# Patient Record
Sex: Male | Born: 1945 | Race: White | Hispanic: No | Marital: Married | Smoking: Former smoker
Health system: Southern US, Community
[De-identification: ages and names within clinical notes are randomized; demographics above are authoritative.]

## PROBLEM LIST (undated history)

## (undated) DIAGNOSIS — F319 Bipolar disorder, unspecified: Secondary | ICD-10-CM

## (undated) DIAGNOSIS — G4700 Insomnia, unspecified: Secondary | ICD-10-CM

## (undated) DIAGNOSIS — Z860100 Personal history of colon polyps, unspecified: Secondary | ICD-10-CM

## (undated) DIAGNOSIS — D62 Acute posthemorrhagic anemia: Secondary | ICD-10-CM

## (undated) DIAGNOSIS — R519 Headache, unspecified: Secondary | ICD-10-CM

## (undated) DIAGNOSIS — J45909 Unspecified asthma, uncomplicated: Secondary | ICD-10-CM

## (undated) DIAGNOSIS — R4586 Emotional lability: Secondary | ICD-10-CM

## (undated) DIAGNOSIS — I639 Cerebral infarction, unspecified: Secondary | ICD-10-CM

## (undated) DIAGNOSIS — I10 Essential (primary) hypertension: Secondary | ICD-10-CM

## (undated) DIAGNOSIS — Z8709 Personal history of other diseases of the respiratory system: Secondary | ICD-10-CM

## (undated) DIAGNOSIS — I6789 Other cerebrovascular disease: Secondary | ICD-10-CM

## (undated) DIAGNOSIS — R0602 Shortness of breath: Secondary | ICD-10-CM

## (undated) DIAGNOSIS — R251 Tremor, unspecified: Secondary | ICD-10-CM

## (undated) DIAGNOSIS — F419 Anxiety disorder, unspecified: Secondary | ICD-10-CM

## (undated) DIAGNOSIS — T783XXA Angioneurotic edema, initial encounter: Secondary | ICD-10-CM

## (undated) DIAGNOSIS — Z9981 Dependence on supplemental oxygen: Secondary | ICD-10-CM

## (undated) DIAGNOSIS — G8929 Other chronic pain: Secondary | ICD-10-CM

## (undated) DIAGNOSIS — C3490 Malignant neoplasm of unspecified part of unspecified bronchus or lung: Secondary | ICD-10-CM

## (undated) DIAGNOSIS — F32A Depression, unspecified: Secondary | ICD-10-CM

## (undated) DIAGNOSIS — G709 Myoneural disorder, unspecified: Secondary | ICD-10-CM

## (undated) DIAGNOSIS — I219 Acute myocardial infarction, unspecified: Secondary | ICD-10-CM

## (undated) DIAGNOSIS — K219 Gastro-esophageal reflux disease without esophagitis: Secondary | ICD-10-CM

## (undated) DIAGNOSIS — Z8546 Personal history of malignant neoplasm of prostate: Secondary | ICD-10-CM

## (undated) DIAGNOSIS — K5900 Constipation, unspecified: Secondary | ICD-10-CM

## (undated) DIAGNOSIS — Z8669 Personal history of other diseases of the nervous system and sense organs: Secondary | ICD-10-CM

## (undated) DIAGNOSIS — E785 Hyperlipidemia, unspecified: Secondary | ICD-10-CM

## (undated) DIAGNOSIS — J690 Pneumonitis due to inhalation of food and vomit: Secondary | ICD-10-CM

## (undated) DIAGNOSIS — J439 Emphysema, unspecified: Secondary | ICD-10-CM

## (undated) DIAGNOSIS — R51 Headache: Secondary | ICD-10-CM

## (undated) DIAGNOSIS — I2510 Atherosclerotic heart disease of native coronary artery without angina pectoris: Secondary | ICD-10-CM

## (undated) DIAGNOSIS — M549 Dorsalgia, unspecified: Secondary | ICD-10-CM

## (undated) DIAGNOSIS — F329 Major depressive disorder, single episode, unspecified: Secondary | ICD-10-CM

## (undated) DIAGNOSIS — R7881 Bacteremia: Secondary | ICD-10-CM

## (undated) HISTORY — DX: Essential (primary) hypertension: I10

## (undated) HISTORY — DX: Hyperlipidemia, unspecified: E78.5

## (undated) HISTORY — DX: Headache, unspecified: R51.9

## (undated) HISTORY — DX: Acute myocardial infarction, unspecified: I21.9

## (undated) HISTORY — DX: Malignant neoplasm of unspecified part of unspecified bronchus or lung: C34.90

## (undated) HISTORY — PX: DG BIOPSY LUNG: HXRAD146

## (undated) HISTORY — DX: Depression, unspecified: F32.A

## (undated) HISTORY — DX: Major depressive disorder, single episode, unspecified: F32.9

## (undated) HISTORY — DX: Tremor, unspecified: R25.1

## (undated) HISTORY — DX: Personal history of malignant neoplasm of prostate: Z85.46

## (undated) HISTORY — PX: COLONOSCOPY: SHX174

## (undated) HISTORY — DX: Cerebral infarction, unspecified: I63.9

## (undated) HISTORY — DX: Emphysema, unspecified: J43.9

## (undated) HISTORY — DX: Headache: R51

## (undated) HISTORY — PX: SPINE SURGERY: SHX786

## (undated) HISTORY — PX: BACK SURGERY: SHX140

---

## 1987-02-11 HISTORY — PX: HAND SURGERY: SHX662

## 1994-02-10 HISTORY — PX: CHOLECYSTECTOMY: SHX55

## 1996-02-11 DIAGNOSIS — G709 Myoneural disorder, unspecified: Secondary | ICD-10-CM

## 1996-02-11 DIAGNOSIS — I639 Cerebral infarction, unspecified: Secondary | ICD-10-CM

## 1996-02-11 HISTORY — PX: ULNAR NERVE REPAIR: SHX2594

## 1996-02-11 HISTORY — PX: CARPAL TUNNEL RELEASE: SHX101

## 1996-02-11 HISTORY — DX: Cerebral infarction, unspecified: I63.9

## 1996-02-11 HISTORY — DX: Myoneural disorder, unspecified: G70.9

## 1997-02-10 HISTORY — PX: BRAIN SURGERY: SHX531

## 1997-02-10 HISTORY — PX: CRANIOTOMY: SHX93

## 1997-07-18 ENCOUNTER — Ambulatory Visit (HOSPITAL_COMMUNITY): Admission: RE | Admit: 1997-07-18 | Discharge: 1997-07-18 | Payer: Self-pay

## 1997-07-25 ENCOUNTER — Ambulatory Visit (HOSPITAL_COMMUNITY): Admission: RE | Admit: 1997-07-25 | Discharge: 1997-07-25 | Payer: Self-pay | Admitting: Neurosurgery

## 1997-08-30 ENCOUNTER — Inpatient Hospital Stay (HOSPITAL_COMMUNITY): Admission: RE | Admit: 1997-08-30 | Discharge: 1997-09-01 | Payer: Self-pay | Admitting: Neurosurgery

## 1998-02-10 HISTORY — PX: CORONARY ANGIOPLASTY WITH STENT PLACEMENT: SHX49

## 1998-04-11 DIAGNOSIS — I219 Acute myocardial infarction, unspecified: Secondary | ICD-10-CM

## 1998-04-11 HISTORY — DX: Acute myocardial infarction, unspecified: I21.9

## 1998-04-11 HISTORY — PX: CORONARY ANGIOPLASTY WITH STENT PLACEMENT: SHX49

## 1998-04-15 ENCOUNTER — Inpatient Hospital Stay (HOSPITAL_COMMUNITY): Admission: EM | Admit: 1998-04-15 | Discharge: 1998-04-17 | Payer: Self-pay | Admitting: Emergency Medicine

## 1998-04-15 ENCOUNTER — Encounter: Payer: Self-pay | Admitting: Cardiovascular Disease

## 1998-05-01 ENCOUNTER — Encounter (HOSPITAL_COMMUNITY): Admission: RE | Admit: 1998-05-01 | Discharge: 1998-07-30 | Payer: Self-pay | Admitting: Cardiovascular Disease

## 2001-04-10 HISTORY — PX: PROSTATECTOMY: SHX69

## 2001-04-26 ENCOUNTER — Encounter: Payer: Self-pay | Admitting: Urology

## 2001-05-03 ENCOUNTER — Encounter (INDEPENDENT_AMBULATORY_CARE_PROVIDER_SITE_OTHER): Payer: Self-pay

## 2001-05-03 ENCOUNTER — Inpatient Hospital Stay (HOSPITAL_COMMUNITY): Admission: RE | Admit: 2001-05-03 | Discharge: 2001-05-06 | Payer: Self-pay | Admitting: Urology

## 2001-07-25 ENCOUNTER — Encounter: Payer: Self-pay | Admitting: Internal Medicine

## 2001-07-25 ENCOUNTER — Emergency Department (HOSPITAL_COMMUNITY): Admission: EM | Admit: 2001-07-25 | Discharge: 2001-07-26 | Payer: Self-pay

## 2001-07-26 ENCOUNTER — Ambulatory Visit (HOSPITAL_COMMUNITY): Admission: RE | Admit: 2001-07-26 | Discharge: 2001-07-26 | Payer: Self-pay | Admitting: Gastroenterology

## 2002-02-10 DIAGNOSIS — Z8546 Personal history of malignant neoplasm of prostate: Secondary | ICD-10-CM

## 2002-02-10 HISTORY — DX: Personal history of malignant neoplasm of prostate: Z85.46

## 2002-05-02 ENCOUNTER — Encounter: Admission: RE | Admit: 2002-05-02 | Discharge: 2002-05-02 | Payer: Self-pay | Admitting: Emergency Medicine

## 2002-05-02 ENCOUNTER — Encounter: Payer: Self-pay | Admitting: Emergency Medicine

## 2002-11-02 ENCOUNTER — Inpatient Hospital Stay (HOSPITAL_COMMUNITY): Admission: EM | Admit: 2002-11-02 | Discharge: 2002-11-06 | Payer: Self-pay | Admitting: Psychiatry

## 2002-11-22 ENCOUNTER — Encounter: Admission: RE | Admit: 2002-11-22 | Discharge: 2002-11-22 | Payer: Self-pay | Admitting: Emergency Medicine

## 2002-11-22 ENCOUNTER — Encounter: Payer: Self-pay | Admitting: Emergency Medicine

## 2003-02-28 ENCOUNTER — Encounter: Admission: RE | Admit: 2003-02-28 | Discharge: 2003-02-28 | Payer: Self-pay | Admitting: Emergency Medicine

## 2003-08-30 ENCOUNTER — Encounter: Admission: RE | Admit: 2003-08-30 | Discharge: 2003-08-30 | Payer: Self-pay | Admitting: Emergency Medicine

## 2003-09-04 ENCOUNTER — Encounter: Admission: RE | Admit: 2003-09-04 | Discharge: 2003-09-04 | Payer: Self-pay | Admitting: Emergency Medicine

## 2003-09-19 ENCOUNTER — Encounter: Admission: RE | Admit: 2003-09-19 | Discharge: 2003-09-19 | Payer: Self-pay | Admitting: Emergency Medicine

## 2003-09-27 ENCOUNTER — Encounter: Admission: RE | Admit: 2003-09-27 | Discharge: 2003-09-27 | Payer: Self-pay | Admitting: Emergency Medicine

## 2004-06-27 ENCOUNTER — Encounter: Admission: RE | Admit: 2004-06-27 | Discharge: 2004-06-27 | Payer: Self-pay | Admitting: Emergency Medicine

## 2004-07-28 ENCOUNTER — Encounter: Admission: RE | Admit: 2004-07-28 | Discharge: 2004-07-28 | Payer: Self-pay | Admitting: Neurosurgery

## 2004-08-14 ENCOUNTER — Inpatient Hospital Stay (HOSPITAL_COMMUNITY): Admission: RE | Admit: 2004-08-14 | Discharge: 2004-08-15 | Payer: Self-pay | Admitting: Neurosurgery

## 2004-12-20 ENCOUNTER — Encounter: Admission: RE | Admit: 2004-12-20 | Discharge: 2004-12-20 | Payer: Self-pay | Admitting: Emergency Medicine

## 2005-01-12 ENCOUNTER — Encounter: Admission: RE | Admit: 2005-01-12 | Discharge: 2005-01-12 | Payer: Self-pay | Admitting: Neurosurgery

## 2005-02-25 ENCOUNTER — Ambulatory Visit (HOSPITAL_COMMUNITY): Admission: RE | Admit: 2005-02-25 | Discharge: 2005-02-26 | Payer: Self-pay | Admitting: Neurosurgery

## 2005-09-17 ENCOUNTER — Encounter: Admission: RE | Admit: 2005-09-17 | Discharge: 2005-09-17 | Payer: Self-pay | Admitting: Emergency Medicine

## 2005-09-22 ENCOUNTER — Ambulatory Visit (HOSPITAL_COMMUNITY): Admission: RE | Admit: 2005-09-22 | Discharge: 2005-09-22 | Payer: Self-pay | Admitting: Neurology

## 2005-10-09 ENCOUNTER — Encounter: Admission: RE | Admit: 2005-10-09 | Discharge: 2005-10-09 | Payer: Self-pay | Admitting: Neurology

## 2006-02-07 ENCOUNTER — Encounter: Admission: RE | Admit: 2006-02-07 | Discharge: 2006-02-07 | Payer: Self-pay | Admitting: Emergency Medicine

## 2006-02-10 HISTORY — PX: GASTROSTOMY W/ FEEDING TUBE: SUR642

## 2006-04-24 ENCOUNTER — Inpatient Hospital Stay (HOSPITAL_COMMUNITY): Admission: RE | Admit: 2006-04-24 | Discharge: 2006-04-27 | Payer: Self-pay | Admitting: Neurosurgery

## 2006-04-27 ENCOUNTER — Ambulatory Visit: Payer: Self-pay | Admitting: Physical Medicine & Rehabilitation

## 2006-07-02 ENCOUNTER — Encounter: Admission: RE | Admit: 2006-07-02 | Discharge: 2006-07-02 | Payer: Self-pay | Admitting: Neurosurgery

## 2006-08-20 ENCOUNTER — Encounter: Admission: RE | Admit: 2006-08-20 | Discharge: 2006-08-20 | Payer: Self-pay | Admitting: Gastroenterology

## 2006-08-24 ENCOUNTER — Ambulatory Visit (HOSPITAL_COMMUNITY): Admission: RE | Admit: 2006-08-24 | Discharge: 2006-08-24 | Payer: Self-pay | Admitting: Gastroenterology

## 2006-09-30 ENCOUNTER — Inpatient Hospital Stay (HOSPITAL_COMMUNITY): Admission: RE | Admit: 2006-09-30 | Discharge: 2006-10-03 | Payer: Self-pay | Admitting: Psychiatry

## 2006-09-30 ENCOUNTER — Ambulatory Visit: Payer: Self-pay | Admitting: Psychiatry

## 2006-11-03 ENCOUNTER — Ambulatory Visit (HOSPITAL_COMMUNITY): Admission: RE | Admit: 2006-11-03 | Discharge: 2006-11-05 | Payer: Self-pay | Admitting: Gastroenterology

## 2007-02-23 ENCOUNTER — Inpatient Hospital Stay (HOSPITAL_COMMUNITY): Admission: EM | Admit: 2007-02-23 | Discharge: 2007-02-25 | Payer: Self-pay | Admitting: Emergency Medicine

## 2007-04-13 ENCOUNTER — Inpatient Hospital Stay (HOSPITAL_COMMUNITY): Admission: EM | Admit: 2007-04-13 | Discharge: 2007-04-15 | Payer: Self-pay | Admitting: Emergency Medicine

## 2007-06-02 ENCOUNTER — Encounter: Admission: RE | Admit: 2007-06-02 | Discharge: 2007-06-02 | Payer: Self-pay | Admitting: Neurosurgery

## 2007-06-25 ENCOUNTER — Inpatient Hospital Stay (HOSPITAL_COMMUNITY): Admission: RE | Admit: 2007-06-25 | Discharge: 2007-06-30 | Payer: Self-pay | Admitting: Neurosurgery

## 2007-06-28 ENCOUNTER — Ambulatory Visit: Payer: Self-pay | Admitting: Physical Medicine & Rehabilitation

## 2007-11-03 ENCOUNTER — Encounter: Admission: RE | Admit: 2007-11-03 | Discharge: 2007-12-20 | Payer: Self-pay | Admitting: Neurosurgery

## 2008-02-11 DIAGNOSIS — J690 Pneumonitis due to inhalation of food and vomit: Secondary | ICD-10-CM

## 2008-02-11 HISTORY — DX: Pneumonitis due to inhalation of food and vomit: J69.0

## 2008-04-11 ENCOUNTER — Observation Stay (HOSPITAL_COMMUNITY): Admission: AD | Admit: 2008-04-11 | Discharge: 2008-04-12 | Payer: Self-pay | Admitting: Gastroenterology

## 2008-04-12 ENCOUNTER — Encounter (INDEPENDENT_AMBULATORY_CARE_PROVIDER_SITE_OTHER): Payer: Self-pay | Admitting: Gastroenterology

## 2008-08-16 ENCOUNTER — Encounter: Admission: RE | Admit: 2008-08-16 | Discharge: 2008-08-16 | Payer: Self-pay | Admitting: Emergency Medicine

## 2009-04-04 ENCOUNTER — Encounter: Admission: RE | Admit: 2009-04-04 | Discharge: 2009-04-04 | Payer: Self-pay | Admitting: Neurosurgery

## 2009-05-08 ENCOUNTER — Encounter: Payer: Self-pay | Admitting: Pulmonary Disease

## 2009-05-15 ENCOUNTER — Encounter: Payer: Self-pay | Admitting: Pulmonary Disease

## 2009-07-16 ENCOUNTER — Inpatient Hospital Stay (HOSPITAL_COMMUNITY): Admission: RE | Admit: 2009-07-16 | Discharge: 2009-07-18 | Payer: Self-pay | Admitting: Neurosurgery

## 2009-08-22 ENCOUNTER — Encounter: Admission: RE | Admit: 2009-08-22 | Discharge: 2009-08-22 | Payer: Self-pay | Admitting: Neurosurgery

## 2009-10-18 ENCOUNTER — Encounter (INDEPENDENT_AMBULATORY_CARE_PROVIDER_SITE_OTHER): Payer: Self-pay | Admitting: *Deleted

## 2009-10-18 LAB — CONVERTED CEMR LAB
Albumin: 4.6 g/dL
Alkaline Phosphatase: 61 units/L
Calcium: 9.6 mg/dL
Creatinine, Ser: 0.87 mg/dL
HDL: 65 mg/dL
LDL Cholesterol: 106 mg/dL
PSA: 0 ng/mL
Potassium, serum: 4.6 mmol/L
Sodium, serum: 135 mmol/L
Total Protein: 7.1 g/dL

## 2009-12-10 ENCOUNTER — Encounter: Payer: Self-pay | Admitting: Pulmonary Disease

## 2009-12-10 ENCOUNTER — Encounter (INDEPENDENT_AMBULATORY_CARE_PROVIDER_SITE_OTHER): Payer: Self-pay | Admitting: *Deleted

## 2009-12-10 ENCOUNTER — Encounter: Payer: Self-pay | Admitting: Family Medicine

## 2009-12-10 ENCOUNTER — Encounter: Admission: RE | Admit: 2009-12-10 | Discharge: 2009-12-10 | Payer: Self-pay | Admitting: Emergency Medicine

## 2009-12-10 LAB — CONVERTED CEMR LAB
ALT: 18 units/L
Albumin: 4.3 g/dL
Alkaline Phosphatase: 52 units/L
BUN: 0.91 mg/dL
CO2, serum: 29 mmol/L
Chloride, Serum: 98 mmol/L
Creatinine, Ser: 11 mg/dL
MCV: 88.4 fL
Potassium, serum: 5.4 mmol/L
RBC count: 4.84 10*6/uL
Sodium, serum: 135 mmol/L
Total Bilirubin: 0.3 mg/dL
WBC, blood: 11.5 10*3/uL

## 2010-01-02 ENCOUNTER — Encounter: Payer: Self-pay | Admitting: Pulmonary Disease

## 2010-01-09 ENCOUNTER — Encounter: Payer: Self-pay | Admitting: Pulmonary Disease

## 2010-02-13 ENCOUNTER — Ambulatory Visit
Admission: RE | Admit: 2010-02-13 | Discharge: 2010-02-13 | Payer: Self-pay | Source: Home / Self Care | Attending: Family Medicine | Admitting: Family Medicine

## 2010-02-13 DIAGNOSIS — R51 Headache: Secondary | ICD-10-CM

## 2010-02-13 DIAGNOSIS — F329 Major depressive disorder, single episode, unspecified: Secondary | ICD-10-CM | POA: Insufficient documentation

## 2010-02-13 DIAGNOSIS — J45909 Unspecified asthma, uncomplicated: Secondary | ICD-10-CM | POA: Insufficient documentation

## 2010-02-13 DIAGNOSIS — Z8546 Personal history of malignant neoplasm of prostate: Secondary | ICD-10-CM | POA: Insufficient documentation

## 2010-02-13 DIAGNOSIS — B379 Candidiasis, unspecified: Secondary | ICD-10-CM | POA: Insufficient documentation

## 2010-02-13 DIAGNOSIS — Z8673 Personal history of transient ischemic attack (TIA), and cerebral infarction without residual deficits: Secondary | ICD-10-CM | POA: Insufficient documentation

## 2010-02-13 DIAGNOSIS — E785 Hyperlipidemia, unspecified: Secondary | ICD-10-CM | POA: Insufficient documentation

## 2010-02-13 DIAGNOSIS — I10 Essential (primary) hypertension: Secondary | ICD-10-CM | POA: Insufficient documentation

## 2010-02-13 DIAGNOSIS — F319 Bipolar disorder, unspecified: Secondary | ICD-10-CM | POA: Insufficient documentation

## 2010-02-13 DIAGNOSIS — R259 Unspecified abnormal involuntary movements: Secondary | ICD-10-CM | POA: Insufficient documentation

## 2010-02-20 ENCOUNTER — Ambulatory Visit
Admission: RE | Admit: 2010-02-20 | Discharge: 2010-02-20 | Payer: Self-pay | Source: Home / Self Care | Attending: Pulmonary Disease | Admitting: Pulmonary Disease

## 2010-02-20 DIAGNOSIS — I6789 Other cerebrovascular disease: Secondary | ICD-10-CM | POA: Insufficient documentation

## 2010-02-20 DIAGNOSIS — J438 Other emphysema: Secondary | ICD-10-CM | POA: Insufficient documentation

## 2010-02-20 DIAGNOSIS — I219 Acute myocardial infarction, unspecified: Secondary | ICD-10-CM | POA: Insufficient documentation

## 2010-02-20 HISTORY — DX: Other cerebrovascular disease: I67.89

## 2010-02-25 ENCOUNTER — Telehealth: Payer: Self-pay | Admitting: Pulmonary Disease

## 2010-02-26 ENCOUNTER — Encounter: Payer: Self-pay | Admitting: Pulmonary Disease

## 2010-03-03 ENCOUNTER — Encounter: Payer: Self-pay | Admitting: Emergency Medicine

## 2010-03-05 ENCOUNTER — Telehealth (INDEPENDENT_AMBULATORY_CARE_PROVIDER_SITE_OTHER): Payer: Self-pay | Admitting: *Deleted

## 2010-03-08 ENCOUNTER — Encounter: Payer: Self-pay | Admitting: Pulmonary Disease

## 2010-03-11 ENCOUNTER — Telehealth: Payer: Self-pay | Admitting: Pulmonary Disease

## 2010-03-14 NOTE — Letter (Signed)
Summary: Leslee Home MD  Leslee Home MD   Imported By: Sherian Rein 02/26/2010 12:37:18  _____________________________________________________________________  External Attachment:    Type:   Image     Comment:   External Document

## 2010-03-14 NOTE — Assessment & Plan Note (Signed)
Summary: consult for management of emphysema   Visit Type:  Initial Consult Copy to:  Leslee Home MD/ Garnette Scheuermann MD Primary Provider/Referring Provider:  Neena Rhymes MD  CC:  Pulmonary Consult. pt c/o cough w/ occas yellow to brown phlem, congestion, and sob w/ activity x 3 months.  History of Present Illness: The pt is a 64y/o male who I have been asked to see for management of copd.   He has known severe obstructive disease from spirometry on 04/2009, and unfortunately continues to smoke.  He is being treated with spiriva, symbicort, and as needed nebulized bronchodilators.  He has chronic cough with mucus that varies from clear to discolored, along with chronic chest congestion.  The pt gets sob with any exertion, and his wife states that he "lives in a recliner".  The pt has severe chronic back pain along with sigificant LE weakness, and this interferes with his ambulatory tolerance.  He has rare sob at rest.  He has had a recent stress test with Dr. Katrinka Blazing, and tells me it was ok.  He has had a recent cxr in Oct which showed hyperinflation but no acute process.  Preventive Screening-Counseling & Management  Alcohol-Tobacco     Smoking Status: current     Smoking Cessation Counseling: yes     Packs/Day: 1.0     Tobacco Counseling: to quit use of tobacco products  Current Medications (verified): 1)  Ipratropium-Albuterol 0.5-2.5 (3) Mg/5ml Soln (Ipratropium-Albuterol) .... Use Once Via Nebullizer 4 Times A Day 2)  Ventolin Hfa 108 (90 Base) Mcg/act Aers (Albuterol Sulfate) .... Inhale 2 Puffs Qid As Needed 3)  Vesicare 5 Mg Tabs (Solifenacin Succinate) .... Take One Tablet Daily 4)  Hydrocodone-Acetaminophen 5-500 Mg Tabs (Hydrocodone-Acetaminophen) .... Take One Tablet 4 Times A Daily As Needed 5)  Symbicort 160-4.5 Mcg/act Aero (Budesonide-Formoterol Fumarate) .... Use Daily 6)  Spiriva Handihaler 18 Mcg Caps (Tiotropium Bromide Monohydrate) .... Inhale The Contents of 1 Capsule  Daily 7)  Fluticasone Propionate 50 Mcg/act Susp (Fluticasone Propionate) .... Use 2 Sprays Into Nostril Daily 8)  Lactulose 10 Gm/42ml Soln (Lactulose) .... Take One Teaspoonful Three Times A Day 9)  Vitamin D3 1000 Unit Caps (Cholecalciferol) .... Take One Tablet Daily 10)  Omeprazole 40 Mg Cpdr (Omeprazole) .... Take One Tablet Two Times A Day 11)  Hydrocodone-Acetaminophen 10-325 Mg/52ml Soln (Hydrocodone-Acetaminophen) .... Use 1 Teaspoonful Every 12 Hours As Needed 12)  Metoprolol Succinate 100 Mg Xr24h-Tab (Metoprolol Succinate) .... Take One Tablet Daily 13)  Diazepam 5 Mg Tabs (Diazepam) .... Take 1-2 Tablets By Mouth Every 6 Hours As Needed 14)  Prednisone 10 Mg Tabs (Prednisone) .... Take One Tablet Daily 15)  Divalproex Sodium 500 Mg Xr24h-Tab (Divalproex Sodium) .... Take 2 Tablets At Bedtime 16)  Vitamin C 500 Mg Tabs (Ascorbic Acid) .... Take One Tablet Daily 17)  Primidone 250 Mg Tabs (Primidone) .... Take One Tablet Two Times A Day 18)  Fish Oil 1000 Mg Caps (Omega-3 Fatty Acids) .... Take One Tablet Daily 19)  Zinc 50 Mg Tabs (Zinc) .... Take One Tablet Daily 20)  Abilify 10 Mg Tabs (Aripiprazole) .... Take 1/2-1 Tablet Daily 21)  Simvastatin 20 Mg Tabs (Simvastatin) .... Take One Tablet At Bedtime 22)  Trazodone Hcl 100 Mg Tabs (Trazodone Hcl) .... Take One Tablet At Bedtime 23)  Oxycodone-Acetaminophen 5-325 Mg Tabs (Oxycodone-Acetaminophen) .... Take One Tablet Every 6 Hours As Needed 24)  Px Enteric Aspirin 81 Mg Tbec (Aspirin) .... Tablet Daily 25)  Paroxetine Hcl  20 Mg Tabs (Paroxetine Hcl) .... Take 3 Tablets Daily 26)  Miconazole Nitrate 2 % Crea (Miconazole Nitrate) .... Apply To Affected Area Two Times A Day As Needed For Rash 27)  Nystatin 100000 Unit/gm Powd (Nystatin) .... Apply To Affected Area Two Times A Day.  Disp 1 Container 28)  Prednisone 10 Mg Tabs (Prednisone) .... Once Daily 29)  Doxycycline Hyclate 100 Mg Tabs (Doxycycline Hyclate) .Marland Kitchen.. 1 Two Times A  Day  Allergies (verified): No Known Drug Allergies  Past History:  Past Medical History:   Hx of MYOCARDIAL INFARCTION (ICD-410.90) C V A / STROKE (ICD-436) HEADACHE, CHRONIC, HX OF (ICD-V15.9) TREMOR (ICD-781.0) CEREBROVASCULAR ACCIDENT, HX OF (ICD-V12.50) HYPERTENSION (ICD-401.9) HYPERLIPIDEMIA (ICD-272.4) DEPRESSION (ICD-311) Severe emphysema---FEV1 47% pred, FEV1% 49 (04/2009)  hx of prostate cancer  Past Surgical History: back surgery x 5 Cholecystectomy  moved left arm ulna nerve over craniotomy to clip aneurism vascular stent prostate cancer removed  Family History: Reviewed history and no changes required. emphysema: mother heart disease: mother brain cancer: father prostate cancer: father  Social History: Reviewed history and no changes required. married Patient is a current smoker. 1 ppd. 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  The patient denies shortness of breath at rest, coughing up blood, irregular heartbeats, loss of appetite, abdominal pain, sore throat, tooth/dental problems, headaches, sneezing, itching, hand/feet swelling, and fever.    Vital Signs:  Patient profile:   65 year old male Height:      69 inches Weight:      192 pounds BMI:     28.46 O2 Sat:      94 % on Room air Temp:     98 degrees F oral Pulse rate:   89 / minute BP sitting:   134 / 80  (left arm) Cuff size:   large  Vitals Entered By: Carver Fila (February 20, 2010 3:22 PM)  O2 Flow:  Room air CC: Pulmonary Consult. pt c/o cough w/ occas yellow to brown phlem,  congestion, sob w/ activity x 3 months Comments meds and allergies updated Phone number updated Carver Fila  February 20, 2010 3:21 PM    Physical Exam  General:  wd male in nad Eyes:  PERRLA and EOMI.   Nose:  patent without discharge, no purulence seen Mouth:  no exudates or lesions seen. Neck:  no jvd, tmg, LN Lungs:  scatttered rhonchi, no wheezing, a few crackles in bases. Heart:  rrr, 2/6 sem Abdomen:  soft and nontender, bs+ Extremities:  mild ankle edema, no cyanosis  pulses decreased distally, but present Neurologic:  alert and oriented,moves all 4.   Impression & Recommendations:  Problem # 1:  EMPHYSEMA (ICD-492.8)  the pt has severe emphysema by his spirometry in March of last year, and unfortunately continues to smoke.  He is continuing to have cough with mucus production, and I have told him this will continue to be an issue as long as he smokes.  He describes severe sob with trying to walk thru his home, but he is severely debilitated with chronic back problems and leg weakness to the point of barely being able  to push up his own weight.  He is on a great bronchodilator regimen, and I would make no changes currently.  He is not able to benefit from pulmonary rehab due to his chronic disabilities.  My only question is whether he would benefit from oxygen.  His sats today are adequate at rest, and it is unclear whether he is even ambulatory enough to benefit from ambulatory oxygen???.  Will at least check his sats at [employer], since prolonged desats at night may adversely affect his cardiopulmonary hemodynamics.  There is really not a lot more to do for the patient, except to continue to encourage him to stop smoking.  He has nicotine patches, but is scared to take wellbutrin or chantix due to his underlying psychiatric history.    Medications Added to Medication List This Visit: 1)  Prednisone 10 Mg Tabs (Prednisone) .... Once daily 2)  Doxycycline Hyclate 100 Mg Tabs (Doxycycline  hyclate) .Marland Kitchen.. 1 two times a day  Other Orders: Consultation Level IV (04540) DME Referral (DME) Tobacco use cessation intermediate 3-10 minutes (98119)  Patient Instructions: 1)  continue with spiriva and symbicort daily, and use nebulizer machine only for "bad days" and for rescue 2)  stop smoking 100%! 3)  will check oxygen level overnight, and call you with results.   Immunization History:  Influenza Immunization History:    Influenza:  historical (11/10/2009)

## 2010-03-14 NOTE — Progress Notes (Signed)
Summary: ONO results  Phone Note Call from Patient   Caller: Patient Call For: DR So [facility] Summary of Call: Patient phoned he would like the results from last Monday. Patient can be reached at [phone] Initial call taken by: Vedia Coffer,  March 05, 2010 2:50 PM  Follow-up for Phone Call        Pt requesting results of ONO done last week.  Pt did not know what company done this but I think it may have been [facility].  Aundra Millet, have you seen these results? **Pt aware KC out of office this evening. Gweneth Dimitri RN  March 05, 2010 3:00 PM   Additional Follow-up for Phone Call Additional follow up Details #1::        pt's Lindell Spar is in kc's very important look at folder.  Aundra Millet Reynolds LPN  March 06, 2010 9:21 AM     Additional Follow-up for Phone Call Additional follow up Details #2::    let him know he spent about less than 88% during the night...this is enough to qualify for oxygen at night.  will send an order to [facility] to get for him. Follow-up by: Barbaraann Share MD,  March 06, 2010 4:53 PM  Additional Follow-up for Phone Call Additional follow up Details #3:: Details for Additional Follow-up Action Taken: called and spoke with pt and he verbalized understanding of the ono results. pt also aware someone will be contatcing him about getting his o2 set up for at night. Carver Fila  March 06, 2010 5:07 PM

## 2010-03-14 NOTE — Letter (Signed)
Summary: [name] MD/[facility]  [name] MD/[facility]   Imported By: Lester [place] 02/28/2010 09:14:18  _____________________________________________________________________  External Attachment:    Type:   Image     Comment:   External Document

## 2010-03-14 NOTE — Progress Notes (Signed)
Summary: waiting on ONO  Phone Note Call from Patient   Caller: Patient Call For: clance Summary of Call: pt hasn't heard from DME re: ONO. pt # [id number] Initial call taken by: Tivis Ringer, CNA,  February 25, 2010 11:46 AM  Follow-up for Phone Call        Explained to pt that any dme company will have to get an outside source to arrange the ono. I have refaxed the order to [facility] to give pt a call and at least let him know that they have recvd. order and to arrange ono. Pt voiced understanding and will let us know if he doesn't hear from them. Rhonda Cobb  February 25, 2010 12:07 PM

## 2010-03-14 NOTE — Assessment & Plan Note (Signed)
Summary: new pt to establish///sph   Vital Signs:  Patient profile:   65 year old male Weight:      196 pounds O2 Sat:      90 % on Room air Pulse rate:   100 / minute BP sitting:   140 / 90  (left arm)  Vitals Entered By: Doristine Devoid CMA (February 13, 2010 10:27 AM)  O2 Flow:  Room air CC: NEW EST- rash on chest and groin   History of Present Illness: 66 yo man here today to establish care.  previous MD- Malik Zuniga.  COPD- has appt on 1/11 w/ pulmonary.  currently on Spiriva, Ventolin, Symbicort.  not O2 requiring.  feels sxs are pretty well controlled on current meds.  no SOB at rest.  + SOB w/ exertion.  CAD- s/p stenting.  recent stress test was 'good'.  follows w/ Dr Malik Zuniga.  has frequent CP.  considering cath after seeing pulmonary.  HTN- controlled w/ meds.  + SOB, CP.  no edema.  Hyperlipidemia- had labs recently.  no myalgias or abd pain  Prostate Cancer- s/p prostatectomy w/ Dr Malik Zuniga.  having yearly PSA tests.  TIA- has seen Dr Malik Zuniga in the past, goal is to control risk factors.  Bipolar- seeing Dr Malik Zuniga.  sxs well controlled  rash- located in groin and inframammary crease.  has recently been on a number of abx.  still tapering prednisone.  Preventive Screening-Counseling & Management  Alcohol-Tobacco     Alcohol drinks/day: 0     Smoking Status: current     Smoking Cessation Counseling: yes     Smoke Cessation Stage: contemplative     Packs/Day: 1.0  Caffeine-Diet-Exercise     Does Patient Exercise: no      Sexual History:  currently monogamous.        Drug Use:  never.    Current Medications (verified): 1)  Ipratropium-Albuterol 0.5-2.5 (3) Mg/43ml Soln (Ipratropium-Albuterol) .... Use Once Via Nebullizer 4 Times A Day 2)  Ventolin Hfa 108 (90 Base) Mcg/act Aers (Albuterol Sulfate) .... Inhale 2 Puffs Qid As Needed 3)  Vesicare 5 Mg Tabs (Solifenacin Succinate) .... Take One Tablet Daily 4)  Hydrocodone-Acetaminophen 5-500 Mg Tabs  (Hydrocodone-Acetaminophen) .... Take One Tablet 4 Times A Daily As Needed 5)  Symbicort 160-4.5 Mcg/act Aero (Budesonide-Formoterol Fumarate) .... Use Daily 6)  Spiriva Handihaler 18 Mcg Caps (Tiotropium Bromide Monohydrate) .... Inhale The Contents of 1 Capsule Daily 7)  Fluticasone Propionate 50 Mcg/act Susp (Fluticasone Propionate) .... Use 2 Sprays Into Nostril Daily 8)  Lactulose 10 Gm/17ml Soln (Lactulose) .... Take One Teaspoonful Three Times A Day 9)  Vitamin D3 1000 Unit Caps (Cholecalciferol) .... Take One Tablet Daily 10)  Omeprazole 40 Mg Cpdr (Omeprazole) .... Take One Tablet Two Times A Day 11)  Hydrocodone-Acetaminophen 10-325 Mg/6ml Soln (Hydrocodone-Acetaminophen) .... Use 1 Teaspoonful Every 12 Hours As Needed 12)  Metoprolol Succinate 100 Mg Xr24h-Tab (Metoprolol Succinate) .... Take One Tablet Daily 13)  Diazepam 5 Mg Tabs (Diazepam) .... Take 1-2 Tablets By Mouth Every 6 Hours As Needed 14)  Prednisone 10 Mg Tabs (Prednisone) .... Take One Tablet Daily 15)  Divalproex Sodium 500 Mg Xr24h-Tab (Divalproex Sodium) .... Take 2 Tablets At Bedtime 16)  Vitamin C 500 Mg Tabs (Ascorbic Acid) .... Take One Tablet Daily 17)  Primidone 250 Mg Tabs (Primidone) .... Take One Tablet Two Times A Day 18)  Fish Oil 1000 Mg Caps (Omega-3 Fatty Acids) .... Take One Tablet Daily 19)  Zinc  50 Mg Tabs (Zinc) .... Take One Tablet Daily 20)  Abilify 10 Mg Tabs (Aripiprazole) .... Take 1/2-1 Tablet Daily 21)  Simvastatin 20 Mg Tabs (Simvastatin) .... Take One Tablet At Bedtime 22)  Trazodone Hcl 100 Mg Tabs (Trazodone Hcl) .... Take One Tablet At Bedtime 23)  Oxycodone-Acetaminophen 5-325 Mg Tabs (Oxycodone-Acetaminophen) .... Take One Tablet Every 6 Hours As Needed 24)  Px Enteric Aspirin 81 Mg Tbec (Aspirin) .... Tablet Daily 25)  Paroxetine Hcl 20 Mg Tabs (Paroxetine Hcl) .... Take 3 Tablets Daily 26)  Miconazole Nitrate 2 % Crea (Miconazole Nitrate) .... Apply To Affected Area Two Times A Day  As Needed For Rash 27)  Nystatin 100000 Unit/gm Powd (Nystatin) .... Apply To Affected Area Two Times A Day.  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 Vacular Stent Prostatectomy-2' cancer-2004 Pneumonia Lumbar fusion x3 Lumbar disc ruptured   Family History: CAD-mother HTN-mother,father DM-no COLON CA-no STROKE-no PROSTATE CA-no  Social History: married son lives in [place], daughter lives in [place] former tool and Scientist, forensic- now disabledDoes Patient Exercise:  no Smoking Status:  current Packs/Day:  1.0 Sexual History:  currently monogamous Drug Use:  never  Review of Systems      See HPI  Physical Exam  General:  Well-developed,well-nourished,in no acute distress; alert,appropriate and cooperative throughout examination Head:  NCAT Neck:  No deformities, masses, or tenderness noted. Lungs:  CTAB, decreased BS Heart:  reg S1/S2 Abdomen:  soft, NT/ND, +BS Pulses:  +2 carotid, radial, DP Extremities:  no C/C/E Neurologic:  alert & oriented X3, cranial nerves II-XII intact, gait normal, and DTRs symmetrical and normal.   Skin:  erythematous rash in inframammary and groin creases w/ sattelite lesions Cervical Nodes:  No lymphadenopathy noted Psych:  Oriented X3, memory intact for recent and remote, normally interactive, good eye contact, not anxious appearing, and not depressed appearing.     Impression & Recommendations:  Problem # 1:  EMPHYSEMA (ICD-492.8) Assessment New has appt w/ pulm upcoming.  has SOB w/ exertion.  already on multiple meds so will defer any changes in treatment to pulm.  Problem # 2:  Hx of MYOCARDIAL INFARCTION  (ICD-410.90) Assessment: New pt following regularly w/ cards.  goal is risk reduction. His updated medication list for this problem includes:    Metoprolol Succinate 100 Mg Xr24h-tab (Metoprolol succinate) .Marland Kitchen... Take one tablet daily    Px Enteric Aspirin 81 Mg Tbec (Aspirin) .Marland Kitchen... Tablet daily  Problem # 3:  HYPERTENSION (ICD-401.9) Assessment: New BP adequate but not ideal today.  pt admits to being nervous for 1st appt.  will follow at subsequent visits and adjust as needed. His updated medication list for this problem includes:    Metoprolol Succinate 100 Mg Xr24h-tab (Metoprolol succinate) .Marland Kitchen... Take one tablet daily  Problem # 4:  HYPERLIPIDEMIA (ICD-272.4) Assessment: New due for labs in march.  tolerating meds w/out difficulty. His updated medication list for this problem includes:    Simvastatin 20 Mg Tabs (Simvastatin) .Marland Kitchen... Take one tablet at bedtime  Problem # 5:  PROSTATE CANCER, HX OF (ICD-V10.46) Assessment: New follows regularly w/ Dr Malik Zuniga.  Problem # 6:  CEREBROVASCULAR ACCIDENT, HX OF (ICD-V12.50) Assessment: New has seen neuro.  goal is risk reduction.  Problem # 7:  BIPOLAR DISORDER UNSPECIFIED (ICD-296.80) Assessment: New follows w/ psych.  Problem # 8:  CANDIDIASIS (ICD-112.9) Assessment: New  likely due to recent abx and steroid use.  start antifungal creams to heal area and then use antifungal powder to prevent reoccurence.  Orders: Prescription Created Electronically [phone])  Complete Medication List: 1)  Ipratropium-albuterol 0.5-2.5 (3) Mg/67ml Soln (Ipratropium-albuterol) .... Use once via nebullizer 4 times a day 2)  Ventolin Hfa 108 (90 Base) Mcg/act Aers (Albuterol sulfate) .... Inhale 2 puffs qid as needed 3)  Vesicare 5 Mg Tabs (Solifenacin succinate) .... Take one tablet daily 4)  Hydrocodone-acetaminophen 5-500 Mg Tabs (Hydrocodone-acetaminophen) .... Take one tablet 4 times a daily as needed 5)  Symbicort 160-4.5 Mcg/act Aero  (Budesonide-formoterol fumarate) .... Use daily 6)  Spiriva Handihaler 18 Mcg Caps (Tiotropium bromide monohydrate) .... Inhale the contents of 1 capsule daily 7)  Fluticasone Propionate 50 Mcg/act Susp (Fluticasone propionate) .... Use 2 sprays into nostril daily 8)  Lactulose 10 Gm/10ml Soln (Lactulose) .... Take one teaspoonful three times a day 9)  Vitamin D3 1000 Unit Caps (Cholecalciferol) .... Take one tablet daily 10)  Omeprazole 40 Mg Cpdr (Omeprazole) .... Take one tablet two times a day 11)  Hydrocodone-acetaminophen 10-325 Mg/63ml Soln (Hydrocodone-acetaminophen) .... Use 1 teaspoonful every 12 hours as needed 12)  Metoprolol Succinate 100 Mg Xr24h-tab (Metoprolol succinate) .... Take one tablet daily 13)  Diazepam 5 Mg Tabs (Diazepam) .... Take 1-2 tablets by mouth every 6 hours as needed 14)  Prednisone 10 Mg Tabs (Prednisone) .... Take one tablet daily 15)  Divalproex Sodium 500 Mg Xr24h-tab (Divalproex sodium) .... Take 2 tablets at bedtime 16)  Vitamin C 500 Mg Tabs (Ascorbic acid) .... Take one tablet daily 17)  Primidone 250 Mg Tabs (Primidone) .... Take one tablet two times a day 18)  Fish Oil 1000 Mg Caps (Omega-3 fatty acids) .... Take one tablet daily 19)  Zinc 50 Mg Tabs (Zinc) .... Take one tablet daily 20)  Abilify 10 Mg Tabs (Aripiprazole) .... Take 1/2-1 tablet daily 21)  Simvastatin 20 Mg Tabs (Simvastatin) .... Take one tablet at bedtime 22)  Trazodone Hcl 100 Mg Tabs (Trazodone hcl) .... Take one tablet at bedtime 23)  Oxycodone-acetaminophen 5-325 Mg Tabs (Oxycodone-acetaminophen) .... Take one tablet every 6 hours as needed 24)  Px Enteric Aspirin 81 Mg Tbec (Aspirin) .... Tablet daily 25)  Paroxetine Hcl 20 Mg Tabs (Paroxetine hcl) .... Take 3 tablets daily 26)  Miconazole Nitrate 2 % Crea (Miconazole nitrate) .... Apply to affected area two times a day as needed for rash 27)  Nystatin 100000 Unit/gm Powd (Nystatin) .... Apply to affected area two times a day.   disp 1 container 28)  Prednisone 10 Mg Tabs (Prednisone) .... Once daily 29)  Doxycycline Hyclate 100 Mg Tabs (Doxycycline hyclate) .Marland Kitchen.. 1 two times a day  Patient Instructions: 1)  Please schedule your complete physical for March- do not eat before this appt 2)  Use the Miconazole cream on the rash until symptoms improve (check with the pharmacy which is cheaper- prescription vs over the counter) 3)  In between applications of the Miconazole cream use a barrier cream (diaper cream) to protect the area 4)  Once the area is almost healed you can use the Nystatin Powder to keep the area dry and rash free 5)  Call with any questions or concerns 6)  Welcome!  We're glad to have you! Prescriptions: NYSTATIN 100000 UNIT/GM POWD (NYSTATIN) apply to affected area two times a  day.  disp 1 container  #1 x 3   Entered and Authorized by:   Neena Rhymes MD   Signed by:   Neena Rhymes MD on 02/13/2010   Method used:   Electronically to        [facility]* (retail)       [address] [place], [place]  [address]       Ph: [phone] or [phone]       Fax: [phone]   RxID:   [id number] MICONAZOLE NITRATE 2 % CREA (MICONAZOLE NITRATE) apply to affected area two times a day as needed for rash  #1 tube x 3   Entered and Authorized by:   Neena Rhymes MD   Signed by:   Neena Rhymes MD on 02/13/2010   Method used:   Electronically to        [facility]* (retail)       [address] [place], [place]  [address]       Ph: [phone] or [phone]       Fax: [phone]   RxID:   [id number]    Orders Added: 1)  New Patient Level IV [85462] 2)  Prescription Created Electronically [phone]

## 2010-03-15 ENCOUNTER — Encounter (INDEPENDENT_AMBULATORY_CARE_PROVIDER_SITE_OTHER): Payer: Self-pay | Admitting: *Deleted

## 2010-03-20 ENCOUNTER — Encounter: Payer: Self-pay | Admitting: Pulmonary Disease

## 2010-03-20 NOTE — Progress Notes (Signed)
Summary: wants to know when expect his O2 and returning a call  Phone Note Call from Patient   Caller: spouse/cathleen Call For: dr Shyloh Derosa Summary of Call: patients wife phoned for [facility] regarding Mr. Welliver wanted to know when he can expect to recieve his O2. He cant remember the company that was to supply the O2. Also stated that they were returning a call to our office. They can be reached at [phone] Initial call taken by: Vedia Coffer,  March 11, 2010 11:36 AM  Follow-up for Phone Call        Called and spoke with pt.  He states that he has still not heard anything about getting set up on o2.  I advised that we sent order to [facility] on 03/06/10 for his o2.  He states that he got a call from "some o2 company" stating that he needs to have another test done, but does not recall the name of the test needed or the place that called him.  He states that he is positive that it was not [facility].  I will forward to [facility] for help. Thanks Follow-up by: Vernie Murders,  March 11, 2010 2:44 PM  Additional Follow-up for Phone Call Additional follow up Details #1::        Called [facility] and they did the ono on pt to see if he would be eligible for o2. Pt did quailify, but according to Medicare guidelines pt must be tested by an independent lab. This independent study has been scheduled for 03/12/10. [facility] stated that they could take the concentrator out there for pt to use until they get him quailified and would call and explain this to the patient. I asked that [facility] do this. I explained to [facility] that when we send an order over for an ono it is to quailify the patient for o2. Therefore, this test should not be arranged by them only an independent lab to avoid confusion to the patient as well as the providers. Rhonda Cobb  March 11, 2010 3:24 PM Called and spoke with pt and pt is aware that [facility] will deliver the concentrator today and will do the other test tomorrow night and  will not use the o2 for this study.  Additional Follow-up by: Alfonso Ramus,  March 11, 2010 3:28 PM

## 2010-03-20 NOTE — Miscellaneous (Signed)
  Clinical Lists Changes  Observations: Added new observation of BILI TOTAL: 0.3 mg/dL (16/11/9602 54:09) Added new observation of ALK PHOS: 52 units/L (12/10/2009 11:20) Added new observation of SGPT (ALT): 18 units/L (12/10/2009 11:20) Added new observation of SGOT (AST): 14 units/L (12/10/2009 11:20) Added new observation of PROTEIN, TOT: 6.8 g/dL (81/19/1478 29:56) Added new observation of ALBUMIN: 4.3 g/dL (21/30/8657 84:69) Added new observation of CALCIUM: 9.8 mg/dL (62/95/2841 32:44) Added new observation of GLUCOSE SER: 88 mg/dL (02/12/7251 66:44) Added new observation of CREATININE: 11 mg/dL (03/47/4259 56:38) Added new observation of BUN: 0.91 mg/dL (75/64/3329 51:88) Added new observation of CO2 TOTAL: 29 mmol/L (12/10/2009 11:20) Added new observation of CHLORIDE: 98 mmol/L (12/10/2009 11:20) Added new observation of POTASSIUM: 5.4 mmol/L (12/10/2009 11:20) Added new observation of SODIUM: 135 mmol/L (12/10/2009 11:20) Added new observation of PLATELETS: 329 10*3/mm3 (12/10/2009 11:20) Added new observation of MCH: 30.0 pg (12/10/2009 11:20) Added new observation of MCV: 88.4 fL (12/10/2009 11:20) Added new observation of HCT: 42.8 % (12/10/2009 11:20) Added new observation of HGB: 14.5 g/dL (41/66/0630 16:01) Added new observation of RBC: 4.84 10*6/mm3 (12/10/2009 11:20) Added new observation of WBC: 11.5 10*3/mm3 (12/10/2009 11:20) Added new observation of PSA: 0.00 ng/mL (10/18/2009 11:20) Added new observation of TRIGLYC TOT: 110 mg/dL (09/32/3557 32:20) Added new observation of LDL: 106 mg/dL (25/42/7062 37:62) Added new observation of HDL: 65 mg/dL (83/15/1761 60:73) Added new observation of CHOLESTEROL: 193 mg/dL (71/07/2692 85:46) Added new observation of BILI TOTAL: 0.3 mg/dL (27/04/5007 38:18) Added new observation of ALK PHOS: 61 units/L (10/18/2009 11:20) Added new observation of SGPT (ALT): 8 units/L (10/18/2009 11:20) Added new observation of SGOT (AST): 13  units/L (10/18/2009 11:20) Added new observation of PROTEIN, TOT: 7.1 g/dL (29/93/7169 67:89) Added new observation of ALBUMIN: 4.6 g/dL (38/11/1749 02:58) Added new observation of CALCIUM: 9.6 mg/dL (52/77/8242 35:36) Added new observation of GLUCOSE SER: 93 mg/dL (14/43/1540 08:67) Added new observation of CREATININE: 0.87 mg/dL (61/95/0932 67:12) Added new observation of BUN: 10 mg/dL (45/80/9983 38:25) Added new observation of CO2 TOTAL: 26 mmol/L (10/18/2009 11:20) Added new observation of CHLORIDE: 100 mmol/L (10/18/2009 11:20) Added new observation of POTASSIUM: 4.6 mmol/L (10/18/2009 11:20) Added new observation of SODIUM: 135 mmol/L (10/18/2009 11:20)

## 2010-03-20 NOTE — Miscellaneous (Signed)
  Clinical Lists Changes  Observations: Added new observation of ZOSTAVAX: historical (07/11/2008 10:39) Added new observation of TD BOOSTER: Historical (05/19/2007 10:36) Added new observation of PNEUMOVAX: Historical (12/19/2004 10:36)      Immunization History:  Tetanus/Td Immunization History:    Tetanus/Td:  historical (05/19/2007)  Pneumovax Immunization History:    Pneumovax:  historical (12/19/2004)  Zostavax History:    Zostavax # 1:  historical (07/11/2008)

## 2010-03-20 NOTE — Procedures (Signed)
Summary: Oximetry/[facility]   Oximetry/[facility]   Imported By: Lester [place] 03/14/2010 10:37:16  _____________________________________________________________________  External Attachment:    Type:   Image     Comment:   External Document

## 2010-03-21 ENCOUNTER — Telehealth (INDEPENDENT_AMBULATORY_CARE_PROVIDER_SITE_OTHER): Payer: Self-pay | Admitting: *Deleted

## 2010-03-28 NOTE — Progress Notes (Signed)
Summary: smoking cessation definitive answer to emphysema per [facility]  Phone Note Call from Patient   Caller: Patient Call For: clance Summary of Call: pt reqd "online" that there is an abx that a pt. could take "for about a yr" that helps w/ COPD. pt # [id number] (i believe he said he uses [facility]- pls. verify- thanks. Initial call taken by: Tivis Ringer, CNA,  March 21, 2010 11:45 AM  Follow-up for Phone Call        Spoke with pt.  He states that he read online article that stated that there was a study done that showed that pt's with COPD that took azithromycin x 1 yr showed a great deal of improvement in COPD.  Wants to know what [facility] thinks about this.  He also states that he has stopped smoking and doing well currently.  Pls advise thanks! Follow-up by: Vernie Murders,  March 21, 2010 11:58 AM  Additional Follow-up for Phone Call Additional follow up Details #1::        that is not true.  there are some studies with questionable benefit from using macrolide abx (zithro), but it is not accepted currently as standard of care.  let him know there are many studies that show beyond a shadow of a doubt that smoking cessation is the ONLY definitive treatment for emphysema that affects outcome. Additional Follow-up by: Barbaraann Share MD,  March 21, 2010 1:11 PM    Additional Follow-up for Phone Call Additional follow up Details #2::    Pt is aware of advice from Dr. Shelle Iron and will continue to stay away from the cigarettes.Malik Zuniga [facility]  March 21, 2010 2:21 PM Follow-up by: Malik Zuniga CMA,  March 21, 2010 2:22 PM

## 2010-03-28 NOTE — Miscellaneous (Signed)
Summary: Orders Update  Clinical Lists Changes  Orders: Added new Referral order of DME Referral (DME) - Signed 

## 2010-04-02 ENCOUNTER — Encounter: Payer: Self-pay | Admitting: Family Medicine

## 2010-04-04 ENCOUNTER — Encounter: Payer: Self-pay | Admitting: Family Medicine

## 2010-04-05 ENCOUNTER — Encounter: Payer: Self-pay | Admitting: Family Medicine

## 2010-04-09 NOTE — Procedures (Signed)
Summary: Oximetry/[facility]  Oximetry/[facility]   Imported By: Lester [place] 04/01/2010 09:40:11  _____________________________________________________________________  External Attachment:    Type:   Image     Comment:   External Document

## 2010-04-11 ENCOUNTER — Telehealth (INDEPENDENT_AMBULATORY_CARE_PROVIDER_SITE_OTHER): Payer: Self-pay | Admitting: *Deleted

## 2010-04-15 ENCOUNTER — Encounter: Payer: Self-pay | Admitting: Family Medicine

## 2010-04-15 ENCOUNTER — Encounter (INDEPENDENT_AMBULATORY_CARE_PROVIDER_SITE_OTHER): Payer: Medicare Other | Admitting: Family Medicine

## 2010-04-15 ENCOUNTER — Other Ambulatory Visit: Payer: Self-pay | Admitting: Family Medicine

## 2010-04-15 DIAGNOSIS — I10 Essential (primary) hypertension: Secondary | ICD-10-CM

## 2010-04-15 DIAGNOSIS — E785 Hyperlipidemia, unspecified: Secondary | ICD-10-CM

## 2010-04-15 DIAGNOSIS — Z0000 Encounter for general adult medical examination without abnormal findings: Secondary | ICD-10-CM

## 2010-04-15 DIAGNOSIS — Z8546 Personal history of malignant neoplasm of prostate: Secondary | ICD-10-CM

## 2010-04-15 DIAGNOSIS — Z136 Encounter for screening for cardiovascular disorders: Secondary | ICD-10-CM

## 2010-04-15 LAB — LDL CHOLESTEROL, DIRECT: Direct LDL: 144.9 mg/dL

## 2010-04-15 LAB — HEPATIC FUNCTION PANEL
ALT: 13 U/L (ref 0–53)
AST: 20 U/L (ref 0–37)
Albumin: 4.4 g/dL (ref 3.5–5.2)
Alkaline Phosphatase: 42 U/L (ref 39–117)
Total Protein: 7.5 g/dL (ref 6.0–8.3)

## 2010-04-15 LAB — BASIC METABOLIC PANEL
CO2: 25 mEq/L (ref 19–32)
Calcium: 9.7 mg/dL (ref 8.4–10.5)
Creatinine, Ser: 1 mg/dL (ref 0.4–1.5)
GFR: 83.6 mL/min (ref >60.00)
Sodium: 135 mEq/L (ref 135–145)

## 2010-04-15 LAB — PSA: PSA: 0.01 ng/mL — ABNORMAL LOW (ref 0.10–4.00)

## 2010-04-15 LAB — CBC WITH DIFFERENTIAL/PLATELET
Basophils Absolute: 0 10*3/uL (ref 0.0–0.1)
Basophils Relative: 0.3 % (ref 0.0–3.0)
Eosinophils Absolute: 0.1 10*3/uL (ref 0.0–0.7)
Lymphocytes Relative: 34 % (ref 12.0–46.0)
MCHC: 34.1 g/dL (ref 30.0–36.0)
Monocytes Relative: 4.5 % (ref 3.0–12.0)
Neutrophils Relative %: 60.7 % (ref 43.0–77.0)
RBC: 4.75 Mil/uL (ref 4.22–5.81)
RDW: 15.5 % — ABNORMAL HIGH (ref 11.5–14.6)

## 2010-04-15 LAB — LIPID PANEL
Cholesterol: 244 mg/dL — ABNORMAL HIGH (ref 0–200)
Total CHOL/HDL Ratio: 3
Triglycerides: 123 mg/dL (ref 0.0–149.0)

## 2010-04-17 ENCOUNTER — Telehealth: Payer: Self-pay | Admitting: Family Medicine

## 2010-04-18 NOTE — Progress Notes (Signed)
Summary: wants samples of sprivia  Phone Note Call from Patient Call back at Home Phone [phone]   Caller: SPOUSE/KATHLEEN Call For: [facility] Summary of Call: Patients wife Malik Zuniga phoned and stated that theu wanted to know if we had any samples of Spiriva. She can be reached at [phone] Initial call taken by: Vedia Coffer,  April 11, 2010 4:08 PM  Follow-up for Phone Call        Spoke with spouse and advised that we do not have samples at this time and can check next time he is here for appt.   Follow-up by: Vernie Murders,  April 11, 2010 4:26 PM

## 2010-04-23 NOTE — Assessment & Plan Note (Signed)
Summary: CPX/PT WILL BE FASTING/[facility]   Vital Signs:  Patient profile:   65 year old male Height:      69 inches (175.26 cm) Weight:      189 pounds (85.91 kg) BMI:     28.01 Temp:     97.9 degrees F (36.61 degrees C) oral BP sitting:   120 / 80  (left arm) Cuff size:   regular  Vitals Entered By: Lucious Groves CMA (April 15, 2010 8:09 AM) CC: Fasting CPX./kb Is Patient Diabetic? No Pain Assessment Patient in pain? no        History of Present Illness: 65 yo man here today for CPE.  UTD on colonoscopy, Uro- sees [name] yearly  Here for Medicare AWV:  1.   Risk factors based on Past M, S, F history: HTN- BP well controlled.  no CP.  + SOB.  no edema, HAs, visual changes. Hyperlipidemia- on Simvastatin.  no abd pain, N/V. COPD- still smoking 1/2 ppd.  'having a lot of trouble w/ this bronchitis'.  follows w/ Dr Shelle Iron.  often gets 'hot'- 'to the point i need to turn a fan on me'.  hasn't been using Spiriva b/c he ran out of meds.  'i do pretty good when i take my inhalers'  2.   Physical Activities: minimal activity due to chronic back pain 3.   Depression/mood: doing fairly well, sees psych regularly for med management 4.   Hearing: wears hearind aids 5.   ADL's: independent 6.   Fall Risk: somewhat increased risk due to use of single point cane, hunched posture 7.   Home Safety: feels safe 8.   Height, weight, &visual acuity: see vitals, vision corrected to 20/20 w/ glasses 9.   Counseling: provided on smoking cessation,  physical activity 10.   Labs ordered based on risk factors: see A&P 11.           Referral Coordination: see A&P 12.           Care Plan: see A&P 13.           Cognitive Assessment: normal linear thought process, memory intact for recent and remote  Preventive Screening-Counseling & Management  Alcohol-Tobacco     Smoking Status: current     Smoking Cessation Counseling: yes     Smoke Cessation Stage: contemplative     Packs/Day:  0.5  Caffeine-Diet-Exercise     Does Patient Exercise: no      Sexual History:  currently monogamous.    Current Medications (verified): 1)  Ipratropium-Albuterol 0.5-2.5 (3) Mg/74ml Soln (Ipratropium-Albuterol) .... Use Once Via Nebullizer 4 Times A Day 2)  Ventolin Hfa 108 (90 Base) Mcg/act Aers (Albuterol Sulfate) .... Inhale 2 Puffs Qid As Needed 3)  Vesicare 5 Mg Tabs (Solifenacin Succinate) .... Take One Tablet Daily 4)  Hydrocodone-Acetaminophen 5-500 Mg Tabs (Hydrocodone-Acetaminophen) .... Take One Tablet 4 Times A Daily As Needed 5)  Symbicort 160-4.5 Mcg/act Aero (Budesonide-Formoterol Fumarate) .... Use Daily 6)  Spiriva Handihaler 18 Mcg Caps (Tiotropium Bromide Monohydrate) .... Inhale The Contents of 1 Capsule Daily 7)  Fluticasone Propionate 50 Mcg/act Susp (Fluticasone Propionate) .... Use 2 Sprays Into Nostril Daily 8)  Lactulose 10 Gm/85ml Soln (Lactulose) .... Take One Teaspoonful Three Times A Day 9)  Vitamin D3 1000 Unit Caps (Cholecalciferol) .... Take One Tablet Daily 10)  Omeprazole 40 Mg Cpdr (Omeprazole) .... Take One Tablet Two Times A Day 11)  Hydrocodone-Acetaminophen 10-325 Mg/85ml Soln (Hydrocodone-Acetaminophen) .... Use 1 Teaspoonful  Every 12 Hours As Needed 12)  Metoprolol Succinate 100 Mg Xr24h-Tab (Metoprolol Succinate) .... Take One Tablet Daily 13)  Diazepam 5 Mg Tabs (Diazepam) .... Take 1-2 Tablets By Mouth Every 6 Hours As Needed 14)  Divalproex Sodium 500 Mg Xr24h-Tab (Divalproex Sodium) .... Take 2 Tablets At Bedtime 15)  Vitamin C 500 Mg Tabs (Ascorbic Acid) .... Take One Tablet Daily 16)  Primidone 250 Mg Tabs (Primidone) .... Take One Tablet Two Times A Day 17)  Fish Oil 1000 Mg Caps (Omega-3 Fatty Acids) .... Take One Tablet Daily 18)  Zinc 50 Mg Tabs (Zinc) .... Take One Tablet Daily 19)  Abilify 10 Mg Tabs (Aripiprazole) .... Take 1/2-1 Tablet Daily 20)  Simvastatin 20 Mg Tabs (Simvastatin) .... Take One Tablet At Bedtime 21)  Trazodone Hcl  100 Mg Tabs (Trazodone Hcl) .... Take One Tablet At Bedtime 22)  Oxycodone-Acetaminophen 5-325 Mg Tabs (Oxycodone-Acetaminophen) .... Take One Tablet Every 6 Hours As Needed 23)  Px Enteric Aspirin 81 Mg Tbec (Aspirin) .... Tablet Daily 24)  Paroxetine Hcl 20 Mg Tabs (Paroxetine Hcl) .... Take 3 Tablets Daily 25)  Miconazole Nitrate 2 % Crea (Miconazole Nitrate) .... Apply To Affected Area Two Times A Day As Needed For Rash 26)  Nystatin 100000 Unit/gm Powd (Nystatin) .... Apply To Affected Area Two Times A Day.  Disp 1 Container 27)  Prednisone 10 Mg Tabs (Prednisone) .... Once Daily 28)  Doxycycline Hyclate 100 Mg Tabs (Doxycycline Hyclate) .Marland Kitchen.. 1 Two Times A Day 29)  Ondansetron Hcl 8 Mg Tabs (Ondansetron Hcl) .... As Needed Only 30)  Nitrostat 0.4 Mg Subl (Nitroglycerin) .... As Needed 31)  Echinacea 400 Mg Caps (Echinacea) .... Three Times A Day 32)  X-Lax .... Two Times A Day 33)  Nyquil .... Once Daily 34)  Alka Seltzer Plus .... Two Times A Day  Allergies (verified): No Known Drug Allergies  Past History:  Past medical, surgical, family and social histories (including risk factors) reviewed, and no changes noted (except as noted below).  Past Medical History: Reviewed history from 02/20/2010 and no changes required.   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 back surgery x 5 Cholecystectomy  moved left arm ulna nerve over craniotomy to clip aneurism vascular stent prostate cancer removed  Family History: Reviewed history from 02/20/2010 and no changes required. emphysema: mother heart disease: mother brain  cancer: father prostate cancer: father  Social History: Reviewed history from 02/20/2010 and no changes required. married Patient is a current smoker. 1 ppd. 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  The patient denies anorexia, fever, weight loss, weight gain, vision loss, decreased hearing, hoarseness, chest pain, syncope, peripheral edema, prolonged cough, headaches, abdominal pain, melena, hematochezia, severe indigestion/heartburn, hematuria, suspicious skin lesions, depression, abnormal bleeding, enlarged lymph nodes, and testicular masses.    Physical Exam  General:  Well-developed,well-nourished,in no acute distress; alert,appropriate and cooperative throughout examination Head:  NCAT Eyes:  No corneal or conjunctival inflammation noted. EOMI. Perrla. Funduscopic exam benign, without hemorrhages, exudates or papilledema. Vision grossly normal. Ears:  External ear exam shows no significant lesions  or deformities.  Otoscopic examination reveals clear canals, tympanic membranes are intact bilaterally without bulging, retraction, inflammation or discharge. Hearing is grossly normal bilaterally. Nose:  External nasal examination shows no deformity or inflammation. Nasal mucosa are pink and moist without lesions or exudates. Mouth:  Oral mucosa and oropharynx without lesions or exudates.  Teeth in good repair. Neck:  No deformities, masses, or tenderness noted. Chest Wall:  pectus excavatum.   Lungs:  coarse BS diffusely, decreased BS at the bases Heart:  reg S1/S2 Abdomen:  soft, NT/ND, +BS Rectal:  No external abnormalities noted. Normal sphincter tone. No rectal masses or tenderness. Genitalia:  Testes bilaterally descended without nodularity, tenderness or masses. No scrotal masses or lesions. No penis lesions or urethral discharge. Prostate:  s/p TURP Msk:  stooped posture,  kyphosis s/p multiple spinal fusions Pulses:  +2 carotid, radial, DP Extremities:  no C/C/E Neurologic:  alert & oriented X3, cranial nerves II-XII intact, and DTRs symmetrical and normal.   Skin:  multiple keratosis on back Cervical Nodes:  No lymphadenopathy noted Inguinal Nodes:  No significant adenopathy Psych:  Oriented X3, memory intact for recent and remote, normally interactive, good eye contact, not anxious appearing, and not depressed appearing.     Impression & Recommendations:  Problem # 1:  PHYSICAL EXAMINATION (ICD-V70.0) Assessment New  pt's PE w/ multiple abnormalities- but none new for pt.  check labs.  UTD on health maintainence.  Orders: Medicare -1st Annual Wellness Visit [phone]) DRE (G9562) Prostate / PSA (Medicare) (G0103)  Problem # 2:  HYPERTENSION (ICD-401.9) Assessment: Unchanged well controlled.  asymptomatic.  continue current meds. His updated medication list for this problem includes:    Metoprolol Succinate 100 Mg Xr24h-tab (Metoprolol succinate) .Marland Kitchen... Take one tablet daily  Orders: TLB-BMP (Basic Metabolic Panel-BMET) (80048-METABOL) TLB-CBC Platelet - w/Differential (85025-CBCD) TLB-TSH (Thyroid Stimulating Hormone) (84443-TSH) Specimen Handling (13086)  Problem # 3:  HYPERLIPIDEMIA (ICD-272.4) Assessment: Unchanged  His updated medication list for this problem includes:    Simvastatin 20 Mg Tabs (Simvastatin) .Marland Kitchen... Take one tablet at bedtime  Orders: Venipuncture (57846) TLB-Lipid Panel (80061-LIPID) TLB-Hepatic/Liver Function Pnl (80076-HEPATIC) Specimen Handling (96295) Prescription Created Electronically [id number])  Problem # 4:  EMPHYSEMA (ICD-492.8) Assessment: Unchanged fairly well controlled but pt has not been taking spiriva regularly due to lack of meds and inability to afford.  sample given.  strongly encouraged him to use meds regularly.  will follow.  Complete Medication List: 1)  Ipratropium-albuterol 0.5-2.5 (3) Mg/47ml  Soln (Ipratropium-albuterol) .... Use once via nebullizer 4 times a day 2)  Ventolin Hfa 108 (90 Base) Mcg/act Aers (Albuterol sulfate) .... Inhale 2 puffs qid as needed 3)  Vesicare 5 Mg Tabs (Solifenacin succinate) .... Take one tablet daily 4)  Hydrocodone-acetaminophen 5-500 Mg Tabs (Hydrocodone-acetaminophen) .... Take one tablet 4 times a daily as needed 5)  Symbicort 160-4.5 Mcg/act Aero (Budesonide-formoterol fumarate) .... Use daily 6)  Spiriva Handihaler 18 Mcg Caps (Tiotropium bromide monohydrate) .... Inhale the contents of 1 capsule daily 7)  Fluticasone Propionate 50 Mcg/act Susp (Fluticasone propionate) .... Use 2 sprays into nostril daily 8)  Lactulose 10 Gm/62ml Soln (Lactulose) .... Take one teaspoonful three times a day 9)  Vitamin D3 1000 Unit Caps (Cholecalciferol) .... Take one tablet daily 10)  Omeprazole 40 Mg Cpdr (Omeprazole) .... Take one tablet two times a day 11)  Hydrocodone-acetaminophen 10-325 Mg/32ml Soln (Hydrocodone-acetaminophen) .... Use 1 teaspoonful every 12 hours as needed 12)  Metoprolol Succinate 100 Mg Xr24h-tab (Metoprolol succinate) .... Take one tablet  daily 13)  Diazepam 5 Mg Tabs (Diazepam) .... Take 1-2 tablets by mouth every 6 hours as needed 14)  Divalproex Sodium 500 Mg Xr24h-tab (Divalproex sodium) .... Take 2 tablets at bedtime 15)  Vitamin C 500 Mg Tabs (Ascorbic acid) .... Take one tablet daily 16)  Primidone 250 Mg Tabs (Primidone) .... Take one tablet two times a day 17)  Fish Oil 1000 Mg Caps (Omega-3 fatty acids) .... Take one tablet daily 18)  Zinc 50 Mg Tabs (Zinc) .... Take one tablet daily 19)  Abilify 10 Mg Tabs (Aripiprazole) .... Take 1/2-1 tablet daily 20)  Simvastatin 20 Mg Tabs (Simvastatin) .... Take one tablet at bedtime 21)  Trazodone Hcl 100 Mg Tabs (Trazodone hcl) .... Take one tablet at bedtime 22)  Oxycodone-acetaminophen 5-325 Mg Tabs (Oxycodone-acetaminophen) .... Take one tablet every 6 hours as needed 23)  Px  Enteric Aspirin 81 Mg Tbec (Aspirin) .... Tablet daily 24)  Paroxetine Hcl 20 Mg Tabs (Paroxetine hcl) .... Take 3 tablets daily 25)  Miconazole Nitrate 2 % Crea (Miconazole nitrate) .... Apply to affected area two times a day as needed for rash 26)  Nystatin 100000 Unit/gm Powd (Nystatin) .... Apply to affected area two times a day.  disp 1 container 27)  Prednisone 10 Mg Tabs (Prednisone) .... Once daily 28)  Doxycycline Hyclate 100 Mg Tabs (Doxycycline hyclate) .Marland Kitchen.. 1 two times a day 29)  Ondansetron Hcl 8 Mg Tabs (Ondansetron hcl) .... As needed only 30)  Nitrostat 0.4 Mg Subl (Nitroglycerin) .... As needed 31)  Echinacea 400 Mg Caps (Echinacea) .... Three times a day 32)  X-lax  .... Two times a day 33)  Nyquil  .... Once daily 34)  Alka Seltzer Plus  .... Two times a day  Other Orders: TLB-PSA (Prostate Specific Antigen) (84153-PSA)  Patient Instructions: 1)  Please schedule a follow-up appointment in 6 months to recheck cholesterol and BP. 2)  We'll notify you of your lab results 3)  STOP SMOKING!  You can do it! 4)  Use your inhalers regularly 5)  Call with any questions or concerns 6)  Hang in there!  Prescriptions: SIMVASTATIN 20 MG TABS (SIMVASTATIN) take one tablet at bedtime  #90 x 3   Entered and Authorized by:   Neena Rhymes MD   Signed by:   Neena Rhymes MD on 04/15/2010   Method used:   Electronically to        [facility]* (retail)       [address] [place], [place]  [address]       Ph: [phone] or [phone]       Fax: [phone]   RxID:   [id number] METOPROLOL SUCCINATE 100 MG XR24H-TAB (METOPROLOL SUCCINATE) take one tablet daily  #90 x 3   Entered and Authorized by:   Neena Rhymes MD   Signed by:   Neena Rhymes MD on 04/15/2010   Method used:   Electronically to        [facility]* (retail)       [address] [place], [place]  [address]       Ph: [phone] or [phone]       Fax: [phone]   RxID:   [id number] LACTULOSE 10 GM/15ML SOLN (LACTULOSE) take one  teaspoonful three times a day  #3 months x 3   Entered and Authorized by:   Neena Rhymes MD   Signed by:   Neena Rhymes MD on 04/15/2010   Method used:   Electronically to        [facility]* (retail)       [address] [place], [place]  [address]       Ph: [phone] or [phone]       Fax: [phone]   RxID:   [id number] SPIRIVA HANDIHALER 18 MCG CAPS (TIOTROPIUM BROMIDE MONOHYDRATE) inhale the contents of 1 capsule daily  #90 x 3   Entered and Authorized by:   Neena Rhymes MD   Signed by:   Neena Rhymes MD on 04/15/2010   Method used:   Electronically to        [facility]* (retail)       [address] [place], [place]  [address]       Ph: [phone] or [phone]       Fax: [phone]   RxID:   [id number]    Orders Added: 1)  Venipuncture [36415] 2)  TLB-Lipid Panel [80061-LIPID] 3)  TLB-Hepatic/Liver Function Pnl [80076-HEPATIC] 4)  TLB-BMP (Basic Metabolic Panel-BMET) [80048-METABOL] 5)  TLB-CBC Platelet - w/Differential [85025-CBCD] 6)  TLB-TSH (Thyroid Stimulating Hormone) [84443-TSH] 7)  TLB-PSA (Prostate Specific Antigen) [84153-PSA] 8)  Specimen Handling [99000] 9)  Medicare -1st Annual Wellness Visit [G0438] 10)  Est. Patient Level IV [57322] 11)  DRE [G0102] 12)  Prostate / PSA (Medicare) [G0103] 13)  Prescription Created Electronically [phone]  Appended Document: CPX/PT WILL BE FASTING/[facility]    Clinical Lists Changes  Orders: Added new Service order of EKG w/ Interpretation (93000) - Signed

## 2010-04-23 NOTE — Progress Notes (Signed)
Summary: has a cold and constant hot flashes  Phone Note Call from Patient Call back at Home Phone [phone]   Caller: Patient Summary of Call: patient called to report that he is having constant hot flashes,  has a cold--wanted to speak to Dr Beverely Low, I asked if a triage nurse could call him and he said OK---please call him at [phone] Initial call taken by: Jerolyn Shin,  April 17, 2010 1:02 PM  Follow-up for Phone Call        Pt c/o hot flashes and excessive sweating throughtout the day. Pt states that he discuss this with you at OV. Pt would like for you to review med list to see if any med could be causing this and what can be done to stop symptoms or next step. Pls advise ...Marland KitchenMarland KitchenFelecia Deloach CMA  April 17, 2010 2:27 PM   Additional Follow-up for Phone Call Additional follow up Details #1::        i don't see any meds that would obviously cause him to be sweating- i'm not as familiar with the side effects of his psych meds (he should call his psychiatrist).  but people w/ COPD often feel hot and have trouble breathing when they feel warm and feel better w/ fans on them.  if he is having trouble w/ his COPD he should call pulmonary and let them know Additional Follow-up by: Neena Rhymes MD,  April 17, 2010 3:34 PM    Additional Follow-up for Phone Call Additional follow up Details #2::    Discuss with patient ............Marland KitchenFelecia Deloach CMA  April 17, 2010 3:59 PM

## 2010-04-29 LAB — CBC
HCT: 45.3 % (ref 39.0–52.0)
Platelets: 285 10*3/uL (ref 150–400)
RDW: 13.5 % (ref 11.5–15.5)
WBC: 7.9 10*3/uL (ref 4.0–10.5)

## 2010-04-29 LAB — BASIC METABOLIC PANEL
BUN: 11 mg/dL (ref 6–23)
BUN: 8 mg/dL (ref 6–23)
BUN: 9 mg/dL (ref 6–23)
CO2: 27 mEq/L (ref 19–32)
Calcium: 7.8 mg/dL — ABNORMAL LOW (ref 8.4–10.5)
Calcium: 9.4 mg/dL (ref 8.4–10.5)
Chloride: 99 mEq/L (ref 96–112)
Creatinine, Ser: 0.73 mg/dL (ref 0.4–1.5)
Creatinine, Ser: 0.77 mg/dL (ref 0.4–1.5)
Creatinine, Ser: 0.87 mg/dL (ref 0.4–1.5)
GFR calc Af Amer: >60 mL/min (ref >60)
GFR calc non Af Amer: >60 mL/min (ref >60)
GFR calc non Af Amer: >60 mL/min (ref >60)
Glucose, Bld: 82 mg/dL (ref 70–99)
Glucose, Bld: 98 mg/dL (ref 70–99)
Potassium: 4.9 mEq/L (ref 3.5–5.1)
Potassium: 5.2 mEq/L — ABNORMAL HIGH (ref 3.5–5.1)

## 2010-04-29 LAB — SURGICAL PCR SCREEN
MRSA, PCR: NEGATIVE
Staphylococcus aureus: NEGATIVE

## 2010-04-29 LAB — TYPE AND SCREEN
ABO/RH(D): A POS
Antibody Screen: NEGATIVE

## 2010-04-29 LAB — DIFFERENTIAL
Basophils Absolute: 0 10*3/uL (ref 0.0–0.1)
Lymphocytes Relative: 41 % (ref 12–46)
Lymphs Abs: 3.2 10*3/uL (ref 0.7–4.0)
Neutro Abs: 4 10*3/uL (ref 1.7–7.7)
Neutrophils Relative %: 51 % (ref 43–77)

## 2010-05-03 ENCOUNTER — Ambulatory Visit (INDEPENDENT_AMBULATORY_CARE_PROVIDER_SITE_OTHER): Payer: Medicare Other | Admitting: Family Medicine

## 2010-05-03 ENCOUNTER — Encounter: Payer: Self-pay | Admitting: Family Medicine

## 2010-05-03 DIAGNOSIS — D485 Neoplasm of uncertain behavior of skin: Secondary | ICD-10-CM

## 2010-05-03 DIAGNOSIS — R259 Unspecified abnormal involuntary movements: Secondary | ICD-10-CM

## 2010-05-03 NOTE — Patient Instructions (Signed)
We will work on your DMV form and fill it out the best we can! Someone will call you with your Derm appt Your tremor is likely a result of the head injury but may be worsened by some of your meds- we'll wait until the meds have all been adjusted and then consider following up w/ neuro to re-assess Call with any questions or concerns Hang in there!

## 2010-05-03 NOTE — Progress Notes (Signed)
  Subjective:    Patient ID: Malik Zuniga, male    DOB: 1945/05/24, 65 y.o.   MRN: [id number]  HPI Area on arm- first noticed ~1 month ago.  Not painful but not healing.  Not enlarging or changing color.  Some uneven texture.  Friend told pt to be concerned for skin cancer.  Tremor- pt reports he has had chronic tremor since closed head injury in 1998.  Pt and wife not the tremor seems to be worsening.  Primidone w/out relief.  Has not seen neurology 'in a long time'.  Has been 'cleared' of Parkinson's.  Tremor is most noticeable toward the end of an intended action.  Less prominent at rest.  Wants to know what meds could be causing/worsening his sxs.   Review of Systems For ROS see HPI      Objective:   Physical Exam  Constitutional: He is oriented to person, place, and time. He appears well-developed and well-nourished.  Neurological: He is alert and oriented to person, place, and time.       Pt w/ low amplitude resting tremor that becomes high amplitude w/ intenetional movement.  Tremor only present in upper extremities, not lower extremities or head/neck/face  Skin:       ~1 cm diameter ecchymotic appearing area on L forearm w/ irregular texture.  Not tender.  Well circumscribed.  Non blanching.          Assessment & Plan:

## 2010-05-07 ENCOUNTER — Encounter: Payer: Self-pay | Admitting: Family Medicine

## 2010-05-07 DIAGNOSIS — D485 Neoplasm of uncertain behavior of skin: Secondary | ICD-10-CM | POA: Insufficient documentation

## 2010-05-07 NOTE — Assessment & Plan Note (Signed)
Reviewed pt's med list extensively w/ pt and wife- discussed that SSRI, depakote can both worsen intention tremors.  Causes of tremors can include head trauma and stroke- pt has had both.  Pt reports that psych is adjusting meds, recommended that pt f/u w/ neuro to re-evaluate tremor once meds are stable.  Pt and wife in agreement.

## 2010-05-07 NOTE — Assessment & Plan Note (Signed)
Given pt's concern and fact that he has never seen derm will refer for complete skin evaluation.

## 2010-05-23 LAB — CBC
MCHC: 34.7 g/dL (ref 30.0–36.0)
RDW: 14.4 % (ref 11.5–15.5)

## 2010-05-23 LAB — BASIC METABOLIC PANEL
CO2: 25 mEq/L (ref 19–32)
Calcium: 8.7 mg/dL (ref 8.4–10.5)
Creatinine, Ser: 0.6 mg/dL (ref 0.4–1.5)
GFR calc Af Amer: >60 mL/min (ref >60)
GFR calc non Af Amer: >60 mL/min (ref >60)
Glucose, Bld: 105 mg/dL — ABNORMAL HIGH (ref 70–99)
Sodium: 133 mEq/L — ABNORMAL LOW (ref 135–145)

## 2010-06-18 ENCOUNTER — Telehealth: Payer: Self-pay | Admitting: Family Medicine

## 2010-06-18 DIAGNOSIS — M549 Dorsalgia, unspecified: Secondary | ICD-10-CM

## 2010-06-18 MED ORDER — HYDROCODONE-ACETAMINOPHEN 5-500 MG PO TABS: 1.0000 | ORAL_TABLET | Freq: Four times a day (QID) | ORAL | Status: DC | PRN

## 2010-06-18 NOTE — Telephone Encounter (Signed)
Ok to give 1 month supply.  Given that pt will be on chronic pain meds this doesn't seem like the best regimen (4x/day dosing).  Should see pain management to see if meds can be changed for easier long term use.  Please enter referral.

## 2010-06-18 NOTE — Telephone Encounter (Signed)
Please ask who was prescribing.  i cannot remember if he sees pain management or not.

## 2010-06-18 NOTE — Telephone Encounter (Signed)
Pt had cpx on 04/15/10 and this appears to have not been given by our office. Please advise.

## 2010-06-18 NOTE — Telephone Encounter (Signed)
Patient needs new rx for hydrocodone apap 5/500mg  - 1 tablet 4 times a day -  patient changed mail order pharmacy - patient wife will pick rx up Friday [phone]

## 2010-06-18 NOTE — Telephone Encounter (Signed)
Per spouse it was previously given by old PCP Dr. [name]. She notes that he gets Percocet from Dr. Dutch Quint and is almost off of that med now. She notes he was previously seen at pain management (Dr. Murray Hodgkins). She denies patient having signed contract with pain management because he only saw the doctor twice. She states that he was given injections that did not help him at all. She is aware that was over a year ago. I made spouse aware that Dr. Beverely Low may recommend referral to pain management. Please advise.

## 2010-06-19 NOTE — Telephone Encounter (Signed)
Pt spouse notified. 

## 2010-06-25 NOTE — Discharge Summary (Signed)
NAME:  KITT, LEDET NO.:  [id number]   MEDICAL RECORD NO.:  [id number]          PATIENT TYPE:  INP   LOCATION:  4704                         FACILITY:  [facility]   PHYSICIAN:  Altha Harm, MDDATE OF BIRTH:  Jun 05, 1945   DATE OF ADMISSION:  04/13/2007  DATE OF DISCHARGE:  04/15/2007                               DISCHARGE SUMMARY   DISCHARGE DISPOSITION:  Home.   FINAL DISCHARGE DIAGNOSES:  1. Febrile illness.  2. Atelectasis.  3. Hyperlipidemia.  4. History of prostate cancer.  5. Depression.  6. History of hyperlipidemia.  7. History of hypertension.  8. History of bronchial asthma.  9. Tobacco use disorder.  10.Chronic headaches.  11.History of coronary artery disease.  12.Spinal stenosis.  13.History of frequent falls.  14.Gait disorder.  15.History of depression.   DISCHARGE MEDICATIONS:  1. Simvastatin 10 mg p.o. daily.  2. Reglan 10 mg p.o. daily.  3. Hyoscyamine 0.125 mg p.o. b.i.d.  4. Primidone 250 mg p.o. daily.  5. Toprol XL 25 mg p.o. daily.  6. Darvocet-N 100 1 tab p.o. t.i.d. p.r.n.  7. Vicodin 5/500 2 tabs p.o. q.6 h. p.r.n.  8. Paxil 40 mg p.o. daily.  9. Lamictal 100 mg p.o. daily.  10.Klonopin 2 mg p.o. b.i.d.  11.Trazodone 100 mg p.o. daily.  12.Depakote ER 1,000 mg p.o. daily.  13.Abilify 5 mg p.o. daily.  14.Omeprazole 40 mg p.o. daily.  15.Aspirin 81 mg p.o. daily.  16.Vitamin C 500 mg p.o. daily.  17.Zinc 50 mg p.o. daily.  18.Elcarnitine 500 mg p.o. b.i.d.  19.Vitamin C 2,000 units p.o. daily.   CONSULTATIONS:  None.   PROCEDURE:  None.   DIAGNOSTICS:  1. Portable chest x-ray done on admission which shows bibasilar      atelectasis.  2. Follow up chest x-ray done on April 15, 2007 two-view which shows      plate-like atelectasis with no acute disease process.   CODE STATUS:  Full code.   ALLERGIES:  AMBIEN.   CHIEF COMPLAINT:  Shortness of breath, cough, fever for 2 days and  lethargy for 1.   HISTORY OF PRESENT ILLNESS:  Please see the H&P dictated by Dr. Christella Noa  for details of the HPI.   HOSPITAL COURSE:  Febrile illness:  The patient was admitted with a  clinical diagnosis of pneumonia and started on IV antibiotics.  The  patient's initial presentation was clinically typical of a pneumonia.  However, the chest x-ray did not bare out a diagnosis of pneumonia in  this patient.  The patient's febrile illness resolved.  After hydration  and antibiotics, the patient actually had marked improvement in his  clinical status back to his baseline.  The patient was placed on the  oral antibiotics and his IV fluid discontinued.  A chest x-ray repeated  and this was also found to be without any evidence of a pneumonia.  The  patient is being discharged on the above stated medications.  In light  of the fact that the patient has no fever and has no evidence of a  pneumonia, the patient will be  sent home without any antibiotics.  He is  to follow up with his primary care physician, Dr. [name], within 3-5  days.  The patient has been offered a swallow study to evaluate for  aspiration as the family states that he has been aspirating, however,  the family and the patient have both declined a swallow study.  The  patient wishes to continue with his food textures of choice regardless  of risk of aspiration.  All other medical problems remained stable.  The  patient is to continue on his usual medications.   CONDITION ON DISCHARGE:  Stable.  He is afebrile.  His vital signs are  as follows:  Temperature 97.3, heart rate 78, blood pressure 104/66,  respiratory rate 18.  The patient is being discharged in stable  condition.   LABORATORY DATA:  Please note that a CBC was ordered at the day of  discharge which the patient refused.  The patient's lab studies on the  day prior to discharge were as follows:  White blood cell count 7.0,  hemoglobin 12.9, hematocrit 37.8, platelet 189, sodium 137,  potassium  3.8, chloride 101, bicarb 28, BUN 9, creatinine 0.75.   DISCHARGE INSTRUCTIONS:  1. He has been advised to be on a heart-healthy diet.  However, we      cannot recommend texture on this patient as      he has refused a swallow study.  2. There are no physical restrictions.  3. The patient to follow up with Dr. [name], his primary care      physician within 3-5 days or as needed.      Altha Harm, MD  Electronically Signed     MAM/MEDQ  D:  04/15/2007  T:  04/15/2007  Job:  [id number]   cc:   Reuben Likes, M.D.

## 2010-06-25 NOTE — Op Note (Signed)
NAME:  Malik Zuniga, COLAO NO.:  [id number]   MEDICAL RECORD NO.:  [id number]          PATIENT TYPE:  INP   LOCATION:  3001                         FACILITY:  [facility]   PHYSICIAN:  Kathaleen Maser. Pool, M.D.    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   PROCEDURE NAME:  Re-exploration of L4-S1, posterior lumbar fusion.  Removal of L4-S1 hardware.  L3-L4 decompressive laminectomy with  foraminotomies, more than would be required for simple interbody fusion  alone.  L3-L4 posterior lumbar fusion utilizing tangent interbody  allograft wedge, Telamon interbody PEEK cage and local autografting.  L3-  L4 posterolateral arthrodesis utilizing segmental pedicle screw fixation  from L3-S1.   SURGEON:  Kathaleen Maser. Pool, MD   ASSISTANTYetta Barre.   ANESTHESIA:  General endotracheal.   INDICATIONS:  Mr. Mathey is a 65 year old male with a history of  previous L4-S1 fusion.  The patient presents now with worsening back  pain and lower extremity symptoms.  Workup demonstrates evidence of  segmental instability at the L3-L4 level with marked stenosis.  The  patient presents now for re-exploration of his fusion and fusion of the  L3-L4 segment.   OPERATIVE NOTE:  The patient was placed on the operating table in supine  position.  After adequate anesthesia achieved, the patient was placed in  prone onto Wilson frame.  Appropriately padded the patient's lumbar  region, prepped and draped sterilely.  A 10 blade was used to make a  curvilinear skin incision, overlying the L2-S1 levels.  This was carried  down sharply in the midline.  Subperiosteal dissection was then  performed exposing the lamina and facet joints of the L3 and  L4 as well  as the previously placed pedicle screw fixation from L4 down to S1.  The  pedicle screw fixation was then disassembled.  The pedicle screws were  probed and there was no evidence of instability from L4 to S1.  Because  of the previous fusion with somewhat immature, it was decided to leave  this instrumentation intact.  A complete decompressive laminectomy was  then performed at L3, removing the entire lamina of l3 and inferior  facet of L3 bilaterally.  Superior facetectomies of L4 was performed  bilaterally.  All bone was cleaned and used in later autograft.  Ligament flavum and epidural scar were then resected in piecemeal  fashion using Kerrison rongeurs.  Underlying thecal sac and exiting L3-  L4 nerve roots were identified.  Wide decompressive foraminotomies were  then performed along the course exiting nerve roots.  Epidural venous  plexus was coagulated and cut.  Starting first the patient's right side  at L3-L4, the disk space was then incised with #15 blade in a  rectangular fracture.  Wide disk space clean-out was achieved using  pituitary rongeurs, upbiting pituitary rongeurs, and Epstein curettes.  Procedure  was repeated on the contralateral side.  Disk space was then  sequentially dilated up to 10 mm with a 10-mm distractor left on the  patient's right side.  Thecal sac nerve was checked on the left side.  Disk space was then reamed and cut with 10 mm tangent instruments.  Soft  tissues were then removed via interspace.  Disk space was then further  curettaged.  A 10-mm x 26-mm Telamon cage packed with morselized  autograft, then packed into place, recessed approximately 2 mm from the  posterior cortical margin of L3.  Distractor was removed from the  patient's right side.  Thecal sac and nerve roots were protected on the  right side.  Disk space was once again reamed and then cut with a 10-mm  tangent instrument.  Soft tissue was then removed from the  interspace.  Morselized autograft and Progenix putty was then packed in the  interspace.  A 10-mm x 26-mm tangent wedge was then packed into place  and recessed approximately 2 mm from the posterior cortical margin.  Pedicles of L3 were then identified using surface landmarks with  intraoperative fluoroscopy.  Superficial bone overlying the pedicle was  then removed using high-speed drill to each pedicle and then probed  using pedicle awl.   Pedicle awl track was then probed and found be solid within bone.  Each  pedicle awl track was then tapped with a 5.2-mm screw tapper.  Each  screw tap hole was then probed and found be solid bone.  A 6.75-mm x 40-  mm radius screw was placed bilaterally at L3.  Transverse processes of  L3 and the previous posterolateral fusion mass at L4 were then  decorticated using high-speed drill.  Morselized autograft was packed  posterolaterally for later fusion.  Segment titanium rod was then  contoured and placed over screw heads from L3 down to S1.  Locking caps  were then engaged.  Locking caps were then tightened with construct  under compression.  Transverse connector was placed.  Final images  revealed good position of the bone grafts and hardware with normal  alignment of the spine.  Would was irrigated with antibiotic solution.  Gelfoam was placed topically for hemostasis and found be good.  A medium  Hemovac drains left at upper spacer.  The would was then closed in  layers with Vicryl suture.  Steri-Strips and sterile dressing were  applied.  There were no complications.  The patient tolerated the  procedure well and he returned to the recovery room for postoperative  care.           ______________________________  Kathaleen Maser. Pool, M.D.     HAP/MEDQ  D:  06/25/2007  T:  06/26/2007  Job:  [id number]

## 2010-06-25 NOTE — H&P (Signed)
NAME:  JANSON, LAMAR NO.:  [id number]   MEDICAL RECORD NO.:  [id number]          PATIENT TYPE:  INP   LOCATION:  1421                         FACILITY:  [facility]   PHYSICIAN:  Della Goo, M.D. DATE OF BIRTH:  1945/11/20   DATE OF ADMISSION:  02/23/2007  DATE OF DISCHARGE:                              HISTORY & PHYSICAL   PRIMARY CARE PHYSICIAN:  Reuben Likes, M.D.   CHIEF COMPLAINT:  Weakness, lethargy.   HISTORY OF PRESENT ILLNESS:  This is a 65 year old male who was found on  the floor by his wife when she came home.  She reports that the patient  does have episodes of his legs giving out secondary to his history of  spinal stenosis and has frequent falls; however, at this time the  patient could not get up and usually he is able to.  She reports that he  has had increasing weakness over the past few days and had a fever to  101.5 last evening.  The patient's wife contacted the primary care  physician and they were advised to report to the emergency department.   PAST MEDICAL HISTORY:  1. Spinal stenosis.  2. Recurrent aspiration, status post PEG tube placement.  3. Coronary artery disease.  4. Chronic headaches.  5. Asthma.  6. Gastroesophageal reflux disease.  7. Hypertension.  8. Increasing dysphasia,.  9. Hyperlipidemia.  10.The patient has a history of prostate cancer, status post TURP and      radiation therapy.   PAST SURGICAL HISTORY:  1. History of multiple lumbar surgeries including a lumbar fusion and      lumbar surgery secondary to ruptured disk.  2. TURP secondary to prostate cancer.  3. PTCA with stent placement.  4. Craniotomy with clipping of cerebral aneurysm.  5. Ulnar nerve release.  6. Cholecystectomy.   MEDICATIONS:  1. Zocor 10 mg one p.o. daily.  2. Metoclopramide 10 mg one p.o. q.a.m.  3. Hyomax SR 0.125 mg one p.o. b.i.d.  4. Mysoline 250 mg one p.o. q.a.m.  5. Toprol XL 50 mg one p.o. q.p.m.  6. Darvocet-N 100  1 tablet p.o. t.i.d. p.r.n.  7. Hydrocodone 5/500 2 tablets p.o. q.6h. p.r.n. severe pain.  8. Paxil 40 mg one p.o. q.h.s.  9. Lamictal 25 mg 2 tablets p.o. q.p.m.  10.Clonazepam 2 mg one p.o. b.i.d.  11.Trazodone 100 mg one p.o. q.h.s.  12.Depakote ER 500 mg 2 p.o. q.h.s.  13.Omeprazole 20 mg 2 tablets p.o. q.a.m.  14.Aspirin 81 mg one p.o. q.a.m.  15.Vitamin C 500 mg one p.o. q.a.m.  16.Zinc 50 mg one p.o. q.a.m.  17.L-carnitine 500 mg one p.o. b.i.d.  18.Vitamin D 1000 international units 2 tablets p.o. q.a.m.   ALLERGIES:  No known drug allergies.   SOCIAL HISTORY:  The patient is a smoker, smokes a half-pack per day,  and has a remote history of alcohol usage.   FAMILY HISTORY:  Noncontributory.   REVIEW OF SYSTEMS:  Pertinents are mentioned above.   PHYSICAL EXAMINATION FINDINGS:  This is a 65 year old lethargic, thin  male in acute distress.  VITAL SIGNS:  Temperature 97.5, blood pressure 96/70, heart rate 69,  respirations 16, O2 saturations 95% on room air.  HEENT:  Normocephalic, atraumatic.  Pupils equally round, reactive to  light.  Extraocular muscles are intact.  Funduscopic benign.  Oropharynx  is clear.  NECK:  Supple with full range of motion.  No thyromegaly, adenopathy or  jugular venous distention.  CARDIOVASCULAR:  Regular rate and rhythm.  No murmurs, gallops or rubs.  LUNGS:  Decreased breath sounds but no rales, rhonchi or wheezes.  ABDOMEN:  Positive bowel sounds, soft, nontender, positive PEG tube  present.  EXTREMITIES:  Without cyanosis, clubbing or edema.  NEUROLOGIC:  Generalized weakness.  The patient is arousable but  lethargic.  Speech:  His words are clear.  There are no focal deficits  on examination.   LABORATORY STUDIES:  White blood cell count 6.0, hemoglobin 13.1,  hematocrit 38.3, platelets 265, neutrophils 44% lymphocytes 43%.  Sodium  140, potassium 4.6, chloride 103, carbon dioxide 31, BUN 7, creatinine  1.0, and glucose 94.   Depakote level 47.2.  Arterial blood gas reveals a  pH of 7.395, a pCO2 of 45.1, pO2 of 55, a bicarb of 27 and O2  saturations of 88.2%.  Chest x-ray reveals a possible right middle lobe  atelectasis.   ASSESSMENT:  A 65 year old male being admitted with:   1. Febrile illness, probable aspiration pneumonia and early sepsis.  2. Weakness.  3. Hypotension.  4. Hypoxemia.   PLAN:  The patient will be admitted to a step-down unit area.  Has been  placed on IV antibiotic therapy of Zosyn.  A repeat chest x-ray will be  performed in the morning.  Nebulizer treatments and oxygen have been  ordered along with IV fluids for fluid resuscitation.  DVT and GI  prophylaxis will be ordered.  A repeat swallowing evaluation will be  requested.      Della Goo, M.D.  Electronically Signed     HJ/MEDQ  D:  02/24/2007  T:  02/25/2007  Job:  [id number]   cc:   Reuben Likes, M.D.  Fax: [phone]

## 2010-06-25 NOTE — H&P (Signed)
NAME:  Malik Zuniga, Malik Zuniga NO.:  [id number]   MEDICAL RECORD NO.:  [id number]          PATIENT TYPE:  INP   LOCATION:  1845                         FACILITY:  [facility]   PHYSICIAN:  Herbie Saxon, MDDATE OF BIRTH:  1945/09/29   DATE OF ADMISSION:  04/13/2007  DATE OF DISCHARGE:                              HISTORY & PHYSICAL   PRIMARY CARE PHYSICIAN:  Dr. Reuben Likes.   NEUROSURGEON:  Dr. Kathaleen Maser. Pool.   UROLOGIST:  Dr. Lindaann Slough.   GASTROENTEROLOGIST:  Dr. Petra Kuba.   PRESENTING COMPLAINT:  Shortness of breath, cough, fever for two days,  lethargy for one day.   HISTORY OF PRESENT ILLNESS:  This is a 65 year old Caucasian male who  was recently discharged on February 24, 2007, after being admitted for  aspiration pneumonia.  He has a history of recurrent aspiration and he  had a PEG tube removed in September 2008, after six weeks.  He has been  coughing.  Minimally productive in the last two days, associated with  increase in wheezing and shortness of breath.  He started having a high-  grade fever and chills last night, and he became more lethargic this  morning.  As stated, he was brought to the emergency room at [facility].   The patient initially ambulates with a walker at home, but presently is  unable to ambulate because of extreme weakness and comes in slumped in a  wheelchair.  His wife has noticed a reddish rash on his anterior  abdominal wall.  No new joint swelling.  No chest pain or hemoptysis.  He has constipation, for which he uses laxatives, but there has not been  any diarrhea.  The patient continues to smoke a half to one pack a day  of cigarettes.  He has been smoking for more than 45 years.  There are  no palpitations or paroxysmal nocturnal dyspnea.  The patient has  difficulty with urination.  He is status post prostate surgery.   PAST MEDICAL HISTORY:  1. Depression.  2. Prostate cancer.  3.  Frequent falls.  4. Hyperlipidemia.  5. Hypertension.  6. Spinal stenosis.  7. Gastroesophageal reflux disease.  8. Bronchial asthma.  9. Chronic headaches.  10.Coronary artery disease.   PAST SURGICAL HISTORY:  1. Craniotomy for a cerebral aneurysm which was clipped in July 1991.  2. Percutaneous transluminal coronary angiography with stent in March      2000, by Dr. Nanetta Batty.  3. A transurethral resection of the prostate with radiotherapy in July      1999, coordinated by Dr. Brunilda Payor.  4. Lumbar fusion in July 2006, coordinated by Dr. Jordan Likes.  5. A PEG tube inserted and removed between September 20, 2006, and      October 19, 2006, by Dr. Ewing Schlein.   SOCIAL HISTORY:  He has been disabled since 51.  He used to be a Audiological scientist.  He is married and has one child.  Continues to smoke  one pack a day.  A remote history of alcohol use.  No history of illicit  drug use.   FAMILY HISTORY:  Father died of prostate cancer.  Mother had disease.  Father had disease.   REVIEW OF SYSTEMS:  A 14-point review of systems pertinent for a history  as noted above.   ALLERGIES:  No known drug allergies.   CURRENT MEDICATIONS:  1. Aspirin 81 mg daily.  2. Clonazepam 2 mg twice daily.  3. Darvocet-N-100 one q.8h. p.r.n.  4. Depakote ER 1000 mg q.h.s.  5. Vicodin one q.6h. p.r.n.  6. HyoMax DT 0.125 mg q.h.s.  7. Lamictal 50 mg q.h.s.  8. Paxil 40 mg daily.  9. Prevacid 30 mg daily.  10.Primidone 250 mg daily.  11.Reglan 10 mg daily.  12.Cymbalta 20 mg daily.  13.Toprol 50 mg daily.  14.Trazodone 100 mg daily.  15.Vitamin C 500 mg daily.  16.Zinc sulfate 50 mg daily.   PHYSICAL EXAMINATION:  GENERAL:  He is an elderly man, acutely ill-  looking.  Clinically lethargic and somnolent.  VITAL SIGNS:  Temperature 98 degrees, pulse 83, respirations 22, blood  pressure 112/72.  HEENT:  Pupils equal and reactive to light and accommodation.  Oropharynx and nasopharynx clear.   Extraocular muscles intact.  NECK:  Supple, no elevated jugular venous distention.  LUNGS:  Bilateral transmitted coarse breath sounds.  ABDOMEN:  Soft, nontender.  No organomegaly.  Bowel sounds normoactive.  Inguinal orifices are patent.  NEUROLOGIC:  He is arousable.  Power is 4 in all limbs.  EXTREMITIES:  Peripheral pulses are present.  No pedal edema.   LABORATORY DATA:  Pending.   Chest x-ray and electrocardiogram are pending.   ASSESSMENT:  1. Clearly aspiration pneumonia, clinically.  2. Coronary artery disease.  3. Carcinoma of the prostate.  4. Ongoing tobacco abuse.  5. Hyperlipidemia.  6. Dysphagia, status post percutaneous esophageal gastrostomy tube.   PLAN:  1. The patient is being admitted to a telemetry bed.  2. Will keep him n.p.o. until gastroenterology re-evaluates for a      modified barium swallow.  3. Will start him on IV Rocephin and Zithromax.  Continue his home      medications after his ability to be able to take pills by mouth.      Will be n.p.o. except for medications.  4. Keep him on IV fluid D-5 half normal saline at 80 mL an hour.  5. Check his Hemoccult.  Cardiac enzymes, electrocardiogram serially      q.8h. x3.  thyroid function tests, repeat complete blood count and      coagulation panel and an electrocardiogram and chest x-ray, blood      culture, urinalysis and urine culture.  6. Lovenox 40 mg subcu daily for deep venous thrombosis prophylaxis      and Protonix 40 mg IV daily for stress prophylaxis.  Continue      Reglan and Phenergan for p.r.n. nausea.  DuoNebs q.6h. via      nebulizer.  Oxycodone 5 mg q.4-6h. p.r.n. pain.  Tylenol 650 mg      q.4h. p.r.n. fever.  7. Monitor his labs in the a.m.  8. Will also obtain his ABG on room air at baseline.  9. PT, OT and speech therapy input will be sought.      Herbie Saxon, MD  Electronically Signed     MIO/MEDQ  D:  04/13/2007  T:  04/13/2007  Job:  [id number]   cc:   Reuben Likes, M.D.

## 2010-06-25 NOTE — H&P (Signed)
NAME:  Malik Zuniga, SANTELLAN NO.:  [id number]   MEDICAL RECORD NO.:  [id number]          PATIENT TYPE:  INP   LOCATION:  5150                         FACILITY:  [facility]   PHYSICIAN:  Petra Kuba, M.D.    DATE OF BIRTH:  January 28, 1946   DATE OF ADMISSION:  04/11/2008  DATE OF DISCHARGE:                              HISTORY & PHYSICAL   Malik Zuniga is a very pleasant 65 year old male with a history of  dysphagia.  He experiences aspiration when he drinks liquids and is  being admitted to the hospital for observation during colonoscopy  preparation.  He will have an NG tube placed tonight with a colonoscopy  prep been poured down the NG tube in order to avoid aspiration.  Colonoscopy is for screening purposes and he has no acute illnesses at  this time.   PAST MEDICAL HISTORY:  Significant for:  1. Asthma.  2. Depression.  3. Hypertension.  4. History of MI with stent placement.  5. History of prostate cancer.  6. History of chronic back pain.  7. He has chronic neurological issues.  8. Has difficulty swallowing.  He had a PEG tube in place for a period      of time, but decided that he wanted it removed despite the fact      that he aspirates.   PAST SURGICAL HISTORY:  Significant for cholecystectomy in 1997, right  carpal tunnel syndrome in 1998, heart catheterization and stent  placement in March 2000, craniotomy for aneurysm in 1999, prostate  surgery, and back surgery x3.   FAMILY HISTORY:  Significant for his father with prostate cancer.  There  is no history of gastrointestinal cancers in the family.   SOCIAL HISTORY:  Positive for tobacco.  He smokes half pack a day.  No  alcohol.  No recreational drug use.   REVIEW OF SYSTEMS:  Negative.   CURRENT MEDICATIONS:  1. Divalproex 500 mg 2 per day.  2. Clonazepam 2 mg 1 tablet b.i.d.  3. Abilify 5 mg 1 tablet daily.  4. Paroxetine hydrochloride 40 mg 1 tablet p.o. daily.  5. Trazodone hydrochloride 100 mg 1  to 1-1/2 tablets p.o. daily.  6. Toprol-XL 50 mg 1 tablet per day.  7. Simvastatin 10 mg 1 tablet p.o. per day.  8. Primidone 250 mg 1 tablet p.o. per day.  9. Diazepam 5 mg p.r.n. anxiety.  10.Oxycodone 5/325 mg p.r.n. pain.  11.Hydrocodone 5/500 mg p.r.n. pain.  12.Omeprazole 40 mg 1 tablet daily.  13.He also takes an 81 mg aspirin.  14.Stool softener.  15.Vitamin D.  16.Zinc.  17.Vitamin C.  18.Fish oil.   ALLERGIES:  He has no known drug allergies.   PHYSICAL EXAMINATION:  GENERAL:  He is alert and oriented in no apparent  distress.  HEART:  Regular rate and rhythm.  LUNGS:  Crackles bilaterally.  ABDOMEN:  Soft, nontender, and nondistended with good bowel sounds.   LABORATORIES:  Pending.  A CBC and BMET has been ordered for in the  morning.  There are no radiological exams.   ASSESSMENT:  A 65 year old  male in for observation during colonoscopy  prep secondary to his history of dysphagia and aspiration.  We will plan  for colonoscopy tomorrow at approximately 8:30 a.m. and hopefully  discharge immediately after.      Stephani Police, PA    ______________________________  Petra Kuba, M.D.    MLY/MEDQ  D:  04/11/2008  T:  04/12/2008  Job:  [id number]
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
## 2010-06-25 NOTE — Op Note (Signed)
NAME:  Malik Zuniga, Malik Zuniga NO.:  [id number]   MEDICAL RECORD NO.:  [id number]          PATIENT TYPE:  OIB   LOCATION:  6741                         FACILITY:  [facility]   PHYSICIAN:  Petra Kuba, M.D.    DATE OF BIRTH:  11-Jul-1945   DATE OF PROCEDURE:  DATE OF DISCHARGE:                               OPERATIVE REPORT   PROCEDURE:  Peripherally inserted central catheter placement.   INDICATIONS:  Significant swallowing problems, at risk for aspiration.   Consent was signed after risks, benefits, methods, options thoroughly  discussed multiple times in the past with both the patient and his wife.   MEDICINES USED:  Fentanyl 100 mcg, Versed 10 mg.   PROCEDURE:  The video endoscope was inserted by direct vision.  The  esophagus was normal.  He did have a tiny hiatal hernia.  The scope  passed into the stomach, advanced through a normal antrum, normal  pylorus, into a normal duodenal bulb and around the celiac to a normal  second portion of the duodenum.  The scope was slowly withdrawn back to  the bulb.  A good look there ruled out abnormalities in that location.  The scope was withdrawn back to the stomach and retroflexed.  Angularis,  cardia, fundus, lesser and greater curve were normal on retroflexed  visualization except for the tiny hiatal hernia being confirmed in the  cardia.  Straight visualization of the stomach did not reveal any  additional finding.  The scope was slowly withdrawn back to the first  esophageal sphincter, again, confirming the normal esophagus.  The scope  was then advanced in the customary position on the distal greater curve,  a light reflex seen in the left upper quadrant, one to one finger  palpation confirmed the position while the abdomen was sterilely  dressed, draped and shaved.  The snare was advanced through the scope.  Using 1% lidocaine as anesthesia, a small wheal was made.  We then made  a small incision with the blade.  The  introducer needle advanced into  the stomach on the first attempt.  The blue Ponsky pull cord was  advanced through the introducer, grabbed with the snare and the cord  snare and scope were withdrawn through the patient's mouth.  The 24-  [place] Ponsky style pull PEG was attached to the cord, withdrawn through  the abdominal incision in the customary fashion.  The scope was  reinserted.  There was minimal trauma to the esophagus.  The bumper of  the PEG of the confirmed in the stomach.  The proper tension of the  bumper was confirmed with placing the external bumper and turning.  Air  was suctioned, scope was slowly withdrawn.  Patient tolerated the  procedure well.  There was no obvious immediate complication.   ENDOSCOPIC DIAGNOSES:  1. Tiny hiatal hernia.  2. Otherwise normal esophagogastroduodenoscopy therapy 24 [place]      Ponsky style pull percutaneous endoscopic gastrostomy placed in the      customary fashion without obvious complication or problem.   PLAN:  Customary post-PEG orders.  We will  ask nutrition and swallowing  team to see him as well as home health care.  Resume home medicines.  Hopefully he will be able to go home soon.           ______________________________  Petra Kuba, M.D.     MEM/MEDQ  D:  11/03/2006  T:  11/03/2006  Job:  [id number]   cc:   Lyn Records, M.D.  Wenda Low. Love, M.D.
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
## 2010-06-25 NOTE — Op Note (Signed)
NAME:  Malik Zuniga, Malik Zuniga NO.:  [id number]   MEDICAL RECORD NO.:  [id number]          PATIENT TYPE:  INP   LOCATION:  5150                         FACILITY:  [facility]   PHYSICIAN:  Petra Kuba, M.D.    DATE OF BIRTH:  Dec 10, 1945   DATE OF PROCEDURE:  04/12/2008  DATE OF DISCHARGE:  04/12/2008                               OPERATIVE REPORT   PROCEDURE:  Colonoscopy with polypectomy.   INDICATION:  History of colon polyps, due for repeat screening.  Consent  was signed after risks, benefits, methods, and options are already  discussed multiple times in the past.   MEDICATIONS:  1. Fentanyl 75 mcg.  2. Versed 6 mg.   PROCEDURES:  Rectal inspection was pertinent for external hemorrhoid,  small.  Digital exam was negative.  The pediatric video colonoscope was  inserted, and with abdominal pressure easily bends around the colon to  the cecum.  Other than some sigmoid diverticula, no abnormalities were  seen on insertion.  The cecum was then identified by the appendiceal  orifice and the ileocecal valve.  The scope was slowly withdrawn.  Prep  was adequate.  There was some liquid stool that required washing and  suctioning, and on slow withdrawal through the colon, the cecum and the  ascending were normal.   In the more proximal transverse colon, a small semi-sessile polyp was  seen, snare electrocautery applied, suctioned through the scope, and  collected in the trap.  The scope was further withdrawn.  In the distal  transverse, another small semi-sessile polyp was seen, snare  electrocautery applied, suctioned through the scope, and collected it in  the trap.  Scope was further withdrawn.  The scattered few sigmoid  diverticula were confirmed.  His sigmoid was tortuous, but no other  abnormalities were seen as we slowly withdrew back to the rectum.  Anorectal pull-through and retroflexion confirmed some small  hemorrhoids.  The scope was straightened and re-advanced  to the left  side of the colon.  Air was suction, scope removed.  The patient  tolerated the procedure well.  There was no obvious immediate  complication.   ENDOSCOPIC DIAGNOSES:  1. Internal and external small hemorrhoids.  2. Sigmoid, few diverticula and tortuosity.  3. Two small transverse polyps snared.  4. Otherwise, within normal limits to the cecum.   PLAN:  Await pathology.  Probably if doing well medically, repeat colon  screening in 5 years.  We will see back p.r.n. or in 6 months just to  check on him.  We will need to send the copy of this report to his  primary care physician.  When we get the report, we will send that,  since unfortunately I do not have that at this time.           ______________________________  Petra Kuba, M.D.     MEM/MEDQ  D:  04/12/2008  T:  04/12/2008  Job:  [id number]   cc:   Evie Lacks, MD

## 2010-06-25 NOTE — Discharge Summary (Signed)
NAME:  Malik Zuniga, Malik Zuniga NO.:  [id number]   MEDICAL RECORD NO.:  [id number]          PATIENT TYPE:  INP   LOCATION:  3014                         FACILITY:  [facility]   PHYSICIAN:  Kathaleen Maser. Pool, M.D.    DATE OF BIRTH:  February 23, 1945   DATE OF ADMISSION:  04/24/2006  DATE OF DISCHARGE:  04/27/2006                               DISCHARGE SUMMARY   SERVICE:  Neurosurgery.   FINAL DIAGNOSES:  L4-5 and L5-S1 degenerative disease with stenosis.   OPERATION:  Treatment with L4-5 and L5-S1 decompression and fusion with  instrumentation.   HISTORY OF PRESENT ILLNESS:  [name] is a 65 year old male with  history of back and right lower extremity pain, failing all efforts at  conservative management.  The patient has evidence of marked disk  degeneration and stenosis with evidence of previous surgery on the right  side at L4-5 and L5-S1.  The patient presents now for two-level  decompression and fusion with instrumentation.   HOSPITAL COURSE:  The patient was taken to the operating room, and  uncomplicated L4-5 and L5-S1 decompression and fusion with  instrumentation were performed.  Postoperatively, the patient awakened  with resolution of his right lower extremity pain.  Back pain was  initially considerable but gradually eased.  He was mobilized with the  assistance of physical and occupational therapy.  At the time of  discharge, the patient was ambulating well with no assistance.  His  wound is healing well.  He is tolerating a regular diet.  He feels much  improved from his preoperative state.   CONDITION ON DISCHARGE:  Improved.  The patient will be discharged home  with home physical therapy and occupational therapy.  I will plan on  seeing him back in 1 week.           ______________________________  Kathaleen Maser. Pool, M.D.     HAP/MEDQ  D:  06/30/2006  T:  06/30/2006  Job:  [id number]

## 2010-06-28 NOTE — Op Note (Signed)
NAME:  Malik Zuniga, Malik Zuniga NO.:  [id number]   MEDICAL RECORD NO.:  [id number]          PATIENT TYPE:  INP   LOCATION:  2899                         FACILITY:  [facility]   PHYSICIAN:  Kathaleen Maser. Pool, M.D.    DATE OF BIRTH:  June 08, 1945   DATE OF PROCEDURE:  08/14/2004  DATE OF DISCHARGE:                                 OPERATIVE REPORT   SERVICE:  Neurosurgery.   PREOPERATIVE DIAGNOSES:  Right L4-L5 stenosis with radiculopathy.   POSTOPERATIVE DIAGNOSES:  Right L4-L5 stenosis with radiculopathy.   PROCEDURE:  Right L4-L5 decompressive laminotomy with foraminotomy.   SURGEON:  Kathaleen Maser. Pool, M.D.   ASSISTANT:  Donalee Citrin, M.D.   ANESTHESIA:  General endotracheal.   INDICATIONS FOR PROCEDURE:  [name] is a 65 year old male with a history  of severe back and right lower extremity pain consistent with a right sided  L5 radiculopathy.  The patient has failed conservative management.  Workup  demonstrates evidence of significant spondylosis, worse on  the right side  at L4-L5 with compression of the right sided L5 nerve root.  The patient  presents now for a right sided L4-L5 decompressive laminotomy in hopes of  improving his symptoms.   DESCRIPTION OF PROCEDURE:  The patient was brought to the operating room and  placed on the table in a supine position.  After an adequate level of  anesthesia was achieved, the patient was positioned prone on the Wilson  frame and properly padded for operation in the lumbar region.  He was  prepped and draped sterilely.  A 10 blade was used to make a linear skin  incision overlying the L4-L5 interspace.  This was carried down sharply in  the midline.  A subperiosteal dissection was then performed exposing the  lamina and facet joints of L4 and L5.  Deep self-retaining retractor was  placed.  Interoperative x-ray was taken and, in fact, the L5-S1 level had  been exposed.  Dissection proceeded one level cephalad.  Retractor was  replaced.  Laminotomy was then performed using the high speed drill and  Kerrison rongeurs to remove the inferior aspect of the lamina of L4, medial  aspect of the L4-L5 facet joint, and the superior rim of the L5 lamina.  The  ligamentum flavum was then elevated and resected in a piecemeal fashion  using Kerrison rongeurs.  The thecal sac and exiting L5 nerve root were  visualized.  The microscope was brought onto the field and used for  microdissection of the right sided L5 nerve root.  A wide decompressive  foraminotomy was then performed along the course of the exiting L5 nerve  root.  The disc space was inspected and found to be flat.  There is no  evidence of any residual stenosis.  The wound was then irrigated with  antibiotic solution.  Gelfoam was placed topically and hemostasis found to  be good.  The microscope and retractor system  were removed.  Hemostasis in the muscle was achieved with electrocautery.  The wound was closed in layers with Vicryl sutures.  Steri-Strips and  sterile  dressing were applied.  There were no apparent complications.  The  patient tolerated the procedure well and he returns to the recovery room  postoperatively.       HAP/MEDQ  D:  08/14/2004  T:  08/14/2004  Job:  [id number]
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
## 2010-06-28 NOTE — Op Note (Signed)
[facility]  Patient:    Malik Zuniga, Malik Zuniga. Visit Number: [id number] MRN: [id number]          Service Type: Attending:  Lindaann Slough, M.D. Dictated by:   Lindaann Slough, M.D. Proc. Date: 05/03/01   CC:         Reuben Likes, M.D.  Sigmund I. Patsi Sears, M.D.   Operative Report  PREOPERATIVE DIAGNOSIS:  Adenocarcinoma of prostate.  POSTOPERATIVE DIAGNOSIS:  Adenocarcinoma of prostate.  PROCEDURE:  Bilateral pelvic lymphadenectomy and radical retropubic prostatectomy.  SURGEON:  Lindaann Slough, M.D.  ASSISTANT:  Sigmund I. Patsi Sears, M.D.  ANESTHESIA:  General.  INDICATION:  The patient is a 65 year old male, who had a PSA of 5.6 in January 2003.  He was treated with antibiotics, and a repeat PSA was 4.6 with free PSA of 8.4%.  Prostate biopsy was positive for adenocarcinoma, Gleason score of 6.  Treatment options were discussed with the patient and his wife, and we opted for radical prostatectomy.  The risks and benefits were discussed with them.  We understand and are agreeable.  DESCRIPTION OF PROCEDURE:  Under general anesthesia, the patient was prepped and draped and placed in the supine position.  A #24 Foley catheter was inserted in the bladder.  A longitudinal incision was made in the suprapubic area.  The incision was carried down to the rectus fascia which was then incised.  The recti muscles were split in the midline.  The iliac fossae were then entered.  Bilateral pelvic lymphadenectomy was done using the bladder, the pelvic sidewall, and obturator fossa, and the iliac vessels as landmarks. Then the endopelvic fascia was incised from the apex to the base of the prostate.  Then the puboprostatic ligaments were incised.  A Hohenfellner clamp was passed behind the dorsal vein complex, and the dorsal vein complex was doubly ligated with #0 Vicryl.  Then the dorsal vein complex was cut in between ligatures.  The anterior urethra  was then dissected, and a Foley catheter was pulled through the urethra.  The posterior urethra was then bluntly dissected from the rectum and incised.  The rectourethralis was incised, and the apex of the prostate was bluntly and sharply dissected from the anterior wall of the rectum.  Then the lateral pedicles were ligated with #0 silk and cut in between ligatures.  The Denonvilliers fascia was incised, and the vas deferens was then dissected from the surrounding tissues and ligated with #0 chromic and cut in between ligatures on both sides.  The seminal vesicles were then dissected from the surrounding tissues.  Then the anterior bladder neck was incised, and the Foley catheter was pulled through the incision.  Then the posterior bladder neck was incised, and the bladder was dissected from the seminal vesicles, and the specimen was removed in toto. The bladder neck was then closed down through a 24 French size Foley catheter. Then the mucosa was everted with 4-0 Vicryl.  Posterior bladder neck was then approximated to the posterior urethra with 2-0 Monocryl.  Then a #20 Foley catheter was inserted through the urethra into the bladder, and the anterior bladder neck was approximated to the anterior urethra with 2-0 Monocryl using running sutures.  The wound was then irrigated with normal saline.  A Blake drain was placed in the wound and brought out through a separate stab wound. The fascia was then closed with 0 PDS, and the skin was closed with 4-0 Monocryl using subcuticular sutures.  Needle, sponge, and instrument counts were  correct on two occasions.  Estimated blood loss was 350 cc.  The patient tolerated the procedure well and left the OR in satisfactory condition to postanesthesia care unit. Dictated by:   Lindaann Slough, M.D. Attending:  Lindaann Slough, M.D. DD:  05/03/01 TD:  05/03/01 Job: [id number]

## 2010-06-28 NOTE — H&P (Signed)
[facility]  Patient:    Malik Zuniga, Malik Zuniga Visit Number: [id number] MRN: [id number]          Service Type: SUR Location: 3W 0373 01 Attending Physician:  Lindaann Slough Dictated by:   Lindaann Slough, M.D. Admit Date:  05/03/2001                           History and Physical  CHIEF COMPLAINT:  Adenocarcinoma of the prostate.  HISTORY OF PRESENT ILLNESS:  The patient is a 65 year old male who was found to have a PSA of 5.6 in January 2003.  Repeat PSA was 4.6 with free PSA of 8.4%.  Prostate biopsy was positive for adenocarcinoma, Gleason score 6. Treatment options were discussed with the patient and his wife and they opted for a radical prostatectomy.  He is admitted today for the procedure.  PAST MEDICAL HISTORY:  1. He has a history of hypertension  2. Angina.  3. He is status post cholecystectomy in 1997.  4. He had a cardiac catheterization with stent placement in 2000.  5. He had a craniotomy for cerebral aneurysm in 1999.  6. He had carpal tunnel surgery in 1998.  FAMILY HISTORY:  Positive for hypertension.  His mother died of a heart attack at the age of 35.  His father died of a brain tumor at age 45.  His father also had prostate cancer and his maternal grandmother had diabetes.  SOCIAL HISTORY:  He is married, has one child.  He has smoked half a pack a day for 38 years.  He is a social drinker.  ALLERGIES:  No known drug allergies.  MEDICATIONS:  1. He is on Adderall XR 10 mg p.r.n.  2. Klonopin 1 mg p.o. daily.  3. Depakote ER 500 mg two tablets p.o. h.s.  4. Diovan 80 mg p.o. daily.  5. Lexapro 5 mg p.o. daily.  6. Lipitor 10 mg daily.  7. Paxil 40 mg p.o. daily.  8. Toprol XL 25 mg p.o. daily.  9. Allegra 180 mg p.r.n. 10. Darvocet-N 100 one p.r.n. 11. NitroQuick SL 0.4 mg p.r.n. 12. Aspirin 81 mg p.o. daily.  REVIEW OF SYSTEMS:  He is hard of hearing and wears hearing aids.  There is no cough, no shortness of breath,  no hemoptysis.  CARDIOVASCULAR:  No palpitations, no chest pain.  GASTROINTESTINAL:  No nausea, no vomiting, no diarrhea, or constipation.  GENITOURINARY:  As per history.  PHYSICAL EXAMINATION:  GENERAL:  This is a well-built 65 year old male in no acute distress.  VITAL SIGNS:  Blood pressure is 108/66, pulse 90, respirations 16, temperature 96.9.  HEENT:  His head is normal.  He wears hearing aids.  Pupils are equal and reactive to light and accommodation.  NECK:  Supple.  No cervical lymph nodes, no thyromegaly.  CHEST:  Symmetrical.  Lungs fully expanded and clear to percussion and auscultation.  HEART:  Regular rate and rhythm, no murmur, no gallops.  ABDOMEN:  Soft, nondistended, nontender.  Liver, spleen, and kidneys not palpable.  No organomegaly.  Bowel sounds normal.  GENITALIA:  Penis is circumcised.  Meatus is normal.  Scrotum is unremarkable. Testicles, cords and epididymis are within normal limits.  EXTREMITIES:  Within normal limits.  No pedal edema.  No deformities.  Good peripheral pulses.  RECTAL:  On rectal examination, sphincter tone is normal.  Prostate is enlarged and 40 g, no nodules.  ADMITTING DIAGNOSES: 1. Adenocarcinoma of the  prostate. 2. Hypertension. 3. Angina. Dictated by:   Lindaann Slough, M.D. Attending Physician:  Lindaann Slough DD:  05/03/01 TD:  05/03/01 Job: [id number]

## 2010-06-28 NOTE — Discharge Summary (Signed)
NAME:  KELCEY, WICKSTROM NO.:  [id number]   MEDICAL RECORD NO.:  [id number]          PATIENT TYPE:  INP   LOCATION:  3001                         FACILITY:  [facility]   PHYSICIAN:  Kathaleen Maser. Pool, M.D.    DATE OF BIRTH:  Mar 13, 1945   DATE OF ADMISSION:  06/25/2007  DATE OF DISCHARGE:  06/30/2007                               DISCHARGE SUMMARY   FINAL DIAGNOSIS:  L3-L4 stenosis status post L4-S1 fusion.   HISTORY OF PRESENT ILLNESS:  Mr. Hellenbrand is a 65 year old male with  intractable back pain.  The patient's workup demonstrates evidence of  stenosis at the L3-L4 level above the level of his previous effusions.  The patient presents now for L3-L4 decompression and fusion.   HOSPITAL COURSE:  The patient taken to operating room where  uncomplicated revision and extension of his fusion was performed.  Postoperatively, the patient's back and lower extremity pain were  improved.  Sensation were stable.  Wound was healing well.  The patient  was gradually mobilized.  He was ready for discharge home.   CONDITION ON DISCHARGE:  Improved.   DISCHARGE DISPOSITION:  The patient to be discharged home.  Follow up in  my office in 1 week.           ______________________________  Kathaleen Maser. Pool, M.D.     HAP/MEDQ  D:  08/03/2007  T:  08/04/2007  Job:  [id number]

## 2010-06-28 NOTE — Op Note (Signed)
NAME:  Malik Zuniga, BELT NO.:  [id number]   MEDICAL RECORD NO.:  [id number]          PATIENT TYPE:  INP   LOCATION:  3172                         FACILITY:  [facility]   PHYSICIAN:  Kathaleen Maser. Pool, M.D.    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   PROCEDURE NOTE:  Bilateral L4-L5 and L5-S1 redo laminectomies and  foraminotomies, more than would be required for simple interbody fusion  alone.  L4-L5 and L5-S1 posterior lumbar interbody fusion utilizing  Tangent interbody allograft wedge, PEEK cage, and local autografting.  L4 to S1 posterolateral arthrodesis utilizing segmental pedicle  fixation.   SURGEON:  Kathaleen Maser. Pool, M.D.   ASSISTANT:  Reinaldo Meeker, M.D.   ANESTHESIA:  General endotracheal anesthesia.   INDICATIONS FOR PROCEDURE:  Malik Zuniga is a 65 year old male with a  history of severe back and right lower extremity pain failing  conservative management.  The patient is status post previous right  sided L4-L5 and right side L5-S1 laminotomies and discectomies.  The  patient presents now with evidence of disc space collapse and lateral  translation of L4 on L5 and to a lesser degree at L5 on S1.  The patient  has been counseled as to his options.  He has decided to proceed with L4-  L5 and L5-S1 decompression and fusion with instrumentation in hopes of  improving his symptoms.   OPERATIVE NOTE:  The patient was taken to the operating table in a  supine position. After adequate anesthesia was achieved, the patient was  positioned prone onto the Wilson frame and appropriately padded.  The  patient was prepped and draped prepped and sterilely.  A 10 blade was  used to make a linear incision overlying the L3, L4, L5, S1 levels.  This was carried down sharply in the midline.  Subperiosteal dissection  was performed to the lamina and facet joints of L3, L4, L5, and S1, as  well as the transverse processes of L4 and L5 and the sacral ala  bilaterally.  A deep self-retaining retractor was placed.  Intraoperative fluoroscopy was used and the levels were confirmed.  The  previous laminotomies on the right at L4-L5 and L5-S1 were dissected  free.  Complete laminectomies of L4 and L5 were performed bilaterally.  The superior laminectomy at S1 was performed bilaterally.  Inferior  facetectomies of L4 and L5 were performed bilaterally.  Superior  facetectomies at L5 and S1 was performed bilaterally.  All bone is  cleaned and used in later autografting.  The underlying thecal sac and  exiting L4, L5 and S1 nerve roots were identified.  Wide foraminotomies  were performed along the long course of the exiting nerve roots  bilaterally.  Epidural venous plexus was coagulated and cut.  Starting  first at L4-L5 on the left side, the thecal sac and nerve roots were  mobilized and retracted towards the midline.  The disc space was then  isolated and incised with a 15 blade in a rectangular fashion.  A wide  disc space clean out was achieved using pituitary rongeurs, upward  angled pituitary rongeurs, and Epstein curets.  The procedure was then  repeated on the contralateral side.  The disc space was then  sequentially dilated up to 10 mm with the 10 mm distractor left in the  patient's left side.  The thecal sac and nerve roots were protected on  the right side.  The disc space was then reamed and then cut with 10 mm  Tangent instruments.  Soft tissue was removed from the interspace.  A 10  x 26 mm Tangent wedge was then impacted into place and recessed  approximately 3 mm in the posterior  cortical margin of L4.  The  distractor was moved to the patient's left side.  The thecal sac and  nerve roots were protected on the left side.  The disc space was then  reamed and cut with 10 mm Tangent instruments.  The soft tissues were  removed from the interspace.  The disc space was further curettaged.  Morselized autograft mixed with DBX putty was then packed in the  interspace.  A 10 by 26 mm Telamon cage packed with autograft was then  impacted into place and recessed approximately 2 mm in the posterior  cortical margin.  The procedure was repeated at L5-S1 without  complication.  The pedicles at L4, L5, and S1 were then identified using  superficial landmarks and intraoperative fluoroscopy.  Superficial bone  was then removed using the high speed drill.  Each pedicle was then  probed using the pedicle awl.  Each pedicle awl track was then tapped  with 5.25 mm screw tap, each screw hole was probed and found to be solid  in bone.  6.75 x 45 mm radius screws from [employer] were used bilaterally  at L4, 6.75 x 40 mm screws were used bilaterally at L5 and S1.  The  transverse processes of L4, L5 and the sacral ala, were then  decorticated using a high speed drill bilaterally.  Morselized autograft  was packed posterolaterally.  A titanium rod was then placed through the  screw heads bilaterally.  Locking caps were then placed through the  screw heads and tightened with the construct under compression.  A  transverse connector was placed.  Final images revealed good position of  bone grafts and hardware.  The wound was then irrigated one final time.  Gelfoam was placed topically for hemostasis.  A medium Hemovac drain was  left in the epidural space.  The wound was then closed in layers with  Vicryl sutures.  Steri-Strips and sterile dressing were applied.  There  was no apparent complication.  The patient tolerated the procedure well and he returns to the recovery room for postoperative  care.           ______________________________  Kathaleen Maser. Pool, M.D.     HAP/MEDQ  D:  04/24/2006  T:  04/25/2006  Job:  [id number]

## 2010-06-28 NOTE — Op Note (Signed)
NAME:  Malik Zuniga, Malik Zuniga               ACCOUNT NO.:  [id number]   MEDICAL RECORD NO.:  [id number]          PATIENT TYPE:  AMB   LOCATION:  DFTL                         FACILITY:  [facility]   PHYSICIAN:  Henry A. Pool, M.D.    DATE OF BIRTH:  January 05, 1946   DATE OF PROCEDURE:  02/25/2005  DATE OF DISCHARGE:                                 OPERATIVE REPORT   SERVICE:  Neurosurgery.   PREOPERATIVE DIAGNOSES:  Right L5-S1 herniated nucleus pulposus with  radiculopathy.   POSTOPERATIVE DIAGNOSES:  Right L5-S1 herniated nucleus pulposus with  radiculopathy.   PROCEDURE:  Right L5-S1 laminotomy and microdiscectomy.   SURGEON:  Kathaleen Maser. Pool, M.D.   ASSISTANT:  Reinaldo Meeker, M.D.   ANESTHESIA:  General endotracheal.   INDICATIONS FOR PROCEDURE:  Malik Zuniga is a 65 year old male status post  previous right sided L4-L5 decompressive laminotomy who presents now with  recurrent right lower extremity radicular pain.  Workup demonstrates  evidence of a right sided L5-S1 disc herniation with a superior free  fragment causing compression of the right sided L5 nerve root.  The patient  has failed conservative management.  He presents now for microdiscectomy in  hopes of improving his symptoms.   DESCRIPTION OF PROCEDURE:  The patient was brought to the operating room and  placed on the table in a supine position.  After an adequate level of  anesthesia was achieved, the patient was positioned prone on the Wilson  frame and properly padded for operation in the lumbar region.  He was  prepped and draped sterilely.  A 10 blade was used to make a linear skin  incision overlying the L5-S1 interspace.  This was carried down sharply in  the midline.  Subperiosteal dissection was performed exposing the lamina and  facet joints at L5 and S1.  Deep self-retaining retractors were placed.  Interoperative x-ray was taken and the level was confirmed.   The laminotomy was then performed using the high speed  drill and Kerrison  rongeurs to remove the inferior aspect of the lamina of L5, medial aspect of  the L5-S1 facet joint, and the superior rim of the S1 lamina.  The  ligamentum flavum was then elevated and resected in a piecemeal fashion  using Kerrison rongeurs.  The underlying thecal sac and exiting S1 nerve  root were identified.  The laminotomy was widened and extended superiorly  until the right sided L5 nerve root was also identified.  The microscope was  brought on the field and used throughout the remainder of the discectomy.  The epidural venous plexus was coagulated and cut.  A moderately sized free  fragment of disc herniation just beneath the L5 nerve root on the right side  was encountered.  This was resected using pituitary rongeurs.  After this  was performed, the nerve root, itself, was completely decompressed.  The  disc space was examined and found to be quite flat.  It was not felt any  kind of annular incision would be beneficial.  At this point, the canal was  inspected.  There was no evidence  of any injury to the thecal sac or nerve  roots.  There was no evidence of any compression of the thecal sac and nerve  roots.  The wound was then irrigated with antibiotic solution, Gelfoam was  placed topically for hemostasis which was found to be good.  The microscope  and retractor  system were removed.  Hemostasis in the muscle was achieved with  electrocautery.  The wounds were closed in layers with Vicryl sutures.  Steri-Strips and sterile dressing were applied.  There were no  complications.  The patient tolerated the procedure well and he was returned  to the recovery room in stable condition.           ______________________________  Kathaleen Maser Pool, M.D.     HAP/MEDQ  D:  02/25/2005  T:  02/25/2005  Job:  [id number]

## 2010-06-28 NOTE — Procedures (Signed)
[place]  Patient:    Malik Zuniga, Malik Zuniga Visit Number: [id number] MRN: [id number]          Service Type: END Location: ENDO Attending Physician:  Nelda Marseille Dictated by:   Petra Kuba, M.D. Proc. Date: 07/26/01 Admit Date:  07/26/2001   CC:         Reuben Likes, M.D.  Darci Needle, M.D.   Procedure Report  PROCEDURE PERFORMED:  Colonoscopy.  ENDOSCOPIST:  Petra Kuba, M.D.  INDICATIONS FOR PROCEDURE:  The patient with history of colon polyps, due for repeat screening, some change in bowel habits.  Consent was signed after risks, benefits, methods, and options were thoroughly discussed with multiple times in the past.  MEDICATIONS USED:  Demerol 100 mg, Versed 10 mg.  DESCRIPTION OF PROCEDURE:  Rectal inspection was pertinent for external hemorrhoids.  Digital exam was negative.  Video colonoscope was inserted and fairly easily advanced around the colon to the cecum.  This did require rolling him on his back and some abdominal pressure.  No obvious abnormality was seen on insertion.  The cecum was identified by the appendiceal orifice and the ileocecal valve.  In fact the scope was inserted a short ways into th terminal ileum which was normal.  Photodocumentation was obtained.  Prep was fairly adequate.  It did require lots of washing and suctioning for adequate visualization but on slow withdrawal through the colon, no polyps, masses or diverticula were seen as we slowly withdrew back to the rectum.  Once back in the rectum, the scope was retroflexed, pertinent for some internal hemorrhoids.  The scope was straightened and readvanced a short ways up the left side of the colon.  Air was suctioned, scope removed.  The patient tolerated the procedure well.  There was no obvious immediate complication.  ENDOSCOPIC DIAGNOSIS: 1. Internal and external small hemorrhoids. 2. Otherwise within normal limits to the terminal  ileum.  PLAN:  GI follow-up p.r.n.   Return care to Dr. [name] for the customary health care maintenance to include yearly rectals and guaiacs and consider repeat screening in five years unless needed sooner p.r.n. Dictated by:   Petra Kuba, M.D. Attending Physician:  Nelda Marseille DD:  07/26/01 TD:  07/27/01 Job: [id number]

## 2010-06-28 NOTE — H&P (Signed)
[place]  Patient:    Malik Zuniga, Malik Zuniga Visit Number: [id number] MRN: [id number]          Service Type: END Location: ENDO Attending Physician:  Nelda Marseille Dictated by:   Darci Needle, M.D. Admit Date:  07/26/2001 Discharge Date: 07/26/2001   CC:         Melvyn Neth D. Clovis Riley, M.D.   History and Physical  INCOMPLETE  REFERRING PHYSICIAN:  Dr. Lupe Carney.  REASON FOR ADMISSION:  Syncope.  HISTORY OF PRESENT ILLNESS:  The patient is 77 and has a history of coronary artery disease.  He is status post right coronary stent implantation March 2000.  He has been stable from a cardiac standpoint since that time.  He was in the process of starting his bowel prep for a colonoscopy on July 25, 2001, when he had some mild chest discomfort and took nitroglycerin around noon time.  He was okay with this.  Later that evening, he had another episode of mild chest discomfort as he has over the past three years.  Since then, he has had nitroglycerin that will relieve the discomfort. Dictated by:   Darci Needle, M.D. Attending Physician:  Nelda Marseille DD:  07/26/01 TD:  07/26/01 Job: [id number]

## 2010-06-28 NOTE — Discharge Summary (Signed)
NAME:  Malik Zuniga, Malik Zuniga               ACCOUNT NO.:  [id number]   MEDICAL RECORD NO.:  [id number]          PATIENT TYPE:  INP   LOCATION:  1421                         FACILITY:  [facility]   PHYSICIAN:  Malik I Elsaid, MD      DATE OF BIRTH:  21-May-1945   DATE OF ADMISSION:  02/23/2007  DATE OF DISCHARGE:  02/25/2007                               DISCHARGE SUMMARY   DISCHARGE DIAGNOSES:  1. Right lower lobe pneumonia, suspect aspiration.  2. Severe dysphagia.  The patient and family reluctant for any      artifical feeding.  3. History of tremor.  4. Hypotension, resolved.  5. Chronic back pain.  6. Status post fall secondary to spinal stenosis and chronic gait      problems.  7. Coronary artery disease, status post stent.  8. Depression.  9. Tobacco abuse.   DISCHARGE MEDICATIONS:  1. Avelox 400 mg daily for 2 weeks.  2. Zocor 10 mg p.o. daily.  3. Reglan 10 mg daily.  4. Toprol XL 25 mg daily.  5. Darvocet 1-2 tabs p.o. q.4-6 h. p.r.n.  6. Paxil 40 mg p.o. at bedtime.  7. Hyomax 0.125 mg p.o. b.i.d.  8. Lamictal 25 mg two tabs p.o. q.p.m.  9. Clonazepam 2 mg p.o. b.i.d.  10.Trazodone 100 mg p.o. nightly  11.Depakote 500 mg two tabs p.o. nightly  12.Omeprazole 20 mg p.o. every morning  13.Aspirin 81 mg p.o. daily.  14.Vitamin C 500 mg p.o. daily.  15.Zinc 500 mg p.o. every morning.   PROCEDURES:  Chest x-ray:  Subsegmental atelectasis in the right middle  lobe, no evidence of a pneumonia.  Chest x-ray with right lower lobe  pneumonia.   CONSULTATIONS:  None.   Discharged with home health PT and OT.   HISTORY OF PRESENT ILLNESS:  Please review the history done by Dr.  Della Goo.  A [age]-year-old male found on the floor by his wife.  The patient does have an episode of his leg giving out secondary to his  history of a spinal stenosis and frequent falls.  She reports that he  had increased weakness over the past few days and had fever or 101.5.  The patient was  admitted for further evaluation.  In the ER the patient  was found to have low blood pressure.  The patient admitted to stepdown  for evaluation.  He was placed on IV antibiotics.  A repeat chest x-ray  showed evidence of right lower lobe pneumonia suspicious for aspiration  pneumonia.  For that a swallow speech evaluation was done.  the patient  was found to have severe dysphagia and despite that the patient  understands his risk of aspiration and the wife also fully understands.  They would like the patient to eat whatever he likes despite the risk of  aspiration.  Accordingly during the hospitalization, fever subsided and  no evidence of leukocytosis.  The patient will continue with Avelox for  2 weeks.  Hypotension improved.  The patient will be discharged with Toprol XL 25  mg p.o. daily, to resume his Toprol XL  50 mg within 1-2 weeks after  review by his primary care physician.  We thought the patient was  medically stable to be discharged home.   The patient needs to follow with his primary care physician and Dr. Sandria Manly  who is the patient's neurologist secondary to the tremor and frequent  falls.      Malik Bosie Helper, MD  Electronically Signed     HIE/MEDQ  D:  04/18/2007  T:  04/18/2007  Job:  [id number]

## 2010-06-28 NOTE — Discharge Summary (Signed)
[facility]  Patient:    Zuniga, Malik Visit Number: [id number] MRN: [id number]          Service Type: SUR Location: 3W 0373 01 Attending Physician:  Lindaann Slough Dictated by:   Lindaann Slough, M.D. Admit Date:  05/03/2001 Discharge Date: 05/06/2001   CC:         Reuben Likes, M.D.   Discharge Summary  DISCHARGE DIAGNOSES: 1. Adenocarcinoma of prostate. 2. Hypertension. 3. History of angina.  PROCEDURES:  Bilateral pelvic lymphadenectomy and radical retropubic prostatectomy on May 03, 2001.  HISTORY:  This is a 65 year old male who had an elevated PSA.  Prostate biopsy for adenocarcinoma Gleason 06.  Treatment options were discussed with the patient who chose a radical prostatectomy, and he was admitted on March 24 for the procedure.  PHYSICAL EXAMINATION:  VITAL SIGNS:  Blood pressure 108/66, pulse 90, respirations 16, temperature 96.9.  HEENT:  Head is normal.  Pupils equal and reactive to light and accommodation. He wears hearing aids.  NECK:  Supple.  No cervical lymph nodes and no thyromegaly.  CHEST: Symmetrical.  Lungs fully extended.  Clear to percussion and auscultation.  HEART:  Regular rhythm.  No murmur, no gallops.  ABDOMEN:  Soft, nontender, nondistended.  Liver, spleen, and kidneys not palpable.  No organomegaly.  Bowel sounds normal.  GENITALIA:  Penis is circumcised.  Meatus is normal.  Scrotum is unremarkable. Testicle and epididymes within normal limits.  EXTREMITIES:  Within normal limits.  RECTAL:  Sphincter tone is normal.  Prostate is enlarged 40 g, no nodules.  LABORATORY DATA:  Hemoglobin on admission was 14.8, hematocrit 42.9, WBC 7.4. Sodium 136, potassium 3.8, chloride 103, BUN 10, creatinine 1.0.  Urinalysis showed trace ketones and some hyaline casts.  Chest x-ray showed no evidence of active disease.  EKG showed normal sinus rhythm.  HOSPITAL COURSE:  The patient had bilateral  pelvic lymphadenectomy and radical retropubic prostatectomy on May 03, 2001.  Postop was uneventful.  He tolerated his diet well.  Foley catheter drained clear urine.  The Blake drainage gradually decreased.  He had a bowel movement on May 05, 2001.  The [place] drain was removed on May 06, 2001.  He was then discharged home.  DISCHARGE MEDICATIONS:   1. Levaquin 250 mg p.o. daily for 7 days to start on May 12, 2001.  2. Percocet 1 or 2 tablets p.o. q.4h. p.r.n. pain.  3. Klonopin 1 mg p.o. daily.  4. Diovan 80 mg p.o. daily.  5. Depakote ER 500 mg 2 tablets p.o. h.s.  6. Lipitor 10 mg p.o. daily.  7. Toprol XL 25 mg p.o. daily.  8. Allegra 180 mg p.r.n.  9. Paxil 40 mg p.o. daily h.s. 10. Lexapro 5 mg p.o. daily. 11. NitroQuick SL 0.4 mg p.r.n.  DIET:  Regular.  CONDITION UPON DISCHARGE:  Improved.  SPECIAL INSTRUCTIONS:  No lifting, no straining, no driving, no sexual activity.  FOLLOWUP:  In two weeks for removal of Foley catheter. Dictated by:   Lindaann Slough, M.D. Attending Physician:  Lindaann Slough DD:  05/06/01 TD:  05/07/01 Job: [id number]
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
## 2010-07-11 ENCOUNTER — Ambulatory Visit
Admission: RE | Admit: 2010-07-11 | Discharge: 2010-07-11 | Disposition: A | Discharge status: Home / Self Care | Payer: Medicare Other | Source: Ambulatory Visit | Attending: Neurosurgery | Admitting: Neurosurgery

## 2010-07-11 ENCOUNTER — Other Ambulatory Visit: Payer: Self-pay | Admitting: Neurosurgery

## 2010-07-11 DIAGNOSIS — M545 Low back pain, unspecified: Secondary | ICD-10-CM

## 2010-08-23 ENCOUNTER — Telehealth: Payer: Self-pay | Admitting: Family Medicine

## 2010-08-23 NOTE — Telephone Encounter (Signed)
Pt spouse notified that I will put three up front for pick up.

## 2010-08-26 ENCOUNTER — Other Ambulatory Visit: Payer: Self-pay | Admitting: Family Medicine

## 2010-08-26 MED ORDER — HYDROCOD POLST-CHLORPHEN POLST 10-8 MG/5ML PO LQCR: 5.0000 mL | Freq: Two times a day (BID) | ORAL | Status: DC | PRN

## 2010-08-26 NOTE — Telephone Encounter (Signed)
Please advise in MD absence.

## 2010-08-26 NOTE — Telephone Encounter (Signed)
Per Dr Laury Axon

## 2010-08-26 NOTE — Telephone Encounter (Signed)
Will fax hard copy

## 2010-08-26 NOTE — Telephone Encounter (Signed)
Refill x1 

## 2010-09-09 ENCOUNTER — Other Ambulatory Visit: Payer: Self-pay | Admitting: Family Medicine

## 2010-09-09 MED ORDER — ALBUTEROL SULFATE HFA 108 (90 BASE) MCG/ACT IN AERS: 2.0000 | INHALATION_SPRAY | Freq: Four times a day (QID) | RESPIRATORY_TRACT | Status: DC

## 2010-09-09 NOTE — Telephone Encounter (Signed)
Ok to refill x 6  

## 2010-09-11 ENCOUNTER — Telehealth: Payer: Self-pay | Admitting: Family Medicine

## 2010-09-11 NOTE — Telephone Encounter (Signed)
noted 

## 2010-09-11 NOTE — Telephone Encounter (Signed)
PER [facility], PATIENT WAS SCHEDULED WITH [facility] FOR 09-09-10, HE CALLED & CANCELLED & TOLD THEM HE DOES NOT WANT TO RSC'D HIS APPT.

## 2010-09-16 ENCOUNTER — Telehealth: Payer: Self-pay | Admitting: Pulmonary Disease

## 2010-09-16 ENCOUNTER — Telehealth: Payer: Self-pay | Admitting: Family Medicine

## 2010-09-16 NOTE — Telephone Encounter (Signed)
Let pt know that we do not get enough samples of these meds to keep him in supply.  I would probably stop spiriva and stay on symbicort for now to see if it will be adequate by itself.  If he is still smoking, remind him that he needs to quit, and the money can be used to buy his symbicort.

## 2010-09-16 NOTE — Telephone Encounter (Signed)
Spoke with patient and his Rx is $200 dollars and they are donut hole and wanted to get a sample until they can get the RX--Rx is on Med list 1 sample left at check in.,  KP

## 2010-09-16 NOTE — Telephone Encounter (Signed)
Pt would like to see if we can issue some Cymbalta samples.

## 2010-09-16 NOTE — Telephone Encounter (Signed)
Spoke with pt's spouse and notified of recs per [facility] and she verbalized understanding.

## 2010-09-16 NOTE — Telephone Encounter (Signed)
Spoke with pt's spouse. She states that pt is the donut hole and she is wanting samples of spiriva and symbicort. She states has tried to get pt assistance for both, but was denied. KC, pls advise, thanks!

## 2010-09-16 NOTE — Telephone Encounter (Signed)
Patient wife called requesting samples of symbicort inhaler

## 2010-09-27 ENCOUNTER — Other Ambulatory Visit: Payer: Self-pay | Admitting: Family Medicine

## 2010-09-27 MED ORDER — ONDANSETRON 8 MG PO TBDP: 8.0000 mg | ORAL_TABLET | Freq: Three times a day (TID) | ORAL | Status: AC | PRN

## 2010-09-27 NOTE — Telephone Encounter (Signed)
rx faxed in to pharmacy.   

## 2010-10-16 ENCOUNTER — Ambulatory Visit (INDEPENDENT_AMBULATORY_CARE_PROVIDER_SITE_OTHER): Payer: Medicare Other | Admitting: Family Medicine

## 2010-10-16 ENCOUNTER — Encounter: Payer: Self-pay | Admitting: Family Medicine

## 2010-10-16 DIAGNOSIS — J438 Other emphysema: Secondary | ICD-10-CM

## 2010-10-16 DIAGNOSIS — I10 Essential (primary) hypertension: Secondary | ICD-10-CM

## 2010-10-16 DIAGNOSIS — E785 Hyperlipidemia, unspecified: Secondary | ICD-10-CM

## 2010-10-16 DIAGNOSIS — R259 Unspecified abnormal involuntary movements: Secondary | ICD-10-CM

## 2010-10-16 LAB — LIPID PANEL
LDL Cholesterol: 109 mg/dL — ABNORMAL HIGH (ref 0–99)
Total CHOL/HDL Ratio: 3
VLDL: 21.2 mg/dL (ref 0.0–40.0)

## 2010-10-16 LAB — HEPATIC FUNCTION PANEL
ALT: 18 U/L (ref 0–53)
AST: 22 U/L (ref 0–37)
Alkaline Phosphatase: 50 U/L (ref 39–117)
Bilirubin, Direct: 0.1 mg/dL (ref 0.0–0.3)
Total Bilirubin: 0.4 mg/dL (ref 0.3–1.2)

## 2010-10-16 LAB — BASIC METABOLIC PANEL
Chloride: 100 mEq/L (ref 96–112)
GFR: 104.52 mL/min (ref >60.00)
Potassium: 4.6 mEq/L (ref 3.5–5.1)
Sodium: 138 mEq/L (ref 135–145)

## 2010-10-16 MED ORDER — LISINOPRIL 10 MG PO TABS: 10.0000 mg | ORAL_TABLET | Freq: Every day | ORAL | Status: DC

## 2010-10-16 NOTE — Progress Notes (Signed)
  Subjective:    Patient ID: Malik Zuniga, male    DOB: 10-08-1945, 65 y.o.   MRN: [id number]  HPI HTN- chronic problem, on Toprol.  BP slightly elevated today.  Wife reports 'rough 3 weeks'.  Has had 4 teeth pulled.  Denies CP, SOB above baseline, visual changes, edema.  Increased HAs due to tooth problems.  Hyperlipidemia- chronic problem.  LDL goal is < 70 due to CAD.  On Zocor.  Denies abdominal pain, N/V, myalgias.  Chronic prednisone- was started on this by Dr [name].  Sees Dr Shelle Iron (Pulmonary).  Was taking 10mg  BID.  Has been out of meds for 'a couple weeks'.  Has been on this for ~1 yr.  No side effects from stopping meds.  Was also on Doxy regularly.     Review of Systems For ROS see HPI Constitutional: He is oriented to person, place, and time.    Objective:   Physical Exam  Vitals reviewed. Constitutional: He appears well-developed and well-nourished.  HENT:  Head: Normocephalic and atraumatic.  Eyes: Conjunctivae and EOM are normal. Pupils are equal, round, and reactive to light.  Neck: Normal range of motion. Neck supple.  Cardiovascular: Normal rate, regular rhythm, normal heart sounds and intact distal pulses.   Pulmonary/Chest: Effort normal and breath sounds normal. No respiratory distress. He has no wheezes. He has no rales.  Abdominal: Soft. Bowel sounds are normal. He exhibits no distension. There is no tenderness. There is no rebound.  Musculoskeletal: He exhibits no edema.  Lymphadenopathy:    He has no cervical adenopathy.  Skin: Skin is warm and dry.  Psychiatric: He has a normal mood and affect. His behavior is normal.  Neurological: He is alert and oriented to person, place, and time.       Pt w/ low amplitude resting tremor that becomes high amplitude w/ intenetional movement.  Tremor only present in upper extremities, not lower extremities or head/neck/face         Assessment & Plan:

## 2010-10-16 NOTE — Patient Instructions (Signed)
Follow up in 1 month to recheck blood pressure Start the Lisinopril daily for blood pressure We'll notify you of your lab results and make any changes if needed STOP the Prednisone and Doxycycline- if you have lung or breathing issues, please call me or Dr Shelle Iron We will check into the Driving Eval and let you know Call with any questions or concerns Hang in there!

## 2010-10-17 ENCOUNTER — Telehealth: Payer: Self-pay

## 2010-10-17 NOTE — Telephone Encounter (Signed)
Message copied by Beverely Low on Thu Oct 17, 2010  3:19 PM ------      Message from: Sheliah Hatch      Created: Wed Oct 16, 2010  1:43 PM       Pt's labs look good but LDL needs to be lower since he has already had a heart attack.  Will need to increase Zocor to 40mg  nightly.

## 2010-10-17 NOTE — Telephone Encounter (Signed)
Pt's wife notified.

## 2010-10-24 ENCOUNTER — Encounter: Payer: Self-pay | Admitting: Family Medicine

## 2010-10-24 NOTE — Assessment & Plan Note (Signed)
Pt has been on prednisone 10mg  bid per previous PCP.  This was not recommended by pulm.  Pt has been off meds for 'weeks' w/out difficulty.  Considered restarting meds and then tapering down but pt does not appear to be having difficulty w/ HPA axis.  Reviewed red flags to watch for- pt and wife aware.  Based on this will stop both prednisone and doxy.  Pt and wife are in agreement.

## 2010-10-24 NOTE — Assessment & Plan Note (Signed)
Stable, tolerating statin w/out difficulty.  Check labs.  Adjust meds prn.

## 2010-10-24 NOTE — Assessment & Plan Note (Signed)
BP slightly elevated today.  Pt sees cards, has had MI but is not on ACE.  Will start today to control both BP and lower risk of repeat MI.  Reviewed possible side effects w/ pt and wife- they expressed understanding.  Will f/u in 1 month.

## 2010-10-24 NOTE — Assessment & Plan Note (Signed)
Pt is to have formal driving eval due to this- he and wife are upset about it.  Will follow.

## 2010-10-30 LAB — BASIC METABOLIC PANEL
BUN: 7
Calcium: 9
Chloride: 103
Creatinine, Ser: 1

## 2010-10-30 LAB — CARDIAC PANEL(CRET KIN+CKTOT+MB+TROPI)
CK, MB: 3.5
CK, MB: 5.4 — ABNORMAL HIGH
Relative Index: 2.3
Total CK: 188
Troponin I: 0.04

## 2010-10-30 LAB — DIFFERENTIAL
Basophils Relative: 1
Eosinophils Absolute: 0.1
Lymphs Abs: 2.5
Neutro Abs: 2.6
Neutrophils Relative %: 44

## 2010-10-30 LAB — BLOOD GAS, ARTERIAL
Drawn by: 283391
FIO2: 0.21
Patient temperature: 98.6
TCO2: 24
pCO2 arterial: 45.1 — ABNORMAL HIGH
pH, Arterial: 7.395

## 2010-10-30 LAB — URINALYSIS, ROUTINE W REFLEX MICROSCOPIC
Bilirubin Urine: NEGATIVE
Glucose, UA: NEGATIVE
Hgb urine dipstick: NEGATIVE
pH: 6.5

## 2010-10-30 LAB — CBC
MCV: 92.5
Platelets: 265
WBC: 6

## 2010-10-30 LAB — CK TOTAL AND CKMB (NOT AT ARMC): Total CK: 258 — ABNORMAL HIGH

## 2010-10-30 LAB — VALPROIC ACID LEVEL: Valproic Acid Lvl: 47.2 — ABNORMAL LOW

## 2010-10-31 ENCOUNTER — Encounter: Payer: Self-pay | Admitting: Family Medicine

## 2010-10-31 ENCOUNTER — Ambulatory Visit: Payer: Medicare Other | Admitting: Family Medicine

## 2010-10-31 ENCOUNTER — Ambulatory Visit (INDEPENDENT_AMBULATORY_CARE_PROVIDER_SITE_OTHER): Payer: Medicare Other | Admitting: Family Medicine

## 2010-10-31 DIAGNOSIS — T783XXA Angioneurotic edema, initial encounter: Secondary | ICD-10-CM

## 2010-10-31 DIAGNOSIS — J40 Bronchitis, not specified as acute or chronic: Secondary | ICD-10-CM

## 2010-10-31 HISTORY — DX: Angioneurotic edema, initial encounter: T78.3XXA

## 2010-10-31 LAB — COMPREHENSIVE METABOLIC PANEL
ALT: 15
CO2: 26
Calcium: 8.7
GFR calc non Af Amer: >60
Glucose, Bld: 103 — ABNORMAL HIGH
Sodium: 135

## 2010-10-31 LAB — CARDIAC PANEL(CRET KIN+CKTOT+MB+TROPI): CK, MB: 1.8

## 2010-10-31 LAB — CBC
HCT: 33.3 — ABNORMAL LOW
Hemoglobin: 11.6 — ABNORMAL LOW
MCHC: 34.9
MCV: 91.4
RBC: 3.64 — ABNORMAL LOW

## 2010-10-31 MED ORDER — AZITHROMYCIN 250 MG PO TABS: 250.0000 mg | ORAL_TABLET | Freq: Every day | ORAL | Status: DC

## 2010-10-31 MED ORDER — PREDNISONE 20 MG PO TABS: ORAL_TABLET | ORAL | Status: DC

## 2010-10-31 MED ORDER — HYDROCOD POLST-CHLORPHEN POLST 10-8 MG/5ML PO LQCR: 5.0000 mL | Freq: Two times a day (BID) | ORAL | Status: DC | PRN

## 2010-10-31 NOTE — Assessment & Plan Note (Signed)
Stop ACE.  Added to allergy list.  Start Prednisone for swelling.  Pt and wife aware.

## 2010-10-31 NOTE — Assessment & Plan Note (Signed)
Due to pt's hx of chronic bronchitis will start abx even though lungs are clear today.  Cough meds prn.  Reviewed supportive care and red flags that should prompt return.  Pt expressed understanding and is in agreement w/ plan.

## 2010-10-31 NOTE — Progress Notes (Signed)
  Subjective:    Patient ID: Malik Zuniga, male    DOB: 01/12/1946, 65 y.o.   MRN: [id number]  HPI ? Sinus infxn- has hx of COPD and PNA.  Pt reports 'really bad cold'.  + nasal congestion, chest congestion, subjective fevers, having facial swelling and lip numbness as of last night (recently started Lisinopril).  Denies tongue swelling or difficulty swallowing.  Went to dentist yesterday to get dental molds done.  + facial pressure.  Cough is productive.   Review of Systems For ROS see HPI     Objective:   Physical Exam  Vitals reviewed. Constitutional: He appears well-developed and well-nourished. No distress.  HENT:  Nose: Nose normal.  Mouth/Throat: Oropharynx is clear and moist. No oropharyngeal exudate.       TMs normal bilaterally L facial swelling + lip swelling No swelling of tongue or posterior pharynx No TTP over sinuses  Neck: Neck supple.  Cardiovascular: Normal rate and regular rhythm.   Pulmonary/Chest: Effort normal and breath sounds normal. No respiratory distress. He has no wheezes. He has no rales.       No cough heard  Lymphadenopathy:    He has no cervical adenopathy.          Assessment & Plan:

## 2010-10-31 NOTE — Patient Instructions (Signed)
STOP the Lisinopril Start the Prednisone- take w/ food to avoid upset stomach Take the Azithromycin for bronchitis Use Mucinex to thin your congestion Call with any questions or concerns If your symptoms worsen, please go to the ER Hang in there!

## 2010-11-04 LAB — BLOOD GAS, ARTERIAL
Bicarbonate: 28.6 — ABNORMAL HIGH
pCO2 arterial: 43.3
pH, Arterial: 7.436
pO2, Arterial: 81.9

## 2010-11-04 LAB — CULTURE, BLOOD (ROUTINE X 2): Culture: NO GROWTH

## 2010-11-04 LAB — URINALYSIS, ROUTINE W REFLEX MICROSCOPIC
Leukocytes, UA: NEGATIVE
Protein, ur: 30 — AB
Urobilinogen, UA: 1

## 2010-11-04 LAB — DIFFERENTIAL
Basophils Absolute: 0
Basophils Absolute: 0
Basophils Relative: 1
Eosinophils Absolute: 0
Lymphocytes Relative: 22
Monocytes Absolute: 0.5
Monocytes Absolute: 0.6
Monocytes Relative: 11
Neutro Abs: 3.6
Neutro Abs: 5.3
Neutrophils Relative %: 76

## 2010-11-04 LAB — TSH: TSH: 0.459

## 2010-11-04 LAB — COMPREHENSIVE METABOLIC PANEL
AST: 44 — ABNORMAL HIGH
Albumin: 3.3 — ABNORMAL LOW
Calcium: 8.9
Chloride: 101
Creatinine, Ser: 0.75
GFR calc Af Amer: >60
Total Bilirubin: 0.4

## 2010-11-04 LAB — POCT CARDIAC MARKERS
CKMB, poc: 18.7
Myoglobin, poc: >500
Troponin i, poc: <0.05

## 2010-11-04 LAB — CBC
Hemoglobin: 11.8 — ABNORMAL LOW
MCHC: 34
Platelets: 189
RBC: 3.7 — ABNORMAL LOW
RBC: 4.11 — ABNORMAL LOW
RDW: 14
WBC: 7

## 2010-11-04 LAB — PROTIME-INR: Prothrombin Time: 13.7

## 2010-11-04 LAB — CK TOTAL AND CKMB (NOT AT ARMC)
CK, MB: 42.1 — ABNORMAL HIGH
Total CK: 1026 — ABNORMAL HIGH

## 2010-11-04 LAB — URINE CULTURE: Culture: NO GROWTH

## 2010-11-04 LAB — URINE MICROSCOPIC-ADD ON

## 2010-11-06 LAB — DIFFERENTIAL
Eosinophils Absolute: 0.1
Lymphocytes Relative: 36
Lymphs Abs: 2.7
Monocytes Relative: 5
Neutro Abs: 4.4
Neutrophils Relative %: 58

## 2010-11-06 LAB — BASIC METABOLIC PANEL
Chloride: 99
Creatinine, Ser: 0.87
GFR calc Af Amer: >60
GFR calc non Af Amer: >60

## 2010-11-06 LAB — TYPE AND SCREEN: Antibody Screen: NEGATIVE

## 2010-11-06 LAB — CBC
HCT: 25.8 — ABNORMAL LOW
Hemoglobin: 8.8 — ABNORMAL LOW
MCHC: 34.1
MCV: 93.2
MCV: 93.5
RBC: 4.72
RBC: 2.76 — ABNORMAL LOW
WBC: 7.6
WBC: 8.5

## 2010-11-08 ENCOUNTER — Telehealth: Payer: Self-pay | Admitting: Family Medicine

## 2010-11-08 MED ORDER — AZITHROMYCIN 250 MG PO TABS: ORAL_TABLET | ORAL | Status: DC

## 2010-11-08 NOTE — Telephone Encounter (Signed)
Can have 2nd round of Zpack.  If no improvement after this will need appt

## 2010-11-08 NOTE — Telephone Encounter (Signed)
Discuss with patient  

## 2010-11-08 NOTE — Telephone Encounter (Signed)
Patient was seen last week  Was given zpac - he still has cough & chills - [facility]

## 2010-11-14 ENCOUNTER — Ambulatory Visit: Payer: Medicare Other | Admitting: Family Medicine

## 2010-11-18 ENCOUNTER — Ambulatory Visit (INDEPENDENT_AMBULATORY_CARE_PROVIDER_SITE_OTHER): Payer: Medicare Other | Admitting: Family Medicine

## 2010-11-18 ENCOUNTER — Encounter: Payer: Self-pay | Admitting: Family Medicine

## 2010-11-18 DIAGNOSIS — J329 Chronic sinusitis, unspecified: Secondary | ICD-10-CM

## 2010-11-18 MED ORDER — AMOXICILLIN-POT CLAVULANATE 875-125 MG PO TABS: 1.0000 | ORAL_TABLET | Freq: Two times a day (BID) | ORAL | Status: AC

## 2010-11-18 NOTE — Progress Notes (Signed)
  Subjective:    Patient ID: Malik Zuniga, male    DOB: [birth year], 65 y.o.   MRN: [id number]  HPI URI- pt's sxs started mid-September.  Took 2 rounds of Zpack.  Pt would feel good on meds but 'then it would come on strong' once meds were finished.  + HA, clammy chills, nasal congestion, chest congestion, ear pain, facial pain.  No fevers.  No sick contacts.  Cough is productive of clear sputum.   Review of Systems For ROS see HPI     Objective:   Physical Exam  Vitals reviewed. Constitutional: He appears well-developed and well-nourished. No distress.  HENT:  Head: Normocephalic and atraumatic.  Right Ear: Tympanic membrane normal.  Left Ear: Tympanic membrane normal.  Nose: Mucosal edema and rhinorrhea present. Right sinus exhibits maxillary sinus tenderness and frontal sinus tenderness. Left sinus exhibits maxillary sinus tenderness and frontal sinus tenderness.  Mouth/Throat: Mucous membranes are normal. Oropharyngeal exudate and posterior oropharyngeal erythema present. No posterior oropharyngeal edema.       + PND  Eyes: Conjunctivae and EOM are normal. Pupils are equal, round, and reactive to light.  Neck: Normal range of motion. Neck supple.  Cardiovascular: Normal rate, regular rhythm and normal heart sounds.   Pulmonary/Chest: Effort normal. No respiratory distress. He has wheezes. He has no rales.       + hacking cough  Lymphadenopathy:    He has no cervical adenopathy.  Skin: Skin is warm and dry.          Assessment & Plan:

## 2010-11-18 NOTE — Patient Instructions (Signed)
This is a sinus infection- take the Augmentin as directed, take w/ food to avoid upset stomach Make sure you use your inhalers Drink plenty of fluids REST! Hang in there!

## 2010-11-20 ENCOUNTER — Other Ambulatory Visit: Payer: Self-pay | Admitting: Family Medicine

## 2010-11-20 MED ORDER — BUDESONIDE-FORMOTEROL FUMARATE 160-4.5 MCG/ACT IN AERO: 2.0000 | INHALATION_SPRAY | Freq: Two times a day (BID) | RESPIRATORY_TRACT | Status: DC

## 2010-11-20 NOTE — Telephone Encounter (Signed)
Done

## 2010-11-21 LAB — BASIC METABOLIC PANEL
CO2: 30
Calcium: 8.7
Creatinine, Ser: 0.82
GFR calc Af Amer: >60
GFR calc non Af Amer: >60
Glucose, Bld: 102 — ABNORMAL HIGH
Sodium: 135

## 2010-11-21 LAB — CBC
HCT: 40.5
Hemoglobin: 13.6
Hemoglobin: 13.8
MCHC: 34.1
MCHC: 34.2
MCV: 93.5
RBC: 4.33
RDW: 13.4
RDW: 13.8

## 2010-11-21 LAB — COMPREHENSIVE METABOLIC PANEL
ALT: 18
Alkaline Phosphatase: 51
BUN: 12
CO2: 29
Calcium: 9.3
GFR calc non Af Amer: >60
Glucose, Bld: 81
Total Protein: 6.3

## 2010-11-21 LAB — TYPE AND SCREEN
ABO/RH(D): A POS
Antibody Screen: NEGATIVE

## 2010-11-22 LAB — CBC
Hemoglobin: 12.1 — ABNORMAL LOW
RBC: 3.7 — ABNORMAL LOW
WBC: 6.2

## 2010-11-22 LAB — COMPREHENSIVE METABOLIC PANEL
ALT: 20
AST: 20
Alkaline Phosphatase: 44
CO2: 31
Chloride: 101
GFR calc Af Amer: >60
GFR calc non Af Amer: >60
Glucose, Bld: 107 — ABNORMAL HIGH
Potassium: 4.1
Sodium: 135
Total Bilirubin: 0.7

## 2010-11-22 LAB — TSH: TSH: 2.226

## 2010-11-25 ENCOUNTER — Ambulatory Visit (INDEPENDENT_AMBULATORY_CARE_PROVIDER_SITE_OTHER)
Admission: RE | Admit: 2010-11-25 | Discharge: 2010-11-25 | Disposition: A | Discharge status: Home / Self Care | Payer: Medicare Other | Source: Ambulatory Visit | Attending: Family Medicine | Admitting: Family Medicine

## 2010-11-25 ENCOUNTER — Encounter: Payer: Self-pay | Admitting: Family Medicine

## 2010-11-25 ENCOUNTER — Telehealth: Payer: Self-pay

## 2010-11-25 ENCOUNTER — Telehealth: Payer: Self-pay | Admitting: *Deleted

## 2010-11-25 ENCOUNTER — Ambulatory Visit (INDEPENDENT_AMBULATORY_CARE_PROVIDER_SITE_OTHER): Payer: Medicare Other | Admitting: Family Medicine

## 2010-11-25 VITALS — BP 135/70 | HR 107 | Temp 97.9°F | Wt 175.8 lb

## 2010-11-25 DIAGNOSIS — J40 Bronchitis, not specified as acute or chronic: Secondary | ICD-10-CM

## 2010-11-25 DIAGNOSIS — R9389 Abnormal findings on diagnostic imaging of other specified body structures: Secondary | ICD-10-CM

## 2010-11-25 DIAGNOSIS — R05 Cough: Secondary | ICD-10-CM

## 2010-11-25 MED ORDER — PREDNISONE 20 MG PO TABS: ORAL_TABLET | ORAL | Status: DC

## 2010-11-25 MED ORDER — GUAIFENESIN-CODEINE 100-10 MG/5ML PO SYRP: 10.0000 mL | ORAL_SOLUTION | Freq: Four times a day (QID) | ORAL | Status: DC | PRN

## 2010-11-25 NOTE — Progress Notes (Signed)
  Subjective:    Patient ID: Malik Zuniga, male    DOB: 08-10-1945, 65 y.o.   MRN: [id number]  HPI Cough- was switched to Augmentin last week and he feels this helped the sinus infxn but the chest congestion is no better.  Still alternating sweats and chills.  Still having HAs.  Increased SOB.  Is using albuterol once daily rather than 3x as directed.  Using the Symbicort and spiriva   Review of Systems For ROS see HPI     Objective:   Physical Exam  Vitals reviewed. Constitutional: He appears well-developed.       Appears better than previous visit  HENT:  Head: Normocephalic and atraumatic.       No TTP over sinuses TMs normal bilaterally  Pulmonary/Chest: Effort normal. No respiratory distress. He has no wheezes.       Coarse BS throughout w/ rattling cough          Assessment & Plan:

## 2010-11-25 NOTE — Patient Instructions (Signed)
We will call you with your xray results- go to [phone] and have this done Use your albuterol inhaler at least 3 times daily Continue the symbicort and spiriva Use the cough syrup as needed Add Mucinex to break up your chest congestion We'll decide on the next step after the chest xray Hang in there!!!

## 2010-11-25 NOTE — Telephone Encounter (Signed)
Pt's wife aware of results and CT appointment that is scheduled for 11/26/10 at 3:15. Aware that pt is NPO for 2 hrs prior to visit

## 2010-11-25 NOTE — Telephone Encounter (Signed)
Pt came in for OV today.

## 2010-11-25 NOTE — Telephone Encounter (Signed)
Message copied by Beverely Low on Mon Nov 25, 2010  4:57 PM ------      Message from: Sheliah Hatch      Created: Mon Nov 25, 2010  4:30 PM       CXR doesn't show pneumonia- no need for additional antibiotics.  Will start prednisone 20mg - 2 tabs daily x10 days, take w/ food.  Will also need to set up CT scan to assess possible pulmonary nodule vs xray shadow given his hx of smoking.

## 2010-11-25 NOTE — Telephone Encounter (Signed)
Will need to return for OV to assess- pt has been on 2 different abx.

## 2010-11-25 NOTE — Telephone Encounter (Signed)
Pt wife left VM that Pt is due to finished Augmentin on wed but Pt still c/o cough, chills and headache. Pt wife notes that med did help to loosing up some of the nasal congestion but other symptoms still remain.Pt seen for second time on 11-18-10 .Please advise

## 2010-11-26 ENCOUNTER — Ambulatory Visit (INDEPENDENT_AMBULATORY_CARE_PROVIDER_SITE_OTHER)
Admission: RE | Admit: 2010-11-26 | Discharge: 2010-11-26 | Disposition: A | Discharge status: Home / Self Care | Payer: Medicare Other | Source: Ambulatory Visit | Attending: Family Medicine | Admitting: Family Medicine

## 2010-11-26 DIAGNOSIS — R9389 Abnormal findings on diagnostic imaging of other specified body structures: Secondary | ICD-10-CM

## 2010-11-26 DIAGNOSIS — R918 Other nonspecific abnormal finding of lung field: Secondary | ICD-10-CM

## 2010-11-26 DIAGNOSIS — J329 Chronic sinusitis, unspecified: Secondary | ICD-10-CM | POA: Insufficient documentation

## 2010-11-26 MED ORDER — IOHEXOL 300 MG/ML  SOLN
80.0000 mL | Freq: Once | INTRAMUSCULAR | Status: AC | PRN
  Administered 2010-11-26: 80 mL via INTRAVENOUS

## 2010-11-26 NOTE — Assessment & Plan Note (Signed)
Pt's sxs and PE consistent w/ sinus infxn.  Failed 2 rounds of Azithromycin.  Start Augmentin.  Encouraged regular use of inhalers given pt's wheezing.  Reviewed supportive care and red flags that should prompt return.  Pt expressed understanding and is in agreement w/ plan.

## 2010-11-26 NOTE — Assessment & Plan Note (Signed)
Pt's cough persists despite lack of bacterial infxn on exam.  Will get CXR to assess.  May be strictly due to his COPD and in that case abx would be ineffective and steroids would be more beneficial.  Stressed importance of inhalers.  Reviewed supportive care and red flags that should prompt return.  Pt expressed understanding and is in agreement w/ plan.

## 2010-11-27 ENCOUNTER — Telehealth: Payer: Self-pay

## 2010-11-27 NOTE — Telephone Encounter (Signed)
Message copied by Beverely Low on Wed Nov 27, 2010 11:06 AM ------      Message from: Sheliah Hatch      Created: Tue Nov 26, 2010  7:39 PM       There is a small abnormality in the RLL, they recommend f/u in 6 months to reassess for growth or change.

## 2010-11-27 NOTE — Telephone Encounter (Signed)
Pt aware and verbalized understanding.  

## 2010-11-29 ENCOUNTER — Telehealth: Payer: Self-pay

## 2010-11-29 NOTE — Telephone Encounter (Signed)
Wife walked in and stated that she is concerned because the patient is still sick, reviewed the CT results and sh voiced understanding. She was not sure what to do.Discussed with Dr.Tabori and since the patient is still having issues with cough and respiratory issues he should his Pulmonary doctor and see if there is anything they could do differently to help him get better, and wife voiced understanding and agreed to give Pulmonary a call      KP

## 2010-12-05 ENCOUNTER — Other Ambulatory Visit: Payer: Self-pay | Admitting: Family Medicine

## 2010-12-06 MED ORDER — GUAIFENESIN-CODEINE 100-10 MG/5ML PO SYRP: 10.0000 mL | ORAL_SOLUTION | Freq: Four times a day (QID) | ORAL | Status: DC | PRN

## 2010-12-06 NOTE — Telephone Encounter (Signed)
Divalproex Sodium 500 Mg Please advise never filled by you.   Last OV 11-25-10, last filled cough med. Spoke with Pt who is still c/o dry cough and nasal congestion. Per Dr Laury Axon ok to filled x1. Pt aware Rx sent.

## 2010-12-07 NOTE — Telephone Encounter (Signed)
Pt needs to get Depakote from psych

## 2010-12-11 ENCOUNTER — Ambulatory Visit: Payer: Medicare Other | Admitting: Family Medicine

## 2010-12-11 NOTE — Telephone Encounter (Signed)
Wanted to clarify if you wanted to fill the tussin? I will call about the depakote

## 2010-12-11 NOTE — Telephone Encounter (Signed)
Noted the cough medication had been sent to pharmacy. Called pt to notify the cough medication rx had been sent to pharmacy and advised the depakote medication needed to be filled by pys. Pt understood

## 2010-12-11 NOTE — Telephone Encounter (Signed)
Looks like Dr Laury Axon approved his cough med refill x1- notes say this was sent.  If it was, no additional refills.  If it wasn't, ok to send.

## 2010-12-12 ENCOUNTER — Other Ambulatory Visit: Payer: Self-pay | Admitting: Family Medicine

## 2010-12-13 MED ORDER — ONDANSETRON HCL 8 MG PO TABS: 8.0000 mg | ORAL_TABLET | Freq: Three times a day (TID) | ORAL | Status: DC | PRN

## 2010-12-13 NOTE — Telephone Encounter (Signed)
rx sent to pharmacy by e-script  

## 2010-12-18 ENCOUNTER — Encounter: Payer: Self-pay | Admitting: Family Medicine

## 2010-12-18 ENCOUNTER — Ambulatory Visit (INDEPENDENT_AMBULATORY_CARE_PROVIDER_SITE_OTHER): Payer: Medicare Other | Admitting: Family Medicine

## 2010-12-18 DIAGNOSIS — J302 Other seasonal allergic rhinitis: Secondary | ICD-10-CM | POA: Insufficient documentation

## 2010-12-18 DIAGNOSIS — J309 Allergic rhinitis, unspecified: Secondary | ICD-10-CM

## 2010-12-18 NOTE — Progress Notes (Signed)
  Subjective:    Patient ID: Malik Zuniga, male    DOB: [birth year], 65 y.o.   MRN: [id number]  HPI URI- sxs x2 months now.  Has completed multiple rounds of abx.  Still having 'slight cough', 'can't break this congestion in my chest', still have alternating sweats and chills, nasal congestion, HAs (frontal).  'i'm just really miserable'.  Has not seen pulmonary as recommended.  Using Flonase daily, not on OTC antihistamine.  Quit smoking yesterday.   Review of Systems For ROS see HPI     Objective:   Physical Exam  Vitals reviewed. Constitutional: He appears well-developed and well-nourished. No distress.  HENT:  Head: Normocephalic and atraumatic.       No TTP over sinuses + turbinate edema + PND TMs normal bilaterally  Eyes: Conjunctivae and EOM are normal. Pupils are equal, round, and reactive to light.  Neck: Normal range of motion. Neck supple.  Cardiovascular: Normal rate, regular rhythm and normal heart sounds.   Pulmonary/Chest: Effort normal. No respiratory distress. He has no wheezes.  Lymphadenopathy:    He has no cervical adenopathy.  Skin: Skin is warm and dry.          Assessment & Plan:

## 2010-12-18 NOTE — Patient Instructions (Signed)
Your cough and nasal congestion may all be due to seasonal allergies Continue the Flonase daily Start Zyrtec daily to decrease your nasal congestion and post nasal drip Continue the Mucinex Call with any questions or concerns HANG IN THERE!!!

## 2010-12-22 NOTE — Assessment & Plan Note (Addendum)
No current evidence of infxn.  Pt's sxs may be due to untreated allergies.  Continue Flonase daily, add OTC antihistamine.  Reviewed supportive care and red flags that should prompt return.  Pt expressed understanding and is in agreement w/ plan.

## 2010-12-27 ENCOUNTER — Telehealth: Payer: Self-pay | Admitting: Family Medicine

## 2010-12-27 NOTE — Telephone Encounter (Signed)
Patient wants refill cheratussin - [facility]

## 2010-12-27 NOTE — Telephone Encounter (Signed)
Dr. Rennis Golden patient. Please advise    KP

## 2010-12-30 MED ORDER — GUAIFENESIN-CODEINE 100-10 MG/5ML PO SYRP: 10.0000 mL | ORAL_SOLUTION | Freq: Four times a day (QID) | ORAL | Status: DC | PRN

## 2010-12-30 NOTE — Telephone Encounter (Signed)
Last OV 12-18-10 last refill 12-06-10

## 2010-12-30 NOTE — Telephone Encounter (Signed)
Manually faxed to pharmacy for 

## 2010-12-30 NOTE — Telephone Encounter (Signed)
Ok for refill of #282ml

## 2011-01-13 ENCOUNTER — Telehealth: Payer: Self-pay | Admitting: Family Medicine

## 2011-01-13 NOTE — Telephone Encounter (Signed)
Seeing the NP would be appropriate- they discuss all pt's w/ a pulmonologist.

## 2011-01-13 NOTE — Telephone Encounter (Signed)
Discuss with patient wife 

## 2011-01-14 ENCOUNTER — Ambulatory Visit (INDEPENDENT_AMBULATORY_CARE_PROVIDER_SITE_OTHER): Payer: Medicare Other | Admitting: Adult Health

## 2011-01-14 ENCOUNTER — Ambulatory Visit (INDEPENDENT_AMBULATORY_CARE_PROVIDER_SITE_OTHER)
Admission: RE | Admit: 2011-01-14 | Discharge: 2011-01-14 | Disposition: A | Discharge status: Home / Self Care | Payer: Medicare Other | Source: Ambulatory Visit | Attending: Adult Health | Admitting: Adult Health

## 2011-01-14 ENCOUNTER — Encounter: Payer: Self-pay | Admitting: Adult Health

## 2011-01-14 VITALS — BP 138/76 | HR 90 | Temp 96.7°F | Ht 70.0 in | Wt 176.8 lb

## 2011-01-14 DIAGNOSIS — J449 Chronic obstructive pulmonary disease, unspecified: Secondary | ICD-10-CM

## 2011-01-14 MED ORDER — PREDNISONE 10 MG PO TABS: ORAL_TABLET | ORAL | Status: AC

## 2011-01-14 MED ORDER — AMOXICILLIN-POT CLAVULANATE 875-125 MG PO TABS: 1.0000 | ORAL_TABLET | Freq: Two times a day (BID) | ORAL | Status: AC

## 2011-01-14 NOTE — Patient Instructions (Signed)
Augmentin 875mg  Twice daily  For 10 days  Mucinex DM Twice daily  As needed  Cough/congestion  Wear Oxygen 2 l/m at night and As needed  With activity .  Prednisone taper over next week.  Hold fish oil for now.  follow up Dr. Shelle Iron in 2-3 weeks and As needed   I will call with xray results.  Please contact office for sooner follow up if symptoms do not improve or worsen or seek emergency care

## 2011-01-14 NOTE — Progress Notes (Signed)
  Subjective:    Patient ID: Malik Zuniga, male    DOB: Dec 15, 1945, 65 y.o.   MRN: [id number]  HPI 65 yo male with known hx of COPD   02/20/10  >The pt is a 64y/o male who I have been asked to see for management of copd. He has known severe obstructive disease from spirometry on 04/2009, and unfortunately continues to smoke. He is being treated with spiriva, symbicort, and as needed nebulized bronchodilators. He has chronic cough with mucus that varies from clear to discolored, along with chronic chest congestion. The pt gets sob with any exertion, and his wife states that he "lives in a recliner". The pt has severe chronic back pain along with sigificant LE weakness, and this interferes with his ambulatory tolerance. He has rare sob at rest. He has had a recent stress test with Dr. Katrinka Blazing, and tells me it was ok. He has had a recent cxr in Oct which showed hyperinflation but no acute process. >rx O2 at night , cont on spiriva and symbicort   01/14/2011 Acute OV  Complains of cough, congestion , wheezing and shortness of breath for 4 days.  Quit smoking 1 month ago. OTC not helping.  Taking Symbicort and Spiriva daily .  Use O2 at night     Review of Systems Constitutional:   No  weight loss, night sweats,  Fevers, chills,  +fatigue, or  lassitude.  HEENT:   No headaches,  Difficulty swallowing,  Tooth/dental problems, or  Sore throat,                No sneezing, itching, ear ache,  +nasal congestion, post nasal drip,   CV:  No chest pain,  Orthopnea, PND, swelling in lower extremities, anasarca, dizziness, palpitations, syncope.   GI  No heartburn, indigestion, abdominal pain, nausea, vomiting, diarrhea, change in bowel habits, loss of appetite, bloody stools.   Resp:    No coughing up of blood.    No chest wall deformity  Skin: no rash or lesions.  GU: no dysuria, change in color of urine, no urgency or frequency.  No flank pain, no hematuria   MS:  No joint pain or swelling.  No  decreased range of motion.  No back pain.  Psych:  No change in mood or affect. No depression or anxiety.  No memory loss.         Objective:   Physical Exam GEN: A/Ox3; pleasant , NAD, dishelved in wheelchair   HEENT:  [place]/AT,  EACs-clear, TMs-wnl, NOSE-clear drainage , THROAT-clear, no lesions, no postnasal drip or exudate noted.   NECK:  Supple w/ fair ROM; no JVD; normal carotid impulses w/o bruits; no thyromegaly or nodules palpated; no lymphadenopathy.  RESP  Coarse BS with few exp wheezes no accessory muscle use, no dullness to percussion  CARD:  RRR, no m/r/g  , no peripheral edema, pulses intact, no cyanosis or clubbing.  GI:   Soft & nt; nml bowel sounds; no organomegaly or masses detected.  Musco: Warm bil, no deformities or joint swelling noted.   Neuro: alert, no focal deficits noted.    Skin: Warm, no lesions or rashes         Assessment & Plan:

## 2011-01-14 NOTE — Progress Notes (Signed)
Ov reviewed, and agree with plan as outlined.  

## 2011-01-14 NOTE — Assessment & Plan Note (Signed)
Flare with associated sinusitis   Plan:  Augmentin 875mg  Twice daily  For 10 days  Mucinex DM Twice daily  As needed  Cough/congestion  Wear Oxygen 2 l/m at night and As needed  With activity .  Prednisone taper over next week.  Hold fish oil for now.  follow up Dr. Shelle Iron in 2-3 weeks and As needed   I will call with xray results.  Please contact office for sooner follow up if symptoms do not improve or worsen or seek emergency care

## 2011-01-31 ENCOUNTER — Ambulatory Visit (INDEPENDENT_AMBULATORY_CARE_PROVIDER_SITE_OTHER): Payer: Medicare Other | Admitting: Pulmonary Disease

## 2011-01-31 ENCOUNTER — Encounter: Payer: Self-pay | Admitting: Pulmonary Disease

## 2011-01-31 VITALS — BP 118/72 | HR 84 | Temp 97.7°F | Ht 70.0 in | Wt 177.6 lb

## 2011-01-31 DIAGNOSIS — J4489 Other specified chronic obstructive pulmonary disease: Secondary | ICD-10-CM

## 2011-01-31 DIAGNOSIS — J449 Chronic obstructive pulmonary disease, unspecified: Secondary | ICD-10-CM

## 2011-01-31 DIAGNOSIS — Z23 Encounter for immunization: Secondary | ICD-10-CM

## 2011-01-31 NOTE — Progress Notes (Signed)
Addended by: Tommie Sams on: 01/31/2011 11:41 AM   Modules accepted: Orders

## 2011-01-31 NOTE — Assessment & Plan Note (Signed)
The patient has a history of severe airflow obstruction, most likely on the basis of emphysema.  He has now quit smoking for the last 6 weeks, and is staying on his bronchodilator regimen.  He feels that he is doing much better.  I also think that his severe debility and weakness contribute quite a bit to his dyspnea on exertion.  I have asked him to try and stay as active as possible and to work on strengthening.  He is on a very good medical regimen for COPD, and I have asked him to continue this.

## 2011-01-31 NOTE — Patient Instructions (Signed)
Stay off cigarettes. No change in your current breathing medications Will give you the flu shot today Try sinus rinses to help with congestion followup with me in 6mos.

## 2011-01-31 NOTE — Progress Notes (Signed)
  Subjective:    Patient ID: Malik Zuniga, male    DOB: Sep 12, 1945, 65 y.o.   MRN: [id number]  HPI The patient comes in today for followup of his known severe COPD.  He is maintaining on his bronchodilator regimen, and feels that he is improving after a recent acute exacerbation associated with asthmatic bronchitis.  He has been away from cigarettes for the last 6 weeks, and I suspect he will see a decline in his frequency of respiratory infections if he can continue.  Currently he denies a significant cough or chest congestion.  He is having some sinus congestion for which I think he would benefit from rinses.   Review of Systems  Constitutional: Negative for fever and unexpected weight change.  HENT: Positive for congestion, rhinorrhea, trouble swallowing and sinus pressure. Negative for ear pain, nosebleeds, sore throat, sneezing, dental problem and postnasal drip.   Eyes: Negative for redness and itching.  Respiratory: Positive for shortness of breath. Negative for cough, chest tightness and wheezing.   Cardiovascular: Negative for palpitations and leg swelling.  Gastrointestinal: Positive for nausea. Negative for vomiting.  Genitourinary: Negative for dysuria.  Musculoskeletal: Negative for joint swelling.  Skin: Positive for rash.  Neurological: Positive for headaches.  Hematological: Does not bruise/bleed easily.  Psychiatric/Behavioral: Negative for dysphoric mood. The patient is not nervous/anxious.        Objective:   Physical Exam Frail appearing male in no acute distress Nose without purulence or discharge noted Chest with decreased breath sounds throughout, but no wheezes or rhonchi Heart with distant heart sounds, but regular Extremities without edema, no cyanosis noted Alert and oriented, moves all 4 extremities.        Assessment & Plan:

## 2011-02-07 ENCOUNTER — Encounter (HOSPITAL_COMMUNITY): Payer: Self-pay | Admitting: Neurology

## 2011-02-07 ENCOUNTER — Ambulatory Visit (INDEPENDENT_AMBULATORY_CARE_PROVIDER_SITE_OTHER): Payer: Medicare Other | Admitting: Family Medicine

## 2011-02-07 ENCOUNTER — Emergency Department (HOSPITAL_COMMUNITY): Payer: Medicare Other

## 2011-02-07 ENCOUNTER — Encounter: Payer: Self-pay | Admitting: Family Medicine

## 2011-02-07 ENCOUNTER — Inpatient Hospital Stay (HOSPITAL_COMMUNITY)
Admission: EM | Admit: 2011-02-07 | Discharge: 2011-02-10 | DRG: 190 | Disposition: A | Discharge status: Home Health | Payer: Medicare Other | Attending: Internal Medicine | Admitting: Internal Medicine

## 2011-02-07 ENCOUNTER — Other Ambulatory Visit: Payer: Self-pay

## 2011-02-07 VITALS — BP 130/82 | HR 113 | Temp 99.1°F | Ht 70.0 in | Wt 150.4 lb

## 2011-02-07 DIAGNOSIS — Z888 Allergy status to other drugs, medicaments and biological substances status: Secondary | ICD-10-CM

## 2011-02-07 DIAGNOSIS — J441 Chronic obstructive pulmonary disease with (acute) exacerbation: Principal | ICD-10-CM | POA: Diagnosis present

## 2011-02-07 DIAGNOSIS — Z8546 Personal history of malignant neoplasm of prostate: Secondary | ICD-10-CM

## 2011-02-07 DIAGNOSIS — I10 Essential (primary) hypertension: Secondary | ICD-10-CM | POA: Diagnosis present

## 2011-02-07 DIAGNOSIS — J22 Unspecified acute lower respiratory infection: Secondary | ICD-10-CM | POA: Insufficient documentation

## 2011-02-07 DIAGNOSIS — Z9981 Dependence on supplemental oxygen: Secondary | ICD-10-CM

## 2011-02-07 DIAGNOSIS — I252 Old myocardial infarction: Secondary | ICD-10-CM

## 2011-02-07 DIAGNOSIS — J449 Chronic obstructive pulmonary disease, unspecified: Secondary | ICD-10-CM

## 2011-02-07 DIAGNOSIS — Z79899 Other long term (current) drug therapy: Secondary | ICD-10-CM

## 2011-02-07 DIAGNOSIS — Z8673 Personal history of transient ischemic attack (TIA), and cerebral infarction without residual deficits: Secondary | ICD-10-CM

## 2011-02-07 DIAGNOSIS — F17200 Nicotine dependence, unspecified, uncomplicated: Secondary | ICD-10-CM | POA: Diagnosis present

## 2011-02-07 DIAGNOSIS — E785 Hyperlipidemia, unspecified: Secondary | ICD-10-CM | POA: Diagnosis present

## 2011-02-07 DIAGNOSIS — R000 Tachycardia, unspecified: Secondary | ICD-10-CM | POA: Diagnosis present

## 2011-02-07 DIAGNOSIS — J988 Other specified respiratory disorders: Secondary | ICD-10-CM

## 2011-02-07 DIAGNOSIS — R5381 Other malaise: Secondary | ICD-10-CM | POA: Diagnosis present

## 2011-02-07 DIAGNOSIS — F319 Bipolar disorder, unspecified: Secondary | ICD-10-CM | POA: Diagnosis present

## 2011-02-07 DIAGNOSIS — J111 Influenza due to unidentified influenza virus with other respiratory manifestations: Secondary | ICD-10-CM | POA: Diagnosis present

## 2011-02-07 DIAGNOSIS — Z9861 Coronary angioplasty status: Secondary | ICD-10-CM

## 2011-02-07 DIAGNOSIS — J9620 Acute and chronic respiratory failure, unspecified whether with hypoxia or hypercapnia: Secondary | ICD-10-CM | POA: Diagnosis present

## 2011-02-07 DIAGNOSIS — Z7982 Long term (current) use of aspirin: Secondary | ICD-10-CM

## 2011-02-07 HISTORY — DX: Pneumonitis due to inhalation of food and vomit: J69.0

## 2011-02-07 HISTORY — DX: Dependence on supplemental oxygen: Z99.81

## 2011-02-07 HISTORY — DX: Myoneural disorder, unspecified: G70.9

## 2011-02-07 LAB — CBC
HCT: 31.8 % — ABNORMAL LOW (ref 39.0–52.0)
Hemoglobin: 12.4 g/dL — ABNORMAL LOW (ref 13.0–17.0)
MCH: 32.1 pg (ref 26.0–34.0)
MCH: 31.1 pg (ref 26.0–34.0)
MCHC: 34.4 g/dL (ref 30.0–36.0)
MCV: 92.4 fL (ref 78.0–100.0)
Platelets: 246 10*3/uL (ref 150–400)
RBC: 3.44 MIL/uL — ABNORMAL LOW (ref 4.22–5.81)

## 2011-02-07 LAB — POCT I-STAT, CHEM 8
BUN: 8 mg/dL (ref 6–23)
Calcium, Ion: 1.18 mmol/L (ref 1.12–1.32)
Chloride: 102 mEq/L (ref 96–112)
Creatinine, Ser: 0.8 mg/dL (ref 0.50–1.35)
Glucose, Bld: 101 mg/dL — ABNORMAL HIGH (ref 70–99)
HCT: 40 % (ref 39.0–52.0)
Hemoglobin: 13.6 g/dL (ref 13.0–17.0)
Potassium: 4 mEq/L (ref 3.5–5.1)
Sodium: 137 mEq/L (ref 135–145)
TCO2: 29 mmol/L (ref 0–100)

## 2011-02-07 LAB — BLOOD GAS, ARTERIAL
Drawn by: 23588
O2 Content: 4 L/min
Patient temperature: 98.6
TCO2: 25.8 mmol/L (ref 0–100)
pH, Arterial: 7.445 (ref 7.350–7.450)

## 2011-02-07 LAB — CREATININE, SERUM: GFR calc Af Amer: >90 mL/min (ref >90)

## 2011-02-07 LAB — DIFFERENTIAL
Basophils Relative: 0 % (ref 0–1)
Eosinophils Absolute: 0.1 10*3/uL (ref 0.0–0.7)
Eosinophils Relative: 1 % (ref 0–5)
Monocytes Relative: 23 % — ABNORMAL HIGH (ref 3–12)
Neutrophils Relative %: 54 % (ref 43–77)

## 2011-02-07 LAB — CARDIAC PANEL(CRET KIN+CKTOT+MB+TROPI): Relative Index: INVALID (ref 0.0–2.5)

## 2011-02-07 MED ORDER — ASPIRIN 81 MG PO TBEC: 81.0000 mg | DELAYED_RELEASE_TABLET | Freq: Every day | ORAL | Status: DC

## 2011-02-07 MED ORDER — LEVALBUTEROL HCL 0.63 MG/3ML IN NEBU
0.6300 mg | INHALATION_SOLUTION | Freq: Four times a day (QID) | RESPIRATORY_TRACT | Status: DC | PRN
  Filled 2011-02-07: qty 3

## 2011-02-07 MED ORDER — ACETAMINOPHEN 325 MG PO TABS: 650.0000 mg | ORAL_TABLET | Freq: Four times a day (QID) | ORAL | Status: DC | PRN

## 2011-02-07 MED ORDER — PRIMIDONE 250 MG PO TABS
250.0000 mg | ORAL_TABLET | Freq: Two times a day (BID) | ORAL | Status: DC
  Administered 2011-02-07 – 2011-02-10 (×6): 250 mg via ORAL
  Filled 2011-02-07 (×7): qty 1

## 2011-02-07 MED ORDER — METHYLPREDNISOLONE SODIUM SUCC 125 MG IJ SOLR
60.0000 mg | Freq: Four times a day (QID) | INTRAMUSCULAR | Status: DC
  Administered 2011-02-07: 22:00:00 via INTRAVENOUS
  Administered 2011-02-08 (×2): 60 mg via INTRAVENOUS
  Administered 2011-02-08: 23:00:00 via INTRAVENOUS
  Administered 2011-02-08 – 2011-02-09 (×3): 60 mg via INTRAVENOUS
  Administered 2011-02-09 – 2011-02-10 (×2): via INTRAVENOUS
  Administered 2011-02-10: 60 mg via INTRAVENOUS
  Administered 2011-02-10: via INTRAVENOUS
  Filled 2011-02-07 (×3): qty 0.96
  Filled 2011-02-07: qty 2
  Filled 2011-02-07 (×10): qty 0.96

## 2011-02-07 MED ORDER — IPRATROPIUM BROMIDE 0.02 % IN SOLN: 0.5000 mg | RESPIRATORY_TRACT | Status: DC | PRN

## 2011-02-07 MED ORDER — ASPIRIN EC 81 MG PO TBEC
81.0000 mg | DELAYED_RELEASE_TABLET | ORAL | Status: AC
  Administered 2011-02-07: 81 mg via ORAL
  Filled 2011-02-07: qty 1

## 2011-02-07 MED ORDER — DIVALPROEX SODIUM ER 500 MG PO TB24
1000.0000 mg | ORAL_TABLET | Freq: Every day | ORAL | Status: DC
  Administered 2011-02-07 – 2011-02-09 (×3): 1000 mg via ORAL
  Filled 2011-02-07 (×4): qty 2

## 2011-02-07 MED ORDER — TRAZODONE HCL 150 MG PO TABS
150.0000 mg | ORAL_TABLET | Freq: Every day | ORAL | Status: DC
  Administered 2011-02-07 – 2011-02-09 (×3): 150 mg via ORAL
  Filled 2011-02-07 (×4): qty 1

## 2011-02-07 MED ORDER — SODIUM CHLORIDE 0.9 % IV SOLN
Freq: Once | INTRAVENOUS | Status: AC
  Administered 2011-02-07: 16:00:00 via INTRAVENOUS

## 2011-02-07 MED ORDER — PAROXETINE HCL 30 MG PO TABS
60.0000 mg | ORAL_TABLET | Freq: Every day | ORAL | Status: DC
  Administered 2011-02-08 – 2011-02-10 (×3): 60 mg via ORAL
  Filled 2011-02-07 (×3): qty 2

## 2011-02-07 MED ORDER — CEFTRIAXONE SODIUM 1 G IJ SOLR
1.0000 g | INTRAMUSCULAR | Status: DC
  Administered 2011-02-07 – 2011-02-09 (×3): 1 g via INTRAVENOUS
  Filled 2011-02-07 (×4): qty 10

## 2011-02-07 MED ORDER — BUDESONIDE-FORMOTEROL FUMARATE 160-4.5 MCG/ACT IN AERO
2.0000 | INHALATION_SPRAY | Freq: Two times a day (BID) | RESPIRATORY_TRACT | Status: DC
  Administered 2011-02-07 – 2011-02-10 (×5): 2 via RESPIRATORY_TRACT
  Filled 2011-02-07: qty 6

## 2011-02-07 MED ORDER — ALBUTEROL SULFATE (5 MG/ML) 0.5% IN NEBU
5.0000 mg | INHALATION_SOLUTION | Freq: Once | RESPIRATORY_TRACT | Status: AC
  Administered 2011-02-07: 5 mg via RESPIRATORY_TRACT
  Filled 2011-02-07 (×2): qty 0.5

## 2011-02-07 MED ORDER — HEPARIN SODIUM (PORCINE) 5000 UNIT/ML IJ SOLN
5000.0000 [IU] | Freq: Three times a day (TID) | INTRAMUSCULAR | Status: DC
  Administered 2011-02-07 – 2011-02-10 (×8): 5000 [IU] via SUBCUTANEOUS
  Filled 2011-02-07 (×11): qty 1

## 2011-02-07 MED ORDER — SODIUM CHLORIDE 0.9 % IV BOLUS (SEPSIS)
250.0000 mL | Freq: Once | INTRAVENOUS | Status: AC
  Administered 2011-02-07: 250 mL via INTRAVENOUS

## 2011-02-07 MED ORDER — LEVALBUTEROL HCL 0.63 MG/3ML IN NEBU
0.6300 mg | INHALATION_SOLUTION | Freq: Every day | RESPIRATORY_TRACT | Status: DC
  Administered 2011-02-07: 0.63 mg via RESPIRATORY_TRACT
  Filled 2011-02-07 (×3): qty 3

## 2011-02-07 MED ORDER — METOPROLOL SUCCINATE ER 100 MG PO TB24
100.0000 mg | ORAL_TABLET | Freq: Every day | ORAL | Status: DC
  Administered 2011-02-08 – 2011-02-10 (×3): 100 mg via ORAL
  Filled 2011-02-07 (×3): qty 1

## 2011-02-07 MED ORDER — DEXTROSE 5 % IV SOLN
500.0000 mg | INTRAVENOUS | Status: DC
  Administered 2011-02-07 – 2011-02-09 (×3): 500 mg via INTRAVENOUS
  Filled 2011-02-07 (×4): qty 500

## 2011-02-07 NOTE — ED Notes (Signed)
4501-01 READY 

## 2011-02-07 NOTE — Progress Notes (Signed)
  Subjective:    Patient ID: Malik Zuniga, male    DOB: Oct 19, 1945, 65 y.o.   MRN: [id number]  HPI URI- sxs started last week.  Much worse this week.  Cough is productive.  + fever, chills, fatigue.  + nasal congestion.  + HA.  Wife feels he may have been taking too much pain meds since he's been sick.  Pressure sore- located on buttock.   Review of Systems For ROS see HPI     Objective:   Physical Exam  Vitals reviewed. Constitutional: He appears well-developed and well-nourished. He appears distressed (obviously ill).  HENT:  Head: Normocephalic and atraumatic.  Nose: Nose normal.  Mouth/Throat: Oropharynx is clear and moist. No oropharyngeal exudate.       TMs normal  Eyes: Conjunctivae and EOM are normal. Pupils are equal, round, and reactive to light.  Neck: Neck supple.  Cardiovascular:       Tachy but regular  Pulmonary/Chest: He is in respiratory distress (mild distress). He has no wheezes. He has rales (coarse BS diffusely).       Increased rate  Neurological:       Much more lethargic than usual  Skin: Skin is warm and dry.          Assessment & Plan:

## 2011-02-07 NOTE — ED Notes (Signed)
Pt on monitor. EDP notified. 93% 3L

## 2011-02-07 NOTE — Assessment & Plan Note (Signed)
Pt in respiratory distress.  Only 92% on 4 L, does not usually require O2 during the day.  Wife concerned that he has also overdosed on narcotics while not feeling well.  Based on respiratory distress will send pt to hospital via EMS.  Pt and wife are in agreement.

## 2011-02-07 NOTE — ED Notes (Signed)
Called floor to give report and spoke with Dorina Hoyer, [employer] fund[employer]. Pt care endorsed to floor.

## 2011-02-07 NOTE — ED Provider Notes (Addendum)
4:00 PM  Date: 02/07/2011  Rate: 108  Rhythm: sinus tachycardia  QRS Axis: normal  Intervals: normal PQRS"  Poor R wave progression in precordial leads suggests possible old anterior myocardial infarction.    ST/T Wave abnormalities: normal  Conduction Disutrbances:none  Narrative Interpretation: Abnormal EKG.  Old EKG Reviewed: changes noted--today has QS in lead V2 that is new since 06/22/2007.        Carleene Cooper III, MD 02/07/11 1604  Carleene Cooper III, MD 02/07/11 [phone]

## 2011-02-07 NOTE — ED Notes (Signed)
Pt report received and care assumed from [place], [place].

## 2011-02-07 NOTE — ED Notes (Signed)
Pt not transferred to yellow due to receipt of floor bed.

## 2011-02-07 NOTE — ED Provider Notes (Signed)
History     CSN: [id number]  Arrival date & time 02/07/11  1411   First MD Initiated Contact with Patient 02/07/11 1418      Chief Complaint  Patient presents with  . Influenza    (Consider location/radiation/quality/duration/timing/severity/associated sxs/prior treatment) Patient is a 65 y.o. male presenting with flu symptoms. The history is provided by the patient.  Influenza This is a recurrent (Per his wife, he has had recurrent upper respiratory infections over the last 2 months that get better with treatment but recur shortly after.) problem. The current episode started in the past 7 days (He had an appointment with Dr. Shelle Iron last week and had no symptoms. New cough, fever and, now, confusion started over last 2-3 days.). The problem occurs constantly. The problem has been gradually worsening. Associated symptoms include congestion, coughing, fatigue and a fever. Pertinent negatives include no chest pain, chills, nausea, sore throat or vomiting.    Past Medical History  Diagnosis Date  . Myocardial infarction   . Stroke   . Hyperlipidemia   . Hypertension   . Depression   . Emphysema of lung   . Tremor   . Headache, chronic daily   . History of prostate cancer     Past Surgical History  Procedure Date  . Back surgery     x5, 2006-2011 all by Dr. Jordan Likes  . Cholecystectomy 1996  . 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 Jordan Likes  . Vascular stent 2000    Dr. Alanda Amass  . Prostate surgery 2004    removal of prostate cancer, Dr. Brunilda Payor  . Hand surgery 1989    crushed thumb, Dr. Merlyn Lot  . Ulnar nerve repair 1998    left arm, Dr. Merlyn Lot  . Gastrostomy w/ feeding tube 2008    Dr. Ewing Schlein    Family History  Problem Relation Age of Onset  . Heart disease Mother   . Cancer Father     brain cancer and prostate cancer     History  Substance Use Topics  . Smoking status: Former Smoker -- 1.0 packs/day for 50 years    Types: Cigarettes    Quit date: 12/15/2010  .  Smokeless tobacco: Never Used   Comment: less than 1/2 ppd  . Alcohol Use: No      Review of Systems  Constitutional: Positive for fever and fatigue. Negative for chills.  HENT: Positive for congestion and rhinorrhea. Negative for sore throat.   Respiratory: Positive for cough. Negative for shortness of breath.   Cardiovascular: Negative for chest pain.       Chest pain with cough.  Gastrointestinal: Negative.  Negative for nausea and vomiting.  Musculoskeletal: Negative.   Skin: Negative.   Neurological:       Confusion.    Allergies  Lisinopril  Home Medications   Current Outpatient Rx  Name Route Sig Dispense Refill  . ALBUTEROL SULFATE HFA 108 (90 BASE) MCG/ACT IN AERS Inhalation Inhale 2 puffs into the lungs 4 (four) times daily. Prn 18 g 6  . ARIPIPRAZOLE 10 MG PO TABS Oral Take 10 mg by mouth. Take 1/2-1 tablet daily     . VITAMIN C 500 MG PO TABS Oral Take 500 mg by mouth daily.      . ASPIRIN 81 MG PO TBEC Oral Take 81 mg by mouth daily.      . AZITHROMYCIN 250 MG PO TABS  As directed 6 each 0  . BUDESONIDE-FORMOTEROL FUMARATE 160-4.5 MCG/ACT IN AERO Inhalation  Inhale 2 puffs into the lungs 2 (two) times daily. 1 Inhaler 3  . CHOLECALCIFEROL 1000 UNITS PO CAPS Oral Take 1,000 Units by mouth daily. ON HOLD    . DM-GUAIFENESIN ER 30-600 MG PO TB12 Oral Take 1 tablet by mouth every 12 (twelve) hours.      Marland Kitchen DIAZEPAM 5 MG PO TABS Oral Take 5 mg by mouth every 6 (six) hours as needed. Take 1-2 tablets by mouth     . DIVALPROEX SODIUM ER 500 MG PO TB24 Oral Take 500 mg by mouth. Take 2 tablets at bedtime     . DULOXETINE HCL 30 MG PO CPEP Oral Take 60 mg by mouth daily.     Marland Kitchen FLUTICASONE PROPIONATE 50 MCG/ACT NA SUSP Nasal 2 sprays by Nasal route daily.      Marland Kitchen HYDROCODONE-ACETAMINOPHEN 5-500 MG PO TABS Oral Take 1 tablet by mouth 4 (four) times daily as needed for pain. Take one tablet 4 times daily as needed 120 tablet 0  . IPRATROPIUM-ALBUTEROL 0.5-2.5 (3) MG/3ML IN SOLN  Nebulization Take 3 mLs by nebulization. Use once via nebulizer 4 times a day     . LACTULOSE 10 GM/15ML PO SOLN Oral Take by mouth 3 (three) times daily.      Marland Kitchen METOPROLOL SUCCINATE ER 100 MG PO TB24 Oral Take 100 mg by mouth daily.      Marland Kitchen MICONAZOLE NITRATE 2 % EX CREA Topical Apply topically 2 (two) times daily. Apply to affected area two times a day as needed for rash     . FISH OIL 1000 MG PO CAPS Oral Take by mouth. Take one tablet daily. ON HOLD    . OMEPRAZOLE 40 MG PO CPDR Oral Take 40 mg by mouth daily. Take two times a day     . ONDANSETRON HCL 8 MG PO TABS Oral Take 1 tablet (8 mg total) by mouth every 8 (eight) hours as needed. 20 tablet 0  . OXYCODONE-ACETAMINOPHEN 5-325 MG PO TABS Oral Take 1 tablet by mouth every 6 (six) hours as needed.      Marland Kitchen PAROXETINE HCL 20 MG PO TABS Oral Take 20 mg by mouth. Take 3 tablets daily    . PHENYLEPH-CPM-DM-APAP 06-11-08-325 MG PO CAPS Oral Take by mouth as needed.      Marland Kitchen PRIMIDONE 250 MG PO TABS Oral Take 250 mg by mouth. Take one tablet two times a day     . SIMVASTATIN 20 MG PO TABS Oral Take 20 mg by mouth at bedtime.      . SOLIFENACIN SUCCINATE 5 MG PO TABS Oral Take 10 mg by mouth daily.      Marland Kitchen TIOTROPIUM BROMIDE MONOHYDRATE 18 MCG IN CAPS Inhalation Place 18 mcg into inhaler and inhale daily.      . TRAZODONE HCL 100 MG PO TABS Oral Take 150 mg by mouth at bedtime.     Marland Kitchen ZINC 50 MG PO TABS Oral Take by mouth 1 dose over 46 hours.        BP 147/76  Pulse 111  Temp(Src) 97.8 F (36.6 C) (Oral)  Resp 28  SpO2 95%  Physical Exam  Constitutional: He is oriented to person, place, and time. He appears well-developed and well-nourished.       Cachectic appearing male.  HENT:  Head: Normocephalic and atraumatic.  Mouth/Throat: Mucous membranes are dry.  Neck: Normal range of motion. Neck supple.  Cardiovascular: Regular rhythm.  Tachycardia present.   No murmur heard. Pulmonary/Chest: Accessory  muscle usage present. He has no rales.        Prolonged expirations with diffuse rales.  Abdominal: Soft. Bowel sounds are normal. There is no tenderness. There is no rebound and no guarding.  Musculoskeletal: Normal range of motion. He exhibits no edema.  Neurological: He is alert and oriented to person, place, and time. No cranial nerve deficit.  Skin: Skin is warm and dry. No rash noted.  Psychiatric: He has a normal mood and affect.    ED Course  Procedures (including critical care time)   Labs Reviewed  CBC  DIFFERENTIAL  I-STAT, CHEM 8   No results found.   No diagnosis found.    MDM  Patient has been hypoxic here, confused per family assessment and weaker, unable to hold his own weight as per his usual. Admit.        Rodena Medin, PA 02/07/11 [phone]

## 2011-02-07 NOTE — ED Notes (Signed)
Pt prepared for transport to [facility] holding.

## 2011-02-07 NOTE — Patient Instructions (Signed)
We are sending you directly to the ER Hang in there!!

## 2011-02-07 NOTE — H&P (Signed)
PCP:   Neena Rhymes, MD, MD   Chief Complaint:  Shortness of breath and coughing for 3 weeks  HPI: This is a 65 year old male with history of sever COPD, with ongoing tobacco abuse although patient claims last the smoking 4 weeks ago, he also has a history of aspiration, the patient presented with worsening shortness of breath and chest congestion, coughing with weak cough reflex, bedridden wife he had recurrent of her respiratory tract infection over the last 2 months DrClance. get better with treatment but to recuer shortly after, for the last 3 weeks he continued to complain of cough associated with mild grade fever no chills and then the condition of worsening by confusion, patient admitted also he have chest pain, when he coughs but not currently he denies any nausea vomiting or sore throat, he denies any change in bowel habit  Review of Systems: Has progressively declined, with limited home activity, he is on chronic home oxygen, he denies any hemoptysis or hematemesis and denies any focal weakness or sheet seizure, he denies any burning micturition, denies lower extremity swelling denies any paroxysmal nocturnal dyspnea, he complained of  shortness of breath TPast Medical History: Past Medical History  Diagnosis Date  . Myocardial infarction   . Stroke   . Hyperlipidemia   . Hypertension   . Depression   . Emphysema of lung   . Tremor   . Headache, chronic daily   . History of prostate cancer    COPD on home oxygen Spinal stenosis facets are for surgery Past Surgical History  Procedure Date  . Back surgery     x5, 2006-2011 all by Dr. Jordan Likes  . Cholecystectomy 1996  . 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 Jordan Likes  . Vascular stent 2000    Dr. Alanda Amass  . Prostate surgery 2004    removal of prostate cancer, Dr. Brunilda Payor  . Hand surgery 1989    crushed thumb, Dr. Merlyn Lot  . Ulnar nerve repair 1998    left arm, Dr. Merlyn Lot  . Gastrostomy w/ feeding tube 2008    Dr. Ewing Schlein     Medications: Prior to Admission medications   Medication Sig Start Date End Date Taking? Authorizing Provider  albuterol (VENTOLIN HFA) 108 (90 BASE) MCG/ACT inhaler Inhale 2 puffs into the lungs 4 (four) times daily. Prn 09/09/10  Yes Neena Rhymes, MD  Ascorbic Acid (VITAMIN C) 500 MG tablet Take 500 mg by mouth daily.     Yes Historical Provider, MD  aspirin 81 MG EC tablet Take 81 mg by mouth daily.     Yes Historical Provider, MD  budesonide-formoterol (SYMBICORT) 160-4.5 MCG/ACT inhaler Inhale 2 puffs into the lungs 2 (two) times daily. 11/20/10  Yes Neena Rhymes, MD  Cholecalciferol (VITAMIN D3) 1000 UNITS capsule Take 1,000 Units by mouth daily. ON HOLD   Yes Historical Provider, MD  dextromethorphan-guaiFENesin (MUCINEX DM) 30-600 MG per 12 hr tablet Take 1 tablet by mouth every 12 (twelve) hours.     Yes Historical Provider, MD  diazepam (VALIUM) 5 MG tablet Take 5 mg by mouth every 6 (six) hours as needed. For anxiety    Yes Historical Provider, MD  divalproex (DEPAKOTE) 500 MG 24 hr tablet Take 1,000 mg by mouth at bedtime.    Yes Historical Provider, MD  DULoxetine (CYMBALTA) 30 MG capsule Take 60 mg by mouth daily.    Yes Historical Provider, MD  fluticasone (FLONASE) 50 MCG/ACT nasal spray 2 sprays by Nasal route daily.  Yes Historical Provider, MD  HYDROcodone-acetaminophen (VICODIN) 5-500 MG per tablet Take 1 tablet by mouth 4 (four) times daily as needed for pain. Take one tablet 4 times daily as needed 06/18/10  Yes Neena Rhymes, MD  ipratropium-albuterol (DUONEB) 0.5-2.5 (3) MG/3ML SOLN Take 3 mLs by nebulization. Use once via nebulizer 4 times a day    Yes Historical Provider, MD  metoprolol (TOPROL-XL) 100 MG 24 hr tablet Take 100 mg by mouth daily.     Yes Historical Provider, MD  Omega-3 Fatty Acids (FISH OIL) 1000 MG CAPS Take by mouth. Take one tablet daily. ON HOLD   Yes Historical Provider, MD  omeprazole (PRILOSEC) 40 MG capsule Take 40 mg by mouth  daily. Take two times a day    Yes Historical Provider, MD  ondansetron (ZOFRAN) 8 MG tablet Take 8 mg by mouth every 8 (eight) hours as needed. For nausea  12/13/10  Yes Neena Rhymes, MD  oxyCODONE-acetaminophen (PERCOCET) 5-325 MG per tablet Take 1 tablet by mouth every 6 (six) hours as needed.     Yes Historical Provider, MD  PARoxetine (PAXIL) 20 MG tablet Take 60 mg by mouth daily.    Yes Historical Provider, MD  Phenyleph-CPM-DM-APAP (ALKA-SELTZER PLUS COLD & COUGH) 06-11-08-325 MG CAPS Take 2 tablets by mouth daily.    Yes Historical Provider, MD  primidone (MYSOLINE) 250 MG tablet Take 250 mg by mouth 2 (two) times daily.    Yes Historical Provider, MD  simvastatin (ZOCOR) 20 MG tablet Take 30 mg by mouth at bedtime.    Yes Historical Provider, MD  tiotropium (SPIRIVA HANDIHALER) 18 MCG inhalation capsule Place 18 mcg into inhaler and inhale daily.     Yes Historical Provider, MD  traZODone (DESYREL) 100 MG tablet Take 150 mg by mouth at bedtime.    Yes Historical Provider, MD  Zinc 50 MG TABS Take by mouth 1 dose over 46 hours.     Yes Historical Provider, MD    Allergies:   Allergies  Allergen Reactions  . Lisinopril Swelling    ANGIOEDEMA    Social History:  reports that he quit smoking about 7 weeks ago. His smoking use included Cigarettes. He has a 50 pack-year smoking history. He has never used smokeless tobacco. He reports that he does not drink alcohol or use illicit drugs. The patient has limited daily activity, he lives with his wife has only one grown son  Family History: Family History  Problem Relation Age of Onset  . Heart disease Mother   . Cancer Father     brain cancer and prostate cancer     Physical Exam: Filed Vitals:   02/07/11 1530 02/07/11 1630 02/07/11 1655 02/07/11 1745  BP: 157/92 152/120 145/83 147/87  Pulse: 111 117 116 120  Temp:    100.7 F (38.2 C)  TempSrc:    Oral  Resp: 28 24 18 22   SpO2: 95% 97% 94% 96%   He looks on moderate  respiratory distress, with chest congestion mainly at the upper airway, he have conjunctivitis of his left eye, his pupil equally reactive to light and accommodation, neck supple with no lymph node Lung diminished air entry bilateral, currently no active wheezing but he has chest congestion apparently from  dysphagia my presumptive diagnosis Abdomen soft nontender bowel sounds present extremities without edema and peripheral pulses intact, Sanders he is awake but sleepy, he was able to tell few history, he moves all his extremities  Labs on Admission:   [facility]  02/07/11 1513  NA 137  K 4.0  CL 102  CO2 --  GLUCOSE 101*  BUN 8  CREATININE 0.80  CALCIUM --  MG --  PHOS --   No results found for this basename: AST:2,ALT:2,ALKPHOS:2,BILITOT:2,PROT:2,ALBUMIN:2 in the last 72 hours No results found for this basename: LIPASE:2,AMYLASE:2 in the last 72 hours  Basename 02/07/11 1513 02/07/11 1500  WBC -- 5.5  NEUTROABS -- 2.9  HGB 13.6 12.4*  HCT 40.0 36.0*  MCV -- 93.3  PLT -- 253   No results found for this basename: CKTOTAL:3,CKMB:3,CKMBINDEX:3,TROPONINI:3 in the last 72 hours No results found for this basename: TSH,T4TOTAL,FREET3,T3FREE,THYROIDAB in the last 72 hours No results found for this basename: VITAMINB12:2,FOLATE:2,FERRITIN:2,TIBC:2,IRON:2,RETICCTPCT:2 in the last 72 hours  Radiological Exams on Admission: Dg Chest 2 View  01/14/2011  *RADIOLOGY REPORT*  Clinical Data: Persistent cough.  COPD exacerbation.  CHEST - 2 VIEW  Comparison: 11/26/2010  Findings: The heart size appears normal.  No pleural effusion or pulmonary edema.  Plate-like atelectasis versus scarring is noted within the left midlung.  No airspace consolidation noted.  Review of the visualized osseous structures is unremarkable.  IMPRESSION:  1.  Left midlung scar versus plate-like atelectasis. 2.  No evidence for pneumonia.  Original Report Authenticated By: Rosealee Albee, M.D.   Dg Chest Portable 1  View  02/07/2011  *RADIOLOGY REPORT*  Clinical Data: Cough, fever  PORTABLE CHEST - 1 VIEW  Comparison: 01/14/2011  Findings: Cardiomediastinal silhouette is stable.  No acute infiltrate or pulmonary edema.  Stable linear scarring or atelectasis in the lingula and left base.  IMPRESSION:  No acute infiltrate or pulmonary edema.  Stable linear scarring or atelectasis in the lingula and left base.  Original Report Authenticated By: Natasha Mead, M.D.    Assessment/Plan 1-acute on chronic respiratory distress likely secondary to sever COPD, and possible aspiration or pneumonia This patient may require to be transferred to step down as he has significant shortness of breath, I would proceed with ABG, keep the patient n.p.o., nebs treatment every 4 hours and every 6 hours mainly Xopenex secondary to tachycardia, Atrovent will be added, Solu-Medrol IV, would continue with Zithromax and Rocephin  injection, not as there is no CO2 ON blood drawn at this time, would provide flutter VALVE, flu panel. 2- febrile illness: Blood culture and continue current antibiotic 3-history of coronary artery disease chest pain no pleuritic I would proceed with cycle patient enzyme 4-hypertension we'll continue with home medication mainly metoprolol which could help with the patient's current tachycardia 5-tachycardia likely secondary to respiratory distress 7 deconditioning PT and OT  Diahann Guajardo I. [phone] 02/07/2011, 7:30 PM

## 2011-02-07 NOTE — ED Notes (Signed)
Pt report called and care endorsed to Tiajuana Amass., RN

## 2011-02-07 NOTE — ED Notes (Signed)
PER EMS- Pt comes [place] at [facility] where he was being seen for cough, SOB. Pt reportedly dx with URI within last few weeks. Pt reporting worsening symptoms. At office pt low oxygen saturation, placed on 4  L and 92%. Pt also requesting to have sacral pressure ulcer evaluated.

## 2011-02-08 LAB — CARDIAC PANEL(CRET KIN+CKTOT+MB+TROPI)
CK, MB: 1.4 ng/mL (ref 0.3–4.0)
Relative Index: INVALID (ref 0.0–2.5)
Relative Index: INVALID (ref 0.0–2.5)
Total CK: 43 U/L (ref 7–232)
Total CK: 42 U/L (ref 7–232)
Total CK: 47 U/L (ref 7–232)
Total CK: 64 U/L (ref 7–232)
Troponin I: <0.3 ng/mL (ref <0.30)
Troponin I: <0.3 ng/mL (ref <0.30)

## 2011-02-08 LAB — COMPREHENSIVE METABOLIC PANEL
ALT: 45 U/L (ref 0–53)
AST: 17 U/L (ref 0–37)
Alkaline Phosphatase: 73 U/L (ref 39–117)
CO2: 24 mEq/L (ref 19–32)
Calcium: 9.4 mg/dL (ref 8.4–10.5)
Chloride: 98 mEq/L (ref 96–112)
GFR calc Af Amer: >90 mL/min (ref >90)
GFR calc non Af Amer: >90 mL/min (ref >90)
Glucose, Bld: 164 mg/dL — ABNORMAL HIGH (ref 70–99)
Sodium: 134 mEq/L — ABNORMAL LOW (ref 135–145)
Total Bilirubin: 0.2 mg/dL — ABNORMAL LOW (ref 0.3–1.2)

## 2011-02-08 LAB — CBC
Hemoglobin: 11 g/dL — ABNORMAL LOW (ref 13.0–17.0)
MCH: 31.7 pg (ref 26.0–34.0)
MCHC: 34.8 g/dL (ref 30.0–36.0)
Platelets: 244 10*3/uL (ref 150–400)
RDW: 13.8 % (ref 11.5–15.5)

## 2011-02-08 MED ORDER — LEVALBUTEROL HCL 0.63 MG/3ML IN NEBU
0.6300 mg | INHALATION_SOLUTION | Freq: Four times a day (QID) | RESPIRATORY_TRACT | Status: DC
  Administered 2011-02-08 (×3): 0.63 mg via RESPIRATORY_TRACT
  Filled 2011-02-08 (×4): qty 3

## 2011-02-08 MED ORDER — LEVALBUTEROL HCL 0.63 MG/3ML IN NEBU
0.6300 mg | INHALATION_SOLUTION | Freq: Three times a day (TID) | RESPIRATORY_TRACT | Status: DC
  Administered 2011-02-09 – 2011-02-10 (×3): 0.63 mg via RESPIRATORY_TRACT
  Filled 2011-02-08 (×7): qty 3

## 2011-02-08 NOTE — Progress Notes (Signed)
Speech Language/Pathology  Reviewed chart and spoke with RN. Patient has just come off venti-mask, now on nasal cannula but remains lethargic per RN. Patient with h/o dysphagia/aspiration documented in 2008 and 2009. Given hx and question of aspiration PNA dx on admission, recommend holding off on evaluation of swallow until respiratory status stabilizes as poor respiratory status will further increase aspiration risk. SLP will f/u 12/30 am.   Ferdinand Lango MA, CCC-SLP [phone]

## 2011-02-08 NOTE — Progress Notes (Signed)
PT Cancellation Note  PT received and appreciate Vestibular Evaluation order. Per chart review pt still in acute respiratory failure and required being placed on venturi mask today, MD questioning step down. Spoke with nursing who feel that physical therapy evaluation/treatment this morning may exacerbate symptoms. PT will check back later today and evaluate if appropriate at that time. Thank you,  Dahlia Client (Beverely Pace) Carleene Mains PT, DPT Acute Rehabilitation [phone]

## 2011-02-08 NOTE — Progress Notes (Signed)
Patient ID: Malik Zuniga, male   DOB: [birth year], 65 y.o.   MRN: [id number] Subjective: Patient see.Denies any chest pain  Objective: Weight change:   Intake/Output Summary (Last 24 hours) at 02/08/11 0837 Last data filed at 02/07/11 1900  Gross per 24 hour  Intake      0 ml  Output    300 ml  Net   -300 ml   BP 135/81  Pulse 104  Temp(Src) 98 F (36.7 C) (Axillary)  Resp 26  SpO2 92% Physical Exam: General appearance: alert, cooperative on 02 via non re-breather mask Head: Normocephalic, without obvious abnormality, atraumatic Neck: no adenopathy, no carotid bruit, no JVD, supple, symmetrical, trachea midline and thyroid not enlarged, symmetric, no tenderness/mass/nodules Lungs: minimally scattered rhonchi with rales Heart: regular rate and rhythm, S1, S2 normal, no murmur, click, rub or gallop Abdomen: soft, non-tender; bowel sounds normal; no masses,  no organomegaly Extremities: extremities normal, atraumatic, no cyanosis or edema Skin: normal turgor  Lab Results: Results for orders placed during the hospital encounter of 02/07/11 (from the past 48 hour(s))  CBC     Status: Abnormal   Collection Time   02/07/11  3:00 PM      Component Value Range Comment   WBC 5.5  4.0 - 10.5 (K/uL)    RBC 3.86 (*) 4.22 - 5.81 (MIL/uL)    Hemoglobin 12.4 (*) 13.0 - 17.0 (g/dL)    HCT 41.3 (*) 24.4 - 52.0 (%)    MCV 93.3  78.0 - 100.0 (fL)    MCH 32.1  26.0 - 34.0 (pg)    MCHC 34.4  30.0 - 36.0 (g/dL)    RDW 01.0  27.2 - 53.6 (%)    Platelets 253  150 - 400 (K/uL)   DIFFERENTIAL     Status: Abnormal   Collection Time   02/07/11  3:00 PM      Component Value Range Comment   Neutrophils Relative 54  43 - 77 (%)    Neutro Abs 2.9  1.7 - 7.7 (K/uL)    Lymphocytes Relative 22  12 - 46 (%)    Lymphs Abs 1.2  0.7 - 4.0 (K/uL)    Monocytes Relative 23 (*) 3 - 12 (%)    Monocytes Absolute 1.2 (*) 0.1 - 1.0 (K/uL)    Eosinophils Relative 1  0 - 5 (%)    Eosinophils Absolute 0.1  0.0 -  0.7 (K/uL)    Basophils Relative 0  0 - 1 (%)    Basophils Absolute 0.0  0.0 - 0.1 (K/uL)   POCT I-STAT, CHEM 8     Status: Abnormal   Collection Time   02/07/11  3:13 PM      Component Value Range Comment   Sodium 137  135 - 145 (mEq/L)    Potassium 4.0  3.5 - 5.1 (mEq/L)    Chloride 102  96 - 112 (mEq/L)    BUN 8  6 - 23 (mg/dL)    Creatinine, Ser 6.44  0.50 - 1.35 (mg/dL)    Glucose, Bld 034 (*) 70 - 99 (mg/dL)    Calcium, Ion 7.42  1.12 - 1.32 (mmol/L)    TCO2 29  0 - 100 (mmol/L)    Hemoglobin 13.6  13.0 - 17.0 (g/dL)    HCT 59.5  63.8 - 75.6 (%)   BLOOD GAS, ARTERIAL     Status: Abnormal   Collection Time   02/07/11  7:41 PM      Component  Value Range Comment   O2 Content 4.0      Delivery systems NASAL CANNULA      pH, Arterial 7.445  7.350 - 7.450     pCO2 arterial 36.5  35.0 - 45.0 (mmHg)    pO2, Arterial 61.9 (*) 80.0 - 100.0 (mmHg)    Bicarbonate 24.7 (*) 20.0 - 24.0 (mEq/L)    TCO2 25.8  0 - 100 (mmol/L)    Acid-Base Excess 1.1  0.0 - 2.0 (mmol/L)    O2 Saturation 93.2      Patient temperature 98.6      Collection site RIGHT RADIAL      Drawn by [phone]      Sample type ARTERIAL DRAW      Allens test (pass/fail) PASS  PASS    CBC     Status: Abnormal   Collection Time   02/07/11  8:21 PM      Component Value Range Comment   WBC 4.4  4.0 - 10.5 (K/uL)    RBC 3.44 (*) 4.22 - 5.81 (MIL/uL)    Hemoglobin 10.7 (*) 13.0 - 17.0 (g/dL) DELTA CHECK NOTED   HCT 31.8 (*) 39.0 - 52.0 (%)    MCV 92.4  78.0 - 100.0 (fL)    MCH 31.1  26.0 - 34.0 (pg)    MCHC 33.6  30.0 - 36.0 (g/dL)    RDW 60.4  54.0 - 98.1 (%)    Platelets 246  150 - 400 (K/uL)   CREATININE, SERUM     Status: Normal   Collection Time   02/07/11  8:21 PM      Component Value Range Comment   Creatinine, Ser 0.59  0.50 - 1.35 (mg/dL)    GFR calc non Af Amer >90  >90 (mL/min)    GFR calc Af Amer >90  >90 (mL/min)   CARDIAC PANEL(CRET KIN+CKTOT+MB+TROPI)     Status: Normal   Collection Time   02/07/11   8:22 PM      Component Value Range Comment   Total CK 44  7 - 232 (U/L)    CK, MB 1.3  0.3 - 4.0 (ng/mL)    Troponin I <0.30  <0.30 (ng/mL)    Relative Index RELATIVE INDEX IS INVALID  0.0 - 2.5    CARDIAC PANEL(CRET KIN+CKTOT+MB+TROPI)     Status: Normal   Collection Time   02/08/11 12:49 AM      Component Value Range Comment   Total CK 43  7 - 232 (U/L)    CK, MB 1.2  0.3 - 4.0 (ng/mL)    Troponin I <0.30  <0.30 (ng/mL)    Relative Index RELATIVE INDEX IS INVALID  0.0 - 2.5      Micro Results: No results found for this or any previous visit (from the past 240 hour(s)).  Studies/Results: Dg Chest 2 View  01/14/2011  *RADIOLOGY REPORT*  Clinical Data: Persistent cough.  COPD exacerbation.  CHEST - 2 VIEW  Comparison: 11/26/2010  Findings: The heart size appears normal.  No pleural effusion or pulmonary edema.  Plate-like atelectasis versus scarring is noted within the left midlung.  No airspace consolidation noted.  Review of the visualized osseous structures is unremarkable.  IMPRESSION:  1.  Left midlung scar versus plate-like atelectasis. 2.  No evidence for pneumonia.  Original Report Authenticated By: Rosealee Albee, M.D.   Dg Chest Portable 1 View  02/07/2011  *RADIOLOGY REPORT*  Clinical Data: Cough, fever  PORTABLE CHEST - 1 VIEW  Comparison: 01/14/2011  Findings: Cardiomediastinal silhouette is stable.  No acute infiltrate or pulmonary edema.  Stable linear scarring or atelectasis in the lingula and left base.  IMPRESSION:  No acute infiltrate or pulmonary edema.  Stable linear scarring or atelectasis in the lingula and left base.  Original Report Authenticated By: Natasha Mead, M.D.   Medications: Scheduled Meds:   . sodium chloride   Intravenous Once  . albuterol  5 mg Nebulization Once  . aspirin EC  81 mg Oral NOW  . azithromycin  500 mg Intravenous Q24H  . budesonide-formoterol  2 puff Inhalation BID  . cefTRIAXone (ROCEPHIN)  IV  1 g Intravenous Q24H  . divalproex   1,000 mg Oral QHS  . heparin  5,000 Units Subcutaneous Q8H  . levalbuterol  0.63 mg Nebulization Q6H  . methylPREDNISolone (SOLU-MEDROL) injection  60 mg Intravenous Q6H  . metoprolol  100 mg Oral Daily  . PARoxetine  60 mg Oral Daily  . primidone  250 mg Oral BID  . sodium chloride  250 mL Intravenous Once  . traZODone  150 mg Oral QHS  . DISCONTD: aspirin  81 mg Oral Daily  . DISCONTD: aspirin  81 mg Oral Daily  . DISCONTD: levalbuterol  0.63 mg Nebulization 6 X Daily   Continuous Infusions:  PRN Meds:.acetaminophen, ipratropium, levalbuterol  Assessment/Plan: #1 Copd Exacerbation-will continue breathing treatment and iv rocephin and zithromax. #2 Acute Respiratory failure-secondary to copd.Plan is to stat oxygen via nasal canulla and d/c non re-breather mask. #3 Hx of Bipolar Affective Disorder-will continue trazodone #4 HTN-Bp fairly stable #6 Prior Hx of CVA Will continue to follow patient.    LOS: 1 day   Calder Oblinger 02/08/2011, 8:37 AM

## 2011-02-08 NOTE — Progress Notes (Signed)
INITIAL ADULT NUTRITION ASSESSMENT Date: 02/08/2011   Time: 9:35 AM Reason for Assessment: Consult for dysphagia and recent weight loss >10 lbs.  ASSESSMENT: Male 65 y.o.  Dx: SOB and coughing for 3 weeks; Influenza  Hx:  Past Medical History  Diagnosis Date  . Myocardial infarction   . Hyperlipidemia   . Hypertension   . Depression   . Emphysema of lung   . Tremor   . Headache, chronic daily   . History of prostate cancer   . Cancer 2004    prostate  . On home O2   . Aspiration pneumonia   . Stroke     "mini strokes"; denies residual  . Emphysema   . Neuromuscular disorder 1998    right carpal tunnel release    Related Meds:  Scheduled Meds:   . sodium chloride   Intravenous Once  . albuterol  5 mg Nebulization Once  . aspirin EC  81 mg Oral NOW  . azithromycin  500 mg Intravenous Q24H  . budesonide-formoterol  2 puff Inhalation BID  . cefTRIAXone (ROCEPHIN)  IV  1 g Intravenous Q24H  . divalproex  1,000 mg Oral QHS  . heparin  5,000 Units Subcutaneous Q8H  . levalbuterol  0.63 mg Nebulization Q6H  . methylPREDNISolone (SOLU-MEDROL) injection  60 mg Intravenous Q6H  . metoprolol  100 mg Oral Daily  . PARoxetine  60 mg Oral Daily  . primidone  250 mg Oral BID  . sodium chloride  250 mL Intravenous Once  . traZODone  150 mg Oral QHS  . DISCONTD: aspirin  81 mg Oral Daily  . DISCONTD: aspirin  81 mg Oral Daily  . DISCONTD: levalbuterol  0.63 mg Nebulization 6 X Daily   Continuous Infusions:  PRN Meds:.acetaminophen, ipratropium, levalbuterol   Ht:  5\' 10"  (177.8 cm)  Wt:  150 lb (68.2 kg)  Ideal Wt: 75.5 kg % Ideal Wt: 90%  Usual Wt: 175# % Usual Wt: 86%  BMI=21.6  Food/Nutrition Related Hx: G-tube with TF several years ago per wife; Hx of dysphagia; was eating poorly PTA with 25# weight loss in the past 3 weeks; poor appetite due to respiratory/breathing issues PTA.  Labs:  CMP     Component Value Date/Time   NA 134* 02/08/2011 0915   K 3.7  02/08/2011 0915   CL 98 02/08/2011 0915   CO2 24 02/08/2011 0915   GLUCOSE 164* 02/08/2011 0915   GLUCOSE 88 12/10/2009   BUN 9 02/08/2011 0915   CREATININE 0.54 02/08/2011 0915   CALCIUM 9.4 02/08/2011 0915   PROT 7.2 02/08/2011 0915   ALBUMIN 2.6* 02/08/2011 0915   AST 17 02/08/2011 0915   ALT 45 02/08/2011 0915   ALKPHOS 73 02/08/2011 0915   BILITOT 0.2* 02/08/2011 0915   GFRNONAA >90 02/08/2011 0915   GFRAA >90 02/08/2011 0915     Intake/Output Summary (Last 24 hours) at 02/08/11 1023 Last data filed at 02/07/11 1900  Gross per 24 hour  Intake      0 ml  Output    300 ml  Net   -300 ml    Diet Order: NPO  Supplements/Tube Feeding:  None  IVF:  None  Estimated Nutritional Needs:   Kcal: 1850-2050 Protein: 95-115 grams Fluid: 1.9-2.1 liters  SLP has been consulted for swallow evaluation--has been postponed until tomorrow due to current respiratory issues.  Patient was just transitioned from venti-mask to nasal cannula and is lethargic at this time.    NUTRITION DIAGNOSIS: -  Inadequate oral intake (NI-2.1).  Status: Ongoing  RELATED TO: poor appetite and difficulty breathing  AS EVIDENCED BY: 25 lb. weight loss PTA and current NPO diet.  MONITORING/EVALUATION(Goals): Diet advancement as tolerated with good intake to meet nutrition needs versus TF to meet nutrition needs.  Monitor for ability to begin PO diet pending results of swallow evaluation, weight trend, labs.  EDUCATION NEEDS: -Education not appropriate at this time  INTERVENTION: If patient is unable to take PO's safely, recommend place enteral feeding tube and begin TF with Jevity 1.2 at 15 ml/h, increase by 10 ml every 4 hours to goal of 65 ml/h with Prostat 30 ml once daily to provide 1944 kcals, 102 grams protein, 1264 ml free water daily.  Dietitian #:  [phone]  DOCUMENTATION CODES Per approved criteria  -Severe malnutrition in the context of acute illness or injury    Malik Zuniga 02/08/2011, 9:35 AM

## 2011-02-09 LAB — COMPREHENSIVE METABOLIC PANEL
AST: 21 U/L (ref 0–37)
Albumin: 2.7 g/dL — ABNORMAL LOW (ref 3.5–5.2)
Alkaline Phosphatase: 75 U/L (ref 39–117)
BUN: 15 mg/dL (ref 6–23)
Chloride: 98 mEq/L (ref 96–112)
Creatinine, Ser: 0.5 mg/dL (ref 0.50–1.35)
Potassium: 4.1 mEq/L (ref 3.5–5.1)
Total Bilirubin: 0.2 mg/dL — ABNORMAL LOW (ref 0.3–1.2)
Total Protein: 7.4 g/dL (ref 6.0–8.3)

## 2011-02-09 LAB — CBC
HCT: 33.5 % — ABNORMAL LOW (ref 39.0–52.0)
MCHC: 34 g/dL (ref 30.0–36.0)
Platelets: 344 10*3/uL (ref 150–400)
RDW: 13.7 % (ref 11.5–15.5)
WBC: 9.4 10*3/uL (ref 4.0–10.5)

## 2011-02-09 LAB — CARDIAC PANEL(CRET KIN+CKTOT+MB+TROPI)
CK, MB: 2.6 ng/mL (ref 0.3–4.0)
CK, MB: 2.9 ng/mL (ref 0.3–4.0)
CK, MB: 3.4 ng/mL (ref 0.3–4.0)
Relative Index: INVALID (ref 0.0–2.5)
Relative Index: INVALID (ref 0.0–2.5)
Relative Index: INVALID (ref 0.0–2.5)
Total CK: 66 U/L (ref 7–232)
Total CK: 86 U/L (ref 7–232)
Total CK: 85 U/L (ref 7–232)
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
## 2011-02-09 NOTE — Progress Notes (Signed)
Patient ID: Malik Zuniga, male   DOB: 06/21/1945, 65 y.o.   MRN: [id number] Patient ID: Malik Zuniga, male   DOB: [birth year], 65 y.o.   MRN: [id number] Subjective: Patient seen.Feels better.Denies any specific complaints  Objective: Weight change:   Intake/Output Summary (Last 24 hours) at 02/09/11 1458 Last data filed at 02/09/11 0600  Gross per 24 hour  Intake    980 ml  Output      3 ml  Net    977 ml   BP 137/82  Pulse 82  Temp(Src) 97.2 F (36.2 C) (Oral)  Resp 20  SpO2 95% Physical Exam: General appearance: alert, cooperative on 02 via non re-breather mask Head: Normocephalic, without obvious abnormality, atraumatic Neck: no adenopathy, no carotid bruit, no JVD, supple, symmetrical, trachea midline and thyroid not enlarged, symmetric, no tenderness/mass/nodules Lungs: good air entry with minimal rhonchi at the lung bases Heart: regular rate and rhythm, S1, S2 normal, no murmur, click, rub or gallop Abdomen: soft, non-tender; bowel sounds normal; no masses,  no organomegaly Extremities: extremities normal, atraumatic, no cyanosis or edema Skin: normal turgor  Lab Results: Results for orders placed during the hospital encounter of 02/07/11 (from the past 48 hour(s))  CBC     Status: Abnormal   Collection Time   02/07/11  3:00 PM      Component Value Range Comment   WBC 5.5  4.0 - 10.5 (K/uL)    RBC 3.86 (*) 4.22 - 5.81 (MIL/uL)    Hemoglobin 12.4 (*) 13.0 - 17.0 (g/dL)    HCT 78.2 (*) 95.6 - 52.0 (%)    MCV 93.3  78.0 - 100.0 (fL)    MCH 32.1  26.0 - 34.0 (pg)    MCHC 34.4  30.0 - 36.0 (g/dL)    RDW 21.3  08.6 - 57.8 (%)    Platelets 253  150 - 400 (K/uL)   DIFFERENTIAL     Status: Abnormal   Collection Time   02/07/11  3:00 PM      Component Value Range Comment   Neutrophils Relative 54  43 - 77 (%)    Neutro Abs 2.9  1.7 - 7.7 (K/uL)    Lymphocytes Relative 22  12 - 46 (%)    Lymphs Abs 1.2  0.7 - 4.0 (K/uL)    Monocytes Relative 23 (*) 3 - 12 (%)    Monocytes Absolute 1.2 (*) 0.1 - 1.0 (K/uL)    Eosinophils Relative 1  0 - 5 (%)    Eosinophils Absolute 0.1  0.0 - 0.7 (K/uL)    Basophils Relative 0  0 - 1 (%)    Basophils Absolute 0.0  0.0 - 0.1 (K/uL)   POCT I-STAT, CHEM 8     Status: Abnormal   Collection Time   02/07/11  3:13 PM      Component Value Range Comment   Sodium 137  135 - 145 (mEq/L)    Potassium 4.0  3.5 - 5.1 (mEq/L)    Chloride 102  96 - 112 (mEq/L)    BUN 8  6 - 23 (mg/dL)    Creatinine, Ser 4.69  0.50 - 1.35 (mg/dL)    Glucose, Bld 629 (*) 70 - 99 (mg/dL)    Calcium, Ion 5.28  1.12 - 1.32 (mmol/L)    TCO2 29  0 - 100 (mmol/L)    Hemoglobin 13.6  13.0 - 17.0 (g/dL)    HCT 41.3  24.4 - 01.0 (%)   CULTURE, BLOOD (ROUTINE  X 2)     Status: Normal (Preliminary result)   Collection Time   02/07/11  7:15 PM      Component Value Range Comment   Specimen Description BLOOD RIGHT HAND      Special Requests BOTTLES DRAWN AEROBIC AND ANAEROBIC 10CC EACH      Setup Time [id number]      Culture        Value:        BLOOD CULTURE RECEIVED NO GROWTH TO DATE CULTURE WILL BE HELD FOR 5 DAYS BEFORE ISSUING A FINAL NEGATIVE REPORT   Report Status PENDING     CULTURE, BLOOD (ROUTINE X 2)     Status: Normal (Preliminary result)   Collection Time   02/07/11  7:30 PM      Component Value Range Comment   Specimen Description BLOOD LEFT ARM      Special Requests        Value: BOTTLES DRAWN AEROBIC AND ANAEROBIC 10CC BLUE,6CC RED   Setup Time [id number]      Culture        Value:        BLOOD CULTURE RECEIVED NO GROWTH TO DATE CULTURE WILL BE HELD FOR 5 DAYS BEFORE ISSUING A FINAL NEGATIVE REPORT   Report Status PENDING     BLOOD GAS, ARTERIAL     Status: Abnormal   Collection Time   02/07/11  7:41 PM      Component Value Range Comment   O2 Content 4.0      Delivery systems NASAL CANNULA      pH, Arterial 7.445  7.350 - 7.450     pCO2 arterial 36.5  35.0 - 45.0 (mmHg)    pO2, Arterial 61.9 (*) 80.0 - 100.0 (mmHg)     Bicarbonate 24.7 (*) 20.0 - 24.0 (mEq/L)    TCO2 25.8  0 - 100 (mmol/L)    Acid-Base Excess 1.1  0.0 - 2.0 (mmol/L)    O2 Saturation 93.2      Patient temperature 98.6      Collection site RIGHT RADIAL      Drawn by [phone]      Sample type ARTERIAL DRAW      Allens test (pass/fail) PASS  PASS    CBC     Status: Abnormal   Collection Time   02/07/11  8:21 PM      Component Value Range Comment   WBC 4.4  4.0 - 10.5 (K/uL)    RBC 3.44 (*) 4.22 - 5.81 (MIL/uL)    Hemoglobin 10.7 (*) 13.0 - 17.0 (g/dL) DELTA CHECK NOTED   HCT 31.8 (*) 39.0 - 52.0 (%)    MCV 92.4  78.0 - 100.0 (fL)    MCH 31.1  26.0 - 34.0 (pg)    MCHC 33.6  30.0 - 36.0 (g/dL)    RDW 65.7  84.6 - 96.2 (%)    Platelets 246  150 - 400 (K/uL)   CREATININE, SERUM     Status: Normal   Collection Time   02/07/11  8:21 PM      Component Value Range Comment   Creatinine, Ser 0.59  0.50 - 1.35 (mg/dL)    GFR calc non Af Amer >90  >90 (mL/min)    GFR calc Af Amer >90  >90 (mL/min)   CARDIAC PANEL(CRET KIN+CKTOT+MB+TROPI)     Status: Normal   Collection Time   02/07/11  8:22 PM      Component Value Range Comment   Total  CK 44  7 - 232 (U/L)    CK, MB 1.3  0.3 - 4.0 (ng/mL)    Troponin I <0.30  <0.30 (ng/mL)    Relative Index RELATIVE INDEX IS INVALID  0.0 - 2.5    CARDIAC PANEL(CRET KIN+CKTOT+MB+TROPI)     Status: Normal   Collection Time   02/08/11 12:49 AM      Component Value Range Comment   Total CK 43  7 - 232 (U/L)    CK, MB 1.2  0.3 - 4.0 (ng/mL)    Troponin I <0.30  <0.30 (ng/mL)    Relative Index RELATIVE INDEX IS INVALID  0.0 - 2.5    CARDIAC PANEL(CRET KIN+CKTOT+MB+TROPI)     Status: Normal   Collection Time   02/08/11  9:15 AM      Component Value Range Comment   Total CK 42  7 - 232 (U/L)    CK, MB 1.4  0.3 - 4.0 (ng/mL)    Troponin I <0.30  <0.30 (ng/mL)    Relative Index RELATIVE INDEX IS INVALID  0.0 - 2.5    COMPREHENSIVE METABOLIC PANEL     Status: Abnormal   Collection Time   02/08/11  9:15 AM        Component Value Range Comment   Sodium 134 (*) 135 - 145 (mEq/L)    Potassium 3.7  3.5 - 5.1 (mEq/L)    Chloride 98  96 - 112 (mEq/L)    CO2 24  19 - 32 (mEq/L)    Glucose, Bld 164 (*) 70 - 99 (mg/dL)    BUN 9  6 - 23 (mg/dL)    Creatinine, Ser 9.56  0.50 - 1.35 (mg/dL)    Calcium 9.4  8.4 - 10.5 (mg/dL)    Total Protein 7.2  6.0 - 8.3 (g/dL)    Albumin 2.6 (*) 3.5 - 5.2 (g/dL)    AST 17  0 - 37 (U/L)    ALT 45  0 - 53 (U/L)    Alkaline Phosphatase 73  39 - 117 (U/L)    Total Bilirubin 0.2 (*) 0.3 - 1.2 (mg/dL)    GFR calc non Af Amer >90  >90 (mL/min)    GFR calc Af Amer >90  >90 (mL/min)   CBC     Status: Abnormal   Collection Time   02/08/11  9:15 AM      Component Value Range Comment   WBC 4.2  4.0 - 10.5 (K/uL)    RBC 3.47 (*) 4.22 - 5.81 (MIL/uL)    Hemoglobin 11.0 (*) 13.0 - 17.0 (g/dL)    HCT 21.3 (*) 08.6 - 52.0 (%)    MCV 91.1  78.0 - 100.0 (fL)    MCH 31.7  26.0 - 34.0 (pg)    MCHC 34.8  30.0 - 36.0 (g/dL)    RDW 57.8  46.9 - 62.9 (%)    Platelets 244  150 - 400 (K/uL)   CARDIAC PANEL(CRET KIN+CKTOT+MB+TROPI)     Status: Normal   Collection Time   02/08/11 12:40 PM      Component Value Range Comment   Total CK 47  7 - 232 (U/L)    CK, MB 1.4  0.3 - 4.0 (ng/mL)    Troponin I <0.30  <0.30 (ng/mL)    Relative Index RELATIVE INDEX IS INVALID  0.0 - 2.5    CARDIAC PANEL(CRET KIN+CKTOT+MB+TROPI)     Status: Normal   Collection Time   02/08/11  8:01 PM  Component Value Range Comment   Total CK 64  7 - 232 (U/L)    CK, MB 2.1  0.3 - 4.0 (ng/mL)    Troponin I <0.30  <0.30 (ng/mL)    Relative Index RELATIVE INDEX IS INVALID  0.0 - 2.5    CARDIAC PANEL(CRET KIN+CKTOT+MB+TROPI)     Status: Normal   Collection Time   02/09/11  1:18 AM      Component Value Range Comment   Total CK 66  7 - 232 (U/L)    CK, MB 2.6  0.3 - 4.0 (ng/mL)    Troponin I <0.30  <0.30 (ng/mL)    Relative Index RELATIVE INDEX IS INVALID  0.0 - 2.5    CBC     Status: Abnormal    Collection Time   02/09/11  9:01 AM      Component Value Range Comment   WBC 9.4  4.0 - 10.5 (K/uL)    RBC 3.67 (*) 4.22 - 5.81 (MIL/uL)    Hemoglobin 11.4 (*) 13.0 - 17.0 (g/dL)    HCT 78.2 (*) 95.6 - 52.0 (%)    MCV 91.3  78.0 - 100.0 (fL)    MCH 31.1  26.0 - 34.0 (pg)    MCHC 34.0  30.0 - 36.0 (g/dL)    RDW 21.3  08.6 - 57.8 (%)    Platelets 344  150 - 400 (K/uL) DELTA CHECK NOTED  COMPREHENSIVE METABOLIC PANEL     Status: Abnormal   Collection Time   02/09/11  9:01 AM      Component Value Range Comment   Sodium 136  135 - 145 (mEq/L)    Potassium 4.1  3.5 - 5.1 (mEq/L)    Chloride 98  96 - 112 (mEq/L)    CO2 23  19 - 32 (mEq/L)    Glucose, Bld 142 (*) 70 - 99 (mg/dL)    BUN 15  6 - 23 (mg/dL)    Creatinine, Ser 4.69  0.50 - 1.35 (mg/dL)    Calcium 9.7  8.4 - 10.5 (mg/dL)    Total Protein 7.4  6.0 - 8.3 (g/dL)    Albumin 2.7 (*) 3.5 - 5.2 (g/dL)    AST 21  0 - 37 (U/L) HEMOLYSIS AT THIS LEVEL MAY AFFECT RESULT   ALT 36  0 - 53 (U/L)    Alkaline Phosphatase 75  39 - 117 (U/L)    Total Bilirubin 0.2 (*) 0.3 - 1.2 (mg/dL)    GFR calc non Af Amer >90  >90 (mL/min)    GFR calc Af Amer >90  >90 (mL/min)   CARDIAC PANEL(CRET KIN+CKTOT+MB+TROPI)     Status: Normal   Collection Time   02/09/11  9:04 AM      Component Value Range Comment   Total CK 86  7 - 232 (U/L)    CK, MB 2.9  0.3 - 4.0 (ng/mL)    Troponin I <0.30  <0.30 (ng/mL)    Relative Index RELATIVE INDEX IS INVALID  0.0 - 2.5    CARDIAC PANEL(CRET KIN+CKTOT+MB+TROPI)     Status: Normal   Collection Time   02/09/11 12:42 PM      Component Value Range Comment   Total CK 85  7 - 232 (U/L)    CK, MB 3.4  0.3 - 4.0 (ng/mL)    Troponin I <0.30  <0.30 (ng/mL)    Relative Index RELATIVE INDEX IS INVALID  0.0 - 2.5      Micro Results: Recent Results (  from the past 240 hour(s))  CULTURE, BLOOD (ROUTINE X 2)     Status: Normal (Preliminary result)   Collection Time   02/07/11  7:15 PM      Component Value Range Status  Comment   Specimen Description BLOOD RIGHT HAND   Final    Special Requests BOTTLES DRAWN AEROBIC AND ANAEROBIC 10CC EACH   Final    Setup Time [id number]   Final    Culture     Final    Value:        BLOOD CULTURE RECEIVED NO GROWTH TO DATE CULTURE WILL BE HELD FOR 5 DAYS BEFORE ISSUING A FINAL NEGATIVE REPORT   Report Status PENDING   Incomplete   CULTURE, BLOOD (ROUTINE X 2)     Status: Normal (Preliminary result)   Collection Time   02/07/11  7:30 PM      Component Value Range Status Comment   Specimen Description BLOOD LEFT ARM   Final    Special Requests     Final    Value: BOTTLES DRAWN AEROBIC AND ANAEROBIC 10CC BLUE,6CC RED   Setup Time [id number]   Final    Culture     Final    Value:        BLOOD CULTURE RECEIVED NO GROWTH TO DATE CULTURE WILL BE HELD FOR 5 DAYS BEFORE ISSUING A FINAL NEGATIVE REPORT   Report Status PENDING   Incomplete     Studies/Results: Dg Chest 2 View  01/14/2011  *RADIOLOGY REPORT*  Clinical Data: Persistent cough.  COPD exacerbation.  CHEST - 2 VIEW  Comparison: 11/26/2010  Findings: The heart size appears normal.  No pleural effusion or pulmonary edema.  Plate-like atelectasis versus scarring is noted within the left midlung.  No airspace consolidation noted.  Review of the visualized osseous structures is unremarkable.  IMPRESSION:  1.  Left midlung scar versus plate-like atelectasis. 2.  No evidence for pneumonia.  Original Report Authenticated By: Rosealee Albee, M.D.   Dg Chest Portable 1 View  02/07/2011  *RADIOLOGY REPORT*  Clinical Data: Cough, fever  PORTABLE CHEST - 1 VIEW  Comparison: 01/14/2011  Findings: Cardiomediastinal silhouette is stable.  No acute infiltrate or pulmonary edema.  Stable linear scarring or atelectasis in the lingula and left base.  IMPRESSION:  No acute infiltrate or pulmonary edema.  Stable linear scarring or atelectasis in the lingula and left base.  Original Report Authenticated By: Natasha Mead, M.D.    Medications: Scheduled Meds:    . azithromycin  500 mg Intravenous Q24H  . budesonide-formoterol  2 puff Inhalation BID  . cefTRIAXone (ROCEPHIN)  IV  1 g Intravenous Q24H  . divalproex  1,000 mg Oral QHS  . heparin  5,000 Units Subcutaneous Q8H  . levalbuterol  0.63 mg Nebulization TID  . methylPREDNISolone (SOLU-MEDROL) injection  60 mg Intravenous Q6H  . metoprolol  100 mg Oral Daily  . PARoxetine  60 mg Oral Daily  . primidone  250 mg Oral BID  . traZODone  150 mg Oral QHS  . DISCONTD: levalbuterol  0.63 mg Nebulization Q6H   Continuous Infusions:  PRN Meds:.acetaminophen, ipratropium, levalbuterol  Assessment/Plan: #1 Copd Exacerbation-will continue breathing treatment and iv rocephin and zithromax. #2 Acute Respiratory failure-secondary to copd.Remarkably improved.Tolerating atmospheric oxygen #3 Hx of Bipolar Affective Disorder-will continue trazodone #4 HTN-Bp fairly stable #6 Prior Hx of CVA For d/c on 02/10/2011   LOS: 2 days   Liberti Appleton 02/09/2011, 2:58 PM

## 2011-02-09 NOTE — Assessment & Plan Note (Signed)
Deteriorated.  Pt in respiratory distress.  Advised EMS that this is an acute exacerbation of chronic problem so as not to get O2 so high that it takes away his drive to breathe.

## 2011-02-09 NOTE — Progress Notes (Signed)
Physical Therapy Evaluation Patient Details Name: Malik Zuniga MRN: [id number] DOB: 09-30-1945 Today's Date: 02/09/2011  Problem List:  Patient Active Problem List  Diagnoses  . CANDIDIASIS  . HYPERLIPIDEMIA  . BIPOLAR DISORDER UNSPECIFIED  . DEPRESSION  . HYPERTENSION  . MYOCARDIAL INFARCTION  . C V A / STROKE  . TREMOR  . PROSTATE CANCER, HX OF  . CEREBROVASCULAR ACCIDENT, HX OF  . HEADACHE, CHRONIC, HX OF  . Neoplasm of uncertain behavior of skin  . Angioedema  . Allergic rhinitis, seasonal  . COPD (chronic obstructive pulmonary disease)  . Acute respiratory infection    Past Medical History:  Past Medical History  Diagnosis Date  . Myocardial infarction   . Hyperlipidemia   . Hypertension   . Depression   . Emphysema of lung   . Tremor   . Headache, chronic daily   . History of prostate cancer   . Cancer 2004    prostate  . On home O2   . Aspiration pneumonia   . Stroke     "mini strokes"; denies residual  . Emphysema   . Neuromuscular disorder 1998    right carpal tunnel release   Past Surgical History:  Past Surgical History  Procedure Date  . Cholecystectomy 1996  . 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 Jordan Likes  . Vascular stent 2000    Dr. Alanda Amass  . Hand surgery 1989    crushed thumb, Dr. Merlyn Lot  . Ulnar nerve repair 1998    left arm, Dr. Merlyn Lot  . Gastrostomy w/ feeding tube 2008    Dr. Ewing Schlein  . 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 Prostatectomy 04/2001    removal of prostate cancer, Dr. Brunilda Payor  . Back surgery 08/2004; 02/2005; 04/2006; 06/2007; 7/20101    all by Dr. Jordan Likes  . Coronary angioplasty with stent placement 04/1998  . Carpal tunnel release 1998    right    PT Assessment/Plan/Recommendation PT Assessment Clinical Impression Statement: Pt presents with COPD exacerbation and deconditioning. Pt required up to moderate assistance to ambulated with RW. When PT entered room pt had removed [place] O2 (accidently?) and was at 92% SpO2. Pt  desaturated to 85% on room air when sitting EOB. Pt placed on 2L [place] O2 and returned to 93% SpO2. Pt ambulated  ~15' and while sitting on commmode desaturated to 85% on 2L [place] O2, with cues for deep breathing pt increased to 94%. After ambulation to chair (15') pt desaturated to 90% on 2L O2, and increased to 93% with seated rest and cues for breating. Discussed need for further rehab prior to D/C as pt currently requires too much assistance for wife, will update this if pt able to reach supervision with all mobility. Pt will have to be able to ascend 8-9 stairs to be able to get into house as well.  PT Recommendation/Assessment: Patient will need skilled PT in the acute care venue PT Problem List: Decreased strength;Decreased activity tolerance;Decreased balance;Decreased mobility;Decreased knowledge of use of DME;Decreased safety awareness;Cardiopulmonary status limiting activity Barriers to Discharge: Inaccessible home environment;Decreased caregiver support Barriers to Discharge Comments: Will need SNF unless can reach supervisional level with all mobility  PT Therapy Diagnosis : Difficulty walking;Abnormality of gait;Generalized weakness;Altered mental status PT Plan PT Frequency: Min 3X/week PT Treatment/Interventions: DME instruction;Gait training;Stair training;Functional mobility training;Therapeutic activities;Therapeutic exercise;Balance training;Patient/family education;Cognitive remediation PT Recommendation Recommendations for Other Services: OT consult (if not already ordered) Follow Up Recommendations: Skilled nursing facility (potentially HHPT  if reaches supervisional level ) PT Goals  Acute Rehab PT Goals PT Goal Formulation: With patient Time For Goal Achievement: 2 weeks Pt will go Sit to Supine/Side: with modified independence PT Goal: Sit to Supine/Side - Progress: Not met Pt will go Sit to Stand: with supervision PT Goal: Sit to Stand - Progress: Not met Pt will go Stand to  Sit: with modified independence PT Goal: Stand to Sit - Progress: Not met Pt will Ambulate: 51 - 150 feet;with rolling walker;with supervision PT Goal: Ambulate - Progress: Not met Pt will Go Up / Down Stairs: 6-9 stairs;with supervision;with least restrictive assistive device PT Goal: Up/Down Stairs - Progress: Not met  PT Evaluation Precautions/Restrictions  Precautions Precautions: Fall Precaution Comments: Secondary to weakness. Prior Functioning  Home Living Lives With: Spouse Receives Help From: Family Type of Home: House Home Layout: One level Home Access: Stairs to enter Entrance Stairs-Rails: Right Entrance Stairs-Number of Steps: 8-9 Bathroom Shower/Tub: Engineer, manufacturing systems: Standard Home Adaptive Equipment: Environmental consultant - rolling;Straight cane Additional Comments: First pt said he did not  have a shower chair, then he reported he had a bench to sit on in the shower.  Prior Function Level of Independence: Needs assistance with ADLs Bath: Moderate Dressing: Moderate Driving: No Vocation: Unemployed Cognition Cognition Arousal/Alertness: Awake/alert Overall Cognitive Status: Impaired Memory: Appears impaired Memory Deficits: Pt with some difficulty remembering home environment.  Orientation Level: Oriented to person;Oriented to place Safety/Judgement: Decreased awareness of safety precautions Decreased Safety/Judgement: Decreased awareness of need for assistance Safety/Judgement - Other Comments: PT instructing pt to not stand up in bathroom without PT there, pt did anyway.  Cognition - Other Comments: Slow to process, attempting to stand 3x with instruction to remain seated while PT set up lines, etc.  Sensation/Coordination Sensation Light Touch: Appears Intact Coordination Gross Motor Movements are Fluid and Coordinated: No Fine Motor Movements are Fluid and Coordinated: No Coordination and Movement Description: Pt with some ataxic like  movements Extremity Assessment RLE Assessment RLE Assessment: Exceptions to [employer] RLE AROM (degrees) Overall AROM Right Lower Extremity: Within functional limits for tasks assessed RLE Strength RLE Overall Strength: Deficits RLE Overall Strength Comments: Generalized deconditioning, grossly >/= 3/5 LLE Assessment LLE Assessment: Exceptions to WFL LLE AROM (degrees) Overall AROM Left Lower Extremity: Within functional limits for tasks assessed LLE Strength LLE Overall Strength: Deficits LLE Overall Strength Comments: Generalized deconditioning, grossly >/= 3/5 Mobility (including Balance) Bed Mobility Bed Mobility: Yes Supine to Sit: 5: Supervision;With rails Supine to Sit Details (indicate cue type and reason): Verbal cues for efficiency, pt using rails.  Transfers Transfers: Yes Sit to Stand: 4: Min assist Sit to Stand Details (indicate cue type and reason): Verbal cues for safe placement of UEs, pt did not follow with first attempt. Pt with initial posterior lean requiring assistance.  Stand to Sit: 5: Supervision;4: Min assist;To chair/3-in-1;To toilet Stand to Sit Details: Supervision to chair, min assist to commode. Verbal cues for sequencing and safe UE placement.  Ambulation/Gait Ambulation/Gait: Yes Ambulation/Gait Assistance: 3: Mod assist Ambulation/Gait Assistance Details (indicate cue type and reason): Up to moderate assistance secondary to posterior lean, excessive hip flexion. Verbal cues required for negotiation of RW particularly in bathroom as pt attempting to pick up RW at times.  Ambulation Distance (Feet): 30 Feet (feet total, one seated rest break. ) Assistive device: Rolling walker Gait Pattern: Ataxic;Step-to pattern;Trunk flexed  Posture/Postural Control Posture/Postural Control: Postural limitations Postural Limitations: significant kyphosis.  Balance Balance Assessed: Yes Static Sitting Balance  Static Sitting - Balance Support: No upper extremity  supported;Feet supported Static Sitting - Level of Assistance: 5: Stand by assistance Static Standing Balance Static Standing - Balance Support: Bilateral upper extremity supported (with RW. ) Static Standing - Level of Assistance: 4: Min assist Static Standing - Comment/# of Minutes: Pt with posterior lean initially.  Exercise  General Exercises - Lower Extremity Ankle Circles/Pumps: AROM;Both;15 reps;Seated (pt encouraged to perform throughout day. ) End of Session PT - End of Session Equipment Utilized During Treatment: Gait belt Activity Tolerance: Patient limited by fatigue;Treatment limited secondary to medical complications (Comment) (desaturation with mobility ) Patient left: in chair;with call bell in reach;with family/visitor present Nurse Communication: Mobility status for transfers;Mobility status for ambulation (desaturation) General Behavior During Session: [facility] for tasks performed Cognition: Impaired Cognitive Impairment: Slow to process at times, see cognition.   Sherie Don 02/09/2011, 12:04 PM  Sherie Don) Carleene Mains PT, DPT Acute Rehabilitation [phone]

## 2011-02-10 ENCOUNTER — Inpatient Hospital Stay (HOSPITAL_COMMUNITY): Payer: Medicare Other

## 2011-02-10 LAB — CARDIAC PANEL(CRET KIN+CKTOT+MB+TROPI)
Relative Index: INVALID (ref 0.0–2.5)
Total CK: 72 U/L (ref 7–232)
Total CK: 62 U/L (ref 7–232)
Troponin I: <0.3 ng/mL (ref <0.30)

## 2011-02-10 MED ORDER — OSELTAMIVIR PHOSPHATE 75 MG PO CAPS: 75.0000 mg | ORAL_CAPSULE | Freq: Two times a day (BID) | ORAL | Status: AC

## 2011-02-10 MED ORDER — ACETAMINOPHEN 325 MG PO TABS: 650.0000 mg | ORAL_TABLET | Freq: Four times a day (QID) | ORAL | Status: AC | PRN

## 2011-02-10 NOTE — Progress Notes (Signed)
   CARE MANAGEMENT NOTE 02/10/2011  Patient:  PLATON, AROCHO   Account Number:  [id number]  Date Initiated:  02/10/2011  Documentation initiated by:  [facility]  Subjective/Objective Assessment:   Admitted with COPD, aspiration pneumonia. Lives with wife. Has home O2 for use at night only thru [facility].     Action/Plan:   PT eval-recommending SNF, patient refusing SNF-agreeable to [facility], HHPT and HHOT   Anticipated DC Date:  02/10/2011   Anticipated DC Plan:  HOME W HOME HEALTH SERVICES      DC Planning Services  CM consult      Choice offered to / List presented to:  C-3 Spouse        HH arranged  HH-1 RN  HH-2 PT  HH-3 OT      [facility] agency  [facility]   Status of service:  Completed, signed off Medicare Important Message given?   (If response is "NO", the following Medicare IM given date fields will be blank) Date Medicare IM given:   Date Additional Medicare IM given:    Discharge Disposition:  HOME W HOME HEALTH SERVICES  Per UR Regulation:  Reviewed for med. necessity/level of care/duration of stay  Comments:  PCP DR. Neena Rhymes  02/10/11 Spoke with patient and his wife about HHC. They have used  [facility] in the past. Explained that [facility] was unable to take new cases presently.They chose [facility] from the [facility] agencies list. Ginnie Smart at [place] and set up [facility], PT and OT. Gave her the H and P, face sheet, order,face to face, and discharge summary. Jacquelynn Cree RN, BSN, CCM

## 2011-02-10 NOTE — Progress Notes (Signed)
02/10/2011 Malik Zuniga Case Management Note [phone]   Utilization review completed.  

## 2011-02-10 NOTE — Discharge Summary (Signed)
DISCHARGE SUMMARY  Malik Zuniga  MR#: [id number]  DOB:22-May-1945  Date of Admission: 02/07/2011 Date of Discharge: 02/10/2011  Attending Physician:Jamai Dolce  Patient's OZH:[id number] Beverely Low, MD, MD  Consults:Treatment Team:  Hollice Espy, MD  Discharge Diagnoses: #1 acute on chronic respiratory failure. Patient is on home O2, 2 L per minute. #2 COPD exacerbation. #3 history of bipolar affective disorder  #4 hypertension #5 prior MI #6 history of CVA #7 chronic tobacco use- still actively smokes. #8 depression #9 hyperlipidemia #10 history of prostate CA     Current Discharge Medication List    START taking these medications   Details  acetaminophen (TYLENOL) 325 MG tablet Take 2 tablets (650 mg total) by mouth every 6 (six) hours as needed. Qty: 50 tablet, Refills: 1    oseltamivir (TAMIFLU) 75 MG capsule Take 1 capsule (75 mg total) by mouth 2 (two) times daily. Qty: 10 capsule, Refills: 0      CONTINUE these medications which have NOT CHANGED   Details  albuterol (VENTOLIN HFA) 108 (90 BASE) MCG/ACT inhaler Inhale 2 puffs into the lungs 4 (four) times daily. Prn Qty: 18 g, Refills: 6    Ascorbic Acid (VITAMIN C) 500 MG tablet Take 500 mg by mouth daily.      aspirin 81 MG EC tablet Take 81 mg by mouth daily.      budesonide-formoterol (SYMBICORT) 160-4.5 MCG/ACT inhaler Inhale 2 puffs into the lungs 2 (two) times daily. Qty: 1 Inhaler, Refills: 3    Cholecalciferol (VITAMIN D3) 1000 UNITS capsule Take 1,000 Units by mouth daily. ON HOLD    dextromethorphan-guaiFENesin (MUCINEX DM) 30-600 MG per 12 hr tablet Take 1 tablet by mouth every 12 (twelve) hours.      diazepam (VALIUM) 5 MG tablet Take 5 mg by mouth every 6 (six) hours as needed. For anxiety     divalproex (DEPAKOTE) 500 MG 24 hr tablet Take 1,000 mg by mouth at bedtime.     DULoxetine (CYMBALTA) 30 MG capsule Take 60 mg by mouth daily.     fluticasone (FLONASE) 50 MCG/ACT  nasal spray 2 sprays by Nasal route daily.      HYDROcodone-acetaminophen (VICODIN) 5-500 MG per tablet Take 1 tablet by mouth 4 (four) times daily as needed for pain. Take one tablet 4 times daily as needed Qty: 120 tablet, Refills: 0    ipratropium-albuterol (DUONEB) 0.5-2.5 (3) MG/3ML SOLN Take 3 mLs by nebulization. Use once via nebulizer 4 times a day     metoprolol (TOPROL-XL) 100 MG 24 hr tablet Take 100 mg by mouth daily.      Omega-3 Fatty Acids (FISH OIL) 1000 MG CAPS Take by mouth. Take one tablet daily. ON HOLD    omeprazole (PRILOSEC) 40 MG capsule Take 40 mg by mouth daily. Take two times a day     ondansetron (ZOFRAN) 8 MG tablet Take 8 mg by mouth every 8 (eight) hours as needed. For nausea     oxyCODONE-acetaminophen (PERCOCET) 5-325 MG per tablet Take 1 tablet by mouth every 6 (six) hours as needed.      PARoxetine (PAXIL) 20 MG tablet Take 60 mg by mouth daily.     Phenyleph-CPM-DM-APAP (ALKA-SELTZER PLUS COLD & COUGH) 06-11-08-325 MG CAPS Take 2 tablets by mouth daily.     primidone (MYSOLINE) 250 MG tablet Take 250 mg by mouth 2 (two) times daily.     simvastatin (ZOCOR) 20 MG tablet Take 30 mg by mouth at bedtime.     tiotropium (  SPIRIVA HANDIHALER) 18 MCG inhalation capsule Place 18 mcg into inhaler and inhale daily.      traZODone (DESYREL) 100 MG tablet Take 150 mg by mouth at bedtime.     Zinc 50 MG TABS Take by mouth 1 dose over 46 hours.            Hospital Course: Patient is a 65 year old Caucasian male with history of COPD, chronic respiratory failure on home O2, still actively smokes, was admitted to the hospital on 112/28/2012 with 3 week history of progressive shortness of breath with cough. Patient was said to have been treated various times with antibiotics for upper respiratory tract infection and symptoms was said to have persisted. He was also said to have mild fever no chills no Rigors. He denied any history of chest pain. Patient was said to  be getting confused and subsequently brought to the hospital by his spouse to be evaluated. In the ED, patient was said to have had decreased breath sounds bibasally I was subsequently admitted for COPD exacerbation and acute on chronic respiratory failure. Patient was admitted to telemetry, he was placed on O2 via nonrebreather mask and this was subsequently titrated to O2 via nasal, and thereafter discontinued. He was also given breathing treatment. And empirically started on IV Zithromax as well as Rocephin. Also added to patient's regimen was IV Solu-Medrol. Patient's was restarted on his antipsychotic medication as well as antidepressant. He was evaluated on daily basis. Patient made remarkable progress. O2 via nasal, was discontinued. She was started on by mouth feeds. Patient was evaluated by me on daily basis. Lung exam showed very minimal rhonchi. I discussed patient's case in details with patient and spouse, the patient demanded to be discharge home, agreeable to be treated for flu with Tamiflu and to have home health. Vital signs are stable, plan is to be discharge home today.   Day of Discharge BP 137/84  Pulse 89  Temp(Src) 98 F (36.7 C) (Oral)  Resp 16  SpO2 93%  Physical Exam: Vitals as above HEENT-pallor Neck-no jugular venous distention Chest-clear to auscultation CVS-S1 and S2 Abdomen-soft, nontender, organs not palpable, bowel sounds are present. Extremity-no pedal edema Skin-no ecchymoses Neuro-nonfocal   Results for orders placed during the hospital encounter of 02/07/11 (from the past 24 hour(s))  CARDIAC PANEL(CRET KIN+CKTOT+MB+TROPI)     Status: Normal   Collection Time   02/09/11  6:37 PM      Component Value Range   Total CK 93  7 - 232 (U/L)   CK, MB 3.6  0.3 - 4.0 (ng/mL)   Troponin I <0.30  <0.30 (ng/mL)   Relative Index RELATIVE INDEX IS INVALID  0.0 - 2.5   CARDIAC PANEL(CRET KIN+CKTOT+MB+TROPI)     Status: Normal   Collection Time   02/10/11  1:35  AM      Component Value Range   Total CK 72  7 - 232 (U/L)   CK, MB 3.1  0.3 - 4.0 (ng/mL)   Troponin I <0.30  <0.30 (ng/mL)   Relative Index RELATIVE INDEX IS INVALID  0.0 - 2.5   CARDIAC PANEL(CRET KIN+CKTOT+MB+TROPI)     Status: Normal   Collection Time   02/10/11 10:10 AM      Component Value Range   Total CK 62  7 - 232 (U/L)   CK, MB 2.8  0.3 - 4.0 (ng/mL)   Troponin I <0.30  <0.30 (ng/mL)   Relative Index RELATIVE INDEX IS INVALID  0.0 - 2.5  Disposition: stable   Follow-up Appts: Discharge Orders    Future Appointments: Provider: Department: Dept Phone: Center:   07/25/2011 10:30 AM Barbaraann Share, MD [facility] [phone] None      Signed: Talmage Nap 02/10/2011, 1:32 PM
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
## 2011-02-13 ENCOUNTER — Other Ambulatory Visit: Payer: Self-pay | Admitting: Family Medicine

## 2011-02-13 DIAGNOSIS — F319 Bipolar disorder, unspecified: Secondary | ICD-10-CM | POA: Diagnosis not present

## 2011-02-13 DIAGNOSIS — M4800 Spinal stenosis, site unspecified: Secondary | ICD-10-CM | POA: Diagnosis not present

## 2011-02-13 DIAGNOSIS — G252 Other specified forms of tremor: Secondary | ICD-10-CM | POA: Diagnosis not present

## 2011-02-13 DIAGNOSIS — I2510 Atherosclerotic heart disease of native coronary artery without angina pectoris: Secondary | ICD-10-CM | POA: Diagnosis not present

## 2011-02-13 DIAGNOSIS — G250 Essential tremor: Secondary | ICD-10-CM | POA: Diagnosis not present

## 2011-02-13 DIAGNOSIS — J441 Chronic obstructive pulmonary disease with (acute) exacerbation: Secondary | ICD-10-CM | POA: Diagnosis not present

## 2011-02-13 MED ORDER — FLUTICASONE PROPIONATE 50 MCG/ACT NA SUSP: 2.0000 | Freq: Every day | NASAL | Status: DC

## 2011-02-13 MED ORDER — SIMVASTATIN 20 MG PO TABS: 30.0000 mg | ORAL_TABLET | Freq: Every day | ORAL | Status: DC

## 2011-02-13 NOTE — Telephone Encounter (Signed)
rx sent to pharmacy by e-script  

## 2011-02-14 LAB — CULTURE, BLOOD (ROUTINE X 2)
Culture  Setup Time: 201212291044
Culture: NO GROWTH

## 2011-02-17 DIAGNOSIS — F319 Bipolar disorder, unspecified: Secondary | ICD-10-CM | POA: Diagnosis not present

## 2011-02-17 DIAGNOSIS — M4800 Spinal stenosis, site unspecified: Secondary | ICD-10-CM | POA: Diagnosis not present

## 2011-02-17 DIAGNOSIS — J441 Chronic obstructive pulmonary disease with (acute) exacerbation: Secondary | ICD-10-CM | POA: Diagnosis not present

## 2011-02-17 DIAGNOSIS — G250 Essential tremor: Secondary | ICD-10-CM | POA: Diagnosis not present

## 2011-02-17 DIAGNOSIS — I2510 Atherosclerotic heart disease of native coronary artery without angina pectoris: Secondary | ICD-10-CM | POA: Diagnosis not present

## 2011-02-18 ENCOUNTER — Telehealth: Payer: Self-pay | Admitting: *Deleted

## 2011-02-18 DIAGNOSIS — F331 Major depressive disorder, recurrent, moderate: Secondary | ICD-10-CM | POA: Diagnosis not present

## 2011-02-18 NOTE — Telephone Encounter (Signed)
Agree w/ order for home health

## 2011-02-18 NOTE — Telephone Encounter (Signed)
Lorenda Hatchet PT from [facility] called in to advise that pt had been set up for evaluation via the hosptialist however normally the hospitalist will not verify any orders they only send out the evaluation for care, delegated verbal orders to Lorenda Hatchet from MD Beverely Low stating that pt can receive 8 weeks of PT two times weekly for building core strength and endurance for his breathing so pt can be able to go out. Verified the PT is okay for pt to receive

## 2011-02-20 DIAGNOSIS — F319 Bipolar disorder, unspecified: Secondary | ICD-10-CM | POA: Diagnosis not present

## 2011-02-20 DIAGNOSIS — G250 Essential tremor: Secondary | ICD-10-CM | POA: Diagnosis not present

## 2011-02-20 DIAGNOSIS — J441 Chronic obstructive pulmonary disease with (acute) exacerbation: Secondary | ICD-10-CM | POA: Diagnosis not present

## 2011-02-20 DIAGNOSIS — G252 Other specified forms of tremor: Secondary | ICD-10-CM | POA: Diagnosis not present

## 2011-02-20 DIAGNOSIS — I2510 Atherosclerotic heart disease of native coronary artery without angina pectoris: Secondary | ICD-10-CM | POA: Diagnosis not present

## 2011-02-20 DIAGNOSIS — M4800 Spinal stenosis, site unspecified: Secondary | ICD-10-CM | POA: Diagnosis not present

## 2011-02-25 DIAGNOSIS — J441 Chronic obstructive pulmonary disease with (acute) exacerbation: Secondary | ICD-10-CM | POA: Diagnosis not present

## 2011-02-25 DIAGNOSIS — M4800 Spinal stenosis, site unspecified: Secondary | ICD-10-CM | POA: Diagnosis not present

## 2011-02-25 DIAGNOSIS — F319 Bipolar disorder, unspecified: Secondary | ICD-10-CM | POA: Diagnosis not present

## 2011-02-25 DIAGNOSIS — I2510 Atherosclerotic heart disease of native coronary artery without angina pectoris: Secondary | ICD-10-CM | POA: Diagnosis not present

## 2011-02-25 DIAGNOSIS — G250 Essential tremor: Secondary | ICD-10-CM | POA: Diagnosis not present

## 2011-02-25 DIAGNOSIS — G252 Other specified forms of tremor: Secondary | ICD-10-CM | POA: Diagnosis not present

## 2011-02-26 ENCOUNTER — Other Ambulatory Visit: Payer: Self-pay | Admitting: Family Medicine

## 2011-02-26 NOTE — Telephone Encounter (Signed)
The pt's wife called and stated the insurance company is no longer covering Simvastatin 62m.  She called and they told her the dr could send over a request for an exception if the drug is necessary.  She asked that this be sent to [facility] - [phone]  Phone - [phone] Thanks !!

## 2011-02-26 NOTE — Telephone Encounter (Signed)
Last OV 02-07-11 for URI, was not sure if this was okay with you to send?

## 2011-02-26 NOTE — Telephone Encounter (Signed)
Advised pt to contact ins company for formulary to medications, pt wife will call back tomorrow

## 2011-02-26 NOTE — Telephone Encounter (Signed)
Pt (or wife) needs to check w/ insurance company to determine what med is now preferred on their plan.

## 2011-02-27 DIAGNOSIS — G252 Other specified forms of tremor: Secondary | ICD-10-CM | POA: Diagnosis not present

## 2011-02-27 DIAGNOSIS — M4800 Spinal stenosis, site unspecified: Secondary | ICD-10-CM | POA: Diagnosis not present

## 2011-02-27 DIAGNOSIS — J441 Chronic obstructive pulmonary disease with (acute) exacerbation: Secondary | ICD-10-CM | POA: Diagnosis not present

## 2011-02-27 DIAGNOSIS — F319 Bipolar disorder, unspecified: Secondary | ICD-10-CM | POA: Diagnosis not present

## 2011-02-27 DIAGNOSIS — I2510 Atherosclerotic heart disease of native coronary artery without angina pectoris: Secondary | ICD-10-CM | POA: Diagnosis not present

## 2011-03-04 DIAGNOSIS — J441 Chronic obstructive pulmonary disease with (acute) exacerbation: Secondary | ICD-10-CM | POA: Diagnosis not present

## 2011-03-04 DIAGNOSIS — G250 Essential tremor: Secondary | ICD-10-CM | POA: Diagnosis not present

## 2011-03-04 DIAGNOSIS — I2510 Atherosclerotic heart disease of native coronary artery without angina pectoris: Secondary | ICD-10-CM | POA: Diagnosis not present

## 2011-03-04 DIAGNOSIS — M4800 Spinal stenosis, site unspecified: Secondary | ICD-10-CM | POA: Diagnosis not present

## 2011-03-04 DIAGNOSIS — F319 Bipolar disorder, unspecified: Secondary | ICD-10-CM | POA: Diagnosis not present

## 2011-03-05 ENCOUNTER — Telehealth: Payer: Self-pay | Admitting: Family Medicine

## 2011-03-05 MED ORDER — SIMVASTATIN 20 MG PO TABS: 30.0000 mg | ORAL_TABLET | Freq: Every day | ORAL | Status: DC

## 2011-03-05 NOTE — Telephone Encounter (Signed)
rx sent to pharmacy by e-script  

## 2011-03-05 NOTE — Telephone Encounter (Signed)
Noted pt called and spoke with staff to request the simivastatin sent to [facility], sent medication per pt request.

## 2011-03-05 NOTE — Telephone Encounter (Signed)
The pt called and is requesting a refill of Simvastatin 20 mg sent to [facility].  Thanks!

## 2011-03-06 DIAGNOSIS — G250 Essential tremor: Secondary | ICD-10-CM | POA: Diagnosis not present

## 2011-03-06 DIAGNOSIS — J441 Chronic obstructive pulmonary disease with (acute) exacerbation: Secondary | ICD-10-CM | POA: Diagnosis not present

## 2011-03-06 DIAGNOSIS — M4800 Spinal stenosis, site unspecified: Secondary | ICD-10-CM | POA: Diagnosis not present

## 2011-03-06 DIAGNOSIS — I2510 Atherosclerotic heart disease of native coronary artery without angina pectoris: Secondary | ICD-10-CM | POA: Diagnosis not present

## 2011-03-06 DIAGNOSIS — F319 Bipolar disorder, unspecified: Secondary | ICD-10-CM | POA: Diagnosis not present

## 2011-03-07 ENCOUNTER — Other Ambulatory Visit: Payer: Self-pay | Admitting: *Deleted

## 2011-03-07 MED ORDER — SIMVASTATIN 40 MG PO TABS: 40.0000 mg | ORAL_TABLET | Freq: Every day | ORAL | Status: DC

## 2011-03-07 NOTE — Telephone Encounter (Signed)
Pharmacy called stated pt now wants 40mg  of Simivastatin instead of the 20mg  per phone call noted on 03-05-11 re-sent rx to pharmacy via [facility] for simivastatin 40mg  per pt request

## 2011-03-11 DIAGNOSIS — I2510 Atherosclerotic heart disease of native coronary artery without angina pectoris: Secondary | ICD-10-CM | POA: Diagnosis not present

## 2011-03-11 DIAGNOSIS — F319 Bipolar disorder, unspecified: Secondary | ICD-10-CM | POA: Diagnosis not present

## 2011-03-11 DIAGNOSIS — J441 Chronic obstructive pulmonary disease with (acute) exacerbation: Secondary | ICD-10-CM | POA: Diagnosis not present

## 2011-03-11 DIAGNOSIS — G250 Essential tremor: Secondary | ICD-10-CM | POA: Diagnosis not present

## 2011-03-11 DIAGNOSIS — M4800 Spinal stenosis, site unspecified: Secondary | ICD-10-CM | POA: Diagnosis not present

## 2011-03-13 DIAGNOSIS — J441 Chronic obstructive pulmonary disease with (acute) exacerbation: Secondary | ICD-10-CM | POA: Diagnosis not present

## 2011-03-13 DIAGNOSIS — M4800 Spinal stenosis, site unspecified: Secondary | ICD-10-CM | POA: Diagnosis not present

## 2011-03-13 DIAGNOSIS — F319 Bipolar disorder, unspecified: Secondary | ICD-10-CM | POA: Diagnosis not present

## 2011-03-13 DIAGNOSIS — I2510 Atherosclerotic heart disease of native coronary artery without angina pectoris: Secondary | ICD-10-CM | POA: Diagnosis not present

## 2011-03-13 DIAGNOSIS — G250 Essential tremor: Secondary | ICD-10-CM | POA: Diagnosis not present

## 2011-03-14 NOTE — ED Provider Notes (Signed)
Medical screening examination/treatment/procedure(s) were performed by non-physician practitioner and as supervising physician I was immediately available for consultation/collaboration.  Raeford Razor, MD 03/14/11 [phone]

## 2011-03-18 DIAGNOSIS — F319 Bipolar disorder, unspecified: Secondary | ICD-10-CM | POA: Diagnosis not present

## 2011-03-18 DIAGNOSIS — M4800 Spinal stenosis, site unspecified: Secondary | ICD-10-CM | POA: Diagnosis not present

## 2011-03-18 DIAGNOSIS — J441 Chronic obstructive pulmonary disease with (acute) exacerbation: Secondary | ICD-10-CM | POA: Diagnosis not present

## 2011-03-18 DIAGNOSIS — I2510 Atherosclerotic heart disease of native coronary artery without angina pectoris: Secondary | ICD-10-CM | POA: Diagnosis not present

## 2011-03-18 DIAGNOSIS — G252 Other specified forms of tremor: Secondary | ICD-10-CM | POA: Diagnosis not present

## 2011-03-20 DIAGNOSIS — F319 Bipolar disorder, unspecified: Secondary | ICD-10-CM | POA: Diagnosis not present

## 2011-03-20 DIAGNOSIS — J441 Chronic obstructive pulmonary disease with (acute) exacerbation: Secondary | ICD-10-CM | POA: Diagnosis not present

## 2011-03-20 DIAGNOSIS — G250 Essential tremor: Secondary | ICD-10-CM | POA: Diagnosis not present

## 2011-03-20 DIAGNOSIS — M4800 Spinal stenosis, site unspecified: Secondary | ICD-10-CM | POA: Diagnosis not present

## 2011-03-20 DIAGNOSIS — I2510 Atherosclerotic heart disease of native coronary artery without angina pectoris: Secondary | ICD-10-CM | POA: Diagnosis not present

## 2011-03-25 DIAGNOSIS — I2510 Atherosclerotic heart disease of native coronary artery without angina pectoris: Secondary | ICD-10-CM | POA: Diagnosis not present

## 2011-03-25 DIAGNOSIS — M4800 Spinal stenosis, site unspecified: Secondary | ICD-10-CM | POA: Diagnosis not present

## 2011-03-25 DIAGNOSIS — J441 Chronic obstructive pulmonary disease with (acute) exacerbation: Secondary | ICD-10-CM | POA: Diagnosis not present

## 2011-03-25 DIAGNOSIS — F319 Bipolar disorder, unspecified: Secondary | ICD-10-CM | POA: Diagnosis not present

## 2011-03-25 DIAGNOSIS — G252 Other specified forms of tremor: Secondary | ICD-10-CM | POA: Diagnosis not present

## 2011-03-27 DIAGNOSIS — J441 Chronic obstructive pulmonary disease with (acute) exacerbation: Secondary | ICD-10-CM | POA: Diagnosis not present

## 2011-03-27 DIAGNOSIS — G250 Essential tremor: Secondary | ICD-10-CM | POA: Diagnosis not present

## 2011-03-27 DIAGNOSIS — F319 Bipolar disorder, unspecified: Secondary | ICD-10-CM | POA: Diagnosis not present

## 2011-03-27 DIAGNOSIS — M4800 Spinal stenosis, site unspecified: Secondary | ICD-10-CM | POA: Diagnosis not present

## 2011-03-27 DIAGNOSIS — I2510 Atherosclerotic heart disease of native coronary artery without angina pectoris: Secondary | ICD-10-CM | POA: Diagnosis not present

## 2011-04-01 ENCOUNTER — Ambulatory Visit: Payer: Medicare Other | Admitting: Family Medicine

## 2011-04-01 DIAGNOSIS — F319 Bipolar disorder, unspecified: Secondary | ICD-10-CM | POA: Diagnosis not present

## 2011-04-01 DIAGNOSIS — G250 Essential tremor: Secondary | ICD-10-CM | POA: Diagnosis not present

## 2011-04-01 DIAGNOSIS — I2510 Atherosclerotic heart disease of native coronary artery without angina pectoris: Secondary | ICD-10-CM | POA: Diagnosis not present

## 2011-04-01 DIAGNOSIS — J441 Chronic obstructive pulmonary disease with (acute) exacerbation: Secondary | ICD-10-CM | POA: Diagnosis not present

## 2011-04-01 DIAGNOSIS — M4800 Spinal stenosis, site unspecified: Secondary | ICD-10-CM | POA: Diagnosis not present

## 2011-04-02 DIAGNOSIS — M4800 Spinal stenosis, site unspecified: Secondary | ICD-10-CM | POA: Diagnosis not present

## 2011-04-02 DIAGNOSIS — G250 Essential tremor: Secondary | ICD-10-CM | POA: Diagnosis not present

## 2011-04-02 DIAGNOSIS — F319 Bipolar disorder, unspecified: Secondary | ICD-10-CM | POA: Diagnosis not present

## 2011-04-02 DIAGNOSIS — J441 Chronic obstructive pulmonary disease with (acute) exacerbation: Secondary | ICD-10-CM | POA: Diagnosis not present

## 2011-04-02 DIAGNOSIS — I2510 Atherosclerotic heart disease of native coronary artery without angina pectoris: Secondary | ICD-10-CM | POA: Diagnosis not present

## 2011-04-03 DIAGNOSIS — M4800 Spinal stenosis, site unspecified: Secondary | ICD-10-CM | POA: Diagnosis not present

## 2011-04-03 DIAGNOSIS — F319 Bipolar disorder, unspecified: Secondary | ICD-10-CM | POA: Diagnosis not present

## 2011-04-03 DIAGNOSIS — I2510 Atherosclerotic heart disease of native coronary artery without angina pectoris: Secondary | ICD-10-CM | POA: Diagnosis not present

## 2011-04-03 DIAGNOSIS — J441 Chronic obstructive pulmonary disease with (acute) exacerbation: Secondary | ICD-10-CM | POA: Diagnosis not present

## 2011-04-03 DIAGNOSIS — G250 Essential tremor: Secondary | ICD-10-CM | POA: Diagnosis not present

## 2011-04-04 DIAGNOSIS — J441 Chronic obstructive pulmonary disease with (acute) exacerbation: Secondary | ICD-10-CM | POA: Diagnosis not present

## 2011-04-04 DIAGNOSIS — I2510 Atherosclerotic heart disease of native coronary artery without angina pectoris: Secondary | ICD-10-CM | POA: Diagnosis not present

## 2011-04-04 DIAGNOSIS — M4800 Spinal stenosis, site unspecified: Secondary | ICD-10-CM | POA: Diagnosis not present

## 2011-04-04 DIAGNOSIS — G252 Other specified forms of tremor: Secondary | ICD-10-CM | POA: Diagnosis not present

## 2011-04-04 DIAGNOSIS — F319 Bipolar disorder, unspecified: Secondary | ICD-10-CM | POA: Diagnosis not present

## 2011-04-08 DIAGNOSIS — I2510 Atherosclerotic heart disease of native coronary artery without angina pectoris: Secondary | ICD-10-CM | POA: Diagnosis not present

## 2011-04-08 DIAGNOSIS — M4800 Spinal stenosis, site unspecified: Secondary | ICD-10-CM | POA: Diagnosis not present

## 2011-04-08 DIAGNOSIS — G252 Other specified forms of tremor: Secondary | ICD-10-CM | POA: Diagnosis not present

## 2011-04-08 DIAGNOSIS — J441 Chronic obstructive pulmonary disease with (acute) exacerbation: Secondary | ICD-10-CM | POA: Diagnosis not present

## 2011-04-08 DIAGNOSIS — F319 Bipolar disorder, unspecified: Secondary | ICD-10-CM | POA: Diagnosis not present

## 2011-04-10 DIAGNOSIS — G252 Other specified forms of tremor: Secondary | ICD-10-CM | POA: Diagnosis not present

## 2011-04-10 DIAGNOSIS — F319 Bipolar disorder, unspecified: Secondary | ICD-10-CM | POA: Diagnosis not present

## 2011-04-10 DIAGNOSIS — I2510 Atherosclerotic heart disease of native coronary artery without angina pectoris: Secondary | ICD-10-CM | POA: Diagnosis not present

## 2011-04-10 DIAGNOSIS — M4800 Spinal stenosis, site unspecified: Secondary | ICD-10-CM | POA: Diagnosis not present

## 2011-04-10 DIAGNOSIS — J441 Chronic obstructive pulmonary disease with (acute) exacerbation: Secondary | ICD-10-CM | POA: Diagnosis not present

## 2011-04-15 ENCOUNTER — Telehealth: Payer: Self-pay | Admitting: Family Medicine

## 2011-04-15 NOTE — Telephone Encounter (Signed)
rx refill for   metoprolol (TOPROL-XL) 100 MG TER # 90  Take 1-tablet by mouth once daily

## 2011-04-17 MED ORDER — METOPROLOL SUCCINATE ER 100 MG PO TB24: 100.0000 mg | ORAL_TABLET | Freq: Every day | ORAL | Status: DC

## 2011-04-17 NOTE — Telephone Encounter (Signed)
rx sent to pharmacy by e-script  

## 2011-04-22 ENCOUNTER — Telehealth: Payer: Self-pay | Admitting: Family Medicine

## 2011-04-22 NOTE — Telephone Encounter (Signed)
Refill: Primidone 250mg  tab #180. Take 1 tablet by mouth twice daily. Last fill 10-25-10

## 2011-04-23 ENCOUNTER — Telehealth: Payer: Self-pay | Admitting: Family Medicine

## 2011-04-23 DIAGNOSIS — I639 Cerebral infarction, unspecified: Secondary | ICD-10-CM

## 2011-04-23 DIAGNOSIS — R251 Tremor, unspecified: Secondary | ICD-10-CM

## 2011-04-23 MED ORDER — PRIMIDONE 250 MG PO TABS: 250.0000 mg | ORAL_TABLET | Freq: Two times a day (BID) | ORAL | Status: DC

## 2011-04-23 NOTE — Telephone Encounter (Signed)
Patient would like a referral to a neurologist.  Malik Zuniga, states that during the 10/16/10 visit you had mentioned referring them to a neurologist & they are now ready to start that. Can you do a referral or do they need to be seen again? Please review & Advise  I will gladly call them back

## 2011-04-23 NOTE — Telephone Encounter (Signed)
Sent referral for neurology, pt will await a call about upcoming apt

## 2011-04-23 NOTE — Telephone Encounter (Signed)
rx sent to pharmacy by e-script  

## 2011-04-23 NOTE — Telephone Encounter (Signed)
Please advise 

## 2011-04-23 NOTE — Telephone Encounter (Signed)
Ok for neuro- he needs this!

## 2011-04-29 ENCOUNTER — Telehealth: Payer: Self-pay | Admitting: Family Medicine

## 2011-04-29 MED ORDER — ONDANSETRON HCL 8 MG PO TABS: 8.0000 mg | ORAL_TABLET | Freq: Three times a day (TID) | ORAL | Status: DC | PRN

## 2011-04-29 NOTE — Telephone Encounter (Signed)
Refill for   Ondansetron ODT (Tab) 8 MG  Qty not listed   Take 8 mg by mouth every 8 (eight) hours as needed

## 2011-04-29 NOTE — Telephone Encounter (Deleted)
Refill for  Ondansetron ODT(Tab) 8 MG #20 No qty or last fill date listed Take 8 mg by mouth every 8 (eight) hours as needed. For nausea

## 2011-04-29 NOTE — Telephone Encounter (Signed)
I spoke with patient's wife, Nicholos Johns, patient is scheduled to see Dr. Beverely Low on Monday, 05-05-11, so that we have recent office notes regarding the patients stroke and tremor, being required by Neurology.

## 2011-04-29 NOTE — Telephone Encounter (Signed)
Patient's wife called stating the patient is supposed to be referred to Dr. Sandria Manly and would like to know the status of the referral.

## 2011-04-29 NOTE — Telephone Encounter (Signed)
I left message on machine for spouse, Nicholos Johns, to return my call.  Per call from Diane at [facility] Neurologic/Dr. Love's office, recent office notes referencing the current diagnosis, are REQUIRED prior to patient being offered an appointment, regardless if they are already an established patient of Dr. Sandria Manly.

## 2011-05-01 ENCOUNTER — Telehealth: Payer: Self-pay | Admitting: Family Medicine

## 2011-05-01 NOTE — Telephone Encounter (Signed)
Refill: Nitrostat 0.4mg  sl tab #100. Dissolve 1 tablet under the tongue. May repeat every 5 minutes, max of 3 doses in 15 minutes.

## 2011-05-01 NOTE — Telephone Encounter (Signed)
Called pt to verify where he usually gets his nitrostat per not listed on his medications for this office/MD Tabori, pt advised that the pharmacy must have sent the medication refill to the wrong MD, pt verified that he is not out of the medication currently he has several left, advised that I will call pharmacy to advise the request needs to be sent to [name] his cardiologist, pt understood, called [facility] to advise [facility] (pharmacy tech) that the medication for Nitrostat should have been sent to cardiologist, pharmacy tech advised that he was unable to locate the fax that was sent at this time but that he will located and re-send to correct PCP [name] cardiologist

## 2011-05-05 ENCOUNTER — Encounter: Payer: Self-pay | Admitting: Family Medicine

## 2011-05-05 ENCOUNTER — Ambulatory Visit (INDEPENDENT_AMBULATORY_CARE_PROVIDER_SITE_OTHER): Payer: Medicare Other | Admitting: Family Medicine

## 2011-05-05 VITALS — BP 138/85 | HR 91 | Temp 97.5°F | Ht 70.0 in | Wt 181.8 lb

## 2011-05-05 DIAGNOSIS — R259 Unspecified abnormal involuntary movements: Secondary | ICD-10-CM | POA: Diagnosis not present

## 2011-05-05 DIAGNOSIS — I6789 Other cerebrovascular disease: Secondary | ICD-10-CM | POA: Diagnosis not present

## 2011-05-05 DIAGNOSIS — I10 Essential (primary) hypertension: Secondary | ICD-10-CM

## 2011-05-05 MED ORDER — PRIMIDONE 250 MG PO TABS: 250.0000 mg | ORAL_TABLET | Freq: Three times a day (TID) | ORAL | Status: DC

## 2011-05-05 MED ORDER — TIOTROPIUM BROMIDE MONOHYDRATE 18 MCG IN CAPS: 18.0000 ug | ORAL_CAPSULE | Freq: Every day | RESPIRATORY_TRACT | Status: DC

## 2011-05-05 MED ORDER — NITROGLYCERIN 0.4 MG SL SUBL: 0.4000 mg | SUBLINGUAL_TABLET | SUBLINGUAL | Status: DC | PRN

## 2011-05-05 MED ORDER — METOPROLOL SUCCINATE ER 100 MG PO TB24: 100.0000 mg | ORAL_TABLET | Freq: Every day | ORAL | Status: DC

## 2011-05-05 MED ORDER — AMLODIPINE BESYLATE 5 MG PO TABS: 5.0000 mg | ORAL_TABLET | Freq: Every day | ORAL | Status: DC

## 2011-05-05 NOTE — Patient Instructions (Signed)
Follow up in 1 month to recheck BP Start the Norvasc in addition to the Metoprolol Increase the Mysoline to 3x/day Someone will call you with your neuro appt Call with any questions or concerns Hang in there!

## 2011-05-05 NOTE — Progress Notes (Signed)
  Subjective:    Patient ID: Malik Zuniga, male    DOB: 1945-12-27, 66 y.o.   MRN: [id number]  HPI CVA- was following w/ [name], has not been seen in >3 yrs.  Needs new referral.  Also has ongoing tremor.  Reports this is worsening.  Is having days that 'are just gone'- doesn't remember what happened on those days.  For example- doesn't remember coming for appt and being admitted to hospital.  HTN- pt and wife report that he has had elevated BPs at home, 175/85.  Denies CP, edema.  Lisinopril was stopped due to angioedema.  S/p MI- was taking Nitrostat PRN.  Needs new script, apparently cards ([name]) won't fill script.   Review of Systems For ROS see HPI     Objective:   Physical Exam  Vitals reviewed. Constitutional: He is oriented to person, place, and time. He appears well-developed and well-nourished. No distress.       Best pt has appeared in quite some time  HENT:  Head: Normocephalic and atraumatic.  Neck: Neck supple.  Cardiovascular: Normal rate, regular rhythm and normal heart sounds.   Pulmonary/Chest: Effort normal and breath sounds normal. No respiratory distress. He has no wheezes. He has no rales.  Neurological: He is alert and oriented to person, place, and time.       Obvious tremor Ambulates w/ cane  Skin: Skin is warm and dry.          Assessment & Plan:

## 2011-05-06 NOTE — Assessment & Plan Note (Signed)
Pt has not seen neuro for this in many years.  Requires new referral.  Referral sent.

## 2011-05-06 NOTE — Assessment & Plan Note (Signed)
Deteriorated.  Pt would like to increase dose of mysoline.  Increased frequency to TID while awaiting neuro appt.

## 2011-05-06 NOTE — Assessment & Plan Note (Signed)
Deteriorated.  Pt had angioedema w/ ACE.  Add Norvasc to metoprolol.  Follow closely.

## 2011-05-22 ENCOUNTER — Emergency Department (HOSPITAL_COMMUNITY): Payer: Medicare Other

## 2011-05-22 ENCOUNTER — Telehealth: Payer: Self-pay | Admitting: Family Medicine

## 2011-05-22 ENCOUNTER — Encounter (HOSPITAL_COMMUNITY): Payer: Self-pay

## 2011-05-22 ENCOUNTER — Emergency Department (HOSPITAL_COMMUNITY)
Admission: EM | Admit: 2011-05-22 | Discharge: 2011-05-22 | Disposition: A | Discharge status: Home / Self Care | Payer: Medicare Other | Attending: Emergency Medicine | Admitting: Emergency Medicine

## 2011-05-22 DIAGNOSIS — I252 Old myocardial infarction: Secondary | ICD-10-CM | POA: Diagnosis not present

## 2011-05-22 DIAGNOSIS — R5383 Other fatigue: Secondary | ICD-10-CM | POA: Diagnosis not present

## 2011-05-22 DIAGNOSIS — S0990XA Unspecified injury of head, initial encounter: Secondary | ICD-10-CM | POA: Diagnosis not present

## 2011-05-22 DIAGNOSIS — Z8673 Personal history of transient ischemic attack (TIA), and cerebral infarction without residual deficits: Secondary | ICD-10-CM | POA: Insufficient documentation

## 2011-05-22 DIAGNOSIS — R29898 Other symptoms and signs involving the musculoskeletal system: Secondary | ICD-10-CM | POA: Diagnosis not present

## 2011-05-22 DIAGNOSIS — I10 Essential (primary) hypertension: Secondary | ICD-10-CM | POA: Insufficient documentation

## 2011-05-22 DIAGNOSIS — R5381 Other malaise: Secondary | ICD-10-CM | POA: Diagnosis not present

## 2011-05-22 DIAGNOSIS — R059 Cough, unspecified: Secondary | ICD-10-CM | POA: Insufficient documentation

## 2011-05-22 DIAGNOSIS — R4182 Altered mental status, unspecified: Secondary | ICD-10-CM | POA: Insufficient documentation

## 2011-05-22 DIAGNOSIS — J3489 Other specified disorders of nose and nasal sinuses: Secondary | ICD-10-CM | POA: Diagnosis not present

## 2011-05-22 DIAGNOSIS — R05 Cough: Secondary | ICD-10-CM | POA: Diagnosis not present

## 2011-05-22 DIAGNOSIS — R269 Unspecified abnormalities of gait and mobility: Secondary | ICD-10-CM | POA: Insufficient documentation

## 2011-05-22 DIAGNOSIS — G319 Degenerative disease of nervous system, unspecified: Secondary | ICD-10-CM | POA: Diagnosis not present

## 2011-05-22 DIAGNOSIS — R531 Weakness: Secondary | ICD-10-CM

## 2011-05-22 DIAGNOSIS — R0989 Other specified symptoms and signs involving the circulatory and respiratory systems: Secondary | ICD-10-CM | POA: Diagnosis not present

## 2011-05-22 DIAGNOSIS — J9819 Other pulmonary collapse: Secondary | ICD-10-CM | POA: Diagnosis not present

## 2011-05-22 LAB — DIFFERENTIAL
Eosinophils Relative: 1 % (ref 0–5)
Lymphocytes Relative: 42 % (ref 12–46)
Lymphs Abs: 2.7 10*3/uL (ref 0.7–4.0)
Monocytes Absolute: 0.5 10*3/uL (ref 0.1–1.0)
Neutro Abs: 3.2 10*3/uL (ref 1.7–7.7)

## 2011-05-22 LAB — BASIC METABOLIC PANEL
CO2: 28 mEq/L (ref 19–32)
Calcium: 9.4 mg/dL (ref 8.4–10.5)
Chloride: 101 mEq/L (ref 96–112)
Creatinine, Ser: 0.68 mg/dL (ref 0.50–1.35)
Glucose, Bld: 96 mg/dL (ref 70–99)

## 2011-05-22 LAB — PHENOBARBITAL LEVEL: Phenobarbital: 24.1 ug/mL (ref 15.0–40.0)

## 2011-05-22 LAB — URINALYSIS, ROUTINE W REFLEX MICROSCOPIC
Glucose, UA: NEGATIVE mg/dL
Hgb urine dipstick: NEGATIVE
Leukocytes, UA: NEGATIVE
Protein, ur: NEGATIVE mg/dL
pH: 6 (ref 5.0–8.0)

## 2011-05-22 LAB — CBC
HCT: 39.7 % (ref 39.0–52.0)
Hemoglobin: 13.5 g/dL (ref 13.0–17.0)
MCV: 91.1 fL (ref 78.0–100.0)
RBC: 4.36 MIL/uL (ref 4.22–5.81)
WBC: 6.4 10*3/uL (ref 4.0–10.5)

## 2011-05-22 MED ORDER — ALBUTEROL SULFATE (5 MG/ML) 0.5% IN NEBU
5.0000 mg | INHALATION_SOLUTION | Freq: Once | RESPIRATORY_TRACT | Status: AC
  Administered 2011-05-22: 5 mg via RESPIRATORY_TRACT
  Filled 2011-05-22: qty 1

## 2011-05-22 MED ORDER — OXYCODONE-ACETAMINOPHEN 5-325 MG PO TABS
2.0000 | ORAL_TABLET | Freq: Once | ORAL | Status: AC
  Administered 2011-05-22: 2 via ORAL
  Filled 2011-05-22: qty 2

## 2011-05-22 MED ORDER — SODIUM CHLORIDE 0.9 % IV BOLUS (SEPSIS): 500.0000 mL | Freq: Once | INTRAVENOUS | Status: DC

## 2011-05-22 NOTE — ED Provider Notes (Signed)
History     CSN: [id number]  Arrival date & time 05/22/11  1249   First MD Initiated Contact with Patient 05/22/11 1307      Chief Complaint  Patient presents with  . bilateral leg weakness     (Consider location/radiation/quality/duration/timing/severity/associated sxs/prior treatment) Patient is a 66 y.o. male presenting with extremity weakness. The history is provided by the patient and the spouse. No language interpreter was used.  Extremity Weakness This is a new problem. The current episode started yesterday. The problem occurs constantly. The problem has been gradually worsening. Associated symptoms include congestion, coughing and weakness. Pertinent negatives include no abdominal pain, chest pain, diaphoresis, fatigue, fever, headaches, nausea, neck pain, numbness, vertigo or vomiting. The symptoms are aggravated by walking. He has tried nothing for the symptoms.   Wife reports she has had increased weakness  in the last 2 days with weakness in his bilateral lower extremities with falls.  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 He also has a pmh of cerebral aneurysm clipping with craniotomy.  Denies headache. he is  Not on coumadin.  Wife also states that he started back smoking recently.  Patient is on multiple meds and recently started amodipine and was wondering if that is what is causing the weakness. Chronic productive  cough.  Admitted in January 2013 for pneumonia.  Denies fever, nausea, vomiting, hemiparesis or stroke like symptoms.  PCP is Dr.Tobori with [facility]. Past Medical History  Diagnosis Date  . Myocardial infarction   . Hyperlipidemia   . Hypertension   . Depression   . Emphysema of lung   . Tremor   . Headache, chronic daily   . History of prostate cancer   . Cancer 2004    prostate  . On home O2   . Aspiration pneumonia   . Stroke     "mini strokes"; denies residual  . Emphysema   . Neuromuscular disorder 1998   right carpal tunnel release    Past Surgical History  Procedure Date  . Cholecystectomy 1996  . 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 Jordan Likes  . Vascular stent 2000    Dr. Alanda Amass  . Hand surgery 1989    crushed thumb, Dr. Merlyn Lot  . Ulnar nerve repair 1998    left arm, Dr. Merlyn Lot  . Gastrostomy w/ feeding tube 2008    Dr. Ewing Schlein  . 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 Prostatectomy 04/2001    removal of prostate cancer, Dr. Brunilda Payor  . Back surgery 08/2004; 02/2005; 04/2006; 06/2007; 7/20101    all by Dr. Jordan Likes  . Coronary angioplasty with stent placement 04/1998  . Carpal tunnel release 1998    right    Family History  Problem Relation Age of Onset  . Heart disease Mother   . Cancer Father     brain cancer and prostate cancer     History  Substance Use Topics  . Smoking status: Former Smoker -- 1.0 packs/day for 50 years    Types: Cigarettes    Quit date: 12/15/2010  . Smokeless tobacco: Never Used  . Alcohol Use: No      Review of Systems  Constitutional: Negative for fever, diaphoresis and fatigue.  HENT: Positive for congestion. Negative for trouble swallowing, neck pain and neck stiffness.        Swallow study in [place] ok  Respiratory: Positive for cough.   Cardiovascular: Negative for chest pain.  Gastrointestinal: Negative for nausea, vomiting, abdominal pain and diarrhea.  Genitourinary: Negative for dysuria, urgency, hematuria and testicular pain.  Musculoskeletal: Positive for gait problem and extremity weakness. Negative for back pain.  Neurological: Positive for weakness. Negative for dizziness, vertigo, facial asymmetry, speech difficulty, numbness and headaches.  Psychiatric/Behavioral: Negative.  Negative for confusion.    Allergies  Lisinopril and Lamictal  Home Medications   Current Outpatient Rx  Name Route Sig Dispense Refill  . AMLODIPINE BESYLATE 5 MG PO TABS Oral Take 1 tablet (5 mg total) by mouth daily. 90 tablet 3  . VITAMIN C 500 MG  PO TABS Oral Take 500 mg by mouth daily.      . ASPIRIN 81 MG PO TBEC Oral Take 81 mg by mouth daily.      . BUDESONIDE-FORMOTEROL FUMARATE 160-4.5 MCG/ACT IN AERO Inhalation Inhale 2 puffs into the lungs 2 (two) times daily. 1 Inhaler 3  . DIAZEPAM 5 MG PO TABS Oral Take 5 mg by mouth every 6 (six) hours as needed. For anxiety     . DIVALPROEX SODIUM ER 500 MG PO TB24 Oral Take 1,000 mg by mouth at bedtime.     . DULOXETINE HCL 30 MG PO CPEP Oral Take 60 mg by mouth daily.     Marland Kitchen FLUTICASONE PROPIONATE 50 MCG/ACT NA SUSP Nasal Place 2 sprays into the nose daily. 16 g 3  . OMEPRAZOLE 40 MG PO CPDR Oral Take 40 mg by mouth daily. Take two times a day     . ONDANSETRON HCL 8 MG PO TABS Oral Take 1 tablet (8 mg total) by mouth every 8 (eight) hours as needed. For nausea 20 tablet 0  . OXYCODONE-ACETAMINOPHEN 5-325 MG PO TABS Oral Take 1 tablet by mouth every 6 (six) hours as needed.      Marland Kitchen PAROXETINE HCL 20 MG PO TABS Oral Take 60 mg by mouth daily.     Marland Kitchen PHENYLEPH-CPM-DM-APAP 06-11-08-325 MG PO CAPS Oral Take 2 tablets by mouth daily.     Marland Kitchen PRIMIDONE 250 MG PO TABS Oral Take 1 tablet (250 mg total) by mouth 3 (three) times daily. 90 tablet 3  . SIMVASTATIN 20 MG PO TABS Oral Take 40 mg by mouth at bedtime.    Marland Kitchen SIMVASTATIN 40 MG PO TABS Oral Take 1 tablet (40 mg total) by mouth at bedtime. 90 tablet 3  . TIOTROPIUM BROMIDE MONOHYDRATE 18 MCG IN CAPS Inhalation Place 1 capsule (18 mcg total) into inhaler and inhale daily. 30 capsule 6  . TRAZODONE HCL 100 MG PO TABS Oral Take 150 mg by mouth at bedtime.     Marland Kitchen ZINC 50 MG PO TABS Oral Take by mouth 1 dose over 46 hours.        BP 119/84  Pulse 96  Temp(Src) 98.4 F (36.9 C) (Oral)  Resp 16  SpO2 97%  Physical Exam  Nursing note and vitals reviewed. Constitutional: He is oriented to person, place, and time. He appears well-developed and well-nourished.  HENT:  Head: Normocephalic.  Eyes: Conjunctivae and EOM are normal. Pupils are equal,  round, and reactive to light.  Neck: Normal range of motion. Neck supple.  Cardiovascular: Normal rate.   Pulmonary/Chest: Effort normal.  Abdominal: Soft.  Musculoskeletal: He exhibits no edema and no tenderness.       Sitting hunched over in the bed which is baseline.  Neurological: He is alert and oriented to person, place, and time. He displays atrophy. No cranial nerve deficit or  sensory deficit. Gait abnormal. GCS eye subscore is 4. GCS verbal subscore is 5. GCS motor subscore is 6.       Normal speech, poor coordination (old per wife)  Skin: Skin is warm and dry.  Psychiatric: He has a normal mood and affect.    ED Course  Procedures (including critical care time)   Labs Reviewed  CBC  DIFFERENTIAL  BASIC METABOLIC PANEL  URINALYSIS, ROUTINE W REFLEX MICROSCOPIC   No results found.   No diagnosis found.    MDM  1615 Report given ot Bridgett PA .  Will dispo after chest x-ry results.  Patient states he would like to go home.  Labs and CT head unremarkable.    Labs Reviewed  URINALYSIS, ROUTINE W REFLEX MICROSCOPIC - Abnormal; Notable for the following:    Color, Urine AMBER (*) BIOCHEMICALS MAY BE AFFECTED BY COLOR   Specific Gravity, Urine 1.039 (*)    Ketones, ur 15 (*)    All other components within normal limits  CBC  DIFFERENTIAL  BASIC METABOLIC PANEL  POCT I-STAT TROPONIN I        Date: 05/22/2011  Rate:93   Rhythm: normal sinus rhythm  QRS Axis: normal  Intervals: normal  ST/T Wave abnormalities: normal  Conduction Disutrbances:none  Narrative Interpretation:   Old EKG Reviewed: unchanged    Remi Haggard, NP 05/22/11 1742  Remi Haggard, NP 05/23/11 [phone]

## 2011-05-22 NOTE — Telephone Encounter (Signed)
Spoke to pt wife Olegario Messier and advised instructions per MD Beverely Low, pt wife stated that she does not think she will be able to get him out to the car, she stated pt is asleep and that she will pass on the instructions to the pt, I advised for her to contact EMS again and let them know you spoke with his MD and per her current sxs and Neuro History that he needs to be evaluated in the ER, pt wife understood and will call EMS today as strongly advised. MD Beverely Low made aware verbally

## 2011-05-22 NOTE — ED Provider Notes (Signed)
6:18 PM Patient signed out to me by Remi Haggard NP and Dr. [name] Filbert. Chest x-ray was normal and the spouse is concerned medications the because of his weakness. States he is unable to keep his eyes open for more than 36 point. Reports his normal as walking around the house. Spouse has concern that primidone is the cause since this was recently increased. Will Order Phenobarbital level.   7:37 PM Phenobarbital level is normal. Advised patient to spouse that potentially his symptoms may be due to increase in Primidone mediction. Spouse states she will followup with his primary care provider tomorrow and discussed decreasing dose back down to 250mg . Patient agrees and is ready for discharge   Results for orders placed during the hospital encounter of 05/22/11  CBC      Component Value Range   WBC 6.4  4.0 - 10.5 (K/uL)   RBC 4.36  4.22 - 5.81 (MIL/uL)   Hemoglobin 13.5  13.0 - 17.0 (g/dL)   HCT 40.9  81.1 - 91.4 (%)   MCV 91.1  78.0 - 100.0 (fL)   MCH 31.0  26.0 - 34.0 (pg)   MCHC 34.0  30.0 - 36.0 (g/dL)   RDW 78.2  95.6 - 21.3 (%)   Platelets 270  150 - 400 (K/uL)  DIFFERENTIAL      Component Value Range   Neutrophils Relative 49  43 - 77 (%)   Neutro Abs 3.2  1.7 - 7.7 (K/uL)   Lymphocytes Relative 42  12 - 46 (%)   Lymphs Abs 2.7  0.7 - 4.0 (K/uL)   Monocytes Relative 7  3 - 12 (%)   Monocytes Absolute 0.5  0.1 - 1.0 (K/uL)   Eosinophils Relative 1  0 - 5 (%)   Eosinophils Absolute 0.1  0.0 - 0.7 (K/uL)   Basophils Relative 1  0 - 1 (%)   Basophils Absolute 0.0  0.0 - 0.1 (K/uL)  BASIC METABOLIC PANEL      Component Value Range   Sodium 139  135 - 145 (mEq/L)   Potassium 4.5  3.5 - 5.1 (mEq/L)   Chloride 101  96 - 112 (mEq/L)   CO2 28  19 - 32 (mEq/L)   Glucose, Bld 96  70 - 99 (mg/dL)   BUN 14  6 - 23 (mg/dL)   Creatinine, Ser 0.86  0.50 - 1.35 (mg/dL)   Calcium 9.4  8.4 - 57.8 (mg/dL)   GFR calc non Af Amer >90  >90 (mL/min)   GFR calc Af Amer >90  >90 (mL/min)    URINALYSIS, ROUTINE W REFLEX MICROSCOPIC      Component Value Range   Color, Urine AMBER (*) YELLOW    APPearance CLEAR  CLEAR    Specific Gravity, Urine 1.039 (*) 1.005 - 1.030    pH 6.0  5.0 - 8.0    Glucose, UA NEGATIVE  NEGATIVE (mg/dL)   Hgb urine dipstick NEGATIVE  NEGATIVE    Bilirubin Urine NEGATIVE  NEGATIVE    Ketones, ur 15 (*) NEGATIVE (mg/dL)   Protein, ur NEGATIVE  NEGATIVE (mg/dL)   Urobilinogen, UA 1.0  0.0 - 1.0 (mg/dL)   Nitrite NEGATIVE  NEGATIVE    Leukocytes, UA NEGATIVE  NEGATIVE   POCT I-STAT TROPONIN I      Component Value Range   Troponin i, poc 0.00  0.00 - 0.08 (ng/mL)   Comment 3           PHENOBARBITAL LEVEL  Component Value Range   Phenobarbital 24.1  15.0 - 40.0 (ug/mL)   Ct Head Wo Contrast  05/22/2011  *RADIOLOGY REPORT*  Clinical Data: Bilateral leg weakness.  History of fall with head trauma.  CT HEAD WITHOUT CONTRAST  Technique:  Contiguous axial images were obtained from the base of the skull through the vertex without contrast.  Comparison: MRI of the brain 09/17/2005.  Findings: Study is slightly limited by atypical positioning of the patient.  Allowing for these limitations, there does not appear to be an acute displaced skull fracture.  Postoperative changes of the left pterional craniotomy are noted, and an aneurysm clip is in place in the left MCA distribution.  No definite acute intracranial abnormalities.  Specifically, no signs of acute post-traumatic intracranial hemorrhage, no focal mass, mass effect, hydrocephalus or abnormal intra or extra-axial fluid collections.  Mild cerebral and cerebellar atrophy.  Visualized paranasal sinuses and mastoids are generally well pneumatized, with exception of a small amount mucosal thickening and a small polypoid density in the left maxillary sinus.  IMPRESSION: 1.  No acute displaced skull fracture or evidence of acute intracranial abnormality. 2.  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 3.  Mild cerebral and cerebellar atrophy.  Original Report Authenticated By: Florencia Reasons, M.D.   Dg Chest Portable 1 View  05/22/2011  *RADIOLOGY REPORT*  Clinical Data: Cough.  Altered mental status.  PORTABLE CHEST - 1 VIEW  Comparison: 02/07/2011.  Findings: Low lung volumes.  Basilar atelectasis. Cardiopericardial silhouette appears within normal limits.  No airspace disease.  No effusion. Monitoring leads are projected over the chest.  IMPRESSION: The chest with bilateral basilar atelectasis.  No gross consolidation.  Original Report Authenticated By: Andreas Newport, M.D.      Thomasene Lot, PA-C 05/22/11 1938

## 2011-05-22 NOTE — ED Provider Notes (Signed)
Medical screening examination/treatment/procedure(s) were performed by non-physician practitioner and as supervising physician I was immediately available for consultation/collaboration.   Zhanna Melin, MD 05/22/11 2353 

## 2011-05-22 NOTE — ED Notes (Signed)
Patient is AOx4 and comfortable with his discharge instructions.  Family member to drive him home.

## 2011-05-22 NOTE — Telephone Encounter (Signed)
Caller: Melford Aase; PCP: Sheliah Hatch.; CB#: [phone]; ; ; Call regarding Patient Was Just Started On Amlodipine 5 mg  and Increased Primidone From 500 to 750mg .  States that he fell x 2 on 05/21/11.  Larey Seat today when going to the bathroom.  C/o leg weakness since 05/21/11.  Wife states that he has a history of developing leg weakness and falling with medication changes.  Speech is slurred and is more disoriented.  Slight swelling of left side of face.  Utilized Weakness Guideline.  Advised 911.

## 2011-05-22 NOTE — ED Notes (Signed)
Pt family states that pt legs have been weak since yesterday. Pt has had multiple strokes with residual on right side. Pt fell 3 times since yesterday and has been increasingly lethargic since. Pt has been unable to get around.

## 2011-05-22 NOTE — Discharge Instructions (Signed)
Mr Malik Zuniga your chest -ray and labs today were normal.  Follow up with Dr. Beverely Low tomorrow.  You may be experiencing an adverse reaction to phenobarbital. Please discussed decreasing the dose with Dr. Beverely Low.

## 2011-05-22 NOTE — ED Notes (Signed)
Per EMS, pt here for bilateral leg weakness since this morning. Unable to stand very long on feet. Denies any pain or other complaints. VSS 134/78, 86 HR,  16 RR, 98 RA

## 2011-05-22 NOTE — Telephone Encounter (Signed)
Pt wife states that EMS checked him out and his vital were ok but advise that they would take Pt if he would like. Pt states that he did not want to go so they suggested if it is a med reaction that he should be seen by his PCP. Pt wife is requesting OV for tomorrow. .Please advise

## 2011-05-22 NOTE — Telephone Encounter (Signed)
This does not sound like a medication reaction.  If he is slurring his words and falling, I'm concerned for a stroke.  Would recommend ER visit (even w/ normal vitals) due to his neuro history.

## 2011-05-23 ENCOUNTER — Ambulatory Visit: Payer: Medicare Other | Admitting: Family Medicine

## 2011-05-24 NOTE — ED Provider Notes (Signed)
Medical screening examination/treatment/procedure(s) were performed by non-physician practitioner and as supervising physician I was immediately available for consultation/collaboration.  Geoffery Lyons, MD 05/24/11 [id number]

## 2011-06-02 ENCOUNTER — Telehealth: Payer: Self-pay | Admitting: Family Medicine

## 2011-06-02 DIAGNOSIS — G44309 Post-traumatic headache, unspecified, not intractable: Secondary | ICD-10-CM

## 2011-06-02 NOTE — Telephone Encounter (Signed)
Nicholos Johns states her husband is having some severe headaches & since he has had brain syrgery they are not sure if what to do as far as making appointments with providers.  Please call her back [phone]

## 2011-06-02 NOTE — Telephone Encounter (Signed)
Contacted pt wife and she advised that the pt has been experiencing severe headaches starting in his eyes and the back of his head for about 2 weeks, he also notes the headache leaves the eyes and then focuses on the back of his head, advised that pt had a neuro apt with MD Love last week however pt was noted in hospital at the time, however also noted a normal CAT Scan while there, delegated instructions per MD Beverely Low, pt needs to contact neurologic MD Love to set up and apt asap, pt wife advised that MD Love is retiring soon and she wants to have a referral placed for our group, I advised that we can send the referral and that someone will call her about the upcoming apt, however pt needs to be seen by his current/established neurologic MD Love, pt wife understood, placed referral in the chart, pt aware someone will call her about an apt.

## 2011-06-03 ENCOUNTER — Other Ambulatory Visit: Payer: Self-pay | Admitting: Family Medicine

## 2011-06-03 DIAGNOSIS — F332 Major depressive disorder, recurrent severe without psychotic features: Secondary | ICD-10-CM | POA: Diagnosis not present

## 2011-06-03 MED ORDER — ONDANSETRON HCL 8 MG PO TABS: 8.0000 mg | ORAL_TABLET | Freq: Three times a day (TID) | ORAL | Status: DC | PRN

## 2011-06-03 NOTE — Telephone Encounter (Signed)
Refill for Ondansetron ODT 8MG  ODT #20 Qty 20 Dissolve 1-tablet by mouth every 6-hours as needed for nausea Last filled 3.19.13  Last OV 3.25.13

## 2011-06-03 NOTE — Telephone Encounter (Signed)
rx sent to pharmacy by e-script  

## 2011-06-10 DIAGNOSIS — K3189 Other diseases of stomach and duodenum: Secondary | ICD-10-CM | POA: Diagnosis not present

## 2011-06-10 DIAGNOSIS — R112 Nausea with vomiting, unspecified: Secondary | ICD-10-CM | POA: Diagnosis not present

## 2011-06-10 DIAGNOSIS — J449 Chronic obstructive pulmonary disease, unspecified: Secondary | ICD-10-CM | POA: Diagnosis not present

## 2011-06-10 DIAGNOSIS — R5381 Other malaise: Secondary | ICD-10-CM | POA: Diagnosis not present

## 2011-06-10 DIAGNOSIS — R0609 Other forms of dyspnea: Secondary | ICD-10-CM | POA: Diagnosis not present

## 2011-06-10 DIAGNOSIS — R1013 Epigastric pain: Secondary | ICD-10-CM | POA: Diagnosis not present

## 2011-06-10 DIAGNOSIS — K219 Gastro-esophageal reflux disease without esophagitis: Secondary | ICD-10-CM | POA: Diagnosis not present

## 2011-06-10 DIAGNOSIS — N400 Enlarged prostate without lower urinary tract symptoms: Secondary | ICD-10-CM | POA: Diagnosis not present

## 2011-06-10 DIAGNOSIS — I10 Essential (primary) hypertension: Secondary | ICD-10-CM | POA: Diagnosis not present

## 2011-06-10 DIAGNOSIS — F17200 Nicotine dependence, unspecified, uncomplicated: Secondary | ICD-10-CM | POA: Diagnosis not present

## 2011-06-11 DIAGNOSIS — R413 Other amnesia: Secondary | ICD-10-CM | POA: Diagnosis not present

## 2011-06-16 ENCOUNTER — Other Ambulatory Visit: Payer: Self-pay | Admitting: Family Medicine

## 2011-06-16 ENCOUNTER — Telehealth: Payer: Self-pay | Admitting: Family Medicine

## 2011-06-16 DIAGNOSIS — R9389 Abnormal findings on diagnostic imaging of other specified body structures: Secondary | ICD-10-CM

## 2011-06-16 NOTE — Telephone Encounter (Signed)
I scheduled patient for CT Chest to f/u last abnormal CT, and the appointment was declined.  Patient stated they are just unable to do right now, asked that I please cancel the appointment, and stated they will call and let Dr. Beverely Low know when they want, or are ready to schedule it.

## 2011-06-16 NOTE — Telephone Encounter (Signed)
Noted! Thank you

## 2011-06-17 ENCOUNTER — Ambulatory Visit: Payer: Medicare Other | Admitting: Neurology

## 2011-06-18 ENCOUNTER — Other Ambulatory Visit: Payer: Medicare Other

## 2011-06-24 ENCOUNTER — Other Ambulatory Visit: Payer: Self-pay | Admitting: Family Medicine

## 2011-06-24 NOTE — Telephone Encounter (Signed)
Refill for Ondansetron ODT 8MG  ODT #20  Qty 20  Dissolve 1-tablet by mouth every 6-hours as needed for nausea  Last filled 4.23.13  Last OV 3.25.13

## 2011-06-25 DIAGNOSIS — F331 Major depressive disorder, recurrent, moderate: Secondary | ICD-10-CM | POA: Diagnosis not present

## 2011-06-25 MED ORDER — ONDANSETRON HCL 8 MG PO TABS: 8.0000 mg | ORAL_TABLET | Freq: Three times a day (TID) | ORAL | Status: DC | PRN

## 2011-06-25 NOTE — Telephone Encounter (Signed)
rx sent to pharmacy by e-script  

## 2011-07-01 DIAGNOSIS — Z8546 Personal history of malignant neoplasm of prostate: Secondary | ICD-10-CM | POA: Diagnosis not present

## 2011-07-01 DIAGNOSIS — R6882 Decreased libido: Secondary | ICD-10-CM | POA: Diagnosis not present

## 2011-07-01 DIAGNOSIS — R39198 Other difficulties with micturition: Secondary | ICD-10-CM | POA: Diagnosis not present

## 2011-07-01 DIAGNOSIS — C61 Malignant neoplasm of prostate: Secondary | ICD-10-CM | POA: Diagnosis not present

## 2011-07-09 ENCOUNTER — Other Ambulatory Visit: Payer: Self-pay | Admitting: Family Medicine

## 2011-07-09 MED ORDER — FLUTICASONE PROPIONATE 50 MCG/ACT NA SUSP: 2.0000 | Freq: Every day | NASAL | Status: DC

## 2011-07-09 NOTE — Telephone Encounter (Signed)
rx sent to pharmacy by e-script  

## 2011-07-09 NOTE — Telephone Encounter (Signed)
Refill Fluticasone Propiona 50MCG/INH Nose Aer #16 Last fill 5.10.13 Use 2-sprays in each nostril daily Last ov 3.25.13

## 2011-07-15 DIAGNOSIS — R0602 Shortness of breath: Secondary | ICD-10-CM | POA: Diagnosis not present

## 2011-07-15 DIAGNOSIS — R079 Chest pain, unspecified: Secondary | ICD-10-CM | POA: Diagnosis not present

## 2011-07-17 ENCOUNTER — Other Ambulatory Visit: Payer: Self-pay | Admitting: Family Medicine

## 2011-07-17 MED ORDER — METOPROLOL SUCCINATE ER 100 MG PO TB24: 100.0000 mg | ORAL_TABLET | Freq: Every evening | ORAL | Status: DC

## 2011-07-17 NOTE — Telephone Encounter (Signed)
rx sent to pharmacy by e-script  

## 2011-07-17 NOTE — Telephone Encounter (Signed)
refill metoprolol succinate 25mg  ter #90 Take one tablet by mouth daily  Last fill 05.07.13 Last ov 3.25.13  On snapshot, NOT on current meds listing

## 2011-07-21 DIAGNOSIS — R079 Chest pain, unspecified: Secondary | ICD-10-CM | POA: Diagnosis not present

## 2011-07-21 DIAGNOSIS — R0602 Shortness of breath: Secondary | ICD-10-CM | POA: Diagnosis not present

## 2011-07-25 ENCOUNTER — Ambulatory Visit: Payer: Medicare Other | Admitting: Pulmonary Disease

## 2011-07-31 ENCOUNTER — Encounter: Payer: Self-pay | Admitting: Family Medicine

## 2011-07-31 ENCOUNTER — Ambulatory Visit (INDEPENDENT_AMBULATORY_CARE_PROVIDER_SITE_OTHER): Payer: Medicare Other | Admitting: Family Medicine

## 2011-07-31 VITALS — BP 135/80 | HR 94 | Temp 98.8°F | Ht 70.0 in | Wt 173.6 lb

## 2011-07-31 DIAGNOSIS — R296 Repeated falls: Secondary | ICD-10-CM | POA: Insufficient documentation

## 2011-07-31 DIAGNOSIS — Z9181 History of falling: Secondary | ICD-10-CM | POA: Diagnosis not present

## 2011-07-31 NOTE — Progress Notes (Signed)
  Subjective:    Patient ID: Malik Zuniga, male    DOB: 1945/05/01, 66 y.o.   MRN: [id number]  HPI 'i'm having trouble walking'- 'it's getting worse and worse'.  Was initially able to ambulate w/ cane, now using walker or wheelchair.  'i hate that'.  'my back is killing me'.  Wife feels gait instability is due to leg weakness.  Reports weakness in arms and legs.  'i can't do anything'.  + fatigue.  Wife reports hx of similar when he was titrated up on Lamictal.  Lamictal was stopped and pt stopped falling.  Pt went to ER  April 11 for this and had 'every test known to man and didn't find a thing'.  Psych recently titrated dose of Abilify to 7.5mg  and started falling more frequently.  Wife decreased Abilify to 5 mg daily and over last 2 days falls have decreased.  Off Norvasc.  Primidone is 1 tab BID.  Had improved shaking w/ 1 tab TID but falls seemed to increase.  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   Review of Systems For ROS see HPI     Objective:   Physical Exam  Vitals reviewed. Constitutional: He is oriented to person, place, and time. He appears well-developed and well-nourished.       Sitting hunched in wheel chair, but alert  Cardiovascular: Normal rate, regular rhythm and normal heart sounds.   Pulmonary/Chest: Effort normal and breath sounds normal. No respiratory distress. He has no wheezes. He has no rales.  Neurological: He is alert and oriented to person, place, and time.  Psychiatric: He has a normal mood and affect. His behavior is normal. Thought content normal.          Assessment & Plan:

## 2011-07-31 NOTE — Assessment & Plan Note (Signed)
Pt experiencing frequent falls- likely multifactorial: tremor, deconditioning/weakness, CVA residual, med interactions.  Reviewed ER imaging and labs- all normal.  Pt on multiple psych meds which have fatigue as side effect and could be having difficulty w/ new regimen.  Hesitant to adjust meds as pt has severe bipolar disorder (wife very leery of any changes).  Pt feels neuro was dismissive of his condition when he saw them and I agree he needs a 2nd opinion.  Encouraged pt to contact psych for discussion of med regimen and current sxs.  Wife in agreement.  Total time spent w/ pt and wife, >30 minutes w/ >50% spent counseling.

## 2011-07-31 NOTE — Patient Instructions (Addendum)
Please call [name] and get an appt about possible medication interactions We'll call you with your Neuro appt Please be careful! Call with any questions or concerns Hang in there!

## 2011-08-12 ENCOUNTER — Telehealth: Payer: Self-pay | Admitting: Family Medicine

## 2011-08-12 MED ORDER — ONDANSETRON HCL 8 MG PO TABS: 8.0000 mg | ORAL_TABLET | Freq: Three times a day (TID) | ORAL | Status: DC | PRN

## 2011-08-12 NOTE — Telephone Encounter (Signed)
Refill: Ondansetron odt 8mg  #20. Dissolve 1 tablet by mouth 4 times daily as needed for nausea. Last fill 06-25-11

## 2011-08-12 NOTE — Telephone Encounter (Signed)
rx sent to pharmacy by e-script  

## 2011-08-18 DIAGNOSIS — F331 Major depressive disorder, recurrent, moderate: Secondary | ICD-10-CM | POA: Diagnosis not present

## 2011-08-19 DIAGNOSIS — I5020 Unspecified systolic (congestive) heart failure: Secondary | ICD-10-CM | POA: Diagnosis not present

## 2011-08-19 DIAGNOSIS — R0609 Other forms of dyspnea: Secondary | ICD-10-CM | POA: Diagnosis not present

## 2011-08-19 DIAGNOSIS — I2510 Atherosclerotic heart disease of native coronary artery without angina pectoris: Secondary | ICD-10-CM | POA: Diagnosis not present

## 2011-08-19 DIAGNOSIS — R0989 Other specified symptoms and signs involving the circulatory and respiratory systems: Secondary | ICD-10-CM | POA: Diagnosis not present

## 2011-08-19 DIAGNOSIS — E785 Hyperlipidemia, unspecified: Secondary | ICD-10-CM | POA: Diagnosis not present

## 2011-08-19 DIAGNOSIS — J449 Chronic obstructive pulmonary disease, unspecified: Secondary | ICD-10-CM | POA: Diagnosis not present

## 2011-08-27 DIAGNOSIS — M48061 Spinal stenosis, lumbar region without neurogenic claudication: Secondary | ICD-10-CM | POA: Diagnosis not present

## 2011-09-01 ENCOUNTER — Other Ambulatory Visit: Payer: Self-pay | Admitting: Neurosurgery

## 2011-09-01 DIAGNOSIS — M546 Pain in thoracic spine: Secondary | ICD-10-CM

## 2011-09-01 DIAGNOSIS — M48061 Spinal stenosis, lumbar region without neurogenic claudication: Secondary | ICD-10-CM

## 2011-09-04 ENCOUNTER — Ambulatory Visit
Admission: RE | Admit: 2011-09-04 | Discharge: 2011-09-04 | Disposition: A | Discharge status: Home / Self Care | Payer: Medicare Other | Source: Ambulatory Visit | Attending: Neurosurgery | Admitting: Neurosurgery

## 2011-09-04 VITALS — BP 105/67 | HR 97

## 2011-09-04 DIAGNOSIS — M48061 Spinal stenosis, lumbar region without neurogenic claudication: Secondary | ICD-10-CM

## 2011-09-04 DIAGNOSIS — M549 Dorsalgia, unspecified: Secondary | ICD-10-CM | POA: Diagnosis not present

## 2011-09-04 DIAGNOSIS — M4000 Postural kyphosis, site unspecified: Secondary | ICD-10-CM | POA: Diagnosis not present

## 2011-09-04 DIAGNOSIS — M546 Pain in thoracic spine: Secondary | ICD-10-CM

## 2011-09-04 DIAGNOSIS — M5137 Other intervertebral disc degeneration, lumbosacral region: Secondary | ICD-10-CM | POA: Diagnosis not present

## 2011-09-04 DIAGNOSIS — M47814 Spondylosis without myelopathy or radiculopathy, thoracic region: Secondary | ICD-10-CM | POA: Diagnosis not present

## 2011-09-04 DIAGNOSIS — Z981 Arthrodesis status: Secondary | ICD-10-CM | POA: Diagnosis not present

## 2011-09-04 MED ORDER — ONDANSETRON HCL 4 MG/2ML IJ SOLN: 4.0000 mg | Freq: Once | INTRAMUSCULAR | Status: DC

## 2011-09-04 MED ORDER — IOHEXOL 300 MG/ML  SOLN
10.0000 mL | Freq: Once | INTRAMUSCULAR | Status: AC | PRN
  Administered 2011-09-04: 10 mL via INTRATHECAL

## 2011-09-04 MED ORDER — MEPERIDINE HCL 100 MG/ML IJ SOLN: 75.0000 mg | Freq: Once | INTRAMUSCULAR | Status: DC

## 2011-09-04 MED ORDER — ONDANSETRON HCL 4 MG/2ML IJ SOLN: 4.0000 mg | Freq: Four times a day (QID) | INTRAMUSCULAR | Status: DC | PRN

## 2011-09-04 MED ORDER — DIAZEPAM 5 MG PO TABS
5.0000 mg | ORAL_TABLET | Freq: Once | ORAL | Status: AC
  Administered 2011-09-04: 5 mg via ORAL

## 2011-09-04 NOTE — Progress Notes (Signed)
Pt states he has been off, abilify, cymbalta, trazadone and paroxetine for the past 2 days.

## 2011-09-16 ENCOUNTER — Telehealth: Payer: Self-pay | Admitting: Family Medicine

## 2011-09-16 MED ORDER — ONDANSETRON HCL 8 MG PO TABS: 8.0000 mg | ORAL_TABLET | Freq: Three times a day (TID) | ORAL | Status: DC | PRN

## 2011-09-16 NOTE — Telephone Encounter (Signed)
Refill: Ondansetron odt 8mg  odt #20. Dissolve one tablet every eight hours as needed for nausea. Last fill 08-15-11

## 2011-09-16 NOTE — Telephone Encounter (Signed)
rx sent to pharmacy by e-script  

## 2011-09-17 ENCOUNTER — Telehealth: Payer: Self-pay | Admitting: *Deleted

## 2011-09-17 DIAGNOSIS — Z79899 Other long term (current) drug therapy: Secondary | ICD-10-CM | POA: Diagnosis not present

## 2011-09-17 DIAGNOSIS — I5020 Unspecified systolic (congestive) heart failure: Secondary | ICD-10-CM | POA: Diagnosis not present

## 2011-09-17 DIAGNOSIS — I2510 Atherosclerotic heart disease of native coronary artery without angina pectoris: Secondary | ICD-10-CM | POA: Diagnosis not present

## 2011-09-17 DIAGNOSIS — R0609 Other forms of dyspnea: Secondary | ICD-10-CM | POA: Diagnosis not present

## 2011-09-17 MED ORDER — ONDANSETRON 8 MG PO TBDP: 8.0000 mg | ORAL_TABLET | Freq: Three times a day (TID) | ORAL | Status: AC | PRN

## 2011-09-17 NOTE — Telephone Encounter (Signed)
Pt wife states that Pt prefers to have the dissolvable tabs. Advise Pt wife that med may not be covered by insurance. Per Pt wife it does not matter the cost of med. Verbally advise Dr Beverely Low who states it ok to change to dissolve tab, Rx sent.

## 2011-09-18 ENCOUNTER — Encounter (HOSPITAL_COMMUNITY): Payer: Self-pay | Admitting: Pharmacy Technician

## 2011-09-18 DIAGNOSIS — M48061 Spinal stenosis, lumbar region without neurogenic claudication: Secondary | ICD-10-CM | POA: Diagnosis not present

## 2011-09-22 ENCOUNTER — Other Ambulatory Visit: Payer: Self-pay | Admitting: Neurosurgery

## 2011-09-22 NOTE — Pre-Procedure Instructions (Signed)
20 ELMUS MATHES  09/22/2011   Your procedure is scheduled on:  August 19th  Report to [facility] at 0740 AM.  Call this number if you have problems the morning of surgery: [phone]   Remember:   Do not eat food or drink:After Midnight.  Take these medicines the morning of surgery with A SIP OF WATER: toprol, cymbalta, flonase, vicodin if needed, prilosec,    Do not wear jewelry, make-up or nail polish.  Do not wear lotions, powders, or perfumes. You may wear deodorant.  Do not shave 48 hours prior to surgery. Men may shave face and neck.  Do not bring valuables to the hospital.  Contacts, dentures or bridgework may not be worn into surgery.  Leave suitcase in the car. After surgery it may be brought to your room.  For patients admitted to the hospital, checkout time is 11:00 AM the day of discharge.   Patients discharged the day of surgery will not be allowed to drive home.  Special Instructions: CHG Shower Use Special Wash: 1/2 bottle night before surgery and 1/2 bottle morning of surgery.   Please read over the following fact sheets that you were given: Pain Booklet, Coughing and Deep Breathing, Blood Transfusion Information, MRSA Information and Surgical Site Infection Prevention

## 2011-09-23 ENCOUNTER — Encounter (HOSPITAL_COMMUNITY): Payer: Self-pay

## 2011-09-23 ENCOUNTER — Encounter (HOSPITAL_COMMUNITY)
Admission: RE | Admit: 2011-09-23 | Discharge: 2011-09-23 | Disposition: A | Discharge status: Home / Self Care | Payer: Medicare Other | Source: Ambulatory Visit | Attending: Neurosurgery | Admitting: Neurosurgery

## 2011-09-23 LAB — BASIC METABOLIC PANEL
BUN: 12 mg/dL (ref 6–23)
CO2: 28 mEq/L (ref 19–32)
Chloride: 92 mEq/L — ABNORMAL LOW (ref 96–112)
Creatinine, Ser: 0.72 mg/dL (ref 0.50–1.35)
GFR calc Af Amer: >90 mL/min (ref >90)
Glucose, Bld: 79 mg/dL (ref 70–99)
Potassium: 4.7 mEq/L (ref 3.5–5.1)

## 2011-09-23 LAB — TYPE AND SCREEN: Antibody Screen: NEGATIVE

## 2011-09-23 LAB — CBC
HCT: 40.6 % (ref 39.0–52.0)
Hemoglobin: 14.5 g/dL (ref 13.0–17.0)
MCHC: 35.7 g/dL (ref 30.0–36.0)
MCV: 89.2 fL (ref 78.0–100.0)
RDW: 13.3 % (ref 11.5–15.5)

## 2011-09-23 NOTE — Progress Notes (Signed)
Dr h. Katrinka Blazing called for echo,and ekg

## 2011-09-24 NOTE — Progress Notes (Signed)
Ov stress echo  From [name] is on the chart.

## 2011-09-29 ENCOUNTER — Encounter (HOSPITAL_COMMUNITY): Payer: Self-pay | Admitting: Anesthesiology

## 2011-09-29 ENCOUNTER — Inpatient Hospital Stay (HOSPITAL_COMMUNITY): Payer: Medicare Other | Admitting: Anesthesiology

## 2011-09-29 ENCOUNTER — Encounter (HOSPITAL_COMMUNITY): Payer: Self-pay | Admitting: *Deleted

## 2011-09-29 ENCOUNTER — Encounter (HOSPITAL_COMMUNITY)
Admission: RE | Disposition: A | Discharge status: Home / Self Care | Payer: Self-pay | Source: Ambulatory Visit | Attending: Neurosurgery

## 2011-09-29 ENCOUNTER — Inpatient Hospital Stay (HOSPITAL_COMMUNITY): Payer: Medicare Other

## 2011-09-29 ENCOUNTER — Inpatient Hospital Stay (HOSPITAL_COMMUNITY)
Admission: RE | Admit: 2011-09-29 | Discharge: 2011-10-06 | DRG: 456 | Disposition: A | Discharge status: Home / Self Care | Payer: Medicare Other | Source: Ambulatory Visit | Attending: Neurosurgery | Admitting: Neurosurgery

## 2011-09-29 DIAGNOSIS — E785 Hyperlipidemia, unspecified: Secondary | ICD-10-CM | POA: Diagnosis present

## 2011-09-29 DIAGNOSIS — F3289 Other specified depressive episodes: Secondary | ICD-10-CM

## 2011-09-29 DIAGNOSIS — B965 Pseudomonas (aeruginosa) (mallei) (pseudomallei) as the cause of diseases classified elsewhere: Secondary | ICD-10-CM | POA: Diagnosis not present

## 2011-09-29 DIAGNOSIS — A419 Sepsis, unspecified organism: Secondary | ICD-10-CM

## 2011-09-29 DIAGNOSIS — Z8042 Family history of malignant neoplasm of prostate: Secondary | ICD-10-CM | POA: Diagnosis not present

## 2011-09-29 DIAGNOSIS — I252 Old myocardial infarction: Secondary | ICD-10-CM | POA: Diagnosis not present

## 2011-09-29 DIAGNOSIS — Z8249 Family history of ischemic heart disease and other diseases of the circulatory system: Secondary | ICD-10-CM | POA: Diagnosis not present

## 2011-09-29 DIAGNOSIS — Z9861 Coronary angioplasty status: Secondary | ICD-10-CM

## 2011-09-29 DIAGNOSIS — M961 Postlaminectomy syndrome, not elsewhere classified: Secondary | ICD-10-CM | POA: Diagnosis not present

## 2011-09-29 DIAGNOSIS — M40299 Other kyphosis, site unspecified: Secondary | ICD-10-CM | POA: Diagnosis not present

## 2011-09-29 DIAGNOSIS — J9600 Acute respiratory failure, unspecified whether with hypoxia or hypercapnia: Secondary | ICD-10-CM | POA: Diagnosis not present

## 2011-09-29 DIAGNOSIS — M419 Scoliosis, unspecified: Secondary | ICD-10-CM

## 2011-09-29 DIAGNOSIS — Z8673 Personal history of transient ischemic attack (TIA), and cerebral infarction without residual deficits: Secondary | ICD-10-CM

## 2011-09-29 DIAGNOSIS — M546 Pain in thoracic spine: Secondary | ICD-10-CM | POA: Diagnosis not present

## 2011-09-29 DIAGNOSIS — I10 Essential (primary) hypertension: Secondary | ICD-10-CM | POA: Diagnosis not present

## 2011-09-29 DIAGNOSIS — R7881 Bacteremia: Secondary | ICD-10-CM

## 2011-09-29 DIAGNOSIS — M4180 Other forms of scoliosis, site unspecified: Secondary | ICD-10-CM | POA: Diagnosis not present

## 2011-09-29 DIAGNOSIS — R0902 Hypoxemia: Secondary | ICD-10-CM | POA: Diagnosis not present

## 2011-09-29 DIAGNOSIS — J9601 Acute respiratory failure with hypoxia: Secondary | ICD-10-CM

## 2011-09-29 DIAGNOSIS — M545 Low back pain: Secondary | ICD-10-CM | POA: Diagnosis not present

## 2011-09-29 DIAGNOSIS — R000 Tachycardia, unspecified: Secondary | ICD-10-CM | POA: Diagnosis not present

## 2011-09-29 DIAGNOSIS — Z8546 Personal history of malignant neoplasm of prostate: Secondary | ICD-10-CM | POA: Diagnosis not present

## 2011-09-29 DIAGNOSIS — R5082 Postprocedural fever: Secondary | ICD-10-CM | POA: Diagnosis not present

## 2011-09-29 DIAGNOSIS — A4152 Sepsis due to Pseudomonas: Secondary | ICD-10-CM | POA: Diagnosis not present

## 2011-09-29 DIAGNOSIS — Z808 Family history of malignant neoplasm of other organs or systems: Secondary | ICD-10-CM | POA: Diagnosis not present

## 2011-09-29 DIAGNOSIS — J449 Chronic obstructive pulmonary disease, unspecified: Secondary | ICD-10-CM | POA: Diagnosis not present

## 2011-09-29 DIAGNOSIS — I2510 Atherosclerotic heart disease of native coronary artery without angina pectoris: Secondary | ICD-10-CM | POA: Diagnosis present

## 2011-09-29 DIAGNOSIS — J988 Other specified respiratory disorders: Secondary | ICD-10-CM | POA: Diagnosis not present

## 2011-09-29 DIAGNOSIS — J438 Other emphysema: Secondary | ICD-10-CM | POA: Diagnosis present

## 2011-09-29 DIAGNOSIS — J984 Other disorders of lung: Secondary | ICD-10-CM | POA: Diagnosis not present

## 2011-09-29 DIAGNOSIS — D649 Anemia, unspecified: Secondary | ICD-10-CM | POA: Diagnosis present

## 2011-09-29 DIAGNOSIS — J9819 Other pulmonary collapse: Secondary | ICD-10-CM | POA: Diagnosis not present

## 2011-09-29 DIAGNOSIS — D62 Acute posthemorrhagic anemia: Secondary | ICD-10-CM | POA: Diagnosis not present

## 2011-09-29 DIAGNOSIS — J22 Unspecified acute lower respiratory infection: Secondary | ICD-10-CM

## 2011-09-29 DIAGNOSIS — F329 Major depressive disorder, single episode, unspecified: Secondary | ICD-10-CM | POA: Diagnosis present

## 2011-09-29 DIAGNOSIS — Z09 Encounter for follow-up examination after completed treatment for conditions other than malignant neoplasm: Secondary | ICD-10-CM | POA: Diagnosis not present

## 2011-09-29 DIAGNOSIS — M4120 Other idiopathic scoliosis, site unspecified: Secondary | ICD-10-CM | POA: Diagnosis not present

## 2011-09-29 DIAGNOSIS — M549 Dorsalgia, unspecified: Secondary | ICD-10-CM | POA: Diagnosis not present

## 2011-09-29 DIAGNOSIS — R0989 Other specified symptoms and signs involving the circulatory and respiratory systems: Secondary | ICD-10-CM | POA: Diagnosis not present

## 2011-09-29 HISTORY — DX: Acute posthemorrhagic anemia: D62

## 2011-09-29 SURGERY — POSTERIOR LUMBAR FUSION 4 LEVEL
Anesthesia: General | Site: Back | Wound class: Clean

## 2011-09-29 MED ORDER — LACTATED RINGERS IV SOLN
INTRAVENOUS | Status: DC | PRN
  Administered 2011-09-29 (×4): via INTRAVENOUS

## 2011-09-29 MED ORDER — HYDROCODONE-ACETAMINOPHEN 5-325 MG PO TABS
1.0000 | ORAL_TABLET | ORAL | Status: DC | PRN
  Administered 2011-09-29 – 2011-10-03 (×9): 2 via ORAL
  Administered 2011-10-03: 1 via ORAL
  Administered 2011-10-03: 2 via ORAL
  Administered 2011-10-03 (×2): 1 via ORAL
  Administered 2011-10-04 – 2011-10-06 (×12): 2 via ORAL
  Filled 2011-09-29 (×3): qty 2
  Filled 2011-09-29: qty 1
  Filled 2011-09-29 (×8): qty 2
  Filled 2011-09-29 (×2): qty 1
  Filled 2011-09-29 (×3): qty 2
  Filled 2011-09-29: qty 1
  Filled 2011-09-29 (×5): qty 2
  Filled 2011-09-29: qty 1

## 2011-09-29 MED ORDER — FENTANYL CITRATE 0.05 MG/ML IJ SOLN
INTRAMUSCULAR | Status: DC | PRN
  Administered 2011-09-29 (×2): 50 ug via INTRAVENOUS
  Administered 2011-09-29 (×2): 100 ug via INTRAVENOUS
  Administered 2011-09-29 (×3): 50 ug via INTRAVENOUS

## 2011-09-29 MED ORDER — HYDROCODONE-ACETAMINOPHEN 5-325 MG PO TABS
1.0000 | ORAL_TABLET | ORAL | Status: DC | PRN
  Administered 2011-09-29: 2 via ORAL
  Filled 2011-09-29 (×3): qty 2

## 2011-09-29 MED ORDER — OMEGA-3-ACID ETHYL ESTERS 1 G PO CAPS
1.0000 g | ORAL_CAPSULE | Freq: Every day | ORAL | Status: DC
  Administered 2011-09-29 – 2011-10-06 (×8): 1 g via ORAL
  Filled 2011-09-29 (×9): qty 1

## 2011-09-29 MED ORDER — ONDANSETRON HCL 4 MG/2ML IJ SOLN
INTRAMUSCULAR | Status: DC | PRN
  Administered 2011-09-29: 4 mg via INTRAVENOUS

## 2011-09-29 MED ORDER — MIDAZOLAM HCL 5 MG/5ML IJ SOLN
INTRAMUSCULAR | Status: DC | PRN
  Administered 2011-09-29: 2 mg via INTRAVENOUS

## 2011-09-29 MED ORDER — SODIUM CHLORIDE 0.9 % IV SOLN: 250.0000 mL | INTRAVENOUS | Status: DC

## 2011-09-29 MED ORDER — PANTOPRAZOLE SODIUM 40 MG PO TBEC
40.0000 mg | DELAYED_RELEASE_TABLET | Freq: Every day | ORAL | Status: DC
  Administered 2011-09-30 – 2011-10-06 (×7): 40 mg via ORAL
  Filled 2011-09-29 (×5): qty 1

## 2011-09-29 MED ORDER — LIDOCAINE HCL (CARDIAC) 20 MG/ML IV SOLN
INTRAVENOUS | Status: DC | PRN
  Administered 2011-09-29: 40 mg via INTRAVENOUS

## 2011-09-29 MED ORDER — DIAZEPAM 5 MG PO TABS
5.0000 mg | ORAL_TABLET | Freq: Four times a day (QID) | ORAL | Status: DC | PRN
  Administered 2011-10-01 – 2011-10-06 (×7): 5 mg via ORAL
  Filled 2011-09-29 (×7): qty 1

## 2011-09-29 MED ORDER — NEOSTIGMINE METHYLSULFATE 1 MG/ML IJ SOLN
INTRAMUSCULAR | Status: DC | PRN
  Administered 2011-09-29: 5 mg via INTRAVENOUS

## 2011-09-29 MED ORDER — PHENYLEPHRINE HCL 10 MG/ML IJ SOLN
INTRAMUSCULAR | Status: DC | PRN
  Administered 2011-09-29 (×8): 80 ug via INTRAVENOUS

## 2011-09-29 MED ORDER — CEFAZOLIN SODIUM 1-5 GM-% IV SOLN: 1.0000 g | INTRAVENOUS | Status: DC

## 2011-09-29 MED ORDER — LACTATED RINGERS IV SOLN
INTRAVENOUS | Status: DC | PRN
  Administered 2011-09-29: 12:00:00 via INTRAVENOUS

## 2011-09-29 MED ORDER — ALBUTEROL SULFATE HFA 108 (90 BASE) MCG/ACT IN AERS
INHALATION_SPRAY | RESPIRATORY_TRACT | Status: DC | PRN
  Administered 2011-09-29: 2 via RESPIRATORY_TRACT
  Administered 2011-09-29: 4 via RESPIRATORY_TRACT

## 2011-09-29 MED ORDER — DEXAMETHASONE SODIUM PHOSPHATE 10 MG/ML IJ SOLN
10.0000 mg | INTRAMUSCULAR | Status: AC
  Administered 2011-09-29: 10 mg via INTRAVENOUS
  Filled 2011-09-29: qty 1

## 2011-09-29 MED ORDER — VITAMIN D3 25 MCG (1000 UNIT) PO TABS
1000.0000 [IU] | ORAL_TABLET | Freq: Every day | ORAL | Status: DC
  Administered 2011-09-29 – 2011-10-06 (×8): 1000 [IU] via ORAL
  Filled 2011-09-29 (×9): qty 1

## 2011-09-29 MED ORDER — VECURONIUM BROMIDE 10 MG IV SOLR
INTRAVENOUS | Status: DC | PRN
  Administered 2011-09-29: 2 mg via INTRAVENOUS
  Administered 2011-09-29: 1 mg via INTRAVENOUS
  Administered 2011-09-29: 3 mg via INTRAVENOUS
  Administered 2011-09-29 (×3): 1 mg via INTRAVENOUS
  Administered 2011-09-29: 2 mg via INTRAVENOUS

## 2011-09-29 MED ORDER — LIDOCAINE HCL 4 % MT SOLN
OROMUCOSAL | Status: DC | PRN
  Administered 2011-09-29: 4 mL via TOPICAL

## 2011-09-29 MED ORDER — BUPIVACAINE HCL (PF) 0.25 % IJ SOLN
INTRAMUSCULAR | Status: DC | PRN
  Administered 2011-09-29: 30 mL

## 2011-09-29 MED ORDER — POLYETHYLENE GLYCOL 3350 17 G PO PACK
17.0000 g | PACK | Freq: Every day | ORAL | Status: DC | PRN
  Administered 2011-10-01: 17 g via ORAL
  Filled 2011-09-29 (×2): qty 1

## 2011-09-29 MED ORDER — ASPIRIN 81 MG PO TBEC: 81.0000 mg | DELAYED_RELEASE_TABLET | Freq: Every day | ORAL | Status: DC

## 2011-09-29 MED ORDER — POLYETHYLENE GLYCOL 3350 17 G PO PACK
17.0000 g | PACK | Freq: Every day | ORAL | Status: DC
  Administered 2011-09-29 – 2011-10-06 (×8): 17 g via ORAL
  Filled 2011-09-29 (×9): qty 1

## 2011-09-29 MED ORDER — MORPHINE SULFATE 10 MG/ML IJ SOLN
INTRAMUSCULAR | Status: DC | PRN
  Administered 2011-09-29 (×2): 2.5 mg via INTRAVENOUS
  Administered 2011-09-29: 5 mg via INTRAVENOUS

## 2011-09-29 MED ORDER — HYDROMORPHONE HCL PF 1 MG/ML IJ SOLN
0.2500 mg | INTRAMUSCULAR | Status: DC | PRN
  Administered 2011-09-29 (×2): 0.5 mg via INTRAVENOUS

## 2011-09-29 MED ORDER — SIMVASTATIN 40 MG PO TABS
40.0000 mg | ORAL_TABLET | Freq: Every evening | ORAL | Status: DC
  Administered 2011-09-29 – 2011-10-05 (×6): 40 mg via ORAL
  Filled 2011-09-29 (×8): qty 1

## 2011-09-29 MED ORDER — THROMBIN 20000 UNITS EX SOLR
CUTANEOUS | Status: DC | PRN
  Administered 2011-09-29: 11:00:00 via TOPICAL

## 2011-09-29 MED ORDER — ROCURONIUM BROMIDE 100 MG/10ML IV SOLN
INTRAVENOUS | Status: DC | PRN
  Administered 2011-09-29: 50 mg via INTRAVENOUS

## 2011-09-29 MED ORDER — TIOTROPIUM BROMIDE MONOHYDRATE 18 MCG IN CAPS
18.0000 ug | ORAL_CAPSULE | Freq: Every day | RESPIRATORY_TRACT | Status: DC
  Administered 2011-09-30 – 2011-10-03 (×4): 18 ug via RESPIRATORY_TRACT
  Filled 2011-09-29: qty 5

## 2011-09-29 MED ORDER — ZINC SULFATE 220 (50 ZN) MG PO CAPS
220.0000 mg | ORAL_CAPSULE | Freq: Every day | ORAL | Status: DC
  Administered 2011-09-29 – 2011-10-05 (×7): 220 mg via ORAL
  Filled 2011-09-29 (×9): qty 1

## 2011-09-29 MED ORDER — BACITRACIN 50000 UNITS IM SOLR
INTRAMUSCULAR | Status: AC
  Filled 2011-09-29: qty 1

## 2011-09-29 MED ORDER — GLYCOPYRROLATE 0.2 MG/ML IJ SOLN
INTRAMUSCULAR | Status: DC | PRN
  Administered 2011-09-29: .6 mg via INTRAVENOUS

## 2011-09-29 MED ORDER — ARIPIPRAZOLE 5 MG PO TABS
5.0000 mg | ORAL_TABLET | Freq: Every day | ORAL | Status: DC
  Administered 2011-09-29 – 2011-10-06 (×8): 5 mg via ORAL
  Filled 2011-09-29 (×9): qty 1

## 2011-09-29 MED ORDER — ONDANSETRON HCL 4 MG/2ML IJ SOLN
4.0000 mg | INTRAMUSCULAR | Status: DC | PRN
  Administered 2011-09-30 – 2011-10-06 (×5): 4 mg via INTRAVENOUS
  Filled 2011-09-29 (×5): qty 2

## 2011-09-29 MED ORDER — OLMESARTAN MEDOXOMIL-HCTZ 20-12.5 MG PO TABS: 1.0000 | ORAL_TABLET | Freq: Every day | ORAL | Status: DC

## 2011-09-29 MED ORDER — IRBESARTAN 150 MG PO TABS
150.0000 mg | ORAL_TABLET | Freq: Every day | ORAL | Status: DC
  Administered 2011-09-29 – 2011-10-02 (×4): 150 mg via ORAL
  Filled 2011-09-29 (×4): qty 1

## 2011-09-29 MED ORDER — CEFAZOLIN SODIUM 1-5 GM-% IV SOLN
1.0000 g | Freq: Three times a day (TID) | INTRAVENOUS | Status: AC
  Administered 2011-09-29 – 2011-09-30 (×2): 1 g via INTRAVENOUS
  Filled 2011-09-29 (×2): qty 50

## 2011-09-29 MED ORDER — HYDROMORPHONE HCL PF 1 MG/ML IJ SOLN
INTRAMUSCULAR | Status: AC
  Filled 2011-09-29: qty 1

## 2011-09-29 MED ORDER — CEFAZOLIN SODIUM-DEXTROSE 2-3 GM-% IV SOLR
INTRAVENOUS | Status: AC
  Filled 2011-09-29: qty 50

## 2011-09-29 MED ORDER — PAROXETINE HCL 30 MG PO TABS
15.0000 mg | ORAL_TABLET | Freq: Every day | ORAL | Status: DC
  Administered 2011-09-30 – 2011-10-06 (×7): 15 mg via ORAL
  Filled 2011-09-29 (×8): qty 0.5

## 2011-09-29 MED ORDER — 0.9 % SODIUM CHLORIDE (POUR BTL) OPTIME
TOPICAL | Status: DC | PRN
  Administered 2011-09-29: 1000 mL

## 2011-09-29 MED ORDER — DIAZEPAM 5 MG PO TABS
5.0000 mg | ORAL_TABLET | Freq: Every day | ORAL | Status: DC
  Administered 2011-09-30 – 2011-10-06 (×7): 5 mg via ORAL
  Filled 2011-09-29 (×7): qty 1

## 2011-09-29 MED ORDER — OXYCODONE-ACETAMINOPHEN 5-325 MG PO TABS
1.0000 | ORAL_TABLET | ORAL | Status: DC | PRN
  Administered 2011-09-30: 1 via ORAL
  Administered 2011-09-30 – 2011-10-02 (×4): 2 via ORAL
  Filled 2011-09-29: qty 1
  Filled 2011-09-29 (×4): qty 2

## 2011-09-29 MED ORDER — PHENYLEPH-CPM-DM-APAP 5-2-10-325 MG PO CAPS
2.0000 | ORAL_CAPSULE | Freq: Every day | ORAL | Status: DC
  Administered 2011-10-01: 2 via ORAL
  Filled 2011-09-29 (×3): qty 1

## 2011-09-29 MED ORDER — SODIUM CHLORIDE 0.9 % IR SOLN
Status: DC | PRN
  Administered 2011-09-29: 11:00:00

## 2011-09-29 MED ORDER — ALBUMIN HUMAN 5 % IV SOLN
INTRAVENOUS | Status: DC | PRN
  Administered 2011-09-29 (×3): via INTRAVENOUS

## 2011-09-29 MED ORDER — MIDAZOLAM HCL 2 MG/2ML IJ SOLN: 0.5000 mg | Freq: Once | INTRAMUSCULAR | Status: DC | PRN

## 2011-09-29 MED ORDER — ACETAMINOPHEN 650 MG RE SUPP: 650.0000 mg | RECTAL | Status: DC | PRN

## 2011-09-29 MED ORDER — PRIMIDONE 250 MG PO TABS
250.0000 mg | ORAL_TABLET | Freq: Two times a day (BID) | ORAL | Status: DC
  Administered 2011-09-29 – 2011-10-06 (×14): 250 mg via ORAL
  Filled 2011-09-29 (×17): qty 1

## 2011-09-29 MED ORDER — METOPROLOL SUCCINATE ER 50 MG PO TB24
50.0000 mg | ORAL_TABLET | Freq: Every day | ORAL | Status: DC
  Administered 2011-10-01 – 2011-10-02 (×2): 50 mg via ORAL
  Filled 2011-09-29 (×4): qty 1

## 2011-09-29 MED ORDER — DOCUSATE SODIUM 100 MG PO CAPS
200.0000 mg | ORAL_CAPSULE | Freq: Every day | ORAL | Status: DC
  Administered 2011-09-29 – 2011-10-06 (×8): 200 mg via ORAL
  Filled 2011-09-29 (×3): qty 2
  Filled 2011-09-29 (×3): qty 1
  Filled 2011-09-29: qty 2

## 2011-09-29 MED ORDER — SENNA 8.6 MG PO TABS
1.0000 | ORAL_TABLET | Freq: Two times a day (BID) | ORAL | Status: DC
  Administered 2011-09-29 – 2011-10-06 (×14): 8.6 mg via ORAL
  Filled 2011-09-29 (×18): qty 1

## 2011-09-29 MED ORDER — ASPIRIN EC 81 MG PO TBEC
81.0000 mg | DELAYED_RELEASE_TABLET | Freq: Every day | ORAL | Status: DC
  Administered 2011-09-29 – 2011-10-06 (×8): 81 mg via ORAL
  Filled 2011-09-29 (×9): qty 1

## 2011-09-29 MED ORDER — ACETAMINOPHEN 10 MG/ML IV SOLN
INTRAVENOUS | Status: AC
  Administered 2011-09-29: 1000 mg via INTRAVENOUS
  Filled 2011-09-29: qty 100

## 2011-09-29 MED ORDER — PHENOL 1.4 % MT LIQD: 1.0000 | OROMUCOSAL | Status: DC | PRN

## 2011-09-29 MED ORDER — ACETAMINOPHEN 325 MG PO TABS: 650.0000 mg | ORAL_TABLET | ORAL | Status: DC | PRN

## 2011-09-29 MED ORDER — PHENYLEPH-CPM-DM-APAP 5-2-10-325 MG PO CAPS: 2.0000 | ORAL_CAPSULE | Freq: Every day | ORAL | Status: DC

## 2011-09-29 MED ORDER — DIAZEPAM 5 MG PO TABS
5.0000 mg | ORAL_TABLET | Freq: Every day | ORAL | Status: DC
  Administered 2011-09-29 – 2011-10-05 (×7): 5 mg via ORAL
  Filled 2011-09-29 (×8): qty 1

## 2011-09-29 MED ORDER — SODIUM CHLORIDE 0.9 % IV SOLN
INTRAVENOUS | Status: DC | PRN
  Administered 2011-09-29: 13:00:00 via INTRAVENOUS

## 2011-09-29 MED ORDER — ALUM & MAG HYDROXIDE-SIMETH 200-200-20 MG/5ML PO SUSP
30.0000 mL | Freq: Four times a day (QID) | ORAL | Status: DC | PRN
  Administered 2011-10-06 (×2): 30 mL via ORAL
  Filled 2011-09-29 (×2): qty 30

## 2011-09-29 MED ORDER — HEPARIN SODIUM (PORCINE) 1000 UNIT/ML IJ SOLN
INTRAMUSCULAR | Status: AC
  Filled 2011-09-29: qty 1

## 2011-09-29 MED ORDER — PROPOFOL 10 MG/ML IV EMUL
INTRAVENOUS | Status: DC | PRN
  Administered 2011-09-29: 140 mg via INTRAVENOUS

## 2011-09-29 MED ORDER — ZINC 50 MG PO TABS: 50.0000 mg | ORAL_TABLET | Freq: Every day | ORAL | Status: DC

## 2011-09-29 MED ORDER — ZOLPIDEM TARTRATE 5 MG PO TABS
5.0000 mg | ORAL_TABLET | Freq: Every evening | ORAL | Status: DC | PRN
  Administered 2011-10-01: 5 mg via ORAL
  Filled 2011-09-29: qty 1

## 2011-09-29 MED ORDER — SODIUM CHLORIDE 0.9 % IV SOLN
INTRAVENOUS | Status: AC
  Filled 2011-09-29: qty 500

## 2011-09-29 MED ORDER — ALBUTEROL SULFATE HFA 108 (90 BASE) MCG/ACT IN AERS
2.0000 | INHALATION_SPRAY | Freq: Four times a day (QID) | RESPIRATORY_TRACT | Status: DC | PRN
Start: 2011-09-29 — End: 2011-10-06
  Filled 2011-09-29: qty 6.7

## 2011-09-29 MED ORDER — SODIUM CHLORIDE 0.9 % IJ SOLN: 3.0000 mL | INTRAMUSCULAR | Status: DC | PRN

## 2011-09-29 MED ORDER — MEPERIDINE HCL 25 MG/ML IJ SOLN: 6.2500 mg | INTRAMUSCULAR | Status: DC | PRN

## 2011-09-29 MED ORDER — DIVALPROEX SODIUM ER 500 MG PO TB24
1000.0000 mg | ORAL_TABLET | Freq: Every day | ORAL | Status: DC
  Administered 2011-09-29 – 2011-10-05 (×7): 1000 mg via ORAL
  Filled 2011-09-29 (×9): qty 2

## 2011-09-29 MED ORDER — SODIUM CHLORIDE 0.9 % IJ SOLN
3.0000 mL | Freq: Two times a day (BID) | INTRAMUSCULAR | Status: DC
  Administered 2011-09-29 – 2011-10-06 (×11): 3 mL via INTRAVENOUS

## 2011-09-29 MED ORDER — CEFAZOLIN SODIUM-DEXTROSE 2-3 GM-% IV SOLR
2.0000 g | INTRAVENOUS | Status: AC
  Administered 2011-09-29 (×2): 2 g via INTRAVENOUS
  Filled 2011-09-29 (×2): qty 50

## 2011-09-29 MED ORDER — MENTHOL 3 MG MT LOZG: 1.0000 | LOZENGE | OROMUCOSAL | Status: DC | PRN

## 2011-09-29 MED ORDER — PHENYLEPHRINE HCL 10 MG/ML IJ SOLN
10.0000 mg | INTRAVENOUS | Status: DC | PRN
  Administered 2011-09-29: 10 ug/min via INTRAVENOUS

## 2011-09-29 MED ORDER — DIAZEPAM 5 MG PO TABS: 2.5000 mg | ORAL_TABLET | Freq: Three times a day (TID) | ORAL | Status: DC

## 2011-09-29 MED ORDER — BUDESONIDE-FORMOTEROL FUMARATE 160-4.5 MCG/ACT IN AERO
2.0000 | INHALATION_SPRAY | Freq: Two times a day (BID) | RESPIRATORY_TRACT | Status: DC
  Administered 2011-09-29 – 2011-10-05 (×10): 2 via RESPIRATORY_TRACT
  Filled 2011-09-29 (×2): qty 6

## 2011-09-29 MED ORDER — VITAMIN C 500 MG PO TABS
500.0000 mg | ORAL_TABLET | Freq: Every day | ORAL | Status: DC
  Administered 2011-09-29 – 2011-10-06 (×8): 500 mg via ORAL
  Filled 2011-09-29 (×9): qty 1

## 2011-09-29 MED ORDER — HYDROMORPHONE HCL PF 1 MG/ML IJ SOLN
0.5000 mg | INTRAMUSCULAR | Status: DC | PRN
  Administered 2011-09-30 – 2011-10-02 (×6): 1 mg via INTRAVENOUS
  Filled 2011-09-29 (×6): qty 1

## 2011-09-29 MED ORDER — FLEET ENEMA 7-19 GM/118ML RE ENEM: 1.0000 | ENEMA | Freq: Once | RECTAL | Status: AC | PRN

## 2011-09-29 MED ORDER — ACETAMINOPHEN 325 MG PO TABS: 650.0000 mg | ORAL_TABLET | Freq: Four times a day (QID) | ORAL | Status: DC | PRN

## 2011-09-29 MED ORDER — DIAZEPAM 5 MG PO TABS
2.5000 mg | ORAL_TABLET | Freq: Every day | ORAL | Status: DC
  Administered 2011-09-30 – 2011-10-06 (×7): 2.5 mg via ORAL
  Filled 2011-09-29 (×7): qty 1

## 2011-09-29 MED ORDER — HYDROCHLOROTHIAZIDE 12.5 MG PO CAPS
12.5000 mg | ORAL_CAPSULE | Freq: Every day | ORAL | Status: DC
  Administered 2011-09-30 – 2011-10-02 (×2): 12.5 mg via ORAL
  Filled 2011-09-29 (×4): qty 1

## 2011-09-29 MED ORDER — BISACODYL 10 MG RE SUPP
10.0000 mg | Freq: Every day | RECTAL | Status: DC | PRN
  Administered 2011-10-02: 10 mg via RECTAL
  Filled 2011-09-29: qty 1

## 2011-09-29 MED ORDER — PROMETHAZINE HCL 25 MG/ML IJ SOLN: 6.2500 mg | INTRAMUSCULAR | Status: DC | PRN

## 2011-09-29 MED ORDER — NITROGLYCERIN 0.4 MG SL SUBL: 0.4000 mg | SUBLINGUAL_TABLET | SUBLINGUAL | Status: DC | PRN

## 2011-09-29 MED ORDER — FLUTICASONE PROPIONATE 50 MCG/ACT NA SUSP
2.0000 | Freq: Every day | NASAL | Status: DC
  Administered 2011-09-29 – 2011-10-06 (×8): 2 via NASAL
  Filled 2011-09-29 (×2): qty 16

## 2011-09-29 MED ORDER — DULOXETINE HCL 60 MG PO CPEP
60.0000 mg | ORAL_CAPSULE | Freq: Every day | ORAL | Status: DC
  Administered 2011-09-29 – 2011-10-06 (×8): 60 mg via ORAL
  Filled 2011-09-29 (×9): qty 1

## 2011-09-29 SURGICAL SUPPLY — 77 items
ADH SKN CLS APL DERMABOND .7 (GAUZE/BANDAGES/DRESSINGS)
ADH SKN CLS LQ APL DERMABOND (GAUZE/BANDAGES/DRESSINGS) ×3
APL SKNCLS STERI-STRIP NONHPOA (GAUZE/BANDAGES/DRESSINGS) ×2
BAG DECANTER FOR FLEXI CONT (MISCELLANEOUS) ×2 IMPLANT
BENZOIN TINCTURE PRP APPL 2/3 (GAUZE/BANDAGES/DRESSINGS) ×3 IMPLANT
BLADE SURG ROTATE 9660 (MISCELLANEOUS) IMPLANT
BRUSH SCRUB EZ PLAIN DRY (MISCELLANEOUS) ×2 IMPLANT
BUR MATCHSTICK NEURO 3.0 LAGG (BURR) ×2 IMPLANT
CANISTER SUCTION 2500CC (MISCELLANEOUS) ×2 IMPLANT
CAP LCK SPNE (Orthopedic Implant) ×21 IMPLANT
CAP LOCK SPINE RADIUS (Orthopedic Implant) IMPLANT
CAP LOCKING (Orthopedic Implant) ×42 IMPLANT
CLOTH BEACON ORANGE TIMEOUT ST (SAFETY) ×2 IMPLANT
CONT SPEC 4OZ CLIKSEAL STRL BL (MISCELLANEOUS) ×5 IMPLANT
COVER BACK TABLE 24X17X13 BIG (DRAPES) IMPLANT
COVER TABLE BACK 60X90 (DRAPES) ×2 IMPLANT
CROSSLINK VARIABLE M-A (Orthopedic Implant) ×1 IMPLANT
DECANTER SPIKE VIAL GLASS SM (MISCELLANEOUS) ×1 IMPLANT
DERMABOND ADHESIVE PROPEN (GAUZE/BANDAGES/DRESSINGS) ×3
DERMABOND ADVANCED (GAUZE/BANDAGES/DRESSINGS)
DERMABOND ADVANCED .7 DNX12 (GAUZE/BANDAGES/DRESSINGS) ×1 IMPLANT
DERMABOND ADVANCED .7 DNX6 (GAUZE/BANDAGES/DRESSINGS) IMPLANT
DRAPE C-ARM 42X72 X-RAY (DRAPES) ×5 IMPLANT
DRAPE LAPAROTOMY 100X72X124 (DRAPES) ×2 IMPLANT
DRAPE POUCH INSTRU U-SHP 10X18 (DRAPES) ×2 IMPLANT
DRAPE PROXIMA HALF (DRAPES) IMPLANT
DRAPE SURG 17X23 STRL (DRAPES) ×8 IMPLANT
ELECT CAUTERY BLADE 6.4 (BLADE) ×1 IMPLANT
ELECT REM PT RETURN 9FT ADLT (ELECTROSURGICAL) ×2
ELECTRODE REM PT RTRN 9FT ADLT (ELECTROSURGICAL) ×1 IMPLANT
EVACUATOR 1/8 PVC DRAIN (DRAIN) ×1 IMPLANT
EVACUATOR 3/16  PVC DRAIN (DRAIN) ×1
EVACUATOR 3/16 PVC DRAIN (DRAIN) IMPLANT
GAUZE SPONGE 4X4 16PLY XRAY LF (GAUZE/BANDAGES/DRESSINGS) ×1 IMPLANT
GLOVE BIO SURGEON STRL SZ8.5 (GLOVE) ×2 IMPLANT
GLOVE BIOGEL PI IND STRL 7.0 (GLOVE) IMPLANT
GLOVE BIOGEL PI INDICATOR 7.0 (GLOVE) ×4
GLOVE ECLIPSE 8.5 STRL (GLOVE) ×4 IMPLANT
GLOVE EXAM NITRILE LRG STRL (GLOVE) IMPLANT
GLOVE EXAM NITRILE MD LF STRL (GLOVE) ×1 IMPLANT
GLOVE EXAM NITRILE XL STR (GLOVE) IMPLANT
GLOVE EXAM NITRILE XS STR PU (GLOVE) IMPLANT
GLOVE SS BIOGEL STRL SZ 6.5 (GLOVE) IMPLANT
GLOVE SS BIOGEL STRL SZ 8 (GLOVE) IMPLANT
GLOVE SUPERSENSE BIOGEL SZ 6.5 (GLOVE) ×2
GLOVE SUPERSENSE BIOGEL SZ 8 (GLOVE) ×2
GLOVE SURG SS PI 6.5 STRL IVOR (GLOVE) ×2 IMPLANT
GOWN BRE IMP SLV AUR LG STRL (GOWN DISPOSABLE) ×2 IMPLANT
GOWN BRE IMP SLV AUR XL STRL (GOWN DISPOSABLE) ×6 IMPLANT
GOWN STRL REIN 2XL LVL4 (GOWN DISPOSABLE) IMPLANT
KIT BASIN OR (CUSTOM PROCEDURE TRAY) ×2 IMPLANT
KIT ROOM TURNOVER OR (KITS) ×2 IMPLANT
MASTERGRAFT MATX STRIP 24CC (Orthopedic Implant) ×4 IMPLANT
MASTERGRAFT MATX STRIP 36X2X.6 ×2 IMPLANT
MATRIX MASTERGRAFT STR 36X2X.6 IMPLANT
MATRIX MASTERGRAFT STRIP 24CC (Orthopedic Implant) IMPLANT
NEEDLE HYPO 22GX1.5 SAFETY (NEEDLE) ×2 IMPLANT
NS IRRIG 1000ML POUR BTL (IV SOLUTION) ×2 IMPLANT
PACK LAMINECTOMY NEURO (CUSTOM PROCEDURE TRAY) ×2 IMPLANT
PAD SHARPS MAGNETIC DISPOSAL (MISCELLANEOUS) ×1 IMPLANT
SCREW 5.75X40M (Screw) ×6 IMPLANT
SCREW 5.75X45MM (Screw) ×5 IMPLANT
SPONGE GAUZE 4X4 12PLY (GAUZE/BANDAGES/DRESSINGS) ×2 IMPLANT
SPONGE SURGIFOAM ABS GEL 100 (HEMOSTASIS) ×2 IMPLANT
STRIP CLOSURE SKIN 1/2X4 (GAUZE/BANDAGES/DRESSINGS) ×6 IMPLANT
SUT VIC AB 0 CT1 18XCR BRD8 (SUTURE) ×2 IMPLANT
SUT VIC AB 0 CT1 8-18 (SUTURE) ×8
SUT VIC AB 2-0 CT1 18 (SUTURE) ×5 IMPLANT
SUT VIC AB 3-0 CP2 18 (SUTURE) IMPLANT
SUT VIC AB 3-0 SH 8-18 (SUTURE) ×6 IMPLANT
SYR 20ML ECCENTRIC (SYRINGE) ×2 IMPLANT
TAPE CLOTH SURG 4X10 WHT LF (GAUZE/BANDAGES/DRESSINGS) ×1 IMPLANT
TOWEL OR 17X24 6PK STRL BLUE (TOWEL DISPOSABLE) ×2 IMPLANT
TOWEL OR 17X26 10 PK STRL BLUE (TOWEL DISPOSABLE) ×2 IMPLANT
TRAY FOLEY CATH 14FRSI W/METER (CATHETERS) ×2 IMPLANT
WATER STERILE IRR 1000ML POUR (IV SOLUTION) ×2 IMPLANT
straight hex rod ×2 IMPLANT

## 2011-09-29 NOTE — H&P (Signed)
Malik Zuniga is an 66 y.o. male.   Chief Complaint: Back pain HPI: A 66 year old male status post L2-S1 decompression and fusion with a severe painful junctional kyphoscoliosis failing conservative management the patient presents now for extension of his fusion to his mid thoracic spine in hopes of improving his symptoms. Patient has no new symptoms weakness and sensory loss or radicular pain.  Past Medical History  Diagnosis Date  . Myocardial infarction   . Hyperlipidemia   . Depression   . Emphysema of lung   . Tremor   . Headache, chronic daily   . History of prostate cancer   . Cancer 2004    prostate  . On home O2   . Aspiration pneumonia   . Stroke     "mini strokes"; denies residual  . Emphysema   . Neuromuscular disorder 1998    right carpal tunnel release  . Hypertension     Malik Zuniga    Past Surgical History  Procedure Date  . Cholecystectomy 1996  . 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 Jordan Zuniga  . Vascular stent 2000    Dr. Alanda Zuniga  . Hand surgery 1989    crushed thumb, Dr. Merlyn Zuniga  . Ulnar nerve repair 1998    left arm, Dr. Merlyn Zuniga  . Gastrostomy w/ feeding tube 2008    Dr. Ewing Zuniga  . 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 Prostatectomy 04/2001    removal of prostate cancer, Dr. Brunilda Zuniga  . Coronary angioplasty with stent placement 04/1998  . Carpal tunnel release 1998    right  . Back surgery 08/2004; 02/2005; 04/2006; 06/2007; 7/20101    all by Dr. Jordan Zuniga    Family History  Problem Relation Age of Onset  . Heart disease Mother   . Cancer Father     brain cancer and prostate cancer    Social History:  reports that he has been smoking Cigarettes.  He has a 25 pack-year smoking history. He has never used smokeless tobacco. He reports that he does not drink alcohol or use illicit drugs.  Allergies:  Allergies  Allergen Reactions  . Lisinopril Swelling    ANGIOEDEMA  . Lamictal (Lamotrigine) Other (See Comments)    Weakness/difficulty swallowing     Medications Prior to Admission  Medication Sig Dispense Refill  . acetaminophen (TYLENOL) 325 MG tablet Take 650 mg by mouth every 6 (six) hours as needed. For pain      . albuterol (PROVENTIL HFA;VENTOLIN HFA) 108 (90 BASE) MCG/ACT inhaler Inhale 2 puffs into the lungs every 6 (six) hours as needed. For shortness of breath      . ARIPiprazole (ABILIFY) 5 MG tablet Take 5 mg by mouth daily.      . Ascorbic Acid (VITAMIN C) 500 MG tablet Take 500 mg by mouth daily.       Marland Kitchen aspirin 81 MG EC tablet Take 81 mg by mouth daily.       Marland Kitchen aspirin-acetaminophen-caffeine (EXCEDRIN MIGRAINE) 250-250-65 MG per tablet Take 2 tablets by mouth every 6 (six) hours as needed. For headaches      . budesonide-formoterol (SYMBICORT) 160-4.5 MCG/ACT inhaler Inhale 2 puffs into the lungs 2 (two) times daily.  1 Inhaler  3  . cholecalciferol (VITAMIN D) 1000 UNITS tablet Take 1,000 Units by mouth daily.      . diazepam (VALIUM) 5 MG tablet Take 2.5-5 mg by mouth 3 (three) times daily. For anxiety (1 in the am, 1/2  around noon, 1 at night)      . divalproex (DEPAKOTE) 500 MG 24 hr tablet Take 1,000 mg by mouth at bedtime.       . docusate sodium (COLACE) 100 MG capsule Take 200 mg by mouth daily.      . DULoxetine (CYMBALTA) 60 MG capsule Take 60 mg by mouth daily.      . fluticasone (FLONASE) 50 MCG/ACT nasal spray Place 2 sprays into the nose daily.  16 g  3  . HYDROcodone-acetaminophen (VICODIN) 5-500 MG per tablet Take 2 tablets by mouth every 6 (six) hours as needed. For pain      . metoprolol succinate (TOPROL-XL) 50 MG 24 hr tablet Take 50 mg by mouth daily. Take with or immediately following a meal.      . nitroGLYCERIN (NITROSTAT) 0.4 MG SL tablet Place 0.4 mg under the tongue every 5 (five) minutes as needed. For chest pain      . olmesartan-hydrochlorothiazide (BENICAR HCT) 20-12.5 MG per tablet Take 1 tablet by mouth daily.      . Omega-3 Fatty Acids (FISH OIL TRIPLE STRENGTH) 1400 MG CAPS Take 1 capsule  by mouth daily.      Marland Kitchen omeprazole (PRILOSEC) 40 MG capsule Take 40 mg by mouth 2 (two) times daily.       Marland Kitchen PARoxetine (PAXIL) 30 MG tablet Take 15 mg by mouth every morning.      Marland Kitchen Phenyleph-CPM-DM-APAP (ALKA-SELTZER PLUS COLD & COUGH) 06-11-08-325 MG CAPS Take 2 tablets by mouth daily.       . polyethylene glycol (MIRALAX / GLYCOLAX) packet Take 17 g by mouth daily.      . primidone (MYSOLINE) 250 MG tablet Take 250 mg by mouth 2 (two) times daily.      . simvastatin (ZOCOR) 40 MG tablet Take 40 mg by mouth every evening.      . tiotropium (SPIRIVA HANDIHALER) 18 MCG inhalation capsule Place 1 capsule (18 mcg total) into inhaler and inhale daily.  30 capsule  6  . Zinc 50 MG TABS Take 50 mg by mouth daily.         No results found for this or any previous visit (from the past 48 hour(s)). No results found.  Review of Systems  Constitutional: Negative.   HENT: Negative.   Eyes: Negative.   Respiratory: Negative.   Cardiovascular: Negative.   Gastrointestinal: Negative.   Musculoskeletal: Negative.   Skin: Negative.   Neurological: Negative.   Endo/Heme/Allergies: Negative.   Psychiatric/Behavioral: Negative.     Blood pressure 113/79, pulse 93, temperature 97.6 F (36.4 C), temperature source Oral, resp. rate 20, SpO2 94.00%. Physical Exam  Constitutional: He is oriented to person, place, and time. He appears well-developed and well-nourished.  HENT:  Head: Normocephalic and atraumatic.  Right Ear: External ear normal.  Left Ear: External ear normal.  Nose: Nose normal.  Mouth/Throat: Oropharynx is clear and moist. No oropharyngeal exudate.  Eyes: Conjunctivae and EOM are normal. Pupils are equal, round, and reactive to light. Right eye exhibits no discharge. Left eye exhibits no discharge. No scleral icterus.  Neck: Normal range of motion. Neck supple. No JVD present. No tracheal deviation present. No thyromegaly present.  Cardiovascular: Normal rate, regular rhythm, normal  heart sounds and intact distal pulses.   No murmur heard. Respiratory: Effort normal and breath sounds normal. No stridor. No respiratory distress. He has no wheezes.  GI: Soft. Bowel sounds are normal. He exhibits no distension. There is no tenderness.  Musculoskeletal: Normal range of motion. He exhibits no edema and no tenderness.  Neurological: He is alert and oriented to person, place, and time. He displays normal reflexes. No cranial nerve deficit. He exhibits normal muscle tone. Coordination normal.  Skin: Skin is warm and dry. No rash noted. He is not diaphoretic. No erythema. No pallor.  Psychiatric: He has a normal mood and affect. His behavior is normal. Judgment and thought content normal.     Assessment/Plan Kyphoscoliosis junction only above the level of the previous L2-S1 fusion. Plan reexploration of his fusion with removal of hardware and extension of fusion to T7 utilizing segmental pedicle screws fixation. Risks and benefits of explained. Patient wishes to proceed.  Glenyce Randle A 09/29/2011, 9:38 AM

## 2011-09-29 NOTE — Anesthesia Preprocedure Evaluation (Addendum)
Anesthesia Evaluation  Patient identified by MRN, date of birth, ID band Patient awake    Reviewed: Allergy & Precautions, H&P , NPO status , Patient's Chart, lab work & pertinent test results, reviewed documented beta blocker date and time   History of Anesthesia Complications Negative for: history of anesthetic complications  Airway Mallampati: II TM Distance: >3 FB Neck ROM: Full    Dental  (+) Edentulous Upper, Edentulous Lower, Dental Advisory Given and Poor Dentition   Pulmonary pneumonia - (admit 02/07/11 with resp failure/COPD/PNA), resolved, COPD (nocturnal oxygen) COPD inhaler and oxygen dependent, Current Smoker,  breath sounds clear to auscultation  Pulmonary exam normal       Cardiovascular Exercise Tolerance: Poor hypertension, Pt. on medications and Pt. on home beta blockers + Past MI and + Cardiac Stents (RCA stent 2000) Rhythm:Regular Rate:Normal  '11 stress test: no ischemia, EF 61% 6/13 ECHO: EF 40-45%   Neuro/Psych  Headaches, PSYCHIATRIC DISORDERS Depression Bipolar Disorder Tremor Issues with memory--CVA vs meds? craniotomy of aneurysm with clipping in 1991? (one report of 1999) Chronic back pain: narcotics daily TIACVA (generalized weakness), No Residual Symptoms    GI/Hepatic Neg liver ROS, hiatal hernia, GERD-  Medicated and Controlled,Dysphagia--has a 4 year known history of aspiration with thin liquids, received a PEG tube in 09/2006, however had it removed in 10/2006 due to choosing PO with known aspiration instead    Endo/Other  negative endocrine ROS  Renal/GU negative Renal ROS  negative genitourinary   Musculoskeletal   Abdominal   Peds  Hematology negative hematology ROS (+)   Anesthesia Other Findings   Reproductive/Obstetrics                      Anesthesia Physical Anesthesia Plan  ASA: III  Anesthesia Plan: General   Post-op Pain Management:    Induction:  Intravenous  Airway Management Planned: Oral ETT  Additional Equipment:   Intra-op Plan:   Post-operative Plan: Extubation in OR  Informed Consent:   Dental advisory given  Plan Discussed with: CRNA, Anesthesiologist and Surgeon  Anesthesia Plan Comments: (Plan routine monitors, GETA)       Anesthesia Quick Evaluation

## 2011-09-29 NOTE — Brief Op Note (Signed)
09/29/2011  3:05 PM  PATIENT:  Malik Zuniga  66 y.o. male  PRE-OPERATIVE DIAGNOSIS:  KYPHOSIS  POST-OPERATIVE DIAGNOSIS:  Kyphosis  PROCEDURE:  Procedure(s) (LRB): POSTERIOR LUMBAR FUSION 4 LEVEL (N/A)  SURGEON:  Surgeon(s) and Role:    * Temple Pacini, MD - Primary    * Cristi Loron, MD - Assisting  PHYSICIAN ASSISTANT:   ASSISTANTS:    ANESTHESIA:   general  EBL:  Total I/O In: 4700 [I.V.:3750; Blood:200; IV Piggyback:750] Out: 1365 [Urine:715; Blood:650]  BLOOD ADMINISTERED:none  DRAINS: (Medium large) Hemovact drain(s) in the Epidural space with  Suction Open   LOCAL MEDICATIONS USED:  MARCAINE     SPECIMEN:  No Specimen  DISPOSITION OF SPECIMEN:  N/A  COUNTS:  YES  TOURNIQUET:  * No tourniquets in log *  DICTATION: .Dragon Dictation  PLAN OF CARE: Admit to inpatient   PATIENT DISPOSITION:  PACU - hemodynamically stable.   Delay start of Pharmacological VTE agent (>24hrs) due to surgical blood loss or risk of bleeding: yes

## 2011-09-29 NOTE — Anesthesia Postprocedure Evaluation (Signed)
  Anesthesia Post-op Note  Patient: Malik Zuniga  Procedure(s) Performed: Procedure(s) (LRB): POSTERIOR LUMBAR FUSION 4 LEVEL (N/A)  Patient Location: PACU  Anesthesia Type: General  Level of Consciousness: awake, alert  and oriented  Airway and Oxygen Therapy: Patient Spontanous Breathing and Patient connected to nasal cannula oxygen  Post-op Pain: mild  Post-op Assessment: Post-op Vital signs reviewed, Patient's Cardiovascular Status Stable, Respiratory Function Stable, Patent Airway, No signs of Nausea or vomiting and Pain level controlled  Post-op Vital Signs: Reviewed and stable  Complications: No apparent anesthesia complications

## 2011-09-29 NOTE — Progress Notes (Signed)
Called Dr. Jordan Likes to inform him he needs to sign his orders.

## 2011-09-29 NOTE — Transfer of Care (Signed)
Immediate Anesthesia Transfer of Care Note  Patient: Malik Zuniga  Procedure(s) Performed: Procedure(s) (LRB): POSTERIOR LUMBAR FUSION 4 LEVEL (N/A)  Patient Location: PACU  Anesthesia Type: General  Level of Consciousness: awake, alert , oriented and patient cooperative  Airway & Oxygen Therapy: Patient Spontanous Breathing and Patient connected to face mask oxygen  Post-op Assessment: Report given to PACU RN, Post -op Vital signs reviewed and stable and Patient moving all extremities X 4  Post vital signs: Reviewed and stable  Complications: No apparent anesthesia complications

## 2011-09-29 NOTE — Preoperative (Signed)
Beta Blockers   Reason not to administer Beta Blockers:Metoprolol 09/29/11 AM

## 2011-09-29 NOTE — Progress Notes (Signed)
Orthopedic Tech Progress Note Patient Details:  Malik Zuniga [birth year] [id number] Brace order fitted by [employer] vendor at 4:26 pm. Patient ID: Malik Zuniga, male   DOB: [birth year], 66 y.o.   MRN: [id number]   Malik Zuniga 09/29/2011, 7:34 PM

## 2011-09-29 NOTE — Op Note (Signed)
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  Postoperative diagnosis: Same  Procedure Name: Reexploration of lumbar fusion with removal of hardware from L2-S1.  Open reduction of deformity  T7-S1 posterior lateral segmental arthrodesis utilizing segmental pedicle screw instrumentation local autograft and bone graft extender  Surgeon:Garrie Elenes A.Jull Harral, M.D.  Asst. Surgeon: Tressie Stalker  Anesthesia: General  Indication: A 66 year old male with intractable back pain and severe kyphosis or scoliosis of his thoracic lumbar spine. Patient status post previous L2-S1 decompression and fusion. Patient with marked junctional kyphosis and severe pain leaving him essentially wheelchair-bound. Patient presents now for thoracic lumbar fusion in hopes of achieving better sagittal balance for pain relief and functional improvement.  Operative note: After induction of anesthesia, patient positioned prone onto Wilson frame and appropriately padded. Patient's thoracic and lumbar spine were prepped and draped sterilely. Incision was made extending from approximately T7 down to S1. Subperiosteal dissection was performed exposing the lamina facet joints and transverse processes from T7 down to S1. Previously placed pedicle screw fixation from L2-S1 was disassembled and removed. Screw heads were very proud on the right side at L2 and L3 and these screws were completely removed. All other screws were left in place except for the pedicle screw on the left at L2 which was laterally placed and was removed and not replaced. Entry points for pedicle screw fixation from T7-T12 was ascertained by AP and lateral fluoroscopy. Pilot holes were made. Tylenol is within probed using a pedicle awl in the pedicle of all tracts were probed and found to be solidly within  bone. 4.7 0.25 mm screw caps were used. Screw tunnels were probed and found to be solidly within bone. A 5.75 x 40 mm radius screws are placed bilaterally at T7 T8-T9 T10-T11. A 5.5 mm screw was placed on the right at T12. The pedicle on the left at T12 would not accept screw. She noted the patient has only 4 lumbar vertebra. With this in mind nomenclature is been difficult. He is uppermost lumbar vertebra is been referred to his L2 throughout. He will continue be referred to as this for now. It should be noted that no sedation was placed in L1 therefore. Fluoroscopy confirmed good position the screws in both the AP and lateral planes. Wounds and copious irrigated. Titanium rods were cut and contoured and placed in the screw heads from T7-S1. Locking caps is in place to the screw for locking catching engaged it with counter slight compression. Transverse connector was placed. Transverse processes and lamina of the thoracic spine were then decorticated. Morcellized autograft mixed with master graft bone graft extender was packed posteriorly for later fusion. The fusion mass from L2 down to S1 was very solid. This was decorticated somewhat and bridging bone was placed between the thoracic and lumbar spine. A medium large Hemovac drain was left in the upper space. Wounds and closed in layers of Vicryl stitch. Steri-Strips triggers were applied. There were no apparent outpatient rotation as well and he'll return sooner for a postop.

## 2011-09-30 LAB — BASIC METABOLIC PANEL
BUN: 9 mg/dL (ref 6–23)
CO2: 27 mEq/L (ref 19–32)
Chloride: 92 mEq/L — ABNORMAL LOW (ref 96–112)
GFR calc non Af Amer: >90 mL/min (ref >90)
Glucose, Bld: 120 mg/dL — ABNORMAL HIGH (ref 70–99)
Potassium: 4.1 mEq/L (ref 3.5–5.1)

## 2011-09-30 LAB — CBC
HCT: 28.7 % — ABNORMAL LOW (ref 39.0–52.0)
Hemoglobin: 9.9 g/dL — ABNORMAL LOW (ref 13.0–17.0)
MCH: 31.1 pg (ref 26.0–34.0)
MCHC: 34.5 g/dL (ref 30.0–36.0)
MCV: 90.3 fL (ref 78.0–100.0)

## 2011-09-30 NOTE — Progress Notes (Signed)
UR COMPLETED  

## 2011-09-30 NOTE — Evaluation (Signed)
Physical Therapy Evaluation Patient Details Name: Malik Zuniga MRN: [id number] DOB: Jul 07, 1945 Today's Date: 09/30/2011 Time: 6295-2841 PT Time Calculation (min): 40 min  PT Assessment / Plan / Recommendation Clinical Impression  Malik Zuniga is 66 y/o male  L2-S1 decompression and fusion. Presents to PT today with limited mobilty secondary to pain and weakness following surgery. Will benefit physical therapy in the acute setting to address these and the below deficits so as to maximize functional independence and safety for d/c home with assist from spouse. Rec HHPT for f/u. No DME needs identified.     PT Assessment  Patient needs continued PT services    Follow Up Recommendations  Home health PT    Barriers to Discharge        Equipment Recommendations  None recommended by PT    Recommendations for Other Services OT consult   Frequency Min 5X/week    Precautions / Restrictions Precautions Precautions: Back Required Braces or Orthoses: Spinal Brace Spinal Brace: Thoracolumbosacral orthotic;Applied in sitting position   Pertinent Vitals/Pain Pt easily nauseated today      Mobility  Bed Mobility Bed Mobility: Rolling Left;Left Sidelying to Sit Rolling Left: 4: Min assist;With rail Right Sidelying to Sit: 4: Min assist;HOB elevated;With rails (30 degrees) Details for Bed Mobility Assistance: min tactile and verbal cues to sequence and follow through Transfers Transfers: Sit to Stand;Stand to Sit Sit to Stand: 4: Min assist;From elevated surface;From bed Stand to Sit: 4: Min assist;To chair/3-in-1;With upper extremity assist;With armrests Details for Transfer Assistance: cues for safe hand placement and facilitation for follow through to stand and stabilize Ambulation/Gait Ambulation/Gait Assistance: 4: Min assist Ambulation Distance (Feet): 6 Feet Assistive device: Rolling walker Ambulation/Gait Assistance Details: cues for upright standing and negotiation of RW Gait  Pattern: Trunk flexed General Gait Details: slight shift of his hips to the left with a lateral lean right    Exercises     PT Diagnosis: Difficulty walking;Abnormality of gait;Generalized weakness;Acute pain  PT Problem List: Decreased strength;Decreased activity tolerance;Decreased mobility;Pain PT Treatment Interventions: DME instruction;Gait training;Stair training;Functional mobility training;Therapeutic activities;Therapeutic exercise;Neuromuscular re-education;Patient/family education   PT Goals Acute Rehab PT Goals PT Goal Formulation: With patient Time For Goal Achievement: 10/07/11 Potential to Achieve Goals: Good Pt will Roll Supine to Right Side: with modified independence PT Goal: Rolling Supine to Right Side - Progress: Goal set today Pt will Roll Supine to Left Side: with modified independence PT Goal: Rolling Supine to Left Side - Progress: Goal set today Pt will go Supine/Side to Sit: with modified independence PT Goal: Supine/Side to Sit - Progress: Goal set today Pt will go Sit to Supine/Side: with modified independence PT Goal: Sit to Supine/Side - Progress: Goal set today Pt will go Sit to Stand: with modified independence PT Goal: Sit to Stand - Progress: Goal set today Pt will go Stand to Sit: with modified independence PT Goal: Stand to Sit - Progress: Goal set today Pt will Transfer Bed to Chair/Chair to Bed: with modified independence PT Transfer Goal: Bed to Chair/Chair to Bed - Progress: Goal set today Pt will Ambulate: >150 feet;with modified independence;with least restrictive assistive device PT Goal: Ambulate - Progress: Goal set today Pt will Go Up / Down Stairs: 6-9 stairs;with min assist;with rail(s) PT Goal: Up/Down Stairs - Progress: Goal set today Additional Goals Additional Goal #1: Pt will verbalize and demonstrate knowledge of 3/3 back precautions.  PT Goal: Additional Goal #1 - Progress: Goal set today  Visit Information  Last PT  Received  On: 09/30/11 Assistance Needed: +1    Subjective Data  Subjective: This is a bit early to be doing this but OK.    Prior Functioning  Home Living Lives With: Spouse Available Help at Discharge: Family;Available 24 hours/day (supervision/minA only) Type of Home: House Home Access: Stairs to enter [employer] of Steps: 8 Entrance Stairs-Rails: Left Home Layout: One level Bathroom Shower/Tub: Tub/shower unit Home Adaptive Equipment: Bedside commode/3-in-1;Walker - rolling;Tub transfer bench Prior Function Level of Independence: Independent with assistive device(s) Able to Take Stairs?: Yes Vocation: Retired Musician: No difficulties    Cognition  Overall Cognitive Status: Appears within functional limits for tasks assessed/performed Arousal/Alertness: Awake/alert Orientation Level: Appears intact for tasks assessed Behavior During Session: [facility] for tasks performed    Extremity/Trunk Assessment Right Upper Extremity Assessment RUE ROM/Strength/Tone: [facility] for tasks assessed Left Upper Extremity Assessment LUE ROM/Strength/Tone: WFL for tasks assessed Right Lower Extremity Assessment RLE ROM/Strength/Tone: Deficits RLE ROM/Strength/Tone Deficits: generally weak, grossly 4/5 RLE Sensation: WFL - Light Touch Left Lower Extremity Assessment LLE ROM/Strength/Tone: Deficits LLE ROM/Strength/Tone Deficits: generally weak, grossly 4/5 LLE Sensation: WFL - Light Touch   Balance    End of Session PT - End of Session Equipment Utilized During Treatment: Gait belt Activity Tolerance: Patient tolerated treatment well;Patient limited by fatigue;Other (comment) (nausea) Patient left: in chair;with call bell/phone within reach;with family/visitor present Nurse Communication: Mobility status;Other (comment);Precautions (request for nausea med)  GP     [facility] Malik Zuniga 09/30/2011, 9:07 AM

## 2011-09-30 NOTE — Progress Notes (Signed)
Postop day 1. Pain well controlled. Overall he says he feels better than preop. Denies any radiating numbness paresthesias or weakness. No shortness of breath.  Awake and alert. Oriented and appropriate. Afebrile. Mildly tachycardic. Blood pressure normal. Drain output high. Hematocrit 28.7. Watch the sodium at 126. Motor and sensory function of the extremities normal. Dressing dry. Participate in therapy and was able to stand and walk at the bedside with minimal assistance.  Doing well status post thoracic/lumbar fusion. Continue efforts at mobilization.

## 2011-10-01 LAB — CBC
HCT: 26.8 % — ABNORMAL LOW (ref 39.0–52.0)
MCH: 31.8 pg (ref 26.0–34.0)
MCHC: 35.4 g/dL (ref 30.0–36.0)
MCV: 89.6 fL (ref 78.0–100.0)
Platelets: 200 10*3/uL (ref 150–400)
RDW: 13.4 % (ref 11.5–15.5)

## 2011-10-01 LAB — BASIC METABOLIC PANEL
BUN: 9 mg/dL (ref 6–23)
Calcium: 8.9 mg/dL (ref 8.4–10.5)
Creatinine, Ser: 0.71 mg/dL (ref 0.50–1.35)
GFR calc Af Amer: >90 mL/min (ref >90)

## 2011-10-01 MED ORDER — SODIUM CHLORIDE 0.9 % IV BOLUS (SEPSIS)
500.0000 mL | Freq: Once | INTRAVENOUS | Status: AC
  Administered 2011-10-01: 500 mL via INTRAVENOUS

## 2011-10-01 MED FILL — Sodium Chloride IV Soln 0.9%: INTRAVENOUS | Qty: 1000 | Status: AC

## 2011-10-01 MED FILL — Heparin Sodium (Porcine) Inj 1000 Unit/ML: INTRAMUSCULAR | Qty: 30 | Status: AC

## 2011-10-01 MED FILL — Sodium Chloride Irrigation Soln 0.9%: Qty: 3000 | Status: AC

## 2011-10-01 NOTE — Progress Notes (Signed)
I have read and agree with the below treatment note and plan. Caraline Deutschman Helen Whitlow PT, DPT Pager: [phone] 

## 2011-10-01 NOTE — Progress Notes (Signed)
Postop day 2. Patient notes he is a little bit more sore today. Still free from any radicular pain weakness or numbness.  He is afebrile. Mildly tachycardic. Blood pressure acceptable. Urine output is good. Drain output still moderately high. Neurologically he is awake and alert oriented and appropriate. Motor and sensory function extremities is normal. Wound clean dry and intact. Chest and abdomen benign.  Progressing well. Continue efforts at mobilization. Continue drain for now. We'll check followup labs.

## 2011-10-01 NOTE — Evaluation (Signed)
Occupational Therapy Evaluation Patient Details Name: Malik Zuniga MRN: [id number] DOB: 1945-02-12 Today's Date: 10/01/2011 Time: 4782-9562 OT Time Calculation (min): 35 min  OT Assessment / Plan / Recommendation Clinical Impression  This 66 y.o. male admitted for L2-S1 decompression and fusion.  Eval limited by decreased BP - see vitals for details.  Pt. denies dizziness.  Pt. will benefit from OT to maximize safety and independence with BADLs to allow pt. to return to home with supervision to min A from wife    OT Assessment  Patient needs continued OT Services    Follow Up Recommendations  Supervision/Assistance - 24 hour;No OT follow up    Barriers to Discharge None    Equipment Recommendations  None recommended by OT    Recommendations for Other Services    Frequency  Min 2X/week    Precautions / Restrictions Precautions Precautions: Back Precaution Comments: Pt. requires min verbal cues to adhere to back precautions Required Braces or Orthoses: Spinal Brace Spinal Brace: Thoracolumbosacral orthotic;Applied in sitting position Restrictions Weight Bearing Restrictions: No       ADL  Eating/Feeding: Simulated;Independent Where Assessed - Eating/Feeding: Chair Grooming: Simulated;Wash/dry hands;Wash/dry face;Teeth care;Set up Where Assessed - Grooming: Supported sitting Upper Body Bathing: Simulated;Moderate assistance Where Assessed - Upper Body Bathing: Supported sitting Lower Body Bathing: Simulated;Moderate assistance Where Assessed - Lower Body Bathing: Supported sit to stand Upper Body Dressing: Simulated;Minimal assistance Where Assessed - Upper Body Dressing: Supported sitting Lower Body Dressing: Performed;Moderate assistance Where Assessed - Lower Body Dressing: Supported sit to Pharmacist, hospital: Mining engineer Method: Sit to Barista: Comfort height toilet Toileting - Clothing Manipulation and  Hygiene: Simulated;Moderate assistance Where Assessed - Toileting Clothing Manipulation and Hygiene: Standing Transfers/Ambulation Related to ADLs: Pt. ambulates with Min A and verbal cues for back precautions and walker placement ADL Comments: Pt. able to don/doff socks by crossing ankles over knees with mod A.  Pt. leans heavily to Rt. and requires min-mod verbal cues to correct    OT Diagnosis: Generalized weakness;Acute pain  OT Problem List: Decreased strength;Decreased activity tolerance;Impaired balance (sitting and/or standing);Decreased knowledge of use of DME or AE;Decreased knowledge of precautions;Pain OT Treatment Interventions: Self-care/ADL training;DME and/or AE instruction;Therapeutic activities;Patient/family education;Balance training   OT Goals Acute Rehab OT Goals OT Goal Formulation: With patient Time For Goal Achievement: 10/08/11 Potential to Achieve Goals: Good ADL Goals Pt Will Perform Grooming: with supervision;Standing at sink ADL Goal: Grooming - Progress: Goal set today Pt Will Perform Upper Body Bathing: with set-up;Sitting, chair ADL Goal: Upper Body Bathing - Progress: Goal set today Pt Will Perform Lower Body Bathing: with supervision;Sit to stand from chair;Sit to stand from bed ADL Goal: Lower Body Bathing - Progress: Goal set today Pt Will Perform Lower Body Dressing: with supervision;Sit to stand from chair;Sit to stand from bed;with adaptive equipment ADL Goal: Lower Body Dressing - Progress: Goal set today Pt Will Perform Toileting - Clothing Manipulation: with supervision;Standing ADL Goal: Toileting - Clothing Manipulation - Progress: Goal set today Pt Will Perform Tub/Shower Transfer: with supervision;Transfer tub bench;Ambulation ADL Goal: Tub/Shower Transfer - Progress: Goal set today  Visit Information  Last OT Received On: 10/01/11 Assistance Needed: +1    Subjective Data  Subjective: "They keep telling me I'm leaning to the Rt.  I guess  I am" Patient Stated Goal: To get stronger   Prior Functioning  Vision/Perception  Home Living Lives With: Spouse Available Help at Discharge: Family;Available 24 hours/day Type of Home: House  Home Access: Stairs to enter Entrance Stairs-Number of Steps: 8 Entrance Stairs-Rails: Left Home Layout: One level Bathroom Shower/Tub: Forensic scientist: Standard Bathroom Accessibility: Yes How Accessible: Accessible via walker Home Adaptive Equipment: Bedside commode/3-in-1;Walker - rolling;Tub transfer bench;Grab bars in shower;Grab bars around toilet Prior Function Level of Independence: Independent with assistive device(s) Able to Take Stairs?: Yes Vocation: Retired Musician: No difficulties (Pt. with low volume) Dominant Hand: Right      Cognition  Overall Cognitive Status: Appears within functional limits for tasks assessed/performed Arousal/Alertness: Awake/alert Orientation Level: Appears intact for tasks assessed Behavior During Session: [facility] for tasks performed    Extremity/Trunk Assessment Right Upper Extremity Assessment RUE ROM/Strength/Tone: WFL for tasks assessed RUE Coordination: WFL - gross/fine motor Left Upper Extremity Assessment LUE ROM/Strength/Tone: WFL for tasks assessed LUE Coordination: WFL - gross/fine motor   Mobility Bed Mobility Bed Mobility: Sit to Sidelying Left;Scooting to HOB Rolling Left: 4: Min assist Left Sidelying to Sit: 4: Min assist;HOB elevated Sit to Sidelying Left: 3: Mod assist;With rail Scooting to HOB: 1: +2 Total assist Scooting to HOB: Patient Percentage: 20% Details for Bed Mobility Assistance: Pt. requires assist for LEs and mod verbal cues to hand placement Transfers Transfers: Sit to Stand;Stand to Sit Sit to Stand: 4: Min assist;With upper extremity assist;From chair/3-in-1 Stand to Sit: 4: Min assist;With upper extremity assist;To bed Details for Transfer Assistance: Pt requires  verbal cueing for safe hand placement and min assist for safety.   Exercise    Balance    End of Session OT - End of Session Equipment Utilized During Treatment: Back brace Activity Tolerance: Other (comment) (Low BP) Patient left: in bed;with nursing in room;with call bell/phone within reach Nurse Communication: Other (comment) (low BP)  GO     Darvis Croft M 10/01/2011, 4:32 PM

## 2011-10-01 NOTE — Progress Notes (Signed)
PT Cancellation Note  Treatment cancelled today due to medical issues with patient which prohibited therapy. Pt with low BP. Will check back this pm as time allows.   [facility] HELEN 10/01/2011, 11:11 AM Pager: [phone]

## 2011-10-01 NOTE — Progress Notes (Signed)
Physical Therapy Treatment Patient Details Name: TRENTAN TRIPPE MRN: [id number] DOB: 1945-02-14 Today's Date: 10/01/2011 Time: 4098-1191 PT Time Calculation (min): 22 min  PT Assessment / Plan / Recommendation Comments on Treatment Session  Pt continues to have low BP but was cleared to be mobilized by MD per nursing. Pt's BP monitored throughout session with 95/66 when transitioning from supine to sit, sitting EOB: 121/68, and seated in chair after ambulating approx 6 feet: 114/64. Pt complained of nausea, but reports it "is not as bad as yesterday." Pt encouraged to perfrom ankle pumps while sitting in chair to encourage good blood flow and pt left with SCDs in place.    Follow Up Recommendations  Home health PT; supervision for mobility/OOB    Barriers to Discharge        Equipment Recommendations  None recommended by PT    Recommendations for Other Services    Frequency Min 5X/week   Plan      Precautions / Restrictions Precautions Precautions: Back Required Braces or Orthoses: Spinal Brace Spinal Brace: Thoracolumbosacral orthotic;Applied in sitting position Restrictions Weight Bearing Restrictions: No   Pertinent Vitals/Pain Pt's BP monitored throughout session with 95/66 when transitioning from supine to sit, sitting EOB: 121/68, and seated in chair after ambulating approx 6 feet: 114/64.     Mobility  Bed Mobility Bed Mobility: Rolling Left;Left Sidelying to Sit;Sitting - Scoot to [place] of Bed Rolling Left: 4: Min assist Left Sidelying to Sit: 4: Min assist;HOB elevated Details for Bed Mobility Assistance: Pt required min assist for bed mobility and sequencing for maintaining back precautions for log rolling. Transfers Transfers: Sit to Stand;Stand to Sit Sit to Stand: 4: Min assist;From elevated surface;From bed;With upper extremity assist Stand to Sit: 4: Min assist;To chair/3-in-1;With upper extremity assist Details for Transfer Assistance: Pt requires verbal cueing  for safe hand placement and min assist for safety. Pt required facilitation at R upper trunk to bring pt more to midline and correct R lateral lean, pt fatigues quickly losing this posture. Ambulation/Gait Ambulation/Gait Assistance: 4: Min assist Ambulation Distance (Feet): 5 Feet Assistive device: Rolling walker Ambulation/Gait Assistance Details: Pt requires verbal cues for upright stance and min assist for safety due to decreased mobility and balance. Gait Pattern: Trunk flexed;Lateral trunk lean to right Stairs: No     PT Goals Acute Rehab PT Goals PT Goal: Rolling Supine to Left Side - Progress: Progressing toward goal PT Goal: Supine/Side to Sit - Progress: Progressing toward goal PT Goal: Sit to Stand - Progress: Progressing toward goal PT Goal: Stand to Sit - Progress: Progressing toward goal PT Transfer Goal: Bed to Chair/Chair to Bed - Progress: Progressing toward goal Additional Goals PT Goal: Additional Goal #1 - Progress: Progressing toward goal (able to verbalize 2/3)  Visit Information  Last PT Received On: 10/01/11 Assistance Needed: +1    Subjective Data  Subjective: "I am doing better now."   Cognition  Overall Cognitive Status: Appears within functional limits for tasks assessed/performed Arousal/Alertness: Awake/alert Orientation Level: Appears intact for tasks assessed Behavior During Session: [facility] for tasks performed    Balance     End of Session PT - End of Session Equipment Utilized During Treatment: Gait belt;Back brace Activity Tolerance: Patient tolerated treatment well;Patient limited by fatigue Patient left: in chair;with call bell/phone within reach Nurse Communication: Mobility status   GP     Roslynn Holte 10/01/2011, 3:34 PM

## 2011-10-01 NOTE — Progress Notes (Signed)
PHARMACIST - PHYSICIAN ORDER COMMUNICATION  CONCERNING: P&T Medication Policy on Herbal Medications  DESCRIPTION:  This patient's order for:  alkaseltzer cold (phenyleph CPM DM APAP 10-325 mg)  has been noted.  This product(s) is classified as an "herbal" or natural product. Due to a lack of definitive safety studies or FDA approval, nonstandard manufacturing practices, plus the potential risk of unknown drug-drug interactions while on inpatient medications, the Pharmacy and Therapeutics Committee does not permit the use of "herbal" or natural products of this type within [facility].   ACTION TAKEN: The pharmacy department is unable to verify this order at this time and your patient has been informed of this safety policy. Please reevaluate patient's clinical condition at discharge and address if the herbal or natural product(s) should be resumed at that time.  Herby Abraham, Pharm.D. [phone] 10/01/2011 11:28 AM

## 2011-10-01 NOTE — Progress Notes (Signed)
1100 Pt blood pressure 76/64 pulse 1`24bpm. Dr pool called. Per Dr. Jordan Likes hold HCTZ and give 500cc bolus of NS.

## 2011-10-02 ENCOUNTER — Encounter (HOSPITAL_COMMUNITY): Payer: Self-pay | Admitting: Radiology

## 2011-10-02 ENCOUNTER — Encounter (HOSPITAL_COMMUNITY): Payer: Self-pay

## 2011-10-02 ENCOUNTER — Inpatient Hospital Stay (HOSPITAL_COMMUNITY): Payer: Medicare Other

## 2011-10-02 DIAGNOSIS — J9601 Acute respiratory failure with hypoxia: Secondary | ICD-10-CM | POA: Diagnosis not present

## 2011-10-02 DIAGNOSIS — J449 Chronic obstructive pulmonary disease, unspecified: Secondary | ICD-10-CM

## 2011-10-02 DIAGNOSIS — I10 Essential (primary) hypertension: Secondary | ICD-10-CM

## 2011-10-02 DIAGNOSIS — J9600 Acute respiratory failure, unspecified whether with hypoxia or hypercapnia: Secondary | ICD-10-CM

## 2011-10-02 DIAGNOSIS — A419 Sepsis, unspecified organism: Secondary | ICD-10-CM | POA: Diagnosis not present

## 2011-10-02 DIAGNOSIS — J4489 Other specified chronic obstructive pulmonary disease: Secondary | ICD-10-CM

## 2011-10-02 LAB — CBC
HCT: 24.6 % — ABNORMAL LOW (ref 39.0–52.0)
MCH: 31.5 pg (ref 26.0–34.0)
MCV: 88.2 fL (ref 78.0–100.0)
Platelets: 177 10*3/uL (ref 150–400)
RBC: 2.79 MIL/uL — ABNORMAL LOW (ref 4.22–5.81)
WBC: 3.6 10*3/uL — ABNORMAL LOW (ref 4.0–10.5)

## 2011-10-02 LAB — PROCALCITONIN: Procalcitonin: 3.13 ng/mL

## 2011-10-02 LAB — BLOOD GAS, ARTERIAL
Acid-Base Excess: 2 mmol/L (ref 0.0–2.0)
Bicarbonate: 24.6 mEq/L — ABNORMAL HIGH (ref 20.0–24.0)
Drawn by: 28120
FIO2: 0.44 %
O2 Saturation: 99.9 %
Patient temperature: 98.6
TCO2: 25.5 mmol/L (ref 0–100)
pCO2 arterial: 29.3 mmHg — ABNORMAL LOW (ref 35.0–45.0)
pH, Arterial: 7.533 — ABNORMAL HIGH (ref 7.350–7.450)
pO2, Arterial: 105 mmHg — ABNORMAL HIGH (ref 80.0–100.0)

## 2011-10-02 LAB — COMPREHENSIVE METABOLIC PANEL
AST: 56 U/L — ABNORMAL HIGH (ref 0–37)
BUN: 8 mg/dL (ref 6–23)
CO2: 26 mEq/L (ref 19–32)
Calcium: 9 mg/dL (ref 8.4–10.5)
Chloride: 93 mEq/L — ABNORMAL LOW (ref 96–112)
Creatinine, Ser: 0.53 mg/dL (ref 0.50–1.35)
GFR calc Af Amer: >90 mL/min (ref >90)
GFR calc non Af Amer: >90 mL/min (ref >90)
Total Bilirubin: 0.2 mg/dL — ABNORMAL LOW (ref 0.3–1.2)

## 2011-10-02 LAB — URINALYSIS, ROUTINE W REFLEX MICROSCOPIC
Glucose, UA: NEGATIVE mg/dL
Leukocytes, UA: NEGATIVE
Nitrite: NEGATIVE
Specific Gravity, Urine: >1.046 — ABNORMAL HIGH (ref 1.005–1.030)
pH: 7.5 (ref 5.0–8.0)

## 2011-10-02 LAB — LACTIC ACID, PLASMA: Lactic Acid, Venous: 1.9 mmol/L (ref 0.5–2.2)

## 2011-10-02 LAB — CARDIAC PANEL(CRET KIN+CKTOT+MB+TROPI)
CK, MB: 2.4 ng/mL (ref 0.3–4.0)
Total CK: 377 U/L — ABNORMAL HIGH (ref 7–232)
Troponin I: <0.3 ng/mL (ref <0.30)

## 2011-10-02 LAB — D-DIMER, QUANTITATIVE: D-Dimer, Quant: 6.96 ug/mL-FEU — ABNORMAL HIGH (ref 0.00–0.48)

## 2011-10-02 MED ORDER — SODIUM CHLORIDE 0.9 % IV BOLUS (SEPSIS)
1000.0000 mL | Freq: Once | INTRAVENOUS | Status: AC
  Administered 2011-10-02: 1000 mL via INTRAVENOUS

## 2011-10-02 MED ORDER — VANCOMYCIN HCL IN DEXTROSE 1-5 GM/200ML-% IV SOLN
1000.0000 mg | Freq: Once | INTRAVENOUS | Status: AC
  Administered 2011-10-02: 1000 mg via INTRAVENOUS
  Filled 2011-10-02: qty 200

## 2011-10-02 MED ORDER — OXYCODONE-ACETAMINOPHEN 5-325 MG PO TABS: 1.0000 | ORAL_TABLET | ORAL | Status: AC | PRN

## 2011-10-02 MED ORDER — DIAZEPAM 5 MG PO TABS: 5.0000 mg | ORAL_TABLET | Freq: Four times a day (QID) | ORAL | Status: AC | PRN

## 2011-10-02 MED ORDER — ALBUTEROL SULFATE (5 MG/ML) 0.5% IN NEBU
INHALATION_SOLUTION | RESPIRATORY_TRACT | Status: AC
  Administered 2011-10-02: 19:00:00
  Filled 2011-10-02: qty 0.5

## 2011-10-02 MED ORDER — IOHEXOL 350 MG/ML SOLN
100.0000 mL | Freq: Once | INTRAVENOUS | Status: AC | PRN
  Administered 2011-10-02: 100 mL via INTRAVENOUS

## 2011-10-02 MED ORDER — DIAZEPAM 5 MG PO TABS: 5.0000 mg | ORAL_TABLET | Freq: Every day | ORAL | Status: AC

## 2011-10-02 MED ORDER — PIPERACILLIN-TAZOBACTAM 3.375 G IVPB
3.3750 g | Freq: Three times a day (TID) | INTRAVENOUS | Status: DC
  Administered 2011-10-02 – 2011-10-06 (×11): 3.375 g via INTRAVENOUS
  Filled 2011-10-02 (×15): qty 50

## 2011-10-02 MED ORDER — NALOXONE HCL 0.4 MG/ML IJ SOLN
INTRAMUSCULAR | Status: AC
  Filled 2011-10-02: qty 1

## 2011-10-02 MED ORDER — SODIUM CHLORIDE 0.9 % IV SOLN
INTRAVENOUS | Status: DC
  Administered 2011-10-03: 05:00:00 via INTRAVENOUS

## 2011-10-02 MED ORDER — SODIUM CHLORIDE 0.9 % IV BOLUS (SEPSIS)
500.0000 mL | Freq: Once | INTRAVENOUS | Status: AC
  Administered 2011-10-02: 500 mL via INTRAVENOUS

## 2011-10-02 MED ORDER — SODIUM CHLORIDE 0.9 % IV SOLN
750.0000 mg | Freq: Three times a day (TID) | INTRAVENOUS | Status: DC
  Administered 2011-10-03 – 2011-10-04 (×5): 750 mg via INTRAVENOUS
  Filled 2011-10-02 (×8): qty 750

## 2011-10-02 MED ORDER — LORATADINE 10 MG PO TABS
10.0000 mg | ORAL_TABLET | Freq: Every day | ORAL | Status: DC
  Administered 2011-10-03 – 2011-10-06 (×4): 10 mg via ORAL
  Filled 2011-10-02 (×6): qty 1

## 2011-10-02 MED ORDER — PIPERACILLIN-TAZOBACTAM 3.375 G IVPB: 3.3750 g | Freq: Four times a day (QID) | INTRAVENOUS | Status: DC

## 2011-10-02 NOTE — Progress Notes (Signed)
Patient was seen around 5pm on normal rounding to be weak and having abdominal breathing. Vitals checked were  T.98.2, p143, SPO2 89% RA, bp134/64. Rapid response was called to see the patient since there was a change from his baseline, MD on call was also notified and orders given were carried out. Significant other has been notified and patient transfer to the ICU for observation.

## 2011-10-02 NOTE — Progress Notes (Signed)
Subjective: Called by 4 N. nursing staff late this afternoon while in the operating room because patient developed respiratory difficulty, with shallow breathing. Rapid response nursing team was called by 4 N. nursing staff and assessed patient. They felt the patient's breathing was insufficient. They felt the patient was going to require at minimum BiPAp. I asked the rapid response staff to transfer the patient to the neurosurgical intensive care unit and consult critical care medicine for assistance in this patient's management.  Patient was seen by Dr. Cyril Mourning from CCM, and the patient was transferred to the NSICU.  Objective: Vital signs in last 24 hours: Filed Vitals:   10/02/11 1429 10/02/11 1549 10/02/11 1647 10/02/11 1800  BP: 93/59  151/77 121/65  Pulse: 96  125 141  Temp: 98.4 F (36.9 C) 99.5 F (37.5 C) 97.7 F (36.5 C) 101 F (38.3 C)  TempSrc: Oral  Oral Axillary  Resp: 20     Height:      Weight:      SpO2: 93%  88% 100%    Intake/Output from previous day: 08/21 0701 - 08/22 0700 In: 1183 [P.O.:680; I.V.:3; IV Piggyback:500] Out: 580 [Urine:350; Drains:230] Intake/Output this shift:    Physical Exam:  Patient with BiPAP in place. Opening eyes to voice. Following commands with all 4 extremities.  CBC  Basename 10/02/11 1830 10/01/11 1030  WBC 3.6* 14.4*  HGB 8.8* 9.5*  HCT 24.6* 26.8*  PLT 177 200   BMET  Basename 10/01/11 1030 09/30/11 0630  NA 128* 126*  K 3.8 4.1  CL 94* 92*  CO2 24 27  GLUCOSE 122* 120*  BUN 9 9  CREATININE 0.71 0.60  CALCIUM 8.9 8.3*   ABG    Component Value Date/Time   PHART 7.533* 10/02/2011 1758   PCO2ART 29.3* 10/02/2011 1758   PO2ART 105.0* 10/02/2011 1758   HCO3 24.6* 10/02/2011 1758   TCO2 25.5 10/02/2011 1758   O2SAT 99.9 10/02/2011 1758    Studies/Results: Dg Chest Port 1 View  10/02/2011  *RADIOLOGY REPORT*  Clinical Data: Severe dyspnea.  PORTABLE CHEST - 1 VIEW  Comparison: Previous examinations.  Findings:  Normal sized heart.  Minimal linear density at the left lung base.  Otherwise, clear lungs.  Thoracolumbar pedicle screws and rods.  Mild scoliosis. Tubing overlying the left chest.  IMPRESSION: Minimal linear atelectasis or scarring at the left lung base.   Original Report Authenticated By: Darrol Angel, M.D.     Assessment/Plan: Currently undergoing workup for the direction of CCM. Appreciate the assistance of rapid response and CCM. Spoke with Dr. Jordan Likes by phone, and updated on the patient's condition. He is out of town until August 26, and I updated Dr. Donalee Citrin, who is covering for Dr. Jordan Likes, until he returns. Spoke with patient, his wife, and his son at the bedside. Discussed the events of the past few hours, and her plans for evaluation, treatment, and care. Their questions were answered.  Hewitt Shorts, MD 10/02/2011, 7:18 PM

## 2011-10-02 NOTE — Progress Notes (Signed)
Physical Therapy Treatment Patient Details Name: Malik Zuniga MRN: [id number] DOB: [birth year] Today's Date: 10/02/2011 Time: 1010-1035 PT Time Calculation (min): 25 min  PT Assessment / Plan / Recommendation Comments on Treatment Session  Patient progressing well with ambulation. Noted possible DC tomorrow. Patient has several steps to enter house. Will need to ensure patient can complete safely prior to DC home    Follow Up Recommendations  Home health PT    Barriers to Discharge        Equipment Recommendations  None recommended by PT    Recommendations for Other Services    Frequency Min 5X/week   Plan Discharge plan remains appropriate;Frequency remains appropriate    Precautions / Restrictions Precautions Precautions: Back Precaution Comments: Patient able to recall all precautions. Requiring cues throughout for no bending with upright posture Required Braces or Orthoses: Spinal Brace Spinal Brace: Applied in sitting position;Thoracolumbosacral orthotic   Pertinent Vitals/Pain     Mobility  Bed Mobility Left Sidelying to Sit: 4: Min assist;With rails Sitting - Scoot to Edge of Bed: 5: Supervision Details for Bed Mobility Assistance: Cues for log roll technique. Patient with heavy reliance on rails. Patient stated that MD was going to order hospital bed for home use Transfers Sit to Stand: 4: Min assist;With upper extremity assist;From bed Stand to Sit: 4: Min assist;With upper extremity assist;To chair/3-in-1 Details for Transfer Assistance: A to initiate stand and to ensure balance. Cues for safe hand placement and positioning prior to sitting Ambulation/Gait Ambulation/Gait Assistance: 4: Min guard Ambulation Distance (Feet): 225 Feet Assistive device: Rolling walker Ambulation/Gait Assistance Details: Patient requiring max cues throughout for upright posture Gait Pattern: Step-through pattern;Decreased stride length;Trunk flexed    Exercises     PT Diagnosis:     PT Problem List:   PT Treatment Interventions:     PT Goals Acute Rehab PT Goals PT Goal: Supine/Side to Sit - Progress: Progressing toward goal PT Goal: Sit to Stand - Progress: Progressing toward goal PT Goal: Stand to Sit - Progress: Progressing toward goal PT Transfer Goal: Bed to Chair/Chair to Bed - Progress: Progressing toward goal PT Goal: Ambulate - Progress: Progressing toward goal Additional Goals PT Goal: Additional Goal #1 - Progress: Progressing toward goal  Visit Information  Last PT Received On: 10/02/11 Assistance Needed: +1    Subjective Data      Cognition  Overall Cognitive Status: Appears within functional limits for tasks assessed/performed Arousal/Alertness: Awake/alert Orientation Level: Appears intact for tasks assessed Behavior During Session: [facility] for tasks performed    Balance     End of Session PT - End of Session Equipment Utilized During Treatment: Gait belt;Back brace Activity Tolerance: Patient tolerated treatment well Patient left: in chair;with call bell/phone within reach Nurse Communication: Mobility status   GP     Fredrich Birks 10/02/2011, 2:50 PM 10/02/2011 Fredrich Birks PTA [phone] pager [phone] office

## 2011-10-02 NOTE — Progress Notes (Signed)
eLink Physician-Brief Progress Note Patient Name: Malik Zuniga DOB: 18-Jun-1945 MRN: [id number]  Date of Service  10/02/2011   HPI/Events of Note   CT chest reviewed, neg PE, neg real concerning source fevers Fever improved Lactic acid less 2 resp status improved Sepsis like presentation, SIRS  eICU Interventions  UA Add zosyn to vanc Gas on CT likely post op related will notify NS Interrupt NIMV and observe      Nelda Bucks. 10/02/2011, 9:02 PM

## 2011-10-02 NOTE — Progress Notes (Addendum)
ANTIBIOTIC CONSULT NOTE - INITIAL  Pharmacy Consult for vancomycin Indication: rule out sepsis/wound infection  Allergies  Allergen Reactions  . Lisinopril Swelling    ANGIOEDEMA  . Lamictal (Lamotrigine) Other (See Comments)    Weakness/difficulty swallowing    Patient Measurements: Height: 5\' 10"  (177.8 cm) Weight: 170 lb (77.111 kg) IBW/kg (Calculated) : 73    Vital Signs: Temp: 101 F (38.3 C) (08/22 1800) Temp src: Axillary (08/22 1800) BP: 81/52 mmHg (08/22 1850) Pulse Rate: 130  (08/22 1900) Intake/Output from previous day: 08/21 0701 - 08/22 0700 In: 1183 [P.O.:680; I.V.:3; IV Piggyback:500] Out: 580 [Urine:350; Drains:230] Intake/Output from this shift:    Labs:  Basename 10/02/11 1830 10/01/11 1030 09/30/11 0630  WBC 3.6* 14.4* 8.2  HGB 8.8* 9.5* 9.9*  PLT 177 200 234  LABCREA -- -- --  CREATININE 0.53 0.71 0.60   Estimated Creatinine Clearance: 93.8 ml/min (by C-G formula based on Cr of 0.53). No results found for this basename: VANCOTROUGH:2,VANCOPEAK:2,VANCORANDOM:2,GENTTROUGH:2,GENTPEAK:2,GENTRANDOM:2,TOBRATROUGH:2,TOBRAPEAK:2,TOBRARND:2,AMIKACINPEAK:2,AMIKACINTROU:2,AMIKACIN:2, in the last 72 hours   Microbiology: Recent Results (from the past 720 hour(s))  SURGICAL PCR SCREEN     Status: Abnormal   Collection Time   09/23/11  9:15 AM      Component Value Range Status Comment   MRSA, PCR NEGATIVE  NEGATIVE Final    Staphylococcus aureus POSITIVE (*) NEGATIVE Final     Medical History: Past Medical History  Diagnosis Date  . Myocardial infarction   . Hyperlipidemia   . Depression   . Emphysema of lung   . Tremor   . Headache, chronic daily   . History of prostate cancer   . Cancer 2004    prostate  . On home O2   . Aspiration pneumonia   . Stroke     "mini strokes"; denies residual  . Emphysema   . Neuromuscular disorder 1998    right carpal tunnel release  . Hypertension     Malik Zuniga    Medications:  Prescriptions prior to  admission  Medication Sig Dispense Refill  . acetaminophen (TYLENOL) 325 MG tablet Take 650 mg by mouth every 6 (six) hours as needed. For pain      . albuterol (PROVENTIL HFA;VENTOLIN HFA) 108 (90 BASE) MCG/ACT inhaler Inhale 2 puffs into the lungs every 6 (six) hours as needed. For shortness of breath      . ARIPiprazole (ABILIFY) 5 MG tablet Take 5 mg by mouth daily.      . Ascorbic Acid (VITAMIN C) 500 MG tablet Take 500 mg by mouth daily.       Marland Kitchen aspirin 81 MG EC tablet Take 81 mg by mouth daily.       Marland Kitchen aspirin-acetaminophen-caffeine (EXCEDRIN MIGRAINE) 250-250-65 MG per tablet Take 2 tablets by mouth every 6 (six) hours as needed. For headaches      . budesonide-formoterol (SYMBICORT) 160-4.5 MCG/ACT inhaler Inhale 2 puffs into the lungs 2 (two) times daily.  1 Inhaler  3  . cholecalciferol (VITAMIN D) 1000 UNITS tablet Take 1,000 Units by mouth daily.      . diazepam (VALIUM) 5 MG tablet Take 2.5-5 mg by mouth 3 (three) times daily. For anxiety (1 in the am, 1/2 around noon, 1 at night)      . divalproex (DEPAKOTE) 500 MG 24 hr tablet Take 1,000 mg by mouth at bedtime.       . docusate sodium (COLACE) 100 MG capsule Take 200 mg by mouth daily.      . DULoxetine (CYMBALTA)  60 MG capsule Take 60 mg by mouth daily.      . fluticasone (FLONASE) 50 MCG/ACT nasal spray Place 2 sprays into the nose daily.  16 g  3  . HYDROcodone-acetaminophen (VICODIN) 5-500 MG per tablet Take 2 tablets by mouth every 6 (six) hours as needed. For pain      . metoprolol succinate (TOPROL-XL) 50 MG 24 hr tablet Take 50 mg by mouth daily. Take with or immediately following a meal.      . nitroGLYCERIN (NITROSTAT) 0.4 MG SL tablet Place 0.4 mg under the tongue every 5 (five) minutes as needed. For chest pain      . olmesartan-hydrochlorothiazide (BENICAR HCT) 20-12.5 MG per tablet Take 1 tablet by mouth daily.      . Omega-3 Fatty Acids (FISH OIL TRIPLE STRENGTH) 1400 MG CAPS Take 1 capsule by mouth daily.      Marland Kitchen  omeprazole (PRILOSEC) 40 MG capsule Take 40 mg by mouth 2 (two) times daily.       Marland Kitchen PARoxetine (PAXIL) 30 MG tablet Take 15 mg by mouth every morning.      Marland Kitchen Phenyleph-CPM-DM-APAP (ALKA-SELTZER PLUS COLD & COUGH) 06-11-08-325 MG CAPS Take 2 tablets by mouth daily.       . polyethylene glycol (MIRALAX / GLYCOLAX) packet Take 17 g by mouth daily.      . primidone (MYSOLINE) 250 MG tablet Take 250 mg by mouth 2 (two) times daily.      . simvastatin (ZOCOR) 40 MG tablet Take 40 mg by mouth every evening.      . tiotropium (SPIRIVA HANDIHALER) 18 MCG inhalation capsule Place 1 capsule (18 mcg total) into inhaler and inhale daily.  30 capsule  6  . Zinc 50 MG TABS Take 50 mg by mouth daily.        Assessment: Malik Zuniga is a 66 yo man s/p reexploration of  lumbar fusion with removal of hardware.  He now has fever of 101 and breathing difficulty. His WBC was up to 14.4 yesterday and now low at 3.6    Goal of Therapy:  Vancomycin trough level 15-20 mcg/ml  Plan:  1. Vancomycin 1000 mg IV load, then vancomycin 750 mg IV q8h 2. F/u renal function and culture data    Len Childs T 10/02/2011,8:01 PM  To add zosyn to vancomycin per CCM request Plan:  Zosyn 3.375 gm IV q8h, infuse each dose over 4 hours Herby Abraham, Pharm.D. [phone] 10/02/2011 9:06 PM

## 2011-10-02 NOTE — Significant Event (Signed)
Rapid Response Event Note  Overview: Time Called: 1735 Arrival Time: 1740 Event Type: Respiratory  Initial Focused Assessment: Called to patient's bedside because irregular respiratory pattern.  Pt using accessory muscles, but with minimal air movement in lung sounds. Patient pale and minimally responsive. HR 140s, ST  BP 121/65  RR 36  O2 sat 95 on O2  Interventions: Albuterol given Initiated Bipap 10/5  50%   PCXR done 2 new PIVs started, R & L FA Patient looked much better with BiPap Labs drawn and sent. Tx to 3109 via bed with Bipap and Zoll monitor. RN at bedside updated on patient status.  Event Summary: Name of Physician Notified: Dr Newell Coral at 1740  Name of Consulting Physician Notified: Dr Vassie Loll at 1800  Outcome: Transferred (Comment) [phone])  Event End Time: 1845  Marcellina Millin

## 2011-10-02 NOTE — Consult Note (Signed)
Name: Malik Zuniga MRN: [id number] DOB: Jun 07, 1945    LOS: 3 PCP- Beverely Low Neurosx : Pool  PCCM CONSULT NOTE  History of Present Illness: [name] with severe painful junctional kyphoscoliosis failing conservative management underwent Reexploration of lumbar fusion with removal of hardware from L2-S1. Open reduction of deformity & T7-S1 posterior lateral segmental arthrodesis on 8/19. Was doing well until 8/22 5pm when he developed fever, leucocytosis & abdominal breathing requiring rapid response call, placed on biPAP, PCCM consulted PMH - severe COPD on home O2 ([place]), quit 11/12, ambulates with cane, ? Psych history -onabilify & antidepressants    Lines / Drains: 8/19 wound drain >>  Cultures: Blood 8/22 >>  Antibiotics: 6/22 vanc >>  Tests / Events:   The patient is on bipap and unable to provide detailed history, which was obtained for available medical records.    Past Medical History  Diagnosis Date  . Myocardial infarction   . Hyperlipidemia   . Depression   . Emphysema of lung   . Tremor   . Headache, chronic daily   . History of prostate cancer   . Cancer 2004    prostate  . On home O2   . Aspiration pneumonia   . Stroke     "mini strokes"; denies residual  . Emphysema   . Neuromuscular disorder 1998    right carpal tunnel release  . Hypertension     [name]   Past Surgical History  Procedure Date  . Cholecystectomy 1996  . 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 Jordan Likes  . Vascular stent 2000    Dr. Alanda Amass  . Hand surgery 1989    crushed thumb, Dr. Merlyn Lot  . Ulnar nerve repair 1998    left arm, Dr. Merlyn Lot  . Gastrostomy w/ feeding tube 2008    Dr. Ewing Schlein  . 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 Prostatectomy 04/2001    removal of prostate cancer, Dr. Brunilda Payor  . Coronary angioplasty with stent placement 04/1998  . Carpal tunnel release 1998    right  . Back surgery 08/2004; 02/2005; 04/2006; 06/2007; 7/20101    all by Dr. Jordan Likes   Prior to  Admission medications   Medication Sig Start Date End Date Taking? Authorizing Provider  acetaminophen (TYLENOL) 325 MG tablet Take 650 mg by mouth every 6 (six) hours as needed. For pain   Yes Historical Provider, MD  albuterol (PROVENTIL HFA;VENTOLIN HFA) 108 (90 BASE) MCG/ACT inhaler Inhale 2 puffs into the lungs every 6 (six) hours as needed. For shortness of breath   Yes Historical Provider, MD  ARIPiprazole (ABILIFY) 5 MG tablet Take 5 mg by mouth daily.   Yes Historical Provider, MD  Ascorbic Acid (VITAMIN C) 500 MG tablet Take 500 mg by mouth daily.    Yes Historical Provider, MD  aspirin 81 MG EC tablet Take 81 mg by mouth daily.    Yes Historical Provider, MD  aspirin-acetaminophen-caffeine (EXCEDRIN MIGRAINE) [phone] MG per tablet Take 2 tablets by mouth every 6 (six) hours as needed. For headaches   Yes Historical Provider, MD  budesonide-formoterol (SYMBICORT) 160-4.5 MCG/ACT inhaler Inhale 2 puffs into the lungs 2 (two) times daily. 11/20/10  Yes Sheliah Hatch, MD  cholecalciferol (VITAMIN D) 1000 UNITS tablet Take 1,000 Units by mouth daily.   Yes Historical Provider, MD  diazepam (VALIUM) 5 MG tablet Take 2.5-5 mg by mouth 3 (three) times daily. For anxiety (1 in the am, 1/2 around noon, 1  at night)   Yes Historical Provider, MD  divalproex (DEPAKOTE) 500 MG 24 hr tablet Take 1,000 mg by mouth at bedtime.    Yes Historical Provider, MD  docusate sodium (COLACE) 100 MG capsule Take 200 mg by mouth daily.   Yes Historical Provider, MD  DULoxetine (CYMBALTA) 60 MG capsule Take 60 mg by mouth daily.   Yes Historical Provider, MD  fluticasone (FLONASE) 50 MCG/ACT nasal spray Place 2 sprays into the nose daily. 07/09/11  Yes Sheliah Hatch, MD  HYDROcodone-acetaminophen (VICODIN) 5-500 MG per tablet Take 2 tablets by mouth every 6 (six) hours as needed. For pain   Yes Historical Provider, MD  metoprolol succinate (TOPROL-XL) 50 MG 24 hr tablet Take 50 mg by mouth daily. Take with  or immediately following a meal.   Yes Historical Provider, MD  nitroGLYCERIN (NITROSTAT) 0.4 MG SL tablet Place 0.4 mg under the tongue every 5 (five) minutes as needed. For chest pain   Yes Historical Provider, MD  olmesartan-hydrochlorothiazide (BENICAR HCT) 20-12.5 MG per tablet Take 1 tablet by mouth daily.   Yes Historical Provider, MD  Omega-3 Fatty Acids (FISH OIL TRIPLE STRENGTH) 1400 MG CAPS Take 1 capsule by mouth daily.   Yes Historical Provider, MD  omeprazole (PRILOSEC) 40 MG capsule Take 40 mg by mouth 2 (two) times daily.    Yes Historical Provider, MD  PARoxetine (PAXIL) 30 MG tablet Take 15 mg by mouth every morning.   Yes Historical Provider, MD  Phenyleph-CPM-DM-APAP (ALKA-SELTZER PLUS COLD & COUGH) 06-11-08-325 MG CAPS Take 2 tablets by mouth daily.    Yes Historical Provider, MD  polyethylene glycol (MIRALAX / GLYCOLAX) packet Take 17 g by mouth daily.   Yes Historical Provider, MD  primidone (MYSOLINE) 250 MG tablet Take 250 mg by mouth 2 (two) times daily.   Yes Historical Provider, MD  simvastatin (ZOCOR) 40 MG tablet Take 40 mg by mouth every evening.   Yes Historical Provider, MD  tiotropium (SPIRIVA HANDIHALER) 18 MCG inhalation capsule Place 1 capsule (18 mcg total) into inhaler and inhale daily. 05/05/11  Yes Sheliah Hatch, MD  Zinc 50 MG TABS Take 50 mg by mouth daily.    Yes Historical Provider, MD  diazepam (VALIUM) 5 MG tablet Take 1 tablet (5 mg total) by mouth daily before breakfast. 10/02/11 10/12/11  Temple Pacini, MD  diazepam (VALIUM) 5 MG tablet Take 1-2 tablets (5-10 mg total) by mouth every 6 (six) hours as needed. 10/02/11 10/12/11  Temple Pacini, MD  oxyCODONE-acetaminophen (PERCOCET/ROXICET) 5-325 MG per tablet Take 1-2 tablets by mouth every 4 (four) hours as needed. 10/02/11 10/12/11  Temple Pacini, MD   Allergies Allergies  Allergen Reactions  . Lisinopril Swelling    ANGIOEDEMA  . Lamictal (Lamotrigine) Other (See Comments)    Weakness/difficulty  swallowing    Family History Family History  Problem Relation Age of Onset  . Heart disease Mother   . Cancer Father     brain cancer and prostate cancer     Social History  reports that he has been smoking Cigarettes.  He has a 25 pack-year smoking history. He has never used smokeless tobacco. He reports that he does not drink alcohol or use illicit drugs.  Review Of Systems Constitutional: negative for anorexia, fever  + Eyes: negative for irritation, redness and visual disturbance  Ears, nose, mouth, throat, and face: negative for earaches, epistaxis, nasal congestion and sore throat  Respiratory: negative for cough, dyspnea  +,  no sputum and wheezing  Cardiovascular: negative for chest pain, dyspnea, lower extremity edema, orthopnea, palpitations and syncope  Gastrointestinal: negative for abdominal pain, constipation, diarrhea, melena, nausea and vomiting  Genitourinary:negative for dysuria, frequency and hematuria  Hematologic/lymphatic: negative for bleeding, easy bruising and lymphadenopathy  Musculoskeletal:negative for arthralgias, muscle weakness and stiff joints  Neurological: negative for coordination problems, gait problems, headaches and weakness  Endocrine: negative for diabetic symptoms including polydipsia, polyuria and weight loss   Vital Signs: Temp:  [97.7 F (36.5 C)-101 F (38.3 C)] 101 F (38.3 C) (08/22 1800) Pulse Rate:  [94-141] 141  (08/22 1800) Resp:  [18-20] 20  (08/22 1429) BP: (93-151)/(58-77) 121/65 mmHg (08/22 1800) SpO2:  [86 %-100 %] 100 % (08/22 1800) FiO2 (%):  [50 %] 50 % (08/22 1800) I/O last 3 completed shifts: In: 1303 [P.O.:800; I.V.:3; IV Piggyback:500] Out: 730 [Urine:350; Drains:380]  Physical Examination: Gen. lethargic, well-nourished, in no distress, anxious affect ENT - no lesions, no post nasal drip Neck: No JVD, no thyromegaly, no carotid bruits Lungs: no use of accessory muscles, no dullness to percussion, clear  without rales or rhonchi  Cardiovascular: Rhythm regular, heart sounds  normal, no murmurs, no peripheral edema Abdomen: soft and non-tender, no hepatosplenomegaly, BS normal. Musculoskeletal: No deformities, no cyanosis or clubbing, hemovac with sanguinous drainage, no tenderness over wound Neuro:  alert, non focal Skin:  Warm, no lesions/ rash  Ventilator settings: Vent Mode:  [-]  FiO2 (%):  [50 %] 50 %  Labs and Imaging:  Reviewed.  Please refer to the Assessment and Plan section for relevant results.  CBC    Component Value Date/Time   WBC 14.4* 10/01/2011 1030   RBC 2.99* 10/01/2011 1030   HGB 9.5* 10/01/2011 1030   HCT 26.8* 10/01/2011 1030   PLT 200 10/01/2011 1030   MCV 89.6 10/01/2011 1030   MCH 31.8 10/01/2011 1030   MCHC 35.4 10/01/2011 1030   RDW 13.4 10/01/2011 1030   LYMPHSABS 2.7 05/22/2011 1421   MONOABS 0.5 05/22/2011 1421   EOSABS 0.1 05/22/2011 1421   BASOSABS 0.0 05/22/2011 1421    BMET    Component Value Date/Time   NA 128* 10/01/2011 1030   K 3.8 10/01/2011 1030   CL 94* 10/01/2011 1030   CO2 24 10/01/2011 1030   GLUCOSE 122* 10/01/2011 1030   GLUCOSE 88 12/10/2009   BUN 9 10/01/2011 1030   CREATININE 0.71 10/01/2011 1030   CALCIUM 8.9 10/01/2011 1030   GFRNONAA >90 10/01/2011 1030   GFRAA >90 10/01/2011 1030    pCXR - no infx or effusions Assessment and Plan  Unclear cause of deterioration - sinus tach with preserved BP, WOB acceptable on bipap, no bronchospasm  Favor sepsis vs PE on POD #3, less likely cardiac ischemia Await labs   Acute respiratory failure with hypoxia (10/02/2011)   Assessment: resp alkalosis with hypoxia on ABg   Plan: Obtain CT angio when able Duplex BLEs to r/o DVT Bipap for now EMpiric heparin if no other cause found on labs, chk d-dimer  Sepsis (10/02/2011)   Assessment: ?source - no obvious wound infection, but remains possible   Plan: vanc IV now chk procalcitonin, cx  COPD - on home O2   Kyphoscoliosis (09/29/2011)    Assessment: post op    Plan: per neurosx  GLOBAL - HOld all po meds for now incl antihypertensives & watch course over next few hrs  Best practices / Disposition: -->ICU status -->full code -->Heparin for DVT Px -->Protonix  for GI Px -->ventilator bundle -->NPO  -->family updated at bedside  The patient is critically ill with multiple organ systems failure and requires high complexity decision making for assessment and support, frequent evaluation and titration of therapies, application of advanced monitoring technologies and extensive interpretation of multiple databases. Critical Care Time devoted to patient care services described in this note is 60 minutes.  Cyril Mourning MD. Tonny Bollman. [facility] Pager [phone] If no response call [phone]

## 2011-10-02 NOTE — Progress Notes (Signed)
RT removed bipap per Dr. Chestine Spore request. RT placed patient on 4lpm [place]. Patient is doing well sats are 97%, HR 112, BP 92/53. Rt will continue to monitor.

## 2011-10-02 NOTE — Progress Notes (Signed)
No new problems. Pain well controlled. Still no radicular symptoms.  Afebrile. Blood pressure and pulse have normalized. Wound clean dry and intact. Motor and sensory function stable. Drain output lessening.  Tentatively plan for discharge tomorrow. Overall doing well.

## 2011-10-02 NOTE — Progress Notes (Signed)
Physical Therapy Treatment Patient Details Name: Malik Zuniga MRN: [id number] DOB: 12/19/1945 Today's Date: 10/02/2011 Time: 1010-1035 PT Time Calculation (min): 25 min  PT Assessment / Plan / Recommendation Comments on Treatment Session       Follow Up Recommendations       Barriers to Discharge        Equipment Recommendations       Recommendations for Other Services    Frequency     Plan      Precautions / Restrictions Precautions Precautions: Back Precaution Comments: Patient able to recall all precautions. Requiring cues throughout for no bending with upright posture Required Braces or Orthoses: Spinal Brace Spinal Brace: Applied in sitting position;Thoracolumbosacral orthotic   Pertinent Vitals/Pain     Mobility  Bed Mobility Left Sidelying to Sit: 4: Min assist;With rails Sitting - Scoot to Edge of Bed: 5: Supervision Details for Bed Mobility Assistance: Cues for log roll technique. Patient with heavy reliance on rails. Patient stated that MD was going to order hospital bed for home use Transfers Sit to Stand: 4: Min assist;With upper extremity assist;From bed Stand to Sit: 4: Min assist;With upper extremity assist;To chair/3-in-1 Details for Transfer Assistance: A to initiate stand and to ensure balance. Cues for safe hand placement and positioning prior to sitting Ambulation/Gait Ambulation/Gait Assistance: 4: Min guard Ambulation Distance (Feet): 225 Feet Assistive device: Rolling walker Ambulation/Gait Assistance Details: Patient requiring max cues throughout for upright posture Gait Pattern: Step-through pattern;Decreased stride length;Trunk flexed    Exercises     PT Diagnosis:    PT Problem List:   PT Treatment Interventions:     PT Goals    Visit Information  Last PT Received On: 10/02/11 Assistance Needed: +1    Subjective Data      Cognition  Overall Cognitive Status: Appears within functional limits for tasks  assessed/performed Arousal/Alertness: Awake/alert Orientation Level: Appears intact for tasks assessed Behavior During Session: [facility] for tasks performed    Balance     End of Session     GP     Fredrich Birks 10/02/2011, 2:46 PM 10/02/2011 Fredrich Birks PTA [phone] pager [phone] office

## 2011-10-03 DIAGNOSIS — R7881 Bacteremia: Secondary | ICD-10-CM

## 2011-10-03 DIAGNOSIS — A419 Sepsis, unspecified organism: Secondary | ICD-10-CM

## 2011-10-03 DIAGNOSIS — Z09 Encounter for follow-up examination after completed treatment for conditions other than malignant neoplasm: Secondary | ICD-10-CM

## 2011-10-03 DIAGNOSIS — J988 Other specified respiratory disorders: Secondary | ICD-10-CM

## 2011-10-03 LAB — CARDIAC PANEL(CRET KIN+CKTOT+MB+TROPI): Relative Index: 0.4 (ref 0.0–2.5)

## 2011-10-03 MED ORDER — IPRATROPIUM BROMIDE 0.02 % IN SOLN
0.5000 mg | Freq: Four times a day (QID) | RESPIRATORY_TRACT | Status: DC
  Administered 2011-10-03 – 2011-10-06 (×9): 0.5 mg via RESPIRATORY_TRACT
  Filled 2011-10-03 (×11): qty 2.5

## 2011-10-03 MED ORDER — ALBUTEROL SULFATE (5 MG/ML) 0.5% IN NEBU
2.5000 mg | INHALATION_SOLUTION | Freq: Four times a day (QID) | RESPIRATORY_TRACT | Status: DC
  Administered 2011-10-03 – 2011-10-06 (×9): 2.5 mg via RESPIRATORY_TRACT
  Filled 2011-10-03 (×10): qty 0.5

## 2011-10-03 MED ORDER — FENTANYL CITRATE 0.05 MG/ML IJ SOLN
50.0000 ug | INTRAMUSCULAR | Status: DC | PRN
  Administered 2011-10-03 – 2011-10-04 (×5): 50 ug via INTRAVENOUS
  Administered 2011-10-04 (×2): 100 ug via INTRAVENOUS
  Filled 2011-10-03 (×7): qty 2

## 2011-10-03 MED ORDER — SODIUM CHLORIDE 0.9 % IV BOLUS (SEPSIS)
1000.0000 mL | Freq: Once | INTRAVENOUS | Status: AC
  Administered 2011-10-03: 1000 mL via INTRAVENOUS

## 2011-10-03 NOTE — Progress Notes (Signed)
Occupational Therapy Note for 10/02/11 - note did not populate.   10/02/11 1700  OT Visit Information  Last OT Received On 10/02/11  Assistance Needed +1  OT Time Calculation  OT Start Time 1616  OT Stop Time 1647  OT Time Calculation (min) 31 min  Precautions  Precautions Back  Precaution Comments Patient able to recall all precautions. Requiring cues throughout for no bending with upright posture  Required Braces or Orthoses Spinal Brace  Spinal Brace Applied in sitting position;TLSO  ADL  ADL Comments Pt. moved to EOB sitting with min A.  Pt. with complaint not feeling well, but unable to clarify with pt.  Dyspnea 3/4.  Attempted to assist pt. with donning brace, then pt. requesting to lie down due to not feeling well.    Cognition  Overall Cognitive Status Appears within functional limits for tasks assessed/performed  Arousal/Alertness Awake/alert  Orientation Level Appears intact for tasks assessed  Behavior During Session [facility] for tasks performed  Bed Mobility  Bed Mobility Rolling Left;Left Sidelying to Sit;Sitting - Scoot to Edge of Bed;Sit to Sidelying Left  Rolling Left 4: Min guard;With rail  Left Sidelying to Sit 4: Min assist;With rails  Sitting - Scoot to Edge of Bed 4: Min assist;With rail  Sit to Sidelying Left 3: Mod assist;With rail  Scooting to [facility] 2: Max assist  Details for Bed Mobility Assistance Min verbal cues for back precautions.  Needs assistance with sidelying to sit  Balance  Balance Assessed Yes  Static Sitting Balance  Static Sitting - Level of Assistance 4: Min assist (Pt. falling to Lt. and posteriorly)  OT - End of Session  Equipment Utilized During Treatment Back brace  Activity Tolerance Other (comment);Patient limited by fatigue (and not feeling well)  Patient left in bed;with call bell/phone within reach  Nurse Communication Other (comment) (Vitals)  OT Assessment/Plan  Comments on Treatment Session Pt. with very limited participation this  pm.  Pt. with complaint not feeling well.  He sat EOB ~2 mins, then requested to lie down due to feeling badly.  Dyspnea 3/4 while EOB.  Once supine, vitals taken.  BP 151/77; O2 88% on RA; HR 125.   RN notified.  2L O2 applied via [place]. Pt. was provided with and instructed in use of incentive spirometer.  Pt. with difficulty using it due to bil. UE tremors.    OT Plan Discharge plan remains appropriate  OT Frequency Min 2X/week  Follow Up Recommendations Supervision/Assistance - 24 hour;No OT follow up  Equipment Recommended None recommended by OT  ADL Goals  ADL Goal: Grooming - Progress Not progressing  ADL Goal: Upper Body Bathing - Progress Not progressing  ADL Goal: Lower Body Bathing - Progress Not progressing  ADL Goal: Lower Body Dressing - Progress Not progressing  ADL Goal: Toileting - Clothing Manipulation - Progress Not progressing  ADL Goal: Tub/Shower Transfer - Progress Not progressing     [employer], OTR/L [phone]

## 2011-10-03 NOTE — Progress Notes (Addendum)
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 Open reduction of deformity & T7-S1 posterior lateral segmental arthrodesis on 8/19.  Pt seen by [facility] 8/22 for SIRS, resp distress.     Lines / Drains: 8/19 wound drain >>  Cultures: Blood 8/22 >>  Antibiotics: 8/22 vanc  (sepsis)>> 8/22 zosyn (sepsis, ?source)>>  Tests / Events: CTA chest >>neg for PE   Vital Signs: Temp:  [97.7 F (36.5 C)-101 F (38.3 C)] 98.9 F (37.2 C) (08/23 0737) Pulse Rate:  [96-141] 104  (08/23 0700) Resp:  [17-32] 25  (08/23 0700) BP: (81-151)/(47-77) 98/49 mmHg (08/23 0700) SpO2:  [88 %-100 %] 98 % (08/23 0737) FiO2 (%):  [50 %] 50 % (08/22 1800) I/O last 3 completed shifts: In: 3645.5 [P.O.:200; I.V.:533; IV Piggyback:2912.5] Out: 2140 [Urine:1970; Drains:170]  Physical Examination: Gen. More alert. ENT - no lesions, no post nasal drip Neck: No JVD, no thyromegaly, no carotid bruits Lungs clearer  Cardiovascular: Rhythm regular, heart sounds  normal, no murmurs, no peripheral edema Abdomen: soft and non-tender, no hepatosplenomegaly, BS normal. Musculoskeletal: No deformities, no cyanosis or clubbing, hemovac with sanguinous drainage, no tenderness over wound Neuro:  alert, non focal Skin:  Warm, no lesions/ rash  On nasal cannulae   Labs and Imaging:  Reviewed.  Please refer to the Assessment and Plan section for relevant results.  CBC    Component Value Date/Time   WBC 3.6* 10/02/2011 1830   RBC 2.79* 10/02/2011 1830   HGB 8.8* 10/02/2011 1830   HCT 24.6* 10/02/2011 1830   PLT 177 10/02/2011 1830   MCV 88.2 10/02/2011 1830   MCH 31.5 10/02/2011 1830   MCHC 35.8 10/02/2011 1830   RDW 13.2 10/02/2011 1830   LYMPHSABS 2.7 05/22/2011 1421   MONOABS 0.5 05/22/2011 1421   EOSABS 0.1 05/22/2011 1421   BASOSABS 0.0 05/22/2011 1421    BMET    Component Value Date/Time   NA 130* 10/02/2011 1830   K 4.2 10/02/2011 1830   CL 93* 10/02/2011 1830   CO2 26 10/02/2011 1830   GLUCOSE 111* 10/02/2011 1830   GLUCOSE 88 12/10/2009   BUN 8 10/02/2011 1830   CREATININE 0.53 10/02/2011 1830   CALCIUM 9.0 10/02/2011 1830   GFRNONAA >90 10/02/2011 1830   GFRAA >90 10/02/2011 1830    pCXR - atx at bases,  infilt RUL, CTA chest neg for PE  Assessment and Plan    Acute respiratory failure with hypoxia (10/02/2011)   Assessment: suspect d/t atelectasis and bronchitis, doubt other systemic infection.  No PE Plan Check venous doppler Titrate oxygen BD therapy    Sepsis (10/02/2011)   Assessment: ?source - no obvious wound infection, but remains possible   Plan: vanc IV now plus zosyn.    COPD - on home O2  Cont oxygen and BDs  Kyphoscoliosis (09/29/2011)   Assessment: post op    Plan: per neurosx  OK to resume all PO meds.  Best practices / Disposition: -->ICU status -->full code -->Heparin for DVT Px -->Protonix for GI Px -->PO -->family updated at bedside  The patient is critically ill with multiple organ systems failure and requires high complexity decision making for assessment and support, frequent evaluation and titration of therapies, application of advanced monitoring technologies and extensive interpretation  of multiple databases. Critical Care Time devoted to patient care services described in this note is 35  minutes.  Shan Levans Beeper  [phone]  Cell  [phone]  If no response or cell goes to voicemail, call beeper [phone]

## 2011-10-03 NOTE — Progress Notes (Signed)
*  Preliminary Results* Bilateral lower extremity venous duplex completed. Bilateral lower extremities are negative for deep vein thrombosis. Preliminary results discussed with RN Greig Castilla.  10/03/2011 2:42 PM Gertie Fey, RDMS, RDCS

## 2011-10-03 NOTE — Care Management Note (Signed)
    Page 1 of 2   10/07/2011     9:21:28 AM   CARE MANAGEMENT NOTE 10/07/2011  Patient:  Malik Zuniga, Malik Zuniga   Account Number:  [id number]  Date Initiated:  10/03/2011  Documentation initiated by:  [facility]  Subjective/Objective Assessment:   Admitted post op open reduction T7-S1 posterior lateral segmental arthrodesis.     Action/Plan:   PT/OT evals- recommending HHPTand HHOT   Anticipated DC Date:  10/06/2011   Anticipated DC Plan:  HOME W HOME HEALTH SERVICES      DC Planning Services  CM consult      Choice offered to / List presented to:  C-3 Spouse   DME arranged  HOSPITAL BED      DME agency  [facility]     HH arranged  HH-2 PT  HH-3 OT      [facility] agency  [facility]   Status of service:  Completed, signed off Medicare Important Message given?   (If response is "NO", the following Medicare IM given date fields will be blank) Date Medicare IM given:   Date Additional Medicare IM given:    Discharge Disposition:  HOME W HOME HEALTH SERVICES  Per UR Regulation:  Reviewed for med. necessity/level of care/duration of stay  If discussed at Long Length of Stay Meetings, dates discussed:    Comments:  10/07/11 Contacted [facility] for hospital bed on 10/06/11. Notified Debbie at [place] of patient discharge on 10/06/11. Jacquelynn Cree RN, BSN, CCM  10/03/11 Patient was transferred to ICU on 10/02/11 due to respiratory distress. HHPT/OT have been ordered , spoke with patient, he requested that I speak with his wife regarding HHC. Spoke with Ms Yordy about [facility], she chose [facility] for HHPT/OT. They have have worked with Genevieve Norlander in the past. Patient currently has home oxygen with [facility]. They are agreeable to a hospital bed and would like to use [facility] for the bed. Contacted Debbie at [facility] and requested HHPT/OT. Will need to contact [facility] once d/c date determined. CM will continue to follow for d/c needs. Jacquelynn Cree  RN, BSN, CM

## 2011-10-03 NOTE — Progress Notes (Signed)
Occupational Therapy Treatment Patient Details Name: Malik Zuniga MRN: [id number] DOB: 1945-09-22 Today's Date: 10/03/2011 Time: 1110-1141 OT Time Calculation (min): 31 min  OT Assessment / Plan / Recommendation Comments on Treatment Session Pt progressing with bed mobility and brace education. However pt with decreased activity tolerance.    Follow Up Recommendations  Home health OT    Barriers to Discharge       Equipment Recommendations  None recommended by PT;None recommended by OT    Recommendations for Other Services    Frequency Min 2X/week   Plan Discharge plan remains appropriate    Precautions / Restrictions Precautions Precautions: Back Precaution Comments: Patient able to recall all precautions. Requiring cues throughout for no bending with upright posture Required Braces or Orthoses: Spinal Brace Spinal Brace: Applied in sitting position;Thoracolumbosacral orthotic   Pertinent Vitals/Pain BP 124/65 96% on 4 L with ambulation    ADL  Grooming: Performed;Wash/dry face;Moderate assistance (cool face cloth to face) Where Assessed - Grooming: Supported sitting Upper Body Dressing: Performed;+2 Total assistance Upper Body Dressing: Patient Percentage: 30% Where Assessed - Upper Body Dressing: Supported sitting (don TLSO) Equipment Used: Rolling walker;Back brace;Gait belt Transfers/Ambulation Related to ADLs: see PT note> Pt walks with kyphotic posture and pushing RW too far ahead ADL Comments: Pt progressing well with bed mobility and with perfect form. pt (A) with don TLSO brace and pt /wife both verbalized understanding of brace positioning. Pt fatigued quickly and with rattle sound to breathing    OT Diagnosis:    OT Problem List:   OT Treatment Interventions:     OT Goals Acute Rehab OT Goals OT Goal Formulation: With patient Time For Goal Achievement: 10/08/11 Potential to Achieve Goals: Good ADL Goals Pt Will Perform Grooming: with  supervision;Standing at sink ADL Goal: Grooming - Progress: Progressing toward goals Pt Will Perform Upper Body Bathing: with set-up;Sitting, chair Pt Will Perform Lower Body Bathing: with supervision;Sit to stand from chair;Sit to stand from bed Pt Will Perform Lower Body Dressing: with supervision;Sit to stand from chair;Sit to stand from bed;with adaptive equipment Pt Will Perform Toileting - Clothing Manipulation: with supervision;Standing ADL Goal: Toileting - Clothing Manipulation - Progress: Progressing toward goals Pt Will Perform Tub/Shower Transfer: with supervision;Transfer tub bench;Ambulation  Visit Information  Last OT Received On: 10/03/11 Assistance Needed: +1 PT/OT Co-Evaluation/Treatment: Yes    Subjective Data      Prior Functioning       Cognition  Overall Cognitive Status: Appears within functional limits for tasks assessed/performed Arousal/Alertness: Awake/alert Orientation Level: Appears intact for tasks assessed Behavior During Session: [facility] for tasks performed    Mobility Bed Mobility Bed Mobility: Rolling Left;Left Sidelying to Sit;Sit to Sidelying Right Rolling Left: 4: Min assist;With rail Left Sidelying to Sit: 4: Min assist;With rails Sitting - Scoot to [place] of Bed: 4: Min assist Sit to Sidelying Right: 3: Mod assist;With rail Details for Bed Mobility Assistance: vc's for technique, safety issues; truncal assist Transfers Sit to Stand: 4: Min assist;With upper extremity assist;From bed Stand to Sit: 4: Min assist;With upper extremity assist;To chair/3-in-1 Details for Transfer Assistance: vc's for safe technique and hand placement; min stability assist/truncal support   Exercises    Balance Balance Balance Assessed: Yes Static Sitting Balance Static Sitting - Level of Assistance: 5: Stand by assistance  End of Session OT - End of Session Equipment Utilized During Treatment: Back brace Activity Tolerance: Patient limited by fatigue Patient  left: in bed;with call bell/phone within reach;with family/visitor present (unable  to tolerate chair after 5 minutes) Nurse Communication: Mobility status  GO     Lucile Shutters 10/03/2011, 2:42 PM Pager: [phone]

## 2011-10-03 NOTE — Progress Notes (Signed)
Patient ID: Malik Zuniga, male   DOB: 1946-01-19, 66 y.o.   MRN: [id number] Also addendum to the noted earlier, it looks like Malik Zuniga white count dropped from 14 4 to 3.9 we'll discuss his critical-care seems like an abrupt fall for 24 hours.

## 2011-10-03 NOTE — Progress Notes (Signed)
Physical Therapy Treatment Patient Details Name: Malik Zuniga MRN: [id number] DOB: August 13, 1945 Today's Date: 10/03/2011 Time: 0865-7846 PT Time Calculation (min): 33 min  PT Assessment / Plan / Recommendation Comments on Treatment Session  Pt doing generally well, but has had a minor set back since yesterday with respiratory issues.  Expect him to continue his steady improvement    Follow Up Recommendations  Home health PT    Barriers to Discharge        Equipment Recommendations  None recommended by PT    Recommendations for Other Services    Frequency Min 5X/week   Plan Discharge plan remains appropriate;Frequency remains appropriate    Precautions / Restrictions Precautions Precautions: Back Precaution Comments: Patient able to recall all precautions. Requiring cues throughout for no bending with upright posture Required Braces or Orthoses: Spinal Brace Spinal Brace: Applied in sitting position;Thoracolumbosacral orthotic   Pertinent Vitals/Pain     Mobility  Bed Mobility Bed Mobility: Rolling Left;Left Sidelying to Sit;Sit to Sidelying Right Rolling Left: 4: Min assist;With rail Left Sidelying to Sit: 4: Min assist;With rails Sitting - Scoot to [place] of Bed: 4: Min assist Sit to Sidelying Right: 3: Mod assist;With rail Details for Bed Mobility Assistance: vc's for technique, safety issues; truncal assist Transfers Transfers: Sit to Stand;Stand to Sit Sit to Stand: 4: Min assist;With upper extremity assist;From bed Stand to Sit: 4: Min assist;With upper extremity assist;To chair/3-in-1 Details for Transfer Assistance: vc's for safe technique and hand placement; min stability assist/truncal support Ambulation/Gait Ambulation/Gait Assistance: 4: Min assist Ambulation Distance (Feet): 100 Feet Assistive device: Rolling walker Ambulation/Gait Assistance Details: vc's for postural checks consistently; help maneuvering RW Gait Pattern: Step-through pattern;Decreased step  length - right;Decreased step length - left;Decreased stride length;Trunk flexed;Narrow base of support Stairs: No    Exercises     PT Diagnosis:    PT Problem List:   PT Treatment Interventions:     PT Goals Acute Rehab PT Goals Potential to Achieve Goals: Good PT Goal: Rolling Supine to Right Side - Progress: Progressing toward goal PT Goal: Supine/Side to Sit - Progress: Progressing toward goal PT Goal: Sit to Supine/Side - Progress: Progressing toward goal PT Goal: Sit to Stand - Progress: Progressing toward goal PT Goal: Stand to Sit - Progress: Progressing toward goal PT Goal: Ambulate - Progress: Progressing toward goal  Visit Information  Last PT Received On: 10/03/11 Assistance Needed: +1    Subjective Data  Subjective: I don't feel well and I just can't stay up   Cognition  Overall Cognitive Status: Appears within functional limits for tasks assessed/performed Arousal/Alertness: Awake/alert Orientation Level: Appears intact for tasks assessed Behavior During Session: [facility] for tasks performed    Balance  Balance Balance Assessed: Yes Static Sitting Balance Static Sitting - Level of Assistance: 5: Stand by assistance  End of Session PT - End of Session Equipment Utilized During Treatment: Gait belt;Back brace Activity Tolerance: Patient tolerated treatment well;Other (comment) (felt nauseous after treatment) Patient left: in bed;with call bell/phone within reach;with family/visitor present Nurse Communication: Mobility status   GP     Malik Zuniga, Malik Zuniga 10/03/2011, 1:18 PM  10/03/2011  Malik Zuniga, PT [phone] (pager)

## 2011-10-03 NOTE — Progress Notes (Signed)
Subjective: Patient reports Is feeling much better this morning his breathing is much less labored he is not having any significant pain in his back or his legs  Objective: Vital signs in last 24 hours: Temp:  [97.7 F (36.5 C)-101 F (38.3 C)] 99.8 F (37.7 C) (08/23 0500) Pulse Rate:  [96-141] 104  (08/23 0700) Resp:  [17-32] 25  (08/23 0700) BP: (81-151)/(47-77) 98/49 mmHg (08/23 0700) SpO2:  [88 %-100 %] 100 % (08/23 0700) FiO2 (%):  [50 %] 50 % (08/22 1800)  Intake/Output from previous day: 08/22 0701 - 08/23 0700 In: 3442.5 [I.V.:530; IV Piggyback:2912.5] Out: 1710 [Urine:1620; Drains:90] Intake/Output this shift:    He is awake alert she's oriented her strength is 5 out of 5 in his iliopsoas, quads, hip she's, gastrocs, anterior tibialis, and EHL.  Lab Results:  Basename 10/02/11 1830 10/01/11 1030  WBC 3.6* 14.4*  HGB 8.8* 9.5*  HCT 24.6* 26.8*  PLT 177 200   BMET  Basename 10/02/11 1830 10/01/11 1030  NA 130* 128*  K 4.2 3.8  CL 93* 94*  CO2 26 24  GLUCOSE 111* 122*  BUN 8 9  CREATININE 0.53 0.71  CALCIUM 9.0 8.9    Studies/Results: Ct Angio Chest Pe W/cm &/or Wo Cm  10/02/2011  *RADIOLOGY REPORT*  Clinical Data: Unexplained tachycardia  CT ANGIOGRAPHY CHEST  Technique:  Multidetector CT imaging of the chest using the standard protocol during bolus administration of intravenous contrast. Multiplanar reconstructed images including MIPs were obtained and reviewed to evaluate the vascular anatomy.  Contrast: OMNIPAQUE IOHEXOL 350 MG/ML SOLN  Comparison: 11/26/2010  Findings: There are no filling defects in the pulmonary arterial tree to suggest acute pulmonary thromboembolism.  Negative abnormal mediastinal adenopathy.  Negative pericardial effusion.  Coronary artery calcifications are noted.  Dependent atelectasis at both lung bases right greater than left. Nonspecific patchy densities are seen in the anterior right upper lobe on image 72 and in the medial  right middle lobe on image 75.  Thoracolumbar fusion is in place with hardware beginning at T8 and extending beyond the lower limit of the study.  Gas is present in the musculature and soft tissues compatible with recent postoperative change.  Surgical drain is in place.  The right pedicle screw and T12 breaches the medial wall of the right T12 pedicle and may be intradural.  IMPRESSION: No evidence of acute pulmonary thromboembolism.  Bibasilar atelectasis.  Thoracolumbar fusion has been performed.  Right T12 pedicle screw breaches the medial wall of the right pedicle as described.  See above comments.   Original Report Authenticated By: Donavan Burnet, M.D.    Dg Chest Port 1 View  10/02/2011  *RADIOLOGY REPORT*  Clinical Data: Severe dyspnea.  PORTABLE CHEST - 1 VIEW  Comparison: Previous examinations.  Findings: Normal sized heart.  Minimal linear density at the left lung base.  Otherwise, clear lungs.  Thoracolumbar pedicle screws and rods.  Mild scoliosis. Tubing overlying the left chest.  IMPRESSION: Minimal linear atelectasis or scarring at the left lung base.   Original Report Authenticated By: Darrol Angel, M.D.     Assessment/Plan: Aggressive mobilization today I observed in the ICU for another 24-48 hours to confirm stabilization of his lungs CT scan ruled out PE there was a be right T12 pedicle screw does appear to course along the medial border of the pedicle I do not think that is intradural it probably will be needed need to be repositioned however currently the patient is  asymptomatic at this point like to get his lungs a chance to recover from the event of yesterday discussed this with Dr. Theresa Duty potentially patient had to return to the OR in the next few days for repositioning of the T12 pedicle screw.  LOS: 4 days     Kano Heckmann P 10/03/2011, 7:28 AM

## 2011-10-04 ENCOUNTER — Inpatient Hospital Stay (HOSPITAL_COMMUNITY): Payer: Medicare Other

## 2011-10-04 DIAGNOSIS — R7881 Bacteremia: Secondary | ICD-10-CM | POA: Diagnosis not present

## 2011-10-04 DIAGNOSIS — B965 Pseudomonas (aeruginosa) (mallei) (pseudomallei) as the cause of diseases classified elsewhere: Secondary | ICD-10-CM

## 2011-10-04 HISTORY — DX: Bacteremia: R78.81

## 2011-10-04 HISTORY — DX: Pseudomonas (aeruginosa) (mallei) (pseudomallei) as the cause of diseases classified elsewhere: B96.5

## 2011-10-04 LAB — GLUCOSE, CAPILLARY

## 2011-10-04 LAB — BASIC METABOLIC PANEL
CO2: 26 mEq/L (ref 19–32)
Chloride: 98 mEq/L (ref 96–112)
Creatinine, Ser: 0.59 mg/dL (ref 0.50–1.35)
GFR calc Af Amer: >90 mL/min (ref >90)
Potassium: 3.3 mEq/L — ABNORMAL LOW (ref 3.5–5.1)
Sodium: 134 mEq/L — ABNORMAL LOW (ref 135–145)

## 2011-10-04 LAB — CBC
HCT: 21.8 % — ABNORMAL LOW (ref 39.0–52.0)
Hemoglobin: 7.8 g/dL — ABNORMAL LOW (ref 13.0–17.0)
RBC: 2.46 MIL/uL — ABNORMAL LOW (ref 4.22–5.81)
RDW: 13.5 % (ref 11.5–15.5)
WBC: 6.2 10*3/uL (ref 4.0–10.5)

## 2011-10-04 MED ORDER — ZOLPIDEM TARTRATE 10 MG PO TABS: 10.0000 mg | ORAL_TABLET | Freq: Every evening | ORAL | Status: DC | PRN

## 2011-10-04 MED ORDER — POTASSIUM CHLORIDE CRYS ER 20 MEQ PO TBCR
40.0000 meq | EXTENDED_RELEASE_TABLET | Freq: Once | ORAL | Status: AC
  Administered 2011-10-04: 40 meq via ORAL
  Filled 2011-10-04: qty 2

## 2011-10-04 MED ORDER — ZOLPIDEM TARTRATE 5 MG PO TABS
5.0000 mg | ORAL_TABLET | Freq: Every evening | ORAL | Status: DC | PRN
  Administered 2011-10-04 – 2011-10-05 (×2): 5 mg via ORAL
  Filled 2011-10-04 (×2): qty 1

## 2011-10-04 NOTE — Progress Notes (Signed)
Name: Malik Zuniga MRN: [id number] DOB: Oct 15, 1945    LOS: 5 PCP- Beverely Low Neurosx : Pool  PCCM  NOTE  History of Present Illness: [name] with severe painful junctional kyphoscoliosis failing conservative management underwent Reexploration of lumbar fusion with removal of hardware from L2-S1. Open reduction of deformity & T7-S1 posterior lateral segmental arthrodesis on 8/19.  Pt seen by [facility] 8/22 for SIRS, resp distress.     Lines / Drains: 8/19 wound drain >>  Cultures: Blood 8/22 >>gram neg rods 2/2>>>  Antibiotics: 8/22 vanc  (sepsis)>>8/24 8/22 zosyn (sepsis, ?source gram neg rods in blood)>>  Tests / Events: CTA chest >>neg for PE   Vital Signs: Temp:  [97.7 F (36.5 C)-98.8 F (37.1 C)] 98 F (36.7 C) (08/24 0400) Pulse Rate:  [76-119] 96  (08/24 0700) Resp:  [14-26] 16  (08/24 0700) BP: (93-121)/(45-72) 113/60 mmHg (08/24 0700) SpO2:  [94 %-100 %] 97 % (08/24 0726) Weight:  [82.2 kg (181 lb 3.5 oz)] 82.2 kg (181 lb 3.5 oz) (08/24 0400) I/O last 3 completed shifts: In: 4393.8 [P.O.:800; I.V.:1118.8; IV Piggyback:2475] Out: 3325 [Urine:3165; Drains:160]  Physical Examination: Gen. More alert. ENT - no lesions, no post nasal drip Neck: No JVD, no thyromegaly, no carotid bruits Lungs clearer  Cardiovascular: Rhythm regular, heart sounds  normal, no murmurs, no peripheral edema Abdomen: soft and non-tender, no hepatosplenomegaly, BS normal. Musculoskeletal: No deformities, no cyanosis or clubbing, hemovac with sanguinous drainage, no tenderness over wound Neuro:  alert, non focal Skin:  Warm, no lesions/ rash  On nasal cannulae   Labs and Imaging:  Reviewed.  Please refer to the Assessment and Plan section for relevant results.  CBC    Component Value Date/Time   WBC 6.2 10/04/2011 0420   RBC 2.46* 10/04/2011 0420   HGB 7.8* 10/04/2011 0420   HCT 21.8* 10/04/2011 0420   PLT 190 10/04/2011 0420   MCV 88.6 10/04/2011 0420   MCH 31.7 10/04/2011 0420   MCHC  35.8 10/04/2011 0420   RDW 13.5 10/04/2011 0420   LYMPHSABS 2.7 05/22/2011 1421   MONOABS 0.5 05/22/2011 1421   EOSABS 0.1 05/22/2011 1421   BASOSABS 0.0 05/22/2011 1421    BMET    Component Value Date/Time   NA 134* 10/04/2011 0420   K 3.3* 10/04/2011 0420   CL 98 10/04/2011 0420   CO2 26 10/04/2011 0420   GLUCOSE 103* 10/04/2011 0420   GLUCOSE 88 12/10/2009   BUN 5* 10/04/2011 0420   CREATININE 0.59 10/04/2011 0420   CALCIUM 8.3* 10/04/2011 0420   GFRNONAA >90 10/04/2011 0420   GFRAA >90 10/04/2011 0420    pCXR - atx at bases improved ,  infilt RUL, CTA chest neg for PE  Assessment and Plan    Acute respiratory failure with hypoxia (10/02/2011)   Assessment: suspect d/t atelectasis and bronchitis, with bacteremia d/t gram neg rods ?source No PE Plan Titrate oxygen BD therapy ID consultation Ok to tfr to 4N  Sepsis (10/02/2011)   Assessment: gram negative rod bacteremia  ?source - no obvious wound infection, but remains possible   Plan:narrow to zosyn Obtain ID consult    COPD - on home O2  Cont oxygen and BDs  Kyphoscoliosis (09/29/2011)   Assessment: post op    Plan: per neurosx  OK to resume all PO meds.  Best practices / Disposition: -->ICU status>>tr to 4N 8/24 -->full code -->Heparin for DVT Px -->Protonix for GI Px -->PO      Shan Levans Beeper  [phone]  Cell  [phone]  If no response or cell goes to voicemail, call beeper [phone]

## 2011-10-04 NOTE — Progress Notes (Signed)
Subjective: Patient reports wants to go home  Objective: Vital signs in last 24 hours: Temp:  [97.7 F (36.5 C)-98.8 F (37.1 C)] 97.7 F (36.5 C) (08/24 0930) Pulse Rate:  [76-119] 117  (08/24 0900) Resp:  [14-26] 19  (08/24 0900) BP: (93-129)/(45-72) 119/61 mmHg (08/24 0900) SpO2:  [94 %-100 %] 99 % (08/24 0900) Weight:  [82.2 kg (181 lb 3.5 oz)] 82.2 kg (181 lb 3.5 oz) (08/24 0400)  Intake/Output from previous day: 08/23 0701 - 08/24 0700 In: 1971.3 [P.O.:800; I.V.:608.8; IV Piggyback:562.5] Out: 1615 [Urine:1545; Drains:70] Intake/Output this shift: Total I/O In: 40 [I.V.:40] Out: 200 [Urine:200]  alert and oriented x 4 Moving lower extremities well Wound is clean and dry and without signs of infection Drain removed today Transfer to floor today  Lab Results:  Basename 10/04/11 0420 10/02/11 1830  WBC 6.2 3.6*  HGB 7.8* 8.8*  HCT 21.8* 24.6*  PLT 190 177   BMET  Basename 10/04/11 0420 10/02/11 1830  NA 134* 130*  K 3.3* 4.2  CL 98 93*  CO2 26 26  GLUCOSE 103* 111*  BUN 5* 8  CREATININE 0.59 0.53  CALCIUM 8.3* 9.0    Studies/Results: Ct Angio Chest Pe W/cm &/or Wo Cm  10/02/2011  *RADIOLOGY REPORT*  Clinical Data: Unexplained tachycardia  CT ANGIOGRAPHY CHEST  Technique:  Multidetector CT imaging of the chest using the standard protocol during bolus administration of intravenous contrast. Multiplanar reconstructed images including MIPs were obtained and reviewed to evaluate the vascular anatomy.  Contrast: OMNIPAQUE IOHEXOL 350 MG/ML SOLN  Comparison: 11/26/2010  Findings: There are no filling defects in the pulmonary arterial tree to suggest acute pulmonary thromboembolism.  Negative abnormal mediastinal adenopathy.  Negative pericardial effusion.  Coronary artery calcifications are noted.  Dependent atelectasis at both lung bases right greater than left. Nonspecific patchy densities are seen in the anterior right upper lobe on image 72 and in the medial  right middle lobe on image 75.  Thoracolumbar fusion is in place with hardware beginning at T8 and extending beyond the lower limit of the study.  Gas is present in the musculature and soft tissues compatible with recent postoperative change.  Surgical drain is in place.  The right pedicle screw and T12 breaches the medial wall of the right T12 pedicle and may be intradural.  IMPRESSION: No evidence of acute pulmonary thromboembolism.  Bibasilar atelectasis.  Thoracolumbar fusion has been performed.  Right T12 pedicle screw breaches the medial wall of the right pedicle as described.  See above comments.   Original Report Authenticated By: Donavan Burnet, M.D.    Dg Chest Port 1 View  10/04/2011  *RADIOLOGY REPORT*  Clinical Data: Atelectasis and postop fever.  PORTABLE CHEST - 1 VIEW  Comparison: 10/02/2011  Findings: Single view chest demonstrates pedicle screw and rod fixation of thoracolumbar spine. Increased densities at the left lung base may represent atelectasis.  Otherwise, the lungs are clear.  Heart and mediastinum are stable.  Again noted is a spinal drain.  Trachea is midline.  IMPRESSION:  Slightly increased left basilar densities are suggestive for atelectasis.   Original Report Authenticated By: Richarda Overlie, M.D.    Dg Chest Port 1 View  10/02/2011  *RADIOLOGY REPORT*  Clinical Data: Severe dyspnea.  PORTABLE CHEST - 1 VIEW  Comparison: Previous examinations.  Findings: Normal sized heart.  Minimal linear density at the left lung base.  Otherwise, clear lungs.  Thoracolumbar pedicle screws and rods.  Mild scoliosis. Tubing overlying the  left chest.  IMPRESSION: Minimal linear atelectasis or scarring at the left lung base.   Original Report Authenticated By: Darrol Angel, M.D.     Assessment/Plan: Continue abx Obviously much better overall  LOS: 5 days     Darchelle Nunes L 10/04/2011, 9:39 AM

## 2011-10-04 NOTE — Progress Notes (Signed)
Physical Therapy Treatment Patient Details Name: Malik Zuniga MRN: [id number] DOB: January 15, 1946 Today's Date: 10/04/2011 Time: 7829-5621 PT Time Calculation (min): 23 min  PT Assessment / Plan / Recommendation Comments on Treatment Session  Pt ambulating well, although limited with mobility secondary to increased HR. Multiple rest breaks throughout ambulation to help decrease HR, although pt still tachy. RN present and aware, pt is asymptomatic.    Follow Up Recommendations  Home health PT    Barriers to Discharge        Equipment Recommendations  None recommended by PT;None recommended by OT    Recommendations for Other Services    Frequency Min 5X/week   Plan Discharge plan remains appropriate;Frequency remains appropriate    Precautions / Restrictions Precautions Precautions: Back Precaution Comments: patient able to verbalize 2/3 back precautions. Reducatd Required Braces or Orthoses: Spinal Brace Spinal Brace: Applied in sitting position;Thoracolumbosacral orthotic Restrictions Weight Bearing Restrictions: No   Pertinent Vitals/Pain HR's up into the high 150's throughout treatment. RN aware and encouraged ambulation with rest breaks, although limited distance. Upon sitting, pt with HRs ~140.     Mobility  Bed Mobility Bed Mobility: Rolling Left;Left Sidelying to Sit;Sitting - Scoot to Edge of Bed Rolling Left: 4: Min assist;With rail Left Sidelying to Sit: 4: Min assist;With rails Sitting - Scoot to Edge of Bed: 5: Supervision Details for Bed Mobility Assistance: VC for proper sequencing to maintain back precautions. Min assist through trunk for support during bed mobility Transfers Transfers: Sit to Stand;Stand to Sit Sit to Stand: 4: Min guard;With upper extremity assist;From bed Stand to Sit: 4: Min guard;With upper extremity assist;To chair/3-in-1 Details for Transfer Assistance: VC for safe hand placement to/from RW.  Ambulation/Gait Ambulation/Gait Assistance:  4: Min guard Ambulation Distance (Feet): 100 Feet Assistive device: Rolling walker Ambulation/Gait Assistance Details: VC for safe distance to RW as well as for postural cues throughout. Pts ambulation distance limited secondary to increased HR with activity. Gait Pattern: Step-through pattern;Decreased stride length;Trunk flexed;Narrow base of support Gait velocity: decreased gait speed Stairs: No    Exercises     PT Diagnosis:    PT Problem List:   PT Treatment Interventions:     PT Goals Acute Rehab PT Goals PT Goal: Rolling Supine to Right Side - Progress: Progressing toward goal PT Goal: Rolling Supine to Left Side - Progress: Progressing toward goal PT Goal: Supine/Side to Sit - Progress: Progressing toward goal PT Goal: Sit to Supine/Side - Progress: Progressing toward goal PT Goal: Sit to Stand - Progress: Progressing toward goal PT Goal: Stand to Sit - Progress: Progressing toward goal PT Transfer Goal: Bed to Chair/Chair to Bed - Progress: Progressing toward goal PT Goal: Ambulate - Progress: Progressing toward goal  Visit Information  Last PT Received On: 10/04/11 Assistance Needed: +1    Subjective Data      Cognition  Overall Cognitive Status: Appears within functional limits for tasks assessed/performed Arousal/Alertness: Awake/alert Orientation Level: Appears intact for tasks assessed Behavior During Session: [facility] for tasks performed    Balance     End of Session PT - End of Session Equipment Utilized During Treatment: Gait belt;Back brace Activity Tolerance: Patient tolerated treatment well;Treatment limited secondary to medical complications (Comment) Patient left: in chair;with call bell/phone within reach;with nursing in room Nurse Communication: Mobility status   GP     Milana Kidney 10/04/2011, 5:16 PM

## 2011-10-04 NOTE — Progress Notes (Signed)
CCM called about pt's aerobic blood culture being positive for gram negative rods and got confirmation that pt is covered on current antibiotics - zosyn and vancomycin.

## 2011-10-04 NOTE — Progress Notes (Signed)
Pt transferred to 4N room 6 from 3100 via bed. Pt A&O. Steri strips to back intact with scant old drainage. VS WNL. Assessment completed. Repositioned pt.  Oriented pt to room and placed call bell in reach. Will continue to monitor pt closely. Report received from 4N charge nurse.  Juliane Lack, RN

## 2011-10-05 ENCOUNTER — Encounter (HOSPITAL_COMMUNITY): Payer: Self-pay | Admitting: Pulmonary Disease

## 2011-10-05 DIAGNOSIS — D62 Acute posthemorrhagic anemia: Secondary | ICD-10-CM | POA: Insufficient documentation

## 2011-10-05 DIAGNOSIS — F329 Major depressive disorder, single episode, unspecified: Secondary | ICD-10-CM

## 2011-10-05 DIAGNOSIS — A419 Sepsis, unspecified organism: Secondary | ICD-10-CM

## 2011-10-05 DIAGNOSIS — F3289 Other specified depressive episodes: Secondary | ICD-10-CM

## 2011-10-05 DIAGNOSIS — M4120 Other idiopathic scoliosis, site unspecified: Secondary | ICD-10-CM

## 2011-10-05 LAB — BASIC METABOLIC PANEL
CO2: 30 mEq/L (ref 19–32)
GFR calc non Af Amer: >90 mL/min (ref >90)
Glucose, Bld: 90 mg/dL (ref 70–99)
Potassium: 3.7 mEq/L (ref 3.5–5.1)
Sodium: 135 mEq/L (ref 135–145)

## 2011-10-05 LAB — CBC
Hemoglobin: 8.3 g/dL — ABNORMAL LOW (ref 13.0–17.0)
Platelets: 243 10*3/uL (ref 150–400)
RBC: 2.65 MIL/uL — ABNORMAL LOW (ref 4.22–5.81)

## 2011-10-05 MED ORDER — FERROUS SULFATE 325 (65 FE) MG PO TABS
325.0000 mg | ORAL_TABLET | Freq: Two times a day (BID) | ORAL | Status: DC
  Administered 2011-10-05 – 2011-10-06 (×2): 325 mg via ORAL
  Filled 2011-10-05 (×4): qty 1

## 2011-10-05 NOTE — Progress Notes (Signed)
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 Open reduction of deformity & T7-S1 posterior lateral segmental arthrodesis on 8/19.  Pt seen by [facility] 8/22 for SIRS, resp distress.     Lines / Drains: 8/19 wound drain >>  Cultures: Blood 8/22 >>gram neg rods 2/2>>>  Antibiotics: 8/22 vanc  (sepsis)>>8/24 8/22 zosyn (sepsis, ?source gram neg rods in blood)>>  Tests / Events: CTA chest >>neg for PE   Vital Signs: Temp:  [97.5 F (36.4 C)-98.4 F (36.9 C)] 97.9 F (36.6 C) (08/25 0444) Pulse Rate:  [100-149] 113  (08/25 0444) Resp:  [15-26] 18  (08/25 0444) BP: (118-155)/(56-89) 149/89 mmHg (08/25 0444) SpO2:  [92 %-99 %] 98 % (08/25 0829) I/O last 3 completed shifts: In: 1085.5 [P.O.:120; I.V.:378; IV Piggyback:587.5] Out: 1725 [Urine:1725]  Physical Examination: Gen. More alert. ENT - no lesions, no post nasal drip Neck: No JVD, no thyromegaly, no carotid bruits Lungs clearer  Cardiovascular: Rhythm regular, heart sounds  normal, no murmurs, no peripheral edema Abdomen: soft and non-tender, no hepatosplenomegaly, BS normal. Musculoskeletal: No deformities, no cyanosis or clubbing, hemovac with sanguinous drainage, no tenderness over wound Neuro:  alert, non focal Skin:  Warm, no lesions/ rash  On nasal cannulae   Labs and Imaging:  Reviewed.  Please refer to the Assessment and Plan section for relevant results.  CBC    Component Value Date/Time   WBC 5.9 10/05/2011 0620   RBC 2.65* 10/05/2011 0620   HGB 8.3* 10/05/2011 0620   HCT 23.3* 10/05/2011 0620   PLT 243 10/05/2011 0620   MCV 87.9 10/05/2011 0620   MCH 31.3 10/05/2011 0620   MCHC 35.6 10/05/2011 0620   RDW 13.6 10/05/2011 0620   LYMPHSABS 2.7 05/22/2011 1421   MONOABS 0.5 05/22/2011 1421   EOSABS 0.1 05/22/2011 1421   BASOSABS 0.0 05/22/2011 1421    BMET    Component Value Date/Time   NA 135 10/05/2011 0620   K 3.7 10/05/2011 0620   CL 97 10/05/2011 0620   CO2 30 10/05/2011 0620   GLUCOSE 90 10/05/2011 0620   GLUCOSE 88 12/10/2009   BUN 5* 10/05/2011 0620   CREATININE 0.58 10/05/2011 0620   CALCIUM 9.1 10/05/2011 0620   GFRNONAA >90 10/05/2011 0620   GFRAA >90 10/05/2011 5621    pCXR - atx at bases improved ,  infilt RUL, CTA chest neg for PE  Assessment and Plan   Acute respiratory failure with hypoxia (10/02/2011) Underlying COPD on home O2 (DrClance- last 12/12)   Assessment: suspect d/t atelectasis and bronchitis, with bacteremia d/t gram neg rods ?source No PE   Plan Titrate oxygen BD therapy - Albut+Ipratrop NEBS Q6h, Symbicort160- 2Bid, & AlbutHFA prn Antibiotic coverage w/ Zosyn IV Ok to tfr to 4N   Sepsis (10/02/2011) - GNR in 2/2 BC, await ident & sens results   Assessment: gram negative rod bacteremia  ?source - no obvious wound infection, but remains possible   Plan:narrow to zosyn Obtain ID consult    Kyphoscoliosis (09/29/2011) - s/p surg   Assessment: post op    Plan: per neurosx, careful w/ pain meds   ANEMIA - Hg=7.8 post op nadir, was 14.5 pre-op...    Plan: start oral Fe, watch CBC   Hx Severe Depression - on  numerous meds per DrPittman, [facility]    Plan:  Continue meds   OK to resume all PO meds.  Best practices / Disposition: -->ICU status>>tr to 4N 8/24 -->full code -->Heparin for DVT Px -->Protonix for GI Px -->PO    Jamaar Howes M 10/05/11 @ 8:32AM

## 2011-10-05 NOTE — Consult Note (Signed)
[facility]          Day 4 vancomycin        Day 4 piperacillin tazobactam       Reason for Consult: Postoperative Pseudomonas bacteremia    Referring Physician: Dr. Barbaraann Barthel  Principal Problem:  *Bacteremia due to Pseudomonas Active Problems:  Kyphoscoliosis  Acute respiratory failure with hypoxia  Sepsis      . albuterol  2.5 mg Nebulization Q6H  . ARIPiprazole  5 mg Oral Daily  . aspirin EC  81 mg Oral Daily  . budesonide-formoterol  2 puff Inhalation BID  . cholecalciferol  1,000 Units Oral Daily  . diazepam  5 mg Oral QAC breakfast   And  . diazepam  2.5 mg Oral Q1200   And  . diazepam  5 mg Oral QHS  . divalproex  1,000 mg Oral QHS  . docusate sodium  200 mg Oral Daily  . DULoxetine  60 mg Oral Daily  . ferrous sulfate  325 mg Oral BID WC  . fluticasone  2 spray Each Nare Daily  . ipratropium  0.5 mg Nebulization Q6H  . loratadine  10 mg Oral Daily  . omega-3 acid ethyl esters  1 g Oral Daily  . pantoprazole  40 mg Oral Q1200  . PARoxetine  15 mg Oral Daily  . piperacillin-tazobactam (ZOSYN)  IV  3.375 g Intravenous Q8H  . polyethylene glycol  17 g Oral Daily  . primidone  250 mg Oral BID  . senna  1 tablet Oral BID  . simvastatin  40 mg Oral QPM  . sodium chloride  3 mL Intravenous Q12H  . vitamin C  500 mg Oral Daily  . zinc sulfate  220 mg Oral Daily  . DISCONTD: vancomycin  750 mg Intravenous Q8H    Recommendations: 1. Continue piperacillin tazobactam pending antibiotic susceptibility results 2. Discontinue vancomycin   Assessment: I suspect that his Pseudomonas bacteremia was related to IV associated thrombophlebitis. I do not see any other obvious source of infection and I would expect that thrombophlebitis would respond promptly to removal of the IV and appropriate antibiotic therapy. I would plan on about 14 days total therapy. He may be a candidate for conversion to oral ciprofloxacin if his isolate is  susceptible. If not he'll need to complete therapy with IV antibiotics. I doubt that he has wound or vertebral infection.   HPI: Malik Zuniga is a 66 y.o. male with kyphoscoliosis and chronic back pain who was admitted to the hospital on August 19 for repeat vertebral surgery. He had previously undergone L2-S1 decompression and fusion. He continued to have pain so after admission all the old hardware was removed and he underwent fusion from T7-S1. On August 22 he developed sudden fever to 101 associated with mild leukocytosis and respiratory distress. He was transferred to the intensive care unit and started on broad empiric therapy with vancomycin and piperacillin tazobactam after blood cultures were obtained. Chest x-ray and chest CT scan did not show any evidence of pulmonary embolus or clear-cut pneumonia. He has not had any unusual changes in his wound to suggest wound infection. His urinalyses was unremarkable and he has not had any urinary symptoms.  He improved promptly with supplemental oxygen and empiric antibiotics. He has had no further fevers and has been transferred out of the intensive care unit and is feeling much better. He denies having any significant back pain. 2 of 2 blood cultures  are growing Pseudomonas. His wife recalls that he had an IV in his right hand are to becoming ill. Shortly after transfer to the intensive care unit he developed significant swelling of the hand and he now has a slightly tender or red cord where the IV had been.   Review of Systems: Pertinent items are noted in HPI.  Past Medical History  Diagnosis Date  . Myocardial infarction   . Hyperlipidemia   . Depression   . Emphysema of lung   . Tremor   . Headache, chronic daily   . History of prostate cancer   . Cancer 2004    prostate  . On home O2   . Aspiration pneumonia   . Stroke     "mini strokes"; denies residual  . Emphysema   . Neuromuscular disorder 1998    right carpal tunnel release   . Hypertension     [employer]  . Anemia associated with acute blood loss     History  Substance Use Topics  . Smoking status: Current Everyday Smoker -- 0.5 packs/day for 50 years    Types: Cigarettes    Last Attempt to Quit: 12/15/2010  . Smokeless tobacco: Never Used  . Alcohol Use: No    Family History  Problem Relation Age of Onset  . Heart disease Mother   . Cancer Father     brain cancer and prostate cancer    Allergies  Allergen Reactions  . Lisinopril Swelling    ANGIOEDEMA  . Lamictal (Lamotrigine) Other (See Comments)    Weakness/difficulty swallowing    OBJECTIVE: Blood pressure 149/89, pulse 113, temperature 97.9 F (36.6 C), temperature source Oral, resp. rate 18, height 5\' 10"  (1.778 m), weight 82.2 kg (181 lb 3.5 oz), SpO2 97.00%. General: He is alert and conversant and in good spirits. He has just completed a breathing treatment. He is sitting up in a chair with a clamshell brace on. Skin: He has some superficial thrombophlebitis on the dorsum of his right hand at the site of his previous IV. He has no rash. Lungs: Clear Cor: Distant heart sounds with a regular S1 and S2 with no murmurs Back: His extensive incision looks good. Steri-Strips are in place and he has no unusual inflammation or drainage.  Microbiology: Recent Results (from the past 240 hour(s))  CULTURE, BLOOD (ROUTINE X 2)     Status: Normal (Preliminary result)   Collection Time   10/02/11  7:42 PM      Component Value Range Status Comment   Specimen Description BLOOD RIGHT HAND   Final    Special Requests BOTTLES DRAWN AEROBIC ONLY 5CC   Final    Culture  Setup Time 10/03/2011 03:03   Final    Culture     Final    Value: GRAM NEGATIVE RODS     Note: Gram Stain Report Called to,Read Back By and Verified With: VIET VO 10/04/2011 2:46AM YIMSU   Report Status PENDING   Incomplete   CULTURE, BLOOD (ROUTINE X 2)     Status: Normal (Preliminary result)   Collection Time   10/02/11  7:43 PM        Component Value Range Status Comment   Specimen Description BLOOD LEFT HAND   Final    Special Requests BOTTLES DRAWN AEROBIC ONLY [facility]   Final    Culture  Setup Time 10/03/2011 03:03   Final    Culture     Final    Value: PSEUDOMONAS AERUGINOSA  Note: Gram Stain Report Called to,Read Back By and Verified With: VIET VO 10/04/2011 2:46AM YIMSU   Report Status PENDING   Incomplete     Cliffton Asters, MD [facility] [phone] pager   [phone] cell 10/05/2011, 2:13 PM

## 2011-10-05 NOTE — Progress Notes (Signed)
Patient ID: Malik Zuniga, male   DOB: January 26, 1946, 66 y.o.   MRN: [id number] BP 149/89  Pulse 113  Temp 97.9 F (36.6 C) (Oral)  Resp 18  Ht 5\' 10"  (1.778 m)  Wt 82.2 kg (181 lb 3.5 oz)  BMI 26.00 kg/m2  SpO2 98% Alert and oriented x 4 Excellent strength in lower extremities Wound is clean and dry. No signs of infection Will consult ID today, continues on zosyn for gnr in blood culture.

## 2011-10-05 NOTE — Progress Notes (Deleted)
ANTIBIOTIC CONSULT NOTE - FOLLOW UP  Pharmacy Consult:  Zosyn Indication:  Wound infection  Allergies  Allergen Reactions  . Lisinopril Swelling    ANGIOEDEMA  . Lamictal (Lamotrigine) Other (See Comments)    Weakness/difficulty swallowing    Patient Measurements: Height: 5\' 10"  (177.8 cm) Weight: 181 lb 3.5 oz (82.2 kg) IBW/kg (Calculated) : 73   Vital Signs: Temp: 97.9 F (36.6 C) (08/25 0444) Temp src: Oral (08/25 0444) BP: 149/89 mmHg (08/25 0444) Pulse Rate: 113  (08/25 0444) Intake/Output from previous day: 08/24 0701 - 08/25 0700 In: 392.5 [I.V.:180; IV Piggyback:212.5] Out: 750 [Urine:750]  Labs:  [facility] 10/04/11 0420 10/02/11 1830  WBC 6.2 3.6*  HGB 7.8* 8.8*  PLT 190 177  LABCREA -- --  CREATININE 0.59 0.53   Estimated Creatinine Clearance: 93.8 ml/min (by C-G formula based on Cr of 0.59). No results found for this basename: VANCOTROUGH:2,VANCOPEAK:2,VANCORANDOM:2,GENTTROUGH:2,GENTPEAK:2,GENTRANDOM:2,TOBRATROUGH:2,TOBRAPEAK:2,TOBRARND:2,AMIKACINPEAK:2,AMIKACINTROU:2,AMIKACIN:2, in the last 72 hours   Microbiology: Recent Results (from the past 720 hour(s))  SURGICAL PCR SCREEN     Status: Abnormal   Collection Time   09/23/11  9:15 AM      Component Value Range Status Comment   MRSA, PCR NEGATIVE  NEGATIVE Final    Staphylococcus aureus POSITIVE (*) NEGATIVE Final   CULTURE, BLOOD (ROUTINE X 2)     Status: Normal (Preliminary result)   Collection Time   10/02/11  7:42 PM      Component Value Range Status Comment   Specimen Description BLOOD RIGHT HAND   Final    Special Requests BOTTLES DRAWN AEROBIC ONLY 5CC   Final    Culture  Setup Time 10/03/2011 03:03   Final    Culture     Final    Value: GRAM NEGATIVE RODS     Note: Gram Stain Report Called to,Read Back By and Verified With: VIET VO 10/04/2011 2:46AM YIMSU   Report Status PENDING   Incomplete   CULTURE, BLOOD (ROUTINE X 2)     Status: Normal (Preliminary result)   Collection Time   10/02/11  7:43 PM      Component Value Range Status Comment   Specimen Description BLOOD LEFT HAND   Final    Special Requests BOTTLES DRAWN AEROBIC ONLY 2CC   Final    Culture  Setup Time 10/03/2011 03:03   Final    Culture     Final    Value: GRAM NEGATIVE RODS     Note: Gram Stain Report Called to,Read Back By and Verified With: VIET VO 10/04/2011 2:46AM YIMSU   Report Status PENDING   Incomplete       Assessment: 66 YOM started on empiric Zosyn for SIRS s/p lumbar fusion with removal of hardware on 09/29/11.  Patient now with GNR growing in blood culture.  His renal function remains appropriate for Zosyn dosing.  8/22 Vanc >>8/24 8/22 Zosyn >>  8/22: Blood: GNR    Plan: - Continue Zosyn 3.375gm IV Q8h, each dose infused over 4 hours. - Will monitor C/S, renal fxn and clinical status daily - Monitor vitals and the need to resume home anti-hypertensives       Mariaguadalupe Fialkowski D. Laney Potash, PharmD, BCPS Pager:  [phone] 10/05/2011, 7:47 AM

## 2011-10-05 NOTE — Progress Notes (Signed)
ANTIBIOTIC CONSULT NOTE - FOLLOW UP  Pharmacy Consult:  Zosyn Indication:  GNR bacteremia  Allergies  Allergen Reactions  . Lisinopril Swelling    ANGIOEDEMA  . Lamictal (Lamotrigine) Other (See Comments)    Weakness/difficulty swallowing    Patient Measurements: Height: 5\' 10"  (177.8 cm) Weight: 181 lb 3.5 oz (82.2 kg) IBW/kg (Calculated) : 73   Vital Signs: Temp: 97.9 F (36.6 C) (08/25 0444) Temp src: Oral (08/25 0444) BP: 149/89 mmHg (08/25 0444) Pulse Rate: 113  (08/25 0444) Intake/Output from previous day: 08/24 0701 - 08/25 0700 In: 392.5 [I.V.:180; IV Piggyback:212.5] Out: 750 [Urine:750]  Labs:  [facility] 10/04/11 0420 10/02/11 1830  WBC 6.2 3.6*  HGB 7.8* 8.8*  PLT 190 177  LABCREA -- --  CREATININE 0.59 0.53   Estimated Creatinine Clearance: 93.8 ml/min (by C-G formula based on Cr of 0.59). No results found for this basename: VANCOTROUGH:2,VANCOPEAK:2,VANCORANDOM:2,GENTTROUGH:2,GENTPEAK:2,GENTRANDOM:2,TOBRATROUGH:2,TOBRAPEAK:2,TOBRARND:2,AMIKACINPEAK:2,AMIKACINTROU:2,AMIKACIN:2, in the last 72 hours   Microbiology: Recent Results (from the past 720 hour(s))  SURGICAL PCR SCREEN     Status: Abnormal   Collection Time   09/23/11  9:15 AM      Component Value Range Status Comment   MRSA, PCR NEGATIVE  NEGATIVE Final    Staphylococcus aureus POSITIVE (*) NEGATIVE Final   CULTURE, BLOOD (ROUTINE X 2)     Status: Normal (Preliminary result)   Collection Time   10/02/11  7:42 PM      Component Value Range Status Comment   Specimen Description BLOOD RIGHT HAND   Final    Special Requests BOTTLES DRAWN AEROBIC ONLY 5CC   Final    Culture  Setup Time 10/03/2011 03:03   Final    Culture     Final    Value: GRAM NEGATIVE RODS     Note: Gram Stain Report Called to,Read Back By and Verified With: VIET VO 10/04/2011 2:46AM YIMSU   Report Status PENDING   Incomplete   CULTURE, BLOOD (ROUTINE X 2)     Status: Normal (Preliminary result)   Collection Time   10/02/11   7:43 PM      Component Value Range Status Comment   Specimen Description BLOOD LEFT HAND   Final    Special Requests BOTTLES DRAWN AEROBIC ONLY 2CC   Final    Culture  Setup Time 10/03/2011 03:03   Final    Culture     Final    Value: GRAM NEGATIVE RODS     Note: Gram Stain Report Called to,Read Back By and Verified With: VIET VO 10/04/2011 2:46AM YIMSU   Report Status PENDING   Incomplete       Assessment: 66 YOM started on empiric Zosyn for SIRS s/p lumbar fusion with removal of hardware on 09/29/11.  Patient now with GNR growing in blood culture.  His renal function remains appropriate for Zosyn dosing.  8/22 Vanc >>8/24 8/22 Zosyn >>  8/22: Blood: GNR    Plan: - Continue Zosyn 3.375gm IV Q8h, each dose infused over 4 hours. - Will monitor C/S, renal fxn and clinical status daily - Monitor vitals and the need to resume home anti-hypertensives       Malik Zuniga, PharmD, BCPS Pager:  [phone] 10/05/2011, 7:49 AM

## 2011-10-06 DIAGNOSIS — B965 Pseudomonas (aeruginosa) (mallei) (pseudomallei) as the cause of diseases classified elsewhere: Secondary | ICD-10-CM

## 2011-10-06 LAB — CULTURE, BLOOD (ROUTINE X 2)

## 2011-10-06 MED ORDER — CIPROFLOXACIN HCL 500 MG PO TABS
500.0000 mg | ORAL_TABLET | Freq: Two times a day (BID) | ORAL | Status: DC
  Administered 2011-10-06: 500 mg via ORAL
  Filled 2011-10-06 (×3): qty 1

## 2011-10-06 MED ORDER — METOPROLOL SUCCINATE ER 50 MG PO TB24
50.0000 mg | ORAL_TABLET | Freq: Every day | ORAL | Status: DC
  Administered 2011-10-06: 50 mg via ORAL
  Filled 2011-10-06 (×3): qty 1

## 2011-10-06 MED ORDER — IPRATROPIUM BROMIDE 0.02 % IN SOLN: 0.5000 mg | Freq: Three times a day (TID) | RESPIRATORY_TRACT | Status: DC

## 2011-10-06 MED ORDER — ALBUTEROL SULFATE (5 MG/ML) 0.5% IN NEBU: 2.5000 mg | INHALATION_SOLUTION | Freq: Three times a day (TID) | RESPIRATORY_TRACT | Status: DC

## 2011-10-06 MED ORDER — ALBUTEROL SULFATE (5 MG/ML) 0.5% IN NEBU: 2.5000 mg | INHALATION_SOLUTION | RESPIRATORY_TRACT | Status: DC | PRN

## 2011-10-06 MED ORDER — ALBUTEROL SULFATE HFA 108 (90 BASE) MCG/ACT IN AERS: 2.0000 | INHALATION_SPRAY | RESPIRATORY_TRACT | Status: DC | PRN

## 2011-10-06 MED ORDER — CIPROFLOXACIN HCL 500 MG PO TABS: 500.0000 mg | ORAL_TABLET | Freq: Two times a day (BID) | ORAL | Status: AC

## 2011-10-06 NOTE — Progress Notes (Addendum)
Pt's heart rate increased up into 180's when working with therapy, after resting slowed into 150's. Stating now feels warm not quit normal.

## 2011-10-06 NOTE — Progress Notes (Addendum)
[facility]    Subjective: No new complaints   Antibiotics:  Anti-infectives     Start     Dose/Rate Route Frequency Ordered Stop   10/06/11 0900   ciprofloxacin (CIPRO) tablet 500 mg        500 mg Oral 2 times daily 10/06/11 0846     10/03/11 0600   vancomycin (VANCOCIN) 750 mg in sodium chloride 0.9 % 150 mL IVPB  Status:  Discontinued        750 mg 150 mL/hr over 60 Minutes Intravenous Every 8 hours 10/02/11 2001 10/04/11 1653   10/03/11 0000   piperacillin-tazobactam (ZOSYN) IVPB 3.375 g  Status:  Discontinued        3.375 g 12.5 mL/hr over 240 Minutes Intravenous 4 times per day 10/02/11 2101 10/02/11 2104   10/02/11 2200   vancomycin (VANCOCIN) IVPB 1000 mg/200 mL premix        1,000 mg 200 mL/hr over 60 Minutes Intravenous  Once 10/02/11 2001 10/02/11 2300   10/02/11 2200   piperacillin-tazobactam (ZOSYN) IVPB 3.375 g  Status:  Discontinued        3.375 g 12.5 mL/hr over 240 Minutes Intravenous 3 times per day 10/02/11 2105 10/06/11 0846   09/29/11 2100   ceFAZolin (ANCEF) IVPB 1 g/50 mL premix        1 g 100 mL/hr over 30 Minutes Intravenous Every 8 hours 09/29/11 1648 09/30/11 0550   09/29/11 1300   ceFAZolin (ANCEF) 2-3 GM-% IVPB SOLR     Comments: DAY, DORY: cabinet override         09/29/11 1300 09/30/11 0059   09/29/11 1051   bacitracin 50,000 Units in sodium chloride irrigation 0.9 % 500 mL irrigation  Status:  Discontinued          As needed 09/29/11 1051 09/29/11 1627   09/29/11 0930   bacitracin 16109 UNITS injection     Comments: DAY, DORY: cabinet override         09/29/11 0930 09/29/11 2129   09/29/11 0808   ceFAZolin (ANCEF) IVPB 2 g/50 mL premix        2 g 100 mL/hr over 30 Minutes Intravenous 60 min pre-op 09/29/11 0808 09/29/11 1308   09/29/11 0807   ceFAZolin (ANCEF) IVPB 1 g/50 mL premix  Status:  Discontinued        1 g 100 mL/hr over 30 Minutes Intravenous 60 min pre-op 09/29/11 0807 09/29/11 0807          Medications: Scheduled Meds:   . albuterol  2.5 mg Nebulization Q6H  . ARIPiprazole  5 mg Oral Daily  . aspirin EC  81 mg Oral Daily  . budesonide-formoterol  2 puff Inhalation BID  . cholecalciferol  1,000 Units Oral Daily  . ciprofloxacin  500 mg Oral BID  . diazepam  5 mg Oral QAC breakfast   And  . diazepam  2.5 mg Oral Q1200   And  . diazepam  5 mg Oral QHS  . divalproex  1,000 mg Oral QHS  . docusate sodium  200 mg Oral Daily  . DULoxetine  60 mg Oral Daily  . ferrous sulfate  325 mg Oral BID WC  . fluticasone  2 spray Each Nare Daily  . ipratropium  0.5 mg Nebulization Q6H  . loratadine  10 mg Oral Daily  . omega-3 acid ethyl esters  1 g Oral Daily  . pantoprazole  40 mg Oral Q1200  . PARoxetine  15 mg Oral Daily  . polyethylene glycol  17 g Oral Daily  . primidone  250 mg Oral BID  . senna  1 tablet Oral BID  . simvastatin  40 mg Oral QPM  . sodium chloride  3 mL Intravenous Q12H  . vitamin C  500 mg Oral Daily  . zinc sulfate  220 mg Oral Daily  . DISCONTD: piperacillin-tazobactam (ZOSYN)  IV  3.375 g Intravenous Q8H   Continuous Infusions:   . sodium chloride 250 mL (09/28/11 1500)  . sodium chloride 20 mL/hr at 10/04/11 0600   PRN Meds:.acetaminophen, acetaminophen, albuterol, alum & mag hydroxide-simeth, bisacodyl, diazepam, fentaNYL, HYDROcodone-acetaminophen, menthol-cetylpyridinium, nitroGLYCERIN, ondansetron (ZOFRAN) IV, phenol, polyethylene glycol, sodium chloride, zolpidem   Objective: Weight change:   Intake/Output Summary (Last 24 hours) at 10/06/11 0847 Last data filed at 10/06/11 0622  Gross per 24 hour  Intake      0 ml  Output   1300 ml  Net  -1300 ml   Blood pressure 143/71, pulse 103, temperature 97.8 F (36.6 C), temperature source Oral, resp. rate 18, height 5\' 10"  (1.778 m), weight 181 lb 3.5 oz (82.2 kg), SpO2 96.00%. Temp:  [97.8 F (36.6 C)-98.2 F (36.8 C)] 97.8 F (36.6 C) (08/26 0619) Pulse Rate:  [93-115] 103  (08/26  0619) Resp:  [16-19] 18  (08/26 0619) BP: (126-143)/(65-78) 143/71 mmHg (08/26 0619) SpO2:  [92 %-97 %] 96 % (08/26 1610)  Physical Exam: General: Alert and awake, oriented x3, not in any acute distress. HEENT: anicteric sclera, pupils reactive to light and accommodation, EOMI CVS regular rate, normal r,  no murmur rubs or gallops Chest: clear to auscultation bilaterally, no wheezing, rales or rhonchi Abdomen: soft nontender, nondistended, normal bowel sounds, Extremities: no  clubbing or edema noted bilaterally Skin: surgical site is clean. Right hand with minimal erythema and tenderness at iv site Neuro: nonfocal  Lab Results:  [facility] 10/05/11 0620 10/04/11 0420  WBC 5.9 6.2  HGB 8.3* 7.8*  HCT 23.3* 21.8*  PLT 243 190    BMET  Basename 10/05/11 0620 10/04/11 0420  NA 135 134*  K 3.7 3.3*  CL 97 98  CO2 30 26  GLUCOSE 90 103*  BUN 5* 5*  CREATININE 0.58 0.59  CALCIUM 9.1 8.3*    Micro Results: Recent Results (from the past 240 hour(s))  CULTURE, BLOOD (ROUTINE X 2)     Status: Normal   Collection Time   10/02/11  7:42 PM      Component Value Range Status Comment   Specimen Description BLOOD RIGHT HAND   Final    Special Requests BOTTLES DRAWN AEROBIC ONLY 5CC   Final    Culture  Setup Time 10/03/2011 03:03   Final    Culture     Final    Value: KLEBSIELLA PNEUMONIAE     Note: Gram Stain Report Called to,Read Back By and Verified With: VIET VO 10/04/2011 2:46AM YIMSU   Report Status 10/06/2011 FINAL   Final    Organism ID, Bacteria KLEBSIELLA PNEUMONIAE   Final   CULTURE, BLOOD (ROUTINE X 2)     Status: Normal   Collection Time   10/02/11  7:43 PM      Component Value Range Status Comment   Specimen Description BLOOD LEFT HAND   Final    Special Requests BOTTLES DRAWN AEROBIC ONLY [facility]   Final    Culture  Setup Time 10/03/2011 03:03   Final    Culture  Final    Value: PSEUDOMONAS AERUGINOSA     Note: Gram Stain Report Called to,Read Back By and Verified  With: VIET VO 10/04/2011 2:46AM YIMSU   Report Status 10/06/2011 FINAL   Final    Organism ID, Bacteria PSEUDOMONAS AERUGINOSA   Final     Studies/Results: No results found.    Assessment/Plan: Malik Zuniga is a 66 y.o. male with  Postoperative fevers and gram negative bacteremia. I checked epic and called [facility] and Lab confirms he appears to have TWO DIFFERENT organisms growing from blood on the 22nd (pseudmonas and E coli).  Asked them to double check this. Doubt he has true polymicrobial blood stream infection. ? Which one is the true pathogen.  1) GNR bacteremia: since both organisms are sensitive to cipro will change to cipro oral 500 bid and rx for 10 more days  2) postop fevers: assumption is due to #1 but given odd data of different organisms worry a bit about this.   I will plan on seeing him in next  in next 3 to 4 weeks in my clinic to see how he is doing off antibiotics.  I will sign off for now please call with further questions.     LOS: 7 days   Acey Lav 10/06/2011, 8:47 AM

## 2011-10-06 NOTE — Progress Notes (Signed)
Physical Therapy Treatment Patient Details Name: FRANCIS DOENGES MRN: [id number] DOB: 28-Jan-1946 Today's Date: 10/06/2011 Time: 0902-0932 PT Time Calculation (min): 30 min  PT Assessment / Plan / Recommendation Comments on Treatment Session  Pt's wife present for session and educated about proper brace donning/doffing. Pt's ambulation continues to be limited by increased HR. Pt's HR was 114 lying in bed prior to any activity and increaed to 150's during ambulation, going down to 124 during rest  break.  After second ambulation attempt, pt's BP was 149/101. Session ended. RN notified, pt asymptomatic. Pt reports possible discharge this afternoon, question pt's safety due to increased HR botha t rest and with activity.    Follow Up Recommendations  Home health PT    Barriers to Discharge        Equipment Recommendations  None recommended by PT    Recommendations for Other Services    Frequency Min 5X/week   Plan Discharge plan remains appropriate;Frequency remains appropriate    Precautions / Restrictions Precautions Precautions: Back Spinal Brace: Thoracolumbosacral orthotic;Applied in sitting position Restrictions Weight Bearing Restrictions: No   Pertinent Vitals/Pain Pt's HR elevated at rest 114 prior to any mobility. HR elevated to 150's during ambulation. BP 141/101. MAP 114.    Mobility  Bed Mobility Bed Mobility: Rolling Right;Right Sidelying to Sit;Sitting - Scoot to Edge of Bed Rolling Right: 4: Min assist;With rail Right Sidelying to Sit: 4: Min assist;HOB elevated;With rails Sitting - Scoot to Edge of Bed: 5: Supervision Details for Bed Mobility Assistance: Pt required verbal cueing for maintaining back precautions and sequencing of log rolling, and min assist for facilitation at pelvis and upper trunk during rolling and at upper trunk to come to full upright sit. Transfers Transfers: Sit to Stand;Stand to Sit Sit to Stand: With upper extremity assist;From bed;4: Min  guard Stand to Sit: 4: Min guard;With upper extremity assist;To chair/3-in-1 Details for Transfer Assistance: Pt required verbal cueing for safe hand palcement during transfers. Upon standing, pt's HR was in 140's and pt was instructed in relaxation techniques (deep breathing, visualizing camling place) which reduced HR to 130 prior to ambulation. Ambulation/Gait Ambulation/Gait Assistance: 4: Min guard Ambulation Distance (Feet): 10 Feet Assistive device: Rolling walker Ambulation/Gait Assistance Details: Pt requried verbal cueing for upright posture. Pt was able to ambulate approx 36ft before HR increased to 156 and pt took seated rest break for approx 5 minutes and was encouraged to use relaxation techniques. Pt's HR came down to 120's and ambulated approx 5 ft  when HR climbed back into 150's, reaching 158 just prior to sitting. BP was 149/101. Nursing was notified. Gait Pattern: Step-through pattern;Decreased stride length;Trunk flexed;Narrow base of support;Lateral trunk lean to right General Gait Details: slight shift of hips to the left with right lateral lean Stairs: No      PT Goals Acute Rehab PT Goals PT Goal: Rolling Supine to Right Side - Progress: Progressing toward goal PT Goal: Supine/Side to Sit - Progress: Progressing toward goal PT Goal: Sit to Stand - Progress: Progressing toward goal PT Goal: Stand to Sit - Progress: Progressing toward goal PT Goal: Ambulate - Progress: Progressing toward goal Additional Goals PT Goal: Additional Goal #1 - Progress: Progressing toward goal  Visit Information  Last PT Received On: 10/06/11 Assistance Needed: +2 (for IV pole/chair follow)    Subjective Data  Subjective: "I am feeling much better."   Cognition  Overall Cognitive Status: Appears within functional limits for tasks assessed/performed Arousal/Alertness: Awake/alert Orientation Level: Appears intact  for tasks assessed Behavior During Session: [facility] for tasks performed     Balance     End of Session PT - End of Session Equipment Utilized During Treatment: Gait belt;Back brace Activity Tolerance: Patient limited by fatigue;Treatment limited secondary to medical complications (Comment) (elevated HR and BP) Patient left: in chair;with call bell/phone within reach;with family/visitor present Nurse Communication: Mobility status   GP     Ted Leonhart 10/06/2011, 9:55 AM

## 2011-10-06 NOTE — Discharge Summary (Signed)
Physician Discharge Summary  Patient ID: Malik Zuniga MRN: [id number] DOB/AGE: 10/10/1945 66 y.o.  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 Postoperatively he has done recently well with regard to his surgery. His pain has been improved. He's able to stand more upright and is able to ambulate better than preop. His pain level is been under control. Postoperatively the patient was nearing discharge when he suffered a episode of hypotension and decreased level consciousness. Workup demonstrates evidence of gram-negative rods bacteremia from an unknown source. Patient is no evidence of pulmonary embolus active infection by multiple workup but he did improve with IV antibiotics and currently has been sit switched to by mouth Cipro as recommended by the infectious disease service without difficulty. Currently he is tolerating regular diet. He is pistoning and therapies.. He still somewhat orthostatic but very strongly wants to go home and does not wish to stay in the hospital and longer. I think that with continued home therapy and close observation he should be okay at home. I limited go ahead and discharge him home.  During the course of his hypotensive episode patient underwent a CT scan of his chest for evaluation pulmonary embolus. That time patient was discovered to have a medial directed screw at the T12 level with some breach into the spinal canal. The spinal canal at this level was quite capacious. The spinal cord I does not appear to be underneath compression. And the patient has absolutely positively no symptoms of radiculopathy or  myelopathy. Given the options at this time I think were best off just observing this. I discussed situation with the patient. Informed him that the screw at T12 is medially directed but I do not think it symptomatic and are only option for reposition would be to reexplore the entire fusion and neither he nor I think that is viable given his current status.  Consults: pulmonary/intensive care  Significant Diagnostic Studies:   Treatments:   Discharge Exam: Blood pressure 131/75, pulse 104, temperature 97.7 F (36.5 C), temperature source Oral, resp. rate 18, height 5\' 10"  (1.778 m), weight 82.2 kg (181 lb 3.5 oz), SpO2 97.00%. Awake and alert oriented and appropriate. Cranial nerve function is intact. Motor and sensory function in the extremities is normal. Wound clean dry and intact. Chest and abdomen are reasonably benign.  Disposition: 01-Home or Self Care  Discharge Orders    Future Appointments: Provider: Department: Dept Phone: Center:   10/27/2011 3:15 PM Barbaraann Share, MD [facility] [phone] None   11/04/2011 2:00 PM Randall Hiss, MD [facility] [phone] [facility]     Future Orders Please Complete By Expires   Home Health      Questions: Responses:   To provide the following care/treatments PT    OT   Face-to-face encounter      Comments:   I Haelyn Forgey A certify that this patient is under my care and that I, or a nurse practitioner or physician's assistant working with me, had a face-to-face encounter that meets the physician face-to-face encounter requirements with this patient on 10/02/2011.   Questions: Responses:   The encounter with the patient was in whole, or in part, for  the following medical condition, which is the primary reason for home health care Kyphoscoliosis status post fusion surgery   I certify that, based on my findings, the following services are medically necessary home health services Physical therapy   My clinical findings  support the need for the above services High Risk for rehospitalization   Further, I certify that my clinical findings support that this patient is homebound due to: Ambulates short distances less than 300 feet    Pain interferes with ambulation/mobility   To provide the following care/treatments PT    OT     Medication List  As of 10/06/2011  1:28 PM   TAKE these medications         acetaminophen 325 MG tablet   Commonly known as: TYLENOL   Take 650 mg by mouth every 6 (six) hours as needed. For pain      albuterol 108 (90 BASE) MCG/ACT inhaler   Commonly known as: PROVENTIL HFA;VENTOLIN HFA   Inhale 2 puffs into the lungs every 6 (six) hours as needed. For shortness of breath      ALKA-SELTZER PLUS COLD & COUGH 06-11-08-325 MG Caps   Generic drug: Phenyleph-CPM-DM-APAP   Take 2 tablets by mouth daily.      ARIPiprazole 5 MG tablet   Commonly known as: ABILIFY   Take 5 mg by mouth daily.      aspirin 81 MG EC tablet   Take 81 mg by mouth daily.      aspirin-acetaminophen-caffeine 250-250-65 MG per tablet   Commonly known as: EXCEDRIN MIGRAINE   Take 2 tablets by mouth every 6 (six) hours as needed. For headaches      budesonide-formoterol 160-4.5 MCG/ACT inhaler   Commonly known as: SYMBICORT   Inhale 2 puffs into the lungs 2 (two) times daily.      cholecalciferol 1000 UNITS tablet   Commonly known as: VITAMIN D   Take 1,000 Units by mouth daily.      ciprofloxacin 500 MG tablet   Commonly known as: CIPRO   Take 1 tablet (500 mg total) by mouth 2 (two) times daily.      diazepam 5 MG tablet   Commonly known as: VALIUM   Take 2.5-5 mg by mouth 3 (three) times daily. For anxiety (1 in the am, 1/2 around noon, 1 at night)      diazepam 5 MG tablet   Commonly known as: VALIUM   Take 1 tablet (5 mg total) by mouth daily before breakfast.      diazepam 5 MG tablet   Commonly known as: VALIUM   Take 1-2 tablets (5-10 mg total) by mouth every 6 (six) hours as needed.        divalproex 500 MG 24 hr tablet   Commonly known as: DEPAKOTE ER   Take 1,000 mg by mouth at bedtime.      docusate sodium 100 MG capsule   Commonly known as: COLACE   Take 200 mg by mouth daily.      DULoxetine 60 MG capsule   Commonly known as: CYMBALTA   Take 60 mg by mouth daily.      FISH OIL TRIPLE STRENGTH 1400 MG Caps   Take 1 capsule by mouth daily.      fluticasone 50 MCG/ACT nasal spray   Commonly known as: FLONASE   Place 2 sprays into the nose daily.      HYDROcodone-acetaminophen 5-500 MG per tablet   Commonly known as: VICODIN  Take 2 tablets by mouth every 6 (six) hours as needed. For pain      metoprolol succinate 50 MG 24 hr tablet   Commonly known as: TOPROL-XL   Take 50 mg by mouth daily. Take with or immediately following a meal.      nitroGLYCERIN 0.4 MG SL tablet   Commonly known as: NITROSTAT   Place 0.4 mg under the tongue every 5 (five) minutes as needed. For chest pain      olmesartan-hydrochlorothiazide 20-12.5 MG per tablet   Commonly known as: BENICAR HCT   Take 1 tablet by mouth daily.      omeprazole 40 MG capsule   Commonly known as: PRILOSEC   Take 40 mg by mouth 2 (two) times daily.      oxyCODONE-acetaminophen 5-325 MG per tablet   Commonly known as: PERCOCET/ROXICET   Take 1-2 tablets by mouth every 4 (four) hours as needed.      PARoxetine 30 MG tablet   Commonly known as: PAXIL   Take 15 mg by mouth every morning.      polyethylene glycol packet   Commonly known as: MIRALAX / GLYCOLAX   Take 17 g by mouth daily.      primidone 250 MG tablet   Commonly known as: MYSOLINE   Take 250 mg by mouth 2 (two) times daily.      simvastatin 40 MG tablet   Commonly known as: ZOCOR   Take 40 mg by mouth every evening.      tiotropium 18 MCG inhalation capsule   Commonly known as: SPIRIVA   Place 1 capsule (18 mcg total) into inhaler and inhale daily.      vitamin C 500 MG tablet   Commonly known as: ASCORBIC ACID   Take  500 mg by mouth daily.      Zinc 50 MG Tabs   Take 50 mg by mouth daily.           Follow-up Information    Follow up with Alissah Redmon A, MD. Call in 1 week.   Contact information:   [address], [address] [place] [address] [phone]       Follow up with Barbaraann Share, MD on 10/27/2011. (3:15PM )    Contact information:   [address] [employer] [place] [address] [phone]          Signed: Temple Pacini 10/06/2011, 1:28 PM

## 2011-10-06 NOTE — Progress Notes (Signed)
Occupational Therapy Treatment Patient Details Name: Malik Zuniga MRN: [id number] DOB: 1945-09-07 Today's Date: 10/06/2011 Time: 7829-5621 OT Time Calculation (min): 38 min  OT Assessment / Plan / Recommendation Comments on Treatment Session Pt. progressing well with BADLs.  HR increased to 178 when ambulating to room from bathroom.  and remained in the 160s for at least 20 mins.  Recommend HHOT    Follow Up Recommendations  Home health OT    Barriers to Discharge       Equipment Recommendations  None recommended by OT    Recommendations for Other Services    Frequency Min 2X/week   Plan Discharge plan remains appropriate    Precautions / Restrictions Precautions Precautions: Back Precaution Comments: pt. able to verbalize 3/3 precautions.  requires min cues to adhere to them Required Braces or Orthoses: Spinal Brace Spinal Brace: Thoracolumbosacral orthotic;Applied in sitting position Restrictions Weight Bearing Restrictions: No   Pertinent Vitals/Pain     ADL  Lower Body Bathing: Simulated;Min guard Where Assessed - Lower Body Bathing: Supported sit to stand Lower Body Dressing: Performed;Min guard Where Assessed - Lower Body Dressing: Supported sit to Pharmacist, hospital: Performed;Min Pension scheme manager Method: Sit to Barista: Comfort height toilet Toileting - Clothing Manipulation and Hygiene: Performed;Min guard Where Assessed - Engineer, mining and Hygiene: Sit to stand from 3-in-1 or toilet Tub/Shower Transfer: Simulated;Min guard Tub/Shower Transfer Method: Science writer: Counsellor Used: Rolling walker;Gait belt Transfers/Ambulation Related to ADLs: Pt. ambulates with min guard assist.  Cues to stay inside walker and for upright posture ADL Comments: Pt able to don/doff socks EOB by crossing ankles over knees.  Pt. requires max A to don brace, but is able to instruct pt.  how to don it.  Pt. has a reacher at home, and is able to simulate toilet hygiene without beding or twisting.  HR 178 after ambulating toilet to room.  Pt. moved to seated position, and HR sustained at 160 or greater for at least 20 mins.  RN notified who reports MD aware.  Pt. instructed to check HR regularly and to call MD if it stays elevated.  He verbalized understanding    OT Diagnosis:    OT Problem List:   OT Treatment Interventions:     OT Goals ADL Goals ADL Goal: Lower Body Bathing - Progress: Progressing toward goals ADL Goal: Lower Body Dressing - Progress: Progressing toward goals ADL Goal: Toileting - Clothing Manipulation - Progress: Progressing toward goals ADL Goal: Tub/Shower Transfer - Progress: Progressing toward goals  Visit Information  Last OT Received On: 10/06/11 Assistance Needed: +2 (chair follow)    Subjective Data      Prior Functioning       Cognition  Overall Cognitive Status: Appears within functional limits for tasks assessed/performed Arousal/Alertness: Awake/alert Orientation Level: Appears intact for tasks assessed Behavior During Session: [facility] for tasks performed    Mobility Bed Mobility Bed Mobility: Rolling Left;Left Sidelying to Sit;Sitting - Scoot to Edge of Bed Rolling Left: 4: Min guard;With rail Right Sidelying to Sit: 4: Min guard;HOB elevated;With rails Sitting - Scoot to Edge of Bed: 5: Supervision Details for Bed Mobility Assistance: Pt standing with OT at presentation. Transfers Transfers: Sit to Stand;Stand to Sit Sit to Stand: 4: Min guard;With upper extremity assist;From bed Stand to Sit: 4: Min guard Details for Transfer Assistance: Pt's HR was checked as emerging from bathroom after toileting with OT and HR in 170's. Rolling chair  brought to pt and pt sat with min guard and verbal cueing for hand placement.   Exercises    Balance    End of Session OT - End of Session Equipment Utilized During Treatment: Back  brace Activity Tolerance: Other (comment) (elevated HR) Patient left: in chair;with call bell/phone within reach (with PT) Nurse Communication: Other (comment) (elevated HR)  GO     Gordan Grell M 10/06/2011, 3:22 PM

## 2011-10-06 NOTE — Progress Notes (Signed)
I have read and agree with the below treatment note and plan. Oney Tatlock Helen Whitlow PT, DPT Pager: [phone] 

## 2011-10-06 NOTE — Progress Notes (Signed)
Physical Therapy Treatment Patient Details Name: Malik Zuniga MRN: [id number] DOB: 16-May-1945 Today's Date: 10/06/2011 Time: 4098-1191 PT Time Calculation (min): 35 min  PT Assessment / Plan / Recommendation Comments on Treatment Session  Pt seen again today for stair training since pt is planning d/c this afternoon. When arrived for session, pt was working on toileting with OT. When pt emerged ambulating from bathroom, HR was near 180. Pt sat down and even during rest HR remained above 145. Approx 5 minutes after sitting, pt began to complain of "feeling warm" and HR 160. Nursing notified. Pt denies any other symptoms other than feeling warm. Pt educated about the importance of monitoring vitals (HR and BP) after discharge and pt reports having "BP machine" at home. Pt instructed to notify MD if HR remains elevated. Stairs not attempted due to elevated HR. Pt educated about using wheelchair to get up stairs into house and given handout on proper technique. PT spoke to pt's wife via phone and wife reports having used WC method previously with stairs and wife was told a handout had been left in pt's room.    Follow Up Recommendations  Home health PT; 24 hour supervision   Barriers to Discharge        Equipment Recommendations  None recommended by PT    Recommendations for Other Services    Frequency Min 5X/week   Plan Discharge plan remains appropriate;Frequency remains appropriate    Precautions / Restrictions Precautions Precautions: Back Required Braces or Orthoses: Spinal Brace Spinal Brace: Thoracolumbosacral orthotic;Applied in sitting position   Pertinent Vitals/Pain Pt's HR 180 after toileting with OT. HR remained above 145 even after seated rest and after approximately 5 minutes pt c/o feeling "warm" and HR was 160. Nursing notified.    Mobility  Bed Mobility Details for Bed Mobility Assistance: Pt standing with OT at presentation. Transfers Transfers: Stand to Sit Stand  to Sit: 4: Min guard Details for Transfer Assistance: Pt's HR was checked as emerging from bathroom after toileting with OT and HR in 170's. Rolling chair brought to pt and pt sat with min guard and verbal cueing for hand placement. Ambulation/Gait Ambulation/Gait Assistance Details: Ambulation not attempted due to HR near 180 post toileting with OT. Stairs: No      PT Goals Acute Rehab PT Goals PT Goal: Stand to Sit - Progress: Progressing toward goal PT Goal: Ambulate - Progress: Progressing toward goal  Visit Information  Last PT Received On: 10/06/11 Assistance Needed: +2 (chair follow)    Subjective Data  Subjective: "I am ready to go home."   Cognition  Overall Cognitive Status: Appears within functional limits for tasks assessed/performed Arousal/Alertness: Awake/alert Orientation Level: Appears intact for tasks assessed Behavior During Session: [facility] for tasks performed    Balance     End of Session PT - End of Session Equipment Utilized During Treatment: Gait belt;Back brace Activity Tolerance: Patient limited by fatigue;Treatment limited secondary to medical complications (Comment) (Elevated HR) Patient left: in chair;with call bell/phone within reach Nurse Communication: Other (comment) (elevated HR (160) and "feeling warm" after minutes of rest)   GP     Malik Zuniga 10/06/2011, 2:57 PM

## 2011-10-06 NOTE — Progress Notes (Signed)
I have read and agree with the below treatment note and plan. Betzalel Umbarger Helen Whitlow PT, DPT Pager: [phone] 

## 2011-10-06 NOTE — Progress Notes (Signed)
Name: Malik Zuniga MRN: [id number] DOB: Nov 30, 1945    LOS: 7 PCP- Beverely Low Neurosx : Pool  PCCM  NOTE  History of Present Illness: [name] with severe painful junctional kyphoscoliosis failing conservative management underwent Reexploration of lumbar fusion with removal of hardware from L2-S1. Open reduction of deformity & T7-S1 posterior lateral segmental arthrodesis on 8/19.  Pt seen by [facility] 8/22 for SIRS, resp distress.   Cultures: Blood 8/22 >>>>1/2 klebsiella (pan sens)  1/2 pseudomonas (pan sens)   Antibiotics: 8/22 vanc  (sepsis)>>8/24 8/22 zosyn (sepsis, ?source gram neg rods in blood)>>8/26 8/26 Cipro (per ID)>>>  Subjective/ Overnight:  Feeling much stronger.  OOB to chair.  Denies SOB.  Tachy with activity per PT.  Somewhat htn   Vital Signs: Temp:  [97.8 F (36.6 C)-98.2 F (36.8 C)] 97.8 F (36.6 C) (08/26 0619) Pulse Rate:  [93-130] 130  (08/26 0928) Resp:  [16-19] 18  (08/26 0619) BP: (126-149)/(65-101) 149/101 mmHg (08/26 0928) SpO2:  [92 %-98 %] 98 % (08/26 0928) I/O last 3 completed shifts: In: -  Out: 1850 [Urine:1850]  Physical Examination: Gen. Pleasant male, NAD OOB in chair  ENT - no lesions, no post nasal drip Neck: No JVD, no thyromegaly, no carotid bruits Lungs clearer  Cardiovascular: Rhythm regular, heart sounds  normal, no murmurs, no peripheral edema Abdomen: soft and non-tender, no hepatosplenomegaly, BS normal. Musculoskeletal: No deformities, no cyanosis or clubbing, hemovac with sanguinous drainage, no tenderness over wound, back brace  Neuro:  alert, non focal Skin:  Warm, no lesions/ rash   Labs and Imaging:  CBC    Component Value Date/Time   WBC 5.9 10/05/2011 0620   RBC 2.65* 10/05/2011 0620   HGB 8.3* 10/05/2011 0620   HCT 23.3* 10/05/2011 0620   PLT 243 10/05/2011 0620   MCV 87.9 10/05/2011 0620   MCH 31.3 10/05/2011 0620   MCHC 35.6 10/05/2011 0620   RDW 13.6 10/05/2011 0620   LYMPHSABS 2.7 05/22/2011 1421   MONOABS 0.5  05/22/2011 1421   EOSABS 0.1 05/22/2011 1421   BASOSABS 0.0 05/22/2011 1421    BMET    Component Value Date/Time   NA 135 10/05/2011 0620   K 3.7 10/05/2011 0620   CL 97 10/05/2011 0620   CO2 30 10/05/2011 0620   GLUCOSE 90 10/05/2011 0620   GLUCOSE 88 12/10/2009   BUN 5* 10/05/2011 0620   CREATININE 0.58 10/05/2011 0620   CALCIUM 9.1 10/05/2011 0620   GFRNONAA >90 10/05/2011 0620   GFRAA >90 10/05/2011 0620    pCXR - no new cxr 8/26   Assessment and Plan  Acute respiratory failure with hypoxia (10/02/2011) - resolved. Now on RA.  Underlying COPD on home O2 (DrClance- last 12/12)   Assessment: suspect d/t atelectasis and bronchitis, with bacteremia d/t gram neg rods ?source No PE   Plan Titrate oxygen BD therapy - Albut+Ipratrop NEBS Q6h, Symbicort160- 2Bid, & AlbutHFA prn Antibiotic coverage per ID - currently cipro outpt pulm f/u with dr clance  Sepsis (10/02/2011) -  Pseudomonas AND klebsiella in blood cultures.  ??Ecoli as well.  ?true polymicrobial bacteremia - source bowel vs phlebitis   PLAN -  ID following  Now on Cipro  Will f/u with ID as outpt Rechecking blood cultures per ID    Kyphoscoliosis (09/29/2011) - s/p surg   Assessment: post op    Plan: per neurosx, careful w/ pain meds  HTN/mild tachy --  Plan -  Will add back home toprol 8/26  ANEMIA -  Hg=7.8 post op nadir, was 14.5 pre-op... Lab 10/05/11 0620 10/04/11 0420 10/02/11 1830 10/01/11 1030 09/30/11 0630  HGB 8.3* 7.8* 8.8* 9.5* 9.9*     Plan: cont oral Fe, watch CBC   Hx Severe Depression - on numerous meds per DrPittman, [facility]    Plan:  Continue meds   PCCM signing off, please call back if needed. Will f/u as outpt.    Danford Bad, NP 10/06/2011  10:00 AM Pager: [phone] or [phone]  Independently examined pt, evaluated data & formulated above care plan with NP, edited note  Also d/w ID consultant  [facility].

## 2011-10-07 LAB — CULTURE, BLOOD (ROUTINE X 2)

## 2011-10-09 DIAGNOSIS — I69998 Other sequelae following unspecified cerebrovascular disease: Secondary | ICD-10-CM | POA: Diagnosis not present

## 2011-10-09 DIAGNOSIS — Z5189 Encounter for other specified aftercare: Secondary | ICD-10-CM | POA: Diagnosis not present

## 2011-10-09 DIAGNOSIS — M6281 Muscle weakness (generalized): Secondary | ICD-10-CM | POA: Diagnosis not present

## 2011-10-09 DIAGNOSIS — Z4789 Encounter for other orthopedic aftercare: Secondary | ICD-10-CM | POA: Diagnosis not present

## 2011-10-09 DIAGNOSIS — R269 Unspecified abnormalities of gait and mobility: Secondary | ICD-10-CM | POA: Diagnosis not present

## 2011-10-10 DIAGNOSIS — M6281 Muscle weakness (generalized): Secondary | ICD-10-CM | POA: Diagnosis not present

## 2011-10-10 DIAGNOSIS — Z4789 Encounter for other orthopedic aftercare: Secondary | ICD-10-CM | POA: Diagnosis not present

## 2011-10-10 DIAGNOSIS — Z5189 Encounter for other specified aftercare: Secondary | ICD-10-CM | POA: Diagnosis not present

## 2011-10-10 DIAGNOSIS — I69998 Other sequelae following unspecified cerebrovascular disease: Secondary | ICD-10-CM | POA: Diagnosis not present

## 2011-10-10 DIAGNOSIS — R269 Unspecified abnormalities of gait and mobility: Secondary | ICD-10-CM | POA: Diagnosis not present

## 2011-10-12 LAB — CULTURE, BLOOD (ROUTINE X 2): Culture: NO GROWTH

## 2011-10-14 DIAGNOSIS — R269 Unspecified abnormalities of gait and mobility: Secondary | ICD-10-CM | POA: Diagnosis not present

## 2011-10-14 DIAGNOSIS — M6281 Muscle weakness (generalized): Secondary | ICD-10-CM | POA: Diagnosis not present

## 2011-10-14 DIAGNOSIS — Z4789 Encounter for other orthopedic aftercare: Secondary | ICD-10-CM | POA: Diagnosis not present

## 2011-10-14 DIAGNOSIS — I69998 Other sequelae following unspecified cerebrovascular disease: Secondary | ICD-10-CM | POA: Diagnosis not present

## 2011-10-14 DIAGNOSIS — Z5189 Encounter for other specified aftercare: Secondary | ICD-10-CM | POA: Diagnosis not present

## 2011-10-17 DIAGNOSIS — I69998 Other sequelae following unspecified cerebrovascular disease: Secondary | ICD-10-CM | POA: Diagnosis not present

## 2011-10-17 DIAGNOSIS — M6281 Muscle weakness (generalized): Secondary | ICD-10-CM | POA: Diagnosis not present

## 2011-10-17 DIAGNOSIS — R269 Unspecified abnormalities of gait and mobility: Secondary | ICD-10-CM | POA: Diagnosis not present

## 2011-10-17 DIAGNOSIS — Z4789 Encounter for other orthopedic aftercare: Secondary | ICD-10-CM | POA: Diagnosis not present

## 2011-10-17 DIAGNOSIS — Z5189 Encounter for other specified aftercare: Secondary | ICD-10-CM | POA: Diagnosis not present

## 2011-10-21 DIAGNOSIS — Z4789 Encounter for other orthopedic aftercare: Secondary | ICD-10-CM | POA: Diagnosis not present

## 2011-10-21 DIAGNOSIS — Z5189 Encounter for other specified aftercare: Secondary | ICD-10-CM | POA: Diagnosis not present

## 2011-10-21 DIAGNOSIS — I69998 Other sequelae following unspecified cerebrovascular disease: Secondary | ICD-10-CM | POA: Diagnosis not present

## 2011-10-21 DIAGNOSIS — M6281 Muscle weakness (generalized): Secondary | ICD-10-CM | POA: Diagnosis not present

## 2011-10-21 DIAGNOSIS — R269 Unspecified abnormalities of gait and mobility: Secondary | ICD-10-CM | POA: Diagnosis not present

## 2011-10-27 ENCOUNTER — Inpatient Hospital Stay: Payer: Medicare Other | Admitting: Pulmonary Disease

## 2011-10-28 DIAGNOSIS — I69998 Other sequelae following unspecified cerebrovascular disease: Secondary | ICD-10-CM | POA: Diagnosis not present

## 2011-10-28 DIAGNOSIS — M6281 Muscle weakness (generalized): Secondary | ICD-10-CM | POA: Diagnosis not present

## 2011-10-28 DIAGNOSIS — R269 Unspecified abnormalities of gait and mobility: Secondary | ICD-10-CM | POA: Diagnosis not present

## 2011-10-28 DIAGNOSIS — Z5189 Encounter for other specified aftercare: Secondary | ICD-10-CM | POA: Diagnosis not present

## 2011-10-28 DIAGNOSIS — Z4789 Encounter for other orthopedic aftercare: Secondary | ICD-10-CM | POA: Diagnosis not present

## 2011-10-30 ENCOUNTER — Ambulatory Visit (INDEPENDENT_AMBULATORY_CARE_PROVIDER_SITE_OTHER): Payer: Medicare Other | Admitting: Pulmonary Disease

## 2011-10-30 ENCOUNTER — Encounter: Payer: Self-pay | Admitting: Pulmonary Disease

## 2011-10-30 VITALS — BP 100/62 | HR 95 | Temp 97.0°F | Ht 70.0 in | Wt 170.0 lb

## 2011-10-30 DIAGNOSIS — Z23 Encounter for immunization: Secondary | ICD-10-CM | POA: Diagnosis not present

## 2011-10-30 DIAGNOSIS — J449 Chronic obstructive pulmonary disease, unspecified: Secondary | ICD-10-CM

## 2011-10-30 NOTE — Patient Instructions (Addendum)
Stop smoking No change in your breathing medications.  Will give you a flu shot today. followup with me in 6mos.

## 2011-10-30 NOTE — Assessment & Plan Note (Signed)
The patient appears to be stable from a pulmonary standpoint.  He has not had a recent pulmonary infection or acute exacerbation, but unfortunately he continues to smoke.  I have stressed to him again the importance of total smoking cessation.  He is on a good bronchodilator regimen, and I have asked him to continue this.  Will also give him a flu shot today.

## 2011-10-30 NOTE — Progress Notes (Signed)
  Subjective:    Patient ID: Malik Zuniga, male    DOB: [birth year], 66 y.o.   MRN: [id number]  HPI The patient comes in today for followup of his known COPD.  He was recently in the hospital for back surgery, and this was complicated by Pseudomonas bacteremia of unknown origin.  He is being followed by infectious disease for this.  His chest x-ray really was not suggestive of pneumonia.  The patient has been staying on his usual inhaler regimen, but unfortunately continues to smoke.  He denies any significant cough or purulent mucus at this time, nor does he have chest congestion.  He feels that his breathing is near his usual baseline.   Review of Systems  Constitutional: Negative for fever and unexpected weight change.  HENT: Positive for trouble swallowing. Negative for ear pain, nosebleeds, congestion, sore throat, rhinorrhea, sneezing, dental problem, postnasal drip and sinus pressure.   Eyes: Negative for redness and itching.  Respiratory: Positive for shortness of breath and wheezing. Negative for cough and chest tightness.   Cardiovascular: Negative for palpitations and leg swelling.  Gastrointestinal: Negative for nausea and vomiting.  Genitourinary: Negative for dysuria.  Musculoskeletal: Positive for joint swelling.  Skin: Negative for rash.  Neurological: Positive for headaches.  Hematological: Bruises/bleeds easily.  Psychiatric/Behavioral: Positive for dysphoric mood. The patient is nervous/anxious.        Objective:   Physical Exam Well-developed male in no acute distress Nose without purulence or discharge noted Chest with mild decrease in breath sounds, no wheezing Cardiac exam with regular rate and rhythm Lower extremities with no significant edema, no cyanosis Alert and oriented, moves all 4 extremities.       Assessment & Plan:

## 2011-10-31 DIAGNOSIS — R269 Unspecified abnormalities of gait and mobility: Secondary | ICD-10-CM | POA: Diagnosis not present

## 2011-10-31 DIAGNOSIS — M6281 Muscle weakness (generalized): Secondary | ICD-10-CM | POA: Diagnosis not present

## 2011-10-31 DIAGNOSIS — Z5189 Encounter for other specified aftercare: Secondary | ICD-10-CM | POA: Diagnosis not present

## 2011-10-31 DIAGNOSIS — Z4789 Encounter for other orthopedic aftercare: Secondary | ICD-10-CM | POA: Diagnosis not present

## 2011-10-31 DIAGNOSIS — I69998 Other sequelae following unspecified cerebrovascular disease: Secondary | ICD-10-CM | POA: Diagnosis not present

## 2011-11-04 ENCOUNTER — Inpatient Hospital Stay: Payer: Medicare Other | Admitting: Infectious Disease

## 2011-11-04 DIAGNOSIS — I69998 Other sequelae following unspecified cerebrovascular disease: Secondary | ICD-10-CM | POA: Diagnosis not present

## 2011-11-04 DIAGNOSIS — Z5189 Encounter for other specified aftercare: Secondary | ICD-10-CM | POA: Diagnosis not present

## 2011-11-04 DIAGNOSIS — M6281 Muscle weakness (generalized): Secondary | ICD-10-CM | POA: Diagnosis not present

## 2011-11-04 DIAGNOSIS — R269 Unspecified abnormalities of gait and mobility: Secondary | ICD-10-CM | POA: Diagnosis not present

## 2011-11-04 DIAGNOSIS — Z4789 Encounter for other orthopedic aftercare: Secondary | ICD-10-CM | POA: Diagnosis not present

## 2011-11-05 ENCOUNTER — Inpatient Hospital Stay: Payer: Medicare Other | Admitting: Infectious Disease

## 2011-11-05 DIAGNOSIS — Z4789 Encounter for other orthopedic aftercare: Secondary | ICD-10-CM | POA: Diagnosis not present

## 2011-11-05 DIAGNOSIS — R269 Unspecified abnormalities of gait and mobility: Secondary | ICD-10-CM | POA: Diagnosis not present

## 2011-11-05 DIAGNOSIS — Z5189 Encounter for other specified aftercare: Secondary | ICD-10-CM | POA: Diagnosis not present

## 2011-11-05 DIAGNOSIS — I69998 Other sequelae following unspecified cerebrovascular disease: Secondary | ICD-10-CM | POA: Diagnosis not present

## 2011-11-05 DIAGNOSIS — M6281 Muscle weakness (generalized): Secondary | ICD-10-CM | POA: Diagnosis not present

## 2011-11-06 DIAGNOSIS — M4120 Other idiopathic scoliosis, site unspecified: Secondary | ICD-10-CM | POA: Diagnosis not present

## 2011-11-10 ENCOUNTER — Telehealth: Payer: Self-pay | Admitting: Family Medicine

## 2011-11-10 MED ORDER — FLUTICASONE PROPIONATE 50 MCG/ACT NA SUSP: 2.0000 | Freq: Every day | NASAL | Status: DC

## 2011-11-10 NOTE — Telephone Encounter (Signed)
Refill: Fluticasone propiona 50 mcg/inh nose aer #16. Use 2 sprays in each nostril daily. Last fill 9.9.13

## 2011-11-10 NOTE — Telephone Encounter (Signed)
rx sent to pharmacy by e-script  

## 2011-11-24 ENCOUNTER — Ambulatory Visit (INDEPENDENT_AMBULATORY_CARE_PROVIDER_SITE_OTHER): Payer: Medicare Other | Admitting: Infectious Disease

## 2011-11-24 ENCOUNTER — Encounter: Payer: Self-pay | Admitting: Infectious Disease

## 2011-11-24 ENCOUNTER — Telehealth: Payer: Self-pay | Admitting: Infectious Disease

## 2011-11-24 VITALS — BP 110/72 | HR 96 | Temp 97.3°F | Wt 170.0 lb

## 2011-11-24 DIAGNOSIS — A419 Sepsis, unspecified organism: Secondary | ICD-10-CM

## 2011-11-24 DIAGNOSIS — J9600 Acute respiratory failure, unspecified whether with hypoxia or hypercapnia: Secondary | ICD-10-CM | POA: Diagnosis not present

## 2011-11-24 DIAGNOSIS — A498 Other bacterial infections of unspecified site: Secondary | ICD-10-CM

## 2011-11-24 DIAGNOSIS — T8140XA Infection following a procedure, unspecified, initial encounter: Secondary | ICD-10-CM | POA: Diagnosis not present

## 2011-11-24 DIAGNOSIS — B9620 Unspecified Escherichia coli [E. coli] as the cause of diseases classified elsewhere: Secondary | ICD-10-CM | POA: Insufficient documentation

## 2011-11-24 DIAGNOSIS — R7989 Other specified abnormal findings of blood chemistry: Secondary | ICD-10-CM

## 2011-11-24 DIAGNOSIS — B965 Pseudomonas (aeruginosa) (mallei) (pseudomallei) as the cause of diseases classified elsewhere: Secondary | ICD-10-CM | POA: Diagnosis not present

## 2011-11-24 DIAGNOSIS — R7881 Bacteremia: Secondary | ICD-10-CM | POA: Diagnosis not present

## 2011-11-24 LAB — C-REACTIVE PROTEIN: CRP: 4.2 mg/dL — ABNORMAL HIGH (ref <0.60)

## 2011-11-24 LAB — CBC WITH DIFFERENTIAL/PLATELET
Basophils Absolute: 0 10*3/uL (ref 0.0–0.1)
Eosinophils Relative: 1 % (ref 0–5)
Lymphocytes Relative: 21 % (ref 12–46)
Lymphs Abs: 1.9 10*3/uL (ref 0.7–4.0)
MCV: 83.2 fL (ref 78.0–100.0)
Neutro Abs: 6.8 10*3/uL (ref 1.7–7.7)
Neutrophils Relative %: 73 % (ref 43–77)
Platelets: 769 10*3/uL — ABNORMAL HIGH (ref 150–400)
RBC: 3.98 MIL/uL — ABNORMAL LOW (ref 4.22–5.81)
RDW: 15.1 % (ref 11.5–15.5)
WBC: 9.3 10*3/uL (ref 4.0–10.5)

## 2011-11-24 LAB — BASIC METABOLIC PANEL WITH GFR
CO2: 25 mEq/L (ref 19–32)
Calcium: 9.7 mg/dL (ref 8.4–10.5)
Creat: 0.72 mg/dL (ref 0.50–1.35)
GFR, Est African American: >89 mL/min
GFR, Est Non African American: >89 mL/min
Sodium: 130 mEq/L — ABNORMAL LOW (ref 135–145)

## 2011-11-24 NOTE — Telephone Encounter (Signed)
His Platelet count is PROFOUNDLY elevated. I would like him to come back for repeat cbc in next 3 days

## 2011-11-24 NOTE — Assessment & Plan Note (Signed)
See above discussion he completed 2 weeks of antibiotic therapy we'll check surveillance cultures

## 2011-11-24 NOTE — Assessment & Plan Note (Signed)
Again see above discussion Will check surveillance cultures.

## 2011-11-24 NOTE — Assessment & Plan Note (Signed)
Working diagnoses had been a gram-negative bacteremia associated with a peripheral line. The fact that he grew separate organisms from each bottle Pseudomonas and Escherichia coli does however bring some discordance to this diagnosis. Hopefully does not have an actual hardware infection. Will check a sedimentation rate and C-reactive protein today. We'll also check repeat blood cultures for surveillance purposes

## 2011-11-24 NOTE — Progress Notes (Signed)
  Subjective:    Patient ID: Malik Zuniga, male    DOB: 11-26-1945, 66 y.o.   MRN: [id number]  HPI  66 y.o. male with Post lumbar surgery (Reexploration of lumbar fusion with removal of hardware from L2-S1) fevers and gram negative bacteremia. He actually had  TWO DIFFERENT organisms growing from blood on the 22nd (pseudmonas and E coli). He has now completed two weeks post-clearance of his bacteremia. He is without fevers, nausea or malaise. His back pain is dramatically improved since surgery.   Review of Systems  Constitutional: Negative for fever, chills, diaphoresis, activity change, appetite change, fatigue and unexpected weight change.  HENT: Negative for congestion, sore throat, rhinorrhea, sneezing, trouble swallowing and sinus pressure.   Eyes: Negative for photophobia and visual disturbance.  Respiratory: Negative for cough, chest tightness, shortness of breath, wheezing and stridor.   Cardiovascular: Negative for chest pain, palpitations and leg swelling.  Gastrointestinal: Negative for nausea, vomiting, abdominal pain, diarrhea, constipation, blood in stool, abdominal distention and anal bleeding.  Genitourinary: Negative for dysuria, hematuria, flank pain and difficulty urinating.  Musculoskeletal: Negative for myalgias, back pain, joint swelling, arthralgias and gait problem.  Skin: Negative for color change, pallor, rash and wound.  Neurological: Negative for dizziness, tremors, weakness and light-headedness.  Hematological: Negative for adenopathy. Does not bruise/bleed easily.  Psychiatric/Behavioral: Negative for behavioral problems, confusion, disturbed wake/sleep cycle, dysphoric mood, decreased concentration and agitation.       Objective:   Physical Exam  Constitutional: He is oriented to person, place, and time. He appears well-developed and well-nourished. No distress.  HENT:  Head: Normocephalic and atraumatic.  Mouth/Throat: Oropharynx is clear and moist. No  oropharyngeal exudate.  Eyes: Conjunctivae normal and EOM are normal. Pupils are equal, round, and reactive to light. No scleral icterus.  Neck: Normal range of motion. Neck supple. No JVD present.  Cardiovascular: Normal rate, regular rhythm and normal heart sounds.  Exam reveals no gallop and no friction rub.   No murmur heard. Pulmonary/Chest: Effort normal and breath sounds normal. No respiratory distress. He has no wheezes. He has no rales. He exhibits no tenderness.  Abdominal: He exhibits no distension and no mass. There is no tenderness. There is no rebound and no guarding.  Musculoskeletal: He exhibits no edema and no tenderness.       Arms: Lymphadenopathy:    He has no cervical adenopathy.  Neurological: He is alert and oriented to person, place, and time. He has normal reflexes. He exhibits normal muscle tone. Coordination normal.  Skin: Skin is warm and dry. He is not diaphoretic. No erythema. No pallor.  Psychiatric: He has a normal mood and affect. His behavior is normal. Judgment and thought content normal.          Assessment & Plan:  Postoperative infection  Working diagnoses had been a gram-negative bacteremia associated with a peripheral line. The fact that he grew separate organisms from each bottle Pseudomonas and Escherichia coli does however bring some discordance to this diagnosis. Hopefully does not have an actual hardware infection. Will check a sedimentation rate and C-reactive protein today. We'll also check repeat blood cultures for surveillance purposes  Pseudomonas infection See above discussion he completed 2 weeks of antibiotic therapy we'll check surveillance cultures  E coli bacteremia Again see above discussion Will check surveillance cultures.

## 2011-11-25 LAB — SEDIMENTATION RATE: Sed Rate: 90 mm/hr — ABNORMAL HIGH (ref 0–16)

## 2011-11-25 NOTE — Telephone Encounter (Signed)
Patient is coming in on Friday morning

## 2011-11-25 NOTE — Telephone Encounter (Signed)
Excellent

## 2011-11-28 ENCOUNTER — Other Ambulatory Visit (INDEPENDENT_AMBULATORY_CARE_PROVIDER_SITE_OTHER): Payer: Medicare Other

## 2011-11-28 DIAGNOSIS — D473 Essential (hemorrhagic) thrombocythemia: Secondary | ICD-10-CM

## 2011-11-28 DIAGNOSIS — R7989 Other specified abnormal findings of blood chemistry: Secondary | ICD-10-CM

## 2011-11-28 LAB — CBC WITH DIFFERENTIAL/PLATELET
Eosinophils Absolute: 0.1 10*3/uL (ref 0.0–0.7)
Hemoglobin: 11 g/dL — ABNORMAL LOW (ref 13.0–17.0)
Lymphs Abs: 2.2 10*3/uL (ref 0.7–4.0)
MCH: 27.6 pg (ref 26.0–34.0)
Monocytes Relative: 6 % (ref 3–12)
Neutro Abs: 7.3 10*3/uL (ref 1.7–7.7)
Neutrophils Relative %: 71 % (ref 43–77)
Platelets: 716 10*3/uL — ABNORMAL HIGH (ref 150–400)
RBC: 3.98 MIL/uL — ABNORMAL LOW (ref 4.22–5.81)
WBC: 10.2 10*3/uL (ref 4.0–10.5)

## 2011-11-30 ENCOUNTER — Telehealth: Payer: Self-pay | Admitting: Infectious Disease

## 2011-11-30 DIAGNOSIS — M4640 Discitis, unspecified, site unspecified: Secondary | ICD-10-CM

## 2011-11-30 LAB — CULTURE, BLOOD (SINGLE): Organism ID, Bacteria: NO GROWTH

## 2011-11-30 NOTE — Telephone Encounter (Signed)
Given patients VERY high ESR and high platelets I am concerned for infection in his lumbar spine as cause of his recent problems rather than the bacteremia that did not quite make sense. He had different GNR in each of his 2 blood cultures.  He NEEDS AN MRI OF HIS L SPINE.  TAMIKA CAN WE SCHEDULE MRI L SPINE FOR MR [place]?

## 2011-12-02 ENCOUNTER — Telehealth: Payer: Self-pay | Admitting: *Deleted

## 2011-12-02 NOTE — Telephone Encounter (Signed)
Thanks Denise! 

## 2011-12-02 NOTE — Telephone Encounter (Signed)
Wife wanting to know most recent lab results, platelets.  Rn reviewed lab results and Dr Zenaida Niece Dam's last telephone note.  Shared that platelets were still high and that Dr Daiva Eves wanted the pt to have a MRI w/ and w/o contrast.  MRI scheduled for Thurs, Oct 24 @ 1300 at [facility].  Notified wife of appt and wife verbalized understanding.

## 2011-12-03 DIAGNOSIS — F331 Major depressive disorder, recurrent, moderate: Secondary | ICD-10-CM | POA: Diagnosis not present

## 2011-12-04 ENCOUNTER — Ambulatory Visit (HOSPITAL_COMMUNITY)
Admission: RE | Admit: 2011-12-04 | Discharge: 2011-12-04 | Disposition: A | Discharge status: Home / Self Care | Payer: Medicare Other | Source: Ambulatory Visit | Attending: Infectious Disease | Admitting: Infectious Disease

## 2011-12-04 DIAGNOSIS — Z981 Arthrodesis status: Secondary | ICD-10-CM | POA: Insufficient documentation

## 2011-12-04 DIAGNOSIS — M4640 Discitis, unspecified, site unspecified: Secondary | ICD-10-CM

## 2011-12-04 DIAGNOSIS — M545 Low back pain, unspecified: Secondary | ICD-10-CM | POA: Diagnosis not present

## 2011-12-04 MED ORDER — GADOBENATE DIMEGLUMINE 529 MG/ML IV SOLN
17.0000 mL | Freq: Once | INTRAVENOUS | Status: AC
  Administered 2011-12-04: 17 mL via INTRAVENOUS

## 2011-12-08 ENCOUNTER — Telehealth: Payer: Self-pay | Admitting: *Deleted

## 2011-12-08 ENCOUNTER — Telehealth: Payer: Self-pay | Admitting: Infectious Disease

## 2011-12-08 DIAGNOSIS — M4640 Discitis, unspecified, site unspecified: Secondary | ICD-10-CM

## 2011-12-08 NOTE — Telephone Encounter (Signed)
Spoke with Pt via telphone tonight. He looks to have infection at operative site. Will need to get iR gudied aspiration and need to have his surgeon notified

## 2011-12-08 NOTE — Telephone Encounter (Signed)
Spoke with Malik Zuniga via telephone Monday night.  HIS MRI IS VERY CONCERNING FOR INFECTION OF HIS OPERATIVE SITE.  I WOULD LIKE TO SET UP IR GUIDED ASPIRATION OF FHIS AREA FOR CULTURE.  TAMIKA CAN WE WORK ON THIS TOMORROW AM?  I WILL ALSO CALL HIS NEURO SURGEON DR. Dutch Quint

## 2011-12-08 NOTE — Telephone Encounter (Signed)
Patient's wife calling for MRI results, done on 12/04/11, please advise Wendall Mola CMA

## 2011-12-09 ENCOUNTER — Telehealth: Payer: Self-pay | Admitting: Infectious Disease

## 2011-12-09 NOTE — Telephone Encounter (Signed)
Ok I will work on it today

## 2011-12-09 NOTE — Telephone Encounter (Signed)
Dr Dutch Quint is FINE with aspiration from Radiology

## 2011-12-09 NOTE — Telephone Encounter (Signed)
THaanks

## 2011-12-09 NOTE — Telephone Encounter (Signed)
Trying to talk to Dr Dutch Quint re pts MRI findings

## 2011-12-10 ENCOUNTER — Other Ambulatory Visit: Payer: Self-pay | Admitting: Infectious Disease

## 2011-12-10 ENCOUNTER — Encounter (HOSPITAL_COMMUNITY): Payer: Self-pay | Admitting: Pharmacy Technician

## 2011-12-10 ENCOUNTER — Other Ambulatory Visit: Payer: Self-pay | Admitting: *Deleted

## 2011-12-10 ENCOUNTER — Other Ambulatory Visit: Payer: Self-pay | Admitting: Radiology

## 2011-12-10 DIAGNOSIS — L02212 Cutaneous abscess of back [any part, except buttock]: Secondary | ICD-10-CM

## 2011-12-10 DIAGNOSIS — M4640 Discitis, unspecified, site unspecified: Secondary | ICD-10-CM

## 2011-12-11 ENCOUNTER — Encounter (HOSPITAL_COMMUNITY): Payer: Self-pay

## 2011-12-11 ENCOUNTER — Ambulatory Visit (HOSPITAL_COMMUNITY)
Admission: RE | Admit: 2011-12-11 | Discharge: 2011-12-11 | Disposition: A | Discharge status: Home / Self Care | Payer: Medicare Other | Source: Ambulatory Visit | Attending: Infectious Disease | Admitting: Infectious Disease

## 2011-12-11 DIAGNOSIS — IMO0002 Reserved for concepts with insufficient information to code with codable children: Secondary | ICD-10-CM | POA: Diagnosis not present

## 2011-12-11 DIAGNOSIS — M4120 Other idiopathic scoliosis, site unspecified: Secondary | ICD-10-CM | POA: Diagnosis not present

## 2011-12-11 DIAGNOSIS — M4640 Discitis, unspecified, site unspecified: Secondary | ICD-10-CM

## 2011-12-11 DIAGNOSIS — L02219 Cutaneous abscess of trunk, unspecified: Secondary | ICD-10-CM | POA: Insufficient documentation

## 2011-12-11 DIAGNOSIS — M519 Unspecified thoracic, thoracolumbar and lumbosacral intervertebral disc disorder: Secondary | ICD-10-CM | POA: Diagnosis not present

## 2011-12-11 DIAGNOSIS — L02212 Cutaneous abscess of back [any part, except buttock]: Secondary | ICD-10-CM

## 2011-12-11 LAB — CBC
MCH: 27.3 pg (ref 26.0–34.0)
MCV: 80.4 fL (ref 78.0–100.0)
Platelets: 411 10*3/uL — ABNORMAL HIGH (ref 150–400)
RDW: 14.7 % (ref 11.5–15.5)

## 2011-12-11 MED ORDER — MIDAZOLAM HCL 2 MG/2ML IJ SOLN
INTRAMUSCULAR | Status: AC | PRN
  Administered 2011-12-11: 1 mg via INTRAVENOUS

## 2011-12-11 MED ORDER — FENTANYL CITRATE 0.05 MG/ML IJ SOLN
INTRAMUSCULAR | Status: AC
  Filled 2011-12-11: qty 4

## 2011-12-11 MED ORDER — SODIUM CHLORIDE 0.9 % IV SOLN: Freq: Once | INTRAVENOUS | Status: DC

## 2011-12-11 MED ORDER — FENTANYL CITRATE 0.05 MG/ML IJ SOLN
INTRAMUSCULAR | Status: AC | PRN
  Administered 2011-12-11: 25 ug via INTRAVENOUS

## 2011-12-11 MED ORDER — MIDAZOLAM HCL 2 MG/2ML IJ SOLN
INTRAMUSCULAR | Status: AC
  Filled 2011-12-11: qty 4

## 2011-12-11 NOTE — H&P (Signed)
agree

## 2011-12-11 NOTE — H&P (Signed)
Malik Zuniga is an 66 y.o. male.   Chief Complaint: Thoraco lumbar fusion 8/13 Back pain x 1 month MRI shows subcutaneous fluid collection at surgery site Scheduled for aspiration of same HPI: CVA; CAD/MI; HLD; COPD; Prostate ca; HTN; neuromuscular disorder - tremor  Past Medical History  Diagnosis Date  . Myocardial infarction   . Hyperlipidemia   . Depression   . Emphysema of lung   . Tremor   . Headache, chronic daily   . History of prostate cancer   . Cancer 2004    prostate  . On home O2   . Aspiration pneumonia   . Stroke     "mini strokes"; denies residual  . Emphysema   . Neuromuscular disorder 1998    right carpal tunnel release  . Hypertension     [employer]  . Anemia associated with acute blood loss     Past Surgical History  Procedure Date  . Cholecystectomy 1996  . 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 Jordan Likes  . Vascular stent 2000    Dr. Alanda Amass  . Hand surgery 1989    crushed thumb, Dr. Merlyn Lot  . Ulnar nerve repair 1998    left arm, Dr. Merlyn Lot  . Gastrostomy w/ feeding tube 2008    Dr. Ewing Schlein  . 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 Prostatectomy 04/2001    removal of prostate cancer, Dr. Brunilda Payor  . Coronary angioplasty with stent placement 04/1998  . Carpal tunnel release 1998    right  . Back surgery 08/2004; 02/2005; 04/2006; 06/2007; 7/20101    all by Dr. Jordan Likes    Family History  Problem Relation Age of Onset  . Heart disease Mother   . Cancer Father     brain cancer and prostate cancer    Social History:  reports that he has been smoking Cigarettes.  He has a 25 pack-year smoking history. He has never used smokeless tobacco. He reports that he does not drink alcohol or use illicit drugs.  Allergies:  Allergies  Allergen Reactions  . Lisinopril Swelling    ANGIOEDEMA  . Lamictal (Lamotrigine) Other (See Comments)    Weakness/difficulty swallowing     (Not in a hospital admission)  No results found for this or any previous visit  (from the past 48 hour(s)). No results found.  Review of Systems  Constitutional: Negative for fever.  Respiratory: Negative for wheezing.   Musculoskeletal: Positive for back pain.  Neurological: Positive for weakness.    Blood pressure 111/75, pulse 98, temperature 97.1 F (36.2 C), temperature source Oral, resp. rate 18, SpO2 95.00%. Physical Exam  Constitutional: He is oriented to person, place, and time.  Cardiovascular: Normal rate and regular rhythm.   No murmur heard. Respiratory: Effort normal and breath sounds normal. He has no wheezes.  GI: Soft. Bowel sounds are normal. There is no tenderness.  Musculoskeletal: Normal range of motion.  Neurological: He is alert and oriented to person, place, and time.       tremor  Psychiatric: He has a normal mood and affect. His behavior is normal. Judgment and thought content normal.     Assessment/Plan T-L fusion 8/13 New back pain x 1 month MRI reveals prob abscess Scheduled for aspiration now Pt aware of procedure benefits and risks and agreeable to proceed. Consent signed and in chart  Sedalia Greeson A 12/11/2011, 1:20 PM

## 2011-12-11 NOTE — Procedures (Signed)
Findings:  Ultrasound guided aspiration of paraspinal fluid Under US guidance, 18 G spinal needle used to aspirate 10 mL of blood-tinged, clear fluid from left paraspinal region in thoracolumbar region.  Fluid sent for culture.

## 2011-12-15 ENCOUNTER — Other Ambulatory Visit: Payer: Self-pay

## 2011-12-15 LAB — BODY FLUID CULTURE: Gram Stain: NONE SEEN

## 2011-12-15 MED ORDER — BUDESONIDE-FORMOTEROL FUMARATE 160-4.5 MCG/ACT IN AERO: 2.0000 | INHALATION_SPRAY | Freq: Two times a day (BID) | RESPIRATORY_TRACT | Status: DC

## 2011-12-15 MED ORDER — TIOTROPIUM BROMIDE MONOHYDRATE 18 MCG IN CAPS: 18.0000 ug | ORAL_CAPSULE | Freq: Every day | RESPIRATORY_TRACT | Status: DC

## 2011-12-15 NOTE — Telephone Encounter (Signed)
Pt wife requesting Spiriva Handihaler Last filled 05/05/11 #30 x 6 and Symbicort last filled 11/20/10 1 x 3 samples for pt. OV 11/24/11 Both on pt med list. PLz advise    MW

## 2011-12-15 NOTE — Telephone Encounter (Signed)
Called and spoke to pt wife to advise samples upfront ready for pick up.     MW

## 2011-12-16 ENCOUNTER — Telehealth: Payer: Self-pay | Admitting: *Deleted

## 2011-12-16 LAB — ANAEROBIC CULTURE

## 2011-12-16 NOTE — Telephone Encounter (Signed)
Wife/husband wanting to know lab results of last week's seroma aspiration.  2/4 results complete and negative.  Other 2 pending.  Pt had follow up appt w/ Dr. Dutch Quint on 03/13/11 and another scheduled for 01/15/12.  Pt/wife requesting a phone call from Dr. Daiva Eves as soon as possible.  [phone].  Pt is not currently scheduled for a f/u OV w/ Dr. Daiva Eves.

## 2011-12-17 DIAGNOSIS — R1310 Dysphagia, unspecified: Secondary | ICD-10-CM | POA: Diagnosis not present

## 2011-12-17 DIAGNOSIS — K219 Gastro-esophageal reflux disease without esophagitis: Secondary | ICD-10-CM | POA: Diagnosis not present

## 2011-12-17 DIAGNOSIS — K5901 Slow transit constipation: Secondary | ICD-10-CM | POA: Diagnosis not present

## 2011-12-17 NOTE — Telephone Encounter (Signed)
I reviewed his labs. He still has a fair amount of back pain. I would like to make sure he has FU appt with me in January at the latest

## 2011-12-22 NOTE — Telephone Encounter (Signed)
Scheduled w/ you for Wed., Jan. 8 @ 8:45AM.    Angelique Blonder, RN

## 2011-12-23 DIAGNOSIS — E785 Hyperlipidemia, unspecified: Secondary | ICD-10-CM | POA: Diagnosis not present

## 2011-12-23 DIAGNOSIS — I2510 Atherosclerotic heart disease of native coronary artery without angina pectoris: Secondary | ICD-10-CM | POA: Diagnosis not present

## 2011-12-23 DIAGNOSIS — I5042 Chronic combined systolic (congestive) and diastolic (congestive) heart failure: Secondary | ICD-10-CM | POA: Diagnosis not present

## 2012-01-09 LAB — FUNGUS CULTURE W SMEAR: Fungal Smear: NONE SEEN

## 2012-01-15 DIAGNOSIS — M4120 Other idiopathic scoliosis, site unspecified: Secondary | ICD-10-CM | POA: Diagnosis not present

## 2012-01-22 ENCOUNTER — Telehealth: Payer: Self-pay | Admitting: *Deleted

## 2012-01-22 NOTE — Telephone Encounter (Signed)
Samples for Symbacort and Spiriva given to pt. SGJ, RN

## 2012-01-23 ENCOUNTER — Telehealth: Payer: Self-pay | Admitting: Family Medicine

## 2012-01-23 LAB — AFB CULTURE WITH SMEAR (NOT AT ARMC): Acid Fast Smear: NONE SEEN

## 2012-01-23 MED ORDER — ALBUTEROL SULFATE HFA 108 (90 BASE) MCG/ACT IN AERS: 2.0000 | INHALATION_SPRAY | Freq: Four times a day (QID) | RESPIRATORY_TRACT | Status: DC | PRN

## 2012-01-23 NOTE — Telephone Encounter (Signed)
Request for rx refill sent.//AB/CMA

## 2012-01-23 NOTE — Telephone Encounter (Signed)
Refill: Ventolin hfa 90 mcg/inh aer gm #18. Inhale 2 puffs in th lungs four times daily. Last fill 03-17-11

## 2012-01-26 ENCOUNTER — Telehealth: Payer: Self-pay | Admitting: Family Medicine

## 2012-01-26 ENCOUNTER — Telehealth: Payer: Self-pay | Admitting: Pulmonary Disease

## 2012-01-26 DIAGNOSIS — J449 Chronic obstructive pulmonary disease, unspecified: Secondary | ICD-10-CM

## 2012-01-26 NOTE — Telephone Encounter (Signed)
Refill: Ondansetron odt 8 mg. Dissolve 1 tablet by mouth every 8 hours as needed for nausea. Qty 20. Last fill 10-24-11

## 2012-01-26 NOTE — Telephone Encounter (Signed)
Refill: Primidone 250 mg tab. Take 1 tablet by mouth three times daily. Qty 90. Last fill 12-08-11

## 2012-01-26 NOTE — Telephone Encounter (Signed)
Spoke with pt's spouse She states that the pt is no longer using o2 at hs He feels that he dose not need it anymore Wants to have the concentrator picked up Please advise if okay to send order to d/c thanks

## 2012-01-26 NOTE — Telephone Encounter (Signed)
Last OV 07-31-11

## 2012-01-26 NOTE — Telephone Encounter (Signed)
Pt's spouse aware KC will order an ONO to check O2 level while sleeping first before he will D/C the O2. Spouse verbalized understanding.

## 2012-01-26 NOTE — Telephone Encounter (Signed)
Rather than just stopping it, why don't we see if his oxygen level at night while sleeping is adequate first. Order ONO on room air for one night with download to me.

## 2012-01-26 NOTE — Telephone Encounter (Signed)
i am not filling the Primadone- no refills.  Needs to contact neuro. Ok for #30 zofran, no refills

## 2012-01-27 ENCOUNTER — Other Ambulatory Visit: Payer: Self-pay | Admitting: *Deleted

## 2012-01-27 MED ORDER — PRIMIDONE 250 MG PO TABS: 250.0000 mg | ORAL_TABLET | Freq: Two times a day (BID) | ORAL | Status: DC

## 2012-01-27 MED ORDER — ONDANSETRON HCL 8 MG PO TABS: 8.0000 mg | ORAL_TABLET | Freq: Three times a day (TID) | ORAL | Status: DC | PRN

## 2012-01-27 NOTE — Telephone Encounter (Signed)
Rx sent, Discuss with patient wife

## 2012-01-27 NOTE — Telephone Encounter (Signed)
Pt wife called back stating that Pt did see the neuro that one time we referred him. Pt is not currently seeing neuro because he was advise that he did not need to continue to see them due  To them being unable to help him with his condition. Per Pt wife he was advise that he would just have to live with condition. Pt wife would like for you to reconsider refill med for Pt. .Please advise

## 2012-01-27 NOTE — Telephone Encounter (Signed)
Ok for #60, 6 refills on primadone

## 2012-01-27 NOTE — Telephone Encounter (Signed)
Rx sent, Discuss with patient wife 

## 2012-01-30 ENCOUNTER — Telehealth: Payer: Self-pay | Admitting: Pulmonary Disease

## 2012-01-30 NOTE — Telephone Encounter (Signed)
I spoke with Sharone. She states pt is leaving for beach today. He will be gone until first of the year. They will call pt once he returns to schedule ono. Will forward to [facility] as an [place]

## 2012-01-30 NOTE — Telephone Encounter (Signed)
Noted  

## 2012-02-10 DIAGNOSIS — F331 Major depressive disorder, recurrent, moderate: Secondary | ICD-10-CM | POA: Diagnosis not present

## 2012-02-18 ENCOUNTER — Ambulatory Visit: Payer: Medicare Other | Admitting: Infectious Disease

## 2012-03-17 DIAGNOSIS — M4120 Other idiopathic scoliosis, site unspecified: Secondary | ICD-10-CM | POA: Diagnosis not present

## 2012-03-24 ENCOUNTER — Other Ambulatory Visit: Payer: Self-pay | Admitting: *Deleted

## 2012-03-24 MED ORDER — FLUTICASONE PROPIONATE 50 MCG/ACT NA SUSP: 2.0000 | Freq: Every day | NASAL | Status: DC

## 2012-03-24 NOTE — Telephone Encounter (Signed)
Discuss with patient, Rx sent. 

## 2012-04-22 ENCOUNTER — Encounter: Payer: Self-pay | Admitting: Family Medicine

## 2012-04-22 ENCOUNTER — Ambulatory Visit (INDEPENDENT_AMBULATORY_CARE_PROVIDER_SITE_OTHER): Payer: Medicare Other | Admitting: Family Medicine

## 2012-04-22 VITALS — BP 120/80 | HR 102 | Temp 97.6°F | Wt 160.4 lb

## 2012-04-22 DIAGNOSIS — C61 Malignant neoplasm of prostate: Secondary | ICD-10-CM | POA: Diagnosis not present

## 2012-04-22 DIAGNOSIS — F319 Bipolar disorder, unspecified: Secondary | ICD-10-CM | POA: Diagnosis not present

## 2012-04-22 DIAGNOSIS — I10 Essential (primary) hypertension: Secondary | ICD-10-CM

## 2012-04-22 DIAGNOSIS — Z0000 Encounter for general adult medical examination without abnormal findings: Secondary | ICD-10-CM | POA: Diagnosis not present

## 2012-04-22 DIAGNOSIS — E785 Hyperlipidemia, unspecified: Secondary | ICD-10-CM

## 2012-04-22 LAB — CBC WITH DIFFERENTIAL/PLATELET
Hemoglobin: 12.5 g/dL — ABNORMAL LOW (ref 13.0–17.0)
MCHC: 33.2 g/dL (ref 30.0–36.0)
Platelets: 415 10*3/uL — ABNORMAL HIGH (ref 150.0–400.0)
RDW: 18.1 % — ABNORMAL HIGH (ref 11.5–14.6)

## 2012-04-22 LAB — LIPID PANEL
Cholesterol: 234 mg/dL — ABNORMAL HIGH (ref 0–200)
Total CHOL/HDL Ratio: 3
Triglycerides: 93 mg/dL (ref 0.0–149.0)
VLDL: 18.6 mg/dL (ref 0.0–40.0)

## 2012-04-22 LAB — BASIC METABOLIC PANEL
Calcium: 9.8 mg/dL (ref 8.4–10.5)
Chloride: 95 mEq/L — ABNORMAL LOW (ref 96–112)
Creatinine, Ser: 0.8 mg/dL (ref 0.4–1.5)
Sodium: 131 mEq/L — ABNORMAL LOW (ref 135–145)

## 2012-04-22 LAB — HEPATIC FUNCTION PANEL
Bilirubin, Direct: 0.1 mg/dL (ref 0.0–0.3)
Total Bilirubin: 0.4 mg/dL (ref 0.3–1.2)

## 2012-04-22 MED ORDER — METOPROLOL SUCCINATE ER 100 MG PO TB24: 100.0000 mg | ORAL_TABLET | Freq: Every day | ORAL | Status: DC

## 2012-04-22 MED ORDER — SIMVASTATIN 40 MG PO TABS: 40.0000 mg | ORAL_TABLET | Freq: Every evening | ORAL | Status: DC

## 2012-04-22 NOTE — Assessment & Plan Note (Signed)
Chronic problem.  Tolerating statin w/out difficulty.  Check labs.  Adjust meds prn  

## 2012-04-22 NOTE — Progress Notes (Signed)
  Subjective:    Patient ID: Malik Zuniga, male    DOB: [birth year], 67 y.o.   MRN: [id number]  HPI Here today for CPE.  Risk Factors: HTN- chronic problem, on Hyzaar and Metoprolol.  No CP, SOB above baseline.  + HAs- improves w/ excedrin migraine.  No visual changes, edema. Hyperlipidemia- chronic problem, on Simvastatin.  No abd pain, N/V, myalgias. Bipolar- currently seeing Dr Donell Beers.  On Abilify, Depakote, Cymbalta.  Stopped Paxil.  Plan is to start Restoril and Nuvigil.  Wants EKG copy and Depakote level Physical Activity: limited due to recent back surgery Fall Risk: increased risk due to cane use Depression: ongoing problem, seeing psych Hearing: normal to conversational tones and whispered voice at 6 ft ADL's: independent Cognitive: linear thought process, memory and attention intact Home Safety: safe at home, lives w/ wife Height, Weight, BMI, Visual Acuity: see vitals, vision corrected to 20/20 w/ glasses Counseling:  Colonoscopy 2012 w/ Dr Ewing Schlein, seeing urology regularly Labs Ordered: See A&P Care Plan: See A&P    Review of Systems Patient reports no vision/hearing changes, anorexia, fever ,adenopathy, persistant/recurrent hoarseness, chest pain, palpitations, edema, persistant/recurrent cough, hemoptysis, gastrointestinal  bleeding (melena, rectal bleeding), abdominal pain, excessive heart burn, GU symptoms (dysuria, hematuria, voiding/incontinence issues) syncope, memory loss, numbness & tingling, skin/hair/nail changes, abnormal bruising/bleeding, musculoskeletal symptoms/signs.   + dysphagia- GI aware    Objective:   Physical Exam BP 120/80  Pulse 102  Temp(Src) 97.6 F (36.4 C) (Oral)  Wt 160 lb 6.4 oz (72.757 kg)  BMI 22.38 kg/m2  SpO2 97%  General Appearance:    Alert, cooperative, no distress, appears stated age  Head:    Normocephalic, without obvious abnormality, atraumatic  Eyes:    PERRL, conjunctiva/corneas clear, EOM's intact, fundi    benign, both  eyes       Ears:    Normal TM's and external ear canals, both ears  Nose:   Nares normal, septum midline, mucosa normal, no drainage   or sinus tenderness  Throat:   Lips, mucosa, and tongue normal; teeth and gums normal  Neck:   Supple, symmetrical, trachea midline, no adenopathy;       thyroid:  No enlargement/tenderness/nodules  Back:     + kyphosis, no CVA tenderness  Lungs:     Clear to auscultation bilaterally, respirations unlabored  Chest wall:    No tenderness or deformity  Heart:    Regular rate and rhythm, S1 and S2 normal, no murmur, rub   or gallop  Abdomen:     Soft, non-tender, bowel sounds active all four quadrants,    no masses, no organomegaly  Genitalia:    Deferred to urology  Rectal:    Extremities:   Extremities normal, atraumatic, no cyanosis or edema  Pulses:   2+ and symmetric all extremities  Skin:   Skin color, texture, turgor normal, no rashes or lesions  Lymph nodes:   Cervical, supraclavicular, and axillary nodes normal  Neurologic:   CNII-XII intact. + tremor.          Assessment & Plan:

## 2012-04-22 NOTE — Patient Instructions (Addendum)
Follow up in 6 months to recheck BP and cholesterol We'll notify you of your lab results and make any changes if needed Call with any questions or concerns Hang in there!

## 2012-04-22 NOTE — Assessment & Plan Note (Signed)
Chronic problem.  Well controlled.  Asymptomatic.  No anticipated changes pending lab results

## 2012-04-22 NOTE — Assessment & Plan Note (Signed)
Check PSA and fax to uro at pt's request

## 2012-04-22 NOTE — Assessment & Plan Note (Signed)
Pt's PE WNL w/ exception of known abnormalities- tremor, kyphosis.  UTD on colonoscopy, urology.  Check labs.  Anticipatory guidance provided.

## 2012-04-22 NOTE — Assessment & Plan Note (Signed)
Chronic problem.  Pt now following w/ Dr Donell Beers (previous psych retired).  Plan is to start a med for sleep and one to keep him awake during the day.  Will fax EKG to Dr Donell Beers prior to starting Nuvigil.  Depakote level ordered.  Will follow along and assist as able.

## 2012-04-28 ENCOUNTER — Ambulatory Visit: Payer: Medicare Other | Admitting: Pulmonary Disease

## 2012-05-04 ENCOUNTER — Other Ambulatory Visit: Payer: Self-pay | Admitting: *Deleted

## 2012-05-05 ENCOUNTER — Telehealth: Payer: Self-pay

## 2012-05-05 NOTE — Telephone Encounter (Signed)
No refills at this time- this is a controlled substance.  Needs OV prior to refill to know what we're treating.

## 2012-05-05 NOTE — Telephone Encounter (Signed)
Discussed with patient and he voiced understanding, he said he just need one to get to an apt, I made him aware we could not fill at this time, he can try delsym or robitussin and call when he is ready for an apt.      KP

## 2012-05-05 NOTE — Telephone Encounter (Signed)
Msg from patient requesting a refill on Tussionex.  Last seen 04/22/12 and filled 12/30/10. Please advise     KP

## 2012-05-06 ENCOUNTER — Encounter: Payer: Self-pay | Admitting: Family Medicine

## 2012-05-06 ENCOUNTER — Ambulatory Visit (INDEPENDENT_AMBULATORY_CARE_PROVIDER_SITE_OTHER): Payer: Medicare Other | Admitting: Family Medicine

## 2012-05-06 VITALS — BP 128/90 | HR 108 | Temp 97.4°F | Ht 68.25 in | Wt 162.0 lb

## 2012-05-06 DIAGNOSIS — J302 Other seasonal allergic rhinitis: Secondary | ICD-10-CM

## 2012-05-06 DIAGNOSIS — J309 Allergic rhinitis, unspecified: Secondary | ICD-10-CM | POA: Diagnosis not present

## 2012-05-06 DIAGNOSIS — J45909 Unspecified asthma, uncomplicated: Secondary | ICD-10-CM | POA: Diagnosis not present

## 2012-05-06 MED ORDER — GUAIFENESIN-CODEINE 100-10 MG/5ML PO SYRP: 10.0000 mL | ORAL_SOLUTION | Freq: Four times a day (QID) | ORAL | Status: DC | PRN

## 2012-05-06 MED ORDER — MONTELUKAST SODIUM 10 MG PO TABS: 10.0000 mg | ORAL_TABLET | Freq: Every day | ORAL | Status: DC

## 2012-05-06 NOTE — Assessment & Plan Note (Signed)
Deteriorated.  Pt declined allergy testing to see if he was allergic to pets.  On nasal steroid and antihistamine.  Start singulair daily.  Cough syrup prn.  Reviewed supportive care and red flags that should prompt return.  Pt expressed understanding and is in agreement w/ plan.

## 2012-05-06 NOTE — Progress Notes (Signed)
  Subjective:    Patient ID: Malik Zuniga, male    DOB: Dec 23, 1945, 67 y.o.   MRN: [id number]  HPI 'i think i'm allergic to my dog'- no breathing problems while in the hospital or here in office.  Will have nasal congestion and cough when around his animals (2 dogs and 1 cat).  Using OTC Zyrtec and Flonase.  Not currently on Singulair.  No current fevers, reports otherwise feeling well.  No increased WOB or SOB.   Review of Systems For ROS see HPI     Objective:   Physical Exam  Vitals reviewed. Constitutional: He appears well-developed and well-nourished. No distress.  HENT:  Head: Normocephalic and atraumatic.  No TTP over sinuses + turbinate edema + PND TMs normal bilaterally  Eyes: Conjunctivae and EOM are normal. Pupils are equal, round, and reactive to light.  Neck: Normal range of motion. Neck supple.  Cardiovascular: Normal rate, regular rhythm and normal heart sounds.   Pulmonary/Chest: Effort normal. No respiratory distress. He has no wheezes.  Coarse BS diffusely  Lymphadenopathy:    He has no cervical adenopathy.  Skin: Skin is warm and dry.          Assessment & Plan:

## 2012-05-06 NOTE — Patient Instructions (Signed)
Start the Singulair daily to better control allergies and asthma Continue the Flonase and Zyrtec daily Use the cough syrup only as needed Call with any questions or concerns Hang in there!

## 2012-05-06 NOTE — Assessment & Plan Note (Signed)
Chronic problem.  No current wheezing.  Add Singulair.

## 2012-05-27 ENCOUNTER — Other Ambulatory Visit: Payer: Self-pay | Admitting: *Deleted

## 2012-05-27 MED ORDER — NITROGLYCERIN 0.4 MG SL SUBL: 0.4000 mg | SUBLINGUAL_TABLET | SUBLINGUAL | Status: DC | PRN

## 2012-05-27 NOTE — Telephone Encounter (Signed)
Rx sent 

## 2012-06-10 DIAGNOSIS — F331 Major depressive disorder, recurrent, moderate: Secondary | ICD-10-CM | POA: Diagnosis not present

## 2012-06-16 DIAGNOSIS — M4120 Other idiopathic scoliosis, site unspecified: Secondary | ICD-10-CM | POA: Diagnosis not present

## 2012-06-23 DIAGNOSIS — I2510 Atherosclerotic heart disease of native coronary artery without angina pectoris: Secondary | ICD-10-CM | POA: Diagnosis not present

## 2012-06-23 DIAGNOSIS — E785 Hyperlipidemia, unspecified: Secondary | ICD-10-CM | POA: Diagnosis not present

## 2012-06-23 DIAGNOSIS — F17200 Nicotine dependence, unspecified, uncomplicated: Secondary | ICD-10-CM | POA: Diagnosis not present

## 2012-06-23 DIAGNOSIS — J449 Chronic obstructive pulmonary disease, unspecified: Secondary | ICD-10-CM | POA: Diagnosis not present

## 2012-06-29 DIAGNOSIS — M545 Low back pain: Secondary | ICD-10-CM | POA: Diagnosis not present

## 2012-06-29 DIAGNOSIS — M546 Pain in thoracic spine: Secondary | ICD-10-CM | POA: Diagnosis not present

## 2012-07-01 ENCOUNTER — Encounter (HOSPITAL_COMMUNITY): Payer: Self-pay | Admitting: *Deleted

## 2012-07-01 DIAGNOSIS — F17200 Nicotine dependence, unspecified, uncomplicated: Secondary | ICD-10-CM | POA: Insufficient documentation

## 2012-07-01 DIAGNOSIS — K5900 Constipation, unspecified: Secondary | ICD-10-CM | POA: Diagnosis not present

## 2012-07-01 DIAGNOSIS — R109 Unspecified abdominal pain: Secondary | ICD-10-CM | POA: Insufficient documentation

## 2012-07-01 NOTE — ED Notes (Addendum)
Rt. Flank pain. No urinary problems. Constipation x 1 week ago. Taking much pain medications for chronic pain.

## 2012-07-02 ENCOUNTER — Emergency Department (HOSPITAL_COMMUNITY): Payer: Medicare Other

## 2012-07-02 ENCOUNTER — Encounter (HOSPITAL_COMMUNITY): Payer: Self-pay | Admitting: Emergency Medicine

## 2012-07-02 ENCOUNTER — Emergency Department (HOSPITAL_COMMUNITY)
Admission: EM | Admit: 2012-07-02 | Discharge: 2012-07-03 | Disposition: A | Discharge status: Home / Self Care | Payer: Medicare Other | Attending: Emergency Medicine | Admitting: Emergency Medicine

## 2012-07-02 ENCOUNTER — Emergency Department (HOSPITAL_COMMUNITY)
Admission: EM | Admit: 2012-07-02 | Discharge: 2012-07-02 | Discharge status: Against Medical Advice | Payer: Medicare Other | Attending: Emergency Medicine | Admitting: Emergency Medicine

## 2012-07-02 DIAGNOSIS — F17200 Nicotine dependence, unspecified, uncomplicated: Secondary | ICD-10-CM | POA: Diagnosis not present

## 2012-07-02 DIAGNOSIS — F329 Major depressive disorder, single episode, unspecified: Secondary | ICD-10-CM | POA: Diagnosis not present

## 2012-07-02 DIAGNOSIS — E871 Hypo-osmolality and hyponatremia: Secondary | ICD-10-CM | POA: Insufficient documentation

## 2012-07-02 DIAGNOSIS — Z8701 Personal history of pneumonia (recurrent): Secondary | ICD-10-CM | POA: Diagnosis not present

## 2012-07-02 DIAGNOSIS — Z9981 Dependence on supplemental oxygen: Secondary | ICD-10-CM | POA: Diagnosis not present

## 2012-07-02 DIAGNOSIS — Z8546 Personal history of malignant neoplasm of prostate: Secondary | ICD-10-CM | POA: Insufficient documentation

## 2012-07-02 DIAGNOSIS — E785 Hyperlipidemia, unspecified: Secondary | ICD-10-CM | POA: Insufficient documentation

## 2012-07-02 DIAGNOSIS — D62 Acute posthemorrhagic anemia: Secondary | ICD-10-CM | POA: Diagnosis not present

## 2012-07-02 DIAGNOSIS — R110 Nausea: Secondary | ICD-10-CM | POA: Insufficient documentation

## 2012-07-02 DIAGNOSIS — Z7982 Long term (current) use of aspirin: Secondary | ICD-10-CM | POA: Insufficient documentation

## 2012-07-02 DIAGNOSIS — M549 Dorsalgia, unspecified: Secondary | ICD-10-CM

## 2012-07-02 DIAGNOSIS — G8929 Other chronic pain: Secondary | ICD-10-CM | POA: Diagnosis not present

## 2012-07-02 DIAGNOSIS — Z8679 Personal history of other diseases of the circulatory system: Secondary | ICD-10-CM | POA: Diagnosis not present

## 2012-07-02 DIAGNOSIS — Z9861 Coronary angioplasty status: Secondary | ICD-10-CM | POA: Diagnosis not present

## 2012-07-02 DIAGNOSIS — M546 Pain in thoracic spine: Secondary | ICD-10-CM | POA: Insufficient documentation

## 2012-07-02 DIAGNOSIS — R10819 Abdominal tenderness, unspecified site: Secondary | ICD-10-CM | POA: Diagnosis not present

## 2012-07-02 DIAGNOSIS — I252 Old myocardial infarction: Secondary | ICD-10-CM | POA: Diagnosis not present

## 2012-07-02 DIAGNOSIS — I10 Essential (primary) hypertension: Secondary | ICD-10-CM | POA: Insufficient documentation

## 2012-07-02 DIAGNOSIS — J4489 Other specified chronic obstructive pulmonary disease: Secondary | ICD-10-CM | POA: Insufficient documentation

## 2012-07-02 DIAGNOSIS — Z9089 Acquired absence of other organs: Secondary | ICD-10-CM | POA: Insufficient documentation

## 2012-07-02 DIAGNOSIS — Z79899 Other long term (current) drug therapy: Secondary | ICD-10-CM | POA: Insufficient documentation

## 2012-07-02 DIAGNOSIS — Z8669 Personal history of other diseases of the nervous system and sense organs: Secondary | ICD-10-CM | POA: Insufficient documentation

## 2012-07-02 DIAGNOSIS — F3289 Other specified depressive episodes: Secondary | ICD-10-CM | POA: Insufficient documentation

## 2012-07-02 DIAGNOSIS — Z8673 Personal history of transient ischemic attack (TIA), and cerebral infarction without residual deficits: Secondary | ICD-10-CM | POA: Diagnosis not present

## 2012-07-02 DIAGNOSIS — R079 Chest pain, unspecified: Secondary | ICD-10-CM | POA: Diagnosis not present

## 2012-07-02 DIAGNOSIS — J449 Chronic obstructive pulmonary disease, unspecified: Secondary | ICD-10-CM | POA: Insufficient documentation

## 2012-07-02 DIAGNOSIS — K2970 Gastritis, unspecified, without bleeding: Secondary | ICD-10-CM | POA: Diagnosis not present

## 2012-07-02 DIAGNOSIS — Z9889 Other specified postprocedural states: Secondary | ICD-10-CM | POA: Diagnosis not present

## 2012-07-02 DIAGNOSIS — I2510 Atherosclerotic heart disease of native coronary artery without angina pectoris: Secondary | ICD-10-CM | POA: Diagnosis not present

## 2012-07-02 LAB — CBC WITH DIFFERENTIAL/PLATELET
Basophils Absolute: 0 10*3/uL (ref 0.0–0.1)
Eosinophils Absolute: 0 10*3/uL (ref 0.0–0.7)
Eosinophils Relative: 0 % (ref 0–5)
MCH: 28.5 pg (ref 26.0–34.0)
MCV: 82.4 fL (ref 78.0–100.0)
Platelets: 433 10*3/uL — ABNORMAL HIGH (ref 150–400)
RDW: 15.7 % — ABNORMAL HIGH (ref 11.5–15.5)

## 2012-07-02 MED ORDER — ONDANSETRON HCL 4 MG/2ML IJ SOLN
4.0000 mg | Freq: Once | INTRAMUSCULAR | Status: AC
  Administered 2012-07-02: 4 mg via INTRAVENOUS
  Filled 2012-07-02: qty 2

## 2012-07-02 MED ORDER — KETOROLAC TROMETHAMINE 30 MG/ML IJ SOLN
30.0000 mg | Freq: Once | INTRAMUSCULAR | Status: AC
  Administered 2012-07-02: 30 mg via INTRAVENOUS
  Filled 2012-07-02: qty 1

## 2012-07-02 MED ORDER — ALBUTEROL SULFATE (5 MG/ML) 0.5% IN NEBU
5.0000 mg | INHALATION_SOLUTION | Freq: Once | RESPIRATORY_TRACT | Status: AC
  Administered 2012-07-02: 5 mg via RESPIRATORY_TRACT
  Filled 2012-07-02: qty 1

## 2012-07-02 MED ORDER — HYDROMORPHONE HCL PF 1 MG/ML IJ SOLN
1.0000 mg | Freq: Once | INTRAMUSCULAR | Status: AC
  Administered 2012-07-02: 1 mg via INTRAVENOUS
  Filled 2012-07-02: qty 1

## 2012-07-02 MED ORDER — IPRATROPIUM BROMIDE 0.02 % IN SOLN
0.5000 mg | Freq: Once | RESPIRATORY_TRACT | Status: AC
  Administered 2012-07-02: 0.5 mg via RESPIRATORY_TRACT
  Filled 2012-07-02: qty 2.5

## 2012-07-02 NOTE — ED Notes (Signed)
[id number]<AV> Expected date:<BR> Expected time:<BR> Means of arrival:<BR> Comments:<BR> EMS, 67 M, Back Pain

## 2012-07-02 NOTE — ED Notes (Signed)
Patient no longer in waiting room.

## 2012-07-02 NOTE — ED Notes (Signed)
Patient describing more of a left sided rib pain.  Denies SOB.  Pt reports history of smoking for 40 years.  Nausea but no vomiting.  Patient has chronic cough and it hurts more when he coughs.  Patient reports he has had this pain for several months, but it has increased in severity in the past week.  Patient denies any injury to the area.

## 2012-07-02 NOTE — ED Notes (Signed)
Patient with history of back pain, had surgery six months ago started with right flank pain about a week ago.  Rates pain an 8/10.  Patient reports his doctor changed his pain medication about a week ago and he has not had a bowel movement since.  Patient also reports nausea and vomiting over the past week.  EMS gave him 4mg  Zofran IV.

## 2012-07-02 NOTE — ED Notes (Signed)
Pt notified that urine is needed.  Urinal at bedside. 

## 2012-07-03 LAB — COMPREHENSIVE METABOLIC PANEL
ALT: 33 U/L (ref 0–53)
AST: 27 U/L (ref 0–37)
Calcium: 9.3 mg/dL (ref 8.4–10.5)
GFR calc Af Amer: >90 mL/min (ref >90)
Glucose, Bld: 109 mg/dL — ABNORMAL HIGH (ref 70–99)
Sodium: 126 mEq/L — ABNORMAL LOW (ref 135–145)
Total Protein: 7.5 g/dL (ref 6.0–8.3)

## 2012-07-03 LAB — URINALYSIS, ROUTINE W REFLEX MICROSCOPIC
Hgb urine dipstick: NEGATIVE
Specific Gravity, Urine: 1.022 (ref 1.005–1.030)
Urobilinogen, UA: 0.2 mg/dL (ref 0.0–1.0)

## 2012-07-03 MED ORDER — SODIUM CHLORIDE 0.9 % IV BOLUS (SEPSIS)
1000.0000 mL | Freq: Once | INTRAVENOUS | Status: AC
  Administered 2012-07-03: 1000 mL via INTRAVENOUS

## 2012-07-03 NOTE — ED Provider Notes (Signed)
History     CSN: [id number]  Arrival date & time 07/02/12  2223   First MD Initiated Contact with Patient 07/02/12 2233      Chief Complaint  Patient presents with  . Flank Pain   HPI  History provided by the patient and significant other. Patient is a 67 year old male with history of hypertension, hyperlipidemia, CAD, COPD, chronic back pain with previous back surgeries, cholecystectomy who presents with complaints of right back and side pain. Patient last had back surgery 6 months ago and has had some chronic pains to his similar area of the right back and side. History of is chronic pains with hydrocodone which she has been taking. Over the past week or so pain has increased significantly. Patient states he was evaluated by his doctors including several x-rays to look over his previous surgeries without any concerning findings. He states his doctor changed his pain medication to oxycodone which did help some but he continues to have pain. Patient also reports having a few episodes of nausea and vomiting during the week. No vomiting today. Denies any recent fever, chills or sweats. Denies any shortness of breath or difficulty breathing. No cough or hemoptysis. Denies any urinary complaints. No dysuria, hematuria urinary frequency.   Past Medical History  Diagnosis Date  . Myocardial infarction   . Hyperlipidemia   . Depression   . Emphysema of lung   . Tremor   . Headache, chronic daily   . History of prostate cancer   . Cancer 2004    prostate  . On home O2   . Aspiration pneumonia   . Stroke     "mini strokes"; denies residual  . Emphysema   . Neuromuscular disorder 1998    right carpal tunnel release  . Hypertension     [employer]  . Anemia associated with acute blood loss     Past Surgical History  Procedure Laterality Date  . Cholecystectomy  1996  . 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 Jordan Likes  . Vascular stent  2000    Dr. Alanda Amass  . Hand surgery  1989   crushed thumb, Dr. Merlyn Lot  . Ulnar nerve repair  1998    left arm, Dr. Merlyn Lot  . Gastrostomy w/ feeding tube  2008    Dr. Ewing Schlein  . 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 Prostatectomy  04/2001    removal of prostate cancer, Dr. Brunilda Payor  . Coronary angioplasty with stent placement  04/1998  . Carpal tunnel release  1998    right  . Back surgery  08/2004; 02/2005; 04/2006; 06/2007; 7/20101    all by Dr. Jordan Likes  . Spine surgery      Family History  Problem Relation Age of Onset  . Heart disease Mother   . Cancer Father     brain cancer and prostate cancer     History  Substance Use Topics  . Smoking status: Current Every Day Smoker -- 0.50 packs/day for 50 years    Types: Cigarettes    Last Attempt to Quit: 12/15/2010  . Smokeless tobacco: Never Used     Comment: pt started back may 2013.pt  . Alcohol Use: No      Review of Systems  Constitutional: Negative for fever, chills and diaphoresis.  Respiratory: Negative for shortness of breath.   Cardiovascular: Negative for chest pain.  Gastrointestinal: Positive for nausea. Negative for abdominal pain, diarrhea and constipation.  Musculoskeletal: Positive  for back pain.  All other systems reviewed and are negative.    Allergies  Lisinopril and Lamictal  Home Medications   Current Outpatient Rx  Name  Route  Sig  Dispense  Refill  . acetaminophen (TYLENOL) 325 MG tablet   Oral   Take 650 mg by mouth every 6 (six) hours as needed. For pain         . albuterol (PROVENTIL HFA;VENTOLIN HFA) 108 (90 BASE) MCG/ACT inhaler   Inhalation   Inhale 2 puffs into the lungs every 6 (six) hours as needed. For shortness of breath   1 Inhaler   1   . ARIPiprazole (ABILIFY) 5 MG tablet   Oral   Take 5 mg by mouth daily. Taking half tablet daily.         . Ascorbic Acid (VITAMIN C) 500 MG tablet   Oral   Take 1,000 mg by mouth daily.          Marland Kitchen aspirin 81 MG EC tablet   Oral   Take 81 mg by mouth daily.          Marland Kitchen  aspirin-acetaminophen-caffeine (EXCEDRIN MIGRAINE) 250-250-65 MG per tablet   Oral   Take 2 tablets by mouth every 6 (six) hours as needed. For headaches         . budesonide-formoterol (SYMBICORT) 160-4.5 MCG/ACT inhaler   Inhalation   Inhale 2 puffs into the lungs 2 (two) times daily.   1 Inhaler   3   . cholecalciferol (VITAMIN D) 1000 UNITS tablet   Oral   Take 1,000 Units by mouth daily.         . diazepam (VALIUM) 5 MG tablet   Oral   Take 5 mg by mouth 2 (two) times daily.          . divalproex (DEPAKOTE) 500 MG 24 hr tablet   Oral   Take 1,000 mg by mouth at bedtime.          . docusate sodium (COLACE) 100 MG capsule   Oral   Take 200 mg by mouth daily.         . DULoxetine (CYMBALTA) 30 MG capsule   Oral   Take 30 mg by mouth daily. Taking 4 tablets daily.         . fluticasone (FLONASE) 50 MCG/ACT nasal spray   Nasal   Place 2 sprays into the nose daily.   16 g   3   . guaiFENesin-codeine (ROBITUSSIN AC) 100-10 MG/5ML syrup   Oral   Take 10 mLs by mouth 4 (four) times daily as needed for cough.   240 mL   0   . HYDROcodone-acetaminophen (NORCO/VICODIN) 5-325 MG per tablet   Oral   Take 1 tablet by mouth every 6 (six) hours as needed for pain.         Marland Kitchen losartan-hydrochlorothiazide (HYZAAR) 50-12.5 MG per tablet   Oral   Take 1 tablet by mouth daily.         . metoprolol succinate (TOPROL-XL) 100 MG 24 hr tablet   Oral   Take 1 tablet (100 mg total) by mouth daily. Take with or immediately following a meal.   90 tablet   3   . montelukast (SINGULAIR) 10 MG tablet   Oral   Take 1 tablet (10 mg total) by mouth at bedtime.   30 tablet   3   . nitroGLYCERIN (NITROSTAT) 0.4 MG SL tablet   Sublingual   Place  1 tablet (0.4 mg total) under the tongue every 5 (five) minutes as needed. For chest pain   10 tablet   0   . Omega-3 Fatty Acids (FISH OIL TRIPLE STRENGTH) 1400 MG CAPS   Oral   Take 1 capsule by mouth daily.         Marland Kitchen  omeprazole (PRILOSEC) 40 MG capsule   Oral   Take 40 mg by mouth 2 (two) times daily.          . ondansetron (ZOFRAN) 8 MG tablet   Oral   Take 1 tablet (8 mg total) by mouth every 8 (eight) hours as needed. For nausea   30 tablet   0   . PARoxetine (PAXIL) 30 MG tablet   Oral   Take 15 mg by mouth every morning.         Marland Kitchen Phenyleph-CPM-DM-APAP (ALKA-SELTZER PLUS COLD & COUGH) 06-11-08-325 MG CAPS   Oral   Take 2 tablets by mouth daily.          . polyethylene glycol (MIRALAX / GLYCOLAX) packet   Oral   Take 17 g by mouth daily as needed. For constipation         . primidone (MYSOLINE) 250 MG tablet   Oral   Take 1 tablet (250 mg total) by mouth 2 (two) times daily.   60 tablet   6   . simvastatin (ZOCOR) 40 MG tablet   Oral   Take 1 tablet (40 mg total) by mouth every evening.   90 tablet   3   . tiotropium (SPIRIVA HANDIHALER) 18 MCG inhalation capsule   Inhalation   Place 1 capsule (18 mcg total) into inhaler and inhale daily.   30 capsule   6   . Zinc 50 MG TABS   Oral   Take 50 mg by mouth daily.            BP 118/83  Pulse 107  Temp(Src) 98.2 F (36.8 C) (Oral)  Resp 16  SpO2 96%  Physical Exam  Nursing note and vitals reviewed. Constitutional: He is oriented to person, place, and time. He appears well-developed and well-nourished. No distress.  HENT:  Head: Normocephalic.  Cardiovascular: Normal rate and regular rhythm.   Pulmonary/Chest: Effort normal and breath sounds normal. No respiratory distress. He has no wheezes. He has no rales.  Abdominal: Soft. There is tenderness in the right upper quadrant. There is no rigidity, no rebound, no guarding, no tenderness at McBurney's point and negative Murphy's sign.    Musculoskeletal: Normal range of motion.       Thoracic back: He exhibits tenderness.       Back:  Large old surgical scar down the midline of the back consistent with history of previous surgeries. There is a deformity  consistent with surgical medical rod placement along the right lateral aspect of the spine. Skin normal in appearance. No erythema or edema.  Neurological: He is alert and oriented to person, place, and time. He has normal strength. No sensory deficit.  Skin: Skin is warm.  Psychiatric: He has a normal mood and affect. His behavior is normal.    ED Course  Procedures   Results for orders placed during the hospital encounter of 07/02/12  URINALYSIS, ROUTINE W REFLEX MICROSCOPIC      Result Value Range   Color, Urine YELLOW  YELLOW   APPearance CLEAR  CLEAR   Specific Gravity, Urine 1.022  1.005 - 1.030   pH 7.5  5.0 - 8.0   Glucose, UA NEGATIVE  NEGATIVE mg/dL   Hgb urine dipstick NEGATIVE  NEGATIVE   Bilirubin Urine NEGATIVE  NEGATIVE   Ketones, ur NEGATIVE  NEGATIVE mg/dL   Protein, ur NEGATIVE  NEGATIVE mg/dL   Urobilinogen, UA 0.2  0.0 - 1.0 mg/dL   Nitrite NEGATIVE  NEGATIVE   Leukocytes, UA NEGATIVE  NEGATIVE  CBC WITH DIFFERENTIAL      Result Value Range   WBC 8.7  4.0 - 10.5 K/uL   RBC 3.97 (*) 4.22 - 5.81 MIL/uL   Hemoglobin 11.3 (*) 13.0 - 17.0 g/dL   HCT 16.1 (*) 09.6 - 04.5 %   MCV 82.4  78.0 - 100.0 fL   MCH 28.5  26.0 - 34.0 pg   MCHC 34.6  30.0 - 36.0 g/dL   RDW 40.9 (*) 81.1 - 91.4 %   Platelets 433 (*) 150 - 400 K/uL   Neutrophils Relative % 76  43 - 77 %   Neutro Abs 6.6  1.7 - 7.7 K/uL   Lymphocytes Relative 15  12 - 46 %   Lymphs Abs 1.3  0.7 - 4.0 K/uL   Monocytes Relative 9  3 - 12 %   Monocytes Absolute 0.8  0.1 - 1.0 K/uL   Eosinophils Relative 0  0 - 5 %   Eosinophils Absolute 0.0  0.0 - 0.7 K/uL   Basophils Relative 0  0 - 1 %   Basophils Absolute 0.0  0.0 - 0.1 K/uL  COMPREHENSIVE METABOLIC PANEL      Result Value Range   Sodium 126 (*) 135 - 145 mEq/L   Potassium 4.0  3.5 - 5.1 mEq/L   Chloride 89 (*) 96 - 112 mEq/L   CO2 26  19 - 32 mEq/L   Glucose, Bld 109 (*) 70 - 99 mg/dL   BUN 11  6 - 23 mg/dL   Creatinine, Ser 7.82  0.50 - 1.35  mg/dL   Calcium 9.3  8.4 - 95.6 mg/dL   Total Protein 7.5  6.0 - 8.3 g/dL   Albumin 3.0 (*) 3.5 - 5.2 g/dL   AST 27  0 - 37 U/L   ALT 33  0 - 53 U/L   Alkaline Phosphatase 140 (*) 39 - 117 U/L   Total Bilirubin 0.1 (*) 0.3 - 1.2 mg/dL   GFR calc non Af Amer >90  >90 mL/min   GFR calc Af Amer >90  >90 mL/min       Dg Ribs Unilateral W/chest Right  07/03/2012   *RADIOLOGY REPORT*  Clinical Data: Anterior lower rib pain, radiating posteriorly.  RIGHT RIBS AND CHEST - 3+ VIEW  Comparison: None.  Findings: A vague density at the left lung base may reflect an overlying nipple shadow.  Would suggest repeat chest radiograph with nipple markers.  The lungs are otherwise grossly clear.  No pleural effusion or pneumothorax is seen.  The cardiomediastinal silhouette is normal in size.  Thoracolumbar spinal fusion hardware is partially imaged.  No displaced rib fractures are seen.  Clips are noted within the right upper quadrant, reflecting prior cholecystectomy.  Postoperative change is noted at the right distal humerus.  IMPRESSION:  1.  Vague density at the left lung base may reflect an overlying nipple shadow.  Recommend repeat chest radiograph with nipple markers. 2.  Lungs otherwise clear.  No displaced rib fractures seen.   Original Report Authenticated By: Tonia Ghent, M.D.     1. Back pain  2. Hyponatremia       MDM  Patient seen and evaluated. Patient appears in some discomfort.  Patient feeling much better after medications and sleeping now. Labs unremarkable. X-ray also unremarkable. This time suspect pain is from exacerbation of chronic pain. Patient feels ready to be discharged home.     Angus Seller, PA-C 07/03/12 [phone]

## 2012-07-06 NOTE — ED Provider Notes (Signed)
Medical screening examination/treatment/procedure(s) were performed by non-physician practitioner and as supervising physician I was immediately available for consultation/collaboration.  Kyndra Condron J. Ebubechukwu Jedlicka, MD 07/06/12 0732 

## 2012-07-07 ENCOUNTER — Encounter: Payer: Self-pay | Admitting: Pulmonary Disease

## 2012-07-07 ENCOUNTER — Ambulatory Visit (INDEPENDENT_AMBULATORY_CARE_PROVIDER_SITE_OTHER): Payer: Medicare Other | Admitting: Pulmonary Disease

## 2012-07-07 VITALS — BP 106/68 | HR 92 | Temp 97.2°F | Ht 71.0 in | Wt 155.0 lb

## 2012-07-07 DIAGNOSIS — J439 Emphysema, unspecified: Secondary | ICD-10-CM

## 2012-07-07 DIAGNOSIS — M545 Low back pain: Secondary | ICD-10-CM | POA: Diagnosis not present

## 2012-07-07 DIAGNOSIS — J438 Other emphysema: Secondary | ICD-10-CM

## 2012-07-07 DIAGNOSIS — E871 Hypo-osmolality and hyponatremia: Secondary | ICD-10-CM | POA: Diagnosis not present

## 2012-07-07 NOTE — Progress Notes (Signed)
  Subjective:    Patient ID: Malik Zuniga, male    DOB: 1945/04/15, 67 y.o.   MRN: [id number]  HPI Patient comes in today for followup of his known COPD.  He is wearing oxygen at night while sleeping, and is staying on his bronchodilator regimen.  He denies having an acute exacerbation or pulmonary infection since last visit.  He denies any chest congestion or purulent mucus, but unfortunately continues to smoke.   Review of Systems  Constitutional: Negative for fever and unexpected weight change.  HENT: Negative for ear pain, nosebleeds, congestion, sore throat, rhinorrhea, sneezing, trouble swallowing, dental problem, postnasal drip and sinus pressure.   Eyes: Negative for redness and itching.  Respiratory: Positive for wheezing. Negative for cough, chest tightness and shortness of breath.   Cardiovascular: Negative for palpitations and leg swelling.  Gastrointestinal: Negative for nausea and vomiting.  Genitourinary: Negative for dysuria.  Musculoskeletal: Negative for joint swelling.  Skin: Negative for rash.  Neurological: Negative for headaches.  Hematological: Does not bruise/bleed easily.  Psychiatric/Behavioral: Negative for dysphoric mood. The patient is not nervous/anxious.        Objective:   Physical Exam Thin male in no acute distress Nose without purulence or discharge noted Neck without lymphadenopathy or thyromegaly Chest with decreased breath sounds and a few rhonchi, no wheezes or crackles Cardiac exam with regular rate and rhythm Lower extremities without edema, no cyanosis Alert and oriented, moves all 4 extremities.       Assessment & Plan:

## 2012-07-07 NOTE — Patient Instructions (Addendum)
Continue oxygen while sleeping only Stay on current pulmonary meds. Work on stopping smoking as we discussed. followup with me in 6mos if doing well.

## 2012-07-07 NOTE — Assessment & Plan Note (Addendum)
The patient is stable from a pulmonary standpoint, with no recent exacerbation.  He is on a good bronchodilator regimen, and I have asked him to continue this.  I stressed to him the importance of total smoking cessation, and have reviewed various ways to accomplish this.  I have also asked him to stay as active as possible and work on conditioning.  Unfortunately, he has significant musculoskeletal issues that interfere with this.

## 2012-07-09 ENCOUNTER — Other Ambulatory Visit: Payer: Self-pay | Admitting: Family Medicine

## 2012-07-09 NOTE — Telephone Encounter (Signed)
Denied refill request.  Pt will need an appt per Dr. [name]

## 2012-07-13 ENCOUNTER — Telehealth: Payer: Self-pay | Admitting: Family Medicine

## 2012-07-13 NOTE — Telephone Encounter (Signed)
Pt when to the ED on 07-02-12.//AB/CMA

## 2012-07-13 NOTE — Telephone Encounter (Signed)
[facility] Triage Call Report Triage Record Num: [id number] Operator: Andreas Ohm Patient Name: Malik Zuniga Call Date & Time: 07/01/2012 4:39:50PM Patient Phone: [phone] PCP: Lezlie Octave Patient Gender: Male PCP Fax : [phone] Patient DOB: 09/08/1945 Practice Name: [facility] Reason for Call: Caller: Ventura Sellers; PCP: Sheliah Hatch.; CB#: [phone]; Call regarding Right side of abd/rib cage that radiates to his back.; No know injury. Pain has intensified within the past 5 days. Onset of pain5/16/14.Pt has been to surgeon and Xray confirm hardware placement is good. Pain intensifies with breathing, sneezing, and moving. Pt is usring ace wraps and heat and getting no relief. This pain is new in regard to the intensity. Afebrile. See ED d/t 'New Onset of severe disabling back pain ' per Back Symptoms. Caller voiced they will go to [facility]. Protocol(s) Used: Back Symptoms Recommended Outcome per Protocol: See ED Immediately Reason for Outcome: New onset of severe disabling back pain (unable to stand upright) Care Advice: ~ 05/

## 2012-07-15 ENCOUNTER — Other Ambulatory Visit: Payer: Self-pay | Admitting: General Practice

## 2012-07-15 ENCOUNTER — Telehealth: Payer: Self-pay | Admitting: General Practice

## 2012-07-15 MED ORDER — ONDANSETRON 4 MG PO TBDP: 4.0000 mg | ORAL_TABLET | Freq: Three times a day (TID) | ORAL | Status: DC | PRN

## 2012-07-15 NOTE — Telephone Encounter (Signed)
Med filled.  

## 2012-07-15 NOTE — Telephone Encounter (Signed)
Pt wife called wanting a refill of pt Zofran sent to [facility] on [address]. Need to make sure it is sublingual that disintegrates not a tablet.   Last OV 05-06-12 Med last filled 01-27-12

## 2012-07-15 NOTE — Telephone Encounter (Signed)
Ok for #20 Zofran 4mg  ODT

## 2012-07-20 ENCOUNTER — Other Ambulatory Visit: Payer: Self-pay | Admitting: Family Medicine

## 2012-07-21 NOTE — Telephone Encounter (Signed)
Rx sent to the pharmacy e-script.//AB/CMA 

## 2012-07-22 ENCOUNTER — Emergency Department (HOSPITAL_COMMUNITY): Payer: Medicare Other

## 2012-07-22 ENCOUNTER — Other Ambulatory Visit: Payer: Self-pay

## 2012-07-22 ENCOUNTER — Encounter (HOSPITAL_COMMUNITY): Payer: Self-pay | Admitting: Emergency Medicine

## 2012-07-22 ENCOUNTER — Inpatient Hospital Stay (HOSPITAL_COMMUNITY)
Admission: EM | Admit: 2012-07-22 | Discharge: 2012-07-24 | DRG: 392 | Disposition: A | Discharge status: Home / Self Care | Payer: Medicare Other | Attending: Internal Medicine | Admitting: Internal Medicine

## 2012-07-22 DIAGNOSIS — B379 Candidiasis, unspecified: Secondary | ICD-10-CM

## 2012-07-22 DIAGNOSIS — R7401 Elevation of levels of liver transaminase levels: Secondary | ICD-10-CM | POA: Diagnosis present

## 2012-07-22 DIAGNOSIS — E878 Other disorders of electrolyte and fluid balance, not elsewhere classified: Secondary | ICD-10-CM | POA: Diagnosis present

## 2012-07-22 DIAGNOSIS — M545 Low back pain, unspecified: Secondary | ICD-10-CM | POA: Diagnosis not present

## 2012-07-22 DIAGNOSIS — R296 Repeated falls: Secondary | ICD-10-CM

## 2012-07-22 DIAGNOSIS — Z9861 Coronary angioplasty status: Secondary | ICD-10-CM | POA: Diagnosis not present

## 2012-07-22 DIAGNOSIS — G8929 Other chronic pain: Secondary | ICD-10-CM | POA: Diagnosis not present

## 2012-07-22 DIAGNOSIS — D62 Acute posthemorrhagic anemia: Secondary | ICD-10-CM

## 2012-07-22 DIAGNOSIS — R1013 Epigastric pain: Secondary | ICD-10-CM | POA: Diagnosis present

## 2012-07-22 DIAGNOSIS — I10 Essential (primary) hypertension: Secondary | ICD-10-CM

## 2012-07-22 DIAGNOSIS — C61 Malignant neoplasm of prostate: Secondary | ICD-10-CM

## 2012-07-22 DIAGNOSIS — K5902 Outlet dysfunction constipation: Secondary | ICD-10-CM | POA: Diagnosis not present

## 2012-07-22 DIAGNOSIS — F319 Bipolar disorder, unspecified: Secondary | ICD-10-CM | POA: Diagnosis present

## 2012-07-22 DIAGNOSIS — A498 Other bacterial infections of unspecified site: Secondary | ICD-10-CM

## 2012-07-22 DIAGNOSIS — J438 Other emphysema: Secondary | ICD-10-CM | POA: Diagnosis present

## 2012-07-22 DIAGNOSIS — R112 Nausea with vomiting, unspecified: Secondary | ICD-10-CM | POA: Diagnosis not present

## 2012-07-22 DIAGNOSIS — R633 Feeding difficulties, unspecified: Secondary | ICD-10-CM | POA: Diagnosis not present

## 2012-07-22 DIAGNOSIS — Z7982 Long term (current) use of aspirin: Secondary | ICD-10-CM

## 2012-07-22 DIAGNOSIS — R259 Unspecified abnormal involuntary movements: Secondary | ICD-10-CM

## 2012-07-22 DIAGNOSIS — K3184 Gastroparesis: Principal | ICD-10-CM | POA: Diagnosis present

## 2012-07-22 DIAGNOSIS — F17200 Nicotine dependence, unspecified, uncomplicated: Secondary | ICD-10-CM | POA: Diagnosis present

## 2012-07-22 DIAGNOSIS — R9389 Abnormal findings on diagnostic imaging of other specified body structures: Secondary | ICD-10-CM | POA: Diagnosis not present

## 2012-07-22 DIAGNOSIS — R141 Gas pain: Secondary | ICD-10-CM | POA: Diagnosis not present

## 2012-07-22 DIAGNOSIS — Z8673 Personal history of transient ischemic attack (TIA), and cerebral infarction without residual deficits: Secondary | ICD-10-CM | POA: Diagnosis not present

## 2012-07-22 DIAGNOSIS — E871 Hypo-osmolality and hyponatremia: Secondary | ICD-10-CM | POA: Diagnosis not present

## 2012-07-22 DIAGNOSIS — M419 Scoliosis, unspecified: Secondary | ICD-10-CM

## 2012-07-22 DIAGNOSIS — F329 Major depressive disorder, single episode, unspecified: Secondary | ICD-10-CM

## 2012-07-22 DIAGNOSIS — D485 Neoplasm of uncertain behavior of skin: Secondary | ICD-10-CM

## 2012-07-22 DIAGNOSIS — N281 Cyst of kidney, acquired: Secondary | ICD-10-CM | POA: Diagnosis not present

## 2012-07-22 DIAGNOSIS — Z8679 Personal history of other diseases of the circulatory system: Secondary | ICD-10-CM

## 2012-07-22 DIAGNOSIS — K5900 Constipation, unspecified: Secondary | ICD-10-CM | POA: Diagnosis present

## 2012-07-22 DIAGNOSIS — K311 Adult hypertrophic pyloric stenosis: Secondary | ICD-10-CM | POA: Diagnosis not present

## 2012-07-22 DIAGNOSIS — J302 Other seasonal allergic rhinitis: Secondary | ICD-10-CM

## 2012-07-22 DIAGNOSIS — K310 Acute dilatation of stomach: Secondary | ICD-10-CM | POA: Diagnosis not present

## 2012-07-22 DIAGNOSIS — Z8546 Personal history of malignant neoplasm of prostate: Secondary | ICD-10-CM

## 2012-07-22 DIAGNOSIS — R109 Unspecified abdominal pain: Secondary | ICD-10-CM | POA: Diagnosis not present

## 2012-07-22 DIAGNOSIS — Z79899 Other long term (current) drug therapy: Secondary | ICD-10-CM

## 2012-07-22 DIAGNOSIS — R059 Cough, unspecified: Secondary | ICD-10-CM | POA: Diagnosis not present

## 2012-07-22 DIAGNOSIS — E785 Hyperlipidemia, unspecified: Secondary | ICD-10-CM | POA: Diagnosis not present

## 2012-07-22 DIAGNOSIS — I6789 Other cerebrovascular disease: Secondary | ICD-10-CM

## 2012-07-22 DIAGNOSIS — R7402 Elevation of levels of lactic acid dehydrogenase (LDH): Secondary | ICD-10-CM | POA: Diagnosis present

## 2012-07-22 DIAGNOSIS — R933 Abnormal findings on diagnostic imaging of other parts of digestive tract: Secondary | ICD-10-CM | POA: Diagnosis not present

## 2012-07-22 DIAGNOSIS — R110 Nausea: Secondary | ICD-10-CM | POA: Diagnosis not present

## 2012-07-22 DIAGNOSIS — A419 Sepsis, unspecified organism: Secondary | ICD-10-CM

## 2012-07-22 DIAGNOSIS — I252 Old myocardial infarction: Secondary | ICD-10-CM | POA: Diagnosis not present

## 2012-07-22 DIAGNOSIS — R062 Wheezing: Secondary | ICD-10-CM | POA: Diagnosis not present

## 2012-07-22 DIAGNOSIS — I219 Acute myocardial infarction, unspecified: Secondary | ICD-10-CM

## 2012-07-22 DIAGNOSIS — K5909 Other constipation: Secondary | ICD-10-CM | POA: Diagnosis present

## 2012-07-22 DIAGNOSIS — Z0000 Encounter for general adult medical examination without abnormal findings: Secondary | ICD-10-CM

## 2012-07-22 DIAGNOSIS — Z4682 Encounter for fitting and adjustment of non-vascular catheter: Secondary | ICD-10-CM | POA: Diagnosis not present

## 2012-07-22 DIAGNOSIS — J439 Emphysema, unspecified: Secondary | ICD-10-CM

## 2012-07-22 DIAGNOSIS — T40605A Adverse effect of unspecified narcotics, initial encounter: Secondary | ICD-10-CM | POA: Diagnosis present

## 2012-07-22 DIAGNOSIS — Z9189 Other specified personal risk factors, not elsewhere classified: Secondary | ICD-10-CM

## 2012-07-22 DIAGNOSIS — R7881 Bacteremia: Secondary | ICD-10-CM

## 2012-07-22 DIAGNOSIS — R05 Cough: Secondary | ICD-10-CM | POA: Diagnosis not present

## 2012-07-22 LAB — URINALYSIS, ROUTINE W REFLEX MICROSCOPIC
Bilirubin Urine: NEGATIVE
Glucose, UA: NEGATIVE mg/dL
Hgb urine dipstick: NEGATIVE
Specific Gravity, Urine: 1.029 (ref 1.005–1.030)
pH: 8.5 — ABNORMAL HIGH (ref 5.0–8.0)

## 2012-07-22 LAB — COMPREHENSIVE METABOLIC PANEL
Albumin: 3.2 g/dL — ABNORMAL LOW (ref 3.5–5.2)
BUN: 9 mg/dL (ref 6–23)
CO2: 30 mEq/L (ref 19–32)
Chloride: 90 mEq/L — ABNORMAL LOW (ref 96–112)
Creatinine, Ser: 0.64 mg/dL (ref 0.50–1.35)
GFR calc Af Amer: >90 mL/min (ref >90)
GFR calc non Af Amer: >90 mL/min (ref >90)
Glucose, Bld: 101 mg/dL — ABNORMAL HIGH (ref 70–99)
Total Bilirubin: 0.2 mg/dL — ABNORMAL LOW (ref 0.3–1.2)

## 2012-07-22 LAB — CBC WITH DIFFERENTIAL/PLATELET
Basophils Relative: 0 % (ref 0–1)
HCT: 33 % — ABNORMAL LOW (ref 39.0–52.0)
Hemoglobin: 11.6 g/dL — ABNORMAL LOW (ref 13.0–17.0)
Lymphs Abs: 1.4 10*3/uL (ref 0.7–4.0)
MCHC: 35.2 g/dL (ref 30.0–36.0)
Monocytes Absolute: 0.7 10*3/uL (ref 0.1–1.0)
Monocytes Relative: 7 % (ref 3–12)
Neutro Abs: 8.1 10*3/uL — ABNORMAL HIGH (ref 1.7–7.7)

## 2012-07-22 LAB — OCCULT BLOOD, POC DEVICE: Fecal Occult Bld: NEGATIVE

## 2012-07-22 LAB — LIPASE, BLOOD: Lipase: 20 U/L (ref 11–59)

## 2012-07-22 LAB — SAMPLE TO BLOOD BANK

## 2012-07-22 LAB — TYPE AND SCREEN: Antibody Screen: NEGATIVE

## 2012-07-22 MED ORDER — DIAZEPAM 5 MG PO TABS
5.0000 mg | ORAL_TABLET | Freq: Two times a day (BID) | ORAL | Status: DC
  Administered 2012-07-22 – 2012-07-24 (×4): 5 mg via ORAL
  Filled 2012-07-22 (×4): qty 1

## 2012-07-22 MED ORDER — MORPHINE SULFATE 4 MG/ML IJ SOLN
4.0000 mg | Freq: Once | INTRAMUSCULAR | Status: AC
  Administered 2012-07-22: 4 mg via INTRAVENOUS
  Filled 2012-07-22: qty 1

## 2012-07-22 MED ORDER — DULOXETINE HCL 60 MG PO CPEP
60.0000 mg | ORAL_CAPSULE | Freq: Every day | ORAL | Status: DC
  Administered 2012-07-23 – 2012-07-24 (×2): 60 mg via ORAL
  Filled 2012-07-22 (×2): qty 1

## 2012-07-22 MED ORDER — SODIUM CHLORIDE 0.9 % IV SOLN
INTRAVENOUS | Status: DC
  Administered 2012-07-22 – 2012-07-24 (×4): via INTRAVENOUS

## 2012-07-22 MED ORDER — LOSARTAN POTASSIUM 50 MG PO TABS: 50.0000 mg | ORAL_TABLET | Freq: Every day | ORAL | Status: DC

## 2012-07-22 MED ORDER — SODIUM CHLORIDE 0.9 % IV BOLUS (SEPSIS)
500.0000 mL | Freq: Once | INTRAVENOUS | Status: AC
  Administered 2012-07-22: 500 mL via INTRAVENOUS

## 2012-07-22 MED ORDER — METOPROLOL SUCCINATE ER 50 MG PO TB24
50.0000 mg | ORAL_TABLET | Freq: Every day | ORAL | Status: DC
  Administered 2012-07-23 – 2012-07-24 (×2): 50 mg via ORAL
  Filled 2012-07-22 (×2): qty 1

## 2012-07-22 MED ORDER — IOHEXOL 300 MG/ML  SOLN
100.0000 mL | Freq: Once | INTRAMUSCULAR | Status: AC | PRN
  Administered 2012-07-22: 100 mL via INTRAVENOUS

## 2012-07-22 MED ORDER — IOHEXOL 300 MG/ML  SOLN
25.0000 mL | INTRAMUSCULAR | Status: DC
  Administered 2012-07-22: 25 mL via ORAL

## 2012-07-22 MED ORDER — TIOTROPIUM BROMIDE MONOHYDRATE 18 MCG IN CAPS
18.0000 ug | ORAL_CAPSULE | Freq: Every day | RESPIRATORY_TRACT | Status: DC
  Administered 2012-07-23 – 2012-07-24 (×2): 18 ug via RESPIRATORY_TRACT
  Filled 2012-07-22: qty 5

## 2012-07-22 MED ORDER — HYDROCHLOROTHIAZIDE 12.5 MG PO CAPS: 12.5000 mg | ORAL_CAPSULE | Freq: Every day | ORAL | Status: DC

## 2012-07-22 MED ORDER — MORPHINE SULFATE 4 MG/ML IJ SOLN: 4.0000 mg | INTRAMUSCULAR | Status: DC | PRN

## 2012-07-22 MED ORDER — ONDANSETRON HCL 4 MG/2ML IJ SOLN
4.0000 mg | Freq: Once | INTRAMUSCULAR | Status: AC
  Administered 2012-07-22: 4 mg via INTRAVENOUS
  Filled 2012-07-22: qty 2

## 2012-07-22 MED ORDER — LIDOCAINE VISCOUS 2 % MT SOLN
15.0000 mL | Freq: Once | OROMUCOSAL | Status: AC
  Administered 2012-07-22: 15 mL via OROMUCOSAL
  Filled 2012-07-22: qty 15

## 2012-07-22 MED ORDER — ONDANSETRON HCL 4 MG/2ML IJ SOLN
4.0000 mg | Freq: Once | INTRAMUSCULAR | Status: DC
  Filled 2012-07-22 (×2): qty 2

## 2012-07-22 MED ORDER — SIMVASTATIN 40 MG PO TABS
40.0000 mg | ORAL_TABLET | Freq: Every day | ORAL | Status: DC
  Administered 2012-07-22: 40 mg via ORAL
  Filled 2012-07-22 (×2): qty 1

## 2012-07-22 MED ORDER — PANTOPRAZOLE SODIUM 40 MG IV SOLR
40.0000 mg | Freq: Once | INTRAVENOUS | Status: AC
  Administered 2012-07-22: 40 mg via INTRAVENOUS
  Filled 2012-07-22: qty 40

## 2012-07-22 MED ORDER — ONDANSETRON HCL 4 MG/2ML IJ SOLN
4.0000 mg | Freq: Four times a day (QID) | INTRAMUSCULAR | Status: DC | PRN
  Administered 2012-07-23 – 2012-07-24 (×2): 4 mg via INTRAVENOUS

## 2012-07-22 MED ORDER — MORPHINE SULFATE 2 MG/ML IJ SOLN
2.0000 mg | INTRAMUSCULAR | Status: DC | PRN
  Administered 2012-07-22 – 2012-07-24 (×9): 2 mg via INTRAVENOUS
  Filled 2012-07-22 (×9): qty 1

## 2012-07-22 MED ORDER — ASPIRIN 81 MG PO TBEC: 81.0000 mg | DELAYED_RELEASE_TABLET | Freq: Every day | ORAL | Status: DC

## 2012-07-22 MED ORDER — ONDANSETRON HCL 4 MG PO TABS: 4.0000 mg | ORAL_TABLET | Freq: Four times a day (QID) | ORAL | Status: DC | PRN

## 2012-07-22 MED ORDER — ARIPIPRAZOLE 5 MG PO TABS
5.0000 mg | ORAL_TABLET | Freq: Every day | ORAL | Status: DC
  Administered 2012-07-23 – 2012-07-24 (×2): 5 mg via ORAL
  Filled 2012-07-22 (×2): qty 1

## 2012-07-22 MED ORDER — ASPIRIN EC 81 MG PO TBEC
81.0000 mg | DELAYED_RELEASE_TABLET | Freq: Every day | ORAL | Status: DC
  Administered 2012-07-23 – 2012-07-24 (×2): 81 mg via ORAL
  Filled 2012-07-22 (×2): qty 1

## 2012-07-22 MED ORDER — DIVALPROEX SODIUM ER 500 MG PO TB24
1000.0000 mg | ORAL_TABLET | Freq: Every day | ORAL | Status: DC
  Administered 2012-07-22 – 2012-07-23 (×2): 1000 mg via ORAL
  Filled 2012-07-22 (×3): qty 2

## 2012-07-22 MED ORDER — BUDESONIDE-FORMOTEROL FUMARATE 160-4.5 MCG/ACT IN AERO
2.0000 | INHALATION_SPRAY | Freq: Two times a day (BID) | RESPIRATORY_TRACT | Status: DC
  Administered 2012-07-22 – 2012-07-24 (×4): 2 via RESPIRATORY_TRACT
  Filled 2012-07-22: qty 6

## 2012-07-22 MED ORDER — DIPHENHYDRAMINE HCL 25 MG PO CAPS
25.0000 mg | ORAL_CAPSULE | Freq: Once | ORAL | Status: AC
  Administered 2012-07-22: 25 mg via ORAL
  Filled 2012-07-22: qty 1

## 2012-07-22 MED ORDER — PANTOPRAZOLE SODIUM 40 MG IV SOLR
40.0000 mg | Freq: Two times a day (BID) | INTRAVENOUS | Status: DC
  Administered 2012-07-22 – 2012-07-23 (×2): 40 mg via INTRAVENOUS
  Filled 2012-07-22 (×3): qty 40

## 2012-07-22 MED ORDER — SODIUM CHLORIDE 0.9 % IV BOLUS (SEPSIS): 500.0000 mL | Freq: Once | INTRAVENOUS | Status: DC

## 2012-07-22 NOTE — ED Provider Notes (Signed)
History     CSN: [id number]  Arrival date & time 07/22/12  1118   First MD Initiated Contact with Patient 07/22/12 1420      Chief Complaint  Patient presents with  . Abdominal Pain   (Consider location/radiation/quality/duration/timing/severity/associated sxs/prior treatment) HPI Comments: Mr. Zeitler is a 67 year old white male with extensive PMH including MI s/p stent placement, CVA, HTN, HL, PNA, prostate cancer, COPD, and s/p multiple back surgeries, craniotomy and cholecystectomy presenting to the ED today with complaints of severe epigastric abdominal pain since Sunday.  He explains that since his last back surgery of August 2013, he has been dealing with worsening chronic back pain that he has been taking percocet for.  However, due to the percocet he has been very constipated.  As a result, on Sunday, he decided to take 4 laxative tablets and 5 glasses of miralax that eventually resulted in one large BM.  But since then, he has been having increasing abdominal pain to the point that he could not take it anymore today.  In the meantime, he has continued to take his percocet for back pain, including 4 on Tuesday and then 3 tablets today.  His last BM yes on Tuesday 07/20/12.  The pain in localized to epigastric region, radiating to his back, worse with any movement, and associated with nausea.  He denies any vomiting and is passing gas.  He does not have much of an appetite today but did eat dinner last night.  Mr. Mccorkel continues to smoke cigarettes and reports chills and feeling feverish and clammy today, especially while in the waiting room but did not check his temperature.  He also reports occasional headaches and baseline shortness of breath and urinary hesitance.  He denies any chest pain, vision disturbances, congestion, vomiting, diarrhea, or dysuria at this time.  He denies any alcohol or illicit drug use and denies any similar episode of abdominal pain like this before.  He was  recently seen in the ED on May 23rd for flank pain.  This morning, his wife reports they spoke to his gastroenterologist, Dr. Ewing Schlein and was told to come to the emergency room.  His PCP is Dr. Beverely Low and his cardiologist is Dr. Katrinka Blazing from [place].  Has apparently had colonoscopies in the past with Dr. Ewing Schlein but reports not in epic.  Surgical pathology from 2010 of transverse colon polyp revealed tubular adenoma and no high grade dysplasia or malignancy.    Patient is a 67 y.o. male presenting with abdominal pain. The history is provided by the patient and the spouse. No language interpreter was used.  Abdominal Pain This is a new problem. The current episode started in the past 7 days. The problem occurs constantly. The problem has been gradually worsening. Associated symptoms include abdominal pain, anorexia, arthralgias, chills, coughing, a fever, headaches, nausea and numbness. Pertinent negatives include no chest pain, congestion, diaphoresis, joint swelling, visual change or vomiting. The symptoms are aggravated by twisting, bending and exertion. He has tried oral narcotics and rest for the symptoms. The treatment provided no relief.   Past Medical History  Diagnosis Date  . Myocardial infarction   . Hyperlipidemia   . Depression   . Emphysema of lung   . Tremor   . Headache, chronic daily   . History of prostate cancer   . Cancer 2004    prostate  . On home O2   . Aspiration pneumonia   . Stroke     "mini strokes";  denies residual  . Emphysema   . Neuromuscular disorder 1998    right carpal tunnel release  . Hypertension     [employer]  . Anemia associated with acute blood loss     Past Surgical History  Procedure Laterality Date  . Cholecystectomy  1996  . 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 Jordan Likes  . Vascular stent  2000    Dr. Alanda Amass  . Hand surgery  1989    crushed thumb, Dr. Merlyn Lot  . Ulnar nerve repair  1998    left arm, Dr. Merlyn Lot  . Gastrostomy w/ feeding tube   2008    Dr. Ewing Schlein  . 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 Prostatectomy  04/2001    removal of prostate cancer, Dr. Brunilda Payor  . Coronary angioplasty with stent placement  04/1998  . Carpal tunnel release  1998    right  . Back surgery  08/2004; 02/2005; 04/2006; 06/2007; 7/20101    all by Dr. Jordan Likes  . Spine surgery      Family History  Problem Relation Age of Onset  . Heart disease Mother   . Cancer Father     brain cancer and prostate cancer     History  Substance Use Topics  . Smoking status: Current Every Day Smoker -- 0.50 packs/day for 50 years    Types: Cigarettes    Last Attempt to Quit: 12/15/2010  . Smokeless tobacco: Never Used     Comment: pt started back may 2013.pt  . Alcohol Use: No      Review of Systems  Constitutional: Positive for fever and chills. Negative for diaphoresis.  HENT: Negative for congestion.   Eyes: Negative.  Negative for visual disturbance.  Respiratory: Positive for cough and wheezing. Negative for chest tightness.   Cardiovascular: Negative for chest pain, palpitations and leg swelling.  Gastrointestinal: Positive for nausea, abdominal pain, constipation and anorexia. Negative for vomiting, diarrhea, blood in stool and abdominal distention.  Endocrine: Negative.   Genitourinary: Positive for flank pain and difficulty urinating. Negative for dysuria.  Musculoskeletal: Positive for back pain, arthralgias and gait problem. Negative for joint swelling.       Walks with cane  Skin: Positive for wound.       Sacral irritation  Allergic/Immunologic: Negative.   Neurological: Positive for numbness and headaches. Negative for dizziness and syncope.       Numbness in back, occasional headaches  Hematological: Negative.   Psychiatric/Behavioral: Negative for hallucinations, confusion and agitation. The patient is not nervous/anxious.        Tearful    Allergies  Lisinopril; Ambien; and Lamictal  Home Medications   Current Outpatient Rx   Name  Route  Sig  Dispense  Refill  . acetaminophen (TYLENOL) 325 MG tablet   Oral   Take 650 mg by mouth every 6 (six) hours as needed. For pain         . albuterol (PROVENTIL HFA;VENTOLIN HFA) 108 (90 BASE) MCG/ACT inhaler   Inhalation   Inhale 2 puffs into the lungs every 6 (six) hours as needed. For shortness of breath   1 Inhaler   1   . ARIPiprazole (ABILIFY) 5 MG tablet   Oral   Take 5 mg by mouth daily. Taking half tablet daily.         . Ascorbic Acid (VITAMIN C) 500 MG tablet   Oral   Take 1,000 mg by mouth daily.          Marland Kitchen  aspirin 81 MG EC tablet   Oral   Take 81 mg by mouth daily.          Marland Kitchen aspirin-acetaminophen-caffeine (EXCEDRIN MIGRAINE) 250-250-65 MG per tablet   Oral   Take 2 tablets by mouth every 6 (six) hours as needed. For headaches         . budesonide-formoterol (SYMBICORT) 160-4.5 MCG/ACT inhaler   Inhalation   Inhale 2 puffs into the lungs 2 (two) times daily.   1 Inhaler   3   . cholecalciferol (VITAMIN D) 1000 UNITS tablet   Oral   Take 1,000 Units by mouth daily.         . diazepam (VALIUM) 5 MG tablet   Oral   Take 5 mg by mouth 2 (two) times daily. scheduled         . divalproex (DEPAKOTE) 500 MG 24 hr tablet   Oral   Take 1,000 mg by mouth at bedtime.          . docusate sodium (COLACE) 100 MG capsule   Oral   Take 100-200 mg by mouth daily. Take 1 tablet in the morning and 2 tablets at  bedtime         . DULoxetine (CYMBALTA) 60 MG capsule   Oral   Take 60 mg by mouth daily.         . fluticasone (FLONASE) 50 MCG/ACT nasal spray      PLACE 2 SPRAYS INTO THE NOSE DAILY.   16 g   6   . losartan-hydrochlorothiazide (HYZAAR) 50-12.5 MG per tablet   Oral   Take 1 tablet by mouth every evening.         . metoprolol succinate (TOPROL-XL) 50 MG 24 hr tablet   Oral   Take 50 mg by mouth daily. Take with or immediately following a meal.         . montelukast (SINGULAIR) 10 MG tablet   Oral   Take 1  tablet (10 mg total) by mouth at bedtime.   30 tablet   3   . nitroGLYCERIN (NITROSTAT) 0.4 MG SL tablet   Sublingual   Place 1 tablet (0.4 mg total) under the tongue every 5 (five) minutes as needed. For chest pain   10 tablet   0   . Omega-3 Fatty Acids (FISH OIL TRIPLE STRENGTH) 1400 MG CAPS   Oral   Take 1 capsule by mouth daily.         Marland Kitchen omeprazole (PRILOSEC) 40 MG capsule   Oral   Take 40 mg by mouth 2 (two) times daily.          . ondansetron (ZOFRAN-ODT) 4 MG disintegrating tablet   Oral   Take 1 tablet (4 mg total) by mouth every 8 (eight) hours as needed for nausea.   20 tablet   0   . oxyCODONE-acetaminophen (PERCOCET) 10-325 MG per tablet   Oral   Take 1-2 tablets by mouth every 6 (six) hours as needed for pain.         Marland Kitchen Phenyleph-CPM-DM-APAP (ALKA-SELTZER PLUS COLD & COUGH) 06-11-08-325 MG CAPS   Oral   Take 2 tablets by mouth daily.          . polyethylene glycol (MIRALAX / GLYCOLAX) packet   Oral   Take 17 g by mouth daily as needed. For constipation         . primidone (MYSOLINE) 250 MG tablet   Oral   Take 1 tablet (250  mg total) by mouth 2 (two) times daily.   60 tablet   6   . simvastatin (ZOCOR) 40 MG tablet   Oral   Take 1 tablet (40 mg total) by mouth every evening.   90 tablet   3   . tiotropium (SPIRIVA HANDIHALER) 18 MCG inhalation capsule   Inhalation   Place 1 capsule (18 mcg total) into inhaler and inhale daily.   30 capsule   6   . Zinc 50 MG TABS   Oral   Take 50 mg by mouth daily.            BP 145/80  Pulse 100  Temp(Src) 97.7 F (36.5 C) (Oral)  Resp 18  SpO2 94%  Physical Exam  Constitutional: He is oriented to person, place, and time. He appears well-developed. He appears distressed.  Painful distress  HENT:  Head: Normocephalic and atraumatic.  Eyes: EOM are normal. Pupils are equal, round, and reactive to light.  Neck: Normal range of motion. Neck supple.  Cardiovascular: Regular rhythm, normal  heart sounds and intact distal pulses.   tachycardia  Pulmonary/Chest: Effort normal. He has wheezes.  B/l rhonchi  Abdominal: Soft. He exhibits no distension. There is tenderness. There is guarding.  Voluntary guarding, tenderness to deep palpation in epigastric region, +bs  Genitourinary: Guaiac positive stool.  Rectal exam; FOBT negative  Musculoskeletal: Normal range of motion. He exhibits tenderness. He exhibits no edema.  Diffuse tenderness to palpation of back  Neurological: He is alert and oriented to person, place, and time.  Skin: Skin is warm and dry. He is not diaphoretic.  + erythema in sacral region  Psychiatric: His behavior is normal. Thought content normal.  tearful    ED Course  Procedures (including critical care time)  Labs Reviewed  CBC WITH DIFFERENTIAL - Abnormal; Notable for the following:    RBC 4.04 (*)    Hemoglobin 11.6 (*)    HCT 33.0 (*)    Platelets 535 (*)    Neutrophils Relative % 79 (*)    Neutro Abs 8.1 (*)    All other components within normal limits  COMPREHENSIVE METABOLIC PANEL - Abnormal; Notable for the following:    Sodium 131 (*)    Chloride 90 (*)    Glucose, Bld 101 (*)    Albumin 3.2 (*)    AST 58 (*)    Alkaline Phosphatase 243 (*)    Total Bilirubin 0.2 (*)    All other components within normal limits  APTT - Abnormal; Notable for the following:    aPTT 49 (*)    All other components within normal limits  LIPASE, BLOOD  PROTIME-INR  URINALYSIS, ROUTINE W REFLEX MICROSCOPIC  OCCULT BLOOD, POC DEVICE  SAMPLE TO BLOOD BANK   No results found.   No diagnosis found.  Date: 07/22/2012  Rate: 94bpm  Rhythm: normal sinus rhythm  QRS Axis: normal  Intervals: normal  ST/T Wave abnormalities: nonspecific T wave changes t wave inversion in aVL and V2  Conduction Disutrbances:none  Narrative Interpretation: 94bpm, NSR, nonspecific t wave changes  Old EKG Reviewed: changes noted 10/02/11: hr 118bpm, sinus tachycardia, t wave  flattening in aVL.  MDM  Mr. Utsey is a 67 year old male with extensive PMH significant for HTN, chronic back pain s/p multiple surgeries on percocet, MI s/p stent, CVA, COPD, and s/p cholecystectomy presenting to the ED with worsening epigastric abdominal pain x 5 days.  FOBT negative.  Abdominal pain differentials are broad  including PUD, gastritis, pancreatitis, constipation, ?malignancy, and bowel obstruction, among others.    -CMET: Na 131, Cl 90, ALP 243, AST 58, with TB 0.2 -CBC; Hb 11.6, Platelets 535 -Lipase 20 -PT 14 INR 1.09 -PTT 49 -CXR -CT abdomen and pelvis with contrast -NS bolus 500cc x1 -morphine 4mg  iv x1 -u/a -IV protonix 40mg  x1  Case discussed with Dr. Ignacia Palma who evaluated the patient with me and will complete the ED patient evaluation and follow up of ordered labs.     Darden Palmer, MD 07/22/12 1630

## 2012-07-22 NOTE — Progress Notes (Signed)
67 yo man has had epigastric pain for 4 days.  Has chronic back pain, for which he takes percocet, which has not helped the abdominal pain.  He has not taken any antacid or other GI medications.  He had called his gastroenterologist, Dr. Leary Roca, who advised him to come to the ED.  He has had prior cholecystectomy.  Exam shows him to be a pleasant elderly man in mild to moderate distress with epigastric pain.  Abdomen shows mild epigastric tenderness.  Lab workup ordered, and IV pain medication and IV Protonix ordered. 6:07 PM CT of abdomen and pelvis shows enlarged stomach filled with contrast material, suggesting gastric outlet obstruction.  Call to Dr. Dulce Sellar, covering for Dr. Ewing Schlein, advised NG tube and admission.  Will contact [facility] to admit pt.

## 2012-07-22 NOTE — ED Notes (Signed)
Phlebotomy called for Blood Bank sample to be drawn, patient placed back in waiting room until lab is ready

## 2012-07-22 NOTE — H&P (Addendum)
[facility] History and Physical  MATTHIEU LOFTUS ZOX:[id number] DOB: [birth year] DOA: 07/22/2012  Referring physician: Dr. Ignacia Palma PCP: Neena Rhymes, MD  Specialists: Gastroenterology  Chief Complaint: abdominal pain  HPI: Malik Zuniga is a 67 y.o. male  Wit extensive PMH including MI s/p stent placement, CVA, HTN, HL, PNA, prostate cancer, COPD, and s/p multiple back surgeries, craniotomy and cholecystectomy presenting to the ED today with complaints of severe epigastric abdominal pain since Sunday. He has had Worsening chronic back pain that he has been taking percocet and has gotten constipated. As a result, on Sunday, he decided to take 4 laxative tablets and 5 glasses of miralax that eventually resulted in one large BM. But since then, he has been having increasing abdominal pain to the point that he could not take it anymore today. In the meantime, he has continued to take his percocet for back pain, including 4 on Tuesday and then 3 tablets today. His last BM yes on Tuesday 07/20/12. He continues to take his Percocet as well as aspirin 81 mg and a combination pill of aspirin acetaminophen and caffeine (Excedrin) He denies any vomiting some nausea he continues to pass gas. He relates and his abdominal pain is not made worse or better by anything, has progressively gotten worse to the point where he has anorexia. He denies any chest pain, cough, fever, diaphoresis, joint swelling, diarrhea vomiting, dysuria or alcohol consumption.   In the ED: CBC was done that showed a white count of 10.3 with a left shift of 8.1, basic metabolic panel shows hyponatremia and hypochloremia compatible with decreased intravascular volume. His hemoglobin has remained stable at 11.6 (compare from her previous hemoglobin on 07/02/2012 of 11.3), his lipase is within normal limits his, his guaiac is negative, his UA does not show any signs of infection, his chest x-ray does not show any infiltrate. CT scan of  the abdomen and pelvis showed Patulous stomach containing a large volume of oral contrast material.   Review of Systems: The patient denies  fever, weight loss,, vision loss, decreased hearing, hoarseness, chest pain, syncope, dyspnea on exertion, peripheral edema, balance deficits, hemoptysis,  melena, hematochezia, severe indigestion/heartburn, hematuria, incontinence, genital sores, muscle weakness, suspicious skin lesions, transient blindness, difficulty walking, depression, unusual weight change, abnormal bleeding, enlarged lymph nodes, angioedema, and breast masses.    Past Medical History  Diagnosis Date  . Myocardial infarction   . Hyperlipidemia   . Depression   . Emphysema of lung   . Tremor   . Headache, chronic daily   . History of prostate cancer   . Cancer 2004    prostate  . On home O2   . Aspiration pneumonia   . Stroke     "mini strokes"; denies residual  . Emphysema   . Neuromuscular disorder 1998    right carpal tunnel release  . Hypertension     [employer]  . Anemia associated with acute blood loss    Past Surgical History  Procedure Laterality Date  . Cholecystectomy  1996  . 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 Jordan Likes  . Vascular stent  2000    Dr. Alanda Amass  . Hand surgery  1989    crushed thumb, Dr. Merlyn Lot  . Ulnar nerve repair  1998    left arm, Dr. Merlyn Lot  . Gastrostomy w/ feeding tube  2008    Dr. Ewing Schlein  . 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 Prostatectomy  04/2001    removal of prostate cancer, Dr. Brunilda Payor  . Coronary angioplasty with stent placement  04/1998  . Carpal tunnel release  1998    right  . Back surgery  08/2004; 02/2005; 04/2006; 06/2007; 7/20101    all by Dr. Jordan Likes  . Spine surgery     Social History:  reports that he has been smoking Cigarettes.  He has a 25 pack-year smoking history. He has never used smokeless tobacco. He reports that he does not drink alcohol or use illicit drugs.   Allergies  Allergen Reactions  .  Lisinopril Swelling    ANGIOEDEMA  . Ambien (Zolpidem) Other (See Comments)    Unknown reaction  . Lamictal (Lamotrigine) Other (See Comments)    Weakness/difficulty swallowing    Family History  Problem Relation Age of Onset  . Heart disease Mother   . Cancer Father     brain cancer and prostate cancer     Prior to Admission medications   Medication Sig Start Date End Date Taking? Authorizing Provider  acetaminophen (TYLENOL) 325 MG tablet Take 650 mg by mouth every 6 (six) hours as needed. For pain   Yes Historical Provider, MD  albuterol (PROVENTIL HFA;VENTOLIN HFA) 108 (90 BASE) MCG/ACT inhaler Inhale 2 puffs into the lungs every 6 (six) hours as needed. For shortness of breath 01/23/12  Yes Sheliah Hatch, MD  ARIPiprazole (ABILIFY) 5 MG tablet Take 5 mg by mouth daily. Taking half tablet daily.   Yes Historical Provider, MD  Ascorbic Acid (VITAMIN C) 500 MG tablet Take 1,000 mg by mouth daily.    Yes Historical Provider, MD  aspirin 81 MG EC tablet Take 81 mg by mouth daily.    Yes Historical Provider, MD  aspirin-acetaminophen-caffeine (EXCEDRIN MIGRAINE) [phone] MG per tablet Take 2 tablets by mouth every 6 (six) hours as needed. For headaches   Yes Historical Provider, MD  budesonide-formoterol (SYMBICORT) 160-4.5 MCG/ACT inhaler Inhale 2 puffs into the lungs 2 (two) times daily. 12/15/11  Yes Sheliah Hatch, MD  cholecalciferol (VITAMIN D) 1000 UNITS tablet Take 1,000 Units by mouth daily.   Yes Historical Provider, MD  diazepam (VALIUM) 5 MG tablet Take 5 mg by mouth 2 (two) times daily. scheduled   Yes Historical Provider, MD  divalproex (DEPAKOTE) 500 MG 24 hr tablet Take 1,000 mg by mouth at bedtime.    Yes Historical Provider, MD  docusate sodium (COLACE) 100 MG capsule Take 100-200 mg by mouth daily. Take 1 tablet in the morning and 2 tablets at  bedtime   Yes Historical Provider, MD  DULoxetine (CYMBALTA) 60 MG capsule Take 60 mg by mouth daily.   Yes Historical  Provider, MD  fluticasone (FLONASE) 50 MCG/ACT nasal spray PLACE 2 SPRAYS INTO THE NOSE DAILY. 07/20/12  Yes Sheliah Hatch, MD  losartan-hydrochlorothiazide (HYZAAR) 50-12.5 MG per tablet Take 1 tablet by mouth every evening.   Yes Historical Provider, MD  metoprolol succinate (TOPROL-XL) 50 MG 24 hr tablet Take 50 mg by mouth daily. Take with or immediately following a meal.   Yes Historical Provider, MD  montelukast (SINGULAIR) 10 MG tablet Take 1 tablet (10 mg total) by mouth at bedtime. 05/06/12  Yes Sheliah Hatch, MD  nitroGLYCERIN (NITROSTAT) 0.4 MG SL tablet Place 1 tablet (0.4 mg total) under the tongue every 5 (five) minutes as needed. For chest pain 05/27/12  Yes Sheliah Hatch, MD  Omega-3 Fatty Acids (FISH OIL TRIPLE STRENGTH) 1400 MG CAPS Take 1 capsule  by mouth daily.   Yes Historical Provider, MD  omeprazole (PRILOSEC) 40 MG capsule Take 40 mg by mouth 2 (two) times daily.    Yes Historical Provider, MD  ondansetron (ZOFRAN-ODT) 4 MG disintegrating tablet Take 1 tablet (4 mg total) by mouth every 8 (eight) hours as needed for nausea. 07/15/12  Yes Sheliah Hatch, MD  oxyCODONE-acetaminophen (PERCOCET) 10-325 MG per tablet Take 1-2 tablets by mouth every 6 (six) hours as needed for pain.   Yes Historical Provider, MD  Phenyleph-CPM-DM-APAP (ALKA-SELTZER PLUS COLD & COUGH) 06-11-08-325 MG CAPS Take 2 tablets by mouth daily.    Yes Historical Provider, MD  polyethylene glycol (MIRALAX / GLYCOLAX) packet Take 17 g by mouth daily as needed. For constipation   Yes Historical Provider, MD  primidone (MYSOLINE) 250 MG tablet Take 1 tablet (250 mg total) by mouth 2 (two) times daily. 01/27/12  Yes Sheliah Hatch, MD  simvastatin (ZOCOR) 40 MG tablet Take 1 tablet (40 mg total) by mouth every evening. 04/22/12  Yes Sheliah Hatch, MD  tiotropium (SPIRIVA HANDIHALER) 18 MCG inhalation capsule Place 1 capsule (18 mcg total) into inhaler and inhale daily. 12/15/11  Yes Sheliah Hatch, MD  Zinc 50 MG TABS Take 50 mg by mouth daily.    Yes Historical Provider, MD   Physical Exam: Filed Vitals:   07/22/12 1129  BP: 145/80  Pulse: 100  Temp: 97.7 F (36.5 C)  TempSrc: Oral  Resp: 18  SpO2: 94%    BP 145/80  Pulse 100  Temp(Src) 97.7 F (36.5 C) (Oral)  Resp 18  SpO2 94%  General Appearance:    Alert, cooperative, no distress, appears stated age  Head:    Normocephalic, without obvious abnormality, atraumatic           Throat:   Lips, mucosa, and tongue are dry   Neck:   Supple, symmetrical, trachea midline, no adenopathy;       thyroid:  No enlargement/tenderness/nodules; no carotid   bruit or JVD  Back:     Symmetric, no curvature, ROM normal, no CVA tenderness  Lungs:     Clear to auscultation bilaterally, respirations unlabored  Chest wall:    No tenderness or deformity  Heart:    Regular rate and rhythm, S1 and S2 normal, no murmur, rub   or gallop  Abdomen:     Soft, epigastric tenderness to light palpation, bowel sounds active all four quadrants,   no masses, no organomegaly        Extremities:   Extremities normal, atraumatic, no cyanosis or edema  Pulses:   2+ and symmetric all extremities  Skin:   Skin color, texture, turgor normal, no rashes or lesions  Lymph nodes:   Cervical, supraclavicular, and axillary nodes normal  Neurologic:   CNII-XII intact. Normal strength, sensation and reflexes      throughout    Labs on Admission:  Basic Metabolic Panel:  Recent Labs Lab 07/22/12 1205  NA 131*  K 4.9  CL 90*  CO2 30  GLUCOSE 101*  BUN 9  CREATININE 0.64  CALCIUM 10.0   Liver Function Tests:  Recent Labs Lab 07/22/12 1205  AST 58*  ALT 45  ALKPHOS 243*  BILITOT 0.2*  PROT 7.7  ALBUMIN 3.2*    Recent Labs Lab 07/22/12 1205  LIPASE 20   No results found for this basename: AMMONIA,  in the last 168 hours CBC:  Recent Labs Lab 07/22/12 1205  WBC 10.3  NEUTROABS 8.1*  HGB 11.6*  HCT 33.0*  MCV 81.7  PLT  535*   Cardiac Enzymes: No results found for this basename: CKTOTAL, CKMB, CKMBINDEX, TROPONINI,  in the last 168 hours  BNP (last 3 results) No results found for this basename: PROBNP,  in the last 8760 hours CBG: No results found for this basename: GLUCAP,  in the last 168 hours  Radiological Exams on Admission: Dg Chest 1 View  07/22/2012   *RADIOLOGY REPORT*  Clinical Data: Abdominal pain.  CHEST - 1 VIEW  Comparison: 07/02/2012.  Findings: Normal sized heart.  Clear lungs.  Stable scoliosis and Harrington rods.  IMPRESSION: No acute abnormality.   Original Report Authenticated By: Beckie Salts, M.D.   Ct Abdomen Pelvis W Contrast  07/22/2012   *RADIOLOGY REPORT*  Clinical Data: Several-day history of abdominal pain  CT ABDOMEN AND PELVIS WITH CONTRAST  Technique:  Multidetector CT imaging of the abdomen and pelvis was performed following the standard protocol during bolus administration of intravenous contrast.  Contrast: OMNIPAQUE IOHEXOL 300 MG/ML SOLN  Comparison: Prior lumbar spine MRI 12/04/2011; remote CT scan of the abdomen 08/30/2003  Findings:  Lower Chest:  Trace right pleural effusion.  Bibasilar subsegmental atelectasis versus chronic scarring.  The visualized heart is within normal limits for size.  No pericardial effusion. Atherosclerotic calcifications noted along the course of the left anterior descending coronary artery.  Unremarkable distal thoracic esophagus.  Abdomen: Patulous stomach containing a large volume of oral contrast material.  Unremarkable appearance of the duodenum, spleen and adrenal glands.  Diffuse fatty atrophy of the pancreas.  Stable hepatic cysts in the dome and adjacent to the fissure for the falciform ligament.  Minimal interval progression of intrahepatic and extrahepatic biliary ductal dilatation.  The common bile duct measures 1 cm in caliber compared to 8 mm in at 2005.  Surgical changes of prior cholecystectomy.  Slow interval enlargement of the  exophytic simple cyst from the lower pole of the right kidney which currently measures 7.4 cm compared to 5.4 cm previously. Additional sub centimeter hypoattenuating lesions in both kidneys are too small for accurate characterization but likely simple cysts.  Symmetric perfusion of the renal parenchyma bilaterally.  Normal-caliber large small bowel throughout the abdomen.  No evidence of obstruction.  Scattered colonic diverticula without active inflammation.  No free fluid or suspicious adenopathy.  Pelvis: The bladder is distended with urine.  Surgical changes of prostatectomy and bilateral pelvic lymph node dissection.  No free fluid or adenopathy. Bilateral fat containing inguinal hernias.  Bones: Extensive postsurgical changes of prior posterior thoracolumbar fusion with bilateral pedicle screw and rod counts construct spanning at least T11-S1.  Interbody grafts are noted at L2-L3, L3-L4, L4-L5 and L5-S1. Kyphotic dextroconvex scoliosis centered at L1-L3.  No definite soft tissue abscess.  Vascular: Diffuse atherosclerotic vascular calcification without aneurysmal dilatation.  IMPRESSION:  1.  No definite acute intra-abdominal or pelvic finding to explain the patient's clinical symptoms.  2.  Trace right pleural effusion with associated subsegmental atelectasis  3.  Atherosclerosis including coronary artery disease  4.  Minimal interval progression of biliary ductal dilatation compared to 2005 which may be related to the prior cholecystectomy. Recommend clinical correlation with serum bilirubin and LFTs.  5.  Enlarging right simple renal cyst now measures up to 7.4 cm in diameter.  6.  Severe kyphotic dextro convex scoliosis status post extensive posterior lumbar interbody fusion.   Original Report Authenticated By: Malachy Moan, M.D.    EKG: Independently  reviewed. Sinus rhythm normal axis, nonspecific T-wave abnormalities.  Assessment/Plan  Abdominal pain, acute, epigastric: - Interestingly enough  did not have any melanotic stools,guaiac was negative. He has been taking aspirin 81 mg plus Excedrin which has a large amount of aspirin in it for his abdominal pain. At this time place an NG tube in place at intermittent suction. As I'm concerned of gastric outlet obstruction most likely due to peptic ulcer disease. We'll place n.p.o. will place 2 large bores IV start him on Protonix IV twice a day. We'll type and screen 2 units of packed red blood cells. He does not show any signs of hemodynamic instability and his hemoglobin is stable compared to previous hemoglobin. This could be falsely elevated at  he seems to be intravascularly depleted. And I'm sure that after hydration his hemoglobin will drop.  - Have already consulted GI for possible endoscopy tomorrow morning. Dr. Dulce Sellar. Check abdominal x-ray tomorrow morning.  - Also contributing to this could be the overuse of narcotics. I have explained to him that we need to reduce the use of narcotics during this hospital stay to see there is some kind of improvement.  Hyponatremia: - This probably secondary to decreased intravascular volume as he has had decreased oral intake even with water. I will go ahead and start him on aggressive IV fluid hydration follow strict I.'s and nose. Check a basic metabolic panel in the morning. We'll check urinary sodium and urinary creatinine to confirm this.  Transaminitis: -  Check a PT and INR and has been taking Percocet Tylenol and Excedrin, all of them which have Tylenol in them, so was the most likely cause of his elevated ALT. - Recheck in the morning.   HYPERLIPIDEMIA: - Hold on statins.  HYPERTENSION: - Blood pressure medication except the Toprol.   Chronic lower back pain: - Stop Percocet, morphine IV every 4 hours. Zofran for nausea.   Code Status: full Family Communication: wife Disposition Plan: inpatient  Time spent: 70 minutes  Marinda Elk [facility] Pager  [phone]  If 7PM-7AM, please contact night-coverage [email or web address] Password [facility] 07/22/2012, 7:17 PM

## 2012-07-22 NOTE — ED Notes (Signed)
Pt c/o abd pain x several days and pain for 3 weeks on sides; pt sts no BM x 5 days and then 8 days before that; pt appears pale

## 2012-07-22 NOTE — ED Provider Notes (Signed)
3:37 PM  Date: 07/22/2012  Rate: 94  Rhythm: normal sinus rhythm  QRS Axis: normal  Intervals: normal  ST/T Wave abnormalities: nonspecific T wave changes  Conduction Disutrbances:none  Narrative Interpretation: Borderline EKG  Old EKG Reviewed: changes noted--rate slower than on tracing done on 10/02/2011.    Carleene Cooper III, MD 07/22/12 1539

## 2012-07-23 ENCOUNTER — Encounter (HOSPITAL_COMMUNITY): Payer: Self-pay | Admitting: *Deleted

## 2012-07-23 ENCOUNTER — Encounter (HOSPITAL_COMMUNITY)
Admission: EM | Disposition: A | Discharge status: Home / Self Care | Payer: Self-pay | Source: Home / Self Care | Attending: Internal Medicine

## 2012-07-23 ENCOUNTER — Inpatient Hospital Stay (HOSPITAL_COMMUNITY): Payer: Medicare Other

## 2012-07-23 DIAGNOSIS — R633 Feeding difficulties: Secondary | ICD-10-CM | POA: Diagnosis not present

## 2012-07-23 DIAGNOSIS — R7402 Elevation of levels of lactic acid dehydrogenase (LDH): Secondary | ICD-10-CM

## 2012-07-23 DIAGNOSIS — R1013 Epigastric pain: Secondary | ICD-10-CM | POA: Diagnosis not present

## 2012-07-23 DIAGNOSIS — M545 Low back pain, unspecified: Secondary | ICD-10-CM

## 2012-07-23 DIAGNOSIS — R110 Nausea: Secondary | ICD-10-CM | POA: Diagnosis not present

## 2012-07-23 DIAGNOSIS — E785 Hyperlipidemia, unspecified: Secondary | ICD-10-CM

## 2012-07-23 DIAGNOSIS — R933 Abnormal findings on diagnostic imaging of other parts of digestive tract: Secondary | ICD-10-CM | POA: Diagnosis not present

## 2012-07-23 DIAGNOSIS — Z4682 Encounter for fitting and adjustment of non-vascular catheter: Secondary | ICD-10-CM | POA: Diagnosis not present

## 2012-07-23 DIAGNOSIS — R112 Nausea with vomiting, unspecified: Secondary | ICD-10-CM | POA: Diagnosis not present

## 2012-07-23 DIAGNOSIS — R109 Unspecified abdominal pain: Secondary | ICD-10-CM | POA: Diagnosis not present

## 2012-07-23 DIAGNOSIS — G8929 Other chronic pain: Secondary | ICD-10-CM

## 2012-07-23 DIAGNOSIS — E871 Hypo-osmolality and hyponatremia: Secondary | ICD-10-CM | POA: Diagnosis not present

## 2012-07-23 DIAGNOSIS — K310 Acute dilatation of stomach: Secondary | ICD-10-CM | POA: Diagnosis not present

## 2012-07-23 DIAGNOSIS — R141 Gas pain: Secondary | ICD-10-CM | POA: Diagnosis not present

## 2012-07-23 DIAGNOSIS — K5902 Outlet dysfunction constipation: Secondary | ICD-10-CM | POA: Diagnosis not present

## 2012-07-23 HISTORY — PX: ESOPHAGOGASTRODUODENOSCOPY: SHX5428

## 2012-07-23 LAB — COMPREHENSIVE METABOLIC PANEL
ALT: 28 U/L (ref 0–53)
AST: 26 U/L (ref 0–37)
CO2: 23 mEq/L (ref 19–32)
Calcium: 9.2 mg/dL (ref 8.4–10.5)
Creatinine, Ser: 0.5 mg/dL (ref 0.50–1.35)
GFR calc Af Amer: >90 mL/min (ref >90)
GFR calc non Af Amer: >90 mL/min (ref >90)
Sodium: 130 mEq/L — ABNORMAL LOW (ref 135–145)
Total Protein: 7 g/dL (ref 6.0–8.3)

## 2012-07-23 LAB — CBC
HCT: 31.9 % — ABNORMAL LOW (ref 39.0–52.0)
Hemoglobin: 11 g/dL — ABNORMAL LOW (ref 13.0–17.0)
RBC: 3.93 MIL/uL — ABNORMAL LOW (ref 4.22–5.81)

## 2012-07-23 LAB — PROTIME-INR
INR: 1.09 (ref 0.00–1.49)
Prothrombin Time: 14 seconds (ref 11.6–15.2)

## 2012-07-23 LAB — TSH: TSH: 0.639 u[IU]/mL (ref 0.350–4.500)

## 2012-07-23 SURGERY — EGD (ESOPHAGOGASTRODUODENOSCOPY)
Anesthesia: Moderate Sedation

## 2012-07-23 MED ORDER — MIDAZOLAM HCL 10 MG/2ML IJ SOLN
INTRAMUSCULAR | Status: DC | PRN
  Administered 2012-07-23: 1 mg via INTRAVENOUS
  Administered 2012-07-23: 2 mg via INTRAVENOUS
  Administered 2012-07-23: 1 mg via INTRAVENOUS
  Administered 2012-07-23: 2 mg via INTRAVENOUS

## 2012-07-23 MED ORDER — FENTANYL CITRATE 0.05 MG/ML IJ SOLN
INTRAMUSCULAR | Status: DC | PRN
  Administered 2012-07-23 (×2): 25 ug via INTRAVENOUS

## 2012-07-23 MED ORDER — LOSARTAN POTASSIUM-HCTZ 50-12.5 MG PO TABS: 1.0000 | ORAL_TABLET | Freq: Every evening | ORAL | Status: DC

## 2012-07-23 MED ORDER — SODIUM CHLORIDE 0.9 % IV SOLN
INTRAVENOUS | Status: DC
  Administered 2012-07-23: 15:00:00 via INTRAVENOUS

## 2012-07-23 MED ORDER — LOSARTAN POTASSIUM 50 MG PO TABS
50.0000 mg | ORAL_TABLET | Freq: Every day | ORAL | Status: DC
  Administered 2012-07-23 – 2012-07-24 (×2): 50 mg via ORAL
  Filled 2012-07-23 (×2): qty 1

## 2012-07-23 MED ORDER — MIDAZOLAM HCL 5 MG/ML IJ SOLN
INTRAMUSCULAR | Status: AC
  Filled 2012-07-23: qty 2

## 2012-07-23 MED ORDER — PRIMIDONE 250 MG PO TABS
250.0000 mg | ORAL_TABLET | Freq: Two times a day (BID) | ORAL | Status: DC
  Administered 2012-07-23 – 2012-07-24 (×2): 250 mg via ORAL
  Filled 2012-07-23 (×3): qty 1

## 2012-07-23 MED ORDER — DIPHENHYDRAMINE HCL 50 MG/ML IJ SOLN
INTRAMUSCULAR | Status: AC
  Filled 2012-07-23: qty 1

## 2012-07-23 MED ORDER — DIPHENHYDRAMINE HCL 25 MG PO CAPS
25.0000 mg | ORAL_CAPSULE | Freq: Every evening | ORAL | Status: DC | PRN
  Administered 2012-07-23: 25 mg via ORAL
  Filled 2012-07-23: qty 1

## 2012-07-23 MED ORDER — BUTAMBEN-TETRACAINE-BENZOCAINE 2-2-14 % EX AERO
INHALATION_SPRAY | CUTANEOUS | Status: DC | PRN
  Administered 2012-07-23: 1 via TOPICAL

## 2012-07-23 MED ORDER — FENTANYL CITRATE 0.05 MG/ML IJ SOLN
INTRAMUSCULAR | Status: AC
  Filled 2012-07-23: qty 2

## 2012-07-23 MED ORDER — PANTOPRAZOLE SODIUM 40 MG PO TBEC
40.0000 mg | DELAYED_RELEASE_TABLET | Freq: Every day | ORAL | Status: DC
  Administered 2012-07-23 – 2012-07-24 (×2): 40 mg via ORAL
  Filled 2012-07-23 (×2): qty 1

## 2012-07-23 MED ORDER — HYDROCHLOROTHIAZIDE 12.5 MG PO CAPS
12.5000 mg | ORAL_CAPSULE | Freq: Every day | ORAL | Status: DC
  Administered 2012-07-23: 12.5 mg via ORAL
  Filled 2012-07-23 (×2): qty 1

## 2012-07-23 MED ORDER — MONTELUKAST SODIUM 10 MG PO TABS
10.0000 mg | ORAL_TABLET | Freq: Every day | ORAL | Status: DC
  Administered 2012-07-23: 10 mg via ORAL
  Filled 2012-07-23 (×2): qty 1

## 2012-07-23 NOTE — Consult Note (Signed)
Referring Provider: Dr. Joana Reamer Primary Care Physician:  Neena Rhymes, MD Primary Gastroenterologist:  Dr. Ewing Schlein  Reason for Consultation:  Epigastric pain, ?GOO  HPI: Malik Zuniga is a 67 y.o. male being seen at the request of the hospitalist service because of severe upper abdominal pain and nausea.   The patient has been followed for years by my partner, Dr. Vida Rigger, because of general failure to thrive. He has an ill-defined neurologic syndrome which has left him with chronic pain and overall in mobility. In fact, due to his failure to thrive, the patient actually underwent placement of a PEG tube for nutrition support about 6 years ago, but it was subsequently removed.   His general nutritional status and food tolerance had been stable until several weeks ago, when he developed significantly intensified back pain, resulting in several trips to the emergency room, and this was associated with an increase in his pain medication from Vicodin to Percocet. After that, the patient had diminished stool output (normally he has a bowel movement every other day with the help of stool softeners and MiraLAX) and ultimately developed nausea and severe upper, pain and tenderness. His last significant food intake was about 4 days prior to admission.   He presented to the emergency room yesterday where an abdominal CT showed a dilated stomach with a large amount of contrast material. The patient is on aspirin products as an outpatient. In the hospital, an NG tube was placed which put out about 1400 mL of dark, thick, almost feculent appearing material.  He now feels "about 70% better."  Past Medical History  Diagnosis Date  . Myocardial infarction   . Hyperlipidemia   . Depression   . Emphysema of lung   . Tremor   . Headache, chronic daily   . History of prostate cancer   . Cancer 2004    prostate  . On home O2   . Aspiration pneumonia   . Stroke     "mini strokes"; denies residual  .  Emphysema   . Neuromuscular disorder 1998    right carpal tunnel release  . Hypertension     [employer]  . Anemia associated with acute blood loss     Past Surgical History  Procedure Laterality Date  . Cholecystectomy  1996  . 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 Jordan Likes  . Vascular stent  2000    Dr. Alanda Amass  . Hand surgery  1989    crushed thumb, Dr. Merlyn Lot  . Ulnar nerve repair  1998    left arm, Dr. Merlyn Lot  . Gastrostomy w/ feeding tube  2008    Dr. Ewing Schlein  . 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 Prostatectomy  04/2001    removal of prostate cancer, Dr. Brunilda Payor  . Coronary angioplasty with stent placement  04/1998  . Carpal tunnel release  1998    right  . Back surgery  08/2004; 02/2005; 04/2006; 06/2007; 7/20101    all by Dr. Jordan Likes  . Spine surgery      Prior to Admission medications   Medication Sig Start Date End Date Taking? Authorizing Provider  acetaminophen (TYLENOL) 325 MG tablet Take 650 mg by mouth every 6 (six) hours as needed. For pain   Yes Historical Provider, MD  albuterol (PROVENTIL HFA;VENTOLIN HFA) 108 (90 BASE) MCG/ACT inhaler Inhale 2 puffs into the lungs every 6 (six) hours as needed. For shortness of breath 01/23/12  Yes  Sheliah Hatch, MD  ARIPiprazole (ABILIFY) 5 MG tablet Take 5 mg by mouth daily. Taking half tablet daily.   Yes Historical Provider, MD  Ascorbic Acid (VITAMIN C) 500 MG tablet Take 1,000 mg by mouth daily.    Yes Historical Provider, MD  aspirin 81 MG EC tablet Take 81 mg by mouth daily.    Yes Historical Provider, MD  aspirin-acetaminophen-caffeine (EXCEDRIN MIGRAINE) [phone] MG per tablet Take 2 tablets by mouth every 6 (six) hours as needed. For headaches   Yes Historical Provider, MD  budesonide-formoterol (SYMBICORT) 160-4.5 MCG/ACT inhaler Inhale 2 puffs into the lungs 2 (two) times daily. 12/15/11  Yes Sheliah Hatch, MD  cholecalciferol (VITAMIN D) 1000 UNITS tablet Take 1,000 Units by mouth daily.   Yes Historical  Provider, MD  diazepam (VALIUM) 5 MG tablet Take 5 mg by mouth 2 (two) times daily. scheduled   Yes Historical Provider, MD  divalproex (DEPAKOTE) 500 MG 24 hr tablet Take 1,000 mg by mouth at bedtime.    Yes Historical Provider, MD  docusate sodium (COLACE) 100 MG capsule Take 100-200 mg by mouth daily. Take 1 tablet in the morning and 2 tablets at  bedtime   Yes Historical Provider, MD  DULoxetine (CYMBALTA) 60 MG capsule Take 60 mg by mouth daily.   Yes Historical Provider, MD  fluticasone (FLONASE) 50 MCG/ACT nasal spray PLACE 2 SPRAYS INTO THE NOSE DAILY. 07/20/12  Yes Sheliah Hatch, MD  losartan-hydrochlorothiazide (HYZAAR) 50-12.5 MG per tablet Take 1 tablet by mouth every evening.   Yes Historical Provider, MD  metoprolol succinate (TOPROL-XL) 50 MG 24 hr tablet Take 50 mg by mouth daily. Take with or immediately following a meal.   Yes Historical Provider, MD  montelukast (SINGULAIR) 10 MG tablet Take 1 tablet (10 mg total) by mouth at bedtime. 05/06/12  Yes Sheliah Hatch, MD  nitroGLYCERIN (NITROSTAT) 0.4 MG SL tablet Place 1 tablet (0.4 mg total) under the tongue every 5 (five) minutes as needed. For chest pain 05/27/12  Yes Sheliah Hatch, MD  Omega-3 Fatty Acids (FISH OIL TRIPLE STRENGTH) 1400 MG CAPS Take 1 capsule by mouth daily.   Yes Historical Provider, MD  omeprazole (PRILOSEC) 40 MG capsule Take 40 mg by mouth 2 (two) times daily.    Yes Historical Provider, MD  ondansetron (ZOFRAN-ODT) 4 MG disintegrating tablet Take 1 tablet (4 mg total) by mouth every 8 (eight) hours as needed for nausea. 07/15/12  Yes Sheliah Hatch, MD  oxyCODONE-acetaminophen (PERCOCET) 10-325 MG per tablet Take 1-2 tablets by mouth every 6 (six) hours as needed for pain.   Yes Historical Provider, MD  Phenyleph-CPM-DM-APAP (ALKA-SELTZER PLUS COLD & COUGH) 06-11-08-325 MG CAPS Take 2 tablets by mouth daily.    Yes Historical Provider, MD  polyethylene glycol (MIRALAX / GLYCOLAX) packet Take 17 g by  mouth daily as needed. For constipation   Yes Historical Provider, MD  primidone (MYSOLINE) 250 MG tablet Take 1 tablet (250 mg total) by mouth 2 (two) times daily. 01/27/12  Yes Sheliah Hatch, MD  simvastatin (ZOCOR) 40 MG tablet Take 1 tablet (40 mg total) by mouth every evening. 04/22/12  Yes Sheliah Hatch, MD  tiotropium (SPIRIVA HANDIHALER) 18 MCG inhalation capsule Place 1 capsule (18 mcg total) into inhaler and inhale daily. 12/15/11  Yes Sheliah Hatch, MD  Zinc 50 MG TABS Take 50 mg by mouth daily.    Yes Historical Provider, MD    Current Facility-Administered Medications  Medication Dose Route Frequency Provider Last Rate Last Dose  . 0.9 %  sodium chloride infusion   Intravenous Continuous Marinda Elk, MD 125 mL/hr at 07/23/12 0539    . 0.9 %  sodium chloride infusion   Intravenous Continuous Petra Kuba, MD      . [facility] HOLD] ARIPiprazole (ABILIFY) tablet 5 mg  5 mg Oral Daily Marinda Elk, MD   5 mg at 07/23/12 1024  . [facility] HOLD] aspirin EC tablet 81 mg  81 mg Oral Daily Marlane Mingle Hiatt, RPH   81 mg at 07/23/12 1024  . [MAR HOLD] budesonide-formoterol (SYMBICORT) 160-4.5 MCG/ACT inhaler 2 puff  2 puff Inhalation BID Marinda Elk, MD   2 puff at 07/23/12 0748  . [MAR HOLD] diazepam (VALIUM) tablet 5 mg  5 mg Oral BID Marinda Elk, MD   5 mg at 07/23/12 1024  . [facility] HOLD] divalproex (DEPAKOTE ER) 24 hr tablet 1,000 mg  1,000 mg Oral QHS Marinda Elk, MD   1,000 mg at 07/22/12 2241  . [MAR HOLD] DULoxetine (CYMBALTA) DR capsule 60 mg  60 mg Oral Daily Marinda Elk, MD   60 mg at 07/23/12 1024  . [facility] HOLD] metoprolol succinate (TOPROL-XL) 24 hr tablet 50 mg  50 mg Oral Daily Marinda Elk, MD   50 mg at 07/23/12 1024  . [facility] HOLD] morphine 2 MG/ML injection 2 mg  2 mg Intravenous Q4H PRN Marinda Elk, MD   2 mg at 07/23/12 0754  . [MAR HOLD] ondansetron (ZOFRAN) injection 4 mg  4 mg Intravenous Once Carleene Cooper III, MD       . [facility] HOLD] ondansetron [facility]) tablet 4 mg  4 mg Oral Q6H PRN Marinda Elk, MD       Or  . [facility] HOLD] ondansetron [facility]) injection 4 mg  4 mg Intravenous Q6H PRN Marinda Elk, MD   4 mg at 07/23/12 0328  . [MAR HOLD] pantoprazole (PROTONIX) injection 40 mg  40 mg Intravenous Q12H Marinda Elk, MD   40 mg at 07/23/12 1024  . [MAR HOLD] simvastatin (ZOCOR) tablet 40 mg  40 mg Oral q1800 Rolan Lipa, NP   40 mg at 07/22/12 2241  . [facility] HOLD] sodium chloride 0.9 % bolus 500 mL  500 mL Intravenous Once Marinda Elk, MD      . [facility] HOLD] tiotropium [facility]) inhalation capsule 18 mcg  18 mcg Inhalation Daily Marinda Elk, MD   18 mcg at 07/23/12 0749    Allergies as of 07/22/2012 - Review Complete 07/22/2012  Allergen Reaction Noted  . Lisinopril Swelling 10/31/2010  . Ambien (zolpidem) Other (See Comments) 07/03/2012  . Lamictal (lamotrigine) Other (See Comments) 05/22/2011    Family History  Problem Relation Age of Onset  . Heart disease Mother   . Cancer Father     brain cancer and prostate cancer     History   Social History  . Marital Status: Married    Spouse Name: N/A    Number of Children: N/A  . Years of Education: N/A   Occupational History  . Not on file.   Social History Main Topics  . Smoking status: Current Every Day Smoker -- 0.50 packs/day for 50 years    Types: Cigarettes    Last Attempt to Quit: 12/15/2010  . Smokeless tobacco: Never Used     Comment: pt started back may 2013.pt  . Alcohol Use: No  .  Drug Use: No  . Sexually Active: Yes   Other Topics Concern  . Not on file   Social History Narrative  . No narrative on file    Review of Systems: Tendency for chronic constipation as per history of present illness. No significant recent weight loss. It sounds as though the current symptoms had their onset several weeks ago, rather than being part of a chronic or progressive decline  Physical  Exam: Vital signs in last 24 hours: Temp:  [97.3 F (36.3 C)-97.9 F (36.6 C)] 97.9 F (36.6 C) (06/13 0521) Pulse Rate:  [111-118] 118 (06/13 0521) Resp:  [18-20] 18 (06/13 0521) BP: (131-180)/(62-90) 131/62 mmHg (06/13 0521) SpO2:  [93 %-95 %] 94 % (06/13 0750) Weight:  [70.9 kg (156 lb 4.9 oz)] 70.9 kg (156 lb 4.9 oz) (06/13 0521) Last BM Date: 07/22/12 General:   Alert,  Well-developed, well-nourished, pleasant and cooperative in NAD Head:  Normocephalic and atraumatic. Eyes:  Sclera clear, no icterus.   Lungs:  Clear throughout to auscultation.   No wheezes, crackles, or rhonchi. No evident respiratory distress. Heart:   Regular rate and rhythm; no murmurs, clicks, rubs,  or gallops. Abdomen:  Soft, nontender, nontympanitic, and nondistended. No masses, hepatosplenomegaly or ventral hernias noted. Normal bowel sounds, without bruits, guarding, or rebound.  No succussion splash present. NG tube is still in place, but does not appear to be actively draining any fluid at this time. Rectal:  Empty rectal ampulla, no masses. Specifically, no fecal impaction or   Neurologic:  Alert and coherent;  extremity movements are somewhat spastic Psych:   Alert and cooperative. Normal mood and affect.  Intake/Output from previous day: 06/12 0701 - 06/13 0700 In: 1014.6 [I.V.:1014.6] Out: 1400 [Emesis/NG output:1400] Intake/Output this shift:    Lab Results:  Recent Labs  07/22/12 1205 07/23/12 0510  WBC 10.3 9.7  HGB 11.6* 11.0*  HCT 33.0* 31.9*  PLT 535* 546*   BMET  Recent Labs  07/22/12 1205 07/23/12 0510  NA 131* 130*  K 4.9 4.0  CL 90* 92*  CO2 30 23  GLUCOSE 101* 85  BUN 9 6  CREATININE 0.64 0.50  CALCIUM 10.0 9.2   LFT  Recent Labs  07/23/12 0510  PROT 7.0  ALBUMIN 2.8*  AST 26  ALT 28  ALKPHOS 202*  BILITOT 0.2*   PT/INR  Recent Labs  07/22/12 1205 07/23/12 0510  LABPROT 14.0 14.0  INR 1.09 1.09    C-Diff  Studies/Results: Dg Chest 1  View  07/22/2012   *RADIOLOGY REPORT*  Clinical Data: Abdominal pain.  CHEST - 1 VIEW  Comparison: 07/02/2012.  Findings: Normal sized heart.  Clear lungs.  Stable scoliosis and Harrington rods.  IMPRESSION: No acute abnormality.   Original Report Authenticated By: Beckie Salts, M.D.   Abd 1 View (kub)  07/23/2012   *RADIOLOGY REPORT*  Clinical Data: Gastric and abdominal distension; no bowel activity, dark bloody material from NG tube  ABDOMEN - 1 VIEW  Comparison: CT abdomen pelvis - 07/30/2012  Findings:  An enteric tube tip and side port overlie the expected location of the gastric fundus.  There is a paucity of bowel gas without definite evidence of obstruction.  No pneumoperitoneum, pneumatosis or portal venous gas.  Post cholecystectomy.  Extensive vascular calcifications.  Multiple surgical clips overlie the lower pelvis. Post long segment thoracolumbar spine paraspinal fusion.  Linear support apparatus overlies the midline of the left mid hemiabdomen and is favored to be external to the  patient.  IMPRESSION: 1.  Paucity of bowel gas without evidence of obstruction. 2.  Enteric tube tip and side port project over the gastric fundus.   Original Report Authenticated By: Tacey Ruiz, MD   Ct Abdomen Pelvis W Contrast  07/22/2012   *RADIOLOGY REPORT*  Clinical Data: Several-day history of abdominal pain  CT ABDOMEN AND PELVIS WITH CONTRAST  Technique:  Multidetector CT imaging of the abdomen and pelvis was performed following the standard protocol during bolus administration of intravenous contrast.  Contrast: OMNIPAQUE IOHEXOL 300 MG/ML SOLN  Comparison: Prior lumbar spine MRI 12/04/2011; remote CT scan of the abdomen 08/30/2003  Findings:  Lower Chest:  Trace right pleural effusion.  Bibasilar subsegmental atelectasis versus chronic scarring.  The visualized heart is within normal limits for size.  No pericardial effusion. Atherosclerotic calcifications noted along the course of the left anterior  descending coronary artery.  Unremarkable distal thoracic esophagus.  Abdomen: Patulous stomach containing a large volume of oral contrast material.  Unremarkable appearance of the duodenum, spleen and adrenal glands.  Diffuse fatty atrophy of the pancreas.  Stable hepatic cysts in the dome and adjacent to the fissure for the falciform ligament.  Minimal interval progression of intrahepatic and extrahepatic biliary ductal dilatation.  The common bile duct measures 1 cm in caliber compared to 8 mm in at 2005.  Surgical changes of prior cholecystectomy.  Slow interval enlargement of the exophytic simple cyst from the lower pole of the right kidney which currently measures 7.4 cm compared to 5.4 cm previously. Additional sub centimeter hypoattenuating lesions in both kidneys are too small for accurate characterization but likely simple cysts.  Symmetric perfusion of the renal parenchyma bilaterally.  Normal-caliber large small bowel throughout the abdomen.  No evidence of obstruction.  Scattered colonic diverticula without active inflammation.  No free fluid or suspicious adenopathy.  Pelvis: The bladder is distended with urine.  Surgical changes of prostatectomy and bilateral pelvic lymph node dissection.  No free fluid or adenopathy. Bilateral fat containing inguinal hernias.  Bones: Extensive postsurgical changes of prior posterior thoracolumbar fusion with bilateral pedicle screw and rod counts construct spanning at least T11-S1.  Interbody grafts are noted at L2-L3, L3-L4, L4-L5 and L5-S1. Kyphotic dextroconvex scoliosis centered at L1-L3.  No definite soft tissue abscess.  Vascular: Diffuse atherosclerotic vascular calcification without aneurysmal dilatation.  IMPRESSION:  1.  No definite acute intra-abdominal or pelvic finding to explain the patient's clinical symptoms.  2.  Trace right pleural effusion with associated subsegmental atelectasis  3.  Atherosclerosis including coronary artery disease  4.  Minimal  interval progression of biliary ductal dilatation compared to 2005 which may be related to the prior cholecystectomy. Recommend clinical correlation with serum bilirubin and LFTs.  5.  Enlarging right simple renal cyst now measures up to 7.4 cm in diameter.  6.  Severe kyphotic dextro convex scoliosis status post extensive posterior lumbar interbody fusion.   Original Report Authenticated By: Malachy Moan, M.D.    Impression: It is unclear whether this patient has an anatomic or functional impairment of gastric emptying, or for that matter, possibly a more distal small bowel obstruction, the latter of which is not supported by the CT scan. 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 However, since he is on aspirin, it is certainly conceivable that he has pyloric channel ulceration, edema, and stenosis.  Plan: Proceed to endoscopic evaluation this afternoon. I've discuss this  idea with patient and his wife who are agreeable to proceed. They are familiar with the procedure from when he had his PEG tube placed 6 years ago.  Assuming that no anatomic abnormalities are found on his endoscopy, I feel it would probably be suitable for the patient's NG tube, out, and for him to be started on frequent small aliquots of clear liquids.  We appreciate the opportunity to see this patient in consultation with you.   LOS: 1 day   Anterrio Mccleery V  07/23/2012, 12:41 PM

## 2012-07-23 NOTE — Evaluation (Signed)
Physical Therapy Evaluation Patient Details Name: Malik Zuniga MRN: [id number] DOB: [birth year] Today's Date: 07/23/2012 Time: 8469-6295 PT Time Calculation (min): 20 min  PT Assessment / Plan / Recommendation Clinical Impression  Pt is a 67 y.o. adm to [facility] due to abdominal pain. Pt presents with complex medical history including but not limited to multiple back surgeries, COPD, TBI. Pt presents with mild balance deficits and generalized deconditioning. Pt to benefit from skilled PT to maximize functional mobility and ensure safe transition home. Encouraged pt to amb around unit with nursing. Discussed OPPT upon acute D/C for more extensive balance activiites.     PT Assessment  Patient needs continued PT services    Follow Up Recommendations  Supervision - Intermittent;Supervision for mobility/OOB    Does the patient have the potential to tolerate intense rehabilitation      Barriers to Discharge None      Equipment Recommendations  Hospital bed;Other (comment) (pt has had one before )    Recommendations for Other Services     Frequency Min 3X/week    Precautions / Restrictions Precautions Precautions: None Restrictions Weight Bearing Restrictions: No   Pertinent Vitals/Pain 5/10 in abdomen       Mobility  Bed Mobility Bed Mobility: Supine to Sit;Sitting - Scoot to Edge of Bed;Sit to Supine Supine to Sit: 5: Supervision;HOB elevated Details for Bed Mobility Assistance: requires HOB elevated and increased time due to pain in back Transfers Transfers: Sit to Stand;Stand to Sit Sit to Stand: 5: Supervision;From bed Stand to Sit: 5: Supervision;To chair/3-in-1;With armrests Details for Transfer Assistance: verbal cues for hand placement and safety; pt tends to pull up on walker for sit to stand and does not reach back prior to sitting; decreased safety awareness Ambulation/Gait Ambulation/Gait Assistance: 5: Supervision Ambulation Distance (Feet): 60 Feet Assistive  device: Rolling walker Ambulation/Gait Assistance Details: pt c/o fatigue with amb; requires 1 standing rest break; verbal cues for safety with RW, tends to push RW anteriorly and keep trunk flexed; encouraged upright posture and to increase gaze ahead vs at ground Gait Pattern: Step-through pattern;Trunk flexed;Wide base of support Gait velocity: decreased Stairs: No Wheelchair Mobility Wheelchair Mobility: No    Exercises     PT Diagnosis: Difficulty walking;Generalized weakness  PT Problem List: Decreased activity tolerance;Decreased balance;Decreased safety awareness;Decreased mobility;Pain PT Treatment Interventions: DME instruction;Gait training;Stair training;Functional mobility training;Therapeutic activities;Therapeutic exercise;Balance training;Neuromuscular re-education;Patient/family education   PT Goals Acute Rehab PT Goals PT Goal Formulation: With patient Time For Goal Achievement: 07/30/12 Potential to Achieve Goals: Good  Visit Information  Last PT Received On: 07/23/12 Assistance Needed: +1    Subjective Data  Subjective: pt lying supine with wife present; agreeable to therapy; pt has been recently on caseload for previous back surgery  Patient Stated Goal: home with wife and no pain in stomach   Prior Functioning  Home Living Lives With: Spouse Available Help at Discharge: Available PRN/intermittently Type of Home: House Home Access: Stairs to enter [employer] of Steps: 6 Entrance Stairs-Rails: Right Home Layout: One level Bathroom Shower/Tub: Forensic scientist: Standard Bathroom Accessibility: Yes How Accessible: Accessible via walker Home Adaptive Equipment: Straight cane;Tub transfer bench;Walker - rolling;Walker - standard;Walker - four wheeled Prior Function Level of Independence: Independent with assistive device(s) (Walks with SPC) Able to Take Stairs?: Yes Driving: Yes Vocation:  Retired Musician: Expressive difficulties (Pt speaks low volume with mumbled speech - pt baseline)    Cognition  Cognition Arousal/Alertness: Awake/alert Behavior During Therapy: WFL for tasks assessed/performed  Overall Cognitive Status: Within Functional Limits for tasks assessed    Extremity/Trunk Assessment Right Lower Extremity Assessment RLE ROM/Strength/Tone: WFL for tasks assessed RLE Sensation: WFL - Light Touch Left Lower Extremity Assessment LLE ROM/Strength/Tone: WFL for tasks assessed LLE Sensation: WFL - Light Touch Trunk Assessment Trunk Assessment: Kyphotic   Balance Balance Balance Assessed: Yes Static Standing Balance Static Standing - Balance Support: No upper extremity supported;During functional activity Static Standing - Level of Assistance: 4: Min assist [place]omberg - Eyes Opened:  (30 sec; at supervision) Rhomberg - Eyes Closed:  (5 sec; min guard required to steady )  End of Session PT - End of Session Equipment Utilized During Treatment: Gait belt Activity Tolerance: Patient limited by fatigue Patient left: in chair;with call bell/phone within reach;with family/visitor present Nurse Communication: Mobility status  GP     Donell Sievert, [place] [phone] 07/23/2012, 3:20 PM

## 2012-07-23 NOTE — Progress Notes (Signed)
[facility] PROGRESS NOTE  Malik Zuniga FAO:[id number] DOB: 02-May-1945 DOA: 07/22/2012 PCP: Neena Rhymes, MD  Assessment/Plan: Principal Problem:   Nausea with vomiting Active Problems:   Abdominal pain, acute, epigastric   HYPERTENSION   Hyponatremia   HYPERLIPIDEMIA   BIPOLAR DISORDER UNSPECIFIED   Chronic lower back pain   Transaminitis   Unspecified constipation  Improving. Advance diet as tolerated than home. We'll resume some of his outpatient medications at this time. Code Status: full Family Communication: none today Disposition Plan: home   Consultants:  [facility]  Procedures:  EGD  Antibiotics:    HPI/Subjective: Feels a little better. Tolerating sips of clears. Abdominal pain improved. Patient had been on Vicodin and was switched to Percocet which when the GI symptoms started  Objective: Filed Vitals:   07/23/12 1350 07/23/12 1400 07/23/12 1410 07/23/12 1437  BP: 135/83 110/77 133/84 135/72  Pulse:    102  Temp:    98.4 F (36.9 C)  TempSrc:    Axillary  Resp: 20 21 20 18   Height:      Weight:      SpO2: 92% 93% 95% 96%    Intake/Output Summary (Last 24 hours) at 07/23/12 1635 Last data filed at 07/23/12 0541  Gross per 24 hour  Intake 1014.58 ml  Output   1400 ml  Net -385.42 ml   Filed Weights   07/22/12 2100 07/23/12 0521  Weight: 70.9 kg (156 lb 4.9 oz) 70.9 kg (156 lb 4.9 oz)    Exam:   General:  Comfortable. Alert and oriented.  HEENT: Moist mucous membranes  Cardiovascular: Regular rate rhythm without murmurs gallops rubs  Respiratory: Clear to auscultation bilaterally without wheeze rhonchi or rales  Abdomen: Soft nontender nondistended  Extremities: No clubbing cyanosis or edema  Data Reviewed: Basic Metabolic Panel:  Recent Labs Lab 07/22/12 1205 07/23/12 0510  NA 131* 130*  K 4.9 4.0  CL 90* 92*  CO2 30 23  GLUCOSE 101* 85  BUN 9 6  CREATININE 0.64 0.50  CALCIUM 10.0 9.2   Liver Function  Tests:  Recent Labs Lab 07/22/12 1205 07/23/12 0510  AST 58* 26  ALT 45 28  ALKPHOS 243* 202*  BILITOT 0.2* 0.2*  PROT 7.7 7.0  ALBUMIN 3.2* 2.8*    Recent Labs Lab 07/22/12 1205  LIPASE 20   No results found for this basename: AMMONIA,  in the last 168 hours CBC:  Recent Labs Lab 07/22/12 1205 07/23/12 0510  WBC 10.3 9.7  NEUTROABS 8.1*  --   HGB 11.6* 11.0*  HCT 33.0* 31.9*  MCV 81.7 81.2  PLT 535* 546*   Cardiac Enzymes: No results found for this basename: CKTOTAL, CKMB, CKMBINDEX, TROPONINI,  in the last 168 hours BNP (last 3 results) No results found for this basename: PROBNP,  in the last 8760 hours CBG: No results found for this basename: GLUCAP,  in the last 168 hours  No results found for this or any previous visit (from the past 240 hour(s)).   Studies: Dg Chest 1 View  07/22/2012   *RADIOLOGY REPORT*  Clinical Data: Abdominal pain.  CHEST - 1 VIEW  Comparison: 07/02/2012.  Findings: Normal sized heart.  Clear lungs.  Stable scoliosis and Harrington rods.  IMPRESSION: No acute abnormality.   Original Report Authenticated By: Beckie Salts, M.D.   Abd 1 View (kub)  07/23/2012   *RADIOLOGY REPORT*  Clinical Data: Gastric and abdominal distension; no bowel activity, dark bloody material from NG tube  ABDOMEN - 1 VIEW  Comparison: CT abdomen pelvis - 07/30/2012  Findings:  An enteric tube tip and side port overlie the expected location of the gastric fundus.  There is a paucity of bowel gas without definite evidence of obstruction.  No pneumoperitoneum, pneumatosis or portal venous gas.  Post cholecystectomy.  Extensive vascular calcifications.  Multiple surgical clips overlie the lower pelvis. Post long segment thoracolumbar spine paraspinal fusion.  Linear support apparatus overlies the midline of the left mid hemiabdomen and is favored to be external to the patient.  IMPRESSION: 1.  Paucity of bowel gas without evidence of obstruction. 2.  Enteric tube tip and  side port project over the gastric fundus.   Original Report Authenticated By: Tacey Ruiz, MD   Ct Abdomen Pelvis W Contrast  07/22/2012   *RADIOLOGY REPORT*  Clinical Data: Several-day history of abdominal pain  CT ABDOMEN AND PELVIS WITH CONTRAST  Technique:  Multidetector CT imaging of the abdomen and pelvis was performed following the standard protocol during bolus administration of intravenous contrast.  Contrast: OMNIPAQUE IOHEXOL 300 MG/ML SOLN  Comparison: Prior lumbar spine MRI 12/04/2011; remote CT scan of the abdomen 08/30/2003  Findings:  Lower Chest:  Trace right pleural effusion.  Bibasilar subsegmental atelectasis versus chronic scarring.  The visualized heart is within normal limits for size.  No pericardial effusion. Atherosclerotic calcifications noted along the course of the left anterior descending coronary artery.  Unremarkable distal thoracic esophagus.  Abdomen: Patulous stomach containing a large volume of oral contrast material.  Unremarkable appearance of the duodenum, spleen and adrenal glands.  Diffuse fatty atrophy of the pancreas.  Stable hepatic cysts in the dome and adjacent to the fissure for the falciform ligament.  Minimal interval progression of intrahepatic and extrahepatic biliary ductal dilatation.  The common bile duct measures 1 cm in caliber compared to 8 mm in at 2005.  Surgical changes of prior cholecystectomy.  Slow interval enlargement of the exophytic simple cyst from the lower pole of the right kidney which currently measures 7.4 cm compared to 5.4 cm previously. Additional sub centimeter hypoattenuating lesions in both kidneys are too small for accurate characterization but likely simple cysts.  Symmetric perfusion of the renal parenchyma bilaterally.  Normal-caliber large small bowel throughout the abdomen.  No evidence of obstruction.  Scattered colonic diverticula without active inflammation.  No free fluid or suspicious adenopathy.  Pelvis: The bladder  is distended with urine.  Surgical changes of prostatectomy and bilateral pelvic lymph node dissection.  No free fluid or adenopathy. Bilateral fat containing inguinal hernias.  Bones: Extensive postsurgical changes of prior posterior thoracolumbar fusion with bilateral pedicle screw and rod counts construct spanning at least T11-S1.  Interbody grafts are noted at L2-L3, L3-L4, L4-L5 and L5-S1. Kyphotic dextroconvex scoliosis centered at L1-L3.  No definite soft tissue abscess.  Vascular: Diffuse atherosclerotic vascular calcification without aneurysmal dilatation.  IMPRESSION:  1.  No definite acute intra-abdominal or pelvic finding to explain the patient's clinical symptoms.  2.  Trace right pleural effusion with associated subsegmental atelectasis  3.  Atherosclerosis including coronary artery disease  4.  Minimal interval progression of biliary ductal dilatation compared to 2005 which may be related to the prior cholecystectomy. Recommend clinical correlation with serum bilirubin and LFTs.  5.  Enlarging right simple renal cyst now measures up to 7.4 cm in diameter.  6.  Severe kyphotic dextro convex scoliosis status post extensive posterior lumbar interbody fusion.   Original Report Authenticated By: Malachy Moan,  M.D.    Scheduled Meds: . ARIPiprazole  5 mg Oral Daily  . aspirin EC  81 mg Oral Daily  . budesonide-formoterol  2 puff Inhalation BID  . diazepam  5 mg Oral BID  . divalproex  1,000 mg Oral QHS  . DULoxetine  60 mg Oral Daily  . metoprolol succinate  50 mg Oral Daily  . ondansetron (ZOFRAN) IV  4 mg Intravenous Once  . pantoprazole (PROTONIX) IV  40 mg Intravenous Q12H  . simvastatin  40 mg Oral q1800  . sodium chloride  500 mL Intravenous Once  . tiotropium  18 mcg Inhalation Daily   Continuous Infusions: . sodium chloride 125 mL/hr at 07/23/12 0539   Time spent: 35 min  Britani Beattie L  [facility] Pager [phone]. If 7PM-7AM, please contact night-coverage at  [email or web address], password [facility] 07/23/2012, 4:35 PM  LOS: 1 day

## 2012-07-23 NOTE — ED Provider Notes (Signed)
I saw and evaluated the patient, reviewed the resident's note and I agree with the findings and plan. 67 yo man has had epigastric pain for 4 days. Has chronic back pain, for which he takes percocet, which has not helped the abdominal pain. He has not taken any antacid or other GI medications. He had called his gastroenterologist, Dr. Leary Roca, who advised him to come to the ED. He has had prior cholecystectomy. Exam shows him to be a pleasant elderly man in mild to moderate distress with epigastric pain. Abdomen shows mild epigastric tenderness. Lab workup ordered, and IV pain medication and IV Protonix ordered.  6:07 PM  CT of abdomen and pelvis shows enlarged stomach filled with contrast material, suggesting gastric outlet obstruction. Call to Dr. Dulce Sellar, covering for Dr. Ewing Schlein, advised NG tube and admission. Will contact [facility] to admit pt.        Carleene Cooper III, MD 07/23/12 [phone]

## 2012-07-23 NOTE — Op Note (Signed)
[facility] [address] [place], [address]   ENDOSCOPY PROCEDURE REPORT  PATIENT: Malik Zuniga, Malik Zuniga  MR#: [id number] BIRTHDATE: 02/16/1945 , 67  yrs. old GENDER: Male  ENDOSCOPIST: Vida Rigger, MD REFERRED BY:  PROCEDURE DATE:  07/23/2012 PROCEDURE:   EGD, diagnostic ASA CLASS:   Class III INDICATIONS:abnormal CT of the GI tract.  MEDICATIONS: Fentanyl 50 mcg IV and Versed 6 mg IV  TOPICAL ANESTHETIC:used  DESCRIPTION OF PROCEDURE:   After the risks benefits and alternatives of the procedure were thoroughly explained, informed consent was obtained.  The Pentax Gastroscope [id number]  endoscope was introduced through the mouth and advanced to the second portion of the duodenum , limited by Without limitations. after the NG was slowly removed and additional fluid was suctionedThe instrument was slowly withdrawn as the mucosa was fully examined.the findings are recorded below the patient tolerated the procedure well there was no obvious immediate complication         FINDINGS:1. NG tube trauma in the esophagus and stomach 2. Mild antritis doubt significant 3. Otherwise within normal limits to the second [place]on of the duodenum  COMPLICATIONS:none  ENDOSCOPIC IMPRESSION:above   RECOMMENDATIONS:clear liquid diet hold NG tube for now decreased dose of pain medicines since his problem was probably gastroparesis and gastric ileus secondary to too much narcotics and will follow with you   REPEAT EXAM: as needed   _______________________________ Vida Rigger, MD eSigned:  Vida Rigger, MD 07/23/2012 1:49 PM    CC:  PATIENT NAME:  Malik Zuniga, Malik Zuniga MR#: [id number]

## 2012-07-23 NOTE — Evaluation (Signed)
Occupational Therapy Evaluation Patient Details Name: Malik Zuniga MRN: [id number] DOB: 1945/05/30 Today's Date: 07/23/2012 Time: 4098-1191 OT Time Calculation (min): 28 min  OT Assessment / Plan / Recommendation Clinical Impression   This 67 y.o. Male admitted with abdominal pain - ? Gastric outlet obstruction.  Pt with complex medical history including TBI, multiple back surgeries, COPD, and MI.  Pt currently, is requiring supervision with BADLs and functional mobility due to fatigue, and pain meds, but is very close to his baseline level of functioning.  Wife present to confirm.  Anticipate he will progress to modified independent quickly, and wife is able to safely assist him.  Have instructed them to ambulate in hallway later this pm.  No formal OT recommended, will sign off.     OT Assessment  Patient does not need any further OT services    Follow Up Recommendations  No OT follow up    Barriers to Discharge      Equipment Recommendations  None recommended by OT    Recommendations for Other Services    Frequency       Precautions / Restrictions Restrictions Weight Bearing Restrictions: No       ADL  Eating/Feeding: NPO Grooming: Wash/dry face;Wash/dry hands;Teeth care;Supervision/safety Where Assessed - Grooming: Supported standing;Unsupported standing Upper Body Bathing: Set up Where Assessed - Upper Body Bathing: Unsupported sitting Lower Body Bathing: Supervision/safety Where Assessed - Lower Body Bathing: Unsupported standing;Supported sit to stand Upper Body Dressing: Set up Where Assessed - Upper Body Dressing: Unsupported sitting Lower Body Dressing: Supervision/safety Where Assessed - Lower Body Dressing: Supported sit to stand;Unsupported standing Toilet Transfer: Radiographer, therapeutic Method: Sit to stand;Stand pivot Acupuncturist: Comfort height toilet Toileting - Clothing Manipulation and Hygiene: Supervision/safety Where  Assessed - Engineer, mining and Hygiene: Standing Equipment Used: Rolling walker Transfers/Ambulation Related to ADLs: Pt ambulates with supervision with RW due to pt fatigue, pain meds.  Should progress to modified independent rapidly  ADL Comments: Pt is close to baseline.  Recommend superivsion when up due to pain meds and pt lethargy.  Has a h/o balance deficits, but pt at baseline per his and wife's report.  He and wife were instructed to ambulate on unit later in pm with wife's assist    OT Diagnosis:    OT Problem List:   OT Treatment Interventions:     OT Goals    Visit Information  Last OT Received On: 07/23/12 Assistance Needed: +1    Subjective Data  Subjective: "I really want to get back in bed Patient Stated Goal: To get better   Prior Functioning     Home Living Lives With: Spouse Available Help at Discharge: Available PRN/intermittently Type of Home: House Home Access: Stairs to enter [employer] of Steps: 6 Entrance Stairs-Rails: Right Home Layout: One level Bathroom Shower/Tub: Forensic scientist: Standard Bathroom Accessibility: Yes How Accessible: Accessible via walker Home Adaptive Equipment: Straight cane;Tub transfer bench;Walker - rolling;Walker - standard;Walker - four wheeled Prior Function Level of Independence: Independent with assistive device(s) (Walks with SPC) Able to Take Stairs?: Yes Driving: Yes Vocation: Retired Comments: Pt sleeps in Medical illustrator.  Wife only occasionally assists with LB ADLs when pain level is high Communication Communication: Expressive difficulties (Pt speaks low volume with mumbled speech - pt baseline)         Vision/Perception Perception Perception: Within Functional Limits Praxis Praxis: Intact   Cognition  Cognition Arousal/Alertness: Awake/alert Behavior During Therapy: WFL for tasks assessed/performed Overall Cognitive  Status: Within Functional Limits  for tasks assessed    Extremity/Trunk Assessment Right Upper Extremity Assessment RUE ROM/Strength/Tone: Within functional levels RUE Coordination: WFL - gross/fine motor Left Upper Extremity Assessment LUE ROM/Strength/Tone: Within functional levels LUE Coordination: WFL - gross/fine motor Trunk Assessment Trunk Assessment: Kyphotic     Mobility Bed Mobility Bed Mobility: Supine to Sit;Sitting - Scoot to Edge of Bed;Sit to Supine Supine to Sit: 5: Supervision;HOB elevated Sitting - Scoot to Edge of Bed: 5: Supervision Sit to Supine: 5: Supervision;HOB elevated Details for Bed Mobility Assistance: Pt sleep in recliner at home.  Requires HOB to be elevated to get in and out of bed Transfers Transfers: Sit to Stand;Stand to Sit Sit to Stand: 6: Modified independent (Device/Increase time);From bed Stand to Sit: 6: Modified independent (Device/Increase time);To bed;With upper extremity assist     Exercise     Balance     End of Session OT - End of Session Activity Tolerance: Patient tolerated treatment well Patient left: in bed;with call bell/phone within reach;with family/visitor present Nurse Communication: Mobility status  GO     Adam Sanjuan, Ursula Alert M 07/23/2012, 11:55 AM

## 2012-07-24 DIAGNOSIS — K5902 Outlet dysfunction constipation: Secondary | ICD-10-CM | POA: Diagnosis not present

## 2012-07-24 DIAGNOSIS — K5900 Constipation, unspecified: Secondary | ICD-10-CM | POA: Diagnosis present

## 2012-07-24 DIAGNOSIS — R112 Nausea with vomiting, unspecified: Secondary | ICD-10-CM | POA: Diagnosis not present

## 2012-07-24 DIAGNOSIS — M545 Low back pain: Secondary | ICD-10-CM | POA: Diagnosis not present

## 2012-07-24 DIAGNOSIS — R1013 Epigastric pain: Secondary | ICD-10-CM | POA: Diagnosis not present

## 2012-07-24 DIAGNOSIS — K310 Acute dilatation of stomach: Secondary | ICD-10-CM | POA: Diagnosis not present

## 2012-07-24 DIAGNOSIS — R109 Unspecified abdominal pain: Secondary | ICD-10-CM | POA: Diagnosis not present

## 2012-07-24 DIAGNOSIS — G8929 Other chronic pain: Secondary | ICD-10-CM | POA: Diagnosis not present

## 2012-07-24 DIAGNOSIS — R933 Abnormal findings on diagnostic imaging of other parts of digestive tract: Secondary | ICD-10-CM | POA: Diagnosis not present

## 2012-07-24 MED ORDER — ACETAMINOPHEN 325 MG PO TABS
650.0000 mg | ORAL_TABLET | Freq: Four times a day (QID) | ORAL | Status: DC | PRN
  Administered 2012-07-24: 650 mg via ORAL
  Filled 2012-07-24: qty 2

## 2012-07-24 MED ORDER — MORPHINE SULFATE 2 MG/ML IJ SOLN
1.0000 mg | Freq: Once | INTRAMUSCULAR | Status: AC
  Administered 2012-07-24: 1 mg via INTRAVENOUS
  Filled 2012-07-24: qty 1

## 2012-07-24 MED ORDER — POLYETHYLENE GLYCOL 3350 17 G PO PACK
17.0000 g | PACK | Freq: Every day | ORAL | Status: DC
  Administered 2012-07-24: 17 g via ORAL
  Filled 2012-07-24: qty 1

## 2012-07-24 MED ORDER — ENOXAPARIN SODIUM 30 MG/0.3ML ~~LOC~~ SOLN
40.0000 mg | SUBCUTANEOUS | Status: DC
  Administered 2012-07-24: 40 mg via SUBCUTANEOUS

## 2012-07-24 MED ORDER — POLYETHYLENE GLYCOL 3350 17 G PO PACK: 17.0000 g | PACK | Freq: Every day | ORAL | Status: DC

## 2012-07-24 NOTE — Progress Notes (Signed)
Malik Zuniga 10:52 AM  Subjective: The patient is doing much better and is tolerating clear liquids but has not had a bowel movement but wants to go home and his case was discussed with the hospital team as well  Objective: Vital signs stable afebrile no acute distress abdomen is soft nontender labs stable  Assessment: Improved  Plan: We discussed how probably his Percocet was causing gastroparesis and he should not take that and he'll need to continue his MiraLax and agree with advancing diet and hopefully home soon and he knows to call me when necessary  [facility] E

## 2012-07-24 NOTE — Discharge Summary (Signed)
Physician Discharge Summary  Malik Zuniga JXB:[id number] DOB: 09/02/1945 DOA: 07/22/2012  PCP: Neena Rhymes, MD  Admit date: 07/22/2012 Discharge date: 07/24/2012  Time spent: greater than 30 minutes  Discharge Diagnoses:  Principal Problem:   Nausea with vomiting Active Problems:   Abdominal pain, acute, epigastric   HYPERTENSION   Hyponatremia   HYPERLIPIDEMIA   BIPOLAR DISORDER UNSPECIFIED   Chronic lower back pain   Transaminitis   Unspecified constipation  Discharge Condition: stable  Filed Weights   07/22/12 2100 07/23/12 0521 07/24/12 0453  Weight: 70.9 kg (156 lb 4.9 oz) 70.9 kg (156 lb 4.9 oz) 70.9 kg (156 lb 4.9 oz)   History of present illness:  67 y.o. male  Wit extensive PMH including MI s/p stent placement, CVA, HTN, HL, PNA, prostate cancer, COPD, and s/p multiple back surgeries, craniotomy and cholecystectomy presenting to the ED today with complaints of severe epigastric abdominal pain since Sunday. He has had Worsening chronic back pain that he has been taking percocet and has gotten constipated. As a result, on Sunday, he decided to take 4 laxative tablets and 5 glasses of miralax that eventually resulted in one large BM. But since then, he has been having increasing abdominal pain to the point that he could not take it anymore today. In the meantime, he has continued to take his percocet for back pain, including 4 on Tuesday and then 3 tablets today. His last BM yes on Tuesday 07/20/12. He continues to take his Percocet as well as aspirin 81 mg and a combination pill of aspirin acetaminophen and caffeine (Excedrin) He denies any vomiting some nausea he continues to pass gas. He relates and his abdominal pain is not made worse or better by anything, has progressively gotten worse to the point where he has anorexia. He denies any chest pain, cough, fever, diaphoresis, joint swelling, diarrhea vomiting, dysuria or alcohol consumption.  In the ED:  CBC was done that  showed a white count of 10.3 with a left shift of 8.1, basic metabolic panel shows hyponatremia and hypochloremia compatible with decreased intravascular volume. His hemoglobin has remained stable at 11.6 (compare from her previous hemoglobin on 07/02/2012 of 11.3), his lipase is within normal limits his, his guaiac is negative, his UA does not show any signs of infection, his chest x-ray does not show any infiltrate. 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 NGT placed, Bowel rest, laxatives, PPI, supportive care, minimized opiates.  GI consulted and EGD showed NG tube trauma in the esophagus and stomach 2. Mild antritis doubt significant 3. Otherwise within normal limits. Pain and vomiting resolved. Had several bowel movements. Stop percocet at home.   Procedures:  EGD  Consultations:  Magod  Discharge Exam: Filed Vitals:   07/23/12 2040 07/24/12 0453 07/24/12 1030 07/24/12 1405  BP: 141/64 165/92 141/80 125/69  Pulse: 103 105 116 102  Temp: 98.3 F (36.8 C) 98.6 F (37 C) 86.6 F (30.3 C) 98.4 F (36.9 C)  TempSrc: Oral Oral Oral Oral  Resp: 16 15 18 18   Height:      Weight:  70.9 kg (156 lb 4.9 oz)    SpO2: 97% 96% 93% 93%    General: comfortable Abd:  BS present, S,nt,nd  Discharge Instructions  Discharge Orders   Future Appointments Provider Department Dept Phone  11/01/2012 9:15 AM Sheliah Hatch, MD [facility]  [place] [phone]   01/10/2013 10:30 AM Barbaraann Share, MD  [facility] [phone]   Future Orders Complete By Expires     Activity as tolerated - No restrictions  As directed     Diet - low sodium heart healthy  As directed         Medication List    STOP taking these medications       ALKA-SELTZER PLUS COLD & COUGH  06-11-08-325 MG Caps  Generic drug:  [id number]-APAP     oxyCODONE-acetaminophen 10-325 MG per tablet  Commonly known as:  PERCOCET      TAKE these medications       acetaminophen 325 MG tablet  Commonly known as:  TYLENOL  Take 650 mg by mouth every 6 (six) hours as needed. For pain     albuterol 108 (90 BASE) MCG/ACT inhaler  Commonly known as:  PROVENTIL HFA;VENTOLIN HFA  Inhale 2 puffs into the lungs every 6 (six) hours as needed. For shortness of breath     ARIPiprazole 5 MG tablet  Commonly known as:  ABILIFY  Take 5 mg by mouth daily. Taking half tablet daily.     aspirin 81 MG EC tablet  Take 81 mg by mouth daily.     aspirin-acetaminophen-caffeine 250-250-65 MG per tablet  Commonly known as:  EXCEDRIN MIGRAINE  Take 2 tablets by mouth every 6 (six) hours as needed. For headaches     budesonide-formoterol 160-4.5 MCG/ACT inhaler  Commonly known as:  SYMBICORT  Inhale 2 puffs into the lungs 2 (two) times daily.     cholecalciferol 1000 UNITS tablet  Commonly known as:  VITAMIN D  Take 1,000 Units by mouth daily.     diazepam 5 MG tablet  Commonly known as:  VALIUM  Take 5 mg by mouth 2 (two) times daily. scheduled     divalproex 500 MG 24 hr tablet  Commonly known as:  DEPAKOTE ER  Take 1,000 mg by mouth at bedtime.     docusate sodium 100 MG capsule  Commonly known as:  COLACE  Take 100-200 mg by mouth daily. Take 1 tablet in the morning and 2 tablets at  bedtime     DULoxetine 60 MG capsule  Commonly known as:  CYMBALTA  Take 60 mg by mouth daily.     FISH OIL TRIPLE STRENGTH 1400 MG Caps  Take 1 capsule by mouth daily.     fluticasone 50 MCG/ACT nasal spray  Commonly known as:  FLONASE  PLACE 2 SPRAYS INTO THE NOSE DAILY.     losartan-hydrochlorothiazide 50-12.5 MG per tablet  Commonly known as:  HYZAAR  Take 1 tablet by mouth every evening.     metoprolol succinate 50 MG 24 hr tablet  Commonly known as:  TOPROL-XL  Take 50 mg by mouth  daily. Take with or immediately following a meal.     montelukast 10 MG tablet  Commonly known as:  SINGULAIR  Take 1 tablet (10 mg total) by mouth at bedtime.     nitroGLYCERIN 0.4 MG SL tablet  Commonly known as:  NITROSTAT  Place 1 tablet (0.4 mg total) under the tongue every 5 (five) minutes as needed. For chest pain     omeprazole 40 MG capsule  Commonly known as:  PRILOSEC  Take 40 mg by mouth 2 (two) times daily.     ondansetron 4 MG disintegrating tablet  Commonly known as:  ZOFRAN-ODT  Take 1 tablet (4 mg total) by mouth every 8 (eight) hours as needed for nausea.     polyethylene glycol packet  Commonly known as:  MIRALAX / GLYCOLAX  Take 17 g by mouth daily. For constipation     primidone 250 MG tablet  Commonly known as:  MYSOLINE  Take 1 tablet (250 mg total) by mouth 2 (two) times daily.     simvastatin 40 MG tablet  Commonly known as:  ZOCOR  Take 1 tablet (40 mg total) by mouth every evening.     tiotropium 18 MCG inhalation capsule  Commonly known as:  SPIRIVA HANDIHALER  Place 1 capsule (18 mcg total) into inhaler and inhale daily.     vitamin C 500 MG tablet  Commonly known as:  ASCORBIC ACID  Take 1,000 mg by mouth daily.     Zinc 50 MG Tabs  Take 50 mg by mouth daily.       Allergies  Allergen Reactions  . Lisinopril Swelling    ANGIOEDEMA  . Ambien (Zolpidem) Other (See Comments)    Unknown reaction  . Lamictal (Lamotrigine) Other (See Comments)    Weakness/difficulty swallowing       Follow-up Information   Follow up with [facility], MD. (As needed)    Contact information:   [address]                         [place] [address] [phone]        The results of significant diagnostics from this hospitalization (including imaging, microbiology, ancillary and laboratory) are listed below for reference.    Significant Diagnostic Studies: Dg Chest 1 View  07/22/2012   *RADIOLOGY REPORT*   Clinical Data: Abdominal pain.  CHEST - 1 VIEW  Comparison: 07/02/2012.  Findings: Normal sized heart.  Clear lungs.  Stable scoliosis and Harrington rods.  IMPRESSION: No acute abnormality.   Original Report Authenticated By: Beckie Salts, M.D.   Dg Ribs Unilateral W/chest Right  07/03/2012   *RADIOLOGY REPORT*  Clinical Data: Anterior lower rib pain, radiating posteriorly.  RIGHT RIBS AND CHEST - 3+ VIEW  Comparison: None.  Findings: A vague density at the left lung base may reflect an overlying nipple shadow.  Would suggest repeat chest radiograph with nipple markers.  The lungs are otherwise grossly clear.  No pleural effusion or pneumothorax is seen.  The cardiomediastinal silhouette is normal in size.  Thoracolumbar spinal fusion hardware is partially imaged.  No displaced rib fractures are seen.  Clips are noted within the right upper quadrant, reflecting prior cholecystectomy.  Postoperative change is noted at the right distal humerus.  IMPRESSION:  1.  Vague density at the left lung base may reflect an overlying nipple shadow.  Recommend repeat chest radiograph with nipple markers. 2.  Lungs otherwise clear.  No displaced rib fractures seen.   Original Report Authenticated By: Tonia Ghent, M.D.   Abd 1 View (kub)  07/23/2012   *RADIOLOGY REPORT*  Clinical Data: Gastric and abdominal distension; no bowel activity, dark bloody material from NG tube  ABDOMEN - 1 VIEW  Comparison: CT abdomen pelvis - 07/30/2012  Findings:  An enteric tube tip and side port overlie the expected location of the gastric fundus.  There is a paucity of bowel gas without definite evidence of obstruction.  No pneumoperitoneum, pneumatosis or portal venous gas.  Post cholecystectomy.  Extensive vascular calcifications.  Multiple surgical  clips overlie the lower pelvis. Post long segment thoracolumbar spine paraspinal fusion.  Linear support apparatus overlies the midline of the left mid hemiabdomen and is favored to be external  to the patient.  IMPRESSION: 1.  Paucity of bowel gas without evidence of obstruction. 2.  Enteric tube tip and side port project over the gastric fundus.   Original Report Authenticated By: Tacey Ruiz, MD   Ct Abdomen Pelvis W Contrast  07/22/2012   *RADIOLOGY REPORT*  Clinical Data: Several-day history of abdominal pain  CT ABDOMEN AND PELVIS WITH CONTRAST  Technique:  Multidetector CT imaging of the abdomen and pelvis was performed following the standard protocol during bolus administration of intravenous contrast.  Contrast: OMNIPAQUE IOHEXOL 300 MG/ML SOLN  Comparison: Prior lumbar spine MRI 12/04/2011; remote CT scan of the abdomen 08/30/2003  Findings:  Lower Chest:  Trace right pleural effusion.  Bibasilar subsegmental atelectasis versus chronic scarring.  The visualized heart is within normal limits for size.  No pericardial effusion. Atherosclerotic calcifications noted along the course of the left anterior descending coronary artery.  Unremarkable distal thoracic esophagus.  Abdomen: Patulous stomach containing a large volume of oral contrast material.  Unremarkable appearance of the duodenum, spleen and adrenal glands.  Diffuse fatty atrophy of the pancreas.  Stable hepatic cysts in the dome and adjacent to the fissure for the falciform ligament.  Minimal interval progression of intrahepatic and extrahepatic biliary ductal dilatation.  The common bile duct measures 1 cm in caliber compared to 8 mm in at 2005.  Surgical changes of prior cholecystectomy.  Slow interval enlargement of the exophytic simple cyst from the lower pole of the right kidney which currently measures 7.4 cm compared to 5.4 cm previously. Additional sub centimeter hypoattenuating lesions in both kidneys are too small for accurate characterization but likely simple cysts.  Symmetric perfusion of the renal parenchyma bilaterally.  Normal-caliber large small bowel throughout the abdomen.  No evidence of obstruction.  Scattered  colonic diverticula without active inflammation.  No free fluid or suspicious adenopathy.  Pelvis: The bladder is distended with urine.  Surgical changes of prostatectomy and bilateral pelvic lymph node dissection.  No free fluid or adenopathy. Bilateral fat containing inguinal hernias.  Bones: Extensive postsurgical changes of prior posterior thoracolumbar fusion with bilateral pedicle screw and rod counts construct spanning at least T11-S1.  Interbody grafts are noted at L2-L3, L3-L4, L4-L5 and L5-S1. Kyphotic dextroconvex scoliosis centered at L1-L3.  No definite soft tissue abscess.  Vascular: Diffuse atherosclerotic vascular calcification without aneurysmal dilatation.  IMPRESSION:  1.  No definite acute intra-abdominal or pelvic finding to explain the patient's clinical symptoms.  2.  Trace right pleural effusion with associated subsegmental atelectasis  3.  Atherosclerosis including coronary artery disease  4.  Minimal interval progression of biliary ductal dilatation compared to 2005 which may be related to the prior cholecystectomy. Recommend clinical correlation with serum bilirubin and LFTs.  5.  Enlarging right simple renal cyst now measures up to 7.4 cm in diameter.  6.  Severe kyphotic dextro convex scoliosis status post extensive posterior lumbar interbody fusion.   Original Report Authenticated By: Malachy Moan, M.D.    Microbiology: No results found for this or any previous visit (from the past 240 hour(s)).   Labs: Basic Metabolic Panel:  Recent Labs Lab 07/22/12 1205 07/23/12 0510  NA 131* 130*  K 4.9 4.0  CL 90* 92*  CO2 30 23  GLUCOSE 101* 85  BUN 9 6  CREATININE 0.64  0.50  CALCIUM 10.0 9.2   Liver Function Tests:  Recent Labs Lab 07/22/12 1205 07/23/12 0510  AST 58* 26  ALT 45 28  ALKPHOS 243* 202*  BILITOT 0.2* 0.2*  PROT 7.7 7.0  ALBUMIN 3.2* 2.8*    Recent Labs Lab 07/22/12 1205  LIPASE 20   No results found for this basename: AMMONIA,  in the  last 168 hours CBC:  Recent Labs Lab 07/22/12 1205 07/23/12 0510  WBC 10.3 9.7  NEUTROABS 8.1*  --   HGB 11.6* 11.0*  HCT 33.0* 31.9*  MCV 81.7 81.2  PLT 535* 546*   Signed:  Keiaira Donlan L  [facility] 07/24/2012, 2:28 PM

## 2012-07-27 DIAGNOSIS — R109 Unspecified abdominal pain: Secondary | ICD-10-CM | POA: Diagnosis not present

## 2012-07-27 DIAGNOSIS — K5901 Slow transit constipation: Secondary | ICD-10-CM | POA: Diagnosis not present

## 2012-08-24 ENCOUNTER — Ambulatory Visit (INDEPENDENT_AMBULATORY_CARE_PROVIDER_SITE_OTHER)
Admission: RE | Admit: 2012-08-24 | Discharge: 2012-08-24 | Disposition: A | Discharge status: Home / Self Care | Payer: Medicare Other | Source: Ambulatory Visit | Attending: Family Medicine | Admitting: Family Medicine

## 2012-08-24 ENCOUNTER — Telehealth: Payer: Self-pay | Admitting: *Deleted

## 2012-08-24 ENCOUNTER — Encounter: Payer: Self-pay | Admitting: Family Medicine

## 2012-08-24 ENCOUNTER — Ambulatory Visit (INDEPENDENT_AMBULATORY_CARE_PROVIDER_SITE_OTHER): Payer: Medicare Other | Admitting: Family Medicine

## 2012-08-24 VITALS — BP 118/68 | HR 103 | Temp 97.4°F | Wt 148.4 lb

## 2012-08-24 DIAGNOSIS — R1013 Epigastric pain: Secondary | ICD-10-CM

## 2012-08-24 DIAGNOSIS — R109 Unspecified abdominal pain: Secondary | ICD-10-CM | POA: Diagnosis not present

## 2012-08-24 DIAGNOSIS — R52 Pain, unspecified: Secondary | ICD-10-CM

## 2012-08-24 LAB — CBC WITH DIFFERENTIAL/PLATELET
Basophils Absolute: 0 10*3/uL (ref 0.0–0.1)
Basophils Relative: 0.1 % (ref 0.0–3.0)
HCT: 35 % — ABNORMAL LOW (ref 39.0–52.0)
Hemoglobin: 11.5 g/dL — ABNORMAL LOW (ref 13.0–17.0)
Lymphocytes Relative: 22.6 % (ref 12.0–46.0)
Lymphs Abs: 1.7 10*3/uL (ref 0.7–4.0)
MCHC: 32.8 g/dL (ref 30.0–36.0)
Monocytes Relative: 6.2 % (ref 3.0–12.0)
Neutro Abs: 5.2 10*3/uL (ref 1.4–7.7)
RBC: 4.03 Mil/uL — ABNORMAL LOW (ref 4.22–5.81)
RDW: 16.5 % — ABNORMAL HIGH (ref 11.5–14.6)

## 2012-08-24 LAB — BASIC METABOLIC PANEL
BUN: 10 mg/dL (ref 6–23)
CO2: 26 mEq/L (ref 19–32)
Calcium: 9.5 mg/dL (ref 8.4–10.5)
Chloride: 92 mEq/L — ABNORMAL LOW (ref 96–112)
Creatinine, Ser: 0.6 mg/dL (ref 0.4–1.5)
Glucose, Bld: 84 mg/dL (ref 70–99)

## 2012-08-24 LAB — H. PYLORI ANTIBODY, IGG: H Pylori IgG: NEGATIVE

## 2012-08-24 LAB — HEPATIC FUNCTION PANEL
ALT: 15 U/L (ref 0–53)
Albumin: 3.4 g/dL — ABNORMAL LOW (ref 3.5–5.2)
Bilirubin, Direct: 0 mg/dL (ref 0.0–0.3)
Total Protein: 7.9 g/dL (ref 6.0–8.3)

## 2012-08-24 NOTE — Progress Notes (Signed)
  Subjective:    Patient ID: Malik Zuniga, male    DOB: September 19, 1945, 67 y.o.   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  Had NG decompression and endoscopy hospitalized.  Pt reports flare of abd pain starting on Sat/Sun.  Called GI and was told provider was out of office x1 week and was told to call PCP (when we called GI they indicated he could see another provider).  Pt reports he's now having 'severe pain' across upper abd and in LLQ.  Having nausea associated w/ pain.  Last BM yesterday- reports daily BMs since taking Miralax but feels that these are not complete evacuations.  'i feel awful'.  Pt reports he has not taken Percocet since hospital d/c but is taking Vicodin 5-325mg  regularly (1 tab every 4 hrs)  Denies decreased appetite.  Pain will slightly improve w/ BM.   Review of Systems For ROS see HPI     Objective:   Physical Exam  Vitals reviewed. Constitutional: No distress.  Thin, frail but appears at baseline  Abdominal: Soft. He exhibits distension (mild distention). He exhibits no mass. There is no tenderness (no TTP anywhere on abdomen). There is no rebound and no guarding.  Diffuse hyperactive BS  Skin: Skin is warm and dry.  Psychiatric:  Calm, well spoken despite claims of terrible pain          Assessment & Plan:

## 2012-08-24 NOTE — Telephone Encounter (Addendum)
Spoke with patient. States a severe upper abdominal pain, rating pain a 7. States that GI would not see him because his MD was not there. Recommended several times to go to ED if he feels that he is in that critical of a condition. Patient states that he can't sit in ED and wait that he is in too much pain. Advised to let the ED staff know that when he checks in. Patient states that unless he is admitted that they will charge him a large bill and he doesn't have the money to pay it.  Spoke with Britta Mccreedy at Dr. Marlane Hatcher office. She has spoken with the patient several times and her reason for sending him to PCP was due to his additional complaints of dizziness and falling. Patient has not notified this office of these symptoms. Dr Marlane Hatcher office stated that another provider would see him if he would like to come in but that they would start with the last note to see if he was complying with the recommendations. Patient has been advised several times to go to ED but refuses. Patient stated that he was not dizzy but had fallen twice yesterday. Dr. Beverely Low will see him today. GF/RN

## 2012-08-24 NOTE — Telephone Encounter (Signed)
Spoke with pt concerning upcoming appt today to attempt to triage sx. Patient stated "I really think I need to go to the hospital but medicare won't pay for it if I go to the ER without a Dr sending me." Patient stated he has been having intermittent abdominal pain since his recent discharge, no constipation but does have c/o nausea w/o vomiting. I advised the pt that we could see him but that we may also send to back to the hospital for further evaluation if Dr. Beverely Low feels it was appropriate.

## 2012-08-24 NOTE — Telephone Encounter (Signed)
If pt feels he needs to go to the ER, he definitely should contact his GI office and discuss this w/ them- OR go to the ER as he feels he should

## 2012-08-24 NOTE — Assessment & Plan Note (Signed)
Pt continues to have severe abdominal pain (by report but not apparent on PE).  Required NG decompression of bowels due to narcotic induced gastroparesis 1 month ago.  Pt continues to take vicodin q4 hrs despite warnings to limit narcotic use.  Pt w/out TTP on exam making diverticulitis very unlikely.  Hyperactive BS- will get abd films to r/o bowel obstruction.  Check labs to r/o infxn or metabolic abnormality.  Again encouraged pt to limit narcotics.  Pt to f/u w/ GI as this is clearly an ongoing problem.  Pt expressed understanding and is in agreement w/ plan.

## 2012-08-24 NOTE — Patient Instructions (Addendum)
Go to [address] and get your xrays done- we'll call you with your results We'll notify you of your lab results Try and limit your vicodin intake- this is causing bowel delays PLEASE CALL GI!! Call with any questions or concerns Hang in there!

## 2012-08-28 ENCOUNTER — Other Ambulatory Visit: Payer: Self-pay | Admitting: Family Medicine

## 2012-08-30 ENCOUNTER — Other Ambulatory Visit: Payer: Self-pay | Admitting: Family Medicine

## 2012-08-30 DIAGNOSIS — K5901 Slow transit constipation: Secondary | ICD-10-CM | POA: Diagnosis not present

## 2012-08-30 DIAGNOSIS — R109 Unspecified abdominal pain: Secondary | ICD-10-CM | POA: Diagnosis not present

## 2012-08-31 NOTE — Telephone Encounter (Signed)
Rx sent to the pharmacy by e-script.//AB/CMA 

## 2012-09-02 ENCOUNTER — Other Ambulatory Visit: Payer: Self-pay | Admitting: Family Medicine

## 2012-09-06 DIAGNOSIS — F331 Major depressive disorder, recurrent, moderate: Secondary | ICD-10-CM | POA: Diagnosis not present

## 2012-09-30 DIAGNOSIS — M4120 Other idiopathic scoliosis, site unspecified: Secondary | ICD-10-CM | POA: Diagnosis not present

## 2012-10-18 ENCOUNTER — Ambulatory Visit
Admission: RE | Admit: 2012-10-18 | Discharge: 2012-10-18 | Disposition: A | Discharge status: Home / Self Care | Payer: Medicare Other | Source: Ambulatory Visit | Attending: Gastroenterology | Admitting: Gastroenterology

## 2012-10-18 ENCOUNTER — Other Ambulatory Visit: Payer: Self-pay | Admitting: Gastroenterology

## 2012-10-18 DIAGNOSIS — R109 Unspecified abdominal pain: Secondary | ICD-10-CM

## 2012-10-18 DIAGNOSIS — R141 Gas pain: Secondary | ICD-10-CM | POA: Diagnosis not present

## 2012-10-19 ENCOUNTER — Telehealth: Payer: Self-pay | Admitting: General Practice

## 2012-10-19 NOTE — Telephone Encounter (Signed)
Pt wife called stating that they needed a refill of the pt's zofran due to nausea. Per last OV note it states pt needs to follow up with GI. I do not see where pt has an upcoming (unless GI is not through [facility]) Wife states if refill is ok they would prefer the non-disintegrating tablets and use the [facility] on [address].

## 2012-10-19 NOTE — Telephone Encounter (Signed)
Pt is seeing [facility] and should get refill from them if he continues to have nausea

## 2012-10-19 NOTE — Telephone Encounter (Signed)
Noted will contact pt and notify.

## 2012-11-01 ENCOUNTER — Ambulatory Visit: Payer: Medicare Other | Admitting: Family Medicine

## 2012-11-02 ENCOUNTER — Ambulatory Visit: Payer: Medicare Other | Admitting: Family Medicine

## 2012-11-04 ENCOUNTER — Ambulatory Visit (INDEPENDENT_AMBULATORY_CARE_PROVIDER_SITE_OTHER): Payer: Medicare Other | Admitting: Family Medicine

## 2012-11-04 ENCOUNTER — Encounter: Payer: Self-pay | Admitting: Family Medicine

## 2012-11-04 VITALS — BP 120/82 | HR 65 | Temp 98.5°F | Wt 148.6 lb

## 2012-11-04 DIAGNOSIS — G8929 Other chronic pain: Secondary | ICD-10-CM

## 2012-11-04 DIAGNOSIS — L708 Other acne: Secondary | ICD-10-CM

## 2012-11-04 DIAGNOSIS — M545 Low back pain: Secondary | ICD-10-CM | POA: Diagnosis not present

## 2012-11-04 DIAGNOSIS — I10 Essential (primary) hypertension: Secondary | ICD-10-CM | POA: Diagnosis not present

## 2012-11-04 DIAGNOSIS — E785 Hyperlipidemia, unspecified: Secondary | ICD-10-CM

## 2012-11-04 DIAGNOSIS — L700 Acne vulgaris: Secondary | ICD-10-CM

## 2012-11-04 MED ORDER — ALBUTEROL SULFATE HFA 108 (90 BASE) MCG/ACT IN AERS: 2.0000 | INHALATION_SPRAY | Freq: Four times a day (QID) | RESPIRATORY_TRACT | Status: DC | PRN

## 2012-11-04 MED ORDER — BUDESONIDE-FORMOTEROL FUMARATE 160-4.5 MCG/ACT IN AERO: 2.0000 | INHALATION_SPRAY | Freq: Two times a day (BID) | RESPIRATORY_TRACT | Status: DC

## 2012-11-04 MED ORDER — FLUTICASONE PROPIONATE 50 MCG/ACT NA SUSP: NASAL | Status: DC

## 2012-11-04 MED ORDER — ALBUTEROL SULFATE HFA 108 (90 BASE) MCG/ACT IN AERS: 2.0000 | INHALATION_SPRAY | RESPIRATORY_TRACT | Status: DC | PRN

## 2012-11-04 MED ORDER — TIOTROPIUM BROMIDE MONOHYDRATE 18 MCG IN CAPS: 18.0000 ug | ORAL_CAPSULE | Freq: Every day | RESPIRATORY_TRACT | Status: DC

## 2012-11-04 NOTE — Assessment & Plan Note (Signed)
New.  Removed large amount of hard, black debris.  Pt's pain improved instantly.  No evidence of infxn.  Reassurance provided.

## 2012-11-04 NOTE — Assessment & Plan Note (Signed)
Chronic problem.  Tolerating statin w/out difficulty.  Check labs.  Adjust meds prn  

## 2012-11-04 NOTE — Progress Notes (Signed)
  Subjective:    Patient ID: Malik Zuniga, male    DOB: 01-29-1946, 67 y.o.   MRN: [id number]  HPI HTN- chronic problem, well controlled today on Hyzaar, Metoprolol.  Denies CP, SOB, HAs, visual changes, edema.  Hyperlipidemia- chronic problem, on Simvastatin.  Denies abd pain, N/V, myalgias.  Back pain- chronic problem, seeing Dr Jordan Likes.  Had '2 parallel rods' implanted.  'experiencing awful pain'.  Received 200 hydrocodone on 9/17.  Skin lesion- mid back, painful, dark in color.  No drainage.  Uncertain how long it has been there.   Review of Systems For ROS see HPI     Objective:   Physical Exam  Vitals reviewed. Constitutional: He is oriented to person, place, and time. No distress.  Thin, disheveled  HENT:  Head: Normocephalic and atraumatic.  Eyes: Conjunctivae and EOM are normal. Pupils are equal, round, and reactive to light.  Neck: Normal range of motion. Neck supple. No thyromegaly present.  Cardiovascular: Normal rate, regular rhythm, normal heart sounds and intact distal pulses.   No murmur heard. Pulmonary/Chest: Effort normal and breath sounds normal. No respiratory distress.  Abdominal: Soft. Bowel sounds are normal. He exhibits no distension.  Musculoskeletal: He exhibits no edema.  Lymphadenopathy:    He has no cervical adenopathy.  Neurological: He is alert and oriented to person, place, and time. No cranial nerve deficit.  Skin: Skin is warm and dry.  2.5 cm open comedone w/ hard blackhead present- blackhead expressed and pt's pain improved  Psychiatric: He has a normal mood and affect. His behavior is normal.          Assessment & Plan:

## 2012-11-04 NOTE — Patient Instructions (Addendum)
Schedule your complete physical in 6 months We'll notify you of your lab results and make any changes if needed Call with any questions or concerns Hang in there!!!

## 2012-11-04 NOTE — Assessment & Plan Note (Signed)
Chronic problem.  Well controlled.  Asymptomatic.  Check labs.  No anticipated med changes. 

## 2012-11-04 NOTE — Assessment & Plan Note (Signed)
Unchanged.  Pt following w/ Dr Jordan Likes.  Refusing additional surgery at this time.  Controlled on Vicodin given by Neurosurg office.

## 2012-11-08 ENCOUNTER — Other Ambulatory Visit: Payer: Self-pay | Admitting: Family Medicine

## 2012-11-08 NOTE — Telephone Encounter (Signed)
Rx filled

## 2012-11-09 ENCOUNTER — Other Ambulatory Visit (HOSPITAL_COMMUNITY): Payer: Self-pay | Admitting: Psychiatry

## 2012-11-25 ENCOUNTER — Other Ambulatory Visit: Payer: Self-pay | Admitting: Interventional Cardiology

## 2012-12-02 ENCOUNTER — Telehealth: Payer: Self-pay | Admitting: Family Medicine

## 2012-12-02 NOTE — Telephone Encounter (Signed)
FYI

## 2012-12-02 NOTE — Telephone Encounter (Addendum)
Malik Zuniga from [facility] called to let dr Malik Zuniga cma know that she is sending over a letter for Malik Zuniga regarding his care.

## 2012-12-16 ENCOUNTER — Ambulatory Visit (INDEPENDENT_AMBULATORY_CARE_PROVIDER_SITE_OTHER): Payer: Medicare Other | Admitting: *Deleted

## 2012-12-16 DIAGNOSIS — Z23 Encounter for immunization: Secondary | ICD-10-CM

## 2012-12-21 ENCOUNTER — Other Ambulatory Visit: Payer: Self-pay | Admitting: General Practice

## 2012-12-21 ENCOUNTER — Other Ambulatory Visit: Payer: Self-pay

## 2012-12-21 MED ORDER — PRIMIDONE 250 MG PO TABS: ORAL_TABLET | ORAL | Status: DC

## 2012-12-21 MED ORDER — FLUTICASONE PROPIONATE 50 MCG/ACT NA SUSP: NASAL | Status: DC

## 2012-12-21 MED ORDER — MONTELUKAST SODIUM 10 MG PO TABS: ORAL_TABLET | ORAL | Status: DC

## 2012-12-21 MED ORDER — TIOTROPIUM BROMIDE MONOHYDRATE 18 MCG IN CAPS: 18.0000 ug | ORAL_CAPSULE | Freq: Every day | RESPIRATORY_TRACT | Status: DC

## 2012-12-21 MED ORDER — METOPROLOL SUCCINATE ER 50 MG PO TB24: 50.0000 mg | ORAL_TABLET | Freq: Every day | ORAL | Status: DC

## 2012-12-21 MED ORDER — BUDESONIDE-FORMOTEROL FUMARATE 160-4.5 MCG/ACT IN AERO: 2.0000 | INHALATION_SPRAY | Freq: Two times a day (BID) | RESPIRATORY_TRACT | Status: DC

## 2012-12-21 MED ORDER — LOSARTAN POTASSIUM-HCTZ 50-12.5 MG PO TABS: ORAL_TABLET | ORAL | Status: DC

## 2012-12-21 NOTE — Telephone Encounter (Signed)
Med filled.  

## 2012-12-29 DIAGNOSIS — M4120 Other idiopathic scoliosis, site unspecified: Secondary | ICD-10-CM | POA: Diagnosis not present

## 2012-12-29 DIAGNOSIS — M546 Pain in thoracic spine: Secondary | ICD-10-CM | POA: Diagnosis not present

## 2013-01-03 ENCOUNTER — Other Ambulatory Visit (HOSPITAL_COMMUNITY): Payer: Self-pay | Admitting: Psychiatry

## 2013-01-03 DIAGNOSIS — F331 Major depressive disorder, recurrent, moderate: Secondary | ICD-10-CM | POA: Diagnosis not present

## 2013-01-10 ENCOUNTER — Ambulatory Visit: Payer: Medicare Other | Admitting: Pulmonary Disease

## 2013-03-03 ENCOUNTER — Other Ambulatory Visit: Payer: Self-pay | Admitting: Family Medicine

## 2013-03-03 NOTE — Telephone Encounter (Signed)
Med filled.  

## 2013-03-16 DIAGNOSIS — M546 Pain in thoracic spine: Secondary | ICD-10-CM | POA: Diagnosis not present

## 2013-03-16 DIAGNOSIS — Z6829 Body mass index (BMI) 29.0-29.9, adult: Secondary | ICD-10-CM | POA: Diagnosis not present

## 2013-03-16 DIAGNOSIS — M4120 Other idiopathic scoliosis, site unspecified: Secondary | ICD-10-CM | POA: Diagnosis not present

## 2013-04-11 ENCOUNTER — Encounter: Payer: Self-pay | Admitting: Pulmonary Disease

## 2013-04-11 ENCOUNTER — Ambulatory Visit (INDEPENDENT_AMBULATORY_CARE_PROVIDER_SITE_OTHER): Payer: Medicare Other | Admitting: Pulmonary Disease

## 2013-04-11 VITALS — BP 104/62 | HR 85 | Temp 97.3°F | Ht 70.0 in | Wt 159.2 lb

## 2013-04-11 DIAGNOSIS — J438 Other emphysema: Secondary | ICD-10-CM | POA: Diagnosis not present

## 2013-04-11 DIAGNOSIS — J439 Emphysema, unspecified: Secondary | ICD-10-CM

## 2013-04-11 NOTE — Assessment & Plan Note (Signed)
The patient feels that he is clearly better since stopping smoking the end of last year. His cough and mucus have resolved, and he feels that his dyspnea on exertion has stabilized at a better level. I have asked him to continue on his current bronchodilator regimen, and to try and stay as active as possible.

## 2013-04-11 NOTE — Progress Notes (Signed)
   Subjective:    Patient ID: Malik Zuniga, male    DOB: 07-17-1945, 68 y.o.   MRN: [id number]  HPI The patient comes in today for followup of his known COPD. He has not had a pulmonary infection nor an acute exacerbation since last visit by his history, and has actually improved since being off cigarettes the end of last year. He has had resolution of his cough and mucus, and feels that his dyspnea on exertion is at a better baseline. He is having no issues with his maintenance bronchodilators.   Review of Systems  Constitutional: Negative for fever and unexpected weight change.  HENT: Positive for trouble swallowing. Negative for congestion, dental problem, ear pain, nosebleeds, postnasal drip, rhinorrhea, sinus pressure, sneezing and sore throat.   Eyes: Negative for redness and itching.  Respiratory: Positive for choking and shortness of breath. Negative for cough, chest tightness and wheezing.   Cardiovascular: Negative for palpitations and leg swelling.  Gastrointestinal: Negative for nausea and vomiting.  Genitourinary: Negative for dysuria.  Musculoskeletal: Negative for joint swelling.  Skin: Negative for rash.  Neurological: Negative for headaches.  Hematological: Does not bruise/bleed easily.  Psychiatric/Behavioral: Negative for dysphoric mood. The patient is not nervous/anxious.        Objective:   Physical Exam Frail-appearing male in no acute distress Nose without purulence or discharge noted Neck without lymphadenopathy or thyromegaly Chest with adequate airflow, a few scattered rhonchi, no wheezing Cardiac exam with regular rate and rhythm Lower extremities without edema, no cyanosis Alert and oriented, moves all 4 extremities.       Assessment & Plan:

## 2013-04-11 NOTE — Patient Instructions (Signed)
No change in your breathing medications. Congratulations on quitting smoking.  This is a very important part of your treatment. Stay as active as you can. followup with me again in 24mos.

## 2013-04-13 DIAGNOSIS — M4120 Other idiopathic scoliosis, site unspecified: Secondary | ICD-10-CM | POA: Diagnosis not present

## 2013-04-14 DIAGNOSIS — M4120 Other idiopathic scoliosis, site unspecified: Secondary | ICD-10-CM | POA: Diagnosis not present

## 2013-04-14 DIAGNOSIS — M8440XA Pathological fracture, unspecified site, initial encounter for fracture: Secondary | ICD-10-CM | POA: Diagnosis not present

## 2013-04-27 ENCOUNTER — Encounter: Payer: Medicare Other | Admitting: Family Medicine

## 2013-05-02 DIAGNOSIS — F331 Major depressive disorder, recurrent, moderate: Secondary | ICD-10-CM | POA: Diagnosis not present

## 2013-05-04 DIAGNOSIS — Z6829 Body mass index (BMI) 29.0-29.9, adult: Secondary | ICD-10-CM | POA: Diagnosis not present

## 2013-05-04 DIAGNOSIS — S22009A Unspecified fracture of unspecified thoracic vertebra, initial encounter for closed fracture: Secondary | ICD-10-CM | POA: Diagnosis not present

## 2013-05-11 HISTORY — PX: BACK SURGERY: SHX140

## 2013-05-12 ENCOUNTER — Other Ambulatory Visit: Payer: Self-pay | Admitting: Family Medicine

## 2013-05-12 NOTE — Telephone Encounter (Signed)
Med filled.  

## 2013-05-18 ENCOUNTER — Other Ambulatory Visit: Payer: Self-pay | Admitting: Neurosurgery

## 2013-05-18 DIAGNOSIS — S22009A Unspecified fracture of unspecified thoracic vertebra, initial encounter for closed fracture: Secondary | ICD-10-CM

## 2013-05-20 ENCOUNTER — Ambulatory Visit
Admission: RE | Admit: 2013-05-20 | Discharge: 2013-05-20 | Disposition: A | Discharge status: Home / Self Care | Payer: Medicare Other | Source: Ambulatory Visit | Attending: Neurosurgery | Admitting: Neurosurgery

## 2013-05-20 DIAGNOSIS — M519 Unspecified thoracic, thoracolumbar and lumbosacral intervertebral disc disorder: Secondary | ICD-10-CM | POA: Diagnosis not present

## 2013-05-20 DIAGNOSIS — S22009A Unspecified fracture of unspecified thoracic vertebra, initial encounter for closed fracture: Secondary | ICD-10-CM

## 2013-05-30 DIAGNOSIS — S22009A Unspecified fracture of unspecified thoracic vertebra, initial encounter for closed fracture: Secondary | ICD-10-CM | POA: Diagnosis not present

## 2013-06-06 ENCOUNTER — Telehealth: Payer: Self-pay | Admitting: Interventional Cardiology

## 2013-06-06 NOTE — Telephone Encounter (Signed)
New problem   Pt need clearance for sx and stated form was already faxed over from Dr Trenton Gammon. Please call pt.

## 2013-06-08 ENCOUNTER — Other Ambulatory Visit: Payer: Self-pay | Admitting: Neurosurgery

## 2013-06-08 NOTE — Telephone Encounter (Signed)
returned pt call.pt sts that he is doing ewel from a cardiac standpopint.no chest pain or increased sob.pt adv that he is cleared form a cardiac standpoint for surgery. and cardiac clearnce will be faxed to [facility] [place] office.pt agreeable with plan and verbalized understanding

## 2013-06-08 NOTE — Telephone Encounter (Signed)
Follow Up:  Pt states he is in a lot of pain and needs surgical clearance from the doctor. Pt is wanting to have back surgery --- Dr. Trenton Gammon - surgeon.

## 2013-06-14 ENCOUNTER — Encounter (HOSPITAL_COMMUNITY): Payer: Self-pay | Admitting: Pharmacy Technician

## 2013-06-14 ENCOUNTER — Other Ambulatory Visit: Payer: Self-pay | Admitting: Family Medicine

## 2013-06-14 NOTE — Telephone Encounter (Signed)
Med filled.  

## 2013-06-15 NOTE — Progress Notes (Signed)
Anesthesia PAT Evaluation:  Patient is a 68 year old male scheduled for T8-9 fusion augmentation and removal of hardware on 06/21/13 by Dr. Annette Stable.  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 PCP is Dr. [name].  Pulmonologist is Dr. Danton Sewer. Cardiologist is Dr. Daneen Schick.  It has been right at one year (last seen 06/23/12) since he was see, but Dr. Tamala Julian felt that if no chest pain or change in chronic dyspnea then he could be cleared for surgery.  Exam of patient showed a pleasant, Caucasian male in NAD.  He is in a wheelchair.  He reports intermittent use of Nitro for years (since before 2013), possibly twice a year.  Last time was ~ 6 months ago.  He has had no progressive use.  He feels at his baseline from a cardiopulmonary standpoint. When he gets symptoms of left chest pain they are sharp and only last for a few seconds.  There is no associated diaphoresis, nausea, increased SOB, jaw/arm pain. On exam, heart RRR. Lungs are relatively clear. His back appears both kyphotic and scoliotic.  No significant LE edema.  Echo on 07/21/11 [facility]) showed: Suboptimal image quality due to lung disease and poor sound wave transmission, LVEF estimated at 40-45%, normal LA size, Doppler findings suggestive of grade 1a diastolic dysfunction, trivial TR, AV sclerosis but opens well.  Nuclear stress test on 01/09/10 [facility]) showed: Normal myocardial perfusion scan demonstrating an attenuation artifact in the inferior region of the myocardium.  No ischemia or infarct/scar seen in the remaining myocardium.  EKG on 07/22/12 showed SR, non-specific anterolateral T wave  abnormality.  Baseline wanderer primarily in II, III, V4. Although his EKG was within the past year, I ordered a repeat since it was nearly 30 months old, and he had used Nitro at least once since then.  I wanted to ensure no new ST/T wave changes.  EKG today showed NSR, cannot rule out anterior infarct (age undetermined).  Low r wave in V3 is new since 07/22/12.  Could consider lead placement as I believe his EKG was done while he was in his wheelchair due to his back pain.  There is also some baseline artifact/movement.  CXR on 06/16/13 showed: No active cardiopulmonary disease.  Preoperative labs noted.    Reviewed above with anesthesiologist Dr. Orene Desanctis.  Patient without any new or progressive cardiopulmonary symptoms.  If no acute changes then it is anticipated that he can proceed as planned.  [facility]/Anesthesiology Phone [phone] 06/16/2013 4:24 PM

## 2013-06-16 ENCOUNTER — Encounter (HOSPITAL_COMMUNITY): Payer: Self-pay

## 2013-06-16 ENCOUNTER — Telehealth: Payer: Self-pay | Admitting: Pulmonary Disease

## 2013-06-16 ENCOUNTER — Encounter (HOSPITAL_COMMUNITY)
Admission: RE | Admit: 2013-06-16 | Discharge: 2013-06-16 | Disposition: A | Discharge status: Home / Self Care | Payer: Medicare Other | Source: Ambulatory Visit | Attending: Neurosurgery | Admitting: Neurosurgery

## 2013-06-16 ENCOUNTER — Telehealth: Payer: Self-pay | Admitting: Interventional Cardiology

## 2013-06-16 ENCOUNTER — Ambulatory Visit (HOSPITAL_COMMUNITY)
Admission: RE | Admit: 2013-06-16 | Discharge: 2013-06-16 | Disposition: A | Discharge status: Home / Self Care | Payer: Medicare Other | Source: Ambulatory Visit | Attending: Anesthesiology | Admitting: Anesthesiology

## 2013-06-16 DIAGNOSIS — F3289 Other specified depressive episodes: Secondary | ICD-10-CM | POA: Insufficient documentation

## 2013-06-16 DIAGNOSIS — F329 Major depressive disorder, single episode, unspecified: Secondary | ICD-10-CM | POA: Insufficient documentation

## 2013-06-16 DIAGNOSIS — Z01812 Encounter for preprocedural laboratory examination: Secondary | ICD-10-CM | POA: Diagnosis not present

## 2013-06-16 DIAGNOSIS — Z87891 Personal history of nicotine dependence: Secondary | ICD-10-CM | POA: Insufficient documentation

## 2013-06-16 DIAGNOSIS — Z9861 Coronary angioplasty status: Secondary | ICD-10-CM | POA: Diagnosis not present

## 2013-06-16 DIAGNOSIS — I10 Essential (primary) hypertension: Secondary | ICD-10-CM | POA: Diagnosis not present

## 2013-06-16 DIAGNOSIS — Z01818 Encounter for other preprocedural examination: Secondary | ICD-10-CM | POA: Diagnosis not present

## 2013-06-16 DIAGNOSIS — Z8546 Personal history of malignant neoplasm of prostate: Secondary | ICD-10-CM | POA: Diagnosis not present

## 2013-06-16 DIAGNOSIS — J438 Other emphysema: Secondary | ICD-10-CM | POA: Diagnosis not present

## 2013-06-16 DIAGNOSIS — I2510 Atherosclerotic heart disease of native coronary artery without angina pectoris: Secondary | ICD-10-CM | POA: Diagnosis not present

## 2013-06-16 DIAGNOSIS — Z8673 Personal history of transient ischemic attack (TIA), and cerebral infarction without residual deficits: Secondary | ICD-10-CM | POA: Insufficient documentation

## 2013-06-16 HISTORY — DX: Gastro-esophageal reflux disease without esophagitis: K21.9

## 2013-06-16 HISTORY — DX: Anxiety disorder, unspecified: F41.9

## 2013-06-16 HISTORY — DX: Shortness of breath: R06.02

## 2013-06-16 HISTORY — DX: Personal history of colon polyps, unspecified: Z86.0100

## 2013-06-16 HISTORY — DX: Personal history of other diseases of the nervous system and sense organs: Z86.69

## 2013-06-16 HISTORY — DX: Unspecified asthma, uncomplicated: J45.909

## 2013-06-16 HISTORY — DX: Dorsalgia, unspecified: M54.9

## 2013-06-16 HISTORY — DX: Personal history of colonic polyps: Z86.010

## 2013-06-16 HISTORY — DX: Emotional lability: R45.86

## 2013-06-16 HISTORY — DX: Other chronic pain: G89.29

## 2013-06-16 HISTORY — DX: Insomnia, unspecified: G47.00

## 2013-06-16 HISTORY — DX: Atherosclerotic heart disease of native coronary artery without angina pectoris: I25.10

## 2013-06-16 HISTORY — DX: Constipation, unspecified: K59.00

## 2013-06-16 HISTORY — DX: Personal history of other diseases of the respiratory system: Z87.09

## 2013-06-16 LAB — CBC
HEMATOCRIT: 34.6 % — AB (ref 39.0–52.0)
Hemoglobin: 11.7 g/dL — ABNORMAL LOW (ref 13.0–17.0)
MCH: 28.6 pg (ref 26.0–34.0)
MCHC: 33.8 g/dL (ref 30.0–36.0)
MCV: 84.6 fL (ref 78.0–100.0)
Platelets: 312 10*3/uL (ref 150–400)
RBC: 4.09 MIL/uL — ABNORMAL LOW (ref 4.22–5.81)
RDW: 15.2 % (ref 11.5–15.5)
WBC: 7 10*3/uL (ref 4.0–10.5)

## 2013-06-16 LAB — BASIC METABOLIC PANEL
BUN: 17 mg/dL (ref 6–23)
CHLORIDE: 96 meq/L (ref 96–112)
CO2: 24 meq/L (ref 19–32)
Calcium: 9.3 mg/dL (ref 8.4–10.5)
Creatinine, Ser: 0.68 mg/dL (ref 0.50–1.35)
GFR calc Af Amer: >90 mL/min (ref >90)
GFR calc non Af Amer: >90 mL/min (ref >90)
Glucose, Bld: 88 mg/dL (ref 70–99)
Potassium: 4.5 mEq/L (ref 3.7–5.3)
Sodium: 135 mEq/L — ABNORMAL LOW (ref 137–147)

## 2013-06-16 LAB — SURGICAL PCR SCREEN
MRSA, PCR: NEGATIVE
STAPHYLOCOCCUS AUREUS: NEGATIVE

## 2013-06-16 NOTE — Telephone Encounter (Signed)
Tried calling mobile # listed x3 and received message no longer in service Called home # and spoke with spouse. Made aware we had no samples.  Nothing further needed

## 2013-06-16 NOTE — Pre-Procedure Instructions (Signed)
Malik Zuniga  06/16/2013   Your procedure is scheduled on:  Tues, May 12 @ 10:45 AM  Report to [facility]  at 8:45 AM.  Call this number if you have problems the morning of surgery: [phone]   Remember:   Do not eat food or drink liquids after midnight.   Take these medicines the morning of surgery with A SIP OF WATER: Albuterol<Bring Your Inhaler With You>,Abilify(Aripiprazole),Symbicort(Budesonide),Valium(Diazepam),Cymbalta(Duloxetine),Flonase(Fluticasone),Metoprolol(Toprol),Omeprazole(Prilosec),Pain Pill(if needed),Primidone(Mysoline),and Spiriva(Tiotropium)              No Goody's,BC's,Aleve,Ibuprofen,Aspirin,Fish Oil,or any Herbal Medications   Do not wear jewelry  Do not wear lotions, powders, or colognes. You may wear deodorant.  Men may shave face and neck.  Do not bring valuables to the hospital.  [facility] is not responsible                  for any belongings or valuables.               Contacts, dentures or bridgework may not be worn into surgery.  Leave suitcase in the car. After surgery it may be brought to your room.  For patients admitted to the hospital, discharge time is determined by your                treatment team.                 Special Instructions:  [place] - Preparing for Surgery  Before surgery, you can play an important role.  Because skin is not sterile, your skin needs to be as free of germs as possible.  You can reduce the number of germs on you skin by washing with CHG (chlorahexidine gluconate) soap before surgery.  CHG is an antiseptic cleaner which kills germs and bonds with the skin to continue killing germs even after washing.  Please DO NOT use if you have an allergy to CHG or antibacterial soaps.  If your skin becomes reddened/irritated stop using the CHG and inform your nurse when you arrive at Short Stay.  Do not shave (including legs and underarms) for at least 48 hours prior to the first CHG shower.  You may shave your  face.  Please follow these instructions carefully:   1.  Shower with CHG Soap the night before surgery and the                                morning of Surgery.  2.  If you choose to wash your hair, wash your hair first as usual with your       normal shampoo.  3.  After you shampoo, rinse your hair and body thoroughly to remove the                      Shampoo.  4.  Use CHG as you would any other liquid soap.  You can apply chg directly       to the skin and wash gently with scrungie or a clean washcloth.  5.  Apply the CHG Soap to your body ONLY FROM THE NECK DOWN.        Do not use on open wounds or open sores.  Avoid contact with your eyes,       ears, mouth and genitals (private parts).  Wash genitals (private parts)       with your normal soap.  6.  Wash thoroughly, paying special attention to the area where your surgery        will be performed.  7.  Thoroughly rinse your body with warm water from the neck down.  8.  DO NOT shower/wash with your normal soap after using and rinsing off       the CHG Soap.  9.  Pat yourself dry with a clean towel.            10.  Wear clean pajamas.            11.  Place clean sheets on your bed the night of your first shower and do not        sleep with pets.  Day of Surgery  Do not apply any lotions/deoderants the morning of surgery.  Please wear clean clothes to the hospital/surgery center.     Please read over the following fact sheets that you were given: Pain Booklet, Coughing and Deep Breathing, Blood Transfusion Information, MRSA Information and Surgical Site Infection Prevention

## 2013-06-16 NOTE — Progress Notes (Addendum)
Dr.Henry Tamala Julian is cardiologist with last visit over a yr ago  EKG in chart from 06-23-12 and 07-22-12  Echo in chart from 07-2011  Stress test in chart from 12/2009  Medical Md is Dr.Katherine tabori  Heart cath done in 2000  Denies CXR in past yr3   Pulmonologist is Dr.Clance with last visit in epic from 04/2013

## 2013-06-16 NOTE — Telephone Encounter (Signed)
New message     Need clearance for back surgery---clearance was faxed 06-01-13 and again this am.  Please fax to [phone].

## 2013-06-16 NOTE — Progress Notes (Signed)
Hasn't taken nitroglycerin in months and says maybe longer

## 2013-06-20 MED ORDER — CEFAZOLIN SODIUM-DEXTROSE 2-3 GM-% IV SOLR
2.0000 g | INTRAVENOUS | Status: AC
  Administered 2013-06-21: 2 g via INTRAVENOUS
  Filled 2013-06-20: qty 50

## 2013-06-21 ENCOUNTER — Encounter (HOSPITAL_COMMUNITY): Payer: Medicare Other | Admitting: Vascular Surgery

## 2013-06-21 ENCOUNTER — Encounter (HOSPITAL_COMMUNITY): Payer: Self-pay | Admitting: Anesthesiology

## 2013-06-21 ENCOUNTER — Encounter (HOSPITAL_COMMUNITY)
Admission: RE | Disposition: A | Discharge status: Home / Self Care | Payer: Self-pay | Source: Ambulatory Visit | Attending: Neurosurgery

## 2013-06-21 ENCOUNTER — Inpatient Hospital Stay (HOSPITAL_COMMUNITY)
Admission: RE | Admit: 2013-06-21 | Discharge: 2013-06-22 | DRG: 460 | Disposition: A | Discharge status: Home / Self Care | Payer: Medicare Other | Source: Ambulatory Visit | Attending: Neurosurgery | Admitting: Neurosurgery

## 2013-06-21 ENCOUNTER — Inpatient Hospital Stay (HOSPITAL_COMMUNITY): Payer: Medicare Other | Admitting: Anesthesiology

## 2013-06-21 DIAGNOSIS — Z7982 Long term (current) use of aspirin: Secondary | ICD-10-CM | POA: Diagnosis not present

## 2013-06-21 DIAGNOSIS — Z9981 Dependence on supplemental oxygen: Secondary | ICD-10-CM | POA: Diagnosis not present

## 2013-06-21 DIAGNOSIS — G8929 Other chronic pain: Secondary | ICD-10-CM | POA: Diagnosis present

## 2013-06-21 DIAGNOSIS — Y831 Surgical operation with implant of artificial internal device as the cause of abnormal reaction of the patient, or of later complication, without mention of misadventure at the time of the procedure: Secondary | ICD-10-CM | POA: Diagnosis present

## 2013-06-21 DIAGNOSIS — Z981 Arthrodesis status: Secondary | ICD-10-CM | POA: Diagnosis not present

## 2013-06-21 DIAGNOSIS — I10 Essential (primary) hypertension: Secondary | ICD-10-CM | POA: Diagnosis present

## 2013-06-21 DIAGNOSIS — I252 Old myocardial infarction: Secondary | ICD-10-CM | POA: Diagnosis not present

## 2013-06-21 DIAGNOSIS — Z8546 Personal history of malignant neoplasm of prostate: Secondary | ICD-10-CM

## 2013-06-21 DIAGNOSIS — J45909 Unspecified asthma, uncomplicated: Secondary | ICD-10-CM | POA: Diagnosis present

## 2013-06-21 DIAGNOSIS — G4700 Insomnia, unspecified: Secondary | ICD-10-CM | POA: Diagnosis present

## 2013-06-21 DIAGNOSIS — F411 Generalized anxiety disorder: Secondary | ICD-10-CM | POA: Diagnosis present

## 2013-06-21 DIAGNOSIS — I2510 Atherosclerotic heart disease of native coronary artery without angina pectoris: Secondary | ICD-10-CM | POA: Diagnosis present

## 2013-06-21 DIAGNOSIS — K219 Gastro-esophageal reflux disease without esophagitis: Secondary | ICD-10-CM | POA: Diagnosis present

## 2013-06-21 DIAGNOSIS — Z9861 Coronary angioplasty status: Secondary | ICD-10-CM | POA: Diagnosis not present

## 2013-06-21 DIAGNOSIS — K5900 Constipation, unspecified: Secondary | ICD-10-CM | POA: Diagnosis present

## 2013-06-21 DIAGNOSIS — S22009A Unspecified fracture of unspecified thoracic vertebra, initial encounter for closed fracture: Secondary | ICD-10-CM | POA: Diagnosis not present

## 2013-06-21 DIAGNOSIS — IMO0002 Reserved for concepts with insufficient information to code with codable children: Secondary | ICD-10-CM | POA: Diagnosis not present

## 2013-06-21 DIAGNOSIS — S22000A Wedge compression fracture of unspecified thoracic vertebra, initial encounter for closed fracture: Secondary | ICD-10-CM | POA: Diagnosis present

## 2013-06-21 DIAGNOSIS — T84498A Other mechanical complication of other internal orthopedic devices, implants and grafts, initial encounter: Secondary | ICD-10-CM | POA: Diagnosis not present

## 2013-06-21 DIAGNOSIS — M8448XA Pathological fracture, other site, initial encounter for fracture: Secondary | ICD-10-CM | POA: Diagnosis not present

## 2013-06-21 DIAGNOSIS — E785 Hyperlipidemia, unspecified: Secondary | ICD-10-CM | POA: Diagnosis present

## 2013-06-21 DIAGNOSIS — Z87891 Personal history of nicotine dependence: Secondary | ICD-10-CM | POA: Diagnosis not present

## 2013-06-21 DIAGNOSIS — M549 Dorsalgia, unspecified: Secondary | ICD-10-CM | POA: Diagnosis not present

## 2013-06-21 SURGERY — POSTERIOR LUMBAR FUSION 1 WITH HARDWARE REMOVAL
Anesthesia: General | Site: Back

## 2013-06-21 MED ORDER — BISACODYL 10 MG RE SUPP: 10.0000 mg | Freq: Every day | RECTAL | Status: DC | PRN

## 2013-06-21 MED ORDER — BUPIVACAINE HCL (PF) 0.25 % IJ SOLN
INTRAMUSCULAR | Status: DC | PRN
  Administered 2013-06-21: 20 mL

## 2013-06-21 MED ORDER — CYCLOBENZAPRINE HCL 10 MG PO TABS
10.0000 mg | ORAL_TABLET | Freq: Three times a day (TID) | ORAL | Status: DC | PRN
  Administered 2013-06-21: 10 mg via ORAL
  Filled 2013-06-21: qty 1

## 2013-06-21 MED ORDER — GLYCOPYRROLATE 0.2 MG/ML IJ SOLN
INTRAMUSCULAR | Status: AC
  Filled 2013-06-21: qty 3

## 2013-06-21 MED ORDER — OXYCODONE-ACETAMINOPHEN 5-325 MG PO TABS
2.0000 | ORAL_TABLET | ORAL | Status: DC | PRN
  Administered 2013-06-21 – 2013-06-22 (×3): 2 via ORAL
  Filled 2013-06-21 (×2): qty 2

## 2013-06-21 MED ORDER — METOPROLOL SUCCINATE ER 50 MG PO TB24
50.0000 mg | ORAL_TABLET | Freq: Every day | ORAL | Status: DC
  Administered 2013-06-22: 50 mg via ORAL
  Filled 2013-06-21: qty 1

## 2013-06-21 MED ORDER — THROMBIN 20000 UNITS EX SOLR
CUTANEOUS | Status: DC | PRN
  Administered 2013-06-21: 12:00:00 via TOPICAL

## 2013-06-21 MED ORDER — DULOXETINE HCL 60 MG PO CPEP
60.0000 mg | ORAL_CAPSULE | Freq: Every day | ORAL | Status: DC
  Administered 2013-06-22: 60 mg via ORAL
  Filled 2013-06-21: qty 1

## 2013-06-21 MED ORDER — FENTANYL CITRATE 0.05 MG/ML IJ SOLN
INTRAMUSCULAR | Status: AC
  Filled 2013-06-21: qty 5

## 2013-06-21 MED ORDER — PRIMIDONE 250 MG PO TABS
250.0000 mg | ORAL_TABLET | Freq: Two times a day (BID) | ORAL | Status: DC
  Administered 2013-06-21 – 2013-06-22 (×2): 250 mg via ORAL
  Filled 2013-06-21 (×3): qty 1

## 2013-06-21 MED ORDER — POLYETHYLENE GLYCOL 3350 17 G PO PACK
17.0000 g | PACK | Freq: Two times a day (BID) | ORAL | Status: DC
  Administered 2013-06-22: 17 g via ORAL
  Filled 2013-06-21 (×3): qty 1

## 2013-06-21 MED ORDER — ONDANSETRON HCL 4 MG/2ML IJ SOLN
INTRAMUSCULAR | Status: AC
  Filled 2013-06-21: qty 2

## 2013-06-21 MED ORDER — FENTANYL CITRATE 0.05 MG/ML IJ SOLN
INTRAMUSCULAR | Status: DC | PRN
  Administered 2013-06-21: 12.5 ug via INTRAVENOUS
  Administered 2013-06-21: 50 ug via INTRAVENOUS
  Administered 2013-06-21: 12.5 ug via INTRAVENOUS
  Administered 2013-06-21: 25 ug via INTRAVENOUS

## 2013-06-21 MED ORDER — PHENYLEPHRINE HCL 10 MG/ML IJ SOLN
10.0000 mg | INTRAMUSCULAR | Status: DC | PRN
  Administered 2013-06-21: 50 ug/min via INTRAVENOUS

## 2013-06-21 MED ORDER — OXYCODONE-ACETAMINOPHEN 5-325 MG PO TABS
1.0000 | ORAL_TABLET | ORAL | Status: DC | PRN
  Administered 2013-06-21: 1 via ORAL
  Filled 2013-06-21: qty 1
  Filled 2013-06-21: qty 2

## 2013-06-21 MED ORDER — 0.9 % SODIUM CHLORIDE (POUR BTL) OPTIME
TOPICAL | Status: DC | PRN
  Administered 2013-06-21: 1000 mL

## 2013-06-21 MED ORDER — ACETAMINOPHEN 325 MG PO TABS: 650.0000 mg | ORAL_TABLET | ORAL | Status: DC | PRN

## 2013-06-21 MED ORDER — ALUM & MAG HYDROXIDE-SIMETH 200-200-20 MG/5ML PO SUSP: 30.0000 mL | Freq: Four times a day (QID) | ORAL | Status: DC | PRN

## 2013-06-21 MED ORDER — PROPOFOL 10 MG/ML IV BOLUS
INTRAVENOUS | Status: AC
  Filled 2013-06-21: qty 20

## 2013-06-21 MED ORDER — ROCURONIUM BROMIDE 100 MG/10ML IV SOLN
INTRAVENOUS | Status: DC | PRN
  Administered 2013-06-21: 40 mg via INTRAVENOUS

## 2013-06-21 MED ORDER — FLUTICASONE PROPIONATE 50 MCG/ACT NA SUSP
2.0000 | Freq: Every day | NASAL | Status: DC
  Administered 2013-06-21 – 2013-06-22 (×2): 2 via NASAL
  Filled 2013-06-21: qty 16

## 2013-06-21 MED ORDER — OXYCODONE HCL 5 MG PO TABS
10.0000 mg | ORAL_TABLET | ORAL | Status: DC | PRN
  Administered 2013-06-21 – 2013-06-22 (×2): 10 mg via ORAL
  Filled 2013-06-21 (×2): qty 2

## 2013-06-21 MED ORDER — PROPOFOL 10 MG/ML IV BOLUS
INTRAVENOUS | Status: DC | PRN
Start: 2013-06-21 — End: 2013-06-21
  Administered 2013-06-21: 150 mg via INTRAVENOUS

## 2013-06-21 MED ORDER — SODIUM CHLORIDE 0.9 % IV SOLN: 250.0000 mL | INTRAVENOUS | Status: DC

## 2013-06-21 MED ORDER — METOCLOPRAMIDE HCL 5 MG/ML IJ SOLN: 10.0000 mg | Freq: Once | INTRAMUSCULAR | Status: DC | PRN

## 2013-06-21 MED ORDER — LACTATED RINGERS IV SOLN
INTRAVENOUS | Status: DC | PRN
  Administered 2013-06-21: 11:00:00 via INTRAVENOUS

## 2013-06-21 MED ORDER — DIAZEPAM 5 MG PO TABS
5.0000 mg | ORAL_TABLET | Freq: Three times a day (TID) | ORAL | Status: DC | PRN
  Administered 2013-06-21 – 2013-06-22 (×3): 5 mg via ORAL
  Filled 2013-06-21 (×3): qty 1

## 2013-06-21 MED ORDER — OXYCODONE-ACETAMINOPHEN 10-325 MG PO TABS: 2.0000 | ORAL_TABLET | ORAL | Status: DC | PRN

## 2013-06-21 MED ORDER — HYDROMORPHONE HCL PF 1 MG/ML IJ SOLN
INTRAMUSCULAR | Status: AC
  Administered 2013-06-21: 0.5 mg via INTRAVENOUS
  Filled 2013-06-21: qty 1

## 2013-06-21 MED ORDER — LOSARTAN POTASSIUM 50 MG PO TABS
50.0000 mg | ORAL_TABLET | Freq: Every day | ORAL | Status: DC
  Administered 2013-06-22: 50 mg via ORAL
  Filled 2013-06-21 (×2): qty 1

## 2013-06-21 MED ORDER — SODIUM CHLORIDE 0.9 % IJ SOLN
3.0000 mL | INTRAMUSCULAR | Status: DC | PRN
Start: 2013-06-21 — End: 2013-06-22

## 2013-06-21 MED ORDER — SENNA 8.6 MG PO TABS
1.0000 | ORAL_TABLET | Freq: Two times a day (BID) | ORAL | Status: DC
  Administered 2013-06-21 – 2013-06-22 (×2): 8.6 mg via ORAL
  Filled 2013-06-21 (×2): qty 1

## 2013-06-21 MED ORDER — PHENOL 1.4 % MT LIQD: 1.0000 | OROMUCOSAL | Status: DC | PRN

## 2013-06-21 MED ORDER — NEOSTIGMINE METHYLSULFATE 10 MG/10ML IV SOLN
INTRAVENOUS | Status: AC
  Filled 2013-06-21: qty 1

## 2013-06-21 MED ORDER — PANTOPRAZOLE SODIUM 40 MG PO TBEC
80.0000 mg | DELAYED_RELEASE_TABLET | Freq: Every day | ORAL | Status: DC
  Administered 2013-06-22: 80 mg via ORAL
  Filled 2013-06-21: qty 2

## 2013-06-21 MED ORDER — MONTELUKAST SODIUM 10 MG PO TABS
10.0000 mg | ORAL_TABLET | Freq: Every day | ORAL | Status: DC
  Administered 2013-06-21: 10 mg via ORAL
  Filled 2013-06-21 (×2): qty 1

## 2013-06-21 MED ORDER — LIDOCAINE HCL (CARDIAC) 20 MG/ML IV SOLN
INTRAVENOUS | Status: AC
  Filled 2013-06-21: qty 5

## 2013-06-21 MED ORDER — FLEET ENEMA 7-19 GM/118ML RE ENEM: 1.0000 | ENEMA | Freq: Once | RECTAL | Status: AC | PRN

## 2013-06-21 MED ORDER — CEFAZOLIN SODIUM 1-5 GM-% IV SOLN
1.0000 g | Freq: Three times a day (TID) | INTRAVENOUS | Status: AC
  Administered 2013-06-21 – 2013-06-22 (×2): 1 g via INTRAVENOUS
  Filled 2013-06-21 (×2): qty 50

## 2013-06-21 MED ORDER — MIDAZOLAM HCL 2 MG/2ML IJ SOLN
INTRAMUSCULAR | Status: AC
  Filled 2013-06-21: qty 2

## 2013-06-21 MED ORDER — BUDESONIDE-FORMOTEROL FUMARATE 160-4.5 MCG/ACT IN AERO
2.0000 | INHALATION_SPRAY | Freq: Two times a day (BID) | RESPIRATORY_TRACT | Status: DC
  Administered 2013-06-21 – 2013-06-22 (×2): 2 via RESPIRATORY_TRACT
  Filled 2013-06-21 (×2): qty 6

## 2013-06-21 MED ORDER — TIOTROPIUM BROMIDE MONOHYDRATE 18 MCG IN CAPS
18.0000 ug | ORAL_CAPSULE | Freq: Every day | RESPIRATORY_TRACT | Status: DC
  Administered 2013-06-22: 18 ug via RESPIRATORY_TRACT
  Filled 2013-06-21: qty 5

## 2013-06-21 MED ORDER — ALBUTEROL SULFATE HFA 108 (90 BASE) MCG/ACT IN AERS: 2.0000 | INHALATION_SPRAY | RESPIRATORY_TRACT | Status: DC | PRN

## 2013-06-21 MED ORDER — ALBUTEROL SULFATE (2.5 MG/3ML) 0.083% IN NEBU: 2.5000 mg | INHALATION_SOLUTION | RESPIRATORY_TRACT | Status: DC | PRN

## 2013-06-21 MED ORDER — DIVALPROEX SODIUM ER 500 MG PO TB24
1000.0000 mg | ORAL_TABLET | Freq: Every day | ORAL | Status: DC
  Administered 2013-06-21: 1000 mg via ORAL
  Filled 2013-06-21 (×2): qty 2

## 2013-06-21 MED ORDER — SIMVASTATIN 40 MG PO TABS
40.0000 mg | ORAL_TABLET | Freq: Every day | ORAL | Status: DC
  Administered 2013-06-21: 40 mg via ORAL
  Filled 2013-06-21 (×2): qty 1

## 2013-06-21 MED ORDER — HYDROCODONE-ACETAMINOPHEN 5-325 MG PO TABS: 1.0000 | ORAL_TABLET | ORAL | Status: DC | PRN

## 2013-06-21 MED ORDER — MENTHOL 3 MG MT LOZG: 1.0000 | LOZENGE | OROMUCOSAL | Status: DC | PRN

## 2013-06-21 MED ORDER — PHENYLEPHRINE HCL 10 MG/ML IJ SOLN
INTRAMUSCULAR | Status: DC | PRN
  Administered 2013-06-21: 80 ug via INTRAVENOUS
  Administered 2013-06-21: 40 ug via INTRAVENOUS

## 2013-06-21 MED ORDER — HYDROMORPHONE HCL PF 1 MG/ML IJ SOLN
0.2500 mg | INTRAMUSCULAR | Status: DC | PRN
  Administered 2013-06-21: 0.5 mg via INTRAVENOUS

## 2013-06-21 MED ORDER — ONDANSETRON HCL 4 MG/2ML IJ SOLN
INTRAMUSCULAR | Status: DC | PRN
  Administered 2013-06-21: 4 mg via INTRAVENOUS

## 2013-06-21 MED ORDER — SODIUM CHLORIDE 0.9 % IJ SOLN
3.0000 mL | Freq: Two times a day (BID) | INTRAMUSCULAR | Status: DC
  Administered 2013-06-21 – 2013-06-22 (×2): 3 mL via INTRAVENOUS

## 2013-06-21 MED ORDER — LACTATED RINGERS IV SOLN
INTRAVENOUS | Status: DC
  Administered 2013-06-21: 09:00:00 via INTRAVENOUS

## 2013-06-21 MED ORDER — NEOSTIGMINE METHYLSULFATE 10 MG/10ML IV SOLN
INTRAVENOUS | Status: DC | PRN
  Administered 2013-06-21: 4 mg via INTRAVENOUS

## 2013-06-21 MED ORDER — ARIPIPRAZOLE 5 MG PO TABS
5.0000 mg | ORAL_TABLET | Freq: Every day | ORAL | Status: DC
  Administered 2013-06-22: 5 mg via ORAL
  Filled 2013-06-21: qty 1

## 2013-06-21 MED ORDER — OXYCODONE HCL 5 MG PO TABS: 5.0000 mg | ORAL_TABLET | Freq: Once | ORAL | Status: DC | PRN

## 2013-06-21 MED ORDER — ACETAMINOPHEN 650 MG RE SUPP: 650.0000 mg | RECTAL | Status: DC | PRN

## 2013-06-21 MED ORDER — ONDANSETRON HCL 4 MG/2ML IJ SOLN: 4.0000 mg | INTRAMUSCULAR | Status: DC | PRN

## 2013-06-21 MED ORDER — BACITRACIN 50000 UNITS IM SOLR
INTRAMUSCULAR | Status: DC | PRN
  Administered 2013-06-21: 12:00:00

## 2013-06-21 MED ORDER — POLYETHYLENE GLYCOL 3350 17 G PO PACK
17.0000 g | PACK | Freq: Every day | ORAL | Status: DC | PRN
  Filled 2013-06-21: qty 1

## 2013-06-21 MED ORDER — ROCURONIUM BROMIDE 50 MG/5ML IV SOLN
INTRAVENOUS | Status: AC
  Filled 2013-06-21: qty 1

## 2013-06-21 MED ORDER — NITROGLYCERIN 0.4 MG SL SUBL: 0.4000 mg | SUBLINGUAL_TABLET | SUBLINGUAL | Status: DC | PRN

## 2013-06-21 MED ORDER — GLYCOPYRROLATE 0.2 MG/ML IJ SOLN
INTRAMUSCULAR | Status: DC | PRN
  Administered 2013-06-21: 0.6 mg via INTRAVENOUS

## 2013-06-21 MED ORDER — LIDOCAINE HCL (CARDIAC) 20 MG/ML IV SOLN
INTRAVENOUS | Status: DC | PRN
  Administered 2013-06-21: 60 mg via INTRAVENOUS
  Administered 2013-06-21: 10 mg via INTRAVENOUS

## 2013-06-21 MED ORDER — OXYCODONE HCL 5 MG PO TABS
5.0000 mg | ORAL_TABLET | Freq: Once | ORAL | Status: AC
  Administered 2013-06-21: 5 mg via ORAL
  Filled 2013-06-21: qty 1

## 2013-06-21 MED ORDER — HYDROMORPHONE HCL PF 1 MG/ML IJ SOLN
0.5000 mg | INTRAMUSCULAR | Status: DC | PRN
  Administered 2013-06-21 – 2013-06-22 (×2): 1 mg via INTRAVENOUS
  Filled 2013-06-21 (×2): qty 1

## 2013-06-21 MED ORDER — LOSARTAN POTASSIUM-HCTZ 50-12.5 MG PO TABS
1.0000 | ORAL_TABLET | Freq: Every day | ORAL | Status: DC
Start: 2013-06-21 — End: 2013-06-21

## 2013-06-21 MED ORDER — OXYCODONE-ACETAMINOPHEN 5-325 MG PO TABS
1.0000 | ORAL_TABLET | Freq: Once | ORAL | Status: AC
  Administered 2013-06-21: 1 via ORAL
  Filled 2013-06-21 (×2): qty 1

## 2013-06-21 MED ORDER — HYDROCHLOROTHIAZIDE 12.5 MG PO CAPS
12.5000 mg | ORAL_CAPSULE | Freq: Every day | ORAL | Status: DC
  Administered 2013-06-22: 12.5 mg via ORAL
  Filled 2013-06-21 (×2): qty 1

## 2013-06-21 MED ORDER — OXYCODONE HCL 5 MG/5ML PO SOLN: 5.0000 mg | Freq: Once | ORAL | Status: DC | PRN

## 2013-06-21 SURGICAL SUPPLY — 64 items
ADH SKN CLS APL DERMABOND .7 (GAUZE/BANDAGES/DRESSINGS) ×1
ADH SKN CLS LQ APL DERMABOND (GAUZE/BANDAGES/DRESSINGS) ×1
APL SKNCLS STERI-STRIP NONHPOA (GAUZE/BANDAGES/DRESSINGS) ×1
BAG DECANTER FOR FLEXI CONT (MISCELLANEOUS) ×3 IMPLANT
BENZOIN TINCTURE PRP APPL 2/3 (GAUZE/BANDAGES/DRESSINGS) ×3 IMPLANT
BLADE 10 SAFETY STRL DISP (BLADE) ×1 IMPLANT
BLADE SURG ROTATE 9660 (MISCELLANEOUS) IMPLANT
BRUSH SCRUB EZ PLAIN DRY (MISCELLANEOUS) ×3 IMPLANT
BUR MATCHSTICK NEURO 3.0 LAGG (BURR) ×3 IMPLANT
CANISTER SUCT 3000ML (MISCELLANEOUS) ×3 IMPLANT
CLOSURE WOUND 1/2 X4 (GAUZE/BANDAGES/DRESSINGS) ×2
CONT SPEC 4OZ CLIKSEAL STRL BL (MISCELLANEOUS) ×6 IMPLANT
COVER BACK TABLE 24X17X13 BIG (DRAPES) IMPLANT
COVER TABLE BACK 60X90 (DRAPES) ×3 IMPLANT
DECANTER SPIKE VIAL GLASS SM (MISCELLANEOUS) ×3 IMPLANT
DERMABOND ADHESIVE PROPEN (GAUZE/BANDAGES/DRESSINGS) ×2
DERMABOND ADVANCED (GAUZE/BANDAGES/DRESSINGS) ×2
DERMABOND ADVANCED .7 DNX12 (GAUZE/BANDAGES/DRESSINGS) ×1 IMPLANT
DERMABOND ADVANCED .7 DNX6 (GAUZE/BANDAGES/DRESSINGS) IMPLANT
DRAPE C-ARM 42X72 X-RAY (DRAPES) ×6 IMPLANT
DRAPE LAPAROTOMY 100X72X124 (DRAPES) ×3 IMPLANT
DRAPE POUCH INSTRU U-SHP 10X18 (DRAPES) ×3 IMPLANT
DRAPE PROXIMA HALF (DRAPES) IMPLANT
DRAPE SURG 17X23 STRL (DRAPES) ×12 IMPLANT
DURAPREP 26ML APPLICATOR (WOUND CARE) ×3 IMPLANT
ELECT REM PT RETURN 9FT ADLT (ELECTROSURGICAL) ×3
ELECTRODE REM PT RTRN 9FT ADLT (ELECTROSURGICAL) ×1 IMPLANT
EVACUATOR 1/8 PVC DRAIN (DRAIN) ×3 IMPLANT
GAUZE SPONGE 4X4 16PLY XRAY LF (GAUZE/BANDAGES/DRESSINGS) IMPLANT
GLOVE BIOGEL PI IND STRL 7.0 (GLOVE) IMPLANT
GLOVE BIOGEL PI INDICATOR 7.0 (GLOVE) ×2
GLOVE ECLIPSE 8.5 STRL (GLOVE) ×2 IMPLANT
GLOVE ECLIPSE 9.0 STRL (GLOVE) ×2 IMPLANT
GLOVE EXAM NITRILE LRG STRL (GLOVE) IMPLANT
GLOVE EXAM NITRILE MD LF STRL (GLOVE) IMPLANT
GLOVE EXAM NITRILE XL STR (GLOVE) IMPLANT
GLOVE EXAM NITRILE XS STR PU (GLOVE) IMPLANT
GLOVE OPTIFIT SS 6.5 STRL BRWN (GLOVE) ×6 IMPLANT
GOWN BRE IMP SLV AUR LG STRL (GOWN DISPOSABLE) IMPLANT
GOWN BRE IMP SLV AUR XL STRL (GOWN DISPOSABLE) ×2 IMPLANT
GOWN STRL REIN 2XL LVL4 (GOWN DISPOSABLE) IMPLANT
GOWN STRL REUS W/ TWL LRG LVL3 (GOWN DISPOSABLE) IMPLANT
GOWN STRL REUS W/ TWL XL LVL3 (GOWN DISPOSABLE) IMPLANT
GOWN STRL REUS W/TWL LRG LVL3 (GOWN DISPOSABLE) ×6
GOWN STRL REUS W/TWL XL LVL3 (GOWN DISPOSABLE) ×3
KIT BASIN OR (CUSTOM PROCEDURE TRAY) ×3 IMPLANT
KIT ROOM TURNOVER OR (KITS) ×3 IMPLANT
NEEDLE HYPO 22GX1.5 SAFETY (NEEDLE) ×3 IMPLANT
NS IRRIG 1000ML POUR BTL (IV SOLUTION) ×3 IMPLANT
PACK LAMINECTOMY NEURO (CUSTOM PROCEDURE TRAY) ×3 IMPLANT
SPONGE GAUZE 4X4 12PLY (GAUZE/BANDAGES/DRESSINGS) ×3 IMPLANT
SPONGE SURGIFOAM ABS GEL 100 (HEMOSTASIS) ×3 IMPLANT
STRIP BIOACTIVE VITOSS 25X100X (Neuro Prosthesis/Implant) ×2 IMPLANT
STRIP CLOSURE SKIN 1/2X4 (GAUZE/BANDAGES/DRESSINGS) ×4 IMPLANT
SUT VIC AB 0 CT1 18XCR BRD8 (SUTURE) ×2 IMPLANT
SUT VIC AB 0 CT1 8-18 (SUTURE) ×6
SUT VIC AB 2-0 CT1 18 (SUTURE) ×3 IMPLANT
SUT VIC AB 3-0 SH 8-18 (SUTURE) ×6 IMPLANT
SYR 20ML ECCENTRIC (SYRINGE) ×3 IMPLANT
TAPE STRIPS DRAPE STRL (GAUZE/BANDAGES/DRESSINGS) ×2 IMPLANT
TOWEL OR 17X24 6PK STRL BLUE (TOWEL DISPOSABLE) ×3 IMPLANT
TOWEL OR 17X26 10 PK STRL BLUE (TOWEL DISPOSABLE) ×3 IMPLANT
TRAY FOLEY CATH 14FRSI W/METER (CATHETERS) ×3 IMPLANT
WATER STERILE IRR 1000ML POUR (IV SOLUTION) ×3 IMPLANT

## 2013-06-21 NOTE — Progress Notes (Signed)
Dr. Girard Cooter aware of pts BP down to 44-36 systolic, will continue to monitor

## 2013-06-21 NOTE — H&P (Signed)
Malik Zuniga is an 68 y.o. male.   Chief Complaint: Thoracic pain HPI: Patient is 68 year old male status post previous thoracic lumbar fusion who presents with worsening neck thoracic pain. Workup demonstrates evidence of fracture of the T8 and T9 vertebral bodies with some loosening of his posterior hardware. Overall alignment is good. There is no threatening of the spinal canal. The bones appear to be healing well however the patient has pain related to his loosened instrumentation. Plan for a removal of his loosened instrumentation today with augmentation of his fusion.  Past Medical History  Diagnosis Date  . Hyperlipidemia     takes Simvastatin daily  . Tremor   . History of prostate cancer   . On home O2   . Neuromuscular disorder 1998    right carpal tunnel release  . Anemia associated with acute blood loss   . Cancer 2004    prostate  . GERD (gastroesophageal reflux disease)     takes Omeprazole daily  . Hypertension     takes Metoprolol daily as well as Hyzaar  . Constipation     takes Colace daily as well as Miralax  . Anxiety     takes Valium daily  . Depression     takes Cymbalta daily  . Emphysema of lung     Albuterol as needed;Symbicort daily and Singulair at bedtime  . Emphysema   . Myocardial infarction 04/1998  . Coronary artery disease   . Asthma   . Shortness of breath     with exertion  . History of bronchitis   . Aspiration pneumonia 2010  . Headache, chronic daily   . History of migraine     last migraine a couple of days ago;takes Excedrin Migraine  . Chronic back pain     compression fracture  . History of colon polyps   . Mood change     after Brain surgery mood changed and was placed on Depakote  . Insomnia     takes Benadryl nightly    Past Surgical History  Procedure Laterality Date  . Cholecystectomy  1996  . 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 Annette Stable  . Vascular stent  2000    Dr. Rollene Fare; he reports cardiac stent, but  denies stent for PAD 06/15/13  . Hand surgery  1989    crushed thumb, Dr. Fredna Dow  . Ulnar nerve repair  1998    left arm, Dr. Fredna Dow  . Gastrostomy w/ feeding tube  2008    Dr. Watt Climes; only had for a few months  . Prostatectomy  04/2001    removal of prostate cancer, Dr. Janice Norrie  . Coronary angioplasty with stent placement  04/1998  . Carpal tunnel release  1998    right  . Spine surgery    . 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 Back surgery  08/2004; 02/2005; 04/2006; 06/2007; 7/20101    all by Dr. Annette Stable  . Esophagogastroduodenoscopy N/A 07/23/2012    Procedure: ESOPHAGOGASTRODUODENOSCOPY (EGD);  Surgeon: Jeryl Columbia, MD;  Location: [facility] ENDOSCOPY;  Service: Endoscopy;  Laterality: N/A;  buccini /ja  . Colonoscopy      Family History  Problem Relation Age of Onset  . Heart disease Mother   . Cancer Father     brain cancer and prostate cancer    Social History:  reports that he has quit smoking. His smoking use included Cigarettes. He has a 25 pack-year smoking history. He  has never used smokeless tobacco. He reports that he does not drink alcohol or use illicit drugs.  Allergies:  Allergies  Allergen Reactions  . Lisinopril Swelling    ANGIOEDEMA  . Ambien [Zolpidem] Other (See Comments)    Unknown reaction  . Lamictal [Lamotrigine] Other (See Comments)    Weakness/difficulty swallowing    Medications Prior to Admission  Medication Sig Dispense Refill  . acetaminophen (TYLENOL) 325 MG tablet Take 650 mg by mouth every 6 (six) hours as needed. For pain      . albuterol (PROAIR HFA) 108 (90 BASE) MCG/ACT inhaler Inhale 2 puffs into the lungs every 4 (four) hours as needed for wheezing.  1 Inhaler  6  . ARIPiprazole (ABILIFY) 10 MG tablet Take 5 mg by mouth daily.      . Ascorbic Acid (VITAMIN C) 500 MG tablet Take 1,000 mg by mouth daily.       Marland Kitchen aspirin-acetaminophen-caffeine (EXCEDRIN MIGRAINE) 250-250-65 MG per tablet Take 2 tablets by mouth every 6 (six) hours as needed. For  headaches      . budesonide-formoterol (SYMBICORT) 160-4.5 MCG/ACT inhaler Inhale 2 puffs into the lungs 2 (two) times daily.  1 Inhaler  1  . cholecalciferol (VITAMIN D) 1000 UNITS tablet Take 1,000 Units by mouth daily.      . diazepam (VALIUM) 5 MG tablet Take 5 mg by mouth every 8 (eight) hours as needed for anxiety. scheduled      . divalproex (DEPAKOTE) 500 MG 24 hr tablet Take 1,000 mg by mouth at bedtime.       . docusate sodium (COLACE) 100 MG capsule Take 200 mg by mouth at bedtime. Take 1 tablet in the morning and 2 tablets at  bedtime      . DULoxetine (CYMBALTA) 60 MG capsule Take 60 mg by mouth daily.      . fluticasone (FLONASE) 50 MCG/ACT nasal spray Place 2 sprays into the nose daily.      Marland Kitchen losartan-hydrochlorothiazide (HYZAAR) 50-12.5 MG per tablet Take 1 tablet by mouth daily.      . metoprolol succinate (TOPROL-XL) 50 MG 24 hr tablet Take 1 tablet (50 mg total) by mouth daily. Take with or immediately following a meal.  30 tablet  5  . montelukast (SINGULAIR) 10 MG tablet TAKE 1 TABLET (10 MG TOTAL) BY MOUTH AT BEDTIME.  30 tablet  5  . Omega-3 Fatty Acids (FISH OIL) 1000 MG CAPS Take 1 capsule by mouth daily.      Marland Kitchen omeprazole (PRILOSEC) 40 MG capsule Take 40 mg by mouth 2 (two) times daily.       Marland Kitchen OVER THE COUNTER MEDICATION Take 1 tablet by mouth every 4 (four) hours as needed (for nausea. [employer] brand nausea relief).      Marland Kitchen OVER THE COUNTER MEDICATION Take 1 tablet by mouth at bedtime as needed (for sleep. [employer] brand of sleep aid).      Marland Kitchen oxyCODONE-acetaminophen (PERCOCET) 10-325 MG per tablet Take 2 tablets by mouth every 6 (six) hours as needed for pain.       . polyethylene glycol (MIRALAX / GLYCOLAX) packet Take 17 g by mouth 2 (two) times daily. For constipation      . primidone (MYSOLINE) 250 MG tablet Take 250 mg by mouth 2 (two) times daily.      . Simethicone (MYLANTA GAS PO) Take 30 mLs by mouth daily as needed (for indigestion).       . simvastatin (ZOCOR) 40  MG  tablet TAKE 1 TABLET (40 MG TOTAL) BY MOUTH EVERY EVENING.  90 tablet  1  . tiotropium (SPIRIVA HANDIHALER) 18 MCG inhalation capsule Place 1 capsule (18 mcg total) into inhaler and inhale daily.  1 capsule  1  . Zinc 50 MG TABS Take 50 mg by mouth daily.       . nitroGLYCERIN (NITROSTAT) 0.4 MG SL tablet Place 0.4 mg under the tongue every 5 (five) minutes as needed for chest pain.        No results found for this or any previous visit (from the past 48 hour(s)). No results found.  Review of Systems  Constitutional: Negative.   HENT: Negative.   Eyes: Negative.   Respiratory: Negative.   Cardiovascular: Negative.   Gastrointestinal: Negative.   Genitourinary: Negative.   Musculoskeletal: Negative.   Skin: Negative.   Neurological: Negative.   Endo/Heme/Allergies: Negative.   Psychiatric/Behavioral: Negative.     Blood pressure 126/78, pulse 87, temperature 97.7 F (36.5 C), temperature source Oral, height 5\' 10"  (1.778 m), weight 72.122 kg (159 lb), SpO2 96.00%. Physical Exam  Constitutional: He is oriented to person, place, and time. He appears well-developed. No distress.  HENT:  Head: Normocephalic and atraumatic.  Right Ear: External ear normal.  Left Ear: External ear normal.  Nose: Nose normal.  Mouth/Throat: Oropharynx is clear and moist.  Eyes: Conjunctivae and EOM are normal. Pupils are equal, round, and reactive to light.  Neck: Normal range of motion. Neck supple. No tracheal deviation present. No thyromegaly present.  Cardiovascular: Normal rate, regular rhythm, normal heart sounds and intact distal pulses.   Respiratory: Effort normal and breath sounds normal. No respiratory distress. He has no wheezes.  GI: Soft. Bowel sounds are normal. He exhibits no distension. There is no tenderness.  Neurological: He is alert and oriented to person, place, and time. He has normal reflexes. No cranial nerve deficit. Coordination normal.  Thoracic kyphosis with tenderness.  Patient with chronic spasticity and some dystonia.  Skin: Skin is warm and dry. No rash noted. He is not diaphoretic. No erythema.  Psychiatric: He has a normal mood and affect. His behavior is normal. Judgment and thought content normal.     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 Plan reexploration of thoracic lumbar fusion with removal of upper hardware and augmentation of fusion. Risks and benefits been explained. Patient wishes to proceed.  Mallie Mussel A Nida Manfredi 06/21/2013, 10:47 AM

## 2013-06-21 NOTE — Anesthesia Preprocedure Evaluation (Addendum)
Anesthesia Evaluation  Patient identified by MRN, date of birth, ID band Patient awake    Reviewed: Allergy & Precautions, H&P , NPO status , Patient's Chart, lab work & pertinent test results, reviewed documented beta blocker date and time   Airway Mallampati: II TM Distance: >3 FB Neck ROM: full    Dental  (+) Dental Advisory Given, Missing   Pulmonary shortness of breath, asthma , pneumonia -, resolved, COPD COPD inhaler and oxygen dependent, former smoker,  breath sounds clear to auscultation        Cardiovascular hypertension, Pt. on medications and Pt. on home beta blockers + CAD, + Past MI and + Cardiac Stents Rhythm:regular     Neuro/Psych  Headaches, PSYCHIATRIC DISORDERS Anxiety Depression Bipolar Disorder TIA Neuromuscular disease    GI/Hepatic Neg liver ROS, GERD-  Medicated and Controlled,  Endo/Other  negative endocrine ROS  Renal/GU negative Renal ROS  negative genitourinary   Musculoskeletal   Abdominal   Peds  Hematology negative hematology ROS (+) anemia ,   Anesthesia Other Findings See surgeon's H&P   Reproductive/Obstetrics negative OB ROS                          Anesthesia Physical Anesthesia Plan  ASA: III  Anesthesia Plan: General   Post-op Pain Management:    Induction: Intravenous  Airway Management Planned: Oral ETT  Additional Equipment: Arterial line  Intra-op Plan:   Post-operative Plan: Extubation in OR  Informed Consent: I have reviewed the patients History and Physical, chart, labs and discussed the procedure including the risks, benefits and alternatives for the proposed anesthesia with the patient or authorized representative who has indicated his/her understanding and acceptance.   Dental Advisory Given  Plan Discussed with: CRNA and Surgeon  Anesthesia Plan Comments:         Anesthesia Quick Evaluation

## 2013-06-21 NOTE — Op Note (Signed)
Date of procedure: 06/21/2013  Date of dictation: Same  Service: Neurosurgery  Preoperative diagnosis: T8/T9 compression fracture/pseudoarthrosis status post T8-L5 fusion with instrumentation  Postoperative diagnosis: Same  Procedure Name: Reexploration of her thoracic fusion with removal of painful instrumentation T8-T9. Augmentation/revision posterior lateral arthrodesis T8 T9-T10 with [place] bone graft extender and local autograft.  Surgeon:Amelda Hapke A.Dakari Cregger, M.D.  Asst. Surgeon: Joya Salm  Anesthesia: General  Indication: 68 year old male status post T8-L5 fusion for thoracic lumbar kyphoscoliosis. Patient had been doing well to approximately 3 months ago when he developed midthoracic pain with some radiation into his right chest wall. Workup demonstrates evidence of hardware loosening at T8-T9 with some degree of compression fracture of both T8 and T9. Perfusion distally this appears solid. Bones at T8 and T9 appear to be healing solidly but the loosened instrumentation continue to cause discomfort. Plan reexploration of fusion with removal of painful instrumentation and augmentation of posterior lateral arthrodesis.  Operative note: After induction anesthesia, patient positioned prone onto Wilson frame and appropriately padded. Thoracic region prepped and draped. Incision made overlying T8 and T9. Dissection performed bilaterally. Pedicle screws at T8 and T9 were exposed. Rods were cut proximal to the T10 screws. Screws were disassembled and all instrumentation was removed. There is no evidence of gross instability. Lamina and facet joints of T8-T9 and superior aspect of T10 were decorticated using high-speed drill. Morselized autograft was harvested and re\re use and later fusion. The cost bone graft substitute was also placed posterolaterally for augmentation of the fusion. Wound is irrigated one final time. His closed in a typical fashion. Steri-Strips and sterile dressing were applied. No  apparent complications. Patient returns to the recovery room postop.

## 2013-06-21 NOTE — Anesthesia Procedure Notes (Signed)
Procedure Name: Intubation Date/Time: 06/21/2013 11:18 AM Performed by: Octavio Graves Pre-anesthesia Checklist: Patient identified, Timeout performed, Suction available, Emergency Drugs available and Patient being monitored Patient Re-evaluated:Patient Re-evaluated prior to inductionOxygen Delivery Method: Circle system utilized Preoxygenation: Pre-oxygenation with 100% oxygen Intubation Type: IV induction Ventilation: Mask ventilation without difficulty Laryngoscope Size: Miller and 2 Grade View: Grade I Tube type: Oral Number of attempts: 1 Airway Equipment and Method: Stylet Placement Confirmation: ETT inserted through vocal cords under direct vision,  breath sounds checked- equal and bilateral and positive ETCO2 Secured at: 24 cm Tube secured with: Tape Dental Injury: Teeth and Oropharynx as per pre-operative assessment  Comments: IV induction Fredrick- intubation AM CRNA- atraumatic - teeth on bottom as preop- + BS by [employer]

## 2013-06-21 NOTE — Anesthesia Postprocedure Evaluation (Signed)
Anesthesia Post Note  Patient: Malik Zuniga  Procedure(s) Performed: Procedure(s) (LRB): T8-T9 Removal of hardware with Augmentation of fusion (N/A)  Anesthesia type: General  Patient location: PACU  Post pain: Pain level controlled  Post assessment: Patient's Cardiovascular Status Stable  Last Vitals:  Filed Vitals:   06/21/13 1245  BP:   Pulse: 82  Temp:   Resp: 15    Post vital signs: Reviewed and stable  Level of consciousness: alert  Complications: No apparent anesthesia complications

## 2013-06-21 NOTE — Progress Notes (Signed)
1445 pt arrives to the unit via stretcher from PACU; pt A&O x4; able to move himself from the stretcher to bed by scooting on the bed; pt oriented to the unit; IV remains intact and infusing; pt HC dsg dry and intact; pt on 2L oxygen; family and belongings at bedside. Pt pain assessed and will continue to monitor pt quietly. Francis Gaines Abbott Jasinski RN.

## 2013-06-21 NOTE — Telephone Encounter (Signed)
Did not receive cardiac clearance fax rqst.will route to Dr.Smith for approval

## 2013-06-21 NOTE — Brief Op Note (Signed)
06/21/2013  12:11 PM  PATIENT:  Malik Zuniga  68 y.o. male  PRE-OPERATIVE DIAGNOSIS:  Thoracic compression fracture  POST-OPERATIVE DIAGNOSIS:  Thoracic compression fracture  PROCEDURE:  Procedure(s) with comments: T8-T9 Removal of hardware with Augmentation of fusion (N/A) - T8-T9 Removal of hardware with Augmentation of fusion  SURGEON:  Surgeon(s) and Role:    * Charlie Pitter, MD - Primary  PHYSICIAN ASSISTANT:   ASSISTANTS: [place]   ANESTHESIA:   general  EBL:  Total I/O In: 800 [I.V.:800] Out: -   BLOOD ADMINISTERED:none  DRAINS: none   LOCAL MEDICATIONS USED:  MARCAINE     SPECIMEN:  No Specimen  DISPOSITION OF SPECIMEN:  N/A  COUNTS:  YES  TOURNIQUET:  * No tourniquets in log *  DICTATION: .Dragon Dictation  PLAN OF CARE: Admit to inpatient   PATIENT DISPOSITION:  PACU - hemodynamically stable.   Delay start of Pharmacological VTE agent (>24hrs) due to surgical blood loss or risk of bleeding: yes

## 2013-06-21 NOTE — Transfer of Care (Signed)
Immediate Anesthesia Transfer of Care Note  Patient: Malik Zuniga  Procedure(s) Performed: Procedure(s) with comments: T8-T9 Removal of hardware with Augmentation of fusion (N/A) - T8-T9 Removal of hardware with Augmentation of fusion  Patient Location: PACU  Anesthesia Type:General  Level of Consciousness: awake and alert   Airway & Oxygen Therapy: Patient Spontanous Breathing and Patient connected to nasal cannula oxygen  Post-op Assessment: Report given to PACU RN and Post -op Vital signs reviewed and stable  Post vital signs: Reviewed and stable  Complications: No apparent anesthesia complications

## 2013-06-22 MED ORDER — DIAZEPAM 5 MG PO TABS: 5.0000 mg | ORAL_TABLET | Freq: Four times a day (QID) | ORAL | Status: DC | PRN

## 2013-06-22 MED ORDER — OXYCODONE-ACETAMINOPHEN 10-325 MG PO TABS: 1.0000 | ORAL_TABLET | Freq: Four times a day (QID) | ORAL | Status: DC | PRN

## 2013-06-22 NOTE — Progress Notes (Signed)
Pt A&O x4; pt discharge education and instructions completed with pt and spouse at bedside. Both voices understanding and denies any questions. All lines including pt IV removed. Pt HC dsg remains clean, dry and intact. Pt spouse handed the prescription order for valium and percocet. Pt transported off unit via pt's personal wheelchair with spouse and belongings at side. Francis Gaines Destenee Guerry RN.

## 2013-06-22 NOTE — Progress Notes (Signed)
OT Cancellation Note  Patient Details Name: Malik Zuniga MRN: [id number] DOB: 11/03/1945   Cancelled Treatment:    Reason Eval/Treat Not Completed: OT screened, no needs identified, will sign off.  Pt/spouse state no OT needs. Pt has had multiple back surgeries. Will sign off.  Benito Mccreedy OTR/L [phone] 06/22/2013, 10:25 AM

## 2013-06-22 NOTE — Progress Notes (Signed)
Utilization review completed.  

## 2013-06-24 NOTE — Discharge Summary (Signed)
Physician Discharge Summary  Patient ID: Malik Zuniga MRN: [id number] DOB/AGE: 1945/10/13 68 y.o.  Admit date: 06/21/2013 Discharge date: 06/24/2013  Admission Diagnoses:  Discharge Diagnoses:  Principal Problem:   Thoracic compression fracture Active Problems:   Thoracic spine fracture   Discharged Condition: good  Hospital Course: Patient admitted to the hospital where he underwent uncomplicated removal of thoracic instrumentation and augmentation of his thoracic fusion. Postoperatively his pain is improved. His neurological exam is at baseline. He is very for discharge home.  Consults:   Significant Diagnostic Studies:   Treatments:   Discharge Exam: Blood pressure 120/73, pulse 95, temperature 98 F (36.7 C), temperature source Oral, resp. rate 18, height 5\' 10"  (1.778 m), weight 72.122 kg (159 lb), SpO2 93.00%. Awake and alert. Oriented and appropriate. Motor and sensory function intact. Wound clean and dry. Chest abdomen benign.  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 For pain     albuterol 108 (90 BASE) MCG/ACT inhaler  Commonly known as:  PROAIR HFA  Inhale 2 puffs into the lungs every 4 (four) hours as needed for wheezing.     ARIPiprazole 10 MG tablet  Commonly known as:  ABILIFY  Take 5 mg by mouth daily.     aspirin-acetaminophen-caffeine 409-811-91 MG per tablet  Commonly known as:  EXCEDRIN MIGRAINE  Take 2 tablets by mouth  every 6 (six) hours as needed. For headaches     budesonide-formoterol 160-4.5 MCG/ACT inhaler  Commonly known as:  SYMBICORT  Inhale 2 puffs into the lungs 2 (two) times daily.     cholecalciferol 1000 UNITS tablet  Commonly known as:  VITAMIN D  Take 1,000 Units by mouth daily.     diazepam 5 MG tablet  Commonly known as:  VALIUM  Take 1 tablet (5 mg total) by mouth every 6 (six) hours as needed for muscle spasms. scheduled     divalproex 500 MG 24 hr tablet  Commonly known as:  DEPAKOTE ER  Take 1,000 mg by mouth at bedtime.     docusate sodium 100 MG capsule  Commonly known as:  COLACE  Take 200 mg by mouth at bedtime. Take 1 tablet in the morning and 2 tablets at  bedtime     DULoxetine 60 MG capsule  Commonly known as:  CYMBALTA  Take 60 mg by mouth daily.     Fish Oil 1000 MG Caps  Take 1 capsule by mouth daily.     fluticasone 50 MCG/ACT nasal spray  Commonly known as:  FLONASE  Place 2 sprays into the nose daily.     losartan-hydrochlorothiazide 50-12.5 MG per tablet  Commonly known as:  HYZAAR  Take 1 tablet by mouth daily.     metoprolol succinate 50 MG 24 hr tablet  Commonly known as:  TOPROL-XL  Take 1 tablet (50 mg total) by  mouth daily. Take with or immediately following a meal.     montelukast 10 MG tablet  Commonly known as:  SINGULAIR  TAKE 1 TABLET (10 MG TOTAL) BY MOUTH AT BEDTIME.     MYLANTA GAS PO  Take 30 mLs by mouth daily as needed (for indigestion).     nitroGLYCERIN 0.4 MG SL tablet  Commonly known as:  NITROSTAT  Place 0.4 mg under the tongue every 5 (five) minutes as needed for chest pain.     omeprazole 40 MG capsule  Commonly known as:  PRILOSEC  Take 40 mg by mouth 2 (two) times daily.     OVER THE COUNTER MEDICATION  Take 1 tablet by mouth every 4 (four) hours as needed (for nausea. [employer] brand nausea relief).     OVER THE COUNTER MEDICATION  Take 1 tablet by mouth at bedtime as needed (for sleep. [employer] brand of sleep  aid).     oxyCODONE-acetaminophen 10-325 MG per tablet  Commonly known as:  PERCOCET  Take 1-2 tablets by mouth every 6 (six) hours as needed for pain.     polyethylene glycol packet  Commonly known as:  MIRALAX / GLYCOLAX  Take 17 g by mouth 2 (two) times daily. For constipation     primidone 250 MG tablet  Commonly known as:  MYSOLINE  Take 250 mg by mouth 2 (two) times daily.     simvastatin 40 MG tablet  Commonly known as:  ZOCOR  TAKE 1 TABLET (40 MG TOTAL) BY MOUTH EVERY EVENING.     tiotropium 18 MCG inhalation capsule  Commonly known as:  SPIRIVA HANDIHALER  Place 1 capsule (18 mcg total) into inhaler and inhale daily.     vitamin C 500 MG tablet  Commonly known as:  ASCORBIC ACID  Take 1,000 mg by mouth daily.     Zinc 50 MG Tabs  Take 50 mg by mouth daily.         Signed: Cooper Render Taye Cato 06/24/2013, 3:39 PM

## 2013-06-28 NOTE — Telephone Encounter (Signed)
Patient will be at moderate risk for CV complications due to medical co morbid conditions.

## 2013-07-01 ENCOUNTER — Other Ambulatory Visit: Payer: Self-pay | Admitting: Family Medicine

## 2013-07-01 NOTE — Telephone Encounter (Signed)
cardiac clearance  faxed to [phone] Dr.Pool attn:Vanessa

## 2013-07-06 ENCOUNTER — Encounter: Payer: Medicare Other | Admitting: Family Medicine

## 2013-07-27 ENCOUNTER — Ambulatory Visit (INDEPENDENT_AMBULATORY_CARE_PROVIDER_SITE_OTHER): Payer: Medicare Other | Admitting: Interventional Cardiology

## 2013-07-27 ENCOUNTER — Encounter: Payer: Self-pay | Admitting: Interventional Cardiology

## 2013-07-27 VITALS — BP 113/80 | HR 100 | Ht 70.0 in | Wt 159.0 lb

## 2013-07-27 DIAGNOSIS — J438 Other emphysema: Secondary | ICD-10-CM | POA: Diagnosis not present

## 2013-07-27 DIAGNOSIS — J439 Emphysema, unspecified: Secondary | ICD-10-CM

## 2013-07-27 DIAGNOSIS — I10 Essential (primary) hypertension: Secondary | ICD-10-CM | POA: Diagnosis not present

## 2013-07-27 DIAGNOSIS — E785 Hyperlipidemia, unspecified: Secondary | ICD-10-CM | POA: Diagnosis not present

## 2013-07-27 DIAGNOSIS — I2510 Atherosclerotic heart disease of native coronary artery without angina pectoris: Secondary | ICD-10-CM | POA: Diagnosis not present

## 2013-07-27 DIAGNOSIS — I5042 Chronic combined systolic (congestive) and diastolic (congestive) heart failure: Secondary | ICD-10-CM

## 2013-07-27 MED ORDER — NITROGLYCERIN 0.4 MG SL SUBL: 0.4000 mg | SUBLINGUAL_TABLET | SUBLINGUAL | Status: DC | PRN

## 2013-07-27 NOTE — Patient Instructions (Signed)
Your physician recommends that you continue on your current medications as directed. Please refer to the Current Medication list given to you today.  An Rx for Nitroglycerin has been sent to your pharmacy  Your physician wants you to follow-up in: 1 year You will receive a reminder letter in the mail two months in advance. If you don't receive a letter, please call our office to schedule the follow-up appointment.

## 2013-07-27 NOTE — Progress Notes (Signed)
Patient ID: Malik Zuniga, male   DOB: Aug 22, 1945, 68 y.o.   MRN: [id number]    [address], [place],   [address] Phone: [phone] Fax:  [phone]  Date:  07/27/2013   ID:  Malik Zuniga, DOB [birth year], MRN [id number]  PCP:  [name], MD   ASSESSMENT:  1. Coronary atherosclerosis, stable without angina 2. COPD, stable and finally the patient is discontinue cigarette smoking now for approximately 9 months 3. Hyperlipidemia 4. Hypertension  PLAN:  1. update prescription for nitroglycerin 2. Clinical followup in one year 3. Call if angina or other CV complaints   SUBJECTIVE: Malik Zuniga is a 68 y.o. male who is doing well. He has had rare episodes of angina. He underwent complicated thoracic and lumbar spine surgery by Dr. Mallie Mussel poor. He is doing better. The pain has significantly decreased although there was a local incisional infection that is healing. He denies orthopnea PND. He does smell of cigarette smoke.   Wt Readings from Last 3 Encounters:  07/27/13 159 lb (72.122 kg)  06/21/13 159 lb (72.122 kg)  06/21/13 159 lb (72.122 kg)     Past Medical History  Diagnosis Date  . Hyperlipidemia     takes Simvastatin daily  . Tremor   . History of prostate cancer   . On home O2   . Neuromuscular disorder 1998    right carpal tunnel release  . Anemia associated with acute blood loss   . Cancer 2004    prostate  . GERD (gastroesophageal reflux disease)     takes Omeprazole daily  . Hypertension     takes Metoprolol daily as well as Hyzaar  . Constipation     takes Colace daily as well as Miralax  . Anxiety     takes Valium daily  . Depression     takes Cymbalta daily  . Emphysema of lung     Albuterol as needed;Symbicort daily and Singulair at bedtime  . Emphysema   . Myocardial infarction 04/1998  . Coronary artery disease   . Asthma   . Shortness of breath     with exertion  . History of bronchitis   . Aspiration  pneumonia 2010  . Headache, chronic daily   . History of migraine     last migraine a couple of days ago;takes Excedrin Migraine  . Chronic back pain     compression fracture  . History of colon polyps   . Mood change     after Brain surgery mood changed and was placed on Depakote  . Insomnia     takes Benadryl nightly    Current Outpatient Prescriptions  Medication Sig Dispense Refill  . acetaminophen (TYLENOL) 325 MG tablet Take 650 mg by mouth every 6 (six) hours as needed. For pain      . albuterol (PROAIR HFA) 108 (90 BASE) MCG/ACT inhaler Inhale 2 puffs into the lungs every 4 (four) hours as needed for wheezing.  1 Inhaler  6  . ARIPiprazole (ABILIFY) 10 MG tablet Take 5 mg by mouth daily.      . Ascorbic Acid (VITAMIN C) 500 MG tablet Take 1,000 mg by mouth daily.       Marland Kitchen aspirin-acetaminophen-caffeine (EXCEDRIN MIGRAINE) 250-250-65 MG per tablet Take 2 tablets by mouth every 6 (six) hours as needed. For headaches      . budesonide-formoterol (SYMBICORT) 160-4.5 MCG/ACT inhaler Inhale 2 puffs into the lungs 2 (two) times daily.  1  Inhaler  1  . cholecalciferol (VITAMIN D) 1000 UNITS tablet Take 1,000 Units by mouth daily.      . diazepam (VALIUM) 5 MG tablet Take 1 tablet (5 mg total) by mouth every 6 (six) hours as needed for muscle spasms. scheduled  50 tablet  0  . divalproex (DEPAKOTE) 500 MG 24 hr tablet Take 1,000 mg by mouth at bedtime.       . docusate sodium (COLACE) 100 MG capsule Take 200 mg by mouth at bedtime. Take 1 tablet in the morning and 2 tablets at  bedtime      . doxycycline (VIBRA-TABS) 100 MG tablet 1 tab twice a day      . DULoxetine (CYMBALTA) 60 MG capsule Take 60 mg by mouth daily.      . fluticasone (FLONASE) 50 MCG/ACT nasal spray Place 2 sprays into the nose daily.      Marland Kitchen losartan-hydrochlorothiazide (HYZAAR) 50-12.5 MG per tablet Take 1 tablet by mouth daily.      . metoprolol succinate (TOPROL-XL) 50 MG 24 hr tablet Take 1 tablet (50 mg total) by  mouth daily. Take with or immediately following a meal.  30 tablet  5  . montelukast (SINGULAIR) 10 MG tablet TAKE 1 TABLET (10 MG TOTAL) BY MOUTH AT BEDTIME.  30 tablet  5  . nitroGLYCERIN (NITROSTAT) 0.4 MG SL tablet Place 0.4 mg under the tongue every 5 (five) minutes as needed for chest pain.      . Omega-3 Fatty Acids (FISH OIL) 1000 MG CAPS Take 1 capsule by mouth daily.      Marland Kitchen omeprazole (PRILOSEC) 40 MG capsule Take 40 mg by mouth 2 (two) times daily.       Marland Kitchen OVER THE COUNTER MEDICATION Take 1 tablet by mouth every 4 (four) hours as needed (for nausea. [employer] brand nausea relief).      Marland Kitchen OVER THE COUNTER MEDICATION Take 1 tablet by mouth at bedtime as needed (for sleep. [employer] brand of sleep aid).      Marland Kitchen oxyCODONE-acetaminophen (PERCOCET) 10-325 MG per tablet Take 1-2 tablets by mouth every 6 (six) hours as needed for pain.  80 tablet  0  . polyethylene glycol (MIRALAX / GLYCOLAX) packet Take 17 g by mouth 2 (two) times daily. For constipation      . primidone (MYSOLINE) 250 MG tablet TAKE 1 TABLET BY MOUTH TWICE A DAY  60 tablet  2  . Simethicone (MYLANTA GAS PO) Take 30 mLs by mouth daily as needed (for indigestion).       . simvastatin (ZOCOR) 40 MG tablet TAKE 1 TABLET (40 MG TOTAL) BY MOUTH EVERY EVENING.  90 tablet  1  . tiotropium (SPIRIVA HANDIHALER) 18 MCG inhalation capsule Place 1 capsule (18 mcg total) into inhaler and inhale daily.  1 capsule  1  . Zinc 50 MG TABS Take 50 mg by mouth daily.        No current facility-administered medications for this visit.    Allergies:    Allergies  Allergen Reactions  . Lisinopril Swelling    ANGIOEDEMA  . Ambien [Zolpidem] Other (See Comments)    Unknown reaction  . Lamictal [Lamotrigine] Other (See Comments)    Weakness/difficulty swallowing    Social History:  The patient  reports that he has quit smoking. His smoking use included Cigarettes. He has a 25 pack-year smoking history. He has never used smokeless tobacco. He reports  that he does not drink alcohol or use illicit drugs.  ROS:  Please see the history of present illness.   No edema or palpitations.   All other systems reviewed and negative.   OBJECTIVE: VS:  BP 113/80  Pulse 100  Ht 5\' 10"  (1.778 m)  Wt 159 lb (72.122 kg)  BMI 22.81 kg/m2 Well nourished, well developed, in no acute distress, older than stated age 69: normal Neck: JVD flat. Carotid bruit absent  Cardiac:  normal S1, S2; RRR; no murmur Lungs:  clear to auscultation bilaterally, no wheezing, rhonchi or rales Abd: soft, nontender, no hepatomegaly Ext: Edema absent. Pulses 2+ Skin: warm and dry Neuro:  CNs 2-12 intact, no focal abnormalities noted  EKG:  Not performed       Signed, Illene Labrador III, MD 07/27/2013 11:32 AM

## 2013-08-05 ENCOUNTER — Ambulatory Visit (INDEPENDENT_AMBULATORY_CARE_PROVIDER_SITE_OTHER): Payer: Medicare Other | Admitting: Family Medicine

## 2013-08-05 ENCOUNTER — Telehealth: Payer: Self-pay | Admitting: *Deleted

## 2013-08-05 ENCOUNTER — Encounter: Payer: Self-pay | Admitting: Family Medicine

## 2013-08-05 VITALS — BP 132/72 | HR 82 | Temp 98.0°F | Resp 17

## 2013-08-05 DIAGNOSIS — M419 Scoliosis, unspecified: Secondary | ICD-10-CM

## 2013-08-05 DIAGNOSIS — R259 Unspecified abnormal involuntary movements: Secondary | ICD-10-CM

## 2013-08-05 DIAGNOSIS — F1910 Other psychoactive substance abuse, uncomplicated: Secondary | ICD-10-CM

## 2013-08-05 DIAGNOSIS — I2510 Atherosclerotic heart disease of native coronary artery without angina pectoris: Secondary | ICD-10-CM

## 2013-08-05 DIAGNOSIS — J439 Emphysema, unspecified: Secondary | ICD-10-CM

## 2013-08-05 DIAGNOSIS — F558 Abuse of other non-psychoactive substances: Secondary | ICD-10-CM | POA: Insufficient documentation

## 2013-08-05 DIAGNOSIS — M4120 Other idiopathic scoliosis, site unspecified: Secondary | ICD-10-CM

## 2013-08-05 DIAGNOSIS — J438 Other emphysema: Secondary | ICD-10-CM

## 2013-08-05 NOTE — Patient Instructions (Signed)
Follow up as scheduled DO NOT TAKE MORE THAN 2,000mg  of ACETAMINOPHEN DAILY Heat your back Try and sit more upright- this will improve the muscle spasm You can take the Valium 3x/day but I would not increase the dose due to possible side effects I'll send the letter to your insurance company for the Symbicort Look into patient assistance programs for medication When you decide you want to see DR TAT, let me know Call with any questions or concerns Hang in there!!!

## 2013-08-05 NOTE — Telephone Encounter (Signed)
Spoke with patient who stated he is having side and back pain that started yesterday. No difficulty or pain with urination. No nausea or vomiting.Patient had back surgery about a month ago but states this pain is much different. He states that he feels like he is healing well form the surgery but wants to see a provider to be on the safe side.

## 2013-08-05 NOTE — Progress Notes (Signed)
   Subjective:    Patient ID: Malik Zuniga, male    DOB: 1945-11-08, 68 y.o.   MRN: [id number]  HPI Back pain- less than 2 months ago had 7th back surgery where they shortened the rods in his back and replaced the screws.  Current pain is unrelated to surgery.  Current pain is R lateral/flank pain.  'i'm a little bit worried about my liver and a little bit worried about my pancreas'.  When asked why, he reports he's been taking 'way, way too much acetaminophen- over 3,000mg  a day'.  Pt reports he has 'cut back'.  Now taking ~1500mg  daily.  Taking Valium twice daily- would like to increase to 3x/day and 'a higher dose'  No dysuria, + hesitancy- currently on 2nd round of doxycycline for wound infection s/p surgery.  COPD- pt needs letter to [facility] stating he requires Symbicort.  Quit smoking 7 months ago.  Tremor- pt reports this is worse than previous.  Was 'embarrassed' last night while out to eat.  Asking about increasing Primidone.  Pt reports he is not currently following w/ Neuro- states [facility] had 'nothing more to offer'.   Review of Systems For ROS see HPI     Objective:   Physical Exam  Vitals reviewed. Constitutional: He is oriented to person, place, and time. He appears well-developed and well-nourished. No distress.  Pt sitting in odd, contorted positions in wheelchair  Cardiovascular: Normal rate and regular rhythm.   Pulmonary/Chest: Effort normal and breath sounds normal. No respiratory distress. He has no wheezes. He has no rales.  Abdominal:  No CVA tenderness  Musculoskeletal:  + TTP over R lumbar paraspinal muscles  Neurological: He is alert and oriented to person, place, and time.  + tremors of hands bilaterally          Assessment & Plan:

## 2013-08-05 NOTE — Progress Notes (Signed)
Pre visit review using our clinic review tool, if applicable. No additional management support is needed unless otherwise documented below in the visit note. 

## 2013-08-07 NOTE — Assessment & Plan Note (Signed)
Chronic problem for pt.  Will not increase Primidone due to risk at higher dose.  Offered referral to Dr Tat- pt declined at this time and prefers to hold until a later date.  Will follow.

## 2013-08-07 NOTE — Assessment & Plan Note (Signed)
Pt has quit smoking- applauded this effort.  Pt needs letter in order to continue his Symbicort which has made a huge difference in relieving his COPD symptoms

## 2013-08-07 NOTE — Assessment & Plan Note (Signed)
Chronic problem.  Pt's contorted position is likely responsible for his muscular pain.  Due to his urinary hesitancy on pain meds and Valium, will not increase dosage of Valium but can increase to TID.  Pt to use heating pad for pain relief.  Pt to f/u w/ surgeon if pain continues

## 2013-08-07 NOTE — Assessment & Plan Note (Signed)
New.  Discussed w/ pt the severity of taking too much tylenol and the risk of liver failure.  Check labs.  Pt to cut back his usage.

## 2013-08-09 ENCOUNTER — Telehealth: Payer: Self-pay | Admitting: General Practice

## 2013-08-09 ENCOUNTER — Encounter: Payer: Self-pay | Admitting: Family Medicine

## 2013-08-09 MED ORDER — BUDESONIDE-FORMOTEROL FUMARATE 160-4.5 MCG/ACT IN AERO: 2.0000 | INHALATION_SPRAY | Freq: Two times a day (BID) | RESPIRATORY_TRACT | Status: DC

## 2013-08-09 NOTE — Telephone Encounter (Signed)
FYI. Received a fax from [employer] advising that Pt's symbicort has been approved from 02-10-13 until 02-09-14. Pt notified and med reordered to local pharmacy as requested. Approval sent to scanning. Letter from [name] is on hand for future PA's.

## 2013-08-11 ENCOUNTER — Telehealth: Payer: Self-pay | Admitting: *Deleted

## 2013-08-11 NOTE — Telephone Encounter (Signed)
Message copied by Chilton Greathouse on Thu Aug 11, 2013  9:59 AM ------      Message from: [name]      Created: Wed Aug 10, 2013  8:05 AM       Please contact pt to return for labs- he left w/o having these done            ----- Message -----         From: SYSTEM         Sent: 08/10/2013  12:01 AM           To: [name], MD                   ------

## 2013-08-11 NOTE — Telephone Encounter (Signed)
Called patient and left a message for him to call the office to schedule labs. Patient left the office after his recent appt without having labwork done. If patient calls back please schedule a lab appt, orders are in.

## 2013-08-13 ENCOUNTER — Other Ambulatory Visit: Payer: Self-pay | Admitting: Family Medicine

## 2013-08-15 ENCOUNTER — Telehealth: Payer: Self-pay | Admitting: Family Medicine

## 2013-08-15 DIAGNOSIS — R259 Unspecified abnormal involuntary movements: Secondary | ICD-10-CM

## 2013-08-15 NOTE — Telephone Encounter (Signed)
Caller name: [name] Relation to pt: wife Call back [phone]   Reason for call:  Pt would like to go ahead and get a referral to a neurologist.  Dr. Carles Collet pleae.

## 2013-08-15 NOTE — Telephone Encounter (Signed)
Referral ordered.  Pt is aware. No further questions or concerns at this time.

## 2013-08-15 NOTE — Telephone Encounter (Signed)
Rx sent to the pharmacy by e-script.//AB/CMA 

## 2013-08-17 ENCOUNTER — Telehealth: Payer: Self-pay

## 2013-08-17 NOTE — Telephone Encounter (Addendum)
Received paperwork for prescription savings program from patient's wife, Aziel Morgan on 08/15/13.  Forms completed on 08/17/13.  Pt made aware that paperwork is complete.  Stated that he would come by and pick up forms tomorrow.  Forms placed up front. Copy of forms placed in batch basket for scanning.

## 2013-08-22 ENCOUNTER — Telehealth: Payer: Self-pay

## 2013-08-22 NOTE — Telephone Encounter (Signed)
Medication List and allergies:  Updated and Reviewed  90 day supply/mail order: n/a Local prescriptions:  [id number] - [place], [place] - [address]  Immunization due:  PNA  A/P: Personal, family and PSH: Reviewed and updated Flu- 12/16/12 Tdap- 05/19/07 PNA- DUE Shingles- 07/11/08 CCS- DUE  PSA- 04/22/12- 0.00   To discuss with provider: Nothing at this time.

## 2013-08-23 ENCOUNTER — Ambulatory Visit (INDEPENDENT_AMBULATORY_CARE_PROVIDER_SITE_OTHER): Payer: Medicare Other | Admitting: Family Medicine

## 2013-08-23 ENCOUNTER — Other Ambulatory Visit: Payer: Self-pay | Admitting: General Practice

## 2013-08-23 ENCOUNTER — Encounter: Payer: Self-pay | Admitting: Family Medicine

## 2013-08-23 VITALS — BP 120/80 | HR 70 | Temp 97.9°F | Resp 17

## 2013-08-23 DIAGNOSIS — J438 Other emphysema: Secondary | ICD-10-CM | POA: Diagnosis not present

## 2013-08-23 DIAGNOSIS — I2510 Atherosclerotic heart disease of native coronary artery without angina pectoris: Secondary | ICD-10-CM

## 2013-08-23 DIAGNOSIS — I10 Essential (primary) hypertension: Secondary | ICD-10-CM | POA: Diagnosis not present

## 2013-08-23 DIAGNOSIS — F319 Bipolar disorder, unspecified: Secondary | ICD-10-CM

## 2013-08-23 DIAGNOSIS — E785 Hyperlipidemia, unspecified: Secondary | ICD-10-CM | POA: Diagnosis not present

## 2013-08-23 DIAGNOSIS — Z8546 Personal history of malignant neoplasm of prostate: Secondary | ICD-10-CM

## 2013-08-23 DIAGNOSIS — Z23 Encounter for immunization: Secondary | ICD-10-CM | POA: Diagnosis not present

## 2013-08-23 DIAGNOSIS — Z0000 Encounter for general adult medical examination without abnormal findings: Secondary | ICD-10-CM

## 2013-08-23 DIAGNOSIS — J431 Panlobular emphysema: Secondary | ICD-10-CM

## 2013-08-23 LAB — CBC WITH DIFFERENTIAL/PLATELET
BASOS PCT: 0.4 % (ref 0.0–3.0)
Basophils Absolute: 0 10*3/uL (ref 0.0–0.1)
EOS PCT: 1.6 % (ref 0.0–5.0)
Eosinophils Absolute: 0.1 10*3/uL (ref 0.0–0.7)
HCT: 34 % — ABNORMAL LOW (ref 39.0–52.0)
HEMOGLOBIN: 11 g/dL — AB (ref 13.0–17.0)
Lymphocytes Relative: 30.6 % (ref 12.0–46.0)
Lymphs Abs: 1.7 10*3/uL (ref 0.7–4.0)
MCHC: 32.3 g/dL (ref 30.0–36.0)
MCV: 85.8 fl (ref 78.0–100.0)
MONO ABS: 0.4 10*3/uL (ref 0.1–1.0)
Monocytes Relative: 7.5 % (ref 3.0–12.0)
Neutro Abs: 3.4 10*3/uL (ref 1.4–7.7)
Neutrophils Relative %: 59.9 % (ref 43.0–77.0)
Platelets: 331 10*3/uL (ref 150.0–400.0)
RBC: 3.97 Mil/uL — AB (ref 4.22–5.81)
RDW: 16.1 % — ABNORMAL HIGH (ref 11.5–15.5)
WBC: 5.7 10*3/uL (ref 4.0–10.5)

## 2013-08-23 LAB — BASIC METABOLIC PANEL
BUN: 15 mg/dL (ref 6–23)
CALCIUM: 9.1 mg/dL (ref 8.4–10.5)
CHLORIDE: 97 meq/L (ref 96–112)
CO2: 20 mEq/L (ref 19–32)
CREATININE: 0.7 mg/dL (ref 0.4–1.5)
GFR: 113.5 mL/min (ref >60.00)
Glucose, Bld: 100 mg/dL — ABNORMAL HIGH (ref 70–99)
Potassium: 4.2 mEq/L (ref 3.5–5.1)
Sodium: 132 mEq/L — ABNORMAL LOW (ref 135–145)

## 2013-08-23 LAB — HEPATIC FUNCTION PANEL
ALT: 30 U/L (ref 0–53)
AST: 27 U/L (ref 0–37)
Albumin: 3.5 g/dL (ref 3.5–5.2)
Alkaline Phosphatase: 117 U/L (ref 39–117)
BILIRUBIN DIRECT: 0.1 mg/dL (ref 0.0–0.3)
BILIRUBIN TOTAL: 0.4 mg/dL (ref 0.2–1.2)
Total Protein: 6.6 g/dL (ref 6.0–8.3)

## 2013-08-23 LAB — LIPID PANEL
CHOL/HDL RATIO: 2
Cholesterol: 220 mg/dL — ABNORMAL HIGH (ref 0–200)
HDL: 103.5 mg/dL (ref >39.00)
LDL Cholesterol: 100 mg/dL — ABNORMAL HIGH (ref 0–99)
NONHDL: 116.5
Triglycerides: 81 mg/dL (ref 0.0–149.0)
VLDL: 16.2 mg/dL (ref 0.0–40.0)

## 2013-08-23 LAB — TSH: TSH: 1.34 u[IU]/mL (ref 0.35–4.50)

## 2013-08-23 LAB — PSA: PSA: 0.01 ng/mL — ABNORMAL LOW (ref 0.10–4.00)

## 2013-08-23 MED ORDER — TIOTROPIUM BROMIDE MONOHYDRATE 18 MCG IN CAPS: 18.0000 ug | ORAL_CAPSULE | Freq: Every day | RESPIRATORY_TRACT | Status: DC

## 2013-08-23 NOTE — Patient Instructions (Signed)
Follow up in 6 months to recheck BP, cholesterol- sooner if needed We'll notify you of your lab results and make any changes if needed Call with any questions or concerns You look good!  Keep it up!!

## 2013-08-23 NOTE — Progress Notes (Signed)
   Subjective:    Patient ID: Malik Zuniga, male    DOB: 1945/10/25, 68 y.o.   MRN: [id number]  HPI Here today for CPE.  Risk Factors: HTN- chronic problem, on Losartan-HCTZ, Metoprolol XL.  Well controlled.  no CP, SOB above baseline, HAs, visual changes, edema.  Hyperlipidemia- chronic problem, on Simvastatin CAD- chronic problem, following w/ Dr Tamala Julian.  Goal is risk reduction w/ BP and lipid control COPD- chronic problem, following w/ Dr [name].  On Spiriva, singulair, Symbicort, Albuterol prn.  Continues to have some intermittent SOB but overall, well controlled.  Has stopped smoking!!  Physical Activity: very limited activity due to multiple back surgeries Fall Risk: elevated due to weakness and instability Depression: chronic problem, carries dx of Bipolar.  Seeing [name]. Hearing: normal to conversational tones, decreased to whispered voice ADL's: independent w/ some equipment modifications- shower bench.  Needs intermittent assistance w/ dressing. Cognitive: normal linear thought process, memory and attention intact Home Safety: safe at home, lives w/ wife Height, Weight, BMI, Visual Acuity: see vitals, vision corrected to 20/20 w/ glasses Counseling: due for colonoscopy- seeing Dr Watt Climes next week.  Seeing Dr Eulogio Ditch Labs Ordered: See A&P Care Plan: See A&P    Review of Systems Patient reports no vision/hearing changes, anorexia, fever ,adenopathy, persistant/recurrent hoarseness, chest pain, palpitations, edema, persistant/recurrent cough, hemoptysis, dyspnea (rest,exertional, paroxysmal nocturnal), gastrointestinal  bleeding (melena, rectal bleeding), abdominal pain, excessive heart burn, GU symptoms (dysuria, hematuria, voiding/incontinence issues) syncope, focal weakness, memory loss, numbness & tingling, skin/hair/nail changes, abnormal bruising/bleeding, musculoskeletal symptoms/signs.  Known dysphagia- Dr Watt Climes aware, thought to be medication related    Objective:    Physical Exam General Appearance:    Alert, cooperative, no distress, appears older than stated age  Head:    Normocephalic, without obvious abnormality, atraumatic  Eyes:    PERRL, conjunctiva/corneas clear, EOM's intact, fundi    benign, both eyes       Ears:    Normal TM's and external ear canals, both ears  Nose:   Nares normal, septum midline, mucosa normal, no drainage   or sinus tenderness  Throat:   Lips, mucosa, and tongue normal; teeth and gums normal  Neck:   Supple, symmetrical, trachea midline, no adenopathy;       thyroid:  No enlargement/tenderness/nodules  Back:     Pt w/ obvious curvature of spine w/ hardware present  Lungs:     Clear to auscultation bilaterally, respirations unlabored  Chest wall:    No tenderness or deformity  Heart:    Regular rate and rhythm, S1 and S2 normal, no murmur, rub   or gallop  Abdomen:     Soft, non-tender, bowel sounds active all four quadrants,    no masses, no organomegaly  Genitalia:    Deferred to urology  Rectal:    Extremities:   Extremities normal, atraumatic, no cyanosis or edema  Pulses:   2+ and symmetric all extremities  Skin:   Skin color, texture, turgor normal, no rashes or lesions  Lymph nodes:   Cervical, supraclavicular, and axillary nodes normal  Neurologic:   CNII-XII intact.           Assessment & Plan:

## 2013-08-23 NOTE — Progress Notes (Signed)
Pre visit review using our clinic review tool, if applicable. No additional management support is needed unless otherwise documented below in the visit note. 

## 2013-08-24 ENCOUNTER — Encounter: Payer: Self-pay | Admitting: General Practice

## 2013-08-24 ENCOUNTER — Telehealth: Payer: Self-pay | Admitting: Family Medicine

## 2013-08-24 NOTE — Telephone Encounter (Signed)
Relevant patient education assigned to patient using Emmi. ° °

## 2013-08-25 ENCOUNTER — Other Ambulatory Visit: Payer: Self-pay

## 2013-08-25 MED ORDER — LOSARTAN POTASSIUM-HCTZ 50-12.5 MG PO TABS: 1.0000 | ORAL_TABLET | Freq: Every day | ORAL | Status: DC

## 2013-08-26 IMAGING — RF DG C-ARM 61-120 MIN
1 series · 7 of 7 positions shown · non-contrast
Comparison: None.

CLINICAL DATA: Back pain.  Thoracolumbar stenosis

DG C-ARM 61-120 MIN,THORACOLUMBAR SPINE - 2 VIEW

[Series 1: run · 7 of 7 slices shown]
[im 1/7]
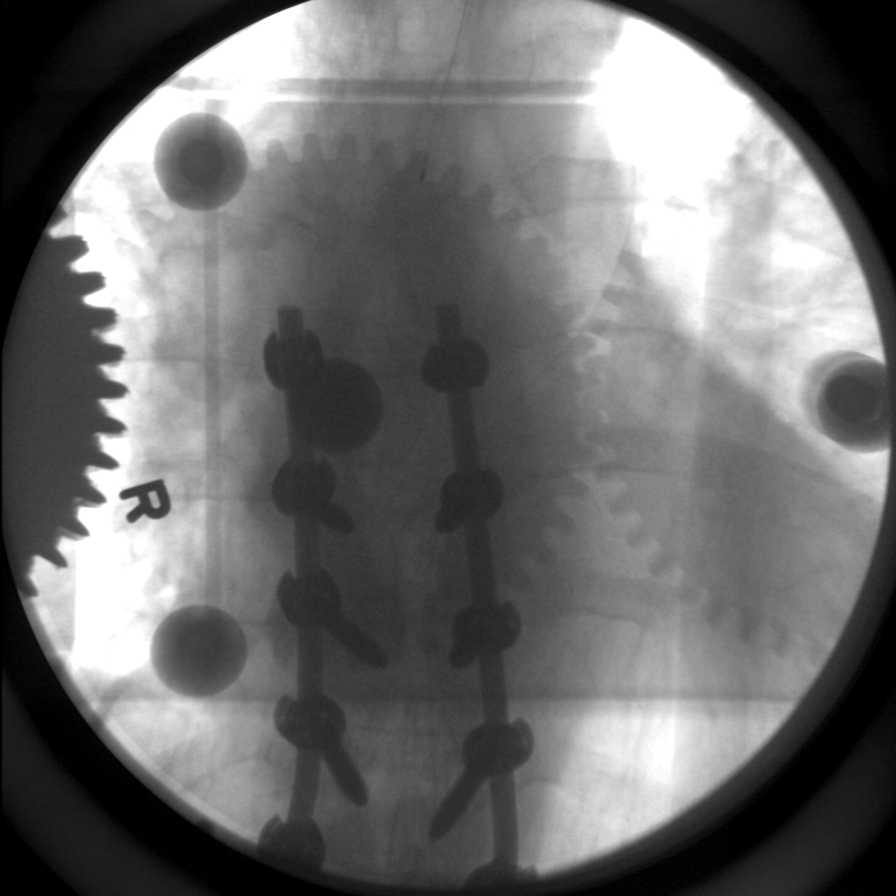
[im 2/7]
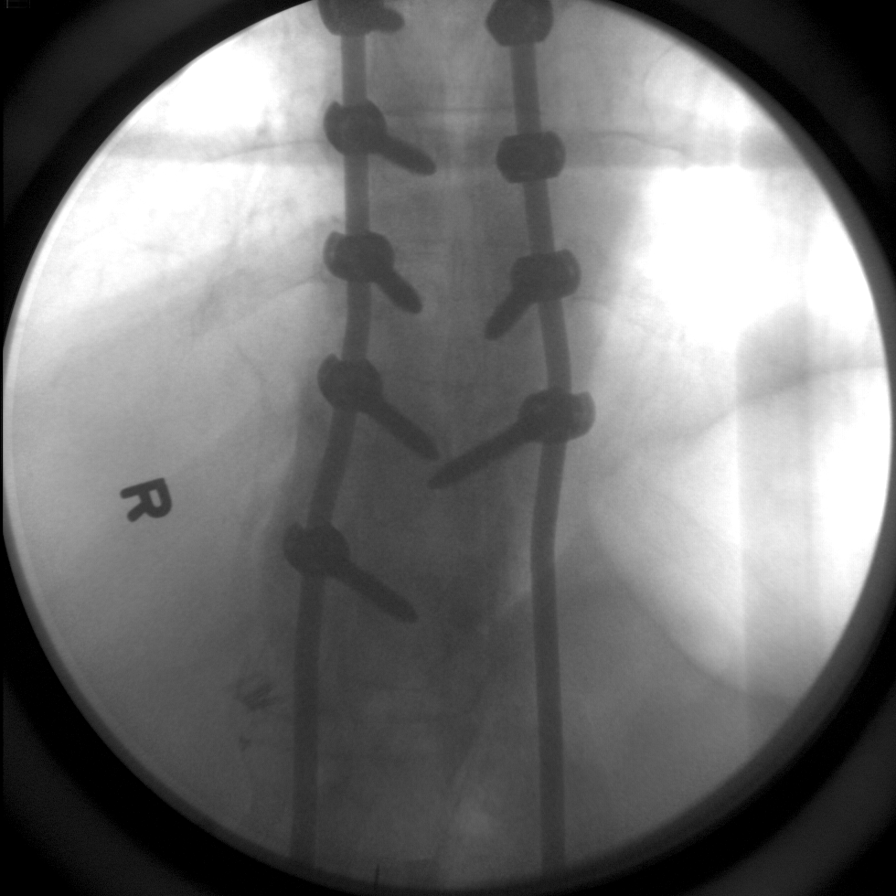
[im 3/7]
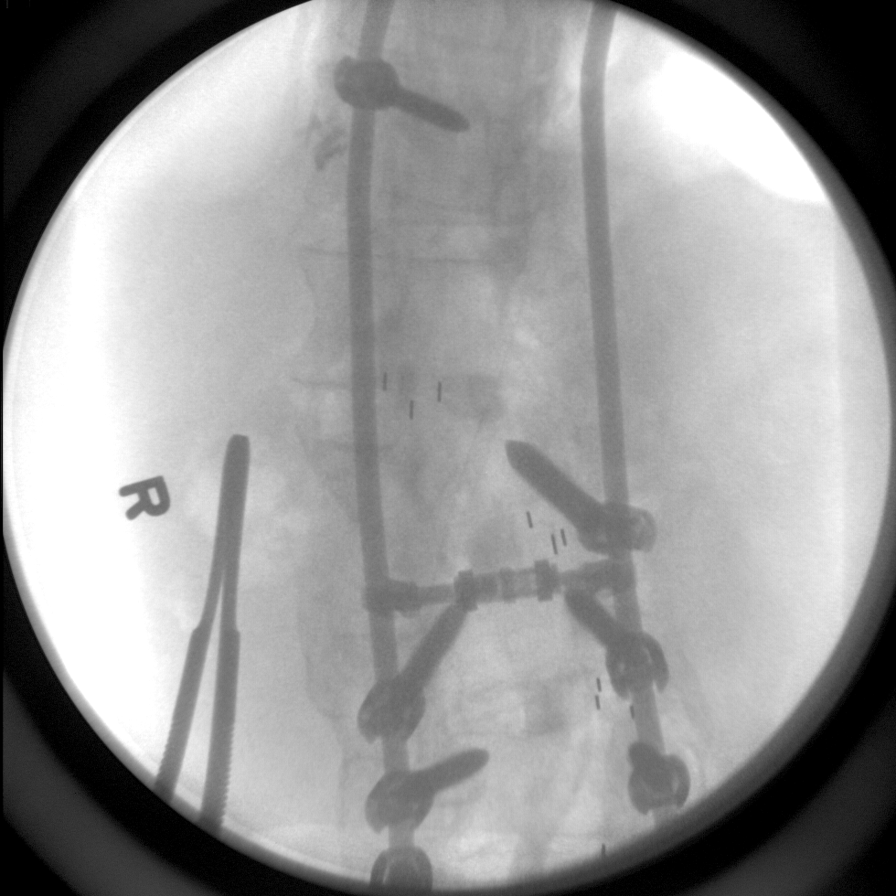
[im 4/7]
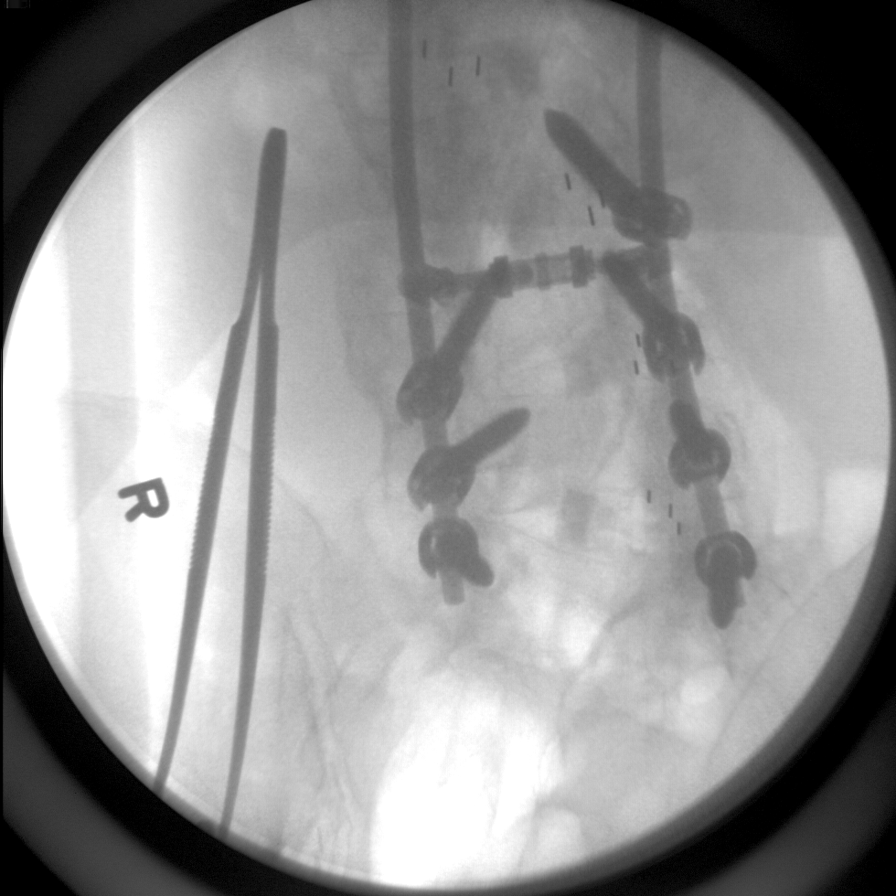
[im 5/7]
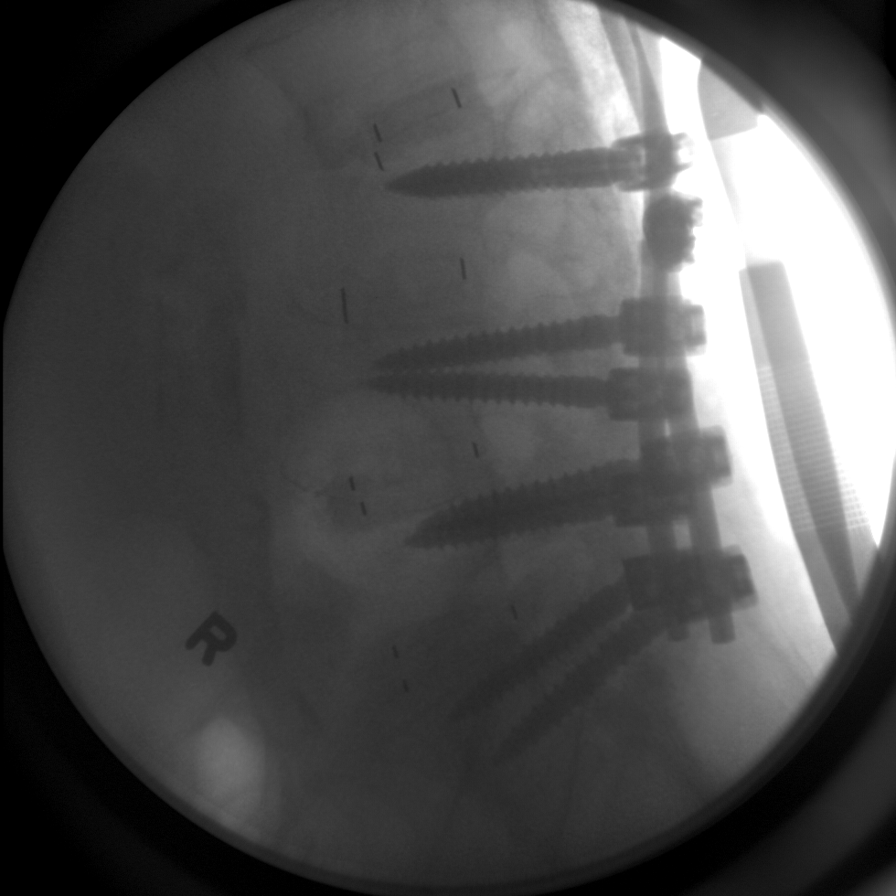
[im 6/7]
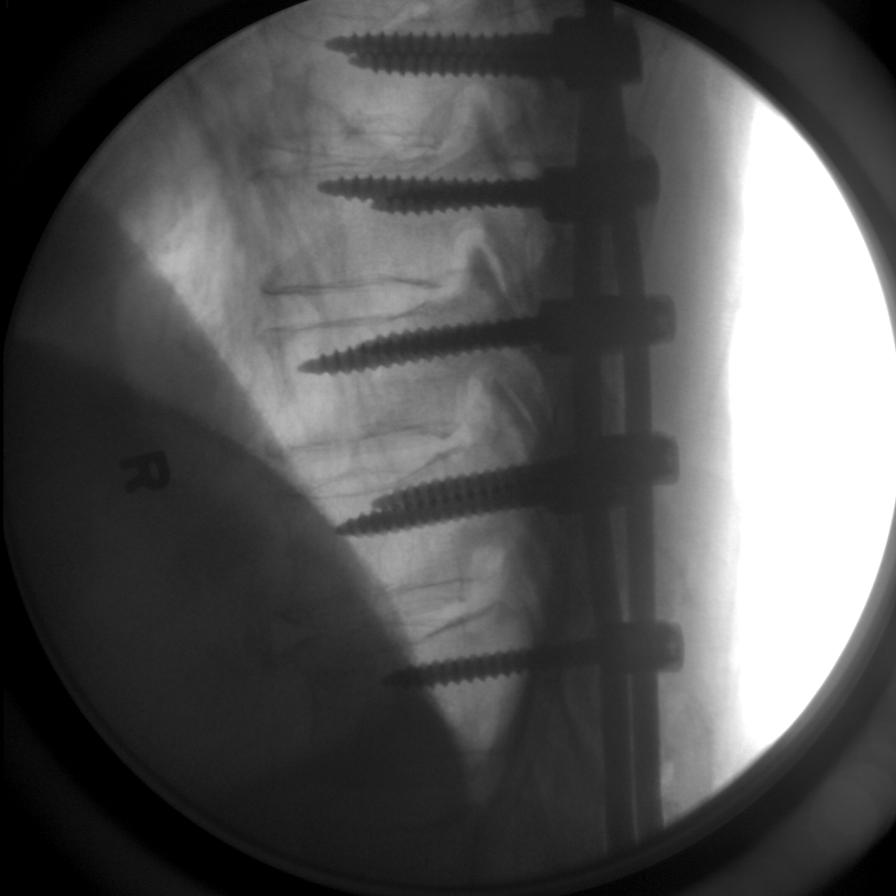
[im 7/7]
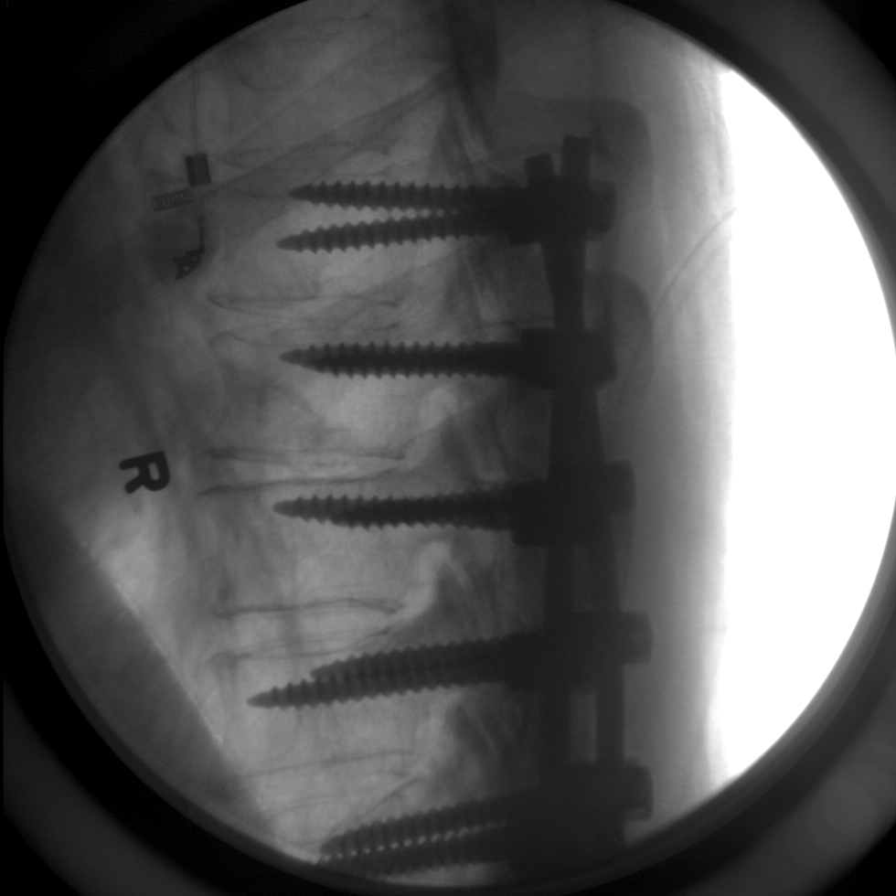

[7 of 7 positions shown; findings below may reference images not displayed]

FINDINGS: C-arm films of the thoracic and lumbar spine document
placement of pedicle screws from T7-S1.  Pedicle screw and rod
construct appears grossly intact.  Plain films postoperatively
recommended for further evaluation as the entire construct is not
visible on the limited field of view C-arm images.
IMPRESSION: As above.

## 2013-08-30 DIAGNOSIS — F331 Major depressive disorder, recurrent, moderate: Secondary | ICD-10-CM | POA: Diagnosis not present

## 2013-08-31 ENCOUNTER — Other Ambulatory Visit: Payer: Self-pay | Admitting: Family Medicine

## 2013-08-31 NOTE — Telephone Encounter (Signed)
Med filled.  

## 2013-09-01 DIAGNOSIS — R1310 Dysphagia, unspecified: Secondary | ICD-10-CM | POA: Diagnosis not present

## 2013-09-01 DIAGNOSIS — K5901 Slow transit constipation: Secondary | ICD-10-CM | POA: Diagnosis not present

## 2013-09-01 DIAGNOSIS — R109 Unspecified abdominal pain: Secondary | ICD-10-CM | POA: Diagnosis not present

## 2013-09-05 ENCOUNTER — Ambulatory Visit (INDEPENDENT_AMBULATORY_CARE_PROVIDER_SITE_OTHER): Payer: Medicare Other | Admitting: Neurology

## 2013-09-05 ENCOUNTER — Encounter: Payer: Self-pay | Admitting: Neurology

## 2013-09-05 VITALS — BP 138/86 | HR 104 | Resp 18 | Ht 70.0 in | Wt 156.0 lb

## 2013-09-05 DIAGNOSIS — G250 Essential tremor: Secondary | ICD-10-CM

## 2013-09-05 DIAGNOSIS — F558 Abuse of other non-psychoactive substances: Secondary | ICD-10-CM

## 2013-09-05 DIAGNOSIS — G252 Other specified forms of tremor: Secondary | ICD-10-CM

## 2013-09-05 DIAGNOSIS — I2510 Atherosclerotic heart disease of native coronary artery without angina pectoris: Secondary | ICD-10-CM

## 2013-09-05 DIAGNOSIS — F1910 Other psychoactive substance abuse, uncomplicated: Secondary | ICD-10-CM

## 2013-09-05 DIAGNOSIS — G251 Drug-induced tremor: Secondary | ICD-10-CM

## 2013-09-05 DIAGNOSIS — F319 Bipolar disorder, unspecified: Secondary | ICD-10-CM

## 2013-09-05 MED ORDER — AMBULATORY NON FORMULARY MEDICATION: 1.0000 | Freq: Every day | Status: DC
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
## 2013-09-05 NOTE — Progress Notes (Signed)
Note faxed to [name] at [phone].

## 2013-09-18 NOTE — Assessment & Plan Note (Signed)
Chronic problem, following w/ urology- Dr Cipriano Bunker

## 2013-09-18 NOTE — Assessment & Plan Note (Signed)
Chronic problem, adequate control.  Asymptomatic.  Check labs.  No anticipated med changes.

## 2013-09-18 NOTE — Assessment & Plan Note (Signed)
Chronic problem, tolerating statin w/o difficulty.  Check labs.  Adjust meds prn  

## 2013-09-18 NOTE — Assessment & Plan Note (Signed)
Pt's PE is unchanged for pt- remains frail looking, sitting in wheelchair w/ obvious back deformities.  Due for colonoscopy- has appt w/ Dr Watt Climes next week.  Written screening schedule updated and given to pt.  Anticipatory guidance provided.

## 2013-09-18 NOTE — Assessment & Plan Note (Signed)
Following w/ [name].  Pt reports sxs are currently well controlled.

## 2013-09-18 NOTE — Assessment & Plan Note (Signed)
Chronic problem.  Following w/ Pulm.  Pt has had trouble getting meds due to cost, pt now enrolled in pt assistance.  Pt has quit smoking- applauded his efforts.  Will follow.

## 2013-09-18 NOTE — Assessment & Plan Note (Signed)
Chronic problem, following w/ Dr Tamala Julian (Cards).  Goal is risk reduction w/ BP and lipid control.  Will follow.

## 2013-09-27 ENCOUNTER — Other Ambulatory Visit: Payer: Self-pay | Admitting: Family Medicine

## 2013-09-27 NOTE — Telephone Encounter (Signed)
Med filled.  

## 2013-10-10 ENCOUNTER — Telehealth: Payer: Self-pay | Admitting: Neurology

## 2013-10-10 DIAGNOSIS — G253 Myoclonus: Secondary | ICD-10-CM

## 2013-10-10 DIAGNOSIS — G9340 Encephalopathy, unspecified: Secondary | ICD-10-CM

## 2013-10-10 NOTE — Telephone Encounter (Signed)
Pt wife needs to talk to someone please call [phone]

## 2013-10-10 NOTE — Telephone Encounter (Signed)
Left message on machine for patient to call back.

## 2013-10-11 DIAGNOSIS — G9340 Encephalopathy, unspecified: Secondary | ICD-10-CM | POA: Diagnosis not present

## 2013-10-11 DIAGNOSIS — G253 Myoclonus: Secondary | ICD-10-CM | POA: Diagnosis not present

## 2013-10-11 NOTE — Telephone Encounter (Signed)
These movements were different from tremor, jerking was 3-4 inches. He is taking his meds properly. The only medication he took differently was Nyquil the night prior to this occurrence.

## 2013-10-11 NOTE — Telephone Encounter (Signed)
Spoke with patient's wife and she states they called 911 as recommended by the after hours RN. They came out and said his vitals looked okay and it was up to them whether they wanted to be evaluated in the ER. They opted to stay home. At that time patient was having jerking movements in arms and legs with confusion and loss of time. He was not able to walk at that time. Yesterday he was much better. He is still having jerking movements but they are not as pronounced. He is now up walking around. Please advise if patient needs appt for evaluation.

## 2013-10-11 NOTE — Telephone Encounter (Signed)
Patient made aware we need some labs drawn. Order placed and faxed to [facility] lab. He was made aware to have drawn at [facility] this week.

## 2013-10-11 NOTE — Telephone Encounter (Signed)
Any new meds?  Had he taken other meds properly?  Is this different from normal tremor?

## 2013-10-11 NOTE — Telephone Encounter (Signed)
Could have been myoclonus.  Check BMP, LFT, ammonia, cbc with dx of myoclonus, encephalopathy

## 2013-10-11 NOTE — Telephone Encounter (Signed)
Malik Zuniga, will you try them again today as looks like they called answering service on 10/10/13 at 12:24 am and were advised to go to ED but I don't see they did in our system.  Just check up on them.

## 2013-10-12 ENCOUNTER — Encounter: Payer: Self-pay | Admitting: Pulmonary Disease

## 2013-10-12 ENCOUNTER — Ambulatory Visit (INDEPENDENT_AMBULATORY_CARE_PROVIDER_SITE_OTHER): Payer: Medicare Other | Admitting: Pulmonary Disease

## 2013-10-12 VITALS — BP 108/78 | HR 112 | Temp 98.2°F | Ht 70.0 in | Wt 153.8 lb

## 2013-10-12 DIAGNOSIS — S22009A Unspecified fracture of unspecified thoracic vertebra, initial encounter for closed fracture: Secondary | ICD-10-CM | POA: Diagnosis not present

## 2013-10-12 DIAGNOSIS — J439 Emphysema, unspecified: Secondary | ICD-10-CM

## 2013-10-12 DIAGNOSIS — J438 Other emphysema: Secondary | ICD-10-CM

## 2013-10-12 DIAGNOSIS — I2510 Atherosclerotic heart disease of native coronary artery without angina pectoris: Secondary | ICD-10-CM | POA: Diagnosis not present

## 2013-10-12 DIAGNOSIS — Z23 Encounter for immunization: Secondary | ICD-10-CM | POA: Diagnosis not present

## 2013-10-12 NOTE — Progress Notes (Signed)
   Subjective:    Patient ID: ROTH RESS, male    DOB: [birth year], 68 y.o.   MRN: [id number]  HPI Patient comes in today for followup of his known COPD. He is staying on a good bronchodilator regimen, and has not had any recent pulmonary infection or acute exacerbation. He feels that his exertional tolerance is at baseline, and has had no significant cough or mucus production.   Review of Systems  Constitutional: Negative for fever and unexpected weight change.  HENT: Negative for congestion, dental problem, ear pain, nosebleeds, postnasal drip, rhinorrhea, sinus pressure, sneezing, sore throat and trouble swallowing.   Eyes: Negative for redness and itching.  Respiratory: Positive for shortness of breath. Negative for cough, chest tightness and wheezing.   Cardiovascular: Negative for palpitations and leg swelling.  Gastrointestinal: Negative for nausea and vomiting.  Genitourinary: Negative for dysuria.  Musculoskeletal: Negative for joint swelling.  Skin: Negative for rash.  Neurological: Negative for headaches.  Hematological: Does not bruise/bleed easily.  Psychiatric/Behavioral: Negative for dysphoric mood. The patient is not nervous/anxious.        Objective:   Physical Exam Frail-appearing male in no acute distress Nose without purulence or discharge noted Neck without lymphadenopathy or thyromegaly Chest with mildly decreased breath sounds, no wheezes or crackles noted Cardiac exam with regular rate and rhythm Lower extremities without edema, no cyanosis Alert and oriented, moves all 4 extremities       Assessment & Plan:

## 2013-10-12 NOTE — Assessment & Plan Note (Signed)
The patient is stable from a pulmonary standpoint, and I've asked him to continue on his current bronchodilator regimen. I've also stressed to him the importance of trying to stay as active as he can to maintain conditioning. I will go ahead and give him a flu shot today as well.

## 2013-10-12 NOTE — Patient Instructions (Signed)
No change in your current medications Will give you the flu shot today followup with me again in 65mos.

## 2013-10-13 ENCOUNTER — Other Ambulatory Visit: Payer: Medicare Other

## 2013-10-13 LAB — HEPATIC FUNCTION PANEL
ALT: 10 U/L (ref 0–53)
AST: 15 U/L (ref 0–37)
Albumin: 3.9 g/dL (ref 3.5–5.2)
Alkaline Phosphatase: 77 U/L (ref 39–117)
BILIRUBIN INDIRECT: 0.2 mg/dL (ref 0.2–1.2)
Bilirubin, Direct: 0.1 mg/dL (ref 0.0–0.3)
Total Bilirubin: 0.3 mg/dL (ref 0.2–1.2)
Total Protein: 6.9 g/dL (ref 6.0–8.3)

## 2013-10-13 LAB — BASIC METABOLIC PANEL
BUN: 16 mg/dL (ref 6–23)
CALCIUM: 9.4 mg/dL (ref 8.4–10.5)
CO2: 24 mEq/L (ref 19–32)
Chloride: 90 mEq/L — ABNORMAL LOW (ref 96–112)
Creat: 0.65 mg/dL (ref 0.50–1.35)
Glucose, Bld: 89 mg/dL (ref 70–99)
Potassium: 5.3 mEq/L (ref 3.5–5.3)
Sodium: 128 mEq/L — ABNORMAL LOW (ref 135–145)

## 2013-10-13 LAB — CBC WITH DIFFERENTIAL/PLATELET
Basophils Absolute: 0.1 10*3/uL (ref 0.0–0.1)
Basophils Relative: 1 % (ref 0–1)
EOS PCT: 1 % (ref 0–5)
Eosinophils Absolute: 0.1 10*3/uL (ref 0.0–0.7)
HEMATOCRIT: 33.3 % — AB (ref 39.0–52.0)
Hemoglobin: 11.1 g/dL — ABNORMAL LOW (ref 13.0–17.0)
LYMPHS ABS: 1.8 10*3/uL (ref 0.7–4.0)
LYMPHS PCT: 26 % (ref 12–46)
MCH: 26.2 pg (ref 26.0–34.0)
MCHC: 33.3 g/dL (ref 30.0–36.0)
MCV: 78.5 fL (ref 78.0–100.0)
MONOS PCT: 9 % (ref 3–12)
Monocytes Absolute: 0.6 10*3/uL (ref 0.1–1.0)
Neutro Abs: 4.4 10*3/uL (ref 1.7–7.7)
Neutrophils Relative %: 63 % (ref 43–77)
Platelets: 462 10*3/uL — ABNORMAL HIGH (ref 150–400)
RBC: 4.24 MIL/uL (ref 4.22–5.81)
RDW: 16.5 % — ABNORMAL HIGH (ref 11.5–15.5)
WBC: 7 10*3/uL (ref 4.0–10.5)

## 2013-10-14 ENCOUNTER — Telehealth: Payer: Self-pay | Admitting: Neurology

## 2013-10-14 DIAGNOSIS — E7220 Disorder of urea cycle metabolism, unspecified: Secondary | ICD-10-CM

## 2013-10-14 DIAGNOSIS — E871 Hypo-osmolality and hyponatremia: Secondary | ICD-10-CM

## 2013-10-14 LAB — AMMONIA: Ammonia: 74 umol/L — ABNORMAL HIGH (ref 16–53)

## 2013-10-14 NOTE — Telephone Encounter (Signed)
Per Dr Tat Call the patient and make sure no longer having sx's. If not, then repeat ammonia, bmp, LFT next week with dx of hyperammonemia and hyponatremia but if he is still having the jerking then he needs to go to the ER and have evaluated. Discussed with Dr. [name].    Spoke with patient and he states he had one more episode after Sunday of abnormal movements on Wednesday evening, but none since. He was advised to have blood work repeated next week and if these symptoms restart he is to go to the ER. He agrees with this plan.

## 2013-10-16 ENCOUNTER — Encounter (HOSPITAL_COMMUNITY): Payer: Self-pay | Admitting: Emergency Medicine

## 2013-10-16 ENCOUNTER — Observation Stay (HOSPITAL_COMMUNITY)
Admission: EM | Admit: 2013-10-16 | Discharge: 2013-10-17 | Disposition: A | Discharge status: Home / Self Care | Payer: Medicare Other | Attending: Internal Medicine | Admitting: Internal Medicine

## 2013-10-16 ENCOUNTER — Observation Stay (HOSPITAL_COMMUNITY): Payer: Medicare Other

## 2013-10-16 ENCOUNTER — Emergency Department (HOSPITAL_COMMUNITY): Payer: Medicare Other

## 2013-10-16 DIAGNOSIS — Z808 Family history of malignant neoplasm of other organs or systems: Secondary | ICD-10-CM | POA: Diagnosis not present

## 2013-10-16 DIAGNOSIS — R5381 Other malaise: Secondary | ICD-10-CM | POA: Diagnosis present

## 2013-10-16 DIAGNOSIS — Z9981 Dependence on supplemental oxygen: Secondary | ICD-10-CM | POA: Diagnosis not present

## 2013-10-16 DIAGNOSIS — C61 Malignant neoplasm of prostate: Secondary | ICD-10-CM

## 2013-10-16 DIAGNOSIS — D485 Neoplasm of uncertain behavior of skin: Secondary | ICD-10-CM

## 2013-10-16 DIAGNOSIS — IMO0002 Reserved for concepts with insufficient information to code with codable children: Secondary | ICD-10-CM | POA: Insufficient documentation

## 2013-10-16 DIAGNOSIS — I6789 Other cerebrovascular disease: Secondary | ICD-10-CM

## 2013-10-16 DIAGNOSIS — J4489 Other specified chronic obstructive pulmonary disease: Secondary | ICD-10-CM | POA: Insufficient documentation

## 2013-10-16 DIAGNOSIS — J9610 Chronic respiratory failure, unspecified whether with hypoxia or hypercapnia: Secondary | ICD-10-CM | POA: Diagnosis not present

## 2013-10-16 DIAGNOSIS — I10 Essential (primary) hypertension: Secondary | ICD-10-CM

## 2013-10-16 DIAGNOSIS — G4700 Insomnia, unspecified: Secondary | ICD-10-CM | POA: Diagnosis not present

## 2013-10-16 DIAGNOSIS — F411 Generalized anxiety disorder: Secondary | ICD-10-CM | POA: Insufficient documentation

## 2013-10-16 DIAGNOSIS — Z8679 Personal history of other diseases of the circulatory system: Secondary | ICD-10-CM

## 2013-10-16 DIAGNOSIS — M545 Low back pain, unspecified: Secondary | ICD-10-CM

## 2013-10-16 DIAGNOSIS — J449 Chronic obstructive pulmonary disease, unspecified: Secondary | ICD-10-CM | POA: Diagnosis not present

## 2013-10-16 DIAGNOSIS — Z9089 Acquired absence of other organs: Secondary | ICD-10-CM | POA: Insufficient documentation

## 2013-10-16 DIAGNOSIS — Z7982 Long term (current) use of aspirin: Secondary | ICD-10-CM | POA: Insufficient documentation

## 2013-10-16 DIAGNOSIS — L700 Acne vulgaris: Secondary | ICD-10-CM

## 2013-10-16 DIAGNOSIS — Z9861 Coronary angioplasty status: Secondary | ICD-10-CM | POA: Diagnosis not present

## 2013-10-16 DIAGNOSIS — Z860100 Personal history of colon polyps, unspecified: Secondary | ICD-10-CM | POA: Diagnosis not present

## 2013-10-16 DIAGNOSIS — J438 Other emphysema: Secondary | ICD-10-CM

## 2013-10-16 DIAGNOSIS — Z87820 Personal history of traumatic brain injury: Secondary | ICD-10-CM | POA: Diagnosis not present

## 2013-10-16 DIAGNOSIS — E785 Hyperlipidemia, unspecified: Secondary | ICD-10-CM

## 2013-10-16 DIAGNOSIS — Z79899 Other long term (current) drug therapy: Secondary | ICD-10-CM | POA: Insufficient documentation

## 2013-10-16 DIAGNOSIS — G8929 Other chronic pain: Secondary | ICD-10-CM

## 2013-10-16 DIAGNOSIS — A498 Other bacterial infections of unspecified site: Secondary | ICD-10-CM

## 2013-10-16 DIAGNOSIS — Z0000 Encounter for general adult medical examination without abnormal findings: Secondary | ICD-10-CM

## 2013-10-16 DIAGNOSIS — Z8042 Family history of malignant neoplasm of prostate: Secondary | ICD-10-CM | POA: Insufficient documentation

## 2013-10-16 DIAGNOSIS — J189 Pneumonia, unspecified organism: Secondary | ICD-10-CM | POA: Diagnosis not present

## 2013-10-16 DIAGNOSIS — K5900 Constipation, unspecified: Secondary | ICD-10-CM

## 2013-10-16 DIAGNOSIS — A419 Sepsis, unspecified organism: Secondary | ICD-10-CM

## 2013-10-16 DIAGNOSIS — Z87891 Personal history of nicotine dependence: Secondary | ICD-10-CM | POA: Diagnosis not present

## 2013-10-16 DIAGNOSIS — T502X5A Adverse effect of carbonic-anhydrase inhibitors, benzothiadiazides and other diuretics, initial encounter: Secondary | ICD-10-CM | POA: Diagnosis not present

## 2013-10-16 DIAGNOSIS — E871 Hypo-osmolality and hyponatremia: Secondary | ICD-10-CM | POA: Diagnosis not present

## 2013-10-16 DIAGNOSIS — F3289 Other specified depressive episodes: Secondary | ICD-10-CM

## 2013-10-16 DIAGNOSIS — I950 Idiopathic hypotension: Secondary | ICD-10-CM

## 2013-10-16 DIAGNOSIS — I2510 Atherosclerotic heart disease of native coronary artery without angina pectoris: Secondary | ICD-10-CM

## 2013-10-16 DIAGNOSIS — I252 Old myocardial infarction: Secondary | ICD-10-CM | POA: Diagnosis not present

## 2013-10-16 DIAGNOSIS — R740 Nonspecific elevation of levels of transaminase and lactic acid dehydrogenase [LDH]: Secondary | ICD-10-CM

## 2013-10-16 DIAGNOSIS — Z8546 Personal history of malignant neoplasm of prostate: Secondary | ICD-10-CM | POA: Diagnosis not present

## 2013-10-16 DIAGNOSIS — F329 Major depressive disorder, single episode, unspecified: Secondary | ICD-10-CM | POA: Insufficient documentation

## 2013-10-16 DIAGNOSIS — I959 Hypotension, unspecified: Principal | ICD-10-CM

## 2013-10-16 DIAGNOSIS — F558 Abuse of other non-psychoactive substances: Secondary | ICD-10-CM

## 2013-10-16 DIAGNOSIS — F319 Bipolar disorder, unspecified: Secondary | ICD-10-CM

## 2013-10-16 DIAGNOSIS — I219 Acute myocardial infarction, unspecified: Secondary | ICD-10-CM

## 2013-10-16 DIAGNOSIS — J302 Other seasonal allergic rhinitis: Secondary | ICD-10-CM

## 2013-10-16 DIAGNOSIS — J439 Emphysema, unspecified: Secondary | ICD-10-CM

## 2013-10-16 DIAGNOSIS — M419 Scoliosis, unspecified: Secondary | ICD-10-CM

## 2013-10-16 DIAGNOSIS — G250 Essential tremor: Secondary | ICD-10-CM | POA: Insufficient documentation

## 2013-10-16 DIAGNOSIS — R259 Unspecified abnormal involuntary movements: Secondary | ICD-10-CM

## 2013-10-16 DIAGNOSIS — Z9189 Other specified personal risk factors, not elsewhere classified: Secondary | ICD-10-CM

## 2013-10-16 DIAGNOSIS — G8921 Chronic pain due to trauma: Secondary | ICD-10-CM | POA: Diagnosis not present

## 2013-10-16 DIAGNOSIS — B9620 Unspecified Escherichia coli [E. coli] as the cause of diseases classified elsewhere: Secondary | ICD-10-CM

## 2013-10-16 DIAGNOSIS — Z9079 Acquired absence of other genital organ(s): Secondary | ICD-10-CM | POA: Diagnosis not present

## 2013-10-16 DIAGNOSIS — R7401 Elevation of levels of liver transaminase levels: Secondary | ICD-10-CM

## 2013-10-16 DIAGNOSIS — D62 Acute posthemorrhagic anemia: Secondary | ICD-10-CM

## 2013-10-16 DIAGNOSIS — G252 Other specified forms of tremor: Secondary | ICD-10-CM

## 2013-10-16 DIAGNOSIS — R7881 Bacteremia: Secondary | ICD-10-CM

## 2013-10-16 DIAGNOSIS — K219 Gastro-esophageal reflux disease without esophagitis: Secondary | ICD-10-CM | POA: Diagnosis not present

## 2013-10-16 DIAGNOSIS — R1013 Epigastric pain: Secondary | ICD-10-CM

## 2013-10-16 DIAGNOSIS — B965 Pseudomonas (aeruginosa) (mallei) (pseudomallei) as the cause of diseases classified elsewhere: Secondary | ICD-10-CM

## 2013-10-16 DIAGNOSIS — R296 Repeated falls: Secondary | ICD-10-CM

## 2013-10-16 DIAGNOSIS — B379 Candidiasis, unspecified: Secondary | ICD-10-CM

## 2013-10-16 LAB — POC OCCULT BLOOD, ED: Fecal Occult Bld: NEGATIVE

## 2013-10-16 LAB — COMPREHENSIVE METABOLIC PANEL
ALT: 10 U/L (ref 0–53)
AST: 15 U/L (ref 0–37)
Albumin: 3.2 g/dL — ABNORMAL LOW (ref 3.5–5.2)
Alkaline Phosphatase: 71 U/L (ref 39–117)
Anion gap: 15 (ref 5–15)
BUN: 16 mg/dL (ref 6–23)
CALCIUM: 9.1 mg/dL (ref 8.4–10.5)
CO2: 23 meq/L (ref 19–32)
Chloride: 92 mEq/L — ABNORMAL LOW (ref 96–112)
Creatinine, Ser: 0.68 mg/dL (ref 0.50–1.35)
GLUCOSE: 118 mg/dL — AB (ref 70–99)
POTASSIUM: 4.4 meq/L (ref 3.7–5.3)
SODIUM: 130 meq/L — AB (ref 137–147)
TOTAL PROTEIN: 6.8 g/dL (ref 6.0–8.3)
Total Bilirubin: <0.2 mg/dL — ABNORMAL LOW (ref 0.3–1.2)

## 2013-10-16 LAB — URINALYSIS, ROUTINE W REFLEX MICROSCOPIC
BILIRUBIN URINE: NEGATIVE
GLUCOSE, UA: NEGATIVE mg/dL
Hgb urine dipstick: NEGATIVE
Ketones, ur: NEGATIVE mg/dL
Leukocytes, UA: NEGATIVE
Nitrite: NEGATIVE
Protein, ur: NEGATIVE mg/dL
Specific Gravity, Urine: 1.019 (ref 1.005–1.030)
UROBILINOGEN UA: 1 mg/dL (ref 0.0–1.0)
pH: 7 (ref 5.0–8.0)

## 2013-10-16 LAB — CBC WITH DIFFERENTIAL/PLATELET
BASOS PCT: 0 % (ref 0–1)
Basophils Absolute: 0 10*3/uL (ref 0.0–0.1)
EOS ABS: 0 10*3/uL (ref 0.0–0.7)
EOS PCT: 0 % (ref 0–5)
HCT: 31.8 % — ABNORMAL LOW (ref 39.0–52.0)
HEMOGLOBIN: 10.7 g/dL — AB (ref 13.0–17.0)
LYMPHS ABS: 2.4 10*3/uL (ref 0.7–4.0)
Lymphocytes Relative: 33 % (ref 12–46)
MCH: 26.9 pg (ref 26.0–34.0)
MCHC: 33.6 g/dL (ref 30.0–36.0)
MCV: 79.9 fL (ref 78.0–100.0)
MONOS PCT: 8 % (ref 3–12)
Monocytes Absolute: 0.6 10*3/uL (ref 0.1–1.0)
Neutro Abs: 4.3 10*3/uL (ref 1.7–7.7)
Neutrophils Relative %: 59 % (ref 43–77)
Platelets: 357 10*3/uL (ref 150–400)
RBC: 3.98 MIL/uL — AB (ref 4.22–5.81)
RDW: 15.4 % (ref 11.5–15.5)
WBC: 7.4 10*3/uL (ref 4.0–10.5)

## 2013-10-16 LAB — I-STAT TROPONIN, ED: Troponin i, poc: 0 ng/mL (ref 0.00–0.08)

## 2013-10-16 LAB — VALPROIC ACID LEVEL: Valproic Acid Lvl: 35.9 ug/mL — ABNORMAL LOW (ref 50.0–100.0)

## 2013-10-16 LAB — TSH: TSH: 1.43 u[IU]/mL (ref 0.350–4.500)

## 2013-10-16 LAB — I-STAT CG4 LACTIC ACID, ED: Lactic Acid, Venous: 1.49 mmol/L (ref 0.5–2.2)

## 2013-10-16 MED ORDER — PANTOPRAZOLE SODIUM 40 MG PO TBEC
40.0000 mg | DELAYED_RELEASE_TABLET | Freq: Every day | ORAL | Status: DC
  Administered 2013-10-16 – 2013-10-17 (×2): 40 mg via ORAL
  Filled 2013-10-16 (×2): qty 1

## 2013-10-16 MED ORDER — DIAZEPAM 5 MG PO TABS
5.0000 mg | ORAL_TABLET | Freq: Three times a day (TID) | ORAL | Status: DC
  Administered 2013-10-16 – 2013-10-17 (×3): 5 mg via ORAL
  Filled 2013-10-16 (×3): qty 1

## 2013-10-16 MED ORDER — DIPHENHYDRAMINE HCL 25 MG PO CAPS
25.0000 mg | ORAL_CAPSULE | Freq: Once | ORAL | Status: AC
  Administered 2013-10-16: 25 mg via ORAL
  Filled 2013-10-16: qty 1

## 2013-10-16 MED ORDER — ALBUTEROL SULFATE HFA 108 (90 BASE) MCG/ACT IN AERS: 2.0000 | INHALATION_SPRAY | RESPIRATORY_TRACT | Status: DC | PRN

## 2013-10-16 MED ORDER — ENOXAPARIN SODIUM 40 MG/0.4ML ~~LOC~~ SOLN
40.0000 mg | SUBCUTANEOUS | Status: DC
  Administered 2013-10-16: 40 mg via SUBCUTANEOUS
  Filled 2013-10-16 (×2): qty 0.4

## 2013-10-16 MED ORDER — CYCLOBENZAPRINE HCL 10 MG PO TABS: 10.0000 mg | ORAL_TABLET | Freq: Every day | ORAL | Status: DC | PRN

## 2013-10-16 MED ORDER — SODIUM CHLORIDE 0.9 % IV SOLN
INTRAVENOUS | Status: AC
  Administered 2013-10-16: 16:00:00 via INTRAVENOUS

## 2013-10-16 MED ORDER — HYDROCODONE-ACETAMINOPHEN 5-325 MG PO TABS
1.0000 | ORAL_TABLET | ORAL | Status: DC | PRN
  Administered 2013-10-16: 1 via ORAL
  Filled 2013-10-16: qty 1

## 2013-10-16 MED ORDER — ARIPIPRAZOLE 5 MG PO TABS
5.0000 mg | ORAL_TABLET | Freq: Every morning | ORAL | Status: DC
Start: 2013-10-17 — End: 2013-10-17
  Administered 2013-10-17: 5 mg via ORAL
  Filled 2013-10-16: qty 1

## 2013-10-16 MED ORDER — ONDANSETRON HCL 4 MG PO TABS: 4.0000 mg | ORAL_TABLET | Freq: Four times a day (QID) | ORAL | Status: DC | PRN

## 2013-10-16 MED ORDER — ASPIRIN-ACETAMINOPHEN-CAFFEINE 250-250-65 MG PO TABS
2.0000 | ORAL_TABLET | Freq: Four times a day (QID) | ORAL | Status: DC | PRN
  Administered 2013-10-17: 2 via ORAL
  Filled 2013-10-16: qty 2

## 2013-10-16 MED ORDER — DOCUSATE SODIUM 100 MG PO CAPS
200.0000 mg | ORAL_CAPSULE | Freq: Every day | ORAL | Status: DC
  Administered 2013-10-16: 200 mg via ORAL
  Filled 2013-10-16: qty 2

## 2013-10-16 MED ORDER — IOHEXOL 350 MG/ML SOLN
80.0000 mL | Freq: Once | INTRAVENOUS | Status: AC | PRN
  Administered 2013-10-16: 80 mL via INTRAVENOUS

## 2013-10-16 MED ORDER — SIMVASTATIN 40 MG PO TABS
40.0000 mg | ORAL_TABLET | Freq: Every evening | ORAL | Status: DC
Start: 2013-10-16 — End: 2013-10-17
  Administered 2013-10-16: 40 mg via ORAL
  Filled 2013-10-16 (×2): qty 1

## 2013-10-16 MED ORDER — BUDESONIDE-FORMOTEROL FUMARATE 160-4.5 MCG/ACT IN AERO
2.0000 | INHALATION_SPRAY | Freq: Two times a day (BID) | RESPIRATORY_TRACT | Status: DC
  Administered 2013-10-17: 2 via RESPIRATORY_TRACT
  Filled 2013-10-16: qty 6

## 2013-10-16 MED ORDER — VITAMIN D3 25 MCG (1000 UNIT) PO TABS
1000.0000 [IU] | ORAL_TABLET | Freq: Every morning | ORAL | Status: DC
  Administered 2013-10-17: 1000 [IU] via ORAL
  Filled 2013-10-16: qty 1

## 2013-10-16 MED ORDER — DIVALPROEX SODIUM ER 500 MG PO TB24
1000.0000 mg | ORAL_TABLET | Freq: Every day | ORAL | Status: DC
  Administered 2013-10-16: 1000 mg via ORAL
  Filled 2013-10-16 (×2): qty 2

## 2013-10-16 MED ORDER — ACETAMINOPHEN 325 MG PO TABS
650.0000 mg | ORAL_TABLET | Freq: Four times a day (QID) | ORAL | Status: DC | PRN
  Administered 2013-10-16: 650 mg via ORAL
  Filled 2013-10-16: qty 2

## 2013-10-16 MED ORDER — POLYETHYLENE GLYCOL 3350 17 G PO PACK
17.0000 g | PACK | Freq: Two times a day (BID) | ORAL | Status: DC
  Administered 2013-10-16 – 2013-10-17 (×2): 17 g via ORAL
  Filled 2013-10-16 (×3): qty 1

## 2013-10-16 MED ORDER — ONDANSETRON HCL 4 MG/2ML IJ SOLN: 4.0000 mg | Freq: Four times a day (QID) | INTRAMUSCULAR | Status: DC | PRN

## 2013-10-16 MED ORDER — SODIUM CHLORIDE 0.9 % IV BOLUS (SEPSIS)
2000.0000 mL | Freq: Once | INTRAVENOUS | Status: AC
  Administered 2013-10-16: 2000 mL via INTRAVENOUS

## 2013-10-16 MED ORDER — ACETAMINOPHEN 650 MG RE SUPP: 650.0000 mg | Freq: Four times a day (QID) | RECTAL | Status: DC | PRN

## 2013-10-16 MED ORDER — VITAMIN C 500 MG PO TABS
1000.0000 mg | ORAL_TABLET | Freq: Every day | ORAL | Status: DC
  Administered 2013-10-17: 1000 mg via ORAL
  Filled 2013-10-16: qty 2

## 2013-10-16 MED ORDER — MONTELUKAST SODIUM 10 MG PO TABS
10.0000 mg | ORAL_TABLET | Freq: Every day | ORAL | Status: DC
  Administered 2013-10-16: 10 mg via ORAL
  Filled 2013-10-16 (×2): qty 1

## 2013-10-16 MED ORDER — ALBUTEROL SULFATE (2.5 MG/3ML) 0.083% IN NEBU: 2.5000 mg | INHALATION_SOLUTION | RESPIRATORY_TRACT | Status: DC | PRN

## 2013-10-16 MED ORDER — PRIMIDONE 250 MG PO TABS
250.0000 mg | ORAL_TABLET | Freq: Two times a day (BID) | ORAL | Status: DC
  Administered 2013-10-16 – 2013-10-17 (×2): 250 mg via ORAL
  Filled 2013-10-16 (×3): qty 1

## 2013-10-16 MED ORDER — TIOTROPIUM BROMIDE MONOHYDRATE 18 MCG IN CAPS
18.0000 ug | ORAL_CAPSULE | Freq: Every day | RESPIRATORY_TRACT | Status: DC
  Administered 2013-10-17: 18 ug via RESPIRATORY_TRACT
  Filled 2013-10-16: qty 5

## 2013-10-16 MED ORDER — FLUTICASONE PROPIONATE 50 MCG/ACT NA SUSP
2.0000 | Freq: Every day | NASAL | Status: DC
  Filled 2013-10-16: qty 16

## 2013-10-16 MED ORDER — DULOXETINE HCL 60 MG PO CPEP
60.0000 mg | ORAL_CAPSULE | Freq: Every day | ORAL | Status: DC
  Administered 2013-10-17: 60 mg via ORAL
  Filled 2013-10-16: qty 1

## 2013-10-16 NOTE — ED Provider Notes (Signed)
CSN: [id number]     Arrival date & time 10/16/13  1017 History   First MD Initiated Contact with Patient 10/16/13 1100     Chief Complaint  Patient presents with  . Weakness     (Consider location/radiation/quality/duration/timing/severity/associated sxs/prior Treatment) HPI comPlains of generalized weakness and malaise since last night, becoming worse today at church. Up and feeling of generalized malaise. Denies diminished appetite. Denies blood per rectum. Denies fever, cough or shortness of breath. No other associated symptoms. Past Medical History  Diagnosis Date  . Hyperlipidemia     takes Simvastatin daily  . Tremor   . History of prostate cancer   . On home O2   . Neuromuscular disorder 1998    right carpal tunnel release  . Anemia associated with acute blood loss   . Cancer 2004    prostate  . GERD (gastroesophageal reflux disease)     takes Omeprazole daily  . Hypertension     takes Metoprolol daily as well as Hyzaar  . Constipation     takes Colace daily as well as Miralax  . Anxiety     takes Valium daily  . Depression     takes Cymbalta daily  . Emphysema of lung     Albuterol as needed;Symbicort daily and Singulair at bedtime  . Emphysema   . Myocardial infarction 04/1998  . Coronary artery disease   . Asthma   . Shortness of breath     with exertion  . History of bronchitis   . Aspiration pneumonia 2010  . Headache, chronic daily   . History of migraine     last migraine a couple of days ago;takes Excedrin Migraine  . Chronic back pain     compression fracture  . History of colon polyps   . Mood change     after Brain surgery mood changed and was placed on Depakote  . Insomnia     takes Benadryl nightly   Past Surgical History  Procedure Laterality Date  . Cholecystectomy  1996  . 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 Annette Stable  . Vascular stent  2000    Dr. Rollene Fare; he reports cardiac stent, but denies stent for PAD 06/15/13  . Hand surgery   1989    crushed thumb, Dr. Fredna Dow  . Ulnar nerve repair  1998    left arm, Dr. Fredna Dow  . Gastrostomy w/ feeding tube  2008    Dr. Watt Climes; only had for a few months  . Prostatectomy  04/2001    removal of prostate cancer, Dr. Janice Norrie  . Coronary angioplasty with stent placement  04/1998  . Carpal tunnel release  1998    right  . Spine surgery    . 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 Back surgery  08/2004; 02/2005; 04/2006; 06/2007; 7/20101    all by Dr. Annette Stable  . Esophagogastroduodenoscopy N/A 07/23/2012    Procedure: ESOPHAGOGASTRODUODENOSCOPY (EGD);  Surgeon: Jeryl Columbia, MD;  Location: [facility] ENDOSCOPY;  Service: Endoscopy;  Laterality: N/A;  buccini /ja  . Colonoscopy    . Back surgery  05/2013   Family History  Problem Relation Age of Onset  . Heart disease Mother   . Cancer Father     brain cancer and prostate cancer    History  Substance Use Topics  . Smoking status: Former Smoker -- 0.50 packs/day for 50 years    Types: Cigarettes    Quit date: 02/10/2013  .  Smokeless tobacco: Never Used     Comment: quit smoking 4-30months ago  . Alcohol Use: No    Review of Systems  Musculoskeletal: Positive for back pain, gait problem and myalgias.       Mostly wheelchair-bound able to walk a few steps  All other systems reviewed and are negative.     Allergies  Lisinopril; Ambien; and Lamictal  Home Medications   Prior to Admission medications   Medication Sig Start Date End Date Taking? Authorizing Provider  acetaminophen (TYLENOL) 325 MG tablet Take 3,000 mg by mouth daily. For pain    Historical Provider, MD  albuterol (PROAIR HFA) 108 (90 BASE) MCG/ACT inhaler Inhale 2 puffs into the lungs every 4 (four) hours as needed for wheezing. 11/04/12   [name], MD  ARIPiprazole (ABILIFY) 10 MG tablet Take 5 mg by mouth daily.    Historical Provider, MD  Ascorbic Acid (VITAMIN C) 500 MG tablet Take 1,000 mg by mouth daily.     Historical Provider, MD   aspirin-acetaminophen-caffeine (EXCEDRIN MIGRAINE) [phone] MG per tablet Take 2 tablets by mouth every 6 (six) hours as needed. For headaches    Historical Provider, MD  budesonide-formoterol (SYMBICORT) 160-4.5 MCG/ACT inhaler Inhale 2 puffs into the lungs 2 (two) times daily. 08/09/13   [name], MD  cholecalciferol (VITAMIN D) 1000 UNITS tablet Take 1,000 Units by mouth daily.    Historical Provider, MD  cyclobenzaprine (FLEXERIL) 10 MG tablet Take 10 mg by mouth 2 (two) times daily.    Historical Provider, MD  diazepam (VALIUM) 5 MG tablet Take 5 mg by mouth 3 (three) times daily. scheduled 06/22/13   Charlie Pitter, MD  divalproex (DEPAKOTE) 500 MG 24 hr tablet Take 1,000 mg by mouth at bedtime.     Historical Provider, MD  docusate sodium (COLACE) 100 MG capsule Take 200 mg by mouth at bedtime. Take 1 tablet in the morning and 2 tablets at  bedtime    Historical Provider, MD  DULoxetine (CYMBALTA) 60 MG capsule Take 60 mg by mouth daily.    Historical Provider, MD  fluticasone (FLONASE) 50 MCG/ACT nasal spray PLACE 2 SPRAYS INTO THE NOSE DAILY. 08/31/13   [name], MD  HYDROcodone-acetaminophen (NORCO/VICODIN) 5-325 MG per tablet Take 1-2 tablets by mouth 4 (four) times daily.     Historical Provider, MD  losartan-hydrochlorothiazide (HYZAAR) 50-12.5 MG per tablet Take 1 tablet by mouth daily. 08/25/13   Belva Crome III, MD  metoprolol succinate (TOPROL-XL) 50 MG 24 hr tablet TAKE 1 TABLET (50 MG TOTAL) BY MOUTH DAILY. TAKE WITH OR IMMEDIATELY FOLLOWING A MEAL.    [name], MD  montelukast (SINGULAIR) 10 MG tablet TAKE 1 TABLET (10 MG TOTAL) BY MOUTH AT BEDTIME. 06/14/13   [name], MD  nitroGLYCERIN (NITROSTAT) 0.4 MG SL tablet Place 1 tablet (0.4 mg total) under the tongue every 5 (five) minutes as needed for chest pain. 07/27/13   Belva Crome III, MD  Omega-3 Fatty Acids (FISH OIL) 1000 MG CAPS Take 1 capsule by mouth daily.    Historical Provider, MD   omeprazole (PRILOSEC) 40 MG capsule Take 40 mg by mouth 2 (two) times daily.     Historical Provider, MD  OVER THE COUNTER MEDICATION Take 1 tablet by mouth every 4 (four) hours as needed (for nausea. [employer] brand nausea relief).    Historical Provider, MD  OVER THE COUNTER MEDICATION Take 1 tablet by mouth at bedtime as needed (  for sleep. [employer] brand of sleep aid).    Historical Provider, MD  polyethylene glycol (MIRALAX / GLYCOLAX) packet Take 17 g by mouth 2 (two) times daily. For constipation 07/24/12   Delfina Redwood, MD  primidone (MYSOLINE) 250 MG tablet TAKE 1 TABLET BY MOUTH TWICE A DAY 09/27/13   [name], MD  Simethicone (MYLANTA GAS PO) Take 30 mLs by mouth daily as needed (for indigestion).     Historical Provider, MD  simvastatin (ZOCOR) 40 MG tablet TAKE 1 TABLET (40 MG TOTAL) BY MOUTH EVERY EVENING. 05/12/13   [name], MD  tiotropium (SPIRIVA HANDIHALER) 18 MCG inhalation capsule Place 1 capsule (18 mcg total) into inhaler and inhale daily. 08/23/13   [name], MD  Zinc 50 MG TABS Take 50 mg by mouth daily.     Historical Provider, MD   BP 88/53  Pulse 71  Temp(Src) 97.9 F (36.6 C) (Oral)  Resp 12  SpO2 96% Physical Exam  Nursing note and vitals reviewed. Constitutional:  Chronically ill appearing  HENT:  Head: Normocephalic and atraumatic.  Eyes: Conjunctivae are normal. Pupils are equal, round, and reactive to light.  Neck: Neck supple. No tracheal deviation present. No thyromegaly present.  Cardiovascular: Normal rate and regular rhythm.   No murmur heard. Pulmonary/Chest: Effort normal and breath sounds normal.  Abdominal: Soft. Bowel sounds are normal. He exhibits no distension. There is no tenderness.  Genitourinary: Rectum normal. Guaiac negative stool.  Normal tone brown stool Hemoccult negative  Musculoskeletal: Normal range of motion. He exhibits no edema and no tenderness.  Neurological: He is alert. Coordination normal.   Skin: Skin is warm and dry. No rash noted.  Psychiatric: He has a normal mood and affect.    ED Course  Procedures (including critical care time) Labs Review Labs Reviewed - No data to display  Imaging Review No results found.   EKG Interpretation   Date/Time:  Sunday October 16 2013 10:22:23 EDT Ventricular Rate:  76 PR Interval:  160 QRS Duration: 82 QT Interval:  412 QTC Calculation: 463 R Axis:   72 Text Interpretation:  Normal sinus rhythm Normal ECG No significant change  since last tracing Confirmed by Winfred Leeds  MD, [place] Scheiderer [phone]) on 10/16/2013  11:02:54 AM     Results for orders placed during the hospital encounter of 10/16/13  COMPREHENSIVE METABOLIC PANEL      Result Value Ref Range   Sodium 130 (*) 137 - 147 mEq/L   Potassium 4.4  3.7 - 5.3 mEq/L   Chloride 92 (*) 96 - 112 mEq/L   CO2 23  19 - 32 mEq/L   Glucose, Bld 118 (*) 70 - 99 mg/dL   BUN 16  6 - 23 mg/dL   Creatinine, Ser 0.68  0.50 - 1.35 mg/dL   Calcium 9.1  8.4 - 10.5 mg/dL   Total Protein 6.8  6.0 - 8.3 g/dL   Albumin 3.2 (*) 3.5 - 5.2 g/dL   AST 15  0 - 37 U/L   ALT 10  0 - 53 U/L   Alkaline Phosphatase 71  39 - 117 U/L   Total Bilirubin <0.2 (*) 0.3 - 1.2 mg/dL   GFR calc non Af Amer >90  >90 mL/min   GFR calc Af Amer >90  >90 mL/min   Anion gap 15  5 - 15  CBC WITH DIFFERENTIAL      Result Value Ref Range   WBC 7.4  4.0 - 10.5 K/uL  RBC 3.98 (*) 4.22 - 5.81 MIL/uL   Hemoglobin 10.7 (*) 13.0 - 17.0 g/dL   HCT 31.8 (*) 39.0 - 52.0 %   MCV 79.9  78.0 - 100.0 fL   MCH 26.9  26.0 - 34.0 pg   MCHC 33.6  30.0 - 36.0 g/dL   RDW 15.4  11.5 - 15.5 %   Platelets 357  150 - 400 K/uL   Neutrophils Relative % 59  43 - 77 %   Neutro Abs 4.3  1.7 - 7.7 K/uL   Lymphocytes Relative 33  12 - 46 %   Lymphs Abs 2.4  0.7 - 4.0 K/uL   Monocytes Relative 8  3 - 12 %   Monocytes Absolute 0.6  0.1 - 1.0 K/uL   Eosinophils Relative 0  0 - 5 %   Eosinophils Absolute 0.0  0.0 - 0.7 K/uL   Basophils  Relative 0  0 - 1 %   Basophils Absolute 0.0  0.0 - 0.1 K/uL  URINALYSIS, ROUTINE W REFLEX MICROSCOPIC      Result Value Ref Range   Color, Urine YELLOW  YELLOW   APPearance CLEAR  CLEAR   Specific Gravity, Urine 1.019  1.005 - 1.030   pH 7.0  5.0 - 8.0   Glucose, UA NEGATIVE  NEGATIVE mg/dL   Hgb urine dipstick NEGATIVE  NEGATIVE   Bilirubin Urine NEGATIVE  NEGATIVE   Ketones, ur NEGATIVE  NEGATIVE mg/dL   Protein, ur NEGATIVE  NEGATIVE mg/dL   Urobilinogen, UA 1.0  0.0 - 1.0 mg/dL   Nitrite NEGATIVE  NEGATIVE   Leukocytes, UA NEGATIVE  NEGATIVE  VALPROIC ACID LEVEL      Result Value Ref Range   Valproic Acid Lvl 35.9 (*) 50.0 - 100.0 ug/mL  POC OCCULT BLOOD, ED      Result Value Ref Range   Fecal Occult Bld NEGATIVE  NEGATIVE  I-STAT CG4 LACTIC ACID, ED      Result Value Ref Range   Lactic Acid, Venous 1.49  0.5 - 2.2 mmol/L  I-STAT TROPOININ, ED      Result Value Ref Range   Troponin i, poc 0.00  0.00 - 0.08 ng/mL   Comment 3            No results found.  MDM  Concern for hypotension. Possibilities included occult infection, cardiac dz, or poor regulation of medications. pts bp improved with iv fluids and at 115 pm feels improved with incresed bp.  Spoke with Dr. Daleen Bo, plan 23 hour observation med surg floor , Dr Daleen Bo requests cxr Final diagnoses:  None   Dx #1hypotension #2 hyponatremia     Orlie Dakin, MD 10/16/13 1322
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
## 2013-10-16 NOTE — ED Notes (Signed)
Pt appears very weak and pale at triage. Family reports pt having episodes of weakness and fatigue, last occurred this am when at [employer]. bp is 70/46 at triage.spo2 85%.

## 2013-10-17 DIAGNOSIS — I10 Essential (primary) hypertension: Secondary | ICD-10-CM | POA: Diagnosis not present

## 2013-10-17 DIAGNOSIS — J189 Pneumonia, unspecified organism: Secondary | ICD-10-CM

## 2013-10-17 DIAGNOSIS — I959 Hypotension, unspecified: Secondary | ICD-10-CM | POA: Diagnosis not present

## 2013-10-17 LAB — BASIC METABOLIC PANEL
Anion gap: 12 (ref 5–15)
BUN: 10 mg/dL (ref 6–23)
CO2: 23 mEq/L (ref 19–32)
Calcium: 9 mg/dL (ref 8.4–10.5)
Chloride: 97 mEq/L (ref 96–112)
Creatinine, Ser: 0.54 mg/dL (ref 0.50–1.35)
Glucose, Bld: 91 mg/dL (ref 70–99)
Potassium: 4.4 mEq/L (ref 3.7–5.3)
SODIUM: 132 meq/L — AB (ref 137–147)

## 2013-10-17 MED ORDER — LEVOFLOXACIN 750 MG PO TABS: 750.0000 mg | ORAL_TABLET | Freq: Every day | ORAL | Status: DC

## 2013-10-17 MED ORDER — ACETAMINOPHEN 325 MG PO TABS: 650.0000 mg | ORAL_TABLET | Freq: Four times a day (QID) | ORAL | Status: DC | PRN

## 2013-10-17 MED ORDER — LEVOFLOXACIN IN D5W 750 MG/150ML IV SOLN
750.0000 mg | Freq: Every day | INTRAVENOUS | Status: DC
  Administered 2013-10-17: 750 mg via INTRAVENOUS
  Filled 2013-10-17: qty 150

## 2013-10-17 NOTE — Progress Notes (Signed)
Patient given discharge paperwork. No questions verbalized. Patient ready for discharge.

## 2013-10-17 NOTE — Discharge Instructions (Signed)
Please follow up with primary care doctor in 1 week
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
## 2013-10-18 ENCOUNTER — Telehealth: Payer: Self-pay | Admitting: Neurology

## 2013-10-18 NOTE — Telephone Encounter (Signed)
They didn't repeat the ammonia so that will still need done.  The sodium looked good.

## 2013-10-18 NOTE — Telephone Encounter (Signed)
Pt's spouse called requesting to speak to Malik Zuniga [facility] regarding her spouse being admitted to [facility] on Sunday 10/16/13 in the morning. Pt was dismissed yesterday 10/17/13. Pt's spouse wants to confirm if he still needs to have blood work since he has pneumonia and is on antibiotics. C/B  [phone]

## 2013-10-18 NOTE — Telephone Encounter (Signed)
Please advise 

## 2013-10-18 NOTE — Addendum Note (Signed)
Addended byAnnamaria Helling on: 10/18/2013 12:59 PM   Modules accepted: Orders

## 2013-10-18 NOTE — Telephone Encounter (Signed)
Patient's wife made aware to go ahead and get labs drawn. Cancelled other labs other than Ammonia.

## 2013-10-19 ENCOUNTER — Telehealth: Payer: Self-pay

## 2013-10-19 NOTE — Telephone Encounter (Signed)
Admit date: 10/16/2013  Discharge date: 10/17/2013  Reason for admission: Pneumonia and hypotension  Recommendation: cont Outpatient follow, CXR in 4-6 weeks to document resolution  Information provided mostly by wife.  Transition Care Management Follow-up Telephone Call  How have you been since you were released from the hospital? Pt states he feels better.  Afebrile. No increased shortness of breath (hx. COPD).  No cough per his wife.  Wife states his tremors has increased since his d/c.  Reason unknown.      Do you understand why you were in the hospital? yes   Do you understand the discharge instrcutions? yes  Items Reviewed:  Medications reviewed: yes; wife states she had stopped the metoprolol and not the losartan-hydrochlorothiazide.  Wife was instructed to stop taking losartan-hydrochlorothiazide and restart metoprolol (per discharge summary).  She was also encouraged to monitor pt's blood pressure at least daily to keep a check on his blood pressure.  Wife states that pt has started levofloxacin 750 mg daily.  Allergies reviewed: yes  Dietary changes reviewed: yes, low sodium heart healthy diet  Referrals reviewed: n/a    Functional Questionnaire:   Activities of Daily Living (ADLs):   He states they are independent in the following: bathing and hygiene, feeding, continence, grooming, toileting and dressing; wife states that he has had some difficulty with feeding self today due to tremors.   States they require assistance with the following: ambulation; able to walk short distances otherwise uses wheelchair when out in public and cane in the house.  Has walker, but does not use.     Any transportation issues/concerns?: yes   Any patient concerns? yes   Confirmed importance and date/time of follow-up visits scheduled: yes   Confirmed with patient if condition begins to worsen call PCP or go to the ER.  Patient was given the Call-a-Nurse line [phone]:  yes  Appointment scheduled on 10/28/13 @ 1000 am with Dr. [name].

## 2013-10-20 ENCOUNTER — Other Ambulatory Visit: Payer: Self-pay | Admitting: Neurology

## 2013-10-20 DIAGNOSIS — E7220 Disorder of urea cycle metabolism, unspecified: Secondary | ICD-10-CM | POA: Diagnosis not present

## 2013-10-20 DIAGNOSIS — E871 Hypo-osmolality and hyponatremia: Secondary | ICD-10-CM | POA: Diagnosis not present

## 2013-10-20 LAB — AMMONIA: AMMONIA: 55 umol/L — AB (ref 16–53)

## 2013-10-21 NOTE — Telephone Encounter (Signed)
Addition of Mucinex will help thin congestion.  Should also contact his pulmonologist and notify him of recent admission for Pneumonia

## 2013-10-21 NOTE — Telephone Encounter (Signed)
Caller name: Ilya, [place] Relation to pt: self  Call back number: [phone]   Reason for call:   pt is extremely congested and would like you to prescribe/suggest an OTC medication. Pt does have a hospital f/u 10/28/13.

## 2013-10-21 NOTE — Telephone Encounter (Signed)
C/o: chest congestion, non-productive cough, shortness of breath (but able to speak without difficulty), feels "warm, and chills.  Symptoms started this morning. Still taking Levaquin.  Has been taking Symbicort, Spiriva, Singulair, and albuterol.  He's calling for over-the-counter suggestions for congestion.    Advice:  Pt was advised to try Delsym or Robitussin and to increase fluids to thin liquids.   Would you suggest anything else Dr. [name]?

## 2013-10-21 NOTE — Telephone Encounter (Signed)
Dr. Virgil Benedict recommendations were discussed with patient's wife.  She stated understanding and agreed with plan.

## 2013-10-23 ENCOUNTER — Inpatient Hospital Stay (HOSPITAL_COMMUNITY)
Admission: EM | Admit: 2013-10-23 | Discharge: 2013-10-27 | DRG: 194 | Disposition: A | Discharge status: Home / Self Care | Payer: Medicare Other | Attending: Internal Medicine | Admitting: Internal Medicine

## 2013-10-23 ENCOUNTER — Emergency Department (HOSPITAL_COMMUNITY): Payer: Medicare Other

## 2013-10-23 ENCOUNTER — Encounter (HOSPITAL_COMMUNITY): Payer: Self-pay | Admitting: Emergency Medicine

## 2013-10-23 DIAGNOSIS — D72829 Elevated white blood cell count, unspecified: Secondary | ICD-10-CM | POA: Diagnosis present

## 2013-10-23 DIAGNOSIS — K5900 Constipation, unspecified: Secondary | ICD-10-CM | POA: Diagnosis present

## 2013-10-23 DIAGNOSIS — M4120 Other idiopathic scoliosis, site unspecified: Secondary | ICD-10-CM | POA: Diagnosis not present

## 2013-10-23 DIAGNOSIS — Z8546 Personal history of malignant neoplasm of prostate: Secondary | ICD-10-CM | POA: Diagnosis not present

## 2013-10-23 DIAGNOSIS — IMO0002 Reserved for concepts with insufficient information to code with codable children: Secondary | ICD-10-CM

## 2013-10-23 DIAGNOSIS — I498 Other specified cardiac arrhythmias: Secondary | ICD-10-CM | POA: Diagnosis present

## 2013-10-23 DIAGNOSIS — E785 Hyperlipidemia, unspecified: Secondary | ICD-10-CM | POA: Diagnosis present

## 2013-10-23 DIAGNOSIS — E441 Mild protein-calorie malnutrition: Secondary | ICD-10-CM | POA: Diagnosis present

## 2013-10-23 DIAGNOSIS — Z9981 Dependence on supplemental oxygen: Secondary | ICD-10-CM | POA: Diagnosis not present

## 2013-10-23 DIAGNOSIS — E871 Hypo-osmolality and hyponatremia: Secondary | ICD-10-CM | POA: Diagnosis present

## 2013-10-23 DIAGNOSIS — G8929 Other chronic pain: Secondary | ICD-10-CM | POA: Diagnosis present

## 2013-10-23 DIAGNOSIS — F411 Generalized anxiety disorder: Secondary | ICD-10-CM | POA: Diagnosis present

## 2013-10-23 DIAGNOSIS — R259 Unspecified abnormal involuntary movements: Secondary | ICD-10-CM | POA: Diagnosis not present

## 2013-10-23 DIAGNOSIS — I10 Essential (primary) hypertension: Secondary | ICD-10-CM | POA: Diagnosis not present

## 2013-10-23 DIAGNOSIS — G252 Other specified forms of tremor: Secondary | ICD-10-CM

## 2013-10-23 DIAGNOSIS — R000 Tachycardia, unspecified: Secondary | ICD-10-CM | POA: Diagnosis not present

## 2013-10-23 DIAGNOSIS — M545 Low back pain, unspecified: Secondary | ICD-10-CM | POA: Diagnosis present

## 2013-10-23 DIAGNOSIS — M419 Scoliosis, unspecified: Secondary | ICD-10-CM

## 2013-10-23 DIAGNOSIS — Z9861 Coronary angioplasty status: Secondary | ICD-10-CM | POA: Diagnosis not present

## 2013-10-23 DIAGNOSIS — Z8673 Personal history of transient ischemic attack (TIA), and cerebral infarction without residual deficits: Secondary | ICD-10-CM

## 2013-10-23 DIAGNOSIS — G250 Essential tremor: Secondary | ICD-10-CM | POA: Diagnosis present

## 2013-10-23 DIAGNOSIS — J438 Other emphysema: Secondary | ICD-10-CM

## 2013-10-23 DIAGNOSIS — J449 Chronic obstructive pulmonary disease, unspecified: Secondary | ICD-10-CM | POA: Diagnosis present

## 2013-10-23 DIAGNOSIS — R918 Other nonspecific abnormal finding of lung field: Secondary | ICD-10-CM | POA: Diagnosis not present

## 2013-10-23 DIAGNOSIS — I2510 Atherosclerotic heart disease of native coronary artery without angina pectoris: Secondary | ICD-10-CM | POA: Diagnosis present

## 2013-10-23 DIAGNOSIS — I252 Old myocardial infarction: Secondary | ICD-10-CM | POA: Diagnosis not present

## 2013-10-23 DIAGNOSIS — J189 Pneumonia, unspecified organism: Secondary | ICD-10-CM | POA: Diagnosis present

## 2013-10-23 DIAGNOSIS — K219 Gastro-esophageal reflux disease without esophagitis: Secondary | ICD-10-CM | POA: Diagnosis present

## 2013-10-23 DIAGNOSIS — G4700 Insomnia, unspecified: Secondary | ICD-10-CM | POA: Diagnosis present

## 2013-10-23 DIAGNOSIS — Z87891 Personal history of nicotine dependence: Secondary | ICD-10-CM

## 2013-10-23 DIAGNOSIS — R0602 Shortness of breath: Secondary | ICD-10-CM | POA: Diagnosis not present

## 2013-10-23 DIAGNOSIS — Z79899 Other long term (current) drug therapy: Secondary | ICD-10-CM

## 2013-10-23 DIAGNOSIS — J430 Unilateral pulmonary emphysema [MacLeod's syndrome]: Secondary | ICD-10-CM

## 2013-10-23 DIAGNOSIS — J439 Emphysema, unspecified: Secondary | ICD-10-CM

## 2013-10-23 DIAGNOSIS — F319 Bipolar disorder, unspecified: Secondary | ICD-10-CM | POA: Diagnosis present

## 2013-10-23 LAB — BASIC METABOLIC PANEL
Anion gap: 13 (ref 5–15)
BUN: 14 mg/dL (ref 6–23)
CHLORIDE: 95 meq/L — AB (ref 96–112)
CO2: 24 meq/L (ref 19–32)
CREATININE: 0.65 mg/dL (ref 0.50–1.35)
Calcium: 9 mg/dL (ref 8.4–10.5)
GFR calc Af Amer: >90 mL/min (ref >90)
GFR calc non Af Amer: >90 mL/min (ref >90)
Glucose, Bld: 77 mg/dL (ref 70–99)
Potassium: 4.3 mEq/L (ref 3.7–5.3)
Sodium: 132 mEq/L — ABNORMAL LOW (ref 137–147)

## 2013-10-23 LAB — URINALYSIS, ROUTINE W REFLEX MICROSCOPIC
Bilirubin Urine: NEGATIVE
Glucose, UA: NEGATIVE mg/dL
Hgb urine dipstick: NEGATIVE
Ketones, ur: NEGATIVE mg/dL
LEUKOCYTES UA: NEGATIVE
Nitrite: NEGATIVE
PH: 6 (ref 5.0–8.0)
PROTEIN: NEGATIVE mg/dL
Specific Gravity, Urine: 1.021 (ref 1.005–1.030)
Urobilinogen, UA: 0.2 mg/dL (ref 0.0–1.0)

## 2013-10-23 LAB — CBC WITH DIFFERENTIAL/PLATELET
BASOS ABS: 0 10*3/uL (ref 0.0–0.1)
Basophils Relative: 0 % (ref 0–1)
Eosinophils Absolute: 0.2 10*3/uL (ref 0.0–0.7)
Eosinophils Relative: 2 % (ref 0–5)
HCT: 31 % — ABNORMAL LOW (ref 39.0–52.0)
HEMOGLOBIN: 10.3 g/dL — AB (ref 13.0–17.0)
Lymphocytes Relative: 8 % — ABNORMAL LOW (ref 12–46)
Lymphs Abs: 0.8 10*3/uL (ref 0.7–4.0)
MCH: 26.3 pg (ref 26.0–34.0)
MCHC: 33.2 g/dL (ref 30.0–36.0)
MCV: 79.3 fL (ref 78.0–100.0)
MONOS PCT: 5 % (ref 3–12)
Monocytes Absolute: 0.6 10*3/uL (ref 0.1–1.0)
NEUTROS ABS: 8.7 10*3/uL — AB (ref 1.7–7.7)
Neutrophils Relative %: 85 % — ABNORMAL HIGH (ref 43–77)
Platelets: 295 10*3/uL (ref 150–400)
RBC: 3.91 MIL/uL — ABNORMAL LOW (ref 4.22–5.81)
RDW: 15.6 % — ABNORMAL HIGH (ref 11.5–15.5)
WBC: 10.2 10*3/uL (ref 4.0–10.5)

## 2013-10-23 LAB — LACTIC ACID, PLASMA: Lactic Acid, Venous: 1.3 mmol/L (ref 0.5–2.2)

## 2013-10-23 LAB — RAPID STREP SCREEN (MED CTR MEBANE ONLY): Streptococcus, Group A Screen (Direct): NEGATIVE

## 2013-10-23 LAB — TSH: TSH: 1.84 u[IU]/mL (ref 0.350–4.500)

## 2013-10-23 MED ORDER — ONDANSETRON HCL 4 MG/2ML IJ SOLN: 4.0000 mg | Freq: Four times a day (QID) | INTRAMUSCULAR | Status: DC | PRN

## 2013-10-23 MED ORDER — SODIUM CHLORIDE 0.9 % IV BOLUS (SEPSIS)
500.0000 mL | Freq: Once | INTRAVENOUS | Status: AC
  Administered 2013-10-23: 500 mL via INTRAVENOUS

## 2013-10-23 MED ORDER — ENOXAPARIN SODIUM 40 MG/0.4ML ~~LOC~~ SOLN
40.0000 mg | SUBCUTANEOUS | Status: DC
  Administered 2013-10-23 – 2013-10-26 (×4): 40 mg via SUBCUTANEOUS
  Filled 2013-10-23 (×5): qty 0.4

## 2013-10-23 MED ORDER — HYDROCODONE-ACETAMINOPHEN 5-325 MG PO TABS
1.0000 | ORAL_TABLET | Freq: Four times a day (QID) | ORAL | Status: DC | PRN
  Administered 2013-10-23 – 2013-10-24 (×3): 1 via ORAL
  Filled 2013-10-23 (×4): qty 1

## 2013-10-23 MED ORDER — METOPROLOL SUCCINATE ER 50 MG PO TB24
50.0000 mg | ORAL_TABLET | Freq: Every morning | ORAL | Status: DC
  Administered 2013-10-24 – 2013-10-27 (×4): 50 mg via ORAL
  Filled 2013-10-23 (×5): qty 1

## 2013-10-23 MED ORDER — SODIUM CHLORIDE 0.9 % IJ SOLN
3.0000 mL | Freq: Two times a day (BID) | INTRAMUSCULAR | Status: DC
  Administered 2013-10-23 – 2013-10-26 (×6): 3 mL via INTRAVENOUS

## 2013-10-23 MED ORDER — CYCLOBENZAPRINE HCL 10 MG PO TABS: 10.0000 mg | ORAL_TABLET | Freq: Every day | ORAL | Status: DC | PRN

## 2013-10-23 MED ORDER — ARIPIPRAZOLE 5 MG PO TABS
5.0000 mg | ORAL_TABLET | Freq: Every morning | ORAL | Status: DC
  Administered 2013-10-24 – 2013-10-27 (×4): 5 mg via ORAL
  Filled 2013-10-23 (×5): qty 1

## 2013-10-23 MED ORDER — ACETAMINOPHEN 325 MG PO TABS
650.0000 mg | ORAL_TABLET | Freq: Four times a day (QID) | ORAL | Status: DC | PRN
  Administered 2013-10-23 – 2013-10-26 (×4): 650 mg via ORAL
  Filled 2013-10-23 (×5): qty 2

## 2013-10-23 MED ORDER — SODIUM CHLORIDE 0.9 % IV SOLN: INTRAVENOUS | Status: DC

## 2013-10-23 MED ORDER — DIAZEPAM 5 MG PO TABS
5.0000 mg | ORAL_TABLET | Freq: Three times a day (TID) | ORAL | Status: DC | PRN
  Administered 2013-10-23 – 2013-10-25 (×4): 5 mg via ORAL
  Filled 2013-10-23 (×4): qty 1

## 2013-10-23 MED ORDER — CEFEPIME HCL 1 G IJ SOLR
1.0000 g | Freq: Once | INTRAMUSCULAR | Status: AC
  Administered 2013-10-23: 1 g via INTRAVENOUS
  Filled 2013-10-23: qty 1

## 2013-10-23 MED ORDER — DOCUSATE SODIUM 100 MG PO CAPS
200.0000 mg | ORAL_CAPSULE | Freq: Every day | ORAL | Status: DC
  Administered 2013-10-23 – 2013-10-26 (×4): 200 mg via ORAL
  Filled 2013-10-23 (×5): qty 2

## 2013-10-23 MED ORDER — NITROGLYCERIN 0.4 MG SL SUBL: 0.4000 mg | SUBLINGUAL_TABLET | SUBLINGUAL | Status: DC | PRN

## 2013-10-23 MED ORDER — MORPHINE SULFATE 2 MG/ML IJ SOLN
2.0000 mg | INTRAMUSCULAR | Status: DC | PRN
  Administered 2013-10-24 (×3): 2 mg via INTRAVENOUS
  Filled 2013-10-23 (×4): qty 1

## 2013-10-23 MED ORDER — TIOTROPIUM BROMIDE MONOHYDRATE 18 MCG IN CAPS
18.0000 ug | ORAL_CAPSULE | Freq: Every day | RESPIRATORY_TRACT | Status: DC
  Administered 2013-10-24 – 2013-10-27 (×4): 18 ug via RESPIRATORY_TRACT
  Filled 2013-10-23: qty 5

## 2013-10-23 MED ORDER — PRIMIDONE 50 MG PO TABS
50.0000 mg | ORAL_TABLET | Freq: Every day | ORAL | Status: DC
Start: 2013-10-23 — End: 2013-10-23

## 2013-10-23 MED ORDER — BUDESONIDE-FORMOTEROL FUMARATE 160-4.5 MCG/ACT IN AERO
2.0000 | INHALATION_SPRAY | Freq: Two times a day (BID) | RESPIRATORY_TRACT | Status: DC
  Administered 2013-10-24 – 2013-10-27 (×8): 2 via RESPIRATORY_TRACT
  Filled 2013-10-23: qty 6

## 2013-10-23 MED ORDER — PRIMIDONE 250 MG PO TABS
250.0000 mg | ORAL_TABLET | Freq: Two times a day (BID) | ORAL | Status: DC
  Administered 2013-10-23 – 2013-10-27 (×8): 250 mg via ORAL
  Filled 2013-10-23 (×9): qty 1

## 2013-10-23 MED ORDER — OXYCODONE HCL 5 MG PO TABS: 5.0000 mg | ORAL_TABLET | ORAL | Status: DC | PRN

## 2013-10-23 MED ORDER — MONTELUKAST SODIUM 10 MG PO TABS
10.0000 mg | ORAL_TABLET | Freq: Every day | ORAL | Status: DC
  Administered 2013-10-23 – 2013-10-26 (×4): 10 mg via ORAL
  Filled 2013-10-23 (×5): qty 1

## 2013-10-23 MED ORDER — SODIUM CHLORIDE 0.9 % IV SOLN
INTRAVENOUS | Status: DC
  Administered 2013-10-23: 13:00:00 via INTRAVENOUS

## 2013-10-23 MED ORDER — VANCOMYCIN HCL IN DEXTROSE 750-5 MG/150ML-% IV SOLN
750.0000 mg | Freq: Three times a day (TID) | INTRAVENOUS | Status: DC
  Administered 2013-10-23 – 2013-10-26 (×9): 750 mg via INTRAVENOUS
  Filled 2013-10-23 (×12): qty 150

## 2013-10-23 MED ORDER — LEVALBUTEROL HCL 0.63 MG/3ML IN NEBU
0.6300 mg | INHALATION_SOLUTION | Freq: Four times a day (QID) | RESPIRATORY_TRACT | Status: DC | PRN
  Administered 2013-10-24 – 2013-10-25 (×2): 0.63 mg via RESPIRATORY_TRACT
  Filled 2013-10-23: qty 3

## 2013-10-23 MED ORDER — SODIUM CHLORIDE 0.9 % IV SOLN
INTRAVENOUS | Status: DC
  Administered 2013-10-23 – 2013-10-24 (×3): via INTRAVENOUS

## 2013-10-23 MED ORDER — ONDANSETRON HCL 4 MG PO TABS: 4.0000 mg | ORAL_TABLET | Freq: Four times a day (QID) | ORAL | Status: DC | PRN

## 2013-10-23 MED ORDER — DIVALPROEX SODIUM ER 500 MG PO TB24
1000.0000 mg | ORAL_TABLET | Freq: Every day | ORAL | Status: DC
  Administered 2013-10-23 – 2013-10-26 (×4): 1000 mg via ORAL
  Filled 2013-10-23 (×5): qty 2

## 2013-10-23 MED ORDER — DEXTROSE 5 % IV SOLN
1.0000 g | Freq: Two times a day (BID) | INTRAVENOUS | Status: DC
  Administered 2013-10-23 – 2013-10-26 (×7): 1 g via INTRAVENOUS
  Filled 2013-10-23 (×9): qty 1

## 2013-10-23 MED ORDER — POLYETHYLENE GLYCOL 3350 17 G PO PACK
17.0000 g | PACK | Freq: Two times a day (BID) | ORAL | Status: DC
  Administered 2013-10-23 – 2013-10-27 (×8): 17 g via ORAL
  Filled 2013-10-23 (×10): qty 1

## 2013-10-23 MED ORDER — ALUM & MAG HYDROXIDE-SIMETH 200-200-20 MG/5ML PO SUSP: 30.0000 mL | Freq: Four times a day (QID) | ORAL | Status: DC | PRN

## 2013-10-23 MED ORDER — DULOXETINE HCL 60 MG PO CPEP
60.0000 mg | ORAL_CAPSULE | Freq: Every day | ORAL | Status: DC
  Administered 2013-10-24 – 2013-10-27 (×4): 60 mg via ORAL
  Filled 2013-10-23 (×4): qty 1

## 2013-10-23 MED ORDER — ALBUTEROL SULFATE (2.5 MG/3ML) 0.083% IN NEBU: 2.5000 mg | INHALATION_SOLUTION | RESPIRATORY_TRACT | Status: AC | PRN

## 2013-10-23 MED ORDER — DEXTROSE 5 % IV SOLN
1.0000 g | Freq: Two times a day (BID) | INTRAVENOUS | Status: DC
  Filled 2013-10-23 (×2): qty 1

## 2013-10-23 MED ORDER — PANTOPRAZOLE SODIUM 40 MG PO TBEC
40.0000 mg | DELAYED_RELEASE_TABLET | Freq: Every day | ORAL | Status: DC
  Administered 2013-10-23 – 2013-10-27 (×5): 40 mg via ORAL
  Filled 2013-10-23 (×5): qty 1

## 2013-10-23 MED ORDER — DIPHENHYDRAMINE HCL 25 MG PO CAPS
25.0000 mg | ORAL_CAPSULE | Freq: Once | ORAL | Status: AC
  Administered 2013-10-23: 25 mg via ORAL
  Filled 2013-10-23: qty 1

## 2013-10-23 MED ORDER — SIMVASTATIN 40 MG PO TABS
40.0000 mg | ORAL_TABLET | Freq: Every evening | ORAL | Status: DC
  Administered 2013-10-23 – 2013-10-26 (×4): 40 mg via ORAL
  Filled 2013-10-23 (×5): qty 1

## 2013-10-23 NOTE — Progress Notes (Signed)
Patient was complaining he had a hard time having to urinate last night and not being able to use the urinal. Condom catheter was placed 2015. Will continue to monitor patient at catheter.

## 2013-10-23 NOTE — ED Notes (Signed)
Patient returned from X-ray 

## 2013-10-23 NOTE — Progress Notes (Signed)
ANTIBIOTIC CONSULT NOTE - INITIAL  Pharmacy Consult for Vancomycin Indication: pneumonia  Allergies  Allergen Reactions  . Lisinopril Swelling    ANGIOEDEMA  . Ambien [Zolpidem] Other (See Comments)    Unknown reaction  . Lamictal [Lamotrigine] Other (See Comments)    Weakness/difficulty swallowing    Patient Measurements: Height: 5\' 10"  (177.8 cm) Weight: 156 lb 15.5 oz (71.2 kg) IBW/kg (Calculated) : 73  Vital Signs: Temp: 98.4 F (36.9 C) (09/13 1405) Temp src: Oral (09/13 1405) BP: 130/80 mmHg (09/13 1405) Pulse Rate: 129 (09/13 1405) Intake/Output from previous day:   Intake/Output from this shift: Total I/O In: -  Out: 400 [Urine:400]  Labs:  Recent Labs  10/23/13 1050  WBC 10.2  HGB 10.3*  PLT 295  CREATININE 0.65   Estimated Creatinine Clearance: 89 ml/min (by C-G formula based on Cr of 0.65). No results found for this basename: VANCOTROUGH, Corlis Leak, VANCORANDOM, [place], GENTPEAK, GENTRANDOM, TOBRATROUGH, TOBRAPEAK, TOBRARND, AMIKACINPEAK, AMIKACINTROU, AMIKACIN,  in the last 72 hours   Microbiology: Recent Results (from the past 720 hour(s))  RAPID STREP SCREEN     Status: None   Collection Time    10/23/13 10:45 AM      Result Value Ref Range Status   Streptococcus, Group A Screen (Direct) NEGATIVE  NEGATIVE Final   Comment: (NOTE)     A Rapid Antigen test may result negative if the antigen level in the     sample is below the detection level of this test. The FDA has not     cleared this test as a stand-alone test therefore the rapid antigen     negative result has reflexed to a Group A Strep culture.    Medical History: Past Medical History  Diagnosis Date  . Hyperlipidemia     takes Simvastatin daily  . Tremor   . History of prostate cancer   . On home O2   . Neuromuscular disorder 1998    right carpal tunnel release  . Anemia associated with acute blood loss   . Cancer 2004    prostate  . GERD (gastroesophageal reflux  disease)     takes Omeprazole daily  . Hypertension     takes Metoprolol daily as well as Hyzaar  . Constipation     takes Colace daily as well as Miralax  . Anxiety     takes Valium daily  . Depression     takes Cymbalta daily  . Emphysema of lung     Albuterol as needed;Symbicort daily and Singulair at bedtime  . Emphysema   . Myocardial infarction 04/1998  . Coronary artery disease   . Asthma   . Shortness of breath     with exertion  . History of bronchitis   . Aspiration pneumonia 2010  . Headache, chronic daily   . History of migraine     last migraine a couple of days ago;takes Excedrin Migraine  . Chronic back pain     compression fracture  . History of colon polyps   . Mood change     after Brain surgery mood changed and was placed on Depakote  . Insomnia     takes Benadryl nightly  . Stroke 1998    Brain Aneurysm    Medications:  Prescriptions prior to admission  Medication Sig Dispense Refill  . acetaminophen (TYLENOL) 325 MG tablet Take 650 mg by mouth every 6 (six) hours as needed for mild pain.      Marland Kitchen albuterol (PROVENTIL HFA;VENTOLIN  HFA) 108 (90 BASE) MCG/ACT inhaler Inhale 2 puffs into the lungs every 4 (four) hours as needed for wheezing or shortness of breath.      . ARIPiprazole (ABILIFY) 5 MG tablet Take 5 mg by mouth every morning.      . Ascorbic Acid (VITAMIN C) 1000 MG tablet Take 1,000 mg by mouth daily.      Marland Kitchen aspirin-acetaminophen-caffeine (EXCEDRIN MIGRAINE) 250-250-65 MG per tablet Take 2 tablets by mouth every 6 (six) hours as needed. For headaches      . budesonide-formoterol (SYMBICORT) 160-4.5 MCG/ACT inhaler Inhale 2 puffs into the lungs 2 (two) times daily.      . cholecalciferol (VITAMIN D) 1000 UNITS tablet Take 1,000 Units by mouth every morning.       . cyclobenzaprine (FLEXERIL) 10 MG tablet Take 10 mg by mouth daily as needed for muscle spasms.       . diazepam (VALIUM) 5 MG tablet Take 5 mg by mouth 3 (three) times daily.  scheduled      . divalproex (DEPAKOTE) 500 MG 24 hr tablet Take 1,000 mg by mouth at bedtime.       . docusate sodium (COLACE) 100 MG capsule Take 200 mg by mouth at bedtime.       . DULoxetine (CYMBALTA) 60 MG capsule Take 60 mg by mouth daily.      . fluticasone (FLONASE) 50 MCG/ACT nasal spray Place 2 sprays into both nostrils daily.      Marland Kitchen HYDROcodone-acetaminophen (NORCO/VICODIN) 5-325 MG per tablet Take 1 tablet by mouth every 4 (four) hours as needed for moderate pain.       Marland Kitchen levofloxacin (LEVAQUIN) 750 MG tablet Take 750 mg by mouth daily.      . metoprolol succinate (TOPROL-XL) 50 MG 24 hr tablet Take 50 mg by mouth every morning. Take with or immediately following a meal.      . montelukast (SINGULAIR) 10 MG tablet Take 10 mg by mouth at bedtime.      . nitroGLYCERIN (NITROSTAT) 0.4 MG SL tablet Place 0.4 mg under the tongue every 5 (five) minutes as needed for chest pain.      . Omega-3 Fatty Acids (FISH OIL) 1000 MG CAPS Take 1 capsule by mouth every morning.       Marland Kitchen omeprazole (PRILOSEC) 40 MG capsule Take 40 mg by mouth 2 (two) times daily.       . polyethylene glycol (MIRALAX / GLYCOLAX) packet Take 17 g by mouth 2 (two) times daily. For constipation      . primidone (MYSOLINE) 250 MG tablet Take 250 mg by mouth 2 (two) times daily.      . Simethicone (MYLANTA GAS PO) Take 30 mLs by mouth daily as needed (for indigestion).       . simvastatin (ZOCOR) 40 MG tablet Take 40 mg by mouth every evening.      . tiotropium (SPIRIVA) 18 MCG inhalation capsule Place 18 mcg into inhaler and inhale daily.      . Zinc 50 MG TABS Take 50 mg by mouth daily.        Assessment: 56 YOM with to start Vancomycin and Cefepime for HCAP. The patient failed outpatient levaquin. WBC wnl. Pt is afebrile. CrCl ~ 89 mL/min   9/13 Urine Cx >>   Goal of Therapy:  Vancomycin trough level 15-20 mcg/ml  Plan:  -Start patient on Vancomycin 750 mg IV Q 8 hours  -Cefepime 1 gm IV Q 12 hours per MD.   -  Monitor CBC, renal fx, cultures and patient's clinical progress  -VT at [facility], PharmD.  Clinical Pharmacist Pager [phone]

## 2013-10-23 NOTE — ED Provider Notes (Signed)
CSN: [id number]     Arrival date & time 10/23/13  0946 History   First MD Initiated Contact with Patient 10/23/13 1025     Chief Complaint  Patient presents with  . Pneumonia      HPI Pt was seen at 1035. Per pt and his wife, c/o gradual onset and worsening of persistent generalized weakness, generalized body aches and fatigue for the past 1 week. Has been associated with decreased PO intake, cough and sore throat. States he was dx with "pneumonia" last week, rx levaquin. Endorses he is still taking his abx. Denies N/V/D, no abd pain, no CP/palpitations, no SOB, no back pain, no fevers, no rash.    Past Medical History  Diagnosis Date  . Hyperlipidemia     takes Simvastatin daily  . Tremor   . History of prostate cancer   . On home O2   . Neuromuscular disorder 1998    right carpal tunnel release  . Anemia associated with acute blood loss   . Cancer 2004    prostate  . GERD (gastroesophageal reflux disease)     takes Omeprazole daily  . Hypertension     takes Metoprolol daily as well as Hyzaar  . Constipation     takes Colace daily as well as Miralax  . Anxiety     takes Valium daily  . Depression     takes Cymbalta daily  . Emphysema of lung     Albuterol as needed;Symbicort daily and Singulair at bedtime  . Emphysema   . Myocardial infarction 04/1998  . Coronary artery disease   . Asthma   . Shortness of breath     with exertion  . History of bronchitis   . Aspiration pneumonia 2010  . Headache, chronic daily   . History of migraine     last migraine a couple of days ago;takes Excedrin Migraine  . Chronic back pain     compression fracture  . History of colon polyps   . Mood change     after Brain surgery mood changed and was placed on Depakote  . Insomnia     takes Benadryl nightly  . 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 Cholecystectomy  1996  . 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 Annette Stable  . Vascular  stent  2000    Dr. Rollene Fare; he reports cardiac stent, but denies stent for PAD 06/15/13  . Hand surgery  1989    crushed thumb, Dr. Fredna Dow  . Ulnar nerve repair  1998    left arm, Dr. Fredna Dow  . Gastrostomy w/ feeding tube  2008    Dr. Watt Climes; only had for a few months  . Prostatectomy  04/2001    removal of prostate cancer, Dr. Janice Norrie  . Coronary angioplasty with stent placement  04/1998  . Carpal tunnel release  1998    right  . Spine surgery    . 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 Back surgery  08/2004; 02/2005; 04/2006; 06/2007; 7/20101    all by Dr. Annette Stable  . Esophagogastroduodenoscopy N/A 07/23/2012    Procedure: ESOPHAGOGASTRODUODENOSCOPY (EGD);  Surgeon: Jeryl Columbia, MD;  Location: [facility];  Service: Endoscopy;  Laterality: N/A;  buccini /ja  . Colonoscopy    . Back surgery  05/2013   Family History  Problem Relation Age of Onset  . Heart disease Mother   .  Cancer Father     brain cancer and prostate cancer    History  Substance Use Topics  . Smoking status: Former Smoker -- 0.50 packs/day for 50 years    Types: Cigarettes    Quit date: 02/10/2013  . Smokeless tobacco: Never Used     Comment: quit smoking 4-59months ago  . Alcohol Use: No    Review of Systems ROS: Statement: All systems negative except as marked or noted in the HPI; Constitutional: Negative for fever and +chills, generalized weakness/fatigue, generalized body aches.. ; ; Eyes: Negative for eye pain, redness and discharge. ; ; ENMT: Negative for ear pain, hoarseness, nasal congestion, sinus pressure and +sore throat. ; ; Cardiovascular: Negative for chest pain, palpitations, diaphoresis, dyspnea and peripheral edema. ; ; Respiratory: +cough. Negative for wheezing and stridor. ; ; Gastrointestinal: Negative for nausea, vomiting, diarrhea, abdominal pain, blood in stool, hematemesis, jaundice and rectal bleeding. . ; ; Genitourinary: Negative for dysuria, flank pain and hematuria. ; ; Musculoskeletal: Negative  for back pain and neck pain. Negative for swelling and trauma.; ; Skin: Negative for pruritus, rash, abrasions, blisters, bruising and skin lesion.; ; Neuro: Negative for headache, lightheadedness and neck stiffness. Negative for altered level of consciousness , altered mental status, extremity weakness, paresthesias, involuntary movement, seizure and syncope.      Allergies  Lisinopril; Ambien; and Lamictal  Home Medications   Prior to Admission medications   Medication Sig Start Date End Date Taking? Authorizing Provider  acetaminophen (TYLENOL) 325 MG tablet Take 2 tablets (650 mg total) by mouth every 6 (six) hours as needed for mild pain (or Fever >/= 101). 10/17/13   Kinnie Feil, MD  albuterol (PROAIR HFA) 108 (90 BASE) MCG/ACT inhaler Inhale 2 puffs into the lungs every 4 (four) hours as needed for wheezing. 11/04/12   [name], MD  ARIPiprazole (ABILIFY) 5 MG tablet Take 5 mg by mouth every morning.    Historical Provider, MD  Ascorbic Acid (VITAMIN C) 1000 MG tablet Take 1,000 mg by mouth daily.    Historical Provider, MD  aspirin-acetaminophen-caffeine (EXCEDRIN MIGRAINE) [phone] MG per tablet Take 2 tablets by mouth every 6 (six) hours as needed. For headaches    Historical Provider, MD  budesonide-formoterol (SYMBICORT) 160-4.5 MCG/ACT inhaler Inhale 2 puffs into the lungs 2 (two) times daily. 08/09/13   [name], MD  cholecalciferol (VITAMIN D) 1000 UNITS tablet Take 1,000 Units by mouth every morning.     Historical Provider, MD  cyclobenzaprine (FLEXERIL) 10 MG tablet Take 10 mg by mouth daily as needed for muscle spasms.     Historical Provider, MD  diazepam (VALIUM) 5 MG tablet Take 5 mg by mouth 3 (three) times daily. scheduled 06/22/13   Charlie Pitter, MD  divalproex (DEPAKOTE) 500 MG 24 hr tablet Take 1,000 mg by mouth at bedtime.     Historical Provider, MD  docusate sodium (COLACE) 100 MG capsule Take 200 mg by mouth at bedtime.     Historical  Provider, MD  DULoxetine (CYMBALTA) 60 MG capsule Take 60 mg by mouth daily.    Historical Provider, MD  fluticasone (FLONASE) 50 MCG/ACT nasal spray Place 2 sprays into both nostrils daily.    Historical Provider, MD  HYDROcodone-acetaminophen (NORCO/VICODIN) 5-325 MG per tablet Take 1 tablet by mouth every 4 (four) hours as needed for moderate pain.     Historical Provider, MD  levofloxacin (LEVAQUIN) 750 MG tablet Take 1 tablet (750 mg total) by mouth daily.  10/17/13   Kinnie Feil, MD  metoprolol succinate (TOPROL-XL) 50 MG 24 hr tablet Take 50 mg by mouth every morning. Take with or immediately following a meal.    Historical Provider, MD  montelukast (SINGULAIR) 10 MG tablet Take 10 mg by mouth at bedtime.    Historical Provider, MD  nitroGLYCERIN (NITROSTAT) 0.4 MG SL tablet Place 1 tablet (0.4 mg total) under the tongue every 5 (five) minutes as needed for chest pain. 07/27/13   Belva Crome III, MD  Omega-3 Fatty Acids (FISH OIL) 1000 MG CAPS Take 1 capsule by mouth every morning.     Historical Provider, MD  omeprazole (PRILOSEC) 40 MG capsule Take 40 mg by mouth 2 (two) times daily.     Historical Provider, MD  OVER THE COUNTER MEDICATION Take 1 tablet by mouth every 4 (four) hours as needed (for nausea. [employer] brand nausea relief).    Historical Provider, MD  OVER THE COUNTER MEDICATION Take 1 tablet by mouth at bedtime as needed (for sleep. [employer] brand of sleep aid).    Historical Provider, MD  polyethylene glycol (MIRALAX / GLYCOLAX) packet Take 17 g by mouth 2 (two) times daily. For constipation 07/24/12   Delfina Redwood, MD  primidone (MYSOLINE) 250 MG tablet Take 250 mg by mouth 2 (two) times daily.    Historical Provider, MD  Simethicone (MYLANTA GAS PO) Take 30 mLs by mouth daily as needed (for indigestion).     Historical Provider, MD  simvastatin (ZOCOR) 40 MG tablet Take 40 mg by mouth every evening.    Historical Provider, MD  tiotropium (SPIRIVA HANDIHALER) 18 MCG  inhalation capsule Place 1 capsule (18 mcg total) into inhaler and inhale daily. 08/23/13   [name], MD  Zinc 50 MG TABS Take 50 mg by mouth daily.     Historical Provider, MD   BP 125/80  Pulse 123  Temp(Src) 99.1 F (37.3 C) (Rectal)  Resp 15  SpO2 96%  11:20:30: Orthostatic Vital Signs Orthostatic Lying - BP- Lying: 122/78 mmHg ; Pulse- Lying: 125  Orthostatic Sitting - BP- Sitting: 125/81 mmHg ; Pulse- Sitting: 128  Orthostatic Standing at 0 minutes - BP- Standing at 0 minutes: 132/87 mmHg ; Pulse- Standing at 0 minutes: 146  Physical Exam 1040: Physical examination:  Nursing notes reviewed; Vital signs and O2 SAT reviewed;  Constitutional: Thin, frail, chronically ill appearing. In no acute distress; Head:  Normocephalic, atraumatic; Eyes: EOMI, PERRL, No scleral icterus; ENMT: Mouth and pharynx normal, Mucous membranes dry. Mouth and pharynx without lesions. No tonsillar exudates. No intra-oral edema. No submandibular or sublingual edema. No hoarse voice, no drooling, no stridor. No pain with manipulation of larynx. No trismus.;; Neck: Supple, Full range of motion, No lymphadenopathy; Cardiovascular: Tachycardic rate and rhythm, No gallop; Respiratory: Breath sounds diminished & equal bilaterally, No wheezes.  Speaking full sentences with ease, Normal respiratory effort/excursion; Chest: Nontender, Movement normal; Abdomen: Soft, Nontender, Nondistended, Normal bowel sounds; Genitourinary: No CVA tenderness; Extremities: Pulses normal, No tenderness, No edema, No calf edema or asymmetry.; Neuro: AA&Ox3, Major CN grossly intact.  Speech clear. No gross focal motor or sensory deficits in extremities.; Skin: Color normal, Warm, Dry.   ED Course  Procedures    MDM  MDM Reviewed: previous chart, nursing note and vitals Reviewed previous: labs and CT scan Interpretation: labs and x-ray    Results for orders placed during the hospital encounter of 10/23/13  RAPID STREP  SCREEN  Result Value Ref Range   Streptococcus, Group A Screen (Direct) NEGATIVE  NEGATIVE  BASIC METABOLIC PANEL      Result Value Ref Range   Sodium 132 (*) 137 - 147 mEq/L   Potassium 4.3  3.7 - 5.3 mEq/L   Chloride 95 (*) 96 - 112 mEq/L   CO2 24  19 - 32 mEq/L   Glucose, Bld 77  70 - 99 mg/dL   BUN 14  6 - 23 mg/dL   Creatinine, Ser 0.65  0.50 - 1.35 mg/dL   Calcium 9.0  8.4 - 10.5 mg/dL   GFR calc non Af Amer >90  >90 mL/min   GFR calc Af Amer >90  >90 mL/min   Anion gap 13  5 - 15  CBC WITH DIFFERENTIAL      Result Value Ref Range   WBC 10.2  4.0 - 10.5 K/uL   RBC 3.91 (*) 4.22 - 5.81 MIL/uL   Hemoglobin 10.3 (*) 13.0 - 17.0 g/dL   HCT 31.0 (*) 39.0 - 52.0 %   MCV 79.3  78.0 - 100.0 fL   MCH 26.3  26.0 - 34.0 pg   MCHC 33.2  30.0 - 36.0 g/dL   RDW 15.6 (*) 11.5 - 15.5 %   Platelets 295  150 - 400 K/uL   Neutrophils Relative % 85 (*) 43 - 77 %   Neutro Abs 8.7 (*) 1.7 - 7.7 K/uL   Lymphocytes Relative 8 (*) 12 - 46 %   Lymphs Abs 0.8  0.7 - 4.0 K/uL   Monocytes Relative 5  3 - 12 %   Monocytes Absolute 0.6  0.1 - 1.0 K/uL   Eosinophils Relative 2  0 - 5 %   Eosinophils Absolute 0.2  0.0 - 0.7 K/uL   Basophils Relative 0  0 - 1 %   Basophils Absolute 0.0  0.0 - 0.1 K/uL  URINALYSIS, ROUTINE W REFLEX MICROSCOPIC      Result Value Ref Range   Color, Urine YELLOW  YELLOW   APPearance CLEAR  CLEAR   Specific Gravity, Urine 1.021  1.005 - 1.030   pH 6.0  5.0 - 8.0   Glucose, UA NEGATIVE  NEGATIVE mg/dL   Hgb urine dipstick NEGATIVE  NEGATIVE   Bilirubin Urine NEGATIVE  NEGATIVE   Ketones, ur NEGATIVE  NEGATIVE mg/dL   Protein, ur NEGATIVE  NEGATIVE mg/dL   Urobilinogen, UA 0.2  0.0 - 1.0 mg/dL   Nitrite NEGATIVE  NEGATIVE   Leukocytes, UA NEGATIVE  NEGATIVE  LACTIC ACID, PLASMA      Result Value Ref Range   Lactic Acid, Venous 1.3  0.5 - 2.2 mmol/L   Dg Chest 2 View 10/23/2013   CLINICAL DATA:  Pneumonia  EXAM: CHEST  2 VIEW  COMPARISON:  10/16/2013   FINDINGS: Normal heart size. No pleural effusion or edema. There are coarsened interstitial markings identified bilaterally. Progressive increase and airspace consolidation within the left lung base is noted. Right lung remains clear. Postsurgical changes within the lower thoracic and lumbar spine noted.  IMPRESSION: Left lower lobe pneumonia.  Increased from previous exam.   Electronically Signed   By: Kerby Moors M.D.   On: 10/23/2013 11:50   Ct Angio Chest Pe W/cm &/or Wo Cm 10/16/2013   CLINICAL DATA:  Shortness of breath, hypoxia, dizziness and hypotension.  EXAM: CT ANGIOGRAPHY CHEST WITH CONTRAST  TECHNIQUE: Multidetector CT imaging of the chest was performed using the standard protocol during bolus administration of  intravenous contrast. Multiplanar CT image reconstructions and MIPs were obtained to evaluate the vascular anatomy.  CONTRAST:  48mL OMNIPAQUE IOHEXOL 350 MG/ML SOLN  COMPARISON:  CT of the thoracic spine on 05/20/2013 as well as multiple prior imaging studies of the spine.  FINDINGS: The pulmonary arteries are adequately opacified. There is no evidence of pulmonary embolism. Infiltrate of the right lower lobe has some areas of nodular component. Findings are most likely consistent with pneumonia. Scarring present at the left lung base. No edema, pleural fluid or pneumothorax. The heart size is normal. No pericardial fluid. Calcified plaque is seen in the distribution of the LAD.  No masses or enlarged lymph nodes are identified. The spine again demonstrates extensive prior thoracolumbar fusion. Previously noted posterior fusion hardware at T8 and T9 has been removed since the prior CT. There is stable compression of the T8 vertebral body.  Review of the MIP images confirms the above findings.  IMPRESSION: 1. No evidence of pulmonary embolism. 2. Right lower lobe infiltrate consistent with pneumonia. 3. Coronary atherosclerosis with calcified plaque in the LAD.   Electronically Signed   By:  Aletta Edouard M.D.   On: 10/16/2013 15:32     1235:  Pt remains tachycardic; will dose judicious IVF. Not febrile or orthostatic. Will start abx for HCAP. Dx and testing d/w pt and family.  Questions answered.  Verb understanding, agreeable to admit. T/C to [facility] Dr. Coralyn Pear, case discussed, including:  HPI, pertinent PM/SHx, VS/PE, dx testing, ED course and treatment:  Agreeable to admit, requests to write temporary orders, obtain tele bed to team 10.   Francine Graven, DO 10/26/13 [phone]

## 2013-10-23 NOTE — H&P (Signed)
[facility] History and Physical  Malik Zuniga CBJ:[id number] DOB: 11/23/1945 DOA: 10/23/2013  Referring physician:  PCP: [name], MD   Chief Complaint: Cough/shortness of breath  HPI: Malik Zuniga is a 68 y.o. male with a past medical history of chronic obstructive pulmonary disease, coronary disease, chronic back pain, who was recently admitted to the medicine service on 10/16/2013, discharged on 10/17/2013 at which time he was treated for a community acquire pneumonia.  He was discharged in stable condition with Levaquin 750 mg by mouth daily. He reports doing fairly well at home until last Friday where he had increasing cough, associated with scant sputum production, shortness of breath, subjective fevers and chills, generalized weakness, malaise, fatigue, anorexia, overall feeling ill and doing poorly at home. He continued his Levaquin therapy during this time. He denies chest pain, abdominal pain, hematuria, dysuria, syncope. A chest x-ray performed in the emergency room showed left lower lobe pneumonia, increased from previous exam. He was given a dose of vancomycin and cefepime.                                                                                                                                                                                                                                  Review of Systems:  Constitutional:  No weight loss, night sweats, positive for fevers, chills, fatigue.  HEENT:  No headaches, Difficulty swallowing,Tooth/dental problems,Sore throat,  No sneezing, itching, ear ache, nasal congestion, post nasal drip,  Cardio-vascular:  No chest pain, Orthopnea, PND, swelling in lower extremities, anasarca, dizziness, palpitations  GI:  No heartburn, indigestion, abdominal pain, nausea, vomiting, diarrhea, change in bowel habits, loss of appetite  Resp:  Positive for shortness of breath with exertion or at rest. No excess mucus, no  productive cough, positive for non-productive cough, No coughing up of blood.No change in color of mucus.No wheezing.No chest wall deformity  Skin:  no rash or lesions.  GU:  no dysuria, change in color of urine, no urgency or frequency. No flank pain.  Musculoskeletal:  No joint pain or swelling. No decreased range of motion. No back pain.  Psych:  No change in mood or affect. No depression or anxiety. No memory loss.   Past Medical History  Diagnosis Date  . Hyperlipidemia     takes Simvastatin daily  . Tremor   . History of prostate cancer   . On home O2   .  Neuromuscular disorder 1998    right carpal tunnel release  . Anemia associated with acute blood loss   . Cancer 2004    prostate  . GERD (gastroesophageal reflux disease)     takes Omeprazole daily  . Hypertension     takes Metoprolol daily as well as Hyzaar  . Constipation     takes Colace daily as well as Miralax  . Anxiety     takes Valium daily  . Depression     takes Cymbalta daily  . Emphysema of lung     Albuterol as needed;Symbicort daily and Singulair at bedtime  . Emphysema   . Myocardial infarction 04/1998  . Coronary artery disease   . Asthma   . Shortness of breath     with exertion  . History of bronchitis   . Aspiration pneumonia 2010  . Headache, chronic daily   . History of migraine     last migraine a couple of days ago;takes Excedrin Migraine  . Chronic back pain     compression fracture  . History of colon polyps   . Mood change     after Brain surgery mood changed and was placed on Depakote  . Insomnia     takes Benadryl nightly  . 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 Cholecystectomy  1996  . 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 Annette Stable  . Vascular stent  2000    Dr. Rollene Fare; he reports cardiac stent, but denies stent for PAD 06/15/13  . Hand surgery  1989    crushed thumb, Dr. Fredna Dow  . Ulnar nerve repair  1998    left arm,  Dr. Fredna Dow  . Gastrostomy w/ feeding tube  2008    Dr. Watt Climes; only had for a few months  . Prostatectomy  04/2001    removal of prostate cancer, Dr. Janice Norrie  . Coronary angioplasty with stent placement  04/1998  . Carpal tunnel release  1998    right  . Spine surgery    . 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 Back surgery  08/2004; 02/2005; 04/2006; 06/2007; 7/20101    all by Dr. Annette Stable  . Esophagogastroduodenoscopy N/A 07/23/2012    Procedure: ESOPHAGOGASTRODUODENOSCOPY (EGD);  Surgeon: Jeryl Columbia, MD;  Location: [facility] ENDOSCOPY;  Service: Endoscopy;  Laterality: N/A;  buccini /ja  . Colonoscopy    . Back surgery  05/2013   Social History:  reports that he quit smoking about 8 months ago. His smoking use included Cigarettes. He has a 25 pack-year smoking history. He has never used smokeless tobacco. He reports that he does not drink alcohol or use illicit drugs.  Allergies  Allergen Reactions  . Lisinopril Swelling    ANGIOEDEMA  . Ambien [Zolpidem] Other (See Comments)    Unknown reaction  . Lamictal [Lamotrigine] Other (See Comments)    Weakness/difficulty swallowing    Family History  Problem Relation Age of Onset  . Heart disease Mother   . Cancer Father     brain cancer and prostate cancer      Prior to Admission medications   Medication Sig Start Date End Date Taking? Authorizing Provider  acetaminophen (TYLENOL) 325 MG tablet Take 650 mg by mouth every 6 (six) hours as needed for mild pain.   Yes Historical Provider, MD  albuterol (PROVENTIL HFA;VENTOLIN HFA) 108 (90 BASE) MCG/ACT inhaler Inhale 2 puffs into  the lungs every 4 (four) hours as needed for wheezing or shortness of breath.   Yes Historical Provider, MD  ARIPiprazole (ABILIFY) 5 MG tablet Take 5 mg by mouth every morning.   Yes Historical Provider, MD  Ascorbic Acid (VITAMIN C) 1000 MG tablet Take 1,000 mg by mouth daily.   Yes Historical Provider, MD  aspirin-acetaminophen-caffeine (EXCEDRIN MIGRAINE) [phone] MG  per tablet Take 2 tablets by mouth every 6 (six) hours as needed. For headaches   Yes Historical Provider, MD  budesonide-formoterol (SYMBICORT) 160-4.5 MCG/ACT inhaler Inhale 2 puffs into the lungs 2 (two) times daily.   Yes Historical Provider, MD  cholecalciferol (VITAMIN D) 1000 UNITS tablet Take 1,000 Units by mouth every morning.    Yes Historical Provider, MD  cyclobenzaprine (FLEXERIL) 10 MG tablet Take 10 mg by mouth daily as needed for muscle spasms.    Yes Historical Provider, MD  diazepam (VALIUM) 5 MG tablet Take 5 mg by mouth 3 (three) times daily. scheduled 06/22/13  Yes Charlie Pitter, MD  divalproex (DEPAKOTE) 500 MG 24 hr tablet Take 1,000 mg by mouth at bedtime.    Yes Historical Provider, MD  docusate sodium (COLACE) 100 MG capsule Take 200 mg by mouth at bedtime.    Yes Historical Provider, MD  DULoxetine (CYMBALTA) 60 MG capsule Take 60 mg by mouth daily.   Yes Historical Provider, MD  fluticasone (FLONASE) 50 MCG/ACT nasal spray Place 2 sprays into both nostrils daily.   Yes Historical Provider, MD  HYDROcodone-acetaminophen (NORCO/VICODIN) 5-325 MG per tablet Take 1 tablet by mouth every 4 (four) hours as needed for moderate pain.    Yes Historical Provider, MD  levofloxacin (LEVAQUIN) 750 MG tablet Take 750 mg by mouth daily.   Yes Historical Provider, MD  metoprolol succinate (TOPROL-XL) 50 MG 24 hr tablet Take 50 mg by mouth every morning. Take with or immediately following a meal.   Yes Historical Provider, MD  montelukast (SINGULAIR) 10 MG tablet Take 10 mg by mouth at bedtime.   Yes Historical Provider, MD  nitroGLYCERIN (NITROSTAT) 0.4 MG SL tablet Place 0.4 mg under the tongue every 5 (five) minutes as needed for chest pain.   Yes Historical Provider, MD  Omega-3 Fatty Acids (FISH OIL) 1000 MG CAPS Take 1 capsule by mouth every morning.    Yes Historical Provider, MD  omeprazole (PRILOSEC) 40 MG capsule Take 40 mg by mouth 2 (two) times daily.    Yes Historical Provider,  MD  polyethylene glycol (MIRALAX / GLYCOLAX) packet Take 17 g by mouth 2 (two) times daily. For constipation 07/24/12  Yes Delfina Redwood, MD  primidone (MYSOLINE) 250 MG tablet Take 250 mg by mouth 2 (two) times daily.   Yes Historical Provider, MD  Simethicone (MYLANTA GAS PO) Take 30 mLs by mouth daily as needed (for indigestion).    Yes Historical Provider, MD  simvastatin (ZOCOR) 40 MG tablet Take 40 mg by mouth every evening.   Yes Historical Provider, MD  tiotropium (SPIRIVA) 18 MCG inhalation capsule Place 18 mcg into inhaler and inhale daily.   Yes Historical Provider, MD  Zinc 50 MG TABS Take 50 mg by mouth daily.    Yes Historical Provider, MD   Physical Exam: Filed Vitals:   10/23/13 1131 10/23/13 1200 10/23/13 1230 10/23/13 1300  BP:  126/74 128/75   Pulse:  125 125   Temp: 99.1 F (37.3 C)     TempSrc: Rectal     Resp:  19 24  Height:    5' 10.08" (1.78 m)  Weight:    69.1 kg (152 lb 5.4 oz)  SpO2:  97% 96%     Wt Readings from Last 3 Encounters:  10/23/13 69.1 kg (152 lb 5.4 oz)  10/16/13 69.083 kg (152 lb 4.8 oz)  10/12/13 69.763 kg (153 lb 12.8 oz)    General:  Ill appearing, although no acute distress, he is awake and alert Eyes: PERRL, normal lids, irises & conjunctiva ENT: grossly normal hearing, lips & tongue Neck: no LAD, masses or thyromegaly Cardiovascular: RRR, no m/r/g. No LE edema. Tachycardic Telemetry: SR, no arrhythmias  Respiratory: Patient having some nonexertional dyspnea, bilateral rhonchi, a few expiratory wheezes, bibasilar crackles Abdomen: soft, ntnd Skin: no rash or induration seen on limited exam Musculoskeletal: grossly normal tone BUE/BLE Psychiatric: grossly normal mood and affect, speech fluent and appropriate Neurologic: grossly non-focal.          Labs on Admission:  Basic Metabolic Panel:  Recent Labs Lab 10/17/13 0340 10/23/13 1050  NA 132* 132*  K 4.4 4.3  CL 97 95*  CO2 23 24  GLUCOSE 91 77  BUN 10 14    CREATININE 0.54 0.65  CALCIUM 9.0 9.0   Liver Function Tests: No results found for this basename: AST, ALT, ALKPHOS, BILITOT, PROT, ALBUMIN,  in the last 168 hours No results found for this basename: LIPASE, AMYLASE,  in the last 168 hours No results found for this basename: AMMONIA,  in the last 168 hours CBC:  Recent Labs Lab 10/23/13 1050  WBC 10.2  NEUTROABS 8.7*  HGB 10.3*  HCT 31.0*  MCV 79.3  PLT 295   Cardiac Enzymes: No results found for this basename: CKTOTAL, CKMB, CKMBINDEX, TROPONINI,  in the last 168 hours  BNP (last 3 results) No results found for this basename: PROBNP,  in the last 8760 hours CBG: No results found for this basename: GLUCAP,  in the last 168 hours  Radiological Exams on Admission: Dg Chest 2 View  10/23/2013   CLINICAL DATA:  Pneumonia  EXAM: CHEST  2 VIEW  COMPARISON:  10/16/2013  FINDINGS: Normal heart size. No pleural effusion or edema. There are coarsened interstitial markings identified bilaterally. Progressive increase and airspace consolidation within the left lung base is noted. Right lung remains clear. Postsurgical changes within the lower thoracic and lumbar spine noted.  IMPRESSION: Left lower lobe pneumonia.  Increased from previous exam.   Electronically Signed   By: Kerby Moors M.D.   On: 10/23/2013 11:50    EKG: Independently reviewed.   Assessment/Plan Principal Problem:   HCAP (healthcare-associated pneumonia) Active Problems:   COPD (chronic obstructive pulmonary disease) with emphysema   CAD (coronary artery disease)   Hyponatremia   Chronic lower back pain   1. Healthcare associated pneumonia. Patient having a recent hospitalization where time he was treated for community-acquired pneumonia discharged on Levaquin 750 mg by mouth daily, becoming ill last Friday with increasing cough, subjective fevers, chills, malaise. Chest x-ray performed in the emergency department showing left lower lobe pneumonia increased from  previous exam. Will treat for healthcare associated pneumonia, start IV cefepime and vancomycin per pharmacy. Followup on blood cultures and sputum cultures, provide supportive care. Will repeat chest x-ray in a.m. 2. Sinus tachycardia. In the emergency room patient tachycardic with heart rates in the 120s. Could be related to underlying pneumonia, COPD, anxiety. Starting IV antibiotics, providing Xopenex Nebs as needed for SOB/whezing, place patient on telemetry. Continue as needed diazepam  for anxiety. 3. Hypertension. Will continue metoprolol 50 mg by mouth daily 4. Chronic obstructive pulmonary disease. Will provide Xopenex nebs as needed for shortness of breath and wheezing. Is currently satting mid 90s on room air, will hold off on steroids for now and monitor. 5. Hyponatremia. Stable, labs showing a sodium of 132. 6. DVT prophylaxis. Lovenox   Code Status: Full code Family Communication: Spoke with his wife present at bedside Disposition Plan: I anticipate he'll require greater than 2 night hospitalization, admit to inpatient service  Time spent: 70 min  Kelvin Cellar [facility] Pager [phone]  **Disclaimer: This note may have been dictated with voice recognition software. Similar sounding words can inadvertently be transcribed and this note may contain transcription errors which may not have been corrected upon publication of note.**

## 2013-10-23 NOTE — ED Notes (Signed)
Pt. Stated, I was dx. With pneumonia last week and I opted to go home and take medicine, but it was the wrong decision.  Im no better Im still a lot congested.  I m weak all over, no energy at all sore all ovr and I have a sore throat , muscle aches and pain.  I have congestion but nothing comes up.

## 2013-10-24 ENCOUNTER — Observation Stay (HOSPITAL_COMMUNITY): Payer: Medicare Other

## 2013-10-24 DIAGNOSIS — J189 Pneumonia, unspecified organism: Secondary | ICD-10-CM | POA: Diagnosis not present

## 2013-10-24 DIAGNOSIS — G8929 Other chronic pain: Secondary | ICD-10-CM

## 2013-10-24 DIAGNOSIS — M545 Low back pain, unspecified: Secondary | ICD-10-CM

## 2013-10-24 LAB — CBC
HCT: 28.9 % — ABNORMAL LOW (ref 39.0–52.0)
Hemoglobin: 9.5 g/dL — ABNORMAL LOW (ref 13.0–17.0)
MCH: 25.9 pg — ABNORMAL LOW (ref 26.0–34.0)
MCHC: 32.9 g/dL (ref 30.0–36.0)
MCV: 78.7 fL (ref 78.0–100.0)
PLATELETS: 267 10*3/uL (ref 150–400)
RBC: 3.67 MIL/uL — ABNORMAL LOW (ref 4.22–5.81)
RDW: 15.6 % — AB (ref 11.5–15.5)
WBC: 12.8 10*3/uL — AB (ref 4.0–10.5)

## 2013-10-24 LAB — EXPECTORATED SPUTUM ASSESSMENT W REFEX TO RESP CULTURE: SPECIAL REQUESTS: NORMAL

## 2013-10-24 LAB — BASIC METABOLIC PANEL
Anion gap: 14 (ref 5–15)
BUN: 7 mg/dL (ref 6–23)
CALCIUM: 8.6 mg/dL (ref 8.4–10.5)
CO2: 22 mEq/L (ref 19–32)
Chloride: 98 mEq/L (ref 96–112)
Creatinine, Ser: 0.65 mg/dL (ref 0.50–1.35)
GFR calc Af Amer: >90 mL/min (ref >90)
Glucose, Bld: 105 mg/dL — ABNORMAL HIGH (ref 70–99)
Potassium: 4.5 mEq/L (ref 3.7–5.3)
SODIUM: 134 meq/L — AB (ref 137–147)

## 2013-10-24 LAB — EXPECTORATED SPUTUM ASSESSMENT W GRAM STAIN, RFLX TO RESP C

## 2013-10-24 MED ORDER — LEVALBUTEROL HCL 0.63 MG/3ML IN NEBU
0.6300 mg | INHALATION_SOLUTION | Freq: Four times a day (QID) | RESPIRATORY_TRACT | Status: DC
  Administered 2013-10-24 – 2013-10-25 (×5): 0.63 mg via RESPIRATORY_TRACT
  Filled 2013-10-24 (×10): qty 3

## 2013-10-24 MED ORDER — HYDROCODONE-ACETAMINOPHEN 5-325 MG PO TABS
1.0000 | ORAL_TABLET | Freq: Four times a day (QID) | ORAL | Status: DC | PRN
  Administered 2013-10-24 – 2013-10-25 (×3): 1 via ORAL
  Filled 2013-10-24 (×4): qty 1

## 2013-10-24 MED ORDER — ENSURE COMPLETE PO LIQD
237.0000 mL | Freq: Two times a day (BID) | ORAL | Status: DC
  Administered 2013-10-24 – 2013-10-27 (×6): 237 mL via ORAL
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
## 2013-10-24 NOTE — Progress Notes (Signed)
Utilization review completed.  

## 2013-10-24 NOTE — Progress Notes (Signed)
INITIAL NUTRITION ASSESSMENT  DOCUMENTATION CODES Per approved criteria  -Not Applicable   INTERVENTION: Provide Ensure Complete BID in between meals, each supplement provides 350 kcal and 13 grams of protein Encourage PO intake as tolerated RD to continue to monitor   NUTRITION DIAGNOSIS: Inadequate oral intake related to poor appetite as evidenced by pt's report and 50% meal completion.   Goal: Pt to meet >/= 90% of their estimated nutrition needs   Monitor:  PO intake, weight trend, labs  Reason for Assessment: Malnutrition Screening Tool, score of 2  68 y.o. male  Admitting Dx: HCAP (healthcare-associated pneumonia)  ASSESSMENT: 68 y.o. male with a past medical history of chronic obstructive pulmonary disease, coronary disease, chronic back pain, who was recently admitted to the medicine service on 10/16/2013, discharged on 10/17/2013 at which time he was treated for a community acquire pneumonia.  He reports doing fairly well at home until last Friday where he had increasing cough, associated with scant sputum production, shortness of breath, subjective fevers and chills, generalized weakness, malaise, fatigue, anorexia, overall feeling ill and doing poorly at home. A chest x-ray performed in the emergency room showed left lower lobe pneumonia, increased from previous exam.  Per nursing notes pt ate 50% of breakfast this morning and dinner last night. Pt reports that he has had a poor appetite for the past few weeks due to nausea and pain. He reports eating about 50% less than usual during this time. He is unsure if he has lost weight; he reports that he usually maintains his weight at 153 lbs. Pt very lethargic at time of visit; he reports feeling very weak for the past couple weeks. He is agreeable to trying Ensure supplements.   Nutrition Focused Physical Exam:  Subcutaneous Fat:  Orbital Region: wnl Upper Arm Region: mild wasting Thoracic and Lumbar Region: NA  Muscle:   Temple Region: wnl Clavicle Bone Region: mild wasting Clavicle and Acromion Bone Region: wnl Scapular Bone Region: NA Dorsal Hand: mild wasting Patellar Region: wnl Anterior Thigh Region: mild wasting Posterior Calf Region: wnl  Edema: none noted  Height: Ht Readings from Last 1 Encounters:  10/23/13 5\' 10"  (1.778 m)    Weight: Wt Readings from Last 1 Encounters:  10/24/13 156 lb 2.1 oz (70.82 kg)    Ideal Body Weight: 166 lbs  % Ideal Body Weight: 94%  Wt Readings from Last 10 Encounters:  10/24/13 156 lb 2.1 oz (70.82 kg)  10/16/13 152 lb 4.8 oz (69.083 kg)  10/12/13 153 lb 12.8 oz (69.763 kg)  09/05/13 156 lb (70.761 kg)  07/27/13 159 lb (72.122 kg)  06/21/13 159 lb (72.122 kg)  06/21/13 159 lb (72.122 kg)  06/16/13 155 lb 4.8 oz (70.444 kg)  04/11/13 159 lb 3.2 oz (72.213 kg)  11/04/12 148 lb 9.6 oz (67.405 kg)    Usual Body Weight: 153 lb  % Usual Body Weight: 102%  BMI:  Body mass index is 22.4 kg/(m^2).  Estimated Nutritional Needs: Kcal: 1700-2000 Protein: >/= 105 grams Fluid: 1.8-2 L/day  Skin: intact  Diet Order: General  EDUCATION NEEDS: -No education needs identified at this time   Intake/Output Summary (Last 24 hours) at 10/24/13 1059 Last data filed at 10/24/13 0900  Gross per 24 hour  Intake    600 ml  Output   1300 ml  Net   -700 ml    Last BM: PTA  Labs:   Recent Labs Lab 10/23/13 1050 10/24/13 0457  NA 132* 134*  K  4.3 4.5  CL 95* 98  CO2 24 22  BUN 14 7  CREATININE 0.65 0.65  CALCIUM 9.0 8.6  GLUCOSE 77 105*    CBG (last 3)  No results found for this basename: GLUCAP,  in the last 72 hours  Scheduled Meds: . ARIPiprazole  5 mg Oral q morning - 10a  . budesonide-formoterol  2 puff Inhalation BID  . ceFEPime (MAXIPIME) IV  1 g Intravenous Q12H  . divalproex  1,000 mg Oral QHS  . docusate sodium  200 mg Oral QHS  . DULoxetine  60 mg Oral Daily  . enoxaparin (LOVENOX) injection  40 mg Subcutaneous Q24H  .  levalbuterol  0.63 mg Nebulization Q6H  . metoprolol succinate  50 mg Oral q morning - 10a  . montelukast  10 mg Oral QHS  . pantoprazole  40 mg Oral Daily  . polyethylene glycol  17 g Oral BID  . primidone  250 mg Oral BID  . simvastatin  40 mg Oral QPM  . sodium chloride  3 mL Intravenous Q12H  . tiotropium  18 mcg Inhalation Daily  . vancomycin  750 mg Intravenous Q8H    Continuous Infusions: . sodium chloride 75 mL/hr at 10/24/13 0346    Past Medical History  Diagnosis Date  . Hyperlipidemia     takes Simvastatin daily  . Tremor   . History of prostate cancer   . On home O2   . Neuromuscular disorder 1998    right carpal tunnel release  . Anemia associated with acute blood loss   . Cancer 2004    prostate  . GERD (gastroesophageal reflux disease)     takes Omeprazole daily  . Hypertension     takes Metoprolol daily as well as Hyzaar  . Constipation     takes Colace daily as well as Miralax  . Anxiety     takes Valium daily  . Depression     takes Cymbalta daily  . Emphysema of lung     Albuterol as needed;Symbicort daily and Singulair at bedtime  . Emphysema   . Myocardial infarction 04/1998  . Coronary artery disease   . Asthma   . Shortness of breath     with exertion  . History of bronchitis   . Aspiration pneumonia 2010  . Headache, chronic daily   . History of migraine     last migraine a couple of days ago;takes Excedrin Migraine  . Chronic back pain     compression fracture  . History of colon polyps   . Mood change     after Brain surgery mood changed and was placed on Depakote  . Insomnia     takes Benadryl nightly  . 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 Cholecystectomy  1996  . 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 Annette Stable  . Vascular stent  2000    Dr. Rollene Fare; he reports cardiac stent, but denies stent for PAD 06/15/13  . Hand surgery  1989    crushed thumb, Dr. Fredna Dow  . Ulnar nerve  repair  1998    left arm, Dr. Fredna Dow  . Gastrostomy w/ feeding tube  2008    Dr. Watt Climes; only had for a few months  . Prostatectomy  04/2001    removal of prostate cancer, Dr. Janice Norrie  . Coronary angioplasty with stent placement  04/1998  . Carpal tunnel release  1998    right  . Spine surgery    . 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 Back surgery  08/2004; 02/2005; 04/2006; 06/2007; 7/20101    all by Dr. Annette Stable  . Esophagogastroduodenoscopy N/A 07/23/2012    Procedure: ESOPHAGOGASTRODUODENOSCOPY (EGD);  Surgeon: Jeryl Columbia, MD;  Location[facility]COPY;  Service: Endoscopy;  Laterality: N/A;  buccini /ja  . Colonoscopy    . Back surgery  05/2013    Pryor Ochoa RD, LDN Inpatient Clinical Dietitian Pager: [phone] After Hours Pager: [phone]
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
## 2013-10-24 NOTE — Progress Notes (Signed)
RT Note: Flutter initated and education given.  Good patient effort X 10.

## 2013-10-24 NOTE — Progress Notes (Signed)
Condom catheter would not stay on patient and urine was soaking the patient's bed and body. Catheter was removed and bed changed and will continue to monitor patient and assist with urinal.

## 2013-10-25 DIAGNOSIS — M4120 Other idiopathic scoliosis, site unspecified: Secondary | ICD-10-CM

## 2013-10-25 LAB — LEGIONELLA ANTIGEN, URINE: Legionella Antigen, Urine: NEGATIVE

## 2013-10-25 LAB — CULTURE, GROUP A STREP

## 2013-10-25 MED ORDER — GUAIFENESIN 100 MG/5ML PO SYRP
200.0000 mg | ORAL_SOLUTION | Freq: Four times a day (QID) | ORAL | Status: DC | PRN
  Administered 2013-10-25: 200 mg via ORAL
  Filled 2013-10-25: qty 10

## 2013-10-25 MED ORDER — HYDROCODONE-ACETAMINOPHEN 5-325 MG PO TABS
1.0000 | ORAL_TABLET | Freq: Four times a day (QID) | ORAL | Status: DC | PRN
  Administered 2013-10-25 – 2013-10-27 (×5): 2 via ORAL
  Filled 2013-10-25 (×5): qty 2

## 2013-10-25 MED ORDER — LORAZEPAM 1 MG PO TABS
1.0000 mg | ORAL_TABLET | Freq: Once | ORAL | Status: AC
  Administered 2013-10-25: 1 mg via ORAL
  Filled 2013-10-25: qty 1

## 2013-10-25 NOTE — Progress Notes (Signed)
Patient evaluated for community based chronic disease management services with [facility] as a benefit of patient's [employer]. Spoke with patient's wife/primary caregiver at bedside to explain [facility] services. Patient will not receive services at this time as his wife feels they can self manage. Left contact information and [facility] literature at bedside. Made Inpatient Case Manager aware that [facility] following. Of note, [facility] Management services does not replace or interfere with any services that are arranged by inpatient case management or social work. For additional questions or referrals please contact Corliss Blacker BSN RN [facility] Liaison at [phone].
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
## 2013-10-26 ENCOUNTER — Inpatient Hospital Stay (HOSPITAL_COMMUNITY): Payer: Medicare Other

## 2013-10-26 DIAGNOSIS — Z8546 Personal history of malignant neoplasm of prostate: Secondary | ICD-10-CM

## 2013-10-26 LAB — BASIC METABOLIC PANEL
ANION GAP: 12 (ref 5–15)
Anion gap: 12 (ref 5–15)
BUN: 7 mg/dL (ref 6–23)
BUN: 5 mg/dL — ABNORMAL LOW (ref 6–23)
CHLORIDE: 89 meq/L — AB (ref 96–112)
CHLORIDE: 89 meq/L — AB (ref 96–112)
CO2: 25 mEq/L (ref 19–32)
CO2: 25 mEq/L (ref 19–32)
CREATININE: 0.52 mg/dL (ref 0.50–1.35)
CREATININE: 0.44 mg/dL — AB (ref 0.50–1.35)
Calcium: 8.7 mg/dL (ref 8.4–10.5)
Calcium: 8.8 mg/dL (ref 8.4–10.5)
GFR calc non Af Amer: >90 mL/min (ref >90)
Glucose, Bld: 97 mg/dL (ref 70–99)
Glucose, Bld: 101 mg/dL — ABNORMAL HIGH (ref 70–99)
POTASSIUM: 3.3 meq/L — AB (ref 3.7–5.3)
Potassium: 3.9 mEq/L (ref 3.7–5.3)
Sodium: 126 mEq/L — ABNORMAL LOW (ref 137–147)
Sodium: 126 mEq/L — ABNORMAL LOW (ref 137–147)

## 2013-10-26 LAB — CBC
HCT: 25.7 % — ABNORMAL LOW (ref 39.0–52.0)
Hemoglobin: 8.6 g/dL — ABNORMAL LOW (ref 13.0–17.0)
MCH: 25.8 pg — ABNORMAL LOW (ref 26.0–34.0)
MCHC: 33.5 g/dL (ref 30.0–36.0)
MCV: 77.2 fL — ABNORMAL LOW (ref 78.0–100.0)
PLATELETS: 242 10*3/uL (ref 150–400)
RBC: 3.33 MIL/uL — AB (ref 4.22–5.81)
RDW: 15.1 % (ref 11.5–15.5)
WBC: 8.9 10*3/uL (ref 4.0–10.5)

## 2013-10-26 LAB — TROPONIN I: Troponin I: <0.3 ng/mL (ref <0.30)

## 2013-10-26 LAB — HIV ANTIBODY (ROUTINE TESTING W REFLEX): HIV: NONREACTIVE

## 2013-10-26 LAB — VANCOMYCIN, TROUGH: Vancomycin Tr: 9 ug/mL — ABNORMAL LOW (ref 10.0–20.0)

## 2013-10-26 MED ORDER — VANCOMYCIN HCL IN DEXTROSE 1-5 GM/200ML-% IV SOLN
1000.0000 mg | Freq: Three times a day (TID) | INTRAVENOUS | Status: DC
  Administered 2013-10-26 – 2013-10-27 (×3): 1000 mg via INTRAVENOUS
  Filled 2013-10-26 (×5): qty 200

## 2013-10-26 MED ORDER — LEVALBUTEROL HCL 0.63 MG/3ML IN NEBU
0.6300 mg | INHALATION_SOLUTION | Freq: Two times a day (BID) | RESPIRATORY_TRACT | Status: DC
  Administered 2013-10-26 – 2013-10-27 (×3): 0.63 mg via RESPIRATORY_TRACT
  Filled 2013-10-26 (×6): qty 3
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
## 2013-10-26 NOTE — Progress Notes (Signed)
Pts. Wife at bedside. Pts. Nitro tabs given to her to take home.

## 2013-10-26 NOTE — Progress Notes (Signed)
[place] for Vancomycin, Cefepime Indication: pneumonia  Allergies  Allergen Reactions  . Lisinopril Swelling    ANGIOEDEMA  . Ambien [Zolpidem] Other (See Comments)    Unknown reaction  . Lamictal [Lamotrigine] Other (See Comments)    Weakness/difficulty swallowing   Labs:  Recent Labs  10/23/13 1050 10/24/13 0457 10/26/13 0551  WBC 10.2 12.8* 8.9  HGB 10.3* 9.5* 8.6*  PLT 295 267 242  CREATININE 0.65 0.65 0.52   Estimated Creatinine Clearance: 90.4 ml/min (by C-G formula based on Cr of 0.52).  Recent Labs  10/26/13 0551  VANCOTROUGH 9.0*     Assessment: 17 YOM continues  Vancomycin and Cefepime for HCAP. The patient failed outpatient levaquin. WBC wnl. Pt is afebrile. CrCl ~ 89 mL/min  Vancomycin trough subtherapeutic Slow / little improvement  Cultures negative to date  Goal of Therapy:  Vancomycin trough level 15-20 mcg/ml  Plan:  Vancomycin 1 gram iv Q 8 hours Cefepime 1 gram iv Q 12 hours Continue to follow  Thank you. Anette Guarneri, PharmD [phone]

## 2013-10-26 NOTE — Progress Notes (Signed)
RN notified by NT that pt. Taking home meds while she was in the room. RN in the room to assess the pt. Pt. Stated he was having Angina and took 2 sublingual nitrotabs that he wears around his neck. Pt. Alert and oriented at the time. Vital signs stable. Pt. Stated that the pain in his chest was easing off and going away. Pt. Stated that the angina happens occasionally and the nitro helps when he has it. The nitro was labeled and placed at the nurses station to give to pts. Family member in the am. RN informed the pt. Of the risk of taking home medications as an inpatient. Pt. Stated he would not take any more medications from home. PT. Alert and stable. No s/s of distress noted. RN will continue to monitor pt. For changes in condition. Caterina Racine, Katherine Roan

## 2013-10-26 NOTE — Progress Notes (Signed)
OT Cancellation Note  Patient Details Name: HALIM SURRETTE MRN: [id number] DOB: 03-18-1945   Cancelled Treatment:    Reason Eval/Treat Not Completed: Other (comment) per nursing, hold on OT right now. Pt with some chest discomfort earlier per nursing and chart. Troponin ordered. Will check back later time.   Jules Schick [phone] 10/26/2013, 10:01 AM

## 2013-10-26 NOTE — Care Management Note (Signed)
    Page 1 of 1   10/26/2013     2:07:53 PM CARE MANAGEMENT NOTE 10/26/2013  Patient:  Malik Zuniga, Malik Zuniga   Account Number:  [id number]  Date Initiated:  10/26/2013  Documentation initiated by:  [facility]  Subjective/Objective Assessment:   68 y.o. male with a past medical history of chronic obstructive pulmonary disease, coronary disease, chronic back pain, who was admitted for Healthcare associated pneumonia.//Home with spouse.     Action/Plan:   IV abx.// Access for disposition services.   Anticipated DC Date:  10/27/2013   Anticipated DC Plan:  [place]  CM consult      [facility] Choice  HOME HEALTH   Choice offered to / List presented to:  [name]        [place] arranged  [place].   Status of service:  Completed, signed off Medicare Important Message given?  YES (If response is "NO", the following Medicare IM given date fields will be blank) Date Medicare IM given:  10/26/2013 Medicare IM given by:  [facility] Date Additional Medicare IM given:   Additional Medicare IM given by:    Discharge Disposition:  [place]  Per UR Regulation:  Reviewed for med. necessity/level of care/duration of stay  If discussed at [place] of Stay Meetings, dates discussed:    Comments:  10/26/13 1030 Monquie Fulgham J. Clydene Laming, RN, BSN, [employer] [phone] Spoke with pt at bedside regarding discharge planning for [facility]. Offered pt list of home health agencies to choose from.  Pt chose [facility] to render services. Janae Sauce, RN of [facility] notified.  No DME needs identified at this time.

## 2013-10-27 ENCOUNTER — Telehealth: Payer: Self-pay

## 2013-10-27 DIAGNOSIS — R259 Unspecified abnormal involuntary movements: Secondary | ICD-10-CM

## 2013-10-27 DIAGNOSIS — F319 Bipolar disorder, unspecified: Secondary | ICD-10-CM

## 2013-10-27 LAB — BASIC METABOLIC PANEL
ANION GAP: 18 — AB (ref 5–15)
BUN: 9 mg/dL (ref 6–23)
CALCIUM: 9 mg/dL (ref 8.4–10.5)
CO2: 23 mEq/L (ref 19–32)
CREATININE: 0.48 mg/dL — AB (ref 0.50–1.35)
Chloride: 91 mEq/L — ABNORMAL LOW (ref 96–112)
Glucose, Bld: 89 mg/dL (ref 70–99)
Potassium: 4.7 mEq/L (ref 3.7–5.3)
SODIUM: 132 meq/L — AB (ref 137–147)

## 2013-10-27 LAB — CBC
HCT: 26 % — ABNORMAL LOW (ref 39.0–52.0)
Hemoglobin: 8.7 g/dL — ABNORMAL LOW (ref 13.0–17.0)
MCH: 25.7 pg — AB (ref 26.0–34.0)
MCHC: 33.5 g/dL (ref 30.0–36.0)
MCV: 76.7 fL — AB (ref 78.0–100.0)
PLATELETS: 261 10*3/uL (ref 150–400)
RBC: 3.39 MIL/uL — ABNORMAL LOW (ref 4.22–5.81)
RDW: 15 % (ref 11.5–15.5)
WBC: 6.5 10*3/uL (ref 4.0–10.5)

## 2013-10-27 MED ORDER — AMOXICILLIN-POT CLAVULANATE 875-125 MG PO TABS
1.0000 | ORAL_TABLET | Freq: Two times a day (BID) | ORAL | Status: DC
  Administered 2013-10-27: 1 via ORAL
  Filled 2013-10-27 (×2): qty 1

## 2013-10-27 MED ORDER — AMOXICILLIN-POT CLAVULANATE 875-125 MG PO TABS: 1.0000 | ORAL_TABLET | Freq: Two times a day (BID) | ORAL | Status: DC
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
## 2013-10-27 NOTE — Telephone Encounter (Signed)
Admit date: 10/23/2013  Discharge date: 10/27/2013  Reason for admission: Healthcare-associated Pneumonia  Recommendations for Outpatient Follow-up:  1. Follow up with PCP in 1-2 weeks  Transition Care Management Follow-up Telephone Call  How have you been since you were released from the hospital? Pt is home, doing much better. Wife states "he's almost back to his old self."  Denies increased shortness of breath or wheezing.     Do you understand why you were in the hospital? yes   Do you understand the discharge instrcutions? yes  Items Reviewed:  Medications reviewed: yes; has not started Augmentin yet.  Wife still has to go to pharmacy to pick it up.    Allergies reviewed: yes  Dietary changes reviewed: yes, heart healthy  Referrals reviewed: yes; [facility]-PT/OT; family has not heard from them yet   Functional Questionnaire:   Activities of Daily Living (ADLs):   He states they are independent in the following: ambulation, bathing and hygiene, feeding, continence, grooming, toileting and dressing States they require assistance with the following: Wife provides minimal assistance as needed, particularly with bathing/dressing/shaving.    Any transportation issues/concerns?: no; wife provides transportation   Any patient concerns? no   Confirmed importance and date/time of follow-up visits scheduled: yes   Confirmed with patient if condition begins to worsen call PCP or go to the ER.  Patient was given the Call-a-Nurse line [phone]: yes  Hospital follow up appointment scheduled for 11/02/13 @ 11:30 am with Dr. [name].

## 2013-10-28 ENCOUNTER — Ambulatory Visit: Payer: Medicare Other | Admitting: Family Medicine

## 2013-10-28 ENCOUNTER — Telehealth: Payer: Self-pay | Admitting: Neurology

## 2013-10-28 NOTE — Telephone Encounter (Signed)
Wife calling to speak w/ Jade regarding labs and recent hosp stay. CB# [phone] / Sherri S.

## 2013-10-28 NOTE — Telephone Encounter (Signed)
Spoke with patient's wife and she states husband went back in the hospital from 10/23/13-10/27/13 due to pneumonia. He was treated and labs are currently back up to normal. They will call with any changes.

## 2013-10-31 DIAGNOSIS — J189 Pneumonia, unspecified organism: Secondary | ICD-10-CM | POA: Diagnosis not present

## 2013-10-31 DIAGNOSIS — J441 Chronic obstructive pulmonary disease with (acute) exacerbation: Secondary | ICD-10-CM | POA: Diagnosis not present

## 2013-10-31 DIAGNOSIS — F331 Major depressive disorder, recurrent, moderate: Secondary | ICD-10-CM | POA: Diagnosis not present

## 2013-10-31 DIAGNOSIS — M545 Low back pain, unspecified: Secondary | ICD-10-CM | POA: Diagnosis not present

## 2013-10-31 DIAGNOSIS — G8929 Other chronic pain: Secondary | ICD-10-CM | POA: Diagnosis not present

## 2013-10-31 DIAGNOSIS — J438 Other emphysema: Secondary | ICD-10-CM | POA: Diagnosis not present

## 2013-10-31 DIAGNOSIS — I2510 Atherosclerotic heart disease of native coronary artery without angina pectoris: Secondary | ICD-10-CM | POA: Diagnosis not present

## 2013-10-31 DIAGNOSIS — IMO0001 Reserved for inherently not codable concepts without codable children: Secondary | ICD-10-CM | POA: Diagnosis not present

## 2013-11-01 ENCOUNTER — Ambulatory Visit: Payer: Medicare Other | Admitting: Family Medicine

## 2013-11-02 ENCOUNTER — Ambulatory Visit (INDEPENDENT_AMBULATORY_CARE_PROVIDER_SITE_OTHER): Payer: Medicare Other | Admitting: Family Medicine

## 2013-11-02 ENCOUNTER — Encounter: Payer: Self-pay | Admitting: Family Medicine

## 2013-11-02 VITALS — BP 130/72 | HR 78 | Temp 96.5°F | Resp 17

## 2013-11-02 DIAGNOSIS — E871 Hypo-osmolality and hyponatremia: Secondary | ICD-10-CM

## 2013-11-02 DIAGNOSIS — J189 Pneumonia, unspecified organism: Secondary | ICD-10-CM

## 2013-11-02 DIAGNOSIS — B351 Tinea unguium: Secondary | ICD-10-CM

## 2013-11-02 LAB — BASIC METABOLIC PANEL
BUN: 13 mg/dL (ref 6–23)
CO2: 27 mEq/L (ref 19–32)
CREATININE: 0.7 mg/dL (ref 0.4–1.5)
Calcium: 9.4 mg/dL (ref 8.4–10.5)
Chloride: 99 mEq/L (ref 96–112)
GFR: 111.67 mL/min (ref >60.00)
Glucose, Bld: 97 mg/dL (ref 70–99)
POTASSIUM: 4.7 meq/L (ref 3.5–5.1)
Sodium: 136 mEq/L (ref 135–145)

## 2013-11-02 LAB — CBC WITH DIFFERENTIAL/PLATELET
BASOS PCT: 0 % (ref 0.0–3.0)
Basophils Absolute: 0 10*3/uL (ref 0.0–0.1)
Eosinophils Absolute: 0.1 10*3/uL (ref 0.0–0.7)
Eosinophils Relative: 1.2 % (ref 0.0–5.0)
HCT: 31.1 % — ABNORMAL LOW (ref 39.0–52.0)
Hemoglobin: 10 g/dL — ABNORMAL LOW (ref 13.0–17.0)
Lymphocytes Relative: 10.7 % — ABNORMAL LOW (ref 12.0–46.0)
Lymphs Abs: 1.2 10*3/uL (ref 0.7–4.0)
MCHC: 32.1 g/dL (ref 30.0–36.0)
MCV: 81.7 fl (ref 78.0–100.0)
MONOS PCT: 3.5 % (ref 3.0–12.0)
Monocytes Absolute: 0.4 10*3/uL (ref 0.1–1.0)
Neutro Abs: 9.2 10*3/uL — ABNORMAL HIGH (ref 1.4–7.7)
Neutrophils Relative %: 84.6 % — ABNORMAL HIGH (ref 43.0–77.0)
PLATELETS: 674 10*3/uL — AB (ref 150.0–400.0)
RBC: 3.81 Mil/uL — AB (ref 4.22–5.81)
RDW: 17.3 % — ABNORMAL HIGH (ref 11.5–15.5)
WBC: 10.9 10*3/uL — ABNORMAL HIGH (ref 4.0–10.5)

## 2013-11-02 NOTE — Patient Instructions (Signed)
Follow up in January for your 6 month BP and cholesterol check We'll notify you of your lab results and make any changes if needed Keep up the good work!  You look great! Try and add a protein supplement at least once daily Apply FungiNail topically as directed Call with any questions or concerns Happy Fall!!!

## 2013-11-02 NOTE — Progress Notes (Signed)
Pre visit review using our clinic review tool, if applicable. No additional management support is needed unless otherwise documented below in the visit note. 

## 2013-11-02 NOTE — Progress Notes (Signed)
   Subjective:    Patient ID: Malik Zuniga, male    DOB: [birth year], 68 y.o.   MRN: [id number]  [facility] f/u- pt was admitted 9/13-9/17 w/ hospital acquired PNA.  Was previously admitted on 9/6 and d/c'd on 9/7 w/ CAP.  Pt was treated w/ IV abx and dc'd home on Augmentin- course completed.  Was recommended to take Ensure twice daily for malnutrition but wife reports this is too expensive.  Pt reports he is still fatigued but SOB is 'ok'- 'where it's always been w/ my COPD'.  Cough is mostly gone.  No fevers.  Pt did have hyponatremia while hospitalized and wife reports 'he was out of his mind confused'.  L great toenail fungus- not painful, not peeling.  No redness or swelling.   Review of Systems For ROS see HPI     Objective:   Physical Exam  Vitals reviewed. Constitutional: He is oriented to person, place, and time. He appears well-developed. No distress.  Thin, cachetic appearing man in wheel chair  HENT:  Head: Normocephalic and atraumatic.  Cardiovascular: Normal rate, regular rhythm, normal heart sounds and intact distal pulses.   Pulmonary/Chest: Effort normal and breath sounds normal. No respiratory distress. He has no wheezes. He has no rales.  Neurological: He is alert and oriented to person, place, and time.  Skin: Skin is warm and dry.  L great toenail thickened w/ obvious fungal infxn          Assessment & Plan:

## 2013-11-03 ENCOUNTER — Other Ambulatory Visit: Payer: Self-pay | Admitting: Family Medicine

## 2013-11-03 DIAGNOSIS — D72829 Elevated white blood cell count, unspecified: Secondary | ICD-10-CM

## 2013-11-04 ENCOUNTER — Ambulatory Visit: Payer: Medicare Other | Admitting: Family Medicine

## 2013-11-06 DIAGNOSIS — B351 Tinea unguium: Secondary | ICD-10-CM | POA: Insufficient documentation

## 2013-11-06 NOTE — Assessment & Plan Note (Signed)
New.  Reviewed that due to pt's use of tylenol and need for statin as well as other medication, would not recommend systemic tx at this time.  Start topical Funginail.  Will follow.

## 2013-11-06 NOTE — Assessment & Plan Note (Signed)
New.  Pt has completed abx tx.  Currently asymptomatic- no cough, increased SOB, wheezing.  Pt looks better than I have seen in some time.  Will continue to follow.

## 2013-11-06 NOTE — Assessment & Plan Note (Signed)
Pt w/ hx of similar.  Noted during hospitalization but this was improving at time of DC.  Repeat labs today to trend.

## 2013-11-07 ENCOUNTER — Telehealth: Payer: Self-pay

## 2013-11-07 ENCOUNTER — Other Ambulatory Visit: Payer: Self-pay | Admitting: General Practice

## 2013-11-07 DIAGNOSIS — R109 Unspecified abdominal pain: Secondary | ICD-10-CM | POA: Diagnosis not present

## 2013-11-07 DIAGNOSIS — R1310 Dysphagia, unspecified: Secondary | ICD-10-CM | POA: Diagnosis not present

## 2013-11-07 DIAGNOSIS — IMO0001 Reserved for inherently not codable concepts without codable children: Secondary | ICD-10-CM | POA: Diagnosis not present

## 2013-11-07 DIAGNOSIS — J438 Other emphysema: Secondary | ICD-10-CM | POA: Diagnosis not present

## 2013-11-07 DIAGNOSIS — J441 Chronic obstructive pulmonary disease with (acute) exacerbation: Secondary | ICD-10-CM | POA: Diagnosis not present

## 2013-11-07 DIAGNOSIS — J189 Pneumonia, unspecified organism: Secondary | ICD-10-CM

## 2013-11-07 DIAGNOSIS — D508 Other iron deficiency anemias: Secondary | ICD-10-CM | POA: Diagnosis not present

## 2013-11-07 MED ORDER — SIMVASTATIN 40 MG PO TABS: 40.0000 mg | ORAL_TABLET | Freq: Every evening | ORAL | Status: DC

## 2013-11-07 NOTE — Telephone Encounter (Signed)
Received Home Health Certification via fax from [place].  Forms reviewed and signed by Dr. [name].  Forms faxed back to [facility] at [phone].  Fax confirmation received.  Forms placed in scan basket for scanning.  Billing sheet placed in red billing folder for Billing.

## 2013-11-15 ENCOUNTER — Telehealth: Payer: Self-pay | Admitting: Family Medicine

## 2013-11-15 NOTE — Telephone Encounter (Signed)
Caller name: Abdallah Relation to pt:  Call back [place]:  Reason for call:  Pt states that he is starting to have same symptoms that he had from his hospitalization.  Were told to call back if these symptoms presented.  Pt wants antibiotic, refused apt that was offered for the AM>  Advise.

## 2013-11-15 NOTE — Telephone Encounter (Signed)
Pt was supposed to return for repeat CBC- he has not.  Based on his recent hospitalization, pt needs evaluation.  No abx w/o appropriate OV.  Pt can be seen here or w/ pulmonary

## 2013-11-16 ENCOUNTER — Ambulatory Visit (INDEPENDENT_AMBULATORY_CARE_PROVIDER_SITE_OTHER): Payer: Medicare Other | Admitting: Pulmonary Disease

## 2013-11-16 ENCOUNTER — Encounter: Payer: Self-pay | Admitting: Pulmonary Disease

## 2013-11-16 VITALS — BP 98/76 | HR 90 | Temp 98.4°F

## 2013-11-16 DIAGNOSIS — J690 Pneumonitis due to inhalation of food and vomit: Secondary | ICD-10-CM

## 2013-11-16 DIAGNOSIS — J439 Emphysema, unspecified: Secondary | ICD-10-CM

## 2013-11-16 DIAGNOSIS — I2510 Atherosclerotic heart disease of native coronary artery without angina pectoris: Secondary | ICD-10-CM | POA: Diagnosis not present

## 2013-11-16 MED ORDER — AMOXICILLIN-POT CLAVULANATE 875-125 MG PO TABS: 1.0000 | ORAL_TABLET | Freq: Two times a day (BID) | ORAL | Status: DC

## 2013-11-16 NOTE — Telephone Encounter (Signed)
Can you call pt and try to schedule? I know he declined before.

## 2013-11-16 NOTE — Telephone Encounter (Signed)
Perfect!  If pulmonary unable to see, pt should get appt w/ any available provider

## 2013-11-16 NOTE — Assessment & Plan Note (Signed)
The patient has severe COPD, but fortunately is not having any rhonchi spasm or increased work of breathing. I have encouraged him to stay on his current medications.

## 2013-11-16 NOTE — Progress Notes (Signed)
   Subjective:    Patient ID: Malik Zuniga, male    DOB: 02-16-1945, 68 y.o.   MRN: [id number]  HPI The patient comes in today for an acute sick visit. He has known COPD, and has been in the hospital twice since his last visit here with pneumonia. I have reviewed his x-rays, and his chest CT the beginning of September showed a right lower lobe process, and his most recent chest x-ray showed a left lower lobe process. The patient does have poor dentition, but has not had any abscesses or severe caries.  He has had issues with swallowing dating back about 10 years ago, and underwent speech therapy for this. He now admits to having a cough with eating and drinking, and does feel that he aspirates with meals. He was doing well at discharge from the hospital, but starting yesterday began to have increasing chest congestion, await cough, but has not had purulence. He has also noted increased malaise.   Review of Systems  Constitutional: Positive for chills. Negative for fever and unexpected weight change.  HENT: Positive for congestion, postnasal drip and trouble swallowing. Negative for dental problem, ear pain, nosebleeds, rhinorrhea, sinus pressure, sneezing and sore throat.   Eyes: Negative for redness and itching.  Respiratory: Positive for cough, chest tightness, shortness of breath and wheezing.   Cardiovascular: Negative for palpitations and leg swelling.  Gastrointestinal: Positive for nausea. Negative for vomiting.  Genitourinary: Negative for dysuria.  Musculoskeletal: Negative for joint swelling.  Skin: Negative for rash.  Neurological: Negative for headaches.  Hematological: Does not bruise/bleed easily.  Psychiatric/Behavioral: Negative for dysphoric mood. The patient is not nervous/anxious.        Objective:   Physical Exam Frail-appearing male in no acute distress Nose without purulence or discharge noted Neck without lymphadenopathy or thyromegaly Chest with bibasilar  crackles, no wheezes, a few rhonchi noted Cardiac exam with regular rate and rhythm Lower extremities with minimal edema, no cyanosis Alert and oriented, moves all 4 extremities.       Assessment & Plan:

## 2013-11-16 NOTE — Telephone Encounter (Signed)
Called pt; spoke with wife and advised of providers instructions.  Pt and wife will contact pulmonary and try to get in with them.

## 2013-11-16 NOTE — Assessment & Plan Note (Signed)
The patient's x-rays show fleeting lower lobe infiltrates, and this is typically associated with aspiration pneumonia. The patient admits that he has had difficulties with aspiration in the past, and was actually followed by speech therapy. He now is having issues with his swallowing, and believes that he aspirates with meals. I will go ahead and treat him with a course of Augmentin, and will also get him referred for a speech therapy evaluation.

## 2013-11-16 NOTE — Patient Instructions (Signed)
Will treat with augmentin 875mg  one am and pm on full stomach with large glass of water. Will arrange for speech evaluation, and perhaps they can work with you on techniques to minimize aspiration.  Please call if you are getting worse, or not getting better.

## 2013-11-17 ENCOUNTER — Other Ambulatory Visit (HOSPITAL_COMMUNITY): Payer: Self-pay | Admitting: Pulmonary Disease

## 2013-11-17 DIAGNOSIS — R1310 Dysphagia, unspecified: Secondary | ICD-10-CM

## 2013-11-21 ENCOUNTER — Telehealth: Payer: Self-pay | Admitting: Pulmonary Disease

## 2013-11-22 NOTE — Telephone Encounter (Signed)
LM x 2  @ number given in message [phone] LM x 1 on mobile # ATC x 3 home #, kept getting disonnected

## 2013-11-22 NOTE — Telephone Encounter (Signed)
Spoke with pt - explained below to pt per Dr. [name].  He verbalized understanding and has no further questions at this time.  Dr. [name], pt has not yet had speech eval.  This is scheduled for Friday, Oct 16.

## 2013-11-22 NOTE — Telephone Encounter (Signed)
LMTCB

## 2013-11-22 NOTE — Telephone Encounter (Signed)
Has he had his speech eval yet?? It may be that he continues to cough as long as he continues to aspirate.  There are no meds that will stop cough if it is due to ongoing aspiration.

## 2013-11-22 NOTE — Telephone Encounter (Signed)
Last OV 11-16-13. Pt states he does not feel any improvement. He states that he does not feel his symptoms are worse, just not any better. He is still having a deep, wet, dry cough, wheezing, chest tightness, increased SOB. Tomorrow is his last dose of Augmentin. Please advise. [place] Bing, CMA Allergies  Allergen Reactions  . Lisinopril Swelling    ANGIOEDEMA  . Ambien [Zolpidem] Other (See Comments)    Unknown reaction  . Lamictal [Lamotrigine] Other (See Comments)    Weakness/difficulty swallowing

## 2013-11-25 ENCOUNTER — Ambulatory Visit (HOSPITAL_COMMUNITY)
Admission: RE | Admit: 2013-11-25 | Discharge: 2013-11-25 | Disposition: A | Discharge status: Home / Self Care | Payer: Medicare Other | Source: Ambulatory Visit | Attending: Pulmonary Disease | Admitting: Pulmonary Disease

## 2013-11-25 DIAGNOSIS — J189 Pneumonia, unspecified organism: Secondary | ICD-10-CM | POA: Diagnosis not present

## 2013-11-25 DIAGNOSIS — R1313 Dysphagia, pharyngeal phase: Secondary | ICD-10-CM | POA: Diagnosis not present

## 2013-11-25 DIAGNOSIS — R05 Cough: Secondary | ICD-10-CM | POA: Diagnosis not present

## 2013-11-25 DIAGNOSIS — Z9981 Dependence on supplemental oxygen: Secondary | ICD-10-CM | POA: Diagnosis not present

## 2013-11-25 DIAGNOSIS — J449 Chronic obstructive pulmonary disease, unspecified: Secondary | ICD-10-CM | POA: Insufficient documentation

## 2013-11-25 DIAGNOSIS — E871 Hypo-osmolality and hyponatremia: Secondary | ICD-10-CM | POA: Diagnosis not present

## 2013-11-25 DIAGNOSIS — Z8673 Personal history of transient ischemic attack (TIA), and cerebral infarction without residual deficits: Secondary | ICD-10-CM | POA: Diagnosis not present

## 2013-11-25 DIAGNOSIS — R1310 Dysphagia, unspecified: Secondary | ICD-10-CM | POA: Insufficient documentation

## 2013-11-25 DIAGNOSIS — B351 Tinea unguium: Secondary | ICD-10-CM | POA: Diagnosis not present

## 2013-11-25 NOTE — Procedures (Signed)
Objective Swallowing Evaluation: Modified Barium Swallowing Study  Patient Details  Name: Malik Zuniga MRN: [id number] Date of Birth: 23-Jun-1945  Today's Date: 11/25/2013 Time: 1130-1230 SLP Time Calculation (min): 60 min  Past Medical History:  Past Medical History  Diagnosis Date  . Hyperlipidemia     takes Simvastatin daily  . Tremor   . History of prostate cancer   . On home O2   . Neuromuscular disorder 1998    right carpal tunnel release  . Anemia associated with acute blood loss   . Cancer 2004    prostate  . GERD (gastroesophageal reflux disease)     takes Omeprazole daily  . Hypertension     takes Metoprolol daily as well as Hyzaar  . Constipation     takes Colace daily as well as Miralax  . Anxiety     takes Valium daily  . Depression     takes Cymbalta daily  . Emphysema of lung     Albuterol as needed;Symbicort daily and Singulair at bedtime  . Emphysema   . Myocardial infarction 04/1998  . Coronary artery disease   . Asthma   . Shortness of breath     with exertion  . History of bronchitis   . Aspiration pneumonia 2010  . Headache, chronic daily   . History of migraine     last migraine a couple of days ago;takes Excedrin Migraine  . Chronic back pain     compression fracture  . History of colon polyps   . Mood change     after Brain surgery mood changed and was placed on Depakote  . Insomnia     takes Benadryl nightly  . 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 Cholecystectomy  1996  . 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 Annette Stable  . Vascular stent  2000    Dr. Rollene Fare; he reports cardiac stent, but denies stent for PAD 06/15/13  . Hand surgery  1989    crushed thumb, Dr. Fredna Dow  . Ulnar nerve repair  1998    left arm, Dr. Fredna Dow  . Gastrostomy w/ feeding tube  2008    Dr. Watt Climes; only had for a few months  . Prostatectomy  04/2001    removal of prostate cancer, Dr. Janice Norrie   . Coronary angioplasty with stent placement  04/1998  . Carpal tunnel release  1998    right  . Spine surgery    . 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 Back surgery  08/2004; 02/2005; 04/2006; 06/2007; 7/20101    all by Dr. Annette Stable  . Esophagogastroduodenoscopy N/A 07/23/2012    Procedure: ESOPHAGOGASTRODUODENOSCOPY (EGD);  Surgeon: Jeryl Columbia, MD;  Location: [facility] ENDOSCOPY;  Service: Endoscopy;  Laterality: N/A;  buccini /ja  . Colonoscopy    . Back surgery  05/2013   HPI:  68 y.o. male referred for OPMBS due to ongoing ongoing chest congestion and cough associated with PO intake.  Pt recently evaluated by Dr. [name].  Per MD description, admits to having a cough with eating and drinking, and does feel that he aspirates with meals.  Chest CT beginning of September showed a right lower lobe process, and his most recent chest x-ray showed a left lower lobe process. The patient does have poor dentition, but has not had any abscesses or severe caries. He has had issues with  swallowing dating back about ten years, and has undergone several swallowing evaluations.  Last MBS 12/12 revealed: "mild-moderate pharyngeal dysphagia. Overall laryngeal/pharyngeal contraction appears adequate, however significant pyriform sinus residue persists with all tested consistencies, very likely due to impeding C4,5-6 bone spurs from anterior aspect of cervical vertebrae into the cricopharyngeus space. Evidence of vallecue stasis of puree and barium pill may be related to decreased base of tongue and hyoid-laryngeal function. Use of a chin down posture, multiple swallows and a hard cough minimized residue and laryngeal penetrates. No aspiration observed in this study, however risk factors for aspiration pna remain as patient likely may be aspirating silently throughout day, across several meals."   Hx includes COPD; PEG 2008; craniotomy with aneurysm clipping 1999; [place]; GERD; multiple back surgeries.  Pt states that coughing  is so constant, he is embarrassed to eat in public places.        Assessment / Plan / Recommendation Clinical Impression  Dysphagia Diagnosis: Mild pharyngeal phase dysphagia  Clinical impression: Pt presents with a mild pharyngeal dysphagia which presents similarly to last MBS in December of 2012.  Presence of osteophytes do not appear to interfere with function.  There is generalized weakness, etiology unclear, specific to reduced base-of-tongue strength and decreased mobility of hyolaryngeal complex.  Weakness leads to residue in the valleculae and pyriforms, the latter of which contributes to overflow and spillage into larynx (with thin liquids).  Pt has a consistent cough response in reaction to penetration to the vocal cords.  There was trace aspiration anteriorly, which was generally ejected from larynx with cough.   Pt and wife reviewed today's and 2012 video.  We discussed weakness and impact upon swallow.  Pt was provided/taught two focal exercises to target area of weakness, given instructions for frequency, basic precautions, and asked to execute exercise program for 4-6 weeks.  Recommend continuing regular diet with thin liquids; this SLP will f/u with pt in 4-6 weeks via phone to assess subjective improvement.  Pt agrees to plan.     Treatment Recommendation    follow home therapeutic exercise program   Diet Recommendation Regular;Thin liquid   Liquid Administration via: Cup;Straw Medication Administration: Whole meds with liquid Supervision: Patient able to self feed Compensations: Slow rate;Small sips/bites;Follow solids with liquid    Other  Recommendations Oral Care Recommendations: Oral care BID   Follow Up Recommendations  None    SLP Swallow Goals     General Date of Onset:  (chronic) HPI: 68 y.o. male referred for OPMBS due to ongoing ongoing chest congestion and cough associated with PO intake.  Pt recently evaluated by Dr. [name].  Per MD description, admits to  having a cough with eating and drinking, and does feel that he aspirates with meals.  Chest CT beginning of September showed a right lower lobe process, and his most recent chest x-ray showed a left lower lobe process. The patient does have poor dentition, but has not had any abscesses or severe caries. He has had issues with swallowing dating back about ten years, and has undergone several swallowing evaluations.  Last MBS 12/12 revealed: "mild-moderate pharyngeal dysphagia. Overall laryngeal/pharyngeal contraction appears adequate, however significant pyriform sinus residue persists with all tested consistencies, very likely due to impeding C4,5-6 bone spurs from anterior aspect of cervical vertebrae into the cricopharyngeus space. Evidence of vallecue stasis of puree and barium pill may be related to decreased base of tongue and hyoid-laryngeal function. Use of a chin down posture, multiple swallows and  a hard cough minimized residue and laryngeal penetrates. No aspiration observed in this study, however risk factors for aspiration pna remain as patient likely may be aspirating silently throughout day, across several meals."   Hx includes COPD; PEG 2008; craniotomy with aneurysm clipping 1999; [place]; GERD; multiple back surgeries.  Pt states that coughing is so constant, he is embarrassed to eat in public places.    Type of Study: Modified Barium Swallowing Study Reason for Referral: Objectively evaluate swallowing function Previous Swallow Assessment: see history Diet Prior to this Study: Regular;Thin liquids Temperature Spikes Noted: No Respiratory Status: Room air History of Recent Intubation: No Behavior/Cognition: Alert Oral Cavity - Dentition: Adequate natural dentition Oral Motor / Sensory Function: Within functional limits Self-Feeding Abilities: Able to feed self Patient Positioning: Upright in chair Baseline Vocal Quality: Clear Volitional Cough: Strong Volitional Swallow: Able to  elicit Anatomy:  (presence of osteophytes - minimal change from 2012 study) Pharyngeal Secretions: Not observed secondary MBS    Reason for Referral Objectively evaluate swallowing function   Oral Phase Oral Preparation/Oral Phase Oral Phase: WFL   Pharyngeal Phase Pharyngeal Phase Pharyngeal Phase: Impaired Pharyngeal - Nectar Pharyngeal - Nectar Cup: Reduced laryngeal elevation;Reduced tongue base retraction;Pharyngeal residue - valleculae;Pharyngeal residue - pyriform sinuses Pharyngeal - Thin Pharyngeal - Thin Cup: Reduced laryngeal elevation;Reduced tongue base retraction;Pharyngeal residue - valleculae;Pharyngeal residue - pyriform sinuses;Trace aspiration;Penetration/Aspiration during swallow Penetration/Aspiration details (thin cup): Material enters airway, passes BELOW cords then ejected out Pharyngeal - Solids Pharyngeal - Puree: Reduced laryngeal elevation;Reduced tongue base retraction;Pharyngeal residue - valleculae;Pharyngeal residue - pyriform sinuses  Cervical Esophageal Phase   Terilynn Buresh L. Tivis Ringer, [place] CCC/SLP Pager [phone]               Juan Quam Laurice 11/25/2013, 12:58 PM

## 2013-11-29 ENCOUNTER — Telehealth: Payer: Self-pay | Admitting: Pulmonary Disease

## 2013-11-29 MED ORDER — LEVOFLOXACIN 500 MG PO TABS: 500.0000 mg | ORAL_TABLET | Freq: Every day | ORAL | Status: DC

## 2013-11-29 NOTE — Telephone Encounter (Signed)
Per SN- call in levaquin 500 mg # 7 daily  Also, take mucinex dm 2 tablets bid and increase fluids  If no better will need ov  I called and spoke with pt's spouse and notified of recs per SN  She verbalized understanding  Rx was sent to pharm

## 2013-11-29 NOTE — Telephone Encounter (Signed)
Spoke with leigh. SN is doc of the day on tuesdays still. Will forward over to him. Please advise thanks

## 2013-11-29 NOTE — Telephone Encounter (Signed)
Spoke with the pt's spouse  She states that the pt is still doing a lot of coughing  He was seen last on 11/16/13 and given augmentin  This helped, but for the past 2 days pt having increased cough and "just feels bad" Cough is non prod- taking delsym with no relief  Pt denies fever or SOB  Refused appt for today  Allergies  Allergen Reactions  . Lisinopril Swelling    ANGIOEDEMA  . Ambien [Zolpidem] Other (See Comments)    Unknown reaction  . Lamictal [Lamotrigine] Other (See Comments)    Weakness/difficulty swallowing    Please advise, thanks!

## 2013-11-30 ENCOUNTER — Telehealth: Payer: Self-pay | Admitting: Pulmonary Disease

## 2013-11-30 NOTE — Telephone Encounter (Signed)
Called pt. He reports he saw speech therapy last week over at [facility] and was given exercises to do. He reports he was not told when to go back. Will make [facility] aware

## 2013-11-30 NOTE — Telephone Encounter (Signed)
Let pt know I am very surprised they did not recommend ongoing evaluation, given that he did aspirate.  Will see how he does with him following their recommendations closely.  If the continues to get aspiration pna, will refer him to speech as outpt to see him more regularly.

## 2013-11-30 NOTE — Telephone Encounter (Signed)
Please call pt and find out if he rescheduled his speech evaluation.  We cannot keep giving him abx, without trying to correct the actual problem.

## 2013-12-01 NOTE — Telephone Encounter (Signed)
lmomtcb x1 

## 2013-12-01 NOTE — Telephone Encounter (Signed)
Called made pt aware of below. He voiced understanding and will follow recs closely from speech. Nothing further needed

## 2013-12-06 ENCOUNTER — Telehealth: Payer: Self-pay | Admitting: Pulmonary Disease

## 2013-12-06 NOTE — Telephone Encounter (Signed)
Pt needs ov with someone for evaluation, and cxr the same day.  The problem is that he is aspirating, and things will not get better until this is stopped.  Hopefully speech can help him, but if not, may have to consider a feeding tube as we discussed.

## 2013-12-06 NOTE — Telephone Encounter (Signed)
Pt finished Augmentin 3 days ago.  Still c/o prod cough (white), chest congestion, some sob, occas wheezing.  Denies fever.  Pt questions if the pneumonia is gone or not. Please advise.  Allergies  Allergen Reactions  . Lisinopril Swelling    ANGIOEDEMA  . Ambien [Zolpidem] Other (See Comments)    Unknown reaction  . Lamictal [Lamotrigine] Other (See Comments)    Weakness/difficulty swallowing

## 2013-12-06 NOTE — Telephone Encounter (Signed)
Spoke with patient-he will be here tomorrow at 11am to see [facility] and have CXR same day. Nothing more needed at this time.

## 2013-12-07 ENCOUNTER — Ambulatory Visit (INDEPENDENT_AMBULATORY_CARE_PROVIDER_SITE_OTHER)
Admission: RE | Admit: 2013-12-07 | Discharge: 2013-12-07 | Disposition: A | Discharge status: Home / Self Care | Payer: Medicare Other | Source: Ambulatory Visit | Attending: Pulmonary Disease | Admitting: Pulmonary Disease

## 2013-12-07 ENCOUNTER — Encounter: Payer: Self-pay | Admitting: Pulmonary Disease

## 2013-12-07 ENCOUNTER — Ambulatory Visit (INDEPENDENT_AMBULATORY_CARE_PROVIDER_SITE_OTHER): Payer: Medicare Other | Admitting: Pulmonary Disease

## 2013-12-07 VITALS — BP 122/68 | HR 86 | Temp 96.8°F

## 2013-12-07 DIAGNOSIS — J438 Other emphysema: Secondary | ICD-10-CM | POA: Diagnosis not present

## 2013-12-07 DIAGNOSIS — J439 Emphysema, unspecified: Secondary | ICD-10-CM

## 2013-12-07 DIAGNOSIS — J690 Pneumonitis due to inhalation of food and vomit: Secondary | ICD-10-CM | POA: Diagnosis not present

## 2013-12-07 DIAGNOSIS — I2510 Atherosclerotic heart disease of native coronary artery without angina pectoris: Secondary | ICD-10-CM | POA: Diagnosis not present

## 2013-12-07 DIAGNOSIS — H2513 Age-related nuclear cataract, bilateral: Secondary | ICD-10-CM | POA: Diagnosis not present

## 2013-12-07 DIAGNOSIS — J449 Chronic obstructive pulmonary disease, unspecified: Secondary | ICD-10-CM | POA: Diagnosis not present

## 2013-12-07 DIAGNOSIS — J189 Pneumonia, unspecified organism: Secondary | ICD-10-CM | POA: Diagnosis not present

## 2013-12-07 MED ORDER — IPRATROPIUM-ALBUTEROL 0.5-2.5 (3) MG/3ML IN SOLN: 3.0000 mL | Freq: Four times a day (QID) | RESPIRATORY_TRACT | Status: AC

## 2013-12-07 MED ORDER — IPRATROPIUM-ALBUTEROL 0.5-2.5 (3) MG/3ML IN SOLN: 3.0000 mL | Freq: Four times a day (QID) | RESPIRATORY_TRACT | Status: DC

## 2013-12-07 MED ORDER — BUDESONIDE 0.25 MG/2ML IN SUSP: 0.5000 mg | Freq: Two times a day (BID) | RESPIRATORY_TRACT | Status: AC

## 2013-12-07 MED ORDER — BUDESONIDE 0.25 MG/2ML IN SUSP: 0.2500 mg | Freq: Two times a day (BID) | RESPIRATORY_TRACT | Status: DC

## 2013-12-07 NOTE — Patient Instructions (Addendum)
Stop symbicort and spiriva Will start on duonebs 4 times a day by nebulizer (breakfast, lunch, dinner, bedtime) Add budesonide 0.5mg  to nebulizer 2 treatments a day Can still use albuterol inhaler for rescue when away from home. Try to work hard on speech therapy recommendations to limit your aspiration.  followup with me in 77mos, but call if you are having issues.

## 2013-12-07 NOTE — Progress Notes (Signed)
   Subjective:    Patient ID: Malik Zuniga, male    DOB: 1945/09/10, 68 y.o.   MRN: [id number]  HPI The patient comes in today for an acute sick visit. He has a known history of recurrent pneumonia that is felt secondary to ongoing aspiration. He has been evaluated by speech therapy, and given techniques to help with this. Despite this, he continues to have significant cough with eating and drinking according to his family. He has been treated with a prolonged course of antibiotics after a recent episode of pneumonia, but continues to have a lot of cough and rattling secretions. Of note, his chest x-ray today shows resolution of his prior pneumonic infiltrate.   Review of Systems  Constitutional: Negative for fever and unexpected weight change.  HENT: Positive for congestion, postnasal drip, rhinorrhea, sinus pressure, sore throat and trouble swallowing. Negative for dental problem, ear pain, nosebleeds and sneezing.   Eyes: Negative for redness and itching.  Respiratory: Positive for cough, chest tightness and shortness of breath. Negative for wheezing.   Cardiovascular: Negative for palpitations and leg swelling.  Gastrointestinal: Negative for nausea and vomiting.  Genitourinary: Negative for dysuria.  Musculoskeletal: Negative for joint swelling.  Skin: Negative for rash.  Neurological: Negative for headaches.  Hematological: Does not bruise/bleed easily.  Psychiatric/Behavioral: Negative for dysphoric mood. The patient is not nervous/anxious.        Objective:   Physical Exam Frail-appearing male in no acute distress. Nose without purulence or discharge noted Neck without lymphadenopathy or thyromegaly Chest with a few basilar crackles, but fairly clear overall. Cardiac exam with regular rate and rhythm Lower extremities with mild edema, no cyanosis Alert and oriented, moves all 4 extremities.       Assessment & Plan:

## 2013-12-07 NOTE — Assessment & Plan Note (Signed)
The patient has a history of recurrent aspiration pneumonia, and is just finishing up a prolonged course of antibiotics. His chest x-ray today shows resolution of his infiltrate, and therefore I think his ongoing symptoms today are related to cough mechanics, mucociliary clearance, and some degree of persistent airway inflammation. He has a known history of significant COPD, and perhaps he would benefit from changing his inhaled bronchodilators to a nebulized form. I am hoping this will help with better lung deposition, and also improved pulmonary hygiene. I have stressed to the patient the importance of sticking to the speech therapy recommendations, and ultimately he may need ongoing therapy with speech. I also discussed with him the possibility of a feeding tube, and he will give this some thought.

## 2013-12-07 NOTE — Assessment & Plan Note (Signed)
The patient has severe airflow obstruction on prior spirometry, however there is no evidence on exam today to suggest an acute exacerbation.

## 2013-12-08 ENCOUNTER — Telehealth: Payer: Self-pay | Admitting: Pulmonary Disease

## 2013-12-08 NOTE — Telephone Encounter (Signed)
Called and spoke with Joycelyn Schmid  From the pharmacy and she is asking if we can change the pts budesonide to 0.5  1 vial BID.  She stated that his insurance will only pay for #6 vials per month for the pt.  [place] please advise. Thanks  Allergies  Allergen Reactions  . Lisinopril Swelling    ANGIOEDEMA  . Ambien [Zolpidem] Other (See Comments)    Unknown reaction  . Lamictal [Lamotrigine] Other (See Comments)    Weakness/difficulty swallowing    Current Outpatient Prescriptions on File Prior to Visit  Medication Sig Dispense Refill  . acetaminophen (TYLENOL) 325 MG tablet Take 650 mg by mouth every 6 (six) hours as needed for mild pain.      Marland Kitchen albuterol (PROVENTIL HFA;VENTOLIN HFA) 108 (90 BASE) MCG/ACT inhaler Inhale 2 puffs into the lungs every 4 (four) hours as needed for wheezing or shortness of breath.      . ARIPiprazole (ABILIFY) 5 MG tablet Take 5 mg by mouth every morning.      . Ascorbic Acid (VITAMIN C) 1000 MG tablet Take 1,000 mg by mouth daily.      Marland Kitchen aspirin-acetaminophen-caffeine (EXCEDRIN MIGRAINE) 250-250-65 MG per tablet Take 2 tablets by mouth every 6 (six) hours as needed. For headaches      . budesonide (PULMICORT) 0.25 MG/2ML nebulizer solution Take 4 mLs (0.5 mg total) by nebulization 2 (two) times daily.  60 mL  3  . budesonide-formoterol (SYMBICORT) 160-4.5 MCG/ACT inhaler Inhale 2 puffs into the lungs 2 (two) times daily.      . cholecalciferol (VITAMIN D) 1000 UNITS tablet Take 1,000 Units by mouth every morning.       . cyclobenzaprine (FLEXERIL) 10 MG tablet Take 10 mg by mouth daily as needed for muscle spasms.       . diazepam (VALIUM) 5 MG tablet Take 5 mg by mouth 3 (three) times daily. scheduled      . divalproex (DEPAKOTE) 500 MG 24 hr tablet Take 1,000 mg by mouth at bedtime.       . docusate sodium (COLACE) 100 MG capsule Take 200 mg by mouth at bedtime.       . DULoxetine (CYMBALTA) 60 MG capsule Take 60 mg by mouth daily.      . fluticasone (FLONASE) 50  MCG/ACT nasal spray Place 2 sprays into both nostrils daily.      Marland Kitchen HYDROcodone-acetaminophen (NORCO/VICODIN) 5-325 MG per tablet Take 1 tablet by mouth every 4 (four) hours as needed for moderate pain.       Marland Kitchen ipratropium-albuterol (DUONEB) 0.5-2.5 (3) MG/3ML SOLN Take 3 mLs by nebulization every 6 (six) hours.  360 mL  3  . losartan-hydrochlorothiazide (HYZAAR) 50-12.5 MG per tablet Take 1 tablet by mouth daily.      . metoprolol succinate (TOPROL-XL) 50 MG 24 hr tablet Take 50 mg by mouth every morning. Take with or immediately following a meal.      . montelukast (SINGULAIR) 10 MG tablet Take 10 mg by mouth at bedtime.      . nitroGLYCERIN (NITROSTAT) 0.4 MG SL tablet Place 0.4 mg under the tongue every 5 (five) minutes as needed for chest pain.      . Omega-3 Fatty Acids (FISH OIL) 1000 MG CAPS Take 1 capsule by mouth every morning.       Marland Kitchen omeprazole (PRILOSEC) 40 MG capsule Take 40 mg by mouth 2 (two) times daily.       . polyethylene glycol (MIRALAX / GLYCOLAX)  packet Take 17 g by mouth 2 (two) times daily. For constipation      . primidone (MYSOLINE) 250 MG tablet Take 250 mg by mouth 2 (two) times daily.      . Simethicone (MYLANTA GAS PO) Take 30 mLs by mouth daily as needed (for indigestion).       . simvastatin (ZOCOR) 40 MG tablet Take 1 tablet (40 mg total) by mouth every evening.  90 tablet  1  . tiotropium (SPIRIVA) 18 MCG inhalation capsule Place 18 mcg into inhaler and inhale daily.      . Zinc 50 MG TABS Take 50 mg by mouth daily.        No current facility-administered medications on file prior to visit.

## 2013-12-09 NOTE — Telephone Encounter (Signed)
[facility] can be reached at [phone].  Aware that [facility] is not in the office today-would like to know if another doctor can address this? Malik Zuniga

## 2013-12-09 NOTE — Telephone Encounter (Signed)
I called spoke with Joycelyn Schmid and gave VO for budesonide 0.5 1 vial BID. Will forward to Dr. [name] as an Malik Zuniga

## 2013-12-09 NOTE — Telephone Encounter (Signed)
Called spoke with Joycelyn Schmid. RX for budesonide was sent in for 0.25 2 vials BID. Medicare will not cover medication like this. They wanting to change RX to state budesonide 0.5 1 vial BID. [place] is out until Monday but Joycelyn Schmid would like this addressed today. Please advise [place] thanks

## 2013-12-09 NOTE — Telephone Encounter (Signed)
Change script to budesonide 0.5 mg bid.

## 2013-12-27 ENCOUNTER — Other Ambulatory Visit: Payer: Self-pay | Admitting: General Practice

## 2013-12-27 MED ORDER — PRIMIDONE 250 MG PO TABS: 250.0000 mg | ORAL_TABLET | Freq: Two times a day (BID) | ORAL | Status: DC

## 2013-12-30 ENCOUNTER — Other Ambulatory Visit: Payer: Self-pay | Admitting: General Practice

## 2013-12-30 MED ORDER — PRIMIDONE 250 MG PO TABS: 250.0000 mg | ORAL_TABLET | Freq: Two times a day (BID) | ORAL | Status: DC

## 2014-01-02 DIAGNOSIS — K5900 Constipation, unspecified: Secondary | ICD-10-CM | POA: Diagnosis not present

## 2014-01-02 DIAGNOSIS — R1310 Dysphagia, unspecified: Secondary | ICD-10-CM | POA: Diagnosis not present

## 2014-01-03 ENCOUNTER — Other Ambulatory Visit: Payer: Self-pay | Admitting: General Practice

## 2014-01-03 MED ORDER — MONTELUKAST SODIUM 10 MG PO TABS: 10.0000 mg | ORAL_TABLET | Freq: Every day | ORAL | Status: DC

## 2014-01-10 ENCOUNTER — Telehealth: Payer: Self-pay | Admitting: Pulmonary Disease

## 2014-01-10 NOTE — Telephone Encounter (Signed)
I said that if he keeps having issues, he may need ongoing speech therapy on a regular basis to help with problem or possibly a feeding tube.   I was hoping the nebulized meds would help with secretion clearance.

## 2014-01-10 NOTE — Telephone Encounter (Signed)
Called spoke with pt spouse [name]. She reports [place] wanted pt to have another swallowing study. I looked at last OV note and did not see this stated.  She would like clarification if he needs this repeated or not.  Also she reports to disregard the note about the nebulizer medication Please advise [place] thanks

## 2014-01-10 NOTE — Telephone Encounter (Signed)
Called and spoke to pt's spouse, [name]. Informed [name] of the recs per [facility]. [name] stated the pt has improved since last OV and is doing better. [name] stated the pt has not been taking the neb meds as often as prescribed and will get pt on a better regimen and will f/u with [place] at appt on 03/09/14. [name] aware to call back if needed.   Will forward to [facility] as FYI.

## 2014-01-11 DIAGNOSIS — S22000D Wedge compression fracture of unspecified thoracic vertebra, subsequent encounter for fracture with routine healing: Secondary | ICD-10-CM | POA: Diagnosis not present

## 2014-02-10 DIAGNOSIS — C3490 Malignant neoplasm of unspecified part of unspecified bronchus or lung: Secondary | ICD-10-CM

## 2014-02-10 HISTORY — DX: Malignant neoplasm of unspecified part of unspecified bronchus or lung: C34.90

## 2014-02-10 NOTE — Telephone Encounter (Signed)
Refill had been denied Nov 2014. Cleaning out MD in basket.

## 2014-02-20 ENCOUNTER — Telehealth: Payer: Self-pay | Admitting: Pulmonary Disease

## 2014-02-20 ENCOUNTER — Telehealth: Payer: Self-pay

## 2014-02-20 MED ORDER — AMOXICILLIN-POT CLAVULANATE 875-125 MG PO TABS: 1.0000 | ORAL_TABLET | Freq: Two times a day (BID) | ORAL | Status: DC

## 2014-02-20 NOTE — Telephone Encounter (Signed)
error 

## 2014-02-20 NOTE — Telephone Encounter (Signed)
Spoke with pt. He reports he thinks he has a chest cold. C/o chest congestion, dry cough, wheezing, chest tx, PND, nasal congestion started yesterday. Pt has not been able to get delsym. Pt is requesting recs. Please advise [place] thanks  Allergies  Allergen Reactions  . Lisinopril Swelling    ANGIOEDEMA  . Ambien [Zolpidem] Other (See Comments)    Unknown reaction  . Lamictal [Lamotrigine] Other (See Comments)    Weakness/difficulty swallowing

## 2014-02-20 NOTE — Telephone Encounter (Signed)
I called spoke with pt. He wants to try the cough syrup but would like the augementin sent in to have on hold. Nothing further needed

## 2014-02-20 NOTE — Telephone Encounter (Signed)
If he can't find delsym, then can try robitussin DM.  Can take 2 teaspoons every 6 hrs if needed.   A lot of his coughing is coming from aspiration, and he needs to stick with recommendations the speech therapist gave him with eating and drinking.  If he feels he is developing another chest infection, can send in a prescription for augmentin 875mg  one bid for 5 days.

## 2014-02-22 ENCOUNTER — Ambulatory Visit: Payer: Medicare Other | Admitting: Family Medicine

## 2014-02-27 ENCOUNTER — Other Ambulatory Visit: Payer: Self-pay | Admitting: General Practice

## 2014-02-27 DIAGNOSIS — F331 Major depressive disorder, recurrent, moderate: Secondary | ICD-10-CM | POA: Diagnosis not present

## 2014-02-27 MED ORDER — METOPROLOL SUCCINATE ER 50 MG PO TB24: 50.0000 mg | ORAL_TABLET | Freq: Every morning | ORAL | Status: DC

## 2014-03-09 ENCOUNTER — Ambulatory Visit: Payer: Medicare Other | Admitting: Pulmonary Disease

## 2014-03-10 ENCOUNTER — Encounter: Payer: Self-pay | Admitting: Pulmonary Disease

## 2014-03-10 ENCOUNTER — Ambulatory Visit (INDEPENDENT_AMBULATORY_CARE_PROVIDER_SITE_OTHER): Payer: Medicare Other | Admitting: Pulmonary Disease

## 2014-03-10 VITALS — BP 122/64 | HR 82 | Temp 97.0°F | Ht 70.0 in | Wt 160.0 lb

## 2014-03-10 DIAGNOSIS — J438 Other emphysema: Secondary | ICD-10-CM | POA: Diagnosis not present

## 2014-03-10 DIAGNOSIS — J690 Pneumonitis due to inhalation of food and vomit: Secondary | ICD-10-CM | POA: Diagnosis not present

## 2014-03-10 NOTE — Assessment & Plan Note (Signed)
The patient has only required antibiotics one time since his last visit, and his wife thinks he is doing a better job with his swallowing precautions.

## 2014-03-10 NOTE — Patient Instructions (Signed)
Stay on your duoneb 4 times as day as much as possible, and keep adding budesonide to 2 treatments a day. Check your insurance to see if they cover Brovana or Performist.  These are used just twice a day Continue to be careful with your swallowing. followup with me again in 35mos if doing well.

## 2014-03-10 NOTE — Assessment & Plan Note (Signed)
The patient seems to be doing fairly well from a COPD standpoint. He finds it difficult to get 4 nebulizer treatments in each day, but always gets 3 at least.  I discussed with him changing his medications to brovana or performist, but I have told him these are expensive. He will check with his insurance to see if they are covered, and let us know. In the meantime, he is to stay on 4 treatments a day.

## 2014-03-10 NOTE — Progress Notes (Signed)
   Subjective:    Patient ID: Malik Zuniga, male    DOB: 1945/07/02, 69 y.o.   MRN: [id number]  HPI The patient comes in today for follow-up of his known COPD, and also recurrent aspiration pneumonia. He is only had one infection since the last visit and required a course of Augmentin. He currently feels that he is better, and his wife states that he is having less difficulty with his swallowing. At the last visit, we changed him to nebulized bronchodilators for better deposition, but he is having difficulty getting in 4 treatments a day. I have discussed with him the possibility of using a nebulized LABA, but it is expensive. He denies any significant cough, congestion, or mucus at this time.   Review of Systems  Constitutional: Negative for fever and unexpected weight change.  HENT: Positive for congestion, postnasal drip and trouble swallowing. Negative for dental problem, ear pain, nosebleeds, rhinorrhea, sinus pressure, sneezing and sore throat.   Eyes: Negative for redness and itching.  Respiratory: Positive for cough, chest tightness, shortness of breath and wheezing.   Cardiovascular: Negative for palpitations and leg swelling.  Gastrointestinal: Negative for nausea and vomiting.  Genitourinary: Negative for dysuria.  Musculoskeletal: Negative for joint swelling.  Skin: Negative for rash.  Neurological: Negative for headaches.  Hematological: Does not bruise/bleed easily.  Psychiatric/Behavioral: Negative for dysphoric mood. The patient is not nervous/anxious.        Objective:   Physical Exam Thin male in no acute distress Nose without purulence or discharge noted Neck without lymphadenopathy or thyromegaly Chest with decreased breath sounds, a few basilar crackles, no wheezing Cardiac exam with regular rate and rhythm Lower extremities without significant edema, no cyanosis Alert and oriented, moves all 4 extremities.       Assessment & Plan:

## 2014-03-29 ENCOUNTER — Telehealth: Payer: Self-pay | Admitting: Pulmonary Disease

## 2014-03-29 NOTE — Telephone Encounter (Signed)
Called pt. He had his questions answered and nothing further needed

## 2014-04-05 ENCOUNTER — Telehealth: Payer: Self-pay | Admitting: Family Medicine

## 2014-04-05 NOTE — Telephone Encounter (Signed)
Caller name: Fabrizzio, Marcella Relation to pt: self  Call back number: [phone] Pharmacy: [id number] [place], [place] [phone] (Phone) [phone] (Fax)         Reason for call:  Pt states he is taking to many aspirin-acetaminophen-caffeine ([place]) 250-250-65 MG per tablet. Requesting another RX

## 2014-04-06 NOTE — Telephone Encounter (Signed)
Last OV: 11/02/13  Please advise.

## 2014-04-07 NOTE — Telephone Encounter (Signed)
Please call pt and verify how much Excedrin Migraine, plain tylenol, and pain meds he is taking.  All of these medications have acetaminophen (tylenol) in them and he has had issues w/ too much tylenol use in the past.

## 2014-04-07 NOTE — Telephone Encounter (Signed)
Excedrin Migraine 250-250-65 MG per tablet- takes 6 tablets/day   Tylenol-325 MG tablet- only takes occasionally/not taking every day; unable to give me an estimate---stating that he had come off taking it   Hydrocodone-  10-325 MG per tablet- takes 1 tablet every 6 hours.

## 2014-04-07 NOTE — Telephone Encounter (Signed)
I will discuss this w/ them at their appt time but they saw Neuro for his tremor and not his HAs.  It is likely worthwhile to re-visit this issue.

## 2014-04-07 NOTE — Telephone Encounter (Signed)
Excedrin migraine is not to exceed 2 tabs in 24 hrs.  This is WAY too much.  If he is having that much headache, he needs to see his neurologist.  His tylenol level is again close to 3 grams/day- which is too much.

## 2014-04-07 NOTE — Telephone Encounter (Signed)
Pt was asleep at the time call.  Provider's note discussed with wife.  She stated that he was aware that he was probably taking too much, which is why he called.  She's asking is there anything else he can take? She says they have seen a neurologist before and nothing was done.  An appointment was scheduled with Dr. [name] for 04/12/14 @ 2:00 pm for headaches and boil.   She said she will call back if appointment needs to be cancelled.

## 2014-04-07 NOTE — Telephone Encounter (Signed)
Wife informed.  Appointment confirmed.  She agreed to Neurology referral.  They would like see to Dr. Carles Collet.

## 2014-04-12 ENCOUNTER — Ambulatory Visit (INDEPENDENT_AMBULATORY_CARE_PROVIDER_SITE_OTHER): Payer: Medicare Other | Admitting: Family Medicine

## 2014-04-12 ENCOUNTER — Ambulatory Visit: Payer: Medicare Other | Admitting: Pulmonary Disease

## 2014-04-12 ENCOUNTER — Encounter: Payer: Self-pay | Admitting: Family Medicine

## 2014-04-12 VITALS — BP 130/86 | HR 94 | Temp 97.9°F | Resp 16 | Wt 166.0 lb

## 2014-04-12 DIAGNOSIS — L0231 Cutaneous abscess of buttock: Secondary | ICD-10-CM

## 2014-04-12 DIAGNOSIS — F1910 Other psychoactive substance abuse, uncomplicated: Secondary | ICD-10-CM

## 2014-04-12 DIAGNOSIS — R51 Headache: Secondary | ICD-10-CM

## 2014-04-12 DIAGNOSIS — R519 Headache, unspecified: Secondary | ICD-10-CM

## 2014-04-12 DIAGNOSIS — F558 Abuse of other non-psychoactive substances: Secondary | ICD-10-CM

## 2014-04-12 MED ORDER — CETIRIZINE HCL 10 MG PO TABS: 10.0000 mg | ORAL_TABLET | Freq: Every day | ORAL | Status: DC

## 2014-04-12 MED ORDER — TIZANIDINE HCL 4 MG PO TABS: 4.0000 mg | ORAL_TABLET | Freq: Three times a day (TID) | ORAL | Status: DC | PRN

## 2014-04-12 MED ORDER — DOXYCYCLINE HYCLATE 100 MG PO TABS: 100.0000 mg | ORAL_TABLET | Freq: Two times a day (BID) | ORAL | Status: DC

## 2014-04-12 NOTE — Progress Notes (Signed)
Pre visit review using our clinic review tool, if applicable. No additional management support is needed unless otherwise documented below in the visit note. 

## 2014-04-12 NOTE — Assessment & Plan Note (Signed)
Recurrent issue for pt.  Pt is taking daily tylenol, Norco, and up to 6 Excedrin Migraine daily.  Stressed that this is too much for his liver and that he is not to take more than 2 excedrin migraine daily.  Will follow.

## 2014-04-12 NOTE — Progress Notes (Signed)
   Subjective:    Patient ID: Malik Zuniga, male    DOB: Jul 08, 1945, 69 y.o.   MRN: [id number]  HPI HA- 'persistent but predictable'.  Pt feels HAs are mostly tension and sinus.  Pt is taking up to 6 Excedrin Migraine tabs daily.  Since this med contains ASA, he has been having issues w/ bruising.  Pt is also taking too much tylenol.  Pt still has flexeril  Boil- R buttock, has been present for 2-4 weeks.  Ruptured yesterday.  No fevers, chills.   Review of Systems For ROS see HPI     Objective:   Physical Exam  Constitutional: He is oriented to person, place, and time.  Frail, sitting in wheelchair  HENT:  Head: Normocephalic and atraumatic.  Eyes: Conjunctivae and EOM are normal. Pupils are equal, round, and reactive to light.  Musculoskeletal: He exhibits tenderness (over R buttock abscess). He exhibits no edema.  Neurological: He is alert and oriented to person, place, and time.  Skin: Skin is warm and dry. There is erythema (surrounding R buttock abscess w/ central drainage.  copious amount of pus expressed manually, no fluctuance remaining).  Psychiatric: He has a normal mood and affect. His behavior is normal. Thought content normal.  Vitals reviewed.         Assessment & Plan:

## 2014-04-12 NOTE — Assessment & Plan Note (Signed)
New.  Area drained copious pus today in office.  Dressed w/ abx ointment and gauze.  Start doxy.  Referral for [facility] wound care entered.  Reviewed supportive care and red flags that should prompt return.  Pt expressed understanding and is in agreement w/ plan.

## 2014-04-12 NOTE — Assessment & Plan Note (Signed)
Deteriorated.  Since pt's HAs are mostly tension and sinus, will start scheduled Zanaflex for his back and neck spasm due to his chronic back pain.  Start scheduled Zyrtec for allergy component.  Reviewed supportive care and red flags that should prompt return. Pt expressed understanding and is in agreement w/ plan.

## 2014-04-12 NOTE — Patient Instructions (Signed)
Follow up as needed Start the Doxy twice daily- w/ food- for the abscess We will have Home Health come out to assist w/ wound care STOP the Excedrin migraine (or at least limit to 2 tab daily) Start the Zanaflex for muscle spasm Start the Zyrtec daily in addition to the Singulair for the allergy component Increase your water intake to prevent headaches Call with any questions or concerns Hang in there!!

## 2014-04-15 DIAGNOSIS — I2510 Atherosclerotic heart disease of native coronary artery without angina pectoris: Secondary | ICD-10-CM | POA: Diagnosis not present

## 2014-04-15 DIAGNOSIS — Z87891 Personal history of nicotine dependence: Secondary | ICD-10-CM | POA: Diagnosis not present

## 2014-04-15 DIAGNOSIS — R51 Headache: Secondary | ICD-10-CM | POA: Diagnosis not present

## 2014-04-15 DIAGNOSIS — Z9981 Dependence on supplemental oxygen: Secondary | ICD-10-CM | POA: Diagnosis not present

## 2014-04-15 DIAGNOSIS — B9562 Methicillin resistant Staphylococcus aureus infection as the cause of diseases classified elsewhere: Secondary | ICD-10-CM | POA: Diagnosis not present

## 2014-04-15 DIAGNOSIS — L0231 Cutaneous abscess of buttock: Secondary | ICD-10-CM | POA: Diagnosis not present

## 2014-04-15 DIAGNOSIS — M549 Dorsalgia, unspecified: Secondary | ICD-10-CM | POA: Diagnosis not present

## 2014-04-15 DIAGNOSIS — Z9181 History of falling: Secondary | ICD-10-CM | POA: Diagnosis not present

## 2014-04-15 DIAGNOSIS — J449 Chronic obstructive pulmonary disease, unspecified: Secondary | ICD-10-CM | POA: Diagnosis not present

## 2014-04-15 DIAGNOSIS — I10 Essential (primary) hypertension: Secondary | ICD-10-CM | POA: Diagnosis not present

## 2014-04-17 DIAGNOSIS — I10 Essential (primary) hypertension: Secondary | ICD-10-CM | POA: Diagnosis not present

## 2014-04-17 DIAGNOSIS — J449 Chronic obstructive pulmonary disease, unspecified: Secondary | ICD-10-CM | POA: Diagnosis not present

## 2014-04-17 DIAGNOSIS — I2510 Atherosclerotic heart disease of native coronary artery without angina pectoris: Secondary | ICD-10-CM | POA: Diagnosis not present

## 2014-04-17 DIAGNOSIS — B9562 Methicillin resistant Staphylococcus aureus infection as the cause of diseases classified elsewhere: Secondary | ICD-10-CM | POA: Diagnosis not present

## 2014-04-17 DIAGNOSIS — M549 Dorsalgia, unspecified: Secondary | ICD-10-CM | POA: Diagnosis not present

## 2014-04-17 DIAGNOSIS — L0231 Cutaneous abscess of buttock: Secondary | ICD-10-CM | POA: Diagnosis not present

## 2014-04-19 ENCOUNTER — Telehealth: Payer: Self-pay | Admitting: *Deleted

## 2014-04-19 DIAGNOSIS — S22000D Wedge compression fracture of unspecified thoracic vertebra, subsequent encounter for fracture with routine healing: Secondary | ICD-10-CM | POA: Diagnosis not present

## 2014-04-19 NOTE — Telephone Encounter (Signed)
Received home health certification and plan of care via fax from [facility]. Forwarded to Dr. [name]. JG//CMA

## 2014-04-20 DIAGNOSIS — B9562 Methicillin resistant Staphylococcus aureus infection as the cause of diseases classified elsewhere: Secondary | ICD-10-CM | POA: Diagnosis not present

## 2014-04-20 DIAGNOSIS — J449 Chronic obstructive pulmonary disease, unspecified: Secondary | ICD-10-CM | POA: Diagnosis not present

## 2014-04-20 DIAGNOSIS — I2510 Atherosclerotic heart disease of native coronary artery without angina pectoris: Secondary | ICD-10-CM | POA: Diagnosis not present

## 2014-04-20 DIAGNOSIS — I10 Essential (primary) hypertension: Secondary | ICD-10-CM | POA: Diagnosis not present

## 2014-04-20 DIAGNOSIS — L0231 Cutaneous abscess of buttock: Secondary | ICD-10-CM | POA: Diagnosis not present

## 2014-04-20 DIAGNOSIS — M549 Dorsalgia, unspecified: Secondary | ICD-10-CM | POA: Diagnosis not present

## 2014-04-21 DIAGNOSIS — L0231 Cutaneous abscess of buttock: Secondary | ICD-10-CM | POA: Diagnosis not present

## 2014-04-24 DIAGNOSIS — F331 Major depressive disorder, recurrent, moderate: Secondary | ICD-10-CM | POA: Diagnosis not present

## 2014-04-28 ENCOUNTER — Other Ambulatory Visit: Payer: Self-pay | Admitting: General Practice

## 2014-04-28 MED ORDER — METOPROLOL SUCCINATE ER 50 MG PO TB24: 50.0000 mg | ORAL_TABLET | Freq: Every morning | ORAL | Status: DC

## 2014-05-01 ENCOUNTER — Other Ambulatory Visit: Payer: Self-pay | Admitting: General Practice

## 2014-05-01 MED ORDER — FLUTICASONE PROPIONATE 50 MCG/ACT NA SUSP: 2.0000 | Freq: Every day | NASAL | Status: DC

## 2014-05-01 MED ORDER — MONTELUKAST SODIUM 10 MG PO TABS: 10.0000 mg | ORAL_TABLET | Freq: Every day | ORAL | Status: DC

## 2014-05-03 ENCOUNTER — Ambulatory Visit: Payer: Medicare Other | Admitting: Family Medicine

## 2014-05-04 DIAGNOSIS — F331 Major depressive disorder, recurrent, moderate: Secondary | ICD-10-CM | POA: Diagnosis not present

## 2014-05-08 ENCOUNTER — Ambulatory Visit: Payer: Medicare Other | Admitting: Family Medicine

## 2014-05-10 ENCOUNTER — Encounter: Payer: Self-pay | Admitting: Family Medicine

## 2014-05-10 ENCOUNTER — Ambulatory Visit (INDEPENDENT_AMBULATORY_CARE_PROVIDER_SITE_OTHER): Payer: Medicare Other | Admitting: Family Medicine

## 2014-05-10 ENCOUNTER — Other Ambulatory Visit: Payer: Self-pay | Admitting: General Practice

## 2014-05-10 VITALS — BP 130/80 | HR 95 | Temp 98.0°F | Resp 16

## 2014-05-10 DIAGNOSIS — I10 Essential (primary) hypertension: Secondary | ICD-10-CM

## 2014-05-10 DIAGNOSIS — E785 Hyperlipidemia, unspecified: Secondary | ICD-10-CM | POA: Diagnosis not present

## 2014-05-10 DIAGNOSIS — L89011 Pressure ulcer of right elbow, stage 1: Secondary | ICD-10-CM | POA: Diagnosis not present

## 2014-05-10 DIAGNOSIS — L89009 Pressure ulcer of unspecified elbow, unspecified stage: Secondary | ICD-10-CM | POA: Insufficient documentation

## 2014-05-10 LAB — LIPID PANEL
CHOL/HDL RATIO: 3
CHOLESTEROL: 226 mg/dL — AB (ref 0–200)
HDL: 87 mg/dL (ref >39.00)
LDL Cholesterol: 124 mg/dL — ABNORMAL HIGH (ref 0–99)
NonHDL: 139
Triglycerides: 75 mg/dL (ref 0.0–149.0)
VLDL: 15 mg/dL (ref 0.0–40.0)

## 2014-05-10 LAB — HEPATIC FUNCTION PANEL
ALT: 11 U/L (ref 0–53)
AST: 17 U/L (ref 0–37)
Albumin: 3.9 g/dL (ref 3.5–5.2)
Alkaline Phosphatase: 56 U/L (ref 39–117)
BILIRUBIN DIRECT: 0.1 mg/dL (ref 0.0–0.3)
TOTAL PROTEIN: 7.4 g/dL (ref 6.0–8.3)
Total Bilirubin: 0.2 mg/dL (ref 0.2–1.2)

## 2014-05-10 LAB — BASIC METABOLIC PANEL
BUN: 11 mg/dL (ref 6–23)
CHLORIDE: 91 meq/L — AB (ref 96–112)
CO2: 31 meq/L (ref 19–32)
Calcium: 9.7 mg/dL (ref 8.4–10.5)
Creatinine, Ser: 0.73 mg/dL (ref 0.40–1.50)
GFR: 113.26 mL/min (ref >60.00)
Glucose, Bld: 98 mg/dL (ref 70–99)
Potassium: 4.6 mEq/L (ref 3.5–5.1)
Sodium: 126 mEq/L — ABNORMAL LOW (ref 135–145)

## 2014-05-10 MED ORDER — MUPIROCIN 2 % EX OINT: 1.0000 "application " | TOPICAL_OINTMENT | Freq: Two times a day (BID) | CUTANEOUS | Status: DC

## 2014-05-10 MED ORDER — SIMVASTATIN 40 MG PO TABS: 40.0000 mg | ORAL_TABLET | Freq: Every evening | ORAL | Status: DC

## 2014-05-10 NOTE — Assessment & Plan Note (Signed)
Chronic problem.  Well controlled.  Asymptomatic.  Check labs.  No anticipated med changes. 

## 2014-05-10 NOTE — Patient Instructions (Signed)
Schedule your complete physical in 6 months We'll notify you of your lab results and make any changes if needed Apply the Mupirocin ointment twice daily to the elbow Call with any questions or concerns [place]!!!

## 2014-05-10 NOTE — Assessment & Plan Note (Signed)
Chronic problem.  Tolerating statin w/o difficulty.  Check labs.  Adjust meds prn  

## 2014-05-10 NOTE — Progress Notes (Signed)
Pre visit review using our clinic review tool, if applicable. No additional management support is needed unless otherwise documented below in the visit note. 

## 2014-05-10 NOTE — Assessment & Plan Note (Signed)
New.  Stage 1 redness of L elbow w/ TTP.  Suspect this is due from pt sitting in 1 position in his recliner.  Encouraged him to offload the pressure on that spot.  Script sent for mupirocin ointment.  Reviewed supportive care and red flags that should prompt return.  Pt expressed understanding and is in agreement w/ plan.

## 2014-05-10 NOTE — Progress Notes (Signed)
   Subjective:    Patient ID: Malik Zuniga, male    DOB: 20-Dec-1945, 69 y.o.   MRN: [id number]  HPI HTN- chronic problem, on Losartan HCTZ.  Well controlled.  No CP, SOB, HAs above baseline, visual changes, edema.  Hyperlipidemia- chronic problem, on Simvastatin.  No abd pain, N/V.  L elbow sore- has small area of redness w/ crusing.  Pt has hx of similar from sitting in leather recliner.  Very painful per pt report.   Review of Systems For ROS see HPI     Objective:   Physical Exam  Constitutional: He is oriented to person, place, and time. He appears well-developed and well-nourished. No distress.  HENT:  Head: Normocephalic and atraumatic.  Eyes: Conjunctivae and EOM are normal. Pupils are equal, round, and reactive to light.  Neck: Normal range of motion. Neck supple. No thyromegaly present.  Cardiovascular: Normal rate, regular rhythm, normal heart sounds and intact distal pulses.   No murmur heard. Pulmonary/Chest: Effort normal and breath sounds normal. No respiratory distress.  Abdominal: Soft. Bowel sounds are normal. He exhibits no distension.  Musculoskeletal: He exhibits no edema.  Lymphadenopathy:    He has no cervical adenopathy.  Neurological: He is alert and oriented to person, place, and time. No cranial nerve deficit.  Skin: Skin is warm and dry. There is erythema (mild erythema w/ some crusting over L elbow, + TTP).  Psychiatric: He has a normal mood and affect. His behavior is normal.  Vitals reviewed.         Assessment & Plan:

## 2014-05-11 ENCOUNTER — Other Ambulatory Visit: Payer: Self-pay | Admitting: Family Medicine

## 2014-05-11 DIAGNOSIS — E871 Hypo-osmolality and hyponatremia: Secondary | ICD-10-CM

## 2014-05-19 ENCOUNTER — Other Ambulatory Visit (INDEPENDENT_AMBULATORY_CARE_PROVIDER_SITE_OTHER): Payer: Medicare Other

## 2014-05-19 DIAGNOSIS — R1013 Epigastric pain: Secondary | ICD-10-CM

## 2014-05-19 DIAGNOSIS — E871 Hypo-osmolality and hyponatremia: Secondary | ICD-10-CM | POA: Diagnosis not present

## 2014-05-19 DIAGNOSIS — D72829 Elevated white blood cell count, unspecified: Secondary | ICD-10-CM

## 2014-05-19 LAB — BASIC METABOLIC PANEL
BUN: 17 mg/dL (ref 6–23)
CALCIUM: 9.9 mg/dL (ref 8.4–10.5)
CO2: 27 mEq/L (ref 19–32)
Chloride: 96 mEq/L (ref 96–112)
Creatinine, Ser: 0.83 mg/dL (ref 0.40–1.50)
GFR: 97.66 mL/min (ref >60.00)
Glucose, Bld: 97 mg/dL (ref 70–99)
Potassium: 5.6 mEq/L — ABNORMAL HIGH (ref 3.5–5.1)
SODIUM: 132 meq/L — AB (ref 135–145)

## 2014-05-22 ENCOUNTER — Other Ambulatory Visit: Payer: Self-pay | Admitting: Family Medicine

## 2014-05-22 DIAGNOSIS — E875 Hyperkalemia: Secondary | ICD-10-CM

## 2014-05-31 ENCOUNTER — Other Ambulatory Visit (INDEPENDENT_AMBULATORY_CARE_PROVIDER_SITE_OTHER): Payer: Medicare Other

## 2014-05-31 DIAGNOSIS — E875 Hyperkalemia: Secondary | ICD-10-CM

## 2014-05-31 LAB — BASIC METABOLIC PANEL
BUN: 26 mg/dL — AB (ref 6–23)
CO2: 25 mEq/L (ref 19–32)
CREATININE: 0.88 mg/dL (ref 0.40–1.50)
Calcium: 9.6 mg/dL (ref 8.4–10.5)
Chloride: 97 mEq/L (ref 96–112)
GFR: 91.28 mL/min (ref >60.00)
Glucose, Bld: 96 mg/dL (ref 70–99)
Potassium: 4.4 mEq/L (ref 3.5–5.1)
Sodium: 130 mEq/L — ABNORMAL LOW (ref 135–145)

## 2014-06-22 ENCOUNTER — Other Ambulatory Visit: Payer: Self-pay | Admitting: Family Medicine

## 2014-06-22 NOTE — Telephone Encounter (Signed)
Med filled.  

## 2014-06-26 ENCOUNTER — Ambulatory Visit (INDEPENDENT_AMBULATORY_CARE_PROVIDER_SITE_OTHER): Payer: Medicare Other | Admitting: Pulmonary Disease

## 2014-06-26 ENCOUNTER — Encounter: Payer: Self-pay | Admitting: Pulmonary Disease

## 2014-06-26 ENCOUNTER — Ambulatory Visit (INDEPENDENT_AMBULATORY_CARE_PROVIDER_SITE_OTHER)
Admission: RE | Admit: 2014-06-26 | Discharge: 2014-06-26 | Disposition: A | Discharge status: Home / Self Care | Payer: Medicare Other | Source: Ambulatory Visit | Attending: Pulmonary Disease | Admitting: Pulmonary Disease

## 2014-06-26 VITALS — BP 112/72 | HR 85 | Temp 97.6°F | Ht 70.0 in | Wt 160.0 lb

## 2014-06-26 DIAGNOSIS — J690 Pneumonitis due to inhalation of food and vomit: Secondary | ICD-10-CM

## 2014-06-26 DIAGNOSIS — J438 Other emphysema: Secondary | ICD-10-CM | POA: Diagnosis not present

## 2014-06-26 DIAGNOSIS — J929 Pleural plaque without asbestos: Secondary | ICD-10-CM | POA: Diagnosis not present

## 2014-06-26 MED ORDER — AMOXICILLIN-POT CLAVULANATE 875-125 MG PO TABS: 1.0000 | ORAL_TABLET | Freq: Two times a day (BID) | ORAL | Status: DC

## 2014-06-26 NOTE — Progress Notes (Signed)
   Subjective:    Patient ID: Malik Zuniga, male    DOB: [birth year], 69 y.o.   MRN: [id number]  HPI The patient comes in today for an acute sick visit. He has known COPD, and also recurrent aspiration secondary to his neurologic condition. He was doing fairly well on his bronchodilator regimen, but tells me he may have had to aspiration episodes over the last one month. He does not feel that he aspirated a pill into his lungs, although they did occur while taking pills. He now has increasing chest congestion, increased cough with purulent mucus, but no fever. He has some increased shortness of breath, but not excessive.   Review of Systems  Constitutional: Negative for fever and unexpected weight change.  HENT: Positive for congestion and postnasal drip. Negative for dental problem, ear pain, nosebleeds, rhinorrhea, sneezing, sore throat and trouble swallowing.   Eyes: Negative for redness and itching.  Respiratory: Positive for cough and wheezing. Negative for chest tightness and shortness of breath.   Cardiovascular: Negative for palpitations and leg swelling.  Gastrointestinal: Negative for nausea and vomiting.  Genitourinary: Negative for dysuria.  Musculoskeletal: Negative for joint swelling.  Skin: Negative for rash.  Neurological: Negative for headaches.  Hematological: Does not bruise/bleed easily.  Psychiatric/Behavioral: Negative for dysphoric mood. The patient is not nervous/anxious.        Objective:   Physical Exam Frail-appearing male in no acute distress Nose without purulence or discharge noted Neck without lymphadenopathy or thyromegaly Chest with diffuse rhonchi, a few scattered crackles Cardiac exam with regular rate and rhythm Lower extremities with minimal edema, no cyanosis Alert and oriented, moves all 4 extremities.       Assessment & Plan:

## 2014-06-26 NOTE — Assessment & Plan Note (Signed)
The patient continues on his bronchodilator regimen, and does not feel that he has had a recent acute exacerbation.

## 2014-06-26 NOTE — Assessment & Plan Note (Signed)
The patient is having increased cough, purulent mucus, and some increased shortness of breath. He relates 2 episodes in the last month where he has choked on some pills, but does not feel that he has aspirated these down into his lung. I suspect he is developing another aspiration pneumonia, and we'll therefore treat him with a course of antibiotics. Will also check a chest x-ray today to make sure that he does not have any atelectatic segments to suggest foreign body aspiration.

## 2014-06-26 NOTE — Patient Instructions (Signed)
Will treat with augmentin '875mg'$  one am and pm for 7 days Will check chest xray today to make sure nothing to suggest you aspirated a pill into your lungs No change in breathing medications. Will make sure your upcoming appt is with Dr. [name].

## 2014-06-28 DIAGNOSIS — D649 Anemia, unspecified: Secondary | ICD-10-CM | POA: Diagnosis not present

## 2014-06-28 DIAGNOSIS — R1310 Dysphagia, unspecified: Secondary | ICD-10-CM | POA: Diagnosis not present

## 2014-06-28 DIAGNOSIS — K5900 Constipation, unspecified: Secondary | ICD-10-CM | POA: Diagnosis not present

## 2014-07-03 ENCOUNTER — Telehealth: Payer: Self-pay | Admitting: Pulmonary Disease

## 2014-07-03 NOTE — Telephone Encounter (Signed)
Let them know that he is on  At least 2 different BP meds, and this is the most likely cause of his feeling bad and having low readings.  If he does not eat or drink enough, and gets dehydrated, his BP will go down.  I suspect he does not still have pneumonia, but may just be having issues getting the mucus/congestion cleared out.  Would suggest:   1) please try and see someone in your primary office to get BP checked and make decisions about your BP meds. May want to hold if bp low until you see someone  2) try mucinex DM one am and pm, and try to push hydration.  If he feels his breathing status is getting worse, will need to come in and be seen.

## 2014-07-03 NOTE — Telephone Encounter (Signed)
Spoke with pt's wife, she is aware of the below recs.  Nothing further needed.

## 2014-07-03 NOTE — Telephone Encounter (Signed)
Spoke with pt's wife, states pt is not feeling completely better.  States yesterday his BP dropped low and got dizzy, which normally happens before he gets pneumonia per wife.   Took last tablet of augmentin today.  Pt does not have a prod cough, chest tightness, or fever, but still feels like he has chest congestion that he cannot cough up.   [facility] [address]   [place] please advise on recs.  Thanks!

## 2014-07-04 ENCOUNTER — Telehealth: Payer: Self-pay | Admitting: Interventional Cardiology

## 2014-07-04 NOTE — Telephone Encounter (Signed)
New Message   Patient would like to speak to nurse in regards to an issue that he is having. He said that his BP dropped low on Sunday morning (98/58 rounded number ).

## 2014-07-04 NOTE — Telephone Encounter (Signed)
Returned pt call. Pt sts that his bp bottomed out on Sun.his bp reading was 98/58. He denies chest pain,sob, palpitations, swelling, fever,cough. Adv pt sts that he has not been feeling well lately, and has not taken in much fluids. He sts that he drinks around 16-18oz daily. Adv pt to liberalize his fluid intake. F/u with his pcp.This seems appropriate since pt has not seen Dr.Smith in almost 1 yr.  Dr.Tabori manages pt hypertension. Pt agreeable and verbalized understanding.

## 2014-07-05 DIAGNOSIS — F331 Major depressive disorder, recurrent, moderate: Secondary | ICD-10-CM | POA: Diagnosis not present

## 2014-07-28 ENCOUNTER — Ambulatory Visit: Payer: BLUE CROSS/BLUE SHIELD | Admitting: Interventional Cardiology

## 2014-08-03 ENCOUNTER — Other Ambulatory Visit: Payer: Self-pay | Admitting: Interventional Cardiology

## 2014-08-04 ENCOUNTER — Other Ambulatory Visit: Payer: Self-pay

## 2014-08-04 MED ORDER — LOSARTAN POTASSIUM-HCTZ 50-12.5 MG PO TABS: 1.0000 | ORAL_TABLET | Freq: Every day | ORAL | Status: DC

## 2014-08-04 NOTE — Patient Outreach (Signed)
[place] [facility]) Care Management  08/04/2014  Malik Zuniga 25-Jun-1945 [id number]   Referral per High Risk List, assigned Maury Dus, RN to outreach.  [name]. [place], [place] Management [place] Assistant Phone: [phone] Fax: [phone]

## 2014-08-09 ENCOUNTER — Other Ambulatory Visit: Payer: Self-pay | Admitting: Interventional Cardiology

## 2014-08-10 ENCOUNTER — Other Ambulatory Visit: Payer: Self-pay | Admitting: Interventional Cardiology

## 2014-08-18 ENCOUNTER — Other Ambulatory Visit: Payer: Self-pay

## 2014-08-18 DIAGNOSIS — R1310 Dysphagia, unspecified: Secondary | ICD-10-CM

## 2014-08-18 DIAGNOSIS — I639 Cerebral infarction, unspecified: Secondary | ICD-10-CM

## 2014-08-18 DIAGNOSIS — J441 Chronic obstructive pulmonary disease with (acute) exacerbation: Secondary | ICD-10-CM

## 2014-08-18 NOTE — Patient Outreach (Signed)
[name]) Care Management  08/18/2014  Malik Zuniga [birth year] [id number]    RN CM spoke with patient's wife, Malik Zuniga about the services of [facility]. Patient was unable to talk because he was in the middle of taking his nebulizer treatment.  Patient and his wife are agreeable to services and wishes to schedule an appointment.  Patient's wife states she handles his medical affairs.  Wife reports that several of patients admission have been related to pneumonia.  Wife did not feel patient needed a Designer, television/film set for home visits.  She felt that having patient talk with a Health Coach monthly would be of more benefit.  RN CM will make a referral for Health Coach for disease management.    Maury Dus, RN, Ishmael Holter, [place] Telephonic Care Coordinator [phone]

## 2014-08-18 NOTE — Patient Outreach (Signed)
[place] [facility]) Care Management  08/18/2014  SALVADOR COUPE April 02, 1945 [id number]   Request from Maury Dus, RN to assign [place], assigned Johny Shock, RN.  [name]. [place], [place] Management [place] Assistant Phone: [phone] Fax: [phone]

## 2014-08-23 ENCOUNTER — Encounter: Payer: Self-pay | Admitting: *Deleted

## 2014-08-23 ENCOUNTER — Other Ambulatory Visit: Payer: Self-pay | Admitting: *Deleted

## 2014-08-23 NOTE — Patient Outreach (Signed)
[place] [facility]) Care Management  08/23/2014  INA POUPARD 05/31/1945 [id number]   Telephone call to patient for initial call. Patient reports he is doing fair. Patient reports he is on home oxygen 2l at night. He use a wheelchair mostly and has a cane that he uses very little. Patient placed wife on phone to complete information. Per wife the patient does not exercise any. Patient has had 8 back surgeries. He is a former smoker. He quit in 2015. Patient has not fallen within the last year. Patient weighs at the Dr office only. He has a workable scale at home. Patient does takes all his medication and keeps his Dr appointments. Per wife she thinks he was diagnosed with copd 4 years ago. Per wife [name] they are not aware of any  copd zones or action plans.   Assessment: Disease management for copd. RNCM discussed EMMI educational information with wife. Wife verbalized understanding. Wife excited about getting information to help her understand when to call the Dr. Hansel Feinstein and patient verbally agreed to [facility] follow up call within a month. Patient and wife unable to state any copd zones or action plans.  Plan: RNCM will send outreach packet on COPD Patient and Wife will be able to verbalize 3 symptoms of the yellow Zone Patient and Wife will be able to verbalize an action plan RNCM informed patient and wife about the 24 hr advice line RNCM  sent EMMI Using Home oxygen RNCM sent EMMI COPD what you can do RNCM sent initial barriers letter, assessment, and care plan to primary care physician  [place] Management [phone]

## 2014-08-24 ENCOUNTER — Encounter: Payer: Self-pay | Admitting: *Deleted

## 2014-08-30 ENCOUNTER — Other Ambulatory Visit: Payer: Self-pay | Admitting: Family Medicine

## 2014-08-30 DIAGNOSIS — S22000D Wedge compression fracture of unspecified thoracic vertebra, subsequent encounter for fracture with routine healing: Secondary | ICD-10-CM | POA: Diagnosis not present

## 2014-08-30 NOTE — Telephone Encounter (Signed)
Med filled.  

## 2014-09-01 ENCOUNTER — Encounter: Payer: Self-pay | Admitting: Nurse Practitioner

## 2014-09-01 ENCOUNTER — Ambulatory Visit (INDEPENDENT_AMBULATORY_CARE_PROVIDER_SITE_OTHER): Payer: Medicare Other | Admitting: Nurse Practitioner

## 2014-09-01 VITALS — BP 142/76 | HR 97 | Ht 70.0 in

## 2014-09-01 DIAGNOSIS — I639 Cerebral infarction, unspecified: Secondary | ICD-10-CM

## 2014-09-01 DIAGNOSIS — I5042 Chronic combined systolic (congestive) and diastolic (congestive) heart failure: Secondary | ICD-10-CM

## 2014-09-01 DIAGNOSIS — I2510 Atherosclerotic heart disease of native coronary artery without angina pectoris: Secondary | ICD-10-CM | POA: Diagnosis not present

## 2014-09-01 NOTE — Progress Notes (Signed)
CARDIOLOGY OFFICE NOTE  Date:  09/01/2014    Malik Zuniga Date of Birth: 1945-04-23 Medical Record #[id number]  PCP:  [name], MD  Cardiologist:  Tamala Julian    Chief Complaint  Patient presents with  . Coronary Artery Disease    Follow up visit - seen for Dr. Tamala Julian    History of Present Illness: Malik Zuniga is a 69 y.o. male who presents today for a routine check. Seen for Dr. Tamala Julian. He has CAD with past PCI, COPD, HTN and HLD.  Last seen in June of 2015 and felt to be stable.   Comes in today. Here with his wife. He says he is doing ok. Wife feels like he is "holding his own". No chest pain. Some shortness of breath but stable. Notes some elevation in his heart rate at times - nothing that makes him feels bad. Not dizzy or lightheaded. Pretty sedentary due to his neurologic disorder. Will be seeing Dr. Pennie Banter in light of Dr. [name] leaving. Labs are checked by PCP.  Past Medical History  Diagnosis Date  . Hyperlipidemia     takes Simvastatin daily  . Tremor   . History of prostate cancer   . On home O2   . Neuromuscular disorder 1998    right carpal tunnel release  . Anemia associated with acute blood loss   . Cancer 2004    prostate  . GERD (gastroesophageal reflux disease)     takes Omeprazole daily  . Hypertension     takes Metoprolol daily as well as Hyzaar  . Constipation     takes Colace daily as well as Miralax  . Anxiety     takes Valium daily  . Depression     takes Cymbalta daily  . Emphysema of lung     Albuterol as needed;Symbicort daily and Singulair at bedtime  . Emphysema   . Myocardial infarction 04/1998  . Coronary artery disease   . Asthma   . Shortness of breath     with exertion  . History of bronchitis   . Aspiration pneumonia 2010  . Headache, chronic daily   . History of migraine     last migraine a couple of days ago;takes Excedrin Migraine  . Chronic back pain     compression fracture  . History of colon polyps     . Mood change     after Brain surgery mood changed and was placed on Depakote  . Insomnia     takes Benadryl nightly  . 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 Cholecystectomy  1996  . 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 Annette Stable  . Vascular stent  2000    Dr. Rollene Fare; he reports cardiac stent, but denies stent for PAD 06/15/13  . Hand surgery  1989    crushed thumb, Dr. Fredna Dow  . Ulnar nerve repair  1998    left arm, Dr. Fredna Dow  . Gastrostomy w/ feeding tube  2008    Dr. Watt Climes; only had for a few months  . Prostatectomy  04/2001    removal of prostate cancer, Dr. Janice Norrie  . Coronary angioplasty with stent placement  04/1998  . Carpal tunnel release  1998    right  . Spine surgery    . 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 Back surgery  08/2004; 02/2005; 04/2006; 06/2007; 7/20101  all by Dr. Annette Stable  . Esophagogastroduodenoscopy N/A 07/23/2012    Procedure: ESOPHAGOGASTRODUODENOSCOPY (EGD);  Surgeon: Jeryl Columbia, MD;  Location: [facility] ENDOSCOPY;  Service: Endoscopy;  Laterality: N/A;  buccini /ja  . Colonoscopy    . Back surgery  05/2013     Medications: Current Outpatient Prescriptions  Medication Sig Dispense Refill  . ARIPiprazole (ABILIFY) 10 MG tablet Take 5 mg by mouth daily.     . Ascorbic Acid (VITAMIN C) 1000 MG tablet Take 1,000 mg by mouth daily.    Marland Kitchen aspirin-acetaminophen-caffeine (EXCEDRIN MIGRAINE) 250-250-65 MG per tablet Take 2 tablets by mouth every 6 (six) hours as needed. For headaches    . budesonide (PULMICORT) 0.25 MG/2ML nebulizer solution Take 4 mLs (0.5 mg total) by nebulization 2 (two) times daily. 60 mL 3  . cetirizine (ZYRTEC) 10 MG tablet Take 1 tablet (10 mg total) by mouth daily. 30 tablet 11  . cholecalciferol (VITAMIN D) 1000 UNITS tablet Take 1,000 Units by mouth every morning.     . cyclobenzaprine (FLEXERIL) 10 MG tablet Take 10 mg by mouth daily as needed for muscle spasms.     . diazepam (VALIUM) 5  MG tablet Take 5 mg by mouth 3 (three) times daily. scheduled    . divalproex (DEPAKOTE) 500 MG 24 hr tablet Take 1,000 mg by mouth at bedtime.     . docusate sodium (COLACE) 100 MG capsule Take 200 mg by mouth at bedtime.     . DULoxetine (CYMBALTA) 60 MG capsule Take 60 mg by mouth daily.    . fluticasone (FLONASE) 50 MCG/ACT nasal spray Place 2 sprays into both nostrils daily. 16 g 2  . HYDROcodone-acetaminophen (NORCO) 10-325 MG per tablet Take by mouth. for pain  0  . ipratropium-albuterol (DUONEB) 0.5-2.5 (3) MG/3ML SOLN Take 3 mLs by nebulization every 6 (six) hours. 360 mL 3  . losartan-hydrochlorothiazide (HYZAAR) 50-12.5 MG per tablet Take 1 tablet by mouth daily. 30 tablet 6  . metoprolol succinate (TOPROL-XL) 50 MG 24 hr tablet TAKE 1 TABLET (50 MG TOTAL) BY MOUTH EVERY MORNING. TAKE WITH OR IMMEDIATELY FOLLOWING A MEAL. 30 tablet 3  . montelukast (SINGULAIR) 10 MG tablet Take 1 tablet (10 mg total) by mouth at bedtime. 30 tablet 3  . mupirocin ointment (BACTROBAN) 2 % Apply 1 application topically 2 (two) times daily. 30 g 3  . NITROSTAT 0.4 MG SL tablet PLACE 1 TABLET (0.4 MG TOTAL) UNDER THE TONGUE EVERY 5 (FIVE) MINUTES AS NEEDED FOR CHEST PAIN. 25 tablet 1  . Omega-3 Fatty Acids (FISH OIL) 1000 MG CAPS Take 1 capsule by mouth every morning.     Marland Kitchen omeprazole (PRILOSEC) 40 MG capsule Take 40 mg by mouth 2 (two) times daily.     . polyethylene glycol (MIRALAX / GLYCOLAX) packet Take 17 g by mouth 2 (two) times daily. For constipation    . primidone (MYSOLINE) 250 MG tablet TAKE 1 TABLET (250 MG TOTAL) BY MOUTH 2 (TWO) TIMES DAILY. 60 tablet 6  . Simethicone (MYLANTA GAS PO) Take 30 mLs by mouth daily as needed (for indigestion).     . simvastatin (ZOCOR) 40 MG tablet Take 1 tablet (40 mg total) by mouth every evening. 90 tablet 1  . tiotropium (SPIRIVA) 18 MCG inhalation capsule Place 18 mcg into inhaler and inhale as needed.     Marland Kitchen tiZANidine (ZANAFLEX) 4 MG tablet TAKE 1 TABLET BY  MOUTH EVERY 8 HOURS AS NEEDED FOR MUSCLE SPASMS. 90 tablet 1  .  Zinc 50 MG TABS Take 50 mg by mouth daily.     . promethazine (PHENERGAN) 25 MG tablet Take 25 mg by mouth every 6 (six) hours as needed. for nausea  1   No current facility-administered medications for this visit.    Allergies: Allergies  Allergen Reactions  . Lisinopril Swelling    ANGIOEDEMA  . Lamictal [Lamotrigine] Other (See Comments)    Weakness/difficulty swallowing  . Ambien [Zolpidem] Other (See Comments)    Unknown reaction    Social History: The patient  reports that he quit smoking about 18 months ago. His smoking use included Cigarettes. He has a 25 pack-year smoking history. He has never used smokeless tobacco. He reports that he uses illicit drugs (Mescaline). He reports that he does not drink alcohol.   Family History: The patient's family history includes Cancer in his father; Heart disease in his mother.   Review of Systems: Please see the history of present illness.   Otherwise, the review of systems is positive for none.   All other systems are reviewed and negative.   Physical Exam: VS:  BP 142/76 mmHg  Pulse 97  Ht '5\' 10"'$  (1.778 m)  SpO2 91% .  BMI There is no weight on file to calculate BMI.  Wt Readings from Last 3 Encounters:  08/23/14 165 lb (74.844 kg)  06/26/14 160 lb (72.576 kg)  04/12/14 166 lb (75.297 kg)   HR is 88 by me.   General: Pleasant. He looks chronically ill but in no acute distress.  HEENT: Normal. Neck: Supple, no JVD, carotid bruits, or masses noted.  Cardiac: Regular rate and rhythm. No murmurs, rubs, or gallops. No edema. His heart rate is 88 by me.  Respiratory:  Lungs are clear to auscultation bilaterally with normal work of breathing.  GI: Soft and nontender.  MS: No deformity or atrophy. Gait not tested. ROM intact.Kyphotic. In a wheelchair.  Skin: Warm and dry. Color is normal.  Neuro:  Strength and sensation are intact and no gross focal deficits noted.    Psych: Alert, appropriate and with normal affect.   LABORATORY DATA:  EKG:  EKG is not ordered today.   Lab Results  Component Value Date   WBC 10.9* 11/02/2013   HGB 10.0* 11/02/2013   HCT 31.1* 11/02/2013   PLT 674.0* 11/02/2013   GLUCOSE 96 05/31/2014   CHOL 226* 05/10/2014   TRIG 75.0 05/10/2014   HDL 87.00 05/10/2014   LDLDIRECT 112.3 04/22/2012   LDLCALC 124* 05/10/2014   ALT 11 05/10/2014   AST 17 05/10/2014   NA 130* 05/31/2014   K 4.4 05/31/2014   CL 97 05/31/2014   CREATININE 0.88 05/31/2014   BUN 26* 05/31/2014   CO2 25 05/31/2014   TSH 1.840 10/23/2013   PSA 0.01* 08/23/2013   INR 1.09 07/23/2012    BNP (last 3 results) No results for input(s): BNP in the last 8760 hours.  ProBNP (last 3 results) No results for input(s): PROBNP in the last 8760 hours.   Other Studies Reviewed Today:   Assessment/Plan: 1. CAD - stable without symptoms. Stay on current regimen.   2. HLD - labs checked by PCP  3. COPD - followed by pulmonary  4. HTN - BP stable.   Current medicines are reviewed with the patient today.  The patient does not have concerns regarding medicines other than what has been noted above.  The following changes have been made:  See above.  Labs/ tests ordered today include:  No orders of the defined types were placed in this encounter.     Disposition:   FU with Dr. Tamala Julian in 4 months.   Patient is agreeable to this plan and will call if any problems develop in the interim.   Signed: Burtis Junes, RN, ANP-C 09/01/2014 8:51 AM  [facility] [address] [place], [place]  [address] Phone: [phone] Fax: [phone]

## 2014-09-01 NOTE — Patient Instructions (Signed)
We will be checking the following labs today - None    Medication Instructions:    Continue with your current medicines.     Testing/Procedures To Be Arranged:  N/A  Follow-Up:   See Dr. Tamala Julian in one year   Other Special Instructions:   N/A  Call the [place] office at [phone] if you have any questions, problems or concerns.

## 2014-09-02 ENCOUNTER — Other Ambulatory Visit: Payer: Self-pay | Admitting: Family Medicine

## 2014-09-04 NOTE — Telephone Encounter (Signed)
Metoprolol requested received and filled on 05/26/8307, then duplicate request received and denied. JG//CMA

## 2014-09-06 ENCOUNTER — Other Ambulatory Visit: Payer: Self-pay | Admitting: Family Medicine

## 2014-09-08 ENCOUNTER — Ambulatory Visit: Payer: Medicare Other | Admitting: Pulmonary Disease

## 2014-09-20 ENCOUNTER — Other Ambulatory Visit: Payer: Self-pay | Admitting: *Deleted

## 2014-09-20 ENCOUNTER — Ambulatory Visit: Payer: Self-pay | Admitting: *Deleted

## 2014-09-20 ENCOUNTER — Other Ambulatory Visit: Payer: Self-pay | Admitting: Family Medicine

## 2014-09-20 NOTE — Patient Outreach (Signed)
[place] [facility]) Care Management  09/20/2014  Malik Zuniga 16-Feb-1945 [id number]   RN CM attempted  Follow up outreach call to patient.  Patient was unavailable. No voicemail message receiver available for pickup.    [facility] [phone]

## 2014-09-20 NOTE — Telephone Encounter (Signed)
Medication filled to pharmacy as requested.   

## 2014-09-27 ENCOUNTER — Other Ambulatory Visit: Payer: Self-pay | Admitting: *Deleted

## 2014-09-27 ENCOUNTER — Encounter: Payer: Self-pay | Admitting: *Deleted

## 2014-09-27 NOTE — Patient Outreach (Addendum)
[facility] [facility]) Care Management  09/27/2014  Malik Zuniga 08/21/1945 [id number]   Telephone call to patient and wife. [facility] verified with patient and wife. Patient is in the green zone with his copd.  Per patient wife he has been having a lot of problem with his swallowing. He coughs a lot when eating. Per wife the Dr told him that the muscles are weak. Patient has a poor appetite because he is afraid he will choke. Discussed with the wife about making shakes and different foods that he can eat.  Patient is 5 feet 10 inches and weighs 150 pounds.  Per wife patient is also  having headaches a lot. Discussed with wife some foods that have the potential to increase headaches.  Patient and wife have agreed to follow up outreach call within month. Patient is on medication for high cholesterol but wife is not sure what all the results mean.  Assessment Knowledge deficit on food preparation for dysphagia diet Knowledge deficit on high cholesterol readings  PLAN: RNCM sent patient and wife EMMI information on High Cholesterol Levels RNCM sent patient and wife EMMI information on  Cholesterol levels and testing RNCM sent patient and wife EMMI information on  Preventing A second stroke: Know the signs of a stroke RNCM sent patient and wife information on Dysphagia RNCM sent patient and wife information on  Dysphagia Diet level 2 RNCM sent patient and wife information on  Dysphagia diet level 3 RNCM sent patient and wife information on  Thickening liquids for Dysphagia Diet Patient and wife will be able to verbalize foods to eaten on a dysphagia diet Patient and will be able to understand high cholesterol and the readings [place] Management [phone]

## 2014-10-18 ENCOUNTER — Ambulatory Visit (INDEPENDENT_AMBULATORY_CARE_PROVIDER_SITE_OTHER): Payer: Medicare Other | Admitting: Pulmonary Disease

## 2014-10-18 ENCOUNTER — Encounter: Payer: Self-pay | Admitting: Pulmonary Disease

## 2014-10-18 VITALS — BP 124/68 | HR 81

## 2014-10-18 DIAGNOSIS — J438 Other emphysema: Secondary | ICD-10-CM | POA: Diagnosis not present

## 2014-10-18 DIAGNOSIS — J690 Pneumonitis due to inhalation of food and vomit: Secondary | ICD-10-CM

## 2014-10-18 DIAGNOSIS — I639 Cerebral infarction, unspecified: Secondary | ICD-10-CM | POA: Diagnosis not present

## 2014-10-18 NOTE — Assessment & Plan Note (Signed)
He has not had an episode of pneumonia recently. That said, he has been experiencing some dysphagia. He says that he has an appointment planned with Dr. Watt Climes to talk about this more.  Plan: Continue follow-up with Dr. Watt Climes

## 2014-10-18 NOTE — Assessment & Plan Note (Signed)
This has been a stable interval for Malik Zuniga. He has done well with the regimen of nebulized budesonide with DuoNeb on a scheduled basis. He has not had an exacerbation since the last visit. His immunizations are up-to-date. Any dyspnea he may experience is likely more due to his profound deconditioning. That said he does have severe COPD but he does not have recurrent exacerbations.  Plan: Get a flu shot as soon as they become available Continue budesonide and DuoNeb Stay as active as possible Follow-up 6 months or sooner if needed

## 2014-10-18 NOTE — Progress Notes (Signed)
Subjective:    Patient ID: Malik Zuniga, male    DOB: 11/17/1945, 69 y.o.   MRN: [id number]  Synopsis: former patient of Dr. [name] with COPD and recurrent aspiration pneumonia Malik Zuniga 04/2009:  FEV1 1.65 (47%), FEV1% 49  CT chest 10/2013:  RLL infiltrates CXR 10/2013:  LLL infiltrate +h/o aspiration in the past.  +speech therapy evaluation with recs given.   HPI  Chief Complaint  Patient presents with  . Follow-up    former [facility] pt being treated for emphysema.  pt wears 02 qhs and uses nebs daily, no breathing complaints.     Mr Branden is here to see me for COPD and recurrent aspiratoin pneumonia. He smoked for many years 1.5ppd for 30 years and quit in 2014.  He worked in a Administrator for years.  He has never been hospitalized for COPD, but he has been hospitalized for aspiration pneumonia.  He doesn't feel like aspiration has been a problem but he has noticed trouble swallowing recently.  He says that he feels like lamictal is related to his muscle weakness and throat muscle weakness.  He has had several swallowing tests in the past at least three times.  He has not followed swallowing exercises lately.  He feels like food gets stuck in the upper part of his chest.  He has seen Dr. Watt Climes about this many times in the past.  He says that he is stable from a respiratory standpoint.  He is not active ata all.  He says that his muscle weakness is a problem more than breathing. He can walk a few steps but he can't get much further than that.  He has a long history of neuromuscular weakness, the worst is in the right leg. He has been told that a nerve going to his right leg is causing weakness and numbness.  He is able to travel with friends without too much difficulty. He has not been taking any inhaled therapies other than the nebulizers.  So he is currently taking budesonide twice a day and duoneb four times a day.  Past Medical History  Diagnosis Date  . Hyperlipidemia     takes  Simvastatin daily  . Tremor   . History of prostate cancer   . On home O2   . Neuromuscular disorder 1998    right carpal tunnel release  . Anemia associated with acute blood loss   . Cancer 2004    prostate  . GERD (gastroesophageal reflux disease)     takes Omeprazole daily  . Hypertension     takes Metoprolol daily as well as Hyzaar  . Constipation     takes Colace daily as well as Miralax  . Anxiety     takes Valium daily  . Depression     takes Cymbalta daily  . Emphysema of lung     Albuterol as needed;Symbicort daily and Singulair at bedtime  . Emphysema   . Myocardial infarction 04/1998  . Coronary artery disease   . Asthma   . Shortness of breath     with exertion  . History of bronchitis   . Aspiration pneumonia 2010  . Headache, chronic daily   . History of migraine     last migraine a couple of days ago;takes Excedrin Migraine  . Chronic back pain     compression fracture  . History of colon polyps   . Mood change     after Brain surgery mood changed and  was placed on Depakote  . Insomnia     takes Benadryl nightly  . 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  HENT: Negative for postnasal drip, rhinorrhea and sinus pressure.   Respiratory: Negative for cough, shortness of breath and wheezing.        Objective:   Physical Exam Filed Vitals:   10/18/14 1332  BP: 124/68  Pulse: 81  SpO2: 95%   RA  Gen: chronically ill appearing, in wheelchair HENT: OP clear, TM's clear, neck supple PULM: CTA B, normal percussion CV: RRR, no mgr, trace edema GI: BS+, soft, nontender Derm: no cyanosis or rash Psyche: normal mood and affect        Assessment & Plan:  COPD (chronic obstructive pulmonary disease) with emphysema This has been a stable interval for Malik Zuniga. He has done well with the regimen of nebulized budesonide with DuoNeb on a scheduled basis. He has not had an  exacerbation since the last visit. His immunizations are up-to-date. Any dyspnea he may experience is likely more due to his profound deconditioning. That said he does have severe COPD but he does not have recurrent exacerbations.  Plan: Get a flu shot as soon as they become available Continue budesonide and DuoNeb Stay as active as possible Follow-up 6 months or sooner if needed  Recurrent aspiration pneumonia He has not had an episode of pneumonia recently. That said, he has been experiencing some dysphagia. He says that he has an appointment planned with Dr. Watt Climes to talk about this more.  Plan: Continue follow-up with Dr. Watt Climes     Current outpatient prescriptions:  .  ARIPiprazole (ABILIFY) 10 MG tablet, Take 5 mg by mouth daily. , Disp: , Rfl:  .  Ascorbic Acid (VITAMIN C) 1000 MG tablet, Take 1,000 mg by mouth daily., Disp: , Rfl:  .  aspirin-acetaminophen-caffeine (EXCEDRIN MIGRAINE) 250-250-65 MG per tablet, Take 2 tablets by mouth every 6 (six) hours as needed. For headaches, Disp: , Rfl:  .  budesonide (PULMICORT) 0.25 MG/2ML nebulizer solution, Take 4 mLs (0.5 mg total) by nebulization 2 (two) times daily., Disp: 60 mL, Rfl: 3 .  cetirizine (ZYRTEC) 10 MG tablet, Take 1 tablet (10 mg total) by mouth daily., Disp: 30 tablet, Rfl: 11 .  cholecalciferol (VITAMIN D) 1000 UNITS tablet, Take 1,000 Units by mouth every morning. , Disp: , Rfl:  .  cyclobenzaprine (FLEXERIL) 10 MG tablet, Take 10 mg by mouth daily as needed for muscle spasms. , Disp: , Rfl:  .  diazepam (VALIUM) 5 MG tablet, Take 5 mg by mouth 3 (three) times daily. scheduled, Disp: , Rfl:  .  divalproex (DEPAKOTE) 500 MG 24 hr tablet, Take 1,000 mg by mouth at bedtime. , Disp: , Rfl:  .  docusate sodium (COLACE) 100 MG capsule, Take 200 mg by mouth at bedtime. , Disp: , Rfl:  .  DULoxetine (CYMBALTA) 60 MG capsule, Take 60 mg by mouth daily., Disp: , Rfl:  .  fluticasone (FLONASE) 50 MCG/ACT nasal spray, Place 2  sprays into both nostrils daily., Disp: 16 g, Rfl: 2 .  HYDROcodone-acetaminophen (NORCO) 10-325 MG per tablet, Take by mouth. for pain, Disp: , Rfl: 0 .  ipratropium-albuterol (DUONEB) 0.5-2.5 (3) MG/3ML SOLN, Take 3 mLs by nebulization every 6 (six) hours., Disp: 360 mL, Rfl: 3 .  losartan-hydrochlorothiazide (HYZAAR) 50-12.5 MG per tablet, Take 1 tablet by mouth daily.,  Disp: 30 tablet, Rfl: 6 .  metoprolol succinate (TOPROL-XL) 50 MG 24 hr tablet, TAKE 1 TABLET (50 MG TOTAL) BY MOUTH EVERY MORNING. TAKE WITH OR IMMEDIATELY FOLLOWING A MEAL., Disp: 30 tablet, Rfl: 3 .  montelukast (SINGULAIR) 10 MG tablet, Take 1 tablet (10 mg total) by mouth at bedtime., Disp: 30 tablet, Rfl: 3 .  mupirocin ointment (BACTROBAN) 2 %, Apply 1 application topically 2 (two) times daily., Disp: 30 g, Rfl: 3 .  NITROSTAT 0.4 MG SL tablet, PLACE 1 TABLET (0.4 MG TOTAL) UNDER THE TONGUE EVERY 5 (FIVE) MINUTES AS NEEDED FOR CHEST PAIN., Disp: 25 tablet, Rfl: 1 .  Omega-3 Fatty Acids (FISH OIL) 1000 MG CAPS, Take 1 capsule by mouth every morning. , Disp: , Rfl:  .  omeprazole (PRILOSEC) 40 MG capsule, Take 40 mg by mouth 2 (two) times daily. , Disp: , Rfl:  .  polyethylene glycol (MIRALAX / GLYCOLAX) packet, Take 17 g by mouth 2 (two) times daily. For constipation, Disp: , Rfl:  .  primidone (MYSOLINE) 250 MG tablet, TAKE 1 TABLET (250 MG TOTAL) BY MOUTH 2 (TWO) TIMES DAILY., Disp: 60 tablet, Rfl: 6 .  promethazine (PHENERGAN) 25 MG tablet, Take 25 mg by mouth every 6 (six) hours as needed. for nausea, Disp: , Rfl: 1 .  Simethicone (MYLANTA GAS PO), Take 30 mLs by mouth daily as needed (for indigestion). , Disp: , Rfl:  .  simvastatin (ZOCOR) 40 MG tablet, TAKE 1 TABLET (40 MG TOTAL) BY MOUTH EVERY EVENING., Disp: 90 tablet, Rfl: 1 .  tiotropium (SPIRIVA) 18 MCG inhalation capsule, Place 18 mcg into inhaler and inhale as needed. , Disp: , Rfl:  .  tiZANidine (ZANAFLEX) 4 MG tablet, TAKE 1 TABLET BY MOUTH EVERY 8 HOURS AS  NEEDED FOR MUSCLE SPASMS., Disp: 90 tablet, Rfl: 1 .  Zinc 50 MG TABS, Take 50 mg by mouth daily. , Disp: , Rfl:

## 2014-10-18 NOTE — Patient Instructions (Signed)
We will call you with the results of the lung cancer screening CT Keep taking your medications as you're doing Get a flu shot [place] and as they become available Stay as active as possible We will see you back in 6 months or sooner if needed

## 2014-10-29 ENCOUNTER — Other Ambulatory Visit: Payer: Self-pay | Admitting: Family Medicine

## 2014-10-30 ENCOUNTER — Telehealth: Payer: Self-pay | Admitting: Pulmonary Disease

## 2014-10-30 DIAGNOSIS — F331 Major depressive disorder, recurrent, moderate: Secondary | ICD-10-CM | POA: Diagnosis not present

## 2014-10-30 NOTE — Telephone Encounter (Signed)
Medication filled to pharmacy as requested.   

## 2014-10-30 NOTE — Telephone Encounter (Signed)
lmtcb x1 

## 2014-10-31 ENCOUNTER — Other Ambulatory Visit: Payer: Self-pay | Admitting: Acute Care

## 2014-10-31 DIAGNOSIS — Z87891 Personal history of nicotine dependence: Secondary | ICD-10-CM

## 2014-10-31 NOTE — Telephone Encounter (Signed)
I have scheduled this patient for a shared decision making visit and scan on 11/08/14. Thanks so much .   Judson Roch

## 2014-10-31 NOTE — Telephone Encounter (Signed)
Per 10/18/14 OV: Patient Instructions       We will call you with the results of the lung cancer screening CT Keep taking your medications as you're doing Get a flu shot [place] and as they become available Stay as active as possible We will see you back in 6 months or sooner if needed  --  Called spoke with pt. He reports he has not heard about having the lung cancer screening CT done.  Looked in chart and this was not ordered. Per Dr. [name] send over to Eric Form and she will take care of this. Also spoke with Judson Roch to make her aware. Please advise thanks

## 2014-11-03 ENCOUNTER — Other Ambulatory Visit: Payer: Self-pay | Admitting: *Deleted

## 2014-11-03 NOTE — Patient Outreach (Signed)
[facility]) Care Management  11/03/2014  Malik Zuniga [birth year] [id number]   RN CM attempted Telephone outreach call to patient.  Patient was unavailable. No voicemail available to leave a voice mail message. RN Health coach will make additional follow up call within a week.   [place] Care Management [phone]

## 2014-11-08 ENCOUNTER — Ambulatory Visit (INDEPENDENT_AMBULATORY_CARE_PROVIDER_SITE_OTHER)
Admission: RE | Admit: 2014-11-08 | Discharge: 2014-11-08 | Disposition: A | Discharge status: Home / Self Care | Payer: Medicare Other | Source: Ambulatory Visit | Attending: Acute Care | Admitting: Acute Care

## 2014-11-08 ENCOUNTER — Encounter: Payer: Self-pay | Admitting: Acute Care

## 2014-11-08 ENCOUNTER — Ambulatory Visit (INDEPENDENT_AMBULATORY_CARE_PROVIDER_SITE_OTHER): Payer: Medicare Other | Admitting: Acute Care

## 2014-11-08 DIAGNOSIS — Z87891 Personal history of nicotine dependence: Secondary | ICD-10-CM

## 2014-11-08 NOTE — Progress Notes (Signed)
Shared Decision Making Visit Lung Cancer Screening Program [phone])   Eligibility:  Age 69 y.o.  Pack Years Smoking History Calculation: 78 pack years (# packs/per year x # years smoked)  Recent History of coughing up blood  no  Unexplained weight loss? no ( >Than 15 pounds within the last 6 months )  Prior History Lung / other cancer : No (Diagnosis within the last 5 years already requiring surveillance chest CT Scans).  Smoking Status Former Smoker  Former Smokers: Years since quit: 2 years  Quit Date: 07/17/2012  Visit Components:  Discussion included one or more decision making aids. yes  Discussion included risk/benefits of screening. yes  Discussion included potential follow up diagnostic testing for abnormal scans. yes  Discussion included meaning and risk of over diagnosis. yes  Discussion included meaning and risk of False Positives. yes  Discussion included meaning of total radiation exposure. yes  Counseling Included:  Importance of adherence to annual lung cancer LDCT screening. yes  Impact of comorbidities on ability to participate in the program. yes  Ability and willingness to under diagnostic treatment. yes  Smoking Cessation Counseling:  Current Smokers: Discussed importance of smoking cessation. Former smoker  Information about tobacco cessation classes and interventions provided to patient.NA  Patient provided with "ticket" for LDCT Scan. NA  Symptomatic Patient. no  Counseling: NA  Diagnosis Code: Tobacco Use Z72.0  Asymptomatic Patient yes   Counseling NA  Discussed the importance of maintaining cigarette abstinence. yes  Diagnosis Code: Personal History of Nicotine Dependence. H47.425  Information about tobacco cessation classes and interventions provided to patient. Yes  Patient provided with "ticket" for LDCT Scan. yes  Written Order for Lung Cancer Screening with LDCT placed in Epic. Yes (CT Chest Lung Cancer Screening Low  Dose W/O CM) [id number] Z12.2-Screening of respiratory organs Z87.891-Personal history of nicotine dependence  I spent 15 minutes of face to face time with [name] and his wife discussing the risks and benefits of the Lung Cancer Screening program. We viewed a power point together, pausing and stopping at intervals to allow for questions to be asked and answered to ensure understanding of all of the above concepts. We discussed that quitting smoking was the most powerful action he could take to decrease his risks of developing lung cancer. He quit in 2014 and has no desire to smoke ever again. I told him if he ever needed help remaining smoke free to call me and I will make sure he has the necessary tools.We discussed that this is an annual scan, and that he would need to be willing and able to undergo diagnostic testing if necessary. We discussed the fact that he would have to lie on the scanner, which with his back issues may be a challenge, but he wants to try. I have called Stacey at the scanner to let her know he may need extra pillows for the scan. We discussed the time and location of the scan and both he and his wife are familiar with the location. I have provided both a copy of the power point we reviewed together, and my card and contact information in the event he or his wife have any questions or need to contact me in the future. Both verbalized understanding of all of the above, and had no further questions at the time they left my office.   Magdalen Spatz, NP

## 2014-11-09 ENCOUNTER — Other Ambulatory Visit: Payer: Self-pay | Admitting: Acute Care

## 2014-11-09 DIAGNOSIS — R911 Solitary pulmonary nodule: Secondary | ICD-10-CM

## 2014-11-10 ENCOUNTER — Ambulatory Visit: Payer: Self-pay | Admitting: *Deleted

## 2014-11-10 ENCOUNTER — Other Ambulatory Visit: Payer: Self-pay | Admitting: *Deleted

## 2014-11-10 NOTE — Patient Outreach (Signed)
[facility]) Care Management  11/10/2014  Malik Zuniga 31-Jan-1946 [id number]  RN Health Coach attempted  Follow up outreach call to patient.  Patient was unavailable.No answering machine pickup.  [facility] [phone]

## 2014-11-13 ENCOUNTER — Ambulatory Visit (HOSPITAL_COMMUNITY)
Admission: RE | Admit: 2014-11-13 | Discharge: 2014-11-13 | Disposition: A | Discharge status: Home / Self Care | Payer: Medicare Other | Source: Ambulatory Visit | Attending: Acute Care | Admitting: Acute Care

## 2014-11-13 DIAGNOSIS — R911 Solitary pulmonary nodule: Secondary | ICD-10-CM | POA: Diagnosis not present

## 2014-11-13 LAB — PULMONARY FUNCTION TEST
DL/VA % pred: 44 %
DL/VA: 2.05 ml/min/mmHg/L
DLCO UNC % PRED: 37 %
DLCO UNC: 12.14 ml/min/mmHg
FEF 25-75 PRE: 0.33 L/s
FEF 25-75 Post: 0.79 L/sec
FEF2575-%Change-Post: 140 %
FEF2575-%PRED-PRE: 13 %
FEF2575-%Pred-Post: 31 %
FEV1-%Change-Post: 38 %
FEV1-%PRED-POST: 45 %
FEV1-%Pred-Pre: 32 %
FEV1-Post: 1.5 L
FEV1-Pre: 1.08 L
FEV1FVC-%Change-Post: 6 %
FEV1FVC-%PRED-PRE: 57 %
FEV6-%CHANGE-POST: 28 %
FEV6-%PRED-PRE: 51 %
FEV6-%Pred-Post: 65 %
FEV6-POST: 2.77 L
FEV6-Pre: 2.15 L
FEV6FVC-%Change-Post: 0 %
FEV6FVC-%PRED-POST: 89 %
FEV6FVC-%Pred-Pre: 90 %
FVC-%Change-Post: 30 %
FVC-%Pred-Post: 74 %
FVC-%Pred-Pre: 56 %
FVC-Post: 3.3 L
FVC-Pre: 2.53 L
POST FEV6/FVC RATIO: 84 %
PRE FEV1/FVC RATIO: 43 %
Post FEV1/FVC ratio: 45 %
Pre FEV6/FVC Ratio: 85 %
RV % pred: 246 %
RV: 6.01 L
TLC % PRED: 119 %
TLC: 8.4 L

## 2014-11-13 MED ORDER — ALBUTEROL SULFATE (2.5 MG/3ML) 0.083% IN NEBU
2.5000 mg | INHALATION_SOLUTION | Freq: Once | RESPIRATORY_TRACT | Status: AC
  Administered 2014-11-13: 2.5 mg via RESPIRATORY_TRACT

## 2014-11-14 ENCOUNTER — Encounter: Payer: BLUE CROSS/BLUE SHIELD | Admitting: Family Medicine

## 2014-11-15 ENCOUNTER — Ambulatory Visit (INDEPENDENT_AMBULATORY_CARE_PROVIDER_SITE_OTHER): Payer: Medicare Other | Admitting: Family Medicine

## 2014-11-15 ENCOUNTER — Other Ambulatory Visit: Payer: Self-pay | Admitting: Family Medicine

## 2014-11-15 ENCOUNTER — Encounter: Payer: Self-pay | Admitting: Family Medicine

## 2014-11-15 VITALS — BP 126/70 | HR 85 | Temp 97.9°F | Resp 17 | Ht 70.0 in

## 2014-11-15 DIAGNOSIS — E785 Hyperlipidemia, unspecified: Secondary | ICD-10-CM | POA: Diagnosis not present

## 2014-11-15 DIAGNOSIS — Z0000 Encounter for general adult medical examination without abnormal findings: Secondary | ICD-10-CM | POA: Diagnosis not present

## 2014-11-15 DIAGNOSIS — I639 Cerebral infarction, unspecified: Secondary | ICD-10-CM

## 2014-11-15 DIAGNOSIS — F3170 Bipolar disorder, currently in remission, most recent episode unspecified: Secondary | ICD-10-CM | POA: Diagnosis not present

## 2014-11-15 DIAGNOSIS — I2510 Atherosclerotic heart disease of native coronary artery without angina pectoris: Secondary | ICD-10-CM

## 2014-11-15 DIAGNOSIS — J439 Emphysema, unspecified: Secondary | ICD-10-CM | POA: Diagnosis not present

## 2014-11-15 DIAGNOSIS — E875 Hyperkalemia: Secondary | ICD-10-CM

## 2014-11-15 DIAGNOSIS — Z8546 Personal history of malignant neoplasm of prostate: Secondary | ICD-10-CM

## 2014-11-15 DIAGNOSIS — Z8673 Personal history of transient ischemic attack (TIA), and cerebral infarction without residual deficits: Secondary | ICD-10-CM

## 2014-11-15 DIAGNOSIS — I10 Essential (primary) hypertension: Secondary | ICD-10-CM | POA: Diagnosis not present

## 2014-11-15 LAB — CBC WITH DIFFERENTIAL/PLATELET
BASOS ABS: 0 10*3/uL (ref 0.0–0.1)
Basophils Relative: 0.3 % (ref 0.0–3.0)
EOS ABS: 0.2 10*3/uL (ref 0.0–0.7)
Eosinophils Relative: 1.9 % (ref 0.0–5.0)
HCT: 40.4 % (ref 39.0–52.0)
Hemoglobin: 13.1 g/dL (ref 13.0–17.0)
LYMPHS ABS: 2.3 10*3/uL (ref 0.7–4.0)
Lymphocytes Relative: 24.6 % (ref 12.0–46.0)
MCHC: 32.5 g/dL (ref 30.0–36.0)
MCV: 88 fl (ref 78.0–100.0)
MONO ABS: 0.7 10*3/uL (ref 0.1–1.0)
MONOS PCT: 7.5 % (ref 3.0–12.0)
NEUTROS ABS: 6.2 10*3/uL (ref 1.4–7.7)
NEUTROS PCT: 65.7 % (ref 43.0–77.0)
PLATELETS: 387 10*3/uL (ref 150.0–400.0)
RBC: 4.59 Mil/uL (ref 4.22–5.81)
RDW: 16.3 % — ABNORMAL HIGH (ref 11.5–15.5)
WBC: 9.4 10*3/uL (ref 4.0–10.5)

## 2014-11-15 LAB — LIPID PANEL
CHOLESTEROL: 235 mg/dL — AB (ref 0–200)
HDL: 93.5 mg/dL (ref >39.00)
LDL CALC: 118 mg/dL — AB (ref 0–99)
NonHDL: 141.28
TRIGLYCERIDES: 114 mg/dL (ref 0.0–149.0)
Total CHOL/HDL Ratio: 3
VLDL: 22.8 mg/dL (ref 0.0–40.0)

## 2014-11-15 LAB — HEPATIC FUNCTION PANEL
ALBUMIN: 4.1 g/dL (ref 3.5–5.2)
ALK PHOS: 63 U/L (ref 39–117)
ALT: 13 U/L (ref 0–53)
AST: 21 U/L (ref 0–37)
BILIRUBIN DIRECT: 0.1 mg/dL (ref 0.0–0.3)
TOTAL PROTEIN: 7.4 g/dL (ref 6.0–8.3)
Total Bilirubin: 0.3 mg/dL (ref 0.2–1.2)

## 2014-11-15 LAB — BASIC METABOLIC PANEL
BUN: 15 mg/dL (ref 6–23)
CO2: 29 meq/L (ref 19–32)
Calcium: 9.8 mg/dL (ref 8.4–10.5)
Chloride: 92 mEq/L — ABNORMAL LOW (ref 96–112)
Creatinine, Ser: 0.86 mg/dL (ref 0.40–1.50)
GFR: 93.6 mL/min (ref >60.00)
GLUCOSE: 89 mg/dL (ref 70–99)
POTASSIUM: 5.7 meq/L — AB (ref 3.5–5.1)
Sodium: 130 mEq/L — ABNORMAL LOW (ref 135–145)

## 2014-11-15 LAB — TSH: TSH: 1.18 u[IU]/mL (ref 0.35–4.50)

## 2014-11-15 NOTE — Progress Notes (Signed)
Pre visit review using our clinic review tool, if applicable. No additional management support is needed unless otherwise documented below in the visit note. 

## 2014-11-15 NOTE — Patient Instructions (Signed)
Follow up in 6 months to recheck BP and cholesterol We'll notify you of your lab results and make any changes if needed Please have Dr Eulogio Ditch send me notes so I can follow along (this actually applies to all specialists!) We will be thinking about you and following along with your upcoming tests Call with any questions or concerns If you want to join [place] at the new [place] office, any scheduled appointments will automatically transfer and we will see you at [address] [place] [address] [place], [place], [place] [address]  Hang in there!!!

## 2014-11-15 NOTE — Progress Notes (Signed)
   Subjective:    Patient ID: Malik Zuniga, male    DOB: 1946/02/05, 69 y.o.   MRN: [id number]  HPI Here today for CPE.  Risk Factors: HTN- chronic problem, on Metoprolol and Hyzaar.  Denies CP, SOB above baseline, HAs, visual changes, edema Hyperlipidemia- chronic problem, on Simvastatin.  Denies abd pain, N/V COPD- pt had PFTs done Monday, following w/ Dr [name].  Currently on pulmicort and duonebs. Hx of CVA- following w/ neuro periodically, goal is risk reduction Bipolar- Plovsky, currently on Abilify, Depakote, Cymbalta Physical Activity: very limited Fall Risk: high Depression: bipolar- see above Hearing: decreased to conversational tones (doesn't wear hearing aides) ADL's: assistance w/ shower/tub transfers and dressing Cognitive: normal linear thought process, memory and attention intact Home Safety: safe at home, lives w/ wife Height, Weight, BMI, Visual Acuity: see vitals, vision corrected to 20/20 w/ glasses Counseling: colonoscopy was postponed due to multiple health issues- following w/ Dr Watt Climes.  Due for flu.  UTD w/ urology- seeing yearly. Care team reviewed and updated w/ pt Labs Ordered: See A&P Care Plan: See A&P    Review of Systems Patient reports no vision/hearing changes, anorexia, fever ,adenopathy, persistant/recurrent hoarseness, chest pain, palpitations, edema, hemoptysis, dyspnea (rest,exertional, paroxysmal nocturnal), gastrointestinal  bleeding (melena, rectal bleeding), abdominal pain, excessive heart burn, GU symptoms (dysuria, hematuria, voiding/incontinence issues) syncope, memory loss, skin/hair/nail changes, depression, anxiety, abnormal bruising/bleeding, musculoskeletal symptoms/signs.   + persistent cough + dysphagia- seeing Dr Watt Climes + R leg numbness- Dr Trenton Gammon (neurosurg)    Objective:   Physical Exam General Appearance:    Alert, cooperative, no distress, appears older than stated age  Head:    Normocephalic, without obvious abnormality,  atraumatic  Eyes:    PERRL, conjunctiva/corneas clear, EOM's intact, fundi    benign, both eyes       Ears:    Normal TM's and external ear canals, both ears  Nose:   Nares normal, septum midline, mucosa normal, no drainage   or sinus tenderness  Throat:   Lips, mucosa, and tongue normal; teeth and gums normal  Neck:   Supple, symmetrical, trachea midline, no adenopathy;       thyroid:  No enlargement/tenderness/nodules  Back:     Marked scoliotic curve  Lungs:     Clear to auscultation bilaterally, respirations unlabored  Chest wall:    No tenderness or deformity  Heart:    Regular rate and rhythm, S1 and S2 normal, no murmur, rub   or gallop  Abdomen:     Soft, non-tender, bowel sounds active all four quadrants,    no masses, no organomegaly  Genitalia:    Deferred to urology  Rectal:    Extremities:   Extremities normal, atraumatic, no cyanosis or edema  Pulses:   2+ and symmetric all extremities  Skin:   Skin color, texture, turgor normal, no rashes or lesions  Lymph nodes:   Cervical, supraclavicular, and axillary nodes normal  Neurologic:   CNII-XII intact. Normal strength, sensation and reflexes      throughout          Assessment & Plan:

## 2014-11-16 ENCOUNTER — Telehealth: Payer: Self-pay | Admitting: Acute Care

## 2014-11-16 ENCOUNTER — Encounter (HOSPITAL_COMMUNITY)
Admission: RE | Admit: 2014-11-16 | Discharge: 2014-11-16 | Disposition: A | Discharge status: Home / Self Care | Payer: Medicare Other | Source: Ambulatory Visit | Attending: Acute Care | Admitting: Acute Care

## 2014-11-16 DIAGNOSIS — R911 Solitary pulmonary nodule: Secondary | ICD-10-CM | POA: Diagnosis not present

## 2014-11-16 LAB — GLUCOSE, CAPILLARY: Glucose-Capillary: 96 mg/dL (ref 65–99)

## 2014-11-16 MED ORDER — FLUDEOXYGLUCOSE F - 18 (FDG) INJECTION
7.9000 | Freq: Once | INTRAVENOUS | Status: DC | PRN
  Administered 2014-11-16: 7.9 via INTRAVENOUS
  Filled 2014-11-16: qty 7.9

## 2014-11-16 NOTE — Telephone Encounter (Signed)
Mrs. Cunnington called this morning to see if the results of the PET scan we did this morning were resulted. I explained to her that the scan does show activity over the right lower lobe lung nodule that we discovered on the screening CT, but that there was no activity noted anywhere else. I told her that I would review both the results of the PET scan and the PFT's with Dr. [name], and that we will call them Monday afternoon and discuss next steps for care. She verbalized understanding. She has my contact information in the event she needs to get in touch with me for any additional questions.She had no further questions at the time we ended our call.

## 2014-11-19 NOTE — Assessment & Plan Note (Signed)
Following yearly w/ urology

## 2014-11-19 NOTE — Assessment & Plan Note (Signed)
Pt's PE unchanged from previous.  Following w/ GI- pt aware that he is overdue on colonoscopy, plan to schedule.  Following w/ urology.  UTD on immunizations- flu shot given today.  Check labs.  Anticipatory guidance provided.

## 2014-11-19 NOTE — Assessment & Plan Note (Signed)
Chronic problem.  Following w/ Dr Tamala Julian.  Currently asymptomatic.  Goal is risk reduction- BP and lipid control.  Check labs.  Adjust tx plan prn.

## 2014-11-19 NOTE — Assessment & Plan Note (Signed)
Chronic problem.  Goal is risk reduction w/ lipid and BP control.  Following w/ Neuro periodically.  Will follow along and assist as needed

## 2014-11-19 NOTE — Assessment & Plan Note (Signed)
Chronic problem.  Following w/ [name].  Denies current sxs.  Will continue to follow along and assist as able.

## 2014-11-19 NOTE — Assessment & Plan Note (Signed)
Chronic problem.  Following w/ Pulmonary.  Had new PFTs after abnormality detected on lung cancer screening CT.  Will follow along and assist as able.

## 2014-11-19 NOTE — Assessment & Plan Note (Signed)
Chronic problem.  Adequate control.  Asymptomatic at this time.  Check labs.  No anticipated med changes.

## 2014-11-19 NOTE — Assessment & Plan Note (Signed)
Chronic problem.  Tolerating statin w/o difficulty.  Check labs.  Adjust meds prn  

## 2014-11-20 ENCOUNTER — Telehealth: Payer: Self-pay | Admitting: Acute Care

## 2014-11-20 NOTE — Telephone Encounter (Signed)
I have called Mr and Mrs. Malik Zuniga to let them know that Dr. [name] has reviewed [name]' PFT's, PET  and CT scan. I explained to them that Dr. [name] feels the patient should be presented at the [facility] this Thursday morning to evaluate for the best plan of care moving forward. They both verbalized understanding. I explained that I will call them Thursday after the conference to let them know next steps in the plan of care per the multi-disciplinary team . They understand I will call Thursday, and have my contact number in the event they have additional questions before then.

## 2014-11-21 ENCOUNTER — Encounter (HOSPITAL_COMMUNITY): Payer: Medicare Other

## 2014-11-23 ENCOUNTER — Telehealth: Payer: Self-pay | Admitting: Pulmonary Disease

## 2014-11-23 DIAGNOSIS — R918 Other nonspecific abnormal finding of lung field: Secondary | ICD-10-CM

## 2014-11-23 NOTE — Telephone Encounter (Signed)
I called Malik Zuniga to discuss the results of the discussion in the multidisciplinary thoracic oncology conference today regarding his positive PET scan. There is a very high likelihood that this lesion is cancerous and I explained to him today that he needs a tissue diagnosis. It is felt that he is not a very good surgical candidate because of his profound deconditioning. This lesion is not amenable to bronchoscopy. Therefore the best approach to get tissue would be a percutaneous needle aspiration guided by CT. I'm going to order that test.  He voiced understanding.

## 2014-11-29 ENCOUNTER — Other Ambulatory Visit: Payer: Self-pay | Admitting: Radiology

## 2014-11-30 ENCOUNTER — Other Ambulatory Visit: Payer: Self-pay | Admitting: *Deleted

## 2014-11-30 ENCOUNTER — Encounter: Payer: Self-pay | Admitting: *Deleted

## 2014-11-30 NOTE — Patient Outreach (Addendum)
[facility]) Care Management  11/30/2014  Malik Zuniga [birth year] [id number]  RN Health Coach Telephone call to patient. [employer] compliance verified. Spoke with patient wife Malik Zuniga. Per patient wife the patient is not doing very well. Lung nodules was found and a biopsy is to be done on [id number]. Per wife possibility of lung nodule being cancer.  Patient is able to take his medication with yogurt. Patient is supplementing his diet with milkshakes.  Per wife they will know more after biopsy. Wife has agreed to follow up outreach call in one month. Assessment Patient will benefit from Health Coach telephonic outreach for education and support  Plan Follow up outreach call in one month. RN Health Coach will supplemental [employer] coupons to help patient with cost. [place] sent a calendar [place] [phone]

## 2014-12-01 ENCOUNTER — Ambulatory Visit (HOSPITAL_COMMUNITY)
Admission: RE | Admit: 2014-12-01 | Discharge: 2014-12-01 | Disposition: A | Discharge status: Home / Self Care | Payer: Medicare Other | Source: Ambulatory Visit | Attending: Pulmonary Disease | Admitting: Pulmonary Disease

## 2014-12-01 ENCOUNTER — Encounter (HOSPITAL_COMMUNITY): Payer: Self-pay

## 2014-12-01 ENCOUNTER — Ambulatory Visit (HOSPITAL_COMMUNITY)
Admission: RE | Admit: 2014-12-01 | Discharge: 2014-12-01 | Disposition: A | Discharge status: Home / Self Care | Payer: Medicare Other | Source: Ambulatory Visit | Attending: Interventional Radiology | Admitting: Interventional Radiology

## 2014-12-01 DIAGNOSIS — D649 Anemia, unspecified: Secondary | ICD-10-CM | POA: Insufficient documentation

## 2014-12-01 DIAGNOSIS — R0602 Shortness of breath: Secondary | ICD-10-CM | POA: Diagnosis not present

## 2014-12-01 DIAGNOSIS — Z9889 Other specified postprocedural states: Secondary | ICD-10-CM | POA: Diagnosis not present

## 2014-12-01 DIAGNOSIS — C3431 Malignant neoplasm of lower lobe, right bronchus or lung: Secondary | ICD-10-CM | POA: Diagnosis not present

## 2014-12-01 DIAGNOSIS — E785 Hyperlipidemia, unspecified: Secondary | ICD-10-CM | POA: Diagnosis not present

## 2014-12-01 DIAGNOSIS — I10 Essential (primary) hypertension: Secondary | ICD-10-CM | POA: Insufficient documentation

## 2014-12-01 DIAGNOSIS — I2510 Atherosclerotic heart disease of native coronary artery without angina pectoris: Secondary | ICD-10-CM | POA: Diagnosis not present

## 2014-12-01 DIAGNOSIS — Z01812 Encounter for preprocedural laboratory examination: Secondary | ICD-10-CM | POA: Diagnosis not present

## 2014-12-01 DIAGNOSIS — G709 Myoneural disorder, unspecified: Secondary | ICD-10-CM | POA: Diagnosis not present

## 2014-12-01 DIAGNOSIS — R918 Other nonspecific abnormal finding of lung field: Secondary | ICD-10-CM

## 2014-12-01 DIAGNOSIS — Z8546 Personal history of malignant neoplasm of prostate: Secondary | ICD-10-CM | POA: Diagnosis not present

## 2014-12-01 DIAGNOSIS — R911 Solitary pulmonary nodule: Secondary | ICD-10-CM | POA: Diagnosis present

## 2014-12-01 DIAGNOSIS — F419 Anxiety disorder, unspecified: Secondary | ICD-10-CM | POA: Diagnosis not present

## 2014-12-01 DIAGNOSIS — R251 Tremor, unspecified: Secondary | ICD-10-CM | POA: Diagnosis not present

## 2014-12-01 DIAGNOSIS — I252 Old myocardial infarction: Secondary | ICD-10-CM | POA: Insufficient documentation

## 2014-12-01 DIAGNOSIS — I671 Cerebral aneurysm, nonruptured: Secondary | ICD-10-CM | POA: Diagnosis not present

## 2014-12-01 LAB — PROTIME-INR
INR: 1.02 (ref 0.00–1.49)
PROTHROMBIN TIME: 13.6 s (ref 11.6–15.2)

## 2014-12-01 LAB — CBC
HCT: 37.8 % — ABNORMAL LOW (ref 39.0–52.0)
Hemoglobin: 12.8 g/dL — ABNORMAL LOW (ref 13.0–17.0)
MCH: 28.8 pg (ref 26.0–34.0)
MCHC: 33.9 g/dL (ref 30.0–36.0)
MCV: 84.9 fL (ref 78.0–100.0)
PLATELETS: 293 10*3/uL (ref 150–400)
RBC: 4.45 MIL/uL (ref 4.22–5.81)
RDW: 15.2 % (ref 11.5–15.5)
WBC: 6.2 10*3/uL (ref 4.0–10.5)

## 2014-12-01 LAB — APTT: aPTT: 34 seconds (ref 24–37)

## 2014-12-01 MED ORDER — MIDAZOLAM HCL 2 MG/2ML IJ SOLN
INTRAMUSCULAR | Status: AC | PRN
  Administered 2014-12-01: 1 mg via INTRAVENOUS

## 2014-12-01 MED ORDER — SODIUM CHLORIDE 0.9 % IV SOLN
Freq: Once | INTRAVENOUS | Status: AC
  Administered 2014-12-01: 08:00:00 via INTRAVENOUS

## 2014-12-01 MED ORDER — FENTANYL CITRATE (PF) 100 MCG/2ML IJ SOLN
INTRAMUSCULAR | Status: AC | PRN
  Administered 2014-12-01: 25 ug via INTRAVENOUS

## 2014-12-01 MED ORDER — FENTANYL CITRATE (PF) 100 MCG/2ML IJ SOLN
INTRAMUSCULAR | Status: AC
  Filled 2014-12-01: qty 4

## 2014-12-01 MED ORDER — MIDAZOLAM HCL 2 MG/2ML IJ SOLN
INTRAMUSCULAR | Status: AC
  Filled 2014-12-01: qty 6

## 2014-12-01 NOTE — H&P (Signed)
Chief Complaint: Patient was seen in consultation today for right lower lobe lung mass at the request of McQuaid,Douglas   Referring Physician(s): McQuaid,Douglas B  History of Present Illness: Malik Zuniga is a 69 y.o. male with COPD, previous tobacco user denies any current use. He states he underwent CT for lung cancer screening on 11/08/14 and found to have right lower lobe lung mass. Follow-up PET on 11/16/14 revealed mass to be hypermetabolic. He has been seen by Dr. [name] and scheduled today for image guided RLL lung mass biopsy. He denies any chest pain, shortness of breath or palpitations. He denies any active signs of bleeding or excessive bruising. He denies any hemoptysis, bleeding or clotting disorders. He denies any recent fever or chills. The patient denies any history of sleep apnea, but does sleep with 2.5 L of oxygen at night. He has previously tolerated sedation without complications.     Past Medical History  Diagnosis Date  . Hyperlipidemia     takes Simvastatin daily  . Tremor   . History of prostate cancer   . On home O2   . Neuromuscular disorder ([place]) 1998    right carpal tunnel release  . Anemia associated with acute blood loss   . Cancer [facility]) 2004    prostate  . GERD (gastroesophageal reflux disease)     takes Omeprazole daily  . Hypertension     takes Metoprolol daily as well as Hyzaar  . Constipation     takes Colace daily as well as Miralax  . Anxiety     takes Valium daily  . Depression     takes Cymbalta daily  . Emphysema of lung (H[facility])     Albuterol as needed;Symbicort daily and Singulair at bedtime  . Emphysema   . Myocardial infarction ([place]) 04/1998  . Coronary artery disease   . Asthma   . Shortness of breath     with exertion  . History of bronchitis   . Aspiration pneumonia ([place]) 2010  . Headache, chronic daily   . History of migraine     last migraine a couple of days ago;takes Excedrin Migraine  . Chronic back pain    compression fracture  . History of colon polyps   . Mood change ([place])     after Brain surgery mood changed and was placed on Depakote  . Insomnia     takes Benadryl nightly  . 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 Cholecystectomy  1996  . 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 Annette Stable  . Vascular stent  2000    Dr. Rollene Fare; he reports cardiac stent, but denies stent for PAD 06/15/13  . Hand surgery  1989    crushed thumb, Dr. Fredna Dow  . Ulnar nerve repair  1998    left arm, Dr. Fredna Dow  . Gastrostomy w/ feeding tube  2008    Dr. Watt Climes; only had for a few months  . Prostatectomy  04/2001    removal of prostate cancer, Dr. Janice Norrie  . Coronary angioplasty with stent placement  04/1998  . Carpal tunnel release  1998    right  . Spine surgery    . 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 Back surgery  08/2004; 02/2005; 04/2006; 06/2007; 7/20101    all by Dr. Annette Stable  . Esophagogastroduodenoscopy N/A 07/23/2012    Procedure: ESOPHAGOGASTRODUODENOSCOPY (EGD);  Surgeon: Jeryl Columbia, MD;  Location: [facility] ENDOSCOPY;  Service: Endoscopy;  Laterality: N/A;  buccini /ja  . Colonoscopy    . Back surgery  05/2013    Allergies: Lisinopril; Lamictal; and Ambien  Medications: Prior to Admission medications   Medication Sig Start Date End Date Taking? Authorizing Provider  ARIPiprazole (ABILIFY) 10 MG tablet Take 5 mg by mouth daily.  05/09/14  Yes Historical Provider, MD  Ascorbic Acid (VITAMIN C) 1000 MG tablet Take 1,000 mg by mouth daily.   Yes Historical Provider, MD  aspirin-acetaminophen-caffeine (EXCEDRIN MIGRAINE) [phone] MG per tablet Take 2 tablets by mouth every 6 (six) hours as needed. For headaches   Yes Historical Provider, MD  budesonide (PULMICORT) 0.25 MG/2ML nebulizer solution Take 4 mLs (0.5 mg total) by nebulization 2 (two) times daily. 12/07/13  Yes Kathee Delton, MD  cetirizine (ZYRTEC) 10 MG tablet Take 1 tablet (10 mg total) by  mouth daily. 04/12/14  Yes [name], MD  cholecalciferol (VITAMIN D) 1000 UNITS tablet Take 1,000 Units by mouth every morning.    Yes Historical Provider, MD  diazepam (VALIUM) 5 MG tablet Take 5 mg by mouth 3 (three) times daily. scheduled 06/22/13  Yes Earnie Larsson, MD  divalproex (DEPAKOTE) 500 MG 24 hr tablet Take 1,000 mg by mouth at bedtime.    Yes Historical Provider, MD  docusate sodium (COLACE) 100 MG capsule Take 200 mg by mouth at bedtime.    Yes Historical Provider, MD  DULoxetine (CYMBALTA) 60 MG capsule Take 60 mg by mouth daily.   Yes Historical Provider, MD  fluticasone (FLONASE) 50 MCG/ACT nasal spray PLACE 2 SPRAYS INTO BOTH NOSTRILS DAILY. Patient taking differently: PLACE 2 SPRAYS INTO BOTH NOSTRILS DAILY as needed for allergies 10/30/14  Yes [name], MD  HYDROcodone-acetaminophen (NORCO) 10-325 MG per tablet Take 1 tablet by mouth every 6 (six) hours as needed for moderate pain. for pain 04/07/14  Yes Historical Provider, MD  ipratropium-albuterol (DUONEB) 0.5-2.5 (3) MG/3ML SOLN Take 3 mLs by nebulization every 6 (six) hours. 12/07/13  Yes Kathee Delton, MD  losartan-hydrochlorothiazide (HYZAAR) 50-12.5 MG per tablet Take 1 tablet by mouth daily. 08/04/14  Yes Belva Crome, MD  metoprolol succinate (TOPROL-XL) 50 MG 24 hr tablet TAKE 1 TABLET (50 MG TOTAL) BY MOUTH EVERY MORNING. TAKE WITH OR IMMEDIATELY FOLLOWING A MEAL. 08/30/14  Yes [name], MD  montelukast (SINGULAIR) 10 MG tablet Take 1 tablet (10 mg total) by mouth at bedtime. 09/06/14  Yes [name], MD  mupirocin ointment (BACTROBAN) 2 % Apply 1 application topically 2 (two) times daily. Patient taking differently: Apply 1 application topically 2 (two) times daily as needed (irritations).  05/10/14  Yes [name], MD  NITROSTAT 0.4 MG SL tablet PLACE 1 TABLET (0.4 MG TOTAL) UNDER THE TONGUE EVERY 5 (FIVE) MINUTES AS NEEDED FOR CHEST PAIN. 08/10/14  Yes Belva Crome, MD  Omega-3  Fatty Acids (FISH OIL) 1000 MG CAPS Take 1 capsule by mouth every morning.    Yes Historical Provider, MD  omeprazole (PRILOSEC) 40 MG capsule Take 40 mg by mouth 2 (two) times daily.    Yes Historical Provider, MD  polyethylene glycol (MIRALAX / GLYCOLAX) packet Take 17 g by mouth 3 (three) times daily as needed for moderate constipation. For constipation 07/24/12  Yes Delfina Redwood, MD  primidone (MYSOLINE) 250 MG tablet TAKE 1 TABLET (250 MG TOTAL) BY MOUTH 2 (TWO) TIMES DAILY. 06/22/14  Yes  [name], MD  promethazine (PHENERGAN) 25 MG tablet Take 25 mg by mouth every 6 (six) hours as needed. for nausea 08/10/14  Yes Historical Provider, MD  Simethicone (MYLANTA GAS PO) Take 30 mLs by mouth daily as needed (for indigestion).    Yes Historical Provider, MD  simvastatin (ZOCOR) 40 MG tablet TAKE 1 TABLET (40 MG TOTAL) BY MOUTH EVERY EVENING. 09/20/14  Yes [name], MD  tiZANidine (ZANAFLEX) 4 MG tablet TAKE 1 TABLET BY MOUTH EVERY 8 HOURS AS NEEDED FOR MUSCLE SPASMS. 08/30/14  Yes [name], MD  Zinc 50 MG TABS Take 50 mg by mouth daily.    Yes Historical Provider, MD     Family History  Problem Relation Age of Onset  . Heart disease Mother   . Cancer Father     brain cancer and prostate cancer     Social History   Social History  . Marital Status: Married    Spouse Name: N/A  . Number of Children: N/A  . Years of Education: N/A   Social History Main Topics  . Smoking status: Former Smoker -- 1.50 packs/day for 52 years    Types: Cigarettes    Quit date: 07/17/2012  . Smokeless tobacco: Never Used     Comment: Former smoker  . Alcohol Use: No  . Drug Use: Yes    Special: [place]  . Sexual Activity: Yes   Other Topics Concern  . None   Social History Narrative   Review of Systems: A 12 point ROS discussed and pertinent positives are indicated in the HPI above.  All other systems are negative.  Review of Systems  Vital Signs: BP 140/84 mmHg   Pulse 71  Temp(Src) 97.9 F (36.6 C) (Oral)  Resp 18  Ht '5\' 10"'$  (1.778 m)  Wt 160 lb (72.576 kg)  BMI 22.96 kg/m2  SpO2 93%  Physical Exam  Constitutional: He is oriented to person, place, and time. No distress.  HENT:  Head: Normocephalic and atraumatic.  Neck: No tracheal deviation present.  Cardiovascular: Normal rate and regular rhythm.  Exam reveals no gallop and no friction rub.   No murmur heard. Pulmonary/Chest: Effort normal and breath sounds normal. He has no wheezes. He has no rales.  Abdominal: Soft. Bowel sounds are normal. He exhibits no distension. There is no tenderness.  Neurological: He is alert and oriented to person, place, and time.  Skin: Skin is warm and dry. He is not diaphoretic.    Mallampati Score:  MD Evaluation Airway: WNL Heart: WNL Abdomen: WNL Chest/ Lungs: WNL ASA  Classification: 3 Mallampati/Airway Score: Two  Imaging: Nm Pet Image Initial (pi) Skull Base To Thigh  11/16/2014  CLINICAL DATA:  Initial Treatment strategy for lung nodule. EXAM: NUCLEAR MEDICINE PET SKULL BASE TO THIGH TECHNIQUE: 7.9 mCi F-18 FDG was injected intravenously. Full-ring PET imaging was performed from the skull base to thigh after the radiotracer. CT data was obtained and used for attenuation correction and anatomic localization. FASTING BLOOD GLUCOSE:  Value: 96 mg/dl COMPARISON:  CT chest 11/08/2014 FINDINGS: NECK No hypermetabolic lymph nodes in the neck. CHEST No hypermetabolic mediastinal or hilar nodes. Right lower lobe pulmonary nodule measures 1.3 cm and has an SUV max equal to 3.5. There are no additional pulmonary nodules or masses identified. ABDOMEN/PELVIS No abnormal hypermetabolic activity within the liver, pancreas, adrenal glands, or spleen. Aortic atherosclerosis identified. No hypermetabolic lymph nodes in the abdomen or pelvis. SKELETON No focal hypermetabolic activity to  suggest skeletal metastasis. IMPRESSION: 1. Malignant range FDG uptake is  associated with the suspicious right lower lobe lung nodule. Recommend further evaluation with tissue sampling. 2. No specific findings identified to suggest hilar or mediastinal lymph node metastasis or distant metastatic disease. Electronically Signed   By: Kerby Moors M.D.   On: 11/16/2014 09:38   Ct Chest Lung Ca Screen Low Dose W/o Cm  11/08/2014  CLINICAL DATA:  Former smoker, quit 2 years ago, 59 pack-year history. EXAM: CT CHEST WITHOUT CONTRAST TECHNIQUE: Multidetector CT imaging of the chest was performed following the standard protocol without IV contrast. COMPARISON:  10/16/2013, 10/02/2011 and 11/26/2010. FINDINGS: Mediastinum/Nodes: Mediastinal lymph nodes are not enlarged by CT size criteria. Hilar regions are difficult to definitively evaluate without IV contrast but appear grossly unremarkable. No axillary adenopathy. Atherosclerotic calcification of the arterial vasculature, including three-vessel involvement of the coronary arteries. Heart size normal. No pericardial effusion. Lungs/Pleura: Moderate to severe centrilobular emphysema. A 12 mm irregular nodule in the subpleural aspect of the right lower lobe (series 5, image 189), appears slightly larger than on 10/16/2013, at which time it measured approximately 9 x 11 mm. It is clearly larger and more solid in appearance when compared with 11/26/2010, at which time it measured approximately 7 x 9 mm. Mild bronchial wall thickening. No pleural fluid. Airway is unremarkable. Upper abdomen: Low-attenuation lesions in the liver measure up to 7 mm in the dome, stable and likely cysts. Visualized portions of the adrenal glands, left kidney, spleen, pancreas, stomach and bowel are grossly unremarkable. No upper abdominal adenopathy. Musculoskeletal: No worrisome lytic or sclerotic lesions. Postoperative changes are seen in the spine. T8 compression fracture, remote. IMPRESSION: 1. Irregular right lower lobe nodule shows indolent growth from prior  exams and is worrisome for low-grade adenocarcinoma. Lung-RADS Category 4B, suspicious. Additional imaging evaluation or consultation with pulmonary medicine or thoracic surgery recommended. These results will be called to the ordering clinician or representative by the Radiologist Assistant, and communication documented in the PACS or zVision Dashboard. 2. Three-vessel coronary artery calcification. Electronically Signed   By: Lorin Picket M.D.   On: 11/08/2014 13:44    Labs:  CBC:  Recent Labs  11/15/14 1007 12/01/14 0745  WBC 9.4 6.2  HGB 13.1 12.8*  HCT 40.4 37.8*  PLT 387.0 293    COAGS:  Recent Labs  12/01/14 0745  INR 1.02  APTT 34    BMP:  Recent Labs  05/10/14 1110 05/19/14 1015 05/31/14 1127 11/15/14 1007  NA 126* 132* 130* 130*  K 4.6 5.6* 4.4 5.7*  CL 91* 96 97 92*  CO2 '31 27 25 29  '$ GLUCOSE 98 97 96 89  BUN 11 17 26* 15  CALCIUM 9.7 9.9 9.6 9.8  CREATININE 0.73 0.83 0.88 0.86    LIVER FUNCTION TESTS:  Recent Labs  05/10/14 1110 11/15/14 1007  BILITOT 0.2 0.3  AST 17 21  ALT 11 13  ALKPHOS 56 63  PROT 7.4 7.4  ALBUMIN 3.9 4.1    Assessment and Plan: COPD, previous tobacco user CT lung cancer screening done 11/08/14 Right lower lobe lung mass Hypermetabolic on PET 07/13/35 Seen by Dr. [name]  Scheduled today for image guided RLL lung mass biopsy with sedation The patient has been NPO, no blood thinners taken, labs and vitals have been reviewed. Risks and Benefits discussed with the patient including, but not limited to bleeding, hemoptysis, respiratory failure requiring intubation, infection, pneumothorax requiring chest tube placement, stroke from air embolism or  even death. All of the patient's questions were answered, patient is agreeable to proceed. Consent signed and in chart.   Thank you for this interesting consult.  I greatly enjoyed meeting KESLEY GAFFEY and look forward to participating in their care.  A copy of this  report was sent to the requesting provider on this date.  SignedHedy Jacob 12/01/2014, 8:29 AM   I spent a total of 15 Minutes in face to face in clinical consultation, greater than 50% of which was counseling/coordinating care for right lower lobe lung mass

## 2014-12-01 NOTE — Procedures (Signed)
Technically successful CT guided biopsy of indeterminate hypermetabolic nodule within the right lower lobe.  No immediate post procedural complications.   Ronny Bacon, MD Pager #: [phone]

## 2014-12-01 NOTE — Discharge Instructions (Signed)
Needle Biopsy of Lung, Care After Refer to this sheet in the next few weeks. These instructions provide you with information on caring for yourself after your procedure. Your health care provider may also give you more specific instructions. Your treatment has been planned according to current medical practices, but problems sometimes occur. Call your health care provider if you have any problems or questions after your procedure. WHAT TO EXPECT AFTER THE PROCEDURE  A bandage will be applied over the area where the needle was inserted. You may be asked to apply pressure to the bandage for several minutes to ensure there is minimal bleeding.  In most cases, you can leave when your needle biopsy procedure is completed. Do not drive yourself home. Someone else should take you home.  If you received an IV sedative or general anesthetic, you will be taken to a comfortable place to relax while the medicine wears off.  If you have upcoming travel scheduled, talk to your health care provider about when it is safe to travel by air after the procedure. HOME CARE INSTRUCTIONS  Expect to take it easy for the rest of the day.  Protect the area where you received the needle biopsy by keeping the bandage in place for as long as instructed.  You may feel some mild pain or discomfort in the area, but this should stop in a day or two.  Take medicines only as directed by your health care provider. SEEK MEDICAL CARE IF:   You have pain at the biopsy site that worsens or is not helped by medicine.  You have swelling or drainage at the needle biopsy site.  You have a fever. SEEK IMMEDIATE MEDICAL CARE IF:   You have new or worsening shortness of breath.  You have chest pain.  You are coughing up blood.  You have bleeding that does not stop with pressure or a bandage.  You develop light-headedness or fainting.   This information is not intended to replace advice given to you by your health care  provider. Make sure you discuss any questions you have with your health care provider.   Document Released: 11/24/2006 Document Revised: 02/17/2014 Document Reviewed: 06/21/2012 Elsevier Interactive Patient Education 2016 [place] Biopsy A lung biopsy is a procedure in which a tissue sample is removed from the lung. The tissue can be examined under a microscope to help diagnose various lung disorders.  LET [facility] CARE PROVIDER KNOW ABOUT:  Any allergies you have.  All medicines you are taking, including vitamins, herbs, eye drops, creams, and over-the-counter medicines.  Previous problems you or members of your family have had with the use of anesthetics.  Any blood disorders or bleeding problems that you have.  Previous surgeries you have had.  Medical conditions you have. RISKS AND COMPLICATIONS Generally, a lung biopsy is a safe procedure. However, problems can occur and include:  Collapse of the lung.   Bleeding.   Infection.  BEFORE THE PROCEDURE  Do not eat or drink anything after midnight on the night before the procedure or as directed by your health care provider.  Ask your health care provider about changing or stopping your regular medicines. This is especially important if you are taking diabetes medicines or blood thinners.  Plan to have someone take you home after the procedure. PROCEDURE Various methods can be used to perform a lung biopsy:   Needle biopsy. A biopsy needle is inserted into the lung. The needle is used to collect  the tissue sample. A CT scanner may be used to guide the needle to the right place in the lung. For this method, a medicine is used to numb the area where the biopsy sample will be taken (local anesthetic).  Bronchoscopy. A flexible tube (bronchoscope) is inserted into your lungs by going through your mouth or nose. A needle or forceps is passed through the bronchoscope to remove the tissue sample. For this method,  medicine may be used to numb the back of your throat.  Open biopsy. A cut (incision) is made in your chest. The tissue sample is then removed using surgical tools. The incision is closed with skin glue, skin adhesive strips, or stitches. For this method, you will be given medicine to make you sleep through the procedure (general anesthetic). AFTER THE PROCEDURE  Your recovery will be assessed and monitored.  You might have soreness and tenderness at the site of the biopsy for a few days after the procedure.  You might have a cough and some soreness in your throat for a few days if a bronchoscope was used.   This information is not intended to replace advice given to you by your health care provider. Make sure you discuss any questions you have with your health care provider.   Document Released: 04/17/2004 Document Revised: 02/17/2014 Document Reviewed: 07/11/2012 Elsevier Interactive Patient Education 2016 Elsevier Inc[employer] Moderate Conscious Sedation, Adult Sedation is the use of medicines to promote relaxation and relieve discomfort and anxiety. Moderate conscious sedation is a type of sedation. Under moderate conscious sedation you are less alert than normal but are still able to respond to instructions or stimulation. Moderate conscious sedation is used during short medical and dental procedures. It is milder than deep sedation or general anesthesia and allows you to return to your regular activities sooner. LET [facility] CARE PROVIDER KNOW ABOUT:   Any allergies you have.  All medicines you are taking, including vitamins, herbs, eye drops, creams, and over-the-counter medicines.  Use of steroids (by mouth or creams).  Previous problems you or members of your family have had with the use of anesthetics.  Any blood disorders you have.  Previous surgeries you have had.  Medical conditions you have.  Possibility of pregnancy, if this applies.  Use of cigarettes, alcohol, or  illegal drugs. RISKS AND COMPLICATIONS Generally, this is a safe procedure. However, as with any procedure, problems can occur. Possible problems include:  Oversedation.  Trouble breathing on your own. You may need to have a breathing tube until you are awake and breathing on your own.  Allergic reaction to any of the medicines used for the procedure. BEFORE THE PROCEDURE  You may have blood tests done. These tests can help show how well your kidneys and liver are working. They can also show how well your blood clots.  A physical exam will be done.  Only take medicines as directed by your health care provider. You may need to stop taking medicines (such as blood thinners, aspirin, or nonsteroidal anti-inflammatory drugs) before the procedure.   Do not eat or drink at least 6 hours before the procedure or as directed by your health care provider.  Arrange for a responsible adult, family member, or friend to take you home after the procedure. He or she should stay with you for at least 24 hours after the procedure, until the medicine has worn off. PROCEDURE   An intravenous (IV) catheter will be inserted into one of your veins. Medicine  will be able to flow directly into your body through this catheter. You may be given medicine through this tube to help prevent pain and help you relax.  The medical or dental procedure will be done. AFTER THE PROCEDURE  You will stay in a recovery area until the medicine has worn off. Your blood pressure and pulse will be checked.   Depending on the procedure you had, you may be allowed to go home when you can tolerate liquids and your pain is under control.   This information is not intended to replace advice given to you by your health care provider. Make sure you discuss any questions you have with your health care provider.   Document Released: 10/22/2000 Document Revised: 02/17/2014 Document Reviewed: 10/04/2012 Elsevier Interactive Patient  Education [employer].

## 2014-12-01 NOTE — Progress Notes (Signed)
Reviewed post procedure discharge instructions with patient and family.  Patient / family verbalized understanding of home care instructions. Stacey Drain

## 2014-12-05 ENCOUNTER — Telehealth: Payer: Self-pay | Admitting: Pulmonary Disease

## 2014-12-05 ENCOUNTER — Telehealth: Payer: Self-pay | Admitting: *Deleted

## 2014-12-05 ENCOUNTER — Encounter: Payer: Self-pay | Admitting: Pulmonary Disease

## 2014-12-05 DIAGNOSIS — C3431 Malignant neoplasm of lower lobe, right bronchus or lung: Secondary | ICD-10-CM | POA: Insufficient documentation

## 2014-12-05 DIAGNOSIS — C3491 Malignant neoplasm of unspecified part of right bronchus or lung: Secondary | ICD-10-CM

## 2014-12-05 NOTE — Telephone Encounter (Signed)
Oncology Nurse Navigator Documentation  Oncology Nurse Navigator Flowsheets 12/05/2014  Referral date to RadOnc/MedOnc 12/05/2014  Navigator Encounter Type Telephone;Introductory phone call/I received a referral on Malik Zuniga today.  I called to schedule him for thoracic clinic on 12/14/14 arrive at 3:15.  Patient verbalized understanding of appt time and place.   Patient Visit Type Initial  Treatment Phase Abnormal Scans  Interventions Coordination of Care  Coordination of Care MD Appointments  Time Spent with Patient 15

## 2014-12-05 NOTE — Telephone Encounter (Signed)
I called Malik Zuniga today to let him know that his transthoracic needle aspiration showed adenocarcinoma.  I explained that this meant we will be consulting radiation oncology for further management as he does not appear to be a good operative candidate.  He voiced understanding.

## 2014-12-08 ENCOUNTER — Encounter: Payer: Self-pay | Admitting: Radiation Oncology
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
## 2014-12-11 ENCOUNTER — Ambulatory Visit
Admission: RE | Admit: 2014-12-11 | Discharge: 2014-12-11 | Disposition: A | Discharge status: Home / Self Care | Payer: Medicare Other | Source: Ambulatory Visit | Attending: Radiation Oncology | Admitting: Radiation Oncology

## 2014-12-11 ENCOUNTER — Encounter: Payer: Self-pay | Admitting: Radiation Oncology

## 2014-12-11 VITALS — BP 99/57 | HR 76 | Temp 97.6°F | Resp 16 | Ht 68.0 in | Wt 160.0 lb

## 2014-12-11 DIAGNOSIS — Z510 Encounter for antineoplastic radiation therapy: Secondary | ICD-10-CM | POA: Diagnosis not present

## 2014-12-11 DIAGNOSIS — C3431 Malignant neoplasm of lower lobe, right bronchus or lung: Secondary | ICD-10-CM | POA: Diagnosis not present

## 2014-12-11 DIAGNOSIS — C3490 Malignant neoplasm of unspecified part of unspecified bronchus or lung: Secondary | ICD-10-CM | POA: Insufficient documentation

## 2014-12-11 DIAGNOSIS — I10 Essential (primary) hypertension: Secondary | ICD-10-CM | POA: Diagnosis not present

## 2014-12-11 DIAGNOSIS — Z8546 Personal history of malignant neoplasm of prostate: Secondary | ICD-10-CM | POA: Insufficient documentation

## 2014-12-11 DIAGNOSIS — Z86010 Personal history of colonic polyps: Secondary | ICD-10-CM | POA: Insufficient documentation

## 2014-12-11 DIAGNOSIS — D381 Neoplasm of uncertain behavior of trachea, bronchus and lung: Secondary | ICD-10-CM

## 2014-12-11 DIAGNOSIS — K219 Gastro-esophageal reflux disease without esophagitis: Secondary | ICD-10-CM | POA: Diagnosis not present

## 2014-12-11 DIAGNOSIS — I252 Old myocardial infarction: Secondary | ICD-10-CM | POA: Diagnosis not present

## 2014-12-11 DIAGNOSIS — Z9889 Other specified postprocedural states: Secondary | ICD-10-CM | POA: Diagnosis not present
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
## 2014-12-11 NOTE — Progress Notes (Signed)
See progress note under physician encounter. 

## 2014-12-12 ENCOUNTER — Telehealth: Payer: Self-pay | Admitting: Pulmonary Disease

## 2014-12-12 ENCOUNTER — Telehealth: Payer: Self-pay | Admitting: *Deleted

## 2014-12-12 MED ORDER — PREDNISONE 10 MG PO TABS: ORAL_TABLET | ORAL | Status: DC

## 2014-12-12 MED ORDER — DOXYCYCLINE HYCLATE 100 MG PO TABS: ORAL_TABLET | ORAL | Status: DC

## 2014-12-12 NOTE — Telephone Encounter (Signed)
Called and spoke to pt. Informed him of the recs per BQ. Rx sent to preferred pharmacy. Pt verbalized understanding and denied any further questions or concerns at this time.

## 2014-12-12 NOTE — Telephone Encounter (Signed)
Oncology Nurse Navigator Documentation  Oncology Nurse Navigator Flowsheets 12/12/2014  Navigator Encounter Type Telephone/per Dr. Tammi Klippel, patient would like to be seen by T-surgery.  I called left vm message regarding appt on 12/14/14 arrive at 2:45  Patient Visit Type Follow-up  Treatment Phase Abnormal Scans  Interventions Coordination of Care  Coordination of Care MD Appointments  Time Spent with Patient 15

## 2014-12-12 NOTE — Telephone Encounter (Signed)
Called and spoke to pt. Pt c/o increase in SOB, prod cough with brown mucus, fatigue, weakness, nausea, head congestion x 1-2 days. Pt denies CP/tightness, vomiting, diarrhea, chills, sweats. Pt states he feels warm to the touch but has not taken temperature. Pt states he feels too bad to come in for an appt.   Dr. [name] please advise. Thanks.

## 2014-12-12 NOTE — Telephone Encounter (Signed)
Doxycycline '100mg'$  po bid x5 days Prednisone taper Take '40mg'$  po daily for 3 days, then take '30mg'$  po daily for 3 days, then take '20mg'$  po daily for two days, then take '10mg'$  po daily for 2 days Call for office visit if no improvement in 48 hours, ER if worse

## 2014-12-13 ENCOUNTER — Telehealth: Payer: Self-pay | Admitting: *Deleted

## 2014-12-13 NOTE — Telephone Encounter (Signed)
Called pt and left a friendly reminder out his clinic appt tomorrow 12/14/14 on his voicemail.

## 2014-12-14 ENCOUNTER — Ambulatory Visit: Payer: Medicare Other | Admitting: Physical Therapy

## 2014-12-14 ENCOUNTER — Ambulatory Visit: Payer: Medicare Other | Attending: Cardiothoracic Surgery | Admitting: Physical Therapy

## 2014-12-14 ENCOUNTER — Institutional Professional Consult (permissible substitution) (INDEPENDENT_AMBULATORY_CARE_PROVIDER_SITE_OTHER): Payer: Medicare Other | Admitting: Cardiothoracic Surgery

## 2014-12-14 VITALS — BP 98/63 | HR 87 | Temp 97.5°F | Resp 16

## 2014-12-14 DIAGNOSIS — Z7409 Other reduced mobility: Secondary | ICD-10-CM | POA: Diagnosis not present

## 2014-12-14 DIAGNOSIS — I639 Cerebral infarction, unspecified: Secondary | ICD-10-CM

## 2014-12-14 DIAGNOSIS — R5381 Other malaise: Secondary | ICD-10-CM | POA: Insufficient documentation

## 2014-12-14 DIAGNOSIS — R293 Abnormal posture: Secondary | ICD-10-CM | POA: Insufficient documentation

## 2014-12-14 DIAGNOSIS — R911 Solitary pulmonary nodule: Secondary | ICD-10-CM | POA: Diagnosis not present
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
## 2014-12-15 ENCOUNTER — Encounter: Payer: Self-pay | Admitting: *Deleted

## 2014-12-15 ENCOUNTER — Telehealth: Payer: Self-pay | Admitting: Pulmonary Disease

## 2014-12-15 NOTE — Progress Notes (Signed)
PCP is [name], MD Referring Provider is Molli Barrows, MD  No chief complaint on file. the patient examined, chest CT scan, PET scan, and pathology report of lung biopsy all personally reviewed  HPI: The patient is a 69 year old chronically ill Caucasian male with history of smoking. He has severe limitation of mobility and cannot walk because of previous craniotomy for cerebral aneurysm and several back operations. He stopped smoking 2 years ago. The patient has had low dose screening CTs for the purpose of rule out cancer.earlier this year a CT scan showed a 1.8 cm right lower lobe nodule. This was positive on PET and a transthoracic biopsy showed this to be adenocarcinoma. Is clinical stage I.  The patient has chronic lung disease and uses home oxygen at night. He is unable to ambulate because of lower remedy weakness following craniotomy and cerebral aneurysm surgery as well as several lower back surgeries.  He denies hemoptysis chest wall pain fever weight loss. His had prostate cancer  treated with radioactive seeds.  Past Medical History  Diagnosis Date  . Hyperlipidemia     takes Simvastatin daily  . Tremor   . History of prostate cancer   . On home O2   . Neuromuscular disorder ([place]) 1998    right carpal tunnel release  . Anemia associated with acute blood loss   . GERD (gastroesophageal reflux disease)     takes Omeprazole daily  . Hypertension     takes Metoprolol daily as well as Hyzaar  . Constipation     takes Colace daily as well as Miralax  . Anxiety     takes Valium daily  . Depression     takes Cymbalta daily  . Emphysema of lung (HCC)     Albuterol as needed;Symbicort daily and Singulair at bedtime  . Emphysema   . Myocardial infarction ([place]) 04/1998  . Coronary artery disease   . Asthma   . Shortness of breath     with exertion  . History of bronchitis   . Aspiration pneumonia ([place]) 2010  . Headache, chronic daily   . History of migraine    last migraine a couple of days ago;takes Excedrin Migraine  . Chronic back pain     compression fracture  . History of colon polyps   . Mood change ([place])     after Brain surgery mood changed and was placed on Depakote  . Insomnia     takes Benadryl nightly  . Stroke [facility]) 1998    Brain Aneurysm  . Prostate cancer ([place]) 2004  . Lung cancer [facility])     Past Surgical History  Procedure Laterality Date  . Cholecystectomy  1996  . 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 Annette Stable  . Vascular stent  2000    Dr. Rollene Fare; he reports cardiac stent, but denies stent for PAD 06/15/13  . Hand surgery  1989    crushed thumb, Dr. Fredna Dow  . Ulnar nerve repair  1998    left arm, Dr. Fredna Dow  . Gastrostomy w/ feeding tube  2008    Dr. Watt Climes; only had for a few months  . Prostatectomy  04/2001    removal of prostate cancer, Dr. Janice Norrie  . Coronary angioplasty with stent placement  04/1998  . Carpal tunnel release  1998    right  . Spine surgery    . 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 Back surgery  08/2004; 02/2005; 04/2006;  06/2007; 7/20101    all by Dr. Annette Stable  . Esophagogastroduodenoscopy N/A 07/23/2012    Procedure: ESOPHAGOGASTRODUODENOSCOPY (EGD);  Surgeon: Jeryl Columbia, MD;  Location[facility] ENDOSCOPY;  Service: Endoscopy;  Laterality: N/A;  buccini /ja  . Colonoscopy    . Back surgery  05/2013  . Dg biopsy lung      Family History  Problem Relation Age of Onset  . Heart disease Mother   . Cancer Father     brain cancer and prostate cancer     Social History Social History  Substance Use Topics  . Smoking status: Former Smoker -- 1.50 packs/day for 52 years    Types: Cigarettes    Quit date: 07/17/2012  . Smokeless tobacco: Never Used     Comment: Former smoker  . Alcohol Use: No    Current Outpatient Prescriptions  Medication Sig Dispense Refill  . ARIPiprazole (ABILIFY) 10 MG tablet Take 5 mg by mouth daily.     . Ascorbic Acid (VITAMIN C) 1000 MG tablet Take 1,000 mg by mouth  daily.    Marland Kitchen aspirin-acetaminophen-caffeine (EXCEDRIN MIGRAINE) 250-250-65 MG per tablet Take 2 tablets by mouth every 6 (six) hours as needed. For headaches    . budesonide (PULMICORT) 0.25 MG/2ML nebulizer solution Take 4 mLs (0.5 mg total) by nebulization 2 (two) times daily. 60 mL 3  . cetirizine (ZYRTEC) 10 MG tablet Take 1 tablet (10 mg total) by mouth daily. 30 tablet 11  . cholecalciferol (VITAMIN D) 1000 UNITS tablet Take 1,000 Units by mouth every morning.     . diazepam (VALIUM) 5 MG tablet Take 5 mg by mouth 3 (three) times daily. scheduled    . divalproex (DEPAKOTE) 500 MG 24 hr tablet Take 1,000 mg by mouth at bedtime.     . docusate sodium (COLACE) 100 MG capsule Take 200 mg by mouth at bedtime.     Marland Kitchen doxycycline (VIBRA-TABS) 100 MG tablet Take 1 tablet, twice daily for 5 days. Avoid sunlight and take after meals. 10 tablet 0  . DULoxetine (CYMBALTA) 60 MG capsule Take 60 mg by mouth daily.    . fluticasone (FLONASE) 50 MCG/ACT nasal spray PLACE 2 SPRAYS INTO BOTH NOSTRILS DAILY. (Patient taking differently: PLACE 2 SPRAYS INTO BOTH NOSTRILS DAILY as needed for allergies) 16 g 2  . HYDROcodone-acetaminophen (NORCO) 10-325 MG per tablet Take 1 tablet by mouth every 6 (six) hours as needed for moderate pain. for pain  0  . ipratropium-albuterol (DUONEB) 0.5-2.5 (3) MG/3ML SOLN Take 3 mLs by nebulization every 6 (six) hours. 360 mL 3  . losartan-hydrochlorothiazide (HYZAAR) 50-12.5 MG per tablet Take 1 tablet by mouth daily. 30 tablet 6  . metoprolol succinate (TOPROL-XL) 50 MG 24 hr tablet TAKE 1 TABLET (50 MG TOTAL) BY MOUTH EVERY MORNING. TAKE WITH OR IMMEDIATELY FOLLOWING A MEAL. 30 tablet 3  . montelukast (SINGULAIR) 10 MG tablet Take 1 tablet (10 mg total) by mouth at bedtime. 30 tablet 3  . mupirocin ointment (BACTROBAN) 2 % Apply 1 application topically 2 (two) times daily. (Patient not taking: Reported on 12/11/2014) 30 g 3  . NITROSTAT 0.4 MG SL tablet PLACE 1 TABLET (0.4 MG  TOTAL) UNDER THE TONGUE EVERY 5 (FIVE) MINUTES AS NEEDED FOR CHEST PAIN. (Patient not taking: Reported on 12/11/2014) 25 tablet 1  . Omega-3 Fatty Acids (FISH OIL) 1000 MG CAPS Take 1 capsule by mouth every morning.     Marland Kitchen omeprazole (PRILOSEC) 40 MG capsule Take 40 mg by mouth  2 (two) times daily.     . polyethylene glycol (MIRALAX / GLYCOLAX) packet Take 17 g by mouth 3 (three) times daily as needed for moderate constipation. For constipation    . predniSONE (DELTASONE) 10 MG tablet Take '40mg'$  for 3 days, then '30mg'$  for 3 days, then '20mg'$  for 2 days, then '10mg'$  for 2 days, then stop. 27 tablet 0  . primidone (MYSOLINE) 250 MG tablet TAKE 1 TABLET (250 MG TOTAL) BY MOUTH 2 (TWO) TIMES DAILY. 60 tablet 6  . promethazine (PHENERGAN) 25 MG tablet Take 25 mg by mouth every 6 (six) hours as needed. for nausea  1  . simvastatin (ZOCOR) 40 MG tablet TAKE 1 TABLET (40 MG TOTAL) BY MOUTH EVERY EVENING. 90 tablet 1  . tiZANidine (ZANAFLEX) 4 MG tablet TAKE 1 TABLET BY MOUTH EVERY 8 HOURS AS NEEDED FOR MUSCLE SPASMS. 90 tablet 1  . Zinc 50 MG TABS Take 50 mg by mouth daily.      No current facility-administered medications for this visit.    Allergies  Allergen Reactions  . Lisinopril Swelling    ANGIOEDEMA  . Lamictal [Lamotrigine] Other (See Comments)    Weakness/difficulty swallowing  . Ambien [Zolpidem] Other (See Comments)    Unknown reaction    Review of Systems   No history thoracic surgery or thoracic, penetrating or blunt Right-hand dominant Uses a wheelchair at home.       Review of Systems :  [ y ] = yes, [  ] = no        General :  Weight gain [   ]    Weight loss  [   ]  Fatigue [  ]  Fever [  ]  Chills  [  ]                                Weakness  [  ]           Cardiac :  Chest pain/ pressure [  ]  Resting SOB [  ] exertional SOB [  ]                        Orthopnea [  ]  Pedal edema  [  ]  Palpitations [  ] Syncope/presyncope '[ ]'$                         Paroxysmal nocturnal  dyspnea [  ]        Pulmonary : cough [  ]  wheezing [  ]  Hemoptysis [  ] Sputum [  ] Snoring [  ]                              Pneumothorax [  ]  Sleep apnea [  ]       GI : Vomiting [  ]  Dysphagia [  ]  Melena  [  ]  Abdominal pain [  ] BRBPR [  ]              Heart burn [  ]  Constipation [  ] Diarrhea  [  ] Colonoscopy [  ]       GU : Hematuria [  ]  Dysuria [  ]  Nocturia [  ] UTI's [  ]  Vascular : Claudication [  ]  Rest pain [  ]  DVT [  ] Vein stripping [  ] leg ulcers [  ]                          TIA [  ] Stroke [place]  ]  Varicose veins [  ]       NEURO : severe lower extremity weakness precluding ambulation Headaches  [  ] Seizures [  ] Vision changes [  ] Paresthesias [  ]       Musculoskeletal :  Arthritis [  ] Gout  [  ]  Back pain [ yes ]  Joint pain [  ]       Skin :  Rash [  ]  Melanoma [  ]        Heme : Bleeding problems [  ]Clotting Disorders [  ] Anemia [  ]Blood Transfusion '[ ]'$        Endocrine : Diabetes [  ] Thyroid Disorder  [  ]       Psych : Depression [  ]  Anxiety [  ]  Psych hospitalizations [  ]                                               BP 98/63 mmHg  Pulse 87  Temp(Src) 97.5 F (36.4 C)  Resp 16  SpO2 95% Physical Exam       Physical Exam  General: chronically ill middle-aged Caucasian male in wheelchair accompanied by wife HEENT: Normocephalic pupils equal , dentition adequate Neck: Supple without JVD, adenopathy, or bruit Chest: Clear to auscultation, symmetrical breath sounds, no rhonchi, no tenderness             or deformity Cardiovascular: Regular rate and rhythm, no murmur, no gallop, peripheral pulses             palpable in all extremities Abdomen:  Soft, nontender, no palpable mass or organomegaly Extremities: Warm, well-perfused, no clubbing cyanosis edema or tenderness,              no venous stasis changes of the legs Rectal/GU: Deferred Neuro:extreme weakness both lower cavities Skin: Clean and dry without rash or  ulceration   Diagnostic Tests:  results ofCT scan counseled with patient  Impression: The patient has  a small peripheral right lower lobe malignant nodule less than 2 cm it appears to be clinical stage I by PET scan. However he is not a candidate for surgery because of his comorbidities including inability to walk and his debility from his previous craniotomy and strokes and multiple back operations.he is also on home oxygen at night.  Recommend the patient have stereotactic body radiation therapy to the documented malignancy. Risk with surgery would be extremely high and the outcome would probably preclude an  acceptable quality of life for this patient.  Plan: We'll see the patient in the future as needed. The patient, if interested in obtaining second surgical opinion, was recommended to see a thoracic surgeon at [facility]  Len Childs, MD [facility] [phone]

## 2014-12-15 NOTE — Telephone Encounter (Signed)
Called and spoke with pt Pt stated that he would like to have a brief conversation with Dr [name] about his cancer and the treatment options Pt states that he was told to start radiation tx but pt would like rec from BQ before he starts treatment He states that he is very hesitant and trust BQ rec of what he should do next  Dr [name], pt would like a return call. Thanks

## 2014-12-15 NOTE — Progress Notes (Signed)
Oncology Nurse Navigator Documentation  Oncology Nurse Navigator Flowsheets 12/15/2014  Navigator Encounter Type Clinic/MDC 12/14/14  Patient Visit Type Follow-up  Treatment Phase Abnormal Scans  Barriers/Navigation Needs Education  Education Newly Diagnosed Cancer Education  Time Spent with Patient 15         Thoracic Treatment Summary Name:Malik Zuniga Date:12/15/2014 DOB:[birth year] Your Medical Team  Radiation Oncologist:Dr. Tammi Klippel Surgeon:Dr. VanTrigt Type and Stage of Lung Cancer Non-Small Cell Carcinoma: Adenocarcinoma  Clinical Stage:  No matching staging information was found for the patient.   Clinical stage is based on radiology exams.  Pathological stage will be determined after surgery.  Staging is based on the size of the tumor, involvement of lymph nodes or not, and whether or not the cancer center has spread. Recommendations Recommendations: Radiation therapy   These recommendations are based on information available as of today's consult.  This is subject to change depending further testing or exams. Next Steps Next Step: Radiation Oncology will set up follow up appointments  Barriers to Care What do you perceive as a potential barrier that may prevent you from receiving your treatment plan? Education Information given and explained  Support Information given and explained    Resources Given: [employer] booklet on lung cancer  Research scientist (life sciences) at [employer].Radonna Ricker [phone] Fall Risk Information    Questions Norton Blizzard, RN BSN Thoracic Oncology Nurse Navigator at [facility] is a nurse navigator that is available to assist you through your cancer journey.  She can answer your questions and/or provide resources regarding your treatment plan, emotional support, or financial concerns.

## 2014-12-15 NOTE — Telephone Encounter (Signed)
I called him this afternoon and discussed the situation with him.  I have advised that he proceed with SBRT as recommended by Dr. Tammi Klippel.

## 2014-12-19 ENCOUNTER — Telehealth: Payer: Self-pay | Admitting: *Deleted

## 2014-12-19 NOTE — Telephone Encounter (Signed)
Oncology Nurse Navigator Documentation  Oncology Nurse Navigator Flowsheets 12/19/2014  Navigator Encounter Type Telephone/called patient today to check on his decision about tx.  I left vm message to call with my name and phone number   Patient Visit Type Follow-up  Treatment Phase Abnormal Scans  Interventions Coordination of Care  Coordination of Care MD Appointments  Time Spent with Patient 15

## 2014-12-21 ENCOUNTER — Ambulatory Visit: Payer: Medicare Other | Admitting: Family Medicine

## 2014-12-22 ENCOUNTER — Telehealth: Payer: Self-pay | Admitting: *Deleted

## 2014-12-22 ENCOUNTER — Telehealth: Payer: Self-pay | Admitting: Radiation Oncology

## 2014-12-22 NOTE — Telephone Encounter (Signed)
Oncology Nurse Navigator Documentation  Oncology Nurse Navigator Flowsheets 12/22/2014  Navigator Encounter Type Telephone/called and spoke with patient today.  I asked how he was doing and his thoughts on tx.  He stated he would like tx.  I stated I would notify Rad Onc to schedule.  He ws thankful for the call.  Patient Visit Type Follow-up  Treatment Phase Abnormal Scans  Interventions Coordination of Care  Coordination of Care MD Appointments  Time Spent with Patient 15

## 2014-12-22 NOTE — Telephone Encounter (Signed)
[name], patient's wife, left message requesting return call. She phoned to inquire about next step since patient is ready to move forward with radiation. Confirmed CT SIM for lung SBRT appointment on November 18th at 0800.

## 2014-12-25 ENCOUNTER — Telehealth: Payer: Self-pay | Admitting: Pulmonary Disease

## 2014-12-25 MED ORDER — LEVOFLOXACIN 500 MG PO TABS: 500.0000 mg | ORAL_TABLET | Freq: Every day | ORAL | Status: DC

## 2014-12-25 MED ORDER — PREDNISONE 10 MG PO TABS
ORAL_TABLET | ORAL | Status: DC
Start: 2014-12-25 — End: 2015-01-19

## 2014-12-25 NOTE — Telephone Encounter (Signed)
Called and spoke with pt Pt c/o productive cough with tan sputum, PND, sinus pressure, and headache x 2 weeks. Pt states that doctor recently called in an antibiotic and prednisone that helped some but symptoms are not completely gone. Pt would like new rx called into his pharmacy. Advised pt that message would be given to doctor and we would call back with rec. Pt voiced understanding  Will forward message to DOD since BQ is off this afternoon   Dr Vaughan Browner please advise. Thanks

## 2014-12-25 NOTE — Telephone Encounter (Signed)
Levaquin 500 mg qd for 7 days. Pred taper starting at 60 mg. Reduce by 10 mg every 3 days

## 2014-12-25 NOTE — Telephone Encounter (Signed)
Called spoke with spouse. Aware of recs. RX's sent in. Nothing further needed

## 2014-12-28 ENCOUNTER — Telehealth: Payer: Self-pay | Admitting: Pulmonary Disease

## 2014-12-28 MED ORDER — DOXYCYCLINE HYCLATE 100 MG PO TABS
ORAL_TABLET | ORAL | Status: DC
Start: 2014-12-28 — End: 2015-01-19

## 2014-12-28 NOTE — Telephone Encounter (Signed)
BQ pt. Spoke with pt. He reports he has been taking the levaquin 500 mg since 11/14 called in by Dr. Vaughan Browner. He does not feel this has been helping. He reports he did better on doxy.  He c/o prod cough (dark tan phlem), congestion. No wheezing, no chest tx, no SOB. BQ on nightflight. Will forward to DOD. Please advise Dr. Annamaria Boots thanks  Allergies  Allergen Reactions  . Lisinopril Swelling    ANGIOEDEMA  . Lamictal [Lamotrigine] Other (See Comments)    Weakness/difficulty swallowing  . Ambien [Zolpidem] Other (See Comments)    Unknown reaction     Current Outpatient Prescriptions on File Prior to Visit  Medication Sig Dispense Refill  . ARIPiprazole (ABILIFY) 10 MG tablet Take 5 mg by mouth daily.     . Ascorbic Acid (VITAMIN C) 1000 MG tablet Take 1,000 mg by mouth daily.    Marland Kitchen aspirin-acetaminophen-caffeine (EXCEDRIN MIGRAINE) 250-250-65 MG per tablet Take 2 tablets by mouth every 6 (six) hours as needed. For headaches    . budesonide (PULMICORT) 0.25 MG/2ML nebulizer solution Take 4 mLs (0.5 mg total) by nebulization 2 (two) times daily. 60 mL 3  . cetirizine (ZYRTEC) 10 MG tablet Take 1 tablet (10 mg total) by mouth daily. 30 tablet 11  . cholecalciferol (VITAMIN D) 1000 UNITS tablet Take 1,000 Units by mouth every morning.     . diazepam (VALIUM) 5 MG tablet Take 5 mg by mouth 3 (three) times daily. scheduled    . divalproex (DEPAKOTE) 500 MG 24 hr tablet Take 1,000 mg by mouth at bedtime.     . docusate sodium (COLACE) 100 MG capsule Take 200 mg by mouth at bedtime.     Marland Kitchen doxycycline (VIBRA-TABS) 100 MG tablet Take 1 tablet, twice daily for 5 days. Avoid sunlight and take after meals. 10 tablet 0  . DULoxetine (CYMBALTA) 60 MG capsule Take 60 mg by mouth daily.    . fluticasone (FLONASE) 50 MCG/ACT nasal spray PLACE 2 SPRAYS INTO BOTH NOSTRILS DAILY. (Patient taking differently: PLACE 2 SPRAYS INTO BOTH NOSTRILS DAILY as needed for allergies) 16 g 2  . HYDROcodone-acetaminophen (NORCO)  10-325 MG per tablet Take 1 tablet by mouth every 6 (six) hours as needed for moderate pain. for pain  0  . ipratropium-albuterol (DUONEB) 0.5-2.5 (3) MG/3ML SOLN Take 3 mLs by nebulization every 6 (six) hours. 360 mL 3  . levofloxacin (LEVAQUIN) 500 MG tablet Take 1 tablet (500 mg total) by mouth daily. 7 tablet 0  . losartan-hydrochlorothiazide (HYZAAR) 50-12.5 MG per tablet Take 1 tablet by mouth daily. 30 tablet 6  . metoprolol succinate (TOPROL-XL) 50 MG 24 hr tablet TAKE 1 TABLET (50 MG TOTAL) BY MOUTH EVERY MORNING. TAKE WITH OR IMMEDIATELY FOLLOWING A MEAL. 30 tablet 3  . montelukast (SINGULAIR) 10 MG tablet Take 1 tablet (10 mg total) by mouth at bedtime. 30 tablet 3  . mupirocin ointment (BACTROBAN) 2 % Apply 1 application topically 2 (two) times daily. (Patient not taking: Reported on 12/11/2014) 30 g 3  . NITROSTAT 0.4 MG SL tablet PLACE 1 TABLET (0.4 MG TOTAL) UNDER THE TONGUE EVERY 5 (FIVE) MINUTES AS NEEDED FOR CHEST PAIN. (Patient not taking: Reported on 12/11/2014) 25 tablet 1  . Omega-3 Fatty Acids (FISH OIL) 1000 MG CAPS Take 1 capsule by mouth every morning.     Marland Kitchen omeprazole (PRILOSEC) 40 MG capsule Take 40 mg by mouth 2 (two) times daily.     . polyethylene glycol (MIRALAX / GLYCOLAX)  packet Take 17 g by mouth 3 (three) times daily as needed for moderate constipation. For constipation    . predniSONE (DELTASONE) 10 MG tablet Take 60 mg daily x 3 days, 50 mg daily x 3 days, '40mg'$  x3 days, '30mg'$  x 3 days, '20mg'$  x 3 days,  '10mg'$  for 3 days, then stop. 63 tablet 0  . primidone (MYSOLINE) 250 MG tablet TAKE 1 TABLET (250 MG TOTAL) BY MOUTH 2 (TWO) TIMES DAILY. 60 tablet 6  . promethazine (PHENERGAN) 25 MG tablet Take 25 mg by mouth every 6 (six) hours as needed. for nausea  1  . simvastatin (ZOCOR) 40 MG tablet TAKE 1 TABLET (40 MG TOTAL) BY MOUTH EVERY EVENING. 90 tablet 1  . tiZANidine (ZANAFLEX) 4 MG tablet TAKE 1 TABLET BY MOUTH EVERY 8 HOURS AS NEEDED FOR MUSCLE SPASMS. 90 tablet 1   . Zinc 50 MG TABS Take 50 mg by mouth daily.      No current facility-administered medications on file prior to visit.

## 2014-12-28 NOTE — Telephone Encounter (Signed)
Ok to change to doxycycline 100 mg, # 14, 1 twice daily x 7 days, instead of levaquin.

## 2014-12-28 NOTE — Telephone Encounter (Signed)
Called made pt aware of recs. RX sent into [facility]. FYI to Dr. [name].

## 2014-12-29 ENCOUNTER — Ambulatory Visit: Payer: Self-pay | Admitting: *Deleted

## 2014-12-29 ENCOUNTER — Encounter: Payer: Self-pay | Admitting: Radiation Oncology

## 2014-12-29 ENCOUNTER — Other Ambulatory Visit: Payer: Self-pay | Admitting: *Deleted

## 2014-12-29 ENCOUNTER — Telehealth: Payer: Self-pay | Admitting: Pulmonary Disease

## 2014-12-29 ENCOUNTER — Ambulatory Visit: Payer: Medicare Other | Admitting: *Deleted

## 2014-12-29 DIAGNOSIS — Z8546 Personal history of malignant neoplasm of prostate: Secondary | ICD-10-CM | POA: Diagnosis not present

## 2014-12-29 DIAGNOSIS — K219 Gastro-esophageal reflux disease without esophagitis: Secondary | ICD-10-CM | POA: Diagnosis not present

## 2014-12-29 DIAGNOSIS — I10 Essential (primary) hypertension: Secondary | ICD-10-CM | POA: Diagnosis not present

## 2014-12-29 DIAGNOSIS — C3490 Malignant neoplasm of unspecified part of unspecified bronchus or lung: Secondary | ICD-10-CM | POA: Diagnosis not present

## 2014-12-29 DIAGNOSIS — Z9889 Other specified postprocedural states: Secondary | ICD-10-CM | POA: Diagnosis not present

## 2014-12-29 DIAGNOSIS — Z510 Encounter for antineoplastic radiation therapy: Secondary | ICD-10-CM | POA: Diagnosis not present

## 2014-12-29 NOTE — Progress Notes (Signed)
  Radiation Oncology         [phone] ________________________________  Name: Malik Zuniga MRN: [id number]  Date: 12/29/2014  DOB: Jul 19, 1945  STEREOTACTIC BODY RADIOTHERAPY SIMULATION AND TREATMENT PLANNING NOTE  DIAGNOSIS:  [name] is a 69 y.o. 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  Identity was confirmed.  All relevant records and images related to the planned course of therapy were reviewed.  The patient freely provided informed written consent to proceed with treatment after reviewing the details related to the planned course of therapy. The consent form was witnessed and verified by the simulation staff.  Then, the patient was set-up in a stable reproducible  supine position for radiation therapy.  A BodyFix immobilization pillow was fabricated for reproducible positioning.  Then I personally applied the abdominal compression paddle to limit respiratory excursion.  4D respiratoy motion management CT images were obtained.  Surface markings were placed.  The CT images were loaded into the planning software.  Then, using Cine, MIP, and standard views, the internal target volume (ITV) and planning target volumes (PTV) were delinieated, and avoidance structures were contoured.  Treatment planning then occurred.  The radiation prescription was entered and confirmed.  A total of two complex treatment devices were fabricated in the form of the BodyFix immobilization pillow and a neck accuform cushion.  I have requested : 3D Simulation  I have requested a DVH of the following structures: Heart, Lungs, Esophagus, Chest Wall, Brachial Plexus, Major Blood Vessels, and targets.  PLAN:  The patient will receive 60 Gy in 5 fractions.  ________________________________  Sheral Apley Tammi Klippel, M.D.  This document serves as a record of services personally performed by Tyler Pita, MD. It was  created on his behalf by [name], a trained medical scribe. The creation of this record is based on the scribe's personal observations and the provider's statements to them. This document has been checked and approved by the attending provider.

## 2014-12-29 NOTE — Patient Outreach (Signed)
[facility]) Care Management  12/29/2014  Malik Zuniga Jul 20, 1945 [id number]   RN Health Coach telephone call to patient.  Hipaa compliance verified. Per patient the lung nodule is cancer. Patient is planning to start radiation therapy. Patient is still having symptoms of dysphagia. Per patient he has been coughing up mucous and having sinus symptoms. Per patient he was started on another antibiotic because the last one was not working as good. Per patient he can tell a change and this is working much better.   Patient agreed to outreach follow up calls Assessment Patient will benefit from [place] telephonic outreach for education and support for COPD and cancer. Patient needs additional nutritional supplement for diet  Plan RN Health Coach will provide ongoing education for patient on COPD through phone calls and sending printed information to patient for further discussion RN Health coach sent Nutritional supplement discounts on ensure and [place] sent EMMI on Flu and cold symptoms when to call doctor RN Health Coach sent EMMI on common colds [place] sent EMMI on Lung Cancer non- small cell [place] sent EMMI on Dry [place] will follow up within a month  [facility] [phone]

## 2014-12-29 NOTE — Telephone Encounter (Signed)
Called spoke with pt. He reports he is feeling much better since start on the doxy yesterday. He was very thankful. Nothing further needed

## 2014-12-30 ENCOUNTER — Other Ambulatory Visit: Payer: Self-pay | Admitting: Family Medicine

## 2015-01-01 NOTE — Telephone Encounter (Signed)
Medication filled to pharmacy as requested.   

## 2015-01-02 DIAGNOSIS — R110 Nausea: Secondary | ICD-10-CM | POA: Diagnosis not present

## 2015-01-02 DIAGNOSIS — K5900 Constipation, unspecified: Secondary | ICD-10-CM | POA: Diagnosis not present

## 2015-01-02 DIAGNOSIS — R1310 Dysphagia, unspecified: Secondary | ICD-10-CM | POA: Diagnosis not present

## 2015-01-07 ENCOUNTER — Other Ambulatory Visit: Payer: Self-pay | Admitting: Family Medicine

## 2015-01-08 NOTE — Telephone Encounter (Signed)
Medication filled to pharmacy as requested.   

## 2015-01-09 ENCOUNTER — Other Ambulatory Visit: Payer: Self-pay | Admitting: Family Medicine

## 2015-01-09 ENCOUNTER — Ambulatory Visit
Admission: RE | Admit: 2015-01-09 | Discharge: 2015-01-09 | Disposition: A | Discharge status: Home / Self Care | Payer: Medicare Other | Source: Ambulatory Visit | Attending: Radiation Oncology | Admitting: Radiation Oncology

## 2015-01-09 ENCOUNTER — Other Ambulatory Visit: Payer: Self-pay | Admitting: Pulmonary Disease

## 2015-01-09 NOTE — Telephone Encounter (Signed)
Medication filled to pharmacy as requested.   

## 2015-01-09 NOTE — Telephone Encounter (Signed)
Received refill request for prednisone taper. Please advise Dr. [name] thanks

## 2015-01-10 DIAGNOSIS — I10 Essential (primary) hypertension: Secondary | ICD-10-CM | POA: Diagnosis not present

## 2015-01-10 DIAGNOSIS — Z8546 Personal history of malignant neoplasm of prostate: Secondary | ICD-10-CM | POA: Diagnosis not present

## 2015-01-10 DIAGNOSIS — Z9889 Other specified postprocedural states: Secondary | ICD-10-CM | POA: Diagnosis not present

## 2015-01-10 DIAGNOSIS — C3490 Malignant neoplasm of unspecified part of unspecified bronchus or lung: Secondary | ICD-10-CM | POA: Diagnosis not present

## 2015-01-10 DIAGNOSIS — Z510 Encounter for antineoplastic radiation therapy: Secondary | ICD-10-CM | POA: Diagnosis not present

## 2015-01-10 DIAGNOSIS — K219 Gastro-esophageal reflux disease without esophagitis: Secondary | ICD-10-CM | POA: Diagnosis not present

## 2015-01-11 ENCOUNTER — Encounter: Payer: Self-pay | Admitting: Radiation Oncology

## 2015-01-11 ENCOUNTER — Ambulatory Visit
Admission: RE | Admit: 2015-01-11 | Discharge: 2015-01-11 | Disposition: A | Discharge status: Home / Self Care | Payer: Medicare Other | Source: Ambulatory Visit | Attending: Radiation Oncology | Admitting: Radiation Oncology

## 2015-01-11 ENCOUNTER — Telehealth: Payer: Self-pay | Admitting: Pulmonary Disease

## 2015-01-11 DIAGNOSIS — I10 Essential (primary) hypertension: Secondary | ICD-10-CM | POA: Diagnosis not present

## 2015-01-11 DIAGNOSIS — K219 Gastro-esophageal reflux disease without esophagitis: Secondary | ICD-10-CM | POA: Diagnosis not present

## 2015-01-11 DIAGNOSIS — Z8546 Personal history of malignant neoplasm of prostate: Secondary | ICD-10-CM | POA: Diagnosis not present

## 2015-01-11 DIAGNOSIS — Z510 Encounter for antineoplastic radiation therapy: Secondary | ICD-10-CM | POA: Diagnosis not present

## 2015-01-11 DIAGNOSIS — C3490 Malignant neoplasm of unspecified part of unspecified bronchus or lung: Secondary | ICD-10-CM | POA: Diagnosis not present

## 2015-01-11 DIAGNOSIS — Z9889 Other specified postprocedural states: Secondary | ICD-10-CM | POA: Diagnosis not present

## 2015-01-11 MED ORDER — AMOXICILLIN-POT CLAVULANATE 875-125 MG PO TABS: 1.0000 | ORAL_TABLET | Freq: Two times a day (BID) | ORAL | Status: DC

## 2015-01-11 NOTE — Telephone Encounter (Signed)
It sounds like he needs another round. Have him take augmentin 875 bid x 5 days, come to clinic if no improvement

## 2015-01-11 NOTE — Telephone Encounter (Signed)
[phone], pt cb

## 2015-01-11 NOTE — Telephone Encounter (Signed)
LMTCB x 1 

## 2015-01-11 NOTE — Telephone Encounter (Signed)
Called spoke with pt. He reports he is not feeling bad but he is still having a lot of coughing. He coughs up dark brown phlem. He states he is not smoking currently. He was given doxy x 7 days on 11/17. He wants to know if he needs another round. He denies any wheezing, no chest tx, SOB is stable. Please advise Dr. [name] thanks

## 2015-01-11 NOTE — Telephone Encounter (Signed)
Called and spoke with pt and informed of BQ rec Pt voiced understanding of rec  Order sent electronically to pharmacy  Nothing further is needed

## 2015-01-15 ENCOUNTER — Ambulatory Visit
Admission: RE | Admit: 2015-01-15 | Discharge: 2015-01-15 | Disposition: A | Discharge status: Home / Self Care | Payer: Medicare Other | Source: Ambulatory Visit | Attending: Radiation Oncology | Admitting: Radiation Oncology

## 2015-01-15 DIAGNOSIS — K219 Gastro-esophageal reflux disease without esophagitis: Secondary | ICD-10-CM | POA: Diagnosis not present

## 2015-01-15 DIAGNOSIS — C3490 Malignant neoplasm of unspecified part of unspecified bronchus or lung: Secondary | ICD-10-CM | POA: Diagnosis not present

## 2015-01-15 DIAGNOSIS — Z9889 Other specified postprocedural states: Secondary | ICD-10-CM | POA: Diagnosis not present

## 2015-01-15 DIAGNOSIS — Z510 Encounter for antineoplastic radiation therapy: Secondary | ICD-10-CM | POA: Diagnosis not present

## 2015-01-15 DIAGNOSIS — Z8546 Personal history of malignant neoplasm of prostate: Secondary | ICD-10-CM | POA: Diagnosis not present

## 2015-01-15 DIAGNOSIS — I10 Essential (primary) hypertension: Secondary | ICD-10-CM | POA: Diagnosis not present

## 2015-01-16 ENCOUNTER — Ambulatory Visit: Payer: Medicare Other | Admitting: Radiation Oncology

## 2015-01-17 ENCOUNTER — Ambulatory Visit
Admission: RE | Admit: 2015-01-17 | Discharge: 2015-01-17 | Disposition: A | Discharge status: Home / Self Care | Payer: Medicare Other | Source: Ambulatory Visit | Attending: Radiation Oncology | Admitting: Radiation Oncology

## 2015-01-17 DIAGNOSIS — I10 Essential (primary) hypertension: Secondary | ICD-10-CM | POA: Diagnosis not present

## 2015-01-17 DIAGNOSIS — Z8546 Personal history of malignant neoplasm of prostate: Secondary | ICD-10-CM | POA: Diagnosis not present

## 2015-01-17 DIAGNOSIS — Z510 Encounter for antineoplastic radiation therapy: Secondary | ICD-10-CM | POA: Diagnosis not present

## 2015-01-17 DIAGNOSIS — K219 Gastro-esophageal reflux disease without esophagitis: Secondary | ICD-10-CM | POA: Diagnosis not present

## 2015-01-17 DIAGNOSIS — C3490 Malignant neoplasm of unspecified part of unspecified bronchus or lung: Secondary | ICD-10-CM | POA: Diagnosis not present

## 2015-01-17 DIAGNOSIS — Z9889 Other specified postprocedural states: Secondary | ICD-10-CM | POA: Diagnosis not present

## 2015-01-18 ENCOUNTER — Ambulatory Visit: Payer: Medicare Other | Admitting: Radiation Oncology

## 2015-01-19 ENCOUNTER — Ambulatory Visit
Admission: RE | Admit: 2015-01-19 | Discharge: 2015-01-19 | Disposition: A | Discharge status: Home / Self Care | Payer: Medicare Other | Source: Ambulatory Visit | Attending: Radiation Oncology | Admitting: Radiation Oncology

## 2015-01-19 ENCOUNTER — Encounter: Payer: Self-pay | Admitting: Radiation Oncology

## 2015-01-19 VITALS — BP 104/70 | HR 84 | Temp 97.7°F | Ht 68.0 in | Wt 162.1 lb

## 2015-01-19 DIAGNOSIS — C3431 Malignant neoplasm of lower lobe, right bronchus or lung: Secondary | ICD-10-CM

## 2015-01-19 DIAGNOSIS — Z510 Encounter for antineoplastic radiation therapy: Secondary | ICD-10-CM | POA: Diagnosis not present

## 2015-01-19 DIAGNOSIS — K219 Gastro-esophageal reflux disease without esophagitis: Secondary | ICD-10-CM | POA: Diagnosis not present

## 2015-01-19 DIAGNOSIS — C3490 Malignant neoplasm of unspecified part of unspecified bronchus or lung: Secondary | ICD-10-CM | POA: Diagnosis not present

## 2015-01-19 DIAGNOSIS — Z8546 Personal history of malignant neoplasm of prostate: Secondary | ICD-10-CM | POA: Diagnosis not present

## 2015-01-19 DIAGNOSIS — I10 Essential (primary) hypertension: Secondary | ICD-10-CM | POA: Diagnosis not present

## 2015-01-19 DIAGNOSIS — Z9889 Other specified postprocedural states: Secondary | ICD-10-CM | POA: Diagnosis not present

## 2015-01-19 NOTE — Progress Notes (Signed)
  Radiation Oncology         [phone]     Name: Malik Zuniga MRN: [id number]   Date: 01/19/2015  DOB: October 08, 1945   Weekly Radiation Therapy Management    ICD-9-CM ICD-10-CM   1. Primary cancer of right lower lobe of lung (HCC) 162.5 C34.31     Current Dose: 40 Gy  Planned Dose:  50 Gy  Narrative The patient presents for routine under treatment assessment.  Malik Zuniga is here for his 4th RUL SBRT. He reports chronic pain in his back which he takes one '10mg'$  norco 5 times daily. He reports eating and drinking well, though states he could be drinking more water for hydration. He is able to care for himself, but does need assistance in bathing, and preparing meals at home. He denies shortness of breath when he exerts himself. He admits to occasional nausea, but denies any vomiting. He does admit to some mild depression, and feels it is related to his cancer diagnosis. He declines to meet a Education officer, museum at this time.   The patient is without complaint. Set-up films were reviewed. The chart was checked.  Physical Findings  vitals were not taken for this visit.. Weight essentially stable.  No significant changes. In wheelchair.  Impression The patient is tolerating radiation.  Plan Continue treatment as planned. Patient is scheduled to complete treatment next week.         Sheral Apley Tammi Klippel, M.D.  This document serves as a record of services personally performed by Tyler Pita, MD. It was created on his behalf by [name], a trained medical scribe. The creation of this record is based on the scribe's personal observations and the provider's statements to them. This document has been checked and approved by the attending provider.

## 2015-01-19 NOTE — Progress Notes (Signed)
Malik Zuniga is here for his 4th RUL SBRT. He reports chronic pain in his back which he takes one '10mg'$   norco 5 times daily. He reports eating and drinking well, though states he could be drinking more water for hydration. He is able to care for himself, but does need assistance in bathing, and preparing meals at home. He denies shortness of breath when he exerts himself. He admits to occasionaly nausea, but denies any vomiting. He does admit to some mild depression, and feels it is related to his cancer diagnosis. He declines to meet a Education officer, museum at this time.   BP 104/70 mmHg  Pulse 84  Temp(Src) 97.7 F (36.5 C)  Ht '5\' 8"'$  (1.727 m)  Wt 162 lb 1.6 oz (73.528 kg)  BMI 24.65 kg/m2   Wt Readings from Last 3 Encounters:  01/19/15 162 lb 1.6 oz (73.528 kg)  12/11/14 160 lb (72.576 kg)  12/01/14 160 lb (72.576 kg)

## 2015-01-22 ENCOUNTER — Ambulatory Visit
Admission: RE | Admit: 2015-01-22 | Discharge: 2015-01-22 | Disposition: A | Discharge status: Home / Self Care | Payer: Medicare Other | Source: Ambulatory Visit | Attending: Radiation Oncology | Admitting: Radiation Oncology

## 2015-01-22 ENCOUNTER — Telehealth: Payer: Self-pay | Admitting: Pulmonary Disease

## 2015-01-22 DIAGNOSIS — Z9889 Other specified postprocedural states: Secondary | ICD-10-CM | POA: Diagnosis not present

## 2015-01-22 DIAGNOSIS — Z510 Encounter for antineoplastic radiation therapy: Secondary | ICD-10-CM | POA: Diagnosis not present

## 2015-01-22 DIAGNOSIS — I10 Essential (primary) hypertension: Secondary | ICD-10-CM | POA: Diagnosis not present

## 2015-01-22 DIAGNOSIS — Z8546 Personal history of malignant neoplasm of prostate: Secondary | ICD-10-CM | POA: Diagnosis not present

## 2015-01-22 DIAGNOSIS — C3490 Malignant neoplasm of unspecified part of unspecified bronchus or lung: Secondary | ICD-10-CM | POA: Diagnosis not present

## 2015-01-22 DIAGNOSIS — K219 Gastro-esophageal reflux disease without esophagitis: Secondary | ICD-10-CM | POA: Diagnosis not present

## 2015-01-22 NOTE — Telephone Encounter (Signed)
Pt requesting pathology report of lung biopsy for insurance reasons.  Requesting this be mailed to him.  Verified address on file, has been mailed.  Nothing further needed.

## 2015-01-24 ENCOUNTER — Ambulatory Visit: Payer: Medicare Other | Admitting: Radiation Oncology

## 2015-01-26 ENCOUNTER — Ambulatory Visit: Payer: Self-pay | Admitting: *Deleted

## 2015-01-26 ENCOUNTER — Other Ambulatory Visit: Payer: Self-pay | Admitting: *Deleted

## 2015-01-26 NOTE — Patient Outreach (Signed)
[facility]) Care Management  01/26/2015  Malik Zuniga [birth year] [id number]  RN Health Coach telephone call to patient.  Hipaa compliance verified.  Patient has just finished his radiation therapy for ca of the lung. He needs assistance with his bathing and his wife Malik Zuniga prepares the meals . Patient is drinking the supplemental nutrition. He is waiting now to see what the results from the radiation is going to show.  Patient is using  Wheelchair.Patient is not having shortness of breath. Patient is still having moderate amount of back pain. Assessment: Patient will continue to benefit from Health Coach telephonic outreach for education and support for copd self management  Plan: RN Health Coach will follow up outreach call within a month RN Health Coach will send EMMI on Inhalers: Using them the right way RN Health Coach will send EMMI on [place] will send EMMI on Using Oxygen at home [place] will send EMMI on the Flu [place] will send patient a 2017 calendar [place] [facility] [phone]

## 2015-01-28 NOTE — Progress Notes (Signed)
  Radiation Oncology         [phone] ________________________________  Name: Malik Zuniga MRN: [id number]  Date: 01/11/2015  DOB: [birth year]  End of Treatment Note  DIAGNOSIS: The encounter diagnosis was Primary lung adenocarcinoma, unspecified laterality ([place]). [name] is a 69 y.o. 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 Primary lung adenocarcinoma, unspecified laterality ([place]) 162.9 C34.90        Indication for treatment:  Curative, Definitive SBRT       Radiation treatment dates:  01/11/2015  Site/dose:   The target was treated to 50 Gy in 5 fractions of 10 Gy  Beams/energy:   The patient was treated using stereotactic body radiotherapy according to a 3D conformal radiotherapy plan.  Volumetric arc fields and DCA fields were employed to deliver 6 MV X-rays.  Image guidance was performed with per fraction cone beam CT prior to treatment under personal MD supervision.  Immobilization was achieved using BodyFix Pillow.  Narrative: The patient tolerated radiation treatment relatively well.     Plan: The patient has completed radiation treatment. The patient will return to radiation oncology clinic for routine followup in one month. I advised them to call or return sooner if they have any questions or concerns related to their recovery or treatment. ________________________________  Sheral Apley. Tammi Klippel, M.D.

## 2015-01-31 ENCOUNTER — Encounter: Payer: Self-pay | Admitting: Adult Health

## 2015-01-31 ENCOUNTER — Ambulatory Visit (INDEPENDENT_AMBULATORY_CARE_PROVIDER_SITE_OTHER): Payer: Medicare Other | Admitting: Adult Health

## 2015-01-31 ENCOUNTER — Ambulatory Visit (INDEPENDENT_AMBULATORY_CARE_PROVIDER_SITE_OTHER)
Admission: RE | Admit: 2015-01-31 | Discharge: 2015-01-31 | Disposition: A | Discharge status: Home / Self Care | Payer: Medicare Other | Source: Ambulatory Visit | Attending: Adult Health | Admitting: Adult Health

## 2015-01-31 VITALS — BP 130/88 | HR 80 | Temp 97.5°F | Ht 70.0 in | Wt 160.0 lb

## 2015-01-31 DIAGNOSIS — I639 Cerebral infarction, unspecified: Secondary | ICD-10-CM

## 2015-01-31 DIAGNOSIS — R1310 Dysphagia, unspecified: Secondary | ICD-10-CM | POA: Diagnosis not present

## 2015-01-31 DIAGNOSIS — J439 Emphysema, unspecified: Secondary | ICD-10-CM | POA: Diagnosis not present

## 2015-01-31 DIAGNOSIS — C3431 Malignant neoplasm of lower lobe, right bronchus or lung: Secondary | ICD-10-CM

## 2015-01-31 DIAGNOSIS — R05 Cough: Secondary | ICD-10-CM | POA: Diagnosis not present

## 2015-01-31 MED ORDER — PREDNISONE 10 MG PO TABS: ORAL_TABLET | ORAL | Status: DC

## 2015-01-31 NOTE — Patient Instructions (Addendum)
Prednisone taper .  Mucinex DM Twice daily As needed  Cough.  GERD diet  No eating 3hr before bedtime.  Swallow precautions .  Follow up with Dr. Watt Climes if swallowing does not improve.  Please contact office for sooner follow up if symptoms do not Do not lie down after eating.  improve or worsen or seek emergency care  Follow up with Dr. [name] in 6 weeks As needed

## 2015-01-31 NOTE — Progress Notes (Signed)
Subjective:    Patient ID: Malik Zuniga, male    DOB: 1945/12/04, 69 y.o.   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 Consequently PET/CT was done on 11/16/14. This showed that the RLL nodule measured 1.3 cm with an SUV max equal to 3.5. CT biopsy was performed on 12/01/14 revealing adenocarcinoma.  PFT on 11/13/2014 showed a post bronchodilator FEV1 at 45%, ratio 45, FVC 74%, positive bronchodilator response.  01/31/2015 Acute OV  Pt presents for an acute ov .  Patient complains of prod cough with brown colored mucus, chest congestion, sinus pressure, SOB and wheezing for the last 6 weeks. He has been undergoing radiation therapy for a known adenocarcinoma of the right lower lung.  He finished treatment last week . He says that he has been on 3 different antibiotics, and 2 courses of prednisone without much relief  Denies any chest tightness, sinus drainage, fever, nausea or vomiting.  Does feel his swallowing is worse since XRT . No choking .    Past Medical History  Diagnosis Date  . Hyperlipidemia     takes Simvastatin daily  . Tremor   . History of prostate cancer   . On home O2   . Neuromuscular disorder ([place]) 1998    right carpal tunnel release  . Anemia associated with acute blood loss   . GERD (gastroesophageal reflux disease)     takes Omeprazole daily  . Hypertension     takes Metoprolol daily as well as Hyzaar  . Constipation     takes Colace daily as well as Miralax  . Anxiety     takes Valium daily  . Depression     takes Cymbalta daily  . Emphysema of lung  (HCC)     Albuterol as needed;Symbicort daily and Singulair at bedtime  . Emphysema   . Myocardial infarction ([place]) 04/1998  . Coronary artery disease   . Asthma   . Shortness of breath     with exertion  . History of bronchitis   . Aspiration pneumonia ([place]) 2010  . Headache, chronic daily   . History of migraine     last migraine a couple of days ago;takes Excedrin Migraine  . Chronic back pain     compression fracture  . History of colon polyps   . Mood change ([place])     after Brain surgery mood changed and was placed on Depakote  . Insomnia     takes Benadryl nightly  . Stroke [facility]) 1998    Brain Aneurysm  . Prostate cancer ([place]) 2004  . Lung cancer [facility])    Current Outpatient Prescriptions on File Prior to Visit  Medication Sig Dispense Refill  . ARIPiprazole (ABILIFY) 10 MG tablet Take 5 mg by mouth daily.     . Ascorbic Acid (VITAMIN C) 1000 MG tablet Take 1,000 mg by mouth daily.    Marland Kitchen aspirin-acetaminophen-caffeine (EXCEDRIN MIGRAINE) 250-250-65 MG per tablet Take 2 tablets by mouth every 6 (six) hours as needed. For headaches    . budesonide (PULMICORT) 0.25 MG/2ML nebulizer solution Take 4 mLs (0.5 mg total) by  nebulization 2 (two) times daily. 60 mL 3  . cetirizine (ZYRTEC) 10 MG tablet Take 1 tablet (10 mg total) by mouth daily. 30 tablet 11  . cholecalciferol (VITAMIN D) 1000 UNITS tablet Take 1,000 Units by mouth every morning.     . diazepam (VALIUM) 5 MG tablet Take 5 mg by mouth 3 (three) times daily. scheduled    . divalproex (DEPAKOTE) 500 MG 24 hr tablet Take 1,000 mg by mouth at bedtime.     . docusate sodium (COLACE) 100 MG capsule Take 200 mg by mouth at bedtime.     . DULoxetine (CYMBALTA) 60 MG capsule Take 60 mg by mouth daily.    . fluticasone (FLONASE) 50 MCG/ACT nasal spray PLACE 2 SPRAYS INTO BOTH NOSTRILS DAILY. (Patient taking differently: PLACE 2 SPRAYS INTO BOTH NOSTRILS DAILY as needed for allergies) 16 g 2  . HYDROcodone-acetaminophen (NORCO)  10-325 MG per tablet Take 1 tablet by mouth every 6 (six) hours as needed for moderate pain. for pain  0  . ipratropium-albuterol (DUONEB) 0.5-2.5 (3) MG/3ML SOLN Take 3 mLs by nebulization every 6 (six) hours. 360 mL 3  . losartan-hydrochlorothiazide (HYZAAR) 50-12.5 MG per tablet Take 1 tablet by mouth daily. 30 tablet 6  . metoprolol succinate (TOPROL-XL) 50 MG 24 hr tablet TAKE 1 TABLET (50 MG TOTAL) BY MOUTH EVERY MORNING. TAKE WITH OR IMMEDIATELY FOLLOWING A MEAL. 30 tablet 6  . montelukast (SINGULAIR) 10 MG tablet TAKE 1 TABLET BY MOUTH AT BEDTIME 30 tablet 6  . NITROSTAT 0.4 MG SL tablet PLACE 1 TABLET (0.4 MG TOTAL) UNDER THE TONGUE EVERY 5 (FIVE) MINUTES AS NEEDED FOR CHEST PAIN. 25 tablet 1  . Omega-3 Fatty Acids (FISH OIL) 1000 MG CAPS Take 1 capsule by mouth every morning.     Marland Kitchen omeprazole (PRILOSEC) 40 MG capsule Take 40 mg by mouth 2 (two) times daily.     . polyethylene glycol (MIRALAX / GLYCOLAX) packet Take 17 g by mouth 3 (three) times daily as needed for moderate constipation. For constipation    . primidone (MYSOLINE) 250 MG tablet TAKE 1 TABLET (250 MG TOTAL) BY MOUTH 2 (TWO) TIMES DAILY. 60 tablet 6  . promethazine (PHENERGAN) 25 MG tablet Take 25 mg by mouth every 6 (six) hours as needed. for nausea  1  . simvastatin (ZOCOR) 40 MG tablet TAKE 1 TABLET (40 MG TOTAL) BY MOUTH EVERY EVENING. 90 tablet 1  . tiZANidine (ZANAFLEX) 4 MG tablet TAKE 1 TABLET BY MOUTH EVERY 8 HOURS AS NEEDED FOR MUSCLE SPASMS. 90 tablet 1  . Zinc 50 MG TABS Take 50 mg by mouth daily.      No current facility-administered medications on file prior to visit.     Review of Systems .Constitutional:   No  weight loss, night sweats,  Fevers, chills, + fatigue, or  lassitude.  HEENT:   No headaches,  Difficulty swallowing,  Tooth/dental problems, or  Sore throat,                No sneezing, itching, ear ache,  +nasal congestion, post nasal drip,   CV:  No chest pain,  Orthopnea, PND, swelling in  lower extremities, anasarca, dizziness, palpitations, syncope.   GI  No heartburn, indigestion, abdominal pain, nausea, vomiting, diarrhea, change in bowel habits, loss of appetite, bloody stools.   Resp:    No chest wall deformity  Skin: no rash or lesions.  GU: no dysuria, change in color of urine, no urgency or frequency.  No flank pain, no hematuria   MS:  No joint pain or swelling.  No decreased range of motion.  No back pain.  Psych:  No change in mood or affect. No depression or anxiety.  No memory loss.         Objective:   Physical Exam GEN: A/Ox3; pleasant , NAD, frail and elderly in WC  Filed Vitals:   01/31/15 1022  BP: 130/88  Pulse: 80  Temp: 97.5 F (36.4 C)  TempSrc: Oral  Height: '5\' 10"'$  (1.778 m)  Weight: 160 lb (72.576 kg)  SpO2: 93%     HEENT:  [place]/AT,  EACs-clear, TMs-wnl, NOSE-clear, THROAT-clear, no lesions, no postnasal drip or exudate noted.   NECK:  Supple w/ fair ROM; no JVD; normal carotid impulses w/o bruits; no thyromegaly or nodules palpated; no lymphadenopathy.  RESP  coarse rhonchi bilaterally no accessory muscle use, no dullness to percussion  CARD:  RRR, no m/r/g  , no peripheral edema, pulses intact, no cyanosis or clubbing.  GI:   Soft & nt; nml bowel sounds; no organomegaly or masses detected.  Musco: Warm bil, no deformities or joint swelling noted.   Neuro: alert, no focal deficits noted.    Skin: Warm, no lesions or rashes    CXR 01/31/15  No evidence of pneumonia, collapse or effusion.  Contracting density in the right lower lung, now approximately 1 cm in size, consistent with a mass lesion undergoing radiation treatment.     Assessment & Plan:

## 2015-02-01 DIAGNOSIS — R1310 Dysphagia, unspecified: Secondary | ICD-10-CM | POA: Insufficient documentation

## 2015-02-01 NOTE — Assessment & Plan Note (Signed)
cxr shows regressing mass s/p XRT  Cont to follow   Plan  Prednisone taper .  Mucinex DM Twice daily As needed  Cough.  GERD diet  No eating 3hr before bedtime.  Swallow precautions .  Follow up with Dr. Watt Climes if swallowing does not improve.  Please contact office for sooner follow up if symptoms do not Do not lie down after eating.  improve or worsen or seek emergency care  Follow up with Dr. [name] in 6 weeks As needed

## 2015-02-01 NOTE — Assessment & Plan Note (Signed)
Slow to resolve flare with hx of aspiration PNA  Concerned for possible radiation pneumonitis however xray with no sign of PNA or pneumonitis changes.  Hold on additional abx for now  Try to maximize pulmonary hygiene   Plan  Prednisone taper .  Mucinex DM Twice daily As needed  Cough.  GERD diet  No eating 3hr before bedtime.  Swallow precautions .  Follow up with Dr. Watt Climes if swallowing does not improve.  Please contact office for sooner follow up if symptoms do not Do not lie down after eating.  improve or worsen or seek emergency care  Follow up with Dr. [name] in 6 weeks As needed

## 2015-02-01 NOTE — Assessment & Plan Note (Signed)
Worsening Dysphagia - ? XRT related  Cont on PPI  paln   GERD diet  No eating 3hr before bedtime.  Swallow precautions .  Follow up with Dr. Watt Climes if swallowing does not improve.  Please contact office for sooner follow up if symptoms do not Do not lie down after eating.  improve or worsen or seek emergency care  Follow up with Dr. [name] in 6 weeks As needed

## 2015-02-14 ENCOUNTER — Telehealth: Payer: Self-pay | Admitting: Pulmonary Disease

## 2015-02-14 MED ORDER — PREDNISONE 10 MG PO TABS: ORAL_TABLET | ORAL | Status: DC

## 2015-02-14 NOTE — Telephone Encounter (Signed)
Spoke with the pt  He is requesting pred taper  It was very hard to understand what he was saying over the phone  He states he is SOB "sometimes" He denied cough  He does c/o congestion in chest with little to no mucus production  Pt last seen by TP on 01/31/15 and next ov 04/16/15 with Dr [name]  Please advise thanks! Allergies  Allergen Reactions  . Lisinopril Swelling    ANGIOEDEMA  . Lamictal [Lamotrigine] Other (See Comments)    Weakness/difficulty swallowing  . Ambien [Zolpidem] Other (See Comments)    Unknown reaction

## 2015-02-14 NOTE — Telephone Encounter (Signed)
Called spoke with pt spouse. She reports pt is not so SOB he is unable to talk. RX for pred sent in. Nothing further needed

## 2015-02-14 NOTE — Telephone Encounter (Signed)
If he is too short of breath to talk then go to ER. Otherwise pred taper: Take '40mg'$  po daily for 3 days, then take '30mg'$  po daily for 3 days, then take '20mg'$  po daily for two days, then take '10mg'$  po daily for 2 days

## 2015-02-15 DIAGNOSIS — S22000D Wedge compression fracture of unspecified thoracic vertebra, subsequent encounter for fracture with routine healing: Secondary | ICD-10-CM | POA: Diagnosis not present

## 2015-02-26 ENCOUNTER — Other Ambulatory Visit: Payer: Self-pay | Admitting: Interventional Cardiology

## 2015-02-28 ENCOUNTER — Telehealth: Payer: Self-pay | Admitting: Pulmonary Disease

## 2015-02-28 NOTE — Telephone Encounter (Signed)
lmtcb x1 for pt. 

## 2015-03-01 ENCOUNTER — Telehealth: Payer: Self-pay | Admitting: Pulmonary Disease

## 2015-03-01 MED ORDER — PREDNISONE 10 MG PO TABS
ORAL_TABLET | ORAL | Status: DC
Start: 2015-03-01 — End: 2015-03-13

## 2015-03-01 NOTE — Telephone Encounter (Signed)
Ok to refill the last script for prednisone

## 2015-03-01 NOTE — Telephone Encounter (Signed)
Spoke with pt. He is requesting a refill on prednisone. This medication was last given to him on 02/14/15 by BQ. Reports coughing with production of dark mucus. Denies chest tightness, wheezing, SOB or fever. Onset was 5 days ago.  Allergies  Allergen Reactions  . Lisinopril Swelling    ANGIOEDEMA  . Lamictal [Lamotrigine] Other (See Comments)    Weakness/difficulty swallowing  . Ambien [Zolpidem] Other (See Comments)    Unknown reaction   Current Outpatient Prescriptions on File Prior to Visit  Medication Sig Dispense Refill  . ARIPiprazole (ABILIFY) 10 MG tablet Take 5 mg by mouth daily.     . Ascorbic Acid (VITAMIN C) 1000 MG tablet Take 1,000 mg by mouth daily.    Marland Kitchen aspirin-acetaminophen-caffeine (EXCEDRIN MIGRAINE) 250-250-65 MG per tablet Take 2 tablets by mouth every 6 (six) hours as needed. For headaches    . budesonide (PULMICORT) 0.25 MG/2ML nebulizer solution Take 4 mLs (0.5 mg total) by nebulization 2 (two) times daily. 60 mL 3  . cetirizine (ZYRTEC) 10 MG tablet Take 1 tablet (10 mg total) by mouth daily. 30 tablet 11  . cholecalciferol (VITAMIN D) 1000 UNITS tablet Take 1,000 Units by mouth every morning.     . diazepam (VALIUM) 5 MG tablet Take 5 mg by mouth 3 (three) times daily. scheduled    . divalproex (DEPAKOTE) 500 MG 24 hr tablet Take 1,000 mg by mouth at bedtime.     . docusate sodium (COLACE) 100 MG capsule Take 200 mg by mouth at bedtime.     . DULoxetine (CYMBALTA) 60 MG capsule Take 60 mg by mouth daily.    . fluticasone (FLONASE) 50 MCG/ACT nasal spray PLACE 2 SPRAYS INTO BOTH NOSTRILS DAILY. (Patient taking differently: PLACE 2 SPRAYS INTO BOTH NOSTRILS DAILY as needed for allergies) 16 g 2  . HYDROcodone-acetaminophen (NORCO) 10-325 MG per tablet Take 1 tablet by mouth every 6 (six) hours as needed for moderate pain. for pain  0  . ipratropium-albuterol (DUONEB) 0.5-2.5 (3) MG/3ML SOLN Take 3 mLs by nebulization every 6 (six) hours. 360 mL 3  .  losartan-hydrochlorothiazide (HYZAAR) 50-12.5 MG tablet TAKE 1 TABLET BY MOUTH DAILY. 30 tablet 1  . metoprolol succinate (TOPROL-XL) 50 MG 24 hr tablet TAKE 1 TABLET (50 MG TOTAL) BY MOUTH EVERY MORNING. TAKE WITH OR IMMEDIATELY FOLLOWING A MEAL. 30 tablet 6  . montelukast (SINGULAIR) 10 MG tablet TAKE 1 TABLET BY MOUTH AT BEDTIME 30 tablet 6  . NITROSTAT 0.4 MG SL tablet PLACE 1 TABLET (0.4 MG TOTAL) UNDER THE TONGUE EVERY 5 (FIVE) MINUTES AS NEEDED FOR CHEST PAIN. 25 tablet 1  . Omega-3 Fatty Acids (FISH OIL) 1000 MG CAPS Take 1 capsule by mouth every morning.     Marland Kitchen omeprazole (PRILOSEC) 40 MG capsule Take 40 mg by mouth 2 (two) times daily.     . polyethylene glycol (MIRALAX / GLYCOLAX) packet Take 17 g by mouth 3 (three) times daily as needed for moderate constipation. For constipation    . predniSONE (DELTASONE) 10 MG tablet Take '40mg'$  po daily for 3 days, then take '30mg'$  po daily for 3 days, then take '20mg'$  po daily for two days, then take '10mg'$  po daily for 2 days 27 tablet 0  . primidone (MYSOLINE) 250 MG tablet TAKE 1 TABLET (250 MG TOTAL) BY MOUTH 2 (TWO) TIMES DAILY. 60 tablet 6  . promethazine (PHENERGAN) 25 MG tablet Take 25 mg by mouth every 6 (six) hours as needed. for nausea  1  .  simvastatin (ZOCOR) 40 MG tablet TAKE 1 TABLET (40 MG TOTAL) BY MOUTH EVERY EVENING. 90 tablet 1  . tiZANidine (ZANAFLEX) 4 MG tablet TAKE 1 TABLET BY MOUTH EVERY 8 HOURS AS NEEDED FOR MUSCLE SPASMS. 90 tablet 1  . Zinc 50 MG TABS Take 50 mg by mouth daily.      No current facility-administered medications on file prior to visit.   CY - please advise as BQ is working night shift. Thanks.

## 2015-03-01 NOTE — Telephone Encounter (Signed)
Opened by mistake.  Patient was told prednisone was sent to his pharm from prev msg.

## 2015-03-01 NOTE — Telephone Encounter (Signed)
LMTC x 1 for pt. Rx for Prednisone sent to pharmacy.

## 2015-03-02 ENCOUNTER — Ambulatory Visit: Payer: Self-pay | Admitting: *Deleted

## 2015-03-06 ENCOUNTER — Telehealth: Payer: Self-pay | Admitting: Pulmonary Disease

## 2015-03-06 ENCOUNTER — Other Ambulatory Visit: Payer: Self-pay | Admitting: *Deleted

## 2015-03-06 NOTE — Telephone Encounter (Signed)
Called and spoke to pt. Pt c/o chest congestion and nonprod cough with fatigue. Pt denies SOB, f/c/s, CP/tightness. Appt made with MW on 1.25.17. Pt verbalized understanding and denied any further questions or concerns at this time.

## 2015-03-07 ENCOUNTER — Ambulatory Visit (INDEPENDENT_AMBULATORY_CARE_PROVIDER_SITE_OTHER): Payer: Medicare Other | Admitting: Internal Medicine

## 2015-03-07 ENCOUNTER — Encounter: Payer: Self-pay | Admitting: Internal Medicine

## 2015-03-07 VITALS — BP 118/80 | HR 89 | Temp 97.8°F

## 2015-03-07 DIAGNOSIS — J449 Chronic obstructive pulmonary disease, unspecified: Secondary | ICD-10-CM | POA: Diagnosis not present

## 2015-03-07 DIAGNOSIS — C3431 Malignant neoplasm of lower lobe, right bronchus or lung: Secondary | ICD-10-CM | POA: Diagnosis not present

## 2015-03-07 DIAGNOSIS — F331 Major depressive disorder, recurrent, moderate: Secondary | ICD-10-CM | POA: Diagnosis not present

## 2015-03-07 DIAGNOSIS — R05 Cough: Secondary | ICD-10-CM

## 2015-03-07 DIAGNOSIS — R059 Cough, unspecified: Secondary | ICD-10-CM

## 2015-03-07 NOTE — Progress Notes (Signed)
Subjective:    Patient ID: Malik Zuniga, male    DOB: [birth year] .   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 Consequently PET/CT was done on 11/16/14. This showed that the RLL nodule measured 1.3 cm with an SUV max equal to 3.5. CT biopsy was performed on 12/01/14 revealing adenocarcinoma.  PFT on 11/13/2014 showed a post bronchodilator FEV1 at 45%, ratio 45, FVC 74%, positive bronchodilator response.    History of Present Illness  01/31/2015 NP Acute OV  Pt presents for an acute ov .  Patient complains of prod cough with brown colored mucus, chest congestion, sinus pressure, SOB and wheezing for the last 6 weeks. He has been undergoing radiation therapy for a known adenocarcinoma of the right lower lung.  He finished treatment last week . He says that he has been on 3 different antibiotics, and 2 courses of prednisone without much relief  Denies any chest tightness, sinus drainage, fever, nausea or vomiting.  Does feel his swallowing is worse since XRT . No choking  rec Prednisone taper .  Mucinex DM Twice daily As needed  Cough.  GERD diet  No eating 3hr before bedtime.  Swallow precautions .  Follow up with Dr. Watt Climes if swallowing does not improve  > did not do Please contact office for sooner follow up if symptoms do not Do not lie down after eating.  improve or worsen or seek emergency care     03/07/2015  Extended acute  ov/[name] re: gradually worse cough x 35m on ppi bid but not ac/ prednisone 20 mg per day but tapering off while on maint rx with  duoneb/pulmocort  Chief Complaint  Patient presents with  . Acute Visit    Pt c/o cough progressively worsening since end of Oct 2016.  Cough is currently non prod. He states has no energy and "feels awful".   Sleeping chronically at 45 degrees/  cough is worse at hs and also p eating and occ coughs up food   No obvious day to day or daytime variability or assoc   cp or chest tightness, subjective wheeze or overt sinus or hb symptoms. No unusual exp hx or h/o childhood pna/ asthma or knowledge of premature birth.  Sleeping ok without nocturnal  or early am exacerbation  of respiratory  c/o's or need for noct saba. Also denies any obvious fluctuation of symptoms with weather or environmental changes or other aggravating or alleviating factors except as outlined above   Current Medications, Allergies, Complete Past Medical History, Past Surgical History, Family History, and Social History were reviewed in [employer].  ROS  The following are not active complaints unless bolded sore throat, dysphagia, dental problems, itching, sneezing,  nasal congestion or excess/ purulent secretions, ear ache,   fever, chills, sweats, unintended wt loss, classically pleuritic or exertional cp, hemoptysis,  orthopnea pnd or leg swelling, presyncope, palpitations, abdominal pain, anorexia, nausea, vomiting, diarrhea  or change in bowel or bladder habits, change in stools or urine, dysuria,hematuria,  rash, arthralgias, visual complaints,  headache, numbness, weakness or ataxia or problems with walking or coordination,  change in mood/affect or memory.           Objective:   Physical Exam  GEN: A/Ox3; pleasant , NAD, frail and elderly in WC   Wt Readings from Last 3 Encounters:  01/31/15 160 lb (72.576 kg)  01/19/15 162 lb 1.6 oz (73.528 kg)  12/11/14 160 lb (72.576 kg)    Vital signs reviewed    HEENT:  [place]/AT,  EACs-clear, TMs-wnl, NOSE-clear, THROAT-clear, no lesions, no  postnasal drip or exudate noted.   NECK:  Supple w/ fair ROM; no JVD; normal carotid impulses w/o bruits; no thyromegaly or nodules palpated; no lymphadenopathy.  RESP  Coarse insp and exp  rhonchi bilaterally  no accessory muscle use, no dullness to percussion  CARD:  RRR, no m/r/g  , no peripheral edema, pulses intact, no cyanosis or clubbing.  GI:   Soft & nt; nml bowel sounds; no organomegaly or masses detected.  Musco: Warm bil, no deformities or joint swelling noted.   Neuro: alert, no focal deficits noted.    Skin: Warm, no lesions or rashes    CXR 01/31/15  No evidence of pneumonia, collapse or effusion. Contracting density in the right lower lung, now approximately 1 cm in size, consistent with a mass lesion undergoing radiation treatment.     Assessment & Plan:

## 2015-03-07 NOTE — Patient Instructions (Addendum)
Prilosec 20 mg Take 30- 60 min before your first and last meals of the day    GERD (REFLUX)  is an extremely common cause of respiratory symptoms just like yours , many times with no obvious heartburn at all.    It can be treated with medication, but also with lifestyle changes including elevation of the head of your bed (ideally with 6 inch  bed blocks),  Smoking cessation, avoidance of late meals, excessive alcohol, and avoid fatty foods, chocolate, peppermint, colas, red wine, and acidic juices such as orange juice.  NO MINT OR MENTHOL PRODUCTS SO NO COUGH DROPS  USE SUGARLESS CANDY INSTEAD (Jolley ranchers or Stover's or Life Savers) or even ice chips will also do - the key is to swallow to prevent all throat clearing. NO OIL BASED VITAMINS - use powdered substitutes.  For cough > delsym or mucinex dm liquid     See [facility] asap

## 2015-03-08 ENCOUNTER — Ambulatory Visit
Admission: RE | Admit: 2015-03-08 | Discharge: 2015-03-08 | Disposition: A | Discharge status: Home / Self Care | Payer: Medicare Other | Source: Ambulatory Visit | Attending: Radiation Oncology | Admitting: Radiation Oncology

## 2015-03-08 ENCOUNTER — Encounter: Payer: Self-pay | Admitting: Radiation Oncology

## 2015-03-08 ENCOUNTER — Encounter: Payer: Self-pay | Admitting: Internal Medicine

## 2015-03-08 VITALS — BP 99/66 | HR 94 | Resp 16

## 2015-03-08 DIAGNOSIS — R05 Cough: Secondary | ICD-10-CM | POA: Insufficient documentation

## 2015-03-08 DIAGNOSIS — R059 Cough, unspecified: Secondary | ICD-10-CM | POA: Insufficient documentation

## 2015-03-08 DIAGNOSIS — C3431 Malignant neoplasm of lower lobe, right bronchus or lung: Secondary | ICD-10-CM

## 2015-03-08 MED ORDER — BENZONATATE 100 MG PO CAPS: 100.0000 mg | ORAL_CAPSULE | Freq: Three times a day (TID) | ORAL | Status: DC | PRN

## 2015-03-08 NOTE — Progress Notes (Signed)
BP low. Reports he has been "fighting a cold since October." Reports a dry cough. Reports SOB with exertion. Reports difficulty associated with swallowing but, no pain. Reports fatigue and low energy. Weight loss suspected but, patient refused to be weighed today. Wife confirms poor appetite. Reports regular headaches in the morning and evening for which resolve with OTC tylenol. Reports nausea is managed with phenergan.   BP 99/66 mmHg  Pulse 94  Resp 16  Wt   SpO2 93% Wt Readings from Last 3 Encounters:  01/31/15 160 lb (72.576 kg)  01/19/15 162 lb 1.6 oz (73.528 kg)  12/11/14 160 lb (72.576 kg)

## 2015-03-08 NOTE — Progress Notes (Signed)
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  Interval Since Last Radiation:  1 month; 01/22/2015   Narrative:  The patient returns today for routine follow-up.  He states that he is hopeful to find an option to relieve his persistent cough that has been present for 3 months. He states that he's not having any mucous production or hemoptysis. He denies fevers, chills, or any shortness of breath at rest but has noticed this with exertion. He denies any acute changes and states that this is stable. He continues with fatigue but states that this has improved since radiation completed. He continues to use phenergan as needed for nausea, and he continues to use tylenol for headaches. He feels as though he is losing weight, and though he doesn't weigh himself, he can tell that his clothes are fitting more loosely. He feels as though he would be able to eat more if he could have better control of his cough. A complete review of systems is otherwise negative.                                ALLERGIES:  is allergic to lisinopril; lamictal; and ambien.  Meds: Current Outpatient Prescriptions  Medication Sig Dispense Refill  . ARIPiprazole (ABILIFY) 10 MG tablet Take 5 mg by mouth daily.     . Ascorbic Acid (VITAMIN C) 1000 MG tablet Take 1,000 mg by mouth daily.    Marland Kitchen aspirin-acetaminophen-caffeine (EXCEDRIN MIGRAINE) 250-250-65 MG per tablet Take 2 tablets by mouth every 6 (six) hours as needed. For headaches    . budesonide (PULMICORT) 0.25 MG/2ML nebulizer solution Take 4 mLs (0.5 mg total) by nebulization 2 (two) times daily. 60 mL 3  . cetirizine (ZYRTEC) 10  MG tablet Take 1 tablet (10 mg total) by mouth daily. 30 tablet 11  . cholecalciferol (VITAMIN D) 1000 UNITS tablet Take 1,000 Units by mouth every morning.     . diazepam (VALIUM) 5 MG tablet Take 5 mg by mouth 3 (three) times daily. scheduled    . divalproex (DEPAKOTE) 500 MG 24 hr tablet Take 1,000 mg by mouth at bedtime.     . docusate sodium (COLACE) 100 MG capsule Take 200 mg by mouth at bedtime.     . DULoxetine (CYMBALTA) 60 MG capsule Take 60 mg by mouth daily.    . fluticasone (FLONASE) 50 MCG/ACT nasal spray PLACE 2 SPRAYS INTO BOTH NOSTRILS DAILY. (Patient taking differently: PLACE 2 SPRAYS INTO BOTH NOSTRILS DAILY as needed for allergies) 16 g 2  . HYDROcodone-acetaminophen (NORCO) 10-325 MG per tablet Take 1 tablet by mouth every 6 (six) hours as needed for moderate pain. for pain  0  . ipratropium-albuterol (DUONEB) 0.5-2.5 (3) MG/3ML SOLN Take 3 mLs by nebulization every 6 (six) hours. 360 mL 3  . losartan-hydrochlorothiazide (HYZAAR) 50-12.5 MG tablet TAKE 1 TABLET BY MOUTH DAILY. 30 tablet 1  . metoprolol succinate (TOPROL-XL) 50 MG 24 hr tablet TAKE 1 TABLET (50 MG TOTAL) BY MOUTH EVERY MORNING. TAKE WITH OR IMMEDIATELY FOLLOWING A MEAL. 30 tablet 6  .  montelukast (SINGULAIR) 10 MG tablet TAKE 1 TABLET BY MOUTH AT BEDTIME 30 tablet 6  . NITROSTAT 0.4 MG SL tablet PLACE 1 TABLET (0.4 MG TOTAL) UNDER THE TONGUE EVERY 5 (FIVE) MINUTES AS NEEDED FOR CHEST PAIN. 25 tablet 1  . omeprazole (PRILOSEC) 40 MG capsule Take 40 mg by mouth 2 (two) times daily.     . polyethylene glycol (MIRALAX / GLYCOLAX) packet Take 17 g by mouth 3 (three) times daily as needed for moderate constipation. For constipation    . predniSONE (DELTASONE) 10 MG tablet Take '40mg'$  po daily for 3 days, then take '30mg'$  po daily for 3 days, then take '20mg'$  po daily for two days, then take '10mg'$  po daily for 2 days 27 tablet 0  . primidone (MYSOLINE) 250 MG tablet TAKE 1 TABLET (250 MG TOTAL) BY MOUTH 2 (TWO) TIMES DAILY. 60  tablet 6  . promethazine (PHENERGAN) 25 MG tablet Take 25 mg by mouth every 6 (six) hours as needed. for nausea  1  . simvastatin (ZOCOR) 40 MG tablet TAKE 1 TABLET (40 MG TOTAL) BY MOUTH EVERY EVENING. 90 tablet 1  . tiZANidine (ZANAFLEX) 4 MG tablet TAKE 1 TABLET BY MOUTH EVERY 8 HOURS AS NEEDED FOR MUSCLE SPASMS. 90 tablet 1  . Zinc 50 MG TABS Take 50 mg by mouth daily.     . benzonatate (TESSALON) 100 MG capsule Take 1 capsule (100 mg total) by mouth 3 (three) times daily as needed for cough. 90 capsule 1   No current facility-administered medications for this encounter.    Physical Findings:  blood pressure is 99/66 and his pulse is 94. His respiration is 16 and oxygen saturation is 93%.   Pain scale 0/10 In general this is a chronically ill appearing caucasian male in no acute distress. He's alert and oriented x4 and appropriate throughout the exam. Cardiopulmonary assessment is negative for distress and reveals normal effort. Skin is dry and intact without excoriations or lesions.   Lab Findings: Lab Results  Component Value Date   WBC 6.2 12/01/2014   HGB 12.8* 12/01/2014   HCT 37.8* 12/01/2014   PLT 293 12/01/2014   PLT 329 12/10/2009    Lab Results  Component Value Date   NA 130* 11/15/2014   K 5.7* 11/15/2014   CO2 29 11/15/2014   GLUCOSE 89 11/15/2014   GLUCOSE 88 12/10/2009   BUN 15 11/15/2014   CREATININE 0.86 11/15/2014   CREATININE 0.65 10/11/2013   BILITOT 0.3 11/15/2014   ALKPHOS 63 11/15/2014   AST 21 11/15/2014   ALT 13 11/15/2014   PROT 7.4 11/15/2014   ALBUMIN 4.1 11/15/2014   CALCIUM 9.8 11/15/2014   ANIONGAP 18* 10/27/2013    Radiographic Findings: No results found.  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 We will proceed with a restaging CT scan in one month's time and meet  back to discuss the results when available. With his chronic cough, we discussed the rationale for using his incentive spirometer at home, continuing his inhaler for COPD, and discussed the use of tessalon pearls for cough. He will try this for the interim and notify us if his symptoms are not improving with the tessalon pearls.   The above documentation reflects my direct findings during this shared patient visit. Please see the separate note by Dr.  Tammi Klippel on this date for the remainder of the patient's plan of care.  Carola Rhine, PAC   This document serves as a record of services personally performed by Waymond Cera and  Tyler Pita, MD. It was created on their behalf by Jenell Milliner, a trained medical scribe. The creation of this record is based on the scribe's personal observations and the provider's statements to them. This document has been checked and approved by the attending provider.

## 2015-03-09 ENCOUNTER — Telehealth: Payer: Self-pay | Admitting: *Deleted

## 2015-03-09 NOTE — Telephone Encounter (Signed)
CALLED PATIENT TO INFORM OF CT AND FU, SPOKE WITH PATIENT AND HE IS AWARE OF THESE APPTS.

## 2015-03-11 ENCOUNTER — Encounter: Payer: Self-pay | Admitting: Internal Medicine

## 2015-03-11 NOTE — Assessment & Plan Note (Addendum)
11/25/13 ST eval rec Regular;Thin liquid  Liquid Administration via: Cup;Straw Medication Administration: Whole meds with liquid Supervision: Patient able to self feed Compensations: Slow rate;Small sips/bites;Follow solids with liquid    No f/u in over a year and having more trouble with cough p meals, coughing up meals, mild dsyphagia > rec max gerd rx and f/u ST/ Dr Watt Climes  I had an extended discussion with the patient/fm  reviewing all relevant studies completed to date and  lasting 15 to 20 minutes of a 25 minute visit    Each maintenance medication was reviewed in detail including most importantly the difference between maintenance and prns and under what circumstances the prns are to be triggered using an action plan format that is not reflected in the computer generated alphabetically organized AVS.    Please see instructions for details which were reviewed in writing and the patient given a copy highlighting the part that I personally wrote and discussed at today's ov.

## 2015-03-11 NOTE — Assessment & Plan Note (Signed)
First discovered on Low Dose Cancer Screen 2016 Follow up PET 2016 worrisome for malignancy October 2016 transthoracic needle aspiration> adenocarcinoma > radiation rx  Indication for treatment: Curative, Definitive SBRT  Radiation treatment dates: 01/11/2015 Site/dose: The target was treated to 50 Gy in 5 fractions of 10 Gy Beams/energy: The patient was treated using stereotactic body radiotherapy according to a 3D conformal radiotherapy plan. Volumetric arc fields and DCA fields were employed to deliver 6 MV X-rays. Image guidance was performed with per fraction cone beam CT prior to treatment under personal MD supervision. Immobilization was achieved using BodyFix Pillow.  F/u by oncology planned

## 2015-03-11 NOTE — Assessment & Plan Note (Signed)
-   Malik Zuniga 04/2009:  FEV1 1.65 (47%), FEV1% 49  - PFT's  11/13/14  FEV1 1.56 (45 % ) ratio 45  p 38 % improvement from saba with DLCO  37 % corrects to 44 % for alv volume    Presently on duoneb qid and pulmocort bid > no change in rx needed

## 2015-03-12 ENCOUNTER — Encounter: Payer: Self-pay | Admitting: *Deleted

## 2015-03-12 NOTE — Patient Outreach (Signed)
[facility]) Care Management  03/02/2015 Late entry   [name] 06-25-1945 [id number]  RN Health Coach telephone call to patient.  Hipaa compliance verified. RN Health Coach spoke with patient wife [name]. The patient is eating and using supplemental nutrition. His appetite is better. He is having occasional nausea. Per wife he is coughing a lot. The sputum  is a little clearer.  Per wife his last weight was on  01/19/2015.He weighed 162 pounds. Per wife he needs assistance with his bathing . Per wife the nutritional coupons that RN Health Coach sends helps a lot with the cost. Per wife patient had went back to physician because he was still having a productive cough and more medication prescribed.  Per wife the patient next appointment with the pulmonologist is 03/07/2015. RN Health Coach explained that the patient needs to be sure and take all medication as prescribed.  RN explained that if patient still has a persistent cough that they need to made his Dr aware.  Assessment Patient is still requiring supplemental nutrition Patient still has a cough Patient will still benefits from the [place] telephonic outreach for education and support for COPD self management  [place] will  send patient additional nutritional coupons [place] will send [facility] information on Pneumonia [place] will send [facility] information on Preventing falls since patient is weak RN Health Coach will send [facility] information on [place] will follow up outreach within a month   [place] Management [phone]

## 2015-03-13 ENCOUNTER — Telehealth: Payer: Self-pay | Admitting: Pulmonary Disease

## 2015-03-13 MED ORDER — PREDNISONE 10 MG PO TABS: ORAL_TABLET | ORAL | Status: DC

## 2015-03-13 NOTE — Telephone Encounter (Signed)
Per 03/07/15 OV with MW: Patient Instructions     Prilosec 20 mg Take 30- 60 min before your first and last meals of the day   GERD (REFLUX) is an extremely common cause of respiratory symptoms just like yours , many times with no obvious heartburn at all.   It can be treated with medication, but also with lifestyle changes including elevation of the head of your bed (ideally with 6 inch bed blocks), Smoking cessation, avoidance of late meals, excessive alcohol, and avoid fatty foods, chocolate, peppermint, colas, red wine, and acidic juices such as orange juice.  NO MINT OR MENTHOL PRODUCTS SO NO COUGH DROPS  USE SUGARLESS CANDY INSTEAD (Jolley ranchers or Stover's or Life Savers) or even ice chips will also do - the key is to swallow to prevent all throat clearing. NO OIL BASED VITAMINS - use powdered substitutes.  For cough > delsym or mucinex dm liquid    See [facility] asap   --  Called spoke with pt. He reports he is still having a dry cough, nasal cong, chest cong, PND, wheezing/chest tx (but no more than normal). He wants prednisone called in. Please advise MW thanks

## 2015-03-13 NOTE — Telephone Encounter (Signed)
Called spoke with pt. Aware of recs below. Nothing further needed and RX sent in.

## 2015-03-13 NOTE — Telephone Encounter (Signed)
Prednisone 10 mg take  4 each am x 2 days,   2 each am x 2 days,  1 each am x 2 days and stop  

## 2015-03-30 ENCOUNTER — Ambulatory Visit: Payer: Self-pay | Admitting: *Deleted

## 2015-03-30 ENCOUNTER — Other Ambulatory Visit: Payer: Self-pay | Admitting: *Deleted

## 2015-03-30 NOTE — Patient Outreach (Signed)
[facility] ([facility]) Care Management  [facility] Care Manager  03/30/2015   Malik Zuniga 10/09/1945 [id number]  Subjective: RN Health Coach telephone call to patient.  Hipaa compliance verified. Per patient his coughing is a little better with the tessalon pearls medication. Patient stated his appetite is still very poor and that he does get choked sometimes. Patient requested more coupons if available. Per patient he is done with the radiation therapy. Per patient he goes for a scan on 04/16/2015. Patient is very short of breath with minimal exertion.   Objective:   Current Medications:  Current Outpatient Prescriptions  Medication Sig Dispense Refill  . ARIPiprazole (ABILIFY) 10 MG tablet Take 5 mg by mouth daily.     . Ascorbic Acid (VITAMIN C) 1000 MG tablet Take 1,000 mg by mouth daily.    . aspirin-acetaminophen-caffeine (EXCEDRIN MIGRAINE) 250-250-65 MG per tablet Take 2 tablets by mouth every 6 (six) hours as needed. For headaches    . benzonatate (TESSALON) 100 MG capsule Take 1 capsule (100 mg total) by mouth 3 (three) times daily as needed for cough. 90 capsule 1  . budesonide (PULMICORT) 0.25 MG/2ML nebulizer solution Take 4 mLs (0.5 mg total) by nebulization 2 (two) times daily. 60 mL 3  . cetirizine (ZYRTEC) 10 MG tablet Take 1 tablet (10 mg total) by mouth daily. 30 tablet 11  . cholecalciferol (VITAMIN D) 1000 UNITS tablet Take 1,000 Units by mouth every morning.     . diazepam (VALIUM) 5 MG tablet Take 5 mg by mouth 3 (three) times daily. scheduled    . divalproex (DEPAKOTE) 500 MG 24 hr tablet Take 1,000 mg by mouth at bedtime.     . docusate sodium (COLACE) 100 MG capsule Take 200 mg by mouth at bedtime.     . DULoxetine (CYMBALTA) 60 MG capsule Take 60 mg by mouth daily.    . fluticasone (FLONASE) 50 MCG/ACT nasal spray PLACE 2 SPRAYS INTO BOTH NOSTRILS DAILY. (Patient taking differently: PLACE 2 SPRAYS INTO BOTH NOSTRILS DAILY as needed for allergies) 16 g 2  .  HYDROcodone-acetaminophen (NORCO) 10-325 MG per tablet Take 1 tablet by mouth every 6 (six) hours as needed for moderate pain. for pain  0  . ipratropium-albuterol (DUONEB) 0.5-2.5 (3) MG/3ML SOLN Take 3 mLs by nebulization every 6 (six) hours. 360 mL 3  . losartan-hydrochlorothiazide (HYZAAR) 50-12.5 MG tablet TAKE 1 TABLET BY MOUTH DAILY. 30 tablet 1  . metoprolol succinate (TOPROL-XL) 50 MG 24 hr tablet TAKE 1 TABLET (50 MG TOTAL) BY MOUTH EVERY MORNING. TAKE WITH OR IMMEDIATELY FOLLOWING A MEAL. 30 tablet 6  . montelukast (SINGULAIR) 10 MG tablet TAKE 1 TABLET BY MOUTH AT BEDTIME 30 tablet 6  . NITROSTAT 0.4 MG SL tablet PLACE 1 TABLET (0.4 MG TOTAL) UNDER THE TONGUE EVERY 5 (FIVE) MINUTES AS NEEDED FOR CHEST PAIN. 25 tablet 1  . omeprazole (PRILOSEC) 40 MG capsule Take 40 mg by mouth 2 (two) times daily.     . polyethylene glycol (MIRALAX / GLYCOLAX) packet Take 17 g by mouth 3 (three) times daily as needed for moderate constipation. For constipation    . primidone (MYSOLINE) 250 MG tablet TAKE 1 TABLET (250 MG TOTAL) BY MOUTH 2 (TWO) TIMES DAILY. 60 tablet 6  . promethazine (PHENERGAN) 25 MG tablet Take 25 mg by mouth every 6 (six) hours as needed. for nausea  1  . simvastatin (ZOCOR) 40 MG tablet TAKE 1 TABLET (40 MG TOTAL) BY MOUTH EVERY EVENING. 90   tablet 1  . tiZANidine (ZANAFLEX) 4 MG tablet TAKE 1 TABLET BY MOUTH EVERY 8 HOURS AS NEEDED FOR MUSCLE SPASMS. 90 tablet 1  . Zinc 50 MG TABS Take 50 mg by mouth daily.     . predniSONE (DELTASONE) 10 MG tablet take  4 each am x 2 days,  2 each am x 2 days,  1 each am x 2 days and stop (Patient not taking: Reported on 03/30/2015) 14 tablet 0   No current facility-administered medications for this visit.    Functional Status:  In your present state of health, do you have any difficulty performing the following activities: 03/30/2015 03/02/2015  Hearing? Y Y  Vision? N N  Difficulty concentrating or making decisions? N N  Walking or climbing  stairs? Y Y  Dressing or bathing? Y Y  Doing errands, shopping? Y Y  Preparing Food and eating ? Y Y  Using the Toilet? N N  In the past six months, have you accidently leaked urine? N N  Do you have problems with loss of bowel control? N N  Managing your Medications? Y Y  Managing your Finances? N N  Housekeeping or managing your Housekeeping? Y Y    Fall/Depression Screening: PHQ 2/9 Scores 03/30/2015 03/02/2015 12/29/2014 12/11/2014 11/30/2014 11/15/2014 09/27/2014  PHQ - 2 Score 1 1 1 0 1 0 0    Assessment:  Patient would continue to benefit from Health Coach telephonic outreach for education and support for COPD self management    [facility] CM Care Plan Problem One        Most Recent Value   Care Plan Problem One  Knowledge deficit on COPD   Role Documenting the Problem One  Health Coach   Care Plan for Problem One  Active   [facility] Long Term Goal (31-90 days)  Patient will not have any readmissions for COPD within the next 90 days   Interventions for Problem One Long Term Goal  RN Health Coach discussed with patient to make physician aware if symptoms are not improving.  RN Health Coach discussed with patient wife about taking all the medication  as per Dr order.    [facility] CM Short Term Goal #2 (0-30 days)  Patient will review educational information sent within 30 days.   [facility] CM Short Term Goal #2 Start Date  08/23/14   Interventions for Short Term Goal #2  RNCM sent EMMI information on COPD what you can DO. Using Oxygen at home Preventing falls to discuss with patient within 30 days   [facility] CM Short Term Goal #3 (0-30 days)  Patient and wife will be able to verbalize food to eat on dysphagia diet within 30 days   [facility] CM Short Term Goal #3 Start Date  -- [not met/patient appetite is poor and requires supplements ]   Interventions for Short Tern Goal #3  RN will send patient additional coupons due to poor appetite. RN will send patient information on reflux disease     Plan: RN will provide  on going education for patient on COPD through phone calls and sending printed information to patient for further discussion. RN will send patient educational information on Gastric Reflux RN will send patient educational information on Incentive Spirometry RN will send supplemental  Nutrition coupons RN will call patient within a month for follow up discussion   , BSN, RN [facility] Care Management RN Health Coach Phone: [phone] [facility] complies with applicable Federal civil rights laws   and does not discriminate on the basis of race, color, national origin, age, disability, or sex. Espaol (Spanish)  [facility] cumple con las leyes federales de derechos civiles aplicables y no discrimina por motivos de raza, color, nacionalidad, edad, discapacidad o sexo.    Ti?ng Vi?t (Vietnamese)  [facility] tun th? lu?t dn quy?n hi?n hnh c?a Lin bang v khng phn bi?t ?i x? d?a trn ch?ng t?c, mu da, ngu?n g?c qu?c gia, ? tu?i, khuy?t t?t, ho?c gi?i tnh.    (Arabic)    [facility]                      .    

## 2015-04-01 ENCOUNTER — Emergency Department (HOSPITAL_COMMUNITY): Payer: Medicare Other

## 2015-04-01 ENCOUNTER — Inpatient Hospital Stay (HOSPITAL_COMMUNITY)
Admission: EM | Admit: 2015-04-01 | Discharge: 2015-04-04 | DRG: 871 | Disposition: A | Discharge status: Home / Self Care | Payer: Medicare Other | Attending: Internal Medicine | Admitting: Internal Medicine

## 2015-04-01 ENCOUNTER — Encounter (HOSPITAL_COMMUNITY): Payer: Self-pay | Admitting: Emergency Medicine

## 2015-04-01 DIAGNOSIS — R531 Weakness: Secondary | ICD-10-CM | POA: Diagnosis not present

## 2015-04-01 DIAGNOSIS — K5900 Constipation, unspecified: Secondary | ICD-10-CM | POA: Diagnosis present

## 2015-04-01 DIAGNOSIS — G4700 Insomnia, unspecified: Secondary | ICD-10-CM | POA: Diagnosis present

## 2015-04-01 DIAGNOSIS — Z8546 Personal history of malignant neoplasm of prostate: Secondary | ICD-10-CM | POA: Diagnosis not present

## 2015-04-01 DIAGNOSIS — R000 Tachycardia, unspecified: Secondary | ICD-10-CM

## 2015-04-01 DIAGNOSIS — Z8673 Personal history of transient ischemic attack (TIA), and cerebral infarction without residual deficits: Secondary | ICD-10-CM | POA: Diagnosis not present

## 2015-04-01 DIAGNOSIS — I10 Essential (primary) hypertension: Secondary | ICD-10-CM | POA: Diagnosis present

## 2015-04-01 DIAGNOSIS — F329 Major depressive disorder, single episode, unspecified: Secondary | ICD-10-CM | POA: Diagnosis present

## 2015-04-01 DIAGNOSIS — I252 Old myocardial infarction: Secondary | ICD-10-CM | POA: Diagnosis not present

## 2015-04-01 DIAGNOSIS — Z955 Presence of coronary angioplasty implant and graft: Secondary | ICD-10-CM

## 2015-04-01 DIAGNOSIS — J439 Emphysema, unspecified: Secondary | ICD-10-CM | POA: Diagnosis present

## 2015-04-01 DIAGNOSIS — J189 Pneumonia, unspecified organism: Secondary | ICD-10-CM | POA: Diagnosis present

## 2015-04-01 DIAGNOSIS — I959 Hypotension, unspecified: Secondary | ICD-10-CM | POA: Diagnosis present

## 2015-04-01 DIAGNOSIS — E876 Hypokalemia: Secondary | ICD-10-CM | POA: Diagnosis present

## 2015-04-01 DIAGNOSIS — E871 Hypo-osmolality and hyponatremia: Secondary | ICD-10-CM | POA: Diagnosis present

## 2015-04-01 DIAGNOSIS — Z9981 Dependence on supplemental oxygen: Secondary | ICD-10-CM | POA: Diagnosis not present

## 2015-04-01 DIAGNOSIS — R251 Tremor, unspecified: Secondary | ICD-10-CM | POA: Diagnosis present

## 2015-04-01 DIAGNOSIS — J690 Pneumonitis due to inhalation of food and vomit: Secondary | ICD-10-CM | POA: Diagnosis present

## 2015-04-01 DIAGNOSIS — Z87891 Personal history of nicotine dependence: Secondary | ICD-10-CM | POA: Diagnosis not present

## 2015-04-01 DIAGNOSIS — R404 Transient alteration of awareness: Secondary | ICD-10-CM | POA: Diagnosis not present

## 2015-04-01 DIAGNOSIS — J9611 Chronic respiratory failure with hypoxia: Secondary | ICD-10-CM | POA: Diagnosis present

## 2015-04-01 DIAGNOSIS — F419 Anxiety disorder, unspecified: Secondary | ICD-10-CM | POA: Diagnosis present

## 2015-04-01 DIAGNOSIS — R0602 Shortness of breath: Secondary | ICD-10-CM | POA: Diagnosis not present

## 2015-04-01 DIAGNOSIS — C3431 Malignant neoplasm of lower lobe, right bronchus or lung: Secondary | ICD-10-CM | POA: Diagnosis not present

## 2015-04-01 DIAGNOSIS — I2510 Atherosclerotic heart disease of native coronary artery without angina pectoris: Secondary | ICD-10-CM | POA: Diagnosis present

## 2015-04-01 DIAGNOSIS — R222 Localized swelling, mass and lump, trunk: Secondary | ICD-10-CM | POA: Diagnosis not present

## 2015-04-01 DIAGNOSIS — K219 Gastro-esophageal reflux disease without esophagitis: Secondary | ICD-10-CM | POA: Diagnosis present

## 2015-04-01 DIAGNOSIS — E785 Hyperlipidemia, unspecified: Secondary | ICD-10-CM | POA: Diagnosis present

## 2015-04-01 DIAGNOSIS — A419 Sepsis, unspecified organism: Principal | ICD-10-CM | POA: Diagnosis present

## 2015-04-01 LAB — I-STAT ARTERIAL BLOOD GAS, ED
ACID-BASE DEFICIT: 3 mmol/L — AB (ref 0.0–2.0)
BICARBONATE: 20.7 meq/L (ref 20.0–24.0)
O2 Saturation: 93 %
PO2 ART: 63 mmHg — AB (ref 80.0–100.0)
TCO2: 22 mmol/L (ref 0–100)
pCO2 arterial: 31.7 mmHg — ABNORMAL LOW (ref 35.0–45.0)
pH, Arterial: 7.423 (ref 7.350–7.450)

## 2015-04-01 LAB — COMPREHENSIVE METABOLIC PANEL
ALT: 17 U/L (ref 17–63)
AST: 25 U/L (ref 15–41)
Albumin: 3.2 g/dL — ABNORMAL LOW (ref 3.5–5.0)
Alkaline Phosphatase: 68 U/L (ref 38–126)
Anion gap: 16 — ABNORMAL HIGH (ref 5–15)
BUN: 12 mg/dL (ref 6–20)
CHLORIDE: 93 mmol/L — AB (ref 101–111)
CO2: 22 mmol/L (ref 22–32)
CREATININE: 1.06 mg/dL (ref 0.61–1.24)
Calcium: 9.6 mg/dL (ref 8.9–10.3)
GFR calc Af Amer: >60 mL/min (ref >60)
GLUCOSE: 105 mg/dL — AB (ref 65–99)
Potassium: 4.4 mmol/L (ref 3.5–5.1)
Sodium: 131 mmol/L — ABNORMAL LOW (ref 135–145)
Total Bilirubin: 0.4 mg/dL (ref 0.3–1.2)
Total Protein: 7.3 g/dL (ref 6.5–8.1)

## 2015-04-01 LAB — URINALYSIS, ROUTINE W REFLEX MICROSCOPIC
BILIRUBIN URINE: NEGATIVE
GLUCOSE, UA: NEGATIVE mg/dL
HGB URINE DIPSTICK: NEGATIVE
Ketones, ur: 15 mg/dL — AB
Leukocytes, UA: NEGATIVE
Nitrite: NEGATIVE
PH: 6.5 (ref 5.0–8.0)
Protein, ur: NEGATIVE mg/dL
SPECIFIC GRAVITY, URINE: 1.022 (ref 1.005–1.030)

## 2015-04-01 LAB — PROTIME-INR
INR: 1.17 (ref 0.00–1.49)
Prothrombin Time: 15.1 seconds (ref 11.6–15.2)

## 2015-04-01 LAB — CBC WITH DIFFERENTIAL/PLATELET
Basophils Absolute: 0 10*3/uL (ref 0.0–0.1)
Basophils Relative: 0 %
EOS ABS: 0 10*3/uL (ref 0.0–0.7)
EOS PCT: 0 %
HCT: 36.2 % — ABNORMAL LOW (ref 39.0–52.0)
Hemoglobin: 12.1 g/dL — ABNORMAL LOW (ref 13.0–17.0)
LYMPHS ABS: 1.2 10*3/uL (ref 0.7–4.0)
Lymphocytes Relative: 9 %
MCH: 29.9 pg (ref 26.0–34.0)
MCHC: 33.4 g/dL (ref 30.0–36.0)
MCV: 89.4 fL (ref 78.0–100.0)
MONO ABS: 1.4 10*3/uL — AB (ref 0.1–1.0)
MONOS PCT: 10 %
Neutro Abs: 10.6 10*3/uL — ABNORMAL HIGH (ref 1.7–7.7)
Neutrophils Relative %: 81 %
PLATELETS: 359 10*3/uL (ref 150–400)
RBC: 4.05 MIL/uL — ABNORMAL LOW (ref 4.22–5.81)
RDW: 14.9 % (ref 11.5–15.5)
WBC: 13.2 10*3/uL — ABNORMAL HIGH (ref 4.0–10.5)

## 2015-04-01 LAB — I-STAT TROPONIN, ED: TROPONIN I, POC: 0 ng/mL (ref 0.00–0.08)

## 2015-04-01 LAB — PROCALCITONIN: Procalcitonin: <0.1 ng/mL

## 2015-04-01 LAB — I-STAT CG4 LACTIC ACID, ED: LACTIC ACID, VENOUS: 1.47 mmol/L (ref 0.5–2.0)

## 2015-04-01 MED ORDER — MORPHINE SULFATE (PF) 2 MG/ML IV SOLN
1.0000 mg | INTRAVENOUS | Status: DC | PRN
  Administered 2015-04-02 – 2015-04-04 (×3): 1 mg via INTRAVENOUS
  Filled 2015-04-01 (×3): qty 1

## 2015-04-01 MED ORDER — IPRATROPIUM-ALBUTEROL 0.5-2.5 (3) MG/3ML IN SOLN
3.0000 mL | Freq: Once | RESPIRATORY_TRACT | Status: AC
  Administered 2015-04-01: 3 mL via RESPIRATORY_TRACT
  Filled 2015-04-01: qty 3

## 2015-04-01 MED ORDER — ONDANSETRON HCL 4 MG PO TABS: 4.0000 mg | ORAL_TABLET | Freq: Four times a day (QID) | ORAL | Status: DC | PRN

## 2015-04-01 MED ORDER — ENOXAPARIN SODIUM 40 MG/0.4ML ~~LOC~~ SOLN
40.0000 mg | SUBCUTANEOUS | Status: DC
Start: 2015-04-02 — End: 2015-04-04
  Administered 2015-04-02 – 2015-04-03 (×2): 40 mg via SUBCUTANEOUS
  Filled 2015-04-01 (×3): qty 0.4

## 2015-04-01 MED ORDER — DIVALPROEX SODIUM ER 500 MG PO TB24
1000.0000 mg | ORAL_TABLET | Freq: Every day | ORAL | Status: DC
  Administered 2015-04-01 – 2015-04-03 (×3): 1000 mg via ORAL
  Filled 2015-04-01 (×3): qty 2

## 2015-04-01 MED ORDER — ACETAMINOPHEN 650 MG RE SUPP: 650.0000 mg | Freq: Four times a day (QID) | RECTAL | Status: DC | PRN

## 2015-04-01 MED ORDER — HYDROCODONE-ACETAMINOPHEN 5-325 MG PO TABS
1.0000 | ORAL_TABLET | ORAL | Status: DC | PRN
  Administered 2015-04-01 – 2015-04-02 (×3): 1 via ORAL
  Administered 2015-04-03 – 2015-04-04 (×5): 2 via ORAL
  Filled 2015-04-01 (×2): qty 2
  Filled 2015-04-01 (×2): qty 1
  Filled 2015-04-01 (×2): qty 2
  Filled 2015-04-01: qty 1
  Filled 2015-04-01: qty 2

## 2015-04-01 MED ORDER — ACETAMINOPHEN 500 MG PO TABS
1000.0000 mg | ORAL_TABLET | Freq: Once | ORAL | Status: AC
  Administered 2015-04-01: 1000 mg via ORAL
  Filled 2015-04-01: qty 2

## 2015-04-01 MED ORDER — VANCOMYCIN HCL 10 G IV SOLR
1500.0000 mg | Freq: Once | INTRAVENOUS | Status: AC
  Administered 2015-04-01: 1500 mg via INTRAVENOUS
  Filled 2015-04-01: qty 1500

## 2015-04-01 MED ORDER — GUAIFENESIN-DM 100-10 MG/5ML PO SYRP
5.0000 mL | ORAL_SOLUTION | ORAL | Status: DC | PRN
  Administered 2015-04-02 – 2015-04-03 (×2): 5 mL via ORAL
  Filled 2015-04-01 (×2): qty 5

## 2015-04-01 MED ORDER — SODIUM CHLORIDE 0.9% FLUSH
3.0000 mL | Freq: Two times a day (BID) | INTRAVENOUS | Status: DC
  Administered 2015-04-02 – 2015-04-03 (×3): 3 mL via INTRAVENOUS

## 2015-04-01 MED ORDER — DEXTROSE 5 % IV SOLN
1.0000 g | Freq: Three times a day (TID) | INTRAVENOUS | Status: DC
  Administered 2015-04-02 – 2015-04-04 (×8): 1 g via INTRAVENOUS
  Filled 2015-04-01 (×12): qty 1

## 2015-04-01 MED ORDER — SODIUM CHLORIDE 0.9 % IV SOLN
INTRAVENOUS | Status: DC
  Administered 2015-04-02: 10 mL via INTRAVENOUS
  Administered 2015-04-03: 16:00:00 via INTRAVENOUS

## 2015-04-01 MED ORDER — OSELTAMIVIR PHOSPHATE 75 MG PO CAPS
75.0000 mg | ORAL_CAPSULE | Freq: Two times a day (BID) | ORAL | Status: DC
  Administered 2015-04-01 – 2015-04-02 (×2): 75 mg via ORAL
  Filled 2015-04-01 (×5): qty 1

## 2015-04-01 MED ORDER — ONDANSETRON HCL 4 MG/2ML IJ SOLN: 4.0000 mg | Freq: Four times a day (QID) | INTRAMUSCULAR | Status: DC | PRN

## 2015-04-01 MED ORDER — METOPROLOL TARTRATE 50 MG PO TABS
50.0000 mg | ORAL_TABLET | Freq: Two times a day (BID) | ORAL | Status: DC
  Administered 2015-04-02 – 2015-04-03 (×5): 50 mg via ORAL
  Filled 2015-04-01 (×2): qty 2
  Filled 2015-04-01 (×3): qty 1

## 2015-04-01 MED ORDER — VANCOMYCIN HCL IN DEXTROSE 1-5 GM/200ML-% IV SOLN
1000.0000 mg | Freq: Two times a day (BID) | INTRAVENOUS | Status: DC
  Administered 2015-04-02 – 2015-04-03 (×3): 1000 mg via INTRAVENOUS
  Filled 2015-04-01 (×5): qty 200

## 2015-04-01 MED ORDER — ARIPIPRAZOLE 5 MG PO TABS
5.0000 mg | ORAL_TABLET | Freq: Every day | ORAL | Status: DC
Start: 2015-04-02 — End: 2015-04-04
  Administered 2015-04-02 – 2015-04-03 (×2): 5 mg via ORAL
  Filled 2015-04-01 (×3): qty 1

## 2015-04-01 MED ORDER — PANTOPRAZOLE SODIUM 40 MG PO TBEC
40.0000 mg | DELAYED_RELEASE_TABLET | Freq: Every day | ORAL | Status: DC
  Administered 2015-04-02 – 2015-04-03 (×2): 40 mg via ORAL
  Filled 2015-04-01 (×2): qty 1

## 2015-04-01 MED ORDER — SODIUM CHLORIDE 0.9 % IV BOLUS (SEPSIS)
2000.0000 mL | Freq: Once | INTRAVENOUS | Status: AC
  Administered 2015-04-01: 2000 mL via INTRAVENOUS

## 2015-04-01 MED ORDER — FENTANYL CITRATE (PF) 100 MCG/2ML IJ SOLN
50.0000 ug | Freq: Once | INTRAMUSCULAR | Status: AC
  Administered 2015-04-01: 50 ug via INTRAVENOUS
  Filled 2015-04-01: qty 2

## 2015-04-01 MED ORDER — IOHEXOL 350 MG/ML SOLN
100.0000 mL | Freq: Once | INTRAVENOUS | Status: AC | PRN
Start: 2015-04-01 — End: 2015-04-01
  Administered 2015-04-01: 100 mL via INTRAVENOUS

## 2015-04-01 MED ORDER — ACETAMINOPHEN 325 MG PO TABS
650.0000 mg | ORAL_TABLET | Freq: Four times a day (QID) | ORAL | Status: DC | PRN
  Administered 2015-04-02 – 2015-04-04 (×4): 650 mg via ORAL
  Filled 2015-04-01 (×4): qty 2

## 2015-04-01 MED ORDER — VANCOMYCIN HCL IN DEXTROSE 1-5 GM/200ML-% IV SOLN: 1000.0000 mg | Freq: Once | INTRAVENOUS | Status: DC

## 2015-04-01 MED ORDER — DEXTROSE 5 % IV SOLN
2.0000 g | Freq: Once | INTRAVENOUS | Status: AC
  Administered 2015-04-01: 2 g via INTRAVENOUS
  Filled 2015-04-01: qty 2

## 2015-04-01 MED ORDER — GUAIFENESIN ER 600 MG PO TB12
1200.0000 mg | ORAL_TABLET | Freq: Two times a day (BID) | ORAL | Status: DC
  Administered 2015-04-01 – 2015-04-03 (×5): 1200 mg via ORAL
  Filled 2015-04-01 (×7): qty 2

## 2015-04-01 MED ORDER — IPRATROPIUM-ALBUTEROL 0.5-2.5 (3) MG/3ML IN SOLN
3.0000 mL | RESPIRATORY_TRACT | Status: DC
  Administered 2015-04-01 – 2015-04-03 (×10): 3 mL via RESPIRATORY_TRACT
  Filled 2015-04-01 (×10): qty 3

## 2015-04-01 NOTE — Progress Notes (Signed)
Pharmacy note: cefepime and vancomycin dosing for sepsis -SCr= 1.06, CrCl ~ 70  Plan -Cefepime 1gm IV q8h -Vancomycin '1000mg'$  IV q12h -Will follow renal function, cultures and clinical progress  Hildred Laser, Pharm D 04/01/2015 4:44 PM

## 2015-04-01 NOTE — ED Provider Notes (Signed)
CSN: [id number]     Arrival date & time 04/01/15  1434 History   First MD Initiated Contact with Patient 04/01/15 1441     Chief Complaint  Patient presents with  . Weakness  . Altered Mental Status  . 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 He is currently under radiation therapy. The patient presents from home for weakness. His wife states that he was feeling general malaise yesterday. Today he awoke and felt extremely weak, had difficulty ambulating to the bathroom and required a lot of assistance. The patient was able to get back in his recliner and laid down. He fell asleep until about noon, at which time he woke up and was feeling too weak to even lift himself out of the chair. She states that it "kind of happened all of a sudden." His breathing is at baseline for him.  Past Medical History  Diagnosis Date  . Hyperlipidemia     takes Simvastatin daily  . Tremor   . History of prostate cancer   . On home O2   . Neuromuscular disorder ([place]) 1998    right carpal tunnel release  . Anemia associated with acute blood loss   . GERD (gastroesophageal reflux disease)     takes Omeprazole daily  . Hypertension     takes Metoprolol daily as well as Hyzaar  . Constipation     takes Colace daily as well as Miralax  . Anxiety     takes Valium daily  . Depression     takes Cymbalta daily  . Emphysema of lung ([facility])     Albuterol as needed;Symbicort daily and Singulair at bedtime  . Emphysema   . Myocardial infarction ([place]) 04/1998  . Coronary artery disease   . Asthma   . Shortness of breath     with exertion  . History of bronchitis   . Aspiration pneumonia ([place]) 2010  . Headache, chronic daily   . History of migraine      last migraine a couple of days ago;takes Excedrin Migraine  . Chronic back pain     compression fracture  . History of colon polyps   . Mood change ([place])     after Brain surgery mood changed and was placed on Depakote  . Insomnia     takes Benadryl nightly  . 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 Prostate cancer ([place]) 2004  . Lung cancer [facility])    Past Surgical History  Procedure Laterality Date  . Cholecystectomy  1996  . 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 Annette Stable  . Vascular stent  2000    Dr. Rollene Fare; he reports cardiac stent, but denies stent for PAD 06/15/13  . Hand surgery  1989    crushed thumb, Dr. Fredna Dow  . Ulnar nerve repair  1998    left arm, Dr. Fredna Dow  . Gastrostomy w/ feeding tube  2008    Dr. Watt Climes; only had for a few months  . Prostatectomy  04/2001    removal of prostate cancer, Dr. Janice Norrie  . Coronary angioplasty with stent placement  04/1998  . Carpal tunnel release  1998    right  . Spine surgery    . 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 Back surgery  08/2004; 02/2005; 04/2006; 06/2007; 7/20101    all by Dr. Annette Stable  . Esophagogastroduodenoscopy N/A 07/23/2012    Procedure: ESOPHAGOGASTRODUODENOSCOPY (EGD);  Surgeon: Jeryl Columbia, MD;  Location: [facility] ENDOSCOPY;  Service: Endoscopy;  Laterality: N/A;  buccini /ja  . Colonoscopy    . Back surgery  05/2013  . Dg biopsy lung     Family History  Problem Relation Age of Onset  . Heart disease Mother   . Cancer Father     brain cancer and prostate cancer    Social History  Substance Use Topics  . Smoking status: Former Smoker -- 1.50 packs/day for 52 years    Types: Cigarettes    Quit date: 07/17/2012  . Smokeless tobacco: Never Used     Comment: Former smoker  . Alcohol Use: No    Review of Systems Ten systems reviewed and are negative for acute change, except as noted in the HPI.     Allergies  Lisinopril; Lamictal; and Ambien  Home Medications   Prior to Admission medications   Medication  Sig Start Date End Date Taking? Authorizing Provider  ARIPiprazole (ABILIFY) 10 MG tablet Take 5 mg by mouth daily.  05/09/14   Historical Provider, MD  Ascorbic Acid (VITAMIN C) 1000 MG tablet Take 1,000 mg by mouth daily.    Historical Provider, MD  aspirin-acetaminophen-caffeine (EXCEDRIN MIGRAINE) [phone] MG per tablet Take 2 tablets by mouth every 6 (six) hours as needed. For headaches    Historical Provider, MD  benzonatate (TESSALON) 100 MG capsule Take 1 capsule (100 mg total) by mouth 3 (three) times daily as needed for cough. 03/08/15   Hayden Pedro, PA-C  budesonide (PULMICORT) 0.25 MG/2ML nebulizer solution Take 4 mLs (0.5 mg total) by nebulization 2 (two) times daily. 12/07/13   Kathee Delton, MD  cetirizine (ZYRTEC) 10 MG tablet Take 1 tablet (10 mg total) by mouth daily. 04/12/14   [name], MD  cholecalciferol (VITAMIN D) 1000 UNITS tablet Take 1,000 Units by mouth every morning.     Historical Provider, MD  diazepam (VALIUM) 5 MG tablet Take 5 mg by mouth 3 (three) times daily. scheduled 06/22/13   Earnie Larsson, MD  divalproex (DEPAKOTE) 500 MG 24 hr tablet Take 1,000 mg by mouth at bedtime.     Historical Provider, MD  docusate sodium (COLACE) 100 MG capsule Take 200 mg by mouth at bedtime.     Historical Provider, MD  DULoxetine (CYMBALTA) 60 MG capsule Take 60 mg by mouth daily.    Historical Provider, MD  fluticasone (FLONASE) 50 MCG/ACT nasal spray PLACE 2 SPRAYS INTO BOTH NOSTRILS DAILY. Patient taking differently: PLACE 2 SPRAYS INTO BOTH NOSTRILS DAILY as needed for allergies 10/30/14   [name], MD  HYDROcodone-acetaminophen [facility]) 10-325 MG per tablet Take 1 tablet by mouth every 6 (six) hours as needed for moderate pain. for pain 04/07/14   Historical Provider, MD  ipratropium-albuterol (DUONEB) 0.5-2.5 (3) MG/3ML SOLN Take 3 mLs by nebulization every 6 (six) hours. 12/07/13   Kathee Delton, MD  losartan-hydrochlorothiazide (HYZAAR) 50-12.5 MG  tablet TAKE 1 TABLET BY MOUTH DAILY. 02/26/15   Belva Crome, MD  metoprolol succinate (TOPROL-XL) 50 MG 24 hr tablet TAKE 1 TABLET (50 MG TOTAL) BY MOUTH EVERY MORNING. TAKE WITH OR IMMEDIATELY FOLLOWING A MEAL. 01/01/15   [name], MD  montelukast (SINGULAIR) 10 MG tablet TAKE 1 TABLET BY MOUTH  AT BEDTIME 01/01/15   [name], MD  NITROSTAT 0.4 MG SL tablet PLACE 1 TABLET (0.4 MG TOTAL) UNDER THE TONGUE EVERY 5 (FIVE) MINUTES AS NEEDED FOR CHEST PAIN. 08/10/14   Belva Crome, MD  omeprazole (PRILOSEC) 40 MG capsule Take 40 mg by mouth 2 (two) times daily.     Historical Provider, MD  polyethylene glycol (MIRALAX / GLYCOLAX) packet Take 17 g by mouth 3 (three) times daily as needed for moderate constipation. For constipation 07/24/12   Delfina Redwood, MD  predniSONE (DELTASONE) 10 MG tablet take  4 each am x 2 days,  2 each am x 2 days,  1 each am x 2 days and stop Patient not taking: Reported on 03/30/2015 03/13/15   Tanda Rockers, MD  primidone (MYSOLINE) 250 MG tablet TAKE 1 TABLET (250 MG TOTAL) BY MOUTH 2 (TWO) TIMES DAILY. 01/08/15   [name], MD  promethazine (PHENERGAN) 25 MG tablet Take 25 mg by mouth every 6 (six) hours as needed. for nausea 08/10/14   Historical Provider, MD  simvastatin (ZOCOR) 40 MG tablet TAKE 1 TABLET (40 MG TOTAL) BY MOUTH EVERY EVENING. 09/20/14   [name], MD  tiZANidine (ZANAFLEX) 4 MG tablet TAKE 1 TABLET BY MOUTH EVERY 8 HOURS AS NEEDED FOR MUSCLE SPASMS. 01/09/15   [name], MD  Zinc 50 MG TABS Take 50 mg by mouth daily.     Historical Provider, MD   BP 123/75 mmHg  Pulse 129  Temp(Src) 99.5 F (37.5 C) (Oral)  Resp 28  Ht '5\' 10"'$  (1.778 m)  Wt 72.576 kg  BMI 22.96 kg/m2  SpO2 100% Physical Exam  Constitutional: He is oriented to person, place, and time. He appears well-developed and well-nourished. No distress.  Chronically ill appearing male with significantly labored breathing  HENT:  Head:  Normocephalic and atraumatic.  Eyes: Conjunctivae are normal. No scleral icterus.  Neck: Normal range of motion. Neck supple.  Cardiovascular: Normal rate, regular rhythm, normal heart sounds and intact distal pulses.   Tachycardic  Pulmonary/Chest: No respiratory distress.  Patient with labored breathing, prolonged expiratory phase. He has kyphotic rounding of the spine and slight pectus excavatum. Patient has sternal retractions with belly breathing on examination. No wheezing.  Abdominal: Soft. He exhibits no distension. There is no tenderness. There is no rebound.  Musculoskeletal: He exhibits no edema.  Neurological: He is alert and oriented to person, place, and time.  Skin: Skin is warm and dry. He is not diaphoretic.  Skin is warm and dry without mottling  Psychiatric: His behavior is normal.  Nursing note and vitals reviewed.   ED Course  .Critical Care Performed by: Margarita Mail Authorized by: Margarita Mail Total critical care time: 35 minutes Critical care time was exclusive of separately billable procedures and treating other patients. Critical care was necessary to treat or prevent imminent or life-threatening deterioration of the following conditions: sepsis. Critical care was time spent personally by me on the following activities: development of treatment plan with patient or surrogate, discussions with consultants, interpretation of cardiac output measurements, evaluation of patient's response to treatment, examination of patient, obtaining history from patient or surrogate, ordering and performing treatments and interventions, ordering and review of laboratory studies, ordering and review of radiographic studies, review of old charts, re-evaluation of patient's condition and pulse oximetry.   (including critical care time) Labs Review Labs Reviewed  CBC WITH DIFFERENTIAL/PLATELET - Abnormal; Notable for the following:  WBC 13.2 (*)    RBC 4.05 (*)    Hemoglobin  12.1 (*)    HCT 36.2 (*)    Neutro Abs 10.6 (*)    Monocytes Absolute 1.4 (*)    All other components within normal limits  CULTURE, BLOOD (ROUTINE X 2)  CULTURE, BLOOD (ROUTINE X 2)  URINE CULTURE  COMPREHENSIVE METABOLIC PANEL  URINALYSIS, ROUTINE W REFLEX MICROSCOPIC (NOT AT [facility])  I-STAT CG4 LACTIC ACID, ED  I-STAT TROPOININ, ED  I-STAT ARTERIAL BLOOD GAS, ED    Imaging Review No results found. I have personally reviewed and evaluated these images and lab results as part of my medical decision-making.   EKG Interpretation None     ED ECG REPORT   Date: 04/01/2015  Rate:129  Rhythm: sinus tachycardia  QRS Axis: normal  Intervals: normal  ST/T Wave abnormalities: normal  Conduction Disutrbances:none  Narrative Interpretation:   Old EKG Reviewed: unchanged  I have personally reviewed the EKG tracing and agree with the computerized printout as noted.  MDM   Final diagnoses:  None    3:25 PM  BP 123/75 mmHg  Pulse 129  Temp(Src) 99.5 F (37.5 C) (Oral)  Resp 28  Ht '5\' 10"'$  (1.778 m)  Wt 72.576 kg  BMI 22.96 kg/m2  SpO2 100% Patient here with hypotension. Initial blood pressure was 96/p Patient found to be with low-grade Temperature elevation. He was tachycardic into the 130s. Patient is on chronic oxygen therapy at 2 L. Patient states he has been using it over the past few days Code sepsis initiated upon arrival.   5:39 PM BP 120/70 mmHg  Pulse 129  Temp(Src) 101.1 F (38.4 C) (Rectal)  Resp 26  Ht '5\' 10"'$  (1.778 m)  Wt 72.576 kg  BMI 22.96 kg/m2  SpO2 95% Patient with fever, Normotensive throughout his visit. He has had persistent tachycardia despite fluid resucitation. Mild hyponatremia. Anion gap of 16. CTA negative for PE. THere is ovoid mass in the LLL favored to be infectious in nature. Patient Treated with HCAP regimen. His UA is pending.   Patient with HCAP/ Sepsis. Treated under standard sepsis mgmt in the ED., I have ordered a flu  swab . Patient will be admitted by Dr. Hartford Poli.  Margarita Mail, PA-C 04/03/15 [place], MD 04/05/15 0725  Daleen Bo, MD 04/05/15 [phone]

## 2015-04-01 NOTE — Progress Notes (Signed)
Pharmacy Code Sepsis Protocol  Time of code sepsis page: 1501   Were antibiotics ordered at the time of the code sepsis page? Yes   Was it required to contact the physician? '[x]'$  Physician not contacted '[]'$  Physician contacted to order antibiotics for code sepsis '[]'$  Physician contacted to recommend changing antibiotics  Pharmacy consulted for: Cefepime and vancomycin  Anti-infectives    Start     Dose/Rate Route Frequency Ordered Stop   04/01/15 1545  vancomycin (VANCOCIN) 1,500 mg in sodium chloride 0.9 % 500 mL IVPB     1,500 mg 250 mL/hr over 120 Minutes Intravenous  Once 04/01/15 1504     04/01/15 1500  ceFEPIme (MAXIPIME) 2 g in dextrose 5 % 50 mL IVPB     2 g 100 mL/hr over 30 Minutes Intravenous  Once 04/01/15 1456     04/01/15 1500  vancomycin (VANCOCIN) IVPB 1000 mg/200 mL premix  Status:  Discontinued     1,000 mg 200 mL/hr over 60 Minutes Intravenous  Once 04/01/15 1456 04/01/15 1504        Masey Scheiber J, PharmD 04/01/2015, 3:08 PM

## 2015-04-01 NOTE — ED Notes (Signed)
Pt here via EMS from home. EMS treports pt waws trying to walk to restroom and was too weak to make it. Pt wife was with him and reports lowering him to the ground. Pt was recently on abx for pneumonia. EMS reports pt was initially hypotensive and altered.

## 2015-04-01 NOTE — ED Provider Notes (Signed)
  Face-to-face evaluation   History: Patient here for evaluation of weakness and suspected pneumonia. He's been less active than usual in the last 2 days. Also, some chest discomfort couple of days ago and took a nitroglycerin. At baseline, he is fairly inactive, due to his old head injury, and multiple medical problems. Today he is unable to walk and collapse, all his wife was assisting him to the bathroom.  Physical exam: Elderly, alert, sleeping, but arousable. Heart cardiac without murmur. Lungs anteriorly. When aroused, he is alert but seems somewhat confused  Medical screening examination/treatment/procedure(s) were conducted as a shared visit with non-physician practitioner(s) and myself.  I personally evaluated the patient during the encounter  Daleen Bo, MD 04/05/15 [phone]

## 2015-04-01 NOTE — Progress Notes (Signed)
Pharmacy Antibiotic Note  Malik Zuniga is a 70 y.o. male admitted on 04/01/2015 with sepsis.  Pharmacy has been consulted for cefepime and vancomycin dosing. Afebrile, other labs pending.  Plan: Give cefepime 2g IV x 1 Give vancomycin 1.5g IV x 1 Await SCr for further dosing Monitor clinical picture, renal function, VT prn F/U C&S, abx deescalation / LOT    Height: '5\' 10"'$  (177.8 cm) Weight: 160 lb (72.576 kg) IBW/kg (Calculated) : 73  Temp (24hrs), Avg:99.5 F (37.5 C), Min:99.5 F (37.5 C), Max:99.5 F (37.5 C)  No results for input(s): WBC, CREATININE, LATICACIDVEN, VANCOTROUGH, VANCOPEAK, VANCORANDOM, GENTTROUGH, GENTPEAK, GENTRANDOM, TOBRATROUGH, TOBRAPEAK, TOBRARND, AMIKACINPEAK, AMIKACINTROU, AMIKACIN in the last 168 hours.  CrCl cannot be calculated (Patient has no serum creatinine result on file.).    Allergies  Allergen Reactions  . Lisinopril Swelling    ANGIOEDEMA  . Lamictal [Lamotrigine] Other (See Comments)    Weakness/difficulty swallowing  . Ambien [Zolpidem] Other (See Comments)    Unknown reaction    Antimicrobials this admission: Cefepime 2/19 >>  Vancomycin 2/19 >>   Dose adjustments this admission: n/a  Microbiology results: Blood cx 2/19 > sent Urine cx 2/19 > sent  Thank you for allowing pharmacy to be a part of this patient's care.  Elenor Quinones, PharmD, BCPS Clinical Pharmacist Pager [phone] 04/01/2015 3:04 PM

## 2015-04-01 NOTE — H&P (Signed)
[facility] History and Physical  Malik Zuniga GEX:[id number] DOB: [birth year] DOA: 04/01/2015  Referring physician: EDP PCP: [name], MD   Chief Complaint: Generalized weakness and fever  HPI: Malik Zuniga is a 70 y.o. male with past medical history of NSCLC of RLL, recurrent aspiration pneumonia and chronic respiratory failure on 2 L of oxygen at home secondary to severe emphysema. Patient came to the hospital complaining about generalized weakness and fever. Patient was in his usual state of health until yesterday when he developed weakness, his wife reported that she had almost to carry him back to his bed from the bathroom. Then he developed fever, moderate cough without sputum production so she brought him to the hospital for further evaluation. All these information obtained from the EDP chart as no family at bedside, patient tells me he doesn't remember everything. In the ED CT scan of the chest was done and showed new oval 4.5 cm masslike density but favored to be infection, he has fever of 101.1 and heart rate of 124 when he comes. Admitted for further evaluation.  Review of Systems:  Constitutional: negative for anorexia, fevers and sweats Eyes: negative for irritation, redness and visual disturbance Ears, nose, mouth, throat, and face: negative for earaches, epistaxis, nasal congestion and sore throat Respiratory: negative for cough, dyspnea on exertion, sputum and wheezing Cardiovascular: negative for chest pain, dyspnea, lower extremity edema, orthopnea, palpitations and syncope Gastrointestinal: negative for abdominal pain, constipation, diarrhea, melena, nausea and vomiting Genitourinary:negative for dysuria, frequency and hematuria Hematologic/lymphatic: negative for bleeding, easy bruising and lymphadenopathy Musculoskeletal:negative for arthralgias, muscle weakness and stiff joints Neurological: negative for coordination problems, gait problems, headaches  and weakness Endocrine: negative for diabetic symptoms including polydipsia, polyuria and weight loss Allergic/Immunologic: negative for anaphylaxis, hay fever and urticaria  Past Medical History  Diagnosis Date  . Hyperlipidemia     takes Simvastatin daily  . Tremor   . History of prostate cancer   . On home O2   . Neuromuscular disorder ([place]) 1998    right carpal tunnel release  . Anemia associated with acute blood loss   . GERD (gastroesophageal reflux disease)     takes Omeprazole daily  . Hypertension     takes Metoprolol daily as well as Hyzaar  . Constipation     takes Colace daily as well as Miralax  . Anxiety     takes Valium daily  . Depression     takes Cymbalta daily  . Emphysema of lung (HCC)     Albuterol as needed;Symbicort daily and Singulair at bedtime  . Emphysema   . Myocardial infarction ([place]) 04/1998  . Coronary artery disease   . Asthma   . Shortness of breath     with exertion  . History of bronchitis   . Aspiration pneumonia ([place]) 2010  . Headache, chronic daily   . History of migraine     last migraine a couple of days ago;takes Excedrin Migraine  . Chronic back pain     compression fracture  . History of colon polyps   . Mood change ([place])     after Brain surgery mood changed and was placed on Depakote  . Insomnia     takes Benadryl nightly  . 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 Prostate cancer ([place]) 2004  . Lung cancer [facility])    Past Surgical History  Procedure Laterality Date  . Cholecystectomy  1996  . Craniotomy  1999  to clip aneurism, Dr. Annette Stable  . Vascular stent  2000    Dr. Rollene Fare; he reports cardiac stent, but denies stent for PAD 06/15/13  . Hand surgery  1989    crushed thumb, Dr. Fredna Dow  . Ulnar nerve repair  1998    left arm, Dr. Fredna Dow  . Gastrostomy w/ feeding tube  2008    Dr. Watt Climes; only had for a few months  . Prostatectomy  04/2001    removal of prostate cancer, Dr. Janice Norrie  . Coronary angioplasty with stent  placement  04/1998  . Carpal tunnel release  1998    right  . Spine surgery    . 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 Back surgery  08/2004; 02/2005; 04/2006; 06/2007; 7/20101    all by Dr. Annette Stable  . Esophagogastroduodenoscopy N/A 07/23/2012    Procedure: ESOPHAGOGASTRODUODENOSCOPY (EGD);  Surgeon: Jeryl Columbia, MD;  Location: [facility] ENDOSCOPY;  Service: Endoscopy;  Laterality: N/A;  buccini /ja  . Colonoscopy    . Back surgery  05/2013  . Dg biopsy lung     Social History:   reports that he quit smoking about 2 years ago. His smoking use included Cigarettes. He has a 78 pack-year smoking history. He has never used smokeless tobacco. He reports that he uses illicit drugs ([place]). He reports that he does not drink alcohol.  Allergies  Allergen Reactions  . Lisinopril Swelling    ANGIOEDEMA  . Lamictal [Lamotrigine] Other (See Comments)    Weakness/difficulty swallowing  . Ambien [Zolpidem] Other (See Comments)    Unknown reaction    Family History  Problem Relation Age of Onset  . Heart disease Mother   . Cancer Father     brain cancer and prostate cancer     Prior to Admission medications   Medication Sig Start Date End Date Taking? Authorizing Provider  ARIPiprazole (ABILIFY) 10 MG tablet Take 5 mg by mouth daily.  05/09/14  Yes Historical Provider, MD  Ascorbic Acid (VITAMIN C) 1000 MG tablet Take 1,000 mg by mouth daily.   Yes Historical Provider, MD  aspirin-acetaminophen-caffeine (EXCEDRIN MIGRAINE) [id number] MG per tablet Take 2 tablets by mouth every 6 (six) hours as needed. For headaches   Yes Historical Provider, MD  benzonatate (TESSALON) 100 MG capsule Take 1 capsule (100 mg total) by mouth 3 (three) times daily as needed for cough. 03/08/15  Yes Hayden Pedro, PA-C  budesonide (PULMICORT) 0.25 MG/2ML nebulizer solution Take 4 mLs (0.5 mg total) by nebulization 2 (two) times daily. 12/07/13  Yes Kathee Delton, MD  cetirizine (ZYRTEC) 10 MG tablet Take 1 tablet  (10 mg total) by mouth daily. 04/12/14  Yes [name], MD  cholecalciferol (VITAMIN D) 1000 UNITS tablet Take 1,000 Units by mouth every morning.    Yes Historical Provider, MD  diazepam (VALIUM) 5 MG tablet Take 5 mg by mouth 3 (three) times daily. scheduled 06/22/13  Yes Earnie Larsson, MD  divalproex (DEPAKOTE) 500 MG 24 hr tablet Take 1,000 mg by mouth at bedtime.    Yes Historical Provider, MD  docusate sodium (COLACE) 100 MG capsule Take 200 mg by mouth at bedtime.    Yes Historical Provider, MD  DULoxetine (CYMBALTA) 60 MG capsule Take 60 mg by mouth daily.   Yes Historical Provider, MD  fluticasone (FLONASE) 50 MCG/ACT nasal spray PLACE 2 SPRAYS INTO BOTH NOSTRILS DAILY. Patient taking differently: PLACE 2 SPRAYS INTO BOTH NOSTRILS DAILY as needed for allergies  10/30/14  Yes [name], MD  HYDROcodone-acetaminophen (NORCO) 10-325 MG per tablet Take 1 tablet by mouth every 6 (six) hours as needed for moderate pain. for pain 04/07/14  Yes Historical Provider, MD  ipratropium-albuterol (DUONEB) 0.5-2.5 (3) MG/3ML SOLN Take 3 mLs by nebulization every 6 (six) hours. 12/07/13  Yes Kathee Delton, MD  losartan-hydrochlorothiazide (HYZAAR) 50-12.5 MG tablet TAKE 1 TABLET BY MOUTH DAILY. 02/26/15  Yes Belva Crome, MD  metoprolol succinate (TOPROL-XL) 50 MG 24 hr tablet TAKE 1 TABLET (50 MG TOTAL) BY MOUTH EVERY MORNING. TAKE WITH OR IMMEDIATELY FOLLOWING A MEAL. 01/01/15  Yes [name], MD  montelukast (SINGULAIR) 10 MG tablet TAKE 1 TABLET BY MOUTH AT BEDTIME 01/01/15  Yes [name], MD  NITROSTAT 0.4 MG SL tablet PLACE 1 TABLET (0.4 MG TOTAL) UNDER THE TONGUE EVERY 5 (FIVE) MINUTES AS NEEDED FOR CHEST PAIN. 08/10/14  Yes Belva Crome, MD  omeprazole (PRILOSEC) 40 MG capsule Take 40 mg by mouth 2 (two) times daily.    Yes Historical Provider, MD  polyethylene glycol (MIRALAX / GLYCOLAX) packet Take 17 g by mouth 3 (three) times daily as needed for moderate constipation.  For constipation 07/24/12  Yes Delfina Redwood, MD  primidone (MYSOLINE) 250 MG tablet TAKE 1 TABLET (250 MG TOTAL) BY MOUTH 2 (TWO) TIMES DAILY. 01/08/15  Yes [name], MD  promethazine (PHENERGAN) 25 MG tablet Take 25 mg by mouth every 6 (six) hours as needed. for nausea 08/10/14  Yes Historical Provider, MD  simvastatin (ZOCOR) 40 MG tablet TAKE 1 TABLET (40 MG TOTAL) BY MOUTH EVERY EVENING. 09/20/14  Yes [name], MD  tiZANidine (ZANAFLEX) 4 MG tablet TAKE 1 TABLET BY MOUTH EVERY 8 HOURS AS NEEDED FOR MUSCLE SPASMS. 01/09/15  Yes [name], MD  Zinc 50 MG TABS Take 50 mg by mouth daily.    Yes Historical Provider, MD   Physical Exam: Filed Vitals:   04/01/15 1815 04/01/15 1830  BP: 105/58 105/59  Pulse: 125 125  Temp:    Resp: 20 25   Constitutional: Oriented to person, place, and time. Well-developed and well-nourished. Cooperative.  Head: Normocephalic and atraumatic.  Nose: Nose normal.  Mouth/Throat: Uvula is midline, oropharynx is clear and moist and mucous membranes are normal.  Eyes: Conjunctivae and EOM are normal. Pupils are equal, round, and reactive to light.  Neck: Trachea normal and normal range of motion. Neck supple.  Cardiovascular: Normal rate, regular rhythm, S1 normal, S2 normal, normal heart sounds and intact distal pulses.   Pulmonary/Chest: Effort normal and breath sounds normal.  Abdominal: Soft. Bowel sounds are normal. There is no hepatosplenomegaly. There is no tenderness.  Musculoskeletal: Normal range of motion.  Neurological: Alert and oriented to person, place, and time. Has normal strength. No cranial nerve deficit or sensory deficit.  Skin: Skin is warm, dry and intact.  Psychiatric: Has a normal mood and affect. Speech is normal and behavior is normal.   Labs on Admission:  Basic Metabolic Panel:  Recent Labs Lab 04/01/15 1450  NA 131*  K 4.4  CL 93*  CO2 22  GLUCOSE 105*  BUN 12  CREATININE 1.06  CALCIUM 9.6     Liver Function Tests:  Recent Labs Lab 04/01/15 1450  AST 25  ALT 17  ALKPHOS 68  BILITOT 0.4  PROT 7.3  ALBUMIN 3.2*   No results for input(s): LIPASE, AMYLASE in the last 168 hours. No results for input(s):  AMMONIA in the last 168 hours. CBC:  Recent Labs Lab 04/01/15 1450  WBC 13.2*  NEUTROABS 10.6*  HGB 12.1*  HCT 36.2*  MCV 89.4  PLT 359   Cardiac Enzymes: No results for input(s): CKTOTAL, CKMB, CKMBINDEX, TROPONINI in the last 168 hours.  BNP (last 3 results) No results for input(s): BNP in the last 8760 hours.  ProBNP (last 3 results) No results for input(s): PROBNP in the last 8760 hours.  CBG: No results for input(s): GLUCAP in the last 168 hours.  Radiological Exams on Admission: Ct Angio Chest Pe W/cm &/or Wo Cm  04/01/2015  CLINICAL DATA:  Shortness of breath. Weakness. History of hypertension and emphysema. History of prostate cancer and lung cancer. EXAM: CT ANGIOGRAPHY CHEST WITH CONTRAST TECHNIQUE: Multidetector CT imaging of the chest was performed using the standard protocol during bolus administration of intravenous contrast. Multiplanar CT image reconstructions and MIPs were obtained to evaluate the vascular anatomy. CONTRAST:  122m OMNIPAQUE IOHEXOL 350 MG/ML SOLN COMPARISON:  Chest x-ray 04/01/2015, CT 12/01/2014 and 11/08/2014 FINDINGS: Heart: Coronary artery calcifications are present. Heart size is normal. Vascular structures: Pulmonary arteries are well opacified. There is no evidence for acute pulmonary embolus. There is atherosclerotic calcification of the thoracic aorta. No aneurysm or evidence for dissection. Mediastinum/thyroid: The visualized portion of the thyroid gland has a normal appearance. Small lymph nodes are identified in the AP window region, measuring up to 1.0 cm. Precarinal lymph node is 1.7 cm. Left hilar lymph nodes are present and are asymmetric compared to those on the right, possibly secondary to left lower lobe process.  Lungs/Airways: There is a left pleural effusion. Oval masslike density is identified at the left lung base measuring 2.7 x 4.5 cm. Spur present kidney finding. Left basilar atelectasis is present. Emphysematous changes are noted. There is an irregularly-shaped nodule in posterior aspect of the right lower lobe, measuring 0.9 x 1.0 cm on image 46 of series 5. Nodule appears slightly smaller compared prior study but is adjacent to atelectasis and small effusion possibly accounting for the changes. Upper abdomen: Status post cholecystectomy. Chest wall/osseous structures: Scoliosis with spinal fixation rods. No suspicious lytic or blastic lesions are identified. There is fusion of T8-9. Hardware has been removed from this fused level. Review of the MIP images confirms the above findings. IMPRESSION: 1. Persistent nodule within the posterior aspect of the right lower lobe measuring 1.0 cm maximum diameter. Pre biopsy the lesion measured 12 mm. The apparent change may be related to interval biopsy and/or mass adjacent to the atelectasis and small effusion today. 2. Left lower lobe oval density measuring 4.5 cm. Although somewhat masslike in configuration, findings are favored to be related to infectious process. Follow-up is recommended to exclude malignancy. There are associated small mediastinal and left hilar lymph nodes, most likely reactive. Electronically Signed   By: ENolon NationsM.D.   On: 04/01/2015 17:06   Dg Chest Port 1 View  04/01/2015  CLINICAL DATA:  Patient brought to ed by wife after collapsing today, patient presents with swelling in upper extremities and weakness. Patient recently finished radiation treatment for lung cancer in Lower left lobe EXAM: PORTABLE CHEST 1 VIEW COMPARISON:  Chest radiograph, 01/31/2015.  Chest CT:  11/08/2014. FINDINGS: Right lower lobe lung carcinoma is not evident on the current exam. It may be partly obscured by overlying wires. Lungs are clear.  No pleural effusion  or pneumothorax. Heart, mediastinum and hila are unremarkable. IMPRESSION: No acute cardiopulmonary disease. Electronically Signed  By: Lajean Manes M.D.   On: 04/01/2015 15:19    EKG: Independently reviewed.   Assessment/Plan Principal Problem:   Sepsis ([place]) Active Problems:   Essential hypertension   PROSTATE CANCER, HX OF   Recurrent aspiration pneumonia (HCC)   CAP (community acquired pneumonia)   Cancer of lower lobe of right lung ([place])   Chronic respiratory failure with hypoxia (HCC)    Sepsis Present on admission, heart rate of 124 and fever of 101.1 with presence of pneumonia. Patient started on broad-spectrum IV antibiotics and Tamiflu. Also started on aggressive hydration with IV fluids.  Community acquired pneumonia Left lower lobe pneumonia, did not appear on the x-ray but CT scan showed oval masslike density favors infection. Started on cefepime, vancomycin and Tamiflu. Check pro calcitonin, flu PCR and lactate. Supportive management with bronchodilators, mucolytics, antitussives and oxygen.  Essential hypertension Hold lisinopril and HCTZ, continue beta blockers.  NSCLC of the RLL Patient has cancer diagnosed last October, per patient undergone radiation therapy. Follow-up with oncology/pulmonology as outpatient.  Chronic respiratory failure with hypoxia Secondary to emphysema, on 2 L of oxygen at home.    Code Status: Full code Family Communication: Discussed with the patient Disposition Plan: Inpatient, telemetry  Time spent: 70 minutes  Valentin Benney A, MD [facility] Pager [phone]

## 2015-04-02 LAB — BASIC METABOLIC PANEL
Anion gap: 14 (ref 5–15)
BUN: 8 mg/dL (ref 6–20)
CALCIUM: 8.9 mg/dL (ref 8.9–10.3)
CHLORIDE: 98 mmol/L — AB (ref 101–111)
CO2: 23 mmol/L (ref 22–32)
CREATININE: 0.87 mg/dL (ref 0.61–1.24)
GFR calc non Af Amer: >60 mL/min (ref >60)
Glucose, Bld: 109 mg/dL — ABNORMAL HIGH (ref 65–99)
Potassium: 4.3 mmol/L (ref 3.5–5.1)
Sodium: 135 mmol/L (ref 135–145)

## 2015-04-02 LAB — INFLUENZA PANEL BY PCR (TYPE A & B)
H1N1FLUPCR: NOT DETECTED
Influenza A By PCR: NEGATIVE
Influenza B By PCR: NEGATIVE

## 2015-04-02 LAB — MRSA PCR SCREENING: MRSA by PCR: NEGATIVE

## 2015-04-02 LAB — CBC
HCT: 33.4 % — ABNORMAL LOW (ref 39.0–52.0)
Hemoglobin: 11 g/dL — ABNORMAL LOW (ref 13.0–17.0)
MCH: 29.4 pg (ref 26.0–34.0)
MCHC: 32.9 g/dL (ref 30.0–36.0)
MCV: 89.3 fL (ref 78.0–100.0)
PLATELETS: 303 10*3/uL (ref 150–400)
RBC: 3.74 MIL/uL — AB (ref 4.22–5.81)
RDW: 15.1 % (ref 11.5–15.5)
WBC: 11.9 10*3/uL — ABNORMAL HIGH (ref 4.0–10.5)

## 2015-04-02 LAB — TSH: TSH: 0.431 u[IU]/mL (ref 0.350–4.500)

## 2015-04-02 LAB — STREP PNEUMONIAE URINARY ANTIGEN: STREP PNEUMO URINARY ANTIGEN: NEGATIVE

## 2015-04-02 MED ORDER — DULOXETINE HCL 30 MG PO CPEP
30.0000 mg | ORAL_CAPSULE | Freq: Two times a day (BID) | ORAL | Status: DC
  Administered 2015-04-02 – 2015-04-03 (×3): 30 mg via ORAL
  Filled 2015-04-02 (×3): qty 1

## 2015-04-02 NOTE — ED Notes (Signed)
Pt request foley. Explained that there was very specific criteria for foley placement.

## 2015-04-02 NOTE — Progress Notes (Signed)
[facility] PROGRESS NOTE   Malik Zuniga JIR:[id number] DOB: 1945/11/10 DOA: 04/01/2015 PCP: [name], MD  HPI/Subjective: Denies fever chills, still feels very weak, asking for catheter.  Assessment/Plan: Principal Problem:   Sepsis ([place]) Active Problems:   Essential hypertension   PROSTATE CANCER, HX OF   Recurrent aspiration pneumonia (HCC)   CAP (community acquired pneumonia)   Cancer of lower lobe of right lung ([place])   Chronic respiratory failure with hypoxia (HCC)   Sepsis Present on admission, heart rate of 124 and fever of 101.1 with presence of pneumonia. Patient started on broad-spectrum IV antibiotics and Tamiflu. Also started on aggressive hydration with IV fluids.  Community acquired pneumonia Left lower lobe pneumonia, did not appear on the x-ray but CT scan showed oval masslike density favors infection. Started on cefepime, vancomycin and Tamiflu. Check pro calcitonin, flu PCR and lactate. Supportive management with bronchodilators, mucolytics, antitussives and oxygen.  Essential hypertension Hold lisinopril and HCTZ, continue beta blockers.  NSCLC of the RLL Patient has cancer diagnosed last October, per patient undergone radiation therapy. Follow-up with oncology/pulmonology as outpatient.  Chronic respiratory failure with hypoxia Secondary to emphysema, on 2 L of oxygen at home.   Code Status: Full Code Family Communication: Plan discussed with the patient. Disposition Plan: Remains inpatient Diet: Diet Heart Room service appropriate?: Yes; Fluid consistency:: Thin  Consultants:  None  Procedures:  None  Antibiotics:  Cefepime and vancomycin   Objective: Filed Vitals:   04/02/15 1219 04/02/15 1330  BP: 101/54 101/58  Pulse: 96 89  Temp: 99.9 F (37.7 C) 98.9 F (37.2 C)  Resp: 23 28   No intake or output data in the 24 hours ending 04/02/15 1505 Filed Weights   04/01/15 1439 04/02/15 1330  Weight: 72.576 kg  (160 lb) 71.986 kg (158 lb 11.2 oz)    Exam: General: Alert and awake, oriented x3, not in any acute distress. HEENT: anicteric sclera, pupils reactive to light and accommodation, EOMI CVS: S1-S2 clear, no murmur rubs or gallops Chest: clear to auscultation bilaterally, no wheezing, rales or rhonchi Abdomen: soft nontender, nondistended, normal bowel sounds, no organomegaly Extremities: no cyanosis, clubbing or edema noted bilaterally Neuro: Cranial nerves II-XII intact, no focal neurological deficits  Data Reviewed: Basic Metabolic Panel:  Recent Labs Lab 04/01/15 1450 04/02/15 0325  NA 131* 135  K 4.4 4.3  CL 93* 98*  CO2 22 23  GLUCOSE 105* 109*  BUN 12 8  CREATININE 1.06 0.87  CALCIUM 9.6 8.9   Liver Function Tests:  Recent Labs Lab 04/01/15 1450  AST 25  ALT 17  ALKPHOS 68  BILITOT 0.4  PROT 7.3  ALBUMIN 3.2*   No results for input(s): LIPASE, AMYLASE in the last 168 hours. No results for input(s): AMMONIA in the last 168 hours. CBC:  Recent Labs Lab 04/01/15 1450 04/02/15 0325  WBC 13.2* 11.9*  NEUTROABS 10.6*  --   HGB 12.1* 11.0*  HCT 36.2* 33.4*  MCV 89.4 89.3  PLT 359 303   Cardiac Enzymes: No results for input(s): CKTOTAL, CKMB, CKMBINDEX, TROPONINI in the last 168 hours. BNP (last 3 results) No results for input(s): BNP in the last 8760 hours.  ProBNP (last 3 results) No results for input(s): PROBNP in the last 8760 hours.  CBG: No results for input(s): GLUCAP in the last 168 hours.  Micro Recent Results (from the past 240 hour(s))  Culture, blood (routine x 2)     Status: None (Preliminary result)   Collection  Time: 04/01/15  2:50 PM  Result Value Ref Range Status   Specimen Description RIGHT ANTECUBITAL  Final   Special Requests BOTTLES DRAWN AEROBIC AND ANAEROBIC 5ML  Final   Culture NO GROWTH < 24 HOURS  Final   Report Status PENDING  Incomplete  Culture, blood (routine x 2)     Status: None (Preliminary result)   Collection  Time: 04/01/15  3:20 PM  Result Value Ref Range Status   Specimen Description BLOOD RIGHT HAND  Final   Special Requests IN PEDIATRIC BOTTLE 1MLS  Final   Culture NO GROWTH < 24 HOURS  Final   Report Status PENDING  Incomplete  Urine culture     Status: None (Preliminary result)   Collection Time: 04/01/15  5:26 PM  Result Value Ref Range Status   Specimen Description URINE, CLEAN CATCH  Final   Special Requests NONE  Final   Culture TOO YOUNG TO READ  Final   Report Status PENDING  Incomplete     Studies: Ct Angio Chest Pe W/cm &/or Wo Cm  04/01/2015  CLINICAL DATA:  Shortness of breath. Weakness. History of hypertension and emphysema. History of prostate cancer and lung cancer. EXAM: CT ANGIOGRAPHY CHEST WITH CONTRAST TECHNIQUE: Multidetector CT imaging of the chest was performed using the standard protocol during bolus administration of intravenous contrast. Multiplanar CT image reconstructions and MIPs were obtained to evaluate the vascular anatomy. CONTRAST:  153m OMNIPAQUE IOHEXOL 350 MG/ML SOLN COMPARISON:  Chest x-ray 04/01/2015, CT 12/01/2014 and 11/08/2014 FINDINGS: Heart: Coronary artery calcifications are present. Heart size is normal. Vascular structures: Pulmonary arteries are well opacified. There is no evidence for acute pulmonary embolus. There is atherosclerotic calcification of the thoracic aorta. No aneurysm or evidence for dissection. Mediastinum/thyroid: The visualized portion of the thyroid gland has a normal appearance. Small lymph nodes are identified in the AP window region, measuring up to 1.0 cm. Precarinal lymph node is 1.7 cm. Left hilar lymph nodes are present and are asymmetric compared to those on the right, possibly secondary to left lower lobe process. Lungs/Airways: There is a left pleural effusion. Oval masslike density is identified at the left lung base measuring 2.7 x 4.5 cm. Spur present kidney finding. Left basilar atelectasis is present. Emphysematous  changes are noted. There is an irregularly-shaped nodule in posterior aspect of the right lower lobe, measuring 0.9 x 1.0 cm on image 46 of series 5. Nodule appears slightly smaller compared prior study but is adjacent to atelectasis and small effusion possibly accounting for the changes. Upper abdomen: Status post cholecystectomy. Chest wall/osseous structures: Scoliosis with spinal fixation rods. No suspicious lytic or blastic lesions are identified. There is fusion of T8-9. Hardware has been removed from this fused level. Review of the MIP images confirms the above findings. IMPRESSION: 1. Persistent nodule within the posterior aspect of the right lower lobe measuring 1.0 cm maximum diameter. Pre biopsy the lesion measured 12 mm. The apparent change may be related to interval biopsy and/or mass adjacent to the atelectasis and small effusion today. 2. Left lower lobe oval density measuring 4.5 cm. Although somewhat masslike in configuration, findings are favored to be related to infectious process. Follow-up is recommended to exclude malignancy. There are associated small mediastinal and left hilar lymph nodes, most likely reactive. Electronically Signed   By: ENolon NationsM.D.   On: 04/01/2015 17:06   Dg Chest Port 1 View  04/01/2015  CLINICAL DATA:  Patient brought to ed by wife after collapsing today,  patient presents with swelling in upper extremities and weakness. Patient recently finished radiation treatment for lung cancer in Lower left lobe EXAM: PORTABLE CHEST 1 VIEW COMPARISON:  Chest radiograph, 01/31/2015.  Chest CT:  11/08/2014. FINDINGS: Right lower lobe lung carcinoma is not evident on the current exam. It may be partly obscured by overlying wires. Lungs are clear.  No pleural effusion or pneumothorax. Heart, mediastinum and hila are unremarkable. IMPRESSION: No acute cardiopulmonary disease. Electronically Signed   By: Lajean Manes M.D.   On: 04/01/2015 15:19    Scheduled Meds: .  ARIPiprazole  5 mg Oral Daily  . ceFEPime (MAXIPIME) IV  1 g Intravenous Q8H  . divalproex  1,000 mg Oral QHS  . enoxaparin (LOVENOX) injection  40 mg Subcutaneous Q24H  . guaiFENesin  1,200 mg Oral BID  . ipratropium-albuterol  3 mL Nebulization Q4H  . metoprolol tartrate  50 mg Oral BID  . oseltamivir  75 mg Oral BID  . pantoprazole  40 mg Oral Daily  . sodium chloride flush  3 mL Intravenous Q12H  . vancomycin  1,000 mg Intravenous Q12H   Continuous Infusions: . sodium chloride 10 mL (04/02/15 0900)       Time spent: 35 minutes    [facility] A  [facility] Pager [phone] If 7PM-7AM, please contact night-coverage at [email or web address], password [facility] 04/02/2015, 3:05 PM  LOS: 1 day

## 2015-04-02 NOTE — ED Notes (Addendum)
A cup of water was given to patient, and a soft diet was ordered for lunch.

## 2015-04-03 ENCOUNTER — Encounter: Payer: Self-pay | Admitting: *Deleted

## 2015-04-03 LAB — BASIC METABOLIC PANEL
ANION GAP: 10 (ref 5–15)
BUN: 8 mg/dL (ref 6–20)
CHLORIDE: 100 mmol/L — AB (ref 101–111)
CO2: 21 mmol/L — ABNORMAL LOW (ref 22–32)
Calcium: 8.6 mg/dL — ABNORMAL LOW (ref 8.9–10.3)
Creatinine, Ser: 0.62 mg/dL (ref 0.61–1.24)
GFR calc non Af Amer: >60 mL/min (ref >60)
Glucose, Bld: 104 mg/dL — ABNORMAL HIGH (ref 65–99)
POTASSIUM: 3.4 mmol/L — AB (ref 3.5–5.1)
SODIUM: 131 mmol/L — AB (ref 135–145)

## 2015-04-03 LAB — CBC
HEMATOCRIT: 28.7 % — AB (ref 39.0–52.0)
HEMOGLOBIN: 9.6 g/dL — AB (ref 13.0–17.0)
MCH: 29.3 pg (ref 26.0–34.0)
MCHC: 33.4 g/dL (ref 30.0–36.0)
MCV: 87.5 fL (ref 78.0–100.0)
Platelets: 288 10*3/uL (ref 150–400)
RBC: 3.28 MIL/uL — AB (ref 4.22–5.81)
RDW: 14.6 % (ref 11.5–15.5)
WBC: 8.4 10*3/uL (ref 4.0–10.5)

## 2015-04-03 LAB — URINE CULTURE: Culture: 3000

## 2015-04-03 LAB — HEMOGLOBIN A1C
Hgb A1c MFr Bld: 5.6 % (ref 4.8–5.6)
Mean Plasma Glucose: 114 mg/dL

## 2015-04-03 LAB — VANCOMYCIN, TROUGH: Vancomycin Tr: 11 ug/mL (ref 10.0–20.0)

## 2015-04-03 MED ORDER — SODIUM CHLORIDE 0.9 % IV SOLN
INTRAVENOUS | Status: AC
  Administered 2015-04-03: 20:00:00 via INTRAVENOUS

## 2015-04-03 MED ORDER — DIAZEPAM 5 MG PO TABS
5.0000 mg | ORAL_TABLET | Freq: Once | ORAL | Status: AC
  Administered 2015-04-03: 5 mg via ORAL
  Filled 2015-04-03: qty 1

## 2015-04-03 MED ORDER — VANCOMYCIN HCL IN DEXTROSE 750-5 MG/150ML-% IV SOLN
750.0000 mg | Freq: Three times a day (TID) | INTRAVENOUS | Status: DC
  Administered 2015-04-04: 750 mg via INTRAVENOUS
  Filled 2015-04-03 (×3): qty 150

## 2015-04-03 MED ORDER — TRAZODONE HCL 50 MG PO TABS
25.0000 mg | ORAL_TABLET | Freq: Once | ORAL | Status: AC
  Administered 2015-04-04: 25 mg via ORAL
  Filled 2015-04-03: qty 1

## 2015-04-03 MED ORDER — POTASSIUM CHLORIDE CRYS ER 20 MEQ PO TBCR
40.0000 meq | EXTENDED_RELEASE_TABLET | Freq: Four times a day (QID) | ORAL | Status: AC
  Administered 2015-04-03 (×2): 40 meq via ORAL
  Filled 2015-04-03 (×2): qty 2

## 2015-04-03 MED ORDER — IPRATROPIUM-ALBUTEROL 0.5-2.5 (3) MG/3ML IN SOLN: 3.0000 mL | RESPIRATORY_TRACT | Status: DC | PRN

## 2015-04-03 MED ORDER — VANCOMYCIN HCL IN DEXTROSE 1-5 GM/200ML-% IV SOLN
1000.0000 mg | INTRAVENOUS | Status: AC
  Administered 2015-04-03: 1000 mg via INTRAVENOUS
  Filled 2015-04-03: qty 200

## 2015-04-03 MED ORDER — SODIUM CHLORIDE 0.9 % IV BOLUS (SEPSIS)
500.0000 mL | Freq: Once | INTRAVENOUS | Status: AC
  Administered 2015-04-03: 500 mL via INTRAVENOUS

## 2015-04-03 MED ORDER — TRAZODONE HCL 50 MG PO TABS
25.0000 mg | ORAL_TABLET | Freq: Every day | ORAL | Status: DC
  Administered 2015-04-03: 25 mg via ORAL
  Filled 2015-04-03: qty 1

## 2015-04-03 NOTE — Progress Notes (Addendum)
Pt has sustained HR at 156/min and recent BP is 118/71 mm of hg. Pt is asymptomatic. On-call provider Lamar Blinks, NP notified via text page. Awaiting orders.  At [place] has been decreased to 117/min at this moment. Order received to give 1 liter of NS over 2 hours. Will monitor closely.

## 2015-04-03 NOTE — Progress Notes (Signed)
Pharmacy Antibiotic Note  Malik Zuniga is a 70 y.o. male admitted on 04/01/2015 with sepsis.  Pharmacy has been consulted for Vancomycin and Cefepime dosing.  Plan: 70yo male on day #3 of Vancomycin and Cefepime.  Vancomycin trough drawn today is subtherapeutic and dose will require adjustment for goal trough 15-20.  Have spoken with RN, she will give Vanc '1000mg'$  dose that is on the unit, and then will change to a q8 dosing regimen  Height: '5\' 10"'$  (177.8 cm) Weight: 158 lb 11.2 oz (71.986 kg) IBW/kg (Calculated) : 73  Temp (24hrs), Avg:98.3 F (36.8 C), Min:97.6 F (36.4 C), Max:98.8 F (37.1 C)   Recent Labs Lab 04/01/15 1450 04/01/15 1511 04/02/15 0325 04/03/15 0345 04/03/15 1530  WBC 13.2*  --  11.9* 8.4  --   CREATININE 1.06  --  0.87 0.62  --   LATICACIDVEN  --  1.47  --   --   --   VANCOTROUGH  --   --   --   --  11    Estimated Creatinine Clearance: 88.8 mL/min (by C-G formula based on Cr of 0.62).    Allergies  Allergen Reactions  . Lisinopril Swelling    ANGIOEDEMA  . Lamictal [Lamotrigine] Other (See Comments)    Weakness/difficulty swallowing  . Ambien [Zolpidem] Other (See Comments)    Unknown reaction    Antimicrobials this admission: Vanc 2/19 >>  Cefepime 2/19 >>   Dose adjustments this admission: 2/19 - 2/21  Vancomycin '1000mg'$  IV q12 2/22 >>       Vancomycin '750mg'$  IV q8  Microbiology results: 2/19 BCx: ntd 2/9 UCx: ntd  2/20 MRSA PCR: neg  Thank you for allowing pharmacy to be a part of this patient's care.  Gracy Bruins, PharmD Clinical Pharmacist [facility]

## 2015-04-03 NOTE — Progress Notes (Signed)
[facility] PROGRESS NOTE   Malik Zuniga NFA:[id number] DOB: 05-May-1945 DOA: 04/01/2015 PCP: [name], MD  Subjective: Seen with wife at bedside, feels much better today, no fever or shortness of breath. We'll transfer to telemetry, PT/OT to evaluate, likely to be discharged in a.m. if he continues to improve.  HPI: Malik Zuniga is a 70 y.o. male with past medical history of NSCLC of RLL, recurrent aspiration pneumonia and chronic respiratory failure on 2 L of oxygen at home secondary to severe emphysema. Patient came to the hospital complaining about generalized weakness and fever. Patient was in his usual state of health until yesterday when he developed weakness, his wife reported that she had almost to carry him back to his bed from the bathroom. Then he developed fever, moderate cough without sputum production so she brought him to the hospital for further evaluation. All these information obtained from the EDP chart as no family at bedside, patient tells me he doesn't remember everything. In the ED CT scan of the chest was done and showed new oval 4.5 cm masslike density but favored to be infection, he has fever of 101.1 and heart rate of 124 when he comes. Admitted for further evaluation.  Assessment/Plan: Principal Problem:   Sepsis ([place]) Active Problems:   Essential hypertension   PROSTATE CANCER, HX OF   Recurrent aspiration pneumonia (HCC)   CAP (community acquired pneumonia)   Cancer of lower lobe of right lung ([place])   Chronic respiratory failure with hypoxia (HCC)   Sepsis Present on admission, heart rate of 124 and fever of 101.1 with presence of pneumonia. Patient started on broad-spectrum IV antibiotics and Tamiflu. Hydrated with IV fluids. Transfer to telemetry.  Community acquired pneumonia Left lower lobe pneumonia, did not appear on the x-ray, but CT scan showed oval masslike density favors infection. Started on cefepime, vancomycin, initially  was on Tamiflu, this was discontinued. Pro calcitonin is not elevated, flu is negative. Supportive management with bronchodilators, mucolytics, antitussives and oxygen.  Essential hypertension Hold lisinopril and HCTZ, continue beta blockers.  NSCLC of the RLL Patient has cancer diagnosed last October, per patient undergone radiation therapy. Follow-up with oncology/pulmonology as outpatient.  Chronic respiratory failure with hypoxia Secondary to emphysema, on 2 L of oxygen at home.  Hyponatremia Cannot rule out chronic hyponatremia related to the NSCLC.  Hypokalemia We'll replete with oral supplements.   Code Status: Full Code Family Communication: Plan discussed with the patient. Disposition Plan: Remains inpatient Diet: Diet Heart Room service appropriate?: Yes; Fluid consistency:: Thin  Consultants:  None  Procedures:  None  Antibiotics:  Cefepime and vancomycin   Objective: Filed Vitals:   04/03/15 0751 04/03/15 1200  BP: 112/67 125/73  Pulse: 111 99  Temp: 98.3 F (36.8 C) 98.1 F (36.7 C)  Resp: 23 22    Intake/Output Summary (Last 24 hours) at 04/03/15 1334 Last data filed at 04/03/15 0500  Gross per 24 hour  Intake   1903 ml  Output      0 ml  Net   1903 ml   Filed Weights   04/01/15 1439 04/02/15 1330  Weight: 72.576 kg (160 lb) 71.986 kg (158 lb 11.2 oz)    Exam: General: Alert and awake, oriented x3, not in any acute distress. HEENT: anicteric sclera, pupils reactive to light and accommodation, EOMI CVS: S1-S2 clear, no murmur rubs or gallops Chest: clear to auscultation bilaterally, no wheezing, rales or rhonchi Abdomen: soft nontender, nondistended, normal bowel sounds, no organomegaly  Extremities: no cyanosis, clubbing or edema noted bilaterally Neuro: Cranial nerves II-XII intact, no focal neurological deficits  Data Reviewed: Basic Metabolic Panel:  Recent Labs Lab 04/01/15 1450 04/02/15 0325 04/03/15 0345  NA 131* 135  131*  K 4.4 4.3 3.4*  CL 93* 98* 100*  CO2 22 23 21*  GLUCOSE 105* 109* 104*  BUN '12 8 8  '$ CREATININE 1.06 0.87 0.62  CALCIUM 9.6 8.9 8.6*   Liver Function Tests:  Recent Labs Lab 04/01/15 1450  AST 25  ALT 17  ALKPHOS 68  BILITOT 0.4  PROT 7.3  ALBUMIN 3.2*   No results for input(s): LIPASE, AMYLASE in the last 168 hours. No results for input(s): AMMONIA in the last 168 hours. CBC:  Recent Labs Lab 04/01/15 1450 04/02/15 0325 04/03/15 0345  WBC 13.2* 11.9* 8.4  NEUTROABS 10.6*  --   --   HGB 12.1* 11.0* 9.6*  HCT 36.2* 33.4* 28.7*  MCV 89.4 89.3 87.5  PLT 359 303 288   Cardiac Enzymes: No results for input(s): CKTOTAL, CKMB, CKMBINDEX, TROPONINI in the last 168 hours. BNP (last 3 results) No results for input(s): BNP in the last 8760 hours.  ProBNP (last 3 results) No results for input(s): PROBNP in the last 8760 hours.  CBG: No results for input(s): GLUCAP in the last 168 hours.  Micro Recent Results (from the past 240 hour(s))  Culture, blood (routine x 2)     Status: None (Preliminary result)   Collection Time: 04/01/15  2:50 PM  Result Value Ref Range Status   Specimen Description RIGHT ANTECUBITAL  Final   Special Requests BOTTLES DRAWN AEROBIC AND ANAEROBIC 5ML  Final   Culture NO GROWTH < 24 HOURS  Final   Report Status PENDING  Incomplete  Culture, blood (routine x 2)     Status: None (Preliminary result)   Collection Time: 04/01/15  3:20 PM  Result Value Ref Range Status   Specimen Description BLOOD RIGHT HAND  Final   Special Requests IN PEDIATRIC BOTTLE 1MLS  Final   Culture NO GROWTH < 24 HOURS  Final   Report Status PENDING  Incomplete  Urine culture     Status: None   Collection Time: 04/01/15  5:26 PM  Result Value Ref Range Status   Specimen Description URINE, CLEAN CATCH  Final   Special Requests NONE  Final   Culture 3,000 COLONIES/mL INSIGNIFICANT GROWTH  Final   Report Status 04/03/2015 FINAL  Final  MRSA PCR Screening      Status: None   Collection Time: 04/02/15  2:42 PM  Result Value Ref Range Status   MRSA by PCR NEGATIVE NEGATIVE Final    Comment:        The GeneXpert MRSA Assay (FDA approved for NASAL specimens only), is one component of a comprehensive MRSA colonization surveillance program. It is not intended to diagnose MRSA infection nor to guide or monitor treatment for MRSA infections.      Studies: Ct Angio Chest Pe W/cm &/or Wo Cm  04/01/2015  CLINICAL DATA:  Shortness of breath. Weakness. History of hypertension and emphysema. History of prostate cancer and lung cancer. EXAM: CT ANGIOGRAPHY CHEST WITH CONTRAST TECHNIQUE: Multidetector CT imaging of the chest was performed using the standard protocol during bolus administration of intravenous contrast. Multiplanar CT image reconstructions and MIPs were obtained to evaluate the vascular anatomy. CONTRAST:  142m OMNIPAQUE IOHEXOL 350 MG/ML SOLN COMPARISON:  Chest x-ray 04/01/2015, CT 12/01/2014 and 11/08/2014 FINDINGS: Heart: Coronary artery calcifications  are present. Heart size is normal. Vascular structures: Pulmonary arteries are well opacified. There is no evidence for acute pulmonary embolus. There is atherosclerotic calcification of the thoracic aorta. No aneurysm or evidence for dissection. Mediastinum/thyroid: The visualized portion of the thyroid gland has a normal appearance. Small lymph nodes are identified in the AP window region, measuring up to 1.0 cm. Precarinal lymph node is 1.7 cm. Left hilar lymph nodes are present and are asymmetric compared to those on the right, possibly secondary to left lower lobe process. Lungs/Airways: There is a left pleural effusion. Oval masslike density is identified at the left lung base measuring 2.7 x 4.5 cm. Spur present kidney finding. Left basilar atelectasis is present. Emphysematous changes are noted. There is an irregularly-shaped nodule in posterior aspect of the right lower lobe, measuring 0.9 x  1.0 cm on image 46 of series 5. Nodule appears slightly smaller compared prior study but is adjacent to atelectasis and small effusion possibly accounting for the changes. Upper abdomen: Status post cholecystectomy. Chest wall/osseous structures: Scoliosis with spinal fixation rods. No suspicious lytic or blastic lesions are identified. There is fusion of T8-9. Hardware has been removed from this fused level. Review of the MIP images confirms the above findings. IMPRESSION: 1. Persistent nodule within the posterior aspect of the right lower lobe measuring 1.0 cm maximum diameter. Pre biopsy the lesion measured 12 mm. The apparent change may be related to interval biopsy and/or mass adjacent to the atelectasis and small effusion today. 2. Left lower lobe oval density measuring 4.5 cm. Although somewhat masslike in configuration, findings are favored to be related to infectious process. Follow-up is recommended to exclude malignancy. There are associated small mediastinal and left hilar lymph nodes, most likely reactive. Electronically Signed   By: Nolon Nations M.D.   On: 04/01/2015 17:06   Dg Chest Port 1 View  04/01/2015  CLINICAL DATA:  Patient brought to ed by wife after collapsing today, patient presents with swelling in upper extremities and weakness. Patient recently finished radiation treatment for lung cancer in Lower left lobe EXAM: PORTABLE CHEST 1 VIEW COMPARISON:  Chest radiograph, 01/31/2015.  Chest CT:  11/08/2014. FINDINGS: Right lower lobe lung carcinoma is not evident on the current exam. It may be partly obscured by overlying wires. Lungs are clear.  No pleural effusion or pneumothorax. Heart, mediastinum and hila are unremarkable. IMPRESSION: No acute cardiopulmonary disease. Electronically Signed   By: Lajean Manes M.D.   On: 04/01/2015 15:19    Scheduled Meds: . ARIPiprazole  5 mg Oral Daily  . ceFEPime (MAXIPIME) IV  1 g Intravenous Q8H  . divalproex  1,000 mg Oral QHS  .  DULoxetine  30 mg Oral BID  . enoxaparin (LOVENOX) injection  40 mg Subcutaneous Q24H  . guaiFENesin  1,200 mg Oral BID  . ipratropium-albuterol  3 mL Nebulization Q4H  . metoprolol tartrate  50 mg Oral BID  . pantoprazole  40 mg Oral Daily  . sodium chloride flush  3 mL Intravenous Q12H  . vancomycin  1,000 mg Intravenous Q12H   Continuous Infusions: . sodium chloride 10 mL (04/02/15 0900)       Time spent: 35 minutes    [facility]  [facility] Pager [phone] If 7PM-7AM, please contact night-coverage at [email or web address], password [facility] 04/03/2015, 1:34 PM  LOS: 2 days

## 2015-04-04 DIAGNOSIS — A419 Sepsis, unspecified organism: Principal | ICD-10-CM

## 2015-04-04 LAB — CBC
HEMATOCRIT: 30.6 % — AB (ref 39.0–52.0)
Hemoglobin: 10.5 g/dL — ABNORMAL LOW (ref 13.0–17.0)
MCH: 30.7 pg (ref 26.0–34.0)
MCHC: 34.3 g/dL (ref 30.0–36.0)
MCV: 89.5 fL (ref 78.0–100.0)
PLATELETS: 291 10*3/uL (ref 150–400)
RBC: 3.42 MIL/uL — ABNORMAL LOW (ref 4.22–5.81)
RDW: 14.5 % (ref 11.5–15.5)
WBC: 6.4 10*3/uL (ref 4.0–10.5)

## 2015-04-04 LAB — BASIC METABOLIC PANEL
Anion gap: 12 (ref 5–15)
CHLORIDE: 100 mmol/L — AB (ref 101–111)
CO2: 22 mmol/L (ref 22–32)
CREATININE: 0.52 mg/dL — AB (ref 0.61–1.24)
Calcium: 8.8 mg/dL — ABNORMAL LOW (ref 8.9–10.3)
GFR calc Af Amer: >60 mL/min (ref >60)
GFR calc non Af Amer: >60 mL/min (ref >60)
GLUCOSE: 94 mg/dL (ref 65–99)
POTASSIUM: 3.9 mmol/L (ref 3.5–5.1)
SODIUM: 134 mmol/L — AB (ref 135–145)

## 2015-04-04 MED ORDER — LEVOFLOXACIN 500 MG PO TABS: 500.0000 mg | ORAL_TABLET | Freq: Every day | ORAL | Status: DC

## 2015-04-04 NOTE — Care Management Important Message (Signed)
Important Message  Patient Details  Name: Malik Zuniga MRN: [id number] Date of Birth: [birth year]   Medicare Important Message Given:  Yes    Nathen May 04/04/2015, 12:02 PM

## 2015-04-04 NOTE — Evaluation (Signed)
Physical Therapy Evaluation Patient Details Name: Malik Zuniga MRN: [id number] DOB: 1945/03/15 Today's Date: 04/04/2015   History of Present Illness  Pt adm with sepsis and PNA. PMH - lung CA, brain aneurysm with craniotomy, prostate CA, emphysema, anxiety/depression, HTN, MI  Clinical Impression  Pt admitted with above diagnosis. Pt close to his baseline and pt/wife feel comfortable with him returning home without further need for therapy. Pt/wife very familiar with pt's limitations and have had PT many times in the past.    Follow Up Recommendations No PT follow up    Equipment Recommendations  None recommended by PT    Recommendations for Other Services       Precautions / Restrictions Precautions Precautions: Fall Restrictions Weight Bearing Restrictions: No      Mobility  Bed Mobility Overal bed mobility: Needs Assistance Bed Mobility: Supine to Sit     Supine to sit: Supervision        Transfers Overall transfer level: Needs assistance Equipment used: Rolling walker (2 wheeled) Transfers: Sit to/from Stand Sit to Stand: Min guard         General transfer comment: Assist for balance and vc for hand placement  Ambulation/Gait Ambulation/Gait assistance: Min guard Ambulation Distance (Feet): 200 Feet Assistive device: Rolling walker (2 wheeled) Gait Pattern/deviations: Step-through pattern;Decreased stride length;Trunk flexed Gait velocity: decr Gait velocity interpretation: Below normal speed for age/gender General Gait Details: Unsteady gait with walker but no overt loss of balance. Pt with history of falls due to balance deficits. Dyspnea 3/4. SpO2 90-92% on RA  Stairs            Wheelchair Mobility    Modified Rankin (Stroke Patients Only)       Balance Overall balance assessment: Needs assistance Sitting-balance support: No upper extremity supported;Feet supported Sitting balance-Leahy Scale: Good     Standing balance support:  Bilateral upper extremity supported Standing balance-Leahy Scale: Poor Standing balance comment: support of walker and supervision                             Pertinent Vitals/Pain Pain Assessment: No/denies pain    Home Living Family/patient expects to be discharged to:: Private residence Living Arrangements: Spouse/significant other Available Help at Discharge: Family;Available 24 hours/day Type of Home: House Home Access: Stairs to enter Entrance Stairs-Rails: Right Entrance Stairs-Number of Steps: 5 Home Layout: One level Home Equipment: Walker - 2 wheels;Walker - 4 wheels;Cane - single point;Bedside commode;Grab bars - tub/shower;Tub bench;Wheelchair - manual      Prior Function Level of Independence: Needs assistance   Gait / Transfers Assistance Needed: Amb with walker. Uses w/c in the community           Hand Dominance   Dominant Hand: Right    Extremity/Trunk Assessment   Upper Extremity Assessment: Defer to OT evaluation           Lower Extremity Assessment: Generalized weakness         Communication   Communication: No difficulties  Cognition Arousal/Alertness: Awake/alert Behavior During Therapy: WFL for tasks assessed/performed Overall Cognitive Status: Within Functional Limits for tasks assessed                      General Comments      Exercises        Assessment/Plan    PT Assessment Patent does not need any further PT services (Pt for dc today)  PT Diagnosis Difficulty walking;Generalized  weakness   PT Problem List    PT Treatment Interventions     PT Goals (Current goals can be found in the Care Plan section) Acute Rehab PT Goals PT Goal Formulation: All assessment and education complete, DC therapy    Frequency     Barriers to discharge        Co-evaluation               End of Session Equipment Utilized During Treatment: Gait belt Activity Tolerance: Patient tolerated treatment  well Patient left: in chair;with call bell/phone within reach;with chair alarm set;with family/visitor present Nurse Communication: Mobility status         Time: 3794-4461 PT Time Calculation (min) (ACUTE ONLY): 17 min   Charges:   PT Evaluation $PT Eval Moderate Complexity: 1 Procedure     PT G Codes:        Larosa Rhines 2015-04-24, 10:24 AM Suanne Marker PT [phone]

## 2015-04-04 NOTE — Discharge Summary (Signed)
Physician Discharge Summary  TRACKER MANCE YKD:[id number] DOB: 1945/09/02 DOA: 04/01/2015  PCP: [name], MD  Admit date: 04/01/2015 Discharge date: 04/04/2015  Time spent: > 30 minutes  Recommendations for Outpatient Follow-up:  1. Follow up with PCP in 1-2 weeks 2. 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 male with past medical history of NSCLC of RLL, recurrent aspiration pneumonia and chronic respiratory failure on 2 L of oxygen at home secondary to severe emphysema. Patient came to the hospital complaining about generalized weakness and fever. Patient was in his usual state of health until yesterday when he developed weakness, his wife reported that she had almost to carry him back to his bed from the bathroom. Then he developed fever, moderate cough without sputum production so she brought him to the hospital for further evaluation. All these information obtained from the EDP chart as no family at bedside, patient tells me he doesn't remember everything. In the ED CT scan of the chest was done and showed new oval 4.5 cm masslike density but favored to be infection, he has fever of 101.1 and heart rate of 124 when he comes. Admitted for further evaluation.  Hospital Course:  Sepsis due to CAP Present on admission, heart rate of 124 and fever of 101.1 with presence of pneumonia. Left lower lobe pneumonia, did not appear on the x-ray, but CT scan showed oval  masslike density favors infection. He was  started on broad-spectrum IV antibiotics and Tamiflu, the latter one was discontinued was his influenza panel came back negative. Patient improved clinically with supportive measures, his sepsis physiology resolved, he was afebrile, his leukocytosis resolved, and patient was asking to go home. He was transitioned to Levaquin and he is to complete the treatment as an outpatient. Essential hypertension  NSCLC of the RLL Patient has cancer diagnosed last October, per patient undergone radiation therapy. Follow-up with oncology/pulmonology as outpatient. Chronic respiratory failure with hypoxia Secondary to emphysema, on 2 L of oxygen at home. Hyponatremia Cannot rule out chronic hyponatremia related to the NSCLC, stable Hypokalemia We'll replete with oral supplements.  Procedures:  None    Consultations:  None   Discharge Exam: Filed Vitals:   04/03/15 1856 04/03/15 1925 04/03/15 2036 04/04/15 0534  BP: 118/71  152/76 139/77  Pulse: 156  118 98  Temp:   97.9 F (36.6 C) 97.8 F (36.6 C)  TempSrc:   Oral Oral  Resp:  '20 18 18  '$ Height:      Weight:      SpO2:   92% 95%    General: NAD Cardiovascular: RRR Respiratory: moves air well, minimal wheezing  Discharge Instructions Activity:  As tolerated   Get Medicines reviewed and adjusted: Please take all your medications with you for your next visit with your Primary MD  Please request your Primary MD to go over all hospital tests and procedure/radiological results at the follow up, please ask your Primary  MD to get all Hospital records sent to his/her office.  If you experience worsening of your admission symptoms, develop shortness of breath, life threatening emergency, suicidal or homicidal thoughts you must seek medical attention immediately by calling 911 or calling your MD immediately if symptoms less severe.  You must read complete instructions/literature along with all the  possible adverse reactions/side effects for all the Medicines you take and that have been prescribed to you. Take any new Medicines after you have completely understood and accpet all the possible adverse reactions/side effects.   Do not drive when taking Pain medications.   Do not take more than prescribed Pain, Sleep and Anxiety Medications  Special Instructions: If you have smoked or chewed Tobacco in the last 2 yrs please stop smoking, stop any regular Alcohol and or any Recreational drug use.  Wear Seat belts while driving.  Please note  You were cared for by a hospitalist during your hospital stay. Once you are discharged, your primary care physician will handle any further medical issues. Please note that NO REFILLS for any discharge medications will be authorized once you are discharged, as it is imperative that you return to your primary care physician (or establish a relationship with a primary care physician if you do not have one) for your aftercare needs so that they can reassess your need for medications and monitor your lab values. Discharge Instructions    AMB Referral to [facility]    Complete by:  As directed   Reason for consult:  Post hospital transition of care - was active prior to admission with health coach  Expected date of contact:  1-3 days (reserved for hospital discharges)  Please assign to community nurse for transition of care calls and assess for home visits.  Patient was previously active with [facility] prior to admission. Questions please call:  Natividad Brood, RN BSN [facility] Liaison  [phone] business mobile phone Toll free office [phone]            Medication List    TAKE these medications        ARIPiprazole 10 MG tablet  Commonly known as:  ABILIFY  Take 5 mg by mouth daily.     aspirin-acetaminophen-caffeine 250-250-65 MG tablet  Commonly known as:  EXCEDRIN MIGRAINE  Take 2 tablets by mouth  every 6 (six) hours as needed. For headaches     benzonatate 100 MG capsule  Commonly known as:  TESSALON  Take 1 capsule (100 mg total) by mouth 3 (three) times daily as needed for cough.     budesonide 0.25 MG/2ML nebulizer solution  Commonly known as:  PULMICORT  Take 4 mLs (0.5 mg total) by nebulization 2 (two) times daily.     cetirizine 10 MG tablet  Commonly known as:  ZYRTEC  Take 1 tablet (10 mg total) by mouth daily.     cholecalciferol 1000 units tablet  Commonly known as:  VITAMIN D  Take 1,000 Units by mouth every morning.     diazepam 5 MG tablet  Commonly known as:  VALIUM  Take 5 mg by mouth 3 (three) times daily. scheduled     divalproex 500 MG 24 hr tablet  Commonly known as:  DEPAKOTE ER  Take 1,000 mg by mouth at bedtime.     docusate sodium 100 MG capsule  Commonly known as:  COLACE  Take 200 mg by mouth at bedtime.     DULoxetine 60 MG capsule  Commonly known as:  CYMBALTA  Take 60 mg by mouth daily.     fluticasone 50 MCG/ACT nasal spray  Commonly known as:  FLONASE  PLACE 2 SPRAYS INTO BOTH NOSTRILS DAILY.     HYDROcodone-acetaminophen 10-325 MG tablet  Commonly known as:  NORCO  Take 1 tablet by mouth every 6 (six) hours as needed for moderate pain. for pain     ipratropium-albuterol 0.5-2.5 (3) MG/3ML Soln  Commonly known as:  DUONEB  Take 3 mLs by nebulization every 6 (six) hours.     levofloxacin 500 MG tablet  Commonly known as:  LEVAQUIN  Take 1 tablet (500 mg total) by mouth daily.     losartan-hydrochlorothiazide 50-12.5 MG tablet  Commonly known as:  HYZAAR  TAKE 1 TABLET BY MOUTH DAILY.     metoprolol succinate 50 MG 24 hr tablet  Commonly known as:  TOPROL-XL  TAKE 1 TABLET (50 MG TOTAL) BY MOUTH EVERY MORNING. TAKE WITH OR IMMEDIATELY FOLLOWING A MEAL.     montelukast 10 MG tablet  Commonly known as:  SINGULAIR  TAKE 1 TABLET BY MOUTH AT BEDTIME     NITROSTAT 0.4 MG SL tablet  Generic drug:  nitroGLYCERIN  PLACE 1  TABLET (0.4 MG TOTAL) UNDER THE TONGUE EVERY 5 (FIVE) MINUTES AS NEEDED FOR CHEST PAIN.     omeprazole 40 MG capsule  Commonly known as:  PRILOSEC  Take 40 mg by mouth 2 (two) times daily.     polyethylene glycol packet  Commonly known as:  MIRALAX / GLYCOLAX  Take 17 g by mouth 3 (three) times daily as needed for moderate constipation. For constipation     primidone 250 MG tablet  Commonly known as:  MYSOLINE  TAKE 1 TABLET (250 MG TOTAL) BY MOUTH 2 (TWO) TIMES DAILY.     promethazine 25 MG tablet  Commonly known as:  PHENERGAN  Take 25 mg by mouth every 6 (six) hours as needed. for nausea     simvastatin 40 MG tablet  Commonly known as:  ZOCOR  TAKE 1 TABLET (40 MG TOTAL) BY MOUTH EVERY EVENING.     tiZANidine 4 MG tablet  Commonly known as:  ZANAFLEX  TAKE 1 TABLET BY MOUTH EVERY 8 HOURS AS NEEDED FOR MUSCLE SPASMS.     vitamin C 1000 MG tablet  Take 1,000 mg by mouth daily.     Zinc 50 MG Tabs  Take 50 mg by mouth daily.           Follow-up Information    Follow up with [name], MD. Schedule an appointment as soon as possible for a visit on 04/10/2015.   Specialty:  Family Medicine   Why:  Appointment with Dr. [name] is on 04/10/15 at [place] information:   [place] [address] [phone]       The results of significant diagnostics from this hospitalization (including imaging, microbiology, ancillary and laboratory) are listed below for reference.    Significant Diagnostic Studies: Ct Angio Chest Pe W/cm &/or Wo Cm  04/01/2015  CLINICAL DATA:  Shortness of breath. Weakness. History of hypertension and emphysema. History of prostate cancer and lung cancer. EXAM: CT ANGIOGRAPHY CHEST WITH CONTRAST TECHNIQUE: Multidetector CT imaging of the chest was performed using the standard protocol during bolus administration of intravenous contrast. Multiplanar CT image reconstructions and MIPs were obtained to evaluate the  vascular anatomy. CONTRAST:  130m OMNIPAQUE IOHEXOL 350 MG/ML SOLN COMPARISON:  Chest x-ray 04/01/2015, CT 12/01/2014 and 11/08/2014 FINDINGS: Heart: Coronary artery calcifications are present. Heart size is normal. Vascular structures: Pulmonary arteries are well opacified. There is no evidence for acute pulmonary embolus. There is atherosclerotic calcification of the thoracic aorta. No aneurysm or evidence for dissection. Mediastinum/thyroid: The visualized portion of the thyroid gland has a normal appearance. Small lymph nodes are identified in the AP window region, measuring up to 1.0 cm. Precarinal lymph node is 1.7 cm. Left hilar lymph nodes are present and are asymmetric compared to those on the right, possibly secondary to left lower lobe process. Lungs/Airways: There is a left pleural effusion. Oval masslike density is identified at the left lung base measuring 2.7 x 4.5 cm. Spur present kidney finding. Left basilar atelectasis is present. Emphysematous changes are noted. There is an irregularly-shaped nodule in posterior aspect of the right lower lobe, measuring 0.9 x 1.0 cm on image 46 of series 5. Nodule appears slightly smaller compared prior study but is adjacent to atelectasis and small effusion possibly accounting for the changes. Upper abdomen: Status post cholecystectomy. Chest wall/osseous structures: Scoliosis with spinal fixation rods. No suspicious lytic or blastic lesions are identified. There is fusion of T8-9. Hardware has been removed from this fused level. Review of the MIP images confirms the above findings. IMPRESSION: 1. Persistent nodule within the posterior aspect of the right lower lobe measuring 1.0 cm maximum diameter. Pre biopsy the lesion measured 12 mm. The apparent change may be related to interval biopsy and/or mass adjacent to the atelectasis and small effusion today. 2. Left lower lobe oval density measuring 4.5 cm. Although somewhat masslike in configuration, findings are  favored to be related to infectious process. Follow-up is recommended to exclude malignancy. There are associated small mediastinal and left hilar lymph nodes, most likely reactive. Electronically Signed   By: Nolon Nations M.D.   On: 04/01/2015 17:06   Dg Chest Port 1 View  04/01/2015  CLINICAL DATA:  Patient brought to ed by wife after collapsing today, patient presents with swelling in upper extremities and weakness. Patient recently finished radiation treatment for lung cancer in Lower left lobe EXAM: PORTABLE CHEST 1 VIEW COMPARISON:  Chest radiograph, 01/31/2015.  Chest CT:  11/08/2014. FINDINGS: Right lower lobe lung carcinoma is not evident on the current exam. It may be partly obscured by overlying wires. Lungs are clear.  No pleural effusion or pneumothorax. Heart, mediastinum and hila are unremarkable. IMPRESSION: No acute cardiopulmonary disease. Electronically Signed   By: Lajean Manes M.D.   On: 04/01/2015 15:19    Microbiology: Recent Results (from the past 240 hour(s))  Culture, blood (routine x 2)     Status: None (Preliminary result)   Collection Time: 04/01/15  2:50 PM  Result Value Ref Range Status   Specimen Description BLOOD RIGHT ANTECUBITAL  Final   Special Requests BOTTLES DRAWN AEROBIC AND ANAEROBIC 5ML  Final   Culture NO GROWTH 3 DAYS  Final   Report Status PENDING  Incomplete  Culture, blood (routine x 2)     Status: None (Preliminary result)   Collection Time: 04/01/15  3:20 PM  Result Value Ref Range Status   Specimen Description BLOOD RIGHT HAND  Final   Special Requests IN PEDIATRIC BOTTLE 1MLS  Final   Culture NO GROWTH 3 DAYS  Final   Report Status PENDING  Incomplete  Urine culture     Status: None   Collection Time: 04/01/15  5:26 PM  Result Value Ref Range Status  Specimen Description URINE, CLEAN CATCH  Final   Special Requests NONE  Final   Culture 3,000 COLONIES/mL INSIGNIFICANT GROWTH  Final   Report Status 04/03/2015 FINAL  Final  MRSA PCR  Screening     Status: None   Collection Time: 04/02/15  2:42 PM  Result Value Ref Range Status   MRSA by PCR NEGATIVE NEGATIVE Final    Comment:        The GeneXpert MRSA Assay (FDA approved for NASAL specimens only), is one component of a comprehensive MRSA colonization surveillance program. It is not intended to diagnose MRSA infection nor to guide or monitor treatment for MRSA infections.      Labs: Basic Metabolic Panel:  Recent Labs Lab 04/01/15 1450 04/02/15 0325 04/03/15 0345 04/04/15 0650  NA 131* 135 131* 134*  K 4.4 4.3 3.4* 3.9  CL 93* 98* 100* 100*  CO2 22 23 21* 22  GLUCOSE 105* 109* 104* 94  BUN '12 8 8 '$ <5*  CREATININE 1.06 0.87 0.62 0.52*  CALCIUM 9.6 8.9 8.6* 8.8*   Liver Function Tests:  Recent Labs Lab 04/01/15 1450  AST 25  ALT 17  ALKPHOS 68  BILITOT 0.4  PROT 7.3  ALBUMIN 3.2*   CBC:  Recent Labs Lab 04/01/15 1450 04/02/15 0325 04/03/15 0345 04/04/15 0650  WBC 13.2* 11.9* 8.4 6.4  NEUTROABS 10.6*  --   --   --   HGB 12.1* 11.0* 9.6* 10.5*  HCT 36.2* 33.4* 28.7* 30.6*  MCV 89.4 89.3 87.5 89.5  PLT 359 303 288 291     Signed:  Marzetta Board MD [facility] 04/04/2015, 5:34 PM

## 2015-04-04 NOTE — Discharge Instructions (Signed)
Follow with [name], MD in 5-7 days  Please get a complete blood count and chemistry panel checked by your Primary MD at your next visit, and again as instructed by your Primary MD. Please get your medications reviewed and adjusted by your Primary MD.  Please request your Primary MD to go over all Hospital Tests and Procedure/Radiological results at the follow up, please get all Hospital records sent to your Prim MD by signing hospital release before you go home.  If you had Pneumonia of Lung problems at the Hospital: Please get a 2 view Chest X ray done in 6-8 weeks after hospital discharge or sooner if instructed by your Primary MD.  If you have Congestive Heart Failure: Please call your Cardiologist or Primary MD anytime you have any of the following symptoms:  1) 3 pound weight gain in 24 hours or 5 pounds in 1 week  2) shortness of breath, with or without a dry hacking cough  3) swelling in the hands, feet or stomach  4) if you have to sleep on extra pillows at night in order to breathe  Follow cardiac low salt diet and 1.5 lit/day fluid restriction.  If you have diabetes Accuchecks 4 times/day, Once in AM empty stomach and then before each meal. Log in all results and show them to your primary doctor at your next visit. If any glucose reading is under 80 or above 300 call your primary MD immediately.  If you have Seizure/Convulsions/Epilepsy: Please do not drive, operate heavy machinery, participate in activities at heights or participate in high speed sports until you have seen by Primary MD or a Neurologist and advised to do so again.  If you had Gastrointestinal Bleeding: Please ask your Primary MD to check a complete blood count within one week of discharge or at your next visit. Your endoscopic/colonoscopic biopsies that are pending at the time of discharge, will also need to followed by your Primary MD.  Get Medicines reviewed and adjusted. Please take all your  medications with you for your next visit with your Primary MD  Please request your Primary MD to go over all hospital tests and procedure/radiological results at the follow up, please ask your Primary MD to get all Hospital records sent to his/her office.  If you experience worsening of your admission symptoms, develop shortness of breath, life threatening emergency, suicidal or homicidal thoughts you must seek medical attention immediately by calling 911 or calling your MD immediately  if symptoms less severe.  You must read complete instructions/literature along with all the possible adverse reactions/side effects for all the Medicines you take and that have been prescribed to you. Take any new Medicines after you have completely understood and accpet all the possible adverse reactions/side effects.   Do not drive or operate heavy machinery when taking Pain medications.   Do not take more than prescribed Pain, Sleep and Anxiety Medications  Special Instructions: If you have smoked or chewed Tobacco  in the last 2 yrs please stop smoking, stop any regular Alcohol  and or any Recreational drug use.  Wear Seat belts while driving.  Please note You were cared for by a hospitalist during your hospital stay. If you have any questions about your discharge medications or the care you received while you were in the hospital after you are discharged, you can call the unit and asked to speak with the hospitalist on call if the hospitalist that took care of you is not available. Once  you are discharged, your primary care physician will handle any further medical issues. Please note that NO REFILLS for any discharge medications will be authorized once you are discharged, as it is imperative that you return to your primary care physician (or establish a relationship with a primary care physician if you do not have one) for your aftercare needs so that they can reassess your need for medications and monitor your  lab values.  You can reach the hospitalist office at phone [phone] or fax [phone]   If you do not have a primary care physician, you can call [phone] for a physician referral.  Activity: As tolerated with Full fall precautions use walker/cane & assistance as needed  Diet: regular  Disposition Home

## 2015-04-04 NOTE — Progress Notes (Signed)
NURSING PROGRESS NOTE  Malik Zuniga [id number] Discharge Data: 04/04/2015 10:53 AM Attending Provider: Caren Griffins, MD ACG:[id number] [name], MD   [name] to be D/C'd Home per MD order.    All IV's will be discontinued and monitored for bleeding.  All belongings will be returned to patient for patient to take home.  Last Documented Vital Signs:  Blood pressure 139/77, pulse 98, temperature 97.8 F (36.6 C), temperature source Oral, resp. rate 18, height '5\' 10"'$  (1.778 m), weight 71.986 kg (158 lb 11.2 oz), SpO2 95 %.  Joslyn Hy, MSN, RN, [employer]

## 2015-04-04 NOTE — Consult Note (Signed)
   [facility] Inpatient Consult   04/04/2015  Malik Zuniga 05-25-1945 [id number] Patient was active with [facility].  Came to see patient. Patient had discharged prior to this writer seeing him.  Will have [place] to follow up with patient. For questions, please contact: Natividad Brood, RN BSN [facility] Liaison  [phone] business mobile phone Toll free office [phone]

## 2015-04-05 ENCOUNTER — Telehealth: Payer: Self-pay | Admitting: *Deleted

## 2015-04-05 NOTE — Telephone Encounter (Signed)
Transition Care Management Follow-up Telephone Call Completed w/ pt's wife, as pt was sleeping at time of call.   PCP: [name], MD  Admit date: 04/01/2015 Discharge date: 04/04/2015  Recommendations for Outpatient Follow-up:  1. Follow up with PCP in 1-2 weeks 2. 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 "He's tired and weak. We're going to put him back on his walker rather than having him use his cane for now. We're just so relieved to be out of the hospital and alive."   Do you understand why you were in the hospital? yes   Do you understand the discharge instructions? yes   Where were you discharged to? Home   Items Reviewed:  Medications reviewed: yes  Allergies reviewed: yes  Dietary changes reviewed: no, no dietary changes made  Referrals reviewed: no   Functional Questionnaire:   Activities of Daily Living (ADLs):   He states they are independent in the following: ambulation, feeding, continence, grooming, toileting and dressing States they require assistance with the following: bathing and hygiene (pt requires assistance getting in and out of the shower but can bathe himself independently)   Any transportation issues/concerns?: no   Any patient concerns? no   Confirmed importance and date/time of follow-up visits scheduled yes  Provider Appointment booked with Dr. [name] 04/10/15 at 11:00am  Confirmed with patient if condition begins to worsen call PCP or go to the ER.  Patient was given the office number and encouraged  to call back with question or concerns.  : yes, "I have you guys on speed dial."

## 2015-04-05 NOTE — Addendum Note (Signed)
Addended by: Dorrene German on: 04/05/2015 10:40 AM   Modules accepted: Orders, Medications

## 2015-04-06 ENCOUNTER — Other Ambulatory Visit: Payer: Self-pay | Admitting: *Deleted

## 2015-04-06 LAB — CULTURE, BLOOD (ROUTINE X 2)
Culture: NO GROWTH
Culture: NO GROWTH

## 2015-04-06 NOTE — Patient Outreach (Signed)
Referral received from hospital liaison to begin transition of care program once discharged.  Member admitted to hospital for sepsis, discharged on 2/22.  According to chart, he also has history of hypertension, prostate and lungs cancer, pneumonia, and COPD.    Call placed to member, but his wife answers the phone and state that he is asleep.  She verifies member's identity.  She report that the member has become progressively weaker over the past couple weeks and is requiring more assistance.  She state that he has finished his current course of radiation and will have another scan on 3/6.  They will go to the doctor on 3/9 for results.  She report that he has a hospital follow up appointment on 2/28.    Wife report that the member has been complaining today of some abdominal pain.  He denies any nausea/vomiting/diarrhea, only pain.  He does have a GI specialist, and she state that if his pain does not subside she will contact him.    Medications reviewed, she denies any concerns regarding administration, but does state that the member's nebulizer medication can be "a little pricey."  Will have pharmacist review medications and determine if there is an alternative way to afford medicine.   Wife otherwise denies any concerns.  Initial home visit scheduled, will continue with weekly transition of care calls next week.  Valente Gannon, BSN, [facility]  [facility] Care Manager [phone]

## 2015-04-10 ENCOUNTER — Ambulatory Visit (INDEPENDENT_AMBULATORY_CARE_PROVIDER_SITE_OTHER): Payer: Medicare Other | Admitting: Family Medicine

## 2015-04-10 ENCOUNTER — Encounter: Payer: Self-pay | Admitting: Family Medicine

## 2015-04-10 VITALS — BP 114/66 | HR 100 | Temp 98.0°F | Resp 17

## 2015-04-10 DIAGNOSIS — J690 Pneumonitis due to inhalation of food and vomit: Secondary | ICD-10-CM | POA: Diagnosis not present

## 2015-04-10 DIAGNOSIS — F3170 Bipolar disorder, currently in remission, most recent episode unspecified: Secondary | ICD-10-CM

## 2015-04-10 DIAGNOSIS — F319 Bipolar disorder, unspecified: Secondary | ICD-10-CM | POA: Diagnosis not present

## 2015-04-10 LAB — HEPATIC FUNCTION PANEL
ALT: 19 U/L (ref 0–53)
AST: 21 U/L (ref 0–37)
Albumin: 3.8 g/dL (ref 3.5–5.2)
Alkaline Phosphatase: 55 U/L (ref 39–117)
BILIRUBIN TOTAL: 0.2 mg/dL (ref 0.2–1.2)
Bilirubin, Direct: 0 mg/dL (ref 0.0–0.3)
Total Protein: 7.5 g/dL (ref 6.0–8.3)

## 2015-04-10 LAB — CBC WITH DIFFERENTIAL/PLATELET
Basophils Absolute: 0 10*3/uL (ref 0.0–0.1)
Basophils Relative: 0.6 % (ref 0.0–3.0)
EOS PCT: 1.9 % (ref 0.0–5.0)
Eosinophils Absolute: 0.1 10*3/uL (ref 0.0–0.7)
HEMATOCRIT: 36.9 % — AB (ref 39.0–52.0)
HEMOGLOBIN: 12.2 g/dL — AB (ref 13.0–17.0)
LYMPHS ABS: 1.6 10*3/uL (ref 0.7–4.0)
LYMPHS PCT: 33 % (ref 12.0–46.0)
MCHC: 33 g/dL (ref 30.0–36.0)
MCV: 90.4 fl (ref 78.0–100.0)
MONOS PCT: 6.4 % (ref 3.0–12.0)
Monocytes Absolute: 0.3 10*3/uL (ref 0.1–1.0)
Neutro Abs: 2.9 10*3/uL (ref 1.4–7.7)
Neutrophils Relative %: 58.1 % (ref 43.0–77.0)
Platelets: 660 10*3/uL — ABNORMAL HIGH (ref 150.0–400.0)
RBC: 4.08 Mil/uL — AB (ref 4.22–5.81)
RDW: 16.2 % — ABNORMAL HIGH (ref 11.5–15.5)
WBC: 5 10*3/uL (ref 4.0–10.5)

## 2015-04-10 LAB — BASIC METABOLIC PANEL
BUN: 21 mg/dL (ref 6–23)
CO2: 25 mEq/L (ref 19–32)
Calcium: 9.8 mg/dL (ref 8.4–10.5)
Chloride: 97 mEq/L (ref 96–112)
Creatinine, Ser: 0.82 mg/dL (ref 0.40–1.50)
GFR: 98.78 mL/min (ref >60.00)
GLUCOSE: 95 mg/dL (ref 70–99)
POTASSIUM: 5 meq/L (ref 3.5–5.1)
SODIUM: 131 meq/L — AB (ref 135–145)

## 2015-04-10 LAB — TSH: TSH: 1.27 u[IU]/mL (ref 0.35–4.50)

## 2015-04-10 NOTE — Patient Instructions (Signed)
Follow up as scheduled We'll notify you of your lab results and make any changes if needed We'll fax the labs to [name] Keep up the good work- you look great! Call with any questions or concerns Allow yourself time to recover- it will take a while! Hang in there!!!

## 2015-04-10 NOTE — Assessment & Plan Note (Signed)
Chronic problem.  Following w/ [name].  Will get Depokote level at psych's request.

## 2015-04-10 NOTE — Progress Notes (Signed)
   Subjective:    Patient ID: Malik Zuniga, male    DOB: April 26, 1945, 70 y.o.   MRN: [id number]  [facility] f/u- pt was admitted 2/19-22 due to sepsis from PNA.  Pt completed his 5 day outpt course of Levaquin yesterday.  Pt reports he wears 2.5L O2 at night.  He had 4 falls upon first returning home- thankfully no injuries.  Has not fallen in the last few days- now using walker to get to bathroom.  Pt reports 'breathing is fine' but 'exhausted'.  No longer coughing.  No fevers.  Psych is requesting depakote level w/ other labs ([name])   Review of Systems For ROS see HPI     Objective:   Physical Exam  Constitutional: He is oriented to person, place, and time. He appears well-developed. No distress.  Thin elderly man sitting in wheel chair  HENT:  Head: Normocephalic and atraumatic.  Eyes: Conjunctivae and EOM are normal. Pupils are equal, round, and reactive to light.  Cardiovascular: Normal rate, regular rhythm and normal heart sounds.   Pulmonary/Chest: Effort normal and breath sounds normal. No respiratory distress. He has no wheezes. He has no rales.  Neurological: He is alert and oriented to person, place, and time.  Skin: Skin is warm and dry.  Psychiatric: He has a normal mood and affect. His behavior is normal. Thought content normal.  Vitals reviewed.         Assessment & Plan:

## 2015-04-10 NOTE — Progress Notes (Signed)
Pre visit review using our clinic review tool, if applicable. No additional management support is needed unless otherwise documented below in the visit note. 

## 2015-04-10 NOTE — Assessment & Plan Note (Signed)
Ongoing issue for pt.  Unclear if this was the cause of his most recent PNA/sepsis.  Pt completed his outpt course of Levaquin and reports feeling quite well w/ exception of fatigue.  Pt had low Na and K+ while hospitalized- will check both to make sure they are stable or have normalized.  Pt expressed understanding and is in agreement w/ plan.

## 2015-04-11 ENCOUNTER — Other Ambulatory Visit: Payer: Self-pay | Admitting: Family Medicine

## 2015-04-11 DIAGNOSIS — D691 Qualitative platelet defects: Secondary | ICD-10-CM

## 2015-04-11 LAB — VALPROIC ACID LEVEL: Valproic Acid Lvl: 33.4 ug/mL — ABNORMAL LOW (ref 50.0–100.0)

## 2015-04-13 ENCOUNTER — Other Ambulatory Visit: Payer: Self-pay | Admitting: *Deleted

## 2015-04-13 ENCOUNTER — Ambulatory Visit: Payer: Self-pay | Admitting: *Deleted

## 2015-04-13 NOTE — Patient Outreach (Signed)
Weekly transition of care call placed to member.  No answer, HIPPA compliant voice message left.  Will await call back, will continue with weekly calls next week.  Valente Kayman, BSN, [facility]  [facility] Care Manager [phone]

## 2015-04-16 ENCOUNTER — Encounter: Payer: Self-pay | Admitting: Pulmonary Disease

## 2015-04-16 ENCOUNTER — Ambulatory Visit (INDEPENDENT_AMBULATORY_CARE_PROVIDER_SITE_OTHER): Payer: Medicare Other | Admitting: Pulmonary Disease

## 2015-04-16 ENCOUNTER — Ambulatory Visit (HOSPITAL_COMMUNITY)
Admission: RE | Admit: 2015-04-16 | Discharge: 2015-04-16 | Disposition: A | Discharge status: Home / Self Care | Payer: Medicare Other | Source: Ambulatory Visit | Attending: Radiation Oncology | Admitting: Radiation Oncology

## 2015-04-16 VITALS — BP 116/72 | HR 85

## 2015-04-16 DIAGNOSIS — C3431 Malignant neoplasm of lower lobe, right bronchus or lung: Secondary | ICD-10-CM | POA: Insufficient documentation

## 2015-04-16 DIAGNOSIS — J90 Pleural effusion, not elsewhere classified: Secondary | ICD-10-CM | POA: Diagnosis not present

## 2015-04-16 DIAGNOSIS — D3502 Benign neoplasm of left adrenal gland: Secondary | ICD-10-CM | POA: Diagnosis not present

## 2015-04-16 DIAGNOSIS — Z8673 Personal history of transient ischemic attack (TIA), and cerebral infarction without residual deficits: Secondary | ICD-10-CM | POA: Diagnosis not present

## 2015-04-16 DIAGNOSIS — J449 Chronic obstructive pulmonary disease, unspecified: Secondary | ICD-10-CM | POA: Diagnosis not present

## 2015-04-16 DIAGNOSIS — Z923 Personal history of irradiation: Secondary | ICD-10-CM | POA: Diagnosis not present

## 2015-04-16 DIAGNOSIS — I2510 Atherosclerotic heart disease of native coronary artery without angina pectoris: Secondary | ICD-10-CM | POA: Diagnosis not present

## 2015-04-16 DIAGNOSIS — J690 Pneumonitis due to inhalation of food and vomit: Secondary | ICD-10-CM | POA: Diagnosis not present

## 2015-04-16 DIAGNOSIS — I712 Thoracic aortic aneurysm, without rupture: Secondary | ICD-10-CM | POA: Insufficient documentation

## 2015-04-16 MED ORDER — AMOXICILLIN-POT CLAVULANATE 875-125 MG PO TABS: 1.0000 | ORAL_TABLET | Freq: Two times a day (BID) | ORAL | Status: DC

## 2015-04-16 NOTE — Progress Notes (Signed)
Subjective:    Patient ID: Malik Zuniga, male    DOB: [birth year], 70 y.o.   MRN: [id number]  Synopsis: former patient of Dr. [name] with COPD and recurrent aspiration pneumonia who was diagnosed with Adenocarcinoma with a transthoracic needle biopsy in 11/2014 which was treated with XRT. [name] 04/2009:  FEV1 1.65 (47%), FEV1% 49  CT chest 10/2013:  RLL infiltrates CXR 10/2013:  LLL infiltrate +h/o aspiration in the past.  +speech therapy evaluation with recs given.  He was diagnosed with Stage Ia Adenocarcinoma of the lung in 11/2014 with a needle biopsy and was treated with radiation therapy  HPI  Chief Complaint  Patient presents with  . Follow-up    pt recently hospitalized with pna at [facility].  Pt denies any current breathing complaints.     [name] was admitted to the hospital for three days last month for pneumonia.  He was treated with antibiotic therapy and was an inpatient for three days.   Yesterday he had chills, congestion, and chills.  He said this was a new problem for him.  He felt more short of breath that day as well.  He feels generally weak and has fallen three times since coming home from the hospital. He has been treated with radiation therapy for the adenocarcinoma.     Past Medical History  Diagnosis Date  . Hyperlipidemia     takes Simvastatin daily  . Tremor   . History of prostate cancer   . On home O2   . Neuromuscular disorder ([place]) 1998    right carpal tunnel release  . Anemia associated with acute blood loss   . GERD (gastroesophageal reflux disease)     takes Omeprazole daily  . Hypertension     takes Metoprolol daily as well as Hyzaar  . Constipation     takes Colace daily as well as Miralax  . Anxiety     takes Valium daily  . Depression     takes Cymbalta daily  . Emphysema of lung ([facility])     Albuterol as needed;Symbicort daily and Singulair at bedtime  . Emphysema   . Myocardial infarction ([place]) 04/1998  . Coronary artery disease   . Asthma     . Shortness of breath     with exertion  . History of bronchitis   . Aspiration pneumonia ([place]) 2010  . Headache, chronic daily   . History of migraine     last migraine a couple of days ago;takes Excedrin Migraine  . Chronic back pain     compression fracture  . History of colon polyps   . Mood change ([place])     after Brain surgery mood changed and was placed on Depakote  . Insomnia     takes Benadryl nightly  . Stroke [facility]) 1998    Brain Aneurysm  . Prostate cancer ([place]) 2004  . Lung cancer ([place])         Review of Systems  Constitutional: Negative for fever, chills and fatigue.  HENT: Negative for postnasal drip, rhinorrhea and sinus pressure.   Respiratory: Negative for cough, shortness of breath and wheezing.        Objective:   Physical Exam Filed Vitals:   04/16/15 1546  BP: 116/72  Pulse: 85  SpO2: 92%   RA  Gen: chronically ill appearing, in wheelchair HENT: OP clear, TM's clear, neck supple PULM: CTA B, normal percussion CV: RRR, no mgr, trace edema GI: BS+, soft, nontender Derm: no cyanosis  or rash Psyche: normal mood and affect  04/16/2014 CT chest > nodule (adenocarcinoma) RLL improved in size, improving left lower lobe consolidation with a small residual left-sided pleural effusion  Hospital records from last month reviewed were he was admitted for sepsis with pneumonia      Assessment & Plan:  Hx of TIA (transient ischemic attack) and stroke This has not been a recent problem for him but he has struggled with weakness and instability for quite some time. I encouraged him to consider physical therapy today but he says that he doesn't think he needs it so he refused. I encouraged him to try to eat is much as he can within careful limits to prevent aspiration to try to improve his overall strength.  COPD  GOLD III with reversibility  This is been a stable interval for him despite the recent episode of pneumonia he has not had flares of his  COPD.  Plan: Continue his somewhat unconventional regimen with budesonide twice a day and nebulized DuoNeb 3 times a day  Recurrent aspiration pneumonia ([place]) He had a left lower lobe infiltrate on chest CT in December associated with sepsis. Today's CT chest shows improving consolidation in the left lower lobe and a very small pleural effusion as well as improved findings with his adenocarcinoma. However, he continued to have some fever yesterday with cough. It's reasonable to give another course of treatment considering his high risk of recurrent aspiration pneumonia.  Notably, his speech therapy evaluation while hospitalized recently did not show significant evidence of aspiration but I still consider this to be a problem for him.  Plan: Augmentin 5 days Mucinex to help reduce mucus Follow-up chest x-ray in 6-8 weeks with a clinic visit with [place]  Primary cancer of right lower lobe of lung ([place]) Today's CT chest showed that adenocarcinoma has decreased in size.     Current outpatient prescriptions:  .  ARIPiprazole (ABILIFY) 10 MG tablet, Take 5 mg by mouth daily. , Disp: , Rfl:  .  Ascorbic Acid (VITAMIN C) 1000 MG tablet, Take 1,000 mg by mouth daily., Disp: , Rfl:  .  aspirin-acetaminophen-caffeine (EXCEDRIN MIGRAINE) 250-250-65 MG per tablet, Take 2 tablets by mouth every 6 (six) hours as needed. For headaches, Disp: , Rfl:  .  budesonide (PULMICORT) 0.25 MG/2ML nebulizer solution, Take 4 mLs (0.5 mg total) by nebulization 2 (two) times daily., Disp: 60 mL, Rfl: 3 .  cetirizine (ZYRTEC) 10 MG tablet, Take 1 tablet (10 mg total) by mouth daily., Disp: 30 tablet, Rfl: 11 .  cholecalciferol (VITAMIN D) 1000 UNITS tablet, Take 1,000 Units by mouth every morning. , Disp: , Rfl:  .  diazepam (VALIUM) 5 MG tablet, Take 5 mg by mouth 3 (three) times daily. scheduled, Disp: , Rfl:  .  divalproex (DEPAKOTE) 500 MG 24 hr tablet, Take 1,000 mg by mouth at bedtime. , Disp: , Rfl:  .  docusate  sodium (COLACE) 100 MG capsule, Take 200 mg by mouth at bedtime. , Disp: , Rfl:  .  DULoxetine (CYMBALTA) 60 MG capsule, Take 120 mg by mouth daily. , Disp: , Rfl:  .  fluticasone (FLONASE) 50 MCG/ACT nasal spray, PLACE 2 SPRAYS INTO BOTH NOSTRILS DAILY. (Patient taking differently: PLACE 2 SPRAYS INTO BOTH NOSTRILS DAILY as needed for allergies), Disp: 16 g, Rfl: 2 .  HYDROcodone-acetaminophen (NORCO) 10-325 MG per tablet, Take 1 tablet by mouth every 6 (six) hours as needed for moderate pain. for pain, Disp: , Rfl: 0 .  ipratropium-albuterol (DUONEB) 0.5-2.5 (3) MG/3ML SOLN, Take 3 mLs by nebulization every 6 (six) hours., Disp: 360 mL, Rfl: 3 .  losartan-hydrochlorothiazide (HYZAAR) 50-12.5 MG tablet, TAKE 1 TABLET BY MOUTH DAILY., Disp: 30 tablet, Rfl: 1 .  metoprolol succinate (TOPROL-XL) 50 MG 24 hr tablet, TAKE 1 TABLET (50 MG TOTAL) BY MOUTH EVERY MORNING. TAKE WITH OR IMMEDIATELY FOLLOWING A MEAL., Disp: 30 tablet, Rfl: 6 .  montelukast (SINGULAIR) 10 MG tablet, TAKE 1 TABLET BY MOUTH AT BEDTIME, Disp: 30 tablet, Rfl: 6 .  NITROSTAT 0.4 MG SL tablet, PLACE 1 TABLET (0.4 MG TOTAL) UNDER THE TONGUE EVERY 5 (FIVE) MINUTES AS NEEDED FOR CHEST PAIN., Disp: 25 tablet, Rfl: 1 .  omeprazole (PRILOSEC) 40 MG capsule, Take 40 mg by mouth 2 (two) times daily. , Disp: , Rfl:  .  polyethylene glycol (MIRALAX / GLYCOLAX) packet, Take 17 g by mouth 3 (three) times daily as needed for moderate constipation. For constipation, Disp: , Rfl:  .  primidone (MYSOLINE) 250 MG tablet, TAKE 1 TABLET (250 MG TOTAL) BY MOUTH 2 (TWO) TIMES DAILY., Disp: 60 tablet, Rfl: 6 .  promethazine (PHENERGAN) 25 MG tablet, Take 25 mg by mouth every 6 (six) hours as needed. Reported on 04/06/2015, Disp: , Rfl: 1 .  simvastatin (ZOCOR) 40 MG tablet, TAKE 1 TABLET (40 MG TOTAL) BY MOUTH EVERY EVENING., Disp: 90 tablet, Rfl: 1 .  tiZANidine (ZANAFLEX) 4 MG tablet, TAKE 1 TABLET BY MOUTH EVERY 8 HOURS AS NEEDED FOR MUSCLE SPASMS., Disp:  90 tablet, Rfl: 1 .  Zinc 50 MG TABS, Take 50 mg by mouth daily. , Disp: , Rfl:  .  amoxicillin-clavulanate (AUGMENTIN) 875-125 MG tablet, Take 1 tablet by mouth 2 (two) times daily., Disp: 10 tablet, Rfl: 0

## 2015-04-16 NOTE — Assessment & Plan Note (Signed)
Today's CT chest showed that adenocarcinoma has decreased in size.

## 2015-04-16 NOTE — Assessment & Plan Note (Signed)
He had a left lower lobe infiltrate on chest CT in December associated with sepsis. Today's CT chest shows improving consolidation in the left lower lobe and a very small pleural effusion as well as improved findings with his adenocarcinoma. However, he continued to have some fever yesterday with cough. It's reasonable to give another course of treatment considering his high risk of recurrent aspiration pneumonia.  Notably, his speech therapy evaluation while hospitalized recently did not show significant evidence of aspiration but I still consider this to be a problem for him.  Plan: Augmentin 5 days Mucinex to help reduce mucus Follow-up chest x-ray in 6-8 weeks with a clinic visit with [place]

## 2015-04-16 NOTE — Assessment & Plan Note (Signed)
This is been a stable interval for him despite the recent episode of pneumonia he has not had flares of his COPD.  Plan: Continue his somewhat unconventional regimen with budesonide twice a day and nebulized DuoNeb 3 times a day

## 2015-04-16 NOTE — Assessment & Plan Note (Signed)
This has not been a recent problem for him but he has struggled with weakness and instability for quite some time. I encouraged him to consider physical therapy today but he says that he doesn't think he needs it so he refused. I encouraged him to try to eat is much as he can within careful limits to prevent aspiration to try to improve his overall strength.

## 2015-04-16 NOTE — Patient Instructions (Signed)
We will have you follow-up with our nurse practitioner in 6 weeks to ensure that you are continuing to improve I recommend that you gradually increase exercise and consider working with physical therapy Take the Augmentin for 5 days as prescribed

## 2015-04-18 ENCOUNTER — Other Ambulatory Visit: Payer: Self-pay | Admitting: *Deleted

## 2015-04-18 NOTE — Patient Outreach (Signed)
Weekly transition of care call placed to member, wife answers the phone.  She state that he is still weak but doing a little better overall.  She report that he was seen by the pulmonology physician on Monday and restarted on antibiotics due to fluid in his lungs and concern for ongoing pneumonia.  She state that he has another appointment tomorrow with the cancer specialist to assess how he is progressing.  She denies any urgent concerns, stating "I just want them to say that his pneumonia is all gone."  Reassurance provided, encouraged to continue plan of care.  Encouraged to contact this care manager with questions.  Initial home visit confirmed for Monday.  Valente Josephine, BSN, [place] [facility]  [facility] Care Manager [phone]

## 2015-04-19 ENCOUNTER — Ambulatory Visit
Admission: RE | Admit: 2015-04-19 | Discharge: 2015-04-19 | Disposition: A | Discharge status: Home / Self Care | Payer: Medicare Other | Source: Ambulatory Visit | Attending: Radiation Oncology | Admitting: Radiation Oncology

## 2015-04-19 ENCOUNTER — Encounter: Payer: Self-pay | Admitting: *Deleted

## 2015-04-19 VITALS — BP 110/67 | HR 80 | Resp 16 | Wt 160.9 lb

## 2015-04-19 DIAGNOSIS — I2510 Atherosclerotic heart disease of native coronary artery without angina pectoris: Secondary | ICD-10-CM

## 2015-04-19 DIAGNOSIS — C3431 Malignant neoplasm of lower lobe, right bronchus or lung: Secondary | ICD-10-CM

## 2015-04-19 DIAGNOSIS — I2584 Coronary atherosclerosis due to calcified coronary lesion: Secondary | ICD-10-CM

## 2015-04-19 DIAGNOSIS — E46 Unspecified protein-calorie malnutrition: Secondary | ICD-10-CM

## 2015-04-19 NOTE — Progress Notes (Addendum)
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    ICD-9-CM ICD-10-CM   1. Cancer of lower lobe of right lung (HCC) 162.5 C34.31 CT Chest Wo Contrast    Interval Since Last Radiation:  3  months ; 01/22/2015  Narrative:  The patient returns today for routine follow-up. Apparently the patient was hospitalized February 12 of 04/04/2015 with pneumonia resulted in sepsis. The patient has been on high-dose antibiotic therapy and is currently on Augmentin. He was started on this as a CT scan dated reveals some persistent effusion, and he was encouraged to also use Mucinex. His CT on 04/16/2015 revealed improvement of the right lower lobe lesion previously measuring 8 x 14 mm, and currently measuring 6 x 12 mm. Interestingly it appears that this coronary vessels have calcifications, and a small effusion was still present within the left lower lobe.  On review of systems, the patient reports that he is doing okay and times of getting back to eating and is trying to increase his oral intake and calorie count. He states that he has been drinking in dextrose Ensure per day seems to do well with this. He denies any abdominal pain or nausea. He is not experiencing any shortness of breath or chest pain. He is not experiencing any cough, and reports that he continues to have fatigue and sleeps multiple hours during the day. He does have drains and chronic headaches every night since his accident many years ago. He has had some episodes of dizziness since his hospitalization and has the ability to check his blood pressure. He has not done so however in the clinic today his  blood pressure is 110/67. He denies any auditory disturbances or tinnitus. He is not experiencing blurred vision or double vision. No seizure activity as reported. He does experience chronic back pain as well. Complete review of systems is obtained and is otherwise negative.                         ALLERGIES:  is allergic to lisinopril; lamictal; and ambien.  Meds: Current Outpatient Prescriptions  Medication Sig Dispense Refill  . amoxicillin-clavulanate (AUGMENTIN) 875-125 MG tablet Take 1 tablet by mouth 2 (two) times daily. 10 tablet 0  . ARIPiprazole (ABILIFY) 10 MG tablet Take 5 mg by mouth daily.     . Ascorbic Acid (VITAMIN C) 1000 MG tablet Take 1,000 mg by mouth daily.    Marland Kitchen aspirin-acetaminophen-caffeine (EXCEDRIN MIGRAINE) 250-250-65 MG per tablet Take 2 tablets by mouth every 6 (six) hours as needed. For headaches    . budesonide (PULMICORT) 0.25 MG/2ML nebulizer solution Take 4 mLs (0.5 mg total) by nebulization 2 (two) times daily. 60 mL 3  . cetirizine (ZYRTEC) 10 MG tablet Take 1 tablet (10 mg total) by mouth daily. 30 tablet 11  . cholecalciferol (VITAMIN D) 1000 UNITS tablet Take 1,000 Units by mouth every morning.     Marland Kitchen dextromethorphan-guaiFENesin (MUCINEX DM) 30-600 MG 12hr tablet Take 1 tablet by mouth 2 (two) times daily.    . diazepam (VALIUM) 5 MG tablet Take 5 mg by  mouth 3 (three) times daily. scheduled    . divalproex (DEPAKOTE) 500 MG 24 hr tablet Take 1,000 mg by mouth at bedtime.     . docusate sodium (COLACE) 100 MG capsule Take 200 mg by mouth at bedtime.     . DULoxetine (CYMBALTA) 60 MG capsule Take 120 mg by mouth daily.     . fluticasone (FLONASE) 50 MCG/ACT nasal spray PLACE 2 SPRAYS INTO BOTH NOSTRILS DAILY. (Patient taking differently: PLACE 2 SPRAYS INTO BOTH NOSTRILS DAILY as needed for allergies) 16 g 2  . HYDROcodone-acetaminophen (NORCO) 10-325 MG per tablet Take 1 tablet by mouth every 6 (six) hours as needed for moderate pain. for pain  0  .  ipratropium-albuterol (DUONEB) 0.5-2.5 (3) MG/3ML SOLN Take 3 mLs by nebulization every 6 (six) hours. 360 mL 3  . losartan-hydrochlorothiazide (HYZAAR) 50-12.5 MG tablet TAKE 1 TABLET BY MOUTH DAILY. 30 tablet 1  . metoprolol succinate (TOPROL-XL) 50 MG 24 hr tablet TAKE 1 TABLET (50 MG TOTAL) BY MOUTH EVERY MORNING. TAKE WITH OR IMMEDIATELY FOLLOWING A MEAL. 30 tablet 6  . montelukast (SINGULAIR) 10 MG tablet TAKE 1 TABLET BY MOUTH AT BEDTIME 30 tablet 6  . NITROSTAT 0.4 MG SL tablet PLACE 1 TABLET (0.4 MG TOTAL) UNDER THE TONGUE EVERY 5 (FIVE) MINUTES AS NEEDED FOR CHEST PAIN. 25 tablet 1  . omeprazole (PRILOSEC) 40 MG capsule Take 40 mg by mouth 2 (two) times daily.     . polyethylene glycol (MIRALAX / GLYCOLAX) packet Take 17 g by mouth 3 (three) times daily as needed for moderate constipation. For constipation    . primidone (MYSOLINE) 250 MG tablet TAKE 1 TABLET (250 MG TOTAL) BY MOUTH 2 (TWO) TIMES DAILY. 60 tablet 6  . promethazine (PHENERGAN) 25 MG tablet Take 25 mg by mouth every 6 (six) hours as needed. Reported on 04/06/2015  1  . simvastatin (ZOCOR) 40 MG tablet TAKE 1 TABLET (40 MG TOTAL) BY MOUTH EVERY EVENING. 90 tablet 1  . tiZANidine (ZANAFLEX) 4 MG tablet TAKE 1 TABLET BY MOUTH EVERY 8 HOURS AS NEEDED FOR MUSCLE SPASMS. 90 tablet 1  . Zinc 50 MG TABS Take 50 mg by mouth daily.      No current facility-administered medications for this encounter.    Physical Findings:  weight is 160 lb 14.4 oz (72.984 kg). His blood pressure is 110/67 and his pulse is 80. His respiration is 16 and oxygen saturation is 93%.   Pain scale 2 out of 10, chronic back pain In general this is a well appearing Caucasian male in no acute distress. He is seated in a wheelchair, and is alert and oriented x4 and appropriate throughout the examination. HEENT reveals that the patient is normocephalic, atraumatic. EOMs are intact. PERRLA. Skin is intact without any evidence of gross lesions. Cardiovascular exam  reveals a regular rate and rhythm, no clicks rubs or murmurs are auscultated. Chest is clear to auscultation bilaterally. Lymphatic assessment is performed and does not reveal any adenopathy in the cervical, supraclavicular, axillary, or inguinal chains. Abdomen has active bowel sounds in all quadrants and is intact. The abdomen is soft, non tender, non distended. Lower extremities are negative for pretibial pitting edema, deep calf tenderness, cyanosis or clubbing.    Lab Findings: Lab Results  Component Value Date   WBC 5.0 04/10/2015   HGB 12.2* 04/10/2015   HCT 36.9* 04/10/2015   PLT 660.0* 04/10/2015   PLT 329 12/10/2009    Lab Results  Component Value  Date   NA 131* 04/10/2015   K 5.0 04/10/2015   CO2 25 04/10/2015   GLUCOSE 95 04/10/2015   GLUCOSE 88 12/10/2009   BUN 21 04/10/2015   CREATININE 0.82 04/10/2015   CREATININE 0.65 10/11/2013   BILITOT 0.2 04/10/2015   ALKPHOS 55 04/10/2015   AST 21 04/10/2015   ALT 19 04/10/2015   PROT 7.5 04/10/2015   ALBUMIN 3.8 04/10/2015   CALCIUM 9.8 04/10/2015   ANIONGAP 12 04/04/2015    Radiographic Findings: Ct Chest Wo Contrast  04/16/2015  CLINICAL DATA:  Right lung cancer treated with radiotherapy. Weight loss. EXAM: CT CHEST WITHOUT CONTRAST TECHNIQUE: Multidetector CT imaging of the chest was performed following the standard protocol without IV contrast. COMPARISON:  04/01/2015, PET 11/16/2014 and CT chest 11/08/2014. FINDINGS: Mediastinum/Nodes: Mediastinal lymph nodes are not enlarged by CT size criteria. Hilar regions are difficult to definitively evaluate without IV contrast. No axillary adenopathy. Ascending aorta measures at the upper limits of normal, 4 cm. Three-vessel coronary artery calcification. Heart size normal. No pericardial effusion. Lungs/Pleura: Mild to moderate centrilobular emphysema. There is patchy ground-glass and mild architectural distortion in the periphery of the superior segment right lower lobe,  surrounding a subpleural nodule which measures approximately 6 x 12 mm (5/35), previously 8 x 14 mm on 11/08/2014. Small area of residual consolidation is seen in the medial left lower lobe, with an adjacent small left pleural effusion, considerably improved from 04/01/2015. Airway is unremarkable. Upper abdomen: Low-attenuation lesions in the liver measure up to 1.5 cm, likely cysts although definitive characterization is limited due to size and lack of postcontrast imaging. Cholecystectomy. Visualized portions of the right adrenal gland is unremarkable. Left adrenal nodule is fluid in density and measures 11 mm, stable. Visualized portion of the kidneys, spleen, pancreas, stomach and bowel are grossly unremarkable. Musculoskeletal: Postoperative changes of thoracolumbar fusion. No worrisome lytic or sclerotic lesions. Compression deformity above the level of the fusion is unchanged. IMPRESSION: 1. Decrease in size of a right lower lobe adenocarcinoma with changes of radiation therapy in the adjacent right lung. 2. Improving left lower lobe consolidation with a residual small left pleural effusion. 3. Borderline ascending aortic aneurysm. 4. Three-vessel coronary artery calcification. 5. Left adrenal adenoma. Electronically Signed   By: Lorin Picket M.D.   On: 04/16/2015 11:38   Ct Angio Chest Pe W/cm &/or Wo Cm  04/01/2015  CLINICAL DATA:  Shortness of breath. Weakness. History of hypertension and emphysema. History of prostate cancer and lung cancer. EXAM: CT ANGIOGRAPHY CHEST WITH CONTRAST TECHNIQUE: Multidetector CT imaging of the chest was performed using the standard protocol during bolus administration of intravenous contrast. Multiplanar CT image reconstructions and MIPs were obtained to evaluate the vascular anatomy. CONTRAST:  145m OMNIPAQUE IOHEXOL 350 MG/ML SOLN COMPARISON:  Chest x-ray 04/01/2015, CT 12/01/2014 and 11/08/2014 FINDINGS: Heart: Coronary artery calcifications are present. Heart  size is normal. Vascular structures: Pulmonary arteries are well opacified. There is no evidence for acute pulmonary embolus. There is atherosclerotic calcification of the thoracic aorta. No aneurysm or evidence for dissection. Mediastinum/thyroid: The visualized portion of the thyroid gland has a normal appearance. Small lymph nodes are identified in the AP window region, measuring up to 1.0 cm. Precarinal lymph node is 1.7 cm. Left hilar lymph nodes are present and are asymmetric compared to those on the right, possibly secondary to left lower lobe process. Lungs/Airways: There is a left pleural effusion. Oval masslike density is identified at the left lung base measuring 2.7 x  4.5 cm. Spur present kidney finding. Left basilar atelectasis is present. Emphysematous changes are noted. There is an irregularly-shaped nodule in posterior aspect of the right lower lobe, measuring 0.9 x 1.0 cm on image 46 of series 5. Nodule appears slightly smaller compared prior study but is adjacent to atelectasis and small effusion possibly accounting for the changes. Upper abdomen: Status post cholecystectomy. Chest wall/osseous structures: Scoliosis with spinal fixation rods. No suspicious lytic or blastic lesions are identified. There is fusion of T8-9. Hardware has been removed from this fused level. Review of the MIP images confirms the above findings. IMPRESSION: 1. Persistent nodule within the posterior aspect of the right lower lobe measuring 1.0 cm maximum diameter. Pre biopsy the lesion measured 12 mm. The apparent change may be related to interval biopsy and/or mass adjacent to the atelectasis and small effusion today. 2. Left lower lobe oval density measuring 4.5 cm. Although somewhat masslike in configuration, findings are favored to be related to infectious process. Follow-up is recommended to exclude malignancy. There are associated small mediastinal and left hilar lymph nodes, most likely reactive. Electronically  Signed   By: Nolon Nations M.D.   On: 04/01/2015 17:06   Dg Chest Port 1 View  04/01/2015  CLINICAL DATA:  Patient brought to ed by wife after collapsing today, patient presents with swelling in upper extremities and weakness. Patient recently finished radiation treatment for lung cancer in Lower left lobe EXAM: PORTABLE CHEST 1 VIEW COMPARISON:  Chest radiograph, 01/31/2015.  Chest CT:  11/08/2014. FINDINGS: Right lower lobe lung carcinoma is not evident on the current exam. It may be partly obscured by overlying wires. Lungs are clear.  No pleural effusion or pneumothorax. Heart, mediastinum and hila are unremarkable. IMPRESSION: No acute cardiopulmonary disease. Electronically Signed   By: Lajean Manes M.D.   On: 04/01/2015 15:19    Impression/Plan: 1. NSCLC of the RLL S/P SBRT. We discussed the findings on CT imaging, and at this point in time it appears that the previously treated area is decreased in size. We will continue to follow this closely and recommend repeat imaging in approximately 6 months time. He states agreement and understanding and is encouraged to call prior to his next visit if he has questions or concerns, or new onset of symptoms. 2.  Pneumonia resulting in recent sepsis. He is been advised to use Mucinex DM and initial course of Augmentin by his pulmonologist. We also recommended that he consider using an incentive spirometer which she has at home to try and encourage deep breathing with associated expelling some of this mucous as well. 3.  Coronary Artery Disease. The patient will follow up with cardiology given his history of stent placement and the mention of coronary vessel calcifications on imaging studies. He continues his blood pressure management with cardiology as well as cholesterol management. 4. Protein calorie malnutrition. The patient's trying to increase his oral intake, and will continue using Ensure and protein shakes. We have encouraged him in his endeavors,  and will continue to follow this expectantly. 5. New dizziness. This could be related to his lower blood pressure today in comparison to his previous. He will follow-up with his blood pressure at home and check this morning and in the afternoon. This is persistently in the current range, he is advised to contact cardiology to discuss potential changes to his blood pressure regimen. If his symptoms otherwise seemed to progress, we would recommend further workup. Patient will keep [place] informed of his status.  Carola Rhine, PAC    This document serves as a record of services personally performed by Shona Simpson, [facility] It was created on her behalf by [name], a trained medical scribe. The creation of this record is based on the scribe's personal observations and the provider's statements to them. This document has been checked and approved by the attending provider.

## 2015-04-19 NOTE — Progress Notes (Signed)
Weight stable. Reports poor appetite but, denies nausea or taste changes. Denies cough. Denies SOB. Reports "if he is cautious" foods without pain or difficulty. Taking augmentin to manage pneumonia. Discharged from the hospital 04/09/2015 after a bout with pneumonia and sepsis. Reports weakness and fatigue. Reports he sleeps more than he is awake. Reports vivid crazy dreams. Reports chronic headaches every night. Reports new onset of dizziness. Reports ringing in his ear continues since accident. Denies diplopia or floater.   BP 110/67 mmHg  Pulse 80  Resp 16  Wt 160 lb 14.4 oz (72.984 kg)  SpO2 93% Wt Readings from Last 3 Encounters:  04/19/15 160 lb 14.4 oz (72.984 kg)  04/02/15 158 lb 11.2 oz (71.986 kg)  01/31/15 160 lb (72.576 kg)

## 2015-04-20 ENCOUNTER — Telehealth: Payer: Self-pay | Admitting: *Deleted

## 2015-04-20 NOTE — Telephone Encounter (Signed)
CALLED PATIENT TO INFORM OF CT SCAN ON 10-16-15 - ARRIVAL TIME - 2:15 PM @ [facility], AND HIS FU WITH DR. MANNING ON 10-18-15 @ 3 PM, SPOKE WITH PATIENT AND HE IS AWARE OF THESE APPTS.

## 2015-04-23 ENCOUNTER — Ambulatory Visit: Payer: Self-pay | Admitting: *Deleted

## 2015-04-23 ENCOUNTER — Other Ambulatory Visit: Payer: Self-pay | Admitting: *Deleted

## 2015-04-23 NOTE — Patient Outreach (Signed)
Missed call/voice message received from [facility] wife, [name], requesting a call back.  Call placed back to Mrs. Lazarz, she state that the member is requesting to postpone today's home visit.  She reports that he is not feeling well, and does not feel up to company.  She denies the need for medical attention, stating he just want to rest.  Home visit rescheduled for Thursday.  Valente Brydon, BSN, [facility]  [facility] Care Manager [phone]

## 2015-04-24 ENCOUNTER — Other Ambulatory Visit: Payer: Self-pay | Admitting: Interventional Cardiology

## 2015-04-25 ENCOUNTER — Telehealth: Payer: Self-pay | Admitting: Pulmonary Disease

## 2015-04-25 MED ORDER — AZITHROMYCIN 250 MG PO TABS: ORAL_TABLET | ORAL | Status: DC

## 2015-04-25 MED ORDER — PREDNISONE 10 MG PO TABS: ORAL_TABLET | ORAL | Status: DC

## 2015-04-25 NOTE — Telephone Encounter (Signed)
Last ov on 04/16/15 with BQ Patient Instructions     We will have you follow-up with our nurse practitioner in 6 weeks to ensure that you are continuing to improve I recommend that you gradually increase exercise and consider working with physical therapy Take the Augmentin for 5 days as prescribed   Called and spoke with pt's wife. Pt's wife states the pt c/o any SOB, wheezing, sinus drainage, chest tightness, slight dry cough. Denies any chest congestion, sinus congestion, fever, nausea or vomiting. Pt is requesting something be called into the [facility] on [address].  I explained that I would send a message to VS for his recs. Pt's wife voiced understanding and had no further questions.   VS please advise

## 2015-04-25 NOTE — Telephone Encounter (Signed)
Called and spoke with pt's wife. Reviewed recs and verified pharmacy as [facility]. She voiced understanding and had no further questions. Rx sent. Nothing further needed

## 2015-04-25 NOTE — Telephone Encounter (Signed)
Send script for Zpak.  Send script for prednisone 10 mg > 3 pills daily for 2 days, 2 pills daily for 2 days, 1 pill daily for 2 days.  Dispense 12 with no refills.  He needs ROV if not improving.

## 2015-04-26 ENCOUNTER — Encounter: Payer: Self-pay | Admitting: *Deleted

## 2015-04-26 ENCOUNTER — Other Ambulatory Visit: Payer: Self-pay | Admitting: *Deleted
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
## 2015-04-30 ENCOUNTER — Telehealth: Payer: Self-pay

## 2015-04-30 ENCOUNTER — Other Ambulatory Visit: Payer: Medicare Other

## 2015-04-30 ENCOUNTER — Telehealth: Payer: Self-pay | Admitting: Pulmonary Disease

## 2015-04-30 NOTE — Telephone Encounter (Signed)
Spoke with pt, states that he finished augmentin from last ov with BQ, but still not feeling better- c/o fatigue, sob, weakness, just "feels bad all the time".   Denies cough, mucus production, fever, chills.  Pt requesting a refill on augmentin and any further recs BQ may have.    Pt uses [facility] [address]    BQ please advise.  Thanks.

## 2015-04-30 NOTE — Telephone Encounter (Signed)
Without cough, mucus production or chills I don't think that there is a role for antibiotics.  If he still feels bad he needs an office visit.

## 2015-04-30 NOTE — Telephone Encounter (Signed)
Called pt wife and advised that they could go to [place] to have labs drawn. Lab appt for today was cancelled.

## 2015-04-30 NOTE — Telephone Encounter (Signed)
Wife called stated patient is not feel well and would like to know can they have labs at [place] location because it is closer to their home. Please advise

## 2015-04-30 NOTE — Telephone Encounter (Signed)
Spoke with [name] and notified of recs per BQ  She verbalized understanding  She states unsure if pt will want to come in for appt, but will let him know BQ's response and he will call for appt if he wants to come in  Nothing further needed

## 2015-05-01 ENCOUNTER — Ambulatory Visit: Payer: Medicare Other | Admitting: Cardiology

## 2015-05-02 ENCOUNTER — Encounter: Payer: Self-pay | Admitting: Adult Health

## 2015-05-02 ENCOUNTER — Ambulatory Visit (INDEPENDENT_AMBULATORY_CARE_PROVIDER_SITE_OTHER): Payer: Medicare Other | Admitting: Adult Health

## 2015-05-02 ENCOUNTER — Other Ambulatory Visit (INDEPENDENT_AMBULATORY_CARE_PROVIDER_SITE_OTHER): Payer: Medicare Other

## 2015-05-02 ENCOUNTER — Other Ambulatory Visit: Payer: Self-pay | Admitting: Family Medicine

## 2015-05-02 VITALS — BP 130/90 | HR 94 | Temp 97.6°F | Ht 70.0 in | Wt 160.0 lb

## 2015-05-02 DIAGNOSIS — I2510 Atherosclerotic heart disease of native coronary artery without angina pectoris: Secondary | ICD-10-CM | POA: Diagnosis not present

## 2015-05-02 DIAGNOSIS — E875 Hyperkalemia: Secondary | ICD-10-CM | POA: Diagnosis not present

## 2015-05-02 DIAGNOSIS — D691 Qualitative platelet defects: Secondary | ICD-10-CM

## 2015-05-02 DIAGNOSIS — I2584 Coronary atherosclerosis due to calcified coronary lesion: Secondary | ICD-10-CM

## 2015-05-02 DIAGNOSIS — J449 Chronic obstructive pulmonary disease, unspecified: Secondary | ICD-10-CM | POA: Diagnosis not present

## 2015-05-02 LAB — BASIC METABOLIC PANEL
BUN: 18 mg/dL (ref 6–23)
CALCIUM: 9.7 mg/dL (ref 8.4–10.5)
CO2: 31 mEq/L (ref 19–32)
CREATININE: 0.86 mg/dL (ref 0.40–1.50)
Chloride: 93 mEq/L — ABNORMAL LOW (ref 96–112)
GFR: 93.48 mL/min (ref >60.00)
Glucose, Bld: 93 mg/dL (ref 70–99)
Potassium: 5.2 mEq/L — ABNORMAL HIGH (ref 3.5–5.1)
Sodium: 130 mEq/L — ABNORMAL LOW (ref 135–145)

## 2015-05-02 LAB — CBC WITH DIFFERENTIAL/PLATELET
BASOS ABS: 0 10*3/uL (ref 0.0–0.1)
Basophils Relative: 0.1 % (ref 0.0–3.0)
EOS ABS: 0.2 10*3/uL (ref 0.0–0.7)
Eosinophils Relative: 3 % (ref 0.0–5.0)
HEMATOCRIT: 37.2 % — AB (ref 39.0–52.0)
HEMOGLOBIN: 12.4 g/dL — AB (ref 13.0–17.0)
LYMPHS PCT: 42.1 % (ref 12.0–46.0)
Lymphs Abs: 2.3 10*3/uL (ref 0.7–4.0)
MCHC: 33.3 g/dL (ref 30.0–36.0)
MCV: 87.2 fl (ref 78.0–100.0)
Monocytes Absolute: 0.5 10*3/uL (ref 0.1–1.0)
Monocytes Relative: 9 % (ref 3.0–12.0)
Neutro Abs: 2.5 10*3/uL (ref 1.4–7.7)
Neutrophils Relative %: 45.8 % (ref 43.0–77.0)
PLATELETS: 358 10*3/uL (ref 150.0–400.0)
RBC: 4.27 Mil/uL (ref 4.22–5.81)
RDW: 16.5 % — ABNORMAL HIGH (ref 11.5–15.5)
WBC: 5.4 10*3/uL (ref 4.0–10.5)

## 2015-05-02 MED ORDER — PREDNISONE 10 MG PO TABS: ORAL_TABLET | ORAL | Status: DC

## 2015-05-02 NOTE — Assessment & Plan Note (Signed)
Slow to resolve exacerbation with associated resolving PNA  undelrying lung cancer s/p xrt  Recent CT cehst with improving area of lung cancer in RLL and LLL PNA>   Plan  Begin Prednisone taper -hold at '10mg'$  daily  Mucinex DM Twice daily  As needed  Cough/congestion  Continue on Pulmicort Neb Twice daily   Continue on Duoneb Four times a day   Continue on Oxygen At bedtime   Follow up .Dr. [name] in 6 weeks Call back if you change your mind regarding Physical therapy.  Please contact office for sooner follow up if symptoms do not improve or worsen or seek emergency care

## 2015-05-02 NOTE — Progress Notes (Signed)
Reviewed, agree with this plan of care 

## 2015-05-02 NOTE — Patient Instructions (Addendum)
Begin Prednisone taper -hold at '10mg'$  daily  Mucinex DM Twice daily  As needed  Cough/congestion  Continue on Pulmicort Neb Twice daily   Continue on Duoneb Four times a day   Continue on Oxygen At bedtime   Follow up .Dr. [name] in 6 weeks Call back if you change your mind regarding Physical therapy.  Please contact office for sooner follow up if symptoms do not improve or worsen or seek emergency care

## 2015-05-02 NOTE — Progress Notes (Signed)
Subjective:    Patient ID: Malik Zuniga, male    DOB: 06-03-1945 .   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 Consequently PET/CT was done on 11/16/14. This showed that the RLL nodule measured 1.3 cm with an SUV max equal to 3.5. CT biopsy was performed on 12/01/14 revealing adenocarcinoma.  PFT on 11/13/2014 showed a post bronchodilator FEV1 at 45%, ratio 45, FVC 74%, positive bronchodilator response.    History of Present Illness  05/02/2015 Acute OV  Pt presents for an acute office visit.  Complains of persistent chest tightness/congestion, sinus pressure/drainage, nausea, SOB and wheezing. Denies any cough, fever or vomiting.  Has taken Zapck and Augmentin and prednisone.  Feels like the augmentin did not help at all . Feels zpack and prednisone helped some but worse after stopping.  Refuses chest xray today.  He was recommended for PT but declined, discussed again today but he refuses.  CT chest done earlier this month with improvement in RLL cancer . And LLL PNA>  He denies hemoptysis , chest pain, orthopnea or edema.     Past Medical History  Diagnosis Date  . Hyperlipidemia     takes Simvastatin daily  . Tremor   . History of prostate cancer   . On home O2   . Neuromuscular disorder ([place]) 1998    right carpal tunnel release  . Anemia associated with acute blood loss   . GERD (gastroesophageal reflux disease)     takes Omeprazole daily  . Hypertension     takes Metoprolol daily as well as Hyzaar  . Constipation     takes Colace daily as well as  Miralax  . Anxiety     takes Valium daily  . Depression     takes Cymbalta daily  . Emphysema of lung ([facility])     Albuterol as needed;Symbicort daily and Singulair at bedtime  . Emphysema   . Myocardial infarction ([place]) 04/1998  . Coronary artery disease   . Asthma   . Shortness of breath     with exertion  . History of bronchitis   . Aspiration pneumonia ([place]) 2010  . Headache, chronic daily   . History of migraine     last migraine a couple of days ago;takes Excedrin Migraine  . Chronic back pain     compression fracture  . History of colon polyps   . Mood change ([place])     after Brain surgery mood changed and was placed on Depakote  . Insomnia     takes Benadryl nightly  . Stroke [facility]) 1998    Brain Aneurysm  . Prostate cancer ([place]) 2004  . Lung cancer [facility])    Current Outpatient Prescriptions on File Prior to Visit  Medication Sig Dispense Refill  . ARIPiprazole (ABILIFY) 10 MG tablet Take 5 mg by mouth daily.     . Ascorbic Acid (VITAMIN C) 1000 MG tablet Take 1,000 mg by mouth daily.    Marland Kitchen aspirin-acetaminophen-caffeine (EXCEDRIN MIGRAINE) 250-250-65 MG per tablet Take 2 tablets by mouth every 6 (six) hours as needed.  For headaches    . budesonide (PULMICORT) 0.25 MG/2ML nebulizer solution Take 4 mLs (0.5 mg total) by nebulization 2 (two) times daily. 60 mL 3  . cetirizine (ZYRTEC) 10 MG tablet Take 1 tablet (10 mg total) by mouth daily. 30 tablet 11  . cholecalciferol (VITAMIN D) 1000 UNITS tablet Take 1,000 Units by mouth every morning.     Marland Kitchen dextromethorphan-guaiFENesin (MUCINEX DM) 30-600 MG 12hr tablet Take 1 tablet by mouth 2 (two) times daily.    . diazepam (VALIUM) 5 MG tablet Take 5 mg by mouth 3 (three) times daily. scheduled    . divalproex (DEPAKOTE) 500 MG 24 hr tablet Take 1,000 mg by mouth at bedtime.     . docusate sodium (COLACE) 100 MG capsule Take 200 mg by mouth at bedtime.     . DULoxetine (CYMBALTA) 60 MG capsule Take 120 mg by mouth daily.     .  fluticasone (FLONASE) 50 MCG/ACT nasal spray PLACE 2 SPRAYS INTO BOTH NOSTRILS DAILY. (Patient taking differently: PLACE 2 SPRAYS INTO BOTH NOSTRILS DAILY as needed for allergies) 16 g 2  . HYDROcodone-acetaminophen (NORCO) 10-325 MG per tablet Take 1 tablet by mouth every 6 (six) hours as needed for moderate pain. for pain  0  . ipratropium-albuterol (DUONEB) 0.5-2.5 (3) MG/3ML SOLN Take 3 mLs by nebulization every 6 (six) hours. 360 mL 3  . losartan-hydrochlorothiazide (HYZAAR) 50-12.5 MG tablet TAKE 1 TABLET BY MOUTH DAILY. 30 tablet 3  . metoprolol succinate (TOPROL-XL) 50 MG 24 hr tablet TAKE 1 TABLET (50 MG TOTAL) BY MOUTH EVERY MORNING. TAKE WITH OR IMMEDIATELY FOLLOWING A MEAL. 30 tablet 6  . montelukast (SINGULAIR) 10 MG tablet TAKE 1 TABLET BY MOUTH AT BEDTIME 30 tablet 6  . NITROSTAT 0.4 MG SL tablet PLACE 1 TABLET (0.4 MG TOTAL) UNDER THE TONGUE EVERY 5 (FIVE) MINUTES AS NEEDED FOR CHEST PAIN. 25 tablet 1  . omeprazole (PRILOSEC) 40 MG capsule Take 40 mg by mouth 2 (two) times daily.     . polyethylene glycol (MIRALAX / GLYCOLAX) packet Take 17 g by mouth 3 (three) times daily as needed for moderate constipation. For constipation    . primidone (MYSOLINE) 250 MG tablet TAKE 1 TABLET (250 MG TOTAL) BY MOUTH 2 (TWO) TIMES DAILY. 60 tablet 6  . promethazine (PHENERGAN) 25 MG tablet Take 25 mg by mouth every 6 (six) hours as needed. Reported on 04/06/2015  1  . simvastatin (ZOCOR) 40 MG tablet TAKE 1 TABLET (40 MG TOTAL) BY MOUTH EVERY EVENING. 90 tablet 1  . tiZANidine (ZANAFLEX) 4 MG tablet TAKE 1 TABLET BY MOUTH EVERY 8 HOURS AS NEEDED FOR MUSCLE SPASMS. 90 tablet 1  . Zinc 50 MG TABS Take 50 mg by mouth daily.      No current facility-administered medications on file prior to visit.      Current Medications, Allergies, Complete Past Medical History, Past Surgical History, Family History, and Social History were reviewed in [employer] record.  ROS  The following  are not active complaints unless bolded sore throat,   dental problems, itching, sneezing,  nasal congestion or excess/ purulent secretions, ear ache,   fever, chills, sweats, unintended wt loss, classically pleuritic or exertional cp, hemoptysis,  orthopnea pnd or leg swelling, presyncope, palpitations, abdominal pain, anorexia, nausea, vomiting, diarrhea  or change in bowel or bladder habits, change in stools or urine, dysuria,hematuria,  rash, arthralgias, visual complaints, headache, numbness, weakness or ataxia or problems with walking or coordination,  change in mood/affect or memory.           Objective:   Physical Exam  GEN: A/Ox3; pleasant , NAD, frail and elderly in WC  Filed Vitals:   05/02/15 1028  BP: 130/90  Pulse: 94  Temp: 97.6 F (36.4 C)  TempSrc: Oral  Height: '5\' 10"'$  (1.778 m)  Weight: 160 lb (72.576 kg)  SpO2: 92%       HEENT:  [place]/AT,  EACs-clear, TMs-wnl, NOSE-clear, THROAT-clear, no lesions, no postnasal drip or exudate noted. Very poor dentition.   NECK:  Supple w/ fair ROM; no JVD; normal carotid impulses w/o bruits; no thyromegaly or nodules palpated; no lymphadenopathy.  RESP Decreased BS in bases   no accessory muscle use, no dullness to percussion  CARD:  RRR, no m/r/g  , no peripheral edema, pulses intact, no cyanosis or clubbing.  GI:   Soft & nt; nml bowel sounds; no organomegaly or masses detected.  Musco: Warm bil, no deformities or joint swelling noted.   Neuro: alert, no focal deficits noted.    Skin: Warm, no lesions or rashes    Tammy Parrett NP-C  [facility]  05/02/15     Assessment & Plan:

## 2015-05-03 ENCOUNTER — Telehealth: Payer: Self-pay | Admitting: Interventional Cardiology

## 2015-05-03 ENCOUNTER — Other Ambulatory Visit: Payer: Self-pay | Admitting: Family Medicine

## 2015-05-03 ENCOUNTER — Other Ambulatory Visit: Payer: Self-pay | Admitting: *Deleted

## 2015-05-03 ENCOUNTER — Telehealth: Payer: Self-pay | Admitting: Pulmonary Disease

## 2015-05-03 DIAGNOSIS — J449 Chronic obstructive pulmonary disease, unspecified: Secondary | ICD-10-CM

## 2015-05-03 DIAGNOSIS — E875 Hyperkalemia: Secondary | ICD-10-CM

## 2015-05-03 NOTE — Telephone Encounter (Signed)
Medication filled to pharmacy as requested.   

## 2015-05-03 NOTE — Telephone Encounter (Signed)
New Message  Pt called states that it was found that he had calcification in his arteries and he would like to follow up with Dr. Tamala Julian

## 2015-05-03 NOTE — Patient Outreach (Signed)
Weekly transition of care call placed to member.  He states that he still does not feel any better and was seen by the pulmonologist on yesterday.  He stated he requested antibiotics, but did not have a cough, congestion, sore throat, or fever.  Instead, member was prescribed prednisone, which he says he has started taking.  He only complains of weakness and decreased strength.  Physical therapy was again recommended at the office, where he refused, but he states that he has now decided to participate.  He is waiting for a call to schedule the assessment.  He denies any further concerns.  Will continue with calls next week, encouraged to contact this care manager with questions.  Valente Atreyu, BSN, [place] Management  [facility] Care Manager [phone]

## 2015-05-03 NOTE — Telephone Encounter (Signed)
Spoke with pt's wife and confirmed request for PT order. Per TP's OV note PT was recommended. Order placed. Pt's wife advised that someone will contact them within a few days to schedule eval.   COPD GOLD III with reversibility - Melvenia Needles, NP at 05/02/2015 11:02 AM     Status: Written Related Problem: COPD GOLD III with reversibility    Expand All Collapse All   Slow to resolve exacerbation with associated resolving PNA  undelrying lung cancer s/p xrt  Recent CT cehst with improving area of lung cancer in RLL and LLL PNA>   Plan  Begin Prednisone taper -hold at '10mg'$  daily  Mucinex DM Twice daily As needed Cough/congestion  Continue on Pulmicort Neb Twice daily  Continue on Duoneb Four times a day  Continue on Oxygen At bedtime  Follow up .Dr. [name] in 6 weeks Call back if you change your mind regarding Physical therapy.  Please contact office for sooner follow up if symptoms do not improve or worsen or seek emergency care

## 2015-05-03 NOTE — Telephone Encounter (Signed)
Spoke with pt. Adv him that he has a Dx of CAD, which is a build up of plaque/calcification of the arteries in his heart. This is not new for him. Pt sts that he is doing ok from a cardiac stand point, he is sob due to his lung disease, and he has pneumonia. appt scheduled an appt with Dr.Smith for 06/18/15 @ 10:15am. Pt agreeable. Adv him to call back for a sooner appt if cardiac symptoms develop. Pt voiced appreciation for the call back and verbalized understanding

## 2015-05-07 ENCOUNTER — Ambulatory Visit: Payer: Medicare Other | Attending: Adult Health | Admitting: Physical Therapy

## 2015-05-07 ENCOUNTER — Encounter: Payer: Self-pay | Admitting: Physical Therapy

## 2015-05-07 ENCOUNTER — Telehealth: Payer: Self-pay | Admitting: Interventional Cardiology

## 2015-05-07 VITALS — HR 97

## 2015-05-07 DIAGNOSIS — M21371 Foot drop, right foot: Secondary | ICD-10-CM | POA: Insufficient documentation

## 2015-05-07 DIAGNOSIS — R293 Abnormal posture: Secondary | ICD-10-CM | POA: Diagnosis not present

## 2015-05-07 DIAGNOSIS — R2681 Unsteadiness on feet: Secondary | ICD-10-CM | POA: Insufficient documentation

## 2015-05-07 DIAGNOSIS — M6281 Muscle weakness (generalized): Secondary | ICD-10-CM | POA: Diagnosis not present

## 2015-05-07 DIAGNOSIS — F331 Major depressive disorder, recurrent, moderate: Secondary | ICD-10-CM | POA: Diagnosis not present

## 2015-05-07 DIAGNOSIS — R2689 Other abnormalities of gait and mobility: Secondary | ICD-10-CM | POA: Insufficient documentation

## 2015-05-07 DIAGNOSIS — R262 Difficulty in walking, not elsewhere classified: Secondary | ICD-10-CM | POA: Diagnosis not present

## 2015-05-07 NOTE — Telephone Encounter (Signed)
Will route to Dr.Smith to advise

## 2015-05-07 NOTE — Therapy (Signed)
[facility] [address] [place], [place], [address] Phone: [phone]   Fax:  [phone]  Physical Therapy Evaluation  Patient Details  Name: Malik Zuniga MRN: [id number] Date of Birth: 70-18-1947 Referring Provider: Simonne Maffucci, MD  Encounter Date: 05/07/2015      PT End of Session - 05/07/15 1430    Visit Number 1   Number of Visits 17  eval + 16 visits   Date for PT Re-Evaluation 07/06/15   Authorization Type Medicare primary; BCBS secondary - G Codes and PN required every 10 visits   PT Start Time 1316   PT Stop Time 1402   PT Time Calculation (min) 46 min   Equipment Utilized During Treatment Gait belt   Activity Tolerance Other (comment)  Limited by O2 desaturation   Behavior During Therapy [facility] for tasks assessed/performed      Past Medical History  Diagnosis Date  . Hyperlipidemia     takes Simvastatin daily  . Tremor   . History of prostate cancer   . On home O2   . Neuromuscular disorder ([place]) 1998    right carpal tunnel release  . Anemia associated with acute blood loss   . GERD (gastroesophageal reflux disease)     takes Omeprazole daily  . Hypertension     takes Metoprolol daily as well as Hyzaar  . Constipation     takes Colace daily as well as Miralax  . Anxiety     takes Valium daily  . Depression     takes Cymbalta daily  . Emphysema of lung (H[facility])     Albuterol as needed;Symbicort daily and Singulair at bedtime  . Emphysema   . Myocardial infarction ([place]) 04/1998  . Coronary artery disease   . Asthma   . Shortness of breath     with exertion  . History of bronchitis   . Aspiration pneumonia ([place]) 2010  . Headache, chronic daily   . History of migraine     last migraine a couple of days ago;takes Excedrin Migraine  . Chronic back pain     compression fracture  . History of colon polyps   . Mood change ([place])     after Brain surgery mood changed and was placed on  Depakote  . Insomnia     takes Benadryl nightly  . Stroke [facility]) 1998    Brain Aneurysm  . Prostate cancer ([place]) 2004  . Lung cancer [facility])     Past Surgical History  Procedure Laterality Date  . Cholecystectomy  1996  . 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 Annette Stable  . Vascular stent  2000    Dr. Rollene Fare; he reports cardiac stent, but denies stent for PAD 06/15/13  . Hand surgery  1989    crushed thumb, Dr. Fredna Dow  . Ulnar nerve repair  1998    left arm, Dr. Fredna Dow  . Gastrostomy w/ feeding tube  2008    Dr. Watt Climes; only had for a few months  . Prostatectomy  04/2001    removal of prostate cancer, Dr. Janice Norrie  . Coronary angioplasty with stent placement  04/1998  . Carpal tunnel release  1998    right  . Spine surgery    . 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 Back surgery  08/2004; 02/2005; 04/2006; 06/2007; 7/20101    all by Dr. Annette Stable  . Esophagogastroduodenoscopy N/A 07/23/2012    Procedure: ESOPHAGOGASTRODUODENOSCOPY (EGD);  Surgeon: Jeryl Columbia, MD;  Location: [facility] ENDOSCOPY;  Service: Endoscopy;  Laterality: N/A;  buccini /ja  . Colonoscopy    . Back surgery  05/2013  . Dg biopsy lung      Filed Vitals:   05/07/15 1356  Pulse: 97  SpO2: 82%    Visit Diagnosis:  Other abnormalities of gait and mobility  Difficulty in walking, not elsewhere classified  Posture abnormality  Muscle weakness (generalized)  Foot drop, right  Unsteadiness on feet      Subjective Assessment - 05/07/15 1321    Subjective  Diagnosed with lung cancer in 11/2014. Pt has had PNA since 11/2014. Was hospitalized at [facility] hospital again with sepsis due to CAP rom 2/19 - 2/22. For past 5 years, wheelchair has been primary means of commnuity mobility; pt has used cane for all household mobility. Prior to beginning radiation in 12/2014, pt able to negotiate 6 stairs to enter home and walk into bathroom with mod I using SPC; pt currently requires hands-on assist from wife for said aspects of mobility. Pt  has fallen 3-4 times since DC from hospital.   Patient is accompained by: Family member  wife, Malik Zuniga   Pertinent History * Monitor SpO2 closely *   PMH: PNA, lung CA (diagnosed 11/2014; currently no radiation or chemo), COPD, CVA (1998),  aneurysm clipping (1999), kyphoscoliosis, thoracolumbar fusion x8 levels, HTN, HLD, prostate cancer (2004), MI (2000), depression, anxiety, bipolar disorder   Patient Stated Goals "I'm here to get stronger, to improve my endurance."      Currently in Pain? Yes   Pain Score 4    Pain Location Back   Pain Orientation Lower   Pain Descriptors / Indicators Aching   Pain Type Chronic pain   Pain Onset More than a month ago   Pain Frequency Constant   Aggravating Factors  standing, walking for any length of time   Pain Relieving Factors hydrocodone   Effect of Pain on Daily Activities limits standing/walking tolerance; Pain will be monitored by PT but not directly addressed due to nature of referral and chronicity of pain.   Multiple Pain Sites No            [facility] PT Assessment - 05/07/15 0001    Assessment   Medical Diagnosis COPD mixed type   Referring Provider Simonne Maffucci, MD   Onset Date/Surgical Date 11/11/14   Precautions   Precautions Fall   Restrictions   Weight Bearing Restrictions No   Balance Screen   Has the patient fallen in the past 6 months Yes   How many times? 5   Has the patient had a decrease in activity level because of a fear of falling?  Yes   Is the patient reluctant to leave their home because of a fear of falling?  Yes   [place] residence   Living Arrangements Spouse/significant other   Type of [place] to enter   Entrance Stairs-Number of Steps [place] One level   [place] - 2 wheels;Walker - 4 wheels;Cane - single point;Shower seat;Grab bars - tub/shower;Wheelchair - manual   Additional Comments Has  had manual w./c for > 10 years.   Prior Function   Level of Independence Requires assistive device for independence;Needs assistance with ADLs;Needs assistance with homemaking;Needs assistance with gait   Cognition   Overall Cognitive Status Difficult to assess  Observation/Other Assessments   Skin Integrity All visible areas intact. Wife reports pt has had wounds on buttocks in the past which have since resolved. Wife also reports skin breakdown on dorsum of L foot.   Sensation   Light Touch Impaired Detail   Light Touch Impaired Details Impaired RLE   Proprioception Impaired Detail   Proprioception Impaired Details Impaired RLE   Additional Comments Numbness in RLE for "years"; neurosurgeon aware, per pt.   Coordination   Gross Motor Movements are Fluid and Coordinated No   Coordination and Movement Description Crouched posture, difficulty maintaining cervical spine extension to neutral.   Posture/Postural Control   Posture/Postural Control Postural limitations   Postural Limitations Rounded Shoulders;Forward head;Increased thoracic kyphosis;Posterior pelvic tilt;Weight shift left   Posture Comments In seated, lateral trunk lean to L side.   ROM / Strength   AROM / PROM / Strength Strength   Strength   Overall Strength Deficits   Overall Strength Comments R hip flex 2-/5, knee flex/ext 4-/5; 1/5 ankle PF, 0/5 ankle DF. R hip flex 4-/5, L knee flex/ext 4+/5, ankle DF 4-/5, PF 4/5.  tested in seated   Bed Mobility   Bed Mobility --  Not functional, as pt sleeps in recliner   Transfers   Transfers Sit to Stand;Stand to Sit   Stand to Sit 3: Mod assist   Stand to Sit Details (indicate cue type and reason) Manual facilitation for weight shifting   Stand to Sit Details Pt performed sit > stand from manual w/c with BUE support at // bars, mod A, and significant UE dependence.   Ambulation/Gait   Ambulation/Gait Yes   Ambulation/Gait Assistance 4: Min assist   Ambulation/Gait  Assistance Details Gait x7' forward, x7' retro with BUE support at // bars, min A for stability, and ACE bandage at R ankle for DF assist. Short gait trial ended due to SOB. SpO2 noted ot be 82%, HR 97 bpm. SpO2 increased to > 89% within 30 seconds with cueing for slow, pursed lip breathing.   Ambulation Distance (Feet) 7 Feet   Assistive device Parallel bars   Gait Pattern Step-to pattern;Decreased step length - left;Decreased stride length;Decreased stance time - right;Decreased hip/knee flexion - right;Decreased hip/knee flexion - left;Decreased dorsiflexion - right;Decreased weight shift to right;Right flexed knee in stance;Left flexed knee in stance;Trunk flexed;Wide base of support   Ambulation Surface Level;Indoor                           PT Education - 05/07/15 1408    Education provided Yes   Education Details Pt eval findings, goals, and POC. Recommending R AFO for improved ankle stability during gait.  Advise pt/wife to consider custom w/c for positioning, pressure relief, and breath support.    Person(s) Educated Patient;Spouse   Methods Explanation   Comprehension Verbalized understanding          PT Short Term Goals - 05/07/15 1450    PT SHORT TERM GOAL #1   Title Pt will independently perform HEP to maximize functional gains made in PT.    (Target date: 06/04/15)   PT SHORT TERM GOAL #2   Title Orthotist will be present for PT session to assess/address if pt is appropriate candidate for R AFO to improve safety with short-distance household ambulation.   (Target date: 06/04/15)   PT SHORT TERM GOAL #3   Title Schedule consult for custom w/c to improve positioning, postural alignment, contracture prevention,  preservation of skin integrity, and improved breath support.    (Target date: 06/04/15)   PT SHORT TERM GOAL #4   Title Pt will perform sit > stand from w/c with min A using LRAD to indicate increased safety with functional transfers.    (Target date:  06/04/15)   PT SHORT TERM GOAL #5   Title Pt will ambulate x20' with Min A using LRAD with SpO2 > 89% to indicate improved activity tolerance.    (Target date: 06/04/15)   Additional Short Term Goals   Additional Short Term Goals Yes   PT SHORT TERM GOAL #6   Title Pt will negotiate 6 stairs with single R rail to with Min A with SpO2 > 89% to indicate increased pt safety using primary home entrance.  (Target date: 06/04/15)           PT Long Term Goals - 05/07/15 1945    PT LONG TERM GOAL #1   Title Pt will perform sit > stand from w/c with supervision using LRAD to indicate increased safety with functional transfers.    (Target date: 07/02/15)   PT LONG TERM GOAL #2   Title Pt will ambulate 50' with supervision using LRAD with SpO2 > 89% to indicate increased safety with household ambulation.      (Target date: 07/02/15)   PT LONG TERM GOAL #3   Title Pt will negotiate 6 stairs with single R rail to with Min A with SpO2 > 89% to indicate increased pt safety using primary home entrance.    (Target date: 07/02/15)   PT LONG TERM GOAL #4   Title Pt will perform self-propulsion of w/c > 150' with mod I and SpO2 > 89% to indicate increased independence with w/c mobility.  (Target date: 07/02/15)               Plan - 05/07/15 1415    Clinical Impression Statement Pt is a 70 y/o M referred to outpatient PT to address functional impairments associated with COPD and pneumonia. Pt hospitalized from 2/19-2/22/17 with sepsis due to CAP.  PMH significant for: PNA, lung CA (diagnosed 11/2014; currently no radiation or chemo), COPD, CVA (1998),  aneurysm clipping (1999), kyphoscoliosis, thoracolumbar fusion x8 levels, HTN, HLD, prostate cancer (2004), MI (2000), depression, anxiety, bipolar disorder. PT evaluation reveals decreased CV endurance, decreased SpO2 with minimal activity (SpO2 82% after gait x7' at parallel bars), limited activity tolerance, impaired postural alignment, R foot drop,  generalized weakness, frequent falls, and decreased stability/independence with functional mobility. Pt will benefit from  skilled outpatient PT 2x/week for 8 weeks to address aid impairments and progress toward PLOF.    Pt will benefit from skilled therapeutic intervention in order to improve on the following deficits Abnormal gait;Cardiopulmonary status limiting activity;Decreased activity tolerance;Decreased balance;Decreased endurance;Decreased skin integrity;Decreased strength;Difficulty walking;Impaired sensation;Postural dysfunction;Pain;Other (comment);Dizziness  Pain will be monitored but not directly addressed by PT due to chronicity of pain and nature of PT referral.    Rehab Potential Fair   Clinical Impairments Affecting Rehab Potential complex medical history   PT Frequency 2x / week   PT Duration 8 weeks   PT Treatment/Interventions ADLs/Self Care Home Management;DME Instruction;Stair training;Gait training;Neuromuscular re-education;Functional mobility training;Therapeutic activities;Therapeutic exercise;Balance training;Orthotic Fit/Training;Patient/family education;Wheelchair mobility training;Vestibular   PT Next Visit Plan Assess stair negotiation. Discuss squat pivot transfers to increase pt independence. Initiate HEP with emphasis on CV endurance   Consulted and Agree with Plan of Care Patient  G-Codes - 05/07/15 1953    Functional Assessment Tool Used SpO2 82% after ambulation x7' at parallel bars   Functional Limitation Mobility: Walking and moving around   Mobility: Walking and Moving Around Current Status [phone]) At least 80 percent but less than 100 percent impaired, limited or restricted   Mobility: Walking and Moving Around Goal Status [phone]) At least 40 percent but less than 60 percent impaired, limited or restricted       Problem List Patient Active Problem List   Diagnosis Date Noted  . Sepsis ([place]) 04/01/2015  . CAP (community acquired pneumonia)  04/01/2015  . Cancer of lower lobe of right lung ([place]) 04/01/2015  . Chronic respiratory failure with hypoxia ([place]) 04/01/2015  . Cough 03/08/2015  . Dysphagia 02/01/2015  . Primary cancer of right lower lobe of lung ([place]) 12/05/2014  . Lung mass   . Pressure sore on elbow 05/10/2014  . Abscess of buttock, right 04/12/2014  . Recurrent aspiration pneumonia ([place]) 11/16/2013  . Toenail fungus 11/06/2013  . HCAP (healthcare-associated pneumonia) 10/23/2013  . Hypotension 10/16/2013  . 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 CAD (coronary artery disease) 07/27/2013  . Thoracic compression fracture ([place]) 06/21/2013  . Thoracic spine fracture ([place]) 06/21/2013  . Giant comedone 11/04/2012  . Nausea with vomiting 07/24/2012  . Unspecified constipation 07/24/2012  . Abdominal pain, acute, epigastric 07/22/2012  . Hyponatremia 07/22/2012  . Chronic lower back pain 07/22/2012  . Transaminitis 07/22/2012  . Prostate cancer ([place]) 04/22/2012  . Routine general medical examination at a health care facility 04/22/2012  . Pseudomonas infection 11/24/2011  . E coli bacteremia 11/24/2011  . Postoperative infection 11/24/2011  . Anemia associated with acute blood loss   . Bacteremia due to Pseudomonas 10/04/2011  . Sepsis(995.91) 10/02/2011  . 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 Falls frequently 07/31/2011  . COPD  GOLD III with reversibility  01/14/2011  . Allergic rhinitis, seasonal 12/18/2010  . Angioedema 10/31/2010  . Neoplasm of uncertain behavior of skin 05/07/2010  . MYOCARDIAL INFARCTION 02/20/2010  . C V A / STROKE 02/20/2010  . CANDIDIASIS 02/13/2010  . Hyperlipidemia 02/13/2010  . Bipolar disorder ([place]) 02/13/2010  . DEPRESSION 02/13/2010  . Essential hypertension 02/13/2010  . TREMOR 02/13/2010  . PROSTATE CANCER, HX OF 02/13/2010  . Hx of TIA (transient ischemic attack) and stroke 02/13/2010  . Chronic daily headache 02/13/2010    Billie Ruddy, PT, DPT [facility] [address] [place], [place], [address] Phone: [phone]   Fax:  [phone] 05/07/2015, 7:55 PM  Name: OTTAVIO NOREM MRN: [id number] Date of Birth: March 21, 1945

## 2015-05-07 NOTE — Telephone Encounter (Signed)
°  New Prob   Dr. Casimiro Needle would like to offer Ritalin for refractory depression to pt. Calling to verify pt is okay to take this medication. Please call.

## 2015-05-09 ENCOUNTER — Other Ambulatory Visit: Payer: Self-pay | Admitting: *Deleted

## 2015-05-09 MED ORDER — SIMVASTATIN 40 MG PO TABS: ORAL_TABLET | ORAL | Status: DC

## 2015-05-09 NOTE — Telephone Encounter (Signed)
Refill sent per [facility] refill protocol/SLS  

## 2015-05-10 ENCOUNTER — Other Ambulatory Visit: Payer: Self-pay | Admitting: *Deleted

## 2015-05-10 DIAGNOSIS — J441 Chronic obstructive pulmonary disease with (acute) exacerbation: Secondary | ICD-10-CM

## 2015-05-10 NOTE — Patient Outreach (Signed)
Weekly transition of care call placed to member, wife answers phone and state "he is actually doing better."  She reports that he has continued with Prednisone, which has "helped a lot."  She states that he has been evaluated by the outpatient rehab center and he has his first session on Friday.  She reports that the member will be seen twice a week for the next 8 weeks.  She state that he continues to have trouble breathing at times, but "he will always have that."  She denies any further concerns at this time.  Made aware that goals were met for transition of care program, member and wife would like to continue with telephonic health coach as they were previously involved with [facility] prior to admission.  Will refer back to health coach.  Valente Linken, BSN, [facility] [facility]  [facility] Care Manager [phone]

## 2015-05-10 NOTE — Telephone Encounter (Signed)
Would be at increased risk for arrhythmia and ischemia

## 2015-05-11 ENCOUNTER — Ambulatory Visit: Payer: Medicare Other | Admitting: Physical Therapy

## 2015-05-11 VITALS — HR 105

## 2015-05-11 DIAGNOSIS — R2681 Unsteadiness on feet: Secondary | ICD-10-CM | POA: Diagnosis not present

## 2015-05-11 DIAGNOSIS — R293 Abnormal posture: Secondary | ICD-10-CM | POA: Diagnosis not present

## 2015-05-11 DIAGNOSIS — M21371 Foot drop, right foot: Secondary | ICD-10-CM | POA: Diagnosis not present

## 2015-05-11 DIAGNOSIS — M6281 Muscle weakness (generalized): Secondary | ICD-10-CM | POA: Diagnosis not present

## 2015-05-11 DIAGNOSIS — R2689 Other abnormalities of gait and mobility: Secondary | ICD-10-CM | POA: Diagnosis not present

## 2015-05-11 DIAGNOSIS — R262 Difficulty in walking, not elsewhere classified: Secondary | ICD-10-CM | POA: Diagnosis not present

## 2015-05-11 NOTE — Patient Instructions (Signed)
Functional Quadriceps: Sit to Stand    With your walker in front of you, perform 5 partial stands from your wheelchair or recliner. Remember to exhale on exertion.  Try to perform 5 partial stands every 30 minutes, if possible. Check your oxygen; make sure it stays above 89%. If oxygen saturation goes below 90%, stop and rest.

## 2015-05-12 ENCOUNTER — Telehealth: Payer: Self-pay | Admitting: Physical Therapy

## 2015-05-12 DIAGNOSIS — M21371 Foot drop, right foot: Secondary | ICD-10-CM

## 2015-05-12 DIAGNOSIS — R296 Repeated falls: Secondary | ICD-10-CM

## 2015-05-12 DIAGNOSIS — Z8673 Personal history of transient ischemic attack (TIA), and cerebral infarction without residual deficits: Secondary | ICD-10-CM

## 2015-05-12 NOTE — Telephone Encounter (Signed)
Dr. [name],  I've been seeing Malik Zuniga for PT at outpatient neurorehab. After completing the PT evaluation, I feel as though the patient would greatly benefit from a custom wheelchair, as he has been non-ambulatory for years and currently utilizes a standard wheelchair for all mobility. A custom wheelchair would improve positioning/alignment, increase patient independence with self-propulsion (which is currently limited by decreased SpO2), and would prevent contractures and skin breakdown.   The patient does also need to negotiate stairs to use primary home entrance. Moreover, he ambulates short distances to/from his bathroom, as his wheelchair doesn't fit through his bathroom door. The patient has right foot drop, which significantly increases his risk of falling while performing these aspects of mobility. The patient utilized a carbon fiber AFO on his right ankle during our first session, which improved gait stability markedly.  If you agree, please place orders for a right AFO and for a custom wheelchair evaluation. Please don't hesitate to contact me with any further questions. Thanks so much for your help with this.  Billie Ruddy, PT, DPT [facility] [address] [place], [place], [address] Phone: [phone]   Fax:  [phone] 05/12/2015, 7:43 PM

## 2015-05-12 NOTE — Therapy (Signed)
[facility] [address] [place], [place], [address] Phone: [phone]   Fax:  [phone]  Physical Therapy Treatment  Patient Details  Name: Malik Zuniga MRN: [id number] Date of Birth: 06-05-1945 Referring Provider: Simonne Maffucci, MD  Encounter Date: 05/11/2015      PT End of Session - 05/12/15 1928    Visit Number 2   Number of Visits 17   Date for PT Re-Evaluation 07/06/15   Authorization Type Medicare primary; BCBS secondary - G Codes and PN required every 10 visits   PT Start Time 1451   PT Stop Time 1535   PT Time Calculation (min) 44 min   Equipment Utilized During Treatment Gait belt   Activity Tolerance Other (comment)  Limited by decreased SpO2   Behavior During Therapy [facility] for tasks assessed/performed      Past Medical History  Diagnosis Date  . Hyperlipidemia     takes Simvastatin daily  . Tremor   . History of prostate cancer   . On home O2   . Neuromuscular disorder ([place]) 1998    right carpal tunnel release  . Anemia associated with acute blood loss   . GERD (gastroesophageal reflux disease)     takes Omeprazole daily  . Hypertension     takes Metoprolol daily as well as Hyzaar  . Constipation     takes Colace daily as well as Miralax  . Anxiety     takes Valium daily  . Depression     takes Cymbalta daily  . Emphysema of lung ([facility])     Albuterol as needed;Symbicort daily and Singulair at bedtime  . Emphysema   . Myocardial infarction ([place]) 04/1998  . Coronary artery disease   . Asthma   . Shortness of breath     with exertion  . History of bronchitis   . Aspiration pneumonia ([place]) 2010  . Headache, chronic daily   . History of migraine     last migraine a couple of days ago;takes Excedrin Migraine  . Chronic back pain     compression fracture  . History of colon polyps   . Mood change ([place])     after Brain surgery mood changed and was placed on Depakote  . Insomnia    takes Benadryl nightly  . Stroke [facility]) 1998    Brain Aneurysm  . Prostate cancer ([place]) 2004  . Lung cancer [facility])     Past Surgical History  Procedure Laterality Date  . Cholecystectomy  1996  . 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 Annette Stable  . Vascular stent  2000    Dr. Rollene Fare; he reports cardiac stent, but denies stent for PAD 06/15/13  . Hand surgery  1989    crushed thumb, Dr. Fredna Dow  . Ulnar nerve repair  1998    left arm, Dr. Fredna Dow  . Gastrostomy w/ feeding tube  2008    Dr. Watt Climes; only had for a few months  . Prostatectomy  04/2001    removal of prostate cancer, Dr. Janice Norrie  . Coronary angioplasty with stent placement  04/1998  . Carpal tunnel release  1998    right  . Spine surgery    . 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 Back surgery  08/2004; 02/2005; 04/2006; 06/2007; 7/20101    all by Dr. Annette Stable  . Esophagogastroduodenoscopy N/A 07/23/2012    Procedure: ESOPHAGOGASTRODUODENOSCOPY (EGD);  Surgeon: Jeryl Columbia, MD;  Location:  [facility];  Service: Endoscopy;  Laterality: N/A;  buccini /ja  . Colonoscopy    . Back surgery  05/2013  . Dg biopsy lung      Filed Vitals:   05/11/15 1453 05/11/15 1512  Pulse: 110 105  SpO2: 89% 88%    Visit Diagnosis:  Other abnormalities of gait and mobility  Foot drop, right  Unsteadiness on feet  Muscle weakness (generalized)      Subjective Assessment - 05/11/15 1453    Subjective "I'm actually feeling quite good today. The prednisone is really helping."   Patient is accompained by: Family member  wife, [name]   Pertinent History * Monitor SpO2 closely *   PMH: PNA, lung CA (diagnosed 11/2014; currently no radiation or chemo), COPD, CVA (1998),  aneurysm clipping (1999), kyphoscoliosis, thoracolumbar fusion x8 levels, HTN, HLD, prostate cancer (2004), MI (2000), depression, anxiety, bipolar disorder   Patient Stated Goals "I'm here to get stronger, to improve my endurance."      Currently in Pain? Yes   Pain Score 3     Pain Location Back   Pain Orientation Lower   Pain Descriptors / Indicators Aching   Pain Type Chronic pain   Pain Onset More than a month ago   Pain Frequency Constant   Aggravating Factors  standing, walking for any length of time   Pain Relieving Factors hydrocodone   Multiple Pain Sites No                         [facility] Adult PT Treatment/Exercise - 05/12/15 0001    Transfers   Transfers Sit to Stand;Stand to Sit   Stand to Sit 4: Min assist   Stand to Sit Details Sit > stand from personal w/c with RW with Min A, manual facilitation of anterior weight shift.   Ambulation/Gait   Ambulation/Gait Yes   Ambulation/Gait Assistance 4: Min guard   Ambulation/Gait Assistance Details Gait x12', x 4', then x16' (seated rest breaks between all trials) with RW, R AFO (Reaction), and min guard for stability/balance.   Ambulation Distance (Feet) 16 Feet  longest gait trial = 16' consecutively   Assistive device Rolling walker;Other (Comment)  R AFO (Reaction)   Gait Pattern Step-to pattern;Decreased step length - left;Decreased stride length;Decreased stance time - right;Decreased hip/knee flexion - right;Decreased hip/knee flexion - left;Decreased dorsiflexion - right;Decreased weight shift to right;Right flexed knee in stance;Left flexed knee in stance;Trunk flexed;Wide base of support   Ambulation Surface Level;Indoor   Stairs Yes   Stairs Assistance 4: Min assist   Stairs Assistance Details (indicate cue type and reason) Pt demonstrated current technique for negotiating stairs to enter home, which is forward-facing with RUE support at R rail, step-to pattern. Pt explained and demontrstated lateral stair negotiation with BUE support on R rail and step-to paterrn. Pt gave efffective return demo of lateral stair negotiation. Min A provided during all stair negotiation; however, no overt LOB noted.  using R AFO (Reaction)   Stair Management Technique One rail Right;Step to  pattern;Sideways;Forwards   Number of Stairs 8  4 stairs x2 trials (seated rest breaks between trials)   Height of Stairs 6   Posture/Postural Control   Posture/Postural Control Postural limitations   Postural Limitations Rounded Shoulders;Forward head;Increased thoracic kyphosis;Posterior pelvic tilt;Weight shift left   Posture Comments In seated, lateral trunk lean to L side.                PT  Education - 05/12/15 1917    Education provided Yes   Education Details Recommending RW for all mobility to minimize fall risk. Initiated home exercise for LE strengthening and pressure relief; see Pt Instructions for details. Recommending pt use pulse ox to ensure SpO2 > 89% with all activity. Provided pt/wife with dates for orthotist consult and w/c evaluation.  All education provided during seated rest breaks   Person(s) Educated Patient;Spouse   Methods Explanation;Demonstration;Verbal cues;Handout   Comprehension Verbalized understanding;Returned demonstration          PT Short Term Goals - 05/12/15 1935    PT SHORT TERM GOAL #1   Title Pt will independently perform HEP to maximize functional gains made in PT.    (Target date: 06/04/15)   Status On-going   PT SHORT TERM GOAL #2   Title Orthotist will be present for PT session to assess/address if pt is appropriate candidate for R AFO to improve safety with short-distance household ambulation.   (Target date: 06/04/15)   Status On-going   PT SHORT TERM GOAL #3   Title Schedule consult for custom w/c to improve positioning, postural alignment, contracture prevention, preservation of skin integrity, and improved breath support.    (Target date: 06/04/15)   Baseline Scheduled for 4/18.   Status Achieved   PT SHORT TERM GOAL #4   Title Pt will perform sit > stand from w/c with min A using LRAD to indicate increased safety with functional transfers.    (Target date: 06/04/15)   Status On-going   PT SHORT TERM GOAL #5   Title Pt will  ambulate x20' with Min A using LRAD with SpO2 > 89% to indicate improved activity tolerance.    (Target date: 06/04/15)   Status On-going   PT SHORT TERM GOAL #6   Title Pt will negotiate 6 stairs with single R rail to with Min A with SpO2 > 89% to indicate increased pt safety using primary home entrance.  (Target date: 06/04/15)   Status On-going           PT Long Term Goals - 05/12/15 1935    PT LONG TERM GOAL #1   Title Pt will perform sit > stand from w/c with supervision using LRAD to indicate increased safety with functional transfers.    (Target date: 07/02/15)   Status On-going   PT LONG TERM GOAL #2   Title Pt will ambulate 62' with supervision using LRAD with SpO2 > 89% to indicate increased safety with household ambulation.      (Target date: 07/02/15)   Status On-going   PT LONG TERM GOAL #3   Title Pt will negotiate 6 stairs with single R rail to with Min A with SpO2 > 89% to indicate increased pt safety using primary home entrance.    (Target date: 07/02/15)   Status On-going   PT LONG TERM GOAL #4   Title Pt will perform self-propulsion of w/c > 150' with mod I and SpO2 > 89% to indicate increased independence with w/c mobility.  (Target date: 07/02/15)   Status On-going               Plan - 05/12/15 1930    Clinical Impression Statement Session focused on increasing pt stability/independence with gait and stair negotiation; initiating HEP; and providing education on both pressure relief and importance of maintaining SpO2 > 89% during activity.  Lowest SpO2 during this session was 88% after negotiation of stairs x4. R Reaction AFO was effective  in providing DF assist and controlling R knee buckling.    Pt will benefit from skilled therapeutic intervention in order to improve on the following deficits Abnormal gait;Cardiopulmonary status limiting activity;Decreased activity tolerance;Decreased balance;Decreased endurance;Decreased skin integrity;Decreased  strength;Difficulty walking;Impaired sensation;Postural dysfunction;Pain;Dizziness   Clinical Impairments Affecting Rehab Potential complex medical history   PT Frequency 2x / week   PT Duration 8 weeks   PT Treatment/Interventions ADLs/Self Care Home Management;DME Instruction;Stair training;Gait training;Neuromuscular re-education;Functional mobility training;Therapeutic activities;Therapeutic exercise;Balance training;Orthotic Fit/Training;Patient/family education;Wheelchair mobility training;Vestibular   PT Next Visit Plan Ask if pt has been using pulse ox at home and, if so, asl how SpO2 has been with activity. Expand on HEP. Trial AFO's other than Reaction.    Recommended Other Services Orthotist consult scheduled for 4/10. Wheelchair evaluation on 4/18.   Consulted and Agree with Plan of Care Patient;Family member/caregiver   Family Member Consulted wife, [name]        Problem List Patient Active Problem List   Diagnosis Date Noted  . Sepsis ([place]) 04/01/2015  . CAP (community acquired pneumonia) 04/01/2015  . Cancer of lower lobe of right lung ([place]) 04/01/2015  . Chronic respiratory failure with hypoxia ([place]) 04/01/2015  . Cough 03/08/2015  . Dysphagia 02/01/2015  . Primary cancer of right lower lobe of lung ([place]) 12/05/2014  . Lung mass   . Pressure sore on elbow 05/10/2014  . Abscess of buttock, right 04/12/2014  . Recurrent aspiration pneumonia ([place]) 11/16/2013  . Toenail fungus 11/06/2013  . HCAP (healthcare-associated pneumonia) 10/23/2013  . Hypotension 10/16/2013  . 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 CAD (coronary artery disease) 07/27/2013  . Thoracic compression fracture ([place]) 06/21/2013  . Thoracic spine fracture ([place]) 06/21/2013  . Giant comedone 11/04/2012  . Nausea with vomiting 07/24/2012  . Unspecified constipation 07/24/2012  . Abdominal pain, acute, epigastric 07/22/2012  . Hyponatremia 07/22/2012  . Chronic lower back pain 07/22/2012  . Transaminitis  07/22/2012  . Prostate cancer ([place]) 04/22/2012  . Routine general medical examination at a health care facility 04/22/2012  . Pseudomonas infection 11/24/2011  . E coli bacteremia 11/24/2011  . Postoperative infection 11/24/2011  . Anemia associated with acute blood loss   . Bacteremia due to Pseudomonas 10/04/2011  . Sepsis(995.91) 10/02/2011  . 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 Falls frequently 07/31/2011  . COPD  GOLD III with reversibility  01/14/2011  . Allergic rhinitis, seasonal 12/18/2010  . Angioedema 10/31/2010  . Neoplasm of uncertain behavior of skin 05/07/2010  . MYOCARDIAL INFARCTION 02/20/2010  . C V A / STROKE 02/20/2010  . CANDIDIASIS 02/13/2010  . Hyperlipidemia 02/13/2010  . Bipolar disorder ([place]) 02/13/2010  . DEPRESSION 02/13/2010  . Essential hypertension 02/13/2010  . TREMOR 02/13/2010  . PROSTATE CANCER, HX OF 02/13/2010  . Hx of TIA (transient ischemic attack) and stroke 02/13/2010  . Chronic daily headache 02/13/2010    Billie Ruddy, PT, DPT [facility] [address] [place], [place], [address] Phone: [phone]   Fax:  [phone] 05/12/2015, 7:36 PM  Name: Malik Zuniga MRN: [id number] Date of Birth: 11-Jan-1946

## 2015-05-14 ENCOUNTER — Ambulatory Visit: Payer: Medicare Other | Attending: Adult Health | Admitting: Physical Therapy

## 2015-05-14 ENCOUNTER — Other Ambulatory Visit (HOSPITAL_COMMUNITY): Payer: Self-pay | Admitting: Psychiatry

## 2015-05-14 VITALS — HR 97

## 2015-05-14 DIAGNOSIS — R2689 Other abnormalities of gait and mobility: Secondary | ICD-10-CM | POA: Insufficient documentation

## 2015-05-14 DIAGNOSIS — R2681 Unsteadiness on feet: Secondary | ICD-10-CM | POA: Diagnosis not present

## 2015-05-14 DIAGNOSIS — M21371 Foot drop, right foot: Secondary | ICD-10-CM | POA: Insufficient documentation

## 2015-05-14 DIAGNOSIS — M6281 Muscle weakness (generalized): Secondary | ICD-10-CM

## 2015-05-14 DIAGNOSIS — R293 Abnormal posture: Secondary | ICD-10-CM | POA: Diagnosis not present

## 2015-05-14 NOTE — Patient Instructions (Signed)
Begin standing with both hands on your walker for 3.5 minutes consecutively as long as oxygen saturation is > 89%. Progress by adding 30 seconds, as tolerated, every 3 to 4 days. If your ankle begins to hurt, decrease time in standing until ankle pain subsides. Perform 1-2 times per day.  Functional Quadriceps: Sit to Partial Stand   With your walker in front of you, perform 5 partial stands from your wheelchair or recliner. Remember to exhale on exertion. Try to perform 5 partial stands every 30 minutes, if possible. Check your oxygen; make sure it stays above 89%. If oxygen saturation goes below 90%, stop and rest.

## 2015-05-14 NOTE — Telephone Encounter (Signed)
Hi, I agree, but please send this request to [facility].  If they can't fulfill the request then I will take care of it.

## 2015-05-14 NOTE — Therapy (Signed)
[facility] [address] [place], [place], [address] Phone: [phone]   Fax:  [phone]  Physical Therapy Treatment  Patient Details  Name: Malik Zuniga MRN: [id number] Date of Birth: [birth year] Referring Provider: Simonne Maffucci, MD  Encounter Date: 05/14/2015      PT End of Session - 05/14/15 2035    Visit Number 3   Number of Visits 17   Date for PT Re-Evaluation 07/06/15   Authorization Type Medicare primary; BCBS secondary - G Codes and PN required every 10 visits   PT Start Time 0934   PT Stop Time 1019   PT Time Calculation (min) 45 min   Equipment Utilized During Treatment Gait belt   Activity Tolerance Patient tolerated treatment well;Patient limited by fatigue  SpO2 remained > 91% throughout session   Behavior During Therapy [facility] for tasks assessed/performed      Past Medical History  Diagnosis Date  . Hyperlipidemia     takes Simvastatin daily  . Tremor   . History of prostate cancer   . On home O2   . Neuromuscular disorder ([place]) 1998    right carpal tunnel release  . Anemia associated with acute blood loss   . GERD (gastroesophageal reflux disease)     takes Omeprazole daily  . Hypertension     takes Metoprolol daily as well as Hyzaar  . Constipation     takes Colace daily as well as Miralax  . Anxiety     takes Valium daily  . Depression     takes Cymbalta daily  . Emphysema of lung ([facility])     Albuterol as needed;Symbicort daily and Singulair at bedtime  . Emphysema   . Myocardial infarction ([place]) 04/1998  . Coronary artery disease   . Asthma   . Shortness of breath     with exertion  . History of bronchitis   . Aspiration pneumonia ([place]) 2010  . Headache, chronic daily   . History of migraine     last migraine a couple of days ago;takes Excedrin Migraine  . Chronic back pain     compression fracture  . History of colon polyps   . Mood change ([place])     after Brain surgery  mood changed and was placed on Depakote  . Insomnia     takes Benadryl nightly  . 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 Prostate cancer ([place]) 2004  . Lung cancer [facility])     Past Surgical History  Procedure Laterality Date  . Cholecystectomy  1996  . 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 Annette Stable  . Vascular stent  2000    Dr. Rollene Fare; he reports cardiac stent, but denies stent for PAD 06/15/13  . Hand surgery  1989    crushed thumb, Dr. Fredna Dow  . Ulnar nerve repair  1998    left arm, Dr. Fredna Dow  . Gastrostomy w/ feeding tube  2008    Dr. Watt Climes; only had for a few months  . Prostatectomy  04/2001    removal of prostate cancer, Dr. Janice Norrie  . Coronary angioplasty with stent placement  04/1998  . Carpal tunnel release  1998    right  . Spine surgery    . 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 Back surgery  08/2004; 02/2005; 04/2006; 06/2007; 7/20101    all by Dr. Annette Stable  . Esophagogastroduodenoscopy N/A 07/23/2012    Procedure: ESOPHAGOGASTRODUODENOSCOPY (  EGD);  Surgeon: Jeryl Columbia, MD;  Location: [facility] ENDOSCOPY;  Service: Endoscopy;  Laterality: N/A;  buccini /ja  . Colonoscopy    . Back surgery  05/2013  . Dg biopsy lung      Filed Vitals:   05/14/15 0935 05/14/15 1008 05/14/15 1009  Pulse: 97 100 97  SpO2: 92% 94% 92%    Visit Diagnosis:  Other abnormalities of gait and mobility  Foot drop, right  Unsteadiness on feet  Muscle weakness (generalized)  Posture abnormality      Subjective Assessment - 05/14/15 0935    Subjective "I used that gizmo (pulse ox), and I noticed that my oxygen levels were fine during the day, but they dropped to 87% ot 88% at night time, even when I was resting." Pt also states, "i practiced walking up the steps like we talked about, and it does seem to be safer and easier."   Patient is accompained by: Family member  wife, [name]   Pertinent History * Monitor SpO2 closely *   PMH: PNA, lung CA (diagnosed 11/2014; currently no radiation or chemo),  COPD, CVA (1998),  aneurysm clipping (1999), kyphoscoliosis, thoracolumbar fusion x8 levels, HTN, HLD, prostate cancer (2004), MI (2000), depression, anxiety, bipolar disorder   Patient Stated Goals "I'm here to get stronger, to improve my endurance."      Currently in Pain? Yes   Pain Score 4    Pain Location Back   Pain Orientation Lower   Pain Descriptors / Indicators Aching   Pain Type Chronic pain   Pain Onset More than a month ago   Pain Frequency Constant   Aggravating Factors  standing and walking for any length of time   Pain Relieving Factors pain medication   Effect of Pain on Daily Activities limited standing and walking tolerance.   Multiple Pain Sites No                         [facility] Adult PT Treatment/Exercise - 05/14/15 0001    Transfers   Transfers Sit to Stand;Stand to Sit   Sit to Stand 5: Supervision   Sit to Stand Details (indicate cue type and reason) Initially provided cueing for setup, hand placement (most independent with single UE support at RW to promote full anterior wieght shift). After initial trial, pt demonstrated effective within-session carryover with all remaining sit > stand transfers.   Stand to Sit 5: Supervision   Stand to Sit Details Min cueing initially for hand placement with effective within-session carryover.   Ambulation/Gait   Ambulation/Gait Yes   Ambulation/Gait Assistance 5: Supervision   Ambulation/Gait Assistance Details Gait x22' with RW and R Blue Rocker AFO with (S), cueing to maintain wider (more normalized) BOS during turning; cueing also for increased LLE step length for increased lateral weight shift to R side. Gait x27' with RW and R Reaction AFO with cueing for upright posture (neck extension, forward gaze).   Ambulation Distance (Feet) 27 Feet  x22' then x27'   Assistive device Rolling walker;Other (Comment)  R Blue Rocker AFO then R Reaction AFO   Gait Pattern Decreased step length - left;Decreased stride  length;Decreased stance time - right;Decreased hip/knee flexion - right;Decreased hip/knee flexion - left;Decreased dorsiflexion - right;Decreased weight shift to right;Right flexed knee in stance;Left flexed knee in stance;Trunk flexed;Wide base of support;Step-through pattern;Step-to pattern   Ambulation Surface Level;Indoor   Stairs Yes   Stairs Assistance 5: Supervision   Stairs Assistance  Details (indicate cue type and reason) Effective between-session carryover of lateral stair negotiation technique without cueing from this PT.   Stair Management Technique One rail Right;Step to pattern;Sideways;Forwards   Number of Stairs 4   Height of Stairs 6   Gait Comments Pt noted increased discomfort in medial ankle when using R Blue Rocker AFO; discomfort improved with use of R Reaction AFO.   Posture/Postural Control   Posture/Postural Control Postural limitations   Postural Limitations Rounded Shoulders;Forward head;Increased thoracic kyphosis;Posterior pelvic tilt;Weight shift left   Posture Comments In standing, pt tends to maintain cervical spine in flexed posture.   Exercises   Exercises Other Exercises   Other Exercises  Static standing x3.5 minutes for endurance. Trial ended due to increased fatigue in BLE's, as indicated by BLE tremoring. SpO2 94%, HR 100 bpm.                PT Education - 05/14/15 2023    Education provided Yes   Education Details Added standing program to HEP; see Pt Instructions for details. Suggested R ASO to prevent injury with ambulation.    Person(s) Educated Patient;Spouse   Methods Explanation;Demonstration;Verbal cues;Handout   Comprehension Verbalized understanding;Returned demonstration          PT Short Term Goals - 05/14/15 2037    PT SHORT TERM GOAL #1   Title Pt will independently perform HEP to maximize functional gains made in PT.    (Target date: 06/04/15)   Status On-going   PT SHORT TERM GOAL #2   Title Orthotist will be present  for PT session to assess/address if pt is appropriate candidate for R AFO to improve safety with short-distance household ambulation.   (Target date: 06/04/15)   Status On-going   PT SHORT TERM GOAL #3   Title Schedule consult for custom w/c to improve positioning, postural alignment, contracture prevention, preservation of skin integrity, and improved breath support.    (Target date: 06/04/15)   Baseline Scheduled for 4/18.   Status Achieved   PT SHORT TERM GOAL #4   Title Pt will perform sit > stand from w/c with min A using LRAD to indicate increased safety with functional transfers.    (Target date: 06/04/15)   Baseline Met 4/3.   Status Achieved   PT SHORT TERM GOAL #5   Title Pt will ambulate x20' with Min A using LRAD with SpO2 > 89% to indicate improved activity tolerance.    (Target date: 06/04/15)   Baseline Met 4/3.   Status Achieved   PT SHORT TERM GOAL #6   Title Pt will negotiate 6 stairs with single R rail to with Min A with SpO2 > 89% to indicate increased pt safety using primary home entrance.  (Target date: 06/04/15)   Status On-going           PT Long Term Goals - 05/12/15 1935    PT LONG TERM GOAL #1   Title Pt will perform sit > stand from w/c with supervision using LRAD to indicate increased safety with functional transfers.    (Target date: 07/02/15)   Status On-going   PT LONG TERM GOAL #2   Title Pt will ambulate 34' with supervision using LRAD with SpO2 > 89% to indicate increased safety with household ambulation.      (Target date: 07/02/15)   Status On-going   PT LONG TERM GOAL #3   Title Pt will negotiate 6 stairs with single R rail to with Min A with SpO2 >  89% to indicate increased pt safety using primary home entrance.    (Target date: 07/02/15)   Status On-going   PT LONG TERM GOAL #4   Title Pt will perform self-propulsion of w/c > 150' with mod I and SpO2 > 89% to indicate increased independence with w/c mobility.  (Target date: 07/02/15)   Status On-going                Plan - 05/14/15 2037    Clinical Impression Statement Session continued to focus on increasing stability with gait and stair negotiation. Pt met STG's for both sit > stand and for gait x20' with SpO2 > 89%. SpO2 remained > 91% throughout today's session. Pt demonstrates excellent compliance and carryover with PT recommendations and is already making excellent progress in PT. Initiated standing program for endurance; walking program not yet initiated due to safety concerns with R foot drop, ankle pain.    Pt will benefit from skilled therapeutic intervention in order to improve on the following deficits Abnormal gait;Cardiopulmonary status limiting activity;Decreased activity tolerance;Decreased balance;Decreased endurance;Decreased skin integrity;Decreased strength;Difficulty walking;Impaired sensation;Postural dysfunction;Pain;Dizziness   Rehab Potential Fair   Clinical Impairments Affecting Rehab Potential complex medical history   PT Frequency 2x / week   PT Duration 8 weeks   PT Treatment/Interventions ADLs/Self Care Home Management;DME Instruction;Stair training;Gait training;Neuromuscular re-education;Functional mobility training;Therapeutic activities;Therapeutic exercise;Balance training;Orthotic Fit/Training;Patient/family education;Wheelchair mobility training;Vestibular   PT Next Visit Plan Wheelchair mobility (check SpO2) to assess ability to functionally self-propel w/c. Expand on HEP. Consider contacting [facility] to see if pt can see orthotist in-office sooner than 4/10 due to concerns with R ankle instability, limited ambulation at home due to R foot drop.    Consulted and Agree with Plan of Care Patient;Family member/caregiver   Family Member Consulted wife, [name]        Problem List Patient Active Problem List   Diagnosis Date Noted  . Sepsis ([place]) 04/01/2015  . CAP (community acquired pneumonia) 04/01/2015  . Cancer of lower lobe of right lung ([place])  04/01/2015  . Chronic respiratory failure with hypoxia ([place]) 04/01/2015  . Cough 03/08/2015  . Dysphagia 02/01/2015  . Primary cancer of right lower lobe of lung ([place]) 12/05/2014  . Lung mass   . Pressure sore on elbow 05/10/2014  . Abscess of buttock, right 04/12/2014  . Recurrent aspiration pneumonia ([place]) 11/16/2013  . Toenail fungus 11/06/2013  . HCAP (healthcare-associated pneumonia) 10/23/2013  . Hypotension 10/16/2013  . 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 CAD (coronary artery disease) 07/27/2013  . Thoracic compression fracture ([place]) 06/21/2013  . Thoracic spine fracture ([place]) 06/21/2013  . Giant comedone 11/04/2012  . Nausea with vomiting 07/24/2012  . Unspecified constipation 07/24/2012  . Abdominal pain, acute, epigastric 07/22/2012  . Hyponatremia 07/22/2012  . Chronic lower back pain 07/22/2012  . Transaminitis 07/22/2012  . Prostate cancer ([place]) 04/22/2012  . Routine general medical examination at a health care facility 04/22/2012  . Pseudomonas infection 11/24/2011  . E coli bacteremia 11/24/2011  . Postoperative infection 11/24/2011  . Anemia associated with acute blood loss   . Bacteremia due to Pseudomonas 10/04/2011  . Sepsis(995.91) 10/02/2011  . 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 Falls frequently 07/31/2011  . COPD  GOLD III with reversibility  01/14/2011  . Allergic rhinitis, seasonal 12/18/2010  . Angioedema 10/31/2010  . Neoplasm of uncertain behavior of skin 05/07/2010  . MYOCARDIAL INFARCTION 02/20/2010  . C V A / STROKE 02/20/2010  . CANDIDIASIS 02/13/2010  . Hyperlipidemia 02/13/2010  . Bipolar  disorder ([place]) 02/13/2010  . DEPRESSION 02/13/2010  . Essential hypertension 02/13/2010  . TREMOR 02/13/2010  . PROSTATE CANCER, HX OF 02/13/2010  . Hx of TIA (transient ischemic attack) and stroke 02/13/2010  . Chronic daily headache 02/13/2010   Billie Ruddy, PT, DPT [facility] [address] [place],  [place], [address] Phone: [phone]   Fax:  [phone] 05/14/2015, 8:44 PM  Name: JAXX HUISH MRN: [id number] Date of Birth: February 09, 1946

## 2015-05-15 ENCOUNTER — Telehealth: Payer: Self-pay | Admitting: Pulmonary Disease

## 2015-05-15 ENCOUNTER — Telehealth: Payer: Self-pay | Admitting: Interventional Cardiology

## 2015-05-15 NOTE — Telephone Encounter (Signed)
Spoke with pt's wife, states that pt has finished the prednisone taper given by TP on 3/22 ov, and is back down to baseline '10mg'$  qd.   Since finishing pred taper, pt c/o worsening chest congestion, body aches, chills, fatigue.   Denies prod cough, fever, chest pain.  Pt is also taking an otc congestion pill, and liquid mucinex DM prn. Requesting further recs.   Pt uses [facility] on [address].    Sending to DOD as BQ is night float this week.  VS please advise.  Thanks!

## 2015-05-15 NOTE — Telephone Encounter (Signed)
Have him increase his prednisone to 20 mg daily.

## 2015-05-15 NOTE — Telephone Encounter (Signed)
Please call him,as soon as you have time.This is concerning Leotis Pain.

## 2015-05-15 NOTE — Telephone Encounter (Signed)
Spoke with pt, aware of recs.  Pt states he has plenty of prednisone home at this time, no need to refill at this time.  I advised pt to contact us if he needs a refill sooner than anticipated.  Nothing further needed.

## 2015-05-15 NOTE — Telephone Encounter (Signed)
lmom for Dr.Plovsky with Dr.Smith's response below. Ritalin would be at increased risk for arrhythmia and ischemia. Dr.Plovsky is to call back if any questions

## 2015-05-16 ENCOUNTER — Ambulatory Visit: Payer: Medicare Other | Admitting: Family Medicine

## 2015-05-17 ENCOUNTER — Telehealth: Payer: Self-pay | Admitting: Physical Therapy

## 2015-05-17 ENCOUNTER — Telehealth: Payer: Self-pay | Admitting: Pulmonary Disease

## 2015-05-17 ENCOUNTER — Ambulatory Visit: Payer: Medicare Other | Admitting: Physical Therapy

## 2015-05-17 NOTE — Telephone Encounter (Signed)
We will attempt to order his AFO and a custom wheelchair evaluation as this would be very beneficial for pt

## 2015-05-17 NOTE — Telephone Encounter (Signed)
I am not able to call something in for him b/c he definitely needs an evaluation based on his shortness of breath despite increased prednisone.  I would recommend OV or ER for evaluation as he has a hx of becoming quite ill

## 2015-05-17 NOTE — Telephone Encounter (Signed)
Spoke with pt, states he is worsening despite being increased to '20mg'$  maintenance prednisone X2 days ago-  Pt c/o worsening weakness, fatigue, sob, chest congestion.   Denies fever, chest pain, prod cough. [facility] [address].    Sending to DOD as BQ is working nights this week.  CY please advise on recs.  Thanks!   Allergies  Allergen Reactions  . Lisinopril Swelling    ANGIOEDEMA  . Lamictal [Lamotrigine] Other (See Comments)    Weakness/difficulty swallowing  . Ambien [Zolpidem] Other (See Comments)    Unknown reaction   Current Outpatient Prescriptions on File Prior to Visit  Medication Sig Dispense Refill  . ARIPiprazole (ABILIFY) 10 MG tablet Take 5 mg by mouth daily.     . Ascorbic Acid (VITAMIN C) 1000 MG tablet Take 1,000 mg by mouth daily.    Marland Kitchen aspirin-acetaminophen-caffeine (EXCEDRIN MIGRAINE) 250-250-65 MG per tablet Take 2 tablets by mouth every 6 (six) hours as needed. For headaches    . budesonide (PULMICORT) 0.25 MG/2ML nebulizer solution Take 4 mLs (0.5 mg total) by nebulization 2 (two) times daily. 60 mL 3  . cetirizine (ZYRTEC) 10 MG tablet TAKE 1 TABLET BY MOUTH DAILY. 30 tablet 10  . cholecalciferol (VITAMIN D) 1000 UNITS tablet Take 1,000 Units by mouth every morning.     Marland Kitchen dextromethorphan-guaiFENesin (MUCINEX DM) 30-600 MG 12hr tablet Take 1 tablet by mouth 2 (two) times daily.    . diazepam (VALIUM) 5 MG tablet Take 5 mg by mouth 3 (three) times daily. scheduled    . divalproex (DEPAKOTE) 500 MG 24 hr tablet Take 1,000 mg by mouth at bedtime.     . docusate sodium (COLACE) 100 MG capsule Take 200 mg by mouth at bedtime.     . DULoxetine (CYMBALTA) 60 MG capsule Take 120 mg by mouth daily.     . fluticasone (FLONASE) 50 MCG/ACT nasal spray PLACE 2 SPRAYS INTO BOTH NOSTRILS DAILY. (Patient taking differently: PLACE 2 SPRAYS INTO BOTH NOSTRILS DAILY as needed for allergies) 16 g 2  . HYDROcodone-acetaminophen (NORCO) 10-325 MG per tablet Take 1 tablet  by mouth every 6 (six) hours as needed for moderate pain. for pain  0  . ipratropium-albuterol (DUONEB) 0.5-2.5 (3) MG/3ML SOLN Take 3 mLs by nebulization every 6 (six) hours. 360 mL 3  . losartan-hydrochlorothiazide (HYZAAR) 50-12.5 MG tablet TAKE 1 TABLET BY MOUTH DAILY. 30 tablet 3  . metoprolol succinate (TOPROL-XL) 50 MG 24 hr tablet TAKE 1 TABLET (50 MG TOTAL) BY MOUTH EVERY MORNING. TAKE WITH OR IMMEDIATELY FOLLOWING A MEAL. 30 tablet 6  . montelukast (SINGULAIR) 10 MG tablet TAKE 1 TABLET BY MOUTH AT BEDTIME 30 tablet 6  . NITROSTAT 0.4 MG SL tablet PLACE 1 TABLET (0.4 MG TOTAL) UNDER THE TONGUE EVERY 5 (FIVE) MINUTES AS NEEDED FOR CHEST PAIN. 25 tablet 1  . omeprazole (PRILOSEC) 40 MG capsule Take 40 mg by mouth 2 (two) times daily.     . polyethylene glycol (MIRALAX / GLYCOLAX) packet Take 17 g by mouth 3 (three) times daily as needed for moderate constipation. For constipation    . predniSONE (DELTASONE) 10 MG tablet 4 tabs for 2 days, then 3 tabs for 2 days, 2 tabs for 2 days, then 1 tab daily 60 tablet 1  . primidone (MYSOLINE) 250 MG tablet TAKE 1 TABLET (250 MG TOTAL) BY MOUTH 2 (TWO) TIMES DAILY. 60 tablet 6  . promethazine (PHENERGAN) 25 MG tablet Take 25 mg by mouth  every 6 (six) hours as needed. Reported on 04/06/2015  1  . simvastatin (ZOCOR) 40 MG tablet TAKE 1 TABLET (40 MG TOTAL) BY MOUTH EVERY EVENING. 90 tablet 1  . tiZANidine (ZANAFLEX) 4 MG tablet TAKE 1 TABLET BY MOUTH EVERY 8 HOURS AS NEEDED FOR MUSCLE SPASMS. 90 tablet 1  . Zinc 50 MG TABS Take 50 mg by mouth daily.      No current facility-administered medications on file prior to visit.

## 2015-05-17 NOTE — Telephone Encounter (Signed)
Called [place] and LM with Almyra Free at their office to have her return call to ensure orders are placed correctly.

## 2015-05-17 NOTE — Telephone Encounter (Signed)
Dr. [name],  I've been seeing Mr. Hincapie for PT at outpatient neurorehab. After completing the PT evaluation, I feel as though the patient would greatly benefit from a custom wheelchair, as he has been non-ambulatory for years and currently utilizes a standard wheelchair for all mobility. A custom wheelchair would improve positioning/alignment, increase patient independence with self-propulsion (which is currently limited by decreased SpO2), and would prevent contractures and skin breakdown.   The patient does also need to negotiate stairs to use primary home entrance. Moreover, he ambulates short distances to/from his bathroom, as his wheelchair doesn't fit through his bathroom door. The patient has right foot drop, which significantly increases his risk of falling while performing these aspects of mobility. The patient utilized a carbon fiber AFO on his right ankle during our first session, which improved gait stability markedly.  If you agree, please place orders for a right AFO and for a custom wheelchair evaluation. Please don't hesitate to contact me with any further questions. Thanks so much for your help with this.  Billie Ruddy, PT, DPT [facility] [address] [place], [place], [address] Phone: [phone]   Fax:  [phone] 05/17/2015, 8:35 AM

## 2015-05-17 NOTE — Telephone Encounter (Signed)
Spoke with Malik Zuniga and DME's placed for AFO and wheelchair evaluation. AFO faxed to [facility] at [phone]

## 2015-05-17 NOTE — Telephone Encounter (Addendum)
Called and spoke to pt. Informed him of the recs per CY and Dr. [name]. Pt verbalized understanding and states he does not want to go to ED today but if symptoms persist then he will go to ED tomorrow.   Will send to Dr. Sherrin Daisy as Juluis Rainier.

## 2015-05-17 NOTE — Telephone Encounter (Signed)
Patient called office and wanted to get advice from Dr [name], advised she was out of office but he states he was diagnosed with pneumonia approx a month ago and is having a bad day, states that he is short of breath, fatigued and congested. He does have a call into Pulmonology but he states that they have not gotten back to him yet. I advised patient that with his symptoms he should go to ER.

## 2015-05-17 NOTE — Telephone Encounter (Signed)
Agree with Dr Virgil Benedict recommendation that he go to ER for evaluation.

## 2015-05-17 NOTE — Addendum Note (Signed)
Addended by: Davis Gourd on: 05/17/2015 04:16 PM   Modules accepted: Orders

## 2015-05-17 NOTE — Telephone Encounter (Signed)
Entry made in error

## 2015-05-17 NOTE — Telephone Encounter (Signed)
Called and advised patient wife on both providers notification to go to the ER. Patients wife advised that patient is sleeping right now but if he is still having SOB when he wakes up they will go to ER.

## 2015-05-18 ENCOUNTER — Ambulatory Visit: Payer: Medicare Other | Admitting: Physical Therapy

## 2015-05-21 ENCOUNTER — Ambulatory Visit: Payer: Medicare Other | Admitting: Physical Therapy

## 2015-05-21 VITALS — HR 101

## 2015-05-21 DIAGNOSIS — R2681 Unsteadiness on feet: Secondary | ICD-10-CM | POA: Diagnosis not present

## 2015-05-21 DIAGNOSIS — R2689 Other abnormalities of gait and mobility: Secondary | ICD-10-CM

## 2015-05-21 DIAGNOSIS — M6281 Muscle weakness (generalized): Secondary | ICD-10-CM | POA: Diagnosis not present

## 2015-05-21 DIAGNOSIS — M21371 Foot drop, right foot: Secondary | ICD-10-CM | POA: Diagnosis not present

## 2015-05-21 DIAGNOSIS — R293 Abnormal posture: Secondary | ICD-10-CM | POA: Diagnosis not present

## 2015-05-21 NOTE — Patient Instructions (Signed)
(  Home) Squat: (Assist)    Stand in front of your (locked) wheelchair with both hands on your rolling walker. Squat as though you're about to sit in the chair behind you (but don't actually sit).  *Make sure you are right in front of your wheelchair, just in case your knee(s) buckle. Monitor your oxygen throughout to ensure oxygen saturation doesn't drop below 90%. If it does, stop and rest until oxygen is at least 90%.  Perform 5-7 reps, 4 times per day.

## 2015-05-22 NOTE — Therapy (Signed)
[facility] [address] [place], [place], [address] Phone: [phone]   Fax:  [phone]  Physical Therapy Treatment  Patient Details  Name: [facility] MRN: [id number] Date of Birth: [birth year] Referring Provider: Simonne Maffucci, MD  Encounter Date: 05/21/2015      PT End of Session - 05/22/15 0554    Visit Number 4   Number of Visits 17   Date for PT Re-Evaluation 07/06/15   Authorization Type Medicare primary; BCBS secondary - G Codes and PN required every 10 visits   PT Start Time 0848   PT Stop Time 0933   PT Time Calculation (min) 45 min   Equipment Utilized During Treatment Gait belt   Activity Tolerance Patient limited by fatigue;Other (comment)  SpO2 as low as 85% with activity   Behavior During Therapy [facility] for tasks assessed/performed      Past Medical History  Diagnosis Date  . Hyperlipidemia     takes [facility] daily  . Tremor   . History of prostate cancer   . On home O2   . Neuromuscular disorder ([facility]) 1998    right carpal tunnel release  . Anemia associated with acute blood loss   . GERD (gastroesophageal reflux disease)     takes Omeprazole daily  . Hypertension     takes Metoprolol daily as well as Hyzaar  . Constipation     takes Colace daily as well as [facility]  . Anxiety     takes [facility] daily  . Depression     takes [facility] daily  . Emphysema of lung (HCC)     Albuterol as needed;Symbicort daily and Singulair at bedtime  . Emphysema   . Myocardial infarction ([facility]) 04/1998  . Coronary artery disease   . Asthma   . Shortness of breath     with exertion  . History of bronchitis   . Aspiration pneumonia ([facility]) 2010  . Headache, chronic daily   . History of Zuniga     last Zuniga a couple of days ago;takes [facility]  . Chronic back pain     compression fracture  . History of colon polyps   . Mood change ([facility])     after Brain surgery mood changed and was  placed on Mal[facility] Zuniga  . Insomnia     takes Benadryl nightly  . 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 Prostate cancer ([facility]) 2004  . Lung cancer [facility])     Past Surgical History  Procedure Laterality Date  . Cholecystectomy  1996  . 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 Annette Zuniga  . Vascular stent  2000    Dr. Rollene [facility]; he reports cardiac stent, but denies stent for PAD 06/15/13  . Hand surgery  1989    crushed thumb, Dr. Fredna Zuniga  . Ulnar nerve repair  1998    left arm, Dr. Fredna Zuniga  . Gastrostomy w/ feeding tube  2008    Dr. Watt Zuniga; only had for a few months  . Prostatectomy  04/2001    removal of prostate cancer, Dr. Janice Zuniga  . Coronary angioplasty with stent placement  04/1998  . Carpal tunnel release  1998    right  . Spine surgery    . 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 Back surgery  08/2004; 02/2005; 04/2006; 06/2007; 7/20101    all by Dr. Annette Zuniga  . Esophagogastroduodenoscopy N/A 07/23/2012    Procedure: ESOPHAGOGASTRODUODENOSCOPY (EGD);  Surgeon: Malik Columbia, MD;  Location: [facility];  Service: Endoscopy;  Laterality: N/A;  buccini /ja  . Colonoscopy    . Back surgery  05/2013  . Dg biopsy lung      Filed Vitals:   05/21/15 0855 05/21/15 0902 05/21/15 0920 05/21/15 0924  Pulse: 97 97 99 101  SpO2: 92% 85% 91% 90%        Subjective Assessment - 05/21/15 0855    Subjective "My pneumonia flared up last week for a few days. I didn't end up going to the hospital because [facility] didn't have any beds and there was a 3-hour wait in the ED."   Patient is accompained by: Family member  wife, [facility]   Pertinent History * Monitor SpO2 closely *   PMH: PNA, lung CA (diagnosed 11/2014; currently no radiation or chemo), COPD, CVA (1998),  aneurysm clipping (1999), kyphoscoliosis, thoracolumbar fusion x8 levels, HTN, HLD, prostate cancer (2004), MI (2000), depression, anxiety, bipolar disorder   Patient Stated Goals "I'm here to get stronger, to improve my endurance."      Currently in  Pain? Yes   Pain Score 3    Pain Location Back   Pain Orientation Lower   Pain Descriptors / Indicators Aching   Pain Type Chronic pain   Pain Onset More than a month ago   Pain Frequency Constant   Aggravating Factors  standing and walking    Pain Relieving Factors pain medication   Effect of Pain on Daily Activities limits standing and walking tolerance   Multiple Pain Sites No                         [facility] Adult PT Treatment/Exercise - 05/22/15 0001    Transfers   Transfers Sit to Stand;Stand to Sit   Sit to Stand 6: Modified independent (Device/Increase time)  with RW   Stand to Sit 5: Supervision  with RW   Stand to Sit Details Initially required verbal reminder for hand placement with effective within-session carryover.   Ambulation/Gait   Ambulation/Gait Yes   Ambulation/Gait Assistance 5: Supervision;4: Min guard   Ambulation/Gait Assistance Details (S) for linear gait; min guard for turning   Ambulation Distance (Feet) 11 Feet  then 21'   Assistive device Rolling walker;Other (Comment)  R [place] AFO then R Reaction AFO   Gait Pattern Decreased step length - left;Decreased stride length;Decreased stance time - right;Decreased hip/knee flexion - right;Decreased hip/knee flexion - left;Decreased weight shift to right;Right flexed knee in stance;Left flexed knee in stance;Trunk flexed;Wide base of support;Step-through pattern;Step-to pattern   Ambulation Surface Level;Indoor   Chief Technology Officer Yes   Wheelchair Assistance 5: Supervision  for safety with turning in narrow [place] Both upper extremities   Wheelchair [employer] Management Needs assistance   Distance 70   Comments Self-propulsion of manual w/c x40' (linear, no turns) in 26.94 sec and ended due to labored breathing; SpO2 92%. Self-propulsion of w/c with 2 turns x70' (pt requesting break at this time due to fatigue, SOB) in 62 sec with SpO2 91%.    Posture/Postural Control   Posture/Postural Control Postural limitations   Postural Limitations Rounded Shoulders;Forward head;Increased thoracic kyphosis;Posterior pelvic tilt;Weight shift left   Exercises   Exercises Other Exercises   Other Exercises  Standing in front of (locked) w/c with BUE support at RW, pt performed squats 2 x5 reps (to pt fatigue) with min cueing  for technique.                PT Education - 05/22/15 0542    Education provided Yes   Education Details Progressed HEP; see Pt Instructions.  Pt, wife, orthotist, and PT in agreement on pt getting R Reaction AFO to maximize safety with functional mobility.   Person(s) Educated Patient;Spouse   Methods Explanation;Demonstration;Verbal cues;Handout   Comprehension Verbalized understanding;Returned demonstration          PT Short Term Goals - 05/22/15 0555    PT SHORT TERM GOAL #1   Title Pt will independently perform HEP to maximize functional gains made in PT.    (Target date: 06/04/15)   Baseline Met 4/10.   Status Achieved   PT SHORT TERM GOAL #2   Title Orthotist will be present for PT session to assess/address if pt is appropriate candidate for R AFO to improve safety with short-distance household ambulation.   (Target date: 06/04/15)   Baseline Met 4/10.   Status Achieved   PT SHORT TERM GOAL #3   Title Schedule consult for custom w/c to improve positioning, postural alignment, contracture prevention, preservation of skin integrity, and improved breath support.    (Target date: 06/04/15)   Baseline Scheduled for 4/18.   Status Achieved   PT SHORT TERM GOAL #4   Title Pt will perform sit > stand from w/c with min A using LRAD to indicate increased safety with functional transfers.    (Target date: 06/04/15)   Baseline Met 4/3.   Status Achieved   PT SHORT TERM GOAL #5   Title Pt will ambulate x20' with Min A using LRAD with SpO2 > 89% to indicate improved activity tolerance.    (Target date: 06/04/15)    Baseline Met 4/3.   Status Achieved   PT SHORT TERM GOAL #6   Title Pt will negotiate 6 stairs with single R rail to with Min A with SpO2 > 89% to indicate increased pt safety using primary home entrance.  (Target date: 06/04/15)   Status On-going           PT Long Term Goals - 05/12/15 1935    PT LONG TERM GOAL #1   Title Pt will perform sit > stand from w/c with supervision using LRAD to indicate increased safety with functional transfers.    (Target date: 07/02/15)   Status On-going   PT LONG TERM GOAL #2   Title Pt will ambulate 46' with supervision using LRAD with SpO2 > 89% to indicate increased safety with household ambulation.      (Target date: 07/02/15)   Status On-going   PT LONG TERM GOAL #3   Title Pt will negotiate 6 stairs with single R rail to with Min A with SpO2 > 89% to indicate increased pt safety using primary home entrance.    (Target date: 07/02/15)   Status On-going   PT LONG TERM GOAL #4   Title Pt will perform self-propulsion of w/c > 150' with mod I and SpO2 > 89% to indicate increased independence with w/c mobility.  (Target date: 07/02/15)   Status On-going               Plan - 05/22/15 0555    Clinical Impression Statement Orthotist present for this session to help facilitate pt getting AFO. After gait trial with R Reaction AFO, patient, wife, orthotist, and PT all in agreement that Reaction AFO is best option for maximizing safety and energy efficiency  with gait. Remainder of session focused on assessing pt ability to self-propel w/c to prepare for upcoming assessment for new w/c. Pt able to self-propel manual w/c x70' with B UE's prior to needing rest break due to fatigue (SpO2 91%).    Rehab Potential Fair   Clinical Impairments Affecting Rehab Potential complex medical history   PT Frequency 2x / week   PT Duration 8 weeks   PT Treatment/Interventions ADLs/Self Care Home Management;DME Instruction;Stair training;Gait training;Neuromuscular  re-education;Functional mobility training;Therapeutic activities;Therapeutic exercise;Balance training;Orthotic Fit/Training;Patient/family education;Wheelchair mobility training;Vestibular   PT Next Visit Plan Use R Reaction AFO during session. Consider progressing goals, as pt has already met majority of STG's.  Paced breathing with gait. See if pt able to lock w/c braces and safely remove w/c leg rests. Expand on HEP.   Recommended Other Services Orthotist to deliver R Reaction AFO to PT session on 4/24 or 4/26.   Consulted and Agree with Plan of Care Patient;Family member/caregiver   Family Member Consulted wife, [facility]      Patient will benefit from skilled therapeutic intervention in order to improve the following deficits and impairments:  Abnormal gait, Cardiopulmonary status limiting activity, Decreased activity tolerance, Decreased balance, Decreased endurance, Decreased skin integrity, Decreased strength, Difficulty walking, Impaired sensation, Postural dysfunction, Pain, Dizziness  Visit Diagnosis: Other abnormalities of gait and mobility  Unsteadiness on feet  Foot drop, right  Muscle weakness (generalized)     Problem List Patient Active Problem List   Diagnosis Date Noted  . Sepsis ([place]) 04/01/2015  . CAP (community acquired pneumonia) 04/01/2015  . Cancer of lower lobe of right lung ([place]) 04/01/2015  . Chronic respiratory failure with hypoxia ([place]) 04/01/2015  . Cough 03/08/2015  . Dysphagia 02/01/2015  . Primary cancer of right lower lobe of lung ([place]) 12/05/2014  . Lung mass   . Pressure sore on elbow 05/10/2014  . Abscess of buttock, right 04/12/2014  . Recurrent aspiration pneumonia ([place]) 11/16/2013  . Toenail fungus 11/06/2013  . HCAP (healthcare-associated pneumonia) 10/23/2013  . Hypotension 10/16/2013  . 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 CAD (coronary artery disease) 07/27/2013  . Thoracic compression fracture ([place]) 06/21/2013  . Thoracic spine  fracture ([place]) 06/21/2013  . Giant comedone 11/04/2012  . Nausea with vomiting 07/24/2012  . Unspecified constipation 07/24/2012  . Abdominal pain, acute, epigastric 07/22/2012  . Hyponatremia 07/22/2012  . Chronic lower back pain 07/22/2012  . Transaminitis 07/22/2012  . Prostate cancer ([place]) 04/22/2012  . Routine general medical examination at a health care facility 04/22/2012  . Pseudomonas infection 11/24/2011  . E coli bacteremia 11/24/2011  . Postoperative infection 11/24/2011  . Anemia associated with acute blood loss   . Bacteremia due to Pseudomonas 10/04/2011  . Sepsis(995.91) 10/02/2011  . 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 Falls frequently 07/31/2011  . COPD  GOLD III with reversibility  01/14/2011  . Allergic rhinitis, seasonal 12/18/2010  . Angioedema 10/31/2010  . Neoplasm of uncertain behavior of skin 05/07/2010  . MYOCARDIAL INFARCTION 02/20/2010  . C V A / STROKE 02/20/2010  . CANDIDIASIS 02/13/2010  . Hyperlipidemia 02/13/2010  . Bipolar disorder ([place]) 02/13/2010  . DEPRESSION 02/13/2010  . Essential hypertension 02/13/2010  . TREMOR 02/13/2010  . PROSTATE CANCER, HX OF 02/13/2010  . Hx of TIA (transient ischemic attack) and stroke 02/13/2010  . Chronic daily headache 02/13/2010    Billie Ruddy, PT, DPT [facility] [address] [place], [place], [address] Phone: [phone]   Fax:  [phone] 05/22/2015, 6:03  AM  Name: [facility] MRN: [id number] Date of Birth: January 02, 1946

## 2015-05-29 ENCOUNTER — Ambulatory Visit: Payer: Medicare Other | Admitting: Physical Therapy

## 2015-05-29 DIAGNOSIS — M6281 Muscle weakness (generalized): Secondary | ICD-10-CM | POA: Diagnosis not present

## 2015-05-29 DIAGNOSIS — R2681 Unsteadiness on feet: Secondary | ICD-10-CM | POA: Diagnosis not present

## 2015-05-29 DIAGNOSIS — M21371 Foot drop, right foot: Secondary | ICD-10-CM | POA: Diagnosis not present

## 2015-05-29 DIAGNOSIS — R293 Abnormal posture: Secondary | ICD-10-CM | POA: Diagnosis not present

## 2015-05-29 DIAGNOSIS — R2689 Other abnormalities of gait and mobility: Secondary | ICD-10-CM

## 2015-05-29 NOTE — Therapy (Signed)
[facility] [address] [place], [place], [address] Phone: [phone]   Fax:  [phone]  Physical Therapy Treatment  Patient Details  Name: Malik Zuniga MRN: [id number] Date of Birth: [birth year] Referring Provider: Simonne Maffucci, MD  Encounter Date: 05/29/2015      PT End of Session - 05/29/15 1222    Visit Number 5   Number of Visits 17   Date for PT Re-Evaluation 07/06/15   Authorization Type Medicare primary; BCBS secondary - G Codes and PN required every 10 visits   PT Start Time 1100   PT Stop Time 1202   PT Time Calculation (min) 62 min   Activity Tolerance Patient tolerated treatment well   Behavior During Therapy [facility] for tasks assessed/performed      Past Medical History  Diagnosis Date  . Hyperlipidemia     takes Simvastatin daily  . Tremor   . History of prostate cancer   . On home O2   . Neuromuscular disorder ([place]) 1998    right carpal tunnel release  . Anemia associated with acute blood loss   . GERD (gastroesophageal reflux disease)     takes Omeprazole daily  . Hypertension     takes Metoprolol daily as well as Hyzaar  . Constipation     takes Colace daily as well as Miralax  . Anxiety     takes Valium daily  . Depression     takes Cymbalta daily  . Emphysema of lung ([facility])     Albuterol as needed;Symbicort daily and Singulair at bedtime  . Emphysema   . Myocardial infarction ([place]) 04/1998  . Coronary artery disease   . Asthma   . Shortness of breath     with exertion  . History of bronchitis   . Aspiration pneumonia ([place]) 2010  . Headache, chronic daily   . History of migraine     last migraine a couple of days ago;takes Excedrin Migraine  . Chronic back pain     compression fracture  . History of colon polyps   . Mood change ([place])     after Brain surgery mood changed and was placed on Depakote  . Insomnia     takes Benadryl nightly  . 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 Prostate cancer ([place]) 2004  . Lung cancer [facility])     Past Surgical History  Procedure Laterality Date  . Cholecystectomy  1996  . 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 Annette Stable  . Vascular stent  2000    Dr. Rollene Fare; he reports cardiac stent, but denies stent for PAD 06/15/13  . Hand surgery  1989    crushed thumb, Dr. Fredna Dow  . Ulnar nerve repair  1998    left arm, Dr. Fredna Dow  . Gastrostomy w/ feeding tube  2008    Dr. Watt Climes; only had for a few months  . Prostatectomy  04/2001    removal of prostate cancer, Dr. Janice Norrie  . Coronary angioplasty with stent placement  04/1998  . Carpal tunnel release  1998    right  . Spine surgery    . 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 Back surgery  08/2004; 02/2005; 04/2006; 06/2007; 7/20101    all by Dr. Annette Stable  . Esophagogastroduodenoscopy N/A 07/23/2012    Procedure: ESOPHAGOGASTRODUODENOSCOPY (EGD);  Surgeon: Jeryl Columbia, MD;  Location: [facility] ENDOSCOPY;  Service: Endoscopy;  Laterality: N/A;  buccini /ja  . Colonoscopy    . Back surgery  05/2013  . Dg biopsy lung      There were no vitals filed for this visit.    Mobility/Seating Evaluation    PATIENT INFORMATION: Name: Malik Zuniga, Malik Zuniga DOB: 01-13-1946  Sex: M Date seen: 05/29/15 Time: 11:00   Address:  [address] [place], [place] [address] Physician: [name], MD This evaluation/justification form will serve as the LMN for the following suppliers: __________________________ Supplier: [employer] Contact Person: Luz Brazen, ATP Phone:  [phone]   Seating Therapist: Billie Ruddy, PT, DPT Phone:   [phone]   Phone: [phone]     Spouse/Parent/Caregiver name: [name]  Phone number: [phone] Insurance/Payer: Medicare Traditional Primary; BCBS secondary     Reason for Referral: new wheelchair  Patient/Caregiver Goals: lighter wheelchair for easier propulsion and transport  Patient was seen for face-to-face evaluation for new  manual wheelchair.  Also present was [employer], [employer] to discuss recommendations and wheelchair options.  Further paperwork was completed and sent to vendor.  Patient appears to qualify for manual mobility device at this time per objective findings.   MEDICAL HISTORY: Diagnosis: Primary Diagnosis: COPD, Falls frequently Hx of TIA (transient ischemic attack) and stroke   Onset: 2001 Diagnosis:     '[]'$ Progressive Disease Relevant past and future surgeries: craniotomy for aneurysm clipping (1999), multiple back surgeries in7/2006; 02/2005; 04/2006; 06/2007; 08/2008. 4-level posterior lumbar fusion (09/29/2011); T8-9 removal of hardware and augmentation of fusion (5/12/12015)    Height: 5'10 Weight: 160 Explain recent changes or trends in weight: Has lost weight since diagnosis with lung cancer in 11/2014   History including Falls: Wheelchair has been primary means of functional mobility for past 5 years. Diagnosed with lung cancer in 11/2014; has had pneumonia since 11/2014. Has fallen 3-4 times since DC from hospital on 04/04/15. CVA (1998),  aneurysm clipping (1999), kyphoscoliosis, thoracolumbar fusion x8 levels, HTN, HLD, prostate cancer (2004), MI (2000), depression, anxiety, bipolar disorder     HOME ENVIRONMENT: '[x]'$ House  '[]'$ Condo/town home  '[]'$ Apartment  '[]'$ Assisted Living    '[]'$ Lives Alone '[x]'$  Lives with Others                                                                                          Hours with caregiver: ?????  '[x]'$ Home is accessible to patient           Stairs      '[x]'$ Yes '[]'$  No     Ramp '[]'$ Yes '[x]'$ No Comments:  Pt negotiates 5 steps to enter home, single rail, with min guard of wife. Short-distance (< 20') with rolling walker and R AFO. Have been working on short-distance mobility and stairs in PT to facilitate safety in home environment.    COMMUNITY ADL: TRANSPORTATION: '[x]'$ Car    '[]'$ Van    '[]'$ Public Transportation    '[]'$ Adapted w/c Lift    '[]'$ Ambulance    '[]'$ Other:       '[]'$ Sits in  wheelchair during transport  Employment/School: Retired Specific requirements pertaining to mobility N/A  Other: ?????    FUNCTIONAL/SENSORY PROCESSING SKILLS:  Handedness:   '[x]'$ Right     '[]'$   Left    '[]'$ NA  Comments:  ?????  Functional Processing Skills for Wheeled Mobility '[x]'$ Processing Skills are adequate for safe wheelchair operation  Areas of concern than may interfere with safe operation of wheelchair Description of problem   '[]'$  Attention to environment      '[]'$ Judgment      '[]'$  Hearing  '[]'$  Vision or visual processing      '[]'$ Motor Planning  '[]'$  Fluctuations in Behavior  ?????    VERBAL COMMUNICATION: '[x]'$ WFL receptive '[x]'$  WFL expressive '[x]'$ Understandable  '[]'$ Difficult to understand  '[]'$ non-communicative '[]'$  Uses an augmented communication device  CURRENT SEATING / MOBILITY: Current Mobility Base:  '[]'$ None '[]'$ Dependent '[x]'$ Manual '[]'$ Scooter '[]'$ Power  Type of Control: ?????  Manufacturer:  Breezy Ultra 4 Size:  18" x18" Age: 10 years  Current Condition of Mobility Base:  fair   Current Wheelchair components:  flip back arms; brake extension handles, heel loops, general use cushion  Describe posture in present seating system:  ?????      SENSATION and SKIN ISSUES: Sensation '[]'$ Intact  '[x]'$ Impaired '[]'$ Absent  Level of sensation: RLE numbness associated with multiple spinal surgeries Pressure Relief: Able to perform effective pressure relief :    '[x]'$ Yes  '[]'$  No Method: Upper extremity boosting If not, Why?: Performance varies depending on fatigue and SpO2  Skin Issues/Skin Integrity Current Skin Issues  '[]'$ Yes '[x]'$ No '[x]'$ Intact '[]'$  Red area'[]'$  Open Area  '[]'$ Scar Tissue '[x]'$ At risk from prolonged sitting Where  ischial tuberosities and sacrum  History of Skin Issues  '[x]'$ Yes '[]'$ No Where  R ischial tuberosity and sacral When  2013  Hx of skin flap surgeries  '[]'$ Yes '[x]'$ No Where  NA When  NA  Limited sitting tolerance '[x]'$ Yes '[]'$ No Hours spent sitting in wheelchair daily: 2-3 hours  Complaint of Pain:   Please describe: lower back   Swelling/Edema: R LE edema   ADL STATUS (in reference to wheelchair use):  Indep Assist Unable Indep with Equip Not assessed Comments  Dressing ????? X ????? ????? ????? ?????  Eating X ????? ????? ????? ????? ?????  Toileting ????? ????? ????? X ????? has fallen twice in bathroom since DC from hospital on 04/04/15  Bathing ????? X ????? X ????? uses  bench and rails in tub and on wall; wife assists  Grooming/Hygiene ????? ????? ????? X ????? seated on rollator walker  Meal Prep ????? ????? X ????? ????? ?????  IADLS ????? ????? X ????? ????? ?????  Bowel Management: '[x]'$ Continent  '[]'$ Incontinent  '[]'$ Accidents Comments:  ?????  Bladder Management: '[]'$ Continent  '[]'$ Incontinent  '[x]'$ Accidents Comments:  ?????     WHEELCHAIR SKILLS: Manual w/c Propulsion: '[x]'$ UE or LE strength and endurance sufficient to participate in ADLs using manual wheelchair Arm : '[]'$ left '[]'$ right   '[x]'$ Both      Distance: 70 Foot:  '[]'$ left '[]'$ right   '[]'$ Both  Operate Scooter: '[]'$  Strength, hand grip, balance and transfer appropriate for use '[]'$ Living environment is accessible for use of scooter  Operate Power w/c:  '[]'$  Std. Joystick   '[]'$  Alternative Controls Indep '[]'$  Assist '[]'$  Dependent/unable '[]'$  N/A '[]'$   '[]'$ Safe          '[]'$  Functional      Distance: ?????  Bed confined without wheelchair '[]'$  Yes '[x]'$  No   STRENGTH/RANGE OF MOTION:  Active Range of Motion Strength  Shoulder Grossly 105 degrees flexion, bilaterally 3-/5 bilaterally  Elbow WFL 4/5 R, 4+/5 L  Wrist/Hand WFL bilaterally Wrist flex/ext 4/5 on R, 4+/5 on L. Grip 4/5 on R, WFL on L  Hip  R hip flexion limited to grossly 30 degrees by weakness L hip flexion WNL. Bilateral hip extension (active and passive) limited to grossly-5 degrees due limited muscle length.  2-/5 on R; 4-/5 on L  Knee R knee flexion WFL; R knee extension AROM limited to -10 degrees by weakness; extension PROM limited to -3 degrees by decreased hamstring length. L knee  flexion/extension grossly WFL. R knee flexion/extension 4-/5 L knee flexion/extension 4+/5,  Ankle R ankle plantarflexion, dorsiflexion limited (to neutral) by weakness; L ankle AROM grossly WFL R ankle dorsiflexion 0/5, plantarflexion 1/5 L ankle dorsiflexion 4-/5, plantarflexion 4-/5      MOBILITY/BALANCE:  '[]'$  Patient is totally dependent for mobility  ?????    Balance Transfers Ambulation  Sitting Balance: Standing Balance: '[]'$  Independent '[]'$  Independent/Modified Independent  '[x]'$  WFL     '[]'$  WFL '[]'$  Supervision '[]'$  Supervision  '[]'$  Uses UE for balance  '[]'$  Supervision '[x]'$  Min Assist '[x]'$  Ambulates with Assist  Supervision for linear gait up to 21'; min A for turning and obstacle negotiation.    '[]'$  Min Assist '[x]'$  Min assist '[]'$  Mod Assist '[]'$  Ambulates with Device:      '[x]'$  RW  '[]'$  StW  '[]'$  Cane  '[]'$  ?????  '[]'$  Mod Assist '[]'$  Mod assist '[]'$  Max assist   '[]'$  Max Assist '[]'$  Max assist '[]'$  Dependent '[]'$  Indep. Short Distance Only  '[]'$  Unable '[]'$  Unable '[]'$  Lift / Sling Required Distance (in feet)  20'   '[]'$  Sliding board '[]'$  Unable to Ambulate (see explanation below)  Cardio Status:  '[x]'$ Intact  '[]'$  Impaired   '[]'$  NA     ?????  Respiratory Status:  '[]'$ Intact   '[x]'$ Impaired   '[]'$ NA     SpO2 decreases quickly due to the fact that patient has lung cancer and has had pneumonia since 11/2014.  Orthotics/Prosthetics: Right ankle-foot orthotic to correct R foot drop.  Comments (Address manual vs power w/c vs scooter): Patient and wife unable to afford Lucianne Lei to transport chair         Anterior / Posterior Obliquity Rotation-Pelvis spine position fixed due to history of multi-level thoracolumbar fusion (see above).   PELVIS    '[]'$  '[x]'$  '[]'$   Neutral Posterior Anterior  '[]'$  '[x]'$  '[]'$   WFL Rt elev Lt elev  '[]'$  '[]'$  '[x]'$   WFL Right Left                      Anterior    Anterior     '[]'$  Fixed '[]'$  Other '[x]'$  Partly Flexible '[]'$  Flexible   '[]'$  Fixed '[]'$  Other '[x]'$  Partly Flexible  '[]'$  Flexible  '[]'$  Fixed '[]'$  Other '[x]'$  Partly Flexible  '[]'$   Flexible   TRUNK  '[]'$  '[x]'$  '[]'$   WFL ? Thoracic ? Lumbar  Kyphosis Lordosis  '[]'$  '[]'$  '[x]'$   WFL Convex Convex  Right Left '[x]'$ c-curve '[]'$ s-curve '[]'$ multiple  '[]'$  Neutral '[]'$  Left-anterior '[]'$  Right-anterior     '[x]'$  Fixed '[]'$  Flexible '[]'$  Partly Flexible '[]'$  Other  '[x]'$  Fixed '[]'$  Flexible '[]'$  Partly Flexible '[]'$  Other  '[]'$  Fixed             '[]'$  Flexible '[]'$  Partly Flexible '[]'$  Other    Position Windswept  L hip slightly ABDucted/externally rotated; R hip slightly internally rotated due to current seatrng components of w/c. .  HIPS          '[]'$            '[x]'$               '[]'$   Neutral       Abduct        ADduct         '[]'$           '[x]'$            '[]'$   Neutral Right           Left      '[]'$  Fixed '[]'$  Subluxed '[x]'$  Partly Flexible '[]'$  Dislocated '[]'$  Flexible  '[]'$  Fixed '[]'$  Other '[]'$  Partly Flexible  '[x]'$  Flexible                 Foot Positioning Knee Positioning  ?????    '[x]'$  WFL  '[x]'$ Lt '[x]'$ Rt '[x]'$  WFL  '[x]'$ Lt '[x]'$ Rt    KNEES ROM concerns: ROM concerns:    & Dorsi-Flexed '[]'$ Lt '[]'$ Rt N/A    FEET Plantar Flexed '[]'$ Lt '[]'$ Rt      Inversion                 '[]'$ Lt '[]'$ Rt      Eversion                 '[]'$ Lt '[]'$ Rt     HEAD '[]'$  Functional '[]'$  Good Head Control  ?????  & '[x]'$  Flexed         '[]'$  Extended '[]'$  Adequate Head Control    NECK '[]'$  Rotated  Lt  '[]'$  Lat Flexed Lt '[x]'$  Rotated  Rt '[x]'$  Lat Flexed Rt '[x]'$  Limited Head Control     '[]'$  Cervical Hyperextension '[]'$  Absent  Head Control     SHOULDERS ELBOWS WRIST& HAND In seated/resting, B scapulae protracted. Able to perform B shoulder flexion > 90 degrees with minimal pain.      Left     Right    Left     Right    Left     Right   U/E '[x]'$ Functional           '[x]'$ Functional WFL WFL '[]'$ Fisting             '[]'$ Fisting      '[]'$ elev   '[]'$ dep      '[]'$ elev   '[]'$ dep       '[x]'$ pro -'[]'$ retract     '[x]'$ pro  '[]'$ retract '[]'$ subluxed             '[]'$ subluxed           Goals for Wheelchair Mobility  '[x]'$  Independence with mobility in the home with motor related ADLs (MRADLs)  '[x]'$  Independence with MRADLs in the  community '[]'$  Provide dependent mobility  '[]'$  Provide recline     '[]'$ Provide tilt   Goals for Seating system '[x]'$  Optimize pressure distribution '[x]'$  Provide support needed to facilitate function or safety '[]'$  Provide corrective forces to assist with maintaining or improving posture '[x]'$  Accommodate client's posture:   current seated postures and positions are not flexible or will not tolerate corrective forces '[x]'$  Client to be independent with relieving pressure in the wheelchair '[x]'$ Enhance physiological function such as breathing, swallowing, digestion  Simulation ideas/Equipment trials:????? State why other equipment was unsuccessful:?????   MOBILITY BASE RECOMMENDATIONS and JUSTIFICATION: MOBILITY COMPONENT JUSTIFICATION  Manufacturer: [employer] Mobility Model: Catalyst 5   Size: Width 18"Seat Depth 18" '[x]'$ provide transport from point A to B      '[x]'$ promote Indep mobility  '[x]'$ is not a safe, functional ambulator '[x]'$ walker or cane inadequate '[]'$ non-standard width/depth necessary to accommodate anatomical measurement '[]'$  ?????  '[x]'$ Manual Mobility Base '[x]'$ non-functional ambulator    '[]'$ Scooter/POV  '[]'$ can safely operate  '[]'$ can safely transfer   '[]'$   has adequate trunk stability  '[]'$ cannot functionally propel manual w/c  '[]'$ Power Mobility Base  '[]'$ non-ambulatory  '[]'$ cannot functionally propel manual wheelchair  '[]'$  cannot functionally and safely operate scooter/POV '[]'$ can safely operate and willing to  '[]'$ Stroller Base '[]'$ infant/child  '[]'$ unable to propel manual wheelchair '[]'$ allows for growth '[]'$ non-functional ambulator '[]'$ non-functional UE '[]'$ Indep mobility is not a goal at this time  '[]'$ Tilt  '[]'$ Forward '[]'$ Backward '[]'$ Powered tilt  '[]'$ Manual tilt  '[]'$ change position against gravitational force on head and shoulders  '[]'$ change position for pressure relief/cannot weight shift '[]'$ transfers  '[]'$ management of tone '[]'$ rest periods '[]'$ control edema '[]'$ facilitate postural control  '[]'$  ?????  '[]'$ Recline  '[]'$ Power recline on  power base '[]'$ Manual recline on manual base  '[]'$ accommodate femur to back angle  '[]'$ bring to full recline for ADL care  '[]'$ change position for pressure relief/cannot weight shift '[]'$ rest periods '[]'$ repositioning for transfers or clothing/diaper /catheter changes '[]'$ head positioning  '[x]'$ Lighter weight required '[x]'$ self- propulsion  '[x]'$ lifting '[]'$  ?????  '[]'$ Heavy Duty required '[]'$ user weight greater than 250# '[]'$ extreme tone/ over active movement '[]'$ broken frame on previous chair '[]'$  ?????  '[x]'$  Back  '[x]'$  Angle Adjustable '[]'$  Custom molded [place] Acta Relief Back '[x]'$ postural control '[]'$ control of tone/spasticity '[x]'$ accommodation of range of motion '[x]'$ UE functional control '[]'$ accommodation for seating system '[]'$  ????? '[x]'$ provide lateral trunk support '[x]'$ accommodate deformity '[x]'$ provide posterior trunk support '[x]'$ provide lumbar/sacral support '[x]'$ support trunk in midline '[x]'$ Pressure relief over spinal processes  '[x]'$  Seat [place] '[x]'$ impaired sensation  '[]'$ decubitus ulcers present '[x]'$ history of pressure ulceration '[]'$ prevent pelvic extension '[]'$ low maintenance  '[x]'$ stabilize pelvis  '[x]'$ accommodate obliquity '[x]'$ accommodate multiple deformity '[x]'$ neutralize lower extremity position '[x]'$ increase pressure distribution '[]'$  ?????  '[]'$  Pelvic/thigh support  '[]'$  Lateral thigh guide '[]'$  Distal medial pad  '[]'$  Distal lateral pad '[]'$  pelvis in neutral '[]'$ accommodate pelvis '[]'$  position upper legs '[]'$  alignment '[]'$  accommodate ROM '[]'$  decr adduction '[]'$ accommodate tone '[]'$ removable for transfers '[]'$ decr abduction  '[]'$  Lateral trunk Supports '[]'$  Lt     '[]'$  Rt '[]'$ decrease lateral trunk leaning '[]'$ control tone '[]'$ contour for increased contact '[]'$ safety  '[]'$ accommodate asymmetry '[]'$  ?????  '[]'$  Mounting hardware  '[]'$ lateral trunk supports  '[]'$ back   '[]'$ seat '[]'$ headrest      '[]'$  thigh support '[]'$ fixed   '[]'$ swing away '[]'$ attach seat platform/cushion to w/c frame '[]'$ attach back cushion to w/c frame '[]'$ mount  postural supports '[]'$ mount headrest  '[]'$ swing medial thigh support away '[]'$ swing lateral supports away for transfers  '[]'$  ?????    Armrests  '[]'$ fixed '[x]'$ adjustable height '[]'$ removable   '[]'$ swing away  '[x]'$ flip back   '[]'$ reclining '[x]'$ full length pads '[]'$ desk    '[]'$ pads tubular  '[x]'$ provide support with elbow at 90   '[]'$ provide support for w/c tray '[x]'$ change of height/angles for variable activities '[x]'$ remove for transfers '[x]'$ allow to come closer to table top '[x]'$ remove for access to tables '[]'$  ?????  Hangers/ Leg rests  '[]'$ 60 '[x]'$ 70 '[]'$ 90 '[]'$ elevating '[]'$ heavy duty  '[]'$ articulating '[]'$ fixed '[x]'$ lift off '[x]'$ swing away     '[]'$ power '[x]'$ provide LE support  '[]'$ accommodate to hamstring tightness '[]'$ elevate legs during recline   '[]'$ provide change in position for Legs '[x]'$ Maintain placement of feet on footplate '[]'$ durability '[x]'$ enable transfers '[]'$ decrease edema '[]'$ Accommodate lower leg length '[]'$  ?????  Foot support Footplate    '[x]'$ Lt  '[x]'$  Rt  '[]'$  Center mount '[x]'$ flip up     '[]'$ depth/angle adjustable '[]'$ Amputee adapter    '[]'$  Lt     '[]'$  Rt '[x]'$ provide foot support '[]'$ accommodate to ankle ROM '[x]'$ transfers '[]'$ Provide support for residual extremity '[]'$  allow foot to go under wheelchair base '[]'$  decrease tone  '[]'$  ?????  [  x] Ankle strap/heel loops '[x]'$ support foot on foot support '[]'$ decrease extraneous movement '[x]'$ provide input to heel  '[x]'$ protect foot  Tires: '[]'$ pneumatic  '[]'$ flat free inserts  '[x]'$ solid  '[x]'$ decrease maintenance  '[x]'$ prevent frequent flats '[]'$ increase shock absorbency '[]'$ decrease pain from road shock '[]'$ decrease spasms from road shock '[]'$  ?????  '[]'$  Headrest  '[]'$ provide posterior head support '[]'$ provide posterior neck support '[]'$ provide lateral head support '[]'$ provide anterior head support '[]'$ support during tilt and recline '[]'$ improve feeding   '[]'$ improve respiration '[]'$ placement of switches '[]'$ safety  '[]'$ accommodate ROM  '[]'$ accommodate tone '[]'$ improve visual orientation  '[]'$  Anterior chest strap '[]'$  Vest '[]'$  Shoulder  retractors  '[]'$ decrease forward movement of shoulder '[]'$ accommodation of TLSO '[]'$ decrease forward movement of trunk '[]'$ decrease shoulder elevation '[]'$ added abdominal support '[]'$ alignment '[]'$ assistance with shoulder control  '[]'$  ?????  Pelvic Positioner '[]'$ Belt '[]'$ SubASIS bar '[]'$ Dual Pull '[]'$ stabilize tone '[]'$ decrease falling out of chair/ **will not Decr potential for sliding due to pelvic tilting '[]'$ prevent excessive rotation '[]'$ pad for protection over boney prominence '[]'$ prominence comfort '[]'$ special pull angle to control rotation '[]'$  ?????  Upper Extremity Support '[]'$ L   '[]'$  R '[]'$ Arm trough    '[]'$ hand support '[]'$  tray       '[]'$ full tray '[]'$ swivel mount '[]'$ decrease edema      '[]'$ decrease subluxation   '[]'$ control tone   '[]'$ placement for AAC/Computer/EADL '[]'$ decrease gravitational pull on shoulders '[]'$ provide midline positioning '[]'$ provide support to increase UE function '[]'$ provide hand support in natural position '[]'$ provide work surface   POWER WHEELCHAIR CONTROLS  '[]'$ Proportional  '[]'$ Non-Proportional Type ????? '[]'$ Left  '[]'$ Right '[]'$ provides access for controlling wheelchair   '[]'$ lacks motor control to operate proportional drive control '[]'$ unable to understand proportional controls  Actuator Control Module  '[]'$ Single  '[]'$ Multiple   '[]'$ Allow the client to operate the power seat function(s) through the joystick control   '[]'$ Safety Reset Switches '[]'$ Used to change modes and stop the wheelchair when driving in latch mode    '[]'$ Upgraded Electronics   '[]'$ programming for accurate control '[]'$ progressive Disease/changing condition '[]'$ non-proportional drive control needed '[]'$ Needed in order to operate power seat functions through joystick control   '[]'$ Display box '[]'$ Allows user to see in which mode and drive the wheelchair is set  '[]'$ necessary for alternate controls    '[]'$ Digital interface electronics '[]'$ Allows w/c to operate when using alternative drive controls  '[]'$ ASL Head Array '[]'$ Allows client to operate wheelchair  through  switches placed in tri-panel headrest  '[]'$ Sip and puff with tubing kit '[]'$ needed to operate sip and puff drive controls  '[]'$ Upgraded tracking electronics '[]'$ increase safety when driving '[]'$ correct tracking when on uneven surfaces  '[]'$ Mount for switches or joystick '[]'$ Attaches switches to w/c  '[]'$ Swing away for access or transfers '[]'$ midline for optimal placement '[]'$ provides for consistent access  '[]'$ Attendant controlled joystick plus mount '[]'$ safety '[]'$ long distance driving '[]'$ operation of seat functions '[]'$ compliance with transportation regulations '[]'$  ?????    Rear wheel placement/Axle adjustability '[]'$ None '[]'$ semi adjustable '[x]'$ fully adjustable  '[x]'$ improved UE access to wheels '[x]'$ improved stability '[x]'$ changing angle in space for improvement of postural stability '[]'$ 1-arm drive access '[]'$ amputee pad placement '[]'$  ?????  Wheel rims/ hand rims  '[x]'$ metal  '[]'$ plastic coated '[]'$ oblique projections '[]'$ vertical projections '[x]'$ Provide ability to propel manual wheelchair  '[]'$  Increase self-propulsion with hand weakness/decreased grasp  Push handles '[]'$ extended  '[]'$ angle adjustable  '[x]'$ standard '[x]'$ caregiver access '[x]'$ caregiver assist '[]'$ allows "hooking" to enable increased ability to perform ADLs or maintain balance  One armed device  '[]'$ Lt   '[]'$ Rt '[]'$ enable propulsion of manual wheelchair with one arm   '[]'$  ?????   Brake/wheel lock extension '[x]'$  Lt   '[x]'$  Rt [  x]increase indep in applying wheel locks   '[]'$ Side guards '[]'$ prevent clothing getting caught in wheel or becoming soiled '[]'$  prevent skin tears/abrasions  Battery: ????? '[]'$ to power wheelchair ?????  Other: ????? ????? ?????  The above equipment has a life- long use expectancy. Growth and changes in medical and/or functional conditions would be the exceptions. This is to certify that the therapist has no financial relationship with durable medical provider or manufacturer. The therapist will not receive remuneration of any kind for the equipment recommended in this  evaluation.   Patient has mobility limitation that significantly impairs safe, timely participation in one or more mobility related ADL's.  (bathing, toileting, feeding, dressing, grooming, moving from room to room)                                                             '[x]'$  Yes '[]'$  No Will mobility device sufficiently improve ability to participate and/or be aided in participation of MRADL's?         '[x]'$  Yes '[]'$  No Can limitation be compensated for with use of a cane or walker?                                                                                '[]'$  Yes '[x]'$  No Does patient or caregiver demonstrate ability/potential ability & willingness to safely use the mobility device?   '[x]'$  Yes '[]'$  No Does patient's home environment support use of recommended mobility device?                                                    '[x]'$  Yes '[]'$  No Does patient have sufficient upper extremity function necessary to functionally propel a manual wheelchair?    '[x]'$  Yes '[]'$  No Does patient have sufficient strength and trunk stability to safely operate a POV (scooter)?                                  '[]'$  Yes '[x]'$  No Does patient need additional features/benefits provided by a power wheelchair for MRADL's in the home?       '[]'$  Yes '[]'$  No Does the patient demonstrate the ability to safely use a power wheelchair?                                                              '[]'$  Yes '[]'$  No  Therapist Name Printed: Billie Ruddy, PT, DPT Date: 05/29/15  Therapist's Signature:   Date:   Supplier's Name Printed: [employer] Date: 05/29/15  Supplier's Signature:   Date:  Patient/Caregiver Signature:   Date:     This is to certify that I have read this evaluation and do agree with the content within:    Physician's Name Printed: ?????  Physician's Signature:  Date:     This is to certify that I, the above signed therapist have the following affiliations: '[]'$  This DME provider '[]'$  Manufacturer of recommended  equipment '[]'$  Patient's long term      Subjective Assessment - 05/29/15 1221    Subjective Patient presents today with request for manual wheelchair. Accompanied by wife.   Patient is accompained by: Family member  wife, [name]   Pertinent History * Monitor SpO2 closely *   PMH: PNA, lung CA (diagnosed 11/2014; currently no radiation or chemo), COPD, CVA (1998),  aneurysm clipping (1999), kyphoscoliosis, thoracolumbar fusion x8 levels, HTN, HLD, prostate cancer (2004), MI (2000), depression, anxiety, bipolar disorder   Patient Stated Goals "I'm here to get stronger, to improve my endurance."      Currently in Pain? No/denies    areacity  Ne  t ave                       PT Short Term Goals - 05/22/15 0555    PT SHORT TERM GOAL #1   Title Pt will independently perform HEP to maximize functional gains made in PT.    (Target date: 06/04/15)   Baseline Met 4/10.   Status Achieved   PT SHORT TERM GOAL #2   Title Orthotist will be present for PT session to assess/address if pt is appropriate candidate for R AFO to improve safety with short-distance household ambulation.   (Target date: 06/04/15)   Baseline Met 4/10.   Status Achieved   PT SHORT TERM GOAL #3   Title Schedule consult for custom w/c to improve positioning, postural alignment, contracture prevention, preservation of skin integrity, and improved breath support.    (Target date: 06/04/15)   Baseline Scheduled for 4/18.   Status Achieved   PT SHORT TERM GOAL #4   Title Pt will perform sit > stand from w/c with min A using LRAD to indicate increased safety with functional transfers.    (Target date: 06/04/15)   Baseline Met 4/3.   Status Achieved   PT SHORT TERM GOAL #5   Title Pt will ambulate x20' with Min A using LRAD with SpO2 > 89% to indicate improved activity tolerance.    (Target date: 06/04/15)   Baseline Met 4/3.   Status Achieved   PT SHORT TERM GOAL #6   Title Pt will negotiate 6 stairs with single R  rail to with Min A with SpO2 > 89% to indicate increased pt safety using primary home entrance.  (Target date: 06/04/15)   Status On-going     [place]     PT Long Term Goals - 05/12/15 1935    PT LONG TERM GOAL #1   Title Pt will perform sit > stand from w/c with supervision using LRAD to indicate increased safety with functional transfers.    (Target date: 07/02/15)   Status On-going   PT LONG TERM GOAL #2   Title Pt will ambulate 57' with supervision using LRAD with SpO2 > 89% to indicate increased safety with household ambulation.      (Target date: 07/02/15)   Status On-going   PT LONG TERM GOAL #3   Title Pt will negotiate 6 stairs with single R rail to with Min A with SpO2 >  89% to indicate increased pt safety using primary home entrance.    (Target date: 07/02/15)   Status On-going   PT LONG TERM GOAL #4   Title Pt will perform self-propulsion of w/c > 150' with mod I and SpO2 > 89% to indicate increased independence with w/c mobility.  (Target date: 07/02/15)   Status On-going           Plan - 05/29/15 1222    Clinical Impression Statement See attached note for custom manual wheelchair assessment and recommendations.    Rehab Potential Fair   Clinical Impairments Affecting Rehab Potential complex medical history   PT Frequency 2x / week   PT Duration 8 weeks   PT Treatment/Interventions ADLs/Self Care Home Management;DME Instruction;Stair training;Gait training;Neuromuscular re-education;Functional mobility training;Therapeutic activities;Therapeutic exercise;Balance training;Orthotic Fit/Training;Patient/family education;Wheelchair mobility training;Vestibular   PT Next Visit Plan Use R Reaction AFO during session. Consider progressing goals, as pt has already met majority of STG's.  Paced breathing with gait. See if pt able to lock w/c braces and safely remove w/c leg rests. Expand on HEP.   Consulted and Agree with Plan of Care Patient;Family member/caregiver   Family Member  Consulted wife, [name]        Patient will benefit from skilled therapeutic intervention in order to improve the following deficits and impairments:Abnormal gait, Cardiopulmonary status limiting activity, Decreased activity tolerance, Decreased balance, Decreased endurance, Decreased skin integrity, Decreased strength, Difficulty walking, Impaired sensation, Postural dysfunction, Pain, Dizziness   Visit Diagnosis:Other abnormalities of gait and mobility     Problem List Patient Active Problem List   Diagnosis Date Noted  . Sepsis ([place]) 04/01/2015  . CAP (community acquired pneumonia) 04/01/2015  . Cancer of lower lobe of right lung ([place]) 04/01/2015  . Chronic respiratory failure with hypoxia ([place]) 04/01/2015  . Cough 03/08/2015  . Dysphagia 02/01/2015  . Primary cancer of right lower lobe of lung ([place]) 12/05/2014  . Lung mass   . Pressure sore on elbow 05/10/2014  . Abscess of buttock, right 04/12/2014  . Recurrent aspiration pneumonia ([place]) 11/16/2013  . Toenail fungus 11/06/2013  . HCAP (healthcare-associated pneumonia) 10/23/2013  . Hypotension 10/16/2013  . 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 CAD (coronary artery disease) 07/27/2013  . Thoracic compression fracture ([place]) 06/21/2013  . Thoracic spine fracture ([place]) 06/21/2013  . Giant comedone 11/04/2012  . Nausea with vomiting 07/24/2012  . Unspecified constipation 07/24/2012  . Abdominal pain, acute, epigastric 07/22/2012  . Hyponatremia 07/22/2012  . Chronic lower back pain 07/22/2012  . Transaminitis 07/22/2012  . Prostate cancer ([place]) 04/22/2012  . Routine general medical examination at a health care facility 04/22/2012  . Pseudomonas infection 11/24/2011  . E coli bacteremia 11/24/2011  . Postoperative infection 11/24/2011  . Anemia associated with acute blood loss   . Bacteremia due to Pseudomonas 10/04/2011  . Sepsis(995.91) 10/02/2011  . 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 Falls frequently 07/31/2011  . COPD  GOLD  III with reversibility  01/14/2011  . Allergic rhinitis, seasonal 12/18/2010  . Angioedema 10/31/2010  . Neoplasm of uncertain behavior of skin 05/07/2010  . MYOCARDIAL INFARCTION 02/20/2010  . C V A / STROKE 02/20/2010  . CANDIDIASIS 02/13/2010  . Hyperlipidemia 02/13/2010  . Bipolar disorder ([place]) 02/13/2010  . DEPRESSION 02/13/2010  . Essential hypertension 02/13/2010  . TREMOR 02/13/2010  . PROSTATE CANCER, HX OF 02/13/2010  . Hx of TIA (transient ischemic attack) and stroke 02/13/2010  . Chronic daily headache 02/13/2010  Billie Ruddy, PT, DPT [facility]  [address] [place], [place], [address] Phone: [phone]    Fx:  [phone] 05/29/2015, 12:25 PM    Name: Malik Zuniga MRN: [id number] Date of Birth: 08/22/1945

## 2015-05-30 ENCOUNTER — Ambulatory Visit: Payer: Medicare Other | Admitting: Adult Health

## 2015-05-30 ENCOUNTER — Telehealth: Payer: Self-pay | Admitting: Adult Health

## 2015-05-30 NOTE — Telephone Encounter (Signed)
Try an antifungal cream, if no improvement then see PCP

## 2015-05-30 NOTE — Telephone Encounter (Signed)
Called spoke with [name]. I informed her of BQ's recs. She voiced understanding and had no further questions. Nothing further needed.

## 2015-05-30 NOTE — Telephone Encounter (Signed)
Called and spoke to pt's wife, Malik Zuniga. Malik Zuniga states the pt has developed a rash in his genital area x 1 month.  Pt contributes this rash to the prednisone that was started on '20mg'$  of a maintenance on 4/4. Pt states the prednisone is really helping with SOB and cough. Joselyn Arrow that it may not be from the prednisone because the rash was present before the maintenance dose of pred was started. Malik Zuniga states the rash worsened once the '20mg'$  of daily pred was started. Malik Zuniga stated there has not been any other environmental changes or medication changes that may have caused the rash. Joselyn Arrow that pt may need to see PCP as this may not be from the prednisone. Malik Zuniga requesting recs from BQ before visiting PCP as they are sure the reaction is from the prednisone.   Dr. [name] please advise. Thanks.

## 2015-06-01 ENCOUNTER — Ambulatory Visit: Payer: Medicare Other | Admitting: Physical Therapy

## 2015-06-01 VITALS — HR 103

## 2015-06-01 DIAGNOSIS — R2681 Unsteadiness on feet: Secondary | ICD-10-CM

## 2015-06-01 DIAGNOSIS — M6281 Muscle weakness (generalized): Secondary | ICD-10-CM | POA: Diagnosis not present

## 2015-06-01 DIAGNOSIS — R2689 Other abnormalities of gait and mobility: Secondary | ICD-10-CM | POA: Diagnosis not present

## 2015-06-01 DIAGNOSIS — M21371 Foot drop, right foot: Secondary | ICD-10-CM

## 2015-06-01 DIAGNOSIS — R293 Abnormal posture: Secondary | ICD-10-CM | POA: Diagnosis not present

## 2015-06-02 NOTE — Therapy (Signed)
[facility] [address] [place], [place], [address] Phone: [phone]   Fax:  [phone]  Physical Therapy Treatment  Patient Details  Name: Malik Zuniga MRN: [id number] Date of Birth: 1945-11-10 Referring Provider: Max Fickle, MD  Encounter Date: 06/01/2015      PT End of Session - 06/01/15 1309    Visit Number 6   Number of Visits 17   Date for PT Re-Evaluation 07/06/15   Authorization Type Medicare primary; BCBS secondary - G Codes and PN required every 10 visits   PT Start Time 1103   PT Stop Time 1146   PT Time Calculation (min) 43 min   Equipment Utilized During Treatment Gait belt   Activity Tolerance Patient limited by fatigue  Frequent seated rest breaks required due to fatigue.   Behavior During Therapy [facility] for tasks assessed/performed      Past Medical History  Diagnosis Date  . Hyperlipidemia     takes Simvastatin daily  . Tremor   . History of prostate cancer   . On home O2   . Neuromuscular disorder (HCC) 1998    right carpal tunnel release  . Anemia associated with acute blood loss   . GERD (gastroesophageal reflux disease)     takes Omeprazole daily  . Hypertension     takes Metoprolol daily as well as Hyzaar  . Constipation     takes Colace daily as well as Miralax  . Anxiety     takes Valium daily  . Depression     takes Cymbalta daily  . Emphysema of lung (HCC)     Albuterol as needed;Symbicort daily and Singulair at bedtime  . Emphysema   . Myocardial infarction (HCC) 04/1998  . Coronary artery disease   . Asthma   . Shortness of breath     with exertion  . History of bronchitis   . Aspiration pneumonia (HCC) 2010  . Headache, chronic daily   . History of migraine     last migraine a couple of days ago;takes Excedrin Migraine  . Chronic back pain     compression fracture  . History of colon polyps   . Mood change (HCC)     after Brain surgery mood changed and  was placed on Depakote  . Insomnia     takes Benadryl nightly  . Stroke [facility]) 1998    Brain Aneurysm  . Prostate cancer (HCC) 2004  . Lung cancer [facility])     Past Surgical History  Procedure Laterality Date  . Cholecystectomy  1996  . 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 Jordan Likes  . Vascular stent  2000    Dr. Alanda Amass; he reports cardiac stent, but denies stent for PAD 06/15/13  . Hand surgery  1989    crushed thumb, Dr. Merlyn Lot  . Ulnar nerve repair  1998    left arm, Dr. Merlyn Lot  . Gastrostomy w/ feeding tube  2008    Dr. Ewing Schlein; only had for a few months  . Prostatectomy  04/2001    removal of prostate cancer, Dr. Brunilda Payor  . Coronary angioplasty with stent placement  04/1998  . Carpal tunnel release  1998    right  . Spine surgery    . 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 Back surgery  08/2004; 02/2005; 04/2006; 06/2007; 7/20101    all by Dr. Jordan Likes  . Esophagogastroduodenoscopy N/A 07/23/2012    Procedure: ESOPHAGOGASTRODUODENOSCOPY (EGD);  Surgeon: Petra Kuba, MD;  Location: [facility] ENDOSCOPY;  Service: Endoscopy;  Laterality: N/A;  buccini /ja  . Colonoscopy    . Back surgery  05/2013  . Dg biopsy lung      Filed Vitals:   06/01/15 1107 06/01/15 1128 06/01/15 1135  Pulse: 102 93 103  SpO2: 88% 91% 91%        Subjective Assessment - 06/01/15 1107    Subjective Pt reports having had a bad day yesterday. Wife states, "I think he's feeling some of the effects of the prednisone. He has a bad  rash that's very uncomfortable." Pt will see PCP Monday.  PT and patient discussed goals for PT; see Patient Stated Goals below for details.    Patient is accompained by: Family member  wife, Olegario Messier   Pertinent History * Monitor SpO2 closely *   PMH: PNA, lung CA (diagnosed 11/2014; currently no radiation or chemo), COPD, CVA (1998),  aneurysm clipping (1999), kyphoscoliosis, thoracolumbar fusion x8 levels, HTN, HLD, prostate cancer (2004), MI (2000), depression, anxiety, bipolar disorder    Patient Stated Goals "I'd like to keep working on the stairs, so I feel more in control. I'd like to keep working on my endurance, and to be able to push my wheelchair further without getting so tired."   Currently in Pain? Yes   Pain Score 4    Pain Location Back   Pain Orientation Lower   Pain Descriptors / Indicators Aching   Pain Type Chronic pain   Pain Onset More than a month ago   Pain Frequency Constant   Aggravating Factors  standing and walking   Pain Relieving Factors pain medication   Effect of Pain on Daily Activities limits standing and walking tolerance   Multiple Pain Sites No                                 PT Education - 06/01/15 1305    Education provided Yes   Education Details Educated pt on current goals and the fact that he has already met majority of LTG's early. Discussed patient goals for remainder of PT. Explained to wife how to don/doff R AFO.   Person(s) Educated Spouse;Patient   Methods Explanation   Comprehension Verbalized understanding;Returned demonstration  Wife gave effective return demo of donning/doffing R AFO.          PT Short Term Goals - 06/02/15 1624    PT SHORT TERM GOAL #1   Title Pt will independently perform HEP to maximize functional gains made in PT.    (Target date: 06/04/15)   Baseline Met 4/10.   Status Achieved   PT SHORT TERM GOAL #2   Title Orthotist will be present for PT session to assess/address if pt is appropriate candidate for R AFO to improve safety with short-distance household ambulation.   (Target date: 06/04/15)   Baseline Orthotist present for session on 4/10.   Patient scheduled to receive personal R AFO week of 4/24.   Status Achieved   PT SHORT TERM GOAL #3   Title Schedule consult for custom w/c to improve positioning, postural alignment, contracture prevention, preservation of skin integrity, and improved breath support.    (Target date: 06/04/15)   Baseline Custom wheelchair  assessment occurred on 4/18. Pt scheduled to receive new ultra light custom wheelchair 6-10 weeks from assessment.     Status Achieved   PT SHORT TERM GOAL #4  Title Pt will perform sit > stand from w/c with min A using LRAD to indicate increased safety with functional transfers.    (Target date: 06/04/15)   Baseline Met 4/3.   Status Achieved   PT SHORT TERM GOAL #5   Title Pt will ambulate x20' with Min A using LRAD with SpO2 > 89% to indicate improved activity tolerance.    (Target date: 06/04/15)   Baseline Met 4/3.   Status Achieved   PT SHORT TERM GOAL #6   Title Pt will negotiate 6 stairs with single R rail to with Min A with SpO2 > 89% to indicate increased pt safety using primary home entrance.  (Target date: 06/04/15)   Baseline Met 4/20.   Status Achieved           PT Long Term Goals - 06/01/15 1124    PT LONG TERM GOAL #1   Title Pt will perform sit > stand from w/c with mod I using LRAD to indicate increased safety with functional transfers.    (Target date: 07/02/15)   Baseline 4/21: REVISED from supervision to Mod I due to pt progress.   Status Revised   PT LONG TERM GOAL #2   Title Pt will ambulate 150' with Mod I using LRAD with SpO2 > 89% to indicate increased safety with household ambulation.      (Target date: 07/02/15)   Baseline 4/21: REVISED from 34' to 150' and from supervision to mod I due to pt progress.   Status Revised   PT LONG TERM GOAL #3   Title Pt will negotiate 6 stairs with single R rail to with mod I with SpO2 > 89% to indicate increased pt safety using primary home entrance.    (Target date: 07/02/15)   Baseline 4/21: REVISED from supervision to mod I due to pt progress.   Status Revised   PT LONG TERM GOAL #4   Title Pt will perform self-propulsion of w/c > 150' over level, indoor surfaces with mod I and SpO2 > 89% to indicate increased independence with w/c mobility.  (Target date: 07/02/15)   Status On-going   PT LONG TERM GOAL #5   Title --    Status --               Plan - 06/01/15 1311    Clinical Impression Statement Session focused on assessing current goals and making modifications/additions, as appropriate. Pt has demonstrated excellent compliance, is motivated, and has made significant progress with mobility since beginning this episode of outpatient PT. Due to majority of short and long term goals met, revised long term goals based on functional needs and pt-expressed goals. See goal section for details.   Rehab Potential Fair   Clinical Impairments Affecting Rehab Potential complex medical history   PT Frequency 2x / week   PT Duration 8 weeks   PT Treatment/Interventions ADLs/Self Care Home Management;DME Instruction;Stair training;Gait training;Neuromuscular re-education;Functional mobility training;Therapeutic activities;Therapeutic exercise;Balance training;Orthotic Fit/Training;Patient/family education;Wheelchair mobility training;Vestibular   PT Next Visit Plan Use R Reaction AFO during session. Wheelchair mobility (focus on technique and energy conservation). Address new LTG's. Consider adding w/c propulsion program (focus on endurance, techique) for home. Once pt receives R AFO, initiate walking program.    Consulted and Agree with Plan of Care Patient;Family member/caregiver   Family Member Consulted wife, Olegario Messier      Patient will benefit from skilled therapeutic intervention in order to improve the following deficits and impairments:  Abnormal gait, Cardiopulmonary status limiting  activity, Decreased activity tolerance, Decreased balance, Decreased endurance, Decreased skin integrity, Decreased strength, Difficulty walking, Impaired sensation, Postural dysfunction, Pain, Dizziness  Visit Diagnosis: Other abnormalities of gait and mobility - Plan: PT plan of care cert/re-cert  Unsteadiness on feet - Plan: PT plan of care cert/re-cert  Foot drop, right - Plan: PT plan of care cert/re-cert  Muscle weakness  (generalized) - Plan: PT plan of care cert/re-cert     Problem List Patient Active Problem List   Diagnosis Date Noted  . Sepsis ([place]) 04/01/2015  . CAP (community acquired pneumonia) 04/01/2015  . Cancer of lower lobe of right lung ([place]) 04/01/2015  . Chronic respiratory failure with hypoxia ([place]) 04/01/2015  . Cough 03/08/2015  . Dysphagia 02/01/2015  . Primary cancer of right lower lobe of lung ([place]) 12/05/2014  . Lung mass   . Pressure sore on elbow 05/10/2014  . Abscess of buttock, right 04/12/2014  . Recurrent aspiration pneumonia ([place]) 11/16/2013  . Toenail fungus 11/06/2013  . HCAP (healthcare-associated pneumonia) 10/23/2013  . Hypotension 10/16/2013  . 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 CAD (coronary artery disease) 07/27/2013  . Thoracic compression fracture ([place]) 06/21/2013  . Thoracic spine fracture ([place]) 06/21/2013  . Giant comedone 11/04/2012  . Nausea with vomiting 07/24/2012  . Unspecified constipation 07/24/2012  . Abdominal pain, acute, epigastric 07/22/2012  . Hyponatremia 07/22/2012  . Chronic lower back pain 07/22/2012  . Transaminitis 07/22/2012  . Prostate cancer ([place]) 04/22/2012  . Routine general medical examination at a health care facility 04/22/2012  . Pseudomonas infection 11/24/2011  . E coli bacteremia 11/24/2011  . Postoperative infection 11/24/2011  . Anemia associated with acute blood loss   . Bacteremia due to Pseudomonas 10/04/2011  . Sepsis(995.91) 10/02/2011  . 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 Falls frequently 07/31/2011  . COPD  GOLD III with reversibility  01/14/2011  . Allergic rhinitis, seasonal 12/18/2010  . Angioedema 10/31/2010  . Neoplasm of uncertain behavior of skin 05/07/2010  . MYOCARDIAL INFARCTION 02/20/2010  . C V A / STROKE 02/20/2010  . CANDIDIASIS 02/13/2010  . Hyperlipidemia 02/13/2010  . Bipolar disorder ([place]) 02/13/2010  . DEPRESSION 02/13/2010  . Essential hypertension 02/13/2010  . TREMOR 02/13/2010  .  PROSTATE CANCER, HX OF 02/13/2010  . Hx of TIA (transient ischemic attack) and stroke 02/13/2010  . Chronic daily headache 02/13/2010   Billie Ruddy, PT, DPT [facility] [address] [place], [place], [address] Phone: [phone]   Fax:  [phone] 06/02/2015, 4:39 PM   Name: DIANNA DESHLER MRN: [id number] Date of Birth: Jan 05, 1946

## 2015-06-04 ENCOUNTER — Ambulatory Visit: Payer: Medicare Other | Admitting: Physical Therapy

## 2015-06-05 ENCOUNTER — Ambulatory Visit (INDEPENDENT_AMBULATORY_CARE_PROVIDER_SITE_OTHER): Payer: Medicare Other | Admitting: Family Medicine

## 2015-06-05 ENCOUNTER — Encounter: Payer: Self-pay | Admitting: Family Medicine

## 2015-06-05 VITALS — BP 124/84 | HR 85 | Temp 97.9°F | Resp 17 | Ht 70.0 in

## 2015-06-05 DIAGNOSIS — Z8673 Personal history of transient ischemic attack (TIA), and cerebral infarction without residual deficits: Secondary | ICD-10-CM

## 2015-06-05 DIAGNOSIS — I10 Essential (primary) hypertension: Secondary | ICD-10-CM

## 2015-06-05 DIAGNOSIS — I2584 Coronary atherosclerosis due to calcified coronary lesion: Secondary | ICD-10-CM | POA: Diagnosis not present

## 2015-06-05 DIAGNOSIS — I2510 Atherosclerotic heart disease of native coronary artery without angina pectoris: Secondary | ICD-10-CM

## 2015-06-05 DIAGNOSIS — E785 Hyperlipidemia, unspecified: Secondary | ICD-10-CM

## 2015-06-05 DIAGNOSIS — B356 Tinea cruris: Secondary | ICD-10-CM | POA: Diagnosis not present

## 2015-06-05 LAB — HEPATIC FUNCTION PANEL
ALK PHOS: 42 U/L (ref 39–117)
ALT: 10 U/L (ref 0–53)
AST: 15 U/L (ref 0–37)
Albumin: 4.1 g/dL (ref 3.5–5.2)
BILIRUBIN DIRECT: 0.1 mg/dL (ref 0.0–0.3)
BILIRUBIN TOTAL: 0.3 mg/dL (ref 0.2–1.2)
Total Protein: 7.2 g/dL (ref 6.0–8.3)

## 2015-06-05 LAB — CBC WITH DIFFERENTIAL/PLATELET
BASOS PCT: 0.1 % (ref 0.0–3.0)
Basophils Absolute: 0 10*3/uL (ref 0.0–0.1)
EOS PCT: 0.1 % (ref 0.0–5.0)
Eosinophils Absolute: 0 10*3/uL (ref 0.0–0.7)
HCT: 36.1 % — ABNORMAL LOW (ref 39.0–52.0)
Hemoglobin: 11.8 g/dL — ABNORMAL LOW (ref 13.0–17.0)
LYMPHS ABS: 1.1 10*3/uL (ref 0.7–4.0)
Lymphocytes Relative: 17.2 % (ref 12.0–46.0)
MCHC: 32.8 g/dL (ref 30.0–36.0)
MCV: 87.4 fl (ref 78.0–100.0)
MONOS PCT: 4.7 % (ref 3.0–12.0)
Monocytes Absolute: 0.3 10*3/uL (ref 0.1–1.0)
NEUTROS ABS: 4.9 10*3/uL (ref 1.4–7.7)
NEUTROS PCT: 77.9 % — AB (ref 43.0–77.0)
Platelets: 379 10*3/uL (ref 150.0–400.0)
RBC: 4.13 Mil/uL — AB (ref 4.22–5.81)
RDW: 16.3 % — AB (ref 11.5–15.5)
WBC: 6.3 10*3/uL (ref 4.0–10.5)

## 2015-06-05 LAB — BASIC METABOLIC PANEL
BUN: 16 mg/dL (ref 6–23)
CO2: 27 mEq/L (ref 19–32)
Calcium: 9.7 mg/dL (ref 8.4–10.5)
Chloride: 92 mEq/L — ABNORMAL LOW (ref 96–112)
Creatinine, Ser: 0.72 mg/dL (ref 0.40–1.50)
GFR: 114.72 mL/min (ref >60.00)
Glucose, Bld: 115 mg/dL — ABNORMAL HIGH (ref 70–99)
POTASSIUM: 5 meq/L (ref 3.5–5.1)
Sodium: 126 mEq/L — ABNORMAL LOW (ref 135–145)

## 2015-06-05 LAB — LIPID PANEL
CHOL/HDL RATIO: 2
CHOLESTEROL: 249 mg/dL — AB (ref 0–200)
HDL: 114.6 mg/dL (ref >39.00)
LDL CALC: 124 mg/dL — AB (ref 0–99)
NonHDL: 134.73
TRIGLYCERIDES: 54 mg/dL (ref 0.0–149.0)
VLDL: 10.8 mg/dL (ref 0.0–40.0)

## 2015-06-05 LAB — TSH: TSH: 0.57 u[IU]/mL (ref 0.35–4.50)

## 2015-06-05 MED ORDER — CLOTRIMAZOLE-BETAMETHASONE 1-0.05 % EX CREA: 1.0000 "application " | TOPICAL_CREAM | Freq: Two times a day (BID) | CUTANEOUS | Status: DC

## 2015-06-05 MED ORDER — FLUCONAZOLE 100 MG PO TABS: 100.0000 mg | ORAL_TABLET | Freq: Every day | ORAL | Status: DC

## 2015-06-05 NOTE — Progress Notes (Signed)
Pre visit review using our clinic review tool, if applicable. No additional management support is needed unless otherwise documented below in the visit note. 

## 2015-06-05 NOTE — Assessment & Plan Note (Signed)
Chronic problem.  Tolerating statin w/o difficulty.  Will temporarily hold while on diflucan.  Check labs.  Adjust meds prn

## 2015-06-05 NOTE — Patient Instructions (Signed)
Schedule your complete physical in 6 months We'll notify you of your lab results and make any changes if needed Apply the Lotrisone combo cream twice daily to the rash Take the Diflucan once daily for 7 days- HOLD the Simvastatin while on the Diflucan Call with any questions or concerns Hang in there!!!

## 2015-06-05 NOTE — Assessment & Plan Note (Signed)
Pt is doing well w/ PT and is getting new AFO and wheelchair.  He is quite excited about both of these things.

## 2015-06-05 NOTE — Progress Notes (Signed)
   Subjective:    Patient ID: Malik Zuniga, male    DOB: [birth year], 70 y.o.   MRN: [id number]  HPI HTN- chronic problem, on Losartan HCTZ and Metoprolol w/ good control.  No CP.  Pt has chronic SOB.  Denies HA, visual changes, edema.  Hyperlipidemia- chronic problem, on Simvastatin.  Denies abd pain, N/V  Hx of TIA/Stroke/Frequent falls- pt has R foot drop, is in the process of getting a new AFO.  Getting PT to help w/ mobility and strength.  Getting a new wheelchair.  Rash- pt reports angry, red rash in groin.  Rash started when he began prednisone daily.  + itchy.  Using Lotrimin w/o relief for the last week.   Review of Systems For ROS see HPI     Objective:   Physical Exam  Constitutional: He is oriented to person, place, and time. He appears well-developed and well-nourished. No distress.  HENT:  Head: Normocephalic and atraumatic.  Cardiovascular: Normal rate, regular rhythm, normal heart sounds and intact distal pulses.   Pulmonary/Chest: Effort normal. No respiratory distress. He has wheezes (scattered expiratory wheezes).  Musculoskeletal: He exhibits no edema.  Neurological: He is alert and oriented to person, place, and time.  Skin: Skin is warm and dry. Rash (pt has deep red- almost purple- fungal rash in bilateral groin creases and extending onto testicles bilaterally w/ satellite lesion present) noted. There is erythema.  Psychiatric: He has a normal mood and affect. His behavior is normal. Thought content normal.  Vitals reviewed.         Assessment & Plan:

## 2015-06-05 NOTE — Assessment & Plan Note (Signed)
New.  Pt w/ extensive fungal rash in groin since starting daily prednisone.  Given the severity, will start both topical and oral tx.  Pt advised to hold statin while taking diflucan.  Pt expressed understanding and is in agreement w/ plan.

## 2015-06-05 NOTE — Assessment & Plan Note (Signed)
Chronic problem.  Adequate control.  Asymptomatic w/ exception of chronic SOB.  Check labs.  No anticipated med changes.

## 2015-06-06 ENCOUNTER — Other Ambulatory Visit: Payer: Self-pay | Admitting: General Practice

## 2015-06-06 ENCOUNTER — Ambulatory Visit: Payer: Medicare Other | Admitting: Physical Therapy

## 2015-06-06 ENCOUNTER — Ambulatory Visit: Payer: Self-pay | Admitting: *Deleted

## 2015-06-06 DIAGNOSIS — R2689 Other abnormalities of gait and mobility: Secondary | ICD-10-CM

## 2015-06-06 DIAGNOSIS — R2681 Unsteadiness on feet: Secondary | ICD-10-CM

## 2015-06-06 DIAGNOSIS — M6281 Muscle weakness (generalized): Secondary | ICD-10-CM | POA: Diagnosis not present

## 2015-06-06 DIAGNOSIS — R293 Abnormal posture: Secondary | ICD-10-CM | POA: Diagnosis not present

## 2015-06-06 DIAGNOSIS — M21371 Foot drop, right foot: Secondary | ICD-10-CM | POA: Diagnosis not present

## 2015-06-06 MED ORDER — LOSARTAN POTASSIUM 50 MG PO TABS: 50.0000 mg | ORAL_TABLET | Freq: Every day | ORAL | Status: DC

## 2015-06-07 ENCOUNTER — Other Ambulatory Visit: Payer: Self-pay | Admitting: *Deleted

## 2015-06-07 NOTE — Therapy (Signed)
[facility] [address] [place], [place], [address] Phone: [phone]   Fax:  [phone]  Physical Therapy Treatment  Patient Details  Name: Malik Zuniga MRN: [id number] Date of Birth: [birth year] Referring Provider: Simonne Maffucci, MD  Encounter Date: 06/06/2015      PT End of Session - 06/07/15 0749    Visit Number 7   Number of Visits 17   Date for PT Re-Evaluation 07/06/15   Authorization Type Medicare primary; BCBS secondary - G Codes and PN required every 10 visits   PT Start Time 0934   PT Stop Time 1012   PT Time Calculation (min) 38 min   Equipment Utilized During Treatment Gait belt   Activity Tolerance Patient limited by fatigue  Frequent seated rest breaks required due to fatigue.   Behavior During Therapy [facility] for tasks assessed/performed      Past Medical History  Diagnosis Date  . Hyperlipidemia     takes Simvastatin daily  . Tremor   . History of prostate cancer   . On home O2   . Neuromuscular disorder ([place]) 1998    right carpal tunnel release  . Anemia associated with acute blood loss   . GERD (gastroesophageal reflux disease)     takes Omeprazole daily  . Hypertension     takes Metoprolol daily as well as Hyzaar  . Constipation     takes Colace daily as well as Miralax  . Anxiety     takes Valium daily  . Depression     takes Cymbalta daily  . Emphysema of lung ([facility])     Albuterol as needed;Symbicort daily and Singulair at bedtime  . Emphysema   . Myocardial infarction ([place]) 04/1998  . Coronary artery disease   . Asthma   . Shortness of breath     with exertion  . History of bronchitis   . Aspiration pneumonia ([place]) 2010  . Headache, chronic daily   . History of migraine     last migraine a couple of days ago;takes Excedrin Migraine  . Chronic back pain     compression fracture  . History of colon polyps   . Mood change ([place])     after Brain surgery mood changed and  was placed on Depakote  . Insomnia     takes Benadryl nightly  . 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 Prostate cancer ([place]) 2004  . Lung cancer [place])     Past Surgical History  Procedure Laterality Date  . Cholecystectomy  1996  . 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 Annette Stable  . Vascular stent  2000    Dr. Rollene Fare; he reports cardiac stent, but denies stent for PAD 06/15/13  . Hand surgery  1989    crushed thumb, Dr. Fredna Dow  . Ulnar nerve repair  1998    left arm, Dr. Fredna Dow  . Gastrostomy w/ feeding tube  2008    Dr. Watt Climes; only had for a few months  . Prostatectomy  04/2001    removal of prostate cancer, Dr. Janice Norrie  . Coronary angioplasty with stent placement  04/1998  . Carpal tunnel release  1998    right  . Spine surgery    . 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 Back surgery  08/2004; 02/2005; 04/2006; 06/2007; 7/20101    all by Dr. Annette Stable  . Esophagogastroduodenoscopy N/A 07/23/2012    Procedure: ESOPHAGOGASTRODUODENOSCOPY (EGD);  Surgeon: Jeryl Columbia, MD;  Location: [facility] ENDOSCOPY;  Service: Endoscopy;  Laterality: N/A;  buccini /ja  . Colonoscopy    . Back surgery  05/2013  . Dg biopsy lung      There were no vitals filed for this visit.      Subjective Assessment - 06/06/15 0937    Subjective Had a flare up of pneumonia-that's why I wasnt here earlier this week.  Gerald Stabs, orthotist, present to deliver R AFO   Pertinent History * Monitor SpO2 closely *   PMH: PNA, lung CA (diagnosed 11/2014; currently no radiation or chemo), COPD, CVA (1998),  aneurysm clipping (1999), kyphoscoliosis, thoracolumbar fusion x8 levels, HTN, HLD, prostate cancer (2004), MI (2000), depression, anxiety, bipolar disorder   Patient Stated Goals "I'd like to keep working on the stairs, so I feel more in control. I'd like to keep working on my endurance, and to be able to push my wheelchair further without getting so tired."   Currently in Pain? Yes   Pain Score 3    Pain Location Back   Pain  Orientation Lower   Pain Descriptors / Indicators Aching   Pain Type Chronic pain   Pain Onset More than a month ago   Pain Frequency Constant   Aggravating Factors  standing and walking   Pain Relieving Factors pain medication   Effect of Pain on Daily Activities limits standing and walking tolerance                         [facility] Adult PT Treatment/Exercise - 06/06/15 0943    Transfers   Transfers Sit to Stand;Stand to Sit   Sit to Stand 6: Modified independent (Device/Increase time)   Stand to Sit 5: Supervision   Comments Cues for hand placement with stand to sit back in wheelchair from RW   Ambulation/Gait   Ambulation/Gait Yes   Ambulation/Gait Assistance 4: Min guard   Ambulation/Gait Assistance Details several standing rest breaks   Ambulation Distance (Feet) 107 Feet  then 38 ft. towards end of session   Assistive device Rolling walker;Other (Comment)  R AFO (delivered by orthotist today)   Gait Pattern Decreased step length - left;Decreased stride length;Decreased stance time - right;Decreased hip/knee flexion - right;Decreased hip/knee flexion - left;Decreased weight shift to right;Right flexed knee in stance;Left flexed knee in stance;Trunk flexed;Wide base of support;Step-through pattern;Step-to pattern  improved R step length, foot clearance/placement with AFO   Ambulation Surface Level;Indoor   Pre-Gait Activities Othotist delivers pt's R Reaction AFO, and pt/wife provided instruction in wear/care/use of AFO.  Pt/wife verbalize understanding.  Pt keeps R Reaction AFO on during entire session of PT, and has no c/o at end of session.  Instructed patient/wife to check skin upon return home and taking off brace.   Gait Comments With use of R Reaction AFO, pt notes overall improved comfort and ease of gait.   Educational psychologist Assistance 5: Water engineer Both upper extremities   Wheelchair Parts  Management Supervision/cueing   Distance 115  then 35 ft   Comments Pt able to demonstrate understanding of efficient wheelchair propulsion in indoor, level surface gym area.     Exercises   Exercises Other Exercises   Other Exercises  Standing in front of (locked) w/c with BUE support at sink, pt performed squats 2 x5 reps (to pt fatigue) with min cueing for technique.  Standing at  sink-reaching with UE's to cabinet, 6 reps each UE, for improved standing balance, decreased UE reliance.  Standing R step taps x 3 reps, then L step taps x 7 reps with UE support, cues for posture.  PT provides min guard assistance                PT Education - 06/07/15 0749    Education provided Yes   Education Details Wear/care/use of AFO   Person(s) Educated Patient;Spouse   Methods Explanation   Comprehension Verbalized understanding          PT Short Term Goals - 06/02/15 1624    PT SHORT TERM GOAL #1   Title Pt will independently perform HEP to maximize functional gains made in PT.    (Target date: 06/04/15)   Baseline Met 4/10.   Status Achieved   PT SHORT TERM GOAL #2   Title Orthotist will be present for PT session to assess/address if pt is appropriate candidate for R AFO to improve safety with short-distance household ambulation.   (Target date: 06/04/15)   Baseline Orthotist present for session on 4/10.   Patient scheduled to receive personal R AFO week of 4/24.   Status Achieved   PT SHORT TERM GOAL #3   Title Schedule consult for custom w/c to improve positioning, postural alignment, contracture prevention, preservation of skin integrity, and improved breath support.    (Target date: 06/04/15)   Baseline Custom wheelchair assessment occurred on 4/18. Pt scheduled to receive new ultra light custom wheelchair 6-10 weeks from assessment.     Status Achieved   PT SHORT TERM GOAL #4   Title Pt will perform sit > stand from w/c with min A using LRAD to indicate increased safety with  functional transfers.    (Target date: 06/04/15)   Baseline Met 4/3.   Status Achieved   PT SHORT TERM GOAL #5   Title Pt will ambulate x20' with Min A using LRAD with SpO2 > 89% to indicate improved activity tolerance.    (Target date: 06/04/15)   Baseline Met 4/3.   Status Achieved   PT SHORT TERM GOAL #6   Title Pt will negotiate 6 stairs with single R rail to with Min A with SpO2 > 89% to indicate increased pt safety using primary home entrance.  (Target date: 06/04/15)   Baseline Met 4/20.   Status Achieved           PT Long Term Goals - 06/01/15 1124    PT LONG TERM GOAL #1   Title Pt will perform sit > stand from w/c with mod I using LRAD to indicate increased safety with functional transfers.    (Target date: 07/02/15)   Baseline 4/21: REVISED from supervision to Mod I due to pt progress.   Status Revised   PT LONG TERM GOAL #2   Title Pt will ambulate 150' with Mod I using LRAD with SpO2 > 89% to indicate increased safety with household ambulation.      (Target date: 07/02/15)   Baseline 4/21: REVISED from 9' to 150' and from supervision to mod I due to pt progress.   Status Revised   PT LONG TERM GOAL #3   Title Pt will negotiate 6 stairs with single R rail to with mod I with SpO2 > 89% to indicate increased pt safety using primary home entrance.    (Target date: 07/02/15)   Baseline 4/21: REVISED from supervision to mod I due to pt progress.  Status Revised   PT LONG TERM GOAL #4   Title Pt will perform self-propulsion of w/c > 150' over level, indoor surfaces with mod I and SpO2 > 89% to indicate increased independence with w/c mobility.  (Target date: 07/02/15)   Status On-going   PT LONG TERM GOAL #5   Title --   Status --               Plan - 06/07/15 0750    Clinical Impression Statement Session focused on orthotic/gait training with newly delivered R reaction AFO.  Pt appears to be very pleased with AFO, and he demonstrates improved gait pattern and  endurance during PT session today.  Pt continues to be motivated for continued gait and standing/wheelchair functional activities.   Rehab Potential Fair   Clinical Impairments Affecting Rehab Potential complex medical history   PT Frequency 2x / week   PT Duration 8 weeks   PT Treatment/Interventions ADLs/Self Care Home Management;DME Instruction;Stair training;Gait training;Neuromuscular re-education;Functional mobility training;Therapeutic activities;Therapeutic exercise;Balance training;Orthotic Fit/Training;Patient/family education;Wheelchair mobility training;Vestibular   PT Next Visit Plan Use R Reaction AFO during session.  Address new LTG's. Consider adding w/c propulsion program (focus on endurance, techique) for home. Initiate walking program.    Consulted and Agree with Plan of Care Patient;Family member/caregiver   Family Member Consulted wife, [name]      Patient will benefit from skilled therapeutic intervention in order to improve the following deficits and impairments:  Abnormal gait, Cardiopulmonary status limiting activity, Decreased activity tolerance, Decreased balance, Decreased endurance, Decreased skin integrity, Decreased strength, Difficulty walking, Impaired sensation, Postural dysfunction, Pain, Dizziness  Visit Diagnosis: Other abnormalities of gait and mobility  Unsteadiness on feet     Problem List Patient Active Problem List   Diagnosis Date Noted  . Tinea cruris 06/05/2015  . Sepsis ([place]) 04/01/2015  . CAP (community acquired pneumonia) 04/01/2015  . Cancer of lower lobe of right lung ([place]) 04/01/2015  . Chronic respiratory failure with hypoxia ([place]) 04/01/2015  . Cough 03/08/2015  . Dysphagia 02/01/2015  . Primary cancer of right lower lobe of lung ([place]) 12/05/2014  . Lung mass   . Pressure sore on elbow 05/10/2014  . Abscess of buttock, right 04/12/2014  . Recurrent aspiration pneumonia ([place]) 11/16/2013  . Toenail fungus 11/06/2013  . HCAP  (healthcare-associated pneumonia) 10/23/2013  . Hypotension 10/16/2013  . 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 CAD (coronary artery disease) 07/27/2013  . Thoracic compression fracture ([place]) 06/21/2013  . Thoracic spine fracture ([place]) 06/21/2013  . Giant comedone 11/04/2012  . Nausea with vomiting 07/24/2012  . Unspecified constipation 07/24/2012  . Abdominal pain, acute, epigastric 07/22/2012  . Hyponatremia 07/22/2012  . Chronic lower back pain 07/22/2012  . Transaminitis 07/22/2012  . Prostate cancer ([place]) 04/22/2012  . Routine general medical examination at a health care facility 04/22/2012  . Pseudomonas infection 11/24/2011  . E coli bacteremia 11/24/2011  . Postoperative infection 11/24/2011  . Anemia associated with acute blood loss   . Bacteremia due to Pseudomonas 10/04/2011  . Sepsis(995.91) 10/02/2011  . 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 Falls frequently 07/31/2011  . COPD  GOLD III with reversibility  01/14/2011  . Allergic rhinitis, seasonal 12/18/2010  . Angioedema 10/31/2010  . Neoplasm of uncertain behavior of skin 05/07/2010  . MYOCARDIAL INFARCTION 02/20/2010  . C V A / STROKE 02/20/2010  . CANDIDIASIS 02/13/2010  . Hyperlipidemia 02/13/2010  . Bipolar disorder ([place]) 02/13/2010  . DEPRESSION 02/13/2010  . Essential hypertension 02/13/2010  . TREMOR 02/13/2010  .  PROSTATE CANCER, HX OF 02/13/2010  . Hx of TIA (transient ischemic attack) and stroke 02/13/2010  . Chronic daily headache 02/13/2010    Ebony Rickel W. 06/07/2015, 7:56 AM Frazier Butt., PT [facility] [address] [place], [place], [address] Phone: [phone]   Fax:  [phone]  Name: Malik Zuniga MRN: [id number] Date of Birth: 06/09/1945

## 2015-06-08 ENCOUNTER — Encounter: Payer: Self-pay | Admitting: *Deleted

## 2015-06-11 ENCOUNTER — Ambulatory Visit: Payer: Medicare Other | Attending: Adult Health | Admitting: Physical Therapy

## 2015-06-11 VITALS — HR 101

## 2015-06-11 DIAGNOSIS — R2681 Unsteadiness on feet: Secondary | ICD-10-CM

## 2015-06-11 DIAGNOSIS — R2689 Other abnormalities of gait and mobility: Secondary | ICD-10-CM | POA: Diagnosis not present

## 2015-06-11 DIAGNOSIS — M6281 Muscle weakness (generalized): Secondary | ICD-10-CM | POA: Insufficient documentation

## 2015-06-11 DIAGNOSIS — M21371 Foot drop, right foot: Secondary | ICD-10-CM | POA: Insufficient documentation

## 2015-06-11 NOTE — Patient Instructions (Signed)
Psychologist, occupational for [employer] your wheelchair (with both hands) for 4 consecutive minutes. Check your oxygen every 1-2 minutes. If oxygen is below 90%, stop and rest until oxygen is > 92%. Increase by 1 minute per week, as tolerated. Try to do this over level, indoor surfaces.

## 2015-06-11 NOTE — Therapy (Signed)
[facility] [address] [place], [place], [address] Phone: [phone]   Fax:  [phone]  Physical Therapy Treatment  Patient Details  Name: Malik Zuniga MRN: [id number] Date of Birth: 10-26-1945 Referring Provider: Simonne Maffucci, MD  Encounter Date: 06/11/2015      PT End of Session - 06/11/15 1309    Visit Number 8   Number of Visits 17   Date for PT Re-Evaluation 07/06/15   Authorization Type Medicare primary; BCBS secondary - G Codes and PN required every 10 visits   PT Start Time 1103   PT Stop Time 1150   PT Time Calculation (min) 47 min   Equipment Utilized During Treatment Gait belt   Activity Tolerance Patient limited by fatigue  multiple seated rest breaks due to fatigue   Behavior During Therapy [facility] for tasks assessed/performed      Past Medical History  Diagnosis Date  . Hyperlipidemia     takes Simvastatin daily  . Tremor   . History of prostate cancer   . On home O2   . Neuromuscular disorder ([place]) 1998    right carpal tunnel release  . Anemia associated with acute blood loss   . GERD (gastroesophageal reflux disease)     takes Omeprazole daily  . Hypertension     takes Metoprolol daily as well as Hyzaar  . Constipation     takes Colace daily as well as Miralax  . Anxiety     takes Valium daily  . Depression     takes Cymbalta daily  . Emphysema of lung ([facility])     Albuterol as needed;Symbicort daily and Singulair at bedtime  . Emphysema   . Myocardial infarction ([place]) 04/1998  . Coronary artery disease   . Asthma   . Shortness of breath     with exertion  . History of bronchitis   . Aspiration pneumonia ([place]) 2010  . Headache, chronic daily   . History of migraine     last migraine a couple of days ago;takes Excedrin Migraine  . Chronic back pain     compression fracture  . History of colon polyps   . Mood change ([place])     after Brain surgery mood changed and was placed on  Depakote  . Insomnia     takes Benadryl nightly  . 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 Prostate cancer ([place]) 2004  . Lung cancer [facility])     Past Surgical History  Procedure Laterality Date  . Cholecystectomy  1996  . 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 Annette Stable  . Vascular stent  2000    Dr. Rollene Fare; he reports cardiac stent, but denies stent for PAD 06/15/13  . Hand surgery  1989    crushed thumb, Dr. Fredna Dow  . Ulnar nerve repair  1998    left arm, Dr. Fredna Dow  . Gastrostomy w/ feeding tube  2008    Dr. Watt Climes; only had for a few months  . Prostatectomy  04/2001    removal of prostate cancer, Dr. Janice Norrie  . Coronary angioplasty with stent placement  04/1998  . Carpal tunnel release  1998    right  . Spine surgery    . 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 Back surgery  08/2004; 02/2005; 04/2006; 06/2007; 7/20101    all by Dr. Annette Stable  . Esophagogastroduodenoscopy N/A 07/23/2012    Procedure: ESOPHAGOGASTRODUODENOSCOPY (EGD);  Surgeon: Jeryl Columbia, MD;  Location: [facility] ENDOSCOPY;  Service: Endoscopy;  Laterality: N/A;  buccini /ja  . Colonoscopy    . Back surgery  05/2013  . Dg biopsy lung      Filed Vitals:   06/11/15 1112 06/11/15 1123 06/11/15 1124  Pulse: 79 102 101  SpO2: 93% 92% 91%        Subjective Assessment - 06/11/15 1112    Subjective Pt/wife report pt is "extra wobbly" today. Pt reports feeling weaker and more winded that he normally does.   Upon further questioning, pt states, "I almost fell several times earlier today. I as kind of dizzy; don't know why." No falls. Is wearing new R AFO today.   Patient is accompained by: Family member   Pertinent History * Monitor SpO2 closely *   PMH: PNA, lung CA (diagnosed 11/2014; currently no radiation or chemo), COPD, CVA (1998),  aneurysm clipping (1999), kyphoscoliosis, thoracolumbar fusion x8 levels, HTN, HLD, prostate cancer (2004), MI (2000), depression, anxiety, bipolar disorder   Patient Stated Goals "I'd like to keep  working on the stairs, so I feel more in control. I'd like to keep working on my endurance, and to be able to push my wheelchair further without getting so tired."   Currently in Pain? Yes   Pain Score 3    Pain Location Back   Pain Orientation Lower   Pain Descriptors / Indicators Aching   Pain Type Acute pain   Pain Onset More than a month ago   Pain Frequency Constant   Aggravating Factors  standing and walking   Pain Relieving Factors pain medication   Effect of Pain on Daily Activities limits standing and walking tolerance   Multiple Pain Sites No                Vestibular Assessment - 06/11/15 0001    Orthostatics   BP supine (x 5 minutes) --  did not include supine due to back pain in supine   BP sitting 147/83 mmHg  sitting x3 minutes   HR sitting 92   BP standing (after 1 minute) 118/76 mmHg  asymptomatic   HR standing (after 1 minute) 99                 [facility] Adult PT Treatment/Exercise - 06/11/15 0001    Transfers   Transfers Sit to Stand;Stand to Sit   Sit to Stand 6: Modified independent (Device/Increase time)  with RW   Stand to Sit 6: Modified independent (Device/Increase time)  with RW   Comments --   Ambulation/Gait   Ambulation/Gait Yes   Ambulation/Gait Assistance 4: Min guard;4: Min assist   Ambulation/Gait Assistance Details No standing rest breaks; min guard -min A required due to posterior lean during RLE advancement during initial 10 steps.   Ambulation Distance (Feet) 60 Feet  x30', then negotiated stairs, thena additional 30'   Assistive device Rolling walker;Other (Comment)  R AFO    Gait Pattern Decreased step length - left;Decreased stride length;Decreased stance time - right;Decreased hip/knee flexion - right;Decreased hip/knee flexion - left;Decreased weight shift to right;Right flexed knee in stance;Left flexed knee in stance;Trunk flexed;Wide base of support;Step-through pattern;Step-to pattern  improved R step length,  foot clearance/placement with AFO   Ambulation Surface Level;Indoor   Stairs Yes   Stairs Assistance 5: Supervision   Stair Management Technique One rail Right;Step to pattern;Sideways   Number of Stairs 4   Height of Stairs 6   Gait  Comments --   Educational psychologist Assistance 5: Water engineer Both upper extremities   Wheelchair Parts Management Supervision/cueing   Distance 115  then 35 ft   Comments Seated rest breaks (< 10 seconds) between w/c propulsion trials. During second trial (x50'), pt performed 180' turn x1 with PT cueing for technique with effective return demo. Noted effective between-session carryover of semi circular technique for w/c propulsion.   Exercises   Exercises --   Other Exercises  --                PT Education - 06/11/15 1302    Education provided Yes   Education Details Iniitated w/c propulsion program to increase endurance; see Pt Instructions for details. PT educated pt on safety strategies to decrease fall risk with postural hypotension; also educated pt on importance of hydration to prevent postural hypotension (as pt drinks mostly caffienated sweet tea at home).    Person(s) Educated Patient;Spouse   Methods Explanation;Demonstration;Handout   Comprehension Verbalized understanding          PT Short Term Goals - 06/02/15 1624    PT SHORT TERM GOAL #1   Title Pt will independently perform HEP to maximize functional gains made in PT.    (Target date: 06/04/15)   Baseline Met 4/10.   Status Achieved   PT SHORT TERM GOAL #2   Title Orthotist will be present for PT session to assess/address if pt is appropriate candidate for R AFO to improve safety with short-distance household ambulation.   (Target date: 06/04/15)   Baseline Orthotist present for session on 4/10.   Patient scheduled to receive personal R AFO week of 4/24.   Status Achieved   PT SHORT TERM GOAL #3   Title  Schedule consult for custom w/c to improve positioning, postural alignment, contracture prevention, preservation of skin integrity, and improved breath support.    (Target date: 06/04/15)   Baseline Custom wheelchair assessment occurred on 4/18. Pt scheduled to receive new ultra light custom wheelchair 6-10 weeks from assessment.     Status Achieved   PT SHORT TERM GOAL #4   Title Pt will perform sit > stand from w/c with min A using LRAD to indicate increased safety with functional transfers.    (Target date: 06/04/15)   Baseline Met 4/3.   Status Achieved   PT SHORT TERM GOAL #5   Title Pt will ambulate x20' with Min A using LRAD with SpO2 > 89% to indicate improved activity tolerance.    (Target date: 06/04/15)   Baseline Met 4/3.   Status Achieved   PT SHORT TERM GOAL #6   Title Pt will negotiate 6 stairs with single R rail to with Min A with SpO2 > 89% to indicate increased pt safety using primary home entrance.  (Target date: 06/04/15)   Baseline Met 4/20.   Status Achieved           PT Long Term Goals - 06/01/15 1124    PT LONG TERM GOAL #1   Title Pt will perform sit > stand from w/c with mod I using LRAD to indicate increased safety with functional transfers.    (Target date: 07/02/15)   Baseline 4/21: REVISED from supervision to Mod I due to pt progress.   Status Revised   PT LONG TERM GOAL #2   Title Pt will ambulate 150' with Mod I using LRAD with SpO2 > 89% to indicate  increased safety with household ambulation.      (Target date: 07/02/15)   Baseline 4/21: REVISED from 76' to 150' and from supervision to mod I due to pt progress.   Status Revised   PT LONG TERM GOAL #3   Title Pt will negotiate 6 stairs with single R rail to with mod I with SpO2 > 89% to indicate increased pt safety using primary home entrance.    (Target date: 07/02/15)   Baseline 4/21: REVISED from supervision to mod I due to pt progress.   Status Revised   PT LONG TERM GOAL #4   Title Pt will perform  self-propulsion of w/c > 150' over level, indoor surfaces with mod I and SpO2 > 89% to indicate increased independence with w/c mobility.  (Target date: 07/02/15)   Status On-going   PT LONG TERM GOAL #5   Title --   Status --               Plan - 06/11/15 1310    Clinical Impression Statement Session focused on gait training, w/c training, increasing endurance, and addressing pt-reported dizziness today. PT did note posterior lean during initial 10 steps of gait. BP did decrease from 147/83 in seated to 118/76 in standing; however, pt did not report symptoms of dizziness/lightheadedness at this time. PT did provide education on postural hypotension, including prevention (emphasis on hydration) and fall prevention strategies.    Rehab Potential Fair   Clinical Impairments Affecting Rehab Potential complex medical history   PT Frequency 2x / week   PT Duration 8 weeks   PT Treatment/Interventions ADLs/Self Care Home Management;DME Instruction;Stair training;Gait training;Neuromuscular re-education;Functional mobility training;Therapeutic activities;Therapeutic exercise;Balance training;Orthotic Fit/Training;Patient/family education;Wheelchair mobility training;Vestibular   PT Next Visit Plan Focus on quality/stability of transfers, gait, and stair negotiation. If quality, increase distance, to pt tolerance.   Consulted and Agree with Plan of Care Patient;Family member/caregiver   Family Member Consulted wife, [name]      Patient will benefit from skilled therapeutic intervention in order to improve the following deficits and impairments:  Abnormal gait, Cardiopulmonary status limiting activity, Decreased activity tolerance, Decreased balance, Decreased endurance, Decreased skin integrity, Decreased strength, Difficulty walking, Impaired sensation, Postural dysfunction, Pain, Dizziness  Visit Diagnosis: Other abnormalities of gait and mobility  Unsteadiness on feet  Foot drop,  right  Muscle weakness (generalized)     Problem List Patient Active Problem List   Diagnosis Date Noted  . Tinea cruris 06/05/2015  . Sepsis ([place]) 04/01/2015  . CAP (community acquired pneumonia) 04/01/2015  . Cancer of lower lobe of right lung ([place]) 04/01/2015  . Chronic respiratory failure with hypoxia ([place]) 04/01/2015  . Cough 03/08/2015  . Dysphagia 02/01/2015  . Primary cancer of right lower lobe of lung ([place]) 12/05/2014  . Lung mass   . Pressure sore on elbow 05/10/2014  . Abscess of buttock, right 04/12/2014  . Recurrent aspiration pneumonia ([place]) 11/16/2013  . Toenail fungus 11/06/2013  . HCAP (healthcare-associated pneumonia) 10/23/2013  . Hypotension 10/16/2013  . 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 CAD (coronary artery disease) 07/27/2013  . Thoracic compression fracture ([place]) 06/21/2013  . Thoracic spine fracture ([place]) 06/21/2013  . Giant comedone 11/04/2012  . Nausea with vomiting 07/24/2012  . Unspecified constipation 07/24/2012  . Abdominal pain, acute, epigastric 07/22/2012  . Hyponatremia 07/22/2012  . Chronic lower back pain 07/22/2012  . Transaminitis 07/22/2012  . Prostate cancer ([place]) 04/22/2012  . Routine general medical examination at a health care facility 04/22/2012  .  Pseudomonas infection 11/24/2011  . E coli bacteremia 11/24/2011  . Postoperative infection 11/24/2011  . Anemia associated with acute blood loss   . Bacteremia due to Pseudomonas 10/04/2011  . Sepsis(995.91) 10/02/2011  . 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 Falls frequently 07/31/2011  . COPD  GOLD III with reversibility  01/14/2011  . Allergic rhinitis, seasonal 12/18/2010  . Angioedema 10/31/2010  . Neoplasm of uncertain behavior of skin 05/07/2010  . MYOCARDIAL INFARCTION 02/20/2010  . C V A / STROKE 02/20/2010  . CANDIDIASIS 02/13/2010  . Hyperlipidemia 02/13/2010  . Bipolar disorder ([place]) 02/13/2010  . DEPRESSION 02/13/2010  . Essential hypertension 02/13/2010  . TREMOR  02/13/2010  . PROSTATE CANCER, HX OF 02/13/2010  . Hx of TIA (transient ischemic attack) and stroke 02/13/2010  . Chronic daily headache 02/13/2010   Billie Ruddy, PT, DPT [facility] [address] [place], [place], [address] Phone: [phone]   Fax:  [phone] 06/11/2015, 1:16 PM  [facility] [address] [place], [place], [address] Phone: [phone]   Fax:  [phone]  Name: Malik Zuniga MRN: [id number] Date of Birth: 07/21/1945

## 2015-06-12 ENCOUNTER — Telehealth: Payer: Self-pay | Admitting: Family Medicine

## 2015-06-12 NOTE — Telephone Encounter (Signed)
Pt called to let Dr [name] know that the cream you gave him for his rash worked great and he wanted to say a BIG thank you!

## 2015-06-13 ENCOUNTER — Ambulatory Visit: Payer: Medicare Other | Admitting: Physical Therapy

## 2015-06-13 DIAGNOSIS — M6281 Muscle weakness (generalized): Secondary | ICD-10-CM | POA: Diagnosis not present

## 2015-06-13 DIAGNOSIS — R2681 Unsteadiness on feet: Secondary | ICD-10-CM

## 2015-06-13 DIAGNOSIS — R2689 Other abnormalities of gait and mobility: Secondary | ICD-10-CM | POA: Diagnosis not present

## 2015-06-13 DIAGNOSIS — M21371 Foot drop, right foot: Secondary | ICD-10-CM | POA: Diagnosis not present

## 2015-06-13 NOTE — Therapy (Signed)
[facility] [address] [place], [place], [address] Phone: [phone]   Fax:  [phone]  Physical Therapy Treatment  Patient Details  Name: Malik Zuniga MRN: [id number] Date of Birth: 1945-06-25 Referring Provider: Simonne Maffucci, MD  Encounter Date: 06/13/2015      PT End of Session - 06/13/15 1251    Visit Number 9   Number of Visits 17   Date for PT Re-Evaluation 07/06/15   Authorization Type Medicare primary; BCBS secondary - G Codes and PN required every 10 visits   PT Start Time 1106   PT Stop Time 1147   PT Time Calculation (min) 41 min   Equipment Utilized During Treatment Gait belt   Activity Tolerance Patient limited by fatigue  Ambulation limited by dizziness   Behavior During Therapy [facility] for tasks assessed/performed      Past Medical History  Diagnosis Date  . Hyperlipidemia     takes Simvastatin daily  . Tremor   . History of prostate cancer   . On home O2   . Neuromuscular disorder ([place]) 1998    right carpal tunnel release  . Anemia associated with acute blood loss   . GERD (gastroesophageal reflux disease)     takes Omeprazole daily  . Hypertension     takes Metoprolol daily as well as Hyzaar  . Constipation     takes Colace daily as well as Miralax  . Anxiety     takes Valium daily  . Depression     takes Cymbalta daily  . Emphysema of lung (HCC)     Albuterol as needed;Symbicort daily and Singulair at bedtime  . Emphysema   . Myocardial infarction ([place]) 04/1998  . Coronary artery disease   . Asthma   . Shortness of breath     with exertion  . History of bronchitis   . Aspiration pneumonia ([place]) 2010  . Headache, chronic daily   . History of migraine     last migraine a couple of days ago;takes Excedrin Migraine  . Chronic back pain     compression fracture  . History of colon polyps   . Mood change ([place])     after Brain surgery mood changed and was placed on Depakote   . Insomnia     takes Benadryl nightly  . 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 Prostate cancer ([place]) 2004  . Lung cancer [facility])     Past Surgical History  Procedure Laterality Date  . Cholecystectomy  1996  . 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 Annette Stable  . Vascular stent  2000    Dr. Rollene Fare; he reports cardiac stent, but denies stent for PAD 06/15/13  . Hand surgery  1989    crushed thumb, Dr. Fredna Dow  . Ulnar nerve repair  1998    left arm, Dr. Fredna Dow  . Gastrostomy w/ feeding tube  2008    Dr. Watt Climes; only had for a few months  . Prostatectomy  04/2001    removal of prostate cancer, Dr. Janice Norrie  . Coronary angioplasty with stent placement  04/1998  . Carpal tunnel release  1998    right  . Spine surgery    . 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 Back surgery  08/2004; 02/2005; 04/2006; 06/2007; 7/20101    all by Dr. Annette Stable  . Esophagogastroduodenoscopy N/A 07/23/2012    Procedure: ESOPHAGOGASTRODUODENOSCOPY (EGD);  Surgeon: Caryl Bis  Magod, MD;  Location: [facility];  Service: Endoscopy;  Laterality: N/A;  buccini /ja  . Colonoscopy    . Back surgery  05/2013  . Dg biopsy lung      There were no vitals filed for this visit.      Subjective Assessment - 06/13/15 1110    Subjective "I'm continuing to feel very unstable when I walk. I feel like I absolutely have to use my walker and [name] practically has to walk me. I'm very concerned." Pt doesn't think this is due to lightheadedness. Pt hsa difficulty describing the origin of instability.   Patient is accompained by: Family member  wife, [name]   Pertinent History * Monitor SpO2 closely *   PMH: PNA, lung CA (diagnosed 11/2014; currently no radiation or chemo), COPD, CVA (1998),  aneurysm clipping (1999), kyphoscoliosis, thoracolumbar fusion x8 levels, HTN, HLD, prostate cancer (2004), MI (2000), depression, anxiety, bipolar disorder   Patient Stated Goals "I'd like to keep working on the stairs, so I feel more in control. I'd like  to keep working on my endurance, and to be able to push my wheelchair further without getting so tired."   Currently in Pain? Yes   Pain Score 3    Pain Location Back   Pain Orientation Lower   Pain Descriptors / Indicators Aching   Pain Type Chronic pain   Aggravating Factors  standing and walking   Pain Relieving Factors pain medication   Effect of Pain on Daily Activities limits standing and walking tolerance   Multiple Pain Sites No                Vestibular Assessment - 06/13/15 0001    Orthostatics   BP supine (x 5 minutes) 131/77 mmHg  spO2 97%   HR supine (x 5 minutes) 84   BP standing (after 1 minute) 96/69 mmHg  "unsteady", per pt   HR standing (after 1 minute) 97                 [facility] Adult PT Treatment/Exercise - 06/13/15 0001    Ambulation/Gait   Ambulation/Gait Yes   Ambulation/Gait Assistance 4: Min guard   Ambulation/Gait Assistance Details Gait x55' with single 180 degree turn with RW; cueing for safe technique during turning (maintaining walker at safe distance; small movements of RW) with effective within-session carryover. Pt reports feeling "out of control" and unsteady, as he has been describing when walking at home with wife.    Ambulation Distance (Feet) 55 Feet   Assistive device Rolling walker;Other (Comment)  R AFO    Gait Pattern Decreased step length - left;Decreased stride length;Decreased stance time - right;Decreased hip/knee flexion - right;Decreased hip/knee flexion - left;Decreased weight shift to right;Right flexed knee in stance;Left flexed knee in stance;Trunk flexed;Wide base of support;Step-through pattern;Step-to pattern  improved R step length, foot clearance/placement with AFO   Ambulation Surface Level;Indoor   Chief Technology Officer Yes   Wheelchair Assistance 5: Supervision;4: Solicitor Both upper extremities   Wheelchair Parts Management Supervision/cueing;Needs  assistance;Other (comment)  (S), cueing for brake management; total A for leg rests   Distance 329  rest  breaks, SpO2 checks at 131' and 228'   Comments SpO2 > 90% and HR < 102 throughout w/c propulsion. Cueing from PT focused on "covering" brakes when going down ramped surface; verbal reminders for semi circular propulsion technique for energy conservation, and for anterior weight shift when ascending ramp with  effective within-session carryover. Recommended pt not go up ramp in w/c wihtout wife directly behind w/c due to no anti-tippers on current w/c. Pt verbalized understanding.   Self-Care   Self-Care Other Self-Care Comments   Other Self-Care Comments  Discussed strategies for preventing, managing orthostatic hypotension with focus on hydration, postural adjustment, compression garments, and performance of LE therex. Emphasized importance of static standing > 10 seconds prior to initiating ambulating to decrease fall risk. Pt verbalized understanding.                PT Education - 06/13/15 1250    Education provided Yes   Education Details Postural hypotension prevention and safety strategies. Will contact cardiologist, per pt request.   Person(s) Educated Patient;Spouse   Methods Explanation;Demonstration   Comprehension Verbalized understanding;Returned demonstration          PT Short Term Goals - 06/02/15 1624    PT SHORT TERM GOAL #1   Title Pt will independently perform HEP to maximize functional gains made in PT.    (Target date: 06/04/15)   Baseline Met 4/10.   Status Achieved   PT SHORT TERM GOAL #2   Title Orthotist will be present for PT session to assess/address if pt is appropriate candidate for R AFO to improve safety with short-distance household ambulation.   (Target date: 06/04/15)   Baseline Orthotist present for session on 4/10.   Patient scheduled to receive personal R AFO week of 4/24.   Status Achieved   PT SHORT TERM GOAL #3   Title Schedule consult  for custom w/c to improve positioning, postural alignment, contracture prevention, preservation of skin integrity, and improved breath support.    (Target date: 06/04/15)   Baseline Custom wheelchair assessment occurred on 4/18. Pt scheduled to receive new ultra light custom wheelchair 6-10 weeks from assessment.     Status Achieved   PT SHORT TERM GOAL #4   Title Pt will perform sit > stand from w/c with min A using LRAD to indicate increased safety with functional transfers.    (Target date: 06/04/15)   Baseline Met 4/3.   Status Achieved   PT SHORT TERM GOAL #5   Title Pt will ambulate x20' with Min A using LRAD with SpO2 > 89% to indicate improved activity tolerance.    (Target date: 06/04/15)   Baseline Met 4/3.   Status Achieved   PT SHORT TERM GOAL #6   Title Pt will negotiate 6 stairs with single R rail to with Min A with SpO2 > 89% to indicate increased pt safety using primary home entrance.  (Target date: 06/04/15)   Baseline Met 4/20.   Status Achieved           PT Long Term Goals - 06/01/15 1124    PT LONG TERM GOAL #1   Title Pt will perform sit > stand from w/c with mod I using LRAD to indicate increased safety with functional transfers.    (Target date: 07/02/15)   Baseline 4/21: REVISED from supervision to Mod I due to pt progress.   Status Revised   PT LONG TERM GOAL #2   Title Pt will ambulate 150' with Mod I using LRAD with SpO2 > 89% to indicate increased safety with household ambulation.      (Target date: 07/02/15)   Baseline 4/21: REVISED from 25' to 150' and from supervision to mod I due to pt progress.   Status Revised   PT LONG TERM GOAL #3   Title  Pt will negotiate 6 stairs with single R rail to with mod I with SpO2 > 89% to indicate increased pt safety using primary home entrance.    (Target date: 07/02/15)   Baseline 4/21: REVISED from supervision to mod I due to pt progress.   Status Revised   PT LONG TERM GOAL #4   Title Pt will perform self-propulsion of  w/c > 150' over level, indoor surfaces with mod I and SpO2 > 89% to indicate increased independence with w/c mobility.  (Target date: 07/02/15)   Status On-going   PT LONG TERM GOAL #5   Title --   Status --               Plan - 06/13/15 1253    Clinical Impression Statement Session focused on w/c propulsion due to pt-perceived "unsteadiness" when standing and walking. Vital signs as follows: supine BP 131/77, HR 84; standing BP 96/69, HR 97 and symptomatic. Therefore, remainder of session focused on w/c propulsion in commnuity environment. Will send cardiologist note to notify of postural hypotension, per pt request.    Rehab Potential Fair   Clinical Impairments Affecting Rehab Potential complex medical history   PT Frequency 2x / week   PT Duration 8 weeks   PT Treatment/Interventions ADLs/Self Care Home Management;DME Instruction;Stair training;Gait training;Neuromuscular re-education;Functional mobility training;Therapeutic activities;Therapeutic exercise;Balance training;Orthotic Fit/Training;Patient/family education;Wheelchair mobility training;Vestibular   PT Next Visit Plan GOCDES and progress note   Consulted and Agree with Plan of Care Patient;Family member/caregiver   Family Member Consulted wife, [name]      Patient will benefit from skilled therapeutic intervention in order to improve the following deficits and impairments:  Abnormal gait, Cardiopulmonary status limiting activity, Decreased activity tolerance, Decreased balance, Decreased endurance, Decreased skin integrity, Decreased strength, Difficulty walking, Impaired sensation, Postural dysfunction, Pain, Dizziness  Visit Diagnosis: Other abnormalities of gait and mobility  Unsteadiness on feet  Muscle weakness (generalized)     Problem List Patient Active Problem List   Diagnosis Date Noted  . Tinea cruris 06/05/2015  . Sepsis ([place]) 04/01/2015  . CAP (community acquired pneumonia) 04/01/2015  . Cancer  of lower lobe of right lung ([place]) 04/01/2015  . Chronic respiratory failure with hypoxia ([place]) 04/01/2015  . Cough 03/08/2015  . Dysphagia 02/01/2015  . Primary cancer of right lower lobe of lung ([place]) 12/05/2014  . Lung mass   . Pressure sore on elbow 05/10/2014  . Abscess of buttock, right 04/12/2014  . Recurrent aspiration pneumonia ([place]) 11/16/2013  . Toenail fungus 11/06/2013  . HCAP (healthcare-associated pneumonia) 10/23/2013  . Hypotension 10/16/2013  . 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 CAD (coronary artery disease) 07/27/2013  . Thoracic compression fracture ([place]) 06/21/2013  . Thoracic spine fracture ([place]) 06/21/2013  . Giant comedone 11/04/2012  . Nausea with vomiting 07/24/2012  . Unspecified constipation 07/24/2012  . Abdominal pain, acute, epigastric 07/22/2012  . Hyponatremia 07/22/2012  . Chronic lower back pain 07/22/2012  . Transaminitis 07/22/2012  . Prostate cancer ([place]) 04/22/2012  . Routine general medical examination at a health care facility 04/22/2012  . Pseudomonas infection 11/24/2011  . E coli bacteremia 11/24/2011  . Postoperative infection 11/24/2011  . Anemia associated with acute blood loss   . Bacteremia due to Pseudomonas 10/04/2011  . Sepsis(995.91) 10/02/2011  . 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 Falls frequently 07/31/2011  . COPD  GOLD III with reversibility  01/14/2011  . Allergic rhinitis, seasonal 12/18/2010  . Angioedema 10/31/2010  . Neoplasm of uncertain behavior of skin 05/07/2010  .  MYOCARDIAL INFARCTION 02/20/2010  . C V A / STROKE 02/20/2010  . CANDIDIASIS 02/13/2010  . Hyperlipidemia 02/13/2010  . Bipolar disorder ([place]) 02/13/2010  . DEPRESSION 02/13/2010  . Essential hypertension 02/13/2010  . TREMOR 02/13/2010  . PROSTATE CANCER, HX OF 02/13/2010  . Hx of TIA (transient ischemic attack) and stroke 02/13/2010  . Chronic daily headache 02/13/2010    Billie Ruddy, PT, DPT [facility] [address] [place], [place], [address] Phone: [phone]   Fax:  [phone] 06/13/2015, 6:00 PM   Name: Malik Zuniga MRN: [id number] Date of Birth: 1945-09-24

## 2015-06-14 ENCOUNTER — Other Ambulatory Visit: Payer: Self-pay | Admitting: General Practice

## 2015-06-14 MED ORDER — PRIMIDONE 250 MG PO TABS: ORAL_TABLET | ORAL | Status: DC

## 2015-06-14 MED ORDER — METOPROLOL SUCCINATE ER 50 MG PO TB24: ORAL_TABLET | ORAL | Status: DC

## 2015-06-14 MED ORDER — MONTELUKAST SODIUM 10 MG PO TABS: 10.0000 mg | ORAL_TABLET | Freq: Every day | ORAL | Status: DC

## 2015-06-14 NOTE — Telephone Encounter (Signed)
This encounter was created in error - please disregard.

## 2015-06-14 NOTE — Patient Outreach (Signed)
[facility] Care Management  [place] 06/07/2015  Malik Zuniga [birth year] [id number]  Subjective: RN Health Coach telephone call to patient.  [employer] compliance verified. Member stated he is breathing much better. Member stated in therapy the  O2 sensor read in the 90's. Member stated he would like to have more ensure coupons for supplemental nutrition. Per member his rash is getting better with the medication the physician had prescribed.. Member is aware of zones and is in the green to yellow zone today . Shortness of breath on exertion. Member has agreed to follow up outreach calls.    Objective:   Encounter Medications:  Outpatient Encounter Prescriptions as of 06/07/2015  Medication Sig Note  . ARIPiprazole (ABILIFY) 10 MG tablet Take 5 mg by mouth daily.    . Ascorbic Acid (VITAMIN C) 1000 MG tablet Take 1,000 mg by mouth daily.   Marland Kitchen aspirin-acetaminophen-caffeine (EXCEDRIN MIGRAINE) 250-250-65 MG per tablet Take 2 tablets by mouth every 6 (six) hours as needed. For headaches 07/03/2012: .   . budesonide (PULMICORT) 0.25 MG/2ML nebulizer solution Take 4 mLs (0.5 mg total) by nebulization 2 (two) times daily.   . cetirizine (ZYRTEC) 10 MG tablet TAKE 1 TABLET BY MOUTH DAILY.   . cholecalciferol (VITAMIN D) 1000 UNITS tablet Take 1,000 Units by mouth every morning.    . clotrimazole-betamethasone (LOTRISONE) cream Apply 1 application topically 2 (two) times daily.   Marland Kitchen dextromethorphan-guaiFENesin (MUCINEX DM) 30-600 MG 12hr tablet Take 1 tablet by mouth 2 (two) times daily.   . diazepam (VALIUM) 5 MG tablet Take 5 mg by mouth 3 (three) times daily. scheduled   . divalproex (DEPAKOTE) 500 MG 24 hr tablet Take 1,000 mg by mouth at bedtime.    . docusate sodium (COLACE) 100 MG capsule Take 200 mg by mouth at bedtime.    . DULoxetine (CYMBALTA) 60 MG capsule Take 120 mg by mouth daily.    . fluconazole (DIFLUCAN) 100 MG tablet Take 1 tablet (100 mg total) by mouth  daily.   . fluticasone (FLONASE) 50 MCG/ACT nasal spray PLACE 2 SPRAYS INTO BOTH NOSTRILS DAILY. (Patient taking differently: PLACE 2 SPRAYS INTO BOTH NOSTRILS DAILY as needed for allergies)   . HYDROcodone-acetaminophen (NORCO) 10-325 MG per tablet Take 1 tablet by mouth every 6 (six) hours as needed for moderate pain. for pain 12/01/2014: .   . ipratropium-albuterol (DUONEB) 0.5-2.5 (3) MG/3ML SOLN Take 3 mLs by nebulization every 6 (six) hours.   Marland Kitchen losartan (COZAAR) 50 MG tablet Take 1 tablet (50 mg total) by mouth daily.   Marland Kitchen NITROSTAT 0.4 MG SL tablet PLACE 1 TABLET (0.4 MG TOTAL) UNDER THE TONGUE EVERY 5 (FIVE) MINUTES AS NEEDED FOR CHEST PAIN.   Marland Kitchen omeprazole (PRILOSEC) 40 MG capsule Take 40 mg by mouth 2 (two) times daily.    . polyethylene glycol (MIRALAX / GLYCOLAX) packet Take 17 g by mouth 3 (three) times daily as needed for moderate constipation. For constipation   . predniSONE (DELTASONE) 10 MG tablet 4 tabs for 2 days, then 3 tabs for 2 days, 2 tabs for 2 days, then 1 tab daily   . promethazine (PHENERGAN) 25 MG tablet Take 25 mg by mouth every 6 (six) hours as needed. Reported on 04/06/2015 12/01/2014: .   . simvastatin (ZOCOR) 40 MG tablet TAKE 1 TABLET (40 MG TOTAL) BY MOUTH EVERY EVENING.   Marland Kitchen tiZANidine (ZANAFLEX) 4 MG tablet TAKE 1 TABLET BY MOUTH EVERY 8 HOURS AS NEEDED FOR MUSCLE  SPASMS.   Marland Kitchen Zinc 50 MG TABS Take 50 mg by mouth daily.    . [DISCONTINUED] metoprolol succinate (TOPROL-XL) 50 MG 24 hr tablet TAKE 1 TABLET (50 MG TOTAL) BY MOUTH EVERY MORNING. TAKE WITH OR IMMEDIATELY FOLLOWING A MEAL.   . [DISCONTINUED] montelukast (SINGULAIR) 10 MG tablet TAKE 1 TABLET BY MOUTH AT BEDTIME   . [DISCONTINUED] primidone (MYSOLINE) 250 MG tablet TAKE 1 TABLET (250 MG TOTAL) BY MOUTH 2 (TWO) TIMES DAILY.    No facility-administered encounter medications on file as of 06/07/2015.    Functional Status:  In your present state of health, do you have any difficulty performing the following  activities: 06/07/2015 04/26/2015  Hearing? N Y  Vision? N N  Difficulty concentrating or making decisions? N Y  Walking or climbing stairs? Y Y  Dressing or bathing? Y Y  Doing errands, shopping? [name]  Preparing Food and eating ? Y Y  Using the Toilet? N N  In the past six months, have you accidently leaked urine? N N  Do you have problems with loss of bowel control? N N  Managing your Medications? Y Y  Managing your Finances? N Y  Housekeeping or managing your Housekeeping? [name]    Fall/Depression Screening: PHQ 2/9 Scores 06/07/2015 04/26/2015 03/30/2015 03/02/2015 12/29/2014 12/11/2014 11/30/2014  PHQ - 2 Score 0 '4 1 1 1 '$ 0 1  PHQ- 9 Score - 17 - - - - -    Assessment:  Patient will continue to benefit from [employer] telephonic outreach for education and support for diabetes self management.  Plan:  RN sent patient additional nutritional coupons RN snet patient educational material on COPD exacerbation RN will follow up outreach within a month.  [facility] Management [phone]

## 2015-06-15 ENCOUNTER — Other Ambulatory Visit: Payer: Self-pay | Admitting: *Deleted

## 2015-06-15 MED ORDER — SIMVASTATIN 40 MG PO TABS: ORAL_TABLET | ORAL | Status: DC

## 2015-06-18 ENCOUNTER — Ambulatory Visit (INDEPENDENT_AMBULATORY_CARE_PROVIDER_SITE_OTHER): Payer: Medicare Other | Admitting: Interventional Cardiology

## 2015-06-18 ENCOUNTER — Encounter: Payer: Self-pay | Admitting: Interventional Cardiology

## 2015-06-18 VITALS — BP 106/68 | HR 87

## 2015-06-18 DIAGNOSIS — J432 Centrilobular emphysema: Secondary | ICD-10-CM | POA: Diagnosis not present

## 2015-06-18 DIAGNOSIS — I5042 Chronic combined systolic (congestive) and diastolic (congestive) heart failure: Secondary | ICD-10-CM

## 2015-06-18 DIAGNOSIS — I2584 Coronary atherosclerosis due to calcified coronary lesion: Secondary | ICD-10-CM

## 2015-06-18 DIAGNOSIS — I2510 Atherosclerotic heart disease of native coronary artery without angina pectoris: Secondary | ICD-10-CM

## 2015-06-18 NOTE — Patient Instructions (Signed)
Medication Instructions:  Your physician recommends that you continue on your current medications as directed. Please refer to the Current Medication list given to you today.   Labwork: None ordered  Testing/Procedures: Your physician has requested that you have a lexiscan myoview. For further information please visit [email or web address]. Please follow instruction sheet, as given.   Follow-Up: Your physician wants you to follow-up in: 1 year with Dr.Smith You will receive a reminder letter in the mail two months in advance. If you don't receive a letter, please call our office to schedule the follow-up appointment.   Any Other Special Instructions Will Be Listed Below (If Applicable).     If you need a refill on your cardiac medications before your next appointment, please call your pharmacy.

## 2015-06-18 NOTE — Progress Notes (Signed)
Cardiology Office Note   Date:  06/18/2015   ID:  Malik Zuniga, DOB Jul 20, 1945, MRN [id number]  PCP:  [name], MD  Cardiologist:  Sinclair Grooms, MD   Chief Complaint  Patient presents with  . Coronary Artery Disease      History of Present Illness: Malik Zuniga is a 70 y.o. male who presents for Bipolar disorder, hypertension, prior myocardial infarction, transient ischemic attacks, COPD, and CAD with prior infarction and transluminal coronary angioplasty with stent in 2000 during acute infarction.  Patient is being managed by Dr. Casimiro Needle for depression. There is a recent request for the patient to be started on Ritalin.'s underlying coronary disease and LV dysfunction, I did not feel this was a good idea.  He is limited by dyspnea and cervical/thoracic/lumbar disc disease that limits mobility.  Discontinued smoking in 2014.  He is concerned that there may be something going on with his heart that we are unaware of.    Past Medical History  Diagnosis Date  . Hyperlipidemia     takes Simvastatin daily  . Tremor   . History of prostate cancer   . On home O2   . Neuromuscular disorder ([place]) 1998    right carpal tunnel release  . Anemia associated with acute blood loss   . GERD (gastroesophageal reflux disease)     takes Omeprazole daily  . Hypertension     takes Metoprolol daily as well as Hyzaar  . Constipation     takes Colace daily as well as Miralax  . Anxiety     takes Valium daily  . Depression     takes Cymbalta daily  . Emphysema of lung (HCC)     Albuterol as needed;Symbicort daily and Singulair at bedtime  . Emphysema   . Myocardial infarction ([place]) 04/1998  . Coronary artery disease   . Asthma   . Shortness of breath     with exertion  . History of bronchitis   . Aspiration pneumonia ([place]) 2010  . Headache, chronic daily   . History of migraine     last migraine a couple of days ago;takes Excedrin Migraine  . Chronic back pain       compression fracture  . History of colon polyps   . Mood change ([place])     after Brain surgery mood changed and was placed on Depakote  . Insomnia     takes Benadryl nightly  . 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 Prostate cancer ([place]) 2004  . Lung cancer [facility])     Past Surgical History  Procedure Laterality Date  . Cholecystectomy  1996  . 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 Annette Stable  . Vascular stent  2000    Dr. Rollene Fare; he reports cardiac stent, but denies stent for PAD 06/15/13  . Hand surgery  1989    crushed thumb, Dr. Fredna Dow  . Ulnar nerve repair  1998    left arm, Dr. Fredna Dow  . Gastrostomy w/ feeding tube  2008    Dr. Watt Climes; only had for a few months  . Prostatectomy  04/2001    removal of prostate cancer, Dr. Janice Norrie  . Coronary angioplasty with stent placement  04/1998  . Carpal tunnel release  1998    right  . Spine surgery    . 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 Back surgery  08/2004; 02/2005; 04/2006; 06/2007; 7/20101  all by Dr. Annette Stable  . Esophagogastroduodenoscopy N/A 07/23/2012    Procedure: ESOPHAGOGASTRODUODENOSCOPY (EGD);  Surgeon: Jeryl Columbia, MD;  Location: [facility] ENDOSCOPY;  Service: Endoscopy;  Laterality: N/A;  buccini /ja  . Colonoscopy    . Back surgery  05/2013  . Dg biopsy lung       Current Outpatient Prescriptions  Medication Sig Dispense Refill  . ARIPiprazole (ABILIFY) 10 MG tablet Take 5 mg by mouth daily.     . Ascorbic Acid (VITAMIN C) 1000 MG tablet Take 1,000 mg by mouth daily.    Marland Kitchen aspirin-acetaminophen-caffeine (EXCEDRIN MIGRAINE) 250-250-65 MG per tablet Take 2 tablets by mouth every 6 (six) hours as needed. For headaches    . budesonide (PULMICORT) 0.25 MG/2ML nebulizer solution Take 4 mLs (0.5 mg total) by nebulization 2 (two) times daily. 60 mL 3  . cetirizine (ZYRTEC) 10 MG tablet TAKE 1 TABLET BY MOUTH DAILY. 30 tablet 10  . cholecalciferol (VITAMIN D) 1000 UNITS tablet Take 1,000 Units by mouth every morning.     .  clotrimazole-betamethasone (LOTRISONE) cream Apply 1 application topically 2 (two) times daily. 45 g 1  . dextromethorphan-guaiFENesin (MUCINEX DM) 30-600 MG 12hr tablet Take 1 tablet by mouth 2 (two) times daily.    . diazepam (VALIUM) 5 MG tablet Take 5 mg by mouth 3 (three) times daily. scheduled    . divalproex (DEPAKOTE) 500 MG 24 hr tablet Take 1,000 mg by mouth at bedtime.     . docusate sodium (COLACE) 100 MG capsule Take 200 mg by mouth at bedtime.     . DULoxetine (CYMBALTA) 60 MG capsule Take 120 mg by mouth daily.     . fluconazole (DIFLUCAN) 100 MG tablet Take 1 tablet (100 mg total) by mouth daily. 7 tablet 0  . fluticasone (FLONASE) 50 MCG/ACT nasal spray PLACE 2 SPRAYS INTO BOTH NOSTRILS DAILY. (Patient taking differently: PLACE 2 SPRAYS INTO BOTH NOSTRILS DAILY as needed for allergies) 16 g 2  . HYDROcodone-acetaminophen (NORCO) 10-325 MG per tablet Take 1 tablet by mouth every 6 (six) hours as needed for moderate pain. for pain  0  . ipratropium-albuterol (DUONEB) 0.5-2.5 (3) MG/3ML SOLN Take 3 mLs by nebulization every 6 (six) hours. 360 mL 3  . losartan (COZAAR) 50 MG tablet Take 1 tablet (50 mg total) by mouth daily. 30 tablet 6  . metoprolol succinate (TOPROL-XL) 50 MG 24 hr tablet TAKE 1 TABLET (50 MG TOTAL) BY MOUTH EVERY MORNING. TAKE WITH OR IMMEDIATELY FOLLOWING A MEAL. 90 tablet 1  . montelukast (SINGULAIR) 10 MG tablet Take 1 tablet (10 mg total) by mouth at bedtime. 90 tablet 1  . NITROSTAT 0.4 MG SL tablet PLACE 1 TABLET (0.4 MG TOTAL) UNDER THE TONGUE EVERY 5 (FIVE) MINUTES AS NEEDED FOR CHEST PAIN. 25 tablet 1  . omeprazole (PRILOSEC) 40 MG capsule Take 40 mg by mouth 2 (two) times daily.     . polyethylene glycol (MIRALAX / GLYCOLAX) packet Take 17 g by mouth 3 (three) times daily as needed for moderate constipation. For constipation    . predniSONE (DELTASONE) 20 MG tablet Take 20 mg by mouth daily with breakfast.    . primidone (MYSOLINE) 250 MG tablet TAKE 1  TABLET (250 MG TOTAL) BY MOUTH 2 (TWO) TIMES DAILY. 180 tablet 1  . promethazine (PHENERGAN) 25 MG tablet Take 25 mg by mouth every 6 (six) hours as needed. Reported on 04/06/2015  1  . simvastatin (ZOCOR) 40 MG tablet TAKE 1 TABLET (40  MG TOTAL) BY MOUTH EVERY EVENING. 90 tablet 3  . tiZANidine (ZANAFLEX) 4 MG tablet TAKE 1 TABLET BY MOUTH EVERY 8 HOURS AS NEEDED FOR MUSCLE SPASMS. 90 tablet 1  . Zinc 50 MG TABS Take 50 mg by mouth daily.      No current facility-administered medications for this visit.    Allergies:   Lisinopril; Lamictal; and Ambien    Social History:  The patient  reports that he quit smoking about 2 years ago. His smoking use included Cigarettes. He has a 78 pack-year smoking history. He has never used smokeless tobacco. He reports that he uses illicit drugs ([place]). He reports that he does not drink alcohol.   Family History:  The patient's family history includes Cancer in his father; Heart disease in his mother.    ROS:  Please see the history of present illness.   Otherwise, review of systems are positive for cough, difficulty with vision, increased appetite, excessive fatigue, leg weakness, balance difficulty, headaches, and rash..   All other systems are reviewed and negative.    PHYSICAL EXAM: VS:  BP 106/68 mmHg  Pulse 87 , BMI There is no weight on file to calculate BMI. GEN: Well nourished, well developed, in no acute distress HEENT: normal Neck: no JVD, carotid bruits, or masses Cardiac: R RR.  There is no murmur, rub, or gallop. There is no edema. Respiratory:  clear to auscultation bilaterally, normal work of breathing. GI: soft, nontender, nondistended, + BS MS: no deformity or atrophy Skin: warm and dry, no rash Neuro:  Strength and sensation are intact Psych: euthymic mood, full affect   EKG:  EKG is not ordered today. The ekg reveals on 04/02/2015, sinus tachycardia and otherwise unremarkable tracing   Recent Labs: 06/05/2015: ALT 10;  BUN 16; Creatinine, Ser 0.72; Hemoglobin 11.8*; Platelets 379.0; Potassium 5.0; Sodium 126*; TSH 0.57    Lipid Panel    Component Value Date/Time   CHOL 249* 06/05/2015 1119   TRIG 54.0 06/05/2015 1119   HDL 114.60 06/05/2015 1119   CHOLHDL 2 06/05/2015 1119   VLDL 10.8 06/05/2015 1119   LDLCALC 124* 06/05/2015 1119   LDLDIRECT 112.3 04/22/2012 1127      Wt Readings from Last 3 Encounters:  05/02/15 160 lb (72.576 kg)  04/26/15 160 lb (72.576 kg)  04/19/15 160 lb 14.4 oz (72.984 kg)      Other studies Reviewed: Additional studies/ records that were reviewed today include: No data concerning myocardial perfusion or LV function over the past 7 years. Last cardiac evaluation done at [facility] cardiology. Results are not available.. The findings include none.    ASSESSMENT AND PLAN:  1. Chronic combined systolic and diastolic heart failure (HCC) No recent LV assessment. No evidence of volume overload. No documentation of most recent prior LV systolic function  2. Coronary artery disease involving native coronary artery of native heart without angina pectoris Asymptomatic.  3. COPD The patient has discontinued tobacco use. He is limited by dyspnea if too active.    Current medicines are reviewed at length with the patient today.  The patient has the following concerns regarding medicines: None.  The following changes/actions have been instituted:    Lexiscan myocardial perfusion study to reassess for evidence of progression of coronary disease and LV function.  Labs/ tests ordered today include:  No orders of the defined types were placed in this encounter.     Disposition:   FU with HS in 1 year  Signed, Olen Pel  Viona Gilmore, MD  06/18/2015 11:00 AM    [facility] [address], [place], [place]  [address] Phone: [phone]; Fax: [phone]

## 2015-06-19 ENCOUNTER — Ambulatory Visit: Payer: Medicare Other | Admitting: Physical Therapy

## 2015-06-19 VITALS — HR 99

## 2015-06-19 DIAGNOSIS — R2681 Unsteadiness on feet: Secondary | ICD-10-CM | POA: Diagnosis not present

## 2015-06-19 DIAGNOSIS — R2689 Other abnormalities of gait and mobility: Secondary | ICD-10-CM | POA: Diagnosis not present

## 2015-06-19 DIAGNOSIS — M21371 Foot drop, right foot: Secondary | ICD-10-CM | POA: Diagnosis not present

## 2015-06-19 DIAGNOSIS — M6281 Muscle weakness (generalized): Secondary | ICD-10-CM | POA: Diagnosis not present

## 2015-06-19 NOTE — Therapy (Signed)
[facility] [address] [place], [place], [address] Phone: [phone]   Fax:  [phone]  Physical Therapy Treatment  Patient Details  Name: Malik Zuniga MRN: [id number] Date of Birth: [birth year] Referring Provider: Simonne Maffucci, MD  Encounter Date: 06/19/2015      PT End of Session - 06/19/15 2023    Visit Number 10   Number of Visits 17   Date for PT Re-Evaluation 07/06/15   Authorization Type Medicare primary; BCBS secondary - G Codes and PN required every 10 visits   PT Start Time 1106   PT Stop Time 1146   PT Time Calculation (min) 40 min   Equipment Utilized During Treatment Gait belt   Activity Tolerance Patient tolerated treatment well   Behavior During Therapy [facility] for tasks assessed/performed      Past Medical History  Diagnosis Date  . Hyperlipidemia     takes Simvastatin daily  . Tremor   . History of prostate cancer   . On home O2   . Neuromuscular disorder ([place]) 1998    right carpal tunnel release  . Anemia associated with acute blood loss   . GERD (gastroesophageal reflux disease)     takes Omeprazole daily  . Hypertension     takes Metoprolol daily as well as Hyzaar  . Constipation     takes Colace daily as well as Miralax  . Anxiety     takes Valium daily  . Depression     takes Cymbalta daily  . Emphysema of lung (HCC)     Albuterol as needed;Symbicort daily and Singulair at bedtime  . Emphysema   . Myocardial infarction ([place]) 04/1998  . Coronary artery disease   . Asthma   . Shortness of breath     with exertion  . History of bronchitis   . Aspiration pneumonia ([place]) 2010  . Headache, chronic daily   . History of migraine     last migraine a couple of days ago;takes Excedrin Migraine  . Chronic back pain     compression fracture  . History of colon polyps   . Mood change ([place])     after Brain surgery mood changed and was placed on Depakote  . Insomnia     takes  Benadryl nightly  . Stroke [facility]) 1998    Brain Aneurysm  . Prostate cancer ([place]) 2004  . Lung cancer [facility])     Past Surgical History  Procedure Laterality Date  . Cholecystectomy  1996  . 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 Annette Stable  . Vascular stent  2000    Dr. Rollene Fare; he reports cardiac stent, but denies stent for PAD 06/15/13  . Hand surgery  1989    crushed thumb, Dr. Fredna Dow  . Ulnar nerve repair  1998    left arm, Dr. Fredna Dow  . Gastrostomy w/ feeding tube  2008    Dr. Watt Climes; only had for a few months  . Prostatectomy  04/2001    removal of prostate cancer, Dr. Janice Norrie  . Coronary angioplasty with stent placement  04/1998  . Carpal tunnel release  1998    right  . Spine surgery    . 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 Back surgery  08/2004; 02/2005; 04/2006; 06/2007; 7/20101    all by Dr. Annette Stable  . Esophagogastroduodenoscopy N/A 07/23/2012    Procedure: ESOPHAGOGASTRODUODENOSCOPY (EGD);  Surgeon: Jeryl Columbia, MD;  Location: [facility]  ENDOSCOPY;  Service: Endoscopy;  Laterality: N/A;  buccini /ja  . Colonoscopy    . Back surgery  05/2013  . Dg biopsy lung      Filed Vitals:   06/19/15 1109 06/19/15 1140  Pulse: 97 99  SpO2: 90% 94%        Subjective Assessment - 06/19/15 1109    Subjective "Man, have I been practicing. I pushed my wheelchair around [place]. It went well, but I was really worn out, and my oxygen was 86% by th time I got back to the car." Pt reports having seen his cardiologist, Dr. Tamala Julian. "I feel like things are getting better. I did fall this week. I was backing up to my chair without my walker." PT denies injury, head trauma. Was able to get up from floor by himself, but did have some difficulty.     Patient is accompained by: --  wife, [name], remained in waiting room   Pertinent History * Monitor SpO2 closely *   PMH: PNA, lung CA (diagnosed 11/2014; currently no radiation or chemo), COPD, CVA (1998),  aneurysm clipping (1999), kyphoscoliosis, thoracolumbar  fusion x8 levels, HTN, HLD, prostate cancer (2004), MI (2000), depression, anxiety, bipolar disorder   Patient Stated Goals "I'd like to keep working on the stairs, so I feel more in control. I'd like to keep working on my endurance, and to be able to push my wheelchair further without getting so tired."   Currently in Pain? Yes   Pain Score 4    Pain Location Back   Pain Descriptors / Indicators Aching   Pain Type Chronic pain   Pain Onset More than a month ago   Pain Frequency Constant   Aggravating Factors  standing and walking   Pain Relieving Factors pain medication   Effect of Pain on Daily Activities limits standing and walking tolerance   Multiple Pain Sites No                         [facility] Adult PT Treatment/Exercise - 06/19/15 0001    Ambulation/Gait   Ambulation/Gait Yes   Ambulation/Gait Assistance 5: Supervision;4: Min guard;4: Min assist;3: Mod assist   Ambulation/Gait Assistance Details Gait x35' with B forearm crutches requiring min to mod A to prevent posterior LOB, multimodal cueing for sequencing gait with crutches. Gait x20' with single R forearm crutch with min guard-min A. Gait x94' with RW and supervision with concurrent obstacle negotiation, turning (90 degrees x2, 180 degrees x2). Using RW, pt performed lateral stepping 2 x5 steps per direction, retro stepping 2 x5 steps with cueing for technique (emphasis on moving walker prior to stepping, as opposed to stepping concurrently with walker movement). Pt gave effective return demo, but required cueing for proper backing up when sitting in w/c in waiting room after final gait trial x81'.   Ambulation Distance (Feet) 94 Feet  and x35', x20', x81'   Assistive device Rolling walker;Other (Comment);R Forearm Crutch;L Forearm Crutch  R AFO    Gait Pattern Decreased step length - left;Decreased stride length;Decreased stance time - right;Decreased hip/knee flexion - right;Decreased hip/knee flexion -  left;Decreased weight shift to right;Right flexed knee in stance;Left flexed knee in stance;Trunk flexed;Step-through pattern  improved R step length, foot clearance/placement with AFO   Ambulation Surface Level;Indoor                PT Education - 06/19/15 2015    Education provided Yes   Education Details  Safe technique for lateral, retro stepping with RW (as in backing up to chair). Reiterated importance of maintaining spO2 > 89% at all times.   Person(s) Educated Patient   Methods Explanation;Verbal cues;Demonstration   Comprehension Verbalized understanding;Returned demonstration;Need further instruction  May need reinforcement of turning, backing up with RW.          PT Short Term Goals - 06/02/15 1624    PT SHORT TERM GOAL #1   Title Pt will independently perform HEP to maximize functional gains made in PT.    (Target date: 06/04/15)   Baseline Met 4/10.   Status Achieved   PT SHORT TERM GOAL #2   Title Orthotist will be present for PT session to assess/address if pt is appropriate candidate for R AFO to improve safety with short-distance household ambulation.   (Target date: 06/04/15)   Baseline Orthotist present for session on 4/10.   Patient scheduled to receive personal R AFO week of 4/24.   Status Achieved   PT SHORT TERM GOAL #3   Title Schedule consult for custom w/c to improve positioning, postural alignment, contracture prevention, preservation of skin integrity, and improved breath support.    (Target date: 06/04/15)   Baseline Custom wheelchair assessment occurred on 4/18. Pt scheduled to receive new ultra light custom wheelchair 6-10 weeks from assessment.     Status Achieved   PT SHORT TERM GOAL #4   Title Pt will perform sit > stand from w/c with min A using LRAD to indicate increased safety with functional transfers.    (Target date: 06/04/15)   Baseline Met 4/3.   Status Achieved   PT SHORT TERM GOAL #5   Title Pt will ambulate x20' with Min A using LRAD  with SpO2 > 89% to indicate improved activity tolerance.    (Target date: 06/04/15)   Baseline Met 4/3.   Status Achieved   PT SHORT TERM GOAL #6   Title Pt will negotiate 6 stairs with single R rail to with Min A with SpO2 > 89% to indicate increased pt safety using primary home entrance.  (Target date: 06/04/15)   Baseline Met 4/20.   Status Achieved           PT Long Term Goals - 06/01/15 1124    PT LONG TERM GOAL #1   Title Pt will perform sit > stand from w/c with mod I using LRAD to indicate increased safety with functional transfers.    (Target date: 07/02/15)   Baseline 4/21: REVISED from supervision to Mod I due to pt progress.   Status Revised   PT LONG TERM GOAL #2   Title Pt will ambulate 150' with Mod I using LRAD with SpO2 > 89% to indicate increased safety with household ambulation.      (Target date: 07/02/15)   Baseline 4/21: REVISED from 14' to 150' and from supervision to mod I due to pt progress.   Status Revised   PT LONG TERM GOAL #3   Title Pt will negotiate 6 stairs with single R rail to with mod I with SpO2 > 89% to indicate increased pt safety using primary home entrance.    (Target date: 07/02/15)   Baseline 4/21: REVISED from supervision to mod I due to pt progress.   Status Revised   PT LONG TERM GOAL #4   Title Pt will perform self-propulsion of w/c > 150' over level, indoor surfaces with mod I and SpO2 > 89% to indicate increased independence with w/c  mobility.  (Target date: 07/02/15)   Status On-going   PT LONG TERM GOAL #5   Title --   Status --               Plan - 06-27-2015 27-Mar-2022    Clinical Impression Statement Pt reporting having sustained fall due to backing up without walking at home. Pt was uninjured. Therefore, majority of current session focused on gait training with RW with emphasis on safe use of RW during turning, obstacle negotiation, lateral stepping, and retro stepping (as in backing up to chair). Pt may require reinforcement of  sequencing/technique in future sessions. Trialed B forearm crutches and single R forearm crutch, per pt request. PT recommending continued use of RW for all mobility at this time; pt in full agreement.    Rehab Potential Fair   Clinical Impairments Affecting Rehab Potential complex medical history   PT Frequency 2x / week   PT Duration 8 weeks   PT Treatment/Interventions ADLs/Self Care Home Management;DME Instruction;Stair training;Gait training;Neuromuscular re-education;Functional mobility training;Therapeutic activities;Therapeutic exercise;Balance training;Orthotic Fit/Training;Patient/family education;Wheelchair mobility training;Vestibular   PT Next Visit Plan Address standing balance. Revisit turning and backing up with RW to ensure pt carryover.    Consulted and Agree with Plan of Care Patient      Patient will benefit from skilled therapeutic intervention in order to improve the following deficits and impairments:  Abnormal gait, Cardiopulmonary status limiting activity, Decreased activity tolerance, Decreased balance, Decreased endurance, Decreased skin integrity, Decreased strength, Difficulty walking, Impaired sensation, Postural dysfunction, Pain, Dizziness  Visit Diagnosis: Other abnormalities of gait and mobility  Unsteadiness on feet       G-Codes - Jun 27, 2015 03-27-12    Functional Assessment Tool Used Ambulates up to 107' with SpO2 > 90%   Functional Limitation Mobility: Walking and moving around   Mobility: Walking and Moving Around Current Status [phone]) At least 40 percent but less than 60 percent impaired, limited or restricted   Mobility: Walking and Moving Around Goal Status [phone]) At least 20 percent but less than 40 percent impaired, limited or restricted     Physical Therapy Progress Note  Dates of Reporting Period: 05/07/15 to Jun 27, 2015  Objective Reports of Subjective Statement: See Subjective section above.  Objective Measurements: See goals and goal statuses  above.  Goal Update: LTG's for transfers, ambulation, and stair negotiation revised due to pt progress.  Plan: Continue to address LTG's stated above.  Reason Skilled Services are Required: To maximize stability/independence with functional mobility and decrease fall risk.      Problem List Patient Active Problem List   Diagnosis Date Noted  . Tinea cruris 06/05/2015  . Sepsis ([place]) 04/01/2015  . CAP (community acquired pneumonia) 04/01/2015  . Cancer of lower lobe of right lung ([place]) 04/01/2015  . Chronic respiratory failure with hypoxia ([place]) 04/01/2015  . Cough 03/08/2015  . Dysphagia 02/01/2015  . Primary cancer of right lower lobe of lung ([place]) 12/05/2014  . Lung mass   . Pressure sore on elbow 05/10/2014  . Abscess of buttock, right 04/12/2014  . Recurrent aspiration pneumonia ([place]) 11/16/2013  . Toenail fungus 11/06/2013  . HCAP (healthcare-associated pneumonia) 10/23/2013  . Hypotension 10/16/2013  . 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 CAD (coronary artery disease) 07/27/2013  . Thoracic compression fracture ([place]) 06/21/2013  . Thoracic spine fracture ([place]) 06/21/2013  . Giant comedone 11/04/2012  . Nausea with vomiting 07/24/2012  . Unspecified constipation 07/24/2012  . Abdominal pain, acute, epigastric 07/22/2012  . Hyponatremia 07/22/2012  .  Chronic lower back pain 07/22/2012  . Transaminitis 07/22/2012  . Prostate cancer ([place]) 04/22/2012  . Routine general medical examination at a health care facility 04/22/2012  . Pseudomonas infection 11/24/2011  . E coli bacteremia 11/24/2011  . Postoperative infection 11/24/2011  . Anemia associated with acute blood loss   . Bacteremia due to Pseudomonas 10/04/2011  . Sepsis(995.91) 10/02/2011  . 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 Falls frequently 07/31/2011  . COPD  GOLD III with reversibility  01/14/2011  . Allergic rhinitis, seasonal 12/18/2010  . Angioedema 10/31/2010  . Neoplasm of uncertain behavior of skin  05/07/2010  . MYOCARDIAL INFARCTION 02/20/2010  . C V A / STROKE 02/20/2010  . CANDIDIASIS 02/13/2010  . Hyperlipidemia 02/13/2010  . Bipolar disorder ([place]) 02/13/2010  . DEPRESSION 02/13/2010  . Essential hypertension 02/13/2010  . TREMOR 02/13/2010  . PROSTATE CANCER, HX OF 02/13/2010  . Hx of TIA (transient ischemic attack) and stroke 02/13/2010  . Chronic daily headache 02/13/2010    Billie Ruddy, PT, DPT [facility] [address] [place], [place], [address] Phone: [phone]   Fax:  [phone] 06/19/2015, 8:29 PM  Name: Malik Zuniga MRN: [id number] Date of Birth: Apr 29, 1945

## 2015-06-20 ENCOUNTER — Ambulatory Visit: Payer: Medicare Other | Admitting: Interventional Cardiology

## 2015-06-20 ENCOUNTER — Ambulatory Visit: Payer: Medicare Other | Admitting: Physical Therapy

## 2015-06-20 DIAGNOSIS — S22000D Wedge compression fracture of unspecified thoracic vertebra, subsequent encounter for fracture with routine healing: Secondary | ICD-10-CM | POA: Diagnosis not present

## 2015-06-21 ENCOUNTER — Encounter: Payer: Self-pay | Admitting: Pulmonary Disease

## 2015-06-21 ENCOUNTER — Ambulatory Visit: Payer: Medicare Other | Admitting: Physical Therapy

## 2015-06-21 ENCOUNTER — Ambulatory Visit (INDEPENDENT_AMBULATORY_CARE_PROVIDER_SITE_OTHER)
Admission: RE | Admit: 2015-06-21 | Discharge: 2015-06-21 | Disposition: A | Discharge status: Home / Self Care | Payer: Medicare Other | Source: Ambulatory Visit | Attending: Pulmonary Disease | Admitting: Pulmonary Disease

## 2015-06-21 ENCOUNTER — Ambulatory Visit (INDEPENDENT_AMBULATORY_CARE_PROVIDER_SITE_OTHER): Payer: Medicare Other | Admitting: Pulmonary Disease

## 2015-06-21 ENCOUNTER — Other Ambulatory Visit (INDEPENDENT_AMBULATORY_CARE_PROVIDER_SITE_OTHER): Payer: Medicare Other

## 2015-06-21 VITALS — BP 120/64 | HR 94 | Ht 70.0 in | Wt 160.0 lb

## 2015-06-21 VITALS — HR 109 | Temp 97.7°F

## 2015-06-21 DIAGNOSIS — C3431 Malignant neoplasm of lower lobe, right bronchus or lung: Secondary | ICD-10-CM | POA: Diagnosis not present

## 2015-06-21 DIAGNOSIS — R2681 Unsteadiness on feet: Secondary | ICD-10-CM | POA: Diagnosis not present

## 2015-06-21 DIAGNOSIS — I2584 Coronary atherosclerosis due to calcified coronary lesion: Secondary | ICD-10-CM

## 2015-06-21 DIAGNOSIS — J189 Pneumonia, unspecified organism: Secondary | ICD-10-CM

## 2015-06-21 DIAGNOSIS — J690 Pneumonitis due to inhalation of food and vomit: Secondary | ICD-10-CM

## 2015-06-21 DIAGNOSIS — I2510 Atherosclerotic heart disease of native coronary artery without angina pectoris: Secondary | ICD-10-CM

## 2015-06-21 DIAGNOSIS — M6281 Muscle weakness (generalized): Secondary | ICD-10-CM | POA: Diagnosis not present

## 2015-06-21 DIAGNOSIS — R05 Cough: Secondary | ICD-10-CM | POA: Diagnosis not present

## 2015-06-21 DIAGNOSIS — R1310 Dysphagia, unspecified: Secondary | ICD-10-CM | POA: Diagnosis not present

## 2015-06-21 DIAGNOSIS — R2689 Other abnormalities of gait and mobility: Secondary | ICD-10-CM | POA: Diagnosis not present

## 2015-06-21 DIAGNOSIS — M21371 Foot drop, right foot: Secondary | ICD-10-CM | POA: Diagnosis not present

## 2015-06-21 LAB — CBC WITH DIFFERENTIAL/PLATELET
BASOS ABS: 0 10*3/uL (ref 0.0–0.1)
BASOS PCT: 0.2 % (ref 0.0–3.0)
EOS ABS: 0 10*3/uL (ref 0.0–0.7)
Eosinophils Relative: 0.1 % (ref 0.0–5.0)
HEMATOCRIT: 34.5 % — AB (ref 39.0–52.0)
HEMOGLOBIN: 11.5 g/dL — AB (ref 13.0–17.0)
LYMPHS PCT: 20 % (ref 12.0–46.0)
Lymphs Abs: 1.6 10*3/uL (ref 0.7–4.0)
MCHC: 33.4 g/dL (ref 30.0–36.0)
MCV: 84.8 fl (ref 78.0–100.0)
Monocytes Absolute: 0.5 10*3/uL (ref 0.1–1.0)
Monocytes Relative: 5.9 % (ref 3.0–12.0)
Neutro Abs: 5.9 10*3/uL (ref 1.4–7.7)
Neutrophils Relative %: 73.8 % (ref 43.0–77.0)
Platelets: 367 10*3/uL (ref 150.0–400.0)
RBC: 4.07 Mil/uL — ABNORMAL LOW (ref 4.22–5.81)
RDW: 16.1 % — AB (ref 11.5–15.5)
WBC: 8 10*3/uL (ref 4.0–10.5)

## 2015-06-21 MED ORDER — PREDNISONE 10 MG PO TABS: ORAL_TABLET | ORAL | Status: DC

## 2015-06-21 MED ORDER — FLUTTER DEVI: Status: DC

## 2015-06-21 MED ORDER — LEVOFLOXACIN 750 MG PO TABS: 750.0000 mg | ORAL_TABLET | Freq: Every day | ORAL | Status: DC

## 2015-06-21 NOTE — Patient Instructions (Addendum)
Take the antibiotics as prescribed for one week Take the prednisone 10 mg daily 3 weeks followed by 5 mg daily 3 weeks We will call you with the results of today's chest x-ray and blood work Take Mucinex twice a day, I recommend 1200 mg of extended-release Use the flutter valve 4-5 breaths, 4-5 times per day to help with mucociliary clearance We will see you back in 8 weeks or sooner if needed  If your symptoms have not improved in 3 days please let us know or if you have worsening of symptoms you need to go to the emergency department.

## 2015-06-21 NOTE — Assessment & Plan Note (Signed)
Recommend that she see neurology.

## 2015-06-21 NOTE — Assessment & Plan Note (Signed)
He has severe airflow obstruction, but his most pressing respiratory problem has been recurrent aspiration pneumonia. He does seem to do better with prolonged prednisone so I will continue this for another 4-6 weeks at a low dose but I advised him today that this greatly increases his risk for osteoporosis and with his generalized weakness this could potentially lead to a fracture. However, considering his recurrent respiratory symptoms I think it is reasonable at this time.  We do need to think about other problems I can cause recurrent COPD exacerbations such as allergy or fungal type syndromes.  Plan: Continue inhaled therapies as prescribed Check serum CBC and serum IgE Extent prednisone for another 6 weeks 10 mg daily for 3 weeks followed by 5 mg daily for 3 weeks Follow-up in 8 weeks or sooner if needed

## 2015-06-21 NOTE — Progress Notes (Signed)
Subjective:    Patient ID: Malik Zuniga, male    DOB: 1946-01-30, 70 y.o.   MRN: [id number]  Synopsis: former patient of Dr. [name] with COPD and recurrent aspiration pneumonia.  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  He has recurrent aspiration pneumonia.  [name] 04/2009:  FEV1 1.65 (47%), FEV1% 49  CT chest 10/2013:  RLL infiltrates CXR 10/2013:  LLL infiltrate +h/o aspiration in the past.  +speech therapy evaluation with recs given.   HPI  Chief Complaint  Patient presents with  . Follow-up    PNA, breathing has been moderately good, no chest tightness, coughing up brown mucus.  Discuss staying on prednisone a little longer., discuss if okay to go back on fish oil.   Bazil says that he started feeling more short of breath this morning.  He is not wheezing. He tried to go to rehab but his O2 saturation level and HR were not permissive of this.  He says that he also has a headache.  He is coughing up light brown mucus.  He choked on a pill right before he came in here.  He still has problems with swallowing.  He tries to follow the speech therapy guidelines.   He feels like his muscle strength with physical therapy has improved.    Prior to that he had been making "good progress" with rehab.  He doesn't need oxygen there.  Still taking inhaled medicines regularly.   He feels like he hasn't gotten over the last spell of pneumonia.  Past Medical History  Diagnosis Date  . Hyperlipidemia     takes Simvastatin daily  . Tremor   . History of prostate cancer   . On home O2   . Neuromuscular disorder ([place]) 1998    right carpal tunnel release  . Anemia associated with acute blood loss   . GERD (gastroesophageal reflux disease)     takes Omeprazole daily  . Hypertension     takes Metoprolol daily as well as Hyzaar  . Constipation     takes Colace daily as well as Miralax  . Anxiety     takes Valium daily  . Depression     takes Cymbalta daily   . Emphysema of lung (HCC)     Albuterol as needed;Symbicort daily and Singulair at bedtime  . Emphysema   . Myocardial infarction ([place]) 04/1998  . Coronary artery disease   . Asthma   . Shortness of breath     with exertion  . History of bronchitis   . Aspiration pneumonia ([place]) 2010  . Headache, chronic daily   . History of migraine     last migraine a couple of days ago;takes Excedrin Migraine  . Chronic back pain     compression fracture  . History of colon polyps   . Mood change ([place])     after Brain surgery mood changed and was placed on Depakote  . Insomnia     takes Benadryl nightly  . 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 Prostate cancer ([place]) 2004  . Lung cancer ([place])         Review of Systems  Constitutional: Negative for fever, chills and fatigue.  HENT: Negative for postnasal drip, rhinorrhea and sinus pressure.   Respiratory: Positive for cough and shortness of breath. Negative for wheezing.        Objective:   Physical Exam Filed Vitals:   06/21/15 1410  BP: 120/64  Pulse: 94  Height: '5\' 10"'$  (1.778 m)  Weight: 160 lb (72.576 kg)  SpO2: 92%   RA  Gen: chronically ill appearing, in wheelchair HENT: OP clear, TM's clear, neck supple PULM: Crackles R base, normal percussion and effort CV: RRR, no mgr, trace edema GI: BS+, soft, nontender Derm: no cyanosis or rash Psyche: normal mood and affect   March 2017 CT chest: The adenocarcinoma has decreased in size, left lung consolidation and small pleural effusion slightly improved. Images personally reviewed by me. Primary care records reviewed were he was seen for routine medical issues including his hypertension     Assessment & Plan:  COPD  GOLD III with reversibility  He has severe airflow obstruction, but his most pressing respiratory problem has been recurrent aspiration pneumonia. He does seem to do better with prolonged prednisone so I will continue this for another 4-6 weeks at a low dose  but I advised him today that this greatly increases his risk for osteoporosis and with his generalized weakness this could potentially lead to a fracture. However, considering his recurrent respiratory symptoms I think it is reasonable at this time.  We do need to think about other problems I can cause recurrent COPD exacerbations such as allergy or fungal type syndromes.  Plan: Continue inhaled therapies as prescribed Check serum CBC and serum IgE Extent prednisone for another 6 weeks 10 mg daily for 3 weeks followed by 5 mg daily for 3 weeks Follow-up in 8 weeks or sooner if needed  Recurrent aspiration pneumonia ([place]) I worry that he is having yet another bout of aspiration pneumonia as he is not wheezing on exam today but has crackles in the right lower lobe and he's been noting some choking episodes recently. Next line in general, I'm concerned that his aspiration is due somehow to pharyngeal muscle weakness as documented on multiple swallowing evaluations in the past. This has never been evaluated to my knowledge by neurology.  Plan: Treat aspiration pneumonia with Levaquin 7 days Use Mucinex Use flutter valve to help with mucociliary clearance He was advised today to call us if no improvement in 3 days or go to the emergency room if the symptoms worsen Refer to neurology for dysphagia  Primary cancer of right lower lobe of lung [facility]) Continue follow-up with radiation oncology.  Dysphagia Recommend that she see neurology.     Current outpatient prescriptions:  .  ARIPiprazole (ABILIFY) 10 MG tablet, Take 5 mg by mouth daily. , Disp: , Rfl:  .  Ascorbic Acid (VITAMIN C) 1000 MG tablet, Take 1,000 mg by mouth daily., Disp: , Rfl:  .  aspirin-acetaminophen-caffeine (EXCEDRIN MIGRAINE) 250-250-65 MG per tablet, Take 2 tablets by mouth every 6 (six) hours as needed. For headaches, Disp: , Rfl:  .  budesonide (PULMICORT) 0.25 MG/2ML nebulizer solution, Take 4 mLs (0.5 mg total) by  nebulization 2 (two) times daily., Disp: 60 mL, Rfl: 3 .  cetirizine (ZYRTEC) 10 MG tablet, TAKE 1 TABLET BY MOUTH DAILY., Disp: 30 tablet, Rfl: 10 .  cholecalciferol (VITAMIN D) 1000 UNITS tablet, Take 1,000 Units by mouth every morning. , Disp: , Rfl:  .  clotrimazole-betamethasone (LOTRISONE) cream, Apply 1 application topically 2 (two) times daily., Disp: 45 g, Rfl: 1 .  dextromethorphan-guaiFENesin (MUCINEX DM) 30-600 MG 12hr tablet, Take 1 tablet by mouth 2 (two) times daily., Disp: , Rfl:  .  diazepam (VALIUM) 5 MG tablet, Take 5 mg by mouth 3 (three) times daily. scheduled, Disp: ,  Rfl:  .  divalproex (DEPAKOTE) 500 MG 24 hr tablet, Take 1,000 mg by mouth at bedtime. , Disp: , Rfl:  .  docusate sodium (COLACE) 100 MG capsule, Take 200 mg by mouth at bedtime. , Disp: , Rfl:  .  DULoxetine (CYMBALTA) 60 MG capsule, Take 120 mg by mouth daily. , Disp: , Rfl:  .  fluconazole (DIFLUCAN) 100 MG tablet, Take 1 tablet (100 mg total) by mouth daily., Disp: 7 tablet, Rfl: 0 .  fluticasone (FLONASE) 50 MCG/ACT nasal spray, PLACE 2 SPRAYS INTO BOTH NOSTRILS DAILY. (Patient taking differently: PLACE 2 SPRAYS INTO BOTH NOSTRILS DAILY as needed for allergies), Disp: 16 g, Rfl: 2 .  HYDROcodone-acetaminophen (NORCO) 10-325 MG per tablet, Take 1 tablet by mouth every 6 (six) hours as needed for moderate pain. for pain, Disp: , Rfl: 0 .  ipratropium-albuterol (DUONEB) 0.5-2.5 (3) MG/3ML SOLN, Take 3 mLs by nebulization every 6 (six) hours., Disp: 360 mL, Rfl: 3 .  losartan (COZAAR) 50 MG tablet, Take 1 tablet (50 mg total) by mouth daily., Disp: 30 tablet, Rfl: 6 .  metoprolol succinate (TOPROL-XL) 50 MG 24 hr tablet, TAKE 1 TABLET (50 MG TOTAL) BY MOUTH EVERY MORNING. TAKE WITH OR IMMEDIATELY FOLLOWING A MEAL., Disp: 90 tablet, Rfl: 1 .  montelukast (SINGULAIR) 10 MG tablet, Take 1 tablet (10 mg total) by mouth at bedtime., Disp: 90 tablet, Rfl: 1 .  NITROSTAT 0.4 MG SL tablet, PLACE 1 TABLET (0.4 MG TOTAL)  UNDER THE TONGUE EVERY 5 (FIVE) MINUTES AS NEEDED FOR CHEST PAIN., Disp: 25 tablet, Rfl: 1 .  omeprazole (PRILOSEC) 40 MG capsule, Take 40 mg by mouth 2 (two) times daily. , Disp: , Rfl:  .  polyethylene glycol (MIRALAX / GLYCOLAX) packet, Take 17 g by mouth 3 (three) times daily as needed for moderate constipation. For constipation, Disp: , Rfl:  .  predniSONE (DELTASONE) 20 MG tablet, Take 20 mg by mouth daily with breakfast., Disp: , Rfl:  .  primidone (MYSOLINE) 250 MG tablet, TAKE 1 TABLET (250 MG TOTAL) BY MOUTH 2 (TWO) TIMES DAILY., Disp: 180 tablet, Rfl: 1 .  promethazine (PHENERGAN) 25 MG tablet, Take 25 mg by mouth every 6 (six) hours as needed. Reported on 04/06/2015, Disp: , Rfl: 1 .  simvastatin (ZOCOR) 40 MG tablet, TAKE 1 TABLET (40 MG TOTAL) BY MOUTH EVERY EVENING., Disp: 90 tablet, Rfl: 3 .  tiZANidine (ZANAFLEX) 4 MG tablet, TAKE 1 TABLET BY MOUTH EVERY 8 HOURS AS NEEDED FOR MUSCLE SPASMS., Disp: 90 tablet, Rfl: 1 .  Zinc 50 MG TABS, Take 50 mg by mouth daily. , Disp: , Rfl:  .  Respiratory Therapy Supplies (FLUTTER) DEVI, Use as directed., Disp: 1 each, Rfl: 0

## 2015-06-21 NOTE — Assessment & Plan Note (Signed)
Continue follow-up with radiation oncology.

## 2015-06-21 NOTE — Assessment & Plan Note (Signed)
I worry that he is having yet another bout of aspiration pneumonia as he is not wheezing on exam today but has crackles in the right lower lobe and he's been noting some choking episodes recently. Next line in general, I'm concerned that his aspiration is due somehow to pharyngeal muscle weakness as documented on multiple swallowing evaluations in the past. This has never been evaluated to my knowledge by neurology.  Plan: Treat aspiration pneumonia with Levaquin 7 days Use Mucinex Use flutter valve to help with mucociliary clearance He was advised today to call us if no improvement in 3 days or go to the emergency room if the symptoms worsen Refer to neurology for dysphagia

## 2015-06-21 NOTE — Therapy (Signed)
[facility] [address] [place], [place], [address] Phone: [phone]   Fax:  [phone]  Physical Therapy Treatment  Patient Details  Name: Malik Zuniga MRN: [id number] Date of Birth: Sep 04, 1945 Referring Provider: Simonne Maffucci, MD  Encounter Date: 06/21/2015      PT End of Session - 06/21/15 1156    Visit Number 11   Number of Visits 17   Date for PT Re-Evaluation 07/06/15   Authorization Type Medicare primary; BCBS secondary - G Codes and PN required every 10 visits   PT Start Time 1104   PT Stop Time 1950  Session ended early due to nausea, tachycardia.   PT Time Calculation (min) 31 min   Equipment Utilized During Treatment Gait belt   Activity Tolerance Patient limited by fatigue;Other (comment)  increased HR and decreased SpO2 with standing/ambulation   Behavior During Therapy [facility] for tasks assessed/performed      Past Medical History  Diagnosis Date  . Hyperlipidemia     takes Simvastatin daily  . Tremor   . History of prostate cancer   . On home O2   . Neuromuscular disorder ([place]) 1998    right carpal tunnel release  . Anemia associated with acute blood loss   . GERD (gastroesophageal reflux disease)     takes Omeprazole daily  . Hypertension     takes Metoprolol daily as well as Hyzaar  . Constipation     takes Colace daily as well as Miralax  . Anxiety     takes Valium daily  . Depression     takes Cymbalta daily  . Emphysema of lung (HCC)     Albuterol as needed;Symbicort daily and Singulair at bedtime  . Emphysema   . Myocardial infarction ([place]) 04/1998  . Coronary artery disease   . Asthma   . Shortness of breath     with exertion  . History of bronchitis   . Aspiration pneumonia ([place]) 2010  . Headache, chronic daily   . History of migraine     last migraine a couple of days ago;takes Excedrin Migraine  . Chronic back pain     compression fracture  . History of colon  polyps   . Mood change ([place])     after Brain surgery mood changed and was placed on Depakote  . Insomnia     takes Benadryl nightly  . 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 Prostate cancer ([place]) 2004  . Lung cancer [facility])     Past Surgical History  Procedure Laterality Date  . Cholecystectomy  1996  . 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 Annette Stable  . Vascular stent  2000    Dr. Rollene Fare; he reports cardiac stent, but denies stent for PAD 06/15/13  . Hand surgery  1989    crushed thumb, Dr. Fredna Dow  . Ulnar nerve repair  1998    left arm, Dr. Fredna Dow  . Gastrostomy w/ feeding tube  2008    Dr. Watt Climes; only had for a few months  . Prostatectomy  04/2001    removal of prostate cancer, Dr. Janice Norrie  . Coronary angioplasty with stent placement  04/1998  . Carpal tunnel release  1998    right  . Spine surgery    . 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 Back surgery  08/2004; 02/2005; 04/2006; 06/2007; 7/20101    all by Dr. Annette Stable  . Esophagogastroduodenoscopy  N/A 07/23/2012    Procedure: ESOPHAGOGASTRODUODENOSCOPY (EGD);  Surgeon: Jeryl Columbia, MD;  Location: [facility] ENDOSCOPY;  Service: Endoscopy;  Laterality: N/A;  buccini /ja  . Colonoscopy    . Back surgery  05/2013  . Dg biopsy lung      Filed Vitals:   06/21/15 1106 06/21/15 1121 06/21/15 1125  Pulse: 119 112 109  Temp:   97.7 F (36.5 C)  SpO2: 92% 86% 88%        Subjective Assessment - 06/21/15 1106    Subjective "I wheeled in here all the way from the car, and my air was 91%. So, it's getting better." Mid-session, pt reporting having experienced nausea for the past few days.   Pertinent History * Monitor SpO2 closely *   PMH: PNA, lung CA (diagnosed 11/2014; currently no radiation or chemo), COPD, CVA (1998),  aneurysm clipping (1999), kyphoscoliosis, thoracolumbar fusion x8 levels, HTN, HLD, prostate cancer (2004), MI (2000), depression, anxiety, bipolar disorder   Patient Stated Goals "I'd like to keep working on the stairs, so I  feel more in control. I'd like to keep working on my endurance, and to be able to push my wheelchair further without getting so tired."   Currently in Pain? Yes   Pain Score 4    Pain Location Back   Pain Orientation Lower   Pain Descriptors / Indicators Aching   Pain Type Chronic pain   Pain Onset More than a month ago   Pain Frequency Constant   Aggravating Factors  standing and walking   Pain Relieving Factors pain medication   Effect of Pain on Daily Activities limits standing and walking tolerance   Multiple Pain Sites No                         [facility] Adult PT Treatment/Exercise - 06/21/15 0001    Ambulation/Gait   Ambulation/Gait Yes   Ambulation/Gait Assistance 5: Supervision   Ambulation/Gait Assistance Details gait trial x20' ended due to low SpO2   Ambulation Distance (Feet) 20 Feet  15' then x20'   Assistive device Rolling walker;Other (Comment);R Forearm Crutch;L Forearm Crutch  R AFO    Gait Pattern Decreased step length - left;Decreased stride length;Decreased stance time - right;Decreased hip/knee flexion - right;Decreased hip/knee flexion - left;Decreased weight shift to right;Right flexed knee in stance;Left flexed knee in stance;Trunk flexed;Step-through pattern  improved R step length, foot clearance/placement with AFO   Ambulation Surface Level;Indoor   Neuro Re-ed    Neuro Re-ed Details  Standing at countertop without UE support with min guard, static standing without UE support x20 seconds prior to ending trial due to low SpO2, tachycardia. After seated rest breaks and vitals returned to WNL, pt performed static standing with narrow BOS with finger tip support and min guard. Again, trial ended due to low SpO2.                  PT Education - 06/21/15 1154    Education provided Yes   Education Details Recommended to end session early due to tachycardia, pt-reported nausea. Also advised pt to tell pulmonologist about symptoms during appt  today.   Person(s) Educated Patient   Methods Explanation   Comprehension Verbalized understanding          PT Short Term Goals - 06/02/15 1624    PT SHORT TERM GOAL #1   Title Pt will independently perform HEP to maximize functional gains made in PT.    (  Target date: 06/04/15)   Baseline Met 4/10.   Status Achieved   PT SHORT TERM GOAL #2   Title Orthotist will be present for PT session to assess/address if pt is appropriate candidate for R AFO to improve safety with short-distance household ambulation.   (Target date: 06/04/15)   Baseline Orthotist present for session on 4/10.   Patient scheduled to receive personal R AFO week of 4/24.   Status Achieved   PT SHORT TERM GOAL #3   Title Schedule consult for custom w/c to improve positioning, postural alignment, contracture prevention, preservation of skin integrity, and improved breath support.    (Target date: 06/04/15)   Baseline Custom wheelchair assessment occurred on 4/18. Pt scheduled to receive new ultra light custom wheelchair 6-10 weeks from assessment.     Status Achieved   PT SHORT TERM GOAL #4   Title Pt will perform sit > stand from w/c with min A using LRAD to indicate increased safety with functional transfers.    (Target date: 06/04/15)   Baseline Met 4/3.   Status Achieved   PT SHORT TERM GOAL #5   Title Pt will ambulate x20' with Min A using LRAD with SpO2 > 89% to indicate improved activity tolerance.    (Target date: 06/04/15)   Baseline Met 4/3.   Status Achieved   PT SHORT TERM GOAL #6   Title Pt will negotiate 6 stairs with single R rail to with Min A with SpO2 > 89% to indicate increased pt safety using primary home entrance.  (Target date: 06/04/15)   Baseline Met 4/20.   Status Achieved           PT Long Term Goals - 06/01/15 1124    PT LONG TERM GOAL #1   Title Pt will perform sit > stand from w/c with mod I using LRAD to indicate increased safety with functional transfers.    (Target date: 07/02/15)    Baseline 4/21: REVISED from supervision to Mod I due to pt progress.   Status Revised   PT LONG TERM GOAL #2   Title Pt will ambulate 150' with Mod I using LRAD with SpO2 > 89% to indicate increased safety with household ambulation.      (Target date: 07/02/15)   Baseline 4/21: REVISED from 36' to 150' and from supervision to mod I due to pt progress.   Status Revised   PT LONG TERM GOAL #3   Title Pt will negotiate 6 stairs with single R rail to with mod I with SpO2 > 89% to indicate increased pt safety using primary home entrance.    (Target date: 07/02/15)   Baseline 4/21: REVISED from supervision to mod I due to pt progress.   Status Revised   PT LONG TERM GOAL #4   Title Pt will perform self-propulsion of w/c > 150' over level, indoor surfaces with mod I and SpO2 > 89% to indicate increased independence with w/c mobility.  (Target date: 07/02/15)   Status On-going   PT LONG TERM GOAL #5   Title --   Status --               Plan - 06/21/15 1157    Clinical Impression Statement Session planned to focus on initiating standing balance HEP and continued gait training with emphasis on safe use of RW. Noted SpO2 decreasing quickly (86% at lowest) and tachycardia with minimal standing (<45 seconds) and gait (20'). Upon further questioning mid-session, pt reporting nausea over past  few days. Temp 97.7 degrees. Recommended to end session early due to tachycardia, pt-reported nausea. Also advised pt notify pulmonologist about symptoms during appt today. Pt verbally agreed.   Rehab Potential Fair   Clinical Impairments Affecting Rehab Potential complex medical history   PT Frequency 2x / week   PT Duration 8 weeks   PT Treatment/Interventions ADLs/Self Care Home Management;DME Instruction;Stair training;Gait training;Neuromuscular re-education;Functional mobility training;Therapeutic activities;Therapeutic exercise;Balance training;Orthotic Fit/Training;Patient/family education;Wheelchair  mobility training;Vestibular   PT Next Visit Plan Ask if pt feeling better than last session (ongoing nausea?).  Add standing balance exercises to HEP, if safe.   Consulted and Agree with Plan of Care Patient      Patient will benefit from skilled therapeutic intervention in order to improve the following deficits and impairments:  Abnormal gait, Cardiopulmonary status limiting activity, Decreased activity tolerance, Decreased balance, Decreased endurance, Decreased skin integrity, Decreased strength, Difficulty walking, Impaired sensation, Postural dysfunction, Pain, Dizziness  Visit Diagnosis: Other abnormalities of gait and mobility  Unsteadiness on feet  Muscle weakness (generalized)     Problem List Patient Active Problem List   Diagnosis Date Noted  . Tinea cruris 06/05/2015  . Sepsis ([place]) 04/01/2015  . CAP (community acquired pneumonia) 04/01/2015  . Cancer of lower lobe of right lung ([place]) 04/01/2015  . Chronic respiratory failure with hypoxia ([place]) 04/01/2015  . Cough 03/08/2015  . Dysphagia 02/01/2015  . Primary cancer of right lower lobe of lung ([place]) 12/05/2014  . Lung mass   . Pressure sore on elbow 05/10/2014  . Abscess of buttock, right 04/12/2014  . Recurrent aspiration pneumonia ([place]) 11/16/2013  . Toenail fungus 11/06/2013  . HCAP (healthcare-associated pneumonia) 10/23/2013  . Hypotension 10/16/2013  . 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 CAD (coronary artery disease) 07/27/2013  . Thoracic compression fracture ([place]) 06/21/2013  . Thoracic spine fracture ([place]) 06/21/2013  . Giant comedone 11/04/2012  . Nausea with vomiting 07/24/2012  . Unspecified constipation 07/24/2012  . Abdominal pain, acute, epigastric 07/22/2012  . Hyponatremia 07/22/2012  . Chronic lower back pain 07/22/2012  . Transaminitis 07/22/2012  . Prostate cancer ([place]) 04/22/2012  . Routine general medical examination at a health care facility 04/22/2012  . Pseudomonas infection  11/24/2011  . E coli bacteremia 11/24/2011  . Postoperative infection 11/24/2011  . Anemia associated with acute blood loss   . Bacteremia due to Pseudomonas 10/04/2011  . Sepsis(995.91) 10/02/2011  . 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 Falls frequently 07/31/2011  . COPD  GOLD III with reversibility  01/14/2011  . Allergic rhinitis, seasonal 12/18/2010  . Angioedema 10/31/2010  . Neoplasm of uncertain behavior of skin 05/07/2010  . MYOCARDIAL INFARCTION 02/20/2010  . C V A / STROKE 02/20/2010  . CANDIDIASIS 02/13/2010  . Hyperlipidemia 02/13/2010  . Bipolar disorder ([place]) 02/13/2010  . DEPRESSION 02/13/2010  . Essential hypertension 02/13/2010  . TREMOR 02/13/2010  . PROSTATE CANCER, HX OF 02/13/2010  . Hx of TIA (transient ischemic attack) and stroke 02/13/2010  . Chronic daily headache 02/13/2010    Billie Ruddy, PT, DPT [facility] [address] [place], [place], [address] Phone: [phone]   Fax:  [phone] 06/21/2015, 12:12 PM  Name: Malik Zuniga MRN: [id number] Date of Birth: [birth year]

## 2015-06-22 ENCOUNTER — Telehealth: Payer: Self-pay | Admitting: Pulmonary Disease

## 2015-06-22 LAB — IGE: IgE (Immunoglobulin E), Serum: 13 kU/L (ref <115)

## 2015-06-22 MED ORDER — PREDNISONE 10 MG PO TABS: ORAL_TABLET | ORAL | Status: DC

## 2015-06-22 MED ORDER — LEVOFLOXACIN 750 MG PO TABS: 750.0000 mg | ORAL_TABLET | Freq: Every day | ORAL | Status: DC

## 2015-06-22 NOTE — Telephone Encounter (Signed)
Spoke to the pt's wife. The pt's antibiotic and prednisone were sent to the pt's mail order pharmacy. These need to be sent to [facility] at [facility]. These have been sent in. Nothing further was needed.

## 2015-06-25 ENCOUNTER — Encounter (HOSPITAL_COMMUNITY): Payer: Self-pay | Admitting: *Deleted

## 2015-06-25 ENCOUNTER — Telehealth: Payer: Self-pay | Admitting: Pulmonary Disease

## 2015-06-25 ENCOUNTER — Inpatient Hospital Stay (HOSPITAL_COMMUNITY): Payer: Medicare Other

## 2015-06-25 ENCOUNTER — Encounter: Payer: Self-pay | Admitting: Adult Health

## 2015-06-25 ENCOUNTER — Inpatient Hospital Stay (HOSPITAL_COMMUNITY)
Admission: AD | Admit: 2015-06-25 | Discharge: 2015-06-29 | DRG: 178 | Disposition: A | Discharge status: Home / Self Care | Payer: Medicare Other | Source: Ambulatory Visit | Attending: Pulmonary Disease | Admitting: Pulmonary Disease

## 2015-06-25 ENCOUNTER — Ambulatory Visit: Payer: Medicare Other | Admitting: Physical Therapy

## 2015-06-25 ENCOUNTER — Ambulatory Visit (INDEPENDENT_AMBULATORY_CARE_PROVIDER_SITE_OTHER): Payer: Medicare Other | Admitting: Adult Health

## 2015-06-25 VITALS — BP 140/94 | HR 96 | Temp 98.2°F | Ht 70.0 in | Wt 160.0 lb

## 2015-06-25 DIAGNOSIS — Z79891 Long term (current) use of opiate analgesic: Secondary | ICD-10-CM

## 2015-06-25 DIAGNOSIS — Z79899 Other long term (current) drug therapy: Secondary | ICD-10-CM | POA: Diagnosis not present

## 2015-06-25 DIAGNOSIS — J441 Chronic obstructive pulmonary disease with (acute) exacerbation: Secondary | ICD-10-CM | POA: Diagnosis present

## 2015-06-25 DIAGNOSIS — Z7951 Long term (current) use of inhaled steroids: Secondary | ICD-10-CM

## 2015-06-25 DIAGNOSIS — E785 Hyperlipidemia, unspecified: Secondary | ICD-10-CM | POA: Diagnosis present

## 2015-06-25 DIAGNOSIS — J432 Centrilobular emphysema: Secondary | ICD-10-CM

## 2015-06-25 DIAGNOSIS — M40209 Unspecified kyphosis, site unspecified: Secondary | ICD-10-CM | POA: Diagnosis present

## 2015-06-25 DIAGNOSIS — Z792 Long term (current) use of antibiotics: Secondary | ICD-10-CM

## 2015-06-25 DIAGNOSIS — Z8249 Family history of ischemic heart disease and other diseases of the circulatory system: Secondary | ICD-10-CM

## 2015-06-25 DIAGNOSIS — M549 Dorsalgia, unspecified: Secondary | ICD-10-CM | POA: Diagnosis present

## 2015-06-25 DIAGNOSIS — I10 Essential (primary) hypertension: Secondary | ICD-10-CM | POA: Diagnosis present

## 2015-06-25 DIAGNOSIS — Z888 Allergy status to other drugs, medicaments and biological substances status: Secondary | ICD-10-CM | POA: Diagnosis not present

## 2015-06-25 DIAGNOSIS — R1310 Dysphagia, unspecified: Secondary | ICD-10-CM | POA: Diagnosis not present

## 2015-06-25 DIAGNOSIS — Z809 Family history of malignant neoplasm, unspecified: Secondary | ICD-10-CM | POA: Diagnosis not present

## 2015-06-25 DIAGNOSIS — C3490 Malignant neoplasm of unspecified part of unspecified bronchus or lung: Secondary | ICD-10-CM | POA: Diagnosis present

## 2015-06-25 DIAGNOSIS — F319 Bipolar disorder, unspecified: Secondary | ICD-10-CM | POA: Diagnosis present

## 2015-06-25 DIAGNOSIS — J690 Pneumonitis due to inhalation of food and vomit: Secondary | ICD-10-CM | POA: Diagnosis not present

## 2015-06-25 DIAGNOSIS — G8929 Other chronic pain: Secondary | ICD-10-CM | POA: Diagnosis present

## 2015-06-25 DIAGNOSIS — Z87891 Personal history of nicotine dependence: Secondary | ICD-10-CM | POA: Diagnosis not present

## 2015-06-25 DIAGNOSIS — J189 Pneumonia, unspecified organism: Secondary | ICD-10-CM | POA: Diagnosis not present

## 2015-06-25 DIAGNOSIS — Z7952 Long term (current) use of systemic steroids: Secondary | ICD-10-CM | POA: Diagnosis not present

## 2015-06-25 DIAGNOSIS — F1120 Opioid dependence, uncomplicated: Secondary | ICD-10-CM | POA: Diagnosis present

## 2015-06-25 DIAGNOSIS — R0989 Other specified symptoms and signs involving the circulatory and respiratory systems: Secondary | ICD-10-CM | POA: Diagnosis not present

## 2015-06-25 DIAGNOSIS — E871 Hypo-osmolality and hyponatremia: Secondary | ICD-10-CM | POA: Diagnosis present

## 2015-06-25 DIAGNOSIS — J449 Chronic obstructive pulmonary disease, unspecified: Secondary | ICD-10-CM

## 2015-06-25 DIAGNOSIS — R05 Cough: Secondary | ICD-10-CM | POA: Diagnosis not present

## 2015-06-25 LAB — CBC WITH DIFFERENTIAL/PLATELET
Basophils Absolute: 0 10*3/uL (ref 0.0–0.1)
Basophils Relative: 0 %
EOS PCT: 0 %
Eosinophils Absolute: 0 10*3/uL (ref 0.0–0.7)
HCT: 34.1 % — ABNORMAL LOW (ref 39.0–52.0)
Hemoglobin: 11.7 g/dL — ABNORMAL LOW (ref 13.0–17.0)
LYMPHS ABS: 1.9 10*3/uL (ref 0.7–4.0)
LYMPHS PCT: 32 %
MCH: 28.7 pg (ref 26.0–34.0)
MCHC: 34.3 g/dL (ref 30.0–36.0)
MCV: 83.6 fL (ref 78.0–100.0)
MONO ABS: 0.6 10*3/uL (ref 0.1–1.0)
MONOS PCT: 10 %
Neutro Abs: 3.5 10*3/uL (ref 1.7–7.7)
Neutrophils Relative %: 58 %
PLATELETS: 251 10*3/uL (ref 150–400)
RBC: 4.08 MIL/uL — ABNORMAL LOW (ref 4.22–5.81)
RDW: 15.2 % (ref 11.5–15.5)
WBC: 6 10*3/uL (ref 4.0–10.5)

## 2015-06-25 LAB — COMPREHENSIVE METABOLIC PANEL
ALBUMIN: 3.7 g/dL (ref 3.5–5.0)
ALT: 11 U/L — AB (ref 17–63)
AST: 16 U/L (ref 15–41)
Alkaline Phosphatase: 32 U/L — ABNORMAL LOW (ref 38–126)
Anion gap: 8 (ref 5–15)
BUN: 17 mg/dL (ref 6–20)
CHLORIDE: 93 mmol/L — AB (ref 101–111)
CO2: 28 mmol/L (ref 22–32)
Calcium: 9.1 mg/dL (ref 8.9–10.3)
Creatinine, Ser: 0.72 mg/dL (ref 0.61–1.24)
GFR calc Af Amer: >60 mL/min (ref >60)
GFR calc non Af Amer: >60 mL/min (ref >60)
GLUCOSE: 105 mg/dL — AB (ref 65–99)
POTASSIUM: 3.9 mmol/L (ref 3.5–5.1)
Sodium: 129 mmol/L — ABNORMAL LOW (ref 135–145)
Total Bilirubin: 0.6 mg/dL (ref 0.3–1.2)
Total Protein: 6.6 g/dL (ref 6.5–8.1)

## 2015-06-25 LAB — BRAIN NATRIURETIC PEPTIDE: B Natriuretic Peptide: 36.3 pg/mL (ref 0.0–100.0)

## 2015-06-25 MED ORDER — PRIMIDONE 250 MG PO TABS
250.0000 mg | ORAL_TABLET | Freq: Two times a day (BID) | ORAL | Status: DC
  Administered 2015-06-25 – 2015-06-29 (×8): 250 mg via ORAL
  Filled 2015-06-25 (×11): qty 1

## 2015-06-25 MED ORDER — VANCOMYCIN HCL 10 G IV SOLR
1500.0000 mg | INTRAVENOUS | Status: AC
  Administered 2015-06-25: 1500 mg via INTRAVENOUS
  Filled 2015-06-25: qty 1500

## 2015-06-25 MED ORDER — SENNOSIDES-DOCUSATE SODIUM 8.6-50 MG PO TABS
1.0000 | ORAL_TABLET | Freq: Every evening | ORAL | Status: DC | PRN
  Administered 2015-06-26: 1 via ORAL
  Filled 2015-06-25: qty 1

## 2015-06-25 MED ORDER — METOPROLOL SUCCINATE ER 50 MG PO TB24
50.0000 mg | ORAL_TABLET | Freq: Every day | ORAL | Status: DC
  Administered 2015-06-25 – 2015-06-29 (×5): 50 mg via ORAL
  Filled 2015-06-25 (×5): qty 1

## 2015-06-25 MED ORDER — PANTOPRAZOLE SODIUM 40 MG PO TBEC
40.0000 mg | DELAYED_RELEASE_TABLET | Freq: Every day | ORAL | Status: DC
  Administered 2015-06-26 – 2015-06-28 (×3): 40 mg via ORAL
  Filled 2015-06-25 (×3): qty 1

## 2015-06-25 MED ORDER — LORATADINE 10 MG PO TABS
10.0000 mg | ORAL_TABLET | Freq: Every day | ORAL | Status: DC
  Administered 2015-06-26 – 2015-06-29 (×4): 10 mg via ORAL
  Filled 2015-06-25 (×4): qty 1

## 2015-06-25 MED ORDER — DEXTROSE 5 % IV SOLN
2.0000 g | INTRAVENOUS | Status: AC
  Administered 2015-06-25: 2 g via INTRAVENOUS
  Filled 2015-06-25: qty 2

## 2015-06-25 MED ORDER — METHYLPREDNISOLONE SODIUM SUCC 125 MG IJ SOLR
80.0000 mg | Freq: Two times a day (BID) | INTRAMUSCULAR | Status: DC
  Administered 2015-06-25 – 2015-06-26 (×2): 80 mg via INTRAVENOUS
  Filled 2015-06-25 (×2): qty 2

## 2015-06-25 MED ORDER — ONDANSETRON HCL 4 MG PO TABS
4.0000 mg | ORAL_TABLET | Freq: Four times a day (QID) | ORAL | Status: DC | PRN
  Administered 2015-06-26: 4 mg via ORAL
  Filled 2015-06-25: qty 1

## 2015-06-25 MED ORDER — ARIPIPRAZOLE 5 MG PO TABS
5.0000 mg | ORAL_TABLET | Freq: Every day | ORAL | Status: DC
  Administered 2015-06-26 – 2015-06-29 (×4): 5 mg via ORAL
  Filled 2015-06-25 (×4): qty 1

## 2015-06-25 MED ORDER — DM-GUAIFENESIN ER 30-600 MG PO TB12
1.0000 | ORAL_TABLET | Freq: Two times a day (BID) | ORAL | Status: DC
  Administered 2015-06-25 – 2015-06-29 (×8): 1 via ORAL
  Filled 2015-06-25 (×8): qty 1

## 2015-06-25 MED ORDER — DIAZEPAM 5 MG PO TABS
5.0000 mg | ORAL_TABLET | Freq: Three times a day (TID) | ORAL | Status: DC | PRN
  Administered 2015-06-26 – 2015-06-29 (×5): 5 mg via ORAL
  Filled 2015-06-25 (×5): qty 1

## 2015-06-25 MED ORDER — LOSARTAN POTASSIUM 50 MG PO TABS
50.0000 mg | ORAL_TABLET | Freq: Every day | ORAL | Status: DC
  Administered 2015-06-25 – 2015-06-29 (×5): 50 mg via ORAL
  Filled 2015-06-25 (×5): qty 1

## 2015-06-25 MED ORDER — FLUTICASONE PROPIONATE 50 MCG/ACT NA SUSP
2.0000 | Freq: Every day | NASAL | Status: DC
  Administered 2015-06-25 – 2015-06-28 (×4): 2 via NASAL
  Filled 2015-06-25: qty 16

## 2015-06-25 MED ORDER — SODIUM CHLORIDE 0.9 % IV SOLN: 250.0000 mL | INTRAVENOUS | Status: DC | PRN

## 2015-06-25 MED ORDER — HYDROCODONE-ACETAMINOPHEN 10-325 MG PO TABS
1.0000 | ORAL_TABLET | Freq: Four times a day (QID) | ORAL | Status: DC | PRN
  Administered 2015-06-26 – 2015-06-29 (×11): 1 via ORAL
  Filled 2015-06-25 (×11): qty 1

## 2015-06-25 MED ORDER — SODIUM CHLORIDE 0.9% FLUSH
3.0000 mL | Freq: Two times a day (BID) | INTRAVENOUS | Status: DC
  Administered 2015-06-27: 3 mL via INTRAVENOUS

## 2015-06-25 MED ORDER — DIVALPROEX SODIUM ER 500 MG PO TB24
1000.0000 mg | ORAL_TABLET | Freq: Every day | ORAL | Status: DC
  Administered 2015-06-25 – 2015-06-28 (×4): 1000 mg via ORAL
  Filled 2015-06-25 (×4): qty 2

## 2015-06-25 MED ORDER — BUDESONIDE 0.25 MG/2ML IN SUSP
0.5000 mg | Freq: Two times a day (BID) | RESPIRATORY_TRACT | Status: DC
  Administered 2015-06-25 – 2015-06-29 (×8): 0.5 mg via RESPIRATORY_TRACT
  Filled 2015-06-25 (×8): qty 4

## 2015-06-25 MED ORDER — VANCOMYCIN HCL IN DEXTROSE 1-5 GM/200ML-% IV SOLN
1000.0000 mg | Freq: Two times a day (BID) | INTRAVENOUS | Status: DC
  Administered 2015-06-26 – 2015-06-27 (×3): 1000 mg via INTRAVENOUS
  Filled 2015-06-25 (×3): qty 200

## 2015-06-25 MED ORDER — IPRATROPIUM BROMIDE 0.02 % IN SOLN
0.5000 mg | Freq: Four times a day (QID) | RESPIRATORY_TRACT | Status: DC
  Administered 2015-06-25 – 2015-06-27 (×8): 0.5 mg via RESPIRATORY_TRACT
  Filled 2015-06-25 (×10): qty 2.5

## 2015-06-25 MED ORDER — ALBUTEROL SULFATE (2.5 MG/3ML) 0.083% IN NEBU
2.5000 mg | INHALATION_SOLUTION | Freq: Four times a day (QID) | RESPIRATORY_TRACT | Status: DC
  Administered 2015-06-25 – 2015-06-27 (×8): 2.5 mg via RESPIRATORY_TRACT
  Filled 2015-06-25 (×9): qty 3

## 2015-06-25 MED ORDER — SIMVASTATIN 40 MG PO TABS
40.0000 mg | ORAL_TABLET | Freq: Every day | ORAL | Status: DC
  Administered 2015-06-25 – 2015-06-28 (×4): 40 mg via ORAL
  Filled 2015-06-25 (×4): qty 1

## 2015-06-25 MED ORDER — DEXTROSE 5 % IV SOLN
1.0000 g | Freq: Three times a day (TID) | INTRAVENOUS | Status: AC
  Administered 2015-06-26 – 2015-06-28 (×8): 1 g via INTRAVENOUS
  Filled 2015-06-25 (×8): qty 1

## 2015-06-25 MED ORDER — ONDANSETRON HCL 4 MG/2ML IJ SOLN: 4.0000 mg | Freq: Four times a day (QID) | INTRAMUSCULAR | Status: DC | PRN

## 2015-06-25 MED ORDER — POLYETHYLENE GLYCOL 3350 17 G PO PACK
17.0000 g | PACK | Freq: Three times a day (TID) | ORAL | Status: DC | PRN
  Administered 2015-06-26 – 2015-06-28 (×5): 17 g via ORAL
  Filled 2015-06-25 (×5): qty 1

## 2015-06-25 MED ORDER — DULOXETINE HCL 60 MG PO CPEP
120.0000 mg | ORAL_CAPSULE | Freq: Every day | ORAL | Status: DC
  Administered 2015-06-26 – 2015-06-29 (×4): 120 mg via ORAL
  Filled 2015-06-25 (×4): qty 2

## 2015-06-25 MED ORDER — DOCUSATE SODIUM 100 MG PO CAPS
200.0000 mg | ORAL_CAPSULE | Freq: Every day | ORAL | Status: DC
  Administered 2015-06-25 – 2015-06-28 (×4): 200 mg via ORAL
  Filled 2015-06-25 (×4): qty 2

## 2015-06-25 MED ORDER — MONTELUKAST SODIUM 10 MG PO TABS
10.0000 mg | ORAL_TABLET | Freq: Every day | ORAL | Status: DC
Start: 2015-06-25 — End: 2015-06-29
  Administered 2015-06-25 – 2015-06-28 (×4): 10 mg via ORAL
  Filled 2015-06-25 (×4): qty 1

## 2015-06-25 MED ORDER — ENOXAPARIN SODIUM 40 MG/0.4ML ~~LOC~~ SOLN
40.0000 mg | SUBCUTANEOUS | Status: DC
  Administered 2015-06-25 – 2015-06-27 (×3): 40 mg via SUBCUTANEOUS
  Filled 2015-06-25 (×3): qty 0.4

## 2015-06-25 MED ORDER — SODIUM CHLORIDE 0.9% FLUSH: 3.0000 mL | INTRAVENOUS | Status: DC | PRN

## 2015-06-25 MED ORDER — ALBUTEROL SULFATE (2.5 MG/3ML) 0.083% IN NEBU: 2.5000 mg | INHALATION_SOLUTION | RESPIRATORY_TRACT | Status: DC | PRN

## 2015-06-25 NOTE — Progress Notes (Signed)
Pharmacy Antibiotic Note  Malik Zuniga is a 70 y.o. male with hx COPD GOLD -III, lung cancer, and recurrent aspiration PNA secondary to underlying dysphagia and immunospression admitted on 06/25/2015 with concern for pneumonia.  Pharmacy has been consulted for Vancomycin and Cefepime dosing.  First doses delayed due to lack of IV access.    Plan: Cefepime 2g IV x1, then 1g IV q8h Vancomycin '1500mg'$  IV x1, then 1g IV q12h  Temp (24hrs), Avg:98.2 F (36.8 C), Min:98.2 F (36.8 C), Max:98.2 F (36.8 C)   Recent Labs Lab 06/21/15 1451  WBC 8.0    Estimated Creatinine Clearance: 88.2 mL/min (by C-G formula based on Cr of 0.72).    Allergies  Allergen Reactions  . Lisinopril Swelling    ANGIOEDEMA  . Lamictal [Lamotrigine] Other (See Comments)    Weakness/difficulty swallowing  . Ambien [Zolpidem] Other (See Comments)    Unknown reaction    Antimicrobials this admission: 5/15 Cefepime >>  5/15 Vancomycin >>   Dose adjustments this admission:   Microbiology results: 5/15 Sputum: ordered  5/15 U/A: ordered  Thank you for allowing pharmacy to be a part of this patient's care.  Ralene Bathe, PharmD, BCPS 06/25/2015, 6:50 PM  Pager: [phone]

## 2015-06-25 NOTE — H&P (Signed)
Name: Malik Zuniga MRN: [id number] DOB: 28-Sep-1945    ADMISSION DATE:  06/25/15   CHIEF COMPLAINT:  SOB   BRIEF PATIENT DESCRIPTION:  68 yowm former smoker with COPD GOLD III and recurrent aspiration pneumonia. Chronic pain on narcotics. 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 Consequently PET/CT was done on 11/16/14. This showed that the RLL nodule measured 1.3 cm with an SUV max equal to 3.5. CT biopsy was performed on 12/01/14 revealing adenocarcinoma.  PFT on 11/13/2014 showed a post bronchodilator FEV1 at 45%, ratio 45, FVC 74%, positive bronchodilator response.   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  Pt presents for an acute office visit. He was seen 06/21/15 for worsening dyspnea with hypoxia.  Tx for possible recurrent aspiration with levaquin '750mg'$   for 7 days .  Extended prednisone for few weeks at '10mg'$  .  He returns today saying that he is getting worse. More dyspnea, cough, congestion with thick brown mucus. Does have nausea with decreased appetite . No vomiting or diarrhea. Does get choked frequently .  Remains on Pulmicort Twice daily And Duoneb Four times a day.  CXR last visit with chronic BB atx.  Remains on Oxygen 2.5l/m At bedtime  Uses walker and wheelchair. Has chronic back pain followed by Dr. Trenton Gammon, NS.  On chronic narcotics with valium Three times a day , norco 5 daily and zanaflex every other day.  On primidone for tremors.  Swallow evaluation  in 2015 showed mild pharyngeal dysphagia.   PAST MEDICAL HISTORY :   has a past medical history of Hyperlipidemia; Tremor; History of prostate cancer; On home O2; Neuromuscular disorder ([place]) (1998); Anemia associated with acute blood loss; GERD (gastroesophageal reflux disease); Hypertension; Constipation; Anxiety; Depression; Emphysema of lung ([place]); Emphysema; Myocardial infarction ([place]) (04/1998); Coronary artery disease; Asthma; Shortness of breath; History of bronchitis; Aspiration pneumonia ([place]) (2010); Headache, chronic daily; History of migraine; Chronic back pain; History of colon polyps; Mood change ([place]); Insomnia; Stroke ([place]) (1998); Prostate cancer ([place]) (2004); and Lung cancer ([place]).  has past surgical history that includes Cholecystectomy (1996); Craniotomy (1999); vascular stent (2000); Hand surgery (1989); Ulnar nerve repair (1998); Gastrostomy w/ feeding tube (2008); Prostatectomy (04/2001); Coronary angioplasty with stent (04/1998); Carpal tunnel release (1998); Spine surgery; Brain surgery (1999); Back surgery (08/2004; 02/2005; 04/2006; 06/2007; 7/20101); Esophagogastroduodenoscopy (N/A, 07/23/2012); Colonoscopy; Back surgery (05/2013); and DG BIOPSY LUNG. Prior to Admission medications   Medication Sig Start Date End Date Taking? Authorizing Provider  ARIPiprazole (ABILIFY) 10 MG tablet Take 5 mg by mouth daily.  05/09/14   Historical Provider, MD  Ascorbic Acid (VITAMIN C) 1000 MG tablet Take 1,000 mg by mouth daily.    Historical Provider, MD  aspirin-acetaminophen-caffeine (EXCEDRIN MIGRAINE) [phone] MG per tablet Take 2 tablets by mouth every 6 (six) hours as needed. For headaches    Historical Provider, MD  budesonide (PULMICORT) 0.25 MG/2ML nebulizer solution Take 4 mLs (0.5 mg total) by nebulization 2 (two) times daily. 12/07/13   Kathee Delton, MD  cetirizine (ZYRTEC) 10 MG tablet TAKE 1 TABLET BY MOUTH DAILY. 05/03/15   [name], MD  cholecalciferol (VITAMIN D) 1000  UNITS tablet Take 1,000 Units by mouth every morning.     Historical Provider, MD  clotrimazole-betamethasone (LOTRISONE) cream Apply 1 application topically 2 (two) times daily. 06/05/15   [name], MD  dextromethorphan-guaiFENesin [facility] DM) 30-600 MG 12hr tablet Take 1 tablet by mouth 2 (two) times daily.    Historical Provider, MD  diazepam (VALIUM) 5 MG tablet Take 5 mg by mouth 3 (three) times daily. scheduled 06/22/13   Earnie Larsson, MD  divalproex (DEPAKOTE) 500 MG 24 hr tablet Take 1,000 mg by mouth at bedtime.     Historical Provider, MD  docusate sodium (COLACE) 100 MG capsule Take 200 mg by mouth at bedtime.     Historical Provider, MD  DULoxetine (CYMBALTA) 60 MG capsule Take 120 mg by mouth daily.     Historical Provider, MD  fluconazole (DIFLUCAN) 100 MG tablet Take 1 tablet (100 mg total) by mouth daily. 06/05/15   [name], MD  fluticasone (FLONASE) 50 MCG/ACT nasal spray PLACE 2 SPRAYS INTO BOTH NOSTRILS DAILY. Patient taking differently: PLACE 2 SPRAYS INTO BOTH NOSTRILS DAILY as needed for allergies 10/30/14   [name], MD  HYDROcodone-acetaminophen [facility]) 10-325 MG per tablet Take 1 tablet by mouth every 6 (six) hours as needed for moderate pain. for pain 04/07/14   Historical Provider, MD  ipratropium-albuterol (DUONEB) 0.5-2.5 (3) MG/3ML SOLN Take 3 mLs by nebulization every 6 (six) hours. 12/07/13   Kathee Delton, MD  levofloxacin (LEVAQUIN) 750 MG tablet Take 1 tablet (750 mg total) by mouth daily. 06/22/15   [name], MD  losartan (COZAAR) 50 MG tablet Take 1 tablet (50 mg total) by mouth daily. 06/06/15   [name], MD  metoprolol succinate (TOPROL-XL) 50 MG 24 hr tablet TAKE 1 TABLET (50 MG TOTAL) BY MOUTH EVERY MORNING. TAKE WITH OR IMMEDIATELY FOLLOWING A MEAL. 06/14/15   [name], MD  montelukast (SINGULAIR) 10 MG tablet Take 1 tablet (10 mg total) by mouth at bedtime. 06/14/15   [name], MD  NITROSTAT 0.4 MG  SL tablet PLACE 1 TABLET (0.4 MG TOTAL) UNDER THE TONGUE EVERY 5 (FIVE) MINUTES AS NEEDED FOR CHEST PAIN. 08/10/14   Belva Crome, MD  omeprazole (PRILOSEC) 40 MG capsule Take 40 mg by mouth 2 (two) times daily.     Historical Provider, MD  polyethylene glycol (MIRALAX / GLYCOLAX) packet Take 17 g by mouth 3 (three) times daily as needed for moderate constipation. For constipation 07/24/12   Delfina [place], MD  predniSONE (DELTASONE) 10 MG tablet Take prednisone '10mg'$  daily for 3 weeks, then 5 mg daily for 3 weeks. 06/22/15   [name], MD  predniSONE (DELTASONE) 20 MG tablet Take 20 mg by mouth daily with breakfast.    Historical Provider, MD  primidone (MYSOLINE) 250 MG tablet TAKE 1 TABLET (250 MG TOTAL) BY MOUTH 2 (TWO) TIMES DAILY. 06/14/15   [name], MD  promethazine (PHENERGAN) 25 MG tablet Take 25 mg by mouth every 6 (six) hours as needed. Reported on 04/06/2015 08/10/14   Historical Provider, MD  Respiratory Therapy Supplies (FLUTTER) DEVI Use as directed. 06/21/15   [name], MD  simvastatin (ZOCOR) 40 MG tablet TAKE 1 TABLET (40 MG TOTAL) BY MOUTH EVERY EVENING. 06/15/15   Belva Crome, MD  tiZANidine (ZANAFLEX) 4  MG tablet TAKE 1 TABLET BY MOUTH EVERY 8 HOURS AS NEEDED FOR MUSCLE SPASMS. 01/09/15   [name], MD  Zinc 50 MG TABS Take 50 mg by mouth daily.     Historical Provider, MD   Allergies  Allergen Reactions  . Lisinopril Swelling    ANGIOEDEMA  . Lamictal [Lamotrigine] Other (See Comments)    Weakness/difficulty swallowing  . Ambien [Zolpidem] Other (See Comments)    Unknown reaction    FAMILY HISTORY:  family history includes Cancer in his father; Heart disease in his mother. SOCIAL HISTORY:  reports that he quit smoking about 2 years ago. His smoking use included Cigarettes. He has a 78 pack-year smoking history. He has never used smokeless tobacco. He reports that he uses illicit drugs ([place]). He reports that he does not drink  alcohol.  REVIEW OF SYSTEMS:   Constitutional:   No  weight loss, night sweats,  Fevers, chills,  +fatigue, or  lassitude.  HEENT:   No headaches,  Difficulty swallowing,  Tooth/dental problems, or  Sore throat,                No sneezing, itching, ear ache,  +nasal congestion, post nasal drip,   CV:  No chest pain,  Orthopnea, PND, swelling in lower extremities, anasarca, dizziness, palpitations, syncope.   GI  No heartburn, indigestion, abdominal pain, nausea, vomiting, diarrhea, change in bowel habits, loss of appetite, bloody stools.   Resp:    No chest wall deformity  Skin: no rash or lesions.  GU: no dysuria, change in color of urine, no urgency or frequency.  No flank pain, no hematuria   MS:  No joint pain or swelling.  No decreased range of motion.  No back pain.  Psych:  No change in mood or affect. No depression or anxiety.  No memory loss.      SUBJECTIVE:  "Im getting worse "   VITAL SIGNS: Temp:  [98.2 F (36.8 C)] 98.2 F (36.8 C) (05/15 1435) Pulse Rate:  [96] 96 (05/15 1435) BP: (140)/(94) 140/94 mmHg (05/15 1435) SpO2:  [93 %] 93 % (05/15 1435) Weight:  [160 lb (72.576 kg)] 160 lb (72.576 kg) (05/15 1435)  PHYSICAL EXAMINATION: General:  NAD, weak in wc   HEENT: [place]/AT, EACs-clear, TMs-wnl, NOSE-clear, THROAT-clear, no lesions, no postnasal drip or exudate noted. Very poor dentition.   NECK: Supple w/ fair ROM; no JVD; normal carotid impulses w/o bruits; no thyromegaly or nodules palpated; no lymphadenopathy.   RESP Decreased BS in bases , few scattered rhonchi . no accessory muscle use, no dullness to percussion  CARD: RRR, no m/r/g , no peripheral edema, pulses intact, no cyanosis or clubbing.  GI: Soft & nt; nml bowel sounds; no organomegaly or masses detected.   Musco: Warm bil, no deformities or joint swelling noted. Right leg brace.   Neuro: alert, no focal deficits noted.   Skin: Warm, no lesions or rashes No results for  input(s): NA, K, CL, CO2, BUN, CREATININE, GLUCOSE in the last 168 hours.  Recent Labs Lab 06/21/15 1451  HGB 11.5*  HCT 34.5*  WBC 8.0  PLT 367.0   No results found.   ASSESSMENT / PLAN:  1. Recurrent Aspiration PNA -pt with underlying dysphagia along with chronic narcotic use contributing to recurrent aspiration + immunosuppression with steroids   Plan  Admit to [facility].  Begin IV abx  Check sputum cx   2. COPD -Mild flare   Plan  Cont  pulmicort and Duoneb  Change prednisone to solumedrol '80mg'$  .q12, taper to oral steroids as able.   3. Chronic back pain  Plan  Limit narcotics as able.  Change valium to as needed.  Hold zanaflex.     4. Bipolar Disorder   Plan  Cont home meds.    5. HTN  Plan  Cont home rx.    6 . Dysphagia -mild dysphagia on MBS 11/2013 , recommendations for aspiration precautions.   Plan  Aspiration precautions  Try D1 diet  Limit narcotics As needed     Rexene Edison NP - C Pulmonary and Critical Care Medicine [facility] Pager: [phone]  06/25/2015, 3:31 PM

## 2015-06-25 NOTE — Patient Instructions (Signed)
Admit to [facility]

## 2015-06-25 NOTE — Telephone Encounter (Signed)
In order to make a triage decision about how sick he is he will either need to be seen in the ER or in our office and then directly admitted.  We can't really directly admit him without seeing him first and acquiring some basic information like vital signs and labs.  So see if he is willing to come to the office for an acute visit.  I can't really fit him in as I'm already overbooked, but maybe one of the NP's has a slot or someone else.

## 2015-06-25 NOTE — Progress Notes (Signed)
Subjective:    Patient ID: Malik Zuniga, male    DOB: 06-07-1945, 70 y.o.   MRN: [id number]  HPI    Review of Systems     Objective:   Physical Exam        Assessment & Plan:     Subjective:    Patient ID: Malik Zuniga, male    DOB: 1945-08-10 .   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 Consequently PET/CT was done on 11/16/14. This showed that the RLL nodule measured 1.3 cm with an SUV max equal to 3.5. CT biopsy was performed on 12/01/14 revealing adenocarcinoma.  PFT on 11/13/2014 showed a post bronchodilator FEV1 at 45%, ratio 45, FVC 74%, positive bronchodilator response.    History of Present Illness  06/25/2015 Acute OV  Pt presents for an acute office visit. He was seen 06/21/15 for worsening dyspnea with hypoxia .  He was started on levaquin '500mg'$  daily. Extended prednisone for few weeks at '10mg'$  . For possible recurrent aspiration .  He denies hemoptysis , chest pain, orthopnea or edema.  He returns today saying that he is getting worse. More dyspnea, cough, congestion with thick brown mucus. Does have nausea with decreased appetite . No vomiting or diarrhea. Does get choked frequently .  Remains on Pulmicort Twice daily  And Duoneb Four times a day    Remains on Oxygen 2.5l/m At bedtime   Uses walker and wheelchair. Has chronic back pain followed by Dr. Trenton Gammon, NS.  On chronic narcotics with valium Three times a day  , norco 5 daily and zanaflex every other day.   On primidone for tremors.   Swallow evaluation in 2015 showed ild  pharyngeal dysphagia.     Past Medical History  Diagnosis Date  . Hyperlipidemia     takes Simvastatin daily  . Tremor   . History of prostate cancer   . On home O2   . Neuromuscular disorder ([place]) 1998    right carpal tunnel release  . Anemia associated with acute blood loss   . GERD (gastroesophageal reflux disease)     takes Omeprazole daily  . Hypertension     takes Metoprolol daily as well as Hyzaar  . Constipation     takes Colace daily as well as Miralax  . Anxiety     takes Valium daily  . Depression     takes Cymbalta daily  . Emphysema of lung ([facility])     Albuterol as needed;Symbicort daily and Singulair at bedtime  . Emphysema   . Myocardial infarction ([place]) 04/1998  . Coronary artery disease   . Asthma   . Shortness of breath     with exertion  . History of bronchitis   . Aspiration pneumonia ([place]) 2010  . Headache, chronic daily   . History of migraine     last migraine a couple of days ago;takes Excedrin Migraine  . Chronic back pain     compression fracture  . History of colon polyps   . Mood change ([place])     after  Brain surgery mood changed and was placed on Depakote  . Insomnia     takes Benadryl nightly  . Stroke [facility]) 1998    Brain Aneurysm  . Prostate cancer ([place]) 2004  . Lung cancer [facility])    Current Outpatient Prescriptions on File Prior to Visit  Medication Sig Dispense Refill  . ARIPiprazole (ABILIFY) 10 MG tablet Take 5 mg by mouth daily.     . Ascorbic Acid (VITAMIN C) 1000 MG tablet Take 1,000 mg by mouth daily.    Marland Kitchen aspirin-acetaminophen-caffeine (EXCEDRIN MIGRAINE) 250-250-65 MG per tablet Take 2 tablets by mouth every 6 (six) hours as needed. For headaches    . budesonide (PULMICORT) 0.25 MG/2ML nebulizer solution Take 4 mLs (0.5 mg total) by nebulization 2 (two) times daily. 60 mL 3  . cetirizine (ZYRTEC) 10 MG tablet TAKE 1 TABLET BY MOUTH DAILY. 30 tablet 10  . cholecalciferol (VITAMIN D) 1000 UNITS tablet Take 1,000 Units by mouth  every morning.     . clotrimazole-betamethasone (LOTRISONE) cream Apply 1 application topically 2 (two) times daily. 45 g 1  . dextromethorphan-guaiFENesin (MUCINEX DM) 30-600 MG 12hr tablet Take 1 tablet by mouth 2 (two) times daily.    . diazepam (VALIUM) 5 MG tablet Take 5 mg by mouth 3 (three) times daily. scheduled    . divalproex (DEPAKOTE) 500 MG 24 hr tablet Take 1,000 mg by mouth at bedtime.     . docusate sodium (COLACE) 100 MG capsule Take 200 mg by mouth at bedtime.     . DULoxetine (CYMBALTA) 60 MG capsule Take 120 mg by mouth daily.     . fluconazole (DIFLUCAN) 100 MG tablet Take 1 tablet (100 mg total) by mouth daily. 7 tablet 0  . fluticasone (FLONASE) 50 MCG/ACT nasal spray PLACE 2 SPRAYS INTO BOTH NOSTRILS DAILY. (Patient taking differently: PLACE 2 SPRAYS INTO BOTH NOSTRILS DAILY as needed for allergies) 16 g 2  . HYDROcodone-acetaminophen (NORCO) 10-325 MG per tablet Take 1 tablet by mouth every 6 (six) hours as needed for moderate pain. for pain  0  . ipratropium-albuterol (DUONEB) 0.5-2.5 (3) MG/3ML SOLN Take 3 mLs by nebulization every 6 (six) hours. 360 mL 3  . levofloxacin (LEVAQUIN) 750 MG tablet Take 1 tablet (750 mg total) by mouth daily. 7 tablet 0  . losartan (COZAAR) 50 MG tablet Take 1 tablet (50 mg total) by mouth daily. 30 tablet 6  . metoprolol succinate (TOPROL-XL) 50 MG 24 hr tablet TAKE 1 TABLET (50 MG TOTAL) BY MOUTH EVERY MORNING. TAKE WITH OR IMMEDIATELY FOLLOWING A MEAL. 90 tablet 1  . montelukast (SINGULAIR) 10 MG tablet Take 1 tablet (10 mg total) by mouth at bedtime. 90 tablet 1  . NITROSTAT 0.4 MG SL tablet PLACE 1 TABLET (0.4 MG TOTAL) UNDER THE TONGUE EVERY 5 (FIVE) MINUTES AS NEEDED FOR CHEST PAIN. 25 tablet 1  . omeprazole (PRILOSEC) 40 MG capsule Take 40 mg by mouth 2 (two) times daily.     . polyethylene glycol (MIRALAX / GLYCOLAX) packet Take 17 g by mouth 3 (three) times daily as needed for moderate constipation. For constipation    . predniSONE  (DELTASONE) 10 MG tablet Take prednisone '10mg'$  daily for 3 weeks, then 5 mg daily for 3 weeks. 35 tablet 0  . predniSONE (DELTASONE) 20 MG tablet Take 20 mg by mouth daily with breakfast.    . primidone (MYSOLINE) 250 MG tablet TAKE 1 TABLET (250 MG TOTAL) BY MOUTH 2 (TWO) TIMES DAILY. 180 tablet  1  . promethazine (PHENERGAN) 25 MG tablet Take 25 mg by mouth every 6 (six) hours as needed. Reported on 04/06/2015  1  . Respiratory Therapy Supplies (FLUTTER) DEVI Use as directed. 1 each 0  . simvastatin (ZOCOR) 40 MG tablet TAKE 1 TABLET (40 MG TOTAL) BY MOUTH EVERY EVENING. 90 tablet 3  . tiZANidine (ZANAFLEX) 4 MG tablet TAKE 1 TABLET BY MOUTH EVERY 8 HOURS AS NEEDED FOR MUSCLE SPASMS. 90 tablet 1  . Zinc 50 MG TABS Take 50 mg by mouth daily.      No current facility-administered medications on file prior to visit.      Current Medications, Allergies, Complete Past Medical History, Past Surgical History, Family History, and Social History were reviewed in [employer] record.  ROS  The following are not active complaints unless bolded sore throat,   dental problems, itching, sneezing,  nasal congestion or excess/ purulent secretions, ear ache,   fever, chills, sweats, unintended wt loss, classically pleuritic or exertional cp, hemoptysis,  orthopnea pnd or leg swelling, presyncope, palpitations, abdominal pain, anorexia, nausea, vomiting, diarrhea  or change in bowel or bladder habits, change in stools or urine, dysuria,hematuria,  rash, arthralgias, visual complaints, headache, numbness, weakness or ataxia or problems with walking or coordination,  change in mood/affect or memory.           Objective:   Physical Exam  GEN: A/Ox3; pleasant , NAD, frail and elderly in WC  Filed Vitals:   06/25/15 1435  BP: 140/94  Pulse: 96  Temp: 98.2 F (36.8 C)  TempSrc: Oral  Height: '5\' 10"'$  (1.778 m)  Weight: 160 lb (72.576 kg)  SpO2: 93%       HEENT:  [place]/AT,  EACs-clear,  TMs-wnl, NOSE-clear, THROAT-clear, no lesions, no postnasal drip or exudate noted. Very poor dentition.   NECK:  Supple w/ fair ROM; no JVD; normal carotid impulses w/o bruits; no thyromegaly or nodules palpated; no lymphadenopathy.  RESP Decreased BS in bases , few scattered rhonchi .  no accessory muscle use, no dullness to percussion  CARD:  RRR, no m/r/g  , no peripheral edema, pulses intact, no cyanosis or clubbing.  GI:   Soft & nt; nml bowel sounds; no organomegaly or masses detected.  Musco: Warm bil, no deformities or joint swelling noted. Right leg brace.   Neuro: alert, no focal deficits noted.    Skin: Warm, no lesions or rashes    Calley Drenning NP-C  [place] [facility]  05/02/15     Assessment & Plan:

## 2015-06-25 NOTE — Telephone Encounter (Signed)
Pt wife is requesting rec's patient's current symptoms.  Pt using flutter device, Mucinex and Prednisone as directed. Pt got much worse over the weekend with HA, sore throat, weak and chest tightness.  Pt is asking to go to [facility] but is not wanting to sit in the waiting room d/t feeling so sick and weak.  Pt wants to know if there is any way to speed up the process to get him seen if they know he is coming.  Aware that I will send to Dr [name] to advise.

## 2015-06-25 NOTE — Telephone Encounter (Signed)
Pt added on with TP at 3pm - aware to arrive at 230. Will send to [facility] as FYI. Nothing further needed.

## 2015-06-25 NOTE — Assessment & Plan Note (Signed)
Admit from office to [facility]  See h/p note

## 2015-06-26 ENCOUNTER — Telehealth: Payer: Self-pay | Admitting: Family Medicine

## 2015-06-26 DIAGNOSIS — R1310 Dysphagia, unspecified: Secondary | ICD-10-CM

## 2015-06-26 DIAGNOSIS — R0989 Other specified symptoms and signs involving the circulatory and respiratory systems: Secondary | ICD-10-CM

## 2015-06-26 LAB — BASIC METABOLIC PANEL
ANION GAP: 8 (ref 5–15)
BUN: 17 mg/dL (ref 6–20)
CALCIUM: 9.2 mg/dL (ref 8.9–10.3)
CO2: 28 mmol/L (ref 22–32)
Chloride: 93 mmol/L — ABNORMAL LOW (ref 101–111)
Creatinine, Ser: 0.79 mg/dL (ref 0.61–1.24)
GFR calc Af Amer: >60 mL/min (ref >60)
GFR calc non Af Amer: >60 mL/min (ref >60)
GLUCOSE: 103 mg/dL — AB (ref 65–99)
Potassium: 5.1 mmol/L (ref 3.5–5.1)
Sodium: 129 mmol/L — ABNORMAL LOW (ref 135–145)

## 2015-06-26 LAB — CBC
HCT: 35.8 % — ABNORMAL LOW (ref 39.0–52.0)
HEMOGLOBIN: 11.9 g/dL — AB (ref 13.0–17.0)
MCH: 28 pg (ref 26.0–34.0)
MCHC: 33.2 g/dL (ref 30.0–36.0)
MCV: 84.2 fL (ref 78.0–100.0)
Platelets: 297 10*3/uL (ref 150–400)
RBC: 4.25 MIL/uL (ref 4.22–5.81)
RDW: 15.4 % (ref 11.5–15.5)
WBC: 6.7 10*3/uL (ref 4.0–10.5)

## 2015-06-26 LAB — URINALYSIS, ROUTINE W REFLEX MICROSCOPIC
BILIRUBIN URINE: NEGATIVE
GLUCOSE, UA: NEGATIVE mg/dL
HGB URINE DIPSTICK: NEGATIVE
KETONES UR: NEGATIVE mg/dL
LEUKOCYTES UA: NEGATIVE
Nitrite: NEGATIVE
PH: 8 (ref 5.0–8.0)
Protein, ur: NEGATIVE mg/dL
Specific Gravity, Urine: 1.014 (ref 1.005–1.030)

## 2015-06-26 LAB — GLUCOSE, CAPILLARY: GLUCOSE-CAPILLARY: 144 mg/dL — AB (ref 65–99)

## 2015-06-26 MED ORDER — METHYLPREDNISOLONE SODIUM SUCC 40 MG IJ SOLR
40.0000 mg | Freq: Two times a day (BID) | INTRAMUSCULAR | Status: DC
  Administered 2015-06-26 – 2015-06-27 (×2): 40 mg via INTRAVENOUS
  Filled 2015-06-26 (×2): qty 1

## 2015-06-26 NOTE — Evaluation (Signed)
Physical Therapy Evaluation Patient Details Name: Malik Zuniga MRN: [id number] DOB: 04-04-1945 Today's Date: 06/26/2015   History of Present Illness  70 yo male admitted with aspiratiaon pna. Hx of lung cancer, recurrent asp pna, COPD, chronic back pain-multiple back surgeries, bipolar d/o, dysphagia, HTN  Clinical Impression  On eval, pt required Min guard-Min assist for mobility-walked ~125 feet with RW. Pt tolerated distance fairly well-denied dyspnea. Discussed d/c plan-pt will return home with wife assisting. Pt states he would like to resume OP PT after discharge.     Follow Up Recommendations Outpatient PT;Supervision/Assistance - 24 hour (resume OP PT)    Equipment Recommendations  None recommended by PT    Recommendations for Other Services       Precautions / Restrictions Precautions Precautions: Fall Restrictions Weight Bearing Restrictions: No      Mobility  Bed Mobility Overal bed mobility: Needs Assistance Bed Mobility: Sit to Supine       Sit to supine: Supervision   General bed mobility comments: for safety/lines  Transfers Overall transfer level: Needs assistance Equipment used: Rolling walker (2 wheeled) Transfers: Sit to/from Stand Sit to Stand: Min guard         General transfer comment: close guard for safety.   Ambulation/Gait Ambulation/Gait assistance: Min assist;Min guard Ambulation Distance (Feet): 125 Feet Assistive device: Rolling walker (2 wheeled) Gait Pattern/deviations: Step-through pattern;Decreased stride length;Trunk flexed     General Gait Details: Intermittent assist needed to steady. Slow gait speed. Pt tolerated distance fairly well.   Stairs            Wheelchair Mobility    Modified Rankin (Stroke Patients Only)       Balance Overall balance assessment: Needs assistance         Standing balance support: Bilateral upper extremity supported;During functional activity Standing balance-Leahy Scale:  Poor                               Pertinent Vitals/Pain Pain Assessment: No/denies pain    Home Living Family/patient expects to be discharged to:: Private residence Living Arrangements: Spouse/significant other Available Help at Discharge: Family;Available 24 hours/day Type of Home: House Home Access: Stairs to enter Entrance Stairs-Rails: Right Entrance Stairs-Number of Steps: 5 Home Layout: One level Home Equipment: Walker - 2 wheels;Walker - 4 wheels;Cane - single point;Bedside commode;Grab bars - tub/shower;Tub bench;Wheelchair - manual      Prior Function Level of Independence: Needs assistance   Gait / Transfers Assistance Needed: Amb with walker. Uses w/c in the community           Hand Dominance        Extremity/Trunk Assessment   Upper Extremity Assessment: Overall WFL for tasks assessed           Lower Extremity Assessment: Generalized weakness      Cervical / Trunk Assessment: Kyphotic  Communication   Communication: No difficulties  Cognition Arousal/Alertness: Awake/alert Behavior During Therapy: WFL for tasks assessed/performed Overall Cognitive Status: Within Functional Limits for tasks assessed                      General Comments      Exercises        Assessment/Plan    PT Assessment Patient needs continued PT services  PT Diagnosis Difficulty walking;Generalized weakness   PT Problem List Decreased strength;Decreased activity tolerance;Decreased balance;Decreased mobility  PT Treatment Interventions DME instruction;Gait training;Functional mobility training;Therapeutic  activities;Patient/family education;Balance training;Therapeutic exercise   PT Goals (Current goals can be found in the Care Plan section) Acute Rehab PT Goals Patient Stated Goal: pt states he would like to finish his OP PT program-a few visits left PT Goal Formulation: With patient Time For Goal Achievement: 07/10/15 Potential to Achieve  Goals: Good    Frequency Min 3X/week   Barriers to discharge        Co-evaluation               End of Session   Activity Tolerance: Patient tolerated treatment well Patient left: in bed;with call bell/phone within reach;with bed alarm set           Time: 8032-1224 PT Time Calculation (min) (ACUTE ONLY): 10 min   Charges:   PT Evaluation $PT Eval Low Complexity: 1 Procedure     PT G Codes:        Weston Anna, MPT Pager: [phone]

## 2015-06-26 NOTE — Telephone Encounter (Signed)
Noted, Dr. [name] has received the hospital notes.

## 2015-06-26 NOTE — Progress Notes (Signed)
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     SUBJECTIVE: Pt reports feeling better. Able to cough and clear secretions but they have slowed.  Patient disappointed with diet recommendations - liquid pancake.  "I have very little control over anything else in life and food is important".  Previously was participating with PT.     VITAL SIGNS: Temp:  [97.6 F (36.4 C)-98.2 F (36.8 C)] 97.8 F (36.6 C) (05/16 0610) Pulse Rate:  [77-96] 77 (05/16 0610) Resp:  [18-20] 18 (05/16 0610) BP: (132-150)/(79-94) 142/85 mmHg (05/16 0610) SpO2:  [92 %-97 %] 97 % (05/16 0610) Weight:  [159 lb 9.6 oz (72.394 kg)-163 lb 4.8 oz (74.072 kg)] 159 lb 9.6 oz (72.394 kg) (05/16 5573)  PHYSICAL EXAMINATION: General:  Chronically ill appearing male in NAD, lying in bed HEENT: [place]/AT,NOSE-clear, THROAT-clear, no lesions, no postnasal drip or exudate noted. Very poor dentition.  NECK: Supple w/ fair ROM; no JVD; normal carotid impulses w/o bruits; no thyromegaly or nodules palpated; no lymphadenopathy.  RESP: Decreased BS in bases, scattered rhonchi, no accessory muscle use CARD: RRR, no m/r/g, no peripheral edema, pulses intact, no cyanosis or clubbing. GI: Soft & nt; nml bowel sounds  Musco: Warm bil, no deformities or joint swelling noted.   Neuro: alert, no focal deficits noted.  Skin: Warm, no lesions or rashes   Recent Labs Lab 06/25/15 1734 06/26/15 0432  NA 129* 129*  K 3.9 5.1  CL 93* 93*  CO2 28 28  BUN 17 17  CREATININE 0.72 0.79  GLUCOSE 105* 103*    Recent Labs Lab 06/21/15 1451 06/25/15 1734 06/26/15 0432    HGB 11.5* 11.7* 11.9*  HCT 34.5* 34.1* 35.8*  WBC 8.0 6.0 6.7  PLT 367.0 251 297   X-ray Chest Pa And Lateral  06/25/2015  CLINICAL DATA:  Aspiration pneumonia, productive cough, lung cancer, hypertension, COPD, former smoker EXAM: CHEST  2 VIEW COMPARISON:  06/21/2015 FINDINGS: Normal heart size, mediastinal contours and pulmonary vascularity. Emphysematous changes with RIGHT basilar subsegmental atelectasis. Remaining lungs clear. No pleural effusion or pneumothorax. Osseous demineralization with prior thoracolumbar spinal fixation. Compression fracture T8 again noted. IMPRESSION: COPD changes with subsegmental atelectasis RIGHT base. No acute infiltrate. Electronically Signed   By: Lavonia Dana M.D.   On: 06/25/2015 21:21   SIGNIFICANT EVENTS  5/15  Admit from office    STUDIES:  04/2009: FEV1 1.65 (47%), FEV1% 49  CT chest 10/2013: RLL infiltrates CXR 10/2013: LLL infiltrate 10/2014 CT of the chest showed some increasing prominence of one focus of soft tissue peripherally persistent, and progressive from prior CT. Consequently PET/CT was done on 11/16/14. This showed that the RLL nodule measured 1.3 cm with an SUV max equal to 3.5. CT biopsy was performed on 12/01/14 revealing adenocarcinoma.  PFT on 11/13/2014 showed a post bronchodilator FEV1 at 45%, ratio 45, FVC 74%, positive bronchodilator response.  CULTURES:  ABX: Vanco 5/15 >> Cefepime 5/15 >>   ASSESSMENT / PLAN:  1. Recurrent Aspiration PNA - pt with underlying dysphagia along with chronic narcotic use contributing to recurrent aspiration + immunosuppression with steroids  Plan: Trend CXR intermittently  Pulmonary hygiene - IS, mobilize, chest vest, flutter Continue empiric IV abx > vanco / cefepime  Follow sputum culture  2. COPD - Mild flare   Plan:  Cont pulmicort and Duoneb  Reduce prednisone to solumedrol '40mg'$  q12 with taper to oral steroids as able.  Continue singulair, claritin   3. Chronic back  pain  Plan:  Limit narcotics as able.  Change valium to as needed.  Hold zanaflex.   4. Bipolar Disorder   Plan:  Cont home meds > abilify, cymbalta  5. HTN, HLD  Plan: Cont home Rx >> toprol xl 50 mg, cozaar, lipitor    6 . Dysphagia - mild dysphagia on MBS 11/2013, recommendations for aspiration precautions.   Plan:  Aspiration precautions  Change diet to regular consistency.  Patient understands risk of aspiration and accepting of risks.  Have discussed concept of mechanical ventilation due to aspiration and they want to further discuss.  Limit narcotics as able  PPI   7.  Deconditioning / Weakness / Kyphosis   Plan: PT evaluation Push mobilization  [name], NP-C [facility] Pgr: [phone] or if no answer [phone] 06/26/2015, 8:56 AM

## 2015-06-26 NOTE — Progress Notes (Signed)
Chest vest done with no complications.

## 2015-06-26 NOTE — Telephone Encounter (Signed)
Wife states that pt is back in hosp and wanted Dr [name] to know.

## 2015-06-27 ENCOUNTER — Inpatient Hospital Stay (HOSPITAL_COMMUNITY): Payer: Medicare Other

## 2015-06-27 DIAGNOSIS — J441 Chronic obstructive pulmonary disease with (acute) exacerbation: Secondary | ICD-10-CM

## 2015-06-27 LAB — GLUCOSE, CAPILLARY: Glucose-Capillary: 118 mg/dL — ABNORMAL HIGH (ref 65–99)

## 2015-06-27 LAB — EXPECTORATED SPUTUM ASSESSMENT W REFEX TO RESP CULTURE

## 2015-06-27 MED ORDER — SODIUM CHLORIDE 0.9 % IV SOLN
INTRAVENOUS | Status: DC
  Administered 2015-06-29: via INTRAVENOUS

## 2015-06-27 MED ORDER — METHYLPREDNISOLONE SODIUM SUCC 40 MG IJ SOLR
40.0000 mg | INTRAMUSCULAR | Status: DC
  Administered 2015-06-28: 40 mg via INTRAVENOUS
  Filled 2015-06-27: qty 1

## 2015-06-27 NOTE — Progress Notes (Signed)
Pt refused to do vest therapy this am.

## 2015-06-27 NOTE — Progress Notes (Signed)
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     SUBJECTIVE: Pt very sleepy this am. Reports he did not rest well last night. No acute events.  Tolerated chest vest.      VITAL SIGNS: Temp:  [97.5 F (36.4 C)-97.9 F (36.6 C)] 97.9 F (36.6 C) (05/17 0418) Pulse Rate:  [89-95] 89 (05/17 0418) Resp:  [18] 18 (05/17 0418) BP: (119-139)/(69-85) 139/85 mmHg (05/17 0418) SpO2:  [92 %-97 %] 97 % (05/17 0418) Weight:  [159 lb 6.4 oz (72.303 kg)] 159 lb 6.4 oz (72.303 kg) (05/17 0418)  PHYSICAL EXAMINATION: General:  Chronically ill appearing male in NAD, lying in bed HEENT: [place]/AT,NOSE-clear, THROAT-clear, no lesions, no postnasal drip or exudate noted. Very poor dentition.  NECK: Supple w/ fair ROM; no JVD; normal carotid impulses w/o bruits; no thyromegaly or nodules palpated; no lymphadenopathy.  RESP: Decreased BS in bases, improved rhonchi, no accessory muscle use CARD: RRR, no m/r/g, no peripheral edema, pulses intact, no cyanosis or clubbing. GI: Soft & nt; nml bowel sounds  Musco: Warm bil, no deformities or joint swelling noted. Kyphosis, sleeps slumped over with chin to chest  Neuro: alert, no focal deficits noted  Skin: Warm, no lesions or rashes   Recent Labs Lab 06/25/15 1734 06/26/15 0432  NA 129* 129*  K 3.9 5.1  CL 93* 93*  CO2 28 28  BUN 17 17  CREATININE 0.72 0.79  GLUCOSE 105* 103*    Recent Labs Lab 06/21/15 1451 06/25/15 1734 06/26/15 0432  HGB 11.5* 11.7* 11.9*  HCT 34.5* 34.1* 35.8*  WBC 8.0 6.0 6.7  PLT 367.0 251 297   X-ray Chest Pa And Lateral  06/25/2015  CLINICAL DATA:   Aspiration pneumonia, productive cough, lung cancer, hypertension, COPD, former smoker EXAM: CHEST  2 VIEW COMPARISON:  06/21/2015 FINDINGS: Normal heart size, mediastinal contours and pulmonary vascularity. Emphysematous changes with RIGHT basilar subsegmental atelectasis. Remaining lungs clear. No pleural effusion or pneumothorax. Osseous demineralization with prior thoracolumbar spinal fixation. Compression fracture T8 again noted. IMPRESSION: COPD changes with subsegmental atelectasis RIGHT base. No acute infiltrate. Electronically Signed   By: Lavonia Dana M.D.   On: 06/25/2015 21:21   Dg Chest Port 1 View  06/27/2015  CLINICAL DATA:  Aspiration pneumonia. EXAM: PORTABLE CHEST 1 VIEW COMPARISON:  06/25/2015. FINDINGS: Mediastinum hilar structures are normal. RIght base subsegmental atelectasis and or infiltrate again noted. No pleural effusion or pneumothorax. Thoracolumbar spine fusion. IMPRESSION: Persistent right base subsegmental atelectasis and or infiltrate. No interim change. Electronically Signed   By: Marcello Moores  Register   On: 06/27/2015 07:22   SIGNIFICANT EVENTS  5/15  Admit from office    STUDIES:  04/2009: FEV1 1.65 (47%), FEV1% 49  CT chest 10/2013: RLL infiltrates CXR 10/2013: LLL infiltrate 10/2014 CT of the chest showed some increasing prominence of one focus of soft tissue peripherally persistent, and progressive from prior CT. Consequently PET/CT was done on 11/16/14. This showed that the RLL nodule measured 1.3 cm with an SUV max equal to 3.5. CT biopsy was performed on 12/01/14 revealing adenocarcinoma.  PFT on 11/13/2014 showed a post bronchodilator  FEV1 at 45%, ratio 45, FVC 74%, positive bronchodilator response.  CULTURES:  ABX: Vanco 5/15 >> Cefepime 5/15 >>   ASSESSMENT / PLAN:  1. Recurrent Aspiration PNA - pt with underlying dysphagia along with chronic narcotic use contributing to recurrent aspiration + immunosuppression with steroids   Plan: Trend CXR  intermittently  Pulmonary hygiene - IS, mobilize, chest vest, flutter Continue empiric IV abx > cefepime  D/C vanco 5/17 Follow sputum culture  2. COPD - Mild flare   Plan:  Cont pulmicort and Duoneb  Reduce prednisone to solumedrol '40mg'$  q12 with taper to oral steroids as able.  Continue singulair, claritin   3. Chronic back pain  Plan:  Limit narcotics as able.  Change valium to as needed.  Hold zanaflex.   4. Bipolar Disorder   Plan:  Cont home meds > abilify, cymbalta  5. HTN, HLD  Plan: Cont home Rx >> toprol xl 50 mg, cozaar, lipitor    6 . Dysphagia - mild dysphagia on MBS 11/2013, recommendations for aspiration precautions.   Plan:  Aspiration precautions  Change diet to regular consistency.  Patient understands risk of aspiration and accepting of risks.  Have discussed concept of mechanical ventilation due to aspiration and they want to further discuss.  Limit narcotics as able  PPI   7.  Deconditioning / Weakness / Kyphosis   Plan: PT evaluation > home rec's for continuing outpatient PT efforts Push mobilization  8.  Hyponatremia   Plan: NS @ 77m/hr  BNoe Gens NP-C [facility] Pgr: [phone] or if no answer [phone]/17/2017, 9:08 AM   CC: Dr. TBirdie Riddle

## 2015-06-28 ENCOUNTER — Telehealth: Payer: Self-pay | Admitting: Family Medicine

## 2015-06-28 ENCOUNTER — Ambulatory Visit: Payer: Medicare Other | Admitting: Physical Therapy

## 2015-06-28 LAB — GLUCOSE, CAPILLARY: GLUCOSE-CAPILLARY: 108 mg/dL — AB (ref 65–99)

## 2015-06-28 MED ORDER — CLOTRIMAZOLE-BETAMETHASONE 1-0.05 % EX CREA: 1.0000 "application " | TOPICAL_CREAM | Freq: Two times a day (BID) | CUTANEOUS | Status: DC

## 2015-06-28 MED ORDER — AMOXICILLIN-POT CLAVULANATE 875-125 MG PO TABS
1.0000 | ORAL_TABLET | Freq: Two times a day (BID) | ORAL | Status: DC
  Administered 2015-06-28 – 2015-06-29 (×3): 1 via ORAL
  Filled 2015-06-28 (×3): qty 1

## 2015-06-28 MED ORDER — PREDNISONE 20 MG PO TABS
40.0000 mg | ORAL_TABLET | Freq: Every day | ORAL | Status: DC
  Administered 2015-06-29: 40 mg via ORAL
  Filled 2015-06-28: qty 2

## 2015-06-28 MED ORDER — IPRATROPIUM-ALBUTEROL 0.5-2.5 (3) MG/3ML IN SOLN
3.0000 mL | Freq: Four times a day (QID) | RESPIRATORY_TRACT | Status: DC
  Administered 2015-06-28 – 2015-06-29 (×4): 3 mL via RESPIRATORY_TRACT
  Filled 2015-06-28 (×4): qty 3

## 2015-06-28 NOTE — Progress Notes (Signed)
Pt states he has no Home Health or DME needs at present time.  He has a nebulizer machine at home.

## 2015-06-28 NOTE — Progress Notes (Addendum)
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     SUBJECTIVE:  Pt reports feeling better.  Denies SOB.  Able to cough and clear secretions.Tolerating Chest PT     VITAL SIGNS: Temp:  [97.6 F (36.4 C)-98.1 F (36.7 C)] 98.1 F (36.7 C) (05/18 0442) Pulse Rate:  [95-98] 95 (05/18 0442) Resp:  [18] 18 (05/18 0442) BP: (143-159)/(76-83) 159/83 mmHg (05/18 0442) SpO2:  [95 %-98 %] 98 % (05/18 0442) Weight:  [164 lb 11.2 oz (74.707 kg)] 164 lb 11.2 oz (74.707 kg) (05/18 0442)  PHYSICAL EXAMINATION: General:  Chronically ill appearing male in NAD, lying in bed HEENT: [place]/AT,NOSE-clear, THROAT-clear, no lesions, no postnasal drip or exudate noted. Very poor dentition.  NECK: no JVD; normal carotid impulses w/o bruits RESP: Decreased BS in bases, improved rhonchi, no accessory muscle use CARD: RRR, no m/r/g, no peripheral edema, pulses intact, no cyanosis or clubbing. GI: Soft & nt; nml bowel sounds  Musco: Warm bil, no deformities or joint swelling noted. Kyphosis Neuro: alert, no focal deficits noted  Skin: Warm, no lesions or rashes   Recent Labs Lab 06/25/15 1734 06/26/15 0432  NA 129* 129*  K 3.9 5.1  CL 93* 93*  CO2 28 28  BUN 17 17  CREATININE 0.72 0.79  GLUCOSE 105* 103*    Recent Labs Lab 06/21/15 1451 06/25/15 1734 06/26/15 0432  HGB 11.5* 11.7* 11.9*  HCT 34.5* 34.1* 35.8*  WBC 8.0 6.0 6.7  PLT 367.0 251 297   Dg Chest Port 1 View  06/27/2015  CLINICAL DATA:  Aspiration pneumonia. EXAM: PORTABLE CHEST 1 VIEW COMPARISON:  06/25/2015. FINDINGS: Mediastinum hilar structures are normal. RIght  base subsegmental atelectasis and or infiltrate again noted. No pleural effusion or pneumothorax. Thoracolumbar spine fusion. IMPRESSION: Persistent right base subsegmental atelectasis and or infiltrate. No interim change. Electronically Signed   By: Marcello Moores  Register   On: 06/27/2015 07:22   SIGNIFICANT EVENTS  5/15  Admit from office    STUDIES:  04/2009: FEV1 1.65 (47%), FEV1% 49  CT chest 10/2013: RLL infiltrates CXR 10/2013: LLL infiltrate 10/2014 CT of the chest showed some increasing prominence of one focus of soft tissue peripherally persistent, and progressive from prior CT. Consequently PET/CT was done on 11/16/14. This showed that the RLL nodule measured 1.3 cm with an SUV max equal to 3.5. CT biopsy was performed on 12/01/14 revealing adenocarcinoma.  PFT on 11/13/2014 showed a post bronchodilator FEV1 at 45%, ratio 45, FVC 74%, positive bronchodilator response.  CULTURES:  ABX: Vanco 5/15 >> 5/17 Cefepime 5/15 >> 5/18 Augmentin 5/18 >>   ASSESSMENT / PLAN:  1. Recurrent Aspiration PNA - pt with underlying dysphagia along with chronic narcotic use contributing to recurrent aspiration + immunosuppression with steroids   Plan: Trend CXR intermittently  Pulmonary hygiene - IS, mobilize, chest vest, flutter Continue empiric IV abx > transition to Augmentin in prep for d/c.  D/C cefepime after one more dose, stop date in place Aspiration precautions  2. COPD - Mild flare   Plan:  Cont pulmicort and Duoneb  Transition to Prednisone 40 mg QD 5/18 Continue  singulair, claritin   3. Chronic back pain  Plan:  Limit narcotics as able.  Change valium to as needed.  Hold zanaflex.   4. Bipolar Disorder   Plan:  Cont home meds > abilify, cymbalta  5. HTN, HLD  Plan: Cont home Rx >> toprol xl 50 mg, cozaar, lipitor    6 . Dysphagia - mild dysphagia on MBS 11/2013, recommendations for aspiration precautions.   Plan:  Aspiration precautions  Change diet to regular  consistency.  Patient understands risk of aspiration and accepting of risks.  Have discussed concept of mechanical ventilation due to aspiration and they want to further discuss.  Limit narcotics as able  PPI   7.  Deconditioning / Weakness / Kyphosis   Plan: PT evaluation > home rec's for continuing outpatient PT efforts Push mobilization  8.  Hyponatremia   Plan: NS @ 34m/hr F/u BMP in am   BNoe Gens NP-C [facility] Pgr: [phone] or if no answer [phone]/18/2017, 8:58 AM  Attending:  I have seen and examined the patient with nurse practitioner/resident and agree with the note above.  We formulated the plan together and I elicited the following history.    Mrs. RHinzmanis feeling better Still producing mucus with breathing  On exam Lungs clear in RLL today, better air movement More awake and alert today  Acute COPD exacerbation> weaning off prednisone as an outpatient, continue bronchodilators Mucociliary clearance> I think he would benefit from an IPPV (intermittent positive pressure ventilation nebulizer) as an outpatient, will place social work consult; I also think that narcotic use may be contributing to his sleepiness/lack of pulmonary drive during the daytime so I think he needs to cut back on this at home.  Explained this to him in detail today.  BRoselie Awkward MD [facility] Pager: [phone]: [phone] 3pm or if no response, call [phone]

## 2015-06-28 NOTE — Discharge Summary (Signed)
Physician Discharge Summary  Patient ID: Malik Zuniga MRN: [id number] DOB/AGE: 70/23/1947 70 y.o.  Admit date: 06/25/2015 Discharge date: 06/29/2015    Discharge Diagnoses:  Recurrent Aspiration Pneumonia  COPD with Mild Exacerbation  Dysphagia  Bipolar Disorder HTN HLD  Deconditioning  Kyphosis  Hyponatremia                                                                      DISCHARGE PLAN BY DIAGNOSIS     Recurrent Aspiration PNA - pt with underlying dysphagia along with chronic narcotic use contributing to recurrent aspiration + immunosuppression with steroids   Discharge Plan: Consider follow up CXR in pulmonary office pending symptom review Continue pulmonary hygiene - IS, mobilize, flutter.   Will attempt to arrange for home IPPB Complete 7 days total antibiotics > had levaquin at home, will complete 7 days total abx Continue aspiration precautions  COPD - Mild flare   Discharge Plan:  Cont pulmicort and Duoneb  Prednisone 40 mg QD with slow taper Continue singulair, claritin   Chronic back pain  Discharge Plan: Minimize home narcotic use as able > discussed extensively with patient and wife Change home dosing valium to Q8 as needed PRN zanaflex   Bipolar Disorder   Discharge Plan: Cont home meds > abilify, cymbalta  HTN, HLD   Discharge Plan: Cont home Rx >> toprol xl 50 mg, cozaar, lipitor    Dysphagia - mild dysphagia on MBS 11/2013, recommendations for aspiration precautions.   Discharge Plan: Aspiration precautions  Regular consistency diet. Patient understands risk of aspiration and accepting of risks. Have discussed concept of mechanical ventilation due to aspiration and they want to further discuss. Will follow up with patient in office.   Limit narcotics as able  Continue home omeprazole   Deconditioning / Weakness / Kyphosis   Discharge Plan: Continuing outpatient PT efforts > may return without restriction Push  mobilization  Hyponatremia   Discharge Plan: Resolving, no acute follow up at time of discharge                  DISCHARGE SUMMARY   Malik Zuniga is a 70 y.o. 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    At baseline, the patient is wheelchair / walker dependent.  He lives with his wife who is his primary support system.  He most recently has been participating with outpatient PT.  He was seen in the pulmonary office on 06/11/15 for worsening dyspnea and hypoxia and treated with levaquin for aspiration PNA. The patient called on 5/15 for an acute visit as he felt he was worsening.  After being seen in the office, he was directly admitted to [facility] for further care.  He was treated with IV vancomycin / Cefepime for aspiration pneumonia.  Home narcotic / muscle   relaxants etc were minimized.  The patient was initially placed on a modified diet but after discussion, he wants to continue a regular diet and is accepting of risks of aspiration.  Due to physical limitations, he feels he has little control over much else and wants to continue enjoying his food.  Concept of mechanical ventilation introduced in setting of recurrent aspiration.  They want to continue to think about it. The patient made slow improvement (feel largest contributor to improvement was mucociliary clearance / pulmonary hygiene and limiting narcotics).  He was medically cleared 5/19 for discharge to home.     SIGNIFICANT EVENTS  5/15 Admit from  office   STUDIES:  04/2009: FEV1 1.65 (47%), FEV1% 49  CT chest 10/2013: RLL infiltrates CXR 10/2013: LLL infiltrate 10/2014 CT of the chest showed some increasing prominence of one focus of soft tissue peripherally persistent, and progressive from prior CT. Consequently PET/CT was done on 11/16/14. This showed that the RLL nodule measured 1.3 cm with an SUV max equal to 3.5. CT biopsy was performed on 12/01/14 revealing adenocarcinoma.  PFT on 11/13/2014 showed a post bronchodilator FEV1 at 45%, ratio 45, FVC 74%, positive bronchodilator response.  CULTURES: None   ABX: Vanco 5/15 >> 5/17 Cefepime 5/15 >> 5/18 Augmentin 5/18 >> 5/19  Levaquin for discharge >> complete 7 days total abx  Discharge Exam: General: chronically ill appearing adult male in NAD Neuro: AAOx4, speech clear, MAE  CV: s1s2 rrr, no m/r/g PULM: even/non-labored, lungs bilaterally clear, no accessory muscle use GI: obese/soft, bsx4 active, tolerating PO's, mild cough with ingestion of applesauce Extremities: warm / dry  Filed Vitals:   06/28/15 1428 06/28/15 2049 06/28/15 2106 06/29/15 0443  BP:   150/89 151/84  Pulse:  94 96 90  Temp:   97.9 F (36.6 C) 97.8 F (36.6 C)  TempSrc:   Oral Oral  Resp:  18 20 20  Height:      Weight:    163 lb 6.4 oz (74.118 kg)  SpO2: 92% 98% 97% 97%     Discharge Labs  BMET  Recent Labs Lab 06/25/15 1734 06/26/15 0432 06/29/15 0448  NA 129* 129* 132*  K 3.9 5.1 5.5*  CL 93* 93* 94*  CO2 28 28 30  GLUCOSE 105* 103* 106*  BUN 17 17 14  CREATININE 0.72 0.79 0.67  CALCIUM 9.1 9.2 9.4    CBC  Recent Labs Lab 06/25/15 1734 06/26/15 0432 06/29/15 0448  HGB 11.7* 11.9* 11.7*  HCT 34.1* 35.8* 34.5*  WBC 6.0 6.7 6.7  PLT 251 297 285       Medication List    STOP taking these medications        fluconazole 100 MG tablet  Commonly known as:  DIFLUCAN      TAKE these medications        ARIPiprazole 10 MG tablet  Commonly known as:  ABILIFY   Take 5 mg by mouth daily.     aspirin-acetaminophen-caffeine 250-250-65 MG tablet  Commonly known as:  EXCEDRIN MIGRAINE  Take 2 tablets by mouth every 6 (six) hours as needed. For headaches     budesonide 0.25 MG/2ML nebulizer solution  Commonly known as:  PULMICORT  Take 4 mLs (0.5 mg total) by nebulization 2 (two) times daily.     cetirizine 10 MG tablet  Commonly known as:  ZYRTEC  TAKE 1 TABLET BY MOUTH DAILY.     cholecalciferol 1000 units tablet  Commonly known as:  VITAMIN D    Take 1,000 Units by mouth every morning.     clotrimazole-betamethasone cream  Commonly known as:  LOTRISONE  Apply 1 application topically 2 (two) times daily.     dextromethorphan-guaiFENesin 30-600 MG 12hr tablet  Commonly known as:  MUCINEX DM  Take 1 tablet by mouth 2 (two) times daily.     diazepam 5 MG tablet  Commonly known as:  VALIUM  Take 1 tablet (5 mg total) by mouth every 8 (eight) hours as needed for anxiety or muscle spasms. scheduled     divalproex 500 MG 24 hr tablet  Commonly known as:  DEPAKOTE ER  Take 1,000 mg by mouth at bedtime.     docusate sodium 100 MG capsule  Commonly known as:  COLACE  Take 200 mg by mouth at bedtime.     DULoxetine 60 MG capsule  Commonly known as:  CYMBALTA  Take 120 mg by mouth daily.     fluticasone 50 MCG/ACT nasal spray  Commonly known as:  FLONASE  PLACE 2 SPRAYS INTO BOTH NOSTRILS DAILY.     FLUTTER Devi  Use as directed.     HYDROcodone-acetaminophen 10-325 MG tablet  Commonly known as:  NORCO  Take 1 tablet by mouth every 6 (six) hours as needed for moderate pain. for pain     ipratropium-albuterol 0.5-2.5 (3) MG/3ML Soln  Commonly known as:  DUONEB  Take 3 mLs by nebulization every 6 (six) hours.     levofloxacin 750 MG tablet  Commonly known as:  LEVAQUIN  Take 1 tablet (750 mg total) by mouth daily.     losartan 50 MG tablet  Commonly known as:  COZAAR  Take 1 tablet (50 mg total) by mouth daily.     metoprolol  succinate 50 MG 24 hr tablet  Commonly known as:  TOPROL-XL  TAKE 1 TABLET (50 MG TOTAL) BY MOUTH EVERY MORNING. TAKE WITH OR IMMEDIATELY FOLLOWING A MEAL.     montelukast 10 MG tablet  Commonly known as:  SINGULAIR  Take 1 tablet (10 mg total) by mouth at bedtime.     NITROSTAT 0.4 MG SL tablet  Generic drug:  nitroGLYCERIN  PLACE 1 TABLET (0.4 MG TOTAL) UNDER THE TONGUE EVERY 5 (FIVE) MINUTES AS NEEDED FOR CHEST PAIN.     NYQUIL COLD & FLU PO  Take 2 capsules by mouth at bedtime as needed (sleep).     omeprazole 40 MG capsule  Commonly known as:  PRILOSEC  Take 40 mg by mouth 2 (two) times daily.     polyethylene glycol packet  Commonly known as:  MIRALAX / GLYCOLAX  Take 17 g by mouth 3 (three) times daily as needed for moderate constipation. For constipation     predniSONE 20 MG tablet  Commonly known as:  DELTASONE  2 tabs daily for 4 days, then 1 1/2 tab daily for 4 days, then 1 tab daily for 4 days, then 1/2 tab daily for 4 days and stop     primidone 250 MG tablet  Commonly known as:  MYSOLINE  TAKE 1 TABLET (250 MG TOTAL) BY MOUTH 2 (TWO) TIMES DAILY.     promethazine 25 MG tablet  Commonly known as:  PHENERGAN  Take 25 mg by mouth every 6 (six) hours as needed for nausea or vomiting. Reported on 04/06/2015     ranitidine 150 MG tablet  Commonly known as:  ZANTAC  Take 150-300 mg by mouth 2 (two) times daily as needed for heartburn.  simvastatin 40 MG tablet  Commonly known as:  ZOCOR  TAKE 1 TABLET (40 MG TOTAL) BY MOUTH EVERY EVENING.     tiZANidine 4 MG tablet  Commonly known as:  ZANAFLEX  TAKE 1 TABLET BY MOUTH EVERY 8 HOURS AS NEEDED FOR MUSCLE SPASMS.     vitamin C 1000 MG tablet  Take 1,000 mg by mouth daily.     Zinc 50 MG Tabs  Take 50 mg by mouth daily.        Follow-up Information    Follow up with [name], NP On 07/02/2015.   Specialty:  Pulmonary Disease   Why:  Appt at 11:30 AM   Contact information:   [address] [place] [address] [phone]        Disposition:  Home.  Attempt to arrange for IPPB at home.     Discharged Condition: GRAYLIN SPERLING has met maximum benefit of inpatient care and is medically stable and cleared for discharge.  Patient is pending follow up as above.      Time spent on disposition:  Greater than 35 minutes.   Signed: [name], NP-C [facility] Pgr: [phone] Office: [phone]

## 2015-06-28 NOTE — Care Management Important Message (Signed)
Important Message  Patient Details  Name: Malik Zuniga MRN: [id number] Date of Birth: Aug 12, 1945   Medicare Important Message Given:  Yes    Camillo Flaming 06/28/2015, 8:55 AMImportant Message  Patient Details  Name: Malik Zuniga MRN: [id number] Date of Birth: 1945/03/04   Medicare Important Message Given:  Yes    Camillo Flaming 06/28/2015, 8:55 AM

## 2015-06-28 NOTE — Progress Notes (Signed)
Physical Therapy Treatment Patient Details Name: Malik Zuniga MRN: [id number] DOB: 1945-11-02 Today's Date: 06/28/2015    History of Present Illness 70 yo male admitted with aspiratiaon pna. Hx of lung cancer, recurrent asp pna, COPD, chronic back pain-multiple back surgeries, bipolar d/o, dysphagia, HTN    PT Comments    Progressing with mobility. Still requiring intermittent assist to steady (especially when attention focused elsewhere). Recommend daily ambulation with nursing supervision as able.   Follow Up Recommendations  Outpatient PT;Supervision/Assistance - 24 hour (pt would like to resume OP PT)     Equipment Recommendations  None recommended by PT    Recommendations for Other Services       Precautions / Restrictions Precautions Precautions: Fall Restrictions Weight Bearing Restrictions: No    Mobility  Bed Mobility Overal bed mobility: Modified Independent Bed Mobility: Supine to Sit              Transfers Overall transfer level: Needs assistance Equipment used: Standard walker Transfers: Sit to/from Stand Sit to Stand: Supervision         General transfer comment: for safety.  Ambulation/Gait Ambulation/Gait assistance: Min assist Ambulation Distance (Feet): 250 Feet Assistive device: Rolling walker (2 wheeled) Gait Pattern/deviations: Step-through pattern;Decreased stride length;Trunk flexed     General Gait Details: Intermittent assist needed to steady. Pt tolerated distance well. Dyspnea 2/4. O2 sats >90% on RA   Stairs            Wheelchair Mobility    Modified Rankin (Stroke Patients Only)       Balance                                    Cognition Arousal/Alertness: Awake/alert Behavior During Therapy: WFL for tasks assessed/performed Overall Cognitive Status: Within Functional Limits for tasks assessed                      Exercises      General Comments        Pertinent Vitals/Pain  Pain Assessment: No/denies pain    Home Living                      Prior Function            PT Goals (current goals can now be found in the care plan section) Progress towards PT goals: Progressing toward goals    Frequency  Min 3X/week    PT Plan Current plan remains appropriate    Co-evaluation             End of Session Equipment Utilized During Treatment: Gait belt Activity Tolerance: Patient tolerated treatment well Patient left: in chair;with call bell/phone within reach     Time: 1510-1523 PT Time Calculation (min) (ACUTE ONLY): 13 min  Charges:  $Gait Training: 8-22 mins                    G Codes:      Weston Anna, MPT Pager: [phone]

## 2015-06-28 NOTE — Telephone Encounter (Signed)
Medication filled to pharmacy as requested.   

## 2015-06-28 NOTE — Telephone Encounter (Signed)
Pt needs refill on lotrisone, [facility] [address]

## 2015-06-29 ENCOUNTER — Inpatient Hospital Stay (HOSPITAL_COMMUNITY): Payer: Medicare Other

## 2015-06-29 ENCOUNTER — Encounter: Payer: Self-pay | Admitting: Pulmonary Disease

## 2015-06-29 LAB — CBC
HCT: 34.5 % — ABNORMAL LOW (ref 39.0–52.0)
Hemoglobin: 11.7 g/dL — ABNORMAL LOW (ref 13.0–17.0)
MCH: 28 pg (ref 26.0–34.0)
MCHC: 33.9 g/dL (ref 30.0–36.0)
MCV: 82.5 fL (ref 78.0–100.0)
Platelets: 285 10*3/uL (ref 150–400)
RBC: 4.18 MIL/uL — ABNORMAL LOW (ref 4.22–5.81)
RDW: 15.6 % — ABNORMAL HIGH (ref 11.5–15.5)
WBC: 6.7 10*3/uL (ref 4.0–10.5)

## 2015-06-29 LAB — BASIC METABOLIC PANEL
ANION GAP: 8 (ref 5–15)
BUN: 14 mg/dL (ref 6–20)
CO2: 30 mmol/L (ref 22–32)
Calcium: 9.4 mg/dL (ref 8.9–10.3)
Chloride: 94 mmol/L — ABNORMAL LOW (ref 101–111)
Creatinine, Ser: 0.67 mg/dL (ref 0.61–1.24)
GFR calc non Af Amer: >60 mL/min (ref >60)
GLUCOSE: 106 mg/dL — AB (ref 65–99)
POTASSIUM: 5.5 mmol/L — AB (ref 3.5–5.1)
Sodium: 132 mmol/L — ABNORMAL LOW (ref 135–145)

## 2015-06-29 LAB — GLUCOSE, CAPILLARY: Glucose-Capillary: 104 mg/dL — ABNORMAL HIGH (ref 65–99)

## 2015-06-29 MED ORDER — PREDNISONE 20 MG PO TABS: ORAL_TABLET | ORAL | Status: DC

## 2015-06-29 MED ORDER — DIAZEPAM 5 MG PO TABS: 5.0000 mg | ORAL_TABLET | Freq: Three times a day (TID) | ORAL | Status: DC | PRN

## 2015-06-29 MED ORDER — ASPIRIN-ACETAMINOPHEN-CAFFEINE 250-250-65 MG PO TABS
1.0000 | ORAL_TABLET | Freq: Four times a day (QID) | ORAL | Status: DC | PRN
  Administered 2015-06-29: 2 via ORAL
  Filled 2015-06-29 (×3): qty 2

## 2015-06-29 MED ORDER — LEVOFLOXACIN 750 MG PO TABS: 750.0000 mg | ORAL_TABLET | Freq: Every day | ORAL | Status: DC

## 2015-06-29 NOTE — Consult Note (Signed)
   [facility] Inpatient Consult   06/29/2015  CLYDELL SPOSITO 07/10/1945 [id number]    Mr. Cratty has been active with [facility] program. He has been followed by [facility] for chronic disease management. Spoke with both patient and wife at bedside. Discussed that [facility] will follow up post hospital discharge and he will receive monthly home visits if needed. Both patient and wife are agreeable to this. Spoke with inpatient RNCM to make aware [facility] is following. Inpatient RNCM is also following up on orders placed regarding different nebulizer machine. Mr. Scholer slated to discharge home today.   Will request for patient to be assigned to [facility].   Marthenia Rolling, MSN-Ed, RN,BSN [facility] Liaison [phone]

## 2015-06-29 NOTE — Progress Notes (Signed)
Nursing Discharge Summary  Patient ID: Malik Zuniga MRN: [id number] DOB/AGE: 70-Aug-1947 70 y.o.  Admit date: 06/25/2015 Discharge date: 06/29/2015  Discharged Condition: good  Disposition: 01-Home or Self Care  Follow-up Information    Follow up with [name], NP On 07/02/2015.   Specialty:  Pulmonary Disease   Why:  Appt at 11:30 AM   Contact information:   [place] [address] [phone]       Prescriptions Given: Patient medications called into pharmacy.  Patient follow up appointments and medications discussed.  Patient and wife both verbalized understanding.   Means of Discharge: Patient to be taken downstairs by wheelchair to be transported home via private vehicle.  Signed: Buel Ream 06/29/2015, 10:12 AM

## 2015-06-29 NOTE — Progress Notes (Signed)
Patient unavailable to perform CPT this AM.

## 2015-06-29 NOTE — Progress Notes (Signed)
A call to [facility] and [facility] for IPPB machine was made, neither company had the machine. One more try to [facility], information was faxed.

## 2015-07-02 ENCOUNTER — Ambulatory Visit (INDEPENDENT_AMBULATORY_CARE_PROVIDER_SITE_OTHER): Payer: Medicare Other | Admitting: Pulmonary Disease

## 2015-07-02 ENCOUNTER — Other Ambulatory Visit: Payer: Self-pay | Admitting: *Deleted

## 2015-07-02 ENCOUNTER — Other Ambulatory Visit: Payer: Self-pay | Admitting: Pulmonary Disease

## 2015-07-02 ENCOUNTER — Encounter: Payer: Self-pay | Admitting: Pulmonary Disease

## 2015-07-02 ENCOUNTER — Ambulatory Visit: Payer: Medicare Other | Admitting: Physical Therapy

## 2015-07-02 VITALS — BP 130/70 | HR 103 | Temp 98.0°F | Ht 70.0 in | Wt 164.0 lb

## 2015-07-02 DIAGNOSIS — J690 Pneumonitis due to inhalation of food and vomit: Secondary | ICD-10-CM

## 2015-07-02 DIAGNOSIS — I2510 Atherosclerotic heart disease of native coronary artery without angina pectoris: Secondary | ICD-10-CM

## 2015-07-02 DIAGNOSIS — R1310 Dysphagia, unspecified: Secondary | ICD-10-CM

## 2015-07-02 DIAGNOSIS — I2584 Coronary atherosclerosis due to calcified coronary lesion: Secondary | ICD-10-CM

## 2015-07-02 DIAGNOSIS — J449 Chronic obstructive pulmonary disease, unspecified: Secondary | ICD-10-CM | POA: Diagnosis not present

## 2015-07-02 NOTE — Patient Outreach (Signed)
Referral received from hospital liaison to initiate transition of care as member was recently admitted to the hospital with recurrent aspiration pneumonia, discharged Friday, 5/19.  He was recently involved with telephonic health coach for disease management (COPD).    Call placed to member, no answer, unable to leave a voice message.  Will make another attempt to contact member within the next 2 days.  Malik Zuniga, BSN, [facility]  [facility] Care Manager [phone]

## 2015-07-02 NOTE — Patient Instructions (Addendum)
1.  Complete your antibiotics as prescribed 2.  Aspiration precautions - eat / chew slowly, take a sip of fluids after each bite to clear your throat, monitor foods that give you trouble 3.  Complete prednisone taper as prescribed 4.  You may use Mucinex as needed (no longer have to take on a scheduled basis) 5.  Please call if you have new or worsening symptoms - increased shortness of breath, fevers, chills 6. Follow up with Dr. [name] on 6/22 as scheduled

## 2015-07-02 NOTE — Progress Notes (Signed)
Current Outpatient Prescriptions on File Prior to Visit  Medication Sig  . ARIPiprazole (ABILIFY) 10 MG tablet Take 5 mg by mouth daily.   . Ascorbic Acid (VITAMIN C) 1000 MG tablet Take 1,000 mg by mouth daily.  Marland Kitchen aspirin-acetaminophen-caffeine (EXCEDRIN MIGRAINE) 250-250-65 MG per tablet Take 2 tablets by mouth every 6 (six) hours as needed. For headaches  . budesonide (PULMICORT) 0.25 MG/2ML nebulizer solution Take 4 mLs (0.5 mg total) by nebulization 2 (two) times daily.  . cetirizine (ZYRTEC) 10 MG tablet TAKE 1 TABLET BY MOUTH DAILY.  . cholecalciferol (VITAMIN D) 1000 UNITS tablet Take 1,000 Units by mouth every morning.   . clotrimazole-betamethasone (LOTRISONE) cream Apply 1 application topically 2 (two) times daily.  Marland Kitchen dextromethorphan-guaiFENesin (MUCINEX DM) 30-600 MG 12hr tablet Take 1 tablet by mouth 2 (two) times daily.  . diazepam (VALIUM) 5 MG tablet Take 1 tablet (5 mg total) by mouth every 8 (eight) hours as needed for anxiety or muscle spasms. scheduled  . divalproex (DEPAKOTE) 500 MG 24 hr tablet Take 1,000 mg by mouth at bedtime.   Marland Kitchen DM-Doxylamine-Acetaminophen (NYQUIL COLD & FLU PO) Take 2 capsules by mouth at bedtime as needed (sleep).  . docusate sodium (COLACE) 100 MG capsule Take 200 mg by mouth at bedtime.   . DULoxetine (CYMBALTA) 60 MG capsule Take 120 mg by mouth daily.   . fluticasone (FLONASE) 50 MCG/ACT nasal spray PLACE 2 SPRAYS INTO BOTH NOSTRILS DAILY. (Patient taking differently: PLACE 2 SPRAYS INTO BOTH NOSTRILS DAILY as needed for allergies)  . HYDROcodone-acetaminophen (NORCO) 10-325 MG per tablet Take 1 tablet by mouth every 6 (six) hours as needed for moderate pain. for pain  . ipratropium-albuterol (DUONEB) 0.5-2.5 (3) MG/3ML SOLN Take 3 mLs by nebulization every 6 (six) hours.  Marland Kitchen levofloxacin (LEVAQUIN) 750 MG tablet Take 1 tablet (750 mg total) by mouth daily.  Marland Kitchen losartan (COZAAR) 50 MG tablet Take 1 tablet (50 mg total) by mouth daily.  . metoprolol  succinate (TOPROL-XL) 50 MG 24 hr tablet TAKE 1 TABLET (50 MG TOTAL) BY MOUTH EVERY MORNING. TAKE WITH OR IMMEDIATELY FOLLOWING A MEAL.  . montelukast (SINGULAIR) 10 MG tablet Take 1 tablet (10 mg total) by mouth at bedtime.  Marland Kitchen NITROSTAT 0.4 MG SL tablet PLACE 1 TABLET (0.4 MG TOTAL) UNDER THE TONGUE EVERY 5 (FIVE) MINUTES AS NEEDED FOR CHEST PAIN.  Marland Kitchen omeprazole (PRILOSEC) 40 MG capsule Take 40 mg by mouth 2 (two) times daily.   . polyethylene glycol (MIRALAX / GLYCOLAX) packet Take 17 g by mouth 3 (three) times daily as needed for moderate constipation. For constipation  . predniSONE (DELTASONE) 20 MG tablet 2 tabs daily for 4 days, then 1 1/2 tab daily for 4 days, then 1 tab daily for 4 days, then 1/2 tab daily for 4 days and stop  . primidone (MYSOLINE) 250 MG tablet TAKE 1 TABLET (250 MG TOTAL) BY MOUTH 2 (TWO) TIMES DAILY.  Marland Kitchen promethazine (PHENERGAN) 25 MG tablet Take 25 mg by mouth every 6 (six) hours as needed for nausea or vomiting. Reported on 04/06/2015  . ranitidine (ZANTAC) 150 MG tablet Take 150-300 mg by mouth 2 (two) times daily as needed for heartburn.  Marland Kitchen Respiratory Therapy Supplies (FLUTTER) DEVI Use as directed.  . simvastatin (ZOCOR) 40 MG tablet TAKE 1 TABLET (40 MG TOTAL) BY MOUTH EVERY EVENING.  Marland Kitchen tiZANidine (ZANAFLEX) 4 MG tablet TAKE 1 TABLET BY MOUTH EVERY 8 HOURS AS NEEDED FOR MUSCLE SPASMS.  Marland Kitchen Zinc 50 MG  TABS Take 50 mg by mouth daily.    No current facility-administered medications on file prior to visit.     Chief Complaint  Patient presents with  . Follow-up    feeling much better.  no concerns.     Tests:  Past medical hx  has a past medical history of Hyperlipidemia; Tremor; History of prostate cancer; On home O2; Neuromuscular disorder ([place]) (1998); Anemia associated with acute blood loss; GERD (gastroesophageal reflux disease); Hypertension; Constipation; Anxiety; Depression; Emphysema of lung ([place]); Emphysema; Myocardial infarction ([place]) (04/1998);  Coronary artery disease; Asthma; Shortness of breath; History of bronchitis; Aspiration pneumonia ([place]) (2010); Headache, chronic daily; History of migraine; Chronic back pain; History of colon polyps; Mood change ([place]); Insomnia; Stroke ([place]) (1998); Prostate cancer ([place]) (2004); and Lung cancer ([place]).   Past surgical hx, Allergies, Family hx, Social hx all reviewed.  Vital Signs BP 130/70 mmHg  Pulse 103  Temp(Src) 98 F (36.7 C) (Oral)  Ht '5\' 10"'$  (1.778 m)  Wt 164 lb (74.39 kg)  BMI 23.53 kg/m2  SpO2 96%  History of Present Illness Malik Zuniga is a 70 y.o. 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    Hospital course notable for resolving aspiration PNA.  Home narcotic regimen was reduced with significant improvement in mental status/alertness. Patient was treated with IV antibiotics for aspiration pneumonia.  He also was treated for a mild COPD exacerbation.  5/22 is his last day of oral antibiotics. The patient reports significant improvement in symptoms. He reports one episode of coughing and concern for aspiration with eating eggs. Patient denies significant sputum production, cough, chest pain, shortness of breath. He reports that he continues to be weak but is improving.  Physical Exam  General - well developed adult male in no acute distress ENT - No sinus tenderness, no oral exudate, no LAN Cardiac - s1s2 regular, no murmur Chest - even/non-labored, lungs bilaterally clear. No wheeze/rales/rhonchi Back - No focal tenderness Abd  - Soft, non-tender Ext - No edema Neuro - Normal strength Skin - No rashes Psych - normal mood, and behavior   Assessment/Plan  1.  Recurrent Aspiration - discussed extensively diet modifications with patient. He feels that a regular diet is one of the few sources of happiness.  In the past he has attempted diet modifications. The patient is contemplating follow-up with SLP for dietary assessment/modifications. We also discussed the issue of mechanical ventilation in the setting of recurrent aspiration. He indicates he is accepting of short-term support but would not want long-term mechanical ventilation.  2.  Resolving COPD Exacerbation   3.  Dysphagia   4. Kyphosis / Weakness / Deconditioning    Plan: Encouraged dietary modifications as patient can tolerate Asked him to monitor foods that he has difficulty with and to eliminate from his diet. Complete antibiotics as prescribed Complete prednisone as prescribed Patient instructed to call if new or worsening symptoms Follow-up with Dr. [name] as previously planned. Follow-up with Dr. Carles Collet on 5/23.  Consider outpatient SLP Ok to return to PT as tolerated without restrictions   Patient Instructions  1.  Complete your antibiotics as prescribed 2.  Aspiration precautions - eat / chew slowly, take a sip of fluids after each bite to clear your throat, monitor foods that give you trouble 3.  Complete prednisone taper as prescribed 4.  You may use Mucinex as needed (no longer have to take on a scheduled basis) 5.  Please call if you have new or worsening symptoms - increased shortness of breath, fevers, chills 6. Follow up with Dr. [name] on 6/22 as scheduled     [name], NP-C [place]  [phone] 07/02/2015, 12:18 PM

## 2015-07-03 ENCOUNTER — Ambulatory Visit (INDEPENDENT_AMBULATORY_CARE_PROVIDER_SITE_OTHER): Payer: Medicare Other | Admitting: Neurology

## 2015-07-03 ENCOUNTER — Encounter: Payer: Self-pay | Admitting: Neurology

## 2015-07-03 ENCOUNTER — Ambulatory Visit: Payer: Medicare Other | Admitting: Physical Therapy

## 2015-07-03 ENCOUNTER — Other Ambulatory Visit: Payer: Medicare Other

## 2015-07-03 ENCOUNTER — Other Ambulatory Visit (HOSPITAL_COMMUNITY): Payer: Self-pay | Admitting: Neurology

## 2015-07-03 VITALS — BP 106/66 | HR 106

## 2015-07-03 DIAGNOSIS — J690 Pneumonitis due to inhalation of food and vomit: Secondary | ICD-10-CM

## 2015-07-03 DIAGNOSIS — R1310 Dysphagia, unspecified: Secondary | ICD-10-CM

## 2015-07-03 DIAGNOSIS — I2510 Atherosclerotic heart disease of native coronary artery without angina pectoris: Secondary | ICD-10-CM

## 2015-07-03 DIAGNOSIS — R29898 Other symptoms and signs involving the musculoskeletal system: Secondary | ICD-10-CM | POA: Diagnosis not present

## 2015-07-03 DIAGNOSIS — G251 Drug-induced tremor: Secondary | ICD-10-CM | POA: Diagnosis not present

## 2015-07-03 DIAGNOSIS — I2584 Coronary atherosclerosis due to calcified coronary lesion: Secondary | ICD-10-CM | POA: Diagnosis not present

## 2015-07-03 NOTE — Progress Notes (Signed)
Reviewed, I agree with this plan of care 

## 2015-07-03 NOTE — Patient Instructions (Signed)
1. Your provider has requested that you have labwork completed today. Please go to [facility] ([id number]) on the second floor of this building before leaving the office today. You do not need to check in. If you are not called within 15 minutes please check with the front desk.  2. We will call you with an appt for EMG 3. We have scheduled you at [facility] for your modified barium swallow on 07/05/15 at 11:30 am. Please arrive 15 minutes prior and go to 1st floor radiology. If you need to reschedule for any reason please call [phone]. 4. You have been referred to [facility] for speech therapy. They will call you directly to schedule an appointment.  Please call [phone] if you do not hear from them.
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
## 2015-07-04 ENCOUNTER — Other Ambulatory Visit: Payer: Self-pay | Admitting: *Deleted

## 2015-07-04 ENCOUNTER — Telehealth (HOSPITAL_COMMUNITY): Payer: Self-pay | Admitting: *Deleted

## 2015-07-04 LAB — CK: Total CK: 59 U/L (ref 7–232)

## 2015-07-04 NOTE — Telephone Encounter (Signed)
Patient given detailed instructions per Myocardial Perfusion Study Information Sheet for the test on 07/10/15. Patient notified to arrive 15 minutes early and that it is imperative to arrive on time for appointment to keep from having the test rescheduled.  If you need to cancel or reschedule your appointment, please call the office within 24 hours of your appointment. Failure to do so may result in a cancellation of your appointment, and a $50 no show fee. Patient verbalized understanding. Hubbard Robinson, RN

## 2015-07-04 NOTE — Patient Outreach (Addendum)
2nd call placed to member/wife to start transition of care program.  Wife verifies member's identity, states that he is doing "alright."  She denies any concern with his breathing at this time, but does inquire about a special nebulizer machine that the pulmonologist recommended while member was still hospitalized.  She is unaware of the specific name, but state that it is different from the regular nebulizer.  Informed that this care manager will contact pulmonology office to inquire about device.  Attempt made to review medications, wife states "he's taking everything he's supposed to."  Attempted again to read medication list, wife states "He's got it all."  She denies no concern regarding his medications.  Wife/member familiar with [facility] care management as he has previously been involved with transition of care and health coach.  She denies the need for weekly calls, stating her only concern/need is to obtain the special nebulizer.  However, she would like to continue with health coach. Will contact physician and follow up with wife.  Valente Pinkney, BSN, [place] Management  [facility] Care Manager [phone]

## 2015-07-05 ENCOUNTER — Ambulatory Visit (HOSPITAL_COMMUNITY)
Admission: RE | Admit: 2015-07-05 | Discharge: 2015-07-05 | Disposition: A | Discharge status: Home / Self Care | Payer: Medicare Other | Source: Ambulatory Visit | Attending: Neurology | Admitting: Neurology

## 2015-07-05 ENCOUNTER — Ambulatory Visit: Payer: Self-pay | Admitting: *Deleted

## 2015-07-05 DIAGNOSIS — J690 Pneumonitis due to inhalation of food and vomit: Secondary | ICD-10-CM

## 2015-07-05 DIAGNOSIS — Z9981 Dependence on supplemental oxygen: Secondary | ICD-10-CM | POA: Diagnosis not present

## 2015-07-05 DIAGNOSIS — R0989 Other specified symptoms and signs involving the circulatory and respiratory systems: Secondary | ICD-10-CM | POA: Diagnosis not present

## 2015-07-05 DIAGNOSIS — F329 Major depressive disorder, single episode, unspecified: Secondary | ICD-10-CM | POA: Insufficient documentation

## 2015-07-05 DIAGNOSIS — R1310 Dysphagia, unspecified: Secondary | ICD-10-CM | POA: Insufficient documentation

## 2015-07-05 DIAGNOSIS — F419 Anxiety disorder, unspecified: Secondary | ICD-10-CM | POA: Insufficient documentation

## 2015-07-05 DIAGNOSIS — K219 Gastro-esophageal reflux disease without esophagitis: Secondary | ICD-10-CM | POA: Insufficient documentation

## 2015-07-05 DIAGNOSIS — Z8701 Personal history of pneumonia (recurrent): Secondary | ICD-10-CM | POA: Diagnosis not present

## 2015-07-05 DIAGNOSIS — I10 Essential (primary) hypertension: Secondary | ICD-10-CM | POA: Diagnosis not present

## 2015-07-05 DIAGNOSIS — R1312 Dysphagia, oropharyngeal phase: Secondary | ICD-10-CM | POA: Diagnosis not present

## 2015-07-05 DIAGNOSIS — E785 Hyperlipidemia, unspecified: Secondary | ICD-10-CM | POA: Diagnosis not present

## 2015-07-05 LAB — ALDOLASE: ALDOLASE: 3.6 U/L (ref <=8.1)

## 2015-07-06 ENCOUNTER — Other Ambulatory Visit: Payer: Self-pay | Admitting: *Deleted

## 2015-07-06 ENCOUNTER — Telehealth: Payer: Self-pay | Admitting: Pulmonary Disease

## 2015-07-06 DIAGNOSIS — J449 Chronic obstructive pulmonary disease, unspecified: Secondary | ICD-10-CM

## 2015-07-06 NOTE — Telephone Encounter (Signed)
lmtcb x1 for [facility] with [facility].

## 2015-07-06 NOTE — Telephone Encounter (Signed)
Can we call [facility] to see if they offer anything like this? It is an IPPV machine (intermittent positive pressure nebulizer) that helps facilitate mucociliary clearance. An alternative would be to get a therapy vest, but I don't think he has the imaging findings to support this. Anything similar would be helpful.

## 2015-07-06 NOTE — Telephone Encounter (Signed)
Monica returning call from [facility] and can be reached [phone].Hillery Hunter

## 2015-07-06 NOTE — Telephone Encounter (Addendum)
Spoke with [place] at [facility]. States that the pt and his wife are inquiring about an IPPV neb machine. This is in a note from when the pt was in the hospital. Pt and wife would like an order for this .  BQ - please advise. Thanks.

## 2015-07-06 NOTE — Telephone Encounter (Signed)
Spoke with Malik Zuniga. Made her aware that we would be working on this. Called and spoke with pt's wife. She is aware that we try to find a DME that has this type of machine. Order has been placed.

## 2015-07-06 NOTE — Patient Outreach (Addendum)
Call placed to Dr. Anastasia Pall office in reference to [facility] wife question regarding nebulizer machine that was recommended in hospital.  Chart reviewed, noted mention of IPPV (intermittent positive pressure ventilation) nebulizer possibly being used as outpatient.  Message left for physician to clarify if member should have nebulizer, if so request for order to be placed.  Will await call back.  Valente Alfie, BSN, [place] Management  Community Care Manager [phone]  Update at 230pm: Spoke with Ria Comment, CMA for Dr. [name], and they are placing order for special nebulizer.  She will contact member's wife to inform her.  Will follow up with member/wife next week in regards to machine and further involvement with [facility].

## 2015-07-10 ENCOUNTER — Ambulatory Visit (HOSPITAL_COMMUNITY): Payer: Medicare Other | Attending: Cardiovascular Disease

## 2015-07-10 ENCOUNTER — Telehealth: Payer: Self-pay | Admitting: Pulmonary Disease

## 2015-07-10 DIAGNOSIS — I2510 Atherosclerotic heart disease of native coronary artery without angina pectoris: Secondary | ICD-10-CM | POA: Insufficient documentation

## 2015-07-10 DIAGNOSIS — R0609 Other forms of dyspnea: Secondary | ICD-10-CM | POA: Insufficient documentation

## 2015-07-10 DIAGNOSIS — Z8673 Personal history of transient ischemic attack (TIA), and cerebral infarction without residual deficits: Secondary | ICD-10-CM | POA: Diagnosis not present

## 2015-07-10 DIAGNOSIS — R9439 Abnormal result of other cardiovascular function study: Secondary | ICD-10-CM | POA: Diagnosis not present

## 2015-07-10 DIAGNOSIS — J690 Pneumonitis due to inhalation of food and vomit: Secondary | ICD-10-CM

## 2015-07-10 DIAGNOSIS — I10 Essential (primary) hypertension: Secondary | ICD-10-CM | POA: Insufficient documentation

## 2015-07-10 MED ORDER — TECHNETIUM TC 99M TETROFOSMIN IV KIT
10.7000 | PACK | Freq: Once | INTRAVENOUS | Status: AC | PRN
  Administered 2015-07-10: 11 via INTRAVENOUS
  Filled 2015-07-10: qty 11

## 2015-07-10 NOTE — Telephone Encounter (Signed)
OK We need to get him a therapy vest from [facility].  I will need to update my office note when I see him next.  When does he come back next?

## 2015-07-10 NOTE — Telephone Encounter (Signed)
Placed order for the therapy vest through [facility].  Next ov is with BQ on 08/02/15 @ 3:45

## 2015-07-10 NOTE — Telephone Encounter (Signed)
Malik Zuniga states that Dr. [name] ordered at IPPV (Intermittent Positive Pressure Nebulizer) for patient, she said that she has contacted several DME companies and they have all stated they do not carry this device.  They state that this device is no longer made.   Dr. [name], please advise.

## 2015-07-11 LAB — MYASTHENIA GRAVIS PANEL 2
Acetylcholine Rec Binding: <0.3 nmol/L
Acetylcholine Rec Mod Ab: 25 % binding inhibition
Aceytlcholine Rec Bloc Ab: <15 % of inhibition (ref <15)

## 2015-07-12 ENCOUNTER — Other Ambulatory Visit: Payer: Self-pay | Admitting: *Deleted

## 2015-07-12 ENCOUNTER — Telehealth: Payer: Self-pay | Admitting: Neurology

## 2015-07-12 ENCOUNTER — Ambulatory Visit: Payer: Medicare Other | Attending: Adult Health | Admitting: Physical Therapy

## 2015-07-12 VITALS — HR 84

## 2015-07-12 DIAGNOSIS — R1312 Dysphagia, oropharyngeal phase: Secondary | ICD-10-CM | POA: Diagnosis not present

## 2015-07-12 DIAGNOSIS — J690 Pneumonitis due to inhalation of food and vomit: Secondary | ICD-10-CM

## 2015-07-12 DIAGNOSIS — R2681 Unsteadiness on feet: Secondary | ICD-10-CM | POA: Diagnosis not present

## 2015-07-12 DIAGNOSIS — M6281 Muscle weakness (generalized): Secondary | ICD-10-CM | POA: Diagnosis not present

## 2015-07-12 DIAGNOSIS — R293 Abnormal posture: Secondary | ICD-10-CM | POA: Diagnosis not present

## 2015-07-12 DIAGNOSIS — R2689 Other abnormalities of gait and mobility: Secondary | ICD-10-CM | POA: Diagnosis not present

## 2015-07-12 DIAGNOSIS — R498 Other voice and resonance disorders: Secondary | ICD-10-CM | POA: Diagnosis not present

## 2015-07-12 DIAGNOSIS — J441 Chronic obstructive pulmonary disease with (acute) exacerbation: Secondary | ICD-10-CM

## 2015-07-12 NOTE — Telephone Encounter (Signed)
-----   Message from [place], DO sent at 07/12/2015  7:55 AM EDT ----- Let pt know that labs okay

## 2015-07-12 NOTE — Patient Instructions (Signed)
Malik Zuniga, Today we talked about getting a ramp built at your house. Try contacting your church to see if there is a men's group that builds ramps. Sometimes community rotary clubs will also build ramps.  Axial Extension (Chin Tuck)    While seated in your wheelchair, try to lift up your head and pull chin in to lengthen back of neck. Try to hold for __15-20__ seconds while counting out loud. Repeat __5-6 _ times per day.  Stabilization: Diaphragmatic Breathing - Sitting    Sit, feet flat (may sit in wheelchair with feet on foot rests) with one hand on stomach, other on chest. Breathe deeply through nose, lifting belly hand without motion of hand on chest. Repeat __20__ times per set. Do __2-3_ sets per day.

## 2015-07-12 NOTE — Telephone Encounter (Signed)
Mychart message sent to patient.

## 2015-07-12 NOTE — Therapy (Signed)
[place] [address] [place], [place], [address] Phone: [phone]   Fax:  [phone]  Physical Therapy Re-evaluation Treatment  Patient Details  Name: ROSTON GRUNEWALD MRN: [id number] Date of Birth: 1945-10-19 Referring Provider: Simonne Maffucci, MD  Encounter Date: 07/12/2015      PT End of Session - 07/12/15 1221    Visit Number 12   Number of Visits 20  Requesting 8 additional sessions   Date for PT Re-Evaluation 09/10/15   Authorization Type Medicare primary; BCBS secondary - G Codes and PN required every 10 visits   PT Start Time 0801   PT Stop Time 0848   PT Time Calculation (min) 47 min   Equipment Utilized During Treatment Gait belt   Activity Tolerance Patient limited by fatigue;Other (comment)  Limited by decreased SpO2   Behavior During Therapy [facility] for tasks assessed/performed      Past Medical History  Diagnosis Date  . Hyperlipidemia     takes Simvastatin daily  . Tremor   . History of prostate cancer   . On home O2   . Neuromuscular disorder ([place]) 1998    right carpal tunnel release  . Anemia associated with acute blood loss   . GERD (gastroesophageal reflux disease)     takes Omeprazole daily  . Hypertension     takes Metoprolol daily as well as Hyzaar  . Constipation     takes Colace daily as well as Miralax  . Anxiety     takes Valium daily  . Depression     takes Cymbalta daily  . Emphysema of lung (HCC)     Albuterol as needed;Symbicort daily and Singulair at bedtime  . Emphysema   . Myocardial infarction ([place]) 04/1998  . Coronary artery disease   . Asthma   . Shortness of breath     with exertion  . History of bronchitis   . Aspiration pneumonia ([place]) 2010  . Headache, chronic daily   . History of migraine     last migraine a couple of days ago;takes Excedrin Migraine  . Chronic back pain     compression fracture  . History of colon polyps   . Mood change ([place])    after Brain surgery mood changed and was placed on Depakote  . Insomnia     takes Benadryl nightly  . 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 Prostate cancer ([place]) 2004  . Lung cancer [facility])     Past Surgical History  Procedure Laterality Date  . Cholecystectomy  1996  . 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 Annette Stable  . Vascular stent  2000    Dr. Rollene Fare; he reports cardiac stent, but denies stent for PAD 06/15/13  . Hand surgery  1989    crushed thumb, Dr. Fredna Dow  . Ulnar nerve repair  1998    left arm, Dr. Fredna Dow  . Gastrostomy w/ feeding tube  2008    Dr. Watt Climes; only had for a few months  . Prostatectomy  04/2001    removal of prostate cancer, Dr. Janice Norrie  . Coronary angioplasty with stent placement  04/1998  . Carpal tunnel release  1998    right  . Spine surgery    . 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 Back surgery  08/2004; 02/2005; 04/2006; 06/2007; 7/20101    all by Dr. Annette Stable  . Esophagogastroduodenoscopy N/A 07/23/2012    Procedure: ESOPHAGOGASTRODUODENOSCOPY (  EGD);  Surgeon: Petra Kuba, MD;  Location: [facility] ENDOSCOPY;  Service: Endoscopy;  Laterality: N/A;  buccini /ja  . Colonoscopy    . Back surgery  05/2013  . Dg biopsy lung      Filed Vitals:   07/12/15 0804 07/12/15 0816  Pulse: 93 84  SpO2: 94% 82%        Subjective Assessment - 07/12/15 0804    Subjective "I'm feeling much better than I did before I went to the hospital..Marland KitchenI thought I was going to die there for a few days." Pt hospitalized from 5/15 - 06/29/15 for aspiration PNA at [facility] hospital. Noted MD order to return to therapy.  Pt has fallen once since being DC'ed home from hospital.    Pertinent History * Monitor SpO2 closely *   PMH: PNA, lung CA (diagnosed 11/2014; currently no radiation or chemo), COPD, CVA (1998),  aneurysm clipping (1999), kyphoscoliosis, thoracolumbar fusion x8 levels, HTN, HLD, prostate cancer (2004), MI (2000), depression, anxiety, bipolar disorder   Patient Stated Goals "I'd like to  keep working on the stairs, so I feel more in control. I'd like to keep working on my endurance, and to be able to push my wheelchair further without getting so tired."   Currently in Pain? Yes   Pain Score 3    Pain Location Back   Pain Orientation Lower   Pain Descriptors / Indicators Aching   Pain Type Chronic pain   Pain Onset More than a month ago   Pain Frequency Constant   Aggravating Factors  standing and walking   Pain Relieving Factors pain medication   Effect of Pain on Daily Activities limits standing and walking tolerance   Multiple Pain Sites No            [facility] PT Assessment - 07/12/15 0001    Assessment   Medical Diagnosis COPD mixed type   Referring Provider Max Fickle, MD   Onset Date/Surgical Date 11/11/14   Precautions   Precautions Fall   Restrictions   Weight Bearing Restrictions No   Prior Function   Level of Independence Requires assistive device for independence;Needs assistance with ADLs;Needs assistance with homemaking;Needs assistance with gait                     [facility] Adult PT Treatment/Exercise - 07/12/15 0001    Ambulation/Gait   Ambulation/Gait Yes   Ambulation/Gait Assistance 4: Min guard   Ambulation/Gait Assistance Details Gait x26', x18', and x44' with RW, R AFO. Seated rest breaks between trials. All gait trials were limited by pt fatigue and decreased SpO2 (82% was lowest SpO2 reading). Cueing focused on upright posture (as pt able) and paced breathing during gait.   Ambulation Distance (Feet) 44 Feet  and x26' and x18'   Assistive device Rolling walker;Other (Comment);R Forearm Crutch;L Forearm Crutch  R AFO    Gait Pattern Decreased step length - left;Decreased stride length;Decreased stance time - right;Decreased hip/knee flexion - right;Decreased hip/knee flexion - left;Decreased weight shift to right;Right flexed knee in stance;Left flexed knee in stance;Trunk flexed;Step-through pattern  improved R step length,  foot clearance/placement with AFO   Ambulation Surface Level;Indoor   Stairs --  Pt politely declined stairs due to pt feeling unsafe   Posture/Postural Control   Posture/Postural Control Postural limitations   Postural Limitations Rounded Shoulders;Forward head;Increased thoracic kyphosis;Posterior pelvic tilt;Weight shift left   Posture Comments Significant cervicothoracic kyphosis,  cervical spine lateral flexion to L, thoracic spine lateral  flexion to L.  Seated   Self-Care   Self-Care Other Self-Care Comments   Other Self-Care Comments  Due to pt inability to attempt stair negotiation during this session today, recommended that pt attempt to have ramp built to enter home. Provided pt with resources to do so. Pt verbalized understanding.   Exercises   Exercises Other Exercises   Other Exercises  Seated thoracolumbar extension (as pt able with fixed deformities of spine due to multiple surgeries and longstanding kyphoscoliosis) 15-20 secs x4 reps. Progressed to thoracic spine extension with cervical spine retraction 15-sec holds x3 reps. Also performed static standing with BUE support at RW, which was limited to 45 seconds by decreased SpO2 (see vital signs for details).                PT Education - 07/12/15 1250    Education provided Yes   Education Details Findings, revised goals, and POC. Provided education on impact of posture on quality of breathing; cueing for diaphragmatic breathing; and initiated HEP for postural awareness and endurance of cervical/thoracic extensors.   Person(s) Educated Patient   Methods Explanation;Demonstration;Handout;Verbal cues   Comprehension Verbalized understanding;Returned demonstration          PT Short Term Goals - 06/02/15 1624    PT SHORT TERM GOAL #1   Title Pt will independently perform HEP to maximize functional gains made in PT.    (Target date: 06/04/15)   Baseline Met 4/10.   Status Achieved   PT SHORT TERM GOAL #2   Title  Orthotist will be present for PT session to assess/address if pt is appropriate candidate for R AFO to improve safety with short-distance household ambulation.   (Target date: 06/04/15)   Baseline Orthotist present for session on 4/10.   Patient scheduled to receive personal R AFO week of 4/24.   Status Achieved   PT SHORT TERM GOAL #3   Title Schedule consult for custom w/c to improve positioning, postural alignment, contracture prevention, preservation of skin integrity, and improved breath support.    (Target date: 06/04/15)   Baseline Custom wheelchair assessment occurred on 4/18. Pt scheduled to receive new ultra light custom wheelchair 6-10 weeks from assessment.     Status Achieved   PT SHORT TERM GOAL #4   Title Pt will perform sit > stand from w/c with min A using LRAD to indicate increased safety with functional transfers.    (Target date: 06/04/15)   Baseline Met 4/3.   Status Achieved   PT SHORT TERM GOAL #5   Title Pt will ambulate x20' with Min A using LRAD with SpO2 > 89% to indicate improved activity tolerance.    (Target date: 06/04/15)   Baseline Met 4/3.   Status Achieved   PT SHORT TERM GOAL #6   Title Pt will negotiate 6 stairs with single R rail to with Min A with SpO2 > 89% to indicate increased pt safety using primary home entrance.  (Target date: 06/04/15)   Baseline Met 4/20.   Status Achieved           PT Long Term Goals - 07/12/15 6039    PT LONG TERM GOAL #1   Title Pt will perform sit > stand from w/c with mod I using LRAD to indicate increased safety with functional transfers.    (Modified target date: 08/09/15)   Baseline --  Continue goal through renewed POC.   Status On-going   PT LONG TERM GOAL #2   Title Pt  will ambulate 61' with Mod I using LRAD with SpO2 > 89% to indicate increased safety with household ambulation.      (Modified target date: 08/09/15)   Baseline August 03, 2022: REVISED from 150' to 63' due to significant functional decline during most previous  hospitalization.  Continue goal through renewed POC.   Status Revised   PT LONG TERM GOAL #3   Title Pt will negotiate 6 stairs with single R rail to with supervision with SpO2 > 89% to indicate increased pt safety using primary home entrance.    (Modified target date: 08/09/15)   Baseline 2022/08/03: REVISED from mod I to supervision due to significant functional decline during most previous hospitalization.  Continue goal through renewed POC.   Status Revised   PT LONG TERM GOAL #4   Title Pt will perform self-propulsion of w/c > 150' over level, indoor surfaces with mod I and SpO2 > 89% to indicate increased independence with w/c mobility.   (Modified target date: 08/09/15)   Baseline --  Continue goal through renewed POC.   Status On-going               Plan - 08-03-15 1252    Clinical Impression Statement Pt presented to today's session after hiatus from PT for 3 weeks after being hospitalized from 5/15 - 06/29/15 for aspiration PNA. Noted MD order to return to physical therapy. The following changes were noted since last PT session: gait distance significantly limited by fatigue and decreased SpO2; postural alignment more impaired (increased cervicothoracic kyphosis); decreased muscular endurance of cervical/thoracic spine extensors; more limited tolerance to standing (45 seconds maximum); impaired stability with all aspects of functional mobility (as pt required min guard for all standing/gait). Pt politely declined to attempt stair negotiation today due to fear of falling. Pt does not have an alternative entrance to home besides 6 stairs with single rail; therefore, PT provided pt with resources to obtain ramp to enter home. Pt will benefit from skilled outpatient PT 2x/week for up to 4 addditional weeks to address said impairments. Pt/wife in full agreement.    Rehab Potential Fair   Clinical Impairments Affecting Rehab Potential complex medical history; chronic pneumonia   PT Frequency 2x  / week   PT Duration 4 weeks   PT Treatment/Interventions ADLs/Self Care Home Management;DME Instruction;Stair training;Gait training;Neuromuscular re-education;Functional mobility training;Therapeutic activities;Therapeutic exercise;Balance training;Orthotic Fit/Training;Patient/family education;Wheelchair mobility training;Vestibular   PT Next Visit Plan Continue to progress standing and gait. Attempt stairs, as safe/appropriate. Monitor SpO2 closely.   Consulted and Agree with Plan of Care Patient;Family member/caregiver   Family Member Consulted wife, [name]      Patient will benefit from skilled therapeutic intervention in order to improve the following deficits and impairments:  Abnormal gait, Cardiopulmonary status limiting activity, Decreased activity tolerance, Decreased balance, Decreased endurance, Decreased skin integrity, Decreased strength, Difficulty walking, Impaired sensation, Postural dysfunction, Pain, Dizziness  Visit Diagnosis: Other abnormalities of gait and mobility - Plan: PT plan of care cert/re-cert  Unsteadiness on feet - Plan: PT plan of care cert/re-cert  Muscle weakness (generalized) - Plan: PT plan of care cert/re-cert  Posture abnormality - Plan: PT plan of care cert/re-cert       G-Codes - 2015-08-03 1304    Functional Assessment Tool Used Ambulates up to 44' prior to requiring seated rest break (due to SpO2 < 90%)   Functional Limitation Mobility: Walking and moving around   Mobility: Walking and Moving Around Current Status (H4765) At least 60 percent but less than  80 percent impaired, limited or restricted   Mobility: Walking and Moving Around Goal Status [phone]) At least 40 percent but less than 60 percent impaired, limited or restricted      Problem List Patient Active Problem List   Diagnosis Date Noted  . Aspiration pneumonia ([place]) 06/25/2015  . Tinea cruris 06/05/2015  . Sepsis ([place]) 04/01/2015  . CAP (community acquired pneumonia) 04/01/2015   . Cancer of lower lobe of right lung ([place]) 04/01/2015  . Chronic respiratory failure with hypoxia ([place]) 04/01/2015  . Cough 03/08/2015  . Dysphagia 02/01/2015  . Primary cancer of right lower lobe of lung ([place]) 12/05/2014  . Lung mass   . Pressure sore on elbow 05/10/2014  . Abscess of buttock, right 04/12/2014  . Recurrent aspiration pneumonia ([place]) 11/16/2013  . Toenail fungus 11/06/2013  . HCAP (healthcare-associated pneumonia) 10/23/2013  . Hypotension 10/16/2013  . 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 CAD (coronary artery disease) 07/27/2013  . Thoracic compression fracture ([place]) 06/21/2013  . Thoracic spine fracture ([place]) 06/21/2013  . Giant comedone 11/04/2012  . Nausea with vomiting 07/24/2012  . Unspecified constipation 07/24/2012  . Abdominal pain, acute, epigastric 07/22/2012  . Hyponatremia 07/22/2012  . Chronic lower back pain 07/22/2012  . Transaminitis 07/22/2012  . Prostate cancer ([place]) 04/22/2012  . Routine general medical examination at a health care facility 04/22/2012  . Pseudomonas infection 11/24/2011  . E coli bacteremia 11/24/2011  . Postoperative infection 11/24/2011  . Anemia associated with acute blood loss   . Bacteremia due to Pseudomonas 10/04/2011  . Sepsis(995.91) 10/02/2011  . 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 Falls frequently 07/31/2011  . COPD  GOLD III with reversibility  01/14/2011  . Allergic rhinitis, seasonal 12/18/2010  . Angioedema 10/31/2010  . Neoplasm of uncertain behavior of skin 05/07/2010  . MYOCARDIAL INFARCTION 02/20/2010  . C V A / STROKE 02/20/2010  . CANDIDIASIS 02/13/2010  . Hyperlipidemia 02/13/2010  . Bipolar disorder ([place]) 02/13/2010  . DEPRESSION 02/13/2010  . Essential hypertension 02/13/2010  . TREMOR 02/13/2010  . PROSTATE CANCER, HX OF 02/13/2010  . Hx of TIA (transient ischemic attack) and stroke 02/13/2010  . Chronic daily headache 02/13/2010    Billie Ruddy, PT, DPT [facility] [address] [place], [place], [address] Phone: [phone]   Fax:  [phone] 07/12/2015, 1:07 PM   Name: ZAHI PLASKETT MRN: [id number] Date of Birth: 21-Mar-1945

## 2015-07-12 NOTE — Patient Outreach (Signed)
Call placed to member/wife to follow up on request last week for special nebulizer machine.  Wife report that she was contacted by the pulmonology office and was informed that the specific nebulizer that was recommended is unavailable, but there was a search for an alternate.  She is made aware that according to the member's chart, a therapy vest has been ordered.  She is hesitant to accept it due to the cost, discussed importance of therapy and its relation to decreasing risk of recurrent pneumonia.  Encouraged to consider and speak with English representative about pricing.  She state that the member continues to be weak, but is improving.  She reports that he had physical therapy today, and has had it extended due to hospitalization.  She state he will also have speech therapy.  Discussed continuing with weekly transition of care calls, she again denies the need for such frequent follow ups, but want to remain involved with [facility].  Member was previously involved with health coach, will refer again for disease management.  Valente Merwin, BSN, [facility]  [facility] Care Manager [phone]

## 2015-07-13 ENCOUNTER — Ambulatory Visit (HOSPITAL_COMMUNITY): Payer: Medicare Other | Attending: Cardiology

## 2015-07-13 DIAGNOSIS — I10 Essential (primary) hypertension: Secondary | ICD-10-CM | POA: Diagnosis not present

## 2015-07-13 DIAGNOSIS — I2510 Atherosclerotic heart disease of native coronary artery without angina pectoris: Secondary | ICD-10-CM | POA: Diagnosis not present

## 2015-07-13 DIAGNOSIS — R9439 Abnormal result of other cardiovascular function study: Secondary | ICD-10-CM | POA: Diagnosis not present

## 2015-07-13 DIAGNOSIS — Z8673 Personal history of transient ischemic attack (TIA), and cerebral infarction without residual deficits: Secondary | ICD-10-CM | POA: Diagnosis not present

## 2015-07-13 DIAGNOSIS — R0609 Other forms of dyspnea: Secondary | ICD-10-CM | POA: Diagnosis not present

## 2015-07-13 LAB — MYOCARDIAL PERFUSION IMAGING
CHL CUP RESTING HR STRESS: 87 {beats}/min
CSEPPHR: 100 {beats}/min
LHR: 0.37
LV dias vol: 92 mL (ref 62–150)
LVSYSVOL: 48 mL
SDS: 6
SRS: 10
SSS: 13
TID: 1.31

## 2015-07-13 MED ORDER — REGADENOSON 0.4 MG/5ML IV SOLN
0.4000 mg | Freq: Once | INTRAVENOUS | Status: AC
  Administered 2015-07-13: 0.4 mg via INTRAVENOUS

## 2015-07-13 MED ORDER — TECHNETIUM TC 99M TETROFOSMIN IV KIT
31.6000 | PACK | Freq: Once | INTRAVENOUS | Status: AC | PRN
  Administered 2015-07-13: 32 via INTRAVENOUS
  Filled 2015-07-13: qty 32

## 2015-07-16 ENCOUNTER — Ambulatory Visit: Payer: Medicare Other | Admitting: Physical Therapy

## 2015-07-16 ENCOUNTER — Ambulatory Visit (INDEPENDENT_AMBULATORY_CARE_PROVIDER_SITE_OTHER): Payer: Medicare Other | Admitting: Internal Medicine

## 2015-07-16 ENCOUNTER — Ambulatory Visit (INDEPENDENT_AMBULATORY_CARE_PROVIDER_SITE_OTHER)
Admission: RE | Admit: 2015-07-16 | Discharge: 2015-07-16 | Disposition: A | Discharge status: Home / Self Care | Payer: Medicare Other | Source: Ambulatory Visit | Attending: Internal Medicine | Admitting: Internal Medicine

## 2015-07-16 ENCOUNTER — Encounter: Payer: Self-pay | Admitting: Internal Medicine

## 2015-07-16 VITALS — BP 126/80 | HR 93 | Temp 97.7°F | Resp 16 | Ht 70.0 in

## 2015-07-16 DIAGNOSIS — R0789 Other chest pain: Secondary | ICD-10-CM | POA: Insufficient documentation

## 2015-07-16 DIAGNOSIS — I2584 Coronary atherosclerosis due to calcified coronary lesion: Secondary | ICD-10-CM | POA: Diagnosis not present

## 2015-07-16 DIAGNOSIS — I2510 Atherosclerotic heart disease of native coronary artery without angina pectoris: Secondary | ICD-10-CM | POA: Diagnosis not present

## 2015-07-16 DIAGNOSIS — S299XXA Unspecified injury of thorax, initial encounter: Secondary | ICD-10-CM | POA: Diagnosis not present

## 2015-07-16 DIAGNOSIS — R0781 Pleurodynia: Secondary | ICD-10-CM | POA: Diagnosis not present

## 2015-07-16 DIAGNOSIS — R2689 Other abnormalities of gait and mobility: Secondary | ICD-10-CM

## 2015-07-16 NOTE — Progress Notes (Signed)
Subjective:  Patient ID: OSA CAMPOLI, male    DOB: August 27, 1945  Age: 70 y.o. MRN: [id number]  CC: Chest Pain   HPI BAIRD POLINSKI presents for evaluation of right lower, lateral rib cage pain 4 days after a fall. He was using a walker and says his right knee buckled causing him to sustain a ground-level fall. He since has had some soreness in the area but has not noticed any bruising or swelling. He denies coughing, shortness of breath, hemoptysis, abdominal pain, nausea, vomiting, loss of appetite, fever, chills, or hematuria. His usual medications for pain relief  Outpatient Prescriptions Prior to Visit  Medication Sig Dispense Refill  . ARIPiprazole (ABILIFY) 10 MG tablet Take 5 mg by mouth daily.     . Ascorbic Acid (VITAMIN C) 1000 MG tablet Take 1,000 mg by mouth daily.    Marland Kitchen aspirin-acetaminophen-caffeine (EXCEDRIN MIGRAINE) 250-250-65 MG per tablet Take 2 tablets by mouth every 6 (six) hours as needed. For headaches    . budesonide (PULMICORT) 0.25 MG/2ML nebulizer solution Take 4 mLs (0.5 mg total) by nebulization 2 (two) times daily. 60 mL 3  . cetirizine (ZYRTEC) 10 MG tablet TAKE 1 TABLET BY MOUTH DAILY. 30 tablet 10  . cholecalciferol (VITAMIN D) 1000 UNITS tablet Take 1,000 Units by mouth every morning.     . diazepam (VALIUM) 5 MG tablet Take 1 tablet (5 mg total) by mouth every 8 (eight) hours as needed for anxiety or muscle spasms. scheduled 30 tablet 0  . divalproex (DEPAKOTE) 500 MG 24 hr tablet Take 1,000 mg by mouth at bedtime.     Marland Kitchen DM-Doxylamine-Acetaminophen (NYQUIL COLD & FLU PO) Take 2 capsules by mouth at bedtime as needed (sleep).    . docusate sodium (COLACE) 100 MG capsule Take 200 mg by mouth at bedtime.     . DULoxetine (CYMBALTA) 60 MG capsule Take 120 mg by mouth daily.     . fluticasone (FLONASE) 50 MCG/ACT nasal spray PLACE 2 SPRAYS INTO BOTH NOSTRILS DAILY. (Patient taking differently: PLACE 2 SPRAYS INTO BOTH NOSTRILS DAILY as needed for allergies) 16 g  2  . HYDROcodone-acetaminophen (NORCO) 10-325 MG per tablet Take 1 tablet by mouth every 6 (six) hours as needed for moderate pain. for pain  0  . ipratropium-albuterol (DUONEB) 0.5-2.5 (3) MG/3ML SOLN Take 3 mLs by nebulization every 6 (six) hours. 360 mL 3  . losartan (COZAAR) 50 MG tablet Take 1 tablet (50 mg total) by mouth daily. 30 tablet 6  . metoprolol succinate (TOPROL-XL) 50 MG 24 hr tablet TAKE 1 TABLET (50 MG TOTAL) BY MOUTH EVERY MORNING. TAKE WITH OR IMMEDIATELY FOLLOWING A MEAL. 90 tablet 1  . montelukast (SINGULAIR) 10 MG tablet Take 1 tablet (10 mg total) by mouth at bedtime. 90 tablet 1  . NITROSTAT 0.4 MG SL tablet PLACE 1 TABLET (0.4 MG TOTAL) UNDER THE TONGUE EVERY 5 (FIVE) MINUTES AS NEEDED FOR CHEST PAIN. 25 tablet 1  . omeprazole (PRILOSEC) 40 MG capsule Take 40 mg by mouth 2 (two) times daily.     . polyethylene glycol (MIRALAX / GLYCOLAX) packet Take 17 g by mouth 3 (three) times daily as needed for moderate constipation. For constipation    . primidone (MYSOLINE) 250 MG tablet TAKE 1 TABLET (250 MG TOTAL) BY MOUTH 2 (TWO) TIMES DAILY. 180 tablet 1  . promethazine (PHENERGAN) 25 MG tablet Take 25 mg by mouth every 6 (six) hours as needed for nausea or vomiting. Reported on 04/06/2015  1  . ranitidine (ZANTAC) 150 MG tablet Take 150-300 mg by mouth 2 (two) times daily as needed for heartburn.    Marland Kitchen Respiratory Therapy Supplies (FLUTTER) DEVI Use as directed. 1 each 0  . simvastatin (ZOCOR) 40 MG tablet TAKE 1 TABLET (40 MG TOTAL) BY MOUTH EVERY EVENING. 90 tablet 3  . tiZANidine (ZANAFLEX) 4 MG tablet TAKE 1 TABLET BY MOUTH EVERY 8 HOURS AS NEEDED FOR MUSCLE SPASMS. 90 tablet 1  . Zinc 50 MG TABS Take 50 mg by mouth daily.     . clotrimazole-betamethasone (LOTRISONE) cream Apply 1 application topically 2 (two) times daily. 45 g 1  . predniSONE (DELTASONE) 20 MG tablet 2 tabs daily for 4 days, then 1 1/2 tab daily for 4 days, then 1 tab daily for 4 days, then 1/2 tab daily for  4 days and stop 20 tablet 0   No facility-administered medications prior to visit.    ROS Review of Systems  Constitutional: Negative.  Negative for fever and chills.  HENT: Negative.  Negative for trouble swallowing.   Eyes: Negative.   Respiratory: Negative.  Negative for cough, choking, chest tightness, shortness of breath, wheezing and stridor.   Cardiovascular: Negative.  Negative for chest pain, palpitations and leg swelling.  Gastrointestinal: Negative.  Negative for nausea, vomiting, abdominal pain, diarrhea and constipation.  Endocrine: Negative.   Genitourinary: Positive for flank pain. Negative for dysuria, urgency, frequency, hematuria, decreased urine volume and difficulty urinating.  Musculoskeletal: Positive for back pain. Negative for myalgias, joint swelling and arthralgias.  Skin: Negative.  Negative for color change and rash.  Allergic/Immunologic: Negative.   Neurological: Negative.  Negative for weakness and light-headedness.  Hematological: Negative.  Negative for adenopathy. Does not bruise/bleed easily.  Psychiatric/Behavioral: Positive for sleep disturbance. The patient is nervous/anxious.     Objective:  BP 126/80 mmHg  Pulse 93  Temp(Src) 97.7 F (36.5 C) (Oral)  Resp 16  Ht '5\' 10"'$  (1.778 m)  Wt   SpO2 93%  BP Readings from Last 3 Encounters:  07/16/15 126/80  07/03/15 106/66  07/02/15 130/70    Wt Readings from Last 3 Encounters:  07/10/15 164 lb (74.39 kg)  07/02/15 164 lb (74.39 kg)  06/29/15 163 lb 6.4 oz (74.118 kg)    Physical Exam  Constitutional: He is oriented to person, place, and time.  Non-toxic appearance. He has a sickly appearance. He appears ill. No distress.  Wheelchair-bound but no acute distress  HENT:  Mouth/Throat: Oropharynx is clear and moist. No oropharyngeal exudate.  Eyes: Conjunctivae are normal. Right eye exhibits no discharge. Left eye exhibits no discharge. No scleral icterus.  Neck: Normal range of motion.  Neck supple. No JVD present. No tracheal deviation present. No thyromegaly present.  Cardiovascular: Normal rate, regular rhythm, normal heart sounds and intact distal pulses.  Exam reveals no gallop and no friction rub.   No murmur heard. Pulmonary/Chest: Effort normal and breath sounds normal. No stridor. No respiratory distress. He has no wheezes. He has no rales. He exhibits no tenderness.    Abdominal: Soft. Bowel sounds are normal. He exhibits no distension and no mass. There is no tenderness. There is no rebound and no guarding.  Musculoskeletal: Normal range of motion. He exhibits no edema or tenderness.  Lymphadenopathy:    He has no cervical adenopathy.  Neurological: He is oriented to person, place, and time.  Skin: Skin is warm and dry. No rash noted. He is not diaphoretic. No erythema. No pallor.  Vitals reviewed.   Lab Results  Component Value Date   WBC 6.7 06/29/2015   HGB 11.7* 06/29/2015   HCT 34.5* 06/29/2015   PLT 285 06/29/2015   GLUCOSE 106* 06/29/2015   CHOL 249* 06/05/2015   TRIG 54.0 06/05/2015   HDL 114.60 06/05/2015   LDLDIRECT 112.3 04/22/2012   LDLCALC 124* 06/05/2015   ALT 11* 06/25/2015   AST 16 06/25/2015   NA 132* 06/29/2015   K 5.5* 06/29/2015   CL 94* 06/29/2015   CREATININE 0.67 06/29/2015   BUN 14 06/29/2015   CO2 30 06/29/2015   TSH 0.57 06/05/2015   PSA 0.01* 08/23/2013   INR 1.17 04/01/2015   HGBA1C 5.6 04/01/2015    Dg Op Swallowing Func-medicare/speech Path  07/05/2015  Objective Swallowing Evaluation: Type of Study: MBS-Modified Barium Swallow Study Patient Details Name: GERMANY CHELF MRN: [id number] Date of Birth: [birth year] Today's Date: 07/05/2015 Time: SLP Start Time (ACUTE ONLY): 1145-SLP Stop Time (ACUTE ONLY): 1205 SLP Time Calculation (min) (ACUTE ONLY): 20 min Past Medical History: Past Medical History Diagnosis Date . Hyperlipidemia    takes Simvastatin daily . Tremor  . History of prostate cancer  . On home O2  .  Neuromuscular disorder ([place]) 1998   right carpal tunnel release . Anemia associated with acute blood loss  . GERD (gastroesophageal reflux disease)    takes Omeprazole daily . Hypertension    takes Metoprolol daily as well as Hyzaar . Constipation    takes Colace daily as well as Miralax . Anxiety    takes Valium daily . Depression    takes Cymbalta daily . Emphysema of lung ([facility])    Albuterol as needed;Symbicort daily and Singulair at bedtime . Emphysema  . Myocardial infarction ([place]) 04/1998 . Coronary artery disease  . Asthma  . Shortness of breath    with exertion . History of bronchitis  . Aspiration pneumonia ([place]) 2010 . Headache, chronic daily  . History of migraine    last migraine a couple of days ago;takes Excedrin Migraine . Chronic back pain    compression fracture . History of colon polyps  . Mood change ([place])    after Brain surgery mood changed and was placed on Depakote . Insomnia    takes Benadryl nightly . 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 Prostate cancer ([place]) 2004 . Lung cancer [facility])  Past Surgical History: Past Surgical History Procedure Laterality Date . Cholecystectomy  1996 . Craniotomy  1999   to clip aneurism, Dr. Annette Stable . Vascular stent  2000   Dr. Rollene Fare; he reports cardiac stent, but denies stent for PAD 06/15/13 . Hand surgery  1989   crushed thumb, Dr. Fredna Dow . Ulnar nerve repair  1998   left arm, Dr. Fredna Dow . Gastrostomy w/ feeding tube  2008   Dr. Watt Climes; only had for a few months . Prostatectomy  04/2001   removal of prostate cancer, Dr. Janice Norrie . Coronary angioplasty with stent placement  04/1998 . Carpal tunnel release  1998   right . Spine surgery   . Brain surgery  1999   clip aneurysm . Back surgery  08/2004; 02/2005; 04/2006; 06/2007; 7/20101   all by Dr. Annette Stable . Esophagogastroduodenoscopy N/A 07/23/2012   Procedure: ESOPHAGOGASTRODUODENOSCOPY (EGD);  Surgeon: Jeryl Columbia, MD;  Location: [facility] ENDOSCOPY;  Service: Endoscopy;  Laterality: N/A;  buccini /ja . Colonoscopy   . Back surgery   05/2013 . Dg biopsy lung   HPI: 70 y.o. male referred for OPMBS due  to ongoing ongoing complaint of dysphagia. Pt reports bothersome coughing/chokined particulary with shredded/ground meats. He reprots coughign with liquids, but his wife feels he does not cough with liquids frequently. He believes his strength and function have declined over the past two years, since last MBS which recommended Regualr soldis and thin liquids an exercise regime and f/u MBS to assess progress. Pt did not have f/u MBS until today.  He reprots he is considering puree foods and would be open to anything to prevent coughing episodes. He was recently admitted to the hosptial for pna; CXRs intermittently show persistent right base subsegmental atelectasis and or infiltrate, but pt also has a history of right LL adenocarcinoma and radiation tx.  PCCM note states recurrent aspriation pna due to narcotic use and chronic immunosuppression from steroids. Marland Kitchen  Hx includes COPD; PEG 2008; craniotomy with aneurysm clipping 1999; [place]; GERD; multiple back surgeries.  No Data Recorded Assessment / Plan / Recommendation CHL IP CLINICAL IMPRESSIONS 07/05/2015 Therapy Diagnosis Moderate oral phase dysphagia;Moderate pharyngeal phase dysphagia Clinical Impression Pt demonstrates persistent oral and oropharyngeal dysphagia with poor trunk support for intake, early fatigue, sensory deficits and generalized weakness. Oral phase is characterized by poor bolus cohesion and early pumping resulting is significant premature spillage, particularly with solids reaching past the valleculae. Base of tongue and ororpharyngeal strength is reduced with moderate vallecular and pyriform residuals post swallow, particularly due to horizontal position of pharynx.. Function not particularly different between nectar and thin; no signifiicant penetration or aspiration occurred. However, given multiple risk facotrs intemrittent aspiration expected. Recommend pt f/u with OP  SLP as planned and consider diet modification to puree and nectar per pts wishes. He could benefit from training to take very small solids bites, masticate and hold bolus prior to transit (was not stimulable in MBS). Pt noted to take large bolsues. Following bites with sips and very beneficial. Discussed with pt and wife. Defer treatment plan to OP SLP.  Impact on safety and function Severe aspiration risk   CHL IP TREATMENT RECOMMENDATION 07/05/2015 Treatment Recommendations Therapy as outlined in treatment plan below   No flowsheet data found. CHL IP DIET RECOMMENDATION 07/05/2015 SLP Diet Recommendations Dysphagia 1 (Puree) solids;Thin liquid;Regular solids;Nectar thick liquid Liquid Administration via Cup;Straw Medication Administration Whole meds with puree Compensations Slow rate;Small sips/bites;Follow solids with liquid;Minimize environmental distractions Postural Changes Remain semi-upright after after feeds/meals (Comment)   No flowsheet data found.  CHL IP FOLLOW UP RECOMMENDATIONS 07/05/2015 Follow up Recommendations Outpatient SLP   CHL IP FREQUENCY AND DURATION 02/10/2011 Speech Therapy Frequency (ACUTE ONLY) min 1 x/week Treatment Duration 1 week      CHL IP ORAL PHASE 07/05/2015 Oral Phase Impaired Oral - Pudding Teaspoon -- Oral - Pudding Cup -- Oral - Honey Teaspoon -- Oral - Honey Cup -- Oral - Nectar Teaspoon -- Oral - Nectar Cup -- Oral - Nectar Straw Delayed oral transit;Decreased bolus cohesion;Premature spillage Oral - Thin Teaspoon -- Oral - Thin Cup Delayed oral transit;Decreased bolus cohesion;Premature spillage Oral - Thin Straw Delayed oral transit;Decreased bolus cohesion;Premature spillage Oral - Puree Delayed oral transit;Decreased bolus cohesion;Premature spillage Oral - Mech Soft Delayed oral transit;Decreased bolus cohesion;Premature spillage Oral - Regular -- Oral - Multi-Consistency -- Oral - Pill -- Oral Phase - Comment --  CHL IP PHARYNGEAL PHASE 07/05/2015 Pharyngeal Phase Impaired  Pharyngeal- Pudding Teaspoon -- Pharyngeal -- Pharyngeal- Pudding Cup -- Pharyngeal -- Pharyngeal- Honey Teaspoon -- Pharyngeal -- Pharyngeal- Honey Cup -- Pharyngeal -- Pharyngeal- Nectar Teaspoon -- Pharyngeal --  Pharyngeal- Nectar Cup -- Pharyngeal -- Pharyngeal- Nectar Straw Delayed swallow initiation-pyriform sinuses;Reduced pharyngeal peristalsis;Reduced tongue base retraction;Pharyngeal residue - valleculae;Pharyngeal residue - pyriform Pharyngeal -- Pharyngeal- Thin Teaspoon -- Pharyngeal -- Pharyngeal- Thin Cup Delayed swallow initiation-pyriform sinuses;Reduced pharyngeal peristalsis;Reduced tongue base retraction;Pharyngeal residue - valleculae;Pharyngeal residue - pyriform Pharyngeal -- Pharyngeal- Thin Straw Delayed swallow initiation-pyriform sinuses;Reduced pharyngeal peristalsis;Reduced tongue base retraction;Pharyngeal residue - valleculae;Pharyngeal residue - pyriform;Penetration/Aspiration during swallow Pharyngeal -- Pharyngeal- Puree Delayed swallow initiation-pyriform sinuses;Reduced pharyngeal peristalsis;Reduced tongue base retraction;Pharyngeal residue - valleculae;Pharyngeal residue - pyriform Pharyngeal -- Pharyngeal- Mechanical Soft Delayed swallow initiation-pyriform sinuses;Reduced pharyngeal peristalsis;Reduced tongue base retraction;Pharyngeal residue - valleculae;Pharyngeal residue - pyriform Pharyngeal -- Pharyngeal- Regular -- Pharyngeal -- Pharyngeal- Multi-consistency -- Pharyngeal -- Pharyngeal- Pill Delayed swallow initiation-pyriform sinuses;Reduced pharyngeal peristalsis;Reduced tongue base retraction;Pharyngeal residue - valleculae;Pharyngeal residue - pyriform Pharyngeal -- Pharyngeal Comment --  No flowsheet data found. CHL IP GO 07/05/2015 Functional Assessment Tool Used clinical judgement Functional Limitations Swallowing Swallow Current Status [phone]) CJ Swallow Goal Status (B0488) CJ Swallow Discharge Status (Q9169) CJ Motor Speech Current Status [phone]) (None) Motor  Speech Goal Status (U8280) (None) Motor Speech Goal Status (K3491) (None) Spoken Language Comprehension Current Status (P9150) (None) Spoken Language Comprehension Goal Status (V6979) (None) Spoken Language Comprehension Discharge Status [phone]) (None) Spoken Language Expression Current Status [phone]) (None) Spoken Language Expression Goal Status [phone]) (None) Spoken Language Expression Discharge Status [phone]) (None) Attention Current Status (G9201) (None) Attention Goal Status (E0712) (None) Attention Discharge Status [phone]) (None) Memory Current Status (I3254) (None) Memory Goal Status (D8264) (None) Memory Discharge Status (B5830) (None) Voice Current Status (N4076) (None) Voice Goal Status (K0881) (None) Voice Discharge Status (J0315) (None) Other Speech-Language Pathology Functional Limitation [phone]) (None) Other Speech-Language Pathology Functional Limitation Goal Status (F2924) (None) Other Speech-Language Pathology Functional Limitation Discharge Status [phone]) (None) DeBlois, Katherene Ponto 07/05/2015, 1:55 PM            CLINICAL DATA:  Choking on food. EXAM: MODIFIED BARIUM SWALLOW TECHNIQUE: Different consistencies of barium were administered orally to the patient by the Speech Pathologist. Imaging of the pharynx was performed in the lateral projection. FLUOROSCOPY TIME:  FLUOROSCOPY TIME Fluoroscopy Time:  3 minutes 45 seconds. COMPARISON:  None. FINDINGS: Thin liquid- Penetration without evidence of aspiration. Nectar thick liquid- penetration without evidence of aspiration. Cracker-penetration without evidence of aspiration. IMPRESSION: Evidence of laryngeal penetration without clear evidence of tracheal aspiration. Please refer to the Speech Pathologists report for complete details and recommendations. Electronically Signed   By: Fidela Salisbury M.D.   On: 07/05/2015 12:52    Assessment & Plan:   Napoleon was seen today for chest pain.  Diagnoses and all orders for this  visit:  Right-sided chest wall pain- Exam is remarkable for some tenderness to palpation but a plain x-ray is negative for fracture/pulmonary contusion/pneumothorax/effusion. He will continue his current medications for pain relief. -     DG Ribs Unilateral Right; Future   I have discontinued Mr. Vanscyoc clotrimazole-betamethasone and predniSONE. I am also having him maintain his divalproex, omeprazole, Zinc, cholecalciferol, docusate sodium, aspirin-acetaminophen-caffeine, DULoxetine, polyethylene glycol, vitamin C, ipratropium-albuterol, budesonide, HYDROcodone-acetaminophen, ARIPiprazole, NITROSTAT, promethazine, fluticasone, tiZANidine, cetirizine, losartan, metoprolol succinate, montelukast, primidone, simvastatin, FLUTTER, DM-Doxylamine-Acetaminophen (NYQUIL COLD & FLU PO), ranitidine, and diazepam.  No orders of the defined types were placed in this encounter.     Follow-up: Return if symptoms worsen or fail to improve.  Scarlette Calico, MD

## 2015-07-16 NOTE — Patient Instructions (Signed)

## 2015-07-16 NOTE — Therapy (Signed)
[facility] [address] [place], [place], [address] Phone: [phone]   Fax:  [phone]  Physical Therapy Documentation  Patient Details  Name: Malik Zuniga MRN: [id number] Date of Birth: 18-Mar-1945 Referring Provider: Simonne Maffucci, MD  Encounter Date: 07/16/2015   No billable services provided today. PT session not initiated due to suspected injury sustained in recent fall. See below for additional details.    Past Medical History  Diagnosis Date  . Hyperlipidemia     takes Simvastatin daily  . Tremor   . History of prostate cancer   . On home O2   . Neuromuscular disorder ([place]) 1998    right carpal tunnel release  . Anemia associated with acute blood loss   . GERD (gastroesophageal reflux disease)     takes Omeprazole daily  . Hypertension     takes Metoprolol daily as well as Hyzaar  . Constipation     takes Colace daily as well as Miralax  . Anxiety     takes Valium daily  . Depression     takes Cymbalta daily  . Emphysema of lung ([facility])     Albuterol as needed;Symbicort daily and Singulair at bedtime  . Emphysema   . Myocardial infarction ([place]) 04/1998  . Coronary artery disease   . Asthma   . Shortness of breath     with exertion  . History of bronchitis   . Aspiration pneumonia ([place]) 2010  . Headache, chronic daily   . History of migraine     last migraine a couple of days ago;takes Excedrin Migraine  . Chronic back pain     compression fracture  . History of colon polyps   . Mood change ([place])     after Brain surgery mood changed and was placed on Depakote  . Insomnia     takes Benadryl nightly  . 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 Prostate cancer ([place]) 2004  . Lung cancer [facility])     Past Surgical History  Procedure Laterality Date  . Cholecystectomy  1996  . 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 Annette Stable  . Vascular stent  2000    Dr. Rollene Fare; he reports cardiac stent,  but denies stent for PAD 06/15/13  . Hand surgery  1989    crushed thumb, Dr. Fredna Dow  . Ulnar nerve repair  1998    left arm, Dr. Fredna Dow  . Gastrostomy w/ feeding tube  2008    Dr. Watt Climes; only had for a few months  . Prostatectomy  04/2001    removal of prostate cancer, Dr. Janice Norrie  . Coronary angioplasty with stent placement  04/1998  . Carpal tunnel release  1998    right  . Spine surgery    . 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 Back surgery  08/2004; 02/2005; 04/2006; 06/2007; 7/20101    all by Dr. Annette Stable  . Esophagogastroduodenoscopy N/A 07/23/2012    Procedure: ESOPHAGOGASTRODUODENOSCOPY (EGD);  Surgeon: Jeryl Columbia, MD;  Location: [facility] ENDOSCOPY;  Service: Endoscopy;  Laterality: N/A;  buccini /ja  . Colonoscopy    . Back surgery  05/2013  . Dg biopsy lung      There were no vitals filed for this visit.      Subjective Assessment - 07/16/15 0951    Subjective "I did something bad - I fell when I was walking to the bathroom in the middle of  the night." Upon further questioning, pt reports having fallen into rolling walker, colliding R ribcage into walker frame. Pt believes he may have fractured his ribs, stating, "The pain is just awful. It hurts to breathe; but I am willing to do therapy tooday." Pt was not wearing AFO at time of fall.    Patient is accompained by: Family member  wife, [name]   Pertinent History * Monitor SpO2 closely *   PMH: PNA, lung CA (diagnosed 11/2014; currently no radiation or chemo), COPD, CVA (1998),  aneurysm clipping (1999), kyphoscoliosis, thoracolumbar fusion x8 levels, HTN, HLD, prostate cancer (2004), MI (2000), depression, anxiety, bipolar disorder   Patient Stated Goals "I'd like to keep working on the stairs, so I feel more in control. I'd like to keep working on my endurance, and to be able to push my wheelchair further without getting so tired."   Pain Score 8    Pain Location Rib cage   Pain Orientation Right;Lower;Mid   Pain Descriptors / Indicators  Shooting   Pain Type Acute pain   Pain Onset In the past 7 days   Aggravating Factors  breathing, movement   Pain Relieving Factors staying still   Multiple Pain Sites No                                 PT Education - 07/16/15 0954    Education provided Yes   Education Details Recommending to hold PT until patient is evaluated by MD. Requested written MD order stating pt safe to return to PT and outlining any restrictions/precautions. Advised pt's wife to call MD immediately after this session and notiied pt/wife that PT will route this note to Dr. [name], who pt plans to see on Wednesday for face-to-face appt for wheelchair.    Person(s) Educated Patient   Methods Explanation   Comprehension Verbalized understanding          PT Short Term Goals - 06/02/15 1624    PT SHORT TERM GOAL #1   Title Pt will independently perform HEP to maximize functional gains made in PT.    (Target date: 06/04/15)   Baseline Met 4/10.   Status Achieved   PT SHORT TERM GOAL #2   Title Orthotist will be present for PT session to assess/address if pt is appropriate candidate for R AFO to improve safety with short-distance household ambulation.   (Target date: 06/04/15)   Baseline Orthotist present for session on 4/10.   Patient scheduled to receive personal R AFO week of 4/24.   Status Achieved   PT SHORT TERM GOAL #3   Title Schedule consult for custom w/c to improve positioning, postural alignment, contracture prevention, preservation of skin integrity, and improved breath support.    (Target date: 06/04/15)   Baseline Custom wheelchair assessment occurred on 4/18. Pt scheduled to receive new ultra light custom wheelchair 6-10 weeks from assessment.     Status Achieved   PT SHORT TERM GOAL #4   Title Pt will perform sit > stand from w/c with min A using LRAD to indicate increased safety with functional transfers.    (Target date: 06/04/15)   Baseline Met 4/3.   Status Achieved    PT SHORT TERM GOAL #5   Title Pt will ambulate x20' with Min A using LRAD with SpO2 > 89% to indicate improved activity tolerance.    (Target date: 06/04/15)   Baseline Met 4/3.  Status Achieved   PT SHORT TERM GOAL #6   Title Pt will negotiate 6 stairs with single R rail to with Min A with SpO2 > 89% to indicate increased pt safety using primary home entrance.  (Target date: 06/04/15)   Baseline Met 4/20.   Status Achieved           PT Long Term Goals - 07/12/15 9924    PT LONG TERM GOAL #1   Title Pt will perform sit > stand from w/c with mod I using LRAD to indicate increased safety with functional transfers.    (Modified target date: 08/09/15)   Baseline --  Continue goal through renewed POC.   Status On-going   PT LONG TERM GOAL #2   Title Pt will ambulate 56' with Mod I using LRAD with SpO2 > 89% to indicate increased safety with household ambulation.      (Modified target date: 08/09/15)   Baseline 6/1: REVISED from 150' to 50' due to significant functional decline during most previous hospitalization.  Continue goal through renewed POC.   Status Revised   PT LONG TERM GOAL #3   Title Pt will negotiate 6 stairs with single R rail to with supervision with SpO2 > 89% to indicate increased pt safety using primary home entrance.    (Modified target date: 08/09/15)   Baseline 6/1: REVISED from mod I to supervision due to significant functional decline during most previous hospitalization.  Continue goal through renewed POC.   Status Revised   PT LONG TERM GOAL #4   Title Pt will perform self-propulsion of w/c > 150' over level, indoor surfaces with mod I and SpO2 > 89% to indicate increased independence with w/c mobility.   (Modified target date: 08/09/15)   Baseline --  Continue goal through renewed POC.   Status On-going               Plan - 07/16/15 0956    Clinical Impression Statement Pt arrived to this session reporting that he may have fractured R rib(s) in fall  sustained over weekend. Pt has significant R ribcage pain which increases with respiration. This therapist is recommending to hold PT until patient is evaluated by MD. Requested written MD order stating pt safe to return to PT and outlining any restrictions/precautions. Advised pt's wife to call MD immediately after this session and notiied pt/wife that PT will route this note to Dr. [name], who pt plans to see on Wednesday for face-to-face appt for wheelchair.    Rehab Potential Fair   Clinical Impairments Affecting Rehab Potential complex medical history; chronic pneumonia   PT Frequency 2x / week   PT Duration 4 weeks   PT Treatment/Interventions ADLs/Self Care Home Management;DME Instruction;Stair training;Gait training;Neuromuscular re-education;Functional mobility training;Therapeutic activities;Therapeutic exercise;Balance training;Orthotic Fit/Training;Patient/family education;Wheelchair mobility training;Vestibular   PT Next Visit Plan Obtain MD order to return to PT with restrictions, id applicable. Continue to progress standing and gait. Attempt stairs, as safe/appropriate. Monitor SpO2 closely.   Consulted and Agree with Plan of Care Patient;Family member/caregiver   Family Member Consulted wife, [name]      Patient will benefit from skilled therapeutic intervention in order to improve the following deficits and impairments:  Abnormal gait, Cardiopulmonary status limiting activity, Decreased activity tolerance, Decreased balance, Decreased endurance, Decreased skin integrity, Decreased strength, Difficulty walking, Impaired sensation, Postural dysfunction, Pain, Dizziness  Visit Diagnosis: Other abnormalities of gait and mobility     Problem List Patient Active Problem List   Diagnosis Date  Noted  . Aspiration pneumonia ([place]) 06/25/2015  . Tinea cruris 06/05/2015  . Sepsis ([place]) 04/01/2015  . CAP (community acquired pneumonia) 04/01/2015  . Cancer of lower lobe of right lung  ([place]) 04/01/2015  . Chronic respiratory failure with hypoxia ([place]) 04/01/2015  . Cough 03/08/2015  . Dysphagia 02/01/2015  . Primary cancer of right lower lobe of lung ([place]) 12/05/2014  . Lung mass   . Pressure sore on elbow 05/10/2014  . Abscess of buttock, right 04/12/2014  . Recurrent aspiration pneumonia ([place]) 11/16/2013  . Toenail fungus 11/06/2013  . HCAP (healthcare-associated pneumonia) 10/23/2013  . Hypotension 10/16/2013  . 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 CAD (coronary artery disease) 07/27/2013  . Thoracic compression fracture ([place]) 06/21/2013  . Thoracic spine fracture ([place]) 06/21/2013  . Giant comedone 11/04/2012  . Nausea with vomiting 07/24/2012  . Unspecified constipation 07/24/2012  . Abdominal pain, acute, epigastric 07/22/2012  . Hyponatremia 07/22/2012  . Chronic lower back pain 07/22/2012  . Transaminitis 07/22/2012  . Prostate cancer ([place]) 04/22/2012  . Routine general medical examination at a health care facility 04/22/2012  . Pseudomonas infection 11/24/2011  . E coli bacteremia 11/24/2011  . Postoperative infection 11/24/2011  . Anemia associated with acute blood loss   . Bacteremia due to Pseudomonas 10/04/2011  . Sepsis(995.91) 10/02/2011  . 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 Falls frequently 07/31/2011  . COPD  GOLD III with reversibility  01/14/2011  . Allergic rhinitis, seasonal 12/18/2010  . Angioedema 10/31/2010  . Neoplasm of uncertain behavior of skin 05/07/2010  . MYOCARDIAL INFARCTION 02/20/2010  . C V A / STROKE 02/20/2010  . CANDIDIASIS 02/13/2010  . Hyperlipidemia 02/13/2010  . Bipolar disorder ([place]) 02/13/2010  . DEPRESSION 02/13/2010  . Essential hypertension 02/13/2010  . TREMOR 02/13/2010  . PROSTATE CANCER, HX OF 02/13/2010  . Hx of TIA (transient ischemic attack) and stroke 02/13/2010  . Chronic daily headache 02/13/2010   Billie Ruddy, PT, DPT [facility] [address] [place], [place], [address] Phone: [phone]   Fax:  [phone] 07/16/2015, 9:58 AM  Name: ANGELL HONSE MRN: [id number] Date of Birth: 18-Feb-1945

## 2015-07-17 ENCOUNTER — Encounter: Payer: Self-pay | Admitting: Internal Medicine

## 2015-07-19 ENCOUNTER — Telehealth: Payer: Self-pay | Admitting: Internal Medicine

## 2015-07-19 ENCOUNTER — Ambulatory Visit (INDEPENDENT_AMBULATORY_CARE_PROVIDER_SITE_OTHER): Payer: Medicare Other | Admitting: Family Medicine

## 2015-07-19 ENCOUNTER — Encounter: Payer: Self-pay | Admitting: Family Medicine

## 2015-07-19 VITALS — BP 110/72 | HR 110 | Temp 98.1°F | Resp 17 | Ht 70.0 in | Wt 167.5 lb

## 2015-07-19 DIAGNOSIS — Z8673 Personal history of transient ischemic attack (TIA), and cerebral infarction without residual deficits: Secondary | ICD-10-CM

## 2015-07-19 DIAGNOSIS — I2510 Atherosclerotic heart disease of native coronary artery without angina pectoris: Secondary | ICD-10-CM | POA: Diagnosis not present

## 2015-07-19 DIAGNOSIS — R296 Repeated falls: Secondary | ICD-10-CM | POA: Diagnosis not present

## 2015-07-19 DIAGNOSIS — I2584 Coronary atherosclerosis due to calcified coronary lesion: Secondary | ICD-10-CM | POA: Diagnosis not present

## 2015-07-19 NOTE — Assessment & Plan Note (Signed)
Ongoing issue for pt.  He is not able to ambulate w/ walker as he still falls.  Has had multiple falls due to LE weakness, R>L.  The only safe means of transport for pt is a manual wheelchair and he is in need of another due to age of current chair and state of disrepair.  Goal is to provide him w/ some ongoing independence but provide him the safety he needs.

## 2015-07-19 NOTE — Assessment & Plan Note (Addendum)
Chronic problem.  Pt has R sided residual deficits, making safe ambulation impossible as each attempt results in a fall.  Pt is now using a wheel chair for all transport at the recommendation of both myself and PT.  Forms completed today for new ultralight manual wheelchair.  Total time spent w/ pt- 25 minutes, >50% spent counseling on need for new chair, PT review, and discussion w/ family

## 2015-07-19 NOTE — Telephone Encounter (Signed)
Done, faxed to number on file.

## 2015-07-19 NOTE — Telephone Encounter (Signed)
Ok with me 

## 2015-07-19 NOTE — Telephone Encounter (Signed)
Please advise 

## 2015-07-19 NOTE — Progress Notes (Signed)
   Subjective:    Patient ID: Malik Zuniga, male    DOB: 20-Apr-1945, 70 y.o.   MRN: [id number]  HPI Wheelchair evaluation- pt is here today for evaluation for an ultra light manual wheelchair to replace his current manual chair which is starting to fall into disrepair after 14 yrs.  Pt requires an ultra light chair due to ease of self proprelling and ability for wife to fold/breakdown and transport chair.  Pt is in process of having ramp built so he can enter and exit home independently.  Pt has had recent PT evaluation which was reviewed w/ pt and wife today and I am in agreement w/ findings.  As noted in problem list, pt has hx of multiple pressure sores- at least 2 on bottom and 1 on elbow- requiring a skin protection cushion.  Pt is looking forward to new chair for the contoured back due to his ongoing, chronic low back pain which he rates as a 3/10 at best but can escalate to 5 or greater.  Pt's in home mobility will dramatically improve- allowing him to get and prepare meals w/o fear of falling.    Review of Systems For ROS see HPI     Objective:   Physical Exam  Constitutional: He is oriented to person, place, and time. He appears well-developed and well-nourished. No distress.  Sitting in wheelchair  HENT:  Head: Normocephalic and atraumatic.  Musculoskeletal: He exhibits no edema.  Neurological: He is alert and oriented to person, place, and time. No cranial nerve deficit. Coordination (weakness of legs bilaterally, R>L) abnormal.  Non-ambulatory  Skin: Skin is warm and dry.  Psychiatric: He has a normal mood and affect. His behavior is normal. Thought content normal.  Vitals reviewed.         Assessment & Plan:

## 2015-07-19 NOTE — Patient Instructions (Signed)
Follow up as scheduled Keep up the good work!  You look great! NO MORE FALLS!!! I can't wait to see your new chair!!!

## 2015-07-19 NOTE — Progress Notes (Signed)
Pre visit review using our clinic review tool, if applicable. No additional management support is needed unless otherwise documented below in the visit note. 

## 2015-07-19 NOTE — Telephone Encounter (Signed)
Patients spouse is requesting letter from Dr. Ronnald Ramp stating that it will be safe for patient to go back into neuro rehabilitation after discovering patient does not have any broken ribs.  Spouse can either pick up or can be faxed to facility at [phone]. To the Attention of Benjie Karvonen. Please follow back up in regard.

## 2015-07-20 ENCOUNTER — Ambulatory Visit: Payer: Medicare Other

## 2015-07-20 ENCOUNTER — Ambulatory Visit: Payer: Medicare Other | Admitting: Physical Therapy

## 2015-07-23 ENCOUNTER — Ambulatory Visit: Payer: Medicare Other | Admitting: Physical Therapy

## 2015-07-23 ENCOUNTER — Ambulatory Visit: Payer: Medicare Other

## 2015-07-23 VITALS — HR 114

## 2015-07-23 DIAGNOSIS — M6281 Muscle weakness (generalized): Secondary | ICD-10-CM | POA: Diagnosis not present

## 2015-07-23 DIAGNOSIS — R1312 Dysphagia, oropharyngeal phase: Secondary | ICD-10-CM

## 2015-07-23 DIAGNOSIS — R2681 Unsteadiness on feet: Secondary | ICD-10-CM | POA: Diagnosis not present

## 2015-07-23 DIAGNOSIS — R293 Abnormal posture: Secondary | ICD-10-CM | POA: Diagnosis not present

## 2015-07-23 DIAGNOSIS — R2689 Other abnormalities of gait and mobility: Secondary | ICD-10-CM

## 2015-07-23 DIAGNOSIS — R498 Other voice and resonance disorders: Secondary | ICD-10-CM | POA: Diagnosis not present

## 2015-07-23 NOTE — Patient Instructions (Addendum)
Walking Program:  Begin walking for exercise for 3 minutes, 1 time/day, 5-7 days/week.   Progress your walking program by adding 30 seconds to 1 minute to your routine each week, as tolerated. Be sure to wear good walking shoes,use your walker, walk in a safe environment (your home) and only progress to your tolerance.  Try to have [name] stand next to you while you're walking, if possible.    * If your oxygen decreases to less than 90%, stop and rest until your oxygen increases to at least 92%.

## 2015-07-23 NOTE — Therapy (Signed)
[facility] [address] [place], [place], [address] Phone: [phone]   Fax:  [phone]  Physical Therapy Treatment  Patient Details  Name: Malik Zuniga MRN: [id number] Date of Birth: 1945-06-26 Referring Provider: Simonne Maffucci, MD  Encounter Date: 07/23/2015      PT End of Session - 07/23/15 1307    Visit Number 13   Number of Visits 20   Date for PT Re-Evaluation 09/10/15   Authorization Type Medicare primary; BCBS secondary - G Codes and PN required every 10 visits   PT Start Time 0802   PT Stop Time 0845   PT Time Calculation (min) 43 min   Equipment Utilized During Treatment Gait belt   Activity Tolerance Patient limited by fatigue  Multiple seated rest breaks required   Behavior During Therapy [facility] for tasks assessed/performed      Past Medical History  Diagnosis Date  . Hyperlipidemia     takes Simvastatin daily  . Tremor   . History of prostate cancer   . On home O2   . Neuromuscular disorder ([place]) 1998    right carpal tunnel release  . Anemia associated with acute blood loss   . GERD (gastroesophageal reflux disease)     takes Omeprazole daily  . Hypertension     takes Metoprolol daily as well as Hyzaar  . Constipation     takes Colace daily as well as Miralax  . Anxiety     takes Valium daily  . Depression     takes Cymbalta daily  . Emphysema of lung (HCC)     Albuterol as needed;Symbicort daily and Singulair at bedtime  . Emphysema   . Myocardial infarction ([place]) 04/1998  . Coronary artery disease   . Asthma   . Shortness of breath     with exertion  . History of bronchitis   . Aspiration pneumonia ([place]) 2010  . Headache, chronic daily   . History of migraine     last migraine a couple of days ago;takes Excedrin Migraine  . Chronic back pain     compression fracture  . History of colon polyps   . Mood change ([place])     after Brain surgery mood changed and was placed on  Depakote  . Insomnia     takes Benadryl nightly  . 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 Prostate cancer ([place]) 2004  . Lung cancer [facility])     Past Surgical History  Procedure Laterality Date  . Cholecystectomy  1996  . 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 Annette Stable  . Vascular stent  2000    Dr. Rollene Fare; he reports cardiac stent, but denies stent for PAD 06/15/13  . Hand surgery  1989    crushed thumb, Dr. Fredna Dow  . Ulnar nerve repair  1998    left arm, Dr. Fredna Dow  . Gastrostomy w/ feeding tube  2008    Dr. Watt Climes; only had for a few months  . Prostatectomy  04/2001    removal of prostate cancer, Dr. Janice Norrie  . Coronary angioplasty with stent placement  04/1998  . Carpal tunnel release  1998    right  . Spine surgery    . 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 Back surgery  08/2004; 02/2005; 04/2006; 06/2007; 7/20101    all by Dr. Annette Stable  . Esophagogastroduodenoscopy N/A 07/23/2012    Procedure: ESOPHAGOGASTRODUODENOSCOPY (EGD);  Surgeon: Altamese Dilling  Drema Balzarine, MD;  Location: [facility] ENDOSCOPY;  Service: Endoscopy;  Laterality: N/A;  buccini /ja  . Colonoscopy    . Back surgery  05/2013  . Dg biopsy lung      Filed Vitals:   07/23/15 0805 07/23/15 0826 07/23/15 0834  Pulse: 103 114   SpO2: 90% 89% 90%        Subjective Assessment - 07/23/15 0805    Subjective "I have exciting news: I'm getting a ramp. It's going ot be a zig-zag ramp, and the church is going to pay for it. It should be ready in about 2 weeks."   Patient is accompained by: Family member  wife, [name]   Pertinent History * Monitor SpO2 closely *   PMH: PNA, lung CA (diagnosed 11/2014; currently no radiation or chemo), COPD, CVA (1998),  aneurysm clipping (1999), kyphoscoliosis, thoracolumbar fusion x8 levels, HTN, HLD, prostate cancer (2004), MI (2000), depression, anxiety, bipolar disorder   Patient Stated Goals "I'd like to keep working on the stairs, so I feel more in control. I'd like to keep working on my endurance, and to be  able to push my wheelchair further without getting so tired."   Currently in Pain? Yes   Pain Score 5    Pain Location Rib cage   Pain Orientation Right   Pain Descriptors / Indicators Shooting   Pain Type Acute pain   Pain Onset 1 to 4 weeks ago   Pain Frequency Constant   Aggravating Factors  breathing, movement   Pain Relieving Factors staying still   Multiple Pain Sites Yes   Pain Score 5   Pain Location Back   Pain Orientation Lower   Pain Descriptors / Indicators Aching   Pain Onset More than a month ago   Pain Frequency Constant   Aggravating Factors  prolonged sitting; prolong standing, walking   Pain Relieving Factors movement; repositioning   Effect of Pain on Daily Activities limits standing and walking tolerance                         [facility] Adult PT Treatment/Exercise - 07/23/15 0001    Ambulation/Gait   Ambulation/Gait Yes   Ambulation/Gait Assistance 5: Supervision   Ambulation/Gait Assistance Details Gait x115' then x151' (rest break for > 5 minutes between trials) over level, indoor surfaces with RW and (S), no overt LOB. See Vital Signs tab for SpO2 and HR after ambulation.   Ambulation Distance (Feet) 151 Feet  and 115' consecutively   Assistive device Rolling walker;Other (Comment);R Forearm Crutch;L Forearm Crutch  R AFO    Gait Pattern Decreased step length - left;Decreased stride length;Decreased stance time - right;Decreased hip/knee flexion - right;Decreased hip/knee flexion - left;Decreased weight shift to right;Right flexed knee in stance;Left flexed knee in stance;Trunk flexed;Step-through pattern  improved R step length, foot clearance/placement with AFO   Ambulation Surface Level;Indoor   Educational psychologist Assistance 5: Water engineer Both upper Solicitor;Needs assistance  Independent brake management; assist w/ foot rest  management   Distance 175   Comments Seated rest break x15 seconds after w/c propulsion x1 minute due to SpO2 89%.                PT Education - 07/23/15 1252    Education provided Yes   Education Details Walking program initiated; see Pt Instructions for details. Explained relationship between immobility  and pneumonia. Discussed tentative plan to DC after next session.    Person(s) Educated Patient   Methods Explanation   Comprehension Verbalized understanding          PT Short Term Goals - 06/02/15 1624    PT SHORT TERM GOAL #1   Title Pt will independently perform HEP to maximize functional gains made in PT.    (Target date: 06/04/15)   Baseline Met 4/10.   Status Achieved   PT SHORT TERM GOAL #2   Title Orthotist will be present for PT session to assess/address if pt is appropriate candidate for R AFO to improve safety with short-distance household ambulation.   (Target date: 06/04/15)   Baseline Orthotist present for session on 4/10.   Patient scheduled to receive personal R AFO week of 4/24.   Status Achieved   PT SHORT TERM GOAL #3   Title Schedule consult for custom w/c to improve positioning, postural alignment, contracture prevention, preservation of skin integrity, and improved breath support.    (Target date: 06/04/15)   Baseline Custom wheelchair assessment occurred on 4/18. Pt scheduled to receive new ultra light custom wheelchair 6-10 weeks from assessment.     Status Achieved   PT SHORT TERM GOAL #4   Title Pt will perform sit > stand from w/c with min A using LRAD to indicate increased safety with functional transfers.    (Target date: 06/04/15)   Baseline Met 4/3.   Status Achieved   PT SHORT TERM GOAL #5   Title Pt will ambulate x20' with Min A using LRAD with SpO2 > 89% to indicate improved activity tolerance.    (Target date: 06/04/15)   Baseline Met 4/3.   Status Achieved   PT SHORT TERM GOAL #6   Title Pt will negotiate 6 stairs with single R rail to  with Min A with SpO2 > 89% to indicate increased pt safety using primary home entrance.  (Target date: 06/04/15)   Baseline Met 4/20.   Status Achieved           PT Long Term Goals - 07/23/15 0820    PT LONG TERM GOAL #1   Title Pt will perform sit > stand from w/c with mod I using LRAD to indicate increased safety with functional transfers.    (Modified target date: 08/09/15)   Baseline --   Status On-going   PT LONG TERM GOAL #2   Title Pt will ambulate 47' with Mod I using LRAD with SpO2 > 89% to indicate increased safety with household ambulation.      (Modified target date: 08/09/15)   Baseline 6/12: ambulation x151' with SpO2 90%  Continue goal through renewed POC.   Status Achieved   PT LONG TERM GOAL #3   Title Pt will negotiate 6 stairs with single R rail to with supervision with SpO2 > 89% to indicate increased pt safety using primary home entrance.    (Modified target date: 08/09/15)   Baseline Deferred due to pt having ramp built; should be completed by 6/26.   Status Deferred   PT LONG TERM GOAL #4   Title Pt will perform self-propulsion of w/c > 150' over level, indoor surfaces with mod I and SpO2 > 89% to indicate increased independence with w/c mobility.   (Modified target date: 08/09/15)   Baseline --   Status On-going               Plan - 07/23/15 1302    Clinical Impression  Statement Noted MD order to resume PT without restrictions due to no R rib fracture. Current session focused on assessing/addressing pt tolerance to ambulation and w/c propulsion. Pt met LTG for ambulation; deferred goal for stair negotiation due to pt having ramp built (should be done in 2 weeks). Educated pt on relationship between immobility and pneumona. Initiated walking program. Tentatively plan to DC at next session, as all goals met with exception of LTG for w/c propulsion. Pt/wife verbalized understanding and were in full agreement.    Rehab Potential Fair   Clinical Impairments  Affecting Rehab Potential complex medical history; chronic pneumonia   PT Treatment/Interventions ADLs/Self Care Home Management;DME Instruction;Stair training;Gait training;Neuromuscular re-education;Functional mobility training;Therapeutic activities;Therapeutic exercise;Balance training;Orthotic Fit/Training;Patient/family education;Wheelchair mobility training;Vestibular   PT Next Visit Plan Plan to DC, if all goals met. Ask about walking program.    Consulted and Agree with Plan of Care Patient;Family member/caregiver   Family Member Consulted wife, [name]      Patient will benefit from skilled therapeutic intervention in order to improve the following deficits and impairments:  Abnormal gait, Cardiopulmonary status limiting activity, Decreased activity tolerance, Decreased balance, Decreased endurance, Decreased skin integrity, Decreased strength, Difficulty walking, Impaired sensation, Postural dysfunction, Pain, Dizziness  Visit Diagnosis: Other abnormalities of gait and mobility  Unsteadiness on feet  Muscle weakness (generalized)     Problem List Patient Active Problem List   Diagnosis Date Noted  . Right-sided chest wall pain 07/16/2015  . Aspiration pneumonia ([place]) 06/25/2015  . Tinea cruris 06/05/2015  . Sepsis ([place]) 04/01/2015  . CAP (community acquired pneumonia) 04/01/2015  . Cancer of lower lobe of right lung ([place]) 04/01/2015  . Chronic respiratory failure with hypoxia ([place]) 04/01/2015  . Cough 03/08/2015  . Dysphagia 02/01/2015  . Primary cancer of right lower lobe of lung ([place]) 12/05/2014  . Lung mass   . Pressure sore on elbow 05/10/2014  . Abscess of buttock, right 04/12/2014  . Recurrent aspiration pneumonia ([place]) 11/16/2013  . Toenail fungus 11/06/2013  . HCAP (healthcare-associated pneumonia) 10/23/2013  . Hypotension 10/16/2013  . 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 CAD (coronary artery disease) 07/27/2013  . Thoracic compression fracture ([place])  06/21/2013  . Thoracic spine fracture ([place]) 06/21/2013  . Giant comedone 11/04/2012  . Nausea with vomiting 07/24/2012  . Unspecified constipation 07/24/2012  . Abdominal pain, acute, epigastric 07/22/2012  . Hyponatremia 07/22/2012  . Chronic lower back pain 07/22/2012  . Transaminitis 07/22/2012  . Prostate cancer ([place]) 04/22/2012  . Routine general medical examination at a health care facility 04/22/2012  . Pseudomonas infection 11/24/2011  . E coli bacteremia 11/24/2011  . Postoperative infection 11/24/2011  . Anemia associated with acute blood loss   . Bacteremia due to Pseudomonas 10/04/2011  . Sepsis(995.91) 10/02/2011  . 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 Falls frequently 07/31/2011  . COPD  GOLD III with reversibility  01/14/2011  . Allergic rhinitis, seasonal 12/18/2010  . Angioedema 10/31/2010  . Neoplasm of uncertain behavior of skin 05/07/2010  . MYOCARDIAL INFARCTION 02/20/2010  . C V A / STROKE 02/20/2010  . CANDIDIASIS 02/13/2010  . Hyperlipidemia 02/13/2010  . Bipolar disorder ([place]) 02/13/2010  . DEPRESSION 02/13/2010  . Essential hypertension 02/13/2010  . TREMOR 02/13/2010  . PROSTATE CANCER, HX OF 02/13/2010  . Hx of TIA (transient ischemic attack) and stroke 02/13/2010  . Chronic daily headache 02/13/2010   Billie Ruddy, PT, DPT [facility] [address] [place], [place], [address] Phone: [phone]   Fax:  [phone]  07/23/2015, 1:08 PM  Name: ARNETT DUDDY MRN: [id number] Date of Birth: August 18, 1945

## 2015-07-23 NOTE — Patient Instructions (Addendum)
      SMALL BITES and SMALL SIPS   HOLD 1 second in your mouth before you swallow   SWALLOW TWICE   FOOD - LIQUID - FOOD - LIQUID - (etc)  ===============================================  Eat foods that don't take much chewing  Do 10-20 hard swallows, three times a day.

## 2015-07-24 ENCOUNTER — Ambulatory Visit (INDEPENDENT_AMBULATORY_CARE_PROVIDER_SITE_OTHER): Payer: Medicare Other | Admitting: Neurology

## 2015-07-24 DIAGNOSIS — R531 Weakness: Secondary | ICD-10-CM | POA: Diagnosis not present

## 2015-07-24 DIAGNOSIS — R1310 Dysphagia, unspecified: Secondary | ICD-10-CM | POA: Diagnosis not present

## 2015-07-24 DIAGNOSIS — R29898 Other symptoms and signs involving the musculoskeletal system: Secondary | ICD-10-CM

## 2015-07-24 NOTE — Therapy (Signed)
[facility] [address] [place], [place], [address] Phone: [phone]   Fax:  [phone]  Speech Language Pathology Evaluation  Patient Details  Name: Malik Zuniga MRN: [id number] Date of Birth: [birth year] Referring Provider: Alonza Bogus, D.O.  Encounter Date: 07/23/2015      End of Session - 07/23/15 1734    Visit Number 1   Number of Visits 17   Date for SLP Re-Evaluation 10/05/15   SLP Start Time 0935   SLP Stop Time  1016   SLP Time Calculation (min) 41 min   Activity Tolerance Patient limited by fatigue  pt somnolent last 5 minutes of session      Past Medical History  Diagnosis Date  . Hyperlipidemia     takes Simvastatin daily  . Tremor   . History of prostate cancer   . On home O2   . Neuromuscular disorder ([place]) 1998    right carpal tunnel release  . Anemia associated with acute blood loss   . GERD (gastroesophageal reflux disease)     takes Omeprazole daily  . Hypertension     takes Metoprolol daily as well as Hyzaar  . Constipation     takes Colace daily as well as Miralax  . Anxiety     takes Valium daily  . Depression     takes Cymbalta daily  . Emphysema of lung (HCC)     Albuterol as needed;Symbicort daily and Singulair at bedtime  . Emphysema   . Myocardial infarction ([place]) 04/1998  . Coronary artery disease   . Asthma   . Shortness of breath     with exertion  . History of bronchitis   . Aspiration pneumonia ([place]) 2010  . Headache, chronic daily   . History of migraine     last migraine a couple of days ago;takes Excedrin Migraine  . Chronic back pain     compression fracture  . History of colon polyps   . Mood change ([place])     after Brain surgery mood changed and was placed on Depakote  . Insomnia     takes Benadryl nightly  . Stroke [facility]) 1998    Brain Aneurysm  . Prostate cancer ([facility]) 2004  . Lung cancer [facility])     Past Surgical History  Procedure Laterality  Date  . Cholecystectomy  1996  . 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 Annette Stable  . Vascular stent  2000    Dr. Rollene Fare; he reports cardiac stent, but denies stent for PAD 06/15/13  . Hand surgery  1989    crushed thumb, Dr. Fredna Dow  . Ulnar nerve repair  1998    left arm, Dr. Fredna Dow  . Gastrostomy w/ feeding tube  2008    Dr. Watt Climes; only had for a few months  . Prostatectomy  04/2001    removal of prostate cancer, Dr. Janice Norrie  . Coronary angioplasty with stent placement  04/1998  . Carpal tunnel release  1998    right  . Spine surgery    . 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 Back surgery  08/2004; 02/2005; 04/2006; 06/2007; 7/20101    all by Dr. Annette Stable  . Esophagogastroduodenoscopy N/A 07/23/2012    Procedure: ESOPHAGOGASTRODUODENOSCOPY (EGD);  Surgeon: Jeryl [place], MD;  Location[facility];  Service: Endoscopy;  Laterality: N/A;  buccini /ja  . Colonoscopy    . Back surgery  05/2013  . Dg biopsy  lung      There were no vitals filed for this visit.      Subjective Assessment - 07/23/15 0951    Subjective Pt with x3 admission for PNA since October 2016.   Patient is accompained by: --  wife            SLP Evaluation [facility] - 07/24/15 1725    SLP Visit Information   SLP Received On 07/23/15   Referring Provider Tat, Wells Guiles, D.O.   Onset Date Appromimately 10 years, but worsening s/s approximately 6 months ago. Hypothesis was this is a reaction to a long-standing medication.   Medical Diagnosis Dysphagia   Pain Assessment   Currently in Pain? Yes   Pain Score 5    Pain Location Rib cage   Pain Orientation Right   Pain Type Acute pain   Pain Onset 1 to 4 weeks ago   Pain Frequency Constant   Pain Relieving Factors resting   General Information   HPI 70 y.o. male underwent OPMBS 07-05-15 with results under "imaging". due to ongoing complaint of dysphagia.  He reports coughing with liquids and ground meats, but his wife feels he does not cough with liquids frequently. He  believes his swallowing skills have decr'd in the last 6 months. MBSS rec'd consider diet change to dys I/nectar with small sips/bites, alternating bites/sips, hold bolus in mouth prior to transit to pharynx, whole meds in puree.    Cognition   Overall Cognitive Status Within Functional Limits for tasks assessed   Auditory Comprehension   Overall Auditory Comprehension Appears within functional limits for tasks assessed   Verbal Expression   Overall Verbal Expression Appears within functional limits for tasks assessed   Oral Motor/Sensory Function   Overall Oral Motor/Sensory Function Impaired   Labial ROM Within Functional Limits   Labial Symmetry Within Functional Limits   Labial Strength Reduced   Lingual ROM Reduced right;Reduced left  more likely due to reduced strength    Lingual Symmetry Within Functional Limits   Lingual Strength Reduced   Lingual Coordination Reduced   Velum Within Functional Limits   Overall Oral Motor/Sensory Function pt fatigued very quickly during oral motor testing; somnolent   Motor Speech   Overall Motor Speech Impaired at baseline   Respiration Impaired   Level of Impairment Phrase   Phonation Hoarse;Low vocal intensity   Articulation Impaired   Level of Impairment Sentence   Intelligibility Intelligible   Effective Techniques Increased vocal intensity  raising head     Pt required SLP mod A occasionally for following aspiration precautions with POs provided today.                    SLP Education - 07/23/15 1733    Education provided Yes   Education Details Results of modified (MBSS), HEP (effortful swallows), swallow precautions   Person(s) Educated Patient;Spouse   Methods Explanation;Demonstration;Handout;Verbal cues   Comprehension Verbalized understanding;Returned demonstration          SLP Short Term Goals - 07/24/15 1738    SLP SHORT TERM GOAL #1   Title pt will complete HEP for dysphagia with occasional  min A  over three sessions   Time 4   Period Weeks   Status New   SLP SHORT TERM GOAL #2   Title pt will follow aspiration precautions with POs with rare min A during three therapy sessions   Time 4   Period Weeks   Status New   SLP  SHORT TERM GOAL #3   Title pt will tell SLP 3 s/s aspiration PNA with modified independence   Time 4   Period Weeks   Status New          SLP Long Term Goals - 07/24/15 1742    SLP LONG TERM GOAL #1   Title pt will copmlete HEP for dysphagia with modified independence over three sessions   Time 8   Period Weeks   Status New   SLP LONG TERM GOAL #2   Title pt will demo aspiration precautions with POs with modified independence over two therapy sessions   Time 8   Period Weeks   Status New          Plan - 07/24/15 1735    Clinical Impression Statement Pt presents with oropharyngeal dysphagia as indicated on MBSS of 07-05-15. Pt would benefit from skilled ST to train pt in aspiration precautions as well as assess pt success with a HEP to identify if muscle strengthening would benefit pt's safety for meals.   Speech Therapy Frequency 2x / week   Duration --  8 weeks   Treatment/Interventions Aspiration precaution training;Pharyngeal strengthening exercises;Diet toleration management by SLP;Trials of upgraded texture/liquids;Compensatory techniques;Environmental controls;SLP instruction and feedback;Patient/family education  any or all may be used   Potential to Achieve Goals Fair   Potential Considerations Severity of impairments   SLP Home Exercise Plan provided today, more execises next session   Consulted and Agree with Plan of Care Patient      Patient will benefit from skilled therapeutic intervention in order to improve the following deficits and impairments:   Dysphagia, oropharyngeal phase      G-Codes - 2015/08/11 1728    Functional Assessment Tool Used MBS report   Functional Limitations Swallowing   Swallow Current Status (X7353) At  least 20 percent but less than 40 percent impaired, limited or restricted   Swallow Goal Status (G9924) At least 20 percent but less than 40 percent impaired, limited or restricted      Problem List Patient Active Problem List   Diagnosis Date Noted  . Right-sided chest wall pain 07/16/2015  . Aspiration pneumonia ([place]) 06/25/2015  . Tinea cruris 06/05/2015  . Sepsis ([place]) 04/01/2015  . CAP (community acquired pneumonia) 04/01/2015  . Cancer of lower lobe of right lung ([place]) 04/01/2015  . Chronic respiratory failure with hypoxia ([place]) 04/01/2015  . Cough 03/08/2015  . Dysphagia 02/01/2015  . Primary cancer of right lower lobe of lung ([place]) 12/05/2014  . Lung mass   . Pressure sore on elbow 05/10/2014  . Abscess of buttock, right 04/12/2014  . Recurrent aspiration pneumonia ([place]) 11/16/2013  . Toenail fungus 11/06/2013  . HCAP (healthcare-associated pneumonia) 10/23/2013  . Hypotension 10/16/2013  . 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 CAD (coronary artery disease) 07/27/2013  . Thoracic compression fracture ([place]) 06/21/2013  . Thoracic spine fracture ([place]) 06/21/2013  . Giant comedone 11/04/2012  . Nausea with vomiting 07/24/2012  . Unspecified constipation 07/24/2012  . Abdominal pain, acute, epigastric 2012/08/10  . Hyponatremia Aug 10, 2012  . Chronic lower back pain August 10, 2012  . Transaminitis 2012-08-10  . Prostate cancer ([place]) 04/22/2012  . Routine general medical examination at a health care facility 04/22/2012  . Pseudomonas infection 11/24/2011  . E coli bacteremia 11/24/2011  . Postoperative infection 11/24/2011  . Anemia associated with acute blood loss   . Bacteremia due to Pseudomonas 10/04/2011  . Sepsis(995.91) 10/02/2011  . 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 Falls frequently 07/31/2011  .  COPD  GOLD III with reversibility  01/14/2011  . Allergic rhinitis, seasonal 12/18/2010  . Angioedema 10/31/2010  . Neoplasm of uncertain behavior of skin 05/07/2010  .  MYOCARDIAL INFARCTION 02/20/2010  . C V A / STROKE 02/20/2010  . CANDIDIASIS 02/13/2010  . Hyperlipidemia 02/13/2010  . Bipolar disorder ([place]) 02/13/2010  . DEPRESSION 02/13/2010  . Essential hypertension 02/13/2010  . TREMOR 02/13/2010  . PROSTATE CANCER, HX OF 02/13/2010  . Hx of TIA (transient ischemic attack) and stroke 02/13/2010  . Chronic daily headache 02/13/2010    [facility] ,MS, CCC-SLP   07/24/2015, 5:44 PM  [facility] [address] [place], [place], [address] Phone: [phone]   Fax:  [phone]  Name: SAHEED CARRINGTON MRN: [id number] Date of Birth: [birth year]

## 2015-07-24 NOTE — Procedures (Signed)
[facility]  [place], [place]  [place], [place] [address] Tel: [phone] Fax:  [phone] Test Date:  07/24/2015  Patient: Malik Zuniga DOB: 09-08-1945 Physician: Narda Amber, DO  Sex: Male Height: '5\' 10"'$  Ref Phys: Alonza Bogus, M.D.  ID#: [id number] Temp: 33.4C Technician: Jerilynn Mages. Dean   Patient Complaints: This is a 70 year old gentleman referred for evaluation of generalized weakness and specifically head drop.  NCV & EMG Findings: Extensive electrodiagnostic testing of the right upper and lower extremity shows: 1. Right median and mixed palmar sensory responses are within normal limits. Right ulnar sensory response is mildly prolonged. 2. Right median and ulnar motor responses are within normal limits. 3. Right sural and superficial peroneal sensory responses are absent. 4. Right peroneal motor response recording at the extensor digitorum brevis is markedly reduced and slowed. Right tibial and peroneal motor response recording the tibialis anterior are within normal limits. 5. Repetitive nerve stimulation of the spinal accessory and median nerves recording at the abductor pollicis brevis and trapezius muscles, respectively is within normal limits. 6. In the right upper extremity, there is no evidence of active or chronic motor axon loss changes affecting any of the tested muscles.  Specifically, there is no evidence of short, complex motor units. 7. In the right lower extremity, chronic motor axon loss changes are seen affecting the L5 myotome, without accompanied active denervation.  Impression: 1. 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 2. Chronic L5 radiculopathy affecting the right lower extremity, mild in degree electrically. 3. There is no evidence of a diffuse myopathy or neuromuscular junction disorder.   ___________________________ Narda Amber, DO    Nerve Conduction  Studies Anti Sensory Summary Table   Site NR Peak (ms) Norm Peak (ms) P-T Amp (V) Norm P-T Amp  Right Median Anti Sensory (2nd Digit)  Wrist    3.5 <3.8 11.4 >10  Right Sup Peroneal Anti Sensory (Ant Lat Mall)  12 cm NR  <4.6  >3  Right Sural Anti Sensory (Lat Mall)  Calf NR  <4.6  >3  Right Ulnar Anti Sensory (5th Digit)  Wrist    3.7 <3.2 8.1 >5   Motor Summary Table   Site NR Onset (ms) Norm Onset (ms) O-P Amp (mV) Norm O-P Amp Site1 Site2 Delta-0 (ms) Dist (cm) Vel (m/s) Norm Vel (m/s)  Right Median Motor (Abd Poll Brev)  Wrist    3.5 <4.0 9.5 >5 Elbow Wrist 4.7 25.0 53 >50  Elbow    8.2  8.8         Right Peroneal Motor (Ext Dig Brev)  Ankle    5.4 <6.0 0.3 >2.5 B Fib Ankle 16.6 34.0 20 >40  B Fib    22.0  0.2  Poplt B Fib 4.3 10.0 23 >40  Poplt    26.3  0.2         Right Peroneal TA Motor (Tib Ant)  Fib Head    2.8 <4.5 3.2 >3 Poplit Fib Head 1.7 10.0 59 >40  Poplit    4.5  2.9         Right Tibial Motor (Abd Hall Brev)  Ankle    3.4 <6.0 11.3 >4 Knee Ankle 10.0 41.0 41 >40  Knee    13.4  5.3         Right Ulnar Motor (Abd Dig Minimi)  Wrist    2.9 <3.1 7.5 >7 B Elbow Wrist 3.7 21.0 57 >50  B Elbow    6.6  7.5  A Elbow B Elbow 1.5 10.0 67 >50  A Elbow    8.1  7.8          Comparison Summary Table   Site NR Peak (ms) Norm Peak (ms) P-T Amp (V) Site1 Site2 Delta-P (ms) Norm Delta (ms)  Right Median/Ulnar Palm Comparison (Wrist - 8cm)  Median Palm    2.0 <2.2 28.7 Median Palm Ulnar Palm 0.1   Ulnar Palm    2.1 <2.2 12.2       EMG   Side Muscle Ins Act Fibs Psw Fasc Number Recrt Dur Dur. Amp Amp. Poly Poly. Comment  Right 1stDorInt Nml Nml Nml Nml Nml Nml Nml Nml Nml Nml Nml Nml N/A  Right Flex Dig Long Nml Nml Nml Nml 1- Mod-R Many 1+ Many 1+ Nml Nml N/A  Right AntTibialis Nml Nml Nml Nml 1- Rapid Some 1+ Some 1+ Some 1+ N/A  Right Gastroc Nml Nml Nml Nml Nml Nml Nml Nml Nml Nml Nml Nml N/A  Right RectFemoris Nml Nml Nml Nml 1- Mod-V Nml Nml Nml Nml Nml Nml N/A    Right GluteusMed Nml Nml Nml Nml 1- Rapid Some 1+ Some 1+ Nml Nml N/A  Right FlexPolLong Nml Nml Nml Nml Nml Nml Nml Nml Nml Nml Nml Nml N/A  Right Ext Indicis Nml Nml Nml Nml Nml Nml Nml Nml Nml Nml Nml Nml N/A  Right PronatorTeres Nml Nml Nml Nml Nml Nml Nml Nml Nml Nml Nml Nml N/A  Right Biceps Nml Nml Nml Nml Nml Nml Nml Nml Nml Nml Nml Nml N/A  Right Triceps Nml Nml Nml Nml Nml Nml Nml Nml Nml Nml Nml Nml N/A  Right Deltoid Nml Nml Nml Nml Nml Nml Nml Nml Nml Nml Nml Nml N/A  Right Infraspinatus Nml Nml Nml Nml Nml Nml Nml Nml Nml Nml Nml Nml N/A  Right Cervical Parasp Low CRD Nml Nml Nml Nml Nml Nml Nml Nml Nml Nml Nml N/A    RNS   Trial # Label Amp 1 (mV)  O-P Amp 5 (mV)  O-P Amp % Dif Area 1 (mVms) Area 5 (mVms) Area % Dif Rep Rate Train Length Pause Time (min:sec) Comments  Right Abd Poll Brev  Tr 1 Baseline 8.29 8.30 0.2 21.47 20.36 -5.2 3.00 10 00:30   Tr 2 Post Exercise 8.72 8.81 1.1 23.44 22.31 -4.8 3.00 10 01:00   Tr 3 1 Min Post 9.02 9.04 0.2 22.87 21.56 -5.8 3.00 10 01:00   Tr 4 2 Min Post 8.94 8.87 -0.7 22.45 21.07 -6.1 3.00 10 01:00   Tr 5 3 Min Post 8.99 8.83 -1.8 22.52 21.03 -6.6 3.00 10 00:00   Right Trapezius  Tr 1 Baseline 3.47 3.42 -1.4 25.00 23.74 -5.0 3.00 10 00:30   Tr 2 Post Exercise 3.49 3.43 -1.6 25.42 24.07 -5.3 3.00 10 01:00   Tr 3 1 Min Post 3.45 3.40 -1.4 25.06 23.98 -4.3 3.00 10 01:00   Tr 4 2 Min Post 3.46 3.38 -2.5 25.26 23.54 -6.8 3.00 10 01:00   Tr 5 3 Min Post 3.39 3.37 -0.6 24.87 23.42 -5.8 3.00 10 00:00           Waveforms:

## 2015-07-25 ENCOUNTER — Telehealth: Payer: Self-pay | Admitting: Neurology

## 2015-07-25 ENCOUNTER — Ambulatory Visit: Payer: Medicare Other | Admitting: Physical Therapy

## 2015-07-25 DIAGNOSIS — R2689 Other abnormalities of gait and mobility: Secondary | ICD-10-CM

## 2015-07-25 DIAGNOSIS — R2681 Unsteadiness on feet: Secondary | ICD-10-CM | POA: Diagnosis not present

## 2015-07-25 DIAGNOSIS — R498 Other voice and resonance disorders: Secondary | ICD-10-CM | POA: Diagnosis not present

## 2015-07-25 DIAGNOSIS — R1310 Dysphagia, unspecified: Secondary | ICD-10-CM

## 2015-07-25 DIAGNOSIS — R251 Tremor, unspecified: Secondary | ICD-10-CM

## 2015-07-25 DIAGNOSIS — R293 Abnormal posture: Secondary | ICD-10-CM | POA: Diagnosis not present

## 2015-07-25 DIAGNOSIS — M6281 Muscle weakness (generalized): Secondary | ICD-10-CM

## 2015-07-25 DIAGNOSIS — R1312 Dysphagia, oropharyngeal phase: Secondary | ICD-10-CM | POA: Diagnosis not present

## 2015-07-25 DIAGNOSIS — R29898 Other symptoms and signs involving the musculoskeletal system: Secondary | ICD-10-CM

## 2015-07-25 NOTE — Telephone Encounter (Signed)
Yes and I believe that his need to be done on different days (per pt) because of length of time and difficulty laying flat

## 2015-07-25 NOTE — Telephone Encounter (Signed)
-----   Message from [place], DO sent at 07/25/2015 12:54 PM EDT ----- Give patient results - PN and chronic R L5 radiculopathy.  Doesn't explain sx's and would still recommend MRI c-spine and brain if able.

## 2015-07-25 NOTE — Telephone Encounter (Signed)
Patient made aware of results. He is agreeable to MR's at [facility]. Is this without contrast?

## 2015-07-25 NOTE — Therapy (Signed)
[facility] [address] [place], [place], [address] Phone: [phone]   Fax:  [phone]  Physical Therapy Treatment  Patient Details  Name: Malik Zuniga MRN: [id number] Date of Birth: 1945/03/01 Referring Provider: Simonne Maffucci, MD  Encounter Date: 07/25/2015      PT End of Session - 07/25/15 1236    Visit Number 14   Number of Visits 20   Date for PT Re-Evaluation 09/10/15   Authorization Type Medicare primary; BCBS secondary - G Codes and PN required every 10 visits   PT Start Time 1146   PT Stop Time 1204  ended early due to goals met, patient discharged   PT Time Calculation (min) 18 min   Equipment Utilized During Treatment Gait belt   Activity Tolerance Other (comment)  limited by decreased SpO2   Behavior During Therapy [facility] for tasks assessed/performed      Past Medical History  Diagnosis Date  . Hyperlipidemia     takes Simvastatin daily  . Tremor   . History of prostate cancer   . On home O2   . Neuromuscular disorder ([place]) 1998    right carpal tunnel release  . Anemia associated with acute blood loss   . GERD (gastroesophageal reflux disease)     takes Omeprazole daily  . Hypertension     takes Metoprolol daily as well as Hyzaar  . Constipation     takes Colace daily as well as Miralax  . Anxiety     takes Valium daily  . Depression     takes Cymbalta daily  . Emphysema of lung ([facility])     Albuterol as needed;Symbicort daily and Singulair at bedtime  . Emphysema   . Myocardial infarction ([place]) 04/1998  . Coronary artery disease   . Asthma   . Shortness of breath     with exertion  . History of bronchitis   . Aspiration pneumonia ([place]) 2010  . Headache, chronic daily   . History of migraine     last migraine a couple of days ago;takes Excedrin Migraine  . Chronic back pain     compression fracture  . History of colon polyps   . Mood change ([place])     after Brain surgery mood  changed and was placed on Depakote  . Insomnia     takes Benadryl nightly  . 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 Prostate cancer ([place]) 2004  . Lung cancer [facility])     Past Surgical History  Procedure Laterality Date  . Cholecystectomy  1996  . 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 Annette Stable  . Vascular stent  2000    Dr. Rollene Fare; he reports cardiac stent, but denies stent for PAD 06/15/13  . Hand surgery  1989    crushed thumb, Dr. Fredna Dow  . Ulnar nerve repair  1998    left arm, Dr. Fredna Dow  . Gastrostomy w/ feeding tube  2008    Dr. Watt Climes; only had for a few months  . Prostatectomy  04/2001    removal of prostate cancer, Dr. Janice Norrie  . Coronary angioplasty with stent placement  04/1998  . Carpal tunnel release  1998    right  . Spine surgery    . 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 Back surgery  08/2004; 02/2005; 04/2006; 06/2007; 7/20101    all by Dr. Annette Stable  . Esophagogastroduodenoscopy N/A 07/23/2012  Procedure: ESOPHAGOGASTRODUODENOSCOPY (EGD);  Surgeon: Jeryl Columbia, MD;  Location: [facility] ENDOSCOPY;  Service: Endoscopy;  Laterality: N/A;  buccini /ja  . Colonoscopy    . Back surgery  05/2013  . Dg biopsy lung      There were no vitals filed for this visit.      Subjective Assessment - 07/25/15 1152    Subjective Pt reports no significant changes since last session. Pt/wife feel pt ready is for DC from PT today.   Patient is accompained by: Family member  wife, [name]   Pertinent History * Monitor SpO2 closely *   PMH: PNA, lung CA (diagnosed 11/2014; currently no radiation or chemo), COPD, CVA (1998),  aneurysm clipping (1999), kyphoscoliosis, thoracolumbar fusion x8 levels, HTN, HLD, prostate cancer (2004), MI (2000), depression, anxiety, bipolar disorder   Patient Stated Goals "I'd like to keep working on the stairs, so I feel more in control. I'd like to keep working on my endurance, and to be able to push my wheelchair further without getting so tired."   Currently in  Pain? Yes   Pain Score 4    Pain Location Rib cage   Pain Orientation Right   Pain Descriptors / Indicators Shooting   Pain Type Acute pain   Pain Onset 1 to 4 weeks ago   Pain Frequency Constant   Aggravating Factors  breathing, movement   Pain Relieving Factors resting   Multiple Pain Sites Yes   Pain Score 4   Pain Location Back   Pain Orientation Lower   Pain Descriptors / Indicators Aching   Pain Type Chronic pain   Pain Onset More than a month ago   Pain Frequency Constant   Aggravating Factors  prolonged sitting; prolong standing, walking   Pain Relieving Factors movement, repositoining   Effect of Pain on Daily Activities limits standing and walking tolerance                         [facility] Adult PT Treatment/Exercise - 07/25/15 0001    Transfers   Transfers Sit to Stand;Stand to Sit   Sit to Stand 6: Modified independent (Device/Increase time)   Sit to Stand Details (indicate cue type and reason) from w/c, from standard chair with RW   Stand to Sit 6: Modified independent (Device/Increase time)   Stand to Sit Details with Occupational hygienist Yes   Wheelchair Assistance 5: Water engineer Both upper extremities   Wheelchair Parts Management Independent;Needs assistance  Independent brake management; assist w/ foot rest management   Distance 150   Comments SpO2 86% at end of trial; however, noted that arm rests colliding with top of wheels, causing increased friction and decreasing efficiency of movement.                PT Education - 07/25/15 1221    Education provided Yes   Education Details PT goals, findings, progress, and DC plan. This PT will contact patient after rep from [facility] calls back.   Person(s) Educated Patient;Spouse   Methods Explanation   Comprehension Verbalized understanding          PT Short Term Goals - 06/02/15 1624    PT SHORT TERM GOAL #1   Title Pt will  independently perform HEP to maximize functional gains made in PT.    (Target date: 06/04/15)   Baseline Met 4/10.   Status Achieved   PT  SHORT TERM GOAL #2   Title Orthotist will be present for PT session to assess/address if pt is appropriate candidate for R AFO to improve safety with short-distance household ambulation.   (Target date: 06/04/15)   Baseline Orthotist present for session on 4/10.   Patient scheduled to receive personal R AFO week of 4/24.   Status Achieved   PT SHORT TERM GOAL #3   Title Schedule consult for custom w/c to improve positioning, postural alignment, contracture prevention, preservation of skin integrity, and improved breath support.    (Target date: 06/04/15)   Baseline Custom wheelchair assessment occurred on 4/18. Pt scheduled to receive new ultra light custom wheelchair 6-10 weeks from assessment.     Status Achieved   PT SHORT TERM GOAL #4   Title Pt will perform sit > stand from w/c with min A using LRAD to indicate increased safety with functional transfers.    (Target date: 06/04/15)   Baseline Met 4/3.   Status Achieved   PT SHORT TERM GOAL #5   Title Pt will ambulate x20' with Min A using LRAD with SpO2 > 89% to indicate improved activity tolerance.    (Target date: 06/04/15)   Baseline Met 4/3.   Status Achieved   PT SHORT TERM GOAL #6   Title Pt will negotiate 6 stairs with single R rail to with Min A with SpO2 > 89% to indicate increased pt safety using primary home entrance.  (Target date: 06/04/15)   Baseline Met 4/20.   Status Achieved           PT Long Term Goals - 07/25/15 1237    PT LONG TERM GOAL #1   Title Pt will perform sit > stand from w/c with mod I using LRAD to indicate increased safety with functional transfers.    (Modified target date: 08/09/15)   Baseline Met 6/14.   Status Achieved   PT LONG TERM GOAL #2   Title Pt will ambulate 74' with Mod I using LRAD with SpO2 > 89% to indicate increased safety with household ambulation.       (Modified target date: 08/09/15)   Baseline 6/12: ambulation x151' with SpO2 90%  Continue goal through renewed POC.   Status Achieved   PT LONG TERM GOAL #3   Title Pt will negotiate 6 stairs with single R rail to with supervision with SpO2 > 89% to indicate increased pt safety using primary home entrance.    (Modified target date: 08/09/15)   Baseline Deferred due to pt having ramp built; should be completed by 6/26.   Status Deferred   PT LONG TERM GOAL #4   Title Pt will perform self-propulsion of w/c > 150' over level, indoor surfaces with mod I and SpO2 > 89% to indicate increased independence with w/c mobility.   (Modified target date: 08/09/15)   Baseline 6/14: SpO2 86% after w/c propulsion x150'; however, contact of arm rests with wheel causing increased friction, decreased efficiency of movement   Status Not Met               Plan - 07/25/15 1238    Clinical Impression Statement Pt has met 2 of 3 applicable LTG's and appears to have maximized stability/independence with functional mobility at this time. Therefore, patient will be discharged from outpatient PT. Pt/wife in full agreement with DC plan.    Consulted and Agree with Plan of Care Patient;Family member/caregiver   Family Member Consulted wife, [name]  Patient will benefit from skilled therapeutic intervention in order to improve the following deficits and impairments:     Visit Diagnosis: Muscle weakness (generalized)  Other abnormalities of gait and mobility       G-Codes - 2015-08-01 1241    Functional Assessment Tool Used Ambulates up to 151' with RW with SpO2 > 90%   Functional Limitation Mobility: Walking and moving around   Mobility: Walking and Moving Around Goal Status [phone]) At least 40 percent but less than 60 percent impaired, limited or restricted   Mobility: Walking and Moving Around Discharge Status [id number]) At least 20 percent but less than 40 percent impaired, limited or restricted       Problem List Patient Active Problem List   Diagnosis Date Noted  . Right-sided chest wall pain 07/16/2015  . Aspiration pneumonia ([place]) 06/25/2015  . Tinea cruris 06/05/2015  . Sepsis ([place]) 04/01/2015  . CAP (community acquired pneumonia) 04/01/2015  . Cancer of lower lobe of right lung ([place]) 04/01/2015  . Chronic respiratory failure with hypoxia ([place]) 04/01/2015  . Cough 03/08/2015  . Dysphagia 02/01/2015  . Primary cancer of right lower lobe of lung ([place]) 12/05/2014  . Lung mass   . Pressure sore on elbow 05/10/2014  . Abscess of buttock, right 04/12/2014  . Recurrent aspiration pneumonia ([place]) 11/16/2013  . Toenail fungus 11/06/2013  . HCAP (healthcare-associated pneumonia) 10/23/2013  . Hypotension 10/16/2013  . 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 CAD (coronary artery disease) 07/27/2013  . Thoracic compression fracture ([place]) 06/21/2013  . Thoracic spine fracture ([place]) 06/21/2013  . Giant comedone 11/04/2012  . Nausea with vomiting 2012/07/31  . Unspecified constipation 07/31/12  . Abdominal pain, acute, epigastric 07/22/2012  . Hyponatremia 07/22/2012  . Chronic lower back pain 07/22/2012  . Transaminitis 07/22/2012  . Prostate cancer ([place]) 04/22/2012  . Routine general medical examination at a health care facility 04/22/2012  . Pseudomonas infection 11/24/2011  . E coli bacteremia 11/24/2011  . Postoperative infection 11/24/2011  . Anemia associated with acute blood loss   . Bacteremia due to Pseudomonas 10/04/2011  . Sepsis(995.91) 10/02/2011  . 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 Falls frequently 07/31/2011  . COPD  GOLD III with reversibility  01/14/2011  . Allergic rhinitis, seasonal 12/18/2010  . Angioedema 10/31/2010  . Neoplasm of uncertain behavior of skin 05/07/2010  . MYOCARDIAL INFARCTION 02/20/2010  . C V A / STROKE 02/20/2010  . CANDIDIASIS 02/13/2010  . Hyperlipidemia 02/13/2010  . Bipolar disorder ([place]) 02/13/2010  . DEPRESSION 02/13/2010  .  Essential hypertension 02/13/2010  . TREMOR 02/13/2010  . PROSTATE CANCER, HX OF 02/13/2010  . Hx of TIA (transient ischemic attack) and stroke 02/13/2010  . Chronic daily headache 02/13/2010   PHYSICAL THERAPY DISCHARGE SUMMARY  Visits from Start of Care: 14  Current functional level related to goals / functional outcomes: See above goals and goal statuses.   Remaining deficits: Postural and gait impairments associated with longstanding deformities of spine and multiple corrective surgeries; limited functional endurance, decreased SpO2 with activity (this has greatly improved, however)   Education / Equipment: Custom ultra light manual wheelchair; R AFO; HEP and walking program; education on relationship between pneumonia and immobility Plan: Patient agrees to discharge.  Patient goals were met. Patient is being discharged due to meeting the stated rehab goals.  ?????        Billie Ruddy, PT, [facility] [address] [place], [place], [address] Phone: [phone]   Fax:  [phone] 2015-08-01,  12:43 PM  Name: Malik Zuniga MRN: [id number] Date of Birth: 05/29/1945

## 2015-07-25 NOTE — Telephone Encounter (Signed)
Ordered entered into EPIC, [facility] will call patient to schedule.

## 2015-07-26 ENCOUNTER — Encounter: Payer: Self-pay | Admitting: Radiation Oncology

## 2015-07-26 NOTE — Progress Notes (Signed)
Rec'd FMLA forms and have printed out records and given everything to Dr. Johny Shears nurse, Sam, to complete and return back to [facility].

## 2015-07-27 ENCOUNTER — Ambulatory Visit: Payer: Medicare Other | Admitting: Physical Therapy

## 2015-07-30 ENCOUNTER — Encounter: Payer: Self-pay | Admitting: *Deleted

## 2015-07-30 ENCOUNTER — Other Ambulatory Visit: Payer: Self-pay | Admitting: *Deleted

## 2015-07-30 NOTE — Patient Outreach (Addendum)
[facility]) Care Management  [place]  07/30/2015   Malik Zuniga 05-30-1945 [id number]  Subjective: RN Health Coach telephone call to patient.  [employer] compliance verified.per patient he is doing much better.  The patient stated that the pulmonary rehab has helped him a lot. Per patient his sats are 90-94%. He is doing the purse-lip breathing when needed. Per patient he is in the green zone. Patient stated he fell 2 weeks ago and  Bruised his ribs. Per patient there was no fractures. The ribs are just real sore. Patient stated  He uses his walker inside the house and he felt his knees buckle. Per patient he has some neuropathy. Patient stated he has some numbness on the right side. Patient stated he has been worked up.   Patient is trying to get a new wheelchair. The one he has is in disrepair and heavy for his wife to handle.  Patient has agreed to follow up outreach.    Objective:   Encounter Medications:  Outpatient Encounter Prescriptions as of 07/30/2015  Medication Sig Note  . ARIPiprazole (ABILIFY) 10 MG tablet Take 5 mg by mouth daily.    . Ascorbic Acid (VITAMIN C) 1000 MG tablet Take 1,000 mg by mouth daily.   . budesonide (PULMICORT) 0.25 MG/2ML nebulizer solution Take 4 mLs (0.5 mg total) by nebulization 2 (two) times daily.   . cetirizine (ZYRTEC) 10 MG tablet TAKE 1 TABLET BY MOUTH DAILY.   . cholecalciferol (VITAMIN D) 1000 UNITS tablet Take 1,000 Units by mouth every morning.    . diazepam (VALIUM) 5 MG tablet Take 1 tablet (5 mg total) by mouth every 8 (eight) hours as needed for anxiety or muscle spasms. scheduled   . divalproex (DEPAKOTE) 500 MG 24 hr tablet Take 1,000 mg by mouth at bedtime.    Marland Kitchen DM-Doxylamine-Acetaminophen (NYQUIL COLD & FLU PO) Take 2 capsules by mouth at bedtime as needed (sleep).   . docusate sodium (COLACE) 100 MG capsule Take 200 mg by mouth at bedtime.    . DULoxetine (CYMBALTA) 60 MG capsule Take 120 mg by mouth daily.     . fluticasone (FLONASE) 50 MCG/ACT nasal spray PLACE 2 SPRAYS INTO BOTH NOSTRILS DAILY. (Patient taking differently: PLACE 2 SPRAYS INTO BOTH NOSTRILS DAILY as needed for allergies)   . HYDROcodone-acetaminophen (NORCO) 10-325 MG per tablet Take 1 tablet by mouth every 6 (six) hours as needed for moderate pain. for pain 12/01/2014: .   . ipratropium-albuterol (DUONEB) 0.5-2.5 (3) MG/3ML SOLN Take 3 mLs by nebulization every 6 (six) hours.   Marland Kitchen losartan (COZAAR) 50 MG tablet Take 1 tablet (50 mg total) by mouth daily.   . metoprolol succinate (TOPROL-XL) 50 MG 24 hr tablet TAKE 1 TABLET (50 MG TOTAL) BY MOUTH EVERY MORNING. TAKE WITH OR IMMEDIATELY FOLLOWING A MEAL.   . montelukast (SINGULAIR) 10 MG tablet Take 1 tablet (10 mg total) by mouth at bedtime.   Marland Kitchen NITROSTAT 0.4 MG SL tablet PLACE 1 TABLET (0.4 MG TOTAL) UNDER THE TONGUE EVERY 5 (FIVE) MINUTES AS NEEDED FOR CHEST PAIN.   Marland Kitchen omeprazole (PRILOSEC) 40 MG capsule Take 40 mg by mouth 2 (two) times daily.    . polyethylene glycol (MIRALAX / GLYCOLAX) packet Take 17 g by mouth 3 (three) times daily as needed for moderate constipation. For constipation   . primidone (MYSOLINE) 250 MG tablet TAKE 1 TABLET (250 MG TOTAL) BY MOUTH 2 (TWO) TIMES DAILY.   Marland Kitchen promethazine (PHENERGAN) 25  MG tablet Take 25 mg by mouth every 6 (six) hours as needed for nausea or vomiting. Reported on 04/06/2015 12/01/2014: .   . ranitidine (ZANTAC) 150 MG tablet Take 150-300 mg by mouth 2 (two) times daily as needed for heartburn.   Marland Kitchen Respiratory Therapy Supplies (FLUTTER) DEVI Use as directed.   . simvastatin (ZOCOR) 40 MG tablet TAKE 1 TABLET (40 MG TOTAL) BY MOUTH EVERY EVENING.   Marland Kitchen tiZANidine (ZANAFLEX) 4 MG tablet TAKE 1 TABLET BY MOUTH EVERY 8 HOURS AS NEEDED FOR MUSCLE SPASMS.   Marland Kitchen Zinc 50 MG TABS Take 50 mg by mouth daily.    Marland Kitchen aspirin-acetaminophen-caffeine (EXCEDRIN MIGRAINE) 250-250-65 MG per tablet Take 2 tablets by mouth every 6 (six) hours as needed. Reported on  07/30/2015 07/03/2012: .    No facility-administered encounter medications on file as of 07/30/2015.    Functional Status:  In your present state of health, do you have any difficulty performing the following activities: 06/25/2015 06/07/2015  Hearing? Y N  Vision? N N  Difficulty concentrating or making decisions? N N  Walking or climbing stairs? Y Y  Dressing or bathing? N Y  Doing errands, shopping? [name]  Preparing Food and eating ? - Y  Using the Toilet? - N  In the past six months, have you accidently leaked urine? - N  Do you have problems with loss of bowel control? - N  Managing your Medications? - Y  Managing your Finances? - N  Housekeeping or managing your Housekeeping? - Y    Fall/Depression Screening: PHQ 2/9 Scores 06/07/2015 04/26/2015 03/30/2015 03/02/2015 12/29/2014 12/11/2014 11/30/2014  PHQ - 2 Score 0 '4 1 1 1 '$ 0 1  PHQ- 9 Score - 17 - - - - -   THN CM Care Plan Problem One        Most Recent Value   Care Plan Problem One  knowledge deficit in self management of COPD   Role Documenting the Problem One  [facility] [place] for Problem One  Active   THN Long Term Goal (31-90 days)  Member will not have any readmissions within the next 90 days   THN Long Term Goal Start Date  07/30/15   Interventions for Problem One Long Term Goal  RN reminded the patient the importance of taking medications as per physician order. RN Health Coach reminded patient to keep appointments with primary medical doctor and pulmonologist   [facility] CM Short Term Goal #1 (0-30 days)  Member will report and increase in appetite within the next 30 days   [facility]N CM Short Term Goal #1 Start Date  07/30/15   Interventions for Short Term Goal #1  RN sent patient additional nutritional coupons for supplemental nutrition. RN discussed the importance of nutrition to help with the healing process. Patient is on a mechanical soft and trying to prevent choking   THN CM Short Term Goal #2 (0-30 days)  Member will  be able to verbalize symptoms  of  COPD exacerbation.   [facility]N CM Short Term Goal #2 Start Date  07/30/15 [continue. Patient is trying to understand copd exacerbation versus aspiration pneumonia]   Interventions for Short Term Goal #2  RN sent patient educational information on COPD exacerbation. RN will follow up with discussion and teach back      Assessment:  Patient requested Ensure coupons be sent Patient will continue to  benefit from [place] telephonic outreach for education and support for diabetes self management.  Plan:  RN sent ensure coupons RN sent educational material on How to use a nebulizer RN sent educational material on mechanical soft diet. RN sent patient educational material on neuropathy RN sent additional educational material on falls prevention RN will follow up within 30 days for discussion and teach back [facility] [phone]

## 2015-07-31 ENCOUNTER — Telehealth: Payer: Self-pay | Admitting: Radiation Oncology

## 2015-07-31 ENCOUNTER — Telehealth: Payer: Self-pay | Admitting: Neurology

## 2015-07-31 NOTE — Telephone Encounter (Signed)
Malik Zuniga 09/23/1945. He had an EMG test last week.His wife called to let Dr. Carles Collet know that the MRI that was scheduled for this Friday 6/23 they had to cancel. He is not feeling well. They would like to try and reschedule it for another time. Her number is [phone].

## 2015-07-31 NOTE — Telephone Encounter (Signed)
Returned orange folder with CANCER CLAIM FORM-PHYSICIAN'S STATEMENT to [facility] to write in CPT codes and charges prior to physician signature

## 2015-07-31 NOTE — Telephone Encounter (Signed)
Patient has decided he does not want MR done. I encouraged him to change his mind and he will call if he does.

## 2015-08-01 ENCOUNTER — Telehealth: Payer: Self-pay | Admitting: Pulmonary Disease

## 2015-08-01 ENCOUNTER — Encounter: Payer: Self-pay | Admitting: Radiation Oncology

## 2015-08-01 ENCOUNTER — Ambulatory Visit: Payer: Medicare Other | Admitting: Physical Therapy

## 2015-08-01 MED ORDER — AMOXICILLIN-POT CLAVULANATE 875-125 MG PO TABS: 1.0000 | ORAL_TABLET | Freq: Two times a day (BID) | ORAL | Status: DC

## 2015-08-01 NOTE — Telephone Encounter (Signed)
Augmentin 875 one pill bid rx #14 Keep tomorrow's appointment

## 2015-08-01 NOTE — Progress Notes (Signed)
Paperwork (aflac) received from doctor 6/21, called patient to notify paperwork was completed, advised to mail them a copy nothing else needs to be done, 6/21 Ardeen Fillers)

## 2015-08-01 NOTE — Telephone Encounter (Signed)
Pt aware that rx is being sent to the [facility]. Keep appt as scheduled 08/02/15. Nothing further needed.

## 2015-08-01 NOTE — Telephone Encounter (Signed)
Spoke with pt. States he feels like he is getting PNA again. Reports increased SOB. Denies chest tightness/pain, wheezing, coughing or fever. While speaking to the pt he began to cry. His SOB started yesterday evening. He would like BQ's recommendations.  BQ - please advise. Thanks.

## 2015-08-01 NOTE — Telephone Encounter (Signed)
Pt has an appt with BQ 08/02/15 at 345p Pt is wanting to know if there are any rec's that can be offered to help him get through the night until he is seen tomorrow.  Please advise Dr Lamonte Sakai. Thanks.

## 2015-08-02 ENCOUNTER — Encounter: Payer: Self-pay | Admitting: Pulmonary Disease

## 2015-08-02 ENCOUNTER — Other Ambulatory Visit: Payer: Self-pay

## 2015-08-02 ENCOUNTER — Inpatient Hospital Stay (HOSPITAL_COMMUNITY)
Admission: EM | Admit: 2015-08-02 | Discharge: 2015-08-09 | DRG: 871 | Disposition: A | Discharge status: Home Health | Payer: Medicare Other | Attending: Internal Medicine | Admitting: Internal Medicine

## 2015-08-02 ENCOUNTER — Ambulatory Visit (INDEPENDENT_AMBULATORY_CARE_PROVIDER_SITE_OTHER): Payer: Medicare Other | Admitting: Pulmonary Disease

## 2015-08-02 ENCOUNTER — Encounter (HOSPITAL_COMMUNITY): Payer: Self-pay

## 2015-08-02 ENCOUNTER — Emergency Department (HOSPITAL_COMMUNITY): Payer: Medicare Other

## 2015-08-02 VITALS — BP 134/70 | HR 138 | Temp 97.8°F

## 2015-08-02 DIAGNOSIS — J471 Bronchiectasis with (acute) exacerbation: Secondary | ICD-10-CM

## 2015-08-02 DIAGNOSIS — G8929 Other chronic pain: Secondary | ICD-10-CM | POA: Diagnosis present

## 2015-08-02 DIAGNOSIS — R6520 Severe sepsis without septic shock: Secondary | ICD-10-CM

## 2015-08-02 DIAGNOSIS — J069 Acute upper respiratory infection, unspecified: Secondary | ICD-10-CM

## 2015-08-02 DIAGNOSIS — J189 Pneumonia, unspecified organism: Secondary | ICD-10-CM | POA: Diagnosis present

## 2015-08-02 DIAGNOSIS — J438 Other emphysema: Secondary | ICD-10-CM | POA: Diagnosis not present

## 2015-08-02 DIAGNOSIS — I10 Essential (primary) hypertension: Secondary | ICD-10-CM | POA: Diagnosis present

## 2015-08-02 DIAGNOSIS — I110 Hypertensive heart disease with heart failure: Secondary | ICD-10-CM | POA: Diagnosis present

## 2015-08-02 DIAGNOSIS — Z4682 Encounter for fitting and adjustment of non-vascular catheter: Secondary | ICD-10-CM | POA: Diagnosis not present

## 2015-08-02 DIAGNOSIS — Z923 Personal history of irradiation: Secondary | ICD-10-CM

## 2015-08-02 DIAGNOSIS — Z79899 Other long term (current) drug therapy: Secondary | ICD-10-CM | POA: Diagnosis not present

## 2015-08-02 DIAGNOSIS — Z9981 Dependence on supplemental oxygen: Secondary | ICD-10-CM

## 2015-08-02 DIAGNOSIS — Z888 Allergy status to other drugs, medicaments and biological substances status: Secondary | ICD-10-CM

## 2015-08-02 DIAGNOSIS — Z7951 Long term (current) use of inhaled steroids: Secondary | ICD-10-CM

## 2015-08-02 DIAGNOSIS — I2584 Coronary atherosclerosis due to calcified coronary lesion: Secondary | ICD-10-CM | POA: Diagnosis not present

## 2015-08-02 DIAGNOSIS — I480 Paroxysmal atrial fibrillation: Secondary | ICD-10-CM | POA: Insufficient documentation

## 2015-08-02 DIAGNOSIS — Z955 Presence of coronary angioplasty implant and graft: Secondary | ICD-10-CM | POA: Diagnosis not present

## 2015-08-02 DIAGNOSIS — J9600 Acute respiratory failure, unspecified whether with hypoxia or hypercapnia: Secondary | ICD-10-CM | POA: Diagnosis not present

## 2015-08-02 DIAGNOSIS — R410 Disorientation, unspecified: Secondary | ICD-10-CM | POA: Diagnosis not present

## 2015-08-02 DIAGNOSIS — I4891 Unspecified atrial fibrillation: Secondary | ICD-10-CM | POA: Diagnosis not present

## 2015-08-02 DIAGNOSIS — E876 Hypokalemia: Secondary | ICD-10-CM | POA: Diagnosis present

## 2015-08-02 DIAGNOSIS — D649 Anemia, unspecified: Secondary | ICD-10-CM | POA: Diagnosis present

## 2015-08-02 DIAGNOSIS — I4892 Unspecified atrial flutter: Secondary | ICD-10-CM | POA: Diagnosis present

## 2015-08-02 DIAGNOSIS — F4323 Adjustment disorder with mixed anxiety and depressed mood: Secondary | ICD-10-CM | POA: Insufficient documentation

## 2015-08-02 DIAGNOSIS — Z8249 Family history of ischemic heart disease and other diseases of the circulatory system: Secondary | ICD-10-CM

## 2015-08-02 DIAGNOSIS — F319 Bipolar disorder, unspecified: Secondary | ICD-10-CM | POA: Diagnosis present

## 2015-08-02 DIAGNOSIS — K5900 Constipation, unspecified: Secondary | ICD-10-CM | POA: Diagnosis present

## 2015-08-02 DIAGNOSIS — E440 Moderate protein-calorie malnutrition: Secondary | ICD-10-CM | POA: Diagnosis present

## 2015-08-02 DIAGNOSIS — E785 Hyperlipidemia, unspecified: Secondary | ICD-10-CM | POA: Diagnosis present

## 2015-08-02 DIAGNOSIS — I672 Cerebral atherosclerosis: Secondary | ICD-10-CM

## 2015-08-02 DIAGNOSIS — A419 Sepsis, unspecified organism: Secondary | ICD-10-CM

## 2015-08-02 DIAGNOSIS — M5442 Lumbago with sciatica, left side: Secondary | ICD-10-CM

## 2015-08-02 DIAGNOSIS — J449 Chronic obstructive pulmonary disease, unspecified: Secondary | ICD-10-CM | POA: Diagnosis not present

## 2015-08-02 DIAGNOSIS — C3431 Malignant neoplasm of lower lobe, right bronchus or lung: Secondary | ICD-10-CM | POA: Diagnosis present

## 2015-08-02 DIAGNOSIS — Z8673 Personal history of transient ischemic attack (TIA), and cerebral infarction without residual deficits: Secondary | ICD-10-CM

## 2015-08-02 DIAGNOSIS — F3170 Bipolar disorder, currently in remission, most recent episode unspecified: Secondary | ICD-10-CM | POA: Diagnosis not present

## 2015-08-02 DIAGNOSIS — R000 Tachycardia, unspecified: Secondary | ICD-10-CM | POA: Diagnosis not present

## 2015-08-02 DIAGNOSIS — M5441 Lumbago with sciatica, right side: Secondary | ICD-10-CM | POA: Diagnosis present

## 2015-08-02 DIAGNOSIS — I5033 Acute on chronic diastolic (congestive) heart failure: Secondary | ICD-10-CM | POA: Diagnosis present

## 2015-08-02 DIAGNOSIS — I252 Old myocardial infarction: Secondary | ICD-10-CM | POA: Diagnosis not present

## 2015-08-02 DIAGNOSIS — Z8042 Family history of malignant neoplasm of prostate: Secondary | ICD-10-CM | POA: Diagnosis not present

## 2015-08-02 DIAGNOSIS — I2510 Atherosclerotic heart disease of native coronary artery without angina pectoris: Secondary | ICD-10-CM | POA: Diagnosis present

## 2015-08-02 DIAGNOSIS — Y95 Nosocomial condition: Secondary | ICD-10-CM | POA: Diagnosis present

## 2015-08-02 DIAGNOSIS — J690 Pneumonitis due to inhalation of food and vomit: Secondary | ICD-10-CM | POA: Diagnosis not present

## 2015-08-02 DIAGNOSIS — Z87891 Personal history of nicotine dependence: Secondary | ICD-10-CM

## 2015-08-02 DIAGNOSIS — Z8546 Personal history of malignant neoplasm of prostate: Secondary | ICD-10-CM | POA: Diagnosis not present

## 2015-08-02 DIAGNOSIS — R0602 Shortness of breath: Secondary | ICD-10-CM | POA: Diagnosis not present

## 2015-08-02 DIAGNOSIS — Z452 Encounter for adjustment and management of vascular access device: Secondary | ICD-10-CM | POA: Diagnosis not present

## 2015-08-02 DIAGNOSIS — J479 Bronchiectasis, uncomplicated: Secondary | ICD-10-CM | POA: Insufficient documentation

## 2015-08-02 DIAGNOSIS — G4700 Insomnia, unspecified: Secondary | ICD-10-CM | POA: Diagnosis present

## 2015-08-02 DIAGNOSIS — R0682 Tachypnea, not elsewhere classified: Secondary | ICD-10-CM | POA: Insufficient documentation

## 2015-08-02 DIAGNOSIS — G9340 Encephalopathy, unspecified: Secondary | ICD-10-CM | POA: Diagnosis present

## 2015-08-02 DIAGNOSIS — J9601 Acute respiratory failure with hypoxia: Secondary | ICD-10-CM | POA: Diagnosis not present

## 2015-08-02 DIAGNOSIS — H532 Diplopia: Secondary | ICD-10-CM | POA: Insufficient documentation

## 2015-08-02 DIAGNOSIS — R1310 Dysphagia, unspecified: Secondary | ICD-10-CM | POA: Diagnosis not present

## 2015-08-02 DIAGNOSIS — T884XXA Failed or difficult intubation, initial encounter: Secondary | ICD-10-CM

## 2015-08-02 DIAGNOSIS — N390 Urinary tract infection, site not specified: Secondary | ICD-10-CM | POA: Diagnosis not present

## 2015-08-02 DIAGNOSIS — K219 Gastro-esophageal reflux disease without esophagitis: Secondary | ICD-10-CM | POA: Diagnosis present

## 2015-08-02 HISTORY — DX: Bacteremia: R78.81

## 2015-08-02 HISTORY — DX: Angioneurotic edema, initial encounter: T78.3XXA

## 2015-08-02 HISTORY — DX: Other cerebrovascular disease: I67.89

## 2015-08-02 LAB — COMPREHENSIVE METABOLIC PANEL
ALBUMIN: 3.4 g/dL — AB (ref 3.5–5.0)
ALK PHOS: 53 U/L (ref 38–126)
ALT: 13 U/L — AB (ref 17–63)
ANION GAP: 11 (ref 5–15)
AST: 18 U/L (ref 15–41)
BUN: 24 mg/dL — AB (ref 6–20)
CALCIUM: 9.3 mg/dL (ref 8.9–10.3)
CO2: 23 mmol/L (ref 22–32)
CREATININE: 0.91 mg/dL (ref 0.61–1.24)
Chloride: 101 mmol/L (ref 101–111)
GFR calc Af Amer: >60 mL/min (ref >60)
GFR calc non Af Amer: >60 mL/min (ref >60)
GLUCOSE: 104 mg/dL — AB (ref 65–99)
Potassium: 4.2 mmol/L (ref 3.5–5.1)
SODIUM: 135 mmol/L (ref 135–145)
Total Bilirubin: 0.6 mg/dL (ref 0.3–1.2)
Total Protein: 8.5 g/dL — ABNORMAL HIGH (ref 6.5–8.1)

## 2015-08-02 LAB — CBC WITH DIFFERENTIAL/PLATELET
BASOS ABS: 0 10*3/uL (ref 0.0–0.1)
Basophils Relative: 0 %
EOS PCT: 0 %
Eosinophils Absolute: 0 10*3/uL (ref 0.0–0.7)
HEMATOCRIT: 33.8 % — AB (ref 39.0–52.0)
HEMOGLOBIN: 11.4 g/dL — AB (ref 13.0–17.0)
Lymphocytes Relative: 10 %
Lymphs Abs: 1.3 10*3/uL (ref 0.7–4.0)
MCH: 28.3 pg (ref 26.0–34.0)
MCHC: 33.7 g/dL (ref 30.0–36.0)
MCV: 83.9 fL (ref 78.0–100.0)
MONO ABS: 1 10*3/uL (ref 0.1–1.0)
MONOS PCT: 8 %
NEUTROS ABS: 10.9 10*3/uL — AB (ref 1.7–7.7)
Neutrophils Relative %: 82 %
Platelets: 629 10*3/uL — ABNORMAL HIGH (ref 150–400)
RBC: 4.03 MIL/uL — ABNORMAL LOW (ref 4.22–5.81)
RDW: 16.2 % — AB (ref 11.5–15.5)
WBC: 13.2 10*3/uL — ABNORMAL HIGH (ref 4.0–10.5)

## 2015-08-02 LAB — I-STAT CG4 LACTIC ACID, ED
LACTIC ACID, VENOUS: 1.38 mmol/L (ref 0.5–2.0)
Lactic Acid, Venous: 1.32 mmol/L (ref 0.5–2.0)

## 2015-08-02 LAB — URINALYSIS, ROUTINE W REFLEX MICROSCOPIC
Bilirubin Urine: NEGATIVE
GLUCOSE, UA: NEGATIVE mg/dL
Hgb urine dipstick: NEGATIVE
KETONES UR: NEGATIVE mg/dL
LEUKOCYTES UA: NEGATIVE
Nitrite: NEGATIVE
PH: 6 (ref 5.0–8.0)
Protein, ur: NEGATIVE mg/dL
SPECIFIC GRAVITY, URINE: 1.029 (ref 1.005–1.030)

## 2015-08-02 LAB — VALPROIC ACID LEVEL: Valproic Acid Lvl: 25 ug/mL — ABNORMAL LOW (ref 50.0–100.0)

## 2015-08-02 MED ORDER — DIAZEPAM 5 MG PO TABS
5.0000 mg | ORAL_TABLET | Freq: Three times a day (TID) | ORAL | Status: DC | PRN
Start: 2015-08-02 — End: 2015-08-06
  Administered 2015-08-02 – 2015-08-06 (×4): 5 mg via ORAL
  Filled 2015-08-02 (×4): qty 1

## 2015-08-02 MED ORDER — VANCOMYCIN HCL IN DEXTROSE 1-5 GM/200ML-% IV SOLN
1000.0000 mg | Freq: Two times a day (BID) | INTRAVENOUS | Status: DC
  Administered 2015-08-03 – 2015-08-05 (×5): 1000 mg via INTRAVENOUS
  Filled 2015-08-02 (×7): qty 200

## 2015-08-02 MED ORDER — VANCOMYCIN HCL 10 G IV SOLR
1250.0000 mg | INTRAVENOUS | Status: AC
  Administered 2015-08-02: 1250 mg via INTRAVENOUS
  Filled 2015-08-02: qty 1250

## 2015-08-02 MED ORDER — VANCOMYCIN HCL IN DEXTROSE 1-5 GM/200ML-% IV SOLN: 1000.0000 mg | Freq: Once | INTRAVENOUS | Status: DC

## 2015-08-02 MED ORDER — DEXTROSE 5 % IV SOLN: 1.0000 g | Freq: Three times a day (TID) | INTRAVENOUS | Status: DC

## 2015-08-02 MED ORDER — METOPROLOL SUCCINATE ER 50 MG PO TB24
50.0000 mg | ORAL_TABLET | Freq: Every day | ORAL | Status: DC
  Administered 2015-08-03: 50 mg via ORAL
  Filled 2015-08-02: qty 1

## 2015-08-02 MED ORDER — PIPERACILLIN-TAZOBACTAM 3.375 G IVPB 30 MIN: 3.3750 g | Freq: Four times a day (QID) | INTRAVENOUS | Status: DC

## 2015-08-02 MED ORDER — DEXTROSE 5 % IV SOLN
2.0000 g | Freq: Three times a day (TID) | INTRAVENOUS | Status: DC
  Filled 2015-08-02: qty 2

## 2015-08-02 MED ORDER — PROMETHAZINE HCL 25 MG PO TABS: 25.0000 mg | ORAL_TABLET | Freq: Four times a day (QID) | ORAL | Status: DC | PRN

## 2015-08-02 MED ORDER — MONTELUKAST SODIUM 10 MG PO TABS
10.0000 mg | ORAL_TABLET | Freq: Every day | ORAL | Status: DC
  Administered 2015-08-02: 10 mg via ORAL
  Filled 2015-08-02: qty 1

## 2015-08-02 MED ORDER — FENTANYL CITRATE (PF) 100 MCG/2ML IJ SOLN
100.0000 ug | Freq: Once | INTRAMUSCULAR | Status: AC
  Administered 2015-08-02: 100 ug via INTRAVENOUS
  Filled 2015-08-02: qty 2

## 2015-08-02 MED ORDER — ACETAMINOPHEN 325 MG PO TABS
650.0000 mg | ORAL_TABLET | ORAL | Status: DC | PRN
  Administered 2015-08-06 – 2015-08-09 (×4): 650 mg via ORAL
  Filled 2015-08-02 (×4): qty 2

## 2015-08-02 MED ORDER — SIMVASTATIN 40 MG PO TABS
40.0000 mg | ORAL_TABLET | Freq: Every day | ORAL | Status: DC
  Administered 2015-08-02 – 2015-08-05 (×4): 40 mg via ORAL
  Filled 2015-08-02 (×4): qty 1

## 2015-08-02 MED ORDER — ADENOSINE 6 MG/2ML IV SOLN
6.0000 mg | Freq: Once | INTRAVENOUS | Status: AC
  Administered 2015-08-02: 6 mg via INTRAVENOUS
  Filled 2015-08-02: qty 2

## 2015-08-02 MED ORDER — BUDESONIDE 0.25 MG/2ML IN SUSP
0.5000 mg | Freq: Two times a day (BID) | RESPIRATORY_TRACT | Status: DC
  Administered 2015-08-03 – 2015-08-09 (×13): 0.5 mg via RESPIRATORY_TRACT
  Filled 2015-08-02 (×14): qty 4

## 2015-08-02 MED ORDER — DEXTROSE 5 % IV SOLN
2.0000 g | INTRAVENOUS | Status: AC
  Administered 2015-08-02: 2 g via INTRAVENOUS
  Filled 2015-08-02: qty 2

## 2015-08-02 MED ORDER — DIVALPROEX SODIUM ER 500 MG PO TB24
1000.0000 mg | ORAL_TABLET | Freq: Every day | ORAL | Status: DC
  Administered 2015-08-02: 1000 mg via ORAL
  Filled 2015-08-02: qty 2

## 2015-08-02 MED ORDER — TIZANIDINE HCL 4 MG PO TABS
4.0000 mg | ORAL_TABLET | Freq: Three times a day (TID) | ORAL | Status: DC | PRN
  Filled 2015-08-02 (×2): qty 1

## 2015-08-02 MED ORDER — DILTIAZEM HCL 100 MG IV SOLR
5.0000 mg/h | INTRAVENOUS | Status: DC
  Administered 2015-08-02: 5 mg/h via INTRAVENOUS
  Administered 2015-08-03: 15 mg/h via INTRAVENOUS
  Administered 2015-08-03: 5 mg/h via INTRAVENOUS
  Administered 2015-08-03: 7.5 mg/h via INTRAVENOUS
  Administered 2015-08-04: 15 mg/h via INTRAVENOUS
  Administered 2015-08-04: 10 mg/h via INTRAVENOUS
  Administered 2015-08-05 (×4): 15 mg/h via INTRAVENOUS
  Filled 2015-08-02 (×10): qty 100

## 2015-08-02 MED ORDER — DULOXETINE HCL 60 MG PO CPEP: 120.0000 mg | ORAL_CAPSULE | Freq: Every day | ORAL | Status: DC

## 2015-08-02 MED ORDER — DILTIAZEM HCL 100 MG IV SOLR: 5.0000 mg/h | INTRAVENOUS | Status: DC

## 2015-08-02 MED ORDER — PRIMIDONE 250 MG PO TABS
250.0000 mg | ORAL_TABLET | Freq: Two times a day (BID) | ORAL | Status: DC
  Administered 2015-08-02 – 2015-08-09 (×14): 250 mg via ORAL
  Filled 2015-08-02 (×16): qty 1

## 2015-08-02 MED ORDER — SODIUM CHLORIDE 0.9 % IV BOLUS (SEPSIS)
1000.0000 mL | Freq: Once | INTRAVENOUS | Status: AC
  Administered 2015-08-02: 1000 mL via INTRAVENOUS

## 2015-08-02 MED ORDER — MORPHINE SULFATE (PF) 4 MG/ML IV SOLN
4.0000 mg | INTRAVENOUS | Status: DC | PRN
  Administered 2015-08-02: 4 mg via INTRAVENOUS
  Filled 2015-08-02: qty 1

## 2015-08-02 MED ORDER — IPRATROPIUM-ALBUTEROL 0.5-2.5 (3) MG/3ML IN SOLN
3.0000 mL | Freq: Four times a day (QID) | RESPIRATORY_TRACT | Status: DC
  Administered 2015-08-03: 3 mL via RESPIRATORY_TRACT
  Filled 2015-08-02 (×2): qty 3

## 2015-08-02 MED ORDER — DILTIAZEM LOAD VIA INFUSION
5.0000 mg | Freq: Once | INTRAVENOUS | Status: AC
  Administered 2015-08-02: 5 mg via INTRAVENOUS
  Filled 2015-08-02: qty 5

## 2015-08-02 MED ORDER — SODIUM CHLORIDE 0.9 % IV SOLN
1000.0000 mL | INTRAVENOUS | Status: DC
  Administered 2015-08-02 – 2015-08-03 (×2): 1000 mL via INTRAVENOUS

## 2015-08-02 MED ORDER — LOSARTAN POTASSIUM 50 MG PO TABS
50.0000 mg | ORAL_TABLET | Freq: Every day | ORAL | Status: DC
  Administered 2015-08-02: 50 mg via ORAL
  Filled 2015-08-02: qty 1

## 2015-08-02 MED ORDER — ARIPIPRAZOLE 5 MG PO TABS
5.0000 mg | ORAL_TABLET | Freq: Every day | ORAL | Status: DC
  Filled 2015-08-02: qty 1

## 2015-08-02 MED ORDER — PIPERACILLIN-TAZOBACTAM 3.375 G IVPB
3.3750 g | Freq: Three times a day (TID) | INTRAVENOUS | Status: DC
  Administered 2015-08-03 – 2015-08-07 (×14): 3.375 g via INTRAVENOUS
  Filled 2015-08-02 (×15): qty 50

## 2015-08-02 MED ORDER — SODIUM CHLORIDE 0.9 % IV BOLUS (SEPSIS)
500.0000 mL | Freq: Once | INTRAVENOUS | Status: AC
  Administered 2015-08-02: 500 mL via INTRAVENOUS

## 2015-08-02 MED ORDER — FAMOTIDINE IN NACL 20-0.9 MG/50ML-% IV SOLN
20.0000 mg | Freq: Two times a day (BID) | INTRAVENOUS | Status: DC
  Administered 2015-08-02 – 2015-08-03 (×2): 20 mg via INTRAVENOUS
  Filled 2015-08-02 (×2): qty 50

## 2015-08-02 MED ORDER — ONDANSETRON HCL 4 MG/2ML IJ SOLN
4.0000 mg | Freq: Four times a day (QID) | INTRAMUSCULAR | Status: DC | PRN
  Administered 2015-08-09: 4 mg via INTRAVENOUS
  Filled 2015-08-02: qty 2

## 2015-08-02 MED ORDER — DOCUSATE SODIUM 100 MG PO CAPS
200.0000 mg | ORAL_CAPSULE | Freq: Every day | ORAL | Status: DC
  Administered 2015-08-02: 200 mg via ORAL
  Filled 2015-08-02 (×2): qty 2

## 2015-08-02 MED ORDER — POLYETHYLENE GLYCOL 3350 17 G PO PACK: 17.0000 g | PACK | Freq: Three times a day (TID) | ORAL | Status: DC | PRN

## 2015-08-02 NOTE — ED Notes (Signed)
Pt stated he would like to try and give a urine sample on his own

## 2015-08-02 NOTE — H&P (Signed)
History and Physical    Malik Zuniga HMC:[id number] DOB: 07/07/1945 DOA: 08/02/2015  PCP: [name], MD Patient coming from: pulmonology office  Consultants: Pulm - [name]; GI - [place]; [place]; [place]; Urology - ?; Neurology - [place]  Chief Complaint: pulmonary f/u for recent hospitalization - sent to ER  HPI: Malik Zuniga is a 70 y.o. male with medical history significant of prostate (remote) and recent lung cancer; recent hospitalization and recurrent hospitalizations for aspiration PNA.  He was hospitalized 1 month ago for a week for aspiration pneumonia.  Was seen today for routine hospital f/u with pulm today and sent to [facility] ER.  Wife reports that he started getting sick last Monday night and has gotten progressively worse.  Started with generalized weakness, generalized pain, decreased PO intake, and cough.  +hemoptysis, started last night (no history of prior).  Has had persistent PNA since October, per wife report. This AM, he was very disoriented for hours - audiovisual hallucinations, "just talking nonsense".  Wife put O2 on him and within about 90 minutes this improved (usually only uses O2 at night).  Has taken Ensure but minimal food over the last few days.  She has worked hard on giving him only a soft mechanical diet since last hospitalization.  No fevers (99 in ER).  Very sweaty today, +chills yesterday.  Cough was productive of brown sputum yesterday.  +SOB, more than usual.  +nasal congestion.  No CP/palpitations.  Has rash on his chest, wife uncertain when that developed.  He did start Augmentin last night after an episode of SOB yesterday (PCM called in and planned to see him today).  Very, very weak - much more difficulty with mobility.  Increased back pain since last fall.    ED Course: Given adenosine to clarify tachycardia vs. Aflutter (revealed flutter waves); Cardizem, titrated up to 15; 30cc/kg bolus; given Cefepime and Vanc; No anticoagulation due to  acute hemoptysis  Review of Systems: As per HPI otherwise 10 point review of systems reviewed and negative.   Ambulatory Status: Previously used cane in house and wheelchair out of home but has been using a walker since last hospitalization; had a bad fall with walker recently (June 1) and x-rays negative for fracture  Past Medical History  Diagnosis Date  . Hyperlipidemia     takes Simvastatin daily  . Tremor   . History of prostate cancer 2004  . On home O2   . Neuromuscular disorder ([place]) 1998    right carpal tunnel release  . Anemia associated with acute blood loss   . GERD (gastroesophageal reflux disease)     takes Omeprazole daily  . Hypertension     takes Metoprolol daily as well as Hyzaar  . Constipation     takes Colace daily as well as Miralax  . Anxiety     takes Valium daily  . Depression     takes Cymbalta daily  . Emphysema of lung (HCC)     Albuterol as needed;Symbicort daily and Singulair at bedtime  . Myocardial infarction ([place]) 04/1998  . Coronary artery disease   . Asthma   . Shortness of breath     with exertion  . History of bronchitis   . Aspiration pneumonia ([place]) 2010  . Headache, chronic daily   . History of migraine     last migraine a couple of days ago;takes Excedrin Migraine  . Chronic back pain     compression fracture  .  History of colon polyps   . Mood change ([place])     after Brain surgery mood changed and was placed on Depakote  . Insomnia     takes Benadryl nightly  . 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 Lung cancer ([place]) 2016    Past Surgical History  Procedure Laterality Date  . Cholecystectomy  1996  . 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 Annette Stable  . Vascular stent  2000    Dr. Rollene Fare; he reports cardiac stent, but denies stent for PAD 06/15/13  . Hand surgery  1989    crushed thumb, Dr. Fredna Dow  . Ulnar nerve repair  1998    left arm, Dr. Fredna Dow  . Gastrostomy w/ feeding tube  2008    Dr. Watt Climes; only had for a few  months  . Prostatectomy  04/2001    removal of prostate cancer, Dr. Janice Norrie  . Coronary angioplasty with stent placement  04/1998  . Carpal tunnel release  1998    right  . Spine surgery    . 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 Back surgery  08/2004; 02/2005; 04/2006; 06/2007; 7/20101    all by Dr. Annette Stable  . Esophagogastroduodenoscopy N/A 07/23/2012    Procedure: ESOPHAGOGASTRODUODENOSCOPY (EGD);  Surgeon: Jeryl Columbia, MD;  Location: [facility] ENDOSCOPY;  Service: Endoscopy;  Laterality: N/A;  buccini /ja  . Colonoscopy    . Back surgery  05/2013  . Dg biopsy lung      Social History   Social History  . Marital Status: Married    Spouse Name: N/A  . Number of Children: N/A  . Years of Education: N/A   Occupational History  . Not on file.   Social History Main Topics  . Smoking status: Former Smoker -- 1.50 packs/day for 52 years    Types: Cigarettes    Quit date: 07/17/2012  . Smokeless tobacco: Never Used     Comment: Former smoker  . Alcohol Use: No  . Drug Use: Yes    Special: [place]  . Sexual Activity: Yes   Other Topics Concern  . Not on file   Social History Narrative    Allergies  Allergen Reactions  . Lisinopril Swelling    ANGIOEDEMA  . Lamictal [Lamotrigine] Other (See Comments)    Weakness/difficulty swallowing  . Ambien [Zolpidem] Other (See Comments)    Unknown reaction    Family History  Problem Relation Age of Onset  . Heart disease Mother   . Cancer Father     brain cancer and prostate cancer     Prior to Admission medications   Medication Sig Start Date End Date Taking? Authorizing Provider  amoxicillin-clavulanate (AUGMENTIN) 875-125 MG tablet Take 1 tablet by mouth 2 (two) times daily. 08/01/15  Yes [name], MD  ARIPiprazole (ABILIFY) 10 MG tablet Take 5 mg by mouth daily.  05/09/14  Yes Historical Provider, MD  Ascorbic Acid (VITAMIN C) 1000 MG tablet Take 1,000 mg by mouth daily.   Yes Historical Provider, MD    aspirin-acetaminophen-caffeine (EXCEDRIN MIGRAINE) [phone] MG per tablet Take 2 tablets by mouth every 6 (six) hours as needed. Reported on 07/30/2015   Yes Historical Provider, MD  budesonide (PULMICORT) 0.25 MG/2ML nebulizer solution Take 4 mLs (0.5 mg total) by nebulization 2 (two) times daily. 12/07/13  Yes Kathee Delton, MD  cholecalciferol (VITAMIN D) 1000 UNITS tablet Take 1,000 Units by mouth every morning.  Yes Historical Provider, MD  diazepam (VALIUM) 5 MG tablet Take 1 tablet (5 mg total) by mouth every 8 (eight) hours as needed for anxiety or muscle spasms. scheduled 06/29/15  Yes Donita Brooks, NP  divalproex (DEPAKOTE) 500 MG 24 hr tablet Take 1,000 mg by mouth at bedtime.    Yes Historical Provider, MD  docusate sodium (COLACE) 100 MG capsule Take 200 mg by mouth at bedtime.    Yes Historical Provider, MD  DULoxetine (CYMBALTA) 60 MG capsule Take 120 mg by mouth daily.    Yes Historical Provider, MD  fluticasone (FLONASE) 50 MCG/ACT nasal spray PLACE 2 SPRAYS INTO BOTH NOSTRILS DAILY. Patient taking differently: PLACE 2 SPRAYS INTO BOTH NOSTRILS DAILY as needed for allergies 10/30/14  Yes [name], MD  HYDROcodone-acetaminophen (NORCO) 10-325 MG per tablet Take 1 tablet by mouth every 6 (six) hours as needed for moderate pain. for pain 04/07/14  Yes Historical Provider, MD  ipratropium-albuterol (DUONEB) 0.5-2.5 (3) MG/3ML SOLN Take 3 mLs by nebulization every 6 (six) hours. 12/07/13  Yes Kathee Delton, MD  losartan (COZAAR) 50 MG tablet Take 1 tablet (50 mg total) by mouth daily. Patient taking differently: Take 50 mg by mouth at bedtime.  06/06/15  Yes [name], MD  metoprolol succinate (TOPROL-XL) 50 MG 24 hr tablet TAKE 1 TABLET (50 MG TOTAL) BY MOUTH EVERY MORNING. TAKE WITH OR IMMEDIATELY FOLLOWING A MEAL. 06/14/15  Yes [name], MD  montelukast (SINGULAIR) 10 MG tablet Take 1 tablet (10 mg total) by mouth at bedtime. 06/14/15  Yes [name], MD  NITROSTAT 0.4 MG SL tablet PLACE 1 TABLET (0.4 MG TOTAL) UNDER THE TONGUE EVERY 5 (FIVE) MINUTES AS NEEDED FOR CHEST PAIN. 08/10/14  Yes Belva Crome, MD  omeprazole (PRILOSEC) 40 MG capsule Take 40 mg by mouth 2 (two) times daily.    Yes Historical Provider, MD  polyethylene glycol (MIRALAX / GLYCOLAX) packet Take 17 g by mouth 3 (three) times daily as needed for moderate constipation. For constipation 07/24/12  Yes Delfina Redwood, MD  primidone (MYSOLINE) 250 MG tablet TAKE 1 TABLET (250 MG TOTAL) BY MOUTH 2 (TWO) TIMES DAILY. 06/14/15  Yes [name], MD  promethazine (PHENERGAN) 25 MG tablet Take 25 mg by mouth every 6 (six) hours as needed for nausea or vomiting. Reported on 04/06/2015 08/10/14  Yes Historical Provider, MD  ranitidine (ZANTAC) 150 MG tablet Take 150-300 mg by mouth 2 (two) times daily as needed for heartburn.   Yes Historical Provider, MD  simvastatin (ZOCOR) 40 MG tablet TAKE 1 TABLET (40 MG TOTAL) BY MOUTH EVERY EVENING. 06/15/15  Yes Belva Crome, MD  tiZANidine (ZANAFLEX) 4 MG tablet TAKE 1 TABLET BY MOUTH EVERY 8 HOURS AS NEEDED FOR MUSCLE SPASMS. 01/09/15  Yes [name], MD  Zinc 50 MG TABS Take 50 mg by mouth daily.    Yes Historical Provider, MD  Respiratory Therapy Supplies (FLUTTER) DEVI Use as directed. Patient not taking: Reported on 08/02/2015 06/21/15   [name], MD    Physical Exam: Filed Vitals:   08/02/15 2000 08/02/15 2030 08/02/15 2036 08/02/15 2100  BP: 147/92 164/92 164/92 130/86  Pulse:  144 153 152  Temp:      TempSrc:      Resp: '18 28 26 26  '$ Height:      Weight:      SpO2:  90% 93% 91%     General: Ill-appearing -  does answer questions appropriately but defers to wife and generally lethargic and pale Eyes: PERRL, EOMI, normal lids, iris ENT:  grossly normal hearing, lips & tongue, mmm Neck:  no LAD, masses or thyromegaly Cardiovascular: Tachycardia to 155 despite Cardizem 15.  No LE edema.  Respiratory:  Increased respiratory effort with mild tachypnea, LLL ronchi, scattered wheezes Abdomen:  soft, diffusely tender to palpation, nd, NABS Skin:  no rash or induration seen on limited exam Musculoskeletal: mild-moderate generalized weakness Psychiatric: blunted affect, speech fluent and appropriate, AOx3 Neurologic:  CN 2-12 grossly intact, moves all extremities in coordinated fashion, sensation intact  Labs on Admission: I have personally reviewed following labs and imaging studies  CBC:  Recent Labs Lab 08/02/15 1744  WBC 13.2*  NEUTROABS 10.9*  HGB 11.4*  HCT 33.8*  MCV 83.9  PLT 161*   Basic Metabolic Panel:  Recent Labs Lab 08/02/15 1744  NA 135  K 4.2  CL 101  CO2 23  GLUCOSE 104*  BUN 24*  CREATININE 0.91  CALCIUM 9.3   GFR: Estimated Creatinine Clearance: 78 mL/min (by C-G formula based on Cr of 0.91). Liver Function Tests:  Recent Labs Lab 08/02/15 1744  AST 18  ALT 13*  ALKPHOS 53  BILITOT 0.6  PROT 8.5*  ALBUMIN 3.4*   No results for input(s): LIPASE, AMYLASE in the last 168 hours. No results for input(s): AMMONIA in the last 168 hours. Coagulation Profile: No results for input(s): INR, PROTIME in the last 168 hours. Cardiac Enzymes: No results for input(s): CKTOTAL, CKMB, CKMBINDEX, TROPONINI in the last 168 hours. BNP (last 3 results) No results for input(s): PROBNP in the last 8760 hours. HbA1C: No results for input(s): HGBA1C in the last 72 hours. CBG: No results for input(s): GLUCAP in the last 168 hours. Lipid Profile: No results for input(s): CHOL, HDL, LDLCALC, TRIG, CHOLHDL, LDLDIRECT in the last 72 hours. Thyroid Function Tests: No results for input(s): TSH, T4TOTAL, FREET4, T3FREE, THYROIDAB in the last 72 hours. Anemia Panel: No results for input(s): VITAMINB12, FOLATE, FERRITIN, TIBC, IRON, RETICCTPCT in the last 72 hours. Urine analysis:    Component Value Date/Time   COLORURINE YELLOW 08/02/2015 2007   APPEARANCEUR CLEAR  08/02/2015 2007   LABSPEC 1.029 08/02/2015 2007   PHURINE 6.0 08/02/2015 2007   GLUCOSEU NEGATIVE 08/02/2015 2007   HGBUR NEGATIVE 08/02/2015 2007   BILIRUBINUR NEGATIVE 08/02/2015 2007   KETONESUR NEGATIVE 08/02/2015 2007   PROTEINUR NEGATIVE 08/02/2015 2007   UROBILINOGEN 0.2 10/23/2013 1127   NITRITE NEGATIVE 08/02/2015 2007   LEUKOCYTESUR NEGATIVE 08/02/2015 2007    Creatinine Clearance: Estimated Creatinine Clearance: 78 mL/min (by C-G formula based on Cr of 0.91).  Sepsis Labs: '@LABRCNTIP'$ (procalcitonin:4,lacticidven:4) )No results found for this or any previous visit (from the past 240 hour(s)).   Radiological Exams on Admission: Dg Chest Portable 1 View  08/02/2015  CLINICAL DATA:  Possible sepsis. History of hypertension, emphysema, MI, lung cancer, bronchitis, pneumonia. History of CAD, asthma, and shortness of breath. EXAM: PORTABLE CHEST 1 VIEW COMPARISON:  07/16/2015 FINDINGS: Patient is rotated towards the left. Heart size is normal. There is mild streaky density at the right lung base, stable over prior exams and consistent with atelectasis or scarring. No focal consolidations or pleural effusions. No pulmonary edema. Spinal fixation rods again noted. IMPRESSION: No evidence for acute cardiopulmonary abnormality. Electronically Signed   By: Nolon Nations M.D.   On: 08/02/2015 17:46    EKG: Independently reviewed. Atrial flutter, rate 144, nonspecific ST changes, borderline  prolonged QT  Assessment/Plan Active Problems:   Atrial flutter with rapid ventricular response (HCC)   Bipolar disorder (HCC)   Essential hypertension   HCAP (healthcare-associated pneumonia)   Recurrent aspiration pneumonia ([place])   Primary cancer of right lower lobe of lung (HCC)   Sepsis ([place])   Aspiration pneumonia ([place])   Neuro/Psych:  Patient with underlying bipolar depression, had audiovisual hallucinations last night which improved with O2 administration.  Encepaholpathy resolved but  may be indicative or sepsis or may be related to underlying hypoxia.  Currently with blunted affect and decreased interaction/lethargy.  Will follow.  CV:  New-onset atrial flutter, not effectively rate controlled on max-dose Cardizem.  Likely to need additional agent(s) - on home beta blocker but may benefit from IV BB and/or may need IV Amiodarone.  Regardless, given concern for this issue and lack of cardiology support at this facility, it was discussed with cardiology (Dr. Posey Pronto), who recommended transfer to [facility] for consultation tonight (call Dr. Posey Pronto when patient arrives on floor).  Will admit to SDU for ongoing close monitoring.  No anticoagulation at this time despite CHADS2Vasc score pf 5 (high risk, 6.7% annual thromboembolism rate) due to active hemoptysis.  He did have a recent low-risk nuclear stress test with EF estimated to be 48%.  Pulm:  Seen by pulm in clinic today and already consulted on this evening.  Current concern is sepsis associated with HCAP from recurrent aspiration PNA.  Lactate was 1.32 at 1755, which is reassuring.  However, patient with tachycardia and tachypnea and so does demonstrate 2 SIRS criteria.  qSOFA score is either 1 (tachypnea) or 2 (with AMS if last night's confusion is considered) and so he is possibly high-risk for increased mortality.   Will admit to SDU as above.  [place] O2 for now, although patient may eventually require BIPAP and/or intubation if his clinical condition deteriorates.  Was given Cefepime and Vanc in ER but will change to Vanc/Zosyn for broad-spectrum coverage due to patient's high aspiration risk.  Sputum culture pending.  Hemoptysis thought to be related to acute infection.  Will continue Pulmicort and Duonebs (home meds).  GI:  Diffuse tenderness to palpation of uncertain etiology.  No acute abdomen but would recommend monitoring/further evaluation if new issues arise.  GU:  May require foley.  Renal:  Normal creatinine at this time and so does  not require renal adjustment of medications.  FEN:  Moist mucus membranes, NPO for now but hold IVF due to risk of volume overload given RVR.  Endo:  No DM, glucose 104 at admission.  Heme/Onc:  Mild anemia, + hemoptysis.  Will follow.  ID:  As above.  Etiology of sepsis thought to be aspiration PNA/HCAP (although acute exacerbation of bronchiectasis is also possible).  Broad spectrum therapy with Vanc (pharmacy to dose) and Zosyn for now.  MSK:  Chronic back pain - will treat with morphine.  Derm:  Rash not appreciated at time of presentation.        DVT prophylaxis: SCDs (no Lovenox for now due to hemoptysis) Code Status: Full - confirmed with patient/wife  Family Communication: Wife present throughout H&P and provided most of history; she is in agreement with plan  Disposition Plan: Admit to [facility] SDU Consults called: Pulmonary, Cardiology  Admission status: Admission    Karmen Bongo MD [facility]  If 7PM-7AM, please contact night-coverage [email or web address] Password [facility]  08/02/2015, 9:21 PM

## 2015-08-02 NOTE — Assessment & Plan Note (Signed)
He has bronchiectasis on his CT scan of the chest in the right lower lobe.  Based on his current symptoms he is likely having an exacerbation of his bronchiectasis. With his neuromuscular weakness and recurrent exacerbations he needs assistance with mucociliary clearance. He has failed flutter valve therapy considering his current situation.  Plan: Prescribed therapy vest in hospital twice a day Prescribed therapy vest to be used at home twice a day for mucociliary clearance for his bronchiectasis

## 2015-08-02 NOTE — Consult Note (Signed)
Name: Malik Zuniga MRN: [id number] DOB: [birth year]    ADMISSION DATE:  08/02/2015 CONSULTATION DATE:  08/02/2015  REFERRING MD :  [name], EDP  CHIEF COMPLAINT:  Sent from the clinic for confusion, tachycardia   HISTORY OF PRESENT ILLNESS:  70 year old man with COPD, recurrent aspiration and bronchiectasis in the right lower lobe. He was formally followed by Dr. [name] and is now followed by Dr. [name] in the pulmonary clinic.  He was sent in for the pulmonary clinic today where he was noted to be confused and tachycardic. He reports 4 days of coughing up.brown mucus and hemoptysis of blood tinged sputum appearing pink or the last 24 hours multiple episodes. This history is confirmed by his wife. He reports feeling warm and sweats but no fever was recorded. He was prescribed Augmentin which she took he has taken this in the past when he needed prolonged antibiotics. He has tried to be compliant with a pured diet  In the ED was noted to have a lactate of 1.3 and a white count of 13.2 chest x-ray showed no clear infiltrates and a mild right lower lobe platelike atelectasis He was given cefepime and vancomycin and started on Cardizem for atrial fibrillation and RVR    He was treated with SB RT for right lower lobe adenocarcinoma in 2016 with decrease in the size of the nodule and a follow-up scan in 04/2015 He was admitted multiple times in 2017 for recurrent aspiration pneumonia and last scan from 04/2015 shows improved left lower lobe infiltrate.  His spirometry from 04/2009 shows FEV1 of 1.65-47% with a ratio 49 suggesting stage III COPD  SIGNIFICANT EVENTS    STUDIES:  04/2015 CT chest - Decrease in size of a right lower lobe adenocarcinoma with changes of radiation therapy in the adjacent right lung. Improving left lower lobe consolidation with a residual small left pleural effusion.    PAST MEDICAL HISTORY :   has a past medical history of Hyperlipidemia; Tremor; History of  prostate cancer; On home O2; Neuromuscular disorder ([place]) (1998); Anemia associated with acute blood loss; GERD (gastroesophageal reflux disease); Hypertension; Constipation; Anxiety; Depression; Emphysema of lung ([place]); Emphysema; Myocardial infarction ([place]) (04/1998); Coronary artery disease; Asthma; Shortness of breath; History of bronchitis; Aspiration pneumonia ([place]) (2010); Headache, chronic daily; History of migraine; Chronic back pain; History of colon polyps; Mood change ([place]); Insomnia; Stroke ([place]) (1998); Prostate cancer ([place]) (2004); and Lung cancer ([place]).  has past surgical history that includes Cholecystectomy (1996); Craniotomy (1999); vascular stent (2000); Hand surgery (1989); Ulnar nerve repair (1998); Gastrostomy w/ feeding tube (2008); Prostatectomy (04/2001); Coronary angioplasty with stent (04/1998); Carpal tunnel release (1998); Spine surgery; Brain surgery (1999); Back surgery (08/2004; 02/2005; 04/2006; 06/2007; 7/20101); Esophagogastroduodenoscopy (N/A, 07/23/2012); Colonoscopy; Back surgery (05/2013); and DG BIOPSY LUNG. Prior to Admission medications   Medication Sig Start Date End Date Taking? Authorizing Provider  amoxicillin-clavulanate (AUGMENTIN) 875-125 MG tablet Take 1 tablet by mouth 2 (two) times daily. 08/01/15  Yes [name], MD  ARIPiprazole (ABILIFY) 10 MG tablet Take 5 mg by mouth daily.  05/09/14  Yes Historical Provider, MD  Ascorbic Acid (VITAMIN C) 1000 MG tablet Take 1,000 mg by mouth daily.   Yes Historical Provider, MD  aspirin-acetaminophen-caffeine (EXCEDRIN MIGRAINE) [phone] MG per tablet Take 2 tablets by mouth every 6 (six) hours as needed. Reported on 07/30/2015   Yes Historical Provider, MD  budesonide (PULMICORT) 0.25 MG/2ML nebulizer solution Take 4 mLs (0.5 mg total) by nebulization 2 (two) times  daily. 12/07/13  Yes Kathee Delton, MD  cholecalciferol (VITAMIN D) 1000 UNITS tablet Take 1,000 Units by mouth every morning.    Yes Historical Provider,  MD  diazepam (VALIUM) 5 MG tablet Take 1 tablet (5 mg total) by mouth every 8 (eight) hours as needed for anxiety or muscle spasms. scheduled 06/29/15  Yes Donita Brooks, NP  divalproex (DEPAKOTE) 500 MG 24 hr tablet Take 1,000 mg by mouth at bedtime.    Yes Historical Provider, MD  docusate sodium (COLACE) 100 MG capsule Take 200 mg by mouth at bedtime.    Yes Historical Provider, MD  DULoxetine (CYMBALTA) 60 MG capsule Take 120 mg by mouth daily.    Yes Historical Provider, MD  fluticasone (FLONASE) 50 MCG/ACT nasal spray PLACE 2 SPRAYS INTO BOTH NOSTRILS DAILY. Patient taking differently: PLACE 2 SPRAYS INTO BOTH NOSTRILS DAILY as needed for allergies 10/30/14  Yes [name], MD  HYDROcodone-acetaminophen (NORCO) 10-325 MG per tablet Take 1 tablet by mouth every 6 (six) hours as needed for moderate pain. for pain 04/07/14  Yes Historical Provider, MD  ipratropium-albuterol (DUONEB) 0.5-2.5 (3) MG/3ML SOLN Take 3 mLs by nebulization every 6 (six) hours. 12/07/13  Yes Kathee Delton, MD  losartan (COZAAR) 50 MG tablet Take 1 tablet (50 mg total) by mouth daily. Patient taking differently: Take 50 mg by mouth at bedtime.  06/06/15  Yes [name], MD  metoprolol succinate (TOPROL-XL) 50 MG 24 hr tablet TAKE 1 TABLET (50 MG TOTAL) BY MOUTH EVERY MORNING. TAKE WITH OR IMMEDIATELY FOLLOWING A MEAL. 06/14/15  Yes [name], MD  montelukast (SINGULAIR) 10 MG tablet Take 1 tablet (10 mg total) by mouth at bedtime. 06/14/15  Yes [name], MD  NITROSTAT 0.4 MG SL tablet PLACE 1 TABLET (0.4 MG TOTAL) UNDER THE TONGUE EVERY 5 (FIVE) MINUTES AS NEEDED FOR CHEST PAIN. 08/10/14  Yes Belva Crome, MD  omeprazole (PRILOSEC) 40 MG capsule Take 40 mg by mouth 2 (two) times daily.    Yes Historical Provider, MD  polyethylene glycol (MIRALAX / GLYCOLAX) packet Take 17 g by mouth 3 (three) times daily as needed for moderate constipation. For constipation 07/24/12  Yes Delfina Redwood, MD    primidone (MYSOLINE) 250 MG tablet TAKE 1 TABLET (250 MG TOTAL) BY MOUTH 2 (TWO) TIMES DAILY. 06/14/15  Yes [name], MD  promethazine (PHENERGAN) 25 MG tablet Take 25 mg by mouth every 6 (six) hours as needed for nausea or vomiting. Reported on 04/06/2015 08/10/14  Yes Historical Provider, MD  ranitidine (ZANTAC) 150 MG tablet Take 150-300 mg by mouth 2 (two) times daily as needed for heartburn.   Yes Historical Provider, MD  simvastatin (ZOCOR) 40 MG tablet TAKE 1 TABLET (40 MG TOTAL) BY MOUTH EVERY EVENING. 06/15/15  Yes Belva Crome, MD  tiZANidine (ZANAFLEX) 4 MG tablet TAKE 1 TABLET BY MOUTH EVERY 8 HOURS AS NEEDED FOR MUSCLE SPASMS. 01/09/15  Yes [name], MD  Zinc 50 MG TABS Take 50 mg by mouth daily.    Yes Historical Provider, MD  Respiratory Therapy Supplies (FLUTTER) DEVI Use as directed. Patient not taking: Reported on 08/02/2015 06/21/15   [name], MD   Allergies  Allergen Reactions  . Lisinopril Swelling    ANGIOEDEMA  . Lamictal [Lamotrigine] Other (See Comments)    Weakness/difficulty swallowing  . Ambien [Zolpidem] Other (See Comments)    Unknown reaction    FAMILY HISTORY:  family  history includes Cancer in his father; Heart disease in his mother. SOCIAL HISTORY:  reports that he quit smoking about 3 years ago. His smoking use included Cigarettes. He has a 78 pack-year smoking history. He has never used smokeless tobacco. He reports that he uses illicit drugs ([place]). He reports that he does not drink alcohol.  REVIEW OF SYSTEMS:   Constitutional: positive  for fever, chills, weight loss, malaise/fatigue and diaphoresis.  HENT: Negative for hearing loss, ear pain, nosebleeds, congestion, sore throat, neck pain, tinnitus and ear discharge.   Eyes: Negative for blurred vision, double vision, photophobia, pain, discharge and redness.  Respiratory:positive  for cough, hemoptysis, sputum production, shortness of breath, wheezing  Cardiovascular:  Negative for chest pain, palpitations, orthopnea, claudication, leg swelling and PND.  Gastrointestinal: Negative for heartburn, nausea, vomiting, abdominal pain, diarrhea, constipation, blood in stool and melena.  Genitourinary: Negative for dysuria, urgency, frequency, hematuria and flank pain.  Musculoskeletal: Negative for myalgias, back pain, joint pain and falls.  Skin: Negative for itching , pos rash.  Neurological: Negative for dizziness, tingling, tremors, sensory change, speech change, focal weakness, seizures, loss of consciousness, weakness and headaches.  Endo/Heme/Allergies: Negative for environmental allergies and polydipsia. Does not bruise/bleed easily.  SUBJECTIVE:   VITAL SIGNS: Temp:  [97.8 F (36.6 C)-99.9 F (37.7 C)] 98.6 F (37 C) (06/22 1914) Pulse Rate:  [76-153] 153 (06/22 1930) Resp:  [14-30] 18 (06/22 2000) BP: (106-153)/(70-106) 147/92 mmHg (06/22 2000) SpO2:  [88 %-94 %] 88 % (06/22 1930) Weight:  [164 lb (74.39 kg)] 164 lb (74.39 kg) (06/22 1725)  PHYSICAL EXAMINATION: General:  Chronically ill-appearing, elderly man, able to talk in full sentences Neuro:  Awake, alert and oriented, nonfocal  HEENT:  No JVD, carotid bruits Cardiovascular:  S1 and S2 tachycardia and irregular, no murmur Lungs:  Decreased breath sounds bilateral, no wheezing Abdomen:  Soft and nontender Musculoskeletal:  Good pulses, no edema Skin:  Capillary refill less than 2 seconds, erythematous rash over sternum   Recent Labs Lab 08/02/15 1744  NA 135  K 4.2  CL 101  CO2 23  BUN 24*  CREATININE 0.91  GLUCOSE 104*    Recent Labs Lab 08/02/15 1744  HGB 11.4*  HCT 33.8*  WBC 13.2*  PLT 629*   Dg Chest Portable 1 View  08/02/2015  CLINICAL DATA:  Possible sepsis. History of hypertension, emphysema, MI, lung cancer, bronchitis, pneumonia. History of CAD, asthma, and shortness of breath. EXAM: PORTABLE CHEST 1 VIEW COMPARISON:  07/16/2015 FINDINGS: Patient is rotated  towards the left. Heart size is normal. There is mild streaky density at the right lung base, stable over prior exams and consistent with atelectasis or scarring. No focal consolidations or pleural effusions. No pulmonary edema. Spinal fixation rods again noted. IMPRESSION: No evidence for acute cardiopulmonary abnormality. Electronically Signed   By: Nolon Nations M.D.   On: 08/02/2015 17:46    ASSESSMENT / PLAN:  Acute exacerbation of bronchiectasis versus another aspiration episode -Agree with oxygen by nasal cannula, broad-spectrum antibiotics -Obtain sputum culture and tailor antibiotics accordingly  Hemoptysis, likely related to above - should improve with antibiotics, if worsens, consider adding antitussives containing codeine -right now hold off given that his confusion is mildly improved  COPD-no evidence of exacerbation -Continue Pulmicort and DuoNeb nebs -He quit smoking in 2014  Acute encephalopathy -seems to have improved with antibiotics and hydration , very likely related to sepsis, observe for now  Atrial fibrillation/RVR  - new onset, although he states  that Dr. Tamala Julian has noted this in the past  - he is not a candidate for anti-coag ablation currently due to hemoptysis , can consider once this subsides within the next 24 hours  - continue Cardizem drip  - consider rhythm control with amiodarone if Cardizem not effective  - recent nuclear stress test was a low-risk study with EF of 48%   [facility] M will consult  Kara Mead MD. FCCP. [facility] Pager [phone] If no response call [phone]    08/02/2015, 8:18 PM

## 2015-08-02 NOTE — ED Notes (Signed)
Bed: RESB Expected date:  Expected time:  Means of arrival:  Comments: Pulmonology - EMS - Septic - HR 138

## 2015-08-02 NOTE — Progress Notes (Signed)
Pharmacy Antibiotic Note  Malik Zuniga is a 70 y.o. male admitted on 08/02/2015 with pneumonia.  Pharmacy has been consulted for vancomycin and cefepime dosing.  Plan:  Vancomycin '1250mg'$  IV x 1, then 1g IV q12h, goal trough 15-20 mcg/ml Check trough at steady state Cefepime 2g IV q8h - high dose given history of Pseudomonas in 2013.  Will consider dose decrease if cultures remain negative with clinical improvement.  Height: '5\' 10"'$  (177.8 cm) Weight: 164 lb (74.39 kg) IBW/kg (Calculated) : 73  Temp (24hrs), Avg:98.8 F (37.1 C), Min:97.8 F (36.6 C), Max:99.9 F (37.7 C)   Recent Labs Lab 08/02/15 1744 08/02/15 1755  WBC 13.2*  --   CREATININE 0.91  --   LATICACIDVEN  --  1.32    Estimated Creatinine Clearance: 78 mL/min (by C-G formula based on Cr of 0.91).    Allergies  Allergen Reactions  . Lisinopril Swelling    ANGIOEDEMA  . Lamictal [Lamotrigine] Other (See Comments)    Weakness/difficulty swallowing  . Ambien [Zolpidem] Other (See Comments)    Unknown reaction    Antimicrobials this admission: 6/22 Vanc >> 6/22 Cefepime >>  Dose adjustments this admission: ---  Microbiology results: 6/22 BCx: sent 6/22 UCx: ordered   Thank you for allowing pharmacy to be a part of this patient's care.  Peggyann Juba, PharmD, BCPS Pager: [phone] 08/02/2015 6:36 PM

## 2015-08-02 NOTE — ED Notes (Signed)
Per EMS- Patient was at [facility] today. Physician called for possible sepsis due to aspiration. Patient has a history of the same. Patient denies CP or SOB. Patient received NS 270m iv

## 2015-08-02 NOTE — Patient Instructions (Signed)
[facility] of [facility] current illness Use the therapy vest twice a day We will see you in the hospital and in 4 weeks

## 2015-08-02 NOTE — ED Provider Notes (Signed)
CSN: [id number]     Arrival date & time 08/02/15  1658 History   First MD Initiated Contact with Patient 08/02/15 1701     Chief Complaint  Patient presents with  . possible sepsis      HPI Patient was sent from the pulmonology office with sepsis. Has been feeling bad for last few days. Said slight cough. History of recurrent aspiration pneumonias. Has a history of emphysema. He has oxygen at night. Slight sore throat. No dysuria. States he feels kind of bad. He's had nausea without vomiting. No diarrhea. Also found to be tachycardic with a heart rate of about 140 sending for treatment of sepsis and admission. Had been given Augmentin yesterday for likely URI symptoms.   Past Medical History  Diagnosis Date  . Hyperlipidemia     takes Simvastatin daily  . Tremor   . History of prostate cancer 2004  . On home O2   . Neuromuscular disorder ([place]) 1998    right carpal tunnel release  . Anemia associated with acute blood loss   . GERD (gastroesophageal reflux disease)     takes Omeprazole daily  . Hypertension     takes Metoprolol daily as well as Hyzaar  . Constipation     takes Colace daily as well as Miralax  . Anxiety     takes Valium daily  . Depression     takes Cymbalta daily  . Emphysema of lung (HCC)     Albuterol as needed;Symbicort daily and Singulair at bedtime  . Myocardial infarction ([place]) 04/1998  . Coronary artery disease   . Asthma   . Shortness of breath     with exertion  . History of bronchitis   . Aspiration pneumonia ([place]) 2010  . Headache, chronic daily   . History of migraine     last migraine a couple of days ago;takes Excedrin Migraine  . Chronic back pain     compression fracture  . History of colon polyps   . Mood change ([place])     after Brain surgery mood changed and was placed on Depakote  . Insomnia     takes Benadryl nightly  . 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 Lung cancer ([place]) 2016   Past Surgical History  Procedure Laterality  Date  . Cholecystectomy  1996  . 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 Annette Stable  . Vascular stent  2000    Dr. Rollene Fare; he reports cardiac stent, but denies stent for PAD 06/15/13  . Hand surgery  1989    crushed thumb, Dr. Fredna Dow  . Ulnar nerve repair  1998    left arm, Dr. Fredna Dow  . Gastrostomy w/ feeding tube  2008    Dr. Watt Climes; only had for a few months  . Prostatectomy  04/2001    removal of prostate cancer, Dr. Janice Norrie  . Coronary angioplasty with stent placement  04/1998  . Carpal tunnel release  1998    right  . Spine surgery    . 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 Back surgery  08/2004; 02/2005; 04/2006; 06/2007; 7/20101    all by Dr. Annette Stable  . Esophagogastroduodenoscopy N/A 07/23/2012    Procedure: ESOPHAGOGASTRODUODENOSCOPY (EGD);  Surgeon: Jeryl Columbia, MD;  Location: [facility] ENDOSCOPY;  Service: Endoscopy;  Laterality: N/A;  buccini /ja  . Colonoscopy    . Back surgery  05/2013  . Dg biopsy lung  Family History  Problem Relation Age of Onset  . Heart disease Mother   . Cancer Father     brain cancer and prostate cancer    Social History  Substance Use Topics  . Smoking status: Former Smoker -- 1.50 packs/day for 52 years    Types: Cigarettes    Quit date: 07/17/2012  . Smokeless tobacco: Never Used     Comment: Former smoker  . Alcohol Use: No    Review of Systems  Constitutional: Positive for appetite change and fatigue.  HENT: Positive for sore throat.   Respiratory: Positive for cough.   Cardiovascular: Positive for chest pain.  Gastrointestinal: Negative for abdominal pain.  Genitourinary: Negative for flank pain.  Musculoskeletal: Negative for back pain.  Skin: Negative for wound.  Neurological: Negative for numbness.      Allergies  Lisinopril; Lamictal; and Ambien  Home Medications   Prior to Admission medications   Medication Sig Start Date End Date Taking? Authorizing Provider  amoxicillin-clavulanate (AUGMENTIN) 875-125 MG tablet Take 1  tablet by mouth 2 (two) times daily. 08/01/15  Yes [name], MD  ARIPiprazole (ABILIFY) 10 MG tablet Take 5 mg by mouth daily.  05/09/14  Yes Historical Provider, MD  Ascorbic Acid (VITAMIN C) 1000 MG tablet Take 1,000 mg by mouth daily.   Yes Historical Provider, MD  aspirin-acetaminophen-caffeine (EXCEDRIN MIGRAINE) [phone] MG per tablet Take 2 tablets by mouth every 6 (six) hours as needed. Reported on 07/30/2015   Yes Historical Provider, MD  budesonide (PULMICORT) 0.25 MG/2ML nebulizer solution Take 4 mLs (0.5 mg total) by nebulization 2 (two) times daily. 12/07/13  Yes Kathee Delton, MD  cholecalciferol (VITAMIN D) 1000 UNITS tablet Take 1,000 Units by mouth every morning.    Yes Historical Provider, MD  diazepam (VALIUM) 5 MG tablet Take 1 tablet (5 mg total) by mouth every 8 (eight) hours as needed for anxiety or muscle spasms. scheduled 06/29/15  Yes Donita Brooks, NP  divalproex (DEPAKOTE) 500 MG 24 hr tablet Take 1,000 mg by mouth at bedtime.    Yes Historical Provider, MD  docusate sodium (COLACE) 100 MG capsule Take 200 mg by mouth at bedtime.    Yes Historical Provider, MD  DULoxetine (CYMBALTA) 60 MG capsule Take 120 mg by mouth daily.    Yes Historical Provider, MD  fluticasone (FLONASE) 50 MCG/ACT nasal spray PLACE 2 SPRAYS INTO BOTH NOSTRILS DAILY. Patient taking differently: PLACE 2 SPRAYS INTO BOTH NOSTRILS DAILY as needed for allergies 10/30/14  Yes [name], MD  HYDROcodone-acetaminophen (NORCO) 10-325 MG per tablet Take 1 tablet by mouth every 6 (six) hours as needed for moderate pain. for pain 04/07/14  Yes Historical Provider, MD  ipratropium-albuterol (DUONEB) 0.5-2.5 (3) MG/3ML SOLN Take 3 mLs by nebulization every 6 (six) hours. 12/07/13  Yes Kathee Delton, MD  losartan (COZAAR) 50 MG tablet Take 1 tablet (50 mg total) by mouth daily. Patient taking differently: Take 50 mg by mouth at bedtime.  06/06/15  Yes [name], MD  metoprolol succinate  (TOPROL-XL) 50 MG 24 hr tablet TAKE 1 TABLET (50 MG TOTAL) BY MOUTH EVERY MORNING. TAKE WITH OR IMMEDIATELY FOLLOWING A MEAL. 06/14/15  Yes [name], MD  montelukast (SINGULAIR) 10 MG tablet Take 1 tablet (10 mg total) by mouth at bedtime. 06/14/15  Yes [name], MD  NITROSTAT 0.4 MG SL tablet PLACE 1 TABLET (0.4 MG TOTAL) UNDER THE TONGUE EVERY 5 (FIVE) MINUTES AS NEEDED FOR  CHEST PAIN. 08/10/14  Yes Belva Crome, MD  omeprazole (PRILOSEC) 40 MG capsule Take 40 mg by mouth 2 (two) times daily.    Yes Historical Provider, MD  polyethylene glycol (MIRALAX / GLYCOLAX) packet Take 17 g by mouth 3 (three) times daily as needed for moderate constipation. For constipation 07/24/12  Yes Delfina Redwood, MD  primidone (MYSOLINE) 250 MG tablet TAKE 1 TABLET (250 MG TOTAL) BY MOUTH 2 (TWO) TIMES DAILY. 06/14/15  Yes [name], MD  promethazine (PHENERGAN) 25 MG tablet Take 25 mg by mouth every 6 (six) hours as needed for nausea or vomiting. Reported on 04/06/2015 08/10/14  Yes Historical Provider, MD  ranitidine (ZANTAC) 150 MG tablet Take 150-300 mg by mouth 2 (two) times daily as needed for heartburn.   Yes Historical Provider, MD  simvastatin (ZOCOR) 40 MG tablet TAKE 1 TABLET (40 MG TOTAL) BY MOUTH EVERY EVENING. 06/15/15  Yes Belva Crome, MD  tiZANidine (ZANAFLEX) 4 MG tablet TAKE 1 TABLET BY MOUTH EVERY 8 HOURS AS NEEDED FOR MUSCLE SPASMS. 01/09/15  Yes [name], MD  Zinc 50 MG TABS Take 50 mg by mouth daily.    Yes Historical Provider, MD  Respiratory Therapy Supplies (FLUTTER) DEVI Use as directed. Patient not taking: Reported on 08/02/2015 06/21/15   [name], MD   BP 130/86 mmHg  Pulse 152  Temp(Src) 98.6 F (37 C) (Oral)  Resp 26  Ht '5\' 10"'$  (1.778 m)  Wt 164 lb (74.39 kg)  BMI 23.53 kg/m2  SpO2 91% Physical Exam  Constitutional: He appears well-developed.  HENT:  Head: Atraumatic.  Neck: Neck supple.  Cardiovascular:  Tachycardia   Pulmonary/Chest:  Mildly harsh breath sounds right base. Overall decreased air movement.  Abdominal: Soft. There is no tenderness.  Musculoskeletal: Normal range of motion.  Neurological: He is alert.  Skin: Skin is warm.    ED Course  Procedures (including critical care time) Labs Review Labs Reviewed  COMPREHENSIVE METABOLIC PANEL - Abnormal; Notable for the following:    Glucose, Bld 104 (*)    BUN 24 (*)    Total Protein 8.5 (*)    Albumin 3.4 (*)    ALT 13 (*)    All other components within normal limits  CBC WITH DIFFERENTIAL/PLATELET - Abnormal; Notable for the following:    WBC 13.2 (*)    RBC 4.03 (*)    Hemoglobin 11.4 (*)    HCT 33.8 (*)    RDW 16.2 (*)    Platelets 629 (*)    Neutro Abs 10.9 (*)    All other components within normal limits  VALPROIC ACID LEVEL - Abnormal; Notable for the following:    Valproic Acid Lvl 25 (*)    All other components within normal limits  CULTURE, BLOOD (ROUTINE X 2)  CULTURE, BLOOD (ROUTINE X 2)  URINE CULTURE  URINALYSIS, ROUTINE W REFLEX MICROSCOPIC (NOT AT [facility])  I-STAT CG4 LACTIC ACID, ED  I-STAT CG4 LACTIC ACID, ED    Imaging Review Dg Chest Portable 1 View  08/02/2015  CLINICAL DATA:  Possible sepsis. History of hypertension, emphysema, MI, lung cancer, bronchitis, pneumonia. History of CAD, asthma, and shortness of breath. EXAM: PORTABLE CHEST 1 VIEW COMPARISON:  07/16/2015 FINDINGS: Patient is rotated towards the left. Heart size is normal. There is mild streaky density at the right lung base, stable over prior exams and consistent with atelectasis or scarring. No focal consolidations or pleural effusions. No pulmonary edema. Spinal fixation  rods again noted. IMPRESSION: No evidence for acute cardiopulmonary abnormality. Electronically Signed   By: Nolon Nations M.D.   On: 08/02/2015 17:46   I have personally reviewed and evaluated these images and lab results as part of my medical decision-making.   EKG  Interpretation None     ED ECG REPORT   Date: 08/02/2015  Rate: 144  Rhythm: atrial flutter  QRS Axis: normal  Intervals: normal  ST/T Wave abnormalities: nonspecific ST/T changes  Conduction Disutrbances:none  Narrative Interpretation: new afib or aflutter  Old EKG Reviewed: changes noted    MDM   Final diagnoses:  Atrial flutter with rapid ventricular response (HCC)  URI (upper respiratory infection)  Sepsis, due to unspecified organism ([place])  Sepsis - Repeat Assessment  Performed at:    1900  Vitals     Blood pressure 117/83, pulse 76, temperature 99.9 F (37.7 C), temperature source Rectal, resp. rate 22, height '5\' 10"'$  (1.778 m), weight 164 lb (74.39 kg), SpO2 93 %.  Heart:     Tachycardic  Lungs:    Rales  Capillary Refill:   <2 sec  Peripheral Pulse:   Radial pulse palpable  Skin:     Normal Color   Patient presents with sepsis from pulmonary office. Likely URI symptoms and cons. Afebrile here. Lactic acid is normal. Atrial flutter with RVR. New-onset. Adenosine given to clarify since just persistent tachycardia. It revealed flutter waves. Started on Cardizem. 30/kg fluid bolus given. Urinalysis still pending since it was dumped out by nursing staff. Anabolic's given for presumed sepsis. Patient has a chads risk score of 5 at also has been having some hemoptysis now so no anticoagulation started. Discussed with pulmonology and internal medicine. Internal medicine questions answered to [place] be seen by cardiology.  CRITICAL CARE Performed by: Mackie Pai Total critical care time: 30 minutes Critical care time was exclusive of separately billable procedures and treating other patients. Critical care was necessary to treat or prevent imminent or life-threatening deterioration. Critical care was time spent personally by me on the following activities: development of treatment plan with patient and/or surrogate as well as nursing,  discussions with consultants, evaluation of patient's response to treatment, examination of patient, obtaining history from patient or surrogate, ordering and performing treatments and interventions, ordering and review of laboratory studies, ordering and review of radiographic studies, pulse oximetry and re-evaluation of patient's condition.     Davonna Belling, MD 08/02/15 2104

## 2015-08-02 NOTE — ED Notes (Signed)
Cardizem increased to 15mg/hr

## 2015-08-02 NOTE — Progress Notes (Signed)
Subjective:    Patient ID: Malik Zuniga, male    DOB: [birth year], 70 y.o.   MRN: [id number]  Synopsis: former patient of Dr. 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   Malik Zuniga 04/2009:  FEV1 1.65 (47%), FEV1% 49  CT chest 10/2013:  RLL infiltrates CXR 10/2013:  LLL infiltrate +h/o aspiration in the past.  +speech therapy evaluation with recs given.  Admitted multiple times in 2017 for recurrent aspiration pneumonia. Started therapy vest 2017  HPI  Chief Complaint  Patient presents with  . Follow-up    pt c/o sinus congestion, PND, increased sob, chills, sweats, prod cough with brown mucus X3 days.     Malik Zuniga says he started getting ill again on Monday. He has been aching all over. He is coughing up dark brown mucus. He is using his flutter valve. He says that every pore in his body hurts. He has a sore throat.   He has been feeling hot and cold throughout the week and he is sweating through his shirts. He is very weak and has no appetite. He has been a little more "loopy" lately, confused per his wife. He has been talking out of his head some, this improved with oxygen today. The oxygen that he uses helps. He took the augmentin last night and this morning. He has been drinking tea and ensure but in general his appetite is down. He has been trying to puree his foods, this has been difficult.  Past Medical History  Diagnosis Date  . Hyperlipidemia     takes Simvastatin daily  . Tremor   . History of prostate cancer   . On home O2   . Neuromuscular disorder ([place]) 1998    right carpal tunnel release  . Anemia associated with acute blood loss   . GERD (gastroesophageal reflux disease)     takes Omeprazole daily  . Hypertension     takes Metoprolol daily as well as Hyzaar  . Constipation     takes Colace daily as well as Miralax  . Anxiety    takes Valium daily  . Depression     takes Cymbalta daily  . Emphysema of lung (HCC)     Albuterol as needed;Symbicort daily and Singulair at bedtime  . Emphysema   . Myocardial infarction ([place]) 04/1998  . Coronary artery disease   . Asthma   . Shortness of breath     with exertion  . History of bronchitis   . Aspiration pneumonia ([place]) 2010  . Headache, chronic daily   . History of migraine     last migraine a couple of days ago;takes Excedrin Migraine  . Chronic back pain     compression fracture  . History of colon polyps   . Mood change ([place])     after Brain surgery mood changed and was placed on Depakote  . Insomnia     takes Benadryl nightly  . 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 Prostate cancer ([place]) 2004  . Lung cancer ([place])         Review of Systems  Constitutional: Negative for fever, chills and fatigue.  HENT: Negative for postnasal drip, rhinorrhea and sinus pressure.   Respiratory: Positive for cough and shortness of breath. Negative for wheezing.        Objective:   Physical Exam Filed Vitals:  08/02/15 1542  BP: 134/70  Pulse: 138  Temp: 97.8 F (36.6 C)  TempSrc: Oral  SpO2: 91%   RA  Gen: acutely ill appearing, in wheelchair HENT: OP clear, TM's clear, neck supple PULM: Crackles R base, normal percussion and effort CV: RRR, no mgr, trace edema GI: BS+, soft, nontender Derm: no cyanosis or rash Psyche: normal mood and affect   Last hospital records reviewed  2017 CT chest images personally reviewed showing right lower lobe bronchiectasis with patchy groundglass consistent with aspiration pneumonia. There is also a stable mass in his left lower lobe which is his known adenocarcinoma.     Assessment & Plan:  Sepsis [facility]) Unfortunately, Malik Zuniga is septic. My concern is that he has aspiration pneumonia again, that the differential diagnosis includes viral prodrome versus less likely urinary source. He does have a sore throat and body  aches which is a bit unusual for aspiration pneumonia.  Based on his high heart rate and confusion have recommended that he get in the emergency room via ambulance.  Plan: To the [facility] to transport him I have discussed his case with the ER physician Would recommend standard sepsis care, chest x-ray, broad-spectrum antibiotics to cover healthcare associated pneumonia considering his recent admission Will need speech therapy evaluation Would consult pulmonary medicine either for admission or consultation depending on results of ER assessment  Bronchiectasis without acute exacerbation [facility]) He has bronchiectasis on his CT scan of the chest in the right lower lobe.  Based on his current symptoms he is likely having an exacerbation of his bronchiectasis. With his neuromuscular weakness and recurrent exacerbations he needs assistance with mucociliary clearance. He has failed flutter valve therapy considering his current situation.  Plan: Prescribed therapy vest in hospital twice a day Prescribed therapy vest to be used at home twice a day for mucociliary clearance for his bronchiectasis     Current outpatient prescriptions:  .  amoxicillin-clavulanate (AUGMENTIN) 875-125 MG tablet, Take 1 tablet by mouth 2 (two) times daily., Disp: 14 tablet, Rfl: 0 .  ARIPiprazole (ABILIFY) 10 MG tablet, Take 5 mg by mouth daily. , Disp: , Rfl:  .  Ascorbic Acid (VITAMIN C) 1000 MG tablet, Take 1,000 mg by mouth daily., Disp: , Rfl:  .  aspirin-acetaminophen-caffeine (EXCEDRIN MIGRAINE) 250-250-65 MG per tablet, Take 2 tablets by mouth every 6 (six) hours as needed. Reported on 07/30/2015, Disp: , Rfl:  .  budesonide (PULMICORT) 0.25 MG/2ML nebulizer solution, Take 4 mLs (0.5 mg total) by nebulization 2 (two) times daily., Disp: 60 mL, Rfl: 3 .  cetirizine (ZYRTEC) 10 MG tablet, TAKE 1 TABLET BY MOUTH DAILY., Disp: 30 tablet, Rfl: 10 .  cholecalciferol (VITAMIN D) 1000 UNITS tablet, Take  1,000 Units by mouth every morning. , Disp: , Rfl:  .  diazepam (VALIUM) 5 MG tablet, Take 1 tablet (5 mg total) by mouth every 8 (eight) hours as needed for anxiety or muscle spasms. scheduled, Disp: 30 tablet, Rfl: 0 .  divalproex (DEPAKOTE) 500 MG 24 hr tablet, Take 1,000 mg by mouth at bedtime. , Disp: , Rfl:  .  DM-Doxylamine-Acetaminophen (NYQUIL COLD & FLU PO), Take 2 capsules by mouth at bedtime as needed (sleep)., Disp: , Rfl:  .  docusate sodium (COLACE) 100 MG capsule, Take 200 mg by mouth at bedtime. , Disp: , Rfl:  .  DULoxetine (CYMBALTA) 60 MG capsule, Take 120 mg by mouth daily. , Disp: , Rfl:  .  fluticasone (FLONASE) 50 MCG/ACT nasal  spray, PLACE 2 SPRAYS INTO BOTH NOSTRILS DAILY. (Patient taking differently: PLACE 2 SPRAYS INTO BOTH NOSTRILS DAILY as needed for allergies), Disp: 16 g, Rfl: 2 .  HYDROcodone-acetaminophen (NORCO) 10-325 MG per tablet, Take 1 tablet by mouth every 6 (six) hours as needed for moderate pain. for pain, Disp: , Rfl: 0 .  ipratropium-albuterol (DUONEB) 0.5-2.5 (3) MG/3ML SOLN, Take 3 mLs by nebulization every 6 (six) hours., Disp: 360 mL, Rfl: 3 .  losartan (COZAAR) 50 MG tablet, Take 1 tablet (50 mg total) by mouth daily., Disp: 30 tablet, Rfl: 6 .  metoprolol succinate (TOPROL-XL) 50 MG 24 hr tablet, TAKE 1 TABLET (50 MG TOTAL) BY MOUTH EVERY MORNING. TAKE WITH OR IMMEDIATELY FOLLOWING A MEAL., Disp: 90 tablet, Rfl: 1 .  montelukast (SINGULAIR) 10 MG tablet, Take 1 tablet (10 mg total) by mouth at bedtime., Disp: 90 tablet, Rfl: 1 .  NITROSTAT 0.4 MG SL tablet, PLACE 1 TABLET (0.4 MG TOTAL) UNDER THE TONGUE EVERY 5 (FIVE) MINUTES AS NEEDED FOR CHEST PAIN., Disp: 25 tablet, Rfl: 1 .  omeprazole (PRILOSEC) 40 MG capsule, Take 40 mg by mouth 2 (two) times daily. , Disp: , Rfl:  .  polyethylene glycol (MIRALAX / GLYCOLAX) packet, Take 17 g by mouth 3 (three) times daily as needed for moderate constipation. For constipation, Disp: , Rfl:  .  primidone  (MYSOLINE) 250 MG tablet, TAKE 1 TABLET (250 MG TOTAL) BY MOUTH 2 (TWO) TIMES DAILY., Disp: 180 tablet, Rfl: 1 .  promethazine (PHENERGAN) 25 MG tablet, Take 25 mg by mouth every 6 (six) hours as needed for nausea or vomiting. Reported on 04/06/2015, Disp: , Rfl: 1 .  ranitidine (ZANTAC) 150 MG tablet, Take 150-300 mg by mouth 2 (two) times daily as needed for heartburn., Disp: , Rfl:  .  Respiratory Therapy Supplies (FLUTTER) DEVI, Use as directed., Disp: 1 each, Rfl: 0 .  simvastatin (ZOCOR) 40 MG tablet, TAKE 1 TABLET (40 MG TOTAL) BY MOUTH EVERY EVENING., Disp: 90 tablet, Rfl: 3 .  tiZANidine (ZANAFLEX) 4 MG tablet, TAKE 1 TABLET BY MOUTH EVERY 8 HOURS AS NEEDED FOR MUSCLE SPASMS., Disp: 90 tablet, Rfl: 1 .  Zinc 50 MG TABS, Take 50 mg by mouth daily. , Disp: , Rfl:

## 2015-08-02 NOTE — Assessment & Plan Note (Signed)
Unfortunately, Malik Zuniga is septic. My concern is that he has aspiration pneumonia again, that the differential diagnosis includes viral prodrome versus less likely urinary source. He does have a sore throat and body aches which is a bit unusual for aspiration pneumonia.  Based on his high heart rate and confusion have recommended that he get in the emergency room via ambulance.  Plan: To the [facility] to transport him I have discussed his case with the ER physician Would recommend standard sepsis care, chest x-ray, broad-spectrum antibiotics to cover healthcare associated pneumonia considering his recent admission Will need speech therapy evaluation Would consult pulmonary medicine either for admission or consultation depending on results of ER assessment

## 2015-08-03 ENCOUNTER — Other Ambulatory Visit: Payer: Self-pay

## 2015-08-03 ENCOUNTER — Other Ambulatory Visit: Payer: Medicare Other

## 2015-08-03 ENCOUNTER — Inpatient Hospital Stay (HOSPITAL_COMMUNITY): Payer: Medicare Other

## 2015-08-03 ENCOUNTER — Ambulatory Visit: Payer: Medicare Other | Admitting: Physical Therapy

## 2015-08-03 DIAGNOSIS — J449 Chronic obstructive pulmonary disease, unspecified: Secondary | ICD-10-CM | POA: Diagnosis present

## 2015-08-03 DIAGNOSIS — J438 Other emphysema: Secondary | ICD-10-CM

## 2015-08-03 DIAGNOSIS — I4892 Unspecified atrial flutter: Secondary | ICD-10-CM

## 2015-08-03 LAB — POCT I-STAT 3, ART BLOOD GAS (G3+)
ACID-BASE DEFICIT: 4 mmol/L — AB (ref 0.0–2.0)
ACID-BASE DEFICIT: 3 mmol/L — AB (ref 0.0–2.0)
BICARBONATE: 22.2 meq/L (ref 20.0–24.0)
Bicarbonate: 20 mEq/L (ref 20.0–24.0)
O2 SAT: 98 %
O2 SAT: 100 %
PCO2 ART: 32.3 mmHg — AB (ref 35.0–45.0)
PO2 ART: 99 mmHg (ref 80.0–100.0)
Patient temperature: 98.6
TCO2: 21 mmol/L (ref 0–100)
TCO2: 23 mmol/L (ref 0–100)
pCO2 arterial: 38.2 mmHg (ref 35.0–45.0)
pH, Arterial: 7.4 (ref 7.350–7.450)
pH, Arterial: 7.37 (ref 7.350–7.450)
pO2, Arterial: 249 mmHg — ABNORMAL HIGH (ref 80.0–100.0)

## 2015-08-03 LAB — CBC
HEMATOCRIT: 29.9 % — AB (ref 39.0–52.0)
HEMOGLOBIN: 9.6 g/dL — AB (ref 13.0–17.0)
MCH: 27 pg (ref 26.0–34.0)
MCHC: 32.1 g/dL (ref 30.0–36.0)
MCV: 84.2 fL (ref 78.0–100.0)
Platelets: 514 10*3/uL — ABNORMAL HIGH (ref 150–400)
RBC: 3.55 MIL/uL — ABNORMAL LOW (ref 4.22–5.81)
RDW: 16.1 % — AB (ref 11.5–15.5)
WBC: 10.4 10*3/uL (ref 4.0–10.5)

## 2015-08-03 LAB — LACTIC ACID, PLASMA
LACTIC ACID, VENOUS: 1.4 mmol/L (ref 0.5–2.0)
Lactic Acid, Venous: 1.2 mmol/L (ref 0.5–2.0)

## 2015-08-03 LAB — GLUCOSE, CAPILLARY
Glucose-Capillary: 113 mg/dL — ABNORMAL HIGH (ref 65–99)
Glucose-Capillary: 133 mg/dL — ABNORMAL HIGH (ref 65–99)
Glucose-Capillary: 107 mg/dL — ABNORMAL HIGH (ref 65–99)
Glucose-Capillary: 118 mg/dL — ABNORMAL HIGH (ref 65–99)

## 2015-08-03 LAB — PROTIME-INR
INR: 1.29 (ref 0.00–1.49)
Prothrombin Time: 16.3 seconds — ABNORMAL HIGH (ref 11.6–15.2)

## 2015-08-03 LAB — PROCALCITONIN

## 2015-08-03 LAB — BASIC METABOLIC PANEL
Anion gap: 9 (ref 5–15)
BUN: 12 mg/dL (ref 6–20)
CHLORIDE: 106 mmol/L (ref 101–111)
CO2: 22 mmol/L (ref 22–32)
Calcium: 8.2 mg/dL — ABNORMAL LOW (ref 8.9–10.3)
Creatinine, Ser: 0.73 mg/dL (ref 0.61–1.24)
GFR calc Af Amer: >60 mL/min (ref >60)
GFR calc non Af Amer: >60 mL/min (ref >60)
Glucose, Bld: 104 mg/dL — ABNORMAL HIGH (ref 65–99)
POTASSIUM: 4 mmol/L (ref 3.5–5.1)
SODIUM: 137 mmol/L (ref 135–145)

## 2015-08-03 LAB — MAGNESIUM
MAGNESIUM: 1.7 mg/dL (ref 1.7–2.4)
Magnesium: 1.6 mg/dL — ABNORMAL LOW (ref 1.7–2.4)
Magnesium: 1.7 mg/dL (ref 1.7–2.4)

## 2015-08-03 LAB — PHOSPHORUS
PHOSPHORUS: 2.6 mg/dL (ref 2.5–4.6)
Phosphorus: 2.8 mg/dL (ref 2.5–4.6)

## 2015-08-03 LAB — APTT: aPTT: 38 seconds — ABNORMAL HIGH (ref 24–37)

## 2015-08-03 LAB — MRSA PCR SCREENING: MRSA BY PCR: NEGATIVE

## 2015-08-03 MED ORDER — AMIODARONE HCL IN DEXTROSE 360-4.14 MG/200ML-% IV SOLN
60.0000 mg/h | INTRAVENOUS | Status: AC
  Administered 2015-08-03 (×2): 60 mg/h via INTRAVENOUS
  Filled 2015-08-03: qty 200

## 2015-08-03 MED ORDER — DIGOXIN 0.25 MG/ML IJ SOLN
0.2500 mg | Freq: Once | INTRAMUSCULAR | Status: AC
  Administered 2015-08-03: 0.25 mg via INTRAVENOUS
  Filled 2015-08-03: qty 2

## 2015-08-03 MED ORDER — VITAL AF 1.2 CAL PO LIQD
1000.0000 mL | ORAL | Status: DC
  Administered 2015-08-03: 1000 mL

## 2015-08-03 MED ORDER — SODIUM CHLORIDE 0.9 % IV BOLUS (SEPSIS)
1000.0000 mL | Freq: Once | INTRAVENOUS | Status: AC
Start: 2015-08-03 — End: 2015-08-03
  Administered 2015-08-03: 1000 mL via INTRAVENOUS

## 2015-08-03 MED ORDER — AMIODARONE IV BOLUS ONLY 150 MG/100ML
150.0000 mg | Freq: Once | INTRAVENOUS | Status: AC
  Administered 2015-08-03: 150 mg via INTRAVENOUS

## 2015-08-03 MED ORDER — AMIODARONE LOAD VIA INFUSION
150.0000 mg | Freq: Once | INTRAVENOUS | Status: AC
  Administered 2015-08-03: 150 mg via INTRAVENOUS
  Filled 2015-08-03: qty 83.34

## 2015-08-03 MED ORDER — AMIODARONE HCL IN DEXTROSE 360-4.14 MG/200ML-% IV SOLN
30.0000 mg/h | INTRAVENOUS | Status: DC
  Administered 2015-08-03 – 2015-08-05 (×6): 30 mg/h via INTRAVENOUS
  Filled 2015-08-03 (×8): qty 200

## 2015-08-03 MED ORDER — AMIODARONE HCL IN DEXTROSE 360-4.14 MG/200ML-% IV SOLN
INTRAVENOUS | Status: AC
  Filled 2015-08-03: qty 200

## 2015-08-03 MED ORDER — SODIUM CHLORIDE 0.9 % IV SOLN
10.0000 ug/h | INTRAVENOUS | Status: DC
  Administered 2015-08-03: 25 ug/h via INTRAVENOUS
  Administered 2015-08-04: 175 ug/h via INTRAVENOUS
  Filled 2015-08-03 (×2): qty 50

## 2015-08-03 MED ORDER — MIDAZOLAM HCL 2 MG/2ML IJ SOLN
2.0000 mg | Freq: Once | INTRAMUSCULAR | Status: AC
  Administered 2015-08-03: 2 mg via INTRAVENOUS

## 2015-08-03 MED ORDER — PRO-STAT SUGAR FREE PO LIQD
30.0000 mL | Freq: Two times a day (BID) | ORAL | Status: DC
  Administered 2015-08-03: 30 mL
  Filled 2015-08-03: qty 30

## 2015-08-03 MED ORDER — VECURONIUM BROMIDE 10 MG IV SOLR
INTRAVENOUS | Status: AC
  Filled 2015-08-03: qty 10

## 2015-08-03 MED ORDER — DOCUSATE SODIUM 50 MG/5ML PO LIQD
200.0000 mg | Freq: Every day | ORAL | Status: DC
  Administered 2015-08-03: 200 mg via ORAL
  Filled 2015-08-03 (×2): qty 20

## 2015-08-03 MED ORDER — LEVALBUTEROL HCL 0.63 MG/3ML IN NEBU
0.6300 mg | INHALATION_SOLUTION | Freq: Four times a day (QID) | RESPIRATORY_TRACT | Status: DC
  Administered 2015-08-03 – 2015-08-04 (×5): 0.63 mg via RESPIRATORY_TRACT
  Filled 2015-08-03 (×5): qty 3

## 2015-08-03 MED ORDER — SODIUM CHLORIDE 0.9% FLUSH: 10.0000 mL | INTRAVENOUS | Status: DC | PRN

## 2015-08-03 MED ORDER — MIDAZOLAM HCL 2 MG/2ML IJ SOLN
INTRAMUSCULAR | Status: AC
  Administered 2015-08-03: 2 mg via INTRAVENOUS
  Filled 2015-08-03: qty 2

## 2015-08-03 MED ORDER — IPRATROPIUM BROMIDE 0.02 % IN SOLN
0.5000 mg | Freq: Four times a day (QID) | RESPIRATORY_TRACT | Status: DC
  Administered 2015-08-03 – 2015-08-04 (×5): 0.5 mg via RESPIRATORY_TRACT
  Filled 2015-08-03 (×5): qty 2.5

## 2015-08-03 MED ORDER — METOPROLOL TARTRATE 25 MG PO TABS
25.0000 mg | ORAL_TABLET | Freq: Once | ORAL | Status: AC
  Administered 2015-08-03: 25 mg via ORAL
  Filled 2015-08-03: qty 1

## 2015-08-03 MED ORDER — VITAL HIGH PROTEIN PO LIQD
1000.0000 mL | ORAL | Status: DC
  Administered 2015-08-03: 1000 mL

## 2015-08-03 MED ORDER — PRO-STAT SUGAR FREE PO LIQD: 30.0000 mL | Freq: Every day | ORAL | Status: DC

## 2015-08-03 MED ORDER — METRONIDAZOLE IN NACL 5-0.79 MG/ML-% IV SOLN
500.0000 mg | Freq: Three times a day (TID) | INTRAVENOUS | Status: DC
  Filled 2015-08-03 (×2): qty 100

## 2015-08-03 MED ORDER — PANTOPRAZOLE SODIUM 40 MG PO PACK: 40.0000 mg | PACK | Freq: Every day | ORAL | Status: DC

## 2015-08-03 MED ORDER — SODIUM CHLORIDE 0.9% FLUSH
10.0000 mL | Freq: Two times a day (BID) | INTRAVENOUS | Status: DC
  Administered 2015-08-03 – 2015-08-08 (×7): 10 mL

## 2015-08-03 MED ORDER — ETOMIDATE 2 MG/ML IV SOLN
20.0000 mg | Freq: Once | INTRAVENOUS | Status: AC
Start: 2015-08-03 — End: 2015-08-03
  Administered 2015-08-03: 20 mg via INTRAVENOUS

## 2015-08-03 MED ORDER — ANTISEPTIC ORAL RINSE SOLUTION (CORINZ)
7.0000 mL | OROMUCOSAL | Status: DC
  Administered 2015-08-03 – 2015-08-04 (×10): 7 mL via OROMUCOSAL

## 2015-08-03 MED ORDER — VECURONIUM BROMIDE 10 MG IV SOLR
6.0000 mg | Freq: Once | INTRAVENOUS | Status: AC
  Administered 2015-08-03: 6 mg via INTRAVENOUS

## 2015-08-03 MED ORDER — CETYLPYRIDINIUM CHLORIDE 0.05 % MT LIQD
7.0000 mL | Freq: Two times a day (BID) | OROMUCOSAL | Status: DC
  Administered 2015-08-03: 7 mL via OROMUCOSAL

## 2015-08-03 MED ORDER — MIDAZOLAM HCL 2 MG/2ML IJ SOLN
2.0000 mg | Freq: Once | INTRAMUSCULAR | Status: AC
Start: 2015-08-03 — End: 2015-08-03
  Administered 2015-08-03: 2 mg via INTRAVENOUS

## 2015-08-03 MED ORDER — FENTANYL CITRATE (PF) 100 MCG/2ML IJ SOLN
INTRAMUSCULAR | Status: AC
  Administered 2015-08-03: 100 ug via INTRAVENOUS
  Filled 2015-08-03: qty 2

## 2015-08-03 MED ORDER — VALPROATE SODIUM 250 MG/5ML PO SOLN
500.0000 mg | Freq: Two times a day (BID) | ORAL | Status: DC
  Administered 2015-08-03 – 2015-08-04 (×2): 500 mg via ORAL
  Filled 2015-08-03 (×4): qty 10

## 2015-08-03 MED ORDER — CHLORHEXIDINE GLUCONATE 0.12% ORAL RINSE (MEDLINE KIT)
15.0000 mL | Freq: Two times a day (BID) | OROMUCOSAL | Status: DC
  Administered 2015-08-04: 15 mL via OROMUCOSAL

## 2015-08-03 MED ORDER — METOPROLOL TARTRATE 5 MG/5ML IV SOLN: 5.0000 mg | Freq: Once | INTRAVENOUS | Status: DC

## 2015-08-03 MED ORDER — FENTANYL CITRATE (PF) 100 MCG/2ML IJ SOLN
100.0000 ug | Freq: Once | INTRAMUSCULAR | Status: AC
  Administered 2015-08-03: 100 ug via INTRAVENOUS

## 2015-08-03 MED ORDER — METHYLPREDNISOLONE SODIUM SUCC 40 MG IJ SOLR
40.0000 mg | Freq: Two times a day (BID) | INTRAMUSCULAR | Status: DC
  Administered 2015-08-03 – 2015-08-05 (×5): 40 mg via INTRAVENOUS
  Filled 2015-08-03 (×5): qty 1

## 2015-08-03 MED ORDER — METOPROLOL TARTRATE 5 MG/5ML IV SOLN
INTRAVENOUS | Status: AC
  Administered 2015-08-03: 5 mg
  Filled 2015-08-03: qty 5

## 2015-08-03 MED ORDER — CHLORHEXIDINE GLUCONATE 0.12% ORAL RINSE (MEDLINE KIT)
15.0000 mL | Freq: Two times a day (BID) | OROMUCOSAL | Status: DC
  Administered 2015-08-03: 15 mL via OROMUCOSAL

## 2015-08-03 MED ORDER — FENTANYL CITRATE (PF) 100 MCG/2ML IJ SOLN: 50.0000 ug | INTRAMUSCULAR | Status: DC | PRN

## 2015-08-03 MED ORDER — SODIUM CHLORIDE 0.9 % IV SOLN
1000.0000 mL | INTRAVENOUS | Status: DC
  Administered 2015-08-03: 1000 mL via INTRAVENOUS

## 2015-08-03 MED ORDER — ANTISEPTIC ORAL RINSE SOLUTION (CORINZ): 7.0000 mL | Freq: Four times a day (QID) | OROMUCOSAL | Status: DC

## 2015-08-03 MED ORDER — FENTANYL CITRATE (PF) 100 MCG/2ML IJ SOLN
50.0000 ug | INTRAMUSCULAR | Status: DC | PRN
  Administered 2015-08-03 (×2): 50 ug via INTRAVENOUS
  Filled 2015-08-03 (×2): qty 2

## 2015-08-03 MED ORDER — INSULIN ASPART 100 UNIT/ML ~~LOC~~ SOLN
0.0000 [IU] | SUBCUTANEOUS | Status: DC
  Administered 2015-08-03 – 2015-08-04 (×3): 1 [IU] via SUBCUTANEOUS
  Administered 2015-08-04: 2 [IU] via SUBCUTANEOUS
  Administered 2015-08-04 (×2): 1 [IU] via SUBCUTANEOUS
  Administered 2015-08-04: 2 [IU] via SUBCUTANEOUS
  Administered 2015-08-05 (×4): 1 [IU] via SUBCUTANEOUS
  Administered 2015-08-05: 2 [IU] via SUBCUTANEOUS
  Administered 2015-08-06: 1 [IU] via SUBCUTANEOUS

## 2015-08-03 MED ORDER — IPRATROPIUM-ALBUTEROL 0.5-2.5 (3) MG/3ML IN SOLN
3.0000 mL | Freq: Four times a day (QID) | RESPIRATORY_TRACT | Status: DC
  Filled 2015-08-03: qty 3

## 2015-08-03 NOTE — Consult Note (Signed)
NEURO HOSPITALIST CONSULT NOTE   Requestig physician: Dr. Wyvonna Plum   Reason for Consult: ? Neuromuscular respiratory failure.   History obtained from:   Chart     HPI:                                                                                                                                          ZARIN KNUPP is an  "70 year old man with COPD, history of NSCLC of the RLL S/P SBRT , recurrent aspiration and bronchiectasis in the right lower lobe. He was formally followed by Dr. [name] and is now followed by Dr. [name] in the pulmonary clinic.  He was sent in for the pulmonary clinic today where he was noted to be confused and tachycardic. He reports 4 days of coughing up.brown mucus and hemoptysis of blood tinged sputum appearing pink or the last 24 hours multiple episodes. This history is confirmed by his wife. He reports feeling warm and sweats but no fever was recorded. He was prescribed Augmentin which she took he has taken this in the past when he needed prolonged antibiotics. He has tried to be compliant with a pured diet  In the ED was noted to have a lactate of 1.3 and a white count of 13.2 chest x-ray showed no clear infiltrates and a mild right lower lobe platelike atelectasis He was given cefepime and vancomycin and started on Cardizem for atrial fibrillation and RVR   He was treated with SB RT for right lower lobe adenocarcinoma in 2016 with decrease in the size of the nodule and a follow-up scan in 04/2015.He was admitted multiple times in 2017 for recurrent aspiration pneumonia and last scan from 04/2015 shows improved left lower lobe infiltrate.  His spirometry from 04/2009 shows FEV1 of 1.65-47% with a ratio 49 suggesting stage III COPD"  Neurology was asked to see patient for possibility of Neuromuscular respiratory failure.   Attempts to reach family by phone were made but no success. No family in room.   Past Medical History  Diagnosis  Date  . Hyperlipidemia     takes Simvastatin daily  . Tremor   . History of prostate cancer 2004  . On home O2   . Neuromuscular disorder ([place]) 1998    right carpal tunnel release  . Anemia associated with acute blood loss   . GERD (gastroesophageal reflux disease)     takes Omeprazole daily  . Hypertension     takes Metoprolol daily as well as Hyzaar  . Constipation     takes Colace daily as well as Miralax  . Anxiety     takes Valium daily  . Depression     takes Cymbalta daily  . Emphysema of lung ([facility])     Albuterol as needed;Symbicort  daily and Singulair at bedtime  . Myocardial infarction ([place]) 04/1998  . Coronary artery disease   . Asthma   . Shortness of breath     with exertion  . History of bronchitis   . Aspiration pneumonia ([place]) 2010  . Headache, chronic daily   . History of migraine     last migraine a couple of days ago;takes Excedrin Migraine  . Chronic back pain     compression fracture  . History of colon polyps   . Mood change ([place])     after Brain surgery mood changed and was placed on Depakote  . Insomnia     takes Benadryl nightly  . 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 Lung cancer ([place]) 2016    Past Surgical History  Procedure Laterality Date  . Cholecystectomy  1996  . 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 Annette Stable  . Vascular stent  2000    Dr. Rollene Fare; he reports cardiac stent, but denies stent for PAD 06/15/13  . Hand surgery  1989    crushed thumb, Dr. Fredna Dow  . Ulnar nerve repair  1998    left arm, Dr. Fredna Dow  . Gastrostomy w/ feeding tube  2008    Dr. Watt Climes; only had for a few months  . Prostatectomy  04/2001    removal of prostate cancer, Dr. Janice Norrie  . Coronary angioplasty with stent placement  04/1998  . Carpal tunnel release  1998    right  . Spine surgery    . 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 Back surgery  08/2004; 02/2005; 04/2006; 06/2007; 7/20101    all by Dr. Annette Stable  . Esophagogastroduodenoscopy N/A 07/23/2012     Procedure: ESOPHAGOGASTRODUODENOSCOPY (EGD);  Surgeon: Jeryl Columbia, MD;  Location: [facility] ENDOSCOPY;  Service: Endoscopy;  Laterality: N/A;  buccini /ja  . Colonoscopy    . Back surgery  05/2013  . Dg biopsy lung      Family History  Problem Relation Age of Onset  . Heart disease Mother   . Cancer Father     brain cancer and prostate cancer      Social History:  reports that he quit smoking about 3 years ago. His smoking use included Cigarettes. He has a 78 pack-year smoking history. He has never used smokeless tobacco. He reports that he uses illicit drugs ([place]). He reports that he does not drink alcohol.  Allergies  Allergen Reactions  . Lisinopril Swelling    ANGIOEDEMA  . Lamictal [Lamotrigine] Other (See Comments)    Weakness/difficulty swallowing  . Ambien [Zolpidem] Other (See Comments)    Unknown reaction    MEDICATIONS:                                                                                                                     Prior to Admission:  Prescriptions prior to admission  Medication Sig Dispense Refill Last Dose  . ARIPiprazole (ABILIFY)  10 MG tablet Take 5 mg by mouth daily.    08/02/2015 at Unknown time  . Ascorbic Acid (VITAMIN C) 1000 MG tablet Take 1,000 mg by mouth daily.   08/02/2015 at Unknown time  . aspirin-acetaminophen-caffeine (EXCEDRIN MIGRAINE) 250-250-65 MG per tablet Take 2 tablets by mouth every 6 (six) hours as needed. Reported on 07/30/2015   08/02/2015 at Unknown time  . budesonide (PULMICORT) 0.25 MG/2ML nebulizer solution Take 4 mLs (0.5 mg total) by nebulization 2 (two) times daily. 60 mL 3 08/02/2015 at Unknown time  . cholecalciferol (VITAMIN D) 1000 UNITS tablet Take 1,000 Units by mouth every morning.    08/02/2015 at Unknown time  . diazepam (VALIUM) 5 MG tablet Take 1 tablet (5 mg total) by mouth every 8 (eight) hours as needed for anxiety or muscle spasms. scheduled 30 tablet 0 08/02/2015 at Unknown time  . divalproex (DEPAKOTE)  500 MG 24 hr tablet Take 1,000 mg by mouth at bedtime.    08/01/2015 at Unknown time  . docusate sodium (COLACE) 100 MG capsule Take 200 mg by mouth at bedtime.    08/01/2015 at Unknown time  . DULoxetine (CYMBALTA) 60 MG capsule Take 120 mg by mouth daily.    08/02/2015 at Unknown time  . fluticasone (FLONASE) 50 MCG/ACT nasal spray PLACE 2 SPRAYS INTO BOTH NOSTRILS DAILY. (Patient taking differently: PLACE 2 SPRAYS INTO BOTH NOSTRILS DAILY as needed for allergies) 16 g 2 Past Month at Unknown time  . HYDROcodone-acetaminophen (NORCO) 10-325 MG per tablet Take 1 tablet by mouth every 6 (six) hours as needed for moderate pain. for pain  0 08/02/2015 at 1500  . ipratropium-albuterol (DUONEB) 0.5-2.5 (3) MG/3ML SOLN Take 3 mLs by nebulization every 6 (six) hours. 360 mL 3 08/02/2015 at Unknown time  . losartan (COZAAR) 50 MG tablet Take 1 tablet (50 mg total) by mouth daily. (Patient taking differently: Take 50 mg by mouth at bedtime. ) 30 tablet 6 08/01/2015 at Unknown time  . metoprolol succinate (TOPROL-XL) 50 MG 24 hr tablet TAKE 1 TABLET (50 MG TOTAL) BY MOUTH EVERY MORNING. TAKE WITH OR IMMEDIATELY FOLLOWING A MEAL. 90 tablet 1 08/02/2015 at 0530  . montelukast (SINGULAIR) 10 MG tablet Take 1 tablet (10 mg total) by mouth at bedtime. 90 tablet 1 08/01/2015 at Unknown time  . NITROSTAT 0.4 MG SL tablet PLACE 1 TABLET (0.4 MG TOTAL) UNDER THE TONGUE EVERY 5 (FIVE) MINUTES AS NEEDED FOR CHEST PAIN. 25 tablet 1 Unknown  . omeprazole (PRILOSEC) 40 MG capsule Take 40 mg by mouth 2 (two) times daily.    08/02/2015 at Unknown time  . polyethylene glycol (MIRALAX / GLYCOLAX) packet Take 17 g by mouth 3 (three) times daily as needed for moderate constipation. For constipation   08/01/2015 at Unknown time  . primidone (MYSOLINE) 250 MG tablet TAKE 1 TABLET (250 MG TOTAL) BY MOUTH 2 (TWO) TIMES DAILY. 180 tablet 1 08/02/2015 at am  . promethazine (PHENERGAN) 25 MG tablet Take 25 mg by mouth every 6 (six) hours as needed  for nausea or vomiting. Reported on 04/06/2015  1 08/01/2015 at Unknown time  . ranitidine (ZANTAC) 150 MG tablet Take 150-300 mg by mouth 2 (two) times daily as needed for heartburn.   Past Week at Unknown time  . simvastatin (ZOCOR) 40 MG tablet TAKE 1 TABLET (40 MG TOTAL) BY MOUTH EVERY EVENING. 90 tablet 3 08/01/2015 at Unknown time  . tiZANidine (ZANAFLEX) 4 MG tablet TAKE 1 TABLET BY MOUTH EVERY 8  HOURS AS NEEDED FOR MUSCLE SPASMS. 90 tablet 1 08/01/2015 at Unknown time  . Zinc 50 MG TABS Take 50 mg by mouth daily.    08/02/2015 at Unknown time  . Respiratory Therapy Supplies (FLUTTER) DEVI Use as directed. (Patient not taking: Reported on 08/02/2015) 1 each 0 Taking   Scheduled: . antiseptic oral rinse  7 mL Mouth Rinse BID  . budesonide  0.5 mg Nebulization BID  . divalproex  1,000 mg Oral QHS  . docusate sodium  200 mg Oral QHS  . famotidine (PEPCID) IV  20 mg Intravenous Q12H  . feeding supplement (PRO-STAT SUGAR FREE 64)  30 mL Per Tube BID  . feeding supplement (VITAL HIGH PROTEIN)  1,000 mL Per Tube Q24H  . insulin aspart  0-9 Units Subcutaneous Q4H  . ipratropium-albuterol  3 mL Nebulization Q6H  . methylPREDNISolone (SOLU-MEDROL) injection  40 mg Intravenous Q12H  . metoprolol  5 mg Intravenous Once  . metoprolol succinate  50 mg Oral Daily  . metronidazole  500 mg Intravenous Q8H  . pantoprazole sodium  40 mg Per Tube Q1200  . piperacillin-tazobactam (ZOSYN)  IV  3.375 g Intravenous Q8H  . primidone  250 mg Oral BID  . simvastatin  40 mg Oral q1800  . vancomycin  1,000 mg Intravenous Q12H  . vecuronium         ROS:                                                                                                                                       History obtained from unobtainable from patient due to intubated     Blood pressure 129/91, pulse 123, temperature 97.8 F (36.6 C), temperature source Oral, resp. rate 24, height '5\' 10"'$  (1.778 m), weight 73.1 kg (161 lb 2.5  oz), SpO2 99 %.   Neurologic Examination:                                                                                                      HEENT-  Normocephalic, no lesions, without obvious abnormality.  Normal external eye and conjunctiva.  Normal TM's bilaterally.  Normal auditory canals and external ears. Normal external nose, mucus membranes and septum.  Normal pharynx. Cardiovascular- S1, S2 normal, pulses palpable throughout   Lungs- no tachypnea, retractions or cyanosis Abdomen- normal findings: bowel sounds normal Extremities- no edema Lymph-no adenopathy palpable Musculoskeletal-no joint tenderness, deformity or swelling Skin-warm and dry, no hyperpigmentation, vitiligo, or suspicious lesions  Neurological Examination  Currently intubated and just paralyzed. He has sluggish pupils, no Dolls', flaccid throughout, no DTR and on the ventilator.   Per PCCM NP prior to intubation he was coughing up copious amounts of sputum and having difficulty keeping airway clear--thus intubation was needed.       Lab Results: Basic Metabolic Panel:  Recent Labs Lab 08/02/15 1744 08/02/15 2353 08/03/15 0215  NA 135  --  137  K 4.2  --  4.0  CL 101  --  106  CO2 23  --  22  GLUCOSE 104*  --  104*  BUN 24*  --  12  CREATININE 0.91  --  0.73  CALCIUM 9.3  --  8.2*  MG  --  1.6*  --     Liver Function Tests:  Recent Labs Lab 08/02/15 1744  AST 18  ALT 13*  ALKPHOS 53  BILITOT 0.6  PROT 8.5*  ALBUMIN 3.4*   No results for input(s): LIPASE, AMYLASE in the last 168 hours. No results for input(s): AMMONIA in the last 168 hours.  CBC:  Recent Labs Lab 08/02/15 1744 08/03/15 0215  WBC 13.2* 10.4  NEUTROABS 10.9*  --   HGB 11.4* 9.6*  HCT 33.8* 29.9*  MCV 83.9 84.2  PLT 629* 514*    Cardiac Enzymes: No results for input(s): CKTOTAL, CKMB, CKMBINDEX, TROPONINI in the last 168 hours.  Lipid Panel: No results for input(s): CHOL, TRIG, HDL, CHOLHDL, VLDL, LDLCALC in  the last 168 hours.  CBG: No results for input(s): GLUCAP in the last 168 hours.  Microbiology: Results for orders placed or performed during the hospital encounter of 08/02/15  MRSA PCR Screening     Status: None   Collection Time: 08/02/15 11:32 PM  Result Value Ref Range Status   MRSA by PCR NEGATIVE NEGATIVE Final    Comment:        The GeneXpert MRSA Assay (FDA approved for NASAL specimens only), is one component of a comprehensive MRSA colonization surveillance program. It is not intended to diagnose MRSA infection nor to guide or monitor treatment for MRSA infections.     Coagulation Studies:  Recent Labs  08/02/15 2353  LABPROT 16.3*  INR 1.29    Imaging: Dg Chest Port 1 View  08/03/2015  CLINICAL DATA:  Intubation and nasogastric tube placement. EXAM: PORTABLE CHEST 1 VIEW COMPARISON:  08/02/2015 FINDINGS: Two frontal views of the chest. Endotracheal tube is difficult to visualize centrally but is felt to terminate 9.2 cm above the carina, well below the level of the clavicles. Nasogastric tube terminates at the body of the stomach, but the side-port is not visualized and may be at or just above the gastroesophageal junction. thoracolumbar spine fixation hardware and multiple overlying wires and leads. Patient rotated left. Normal heart size. No pleural effusion or pneumothorax. Underlying hyperinflation and bibasilar interstitial thickening. Especially compared to 07/16/15, increased right base opacity. There is also mild left retrocardiac airspace disease. IMPRESSION: Endotracheal tube 9.2 cm above carina. This could be advanced 2-3 cm for optimum positioning. Nasogastric tube at the body of the stomach. Side port may be at or just above the gastroesophageal junction. Consider advancement. Worsening bibasilar aeration. atelectasis or developing infection/aspiration. Electronically Signed   By: Abigail Miyamoto M.D.   On: 08/03/2015 09:34   Dg Chest Portable 1  View  08/02/2015  CLINICAL DATA:  Possible sepsis. History of hypertension, emphysema, MI, lung cancer, bronchitis, pneumonia. History of CAD, asthma, and shortness of breath. EXAM: PORTABLE CHEST  1 VIEW COMPARISON:  07/16/2015 FINDINGS: Patient is rotated towards the left. Heart size is normal. There is mild streaky density at the right lung base, stable over prior exams and consistent with atelectasis or scarring. No focal consolidations or pleural effusions. No pulmonary edema. Spinal fixation rods again noted. IMPRESSION: No evidence for acute cardiopulmonary abnormality. Electronically Signed   By: Nolon Nations M.D.   On: 08/02/2015 17:46       Assessment and plan per attending neurologist  Etta Quill PA-C [facility] [phone]  08/03/2015, 10:07 AM  I saw this patient briefly when he was in the process of being intubated. He was able to follow commands in all 4 extremities, pupils round reactive, looks both ways to command. We will need to get a better exam, 1 mL has been stabilized  Assessment/Plan: 70 year old male with respiratory failure for which there is concern for neuromuscular component to dysphagia. He has a progressive tremor which is meant present for many years for which he was evaluated by Dr. Carles Collet. Aldolase, CK, myasthenia panel also been negative.  1) we'll need to perform better exam, history once patient is stabilized 2) agree an EMG would be a possibly beneficial test, but this cannot be performed while inpatient.  Roland Rack, MD [facility] [phone]  If 7pm- 7am, please page neurology on call as listed in [place].

## 2015-08-03 NOTE — Progress Notes (Signed)
MD at bedside. Pt switched to amiodarone gtt with '150mg'$  bolus and '60mg'$  infusion rate/hr. MD also ordered '250mg'$  digoxin. Will consider use of adenosine in one hour if pt still in Aflutter 150's.

## 2015-08-03 NOTE — Progress Notes (Signed)
Pt right forearm IV infiltrated. Site where amiodarone was infusing. Medication moved to other side and zosyn paused to infuse. Attempting another IV site.

## 2015-08-03 NOTE — Progress Notes (Signed)
ABG collected  

## 2015-08-03 NOTE — Progress Notes (Signed)
   [facility] PCCM  ELINK was notified that pt had a cuff leak.  Per RT Collie Siad, pt was pulling low TV.  ET tip was at 23 at the lip.  She advanced ET tube until 25 at the lip with good TV in 500 and no cuff leak.   Pt had good air entry with diminished BS on the L.   CXR stat was reviewed. ET tube tip was around 2-3 cm above the carina.   Monica Becton, MD 08/03/2015, 6:18 PM [facility] Pager [phone] After 3 pm or if no answer, call [phone]

## 2015-08-03 NOTE — Progress Notes (Signed)
    Subjective:  Denies CP or dyspnea   Objective:  Filed Vitals:   08/03/15 0000 08/03/15 0100 08/03/15 0200 08/03/15 0350  BP: 140/79 113/76 113/79 110/79  Pulse: 160 154  146  Temp: 98.6 F (37 C)   99.4 F (37.4 C)  TempSrc: Oral   Oral  Resp: 33 34 31 31  Height: '5\' 10"'$  (1.778 m)     Weight: 161 lb 2.5 oz (73.1 kg)   161 lb 2.5 oz (73.1 kg)  SpO2: 97% 100%  100%    Intake/Output from previous day:  Intake/Output Summary (Last 24 hours) at 08/03/15 0718 Last data filed at 08/03/15 0200  Gross per 24 hour  Intake 2686.93 ml  Output    200 ml  Net 2486.93 ml    Physical Exam: Physical exam: Well-developed chronically ill appearing in mild respiratory distress.  Skin is warm and dry.  HEENT is normal.  Neck is supple. Chest diminished BS throughout Cardiovascular exam is tachycaric Abdominal exam nontender or distended. No masses palpated. Extremities show no edema. neuro answers questions appropriately    Lab Results: Basic Metabolic Panel:  Recent Labs  08/02/15 1744 08/02/15 2353 08/03/15 0215  NA 135  --  137  K 4.2  --  4.0  CL 101  --  106  CO2 23  --  22  GLUCOSE 104*  --  104*  BUN 24*  --  12  CREATININE 0.91  --  0.73  CALCIUM 9.3  --  8.2*  MG  --  1.6*  --    CBC:  Recent Labs  08/02/15 1744 08/03/15 0215  WBC 13.2* 10.4  NEUTROABS 10.9*  --   HGB 11.4* 9.6*  HCT 33.8* 29.9*  MCV 83.9 84.2  PLT 629* 514*     Assessment/Plan:  1 Atrial flutter-patient remains in atrial flutter; chadsvasc 5; however no anticoagulation due to hemoptysis. Continue amiodarone and cardizem; continue metoprolol but change to short acting. Rate now down to 130. Can proceed with DCCV if absolutely necessary but would be high risk for embolic event given not anticoagulated; check echo. 2 pneumonia/bronchiectasis-continue antibiotics. 3 COPD 4 HTN-hold cozaar given need for cardizem and metoprolol for atrial flutter.   Kirk Ruths 08/03/2015,  7:18 AM

## 2015-08-03 NOTE — Procedures (Signed)
Central Venous Catheter Insertion Procedure Note RIAZ ONORATO [id number] March 18, 1945  Procedure: Insertion of Central Venous Catheter Indications: Assessment of intravascular volume, Drug and/or fluid administration and Frequent blood sampling  Procedure Details Consent: Risks of procedure as well as the alternatives and risks of each were explained to the (patient/caregiver).  Consent for procedure obtained. Time Out: Verified patient identification, verified procedure, site/side was marked, verified correct patient position, special equipment/implants available, medications/allergies/relevent history reviewed, required imaging and test results available.  Performed  Maximum sterile technique was used including antiseptics, cap, gloves, gown, hand hygiene, mask and sheet. Skin prep: Chlorhexidine; local anesthetic administered A antimicrobial bonded/coated triple lumen catheter was placed in the right internal jugular vein using the Seldinger technique. Ultrasound guidance used.Yes.   Catheter placed to 17 cm. Blood aspirated via all 3 ports and then flushed x 3. Line sutured x 2 and dressing applied.  Evaluation Blood flow good Complications: No apparent complications Patient did tolerate procedure well. Chest X-ray ordered to verify placement.  CXR: pending.  Richardson Landry Minor ACNP Maryanna Shape PCCM Pager [phone] till 3 pm If no answer page [phone] 08/03/2015, 9:44 AM   [place] amio cvp  .sigsdf

## 2015-08-03 NOTE — Progress Notes (Signed)
Initial Nutrition Assessment  DOCUMENTATION CODES:   Non-severe (moderate) malnutrition in context of chronic illness  INTERVENTION:   Initiate Vital AF 1.2 @ 55 ml/hr.   30 ml Prostat daily.    Tube feeding regimen provides 1684 kcal (96% of needs), 114 grams of protein, and 1070 ml of H2O.   NUTRITION DIAGNOSIS:   Malnutrition (moderate) related to chronic illness (COPD and cancer) as evidenced by mild depletion of body fat, moderate depletions of muscle mass.  GOAL:   Patient will meet greater than or equal to 90% of their needs  MONITOR:   TF tolerance, Weight trends, Vent status, I & O's, Labs  REASON FOR ASSESSMENT:   Consult Enteral/tube feeding initiation and management  ASSESSMENT:   Pt with hx of RLL adenocarcinoma in 2016 Rx with SBRT with decrease in size of nodule, COPD, recurrent aspiration and bronchiectasis in RLL. Pt with multiple admissions in 2017 for recurrent aspiration PNA, on pureed diet at home. Admitted from pulmonary clinic with confusion and tachycardia and intubated.     Patient is currently intubated on ventilator support MV: 7.2 L/min Temp (24hrs), Avg:98.7 F (37.1 C), Min:97.8 F (36.6 C), Max:99.9 F (37.7 C)   Medications reviewed and include: colace, solumedrol, amiodarone, cardizem Labs reviewed Nutrition-Focused physical exam completed. Findings are mild/moderate fat depletion, mild/moderate muscle depletion, and no edema.   Spoke with pt's wife who reports that pt lost weight last year prior to treatment in 12/16. He has since been regaining some of that weight and had been eating well until one week ago. He normally drinks one ensure per day and had recently gone up to 2 last week since he was not eating well.   Diet Order:  Diet NPO time specified  Skin:  Reviewed, no issues  Last BM:  unknown  Height:   Ht Readings from Last 1 Encounters:  08/03/15 '5\' 10"'$  (1.778 m)    Weight:   Wt Readings from Last 1 Encounters:   08/03/15 161 lb 2.5 oz (73.1 kg)    Ideal Body Weight:  75.4 kg  BMI:  Body mass index is 23.12 kg/(m^2).  Estimated Nutritional Needs:   Kcal:  1443  Protein:  105-115 grams  Fluid:  >1.7 L/day  EDUCATION NEEDS:   No education needs identified at this time  [place], [place], [place] Pager [phone] After Hours Pager

## 2015-08-03 NOTE — Consult Note (Signed)
CARDIOLOGY CONSULT NOTE  Assessment and Plan:  *Atrial flutter with rapid ventricular response: Patient is currently in narrow complex tachycardia. EKGs since admission shows heart rate that has been persistent in 150s. EKGs are not completely conclusive but likely represent atrial flutter with rapid ventricular response. 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 He was tried on diltiazem drip then boluses of IV metoprolol without any change in his heart rate.  The patient has history of lung cancer. He presented with symptoms of subjective fevers, shortness of breath, increasing sputum production which was yellow yesterday. Per wife and the ED M.D., patient was having episodes of bright red blood with sputum consistent with hemoptysis. So far, in our cardiac ICU patient has not had any further hemoptysis.  - We'll give 250 g of digoxin. We'll start amiodarone drip for rate control. If the rate control strategy does not work, we may have to consider cardioversion and/or ablation - Anticoagulation is somewhat challenging due to recent hemoptysis. He is currently not on any systemic anticoagulation. - Continue metoprolol tartrate to 25 mg every 6 hours  Chief complaint: Atrial flutter   HPI:  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 Patient is unable to provide any relevant medical history at this point. Most of this history is obtained by reviewing charts. Patient her past 2-3 days has had progressive cough with increasing sputum  production, which was initially yellow but now is blood-tinged. There are reports of frank hemoptysis in the emergency department. In the emergency department, patient was found to be in narrow complex tachycardia with a rate of 150s. He was started on diltiazem drip with no significant change in his heart rate. He was given intravenous metoprolol again without any changes in his heart rate. Cardiology is now consulted for management of narrow complex tachycardia.  Of note, patient follows [facility] for cardiology. Patient has history of mild LV dysfunction with combined systolic and diastolic heart failure. He underwent a nuclear medicine perfusion scan in May that showed no significant evidence of ischemia.  .Past Medical History Past Medical History  Diagnosis Date  . Hyperlipidemia     takes Simvastatin daily  . Tremor   . History of prostate cancer 2004  . On home O2   . Neuromuscular disorder ([place]) 1998    right carpal tunnel release  . Anemia associated with acute blood loss   . GERD (gastroesophageal reflux disease)     takes Omeprazole daily  . Hypertension     takes Metoprolol daily as well as Hyzaar  . Constipation     takes Colace daily as well as Miralax  . Anxiety     takes Valium daily  . Depression     takes Cymbalta daily  . Emphysema of lung (H[facility])     Albuterol as needed;Symbicort daily and Singulair at bedtime  . Myocardial infarction ([place]) 04/1998  . Coronary artery disease   . Asthma   . Shortness of breath     with exertion  . History of bronchitis   . Aspiration pneumonia ([place]) 2010  . Headache, chronic daily   . History of  migraine     last migraine a couple of days ago;takes Excedrin Migraine  . Chronic back pain     compression fracture  . History of colon polyps   . Mood change ([place])     after Brain surgery mood changed and was placed on Depakote  . Insomnia     takes Benadryl nightly  . 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 Lung cancer ([place])  2016    Allergies: Allergies  Allergen Reactions  . Lisinopril Swelling    ANGIOEDEMA  . Lamictal [Lamotrigine] Other (See Comments)    Weakness/difficulty swallowing  . Ambien [Zolpidem] Other (See Comments)    Unknown reaction    Social History Social History   Social History  . Marital Status: Married    Spouse Name: N/A  . Number of Children: N/A  . Years of Education: N/A   Occupational History  . Not on file.   Social History Main Topics  . Smoking status: Former Smoker -- 1.50 packs/day for 52 years    Types: Cigarettes    Quit date: 07/17/2012  . Smokeless tobacco: Never Used     Comment: Former smoker  . Alcohol Use: No  . Drug Use: Yes    Special: [place]  . Sexual Activity: Yes   Other Topics Concern  . Not on file   Social History Narrative    Family History Family History  Problem Relation Age of Onset  . Heart disease Mother   . Cancer Father     brain cancer and prostate cancer     Physical Exam Filed Vitals:   08/02/15 2330 08/03/15 0000  BP: 132/85 140/79  Pulse: 158 160  Temp: 98.6 F (37 C)   Resp: 34 33   Gen:Tachypneic, appears to be confused HEENT: Extraocular motions intact, poor dentition Neck: Supple, elevated JVP, no carotid bruits CV: Tachycardic, regular loud high-pitched S1 and S2, no obvious murmurs although cardiac exam limited by breath sounds Pulm: Mild rhonchi throughout, tachypneic Abdomen: Soft, nontender, nondistended Ext: No cyanosis clubbing or edema  Labs:  Results for orders placed or performed during the hospital encounter of 08/02/15 (from the past 24 hour(s))  Comprehensive metabolic panel     Status: Abnormal   Collection Time: 08/02/15  5:44 PM  Result Value Ref Range   Sodium 135 135 - 145 mmol/L   Potassium 4.2 3.5 - 5.1 mmol/L   Chloride 101 101 - 111 mmol/L   CO2 23 22 - 32 mmol/L   Glucose, Bld 104 (H) 65 - 99 mg/dL   BUN 24 (H) 6 - 20 mg/dL   Creatinine, Ser 0.91 0.61 - 1.24 mg/dL    Calcium 9.3 8.9 - 10.3 mg/dL   Total Protein 8.5 (H) 6.5 - 8.1 g/dL   Albumin 3.4 (L) 3.5 - 5.0 g/dL   AST 18 15 - 41 U/L   ALT 13 (L) 17 - 63 U/L   Alkaline Phosphatase 53 38 - 126 U/L   Total Bilirubin 0.6 0.3 - 1.2 mg/dL   GFR calc non Af Amer >60 >60 mL/min   GFR calc Af Amer >60 >60 mL/min   Anion gap 11 5 - 15  CBC WITH DIFFERENTIAL     Status: Abnormal   Collection Time: 08/02/15  5:44 PM  Result Value Ref Range   WBC 13.2 (H) 4.0 - 10.5 K/uL   RBC 4.03 (L) 4.22 - 5.81 MIL/uL   Hemoglobin 11.4 (L) 13.0 - 17.0 g/dL  HCT 33.8 (L) 39.0 - 52.0 %   MCV 83.9 78.0 - 100.0 fL   MCH 28.3 26.0 - 34.0 pg   MCHC 33.7 30.0 - 36.0 g/dL   RDW 16.2 (H) 11.5 - 15.5 %   Platelets 629 (H) 150 - 400 K/uL   Neutrophils Relative % 82 %   Neutro Abs 10.9 (H) 1.7 - 7.7 K/uL   Lymphocytes Relative 10 %   Lymphs Abs 1.3 0.7 - 4.0 K/uL   Monocytes Relative 8 %   Monocytes Absolute 1.0 0.1 - 1.0 K/uL   Eosinophils Relative 0 %   Eosinophils Absolute 0.0 0.0 - 0.7 K/uL   Basophils Relative 0 %   Basophils Absolute 0.0 0.0 - 0.1 K/uL  I-Stat CG4 Lactic Acid, ED  (not at  [facility])     Status: None   Collection Time: 08/02/15  5:55 PM  Result Value Ref Range   Lactic Acid, Venous 1.32 0.5 - 2.0 mmol/L  Valproic acid level     Status: Abnormal   Collection Time: 08/02/15  7:44 PM  Result Value Ref Range   Valproic Acid Lvl 25 (L) 50.0 - 100.0 ug/mL  Urinalysis, Routine w reflex microscopic (not at [facility])     Status: None   Collection Time: 08/02/15  8:07 PM  Result Value Ref Range   Color, Urine YELLOW YELLOW   APPearance CLEAR CLEAR   Specific Gravity, Urine 1.029 1.005 - 1.030   pH 6.0 5.0 - 8.0   Glucose, UA NEGATIVE NEGATIVE mg/dL   Hgb urine dipstick NEGATIVE NEGATIVE   Bilirubin Urine NEGATIVE NEGATIVE   Ketones, ur NEGATIVE NEGATIVE mg/dL   Protein, ur NEGATIVE NEGATIVE mg/dL   Nitrite NEGATIVE NEGATIVE   Leukocytes, UA NEGATIVE NEGATIVE  I-Stat CG4 Lactic Acid, ED  (not at  [facility])      Status: None   Collection Time: 08/02/15  9:55 PM  Result Value Ref Range   Lactic Acid, Venous 1.38 0.5 - 2.0 mmol/L  Lactic acid, plasma     Status: None   Collection Time: 08/02/15 11:53 PM  Result Value Ref Range   Lactic Acid, Venous 1.2 0.5 - 2.0 mmol/L  Procalcitonin     Status: None   Collection Time: 08/02/15 11:53 PM  Result Value Ref Range   Procalcitonin <0.10 ng/mL  Protime-INR     Status: Abnormal   Collection Time: 08/02/15 11:53 PM  Result Value Ref Range   Prothrombin Time 16.3 (H) 11.6 - 15.2 seconds   INR 1.29 0.00 - 1.49  APTT     Status: Abnormal   Collection Time: 08/02/15 11:53 PM  Result Value Ref Range   aPTT 38 (H) 24 - 37 seconds  Magnesium     Status: Abnormal   Collection Time: 08/02/15 11:53 PM  Result Value Ref Range   Magnesium 1.6 (L) 1.7 - 2.4 mg/dL

## 2015-08-03 NOTE — Progress Notes (Signed)
RT called to patient's room by RN with a possible cuff leak. The patient's Vte was as low as 68, with a Vti of 580. The cuff leak was audible and you could feel the air escaping from the patient's mouth, but the ET tube was still secured at 23 cm. RT attempted to add air to cuff without any positive results. E Link called for a possible ruptured cuff. As Elink notified, the RN checked the earlier [facility] which showed the ETT was 9.3 cm above the carina. RT deflated the cuff and inserted the ETT  2 cm to 25. The leak stopped, the Vte recovered to 601, bilateral breath sounds, and chest rise. ELink called to notify, and they stated Dr. Corrie Dandy was paged to come and access.  Dr. Corrie Dandy arrived, accessed the patient and ordered a stat CRX which is pending.

## 2015-08-03 NOTE — Progress Notes (Signed)
Scheduled neb not given, contraindicated. Pt Heart Rate is significantly high in the 160's. Pt is on a venti mask tolerating it well.

## 2015-08-03 NOTE — Progress Notes (Signed)
FIO2 titrated after ABG results.

## 2015-08-03 NOTE — Procedures (Signed)
Intubation Procedure Note Malik Zuniga [id number] 05-17-1945  Procedure: Intubation Indications: Respiratory insufficiency  Procedure Details Consent: Risks of procedure as well as the alternatives and risks of each were explained to the (patient/caregiver).  Consent for procedure obtained. Time Out: Verified patient identification, verified procedure, site/side was marked, verified correct patient position, special equipment/implants available, medications/allergies/relevent history reviewed, required imaging and test results available.  Performed  Maximum sterile technique was used including antiseptics, cap, gloves, gown, hand hygiene and mask.  MAC and 4    Evaluation Hemodynamic Status: BP stable throughout; O2 sats: stable throughout Patient's Current Condition: stable Complications: No apparent complications Patient did tolerate procedure well. Chest X-ray ordered to verify placement.  CXR: pending.   Raylene Miyamoto. 08/03/2015  Pus all over cords Aspiration likely prior  Lavon Paganini. Titus Mould, MD, [place] Pgr: [facility]

## 2015-08-03 NOTE — Care Management Important Message (Signed)
Important Message  Patient Details  Name: Malik Zuniga MRN: [id number] Date of Birth: Jan 12, 1946   Medicare Important Message Given:  Yes    [name] 08/03/2015, 9:43 AM

## 2015-08-03 NOTE — Progress Notes (Signed)
Pt still in Afib/flutter 150's. Amiodarone and digoxin ineffective. MD ordered PO metoprolol '25mg'$ . Will potentially treat with adenosine if rhythm continues.

## 2015-08-03 NOTE — Consult Note (Addendum)
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 He was formally followed by Dr. Gwenette Zuniga and is now followed by Dr. [place] Zuniga in the pulmonary clinic.  He was sent in for the pulmonary clinic today where he was noted to be confused and tachycardic. He reports 4 days of coughing up.brown mucus and hemoptysis of blood tinged sputum appearing pink or the last 24 hours multiple episodes. This history is confirmed by his wife. He reports feeling warm and sweats but no fever was recorded. He was prescribed Augmentin which she took he has taken this in the past when he needed prolonged antibiotics. He has tried to be compliant with a pured diet  In the ED was noted to have a lactate of 1.3 and a white count of 13.2 chest x-ray showed no clear infiltrates and a mild right lower lobe platelike atelectasis He was given cefepime and vancomycin and started on Cardizem for atrial fibrillation and RVR    He was treated with SB RT for right lower lobe adenocarcinoma in 2016 with decrease in the size of the nodule and a follow-up scan in 04/2015 He was admitted multiple times in 2017 for recurrent aspiration pneumonia and last scan from 04/2015 shows improved left lower lobe infiltrate.  His spirometry from 04/2009 shows FEV1 of 1.65-47% with a ratio 49 suggesting stage III COPD  SIGNIFICANT EVENTS distress, on venti  STUDIES:  04/2015 CT chest - Decrease in size of a right lower lobe adenocarcinoma with changes of radiation therapy in the adjacent right lung. Improving left lower lobe consolidation with a residual small left pleural effusion.   SUBJECTIVE: distress on venti mask, tachy remains  VITAL SIGNS: Temp:   [97.8 F (36.6 C)-99.9 F (37.7 C)] 97.8 F (36.6 C) (06/23 0800) Pulse Rate:  [76-160] 142 (06/23 0800) Resp:  [14-34] 25 (06/23 0800) BP: (102-164)/(64-106) 102/64 mmHg (06/23 0800) SpO2:  [88 %-100 %] 99 % (06/23 0800) FiO2 (%):  [50 %] 50 % (06/23 0000) Weight:  [73.1 kg (161 lb 2.5 oz)-74.39 kg (164 lb)] 73.1 kg (161 lb 2.5 oz) (06/23 0350)  PHYSICAL EXAMINATION: General: lethargic distress Neuro- perr, weak, not repsonding to verbal stimuli HEENT:  jvd  Cardiovascular:  S1 and S2 tachycardia and irregular, no murmur Lungs:  Decreased breath sounds bilateteral coarse Abdomen:  Soft and nontender Musculoskeletal:  Good pulses, no edema   Recent Labs Lab 08/02/15 1744 08/03/15 0215  NA 135 137  K 4.2 4.0  CL 101 106  CO2 23 22  BUN 24* 12  CREATININE 0.91 0.73  GLUCOSE 104* 104*    Recent Labs Lab 08/02/15 1744 08/03/15 0215  HGB 11.4* 9.6*  HCT 33.8* 29.9*  WBC 13.2* 10.4  PLT 629* 514*   Dg Chest Portable 1 View  08/02/2015  CLINICAL DATA:  Possible sepsis. History of hypertension, emphysema, MI, lung cancer, bronchitis, pneumonia. History of CAD, asthma, and shortness of breath. EXAM: PORTABLE CHEST 1 VIEW COMPARISON:  07/16/2015 FINDINGS: Patient is rotated towards the left. Heart size is normal. There is mild streaky density at the right lung base, stable over prior exams and consistent with atelectasis or scarring. No focal consolidations or pleural effusions. No pulmonary edema. Spinal fixation  rods again noted. IMPRESSION: No evidence for acute cardiopulmonary abnormality. Electronically Signed   By: Malik Zuniga M.D.   On: 08/02/2015 17:46    ASSESSMENT / PLAN:  Acute exacerbation of bronchiectasis versus another aspiration episode Hemoptysis (bronchiectaisis, infection?) - aspiration ? R/o neuro resp failure contribution? Malik Zuniga? Cancer present treated Lung cancer s/p radiation COPD exacerbation Acute encephalopathy Atrial  fibrillation/RVR Recent work up myopathy -STAT ABG -move to icu, requires stat  Intubation - 8 cc kg, peep 5 100%, rate 14, will get follow up abg -if blood sig noted would bronch, want to avoid -not moving airway well, steroids -BDers -dc Singulair -CT chest likely Saturday or earlier if stabilized and hemoptysis noted -his resp failure is driving his afib rvr for sure, re assess HR control then -may need further amio bolus -may need cvp assessment -ensure ppi, SSI with steroids -maintain current abx regimen -start feeds early -re assess neurostatus, if not improved after ett, ct head and maybe eeg as recent neuro work up in progress -may need neuro consult, myopathy contribution?? Ensure tsh  I have updated wife at bedside in full   Ccm time 30 min  Malik Zuniga. Malik Mould, MD, [place] Pgr: [facility]

## 2015-08-04 DIAGNOSIS — I5033 Acute on chronic diastolic (congestive) heart failure: Secondary | ICD-10-CM | POA: Diagnosis present

## 2015-08-04 DIAGNOSIS — I4892 Unspecified atrial flutter: Secondary | ICD-10-CM

## 2015-08-04 DIAGNOSIS — J9601 Acute respiratory failure with hypoxia: Secondary | ICD-10-CM

## 2015-08-04 DIAGNOSIS — J690 Pneumonitis due to inhalation of food and vomit: Secondary | ICD-10-CM

## 2015-08-04 DIAGNOSIS — J189 Pneumonia, unspecified organism: Secondary | ICD-10-CM

## 2015-08-04 DIAGNOSIS — J9600 Acute respiratory failure, unspecified whether with hypoxia or hypercapnia: Secondary | ICD-10-CM | POA: Diagnosis present

## 2015-08-04 DIAGNOSIS — I10 Essential (primary) hypertension: Secondary | ICD-10-CM

## 2015-08-04 LAB — GLUCOSE, CAPILLARY
GLUCOSE-CAPILLARY: 134 mg/dL — AB (ref 65–99)
Glucose-Capillary: 128 mg/dL — ABNORMAL HIGH (ref 65–99)
Glucose-Capillary: 123 mg/dL — ABNORMAL HIGH (ref 65–99)
Glucose-Capillary: 163 mg/dL — ABNORMAL HIGH (ref 65–99)
Glucose-Capillary: 161 mg/dL — ABNORMAL HIGH (ref 65–99)
Glucose-Capillary: 133 mg/dL — ABNORMAL HIGH (ref 65–99)

## 2015-08-04 LAB — CBC WITH DIFFERENTIAL/PLATELET
BASOS ABS: 0 10*3/uL (ref 0.0–0.1)
BASOS PCT: 0 %
EOS PCT: 0 %
Eosinophils Absolute: 0 10*3/uL (ref 0.0–0.7)
HCT: 25.3 % — ABNORMAL LOW (ref 39.0–52.0)
Hemoglobin: 8.1 g/dL — ABNORMAL LOW (ref 13.0–17.0)
Lymphocytes Relative: 7 %
Lymphs Abs: 0.6 10*3/uL — ABNORMAL LOW (ref 0.7–4.0)
MCH: 27.1 pg (ref 26.0–34.0)
MCHC: 32 g/dL (ref 30.0–36.0)
MCV: 84.6 fL (ref 78.0–100.0)
MONO ABS: 0.2 10*3/uL (ref 0.1–1.0)
Monocytes Relative: 3 %
Neutro Abs: 7.3 10*3/uL (ref 1.7–7.7)
Neutrophils Relative %: 90 %
PLATELETS: 421 10*3/uL — AB (ref 150–400)
RBC: 2.99 MIL/uL — ABNORMAL LOW (ref 4.22–5.81)
RDW: 16.2 % — AB (ref 11.5–15.5)
WBC: 8.1 10*3/uL (ref 4.0–10.5)

## 2015-08-04 LAB — BASIC METABOLIC PANEL
Anion gap: 7 (ref 5–15)
BUN: 8 mg/dL (ref 6–20)
CALCIUM: 7.9 mg/dL — AB (ref 8.9–10.3)
CO2: 22 mmol/L (ref 22–32)
Chloride: 106 mmol/L (ref 101–111)
Creatinine, Ser: 0.67 mg/dL (ref 0.61–1.24)
GFR calc Af Amer: >60 mL/min (ref >60)
GLUCOSE: 128 mg/dL — AB (ref 65–99)
Potassium: 4.1 mmol/L (ref 3.5–5.1)
Sodium: 135 mmol/L (ref 135–145)

## 2015-08-04 LAB — URINE CULTURE: Culture: NO GROWTH

## 2015-08-04 LAB — MAGNESIUM
MAGNESIUM: 1.8 mg/dL (ref 1.7–2.4)
Magnesium: 1.8 mg/dL (ref 1.7–2.4)

## 2015-08-04 LAB — PHOSPHORUS
PHOSPHORUS: 1.9 mg/dL — AB (ref 2.5–4.6)
Phosphorus: 2.1 mg/dL — ABNORMAL LOW (ref 2.5–4.6)

## 2015-08-04 MED ORDER — LEVALBUTEROL HCL 0.63 MG/3ML IN NEBU
0.6300 mg | INHALATION_SOLUTION | Freq: Four times a day (QID) | RESPIRATORY_TRACT | Status: DC
  Administered 2015-08-05 – 2015-08-08 (×13): 0.63 mg via RESPIRATORY_TRACT
  Filled 2015-08-04 (×16): qty 3

## 2015-08-04 MED ORDER — LEVALBUTEROL HCL 0.63 MG/3ML IN NEBU: 0.6300 mg | INHALATION_SOLUTION | Freq: Three times a day (TID) | RESPIRATORY_TRACT | Status: DC

## 2015-08-04 MED ORDER — DOCUSATE SODIUM 100 MG PO CAPS
200.0000 mg | ORAL_CAPSULE | Freq: Every day | ORAL | Status: DC
  Administered 2015-08-04 – 2015-08-08 (×5): 200 mg via ORAL
  Filled 2015-08-04 (×5): qty 2

## 2015-08-04 MED ORDER — SODIUM CHLORIDE 0.9 % IV SOLN: INTRAVENOUS | Status: DC

## 2015-08-04 MED ORDER — CETYLPYRIDINIUM CHLORIDE 0.05 % MT LIQD
7.0000 mL | Freq: Two times a day (BID) | OROMUCOSAL | Status: DC
  Administered 2015-08-06 – 2015-08-09 (×5): 7 mL via OROMUCOSAL

## 2015-08-04 MED ORDER — METOPROLOL TARTRATE 25 MG PO TABS
25.0000 mg | ORAL_TABLET | Freq: Two times a day (BID) | ORAL | Status: DC
  Administered 2015-08-04 – 2015-08-05 (×2): 25 mg via ORAL
  Filled 2015-08-04 (×2): qty 1

## 2015-08-04 MED ORDER — METOPROLOL TARTRATE 25 MG/10 ML ORAL SUSPENSION
25.0000 mg | Freq: Two times a day (BID) | ORAL | Status: DC
  Administered 2015-08-04: 25 mg
  Filled 2015-08-04: qty 10

## 2015-08-04 MED ORDER — MORPHINE SULFATE (PF) 2 MG/ML IV SOLN
1.0000 mg | INTRAVENOUS | Status: DC | PRN
  Administered 2015-08-06: 2 mg via INTRAVENOUS
  Filled 2015-08-04: qty 1

## 2015-08-04 MED ORDER — DIVALPROEX SODIUM 125 MG PO CSDR
500.0000 mg | DELAYED_RELEASE_CAPSULE | Freq: Two times a day (BID) | ORAL | Status: DC
  Administered 2015-08-04 – 2015-08-06 (×4): 500 mg via ORAL
  Filled 2015-08-04 (×4): qty 4

## 2015-08-04 MED ORDER — LEVALBUTEROL HCL 0.63 MG/3ML IN NEBU: 0.6300 mg | INHALATION_SOLUTION | Freq: Four times a day (QID) | RESPIRATORY_TRACT | Status: DC | PRN

## 2015-08-04 MED ORDER — IPRATROPIUM BROMIDE 0.02 % IN SOLN
0.5000 mg | Freq: Four times a day (QID) | RESPIRATORY_TRACT | Status: DC
  Administered 2015-08-05 – 2015-08-08 (×13): 0.5 mg via RESPIRATORY_TRACT
  Filled 2015-08-04 (×16): qty 2.5

## 2015-08-04 MED ORDER — IPRATROPIUM BROMIDE 0.02 % IN SOLN: 0.5000 mg | Freq: Three times a day (TID) | RESPIRATORY_TRACT | Status: DC

## 2015-08-04 MED ORDER — ARIPIPRAZOLE 5 MG PO TABS
5.0000 mg | ORAL_TABLET | Freq: Every day | ORAL | Status: DC
  Administered 2015-08-04 – 2015-08-09 (×6): 5 mg via ORAL
  Filled 2015-08-04 (×6): qty 1

## 2015-08-04 MED ORDER — PANTOPRAZOLE SODIUM 40 MG PO PACK: 40.0000 mg | PACK | Freq: Every day | ORAL | Status: DC

## 2015-08-04 MED ORDER — SODIUM PHOSPHATES 45 MMOLE/15ML IV SOLN
30.0000 mmol | Freq: Once | INTRAVENOUS | Status: AC
  Administered 2015-08-04: 30 mmol via INTRAVENOUS
  Filled 2015-08-04: qty 10

## 2015-08-04 MED ORDER — PANTOPRAZOLE SODIUM 40 MG PO TBEC
40.0000 mg | DELAYED_RELEASE_TABLET | Freq: Every day | ORAL | Status: DC
  Administered 2015-08-04 – 2015-08-09 (×6): 40 mg via ORAL
  Filled 2015-08-04 (×6): qty 1

## 2015-08-04 MED ORDER — CHLORHEXIDINE GLUCONATE 0.12 % MT SOLN
15.0000 mL | Freq: Two times a day (BID) | OROMUCOSAL | Status: DC
  Administered 2015-08-04 – 2015-08-09 (×9): 15 mL via OROMUCOSAL
  Filled 2015-08-04 (×8): qty 15

## 2015-08-04 MED ORDER — METOPROLOL TARTRATE 5 MG/5ML IV SOLN
5.0000 mg | Freq: Four times a day (QID) | INTRAVENOUS | Status: DC | PRN
  Administered 2015-08-04: 5 mg via INTRAVENOUS
  Filled 2015-08-04: qty 5

## 2015-08-04 MED ORDER — LEVALBUTEROL HCL 0.63 MG/3ML IN NEBU: 0.6300 mg | INHALATION_SOLUTION | RESPIRATORY_TRACT | Status: DC | PRN

## 2015-08-04 NOTE — Clinical Documentation Improvement (Signed)
Pulmonology  Please update your documentation within the medical record to reflect your response to this query. Thank you  Can the diagnosis of Malnutrition be further specified & documented per MD progress note?   Document Severity - Severe(third degree), Moderate (second degree), Mild (first degree)  Other condition  Unable to clinically determine  Document any associated diagnoses/conditions  Supporting Information: :  08/03/15 Nutrition eval with nutrition dx .Marland KitchenMarland Kitchen"Malnutrition (moderate) related to chronic illness (COPD and cancer) as evidenced by mild depletion of body fat, moderate depletions of muscle mass..." See full evaluation for full assessment & intervention  Please exercise your independent, professional judgment when responding. A specific answer is not anticipated or expected.  Thank You, Ermelinda Das, RN, BSN, [place] Certified Clinical Documentation Specialist [place]: Health Information Management [phone]

## 2015-08-04 NOTE — Progress Notes (Signed)
Name: Malik Zuniga MRN: [id number] DOB: Jul 23, 1945    ADMISSION DATE:  08/02/2015 CONSULTATION DATE:  08/04/2015  REFERRING MD :  [name], EDP  CHIEF COMPLAINT:  Sent from the clinic for confusion, tachycardia   HISTORY OF PRESENT ILLNESS:  70 year old man with COPD, recurrent aspiration and bronchiectasis in the right lower lobe. He was formally followed by Dr. [name] and is now followed by Dr. [name] in the pulmonary clinic.  He was sent in for the pulmonary clinic today where he was noted to be confused and tachycardic. He reports 4 days of coughing up.brown mucus and hemoptysis of blood tinged sputum appearing pink or the last 24 hours multiple episodes. This history is confirmed by his wife. He reports feeling warm and sweats but no fever was recorded. He was prescribed Augmentin which she took he has taken this in the past when he needed prolonged antibiotics. He has tried to be compliant with a pured diet  In the ED was noted to have a lactate of 1.3 and a white count of 13.2 chest x-ray showed no clear infiltrates and a mild right lower lobe platelike atelectasis He was given cefepime and vancomycin and started on Cardizem for atrial fibrillation and RVR    He was treated with SB RT for right lower lobe adenocarcinoma in 2016 with decrease in the size of the nodule and a follow-up scan in 04/2015 He was admitted multiple times in 2017 for recurrent aspiration pneumonia and last scan from 04/2015 shows improved left lower lobe infiltrate.  His spirometry from 04/2009 shows FEV1 of 1.65-47% with a ratio 49 suggesting stage III COPD  SIGNIFICANT EVENTS  6/23 intubated  STUDIES:  04/2015 CT chest - Decrease in size of a right lower lobe adenocarcinoma with changes of radiation therapy in the adjacent right lung. Improving left lower lobe consolidation with a residual small left pleural effusion.   SUBJECTIVE:  Afebrile On fent gtt, comfortable -denies pain   VITAL  SIGNS: Temp:  [97.8 F (36.6 C)-99.5 F (37.5 C)] 98.5 F (36.9 C) (06/24 0329) Pulse Rate:  [92-145] 92 (06/24 0749) Resp:  [14-25] 16 (06/24 0749) BP: (84-142)/(53-91) 87/64 mmHg (06/24 0749) SpO2:  [93 %-100 %] 99 % (06/24 0749) FiO2 (%):  [40 %-100 %] 40 % (06/24 0749) Weight:  [154 lb 5.2 oz (70 kg)] 154 lb 5.2 oz (70 kg) (06/24 0246)  PHYSICAL EXAMINATION: General: chr ill appearing, slouched to left Neuro-  Weak,follows commands, easily arousable HEENT:  jvd  Cardiovascular:  S1 and S2 tachycardia and irregular, no murmur Lungs:  Decreased breath sounds bilateteral coarse Abdomen:  Soft and nontender Musculoskeletal:  Good pulses, no edema   Recent Labs Lab 08/02/15 1744 08/03/15 0215 08/04/15 0330  NA 135 137 135  K 4.2 4.0 4.1  CL 101 106 106  CO2 '23 22 22  '$ BUN 24* 12 8  CREATININE 0.91 0.73 0.67  GLUCOSE 104* 104* 128*    Recent Labs Lab 08/02/15 1744 08/03/15 0215 08/04/15 0330  HGB 11.4* 9.6* 8.1*  HCT 33.8* 29.9* 25.3*  WBC 13.2* 10.4 8.1  PLT 629* 514* 421*   Dg Chest Port 1 View  08/03/2015  CLINICAL DATA:  Intubated EXAM: PORTABLE CHEST 1 VIEW COMPARISON:  08/03/2015 FINDINGS: Cardiomediastinal silhouette is stable. There is endotracheal tube in place with tip 6.6 cm above the carina. Stable NG tube position. Stable right IJ central line position. No pulmonary edema. There is streaky left basilar atelectasis or infiltrate. No pneumothorax. Please  note the left apex is obscured by patient's overlying chin. IMPRESSION: There is endotracheal tube in place with tip 6.6 cm above the carina. Stable NG tube position. Stable right IJ central line position. No pulmonary edema. There is streaky left basilar atelectasis or infiltrate. No pneumothorax. Electronically Signed   By: Lahoma Crocker M.D.   On: 08/03/2015 18:37   Dg Chest Port 1 View  08/03/2015  CLINICAL DATA:  Central line placement. EXAM: PORTABLE CHEST 1 VIEW COMPARISON:  Earlier today at 0919 hours.  FINDINGS: endotracheal tube remains high, 9.9 cm above the carina. Nasogastric extends beyond the inferior aspect of the film. Right internal jugular line tip at mid SVC. Normal heart size. No pleural effusion or pneumothorax. Patchy bibasilar airspace disease. IMPRESSION: Right internal jugular line appropriately positioned without pneumothorax. Endotracheal tube remains somewhat high and could be advanced 3-4 cm. Persistent bibasilar airspace disease. Electronically Signed   By: Abigail Miyamoto M.D.   On: 08/03/2015 10:22   Dg Chest Port 1 View  08/03/2015  CLINICAL DATA:  Intubation and nasogastric tube placement. EXAM: PORTABLE CHEST 1 VIEW COMPARISON:  08/02/2015 FINDINGS: Two frontal views of the chest. Endotracheal tube is difficult to visualize centrally but is felt to terminate 9.2 cm above the carina, well below the level of the clavicles. Nasogastric tube terminates at the body of the stomach, but the side-port is not visualized and may be at or just above the gastroesophageal junction. thoracolumbar spine fixation hardware and multiple overlying wires and leads. Patient rotated left. Normal heart size. No pleural effusion or pneumothorax. Underlying hyperinflation and bibasilar interstitial thickening. Especially compared to 07/16/15, increased right base opacity. There is also mild left retrocardiac airspace disease. IMPRESSION: Endotracheal tube 9.2 cm above carina. This could be advanced 2-3 cm for optimum positioning. Nasogastric tube at the body of the stomach. Side port may be at or just above the gastroesophageal junction. Consider advancement. Worsening bibasilar aeration. atelectasis or developing infection/aspiration. Electronically Signed   By: Abigail Miyamoto M.D.   On: 08/03/2015 09:34   Dg Chest Portable 1 View  08/02/2015  CLINICAL DATA:  Possible sepsis. History of hypertension, emphysema, MI, lung cancer, bronchitis, pneumonia. History of CAD, asthma, and shortness of breath. EXAM:  PORTABLE CHEST 1 VIEW COMPARISON:  07/16/2015 FINDINGS: Patient is rotated towards the left. Heart size is normal. There is mild streaky density at the right lung base, stable over prior exams and consistent with atelectasis or scarring. No focal consolidations or pleural effusions. No pulmonary edema. Spinal fixation rods again noted. IMPRESSION: No evidence for acute cardiopulmonary abnormality. Electronically Signed   By: Nolon Nations M.D.   On: 08/02/2015 17:46    ASSESSMENT / PLAN:  Acute resp failure -  SBTs with goal extubation   Acute exacerbation of bronchiectasis versus another aspiration episode -ct broad-spectrum antibiotics -Obtain sputum culture and tailor antibiotics accordingly -swallow eval after  Hemoptysis, likely related to above - should improve with antibiotics, if worsens, consider adding antitussives containing codeine   COPD-no evidence of exacerbation,-He quit smoking in 2014 -Continue Pulmicort and DuoNeb nebs   Acute encephalopathy - very likely related to sepsis, observe for now Chronic pain/ bipolar - hold meds until able to take PO  Atrial fibrillation/RVR - new onset, although he states that Dr. Tamala Julian has noted this in the past , - recent nuclear stress test was a low-risk study with EF of 48%  - he is not a candidate for anti-coag ablation currently due to hemoptysis ,  can consider once this subsides within the next 24 hours  - continue Cardizem drip  - ct rhythm control with amiodarone    The patient is critically ill with multiple organ systems failure and requires high complexity decision making for assessment and support, frequent evaluation and titration of therapies, application of advanced monitoring technologies and extensive interpretation of multiple databases. Critical Care Time devoted to patient care services described in this note independent of APP time is 35 minutes.   Kara Mead MD. [place] Flood. [facility] Pager [phone] If no response call [phone]   08/04/2015

## 2015-08-04 NOTE — Procedures (Signed)
Extubation Procedure Note  Patient Details:   Name: Malik Zuniga DOB: 1946/01/30 MRN: [id number]   Airway Documentation:     Evaluation  O2 sats: stable throughout Complications: No apparent complications Patient did tolerate procedure well. Bilateral Breath Sounds: Clear, Diminished   Yes   Positive cuff leak noted prior to extubation. Patient placed on nasal cannula 4 L with humidity, no stridor noted. Patient able to get 1600 ml on incentive spirometer. ELink notified.  Bayard Beaver 08/04/2015, 9:21 AM

## 2015-08-04 NOTE — Progress Notes (Signed)
    Subjective:  The patient developed acute respiratory distress --> respiratory failure the last night requiring intubation. He was just extubated. Denies chest pain.  Objective:  Filed Vitals:   08/04/15 0700 08/04/15 0749 08/04/15 0800 08/04/15 0827  BP: 83/51 87/64 105/74   Pulse:  92    Temp:    97.9 F (36.6 C)  TempSrc:    Oral  Resp: '16 16 19   '$ Height:      Weight:      SpO2: 97% 99% 98%     Intake/Output from previous day:  Intake/Output Summary (Last 24 hours) at 08/04/15 0912 Last data filed at 08/04/15 0700  Gross per 24 hour  Intake 9810.47 ml  Output    785 ml  Net 9025.47 ml    Physical Exam: Physical exam: Well-developed chronically ill appearing in mild respiratory distress.  Skin is warm and dry.  HEENT is normal.  Neck is supple. Chest diminished BS throughout Cardiovascular exam is tachycaric Abdominal exam nontender or distended. No masses palpated. Extremities show no edema. neuro answers questions appropriately    Lab Results: Basic Metabolic Panel:  Recent Labs  08/03/15 0215  08/03/15 2045 08/04/15 0330 08/04/15 0750  NA 137  --   --  135  --   K 4.0  --   --  4.1  --   CL 106  --   --  106  --   CO2 22  --   --  22  --   GLUCOSE 104*  --   --  128*  --   BUN 12  --   --  8  --   CREATININE 0.73  --   --  0.67  --   CALCIUM 8.2*  --   --  7.9*  --   MG  --   < > 1.7  --  1.8  PHOS  --   < > 2.6  --  2.1*  < > = values in this interval not displayed. CBC:  Recent Labs  08/02/15 1744 08/03/15 0215 08/04/15 0330  WBC 13.2* 10.4 8.1  NEUTROABS 10.9*  --  7.3  HGB 11.4* 9.6* 8.1*  HCT 33.8* 29.9* 25.3*  MCV 83.9 84.2 84.6  PLT 629* 514* 421*     Assessment/Plan:  1 Atrial flutter-patient remains in atrial flutter; chadsvasc 5; however no anticoagulation due to hemoptysis. Continue amiodarone and cardizem; he was just extubated, while intubated BP low sec to sedation now 139/82, we will go up on Cardizem, continue  amiodarone and add metoprolol 5 mg iv Q6H PRN. Rate in 130-140 Can proceed with DCCV if absolutely necessary but would be high risk for embolic event given not anticoagulated; check echo. 2 pneumonia/bronchiectasis-continue antibiotics. 3 COPD 4 HTN-hold cozaar given need for cardizem and metoprolol for atrial flutter.  Ena Dawley 08/04/2015, 9:12 AM

## 2015-08-04 NOTE — Progress Notes (Signed)
Subjective: Able to give much more history from his wife and him today. He has had chronic difficulty with swallowing over the past 6-8 years. He is seen gastroenterologists whose he states have essentially not given him a clear reason for his dysphagia. He has seen 3 neurologists over this timeframe, Dr. love than another physician at [facility], and finally Dr. Carles Collet. Dr. Carles Collet has recently been evaluating him for his dysphagia ordered an EMG which was performed by Dr. Posey Pronto. There was no widespread denervation to suggest motor neuron disease, and repetitive nerve stimulation was negative. He was supposed to have an MRI of his brain and cervical spine performed as an outpatient yesterday, but due to his respiratory decompensation he was unable to make it that appointment.  Exam: Filed Vitals:   08/04/15 1400 08/04/15 1454  BP: 118/69 118/69  Pulse:  112  Temp:    Resp: 20 15   Gen: In bed, NAD Resp: non-labored breathing, no acute distress Abd: soft, nt  He was just extubated and appears relatively tired. Neuro: MS: Awake, alert, follows commands briskly and answers questions CN: Ocular movements appear intact Motor: He had has apparent good strength in all 4 extremities DTR: 1+ at the bilateral patella, 1+ bilateral biceps  Impression: 70 year old male with long-standing history of dysphagia of unclear etiology. With a history of intermittent diplopia and dysphagia, I think that myasthenia gravis remains in the differential. Repetitive nerve stimulation to not show this on EMG, but this is not 100% sensitive. Acetylcholine receptor antibodies have been sent, but I think it would be worthwhile to send muscle specific kinase antibodies as well.  Also given that it started been arranged, I think that an MRI of the brain and cervical spine is reasonable. This could be performed here or as an outpatient. He still is in a tenuous respiratory status, and I will defer to pulmonology for when he is stable to  go for this testing.  Recommendations: 1) MRI brain and cervical spine(will defer to pulmonology when he is stable for this) 2) muscle specific kinase antibodies(I have ordered these)  3) we will follow-up if and when the MRI is performed as an inpatient, otherwise he can follow-up with Dr. Carles Collet as an outpatient.  Roland Rack, MD [facility] [phone]  If 7pm- 7am, please page neurology on call as listed in [place].

## 2015-08-04 NOTE — Progress Notes (Signed)
[facility] Progress Note Patient Name: PRANAV LINCE DOB: September 30, 1945 MRN: [id number]   Date of Service  08/04/2015  HPI/Events of Note  Phosphorus = 1.9 and Creatinine = 0.67.  eICU Interventions  Will order: 1. Will replete Phosphorus.     Intervention Category Intermediate Interventions: Electrolyte abnormality - evaluation and management  Lysle Dingwall 08/04/2015, 6:47 PM

## 2015-08-04 NOTE — Evaluation (Signed)
Clinical/Bedside Swallow Evaluation Patient Details  Name: Malik Zuniga MRN: [id number] Date of Birth: 12-12-1945  Today's Date: 08/04/2015 Time: SLP Start Time (ACUTE ONLY): 1330 SLP Stop Time (ACUTE ONLY): 1354 SLP Time Calculation (min) (ACUTE ONLY): 24 min  Past Medical History:  Past Medical History  Diagnosis Date  . Hyperlipidemia     takes Simvastatin daily  . Tremor   . History of prostate cancer 2004  . On home O2   . Neuromuscular disorder ([place]) 1998    right carpal tunnel release  . Anemia associated with acute blood loss   . GERD (gastroesophageal reflux disease)     takes Omeprazole daily  . Hypertension     takes Metoprolol daily as well as Hyzaar  . Constipation     takes Colace daily as well as Miralax  . Anxiety     takes Valium daily  . Depression     takes Cymbalta daily  . Emphysema of lung ([facility])     Albuterol as needed;Symbicort daily and Singulair at bedtime  . Myocardial infarction ([place]) 04/1998  . Coronary artery disease   . Asthma   . Shortness of breath     with exertion  . History of bronchitis   . Aspiration pneumonia ([place]) 2010  . Headache, chronic daily   . History of migraine     last migraine a couple of days ago;takes Excedrin Migraine  . Chronic back pain     compression fracture  . History of colon polyps   . Mood change ([place])     after Brain surgery mood changed and was placed on Depakote  . Insomnia     takes Benadryl nightly  . 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 Lung cancer ([place]) 2016   Past Surgical History:  Past Surgical History  Procedure Laterality Date  . Cholecystectomy  1996  . 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 Annette Stable  . Vascular stent  2000    Dr. Rollene Fare; he reports cardiac stent, but denies stent for PAD 06/15/13  . Hand surgery  1989    crushed thumb, Dr. Fredna Dow  . Ulnar nerve repair  1998    left arm, Dr. Fredna Dow  . Gastrostomy w/ feeding tube  2008    Dr. Watt Climes; only had for a few months  .  Prostatectomy  04/2001    removal of prostate cancer, Dr. Janice Norrie  . Coronary angioplasty with stent placement  04/1998  . Carpal tunnel release  1998    right  . Spine surgery    . 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 Back surgery  08/2004; 02/2005; 04/2006; 06/2007; 7/20101    all by Dr. Annette Stable  . Esophagogastroduodenoscopy N/A 07/23/2012    Procedure: ESOPHAGOGASTRODUODENOSCOPY (EGD);  Surgeon: Jeryl Columbia, MD;  Location: [facility] ENDOSCOPY;  Service: Endoscopy;  Laterality: N/A;  buccini /ja  . Colonoscopy    . Back surgery  05/2013  . Dg biopsy lung     HPI:  [name] is a 70 y.o. male with medical history significant of prostate (remote) and recent lung cancer; recent hospitalization and recurrent hospitalizations for aspiration PNA. He was hospitalized 1 month ago for a week for aspiration pneumonia. Was seen today for routine hospital f/u with pulm today and sent to [facility] ER. Wife reports that he started getting sick last Monday night and has gotten progressively worse. Started with generalized weakness, generalized pain,  decreased PO intake, and cough. +hemoptysis, started last night (no history of prior). Has had persistent PNA since October, per wife report. Has taken Ensure but minimal food over the last few days. She has worked hard on giving him only a soft mechanical diet since last hospitalization.  Cough was productive of brown sputum yesterday. +SOB, more than usual. +nasal congestion. No CP/palpitations.  Very, very weak - much more difficulty with mobility. Increased back pain since last fall.  Pt is known to ST service from previous MBS.  Most recent was conducted on 07/05/2015 with recommendation for a dysphagia 1 diet with nectar thick liquids.  The patient was seen once for oupt follow up. Most recent chest x-ray is showing streaky left basilar atelectasis vs infiltrate.    Assessment / Plan / Recommendation Clinical Impression  Clinical swallowing evaluation was  completed using nectar thick liquids via spoon and cup sips and pureed material.  Of note, the patient is well known to ST service from several past MBS's.  The most recent was completed 07/05/2015 with recommendation for dysphagia 1 and nectar thick liquids.  The patient and his wife reported that he's been eating pureed or soft solids at home and has been drinking regular liquids. The patient reported that pureed material seemed to be the easiest for him to eat.  Oral motor exam revealed good labial range of motion, however, the patient had decreased lingual range of motion and strength.  The patient presented with pharyngeal dysphagia characterized by decreased hyo-laryngeal excursion and consistent throat clear given nectar thick liquids via spoon and cup sips.  This was not seen given pureed material.  Given the patient's history, recent intubation and current hospital stay recommend keeping the patient NPO except medications crushed in purees pending reassessment and possible repeat MBS.      Aspiration Risk  Moderate aspiration risk    Diet Recommendation   NPO except meds crushed in puree  Medication Administration: Crushed with puree    Other  Recommendations Oral Care Recommendations: Oral care QID   Follow up Recommendations   (TBD)    Frequency and Duration min 2x/week  2 weeks       Prognosis Prognosis for Safe Diet Advancement: Good      Swallow Study   General Date of Onset: 08/02/15 HPI: Malik Zuniga is a 70 y.o. male with medical history significant of prostate (remote) and recent lung cancer; recent hospitalization and recurrent hospitalizations for aspiration PNA. He was hospitalized 1 month ago for a week for aspiration pneumonia. Was seen today for routine hospital f/u with pulm today and sent to [facility] ER. Wife reports that he started getting sick last Monday night and has gotten progressively worse. Started with generalized weakness, generalized pain, decreased PO  intake, and cough. +hemoptysis, started last night (no history of prior). Has had persistent PNA since October, per wife report. Has taken Ensure but minimal food over the last few days. She has worked hard on giving him only a soft mechanical diet since last hospitalization.  Cough was productive of brown sputum yesterday. +SOB, more than usual. +nasal congestion. No CP/palpitations.  Very, very weak - much more difficulty with mobility. Increased back pain since last fall.  Pt is known to ST service from previous MBS.  Most recent was conducted on 07/05/2015 with recommendation for a dysphagia 1 diet with nectar thick liquids.  The patient was seen once for oupt follow up. Most recent chest x-ray is showing streaky left  basilar atelectasis vs infiltrate.  Type of Study: Bedside Swallow Evaluation Previous Swallow Assessment: MBS 07/05/2015 Rx Dysphagia 1 with nectar thick liquids.   Diet Prior to this Study: NPO Temperature Spikes Noted: Yes (Intermittent low grade temps.  ) Respiratory Status: Nasal cannula History of Recent Intubation: Yes Length of Intubations (days): 1 days Date extubated: 08/04/15 Behavior/Cognition: Alert;Cooperative;Pleasant mood Oral Cavity Assessment: Within Functional Limits Oral Care Completed by SLP: No Oral Cavity - Dentition: Poor condition Vision: Functional for self-feeding Self-Feeding Abilities: Needs assist Patient Positioning:  (As upright as possible.  ) Baseline Vocal Quality: Hoarse Volitional Cough: Strong Volitional Swallow: Able to elicit    Oral/Motor/Sensory Function Overall Oral Motor/Sensory Function: Mild impairment Facial ROM: Within Functional Limits Facial Symmetry: Within Functional Limits Facial Strength: Within Functional Limits Lingual ROM: Reduced right;Reduced left Lingual Symmetry: Within Functional Limits Lingual Strength: Reduced Mandible: Within Functional Limits   Ice Chips Ice chips: Not tested   Thin Liquid Thin  Liquid: Not tested    Nectar Thick Nectar Thick Liquid: Impaired Presentation: Cup;Spoon;Self Fed Pharyngeal Phase Impairments: Throat Clearing - Immediate   Honey Thick Honey Thick Liquid: Not tested   Puree Puree: Impaired Presentation: Spoon   Solid   GO   Solid: Not tested    Functional Assessment Tool Used: ASHA NOMS and clinical judgment.   Functional Limitations: Swallowing Swallow Current Status [id number]): At least 80 percent but less than 100 percent impaired, limited or restricted Swallow Goal Status [phone]): At least 40 percent but less than 60 percent impaired, limited or restricted   Shelly Flatten, MA, [place] [facility] 08/04/2015,2:04 PM

## 2015-08-04 NOTE — Progress Notes (Addendum)
Respiratory Assessment done and patient scored a 10. Nebs frequency continued with an added PRN. Patient tolerating current regimen well. Is continued to help with possible atelectasis and patient continuing on O2 as tolerated to get back to home regimen.

## 2015-08-05 ENCOUNTER — Inpatient Hospital Stay (HOSPITAL_COMMUNITY): Payer: Medicare Other

## 2015-08-05 LAB — GLUCOSE, CAPILLARY
GLUCOSE-CAPILLARY: 138 mg/dL — AB (ref 65–99)
GLUCOSE-CAPILLARY: 153 mg/dL — AB (ref 65–99)
GLUCOSE-CAPILLARY: 106 mg/dL — AB (ref 65–99)
Glucose-Capillary: 132 mg/dL — ABNORMAL HIGH (ref 65–99)
Glucose-Capillary: 131 mg/dL — ABNORMAL HIGH (ref 65–99)
Glucose-Capillary: 124 mg/dL — ABNORMAL HIGH (ref 65–99)

## 2015-08-05 LAB — BASIC METABOLIC PANEL
Anion gap: 9 (ref 5–15)
BUN: 7 mg/dL (ref 6–20)
CALCIUM: 8.4 mg/dL — AB (ref 8.9–10.3)
CO2: 25 mmol/L (ref 22–32)
Chloride: 103 mmol/L (ref 101–111)
Creatinine, Ser: 0.58 mg/dL — ABNORMAL LOW (ref 0.61–1.24)
GFR calc Af Amer: >60 mL/min (ref >60)
GLUCOSE: 142 mg/dL — AB (ref 65–99)
POTASSIUM: 3.9 mmol/L (ref 3.5–5.1)
SODIUM: 137 mmol/L (ref 135–145)

## 2015-08-05 LAB — CBC
HEMATOCRIT: 26.8 % — AB (ref 39.0–52.0)
Hemoglobin: 8.5 g/dL — ABNORMAL LOW (ref 13.0–17.0)
MCH: 26.6 pg (ref 26.0–34.0)
MCHC: 31.7 g/dL (ref 30.0–36.0)
MCV: 84 fL (ref 78.0–100.0)
PLATELETS: 470 10*3/uL — AB (ref 150–400)
RBC: 3.19 MIL/uL — ABNORMAL LOW (ref 4.22–5.81)
RDW: 16.1 % — AB (ref 11.5–15.5)
WBC: 8.1 10*3/uL (ref 4.0–10.5)

## 2015-08-05 LAB — HEPARIN LEVEL (UNFRACTIONATED): HEPARIN UNFRACTIONATED: 0.12 [IU]/mL — AB (ref 0.30–0.70)

## 2015-08-05 MED ORDER — METOPROLOL TARTRATE 25 MG PO TABS
25.0000 mg | ORAL_TABLET | Freq: Once | ORAL | Status: AC
  Administered 2015-08-05: 25 mg via ORAL

## 2015-08-05 MED ORDER — HEPARIN (PORCINE) IN NACL 100-0.45 UNIT/ML-% IJ SOLN
1450.0000 [IU]/h | INTRAMUSCULAR | Status: DC
  Administered 2015-08-05: 1100 [IU]/h via INTRAVENOUS
  Administered 2015-08-06: 1350 [IU]/h via INTRAVENOUS
  Filled 2015-08-05 (×2): qty 250

## 2015-08-05 MED ORDER — METHYLPREDNISOLONE SODIUM SUCC 40 MG IJ SOLR
40.0000 mg | Freq: Every day | INTRAMUSCULAR | Status: DC
  Administered 2015-08-06: 40 mg via INTRAVENOUS
  Filled 2015-08-05: qty 1

## 2015-08-05 MED ORDER — METOPROLOL TARTRATE 50 MG PO TABS
50.0000 mg | ORAL_TABLET | Freq: Two times a day (BID) | ORAL | Status: DC
  Administered 2015-08-05 – 2015-08-09 (×8): 50 mg via ORAL
  Filled 2015-08-05 (×8): qty 1

## 2015-08-05 MED ORDER — SODIUM CHLORIDE 0.9% FLUSH
10.0000 mL | INTRAVENOUS | Status: DC | PRN
Start: 2015-08-05 — End: 2015-08-09

## 2015-08-05 MED ORDER — SODIUM CHLORIDE 0.9% FLUSH
10.0000 mL | Freq: Two times a day (BID) | INTRAVENOUS | Status: DC
  Administered 2015-08-05 – 2015-08-06 (×2): 10 mL

## 2015-08-05 NOTE — Progress Notes (Signed)
[place] for Heparin Indication: atrial fibrillation  Allergies  Allergen Reactions  . Lisinopril Swelling    ANGIOEDEMA  . Lamictal [Lamotrigine] Other (See Comments)    Weakness/difficulty swallowing  . Ambien [Zolpidem] Other (See Comments)    Unknown reaction   Patient Measurements: Height: '5\' 10"'$  (177.8 cm) Weight: 173 lb 15.1 oz (78.9 kg) IBW/kg (Calculated) : 73 Heparin Dosing Weight: 78.9kg  Vital Signs: Temp: 97.8 F (36.6 C) (06/25 0325) Temp Source: Oral (06/25 0325) BP: 110/75 mmHg (06/25 0833) Pulse Rate: 118 (06/25 0833)  Labs:  Recent Labs  08/02/15 2353 08/03/15 0215 08/04/15 0330 08/05/15 0325  HGB  --  9.6* 8.1* 8.5*  HCT  --  29.9* 25.3* 26.8*  PLT  --  514* 421* 470*  APTT 38*  --   --   --   LABPROT 16.3*  --   --   --   INR 1.29  --   --   --   CREATININE  --  0.73 0.67 0.58*   Estimated Creatinine Clearance: 88.7 mL/min (by C-G formula based on Cr of 0.58).  Medical History: Past Medical History  Diagnosis Date  . Hyperlipidemia     takes Simvastatin daily  . Tremor   . History of prostate cancer 2004  . On home O2   . Neuromuscular disorder ([place]) 1998    right carpal tunnel release  . Anemia associated with acute blood loss   . GERD (gastroesophageal reflux disease)     takes Omeprazole daily  . Hypertension     takes Metoprolol daily as well as Hyzaar  . Constipation     takes Colace daily as well as Miralax  . Anxiety     takes Valium daily  . Depression     takes Cymbalta daily  . Emphysema of lung (HCC)     Albuterol as needed;Symbicort daily and Singulair at bedtime  . Myocardial infarction ([place]) 04/1998  . Coronary artery disease   . Asthma   . Shortness of breath     with exertion  . History of bronchitis   . Aspiration pneumonia ([place]) 2010  . Headache, chronic daily   . History of migraine     last migraine a couple of days ago;takes Excedrin Migraine  . Chronic back pain      compression fracture  . History of colon polyps   . Mood change ([place])     after Brain surgery mood changed and was placed on Depakote  . Insomnia     takes Benadryl nightly  . 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 Lung cancer ([place]) 2016   Assessment: 70 yo male with multiple medical problems admitted with hemoptysis.  He has atrial flutter and we have been asked to start IV heparin for him.  His CHADS2Vasc score is 5 and thus at high risk for event of thromboembolus.  He was placed on Amiodarone and Cardizem.  His rate is currently in the high teens (118) but have been to 140's.  His H/H are low but a slight uptrend from yesterday.  Platelets are stable and he has no noted bleeding this morning.    Goal of Therapy:  Heparin Level ~ 0.3-0.5 given recent hemoptysis Monitor platelets by anticoagulation protocol: Yes   Plan:  - Begin IV heparin at 14 units/kg at 1100 units/hr - Check 8 hour heparin level and adjust - Daily CBC/heparin levels  Rober Minion, PharmD., MS Clinical  Pharmacist Pager:  [phone] Thank you for allowing pharmacy to be part of this patients care team. 08/05/2015,9:51 AM

## 2015-08-05 NOTE — Progress Notes (Signed)
MBSS complete. Full report located under chart review in imaging section.  Recommend: Dysphagia 2, thin liquids, NO STRAWS, meds whole in puree Full supervision: cue small bites/ sips, swallow 2x, intermittent throat clear, ensure pt sitting upright during and 30-60 mins after meal

## 2015-08-05 NOTE — Progress Notes (Signed)
[place] for Heparin Indication: atrial fibrillation  Allergies  Allergen Reactions  . Lisinopril Swelling    ANGIOEDEMA  . Lamictal [Lamotrigine] Other (See Comments)    Weakness/difficulty swallowing  . Ambien [Zolpidem] Other (See Comments)    Unknown reaction   Patient Measurements: Height: '5\' 10"'$  (177.8 cm) Weight: 173 lb 15.1 oz (78.9 kg) IBW/kg (Calculated) : 73 Heparin Dosing Weight: 78.9kg  Vital Signs: Temp: 97.6 F (36.4 C) (06/25 1620) Temp Source: Oral (06/25 1620) BP: 121/83 mmHg (06/25 1800) Pulse Rate: 117 (06/25 1800)  Labs:  Recent Labs  08/02/15 2353  08/03/15 0215 08/04/15 0330 08/05/15 0325 08/05/15 1825  HGB  --   < > 9.6* 8.1* 8.5*  --   HCT  --   --  29.9* 25.3* 26.8*  --   PLT  --   --  514* 421* 470*  --   APTT 38*  --   --   --   --   --   LABPROT 16.3*  --   --   --   --   --   INR 1.29  --   --   --   --   --   HEPARINUNFRC  --   --   --   --   --  0.12*  CREATININE  --   --  0.73 0.67 0.58*  --   < > = values in this interval not displayed. Estimated Creatinine Clearance: 88.7 mL/min (by C-G formula based on Cr of 0.58).  Medical History: Past Medical History  Diagnosis Date  . Hyperlipidemia     takes Simvastatin daily  . Tremor   . History of prostate cancer 2004  . On home O2   . Neuromuscular disorder ([place]) 1998    right carpal tunnel release  . Anemia associated with acute blood loss   . GERD (gastroesophageal reflux disease)     takes Omeprazole daily  . Hypertension     takes Metoprolol daily as well as Hyzaar  . Constipation     takes Colace daily as well as Miralax  . Anxiety     takes Valium daily  . Depression     takes Cymbalta daily  . Emphysema of lung (HCC)     Albuterol as needed;Symbicort daily and Singulair at bedtime  . Myocardial infarction ([place]) 04/1998  . Coronary artery disease   . Asthma   . Shortness of breath     with exertion  . History of bronchitis    . Aspiration pneumonia ([place]) 2010  . Headache, chronic daily   . History of migraine     last migraine a couple of days ago;takes Excedrin Migraine  . Chronic back pain     compression fracture  . History of colon polyps   . Mood change ([place])     after Brain surgery mood changed and was placed on Depakote  . Insomnia     takes Benadryl nightly  . 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 Lung cancer ([place]) 2016   Assessment: 70 yo male with multiple medical problems admitted with hemoptysis.  He has atrial flutter and we have been asked to start IV heparin for him.  His CHADS2Vasc score is 5 and thus at high risk for event of thromboembolus.  He was placed on Amiodarone and Cardizem.  His rate is currently in the high teens (118) but have been to 140's.  His H/H are  low but a slight uptrend from yesterday.  Platelets are stable and he has no noted bleeding this morning.    Initial HL 0.12 on 1100 units/hr.  Goal of Therapy:  Heparin Level ~ 0.3-0.5 given recent hemoptysis Monitor platelets by anticoagulation protocol: Yes   Plan:  - Increase heparin to 1350 units/hr - HL in am as already planned  - Daily CBC/heparin levels  Vincenza Hews, PharmD, BCPS 08/05/2015, 7:25 PM Pager: [phone]

## 2015-08-05 NOTE — Progress Notes (Signed)
eLink Physician-Brief Progress Note Patient Name: Malik Zuniga DOB: Oct 19, 1945 MRN: [id number]   Date of Service  08/05/2015  HPI/Events of Note  Patient complaining of insomnia and requesting something for sleep. Nurse reports patient admits he has trouble sleeping at home as well. Recently extubated. Has received when necessary doses of Valium.   eICU Interventions  1. Holding further sedatives at this time 2. Continuing current medications     Intervention Category Minor Interventions: Agitation / anxiety - evaluation and management  Tera Partridge 08/05/2015, 12:08 AM

## 2015-08-05 NOTE — Progress Notes (Signed)
Speech Language Pathology Treatment: Dysphagia  Patient Details Name: Malik Zuniga MRN: [id number] DOB: 1945/06/02 Today's Date: 08/05/2015 Time: 8341-9622 SLP Time Calculation (min) (ACUTE ONLY): 17 min  Assessment / Plan / Recommendation Clinical Impression  Pt seen at bedside for dysphagia treatment for PO trials/ consider readiness for MBS. Pt with consistent throat clear following sips of nectar-thick liquid and bites of puree. Cued pt to take small bites/ sips; however, these s/s persisted. Pt reported having regular foods/ thin liquids at home and had begun to see an outpatient SLP following MBS on 5/25.  Recommend proceeding with repeat MBS to objectively evaluate swallow function. Wife at bedside questioned necessity of repeating the swallow study; educated that pt may be experiencing increased weakness currently given PNA. MBS to be completed later this a.m.   HPI HPI: Malik Zuniga is a 70 y.o. male with medical history significant of prostate (remote) and recent lung cancer; recent hospitalization and recurrent hospitalizations for aspiration PNA. He was hospitalized 1 month ago for a week for aspiration pneumonia. Was seen today for routine hospital f/u with pulm today and sent to [facility] ER. Wife reports that he started getting sick last Monday night and has gotten progressively worse. Started with generalized weakness, generalized pain, decreased PO intake, and cough. +hemoptysis, started last night (no history of prior). Has had persistent PNA since October, per wife report. Has taken Ensure but minimal food over the last few days. She has worked hard on giving him only a soft mechanical diet since last hospitalization.  Cough was productive of brown sputum yesterday. +SOB, more than usual. +nasal congestion. No CP/palpitations.  Very, very weak - much more difficulty with mobility. Increased back pain since last fall.  Pt is known to ST service from previous MBS.  Most recent  was conducted on 07/05/2015 with recommendation for a dysphagia 1 diet with nectar thick liquids.  The patient was seen once for oupt follow up. Most recent chest x-ray is showing streaky left basilar atelectasis vs infiltrate.       SLP Plan  MBS     Recommendations  Diet recommendations: NPO Medication Administration: Crushed with puree             Oral Care Recommendations: Oral care QID Follow up Recommendations:  (TBD) Plan: MBS     GO                Kern Reap, MA, CCC-SLP 08/05/2015, 12:47 PM [phone]

## 2015-08-05 NOTE — Progress Notes (Signed)
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 He was formally followed by Dr. [name] and is now followed by Dr. [name] in the pulmonary clinic.  He was sent in for the pulmonary clinic today where he was noted to be confused and tachycardic. He reports 4 days of coughing up.brown mucus and hemoptysis of blood tinged sputum appearing pink or the last 24 hours multiple episodes. This history is confirmed by his wife. He reports feeling warm and sweats but no fever was recorded. He was prescribed Augmentin which she took he has taken this in the past when he needed prolonged antibiotics. He has tried to be compliant with a pured diet  In the ED was noted to have a lactate of 1.3 and a white count of 13.2 chest x-ray showed no clear infiltrates and a mild right lower lobe platelike atelectasis He was given cefepime and vancomycin and started on Cardizem for atrial fibrillation and RVR    He was treated with SB RT for right lower lobe adenocarcinoma in 2016 with decrease in the size of the nodule and a follow-up scan in 04/2015 He was admitted multiple times in 2017 for recurrent aspiration pneumonia and last scan from 04/2015 shows improved left lower lobe infiltrate.  His spirometry from 04/2009 shows FEV1 of 1.65-47% with a ratio 49 suggesting stage III COPD  SIGNIFICANT EVENTS  6/23 intubated 6/24 extubated  STUDIES:  04/2015 CT chest - Decrease in size of a right lower lobe adenocarcinoma with changes of radiation therapy in the adjacent right lung. Improving left lower lobe consolidation with a residual small left pleural effusion.   SUBJECTIVE:  Afebrile On [place], comfortable -denies pain No  further hemoptysis   VITAL SIGNS: Temp:  [97.8 F (36.6 C)-98.7 F (37.1 C)] 97.8 F (36.6 C) (06/25 0325) Pulse Rate:  [107-140] 118 (06/25 0833) Resp:  [11-31] 19 (06/25 0728) BP: (98-129)/(63-94) 110/75 mmHg (06/25 0833) SpO2:  [91 %-98 %] 95 % (06/25 0728) Weight:  [173 lb 15.1 oz (78.9 kg)] 173 lb 15.1 oz (78.9 kg) (06/25 0332)  PHYSICAL EXAMINATION: General: chr ill appearing, slouched to left Neuro-  Weak,interactive, non focal HEENT:  jvd  Cardiovascular:  S1 and S2 tachycardia and irregular, no murmur Lungs:  Decreased breath sounds bilateteral coarse Abdomen:  Soft and nontender Musculoskeletal:  Good pulses, no edema   Recent Labs Lab 08/03/15 0215 08/04/15 0330 08/05/15 0325  NA 137 135 137  K 4.0 4.1 3.9  CL 106 106 103  CO2 '22 22 25  '$ BUN '12 8 7  '$ CREATININE 0.73 0.67 0.58*  GLUCOSE 104* 128* 142*    Recent Labs Lab 08/03/15 0215 08/04/15 0330 08/05/15 0325  HGB 9.6* 8.1* 8.5*  HCT 29.9* 25.3* 26.8*  WBC 10.4 8.1 8.1  PLT 514* 421* 470*   Dg Chest Port 1 View  08/03/2015  CLINICAL DATA:  Intubated EXAM: PORTABLE CHEST 1 VIEW COMPARISON:  08/03/2015 FINDINGS: Cardiomediastinal silhouette is stable. There is endotracheal tube in place with tip 6.6 cm above the carina. Stable NG tube position. Stable right IJ central line position. No pulmonary edema. There is streaky left basilar atelectasis or infiltrate. No pneumothorax. Please note the left apex is obscured by  patient's overlying chin. IMPRESSION: There is endotracheal tube in place with tip 6.6 cm above the carina. Stable NG tube position. Stable right IJ central line position. No pulmonary edema. There is streaky left basilar atelectasis or infiltrate. No pneumothorax. Electronically Signed   By: Lahoma Crocker M.D.   On: 08/03/2015 18:37   Dg Chest Port 1 View  08/03/2015  CLINICAL DATA:  Central line placement. EXAM: PORTABLE CHEST 1 VIEW COMPARISON:  Earlier today at 0919 hours. FINDINGS: endotracheal  tube remains high, 9.9 cm above the carina. Nasogastric extends beyond the inferior aspect of the film. Right internal jugular line tip at mid SVC. Normal heart size. No pleural effusion or pneumothorax. Patchy bibasilar airspace disease. IMPRESSION: Right internal jugular line appropriately positioned without pneumothorax. Endotracheal tube remains somewhat high and could be advanced 3-4 cm. Persistent bibasilar airspace disease. Electronically Signed   By: Abigail Miyamoto M.D.   On: 08/03/2015 10:22   Dg Chest Port 1 View  08/03/2015  CLINICAL DATA:  Intubation and nasogastric tube placement. EXAM: PORTABLE CHEST 1 VIEW COMPARISON:  08/02/2015 FINDINGS: Two frontal views of the chest. Endotracheal tube is difficult to visualize centrally but is felt to terminate 9.2 cm above the carina, well below the level of the clavicles. Nasogastric tube terminates at the body of the stomach, but the side-port is not visualized and may be at or just above the gastroesophageal junction. thoracolumbar spine fixation hardware and multiple overlying wires and leads. Patient rotated left. Normal heart size. No pleural effusion or pneumothorax. Underlying hyperinflation and bibasilar interstitial thickening. Especially compared to 07/16/15, increased right base opacity. There is also mild left retrocardiac airspace disease. IMPRESSION: Endotracheal tube 9.2 cm above carina. This could be advanced 2-3 cm for optimum positioning. Nasogastric tube at the body of the stomach. Side port may be at or just above the gastroesophageal junction. Consider advancement. Worsening bibasilar aeration. atelectasis or developing infection/aspiration. Electronically Signed   By: Abigail Miyamoto M.D.   On: 08/03/2015 09:34    ASSESSMENT / PLAN:  Acute resp failure -  SBTs with goal extubation   Acute exacerbation of bronchiectasis versus another aspiration episode -ct zosyn, dc vanc -follow  sputum culture and tailor antibiotics  accordingly -swallow eval ongoing  Hemoptysis, likely related to above -  improved with antibiotics  COPD-no evidence of exacerbation,-He quit smoking in 2014 -Continue Pulmicort and DuoNeb nebs -solumedrol 40 q 24 - can stop in 24h   Acute encephalopathy - related to sepsis, resolved Chronic pain/ bipolar - resume meds with puree Long standing unexplained dysphagia with intermittent diplopia - myasthenia abs sent -MRI brain + C spine was planned as outpt - hold off for now but can obtain during this admit  Atrial fibrillation/RVR - new onset, although he states that Dr. Tamala Julian has noted this in the past , - recent nuclear stress test was a low-risk study with EF of 48%  - can start heparin gtt now that hemoptysis subsided (high CHADS) - continue Cardizem drip  - ct rhythm control with amiodarone    The patient is critically ill with multiple organ systems failure and requires high complexity decision making for assessment and support, frequent evaluation and titration of therapies, application of advanced monitoring technologies and extensive interpretation of multiple databases. Critical Care Time devoted to patient care services described in this note independent of APP time is 32 minutes.   Kara Mead MD. [place] Flood. [facility] Pager [phone] If no response call [phone]   08/05/2015       

## 2015-08-05 NOTE — Progress Notes (Addendum)
Pharmacy Antibiotic Note  Malik Zuniga is a 70 y.o. male admitted on 08/02/2015 with pneumonia.  Pharmacy dosing IV antibiotics (Vancomycin and Zosyn).  The vancomycin was discontinued this morning and patient remains only on Zosyn.  All culture data is negative thus far.  His WBC is wnl, creatinine is 0.58 with an estimated crcl of 83m/min.  Plan: Continue Zosyn as ordered 3.375gm IV every 8 hours Pharmacy to sign off with antibiotics dosing since his renal function is stable.  Should this change, please let uKoreaknow..Marland Kitchen Height: '5\' 10"'$  (177.8 cm) Weight: 173 lb 15.1 oz (78.9 kg) IBW/kg (Calculated) : 73  Temp (24hrs), Avg:98.2 F (36.8 C), Min:97.8 F (36.6 C), Max:98.7 F (37.1 C)   Recent Labs Lab 08/02/15 1744 08/02/15 1755 08/02/15 2155 08/02/15 2353 08/03/15 0215 08/04/15 0330 08/05/15 0325  WBC 13.2*  --   --   --  10.4 8.1 8.1  CREATININE 0.91  --   --   --  0.73 0.67 0.58*  LATICACIDVEN  --  1.32 1.38 1.2 1.4  --   --     Estimated Creatinine Clearance: 88.7 mL/min (by C-G formula based on Cr of 0.58).    Allergies  Allergen Reactions  . Lisinopril Swelling    ANGIOEDEMA  . Lamictal [Lamotrigine] Other (See Comments)    Weakness/difficulty swallowing  . Ambien [Zolpidem] Other (See Comments)    Unknown reaction   Antimicrobials this admission: 6/23 flagyl>> 6/23 6/23 zosyn>> 6/22 Vanc >> 6/25 6/22 Cefepime >> 6/22  Microbiology results: 6/22 BCx: NGTD 6/22 UCx: NG  NRober Minion PharmD., MS Clinical Pharmacist Pager:  [phone] you for allowing pharmacy to be part of this patients care team. 08/05/2015 9:22 AM

## 2015-08-05 NOTE — Progress Notes (Signed)
Subjective:  The patient is feeling much better today, minimal SOB, no chest pain, He has difficulties swallowing but able to swallow pills.   Meds:  . antiseptic oral rinse  7 mL Mouth Rinse q12n4p  . ARIPiprazole  5 mg Oral Daily  . budesonide  0.5 mg Nebulization BID  . chlorhexidine  15 mL Mouth Rinse BID  . divalproex  500 mg Oral Q12H  . docusate sodium  200 mg Oral QHS  . insulin aspart  0-9 Units Subcutaneous Q4H  . ipratropium  0.5 mg Nebulization Q6H  . levalbuterol  0.63 mg Nebulization Q6H  . [START ON 08/06/2015] methylPREDNISolone (SOLU-MEDROL) injection  40 mg Intravenous Daily  . metoprolol  5 mg Intravenous Once  . metoprolol tartrate  25 mg Oral BID  . pantoprazole  40 mg Oral Daily  . piperacillin-tazobactam (ZOSYN)  IV  3.375 g Intravenous Q8H  . primidone  250 mg Oral BID  . simvastatin  40 mg Oral q1800  . sodium chloride flush  10-40 mL Intracatheter Q12H  . sodium chloride flush  10-40 mL Intracatheter Q12H   . sodium chloride    . amiodarone 30 mg/hr (08/04/15 2057)  . diltiazem (CARDIZEM) infusion 15 mg/hr (08/05/15 0407)    Objective:  Filed Vitals:   08/05/15 0600 08/05/15 0700 08/05/15 0728 08/05/15 0833  BP: 104/64 120/80 120/80 110/75  Pulse:   107 118  Temp:      TempSrc:      Resp: '17 14 19   '$ Height:      Weight:      SpO2: 97% 95% 95%    Intake/Output from previous day:  Intake/Output Summary (Last 24 hours) at 08/05/15 0912 Last data filed at 08/05/15 0800  Gross per 24 hour  Intake 3134.6 ml  Output    570 ml  Net 2564.6 ml    Physical Exam: Physical exam: Well-developed chronically ill appearing in mild respiratory distress.  Skin is warm and dry.  HEENT is normal.  Neck is supple. Chest diminished BS throughout Cardiovascular exam is tachycaric Abdominal exam nontender or distended. No masses palpated. Extremities show no edema. neuro answers questions appropriately    Lab Results: Basic Metabolic  Panel:  Recent Labs  08/04/15 0330 08/04/15 0750 08/04/15 1658 08/05/15 0325  NA 135  --   --  137  K 4.1  --   --  3.9  CL 106  --   --  103  CO2 22  --   --  25  GLUCOSE 128*  --   --  142*  BUN 8  --   --  7  CREATININE 0.67  --   --  0.58*  CALCIUM 7.9*  --   --  8.4*  MG  --  1.8 1.8  --   PHOS  --  2.1* 1.9*  --    CBC:  Recent Labs  08/02/15 1744  08/04/15 0330 08/05/15 0325  WBC 13.2*  < > 8.1 8.1  NEUTROABS 10.9*  --  7.3  --   HGB 11.4*  < > 8.1* 8.5*  HCT 33.8*  < > 25.3* 26.8*  MCV 83.9  < > 84.6 84.0  PLT 629*  < > 421* 470*  < > = values in this interval not displayed.   Assessment/Plan:  1 Atrial flutter-patient remains in atrial flutter; chadsvasc 5; however no anticoagulation due to hemoptysis. Continue amiodarone and cardizem; he was just extubated, BP still soft, but I  will increase metoprolol to 50 mg po BID and keep iv amiodarone and diltiazem for now. Transition possibly tomorrow.  Hr in 85-112 BPM. Can proceed with DCCV if absolutely necessary but would be high risk for embolic event given not anticoagulated; check echo. 2 pneumonia/bronchiectasis-continue antibiotics. 3 COPD 4 HTN-hold cozaar given need for cardizem and metoprolol for atrial flutter.  Ena Dawley 08/05/2015, 9:12 AM

## 2015-08-06 ENCOUNTER — Encounter (HOSPITAL_COMMUNITY): Payer: Self-pay | Admitting: Physician Assistant

## 2015-08-06 ENCOUNTER — Other Ambulatory Visit: Payer: Self-pay | Admitting: Interventional Cardiology

## 2015-08-06 ENCOUNTER — Ambulatory Visit (HOSPITAL_COMMUNITY): Payer: Medicare Other

## 2015-08-06 DIAGNOSIS — I4892 Unspecified atrial flutter: Secondary | ICD-10-CM

## 2015-08-06 DIAGNOSIS — J9601 Acute respiratory failure with hypoxia: Secondary | ICD-10-CM

## 2015-08-06 LAB — CULTURE, RESPIRATORY: CULTURE: NORMAL

## 2015-08-06 LAB — BASIC METABOLIC PANEL WITH GFR
Anion gap: 6 (ref 5–15)
BUN: 7 mg/dL (ref 6–20)
CO2: 27 mmol/L (ref 22–32)
Calcium: 8.4 mg/dL — ABNORMAL LOW (ref 8.9–10.3)
Chloride: 101 mmol/L (ref 101–111)
Creatinine, Ser: 0.66 mg/dL (ref 0.61–1.24)
GFR calc Af Amer: >60 mL/min
GFR calc non Af Amer: >60 mL/min
Glucose, Bld: 101 mg/dL — ABNORMAL HIGH (ref 65–99)
Potassium: 3.3 mmol/L — ABNORMAL LOW (ref 3.5–5.1)
Sodium: 134 mmol/L — ABNORMAL LOW (ref 135–145)

## 2015-08-06 LAB — ECHOCARDIOGRAM COMPLETE
HEIGHTINCHES: 70 in
Weight: 2835.2 oz

## 2015-08-06 LAB — GLUCOSE, CAPILLARY
GLUCOSE-CAPILLARY: 119 mg/dL — AB (ref 65–99)
Glucose-Capillary: 117 mg/dL — ABNORMAL HIGH (ref 65–99)
Glucose-Capillary: 118 mg/dL — ABNORMAL HIGH (ref 65–99)
Glucose-Capillary: 118 mg/dL — ABNORMAL HIGH (ref 65–99)
Glucose-Capillary: 136 mg/dL — ABNORMAL HIGH (ref 65–99)
Glucose-Capillary: 98 mg/dL (ref 65–99)

## 2015-08-06 LAB — HEPARIN LEVEL (UNFRACTIONATED): HEPARIN UNFRACTIONATED: 0.28 [IU]/mL — AB (ref 0.30–0.70)

## 2015-08-06 LAB — CBC
HCT: 26.5 % — ABNORMAL LOW (ref 39.0–52.0)
HEMOGLOBIN: 8.6 g/dL — AB (ref 13.0–17.0)
MCH: 27 pg (ref 26.0–34.0)
MCHC: 32.5 g/dL (ref 30.0–36.0)
MCV: 83.1 fL (ref 78.0–100.0)
Platelets: 465 10*3/uL — ABNORMAL HIGH (ref 150–400)
RBC: 3.19 MIL/uL — AB (ref 4.22–5.81)
RDW: 15.8 % — ABNORMAL HIGH (ref 11.5–15.5)
WBC: 6.9 10*3/uL (ref 4.0–10.5)

## 2015-08-06 MED ORDER — DULOXETINE HCL 60 MG PO CPEP
60.0000 mg | ORAL_CAPSULE | Freq: Two times a day (BID) | ORAL | Status: DC
  Administered 2015-08-06 – 2015-08-09 (×7): 60 mg via ORAL
  Filled 2015-08-06 (×7): qty 1

## 2015-08-06 MED ORDER — GUAIFENESIN 100 MG/5ML PO SOLN
5.0000 mL | ORAL | Status: DC | PRN
  Administered 2015-08-06 – 2015-08-07 (×4): 100 mg via ORAL
  Filled 2015-08-06 (×4): qty 5

## 2015-08-06 MED ORDER — DULOXETINE HCL 60 MG PO CPEP: 120.0000 mg | ORAL_CAPSULE | Freq: Every day | ORAL | Status: DC

## 2015-08-06 MED ORDER — ENOXAPARIN SODIUM 80 MG/0.8ML ~~LOC~~ SOLN
80.0000 mg | Freq: Once | SUBCUTANEOUS | Status: AC
  Administered 2015-08-06: 80 mg via SUBCUTANEOUS
  Filled 2015-08-06: qty 0.8

## 2015-08-06 MED ORDER — DIVALPROEX SODIUM ER 500 MG PO TB24: 1000.0000 mg | ORAL_TABLET | Freq: Every day | ORAL | Status: DC

## 2015-08-06 MED ORDER — DILTIAZEM HCL ER COATED BEADS 240 MG PO CP24
240.0000 mg | ORAL_CAPSULE | Freq: Every day | ORAL | Status: DC
  Administered 2015-08-06 – 2015-08-09 (×4): 240 mg via ORAL
  Filled 2015-08-06 (×4): qty 1

## 2015-08-06 MED ORDER — POTASSIUM CHLORIDE 10 MEQ/50ML IV SOLN
10.0000 meq | INTRAVENOUS | Status: AC
  Administered 2015-08-06 (×4): 10 meq via INTRAVENOUS
  Filled 2015-08-06 (×4): qty 50

## 2015-08-06 MED ORDER — ENOXAPARIN SODIUM 80 MG/0.8ML ~~LOC~~ SOLN
80.0000 mg | Freq: Two times a day (BID) | SUBCUTANEOUS | Status: DC
  Administered 2015-08-06: 80 mg via SUBCUTANEOUS
  Filled 2015-08-06: qty 0.8

## 2015-08-06 MED ORDER — ENSURE ENLIVE PO LIQD
237.0000 mL | Freq: Two times a day (BID) | ORAL | Status: DC
  Administered 2015-08-06 – 2015-08-09 (×4): 237 mL via ORAL

## 2015-08-06 MED ORDER — DIAZEPAM 5 MG PO TABS
5.0000 mg | ORAL_TABLET | Freq: Four times a day (QID) | ORAL | Status: DC | PRN
  Administered 2015-08-06 – 2015-08-08 (×4): 5 mg via ORAL
  Filled 2015-08-06 (×4): qty 1

## 2015-08-06 MED ORDER — DIVALPROEX SODIUM ER 500 MG PO TB24
1000.0000 mg | ORAL_TABLET | Freq: Every day | ORAL | Status: DC
  Administered 2015-08-07 – 2015-08-08 (×2): 1000 mg via ORAL
  Filled 2015-08-06 (×2): qty 2

## 2015-08-06 MED ORDER — DIVALPROEX SODIUM 125 MG PO CSDR
500.0000 mg | DELAYED_RELEASE_CAPSULE | Freq: Two times a day (BID) | ORAL | Status: AC
  Administered 2015-08-07: 500 mg via ORAL
  Filled 2015-08-06 (×2): qty 4

## 2015-08-06 MED ORDER — ATORVASTATIN CALCIUM 20 MG PO TABS
20.0000 mg | ORAL_TABLET | Freq: Every day | ORAL | Status: DC
  Administered 2015-08-06 – 2015-08-08 (×3): 20 mg via ORAL
  Filled 2015-08-06 (×3): qty 1

## 2015-08-06 NOTE — Progress Notes (Signed)
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 He was formally followed by Dr. [name] and is now followed by Dr. [name] in the pulmonary clinic.  He was sent in for the pulmonary clinic today where he was noted to be confused and tachycardic. He reports 4 days of coughing up.brown mucus and hemoptysis of blood tinged sputum appearing pink or the last 24 hours multiple episodes. This history is confirmed by his wife. He reports feeling warm and sweats but no fever was recorded. He was prescribed Augmentin which she took he has taken this in the past when he needed prolonged antibiotics. He has tried to be compliant with a pured diet  In the ED was noted to have a lactate of 1.3 and a white count of 13.2 chest x-ray showed no clear infiltrates and a mild right lower lobe platelike atelectasis He was given cefepime and vancomycin and started on Cardizem for atrial fibrillation and RVR    He was treated with SB RT for right lower lobe adenocarcinoma in 2016 with decrease in the size of the nodule and a follow-up scan in 04/2015 He was admitted multiple times in 2017 for recurrent aspiration pneumonia and last scan from 04/2015 shows improved left lower lobe infiltrate.  His spirometry from 04/2009 shows FEV1 of 1.65-47% with a ratio 49 suggesting stage III COPD  SIGNIFICANT EVENTS  6/23 intubated 6/24 extubated  STUDIES:  04/2015 CT chest - Decrease in size of a right lower lobe adenocarcinoma with changes of radiation therapy in the adjacent right lung. Improving left lower lobe consolidation with a residual small left pleural effusion.   SUBJECTIVE:  Afebrile [place] denies pain, feeling nervous -can  I have an extra valium No further hemoptysis on heparin Back in nSR  VITAL SIGNS: Temp:  [97.5 F (36.4 C)-98.6 F (37 C)] 98.1 F (36.7 C) (06/26 0737) Pulse Rate:  [77-143] 78 (06/26 0800) Resp:  [18-26] 19 (06/26 0800) BP: (106-139)/(67-97) 129/83 mmHg (06/26 0800) SpO2:  [92 %-100 %] 95 % (06/26 0904) Weight:  [177 lb 3.2 oz (80.377 kg)] 177 lb 3.2 oz (80.377 kg) (06/26 0318)  PHYSICAL EXAMINATION: General: chr ill appearing, slouched to left Neuro-  Weak,interactive, non focal HEENT:  jvd  Cardiovascular:  S1 and S2 tachycardia and irregular, no murmur Lungs:  Decreased breath sounds bilateteral coarse Abdomen:  Soft and nontender Musculoskeletal:  Good pulses, no edema   Recent Labs Lab 08/04/15 0330 08/05/15 0325 08/06/15 0340  NA 135 137 134*  K 4.1 3.9 3.3*  CL 106 103 101  CO2 '22 25 27  '$ BUN '8 7 7  '$ CREATININE 0.67 0.58* 0.66  GLUCOSE 128* 142* 101*    Recent Labs Lab 08/04/15 0330 08/05/15 0325 08/06/15 0340  HGB 8.1* 8.5* 8.6*  HCT 25.3* 26.8* 26.5*  WBC 8.1 8.1 6.9  PLT 421* 470* 465*   Dg Chest Port 1 View  08/05/2015  CLINICAL DATA:  Acute respiratory failure EXAM: PORTABLE CHEST 1 VIEW COMPARISON:  08/03/2015 FINDINGS: Cardiomediastinal silhouette is stable. Endotracheal tube has been removed. Right IJ central line is unchanged in position. No pulmonary edema. Persistent small left pleural effusion with left basilar atelectasis or infiltrate. No pneumothorax. IMPRESSION: Endotracheal  tube has been removed. No pneumothorax. Stable right adjacent line position. Small left pleural effusion weight left basilar atelectasis or infiltrate. Electronically Signed   By: Lahoma Crocker M.D.   On: 08/05/2015 09:17   Dg Swallowing Func-speech Pathology  08/05/2015  Objective Swallowing Evaluation: Type of Study: MBS-Modified Barium Swallow Study Patient Details Name: Malik Zuniga MRN: [id number] Date of Birth: 01/01/1946 Today's Date: 08/05/2015 Time: SLP Start Time  (ACUTE ONLY): 1135-SLP Stop Time (ACUTE ONLY): 1200 SLP Time Calculation (min) (ACUTE ONLY): 25 min Past Medical History: Past Medical History Diagnosis Date . Hyperlipidemia    takes Simvastatin daily . Tremor  . History of prostate cancer 2004 . On home O2  . Neuromuscular disorder ([place]) 1998   right carpal tunnel release . Anemia associated with acute blood loss  . GERD (gastroesophageal reflux disease)    takes Omeprazole daily . Hypertension    takes Metoprolol daily as well as Hyzaar . Constipation    takes Colace daily as well as Miralax . Anxiety    takes Valium daily . Depression    takes Cymbalta daily . Emphysema of lung (HCC)    Albuterol as needed;Symbicort daily and Singulair at bedtime . Myocardial infarction ([place]) 04/1998 . Coronary artery disease  . Asthma  . Shortness of breath    with exertion . History of bronchitis  . Aspiration pneumonia ([place]) 2010 . Headache, chronic daily  . History of migraine    last migraine a couple of days ago;takes Excedrin Migraine . Chronic back pain    compression fracture . History of colon polyps  . Mood change ([place])    after Brain surgery mood changed and was placed on Depakote . Insomnia    takes Benadryl nightly . 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 Lung cancer ([place]) 2016 Past Surgical History: Past Surgical History Procedure Laterality Date . Cholecystectomy  1996 . Craniotomy  1999   to clip aneurism, Dr. Annette Stable . Vascular stent  2000   Dr. Rollene Fare; he reports cardiac stent, but denies stent for PAD 06/15/13 . Hand surgery  1989   crushed thumb, Dr. Fredna Dow . Ulnar nerve repair  1998   left arm, Dr. Fredna Dow . Gastrostomy w/ feeding tube  2008   Dr. Watt Climes; only had for a few months . Prostatectomy  04/2001   removal of prostate cancer, Dr. Janice Norrie . Coronary angioplasty with stent placement  04/1998 . Carpal tunnel release  1998   right . Spine surgery   . Brain surgery  1999   clip aneurysm . Back surgery  08/2004; 02/2005; 04/2006; 06/2007; 7/20101   all by Dr. Annette Stable .  Esophagogastroduodenoscopy N/A 07/23/2012   Procedure: ESOPHAGOGASTRODUODENOSCOPY (EGD);  Surgeon: Jeryl Columbia, MD;  Location: [facility] ENDOSCOPY;  Service: Endoscopy;  Laterality: N/A;  buccini /ja . Colonoscopy   . Back surgery  05/2013 . Dg biopsy lung   HPI: KENLEY TROOP is a 70 y.o. male with medical history significant of prostate (remote) and recent lung cancer; recent hospitalization and recurrent hospitalizations for aspiration PNA. He was hospitalized 1 month ago for a week for aspiration pneumonia. Was seen today for routine hospital f/u with pulm today and sent to [facility] ER. Wife reports that he started getting sick last Monday night and has gotten progressively worse. Started with generalized weakness, generalized pain, decreased PO intake, and cough. +hemoptysis, started last night (no history of prior). Has had persistent PNA since October, per wife report. Has taken Ensure but minimal  food over the last few days. She has worked hard on giving him only a soft mechanical diet since last hospitalization.  Cough was productive of brown sputum yesterday. +SOB, more than usual. +nasal congestion. No CP/palpitations.  Very, very weak - much more difficulty with mobility. Increased back pain since last fall.  Pt is known to ST service from previous MBS.  Most recent was conducted on 07/05/2015 with recommendation for a dysphagia 1 diet with nectar thick liquids.  The patient was seen once for oupt follow up. Most recent chest x-ray is showing streaky left basilar atelectasis vs infiltrate.  Subjective: The patient was seen sitting upright in bed with his wife and son present.  Assessment / Plan / Recommendation CHL IP CLINICAL IMPRESSIONS 08/05/2015 Therapy Diagnosis Moderate oral phase dysphagia;Mild pharyngeal phase dysphagia Clinical Impression Pt currently presenting with a mild-moderate oropharyngeal dysphagia.  Dysphagia characteristics similar to recent study on 07/05/15; however, today silent  aspiration was observed x1 with thin liquids by straw- pt continues to have poor bolus containment resulting in premature spill to the level of the pyriform sinuses prior to swallow initiation. No penetration or aspiration observed with multiple trials of thin liquid by cup with pt cued to "take a small sip". Across consistencies pt with mild-moderate residuals at the level of vallecula and pyriform sinuses which appears to be due to pt's head/ neck position being slightly downward, along with decreased anterior hyolaryngeal movement; most of residuals were cleared when pt cued to swallow again. Was difficult to visualize UES for most of study due to positioning; however, was able to see following a puree bolus with which there was the appearance of a cricopharyngeal bar with minimal residuals. Recommend initiating dysphagia 2 diet with thin liquids by cup- no straws, meds whole in puree, full supervision to cue pt to take small bites/ sips, multiple swallows, and intermittent throat clear. Ensure pt is sitting upright during meals looking straight ahead and not leaning forward; remain upright 30-60 minutes after meal. Will continue to follow closely for diet tolerance.  Impact on safety and function Moderate aspiration risk   CHL IP TREATMENT RECOMMENDATION 08/05/2015 Treatment Recommendations Therapy as outlined in treatment plan below   Prognosis 08/05/2015 Prognosis for Safe Diet Advancement Fair Barriers to Reach Goals -- Barriers/Prognosis Comment -- CHL IP DIET RECOMMENDATION 08/05/2015 SLP Diet Recommendations Dysphagia 2 (Fine chop) solids;Thin liquid Liquid Administration via Cup;No straw Medication Administration Whole meds with puree Compensations Slow rate;Small sips/bites;Multiple dry swallows after each bite/sip;Clear throat intermittently Postural Changes Seated upright at 90 degrees   CHL IP OTHER RECOMMENDATIONS 08/05/2015 Recommended Consults -- Oral Care Recommendations Oral care BID Other  Recommendations --   CHL IP FOLLOW UP RECOMMENDATIONS 08/05/2015 Follow up Recommendations Outpatient SLP   CHL IP FREQUENCY AND DURATION 08/05/2015 Speech Therapy Frequency (ACUTE ONLY) min 2x/week Treatment Duration 2 weeks      CHL IP ORAL PHASE 08/05/2015 Oral Phase Impaired Oral - Pudding Teaspoon -- Oral - Pudding Cup -- Oral - Honey Teaspoon -- Oral - Honey Cup -- Oral - Nectar Teaspoon -- Oral - Nectar Cup -- Oral - Nectar Straw -- Oral - Thin Teaspoon -- Oral - Thin Cup Premature spillage;Decreased bolus cohesion Oral - Thin Straw Decreased bolus cohesion;Premature spillage Oral - Puree Decreased bolus cohesion;Premature spillage Oral - Mech Soft Decreased bolus cohesion;Premature spillage Oral - Regular -- Oral - Multi-Consistency -- Oral - Pill -- Oral Phase - Comment --  CHL IP PHARYNGEAL PHASE 08/05/2015 Pharyngeal Phase Impaired  Pharyngeal- Pudding Teaspoon -- Pharyngeal -- Pharyngeal- Pudding Cup -- Pharyngeal -- Pharyngeal- Honey Teaspoon -- Pharyngeal -- Pharyngeal- Honey Cup -- Pharyngeal -- Pharyngeal- Nectar Teaspoon -- Pharyngeal -- Pharyngeal- Nectar Cup -- Pharyngeal -- Pharyngeal- Nectar Straw -- Pharyngeal -- Pharyngeal- Thin Teaspoon -- Pharyngeal -- Pharyngeal- Thin Cup Delayed swallow initiation-pyriform sinuses;Reduced anterior laryngeal mobility;Pharyngeal residue - valleculae;Pharyngeal residue - pyriform Pharyngeal -- Pharyngeal- Thin Straw Delayed swallow initiation-pyriform sinuses;Reduced anterior laryngeal mobility;Penetration/Aspiration before swallow;Pharyngeal residue - valleculae;Pharyngeal residue - pyriform Pharyngeal Material enters airway, passes BELOW cords without attempt by patient to eject out (silent aspiration) Pharyngeal- Puree Delayed swallow initiation-vallecula;Reduced anterior laryngeal mobility;Pharyngeal residue - pyriform;Pharyngeal residue - valleculae Pharyngeal -- Pharyngeal- Mechanical Soft Delayed swallow initiation-vallecula;Reduced anterior laryngeal  mobility;Pharyngeal residue - valleculae;Pharyngeal residue - pyriform Pharyngeal -- Pharyngeal- Regular -- Pharyngeal -- Pharyngeal- Multi-consistency -- Pharyngeal -- Pharyngeal- Pill -- Pharyngeal -- Pharyngeal Comment --  CHL IP CERVICAL ESOPHAGEAL PHASE 08/05/2015 Cervical Esophageal Phase Impaired Pudding Teaspoon -- Pudding Cup -- Honey Teaspoon -- Honey Cup -- Nectar Teaspoon -- Nectar Cup -- Nectar Straw -- Thin Teaspoon -- Thin Cup -- Thin Straw -- Puree -- Mechanical Soft -- Regular -- Multi-consistency -- Pill -- Cervical Esophageal Comment -- CHL IP GO 08/04/2015 Functional Assessment Tool Used ASHA NOMS and clinical judgment.   Functional Limitations Swallowing Swallow Current Status [phone]) CM Swallow Goal Status (H4765) CK Swallow Discharge Status (Y6503) (None) Motor Speech Current Status [phone]) (None) Motor Speech Goal Status [phone]) (None) Motor Speech Goal Status [phone]) (None) Spoken Language Comprehension Current Status [phone]) (None) Spoken Language Comprehension Goal Status (H6759) (None) Spoken Language Comprehension Discharge Status [phone]) (None) Spoken Language Expression Current Status [phone]) (None) Spoken Language Expression Goal Status [phone]) (None) Spoken Language Expression Discharge Status [phone]) (None) Attention Current Status (Q3009) (None) Attention Goal Status (Q3300) (None) Attention Discharge Status [phone]) (None) Memory Current Status (J3354) (None) Memory Goal Status (T6256) (None) Memory Discharge Status (L8937) (None) Voice Current Status (D4287) (None) Voice Goal Status (G8115) (None) Voice Discharge Status [phone]) (None) Other Speech-Language Pathology Functional Limitation [phone]) (None) Other Speech-Language Pathology Functional Limitation Goal Status (C1638) (None) Other Speech-Language Pathology Functional Limitation Discharge Status [phone]) (None) Kern Reap, MA, CCC-SLP 08/05/2015, 1:12 PM [phone]              ASSESSMENT / PLAN:  Acute resp failure  -  Extubated  Acute exacerbation of bronchiectasis versus another aspiration episode -ct zosyn, dc vanc -follow  sputum culture and tailor antibiotics accordingly -dys 2 diet per swallow eval   Hemoptysis, likely related to above -  improved with antibiotics  COPD-no evidence of exacerbation,-He quit smoking in 2014 -Continue Pulmicort and DuoNeb nebs -dc solumedrol    Acute encephalopathy - related to sepsis, resolved Chronic pain/ bipolar - resume cymbalata Long standing unexplained dysphagia with intermittent diplopia - myasthenia abs sent -MRI brain + C spine was planned as outpt - can obtain now  Atrial fibrillation/RVR - new onset, although he states that Dr. Tamala Julian has noted this in the past , - recent nuclear stress test was a low-risk study with EF of 48%  - ct heparin gtt - can anticoagulate now that hemoptysis subsided (high CHADS) - have to d/w cards - change to oral Cardizem  - dc amiodarone   PT consult OK to transfer to t ele   Kara Mead MD. Shade Flood. [facility] Pager [phone] If no response call [phone]   08/06/2015

## 2015-08-06 NOTE — Progress Notes (Signed)
Nutrition Follow-up  DOCUMENTATION CODES:   Non-severe (moderate) malnutrition in context of chronic illness  INTERVENTION:   -Ensure Enlive po BID, each supplement provides 350 kcal and 20 grams of protein  NUTRITION DIAGNOSIS:   Malnutrition (moderate) related to chronic illness (COPD and cancer) as evidenced by mild depletion of body fat, moderate depletions of muscle mass.  Ongoing  GOAL:   Patient will meet greater than or equal to 90% of their needs  Progressing  MONITOR:   PO intake, Supplement acceptance, Labs, Weight trends, Skin, I & O's  REASON FOR ASSESSMENT:   Consult Enteral/tube feeding initiation and management  ASSESSMENT:   Pt with hx of RLL adenocarcinoma in 2016 Rx with SBRT with decrease in size of nodule, COPD, recurrent aspiration and bronchiectasis in RLL. Pt with multiple admissions in 2017 for recurrent aspiration PNA, on pureed diet at home. Admitted from pulmonary clinic with confusion and tachycardia and intubated.    Pt receiving nursing care at time of visit.   Pt was extubated on 08/04/15. Pt transferred from ICU to SDU on 08/05/15.   Pt underwent BSE on 08/04/15 and SLP recommended NPO due to increased risk and hx of aspiration. Pt underwent MBSS on 08/05/15 and was advanced to a dysphagia 2 diet with thin liquids. Meal completion 50%. RD will add Ensure supplement for increased nutrient provision and for consistency with home regimen.   Labs reviewed: Na: 134 (on IV supplementation), K: 3.3, CBGS: 98-124.   Diet Order:  DIET DYS 2 Room service appropriate?: Yes; Fluid consistency:: Thin  Skin:  Reviewed, no issues  Last BM:  08/06/15  Height:   Ht Readings from Last 1 Encounters:  08/03/15 '5\' 10"'$  (1.778 m)    Weight:   Wt Readings from Last 1 Encounters:  08/06/15 177 lb 3.2 oz (80.377 kg)    Ideal Body Weight:  75.4 kg  BMI:  Body mass index is 25.43 kg/(m^2).  Estimated Nutritional Needs:   Kcal:  1800-2000  Protein:   100-115 grams  Fluid:  1.8-2.0  EDUCATION NEEDS:   No education needs identified at this time  Javon Hupfer A. Jimmye Norman, RD, LDN, CDE Pager: [phone] After hours Pager: [phone]

## 2015-08-06 NOTE — Progress Notes (Signed)
  Echocardiogram 2D Echocardiogram has been performed.  Malik Zuniga 08/06/2015, 2:22 PM

## 2015-08-06 NOTE — Progress Notes (Signed)
eLink Physician-Brief Progress Note Patient Name: JILL RUPPE DOB: [birth year] MRN: [id number]   Date of Service  08/06/2015  HPI/Events of Note  Hypokalemia 3.3. Has CVL access. Creatinine 0.66.  eICU Interventions  KCl 59mq IV x4 runs     Intervention Category Intermediate Interventions: Electrolyte abnormality - evaluation and management  JTera Partridge6/26/2017, 4:40 AM

## 2015-08-06 NOTE — Care Management Important Message (Signed)
Important Message  Patient Details  Name: Malik Zuniga MRN: [id number] Date of Birth: [birth year]   Medicare Important Message Given:  Yes    [name] 08/06/2015, 8:45 AM

## 2015-08-06 NOTE — Progress Notes (Signed)
Speech Language Pathology Treatment: Dysphagia  Patient Details Name: Malik Zuniga MRN: [id number] DOB: [birth year] Today's Date: 08/06/2015 Time: 7408-1448 SLP Time Calculation (min) (ACUTE ONLY): 15 min  Assessment / Plan / Recommendation Clinical Impression  F/u diet tolerance assessment revealed what appears to be good tolerance of current diet. Patient initially required max verbal reminders for compensatory swallowing strategies taught following MBS 6/25 but then able to carry out during po trials with min verbal cueing. No overt indication of aspiration observed. Increased WOB noted at baseline increased slightly with self feeding activity, increasing aspiration risk. Recommend continuation of current diet and plan with close SLP f/u given need for strict precautions. Questioning how much possible CP bar noted on MBS is contributing to recurrent PNAs.    HPI HPI: Malik Zuniga is a 70 y.o. male with medical history significant of prostate (remote) and recent lung cancer; recent hospitalization and recurrent hospitalizations for aspiration PNA. He was hospitalized 1 month ago for a week for aspiration pneumonia. Was seen today for routine hospital f/u with pulm today and sent to [facility] ER. Wife reports that he started getting sick last Monday night and has gotten progressively worse. Started with generalized weakness, generalized pain, decreased PO intake, and cough. +hemoptysis, started last night (no history of prior). Has had persistent PNA since October, per wife report. Has taken Ensure but minimal food over the last few days. She has worked hard on giving him only a soft mechanical diet since last hospitalization.  Cough was productive of brown sputum yesterday. +SOB, more than usual. +nasal congestion. No CP/palpitations.  Very, very weak - much more difficulty with mobility. Increased back pain since last fall.  Pt is known to ST service from previous MBS.  Most recent was  conducted on 07/05/2015 with recommendation for a dysphagia 1 diet with nectar thick liquids.  The patient was seen once for oupt follow up. Most recent chest x-ray is showing streaky left basilar atelectasis vs infiltrate.       SLP Plan  Continue with current plan of care     Recommendations  Diet recommendations: Dysphagia 2 (fine chop);Thin liquid Liquids provided via: Cup;No straw Medication Administration: Crushed with puree Supervision: Patient able to self feed;Full supervision/cueing for compensatory strategies Compensations: Slow rate;Small sips/bites;Multiple dry swallows after each bite/sip;Clear throat intermittently Postural Changes and/or Swallow Maneuvers: Seated upright 90 degrees             Oral Care Recommendations: Oral care BID Follow up Recommendations: Outpatient SLP (TBD) Plan: Continue with current plan of care     Melrose Moorcroft, [place] [phone]    Kraig Genis Meryl 08/06/2015, 4:05 PM

## 2015-08-06 NOTE — Progress Notes (Signed)
Pt arrived to floor accompanied by 2 RN from [facility]. He's alert and oriented and no complaints or distress voiced or noted. Wife currently in room with pt. Call bell within reach. Vitals stable.

## 2015-08-06 NOTE — Progress Notes (Signed)
Patient Name: Malik Zuniga Date of Encounter: 08/06/2015  Active Problems:   Bipolar disorder ([place])   Essential hypertension   HCAP (healthcare-associated pneumonia)   Recurrent aspiration pneumonia ([place])   Primary cancer of right lower lobe of lung ([place])   Sepsis ([place])   Aspiration pneumonia ([place])   Atrial flutter with rapid ventricular response (H[facility])   COPD (chronic obstructive pulmonary disease) ([place])   Acute respiratory failure ([place])   Acute on chronic diastolic CHF (congestive heart failure), NYHA class 4 ([place])   Primary Cardiologist: Dr Tamala Julian   Patient Profile: 70 year old male with complex past medical history including 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 Extubated 06/24  SUBJECTIVE: Pt feels anxious, takes Valium tid at home. Breathing well. No cough. Not really aware heart is back in rhythm  OBJECTIVE Filed Vitals:   08/05/15 2300 08/06/15 0205 08/06/15 0318 08/06/15 0737  BP: 126/81  127/76 113/67  Pulse: 81  80 77  Temp: 98.4 F (36.9 C)  98.6 F (37 C) 98.1 F (36.7 C)  TempSrc: Oral  Oral Oral  Resp: '20  19 19  '$ Height:      Weight:   177 lb 3.2 oz (80.377 kg)   SpO2: 97% 97% 97% 95%    Intake/Output Summary (Last 24 hours) at 08/06/15 0749 Last data filed at 08/06/15 0700  Gross per 24 hour  Intake 1537.52 ml  Output   1050 ml  Net 487.52 ml   Filed Weights   08/04/15 0246 08/05/15 0332 08/06/15 0318  Weight: 176 lb 5.9 oz (80 kg) 173 lb 15.1 oz (78.9 kg) 177 lb 3.2 oz (80.377 kg)    PHYSICAL EXAM General: Well developed, well nourished, male in no acute distress. Head: Normocephalic, atraumatic.  Neck: Supple without bruits, JVD not able to assess 2nd equipment, habitus. Lungs:  Resp regular and unlabored, rales bases. Heart: RRR, S1, S2, no S3, S4, or murmur; no rub. Abdomen: Soft,  non-tender, non-distended, BS + x 4.  Extremities: No clubbing, cyanosis, edema.  Neuro: Alert and oriented X 3. Moves all extremities spontaneously.  LABS: CBC: Recent Labs  08/04/15 0330 08/05/15 0325 08/06/15 0340  WBC 8.1 8.1 6.9  NEUTROABS 7.3  --   --   HGB 8.1* 8.5* 8.6*  HCT 25.3* 26.8* 26.5*  MCV 84.6 84.0 83.1  PLT 421* 470* 650*   Basic Metabolic Panel: Recent Labs  08/04/15 0750 08/04/15 1658 08/05/15 0325 08/06/15 0340  NA  --   --  137 134*  K  --   --  3.9 3.3*  CL  --   --  103 101  CO2  --   --  25 27  GLUCOSE  --   --  142* 101*  BUN  --   --  7 7  CREATININE  --   --  0.58* 0.66  CALCIUM  --   --  8.4* 8.4*  MG 1.8 1.8  --   --   PHOS 2.1* 1.9*  --   --     TELE: Atrial fib>>SR 06/25 pm  Radiology/Studies: Dg Chest Port 1 View 08/05/2015  CLINICAL DATA:  Acute respiratory failure EXAM: PORTABLE CHEST 1 VIEW COMPARISON:  08/03/2015 FINDINGS: Cardiomediastinal silhouette is stable. Endotracheal tube has been removed. Right IJ central line is unchanged in position. No pulmonary edema. Persistent small left  pleural effusion with left basilar atelectasis or infiltrate. No pneumothorax. IMPRESSION: Endotracheal tube has been removed. No pneumothorax. Stable right adjacent line position. Small left pleural effusion weight left basilar atelectasis or infiltrate. Electronically Signed   By: Lahoma Crocker M.D.   On: 08/05/2015 09:17   Dg Swallowing Func-speech Pathology 08/05/2015  Objective Swallowing Evaluation: Type of Study: MBS-Modified Barium Swallow Study Procedure: ESOPHAGOGASTRODUODENOSCOPY (EGD);  Surgeon: Jeryl Columbia, MD;  Location: [facility];  Service: Endoscopy;  Laterality: N/A;  buccini /ja . Colonoscopy   . Back surgery  05/2013 . Dg biopsy lung     Assessment / Plan / Recommendation CHL IP CLINICAL IMPRESSIONS 08/05/2015 Therapy Diagnosis Moderate oral phase dysphagia;Mild pharyngeal phase dysphagia Clinical Impression Pt currently presenting with  a mild-moderate oropharyngeal dysphagia.  Dysphagia characteristics similar to recent study on 07/05/15; however, today silent aspiration was observed x1 with thin liquids by straw- pt continues to have poor bolus containment resulting in premature spill to the level of the pyriform sinuses prior to swallow initiation. No penetration or aspiration observed with multiple trials of thin liquid by cup with pt cued to "take a small sip". Across consistencies pt with mild-moderate residuals at the level of vallecula and pyriform sinuses which appears to be due to pt's head/ neck position being slightly downward, along with decreased anterior hyolaryngeal movement; most of residuals were cleared when pt cued to swallow again. Was difficult to visualize UES for most of study due to positioning; however, was able to see following a puree bolus with which there was the appearance of a cricopharyngeal bar with minimal residuals. Recommend initiating dysphagia 2 diet with thin liquids by cup- no straws, meds whole in puree, full supervision to cue pt to take small bites/ sips, multiple swallows, and intermittent throat clear. Ensure pt is sitting upright during meals looking straight ahead and not leaning forward; remain upright 30-60 minutes after meal. Will continue to follow closely for diet tolerance.  Impact on safety and function Moderate aspiration risk   CHL IP CHL IP DIET RECOMMENDATION 08/05/2015 SLP Diet Recommendations Dysphagia 2 (Fine chop) solids;Thin liquid Liquid Administration via Cup;No straw Medication Administration Whole meds with puree Compensations Slow rate;Small sips/bites;Multiple dry swallows after each bite/sip;Clear throat intermittently Postural Changes Seated upright at 90 degrees   CHL IP OTHER RECOMMENDATIONS 08/05/2015 Recommended Consults -- Oral Care Recommendations Oral care BID Other Recommendations --   CHL IP FOLLOW UP RECOMMENDATIONS 08/05/2015 Follow up Recommendations Outpatient SLP    CHL IP FREQUENCY AND DURATION 08/05/2015 Speech Therapy Frequency (ACUTE ONLY) min 2x/week Treatment Duration 2 weeks      CHL IP ORAL PHASE 08/05/2015 Oral Phase Impaired Oral - Pudding Teaspoon -- Oral - Pudding Cup -- Oral - Honey Teaspoon -- Oral - Honey Cup -- Oral - Nectar Teaspoon -- Oral - Nectar Cup -- Oral - Nectar Straw -- Oral - Thin Teaspoon -- Oral - Thin Cup Premature spillage;Decreased bolus cohesion Oral - Thin Straw Decreased bolus cohesion;Premature spillage Oral - Puree Decreased bolus cohesion;Premature spillage Oral - Mech Soft Decreased bolus cohesion;Premature spillage Oral - Regular -- Oral - Multi-Consistency -- Oral - Pill -- Oral Phase - Comment --  CHL IP PHARYNGEAL PHASE 08/05/2015 Pharyngeal Phase Impaired Pharyngeal- Pudding Teaspoon -- Pharyngeal -- Pharyngeal- Pudding Cup -- Pharyngeal -- Pharyngeal- Honey Teaspoon -- Pharyngeal -- Pharyngeal- Honey Cup -- Pharyngeal -- Pharyngeal- Nectar Teaspoon -- Pharyngeal -- Pharyngeal- Nectar Cup -- Pharyngeal -- Pharyngeal- Nectar Straw -- Pharyngeal -- Pharyngeal- Thin Teaspoon --  Pharyngeal -- Pharyngeal- Thin Cup Delayed swallow initiation-pyriform sinuses;Reduced anterior laryngeal mobility;Pharyngeal residue - valleculae;Pharyngeal residue - pyriform Pharyngeal -- Pharyngeal- Thin Straw Delayed swallow initiation-pyriform sinuses;Reduced anterior laryngeal mobility;Penetration/Aspiration before swallow;Pharyngeal residue - valleculae;Pharyngeal residue - pyriform Pharyngeal Material enters airway, passes BELOW cords without attempt by patient to eject out (silent aspiration) Pharyngeal- Puree Delayed swallow initiation-vallecula;Reduced anterior laryngeal mobility;Pharyngeal residue - pyriform;Pharyngeal residue - valleculae Pharyngeal -- Pharyngeal- Mechanical Soft Delayed swallow initiation-vallecula;Reduced anterior laryngeal mobility;Pharyngeal residue - valleculae;Pharyngeal residue - pyriform Pharyngeal -- Pharyngeal- Regular --  Pharyngeal -- Pharyngeal- Multi-consistency -- Pharyngeal -- Pharyngeal- Pill -- Pharyngeal -- Pharyngeal Comment --  CHL IP CERVICAL ESOPHAGEAL PHASE 08/05/2015 Cervical Esophageal Phase Impaired Pudding Teaspoon -- Pudding Cup -- Honey Teaspoon -- Honey Cup -- Nectar Teaspoon -- Nectar Cup -- Nectar Straw -- Thin Teaspoon -- Thin Cup -- Thin Straw -- Puree -- Mechanical Soft -- Regular -- Multi-consistency -- Pill -- Cervical Esophageal Comment -- CHL IP GO 08/04/2015 Functional Assessment Tool Used ASHA NOMS and clinical judgment.   Functional Limitations Swallowing Swallow Current Status [id number]) CM Swallow Goal Status (I3474) CK Swallow Discharge Status (Q5956) (None) Motor Speech Current Status [phone]) (None) Motor Speech Goal Status [phone]) (None) Motor Speech Goal Status [phone]) (None) Spoken Language Comprehension Current Status [phone]) (None) Spoken Language Comprehension Goal Status (T0160) (None) Spoken Language Comprehension Discharge Status [phone]) (None) Spoken Language Expression Current Status [phone]) (None) Spoken Language Expression Goal Status [phone]) (None) Spoken Language Expression Discharge Status [phone]) (None) Attention Current Status (C3762) (None) Attention Goal Status (G3151) (None) Attention Discharge Status [phone]) (None) Memory Current Status (V3710) (None) Memory Goal Status (G2694) (None) Memory Discharge Status (W5462) (None) Voice Current Status (V0350) (None) Voice Goal Status (K9381) (None) Voice Discharge Status [phone]) (None) Other Speech-Language Pathology Functional Limitation [phone]) (None) Other Speech-Language Pathology Functional Limitation Goal Status (V8938) (None) Other Speech-Language Pathology Functional Limitation Discharge Status [phone]) (None) Kern Reap, MA, CCC-SLP 08/05/2015, 1:12 PM [phone]               Current Medications:  . antiseptic oral rinse  7 mL Mouth Rinse q12n4p  . ARIPiprazole  5 mg Oral Daily  . budesonide  0.5 mg Nebulization BID  .  chlorhexidine  15 mL Mouth Rinse BID  . divalproex  500 mg Oral Q12H  . docusate sodium  200 mg Oral QHS  . insulin aspart  0-9 Units Subcutaneous Q4H  . ipratropium  0.5 mg Nebulization Q6H  . levalbuterol  0.63 mg Nebulization Q6H  . methylPREDNISolone (SOLU-MEDROL) injection  40 mg Intravenous Daily  . metoprolol  5 mg Intravenous Once  . metoprolol tartrate  50 mg Oral BID  . pantoprazole  40 mg Oral Daily  . piperacillin-tazobactam (ZOSYN)  IV  3.375 g Intravenous Q8H  . potassium chloride  10 mEq Intravenous Q1 Hr x 4  . primidone  250 mg Oral BID  . simvastatin  40 mg Oral q1800  . sodium chloride flush  10-40 mL Intracatheter Q12H  . sodium chloride flush  10-40 mL Intracatheter Q12H   . sodium chloride    . amiodarone 30 mg/hr (08/05/15 1945)  . diltiazem (CARDIZEM) infusion 15 mg/hr (08/05/15 2228)  . heparin 1,350 Units/hr (08/06/15 0128)    ASSESSMENT AND PLAN: 1 Atrial flutter- s/p spontaneous conversion to SR; chadsvasc 5; however no anticoagulation due to hemoptysis. Will change Cardizem to '240mg'$  daily; metoprolol to 50 mg po BID.  check echo.  2 pneumonia/bronchiectasis-continue antibiotics per CCM  3 COPD -  no wheezing, per CCM  4 HTN-hold cozaar given need for cardizem and metoprolol for atrial flutter.  Otherwise, per CCM Active Problems:   Bipolar disorder ([place])   Essential hypertension   HCAP (healthcare-associated pneumonia)   Recurrent aspiration pneumonia ([place])   Primary cancer of right lower lobe of lung (HCC)   Sepsis ([place])   Aspiration pneumonia ([place])   Atrial flutter with rapid ventricular response (HCC)   COPD (chronic obstructive pulmonary disease) ([place])   Acute respiratory failure (HCC)   Acute on chronic diastolic CHF (congestive heart failure), NYHA class 4 ([place])   Signed, Barrett, Rhonda , PA-C 7:49 AM 08/06/2015  Patient seen, examined. Available data reviewed. Agree with findings, assessment, and plan as outlined by Rosaria Ferries, PA-C. See my separate note.  Sherren Mocha, M.D. 08/06/2015 8:36 AM
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
## 2015-08-06 NOTE — Progress Notes (Addendum)
    Subjective:  No chest pain or shortness of breath. Feeling better today. Wife at bedside.   Objective:  Vital Signs in the last 24 hours: Temp:  [97.5 F (36.4 C)-98.6 F (37 C)] 98.1 F (36.7 C) (06/26 0737) Pulse Rate:  [77-143] 77 (06/26 0737) Resp:  [18-26] 19 (06/26 0737) BP: (106-139)/(67-97) 113/67 mmHg (06/26 0737) SpO2:  [92 %-100 %] 95 % (06/26 0737) Weight:  [177 lb 3.2 oz (80.377 kg)] 177 lb 3.2 oz (80.377 kg) (06/26 0318)  Intake/Output from previous day: 06/25 0701 - 06/26 0700 In: 1737.5 [P.O.:180; I.V.:1057.5; IV Piggyback:500] Out: 1050 [Urine:1050]  Physical Exam: Pt is alert and oriented, NAD, pleasant male HEENT: normal Neck: JVP - normal Lungs: CTA bilaterally CV: RRR without murmur or gallop Abd: soft, NT, Positive BS Ext: no C/C/E, distal pulses intact and equal Skin: warm/dry no rash   Lab Results:  Recent Labs  08/05/15 0325 08/06/15 0340  WBC 8.1 6.9  HGB 8.5* 8.6*  PLT 470* 465*    Recent Labs  08/05/15 0325 08/06/15 0340  NA 137 134*  K 3.9 3.3*  CL 103 101  CO2 25 27  GLUCOSE 142* 101*  BUN 7 7  CREATININE 0.58* 0.66   No results for input(s): TROPONINI in the last 72 hours.  Invalid input(s): CK, MB  Tele: Atrial flutter converted to NSR last night approximately 9pm, maintaining sinus this am  Assessment/Plan:  Atrial flutter with RVR: now converted to sinus rhythm on amio/dilt infusions and metoprolol orally. Not a candidate for anticoagulation with hemoptysis this admission. Will convert IV diltiazem to PO. I think best to stop amiodarone as this is not a good maintenance drug for him with recurrent aspiration/pulmonary problems. Check echo today. Overall seems to be improving clinically. Will continue to follow with you. Patient/family questions answered.    Sherren Mocha, M.D. 08/06/2015, 8:10 AM

## 2015-08-06 NOTE — Progress Notes (Signed)
Rehab Admissions Coordinator Note:  Patient was screened by Cleatrice Burke for appropriateness for an Inpatient Acute Rehab Consult per PT recommendation.  At this time, we are recommending Inpatient Rehab consult. Please place order if you feel appropriate.  Cleatrice Burke 08/06/2015, 2:03 PM  I can be reached at [phone].

## 2015-08-06 NOTE — Progress Notes (Addendum)
[place] for Heparin Indication: atrial fibrillation  Allergies  Allergen Reactions  . Lisinopril Swelling    ANGIOEDEMA  . Lamictal [Lamotrigine] Other (See Comments)    Weakness/difficulty swallowing  . Ambien [Zolpidem] Other (See Comments)    Unknown reaction   Patient Measurements: Height: '5\' 10"'$  (177.8 cm) Weight: 177 lb 3.2 oz (80.377 kg) IBW/kg (Calculated) : 73 Heparin Dosing Weight: 78.9kg  Vital Signs: Temp: 98.1 F (36.7 C) (06/26 0737) Temp Source: Oral (06/26 0737) BP: 113/67 mmHg (06/26 0737) Pulse Rate: 77 (06/26 0737)  Labs:  Recent Labs  08/04/15 0330 08/05/15 0325 08/05/15 1825 08/06/15 0340  HGB 8.1* 8.5*  --  8.6*  HCT 25.3* 26.8*  --  26.5*  PLT 421* 470*  --  465*  HEPARINUNFRC  --   --  0.12* 0.28*  CREATININE 0.67 0.58*  --  0.66   Estimated Creatinine Clearance: 88.7 mL/min (by C-G formula based on Cr of 0.66).   Assessment: 70 yo male with multiple medical problems admitted with hemoptysis.  He was started on heparin for atrial flutter and has now converted to NSR.  Hg stable and hemoptysis appears to have resolved.  -Heparin level  Is 0.28 and below goal   Goal of Therapy:  Heparin Level ~ 0.3-0.5 given recent hemoptysis Monitor platelets by anticoagulation protocol: Yes   Plan:  - Increase heparin to 1450 units/hr - Daily CBC/heparin levels -Will follow anticoagulation plans  Hildred Laser, Pharm D 08/06/2015 8:17 AM   Addendum -changes to lovenox -Discussed with Dr. Elsworth Soho: plans are lovenox today then change a DOAC on 6/27  -CrCl ~ 90  Plan -discontinue heparin -lovenox '80mg'$  sq q12h  Hildred Laser, Pharm D 08/06/2015 10:02 AM

## 2015-08-07 ENCOUNTER — Ambulatory Visit: Payer: Medicare Other | Admitting: Physical Therapy

## 2015-08-07 ENCOUNTER — Encounter (HOSPITAL_COMMUNITY): Payer: Self-pay | Admitting: Internal Medicine

## 2015-08-07 DIAGNOSIS — E440 Moderate protein-calorie malnutrition: Secondary | ICD-10-CM | POA: Diagnosis present

## 2015-08-07 DIAGNOSIS — C3431 Malignant neoplasm of lower lobe, right bronchus or lung: Secondary | ICD-10-CM

## 2015-08-07 DIAGNOSIS — R0682 Tachypnea, not elsewhere classified: Secondary | ICD-10-CM

## 2015-08-07 DIAGNOSIS — G8929 Other chronic pain: Secondary | ICD-10-CM | POA: Insufficient documentation

## 2015-08-07 DIAGNOSIS — I480 Paroxysmal atrial fibrillation: Secondary | ICD-10-CM

## 2015-08-07 DIAGNOSIS — Z8673 Personal history of transient ischemic attack (TIA), and cerebral infarction without residual deficits: Secondary | ICD-10-CM

## 2015-08-07 DIAGNOSIS — D649 Anemia, unspecified: Secondary | ICD-10-CM

## 2015-08-07 DIAGNOSIS — E876 Hypokalemia: Secondary | ICD-10-CM

## 2015-08-07 DIAGNOSIS — I672 Cerebral atherosclerosis: Secondary | ICD-10-CM

## 2015-08-07 DIAGNOSIS — H532 Diplopia: Secondary | ICD-10-CM

## 2015-08-07 DIAGNOSIS — M5441 Lumbago with sciatica, right side: Secondary | ICD-10-CM

## 2015-08-07 DIAGNOSIS — M5442 Lumbago with sciatica, left side: Secondary | ICD-10-CM

## 2015-08-07 DIAGNOSIS — E871 Hypo-osmolality and hyponatremia: Secondary | ICD-10-CM

## 2015-08-07 DIAGNOSIS — F3170 Bipolar disorder, currently in remission, most recent episode unspecified: Secondary | ICD-10-CM

## 2015-08-07 DIAGNOSIS — R1310 Dysphagia, unspecified: Secondary | ICD-10-CM

## 2015-08-07 DIAGNOSIS — F4323 Adjustment disorder with mixed anxiety and depressed mood: Secondary | ICD-10-CM

## 2015-08-07 DIAGNOSIS — I2510 Atherosclerotic heart disease of native coronary artery without angina pectoris: Secondary | ICD-10-CM

## 2015-08-07 DIAGNOSIS — R410 Disorientation, unspecified: Secondary | ICD-10-CM | POA: Diagnosis present

## 2015-08-07 DIAGNOSIS — J479 Bronchiectasis, uncomplicated: Secondary | ICD-10-CM

## 2015-08-07 LAB — CBC
HEMATOCRIT: 28.3 % — AB (ref 39.0–52.0)
Hemoglobin: 9 g/dL — ABNORMAL LOW (ref 13.0–17.0)
MCH: 26.6 pg (ref 26.0–34.0)
MCHC: 31.8 g/dL (ref 30.0–36.0)
MCV: 83.7 fL (ref 78.0–100.0)
PLATELETS: 483 10*3/uL — AB (ref 150–400)
RBC: 3.38 MIL/uL — AB (ref 4.22–5.81)
RDW: 16 % — ABNORMAL HIGH (ref 11.5–15.5)
WBC: 6.6 10*3/uL (ref 4.0–10.5)

## 2015-08-07 LAB — GLUCOSE, CAPILLARY
GLUCOSE-CAPILLARY: 105 mg/dL — AB (ref 65–99)
GLUCOSE-CAPILLARY: 52 mg/dL — AB (ref 65–99)
GLUCOSE-CAPILLARY: 72 mg/dL (ref 65–99)
GLUCOSE-CAPILLARY: 97 mg/dL (ref 65–99)
Glucose-Capillary: 74 mg/dL (ref 65–99)
Glucose-Capillary: 77 mg/dL (ref 65–99)
Glucose-Capillary: 99 mg/dL (ref 65–99)

## 2015-08-07 LAB — CULTURE, BLOOD (ROUTINE X 2)
Culture: NO GROWTH
Culture: NO GROWTH

## 2015-08-07 MED ORDER — HYDROCODONE-ACETAMINOPHEN 10-325 MG PO TABS
1.0000 | ORAL_TABLET | Freq: Four times a day (QID) | ORAL | Status: DC | PRN
  Administered 2015-08-07 – 2015-08-09 (×4): 1 via ORAL
  Filled 2015-08-07 (×4): qty 1

## 2015-08-07 MED ORDER — DABIGATRAN ETEXILATE MESYLATE 150 MG PO CAPS
150.0000 mg | ORAL_CAPSULE | Freq: Two times a day (BID) | ORAL | Status: DC
  Administered 2015-08-07 – 2015-08-09 (×5): 150 mg via ORAL
  Filled 2015-08-07 (×5): qty 1

## 2015-08-07 NOTE — Consult Note (Addendum)
Physical Medicine and Rehabilitation Consult   Reason for Consult: Debility Referring Physician: Dr. Rockne Menghini   HPI: Malik Zuniga is a 70 y.o. male with history of lung cancer, COPD with recurrent aspiration PNA with recurrent admissions, bronchiectasis of RLL, chronic back pain with L5 radiculopathy treated with narcotics, bipolar disorder with anxiety, who was admitted on 08/02/15 from MD office with confusion, hallucinations, generalized pain with weakness, poor po intake and cough. He was admitted via MD office on 08/02/15 and was found to be septic with elevated lactate and was found to have A fib with RVR. He was started on IV antibiotics and IV Cardizem. Cardiology consulted for input--patient not candidate for anticoagulation due to hemoptysis. Recent cardiac stress test without evidence of ischemia.  He was started on IV amiodarone as well as digoxin for rate control with question of DCCV is necessary.    Patient had copious secretions with anxiety and respiratory failure requiring intubation from 06/24 to 6/26.  Neurology consulted for input on chronic dysphagia and question of neuromuscular respiratory failure --question myopathy. He has been seen by multiple neurologist and gastroenterologists in the past with negative EMG studies. Patient with reports of diplopia and dysphagia and Dr. Aaron Edelman questioned Myasthenia Gravis. Acetylcholine receptor AB negative and MRI head/neck recommended once patient extubated. Respiratory status improving and IV heparin initiated 6/25 as hemoptysis resolved. MBS done for objective evaluation and patient placed on dysphagia 2, thin with strict aspiration precautions. 2D echo 6/26 with EF 50-55%, RV dilated with probable moderate systolic dysfunction and no wall abnormality.  Amiodarone discontinued and currently in NSR. Cleared by PCCM to start pradaxa due to high CHADS-Vasc score. PT evaluation done yesterday and patient noted to be debilitated.   and CIR recommended by rehab team.     Patient required min assist with ADL tasks and was able to ambulate short distances with RW. Per wife "lives in the recliner and walks short distances to the BR."  Multiple falls due to right knee instability and RLE numbness. Uses walker --has new upright AFO that helps with knee stability.  Walking and standing tolerance due to back pain and respiratory status. Completed outpatient PT 6/14 and outpatient ST ongoing.     Review of Systems  HENT: Negative for hearing loss.   Eyes: Positive for double vision.  Respiratory: Positive for cough and shortness of breath.   Cardiovascular: Negative for chest pain and palpitations.  Gastrointestinal: Negative for heartburn, abdominal pain and constipation.  Genitourinary: Negative for urgency.  Musculoskeletal: Positive for myalgias, back pain and joint pain.  Skin: Negative for rash.  Neurological: Positive for tremors and weakness. Negative for headaches.  Psychiatric/Behavioral: The patient has insomnia.   All other systems reviewed and are negative.     Past Medical History  Diagnosis Date  . Hyperlipidemia     takes Simvastatin daily  . Tremor   . History of prostate cancer 2004  . On home O2   . Neuromuscular disorder ([place]) 1998    right carpal tunnel release  . Anemia associated with acute blood loss   . GERD (gastroesophageal reflux disease)     takes Omeprazole daily  . Hypertension     takes Metoprolol daily as well as Hyzaar  . Constipation     takes Colace daily as well as Miralax  . Anxiety     takes Valium daily  . Depression     takes Cymbalta daily  . Emphysema of lung ([place])  Albuterol as needed;Symbicort daily and Singulair at bedtime  . Myocardial infarction ([place]) 04/1998  . Coronary artery disease   . Asthma   . Shortness of breath     with exertion  . History of bronchitis   . Aspiration pneumonia ([place]) 2010  . Headache, chronic daily   . History of migraine      last migraine a couple of days ago;takes Excedrin Migraine  . Chronic back pain     compression fracture  . History of colon polyps   . Mood change ([place])     after Brain surgery mood changed and was placed on Depakote  . Insomnia     takes Benadryl nightly  . 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 Lung cancer ([place]) 2016    Past Surgical History  Procedure Laterality Date  . Cholecystectomy  1996  . 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 Annette Stable  . Coronary angioplasty with stent placement  2000    RCA stent, Dr. Rollene Fare; denies stent for PAD 06/15/13  . Hand surgery  1989    crushed thumb, Dr. Fredna Dow  . Ulnar nerve repair  1998    left arm, Dr. Fredna Dow  . Gastrostomy w/ feeding tube  2008    Dr. Watt Climes; only had for a few months  . Prostatectomy  04/2001    removal of prostate cancer, Dr. Janice Norrie  . Coronary angioplasty with stent placement  04/1998  . Carpal tunnel release  1998    right  . Spine surgery    . 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 Back surgery  08/2004; 02/2005; 04/2006; 06/2007; 7/20101    all by Dr. Annette Stable  . Esophagogastroduodenoscopy N/A 07/23/2012    Procedure: ESOPHAGOGASTRODUODENOSCOPY (EGD);  Surgeon: Jeryl Columbia, MD;  Location: [facility] ENDOSCOPY;  Service: Endoscopy;  Laterality: N/A;  buccini /ja  . Colonoscopy    . Back surgery  05/2013  . Dg biopsy lung      Family History  Problem Relation Age of Onset  . Heart disease Mother   . Cancer Father     brain cancer and prostate cancer      Social History:  Married. Independent but sedentary PTA. Per reports that he quit smoking about 3 years ago. His smoking use included Cigarettes. He has a 78 pack-year smoking history. He has never used smokeless tobacco. Per reports that he uses illicit drugs ([place]). He reports that he does not drink alcohol.   Allergies  Allergen Reactions  . Lisinopril Swelling    ANGIOEDEMA  . Lamictal [Lamotrigine] Other (See Comments)    Weakness/difficulty swallowing  .  Ambien [Zolpidem] Other (See Comments)    Unknown reaction   Medications Prior to Admission  Medication Sig Dispense Refill  . ARIPiprazole (ABILIFY) 10 MG tablet Take 5 mg by mouth daily.     . Ascorbic Acid (VITAMIN C) 1000 MG tablet Take 1,000 mg by mouth daily.    Marland Kitchen aspirin-acetaminophen-caffeine (EXCEDRIN MIGRAINE) 250-250-65 MG per tablet Take 2 tablets by mouth every 6 (six) hours as needed. Reported on 07/30/2015    . budesonide (PULMICORT) 0.25 MG/2ML nebulizer solution Take 4 mLs (0.5 mg total) by nebulization 2 (two) times daily. 60 mL 3  . cholecalciferol (VITAMIN D) 1000 UNITS tablet Take 1,000 Units by mouth every morning.     . diazepam (VALIUM) 5 MG tablet Take 1 tablet (5 mg total) by mouth every 8 (eight)  hours as needed for anxiety or muscle spasms. scheduled 30 tablet 0  . divalproex (DEPAKOTE) 500 MG 24 hr tablet Take 1,000 mg by mouth at bedtime.     . docusate sodium (COLACE) 100 MG capsule Take 200 mg by mouth at bedtime.     . DULoxetine (CYMBALTA) 60 MG capsule Take 120 mg by mouth daily.     . fluticasone (FLONASE) 50 MCG/ACT nasal spray PLACE 2 SPRAYS INTO BOTH NOSTRILS DAILY. (Patient taking differently: PLACE 2 SPRAYS INTO BOTH NOSTRILS DAILY as needed for allergies) 16 g 2  . HYDROcodone-acetaminophen (NORCO) 10-325 MG per tablet Take 1 tablet by mouth every 6 (six) hours as needed for moderate pain. for pain  0  . ipratropium-albuterol (DUONEB) 0.5-2.5 (3) MG/3ML SOLN Take 3 mLs by nebulization every 6 (six) hours. 360 mL 3  . losartan (COZAAR) 50 MG tablet Take 1 tablet (50 mg total) by mouth daily. (Patient taking differently: Take 50 mg by mouth at bedtime. ) 30 tablet 6  . metoprolol succinate (TOPROL-XL) 50 MG 24 hr tablet TAKE 1 TABLET (50 MG TOTAL) BY MOUTH EVERY MORNING. TAKE WITH OR IMMEDIATELY FOLLOWING A MEAL. 90 tablet 1  . montelukast (SINGULAIR) 10 MG tablet Take 1 tablet (10 mg total) by mouth at bedtime. 90 tablet 1  . omeprazole (PRILOSEC) 40 MG  capsule Take 40 mg by mouth 2 (two) times daily.     . polyethylene glycol (MIRALAX / GLYCOLAX) packet Take 17 g by mouth 3 (three) times daily as needed for moderate constipation. For constipation    . primidone (MYSOLINE) 250 MG tablet TAKE 1 TABLET (250 MG TOTAL) BY MOUTH 2 (TWO) TIMES DAILY. 180 tablet 1  . promethazine (PHENERGAN) 25 MG tablet Take 25 mg by mouth every 6 (six) hours as needed for nausea or vomiting. Reported on 04/06/2015  1  . ranitidine (ZANTAC) 150 MG tablet Take 150-300 mg by mouth 2 (two) times daily as needed for heartburn.    . simvastatin (ZOCOR) 40 MG tablet TAKE 1 TABLET (40 MG TOTAL) BY MOUTH EVERY EVENING. 90 tablet 3  . tiZANidine (ZANAFLEX) 4 MG tablet TAKE 1 TABLET BY MOUTH EVERY 8 HOURS AS NEEDED FOR MUSCLE SPASMS. 90 tablet 1  . Zinc 50 MG TABS Take 50 mg by mouth daily.     Marland Kitchen Respiratory Therapy Supplies (FLUTTER) DEVI Use as directed. (Patient not taking: Reported on 08/02/2015) 1 each 0    Home: Home Living Family/patient expects to be discharged to:: Private residence Living Arrangements: Spouse/significant other Available Help at Discharge: Family, Available 24 hours/day Type of Home: House Home Access: Stairs to enter [employer] of Steps: 5 Entrance Stairs-Rails: Right Home Layout: One level Bathroom Shower/Tub: Chiropodist: Standard Home Equipment: Environmental consultant - 2 wheels, Environmental consultant - 4 wheels, [place] - single point, Bedside commode, Grab bars - tub/shower, Tub bench, Wheelchair - manual  Functional History: Prior Function Level of Independence: Needs assistance Gait / Transfers Assistance Needed: Uses WC in the community w/ wife pushing.  Ambualtes using RW.  Uses cane when going up steps into home forward, descends sideways. ADL's / Homemaking Assistance Needed: Needs assist from wife to adjust shower head and to clean his feet.  Needs assist w/ shoes/socks. Comments: Pt has Rt AFO (currently in the car, wife to bring  next time she returns to the hospital) Functional Status:  Mobility: Bed Mobility Overal bed mobility: Needs Assistance Bed Mobility: Supine to Sit Supine to sit: Mod assist General  bed mobility comments: Assist provided to trunk due to poor trunk control w/ pt spontaneously rolling backward when attempting supine>sit. Transfers Overall transfer level: Needs assistance Equipment used: Rolling walker (2 wheeled) Transfers: Sit to/from Stand Sit to Stand: Min assist General transfer comment: Assist to steady and cues for hand placement. Ambulation/Gait Ambulation/Gait assistance: Min assist Ambulation Distance (Feet): 40 Feet Assistive device: Rolling walker (2 wheeled) Gait Pattern/deviations: Decreased stride length, Decreased dorsiflexion - right, Decreased dorsiflexion - left, Trunk flexed, Narrow base of support, Staggering left, Staggering right, Antalgic, Steppage General Gait Details: Cues for upright posture which pt is able to improve upon but not sustain.  SpO2 remains in mid-high 90s on 3L O2.  Pt fatigues after ambulating 40 ft.  Min assist to steady w/ chair follow. Gait velocity interpretation: Below normal speed for age/gender    ADL:    Cognition: Cognition Overall Cognitive Status: Impaired/Different from baseline Orientation Level: Oriented to person, Oriented to place, Oriented to situation Cognition Arousal/Alertness: Awake/alert Behavior During Therapy: WFL for tasks assessed/performed Overall Cognitive Status: Impaired/Different from baseline Area of Impairment: Orientation Orientation Level: Time  Blood pressure 144/87, pulse 85, temperature 97.8 F (36.6 C), temperature source Oral, resp. rate 22, height '5\' 10"'$  (1.778 m), weight 80.332 kg (177 lb 1.6 oz), SpO2 95 %.    Physical Exam  Nursing note and vitals reviewed. Constitutional: He is oriented to person, place, and time. He appears well-developed. He has a sickly appearance.  Flexed posture  --back curved to the left.   HENT:  Head: Normocephalic and atraumatic.  Eyes: Conjunctivae and EOM are normal. Pupils are equal, round, and reactive to light.  Neck: Decreased range of motion present.  Cardiovascular: Normal rate and regular rhythm.   Respiratory: Effort normal. No stridor. No respiratory distress. He has wheezes. He exhibits no tenderness.  +  GI: Soft. Bowel sounds are normal. He exhibits no distension. There is no tenderness.  Musculoskeletal: He exhibits no edema or tenderness.  Distal 2nd digit amputation RUE  Neurological: He is alert and oriented to person, place, and time.  Wet voice.  Able to follow basic commands without difficulty.  Couple beats clonus bilaterally with evidence of muscle wasting BLE.  Motor: Grossly 4+/5 througout Sensation diminished to light touch RLE  Skin: Skin is warm and dry.  Psychiatric: He has a normal mood and affect. His behavior is normal.    Results for orders placed or performed during the hospital encounter of 08/02/15 (from the past 24 hour(s))  Glucose, capillary     Status: Abnormal   Collection Time: 08/06/15 11:39 AM  Result Value Ref Range   Glucose-Capillary 119 (H) 65 - 99 mg/dL   Comment 1 Capillary Specimen   Glucose, capillary     Status: Abnormal   Collection Time: 08/06/15  5:44 PM  Result Value Ref Range   Glucose-Capillary 136 (H) 65 - 99 mg/dL   Comment 1 Capillary Specimen   Glucose, capillary     Status: Abnormal   Collection Time: 08/06/15  8:56 PM  Result Value Ref Range   Glucose-Capillary 118 (H) 65 - 99 mg/dL  Glucose, capillary     Status: Abnormal   Collection Time: 08/06/15 11:46 PM  Result Value Ref Range   Glucose-Capillary 117 (H) 65 - 99 mg/dL   Comment 1 Document in Chart   CBC     Status: Abnormal   Collection Time: 08/07/15  3:32 AM  Result Value Ref Range   WBC 6.6 4.0 -  10.5 K/uL   RBC 3.38 (L) 4.22 - 5.81 MIL/uL   Hemoglobin 9.0 (L) 13.0 - 17.0 g/dL   HCT 28.3 (L) 39.0 -  52.0 %   MCV 83.7 78.0 - 100.0 fL   MCH 26.6 26.0 - 34.0 pg   MCHC 31.8 30.0 - 36.0 g/dL   RDW 16.0 (H) 11.5 - 15.5 %   Platelets 483 (H) 150 - 400 K/uL  Glucose, capillary     Status: None   Collection Time: 08/07/15  4:16 AM  Result Value Ref Range   Glucose-Capillary 74 65 - 99 mg/dL   Comment 1 Document in Chart   Glucose, capillary     Status: None   Collection Time: 08/07/15  8:33 AM  Result Value Ref Range   Glucose-Capillary 72 65 - 99 mg/dL   Comment 1 Notify RN    Comment 2 Document in Chart     Assessment/Plan: Diagnosis: Debility Labs and images independently reviewed.  Records reviewed and summated above.  1. Does the need for close, 24 hr/day medical supervision in concert with the patient's rehab needs make it unreasonable for this patient to be served in a less intensive setting? Potentially 2. Co-Morbidities requiring supervision/potential complications: lung cancer (cont to monitor), COPD (Monitor RR and O2 sats with increased activity), recurrent aspiration PNA (monitor for recurrence),  bronchiectasis of RLL, chronic back pain with L5 radiculopathy (Biofeedback training with therapies to help reduce reliance on opiate pain medications, monitor pain control during therapies, and sedation at rest and titrate to maximum efficacy to ensure participation and gains in therapies), bipolar disorder with anxiety (cont meds), A fib with RVR (monitor HR with increased activity), chronic dysphagia (cont SLP, advance diet as tolerated), diplopia, right knee instability (cont AFO), systolic dysfunction (Monitor in accordance with increased physical activity and avoid UE resistance excercises), ?myopathy, tachypnea (monitor RR and O2 Sats with increased physical exertion), hyponatremia (cont to monitor, treat if necessary), hypokalemia (continue to monitor and replete as necessary), anemia (transfuse if necessary to ensure appropriate perfusion for increased activity tolerance)  3. Due  to safety, skin/wound care, medication administration, pain management and patient education, does the patient require 24 hr/day rehab nursing? Potentially 4. Does the patient require coordinated care of a physician, rehab nurse, PT (1-2 hrs/day, 5 days/week), OT (1-2 hrs/day, 5 days/week) and SLP (1-2 hrs/day, 5 days/week) to address physical and functional deficits in the context of the above medical diagnosis(es)? Potentially Addressing deficits in the following areas: balance, endurance, locomotion, strength, transferring, bathing, dressing, toileting, swallowing and psychosocial support 5. Can the patient actively participate in an intensive therapy program of at least 3 hrs of therapy per day at least 5 days per week? Potentially 6. The potential for patient to make measurable gains while on inpatient rehab is good 7. Anticipated functional outcomes upon discharge from inpatient rehab are supervision and min assist  with PT, supervision and min assist with OT, modified independent with SLP. 8. Estimated rehab length of stay to reach the above functional goals is: 16-19 days. 9. Does the patient have adequate social supports and living environment to accommodate these discharge functional goals? Potentially 10. Anticipated D/C setting: Home 11. Anticipated post D/C treatments: HH therapy and Home excercise program 12. Overall Rehab/Functional Prognosis: good and fair  RECOMMENDATIONS: This patient's condition is appropriate for continued rehabilitative care in the following setting: Likely CIR after completion of medical workup and assessment of baseline level of functioning.  Patient has agreed to participate in  recommended program. Yes Note that insurance prior authorization may be required for reimbursement for recommended care.  Comment: Rehab Admissions Coordinator to follow up.  Delice Lesch, MD 08/07/2015

## 2015-08-07 NOTE — Progress Notes (Signed)
I met with pt at bedside to discuss a possible inpt rehab admission pending bed availability when pt medically ready. Currently beds limited this week. I requested to contact pt's wife by phone, but he requests to discuss with her first. I left my contact information at bedside. I will follow up tomorrow. [phone]

## 2015-08-07 NOTE — Progress Notes (Signed)
Name: Malik Zuniga MRN: [id number] DOB: 1945-06-22    ADMISSION DATE:  08/02/2015 CONSULTATION DATE:  08/07/2015  REFERRING MD :  [name], EDP  CHIEF COMPLAINT:  Sent from the clinic for confusion, tachycardia   HISTORY OF PRESENT ILLNESS:  70 year old man with COPD, recurrent aspiration and bronchiectasis in the right lower lobe. He was formally followed by Dr. [name] and is now followed by Dr. [name] in the pulmonary clinic.  He was sent in for the pulmonary clinic today where he was noted to be confused and tachycardic. He reports 4 days of coughing up.brown mucus and hemoptysis of blood tinged sputum appearing pink or the last 24 hours multiple episodes. This history is confirmed by his wife. He reports feeling warm and sweats but no fever was recorded. He was prescribed Augmentin which she took he has taken this in the past when he needed prolonged antibiotics. He has tried to be compliant with a pured diet  In the ED was noted to have a lactate of 1.3 and a white count of 13.2 chest x-ray showed no clear infiltrates and a mild right lower lobe platelike atelectasis He was given cefepime and vancomycin and started on Cardizem for atrial fibrillation and RVR    He was treated with SB RT for right lower lobe adenocarcinoma in 2016 with decrease in the size of the nodule and a follow-up scan in 04/2015 He was admitted multiple times in 2017 for recurrent aspiration pneumonia and last scan from 04/2015 shows improved left lower lobe infiltrate.  His spirometry from 04/2009 shows FEV1 of 1.65-47% with a ratio 49 suggesting stage III COPD  SIGNIFICANT EVENTS  6/23 intubated 6/24 extubated  STUDIES:  04/2015 CT chest - Decrease in size of a right lower lobe adenocarcinoma with changes of radiation therapy in the adjacent right lung. Improving left lower lobe consolidation with a residual small left pleural effusion.   SUBJECTIVE:  upbeat Afebrile On [place] No further hemoptysis     VITAL SIGNS: Temp:  [97.6 F (36.4 C)-98.4 F (36.9 C)] 97.6 F (36.4 C) (06/27 1307) Pulse Rate:  [83-93] 89 (06/27 1307) Resp:  [20-25] 20 (06/27 1307) BP: (115-145)/(67-87) 136/67 mmHg (06/27 1307) SpO2:  [93 %-100 %] 93 % (06/27 1307) Weight:  [177 lb 1.6 oz (80.332 kg)-177 lb 3.2 oz (80.377 kg)] 177 lb 1.6 oz (80.332 kg) (06/27 0411)  PHYSICAL EXAMINATION: General: chr ill appearing,siting up in bed Neuro-  alert,interactive, non focal HEENT:  jvd  Cardiovascular:  S1 and S2 tachycardia and irregular, no murmur Lungs:  Decreased breath sounds bilateteral coarse Abdomen:  Soft and nontender Musculoskeletal:  Good pulses, no edema   Recent Labs Lab 08/04/15 0330 08/05/15 0325 08/06/15 0340  NA 135 137 134*  K 4.1 3.9 3.3*  CL 106 103 101  CO2 '22 25 27  '$ BUN '8 7 7  '$ CREATININE 0.67 0.58* 0.66  GLUCOSE 128* 142* 101*    Recent Labs Lab 08/05/15 0325 08/06/15 0340 08/07/15 0332  HGB 8.5* 8.6* 9.0*  HCT 26.8* 26.5* 28.3*  WBC 8.1 6.9 6.6  PLT 470* 465* 483*   No results found.  ASSESSMENT / PLAN:  Acute resp failure -  Extubated  Acute exacerbation of bronchiectasis versus another aspiration episode -can dc zosyn, off  vanc - sputum culture neg  -dys 2 diet per swallow eval   Hemoptysis, likely related to above -  resolved  COPD-no evidence of exacerbation,-He quit smoking in 2014 -Continue Pulmicort and DuoNeb nebs -dc  solumedrol    Acute encephalopathy - related to sepsis, resolved Chronic pain/ bipolar - resumed meds Long standing unexplained dysphagia with intermittent diplopia - myasthenia abs sent -MRI brain + C spine was planned as outpt - can obtain now  Atrial fibrillation/RVR - new onset,back in nSR  - recent nuclear stress test was a low-risk study with EF of 48%  - can anticoagulate now that hemoptysis subsided (high CHADS) - have d/w cards   PT/CIR  consult  PCCM available as needed  Kara Mead MD. FCCP. [place]  Pulmonary & Critical care Pager [phone] If no response call [phone]   08/07/2015

## 2015-08-07 NOTE — Progress Notes (Signed)
    Subjective:  Feeling well today. No CP or dyspnea at rest.   Objective:  Vital Signs in the last 24 hours: Temp:  [97.8 F (36.6 C)-98.4 F (36.9 C)] 97.8 F (36.6 C) (06/27 0411) Pulse Rate:  [42-98] 85 (06/27 0411) Resp:  [18-25] 22 (06/27 0411) BP: (115-145)/(74-89) 144/87 mmHg (06/27 0411) SpO2:  [95 %-100 %] 95 % (06/27 0411) Weight:  [177 lb 1.6 oz (80.332 kg)-177 lb 3.2 oz (80.377 kg)] 177 lb 1.6 oz (80.332 kg) (06/27 0411)  Intake/Output from previous day: 06/26 0701 - 06/27 0700 In: 538.8 [P.O.:240; I.V.:198.8; IV Piggyback:100] Out: 550 [Urine:550]  Physical Exam: Pt is alert and oriented, NAD HEENT: normal Neck: JVP - normal Lungs: CTA bilaterally CV: RRR without murmur or gallop Abd: soft, NT Ext: no C/C/E, distal pulses intact and equal Skin: warm/dry no rash   Lab Results:  Recent Labs  08/06/15 0340 08/07/15 0332  WBC 6.9 6.6  HGB 8.6* 9.0*  PLT 465* 483*    Recent Labs  08/05/15 0325 08/06/15 0340  NA 137 134*  K 3.9 3.3*  CL 103 101  CO2 25 27  GLUCOSE 142* 101*  BUN 7 7  CREATININE 0.58* 0.66   No results for input(s): TROPONINI in the last 72 hours.  Invalid input(s): CK, MB  Tele: Sinus rhythm, personally reviewed  Assessment/Plan:  Atrial flutter with RVR: maintaining sinus rhythm. Discussed anticoagulation with Dr Elsworth Soho and patient has actually had minimal hemoptysis associated with his acute respiratory illness. We agree risk/benefit is favorable considering his high CHADS-Vasc score. Will start Eliquis 5 mg BID today.  Sherren Mocha, M.D. 08/07/2015, 8:28 AM

## 2015-08-07 NOTE — Progress Notes (Signed)
Physical Therapy Treatment Patient Details Name: Malik Zuniga MRN: [id number] DOB: [birth year] Today's Date: 08/07/2015    History of Present Illness Pt is a 70 y/o M who presented to pulmonary clinic w/ confusion and tachycardic.  Pt admitted for acute exacerbation of bronchiectasis versus another aspiration episode.  Pt was intubated 6/23 and extubated 6/24.  Pt has been admitted multiple times in 2017 for recurrent aspiration pneumonia.  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    PT Comments    Patient progressing with ambulation distance this session and improved with supine to sit when practiced through sidelying.  Feel, though progressing, will needs CIR level rehab due to pt reports down with pneumonia since Oct and unable to gain any strength and continues to be a huge fall risk (reports falling forward, but now has all his weight behind him with significant fear component.)  Follow Up Recommendations  CIR;Supervision for mobility/OOB     Equipment Recommendations       Recommendations for Other Services       Precautions / Restrictions Precautions Precautions: Fall Required Braces or Orthoses: Other Brace/Splint Other Brace/Splint: pt uses Rt AFO (wife to bring for next session) Restrictions Weight Bearing Restrictions: No    Mobility  Bed Mobility Overal bed mobility: Needs Assistance Bed Mobility: Rolling;Sidelying to Sit Rolling: Min guard Sidelying to sit: Min guard       General bed mobility comments: cues for technique and use of rail with minguard for technique/safety  Transfers Overall transfer level: Needs assistance Equipment used: Standard walker Transfers: Sit to/from Stand Sit to Stand: Min guard         General transfer comment: steadying  assist  Ambulation/Gait Ambulation/Gait assistance: Min guard Ambulation Distance (Feet): 100 Feet Assistive device: Rolling walker (2 wheeled) Gait Pattern/deviations: Step-through pattern;Decreased stride length;Trunk flexed;Wide base of support     General Gait Details: cues for forward gaze and upright posture as much as possible; noted weight on heels and posterior bias throughout, SpO2 97% ambulating on 2L O2   Stairs            Wheelchair Mobility    Modified Rankin (Stroke Patients Only)       Balance Overall balance assessment: Needs assistance   Sitting balance-Leahy Scale: Fair     Standing balance support: Bilateral upper extremity supported Standing balance-Leahy Scale: Poor Standing balance comment: unsteady on walker with UE support                    Cognition Arousal/Alertness: Awake/alert Behavior During Therapy: WFL for tasks assessed/performed Overall Cognitive Status: Within Functional Limits for tasks assessed                      Exercises Other Exercises Other Exercises: sit <> stand with UE support from armchair with minguard A x 5 reps  Other Exercises: seated in chair for brushing teeth (attempted standi, but unable to standing without bilat UE support)    General Comments        Pertinent Vitals/Pain Faces Pain Scale: Hurts a little bit Pain Location: back with movement Pain Intervention(s): Monitored during session;Repositioned    Home Living                      Prior Function  PT Goals (current goals can now be found in the care plan section) Progress towards PT goals: Progressing toward goals    Frequency  Min 3X/week    PT Plan Current plan remains appropriate    Co-evaluation             End of Session Equipment Utilized During Treatment: Gait belt;Oxygen Activity Tolerance: Patient limited by fatigue Patient left: in chair;with call bell/phone within reach      Time: 1319-1355 PT Time Calculation (min) (ACUTE ONLY): 36 min  Charges:  $Gait Training: 8-22 mins $Therapeutic Activity: 8-22 mins                    G Codes:      Reginia Naas Aug 28, 2015, 2:27 PM  Magda Kiel, [place] 2015/08/28

## 2015-08-07 NOTE — Progress Notes (Signed)
ANTICOAGULATION CONSULT NOTE - Initial Consult  Pharmacy Consult for Pradaxa Indication: atrial fibrillation  Allergies  Allergen Reactions  . Lisinopril Swelling    ANGIOEDEMA  . Lamictal [Lamotrigine] Other (See Comments)    Weakness/difficulty swallowing  . Ambien [Zolpidem] Other (See Comments)    Unknown reaction    Patient Measurements: Height: '5\' 10"'$  (177.8 cm) Weight: 177 lb 1.6 oz (80.332 kg) IBW/kg (Calculated) : 73  Vital Signs: Temp: 97.8 F (36.6 C) (06/27 0411) Temp Source: Oral (06/27 0411) BP: 144/87 mmHg (06/27 0411) Pulse Rate: 85 (06/27 0411)  Labs:  Recent Labs  08/05/15 0325 08/05/15 1825 08/06/15 0340 08/07/15 0332  HGB 8.5*  --  8.6* 9.0*  HCT 26.8*  --  26.5* 28.3*  PLT 470*  --  465* 483*  HEPARINUNFRC  --  0.12* 0.28*  --   CREATININE 0.58*  --  0.66  --     Estimated Creatinine Clearance: 88.7 mL/min (by C-G formula based on Cr of 0.66).   Medical History: Past Medical History  Diagnosis Date  . Hyperlipidemia     takes Simvastatin daily  . Tremor   . History of prostate cancer 2004  . On home O2   . Neuromuscular disorder ([place]) 1998    right carpal tunnel release  . Anemia associated with acute blood loss   . GERD (gastroesophageal reflux disease)     takes Omeprazole daily  . Hypertension     takes Metoprolol daily as well as Hyzaar  . Constipation     takes Colace daily as well as Miralax  . Anxiety     takes Valium daily  . Depression     takes Cymbalta daily  . Emphysema of lung ([facility])     Albuterol as needed;Symbicort daily and Singulair at bedtime  . Myocardial infarction ([place]) 04/1998  . Coronary artery disease   . Asthma   . Shortness of breath     with exertion  . History of bronchitis   . Aspiration pneumonia ([place]) 2010  . Headache, chronic daily   . History of migraine     last migraine a couple of days ago;takes Excedrin Migraine  . Chronic back pain     compression fracture  . History of colon  polyps   . Mood change ([place])     after Brain surgery mood changed and was placed on Depakote  . Insomnia     takes Benadryl nightly  . Stroke [facility]) 1998    Brain Aneurysm  . Lung cancer ([place]) 2016    Assessment:  Anticoagulation:  New aflutter and CHADSVASC ~ 5 - recent hemoptysis.  6/23: pt reports 4 days of coughing up.brown mucus and hemoptysis of blood tinged sputum appearing pink  - Eliquis/Xarelto contraindicated with his primidone (strong CYP 3A4 inducer). Dr. Burt Knack ok with Pradaxa.  Goal of Therapy:  Therapeutic oral anticoagulation Monitor platelets by anticoagulation protocol: Yes   Plan:  - Pradaxa '150mg'$  BID per Dr. Vertis Kelch. Alford Highland, PharmD, BCPS Clinical Staff Pharmacist Pager [phone]  Eilene Ghazi [place] 08/07/2015,9:27 AM

## 2015-08-07 NOTE — Progress Notes (Signed)
Progress Note    LASH MATULICH  MOL:[id number] DOB: 08/08/1945  DOA: 08/02/2015 PCP: [name], MD    Brief Narrative:   Malik Zuniga is an 70 y.o. male with a PMH of prostate (remote), COPD, recurrent hospitalizations for treatment of aspiration pneumonia with recent diagnosis of stage IA lung cancer by needle biopsy 11/2014 treated with radiation therapy who was sent to the hospital by his pulmonologist on 08/02/15 for evaluation of disorientation with hallucinations, worsening weakness and found to have atrial flutter and concerns for sepsis/HCAP.  Admitted by pulmonology service. Care subsequently transferred to [facility] on 08/07/15.  Assessment/Plan:   Principal Problems:   Atrial flutter with rapid ventricular response ([place]) Documented heart rates as high as 160s on admission. Did not respond to maximum dose Cardizem. Subsequently treated with an amiodarone drip and digoxin load. Oral metoprolol subsequently added. Seen by cardiology. Therapeutic anticoagulation not initiated secondary to concerns of hemoptysis despite CHADS2vasc score of 5. 2-D echo done 08/06/15 and showed EF 50-55 percent with no regional wall motion abnormalities. Doppler parameters were normal. Therapeutic anticoagulation with heparin initiated 08/05/15 after hemoptysis resolved, now on Pradaxa. Spontaneously converted to sinus rhythm and rate controlling medications were changed from IV route to by mouth on 08/06/15.    Acute respiratory failure in the setting of COPD and acute on chronic diastolic CHF, class IV Intubated 08/03/15-08/04/15. Maintained on his usual doses of Pulmicort and provided with Duonebs. Solu-Medrol discontinued 08/06/15.Respiratory status stable.    Sepsis secondary to Recurrent aspiration pneumonia/bronchiectasis/HCAP/dysphasia Initially treated with cefepime/vancomycin. Sputum cultures grew normal respiratory flora and blood cultures are negative to date. Evaluated by speech therapist  08/04/15 with recommendations for nothing by mouth status. To further evaluate dysphasia, neurology was consulted. MRI of the brain and cervical spine were recommended, but can be done as outpatient. Muscle specific kinase antibodies were also checked and found to be negative. Vancomycin discontinued 08/05/15. Diet was advanced to dysphagia to thin liquids on 08/05/15. Continue Zosyn. Could transition to oral antibiotics with Cipro/Flagyl soon.  Active Problems:   Delirium in the setting of Bipolar disorder ([place]) Complicated by polypharmacy. Patient continues to want combination of medications including muscle relaxers and opiates for pain control. Would try to limit these as much as possible and wean him slowly over time. Continue Abilify and Depakote.    Essential hypertension Blood pressure currently reasonable.    Primary cancer of right lower lobe of lung (HCC) Stage IA, treated with radiation therapy. Follow-up CT showed a decrease in size of the lesion with changes of radiation therapy in the adjacent right lung.    Moderate malnutrition ([place]) Evaluated by dietitian. Initially placed on tube feeds per dietary protocol. Now on oral intake with supplements ordered per dietitian recommendations.    Hypophosphatemia/hypokalemia Supplemented.    Hyperlipidemia Continue Lipitor. Note: The patient will need to be switched from simvastatin to Lipitor at discharge due to an interaction with diltiazem.   Family Communication/Anticipated D/C date and plan/Code Status   DVT prophylaxis: Lovenox ordered. Code Status: Full Code.  Family Communication: Wife updated at bedside. Disposition Plan: CIR versus home health PT if not a candidate.   Medical Consultants:    Pulmonology  Cardiology  Neurology   Procedures:   6/23 intubated 6/24 extubated  Anti-Infectives:   Cefepime 08/02/15---> 08/02/15 Vancomycin 08/02/15--->/08/05/15 Zosyn 08/02/15---> Flagyl 08/03/15--->  08/03/15  Subjective:   Malik Zuniga continues to have problems with dysphasia and choked on a dysphagia 2 diet  and requested that his diet be changed to dysphagia 1. He continues to have a cough. Denies shortness of breath. Complains of chronic neck pain and back pain related to prior injuries, and requests hydrocodone. Of note, he also wants Valium and muscle relaxers. I advised him that this combination of medications is very dangerous and he should not use all 3 of these together.  Objective:    Filed Vitals:   08/06/15 1742 08/06/15 2042 08/06/15 2101 08/07/15 0411  BP: 115/74  145/83 144/87  Pulse: 88  93 85  Temp: 98.4 F (36.9 C)  98.4 F (36.9 C) 97.8 F (36.6 C)  TempSrc: Oral  Oral Oral  Resp: '25  20 22  '$ Height:      Weight:   80.377 kg (177 lb 3.2 oz) 80.332 kg (177 lb 1.6 oz)  SpO2: 100% 96% 97% 95%    Intake/Output Summary (Last 24 hours) at 08/07/15 0838 Last data filed at 08/06/15 1100  Gross per 24 hour  Intake 243.63 ml  Output    550 ml  Net -306.37 ml   Filed Weights   08/06/15 0318 08/06/15 2101 08/07/15 0411  Weight: 80.377 kg (177 lb 3.2 oz) 80.377 kg (177 lb 3.2 oz) 80.332 kg (177 lb 1.6 oz)    Exam: General exam: Appears calm and comfortable.  Respiratory system: Clear to auscultation. Respiratory effort normal. Cardiovascular system: S1 & S2 heard, RRR. No JVD,  rubs, gallops or clicks. No murmurs. Gastrointestinal system: Abdomen is nondistended, soft and nontender. No organomegaly or masses felt. Normal bowel sounds heard. Central nervous system: Sleepy but oriented 2. No focal neurological deficits. Extremities: No clubbing, edema, or cyanosis. Skin: No rashes, lesions or ulcers Psychiatry: Judgement and insight appear diminished. Mood & affect flat.   Data Reviewed:   I have personally reviewed following labs and imaging studies:  Labs: Basic Metabolic Panel:  Recent Labs Lab 08/02/15 1744 08/02/15 2353 08/03/15 0215  08/03/15 1856 08/03/15 2045 08/04/15 0330 08/04/15 0750 08/04/15 1658 08/05/15 0325 08/06/15 0340  NA 135  --  137  --   --  135  --   --  137 134*  K 4.2  --  4.0  --   --  4.1  --   --  3.9 3.3*  CL 101  --  106  --   --  106  --   --  103 101  CO2 23  --  22  --   --  22  --   --  25 27  GLUCOSE 104*  --  104*  --   --  128*  --   --  142* 101*  BUN 24*  --  12  --   --  8  --   --  7 7  CREATININE 0.91  --  0.73  --   --  0.67  --   --  0.58* 0.66  CALCIUM 9.3  --  8.2*  --   --  7.9*  --   --  8.4* 8.4*  MG  --  1.6*  --  1.7 1.7  --  1.8 1.8  --   --   PHOS  --   --   --  2.8 2.6  --  2.1* 1.9*  --   --    GFR Estimated Creatinine Clearance: 88.7 mL/min (by C-G formula based on Cr of 0.66). Liver Function Tests:  Recent Labs Lab 08/02/15 1744  AST 18  ALT  13*  ALKPHOS 53  BILITOT 0.6  PROT 8.5*  ALBUMIN 3.4*   No results for input(s): LIPASE, AMYLASE in the last 168 hours. No results for input(s): AMMONIA in the last 168 hours. Coagulation profile  Recent Labs Lab 08/02/15 2353  INR 1.29    CBC:  Recent Labs Lab 08/02/15 1744 08/03/15 0215 08/04/15 0330 08/05/15 0325 08/06/15 0340 08/07/15 0332  WBC 13.2* 10.4 8.1 8.1 6.9 6.6  NEUTROABS 10.9*  --  7.3  --   --   --   HGB 11.4* 9.6* 8.1* 8.5* 8.6* 9.0*  HCT 33.8* 29.9* 25.3* 26.8* 26.5* 28.3*  MCV 83.9 84.2 84.6 84.0 83.1 83.7  PLT 629* 514* 421* 470* 465* 483*   Cardiac Enzymes: No results for input(s): CKTOTAL, CKMB, CKMBINDEX, TROPONINI in the last 168 hours. BNP (last 3 results) No results for input(s): PROBNP in the last 8760 hours. CBG:  Recent Labs Lab 08/06/15 1139 08/06/15 1744 08/06/15 2056 08/06/15 2346 08/07/15 0416  GLUCAP 119* 136* 118* 117* 74    Sepsis Labs:  Recent Labs Lab 08/02/15 1755 08/02/15 2155 08/02/15 2353 08/03/15 0215 08/04/15 0330 08/05/15 0325 08/06/15 0340 08/07/15 0332  PROCALCITON  --   --  <0.10  --   --   --   --   --   WBC  --   --   --   10.4 8.1 8.1 6.9 6.6  LATICACIDVEN 1.32 1.38 1.2 1.4  --   --   --   --    Microbiology Recent Results (from the past 240 hour(s))  Blood Culture (routine x 2)     Status: None (Preliminary result)   Collection Time: 08/02/15  5:44 PM  Result Value Ref Range Status   Specimen Description BLOOD RIGHT ANTECUBITAL  Final   Special Requests BOTTLES DRAWN AEROBIC AND ANAEROBIC 5CC EACH  Final   Culture   Final    NO GROWTH 4 DAYS Performed at [facility]    Report Status PENDING  Incomplete  Blood Culture (routine x 2)     Status: None (Preliminary result)   Collection Time: 08/02/15  5:45 PM  Result Value Ref Range Status   Specimen Description BLOOD BLOOD LEFT FOREARM  Final   Special Requests BOTTLES DRAWN AEROBIC AND ANAEROBIC 5CC EACH  Final   Culture   Final    NO GROWTH 4 DAYS Performed at [facility]    Report Status PENDING  Incomplete  Urine culture     Status: None   Collection Time: 08/02/15  8:07 PM  Result Value Ref Range Status   Specimen Description URINE, CLEAN CATCH  Final   Special Requests NONE  Final   Culture NO GROWTH Performed at [facility]   Final   Report Status 08/04/2015 FINAL  Final  MRSA PCR Screening     Status: None   Collection Time: 08/02/15 11:32 PM  Result Value Ref Range Status   MRSA by PCR NEGATIVE NEGATIVE Final    Comment:        The GeneXpert MRSA Assay (FDA approved for NASAL specimens only), is one component of a comprehensive MRSA colonization surveillance program. It is not intended to diagnose MRSA infection nor to guide or monitor treatment for MRSA infections.   Culture, respiratory (NON-Expectorated)     Status: None   Collection Time: 08/04/15  9:03 AM  Result Value Ref Range Status   Specimen Description TRACHEAL ASPIRATE  Final   Special Requests NONE  Final   Gram Stain   Final    ABUNDANT WBC PRESENT, PREDOMINANTLY PMN FEW SQUAMOUS EPITHELIAL CELLS PRESENT FEW GRAM POSITIVE COCCI IN  PAIRS RARE BUDDING YEAST SEEN    Culture Consistent with normal respiratory flora.  Final   Report Status 08/06/2015 FINAL  Final    Radiology: Dg Swallowing Func-speech Pathology  08/05/2015  Objective Swallowing Evaluation: Type of Study: MBS-Modified Barium Swallow Study Patient Details Name: EMMITTE SURGEON MRN: [id number] Date of Birth: 1945/02/15 Today's Date: 08/05/2015 Time: SLP Start Time (ACUTE ONLY): 1135-SLP Stop Time (ACUTE ONLY): 1200 SLP Time Calculation (min) (ACUTE ONLY): 25 min Past Medical History: Past Medical History Diagnosis Date . Hyperlipidemia    takes Simvastatin daily . Tremor  . History of prostate cancer 2004 . On home O2  . Neuromuscular disorder ([place]) 1998   right carpal tunnel release . Anemia associated with acute blood loss  . GERD (gastroesophageal reflux disease)    takes Omeprazole daily . Hypertension    takes Metoprolol daily as well as Hyzaar . Constipation    takes Colace daily as well as Miralax . Anxiety    takes Valium daily . Depression    takes Cymbalta daily . Emphysema of lung (HCC)    Albuterol as needed;Symbicort daily and Singulair at bedtime . Myocardial infarction ([place]) 04/1998 . Coronary artery disease  . Asthma  . Shortness of breath    with exertion . History of bronchitis  . Aspiration pneumonia ([place]) 2010 . Headache, chronic daily  . History of migraine    last migraine a couple of days ago;takes Excedrin Migraine . Chronic back pain    compression fracture . History of colon polyps  . Mood change ([place])    after Brain surgery mood changed and was placed on Depakote . Insomnia    takes Benadryl nightly . 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 Lung cancer ([place]) 2016 Past Surgical History: Past Surgical History Procedure Laterality Date . Cholecystectomy  1996 . Craniotomy  1999   to clip aneurism, Dr. Annette Stable . Vascular stent  2000   Dr. Rollene Fare; he reports cardiac stent, but denies stent for PAD 06/15/13 . Hand surgery  1989   crushed thumb, Dr. Fredna Dow . Ulnar  nerve repair  1998   left arm, Dr. Fredna Dow . Gastrostomy w/ feeding tube  2008   Dr. Watt Climes; only had for a few months . Prostatectomy  04/2001   removal of prostate cancer, Dr. Janice Norrie . Coronary angioplasty with stent placement  04/1998 . Carpal tunnel release  1998   right . Spine surgery   . Brain surgery  1999   clip aneurysm . Back surgery  08/2004; 02/2005; 04/2006; 06/2007; 7/20101   all by Dr. Annette Stable . Esophagogastroduodenoscopy N/A 07/23/2012   Procedure: ESOPHAGOGASTRODUODENOSCOPY (EGD);  Surgeon: Jeryl Columbia, MD;  Location: [facility] ENDOSCOPY;  Service: Endoscopy;  Laterality: N/A;  buccini /ja . Colonoscopy   . Back surgery  05/2013 . Dg biopsy lung   HPI: SHAQUIL ALDANA is a 70 y.o. male with medical history significant of prostate (remote) and recent lung cancer; recent hospitalization and recurrent hospitalizations for aspiration PNA. He was hospitalized 1 month ago for a week for aspiration pneumonia. Was seen today for routine hospital f/u with pulm today and sent to [facility] ER. Wife reports that he started getting sick last Monday night and has gotten progressively worse. Started with generalized weakness, generalized pain, decreased PO intake, and cough. +hemoptysis, started last night (no  history of prior). Has had persistent PNA since October, per wife report. Has taken Ensure but minimal food over the last few days. She has worked hard on giving him only a soft mechanical diet since last hospitalization.  Cough was productive of brown sputum yesterday. +SOB, more than usual. +nasal congestion. No CP/palpitations.  Very, very weak - much more difficulty with mobility. Increased back pain since last fall.  Pt is known to ST service from previous MBS.  Most recent was conducted on 07/05/2015 with recommendation for a dysphagia 1 diet with nectar thick liquids.  The patient was seen once for oupt follow up. Most recent chest x-ray is showing streaky left basilar atelectasis vs infiltrate.  Subjective: The  patient was seen sitting upright in bed with his wife and son present.  Assessment / Plan / Recommendation CHL IP CLINICAL IMPRESSIONS 08/05/2015 Therapy Diagnosis Moderate oral phase dysphagia;Mild pharyngeal phase dysphagia Clinical Impression Pt currently presenting with a mild-moderate oropharyngeal dysphagia.  Dysphagia characteristics similar to recent study on 07/05/15; however, today silent aspiration was observed x1 with thin liquids by straw- pt continues to have poor bolus containment resulting in premature spill to the level of the pyriform sinuses prior to swallow initiation. No penetration or aspiration observed with multiple trials of thin liquid by cup with pt cued to "take a small sip". Across consistencies pt with mild-moderate residuals at the level of vallecula and pyriform sinuses which appears to be due to pt's head/ neck position being slightly downward, along with decreased anterior hyolaryngeal movement; most of residuals were cleared when pt cued to swallow again. Was difficult to visualize UES for most of study due to positioning; however, was able to see following a puree bolus with which there was the appearance of a cricopharyngeal bar with minimal residuals. Recommend initiating dysphagia 2 diet with thin liquids by cup- no straws, meds whole in puree, full supervision to cue pt to take small bites/ sips, multiple swallows, and intermittent throat clear. Ensure pt is sitting upright during meals looking straight ahead and not leaning forward; remain upright 30-60 minutes after meal. Will continue to follow closely for diet tolerance.  Impact on safety and function Moderate aspiration risk   CHL IP TREATMENT RECOMMENDATION 08/05/2015 Treatment Recommendations Therapy as outlined in treatment plan below   Prognosis 08/05/2015 Prognosis for Safe Diet Advancement Fair Barriers to Reach Goals -- Barriers/Prognosis Comment -- CHL IP DIET RECOMMENDATION 08/05/2015 SLP Diet Recommendations  Dysphagia 2 (Fine chop) solids;Thin liquid Liquid Administration via Cup;No straw Medication Administration Whole meds with puree Compensations Slow rate;Small sips/bites;Multiple dry swallows after each bite/sip;Clear throat intermittently Postural Changes Seated upright at 90 degrees   CHL IP OTHER RECOMMENDATIONS 08/05/2015 Recommended Consults -- Oral Care Recommendations Oral care BID Other Recommendations --   CHL IP FOLLOW UP RECOMMENDATIONS 08/05/2015 Follow up Recommendations Outpatient SLP   CHL IP FREQUENCY AND DURATION 08/05/2015 Speech Therapy Frequency (ACUTE ONLY) min 2x/week Treatment Duration 2 weeks      CHL IP ORAL PHASE 08/05/2015 Oral Phase Impaired Oral - Pudding Teaspoon -- Oral - Pudding Cup -- Oral - Honey Teaspoon -- Oral - Honey Cup -- Oral - Nectar Teaspoon -- Oral - Nectar Cup -- Oral - Nectar Straw -- Oral - Thin Teaspoon -- Oral - Thin Cup Premature spillage;Decreased bolus cohesion Oral - Thin Straw Decreased bolus cohesion;Premature spillage Oral - Puree Decreased bolus cohesion;Premature spillage Oral - Mech Soft Decreased bolus cohesion;Premature spillage Oral - Regular -- Oral - Multi-Consistency -- Oral -  Pill -- Oral Phase - Comment --  CHL IP PHARYNGEAL PHASE 08/05/2015 Pharyngeal Phase Impaired Pharyngeal- Pudding Teaspoon -- Pharyngeal -- Pharyngeal- Pudding Cup -- Pharyngeal -- Pharyngeal- Honey Teaspoon -- Pharyngeal -- Pharyngeal- Honey Cup -- Pharyngeal -- Pharyngeal- Nectar Teaspoon -- Pharyngeal -- Pharyngeal- Nectar Cup -- Pharyngeal -- Pharyngeal- Nectar Straw -- Pharyngeal -- Pharyngeal- Thin Teaspoon -- Pharyngeal -- Pharyngeal- Thin Cup Delayed swallow initiation-pyriform sinuses;Reduced anterior laryngeal mobility;Pharyngeal residue - valleculae;Pharyngeal residue - pyriform Pharyngeal -- Pharyngeal- Thin Straw Delayed swallow initiation-pyriform sinuses;Reduced anterior laryngeal mobility;Penetration/Aspiration before swallow;Pharyngeal residue - valleculae;Pharyngeal  residue - pyriform Pharyngeal Material enters airway, passes BELOW cords without attempt by patient to eject out (silent aspiration) Pharyngeal- Puree Delayed swallow initiation-vallecula;Reduced anterior laryngeal mobility;Pharyngeal residue - pyriform;Pharyngeal residue - valleculae Pharyngeal -- Pharyngeal- Mechanical Soft Delayed swallow initiation-vallecula;Reduced anterior laryngeal mobility;Pharyngeal residue - valleculae;Pharyngeal residue - pyriform Pharyngeal -- Pharyngeal- Regular -- Pharyngeal -- Pharyngeal- Multi-consistency -- Pharyngeal -- Pharyngeal- Pill -- Pharyngeal -- Pharyngeal Comment --  CHL IP CERVICAL ESOPHAGEAL PHASE 08/05/2015 Cervical Esophageal Phase Impaired Pudding Teaspoon -- Pudding Cup -- Honey Teaspoon -- Honey Cup -- Nectar Teaspoon -- Nectar Cup -- Nectar Straw -- Thin Teaspoon -- Thin Cup -- Thin Straw -- Puree -- Mechanical Soft -- Regular -- Multi-consistency -- Pill -- Cervical Esophageal Comment -- CHL IP GO 08/04/2015 Functional Assessment Tool Used ASHA NOMS and clinical judgment.   Functional Limitations Swallowing Swallow Current Status [phone]) CM Swallow Goal Status (X5170) CK Swallow Discharge Status (Y1749) (None) Motor Speech Current Status [phone]) (None) Motor Speech Goal Status [phone]) (None) Motor Speech Goal Status [phone]) (None) Spoken Language Comprehension Current Status [phone]) (None) Spoken Language Comprehension Goal Status (S1779) (None) Spoken Language Comprehension Discharge Status [phone]) (None) Spoken Language Expression Current Status [phone]) (None) Spoken Language Expression Goal Status [phone]) (None) Spoken Language Expression Discharge Status [phone]) (None) Attention Current Status (L4562) (None) Attention Goal Status (B6389) (None) Attention Discharge Status [phone]) (None) Memory Current Status (A7681) (None) Memory Goal Status (L5726) (None) Memory Discharge Status (O0355) (None) Voice Current Status (H7416) (None) Voice Goal Status (L8453) (None)  Voice Discharge Status [phone]) (None) Other Speech-Language Pathology Functional Limitation [phone]) (None) Other Speech-Language Pathology Functional Limitation Goal Status (Q8250) (None) Other Speech-Language Pathology Functional Limitation Discharge Status [phone]) (None) Kern Reap, MA, CCC-SLP 08/05/2015, 1:12 PM [phone]              Medications:   . antiseptic oral rinse  7 mL Mouth Rinse q12n4p  . ARIPiprazole  5 mg Oral Daily  . atorvastatin  20 mg Oral q1800  . budesonide  0.5 mg Nebulization BID  . chlorhexidine  15 mL Mouth Rinse BID  . diltiazem  240 mg Oral Daily  . divalproex  1,000 mg Oral QHS  . docusate sodium  200 mg Oral QHS  . DULoxetine  60 mg Oral BID  . feeding supplement (ENSURE ENLIVE)  237 mL Oral BID BM  . insulin aspart  0-9 Units Subcutaneous Q4H  . ipratropium  0.5 mg Nebulization Q6H  . levalbuterol  0.63 mg Nebulization Q6H  . metoprolol  5 mg Intravenous Once  . metoprolol tartrate  50 mg Oral BID  . pantoprazole  40 mg Oral Daily  . piperacillin-tazobactam (ZOSYN)  IV  3.375 g Intravenous Q8H  . primidone  250 mg Oral BID  . sodium chloride flush  10-40 mL Intracatheter Q12H  . sodium chloride flush  10-40 mL Intracatheter Q12H   Continuous Infusions: . sodium chloride  Time spent: 35 minutes.  The patient is medically complex with multiple co-morbidities and is at high risk for clinical deterioration and requires high complexity decision making.    LOS: 5 days   [facility] Pager [phone]. If unable to reach me by pager, please call my cell phone at [phone].  *Please refer to [email or web address], password [email or web address] to get updated schedule on who will round on this patient, as hospitalists switch teams weekly. If 7PM-7AM, please contact night-coverage at [email or web address], password [email or web address] for any overnight needs.  08/07/2015, 8:38 AM

## 2015-08-07 NOTE — Progress Notes (Signed)
Patient refusing CPT Flutter for tonight, states he has a Flutter at home and his wife will be bringing his in the morning. He states that he will begin in the morning with his home Flutter.

## 2015-08-07 NOTE — Consult Note (Signed)
   [facility] Inpatient Consult   08/07/2015  EMAURI KRYGIER 30-Apr-1945 [id number]  Patient is currently active with [facility] for chronic disease management services. Patient is a 70 y.o. male with history of lung cancer, COPD with recurrent aspiration PNA with recurrent admissions, bronchiectasis of RLL, chronic back pain with L5 radiculopathy treated with narcotics, bipolar disorder with anxiety, who was admitted on 08/02/15 from MD office with confusion, hallucinations, generalized pain with weakness, poor po intake and cough. He was admitted from MD office on 08/02/15 and was found to be septic with elevated lactate and was found to have A fib with RVR. He was started on IV antibiotics and IV Cardizem. Cardiology consulted for input--patient not candidate for anticoagulation due to hemoptysis. Recent cardiac stress test without evidence of ischemia. He was started on IV amiodarone as well as digoxin for rate control.  Patient is being considered for inpatient rehab.   Patient has been engaged by a [employer] and CSW.  Our community based plan of care has focused on disease management and community resource support.  Patient will receive a post discharge calls and will be evaluated for monthly home visits for assessments and disease process education.  Made Inpatient Case Manager aware that [facility] following. Of note, [facility] Care Management services does not replace or interfere with any services that are needed or arranged by inpatient case management or social work.  Will follow for disposition and needs.  For additional questions or referrals please contact:  Natividad Brood, RN BSN [facility] Liaison  [phone] business mobile phone Toll free office [phone]

## 2015-08-08 LAB — CBC
HEMATOCRIT: 29.8 % — AB (ref 39.0–52.0)
HEMOGLOBIN: 9.7 g/dL — AB (ref 13.0–17.0)
MCH: 27.3 pg (ref 26.0–34.0)
MCHC: 32.6 g/dL (ref 30.0–36.0)
MCV: 83.9 fL (ref 78.0–100.0)
Platelets: 483 10*3/uL — ABNORMAL HIGH (ref 150–400)
RBC: 3.55 MIL/uL — ABNORMAL LOW (ref 4.22–5.81)
RDW: 15.9 % — ABNORMAL HIGH (ref 11.5–15.5)
WBC: 7.4 10*3/uL (ref 4.0–10.5)

## 2015-08-08 LAB — BASIC METABOLIC PANEL
ANION GAP: 12 (ref 5–15)
BUN: 11 mg/dL (ref 6–20)
CALCIUM: 8.6 mg/dL — AB (ref 8.9–10.3)
CHLORIDE: 93 mmol/L — AB (ref 101–111)
CO2: 28 mmol/L (ref 22–32)
Creatinine, Ser: 0.74 mg/dL (ref 0.61–1.24)
GFR calc Af Amer: >60 mL/min (ref >60)
GFR calc non Af Amer: >60 mL/min (ref >60)
GLUCOSE: 96 mg/dL (ref 65–99)
POTASSIUM: 4 mmol/L (ref 3.5–5.1)
Sodium: 133 mmol/L — ABNORMAL LOW (ref 135–145)

## 2015-08-08 LAB — GLUCOSE, CAPILLARY
GLUCOSE-CAPILLARY: 118 mg/dL — AB (ref 65–99)
GLUCOSE-CAPILLARY: 107 mg/dL — AB (ref 65–99)
Glucose-Capillary: 79 mg/dL (ref 65–99)
Glucose-Capillary: 88 mg/dL (ref 65–99)
Glucose-Capillary: 114 mg/dL — ABNORMAL HIGH (ref 65–99)

## 2015-08-08 MED ORDER — IPRATROPIUM BROMIDE 0.02 % IN SOLN
0.5000 mg | Freq: Three times a day (TID) | RESPIRATORY_TRACT | Status: DC
  Administered 2015-08-09 (×2): 0.5 mg via RESPIRATORY_TRACT
  Filled 2015-08-08 (×2): qty 2.5

## 2015-08-08 MED ORDER — LEVALBUTEROL HCL 0.63 MG/3ML IN NEBU
0.6300 mg | INHALATION_SOLUTION | Freq: Three times a day (TID) | RESPIRATORY_TRACT | Status: DC
  Administered 2015-08-09 (×2): 0.63 mg via RESPIRATORY_TRACT
  Filled 2015-08-08 (×2): qty 3

## 2015-08-08 NOTE — Progress Notes (Signed)
RT Note: Pt has refused to receive a flutter valve from [place]. He wants to use the one he has at home. We are awaiting his wife to come in with it today so that he can begin using it here. Rt will continue to monitor.

## 2015-08-08 NOTE — Clinical Social Work Note (Signed)
Clinical Social Work Assessment  Patient Details  Name: Malik Zuniga MRN: [id number] Date of Birth: 03-Mar-1945  Date of referral:  08/08/15               Reason for consult:  Discharge Planning                Permission sought to share information with:  Family Supports, Customer service manager Permission granted to share information::  Yes, Verbal Permission Granted  Name::     Geophysical data processor::  SNFs  Relationship::  Wife  Contact Information:     Housing/Transportation Living arrangements for the past 2 months:  Single Family Home Source of Information:  Patient, Spouse Patient Interpreter Needed:  None Criminal Activity/Legal Involvement Pertinent to Current Situation/Hospitalization:  No - Comment as needed Significant Relationships:  Spouse Lives with:  Spouse Do you feel safe going back to the place where you live?  Yes Need for family participation in patient care:  No (Coment)  Care giving concerns:  The patient is aggregable for short term rehab at discharge. Patient would like to rebuild his strength to return home.    Social Worker assessment / plan: CSW met with patient at beside to complete assessment. Patient was resting comfortably in bed. CSW explained PT recommendation for CIR placement. CSW explained that an alternate plan of SNFs placement may be needed. Patient reported his support as his wife. Per patient request CSW called patient's wife. CSW explained PT recommendation for CIR placement. CSW explained that an alternate plan of SNFs placement may be needed. CSW explained SNF search and placement process to the patient's wife and answered her questions.  CSW will follow up with bed offers.  Employment status:  Retired Forensic scientist:  Medicare PT Recommendations:  Inpatient [place] / Referral to community resources:     Patient/Family's Response to care:  The patient appears happy with the care she is receiving in  hospital and is appreciative of CSW assistance.  Patient/Family's Understanding of and Emotional Response to Diagnosis, Current Treatment, and Prognosis: The patient's wife has a good understanding of why the patient  was admitted. She understands the care plan and what the patient  will need post discharge. Emotional Assessment Appearance:  Appears stated age Attitude/Demeanor/Rapport:   (Patient was appropriate.) Affect (typically observed):  Calm, Accepting, Appropriate Orientation:  Oriented to Self, Oriented to Place Alcohol / Substance use:  Not Applicable Psych involvement (Current and /or in the community):  No (Comment)  Discharge Needs  Concerns to be addressed:  Discharge Planning Concerns Readmission within the last 30 days:  No Current discharge risk:  Physical Impairment Barriers to Discharge:  Continued Medical Work up   [employer], LCSW 08/08/2015, 4:10 PM

## 2015-08-08 NOTE — NC FL2 (Signed)
[place] LEVEL OF CARE SCREENING TOOL     IDENTIFICATION  Patient Name: Malik Zuniga Birthdate: [birth year] Sex: male Admission Date (Current Location): 08/02/2015  [facility] and [place] Number:  Herbalist and Address:  [facility], [place] [address], [place], [place] [address]      Provider Number: [id number]  Attending Physician Name and Address:  Modena Jansky, MD  Relative Name and Phone Number:       Current Level of Care: Hospital Recommended Level of Care: Nursing Facility Prior Approval Number:    Date Approved/Denied:   PASRR Number: [id number]  Discharge Plan: SNF    Current Diagnoses: Patient Active Problem List   Diagnosis Date Noted  . Moderate malnutrition ([place]) 08/07/2015  . Bronchiectasis ([place]) 08/07/2015  . Cerebrovascular disease, arteriosclerotic, post-stroke 08/07/2015  . Acute delirium 08/07/2015  . Chronic bilateral low back pain with bilateral sciatica   . Adjustment disorder with mixed anxiety and depressed mood   . Paroxysmal atrial fibrillation (HCC)   . Diplopia   . Tachypnea   . Hypokalemia   . Absolute anemia   . Acute respiratory failure ([place])   . Acute on chronic diastolic CHF (congestive heart failure), NYHA class 4 ([place])   . COPD (chronic obstructive pulmonary disease) ([place])   . Bronchiectasis without acute exacerbation ([place]) 08/02/2015  . Atrial flutter with rapid ventricular response ([place]) 08/02/2015  . Aspiration pneumonia ([place]) 06/25/2015  . Tinea cruris 06/05/2015  . Sepsis ([place]) 04/01/2015  . Cancer of lower lobe of right lung ([place]) 04/01/2015  . Chronic respiratory failure with hypoxia ([place]) 04/01/2015  . Cough 03/08/2015  . Dysphagia 02/01/2015  . Primary cancer of right lower lobe of lung ([place]) 12/05/2014  . Recurrent aspiration pneumonia ([place]) 11/16/2013  . Toenail fungus 11/06/2013  . HCAP (healthcare-associated pneumonia) 10/23/2013  . Hypotension 10/16/2013  .  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 CAD (coronary artery disease) 07/27/2013  . Thoracic compression fracture ([place]) 06/21/2013  . Thoracic spine fracture ([place]) 06/21/2013  . Giant comedone 11/04/2012  . Unspecified constipation 07/24/2012  . Hyponatremia 07/22/2012  . Chronic lower back pain 07/22/2012  . Transaminitis 07/22/2012  . Prostate cancer ([place]) 04/22/2012  . Anemia associated with acute blood loss   . 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 Falls frequently 07/31/2011  . COPD  GOLD III with reversibility  01/14/2011  . Allergic rhinitis, seasonal 12/18/2010  . Neoplasm of uncertain behavior of skin 05/07/2010  . MYOCARDIAL INFARCTION 02/20/2010  . Hyperlipidemia 02/13/2010  . Bipolar disorder ([place]) 02/13/2010  . DEPRESSION 02/13/2010  . Essential hypertension 02/13/2010  . TREMOR 02/13/2010  . PROSTATE CANCER, HX OF 02/13/2010  . Hx of TIA (transient ischemic attack) and stroke 02/13/2010  . Chronic daily headache 02/13/2010    Orientation RESPIRATION BLADDER Height & Weight     Self, Time, Situation, Place  O2 (3L/min) External catheter Weight: 177 lb 1.6 oz (80.332 kg) Height:  '5\' 10"'$  (177.8 cm)  BEHAVIORAL SYMPTOMS/MOOD NEUROLOGICAL BOWEL NUTRITION STATUS   (None)  (None) Continent Diet (Dys 1)  AMBULATORY STATUS COMMUNICATION OF NEEDS Skin   Extensive Assist Verbally Normal                       Personal Care Assistance Level of Assistance  Bathing, Feeding, Dressing Bathing Assistance: Limited assistance Feeding assistance: Independent Dressing Assistance: Limited assistance     Functional Limitations Info  Sight, Hearing, Speech Sight Info: Impaired Hearing Info:  Impaired Speech Info: Adequate    SPECIAL CARE FACTORS FREQUENCY  PT (By licensed PT), OT (By licensed OT)     PT Frequency: 5/ week OT Frequency: 5/ week            Contractures Contractures Info: Not present    Additional Factors Info  Psychotropic, Insulin Sliding Scale, Code  Status, Allergies Code Status Info: Full Allergies Info: Lisinopril, Lamictal, Ambien Psychotropic Info: ARIPiprazole (ABILIFY) tablet 5 mg Dose: 5 mg Freq: Daily Route: PO,  divalproex (DEPAKOTE ER) 24 hr tablet 1,000 mg Dose: 1,000 mg Freq: Daily at bedtime Route: PO Insulin Sliding Scale Info: insulin aspart (novoLOG) injection 0-9 UnitsDose: 0-9 Units Freq: Every 4 hours Route: [place]       Current Medications (08/08/2015):  This is the current hospital active medication list Current Facility-Administered Medications  Medication Dose Route Frequency Provider Last Rate Last Dose  . 0.9 %  sodium chloride infusion   Intravenous Continuous Rigoberto Noel, MD      . acetaminophen (TYLENOL) tablet 650 mg  650 mg Oral Q4H PRN Karmen Bongo, MD   650 mg at 08/08/15 0558  . antiseptic oral rinse (CPC / CETYLPYRIDINIUM CHLORIDE 0.05%) solution 7 mL  7 mL Mouth Rinse q12n4p Raylene Miyamoto, MD   7 mL at 08/07/15 1707  . ARIPiprazole (ABILIFY) tablet 5 mg  5 mg Oral Daily Rigoberto Noel, MD   5 mg at 08/08/15 0941  . atorvastatin (LIPITOR) tablet 20 mg  20 mg Oral q1800 Kris Mouton, RPH   20 mg at 08/07/15 1706  . budesonide (PULMICORT) nebulizer solution 0.5 mg  0.5 mg Nebulization BID Karmen Bongo, MD   0.5 mg at 08/08/15 0738  . chlorhexidine (PERIDEX) 0.12 % solution 15 mL  15 mL Mouth Rinse BID Raylene Miyamoto, MD   15 mL at 08/08/15 0942  . dabigatran (PRADAXA) capsule 150 mg  150 mg Oral Q12H Crystal Trellis Moment, RPH   150 mg at 08/08/15 0945  . diazepam (VALIUM) tablet 5 mg  5 mg Oral Q6H PRN Rigoberto Noel, MD   5 mg at 08/07/15 2231  . diltiazem (CARDIZEM CD) 24 hr capsule 240 mg  240 mg Oral Daily Sherren Mocha, MD   240 mg at 08/08/15 0944  . divalproex (DEPAKOTE ER) 24 hr tablet 1,000 mg  1,000 mg Oral QHS Raylene Miyamoto, MD   1,000 mg at 08/07/15 2103  . docusate sodium (COLACE) capsule 200 mg  200 mg Oral QHS Jake Church Masters, RPH   200 mg at 08/07/15 2103  . DULoxetine  (CYMBALTA) DR capsule 60 mg  60 mg Oral BID Rigoberto Noel, MD   60 mg at 08/08/15 0943  . feeding supplement (ENSURE ENLIVE) (ENSURE ENLIVE) liquid 237 mL  237 mL Oral BID BM Raylene Miyamoto, MD   237 mL at 08/08/15 0945  . guaiFENesin (ROBITUSSIN) 100 MG/5ML solution 100 mg  5 mL Oral Q4H PRN Raylene Miyamoto, MD   100 mg at 08/07/15 2232  . HYDROcodone-acetaminophen (NORCO) 10-325 MG per tablet 1 tablet  1 tablet Oral Q6H PRN Venetia Maxon Rama, MD   1 tablet at 08/07/15 2231  . insulin aspart (novoLOG) injection 0-9 Units  0-9 Units Subcutaneous Q4H Raylene Miyamoto, MD   1 Units at 08/06/15 1816  . ipratropium (ATROVENT) nebulizer solution 0.5 mg  0.5 mg Nebulization Q6H Rigoberto Noel, MD   0.5 mg at 08/08/15 0730  . levalbuterol (  XOPENEX) nebulizer solution 0.63 mg  0.63 mg Nebulization Q6H Rigoberto Noel, MD   0.63 mg at 08/08/15 0730  . levalbuterol (XOPENEX) nebulizer solution 0.63 mg  0.63 mg Nebulization Q4H PRN Rigoberto Noel, MD      . metoprolol (LOPRESSOR) injection 5 mg  5 mg Intravenous Q6H PRN Dorothy Spark, MD   5 mg at 08/04/15 1221  . metoprolol (LOPRESSOR) tablet 50 mg  50 mg Oral BID Dorothy Spark, MD   50 mg at 08/08/15 0944  . morphine 2 MG/ML injection 1-2 mg  1-2 mg Intravenous Q2H PRN Rigoberto Noel, MD   2 mg at 08/06/15 0430  . ondansetron (ZOFRAN) injection 4 mg  4 mg Intravenous Q6H PRN Karmen Bongo, MD      . pantoprazole (PROTONIX) EC tablet 40 mg  40 mg Oral Daily Raylene Miyamoto, MD   40 mg at 08/08/15 0944  . polyethylene glycol (MIRALAX / GLYCOLAX) packet 17 g  17 g Oral TID PRN Karmen Bongo, MD      . primidone (MYSOLINE) tablet 250 mg  250 mg Oral BID Karmen Bongo, MD   250 mg at 08/08/15 0941  . promethazine (PHENERGAN) tablet 25 mg  25 mg Oral Q6H PRN Karmen Bongo, MD      . sodium chloride flush (NS) 0.9 % injection 10-40 mL  10-40 mL Intracatheter Q12H Raylene Miyamoto, MD   10 mL at 08/08/15 0530  . sodium chloride flush (NS) 0.9 %  injection 10-40 mL  10-40 mL Intracatheter PRN Raylene Miyamoto, MD      . sodium chloride flush (NS) 0.9 % injection 10-40 mL  10-40 mL Intracatheter Q12H Raylene Miyamoto, MD   10 mL at 08/06/15 0911  . sodium chloride flush (NS) 0.9 % injection 10-40 mL  10-40 mL Intracatheter PRN Raylene Miyamoto, MD         Discharge Medications: Please see discharge summary for a list of discharge medications.  Relevant Imaging Results:  Relevant Lab Results:   Additional Information [id number]  Samule Dry, LCSW

## 2015-08-08 NOTE — Progress Notes (Signed)
Progress Note    [name]  EHM:[id number] DOB: 01-Nov-1945  DOA: 08/02/2015 PCP: [name], MD    Brief Narrative:   Malik Zuniga is an 70 y.o. male with a PMH of prostate (remote), COPD, recurrent hospitalizations for treatment of aspiration pneumonia with recent diagnosis of stage IA lung cancer by needle biopsy 11/2014 treated with radiation therapy who was sent to the hospital by his pulmonologist on 08/02/15 for evaluation of disorientation with hallucinations, worsening weakness and found to have atrial flutter and concerns for sepsis/HCAP.  Admitted by pulmonology service. Care subsequently transferred to [facility] on 08/07/15.  Assessment/Plan:   Principal Problems:   Atrial flutter with rapid ventricular response ([place]) Documented heart rates as high as 160s on admission. Did not respond to maximum dose Cardizem. Subsequently treated with an amiodarone drip and digoxin load. Oral metoprolol subsequently added. Seen by cardiology. Therapeutic anticoagulation not initiated secondary to concerns of hemoptysis despite CHADS2vasc score of 5. 2-D echo done 08/06/15 and showed EF 50-55 percent with no regional wall motion abnormalities. Doppler parameters were normal. Therapeutic anticoagulation with heparin initiated 08/05/15 after hemoptysis resolved, now on Pradaxa. Spontaneously converted to sinus rhythm and rate controlling medications were changed from IV route to by mouth on 08/06/15.As per cardiology follow-up, maintaining sinus rhythm on combination of diltiazem and metoprolol, continue Pradaxa for anticoagulation and cardiology signed off 6/28.    Acute respiratory failure in the setting of COPD and acute on chronic diastolic CHF, class IV Intubated 08/03/15-08/04/15. Maintained on his usual doses of Pulmicort and provided with Duonebs. Solu-Medrol discontinued 08/06/15.Respiratory status stable.    Sepsis secondary to acute exacerbation of bronchiectasis versus another episode  of aspiration/dysphagia  Initially treated with cefepime/vancomycin. Sputum cultures grew normal respiratory flora and blood cultures are negative to date. Evaluated by speech therapist 08/04/15 with recommendations for nothing by mouth status. To further evaluate dysphasia, neurology was consulted. MRI of the brain and cervical spine were recommended, but can be done as outpatient. Muscle specific kinase antibodies were also checked and found to be negative. Vancomycin discontinued 08/05/15. Diet was advanced to dysphagia 2 thin liquids on 08/05/15. Continue Zosyn - day 6 of antibiotics today and may discontinue after today's dose.  Hemoptysis - Likely related to bronchiectasis exacerbation. Resolved.  Active Problems:   Delirium in the setting of Bipolar disorder ([place]) Complicated by polypharmacy. Patient continues to want combination of medications including muscle relaxers and opiates for pain control. Would try to limit these as much as possible and wean him slowly over time. Continue Abilify and Depakote.    Essential hypertension Blood pressure currently reasonable.    Primary cancer of right lower lobe of lung (HCC) Stage IA, treated with radiation therapy. Follow-up CT showed a decrease in size of the lesion with changes of radiation therapy in the adjacent right lung.    Moderate malnutrition ([place]) Evaluated by dietitian. Initially placed on tube feeds per dietary protocol. Now on oral intake with supplements ordered per dietitian recommendations.    Hypophosphatemia/hypokalemia Supplemented.    Hyperlipidemia Continue Lipitor. Note: The patient will need to be switched from simvastatin to Lipitor at discharge due to an interaction with diltiazem.   Family Communication/Anticipated D/C date and plan/Code Status   DVT prophylaxis: Lovenox ordered. Code Status: Full Code.  Family Communication: Wife updated at bedside on 6/28. Disposition Plan: CIR versus home health PT if not a  candidate.   Medical Consultants:    Pulmonology  Cardiology  Neurology  Procedures:   6/23 intubated 6/24 extubated  Anti-Infectives:   Cefepime 08/02/15---> 08/02/15 Vancomycin 08/02/15--->/08/05/15 Zosyn 08/02/15---> Flagyl 08/03/15---> 08/03/15  Subjective:   Malik Zuniga complains of ongoing productive cough without dyspnea or chest pain. No other complaints reported by patient or spouse at bedside.  Objective:    Filed Vitals:   08/07/15 2009 08/08/15 0430 08/08/15 0941 08/08/15 1450  BP: 147/79 143/79 151/79 118/74  Pulse: 89 72 74 72  Temp: 97.9 F (36.6 C) 97.6 F (36.4 C)  98.1 F (36.7 C)  TempSrc: Oral Oral  Oral  Resp: '20 18  17  '$ Height:      Weight:      SpO2: 98% 94%  90%    Intake/Output Summary (Last 24 hours) at 08/08/15 1606 Last data filed at 08/08/15 1350  Gross per 24 hour  Intake    390 ml  Output    950 ml  Net   -560 ml   Filed Weights   08/06/15 0318 08/06/15 2101 08/07/15 0411  Weight: 80.377 kg (177 lb 3.2 oz) 80.377 kg (177 lb 3.2 oz) 80.332 kg (177 lb 1.6 oz)    Exam: General exam: Appears calm and comfortable.  Respiratory system: Reduced breath sounds in the bases with few coarse crackles. Rest of lung fields clear to auscultation. Respiratory effort normal. Cardiovascular system: S1 & S2 heard, RRR. No JVD,  rubs, gallops or clicks. No murmurs. Telemetry: Sinus rhythm. Gastrointestinal system: Abdomen is nondistended, soft and nontender. No organomegaly or masses felt. Normal bowel sounds heard. Central nervous system: Alert and oriented 2. No focal neurological deficits. Extremities: No clubbing, edema, or cyanosis. Skin: No rashes, lesions or ulcers Psychiatry: Judgement and insight appear diminished. Mood & affect flat.   Data Reviewed:   I have personally reviewed following labs and imaging studies:  Labs: Basic Metabolic Panel:  Recent Labs Lab 08/02/15 2353 08/03/15 0215 08/03/15 0927 08/03/15 2045  08/04/15 0330 08/04/15 0750 08/04/15 1658 08/05/15 0325 08/06/15 0340 08/08/15 0530  NA  --  137  --   --  135  --   --  137 134* 133*  K  --  4.0  --   --  4.1  --   --  3.9 3.3* 4.0  CL  --  106  --   --  106  --   --  103 101 93*  CO2  --  22  --   --  22  --   --  '25 27 28  '$ GLUCOSE  --  104*  --   --  128*  --   --  142* 101* 96  BUN  --  12  --   --  8  --   --  '7 7 11  '$ CREATININE  --  0.73  --   --  0.67  --   --  0.58* 0.66 0.74  CALCIUM  --  8.2*  --   --  7.9*  --   --  8.4* 8.4* 8.6*  MG 1.6*  --  1.7 1.7  --  1.8 1.8  --   --   --   PHOS  --   --  2.8 2.6  --  2.1* 1.9*  --   --   --    GFR Estimated Creatinine Clearance: 88.7 mL/min (by C-G formula based on Cr of 0.74). Liver Function Tests:  Recent Labs Lab 08/02/15 1744  AST 18  ALT 13*  ALKPHOS 53  BILITOT 0.6  PROT 8.5*  ALBUMIN 3.4*   No results for input(s): LIPASE, AMYLASE in the last 168 hours. No results for input(s): AMMONIA in the last 168 hours. Coagulation profile  Recent Labs Lab 08/02/15 2353  INR 1.29    CBC:  Recent Labs Lab 08/02/15 1744  08/04/15 0330 08/05/15 0325 08/06/15 0340 08/07/15 0332 08/08/15 0530  WBC 13.2*  < > 8.1 8.1 6.9 6.6 7.4  NEUTROABS 10.9*  --  7.3  --   --   --   --   HGB 11.4*  < > 8.1* 8.5* 8.6* 9.0* 9.7*  HCT 33.8*  < > 25.3* 26.8* 26.5* 28.3* 29.8*  MCV 83.9  < > 84.6 84.0 83.1 83.7 83.9  PLT 629*  < > 421* 470* 465* 483* 483*  < > = values in this interval not displayed. Cardiac Enzymes: No results for input(s): CKTOTAL, CKMB, CKMBINDEX, TROPONINI in the last 168 hours. BNP (last 3 results) No results for input(s): PROBNP in the last 8760 hours. CBG:  Recent Labs Lab 08/07/15 2005 08/07/15 2332 08/08/15 0422 08/08/15 0759 08/08/15 1134  GLUCAP 105* 97 79 88 118*    Sepsis Labs:  Recent Labs Lab 08/02/15 1755 08/02/15 2155 08/02/15 2353 08/03/15 0215  08/05/15 0325 08/06/15 0340 08/07/15 0332 08/08/15 0530  PROCALCITON  --    --  <0.10  --   --   --   --   --   --   WBC  --   --   --  10.4  < > 8.1 6.9 6.6 7.4  LATICACIDVEN 1.32 1.38 1.2 1.4  --   --   --   --   --   < > = values in this interval not displayed. Microbiology Recent Results (from the past 240 hour(s))  Blood Culture (routine x 2)     Status: None   Collection Time: 08/02/15  5:44 PM  Result Value Ref Range Status   Specimen Description BLOOD RIGHT ANTECUBITAL  Final   Special Requests BOTTLES DRAWN AEROBIC AND ANAEROBIC 5CC EACH  Final   Culture   Final    NO GROWTH 5 DAYS Performed at [facility]    Report Status 08/07/2015 FINAL  Final  Blood Culture (routine x 2)     Status: None   Collection Time: 08/02/15  5:45 PM  Result Value Ref Range Status   Specimen Description BLOOD BLOOD LEFT FOREARM  Final   Special Requests BOTTLES DRAWN AEROBIC AND ANAEROBIC 5CC EACH  Final   Culture   Final    NO GROWTH 5 DAYS Performed at [facility]    Report Status 08/07/2015 FINAL  Final  Urine culture     Status: None   Collection Time: 08/02/15  8:07 PM  Result Value Ref Range Status   Specimen Description URINE, CLEAN CATCH  Final   Special Requests NONE  Final   Culture NO GROWTH Performed at [facility]   Final   Report Status 08/04/2015 FINAL  Final  MRSA PCR Screening     Status: None   Collection Time: 08/02/15 11:32 PM  Result Value Ref Range Status   MRSA by PCR NEGATIVE NEGATIVE Final    Comment:        The GeneXpert MRSA Assay (FDA approved for NASAL specimens only), is one component of a comprehensive MRSA colonization surveillance program. It is not intended to diagnose MRSA infection nor to guide or monitor treatment for MRSA infections.  Culture, respiratory (NON-Expectorated)     Status: None   Collection Time: 08/04/15  9:03 AM  Result Value Ref Range Status   Specimen Description TRACHEAL ASPIRATE  Final   Special Requests NONE  Final   Gram Stain   Final    ABUNDANT WBC PRESENT,  PREDOMINANTLY PMN FEW SQUAMOUS EPITHELIAL CELLS PRESENT FEW GRAM POSITIVE COCCI IN PAIRS RARE BUDDING YEAST SEEN    Culture Consistent with normal respiratory flora.  Final   Report Status 08/06/2015 FINAL  Final    Radiology: No results found.  Medications:   . antiseptic oral rinse  7 mL Mouth Rinse q12n4p  . ARIPiprazole  5 mg Oral Daily  . atorvastatin  20 mg Oral q1800  . budesonide  0.5 mg Nebulization BID  . chlorhexidine  15 mL Mouth Rinse BID  . dabigatran  150 mg Oral Q12H  . diltiazem  240 mg Oral Daily  . divalproex  1,000 mg Oral QHS  . docusate sodium  200 mg Oral QHS  . DULoxetine  60 mg Oral BID  . feeding supplement (ENSURE ENLIVE)  237 mL Oral BID BM  . insulin aspart  0-9 Units Subcutaneous Q4H  . ipratropium  0.5 mg Nebulization Q6H  . levalbuterol  0.63 mg Nebulization Q6H  . metoprolol tartrate  50 mg Oral BID  . pantoprazole  40 mg Oral Daily  . primidone  250 mg Oral BID  . sodium chloride flush  10-40 mL Intracatheter Q12H  . sodium chloride flush  10-40 mL Intracatheter Q12H   Continuous Infusions: . sodium chloride      Time spent: 35 minutes.  The patient is medically complex with multiple co-morbidities and is at high risk for clinical deterioration and requires high complexity decision making.    LOS: 6 days   HONGALGI,ANAND, MD, FACP, FHM. [facility] Pager [phone]  If 7PM-7AM, please contact night-coverage [email or web address] Password [facility] 08/08/2015, 4:06 PM

## 2015-08-08 NOTE — Discharge Instructions (Addendum)
Information on my medicine - Pradaxa (dabigatran)  This medication education was reviewed with me or my healthcare representative as part of my discharge preparation.  The pharmacist that spoke with me during my hospital stay was:  Wayland Salinas, [facility]  Why was Pradaxa prescribed for you? Pradaxa was prescribed for you to reduce the risk of forming blood clots that cause a stroke if you have a medical condition called atrial fibrillation (a type of irregular heartbeat).    What do you Need to know about PradAXa? Take your Pradaxa TWICE DAILY - one capsule in the morning and one tablet in the evening with or without food.  It would be best to take the doses about the same time each day.  The capsules should not be broken, chewed or opened - they must be swallowed whole.  Do not store Pradaxa in other medication containers - once the bottle is opened the Pradaxa should be used within FOUR months; throw away any capsules that havent been by that time.  Take Pradaxa exactly as prescribed by your doctor.  DO NOT stop taking Pradaxa without talking to the doctor who prescribed the medication.  Stopping without other stroke prevention medication to take the place of Pradaxa may increase your risk of developing a clot that causes a stroke.  Refill your prescription before you run out.  After discharge, you should have regular check-up appointments with your healthcare provider that is prescribing your Pradaxa.  In the future your dose may need to be changed if your kidney function or weight changes by a significant amount.  What do you do if you miss a dose? If you miss a dose, take it as soon as you remember on the same day.  If your next dose is less than 6 hours away, skip the missed dose.  Do not take two doses of PRADAXA at the same time.  Important Safety Information A possible side effect of Pradaxa is bleeding. You should call your healthcare provider right away if you  experience any of the following: ? Bleeding from an injury or your nose that does not stop. ? Unusual colored urine (red or dark brown) or unusual colored stools (red or black). ? Unusual bruising for unknown reasons. ? A serious fall or if you hit your head (even if there is no bleeding).  Some medicines may interact with Pradaxa and might increase your risk of bleeding or clotting while on Pradaxa. To help avoid this, consult your healthcare provider or pharmacist prior to using any new prescription or non-prescription medications, including herbals, vitamins, non-steroidal anti-inflammatory drugs (NSAIDs) and supplements.  This website has more information on Pradaxa (dabigatran): [email or web address].pradaxa.com  Atrial Flutter Atrial flutter is a heart rhythm that can cause the heart to beat very fast (tachycardia). It originates in the upper chambers of the heart (atria). In atrial flutter, the top chambers of the heart (atria) often beat much faster than the bottom chambers of the heart (ventricles). Atrial flutter has a regular "saw toothed" appearance in an EKG readout. An EKG is a test that records the electrical activity of the heart. Atrial flutter can cause the heart to beat up to 150 beats per minute (BPM). Atrial flutter can either be short lived (paroxysmal) or permanent.  CAUSES  Causes of atrial flutter can be many. Some of these include:  Heart related issues:  Heart attack (myocardial infarction).  Heart failure.  Heart valve problems.  Poorly controlled high blood pressure (hypertension).  After  open heart surgery.  Lung related issues:  A blood clot in the lungs (pulmonary embolism).  Chronic obstructive pulmonary disease (COPD). Medications used to treat COPD can attribute to atrial flutter.  Other related causes:  Hyperthyroidism.  Caffeine.  Some decongestant cold medications.  Low electrolyte levels such as potassium or  magnesium.  Cocaine. SYMPTOMS  An awareness of your heart beating rapidly (palpitations).  Shortness of breath.  Chest pain.  Low blood pressure (hypotension).  Dizziness or fainting. DIAGNOSIS  Different tests can be performed to diagnose atrial flutter.   An EKG.  Holter monitor. This is a 24-hour recording of your heart rhythm. You will also be given a diary. Write down all symptoms that you have and what you were doing at the time you experienced symptoms.  Cardiac event monitor. This small device can be worn for up to 30 days. When you have heart symptoms, you will push a button on the device. This will then record your heart rhythm.  Echocardiogram. This is an imaging test to look at your heart. Your caregiver will look at your heart valves and the ventricles.  Stress test. This test can help determine if the atrial flutter is related to exercise or if coronary artery disease is present.  Laboratory studies will look at certain blood levels like:  Complete blood count (CBC).  Potassium.  Magnesium.  Thyroid function. TREATMENT  Treatment of atrial flutter varies. A combination of therapies may be used or sometimes atrial flutter may need only 1 type of treatment.  Lab work: If your blood work, such as your electrolytes (potassium, magnesium) or your thyroid function tests, are abnormal, your caregiver will treat them accordingly.  Medication:  There are several different types of medications that can convert your heart to a normal rhythm and prevent atrial flutter from reoccurring.  Nonsurgical procedures: Nonsurgical techniques may be used to control atrial flutter. Some examples include:  Cardioversion. This technique uses either drugs or an electrical shock to restore a normal heart rhythm:  Cardioversion drugs may be given through an intravenous (IV) line to help "reset" the heart rhythm.  In electrical cardioversion, your caregiver shocks your heart with  electrical energy. This helps to reset the heartbeat to a normal rhythm.  Ablation. If atrial flutter is a persistent problem, an ablation may be needed. This procedure is done under mild sedation. High frequency radio-wave energy is used to destroy the area of heart tissue responsible for atrial flutter. SEEK IMMEDIATE MEDICAL CARE IF:  You have:  Dizziness.  Near fainting or fainting.  Shortness of breath.  Chest pain or pressure.  Sudden nausea or vomiting.  Profuse sweating. If you have the above symptoms, call your local emergency service immediately! Do not drive yourself to the hospital. MAKE SURE YOU:   Understand these instructions.  Will watch your condition.  Will get help right away if you are not doing well or get worse.   This information is not intended to replace advice given to you by your health care provider. Make sure you discuss any questions you have with your health care provider.   Document Released: 06/15/2008 Document Revised: 02/17/2014 Document Reviewed: 08/11/2014 Elsevier Interactive Patient Education 2016 [employer]  Dysphagia Diet Level 1, Pureed The dysphasia level 1 diet includes foods that are completely pureed and smooth. The foods have a pudding-like texture, such as the texture of pureed pancakes, mashed potatoes, and yogurt. The diet does not include foods with lumps or coarse textures. Liquids  should be smooth and may either be thin, nectar-thick, honey-like, or spoon-thick. This diet is helpful for people with moderate to severe swallowing problems. It reduces the risk of food getting caught in the windpipe, trachea, or lungs. You may need help or supervision during meals while following this diet. WHAT DO I NEED TO KNOW ABOUT THIS DIET? Foods  You may eat foods that are soft and have a pudding-like texture. If a food does not have this texture, you may be able to eat the food after:  Pureeing it. This can be done with a blender or  whisk.  Moistening it with liquid. For example, you may have bread if you soak it in milk or syrup.  Avoid foods that are hard, dry, sticky, chunky, lumpy, or stringy. Also avoid foods with nuts, seeds, raisins, skins, and pulp.  Do not eat foods that you have to chew. If you have to chew the food, then you cannot eat it.  Eat a variety of foods to get all the nutrients you need. Liquids  You may drink liquids that are smooth. Your health care provider will tell you if you should drink thin or thickened liquids.  To thicken a liquid, use a food and beverage thickener or a thickening food. Thickened liquids are usually a "pudding-like" consistency.  Thin liquids include fruit juices, milk, coffee, tea, yogurts, shakes, and similar foods that melt to thin liquid at room temperature.  Avoid liquids with seeds, pulp, or chunks. See your dietitian or health care provider regularly for help with your dietary changes. WHAT FOODS CAN I EAT? Grains Store-bought soft breads, pancakes, and [place] toast that have a smooth, moist texture and do not have nuts or seeds (you will need to moisten the food with liquid). Cooked cereals that have a pudding-like consistency, such as cream of wheat or farina (no oatmeal). Pureed, well-cooked pasta, rice, and plain bread stuffing. Vegetables Pureed vegetables. Soft avocado. Smooth tomato paste or sauce. Strained or pureed soups (these may need to be thickened as directed). Mashed or pureed potatoes without skin (can be seasoned with butter, smooth gravy, margarine, or sour cream). Fruits Pureed fruits such as melons and apples without seeds or pulp. Mashed bananas. Smooth tomato paste or sauce. Fruit juices without pulp or seeds. Strained or pureed soups. Meat and Other Protein Sources Pureed meat. Smooth pate or liverwurst. Smooth souffles. Pureed beans (such as lentils). Pureed eggs. Dairy Yogurt. Smooth cheese sauces. Milk (may need to be thickened).  Nutritional dairy drinks or shakes. Ask your health care provider whether you can have ice cream. Condiments Finely ground salt, pepper, and other ground spices. Sweets/Desserts Smooth puddings and custards. Pureed desserts. Souffles. Whipped topping. Ask your health care provider whether you can have frozen desserts. Fats and Oils Butter. Margarine. Smooth and strained gravy. Sour cream. Mayonnaise. Cream cheese. Whipped topping. Smooth sauces (such as white sauce, cheese sauce, or hollandaise sauce). The items listed above may not be a complete list of recommended foods or beverages. Contact your dietitian for more options. WHAT FOODS ARE NOT RECOMMENDED? Grains Oatmeal. Dry cereals. Hard breads. Vegetables Whole vegetables. Stringy vegetables (such as celery). Thin tomato sauce. Fruits Whole fresh, frozen, canned, or dried fruits that have not been pureed. Stringy fruits (such as pineapple). Meat and Other Protein Sources Whole or ground meat, fish, or poultry. Dried or cooked lentils or legumes that have been cooked but not mashed or pureed. Non-pureed eggs. Nuts and seeds. Peanut butter. Dairy Non-pureed cheese. Dairy  products with lumps or chunks. Ask your health care provider whether you can have ice cream. Condiments Coarse or seeded herbs and spices. Sweets/Desserts [place] preserves. Jams with seeds. Solid desserts. Sticky, chewy sweets (such as licorice and caramel). Ask your health care provider whether you can have frozen desserts. Fats and Oils Sauces of fats with lumps or chunks. The items listed above may not be a complete list of foods and beverages to avoid. Contact your dietitian for more information.   This information is not intended to replace advice given to you by your health care provider. Make sure you discuss any questions you have with your health care provider.   Document Released: 01/27/2005 Document Revised: 02/17/2014 Document Reviewed: 01/10/2013 Elsevier  Interactive Patient Education [employer].

## 2015-08-08 NOTE — Care Management Important Message (Signed)
Important Message  Patient Details  Name: Malik Zuniga MRN: [id number] Date of Birth: February 23, 1945   Medicare Important Message Given:  Yes    [name] 08/08/2015, 10:02 AM

## 2015-08-08 NOTE — Progress Notes (Signed)
    Subjective:  Feeling ok. Currently having a breathing treatment. No chest pain or dyspnea.   Objective:  Vital Signs in the last 24 hours: Temp:  [97.6 F (36.4 C)-97.9 F (36.6 C)] 97.6 F (36.4 C) (06/28 0430) Pulse Rate:  [72-89] 74 (06/28 0941) Resp:  [18-20] 18 (06/28 0430) BP: (143-151)/(79) 151/79 mmHg (06/28 0941) SpO2:  [94 %-98 %] 94 % (06/28 0430)  Intake/Output from previous day: 06/27 0701 - 06/28 0700 In: 30 [I.V.:30] Out: 500 [Urine:500]  Physical Exam: Pt is alert and oriented, NAD HEENT: normal Neck: JVP - normal Lungs: CTA bilaterally CV: RRR without murmur or gallop Abd: soft, NT Ext: no C/C/E, distal pulses intact and equal Skin: warm/dry no rash   Lab Results:  Recent Labs  08/07/15 0332 08/08/15 0530  WBC 6.6 7.4  HGB 9.0* 9.7*  PLT 483* 483*    Recent Labs  08/06/15 0340 08/08/15 0530  NA 134* 133*  K 3.3* 4.0  CL 101 93*  CO2 27 28  GLUCOSE 101* 96  BUN 7 11  CREATININE 0.66 0.74   No results for input(s): TROPONINI in the last 72 hours.  Invalid input(s): CK, MB  Tele: Personally reviewed: normal sinus rhythm  Assessment/Plan:  Atrial flutter with RVR: maintaining sinus rhythm on combination of diltiazem and metoprolol. Continue pradaxa for anticoagulation. Stable from cardiac perspective. Will sign off, but please call if any further issues arise. Gaynell Face, M.D. 08/08/2015, 2:07 PM

## 2015-08-09 ENCOUNTER — Ambulatory Visit: Payer: Medicare Other | Admitting: Physical Therapy

## 2015-08-09 LAB — BASIC METABOLIC PANEL
Anion gap: 8 (ref 5–15)
BUN: 6 mg/dL (ref 6–20)
CO2: 29 mmol/L (ref 22–32)
CREATININE: 0.59 mg/dL — AB (ref 0.61–1.24)
Calcium: 8.7 mg/dL — ABNORMAL LOW (ref 8.9–10.3)
Chloride: 93 mmol/L — ABNORMAL LOW (ref 101–111)
GFR calc Af Amer: >60 mL/min (ref >60)
GLUCOSE: 80 mg/dL (ref 65–99)
POTASSIUM: 4.2 mmol/L (ref 3.5–5.1)
SODIUM: 130 mmol/L — AB (ref 135–145)

## 2015-08-09 LAB — GLUCOSE, CAPILLARY
GLUCOSE-CAPILLARY: 92 mg/dL (ref 65–99)
GLUCOSE-CAPILLARY: 96 mg/dL (ref 65–99)
Glucose-Capillary: 85 mg/dL (ref 65–99)
Glucose-Capillary: 93 mg/dL (ref 65–99)

## 2015-08-09 MED ORDER — ATORVASTATIN CALCIUM 20 MG PO TABS: 20.0000 mg | ORAL_TABLET | Freq: Every day | ORAL | Status: DC

## 2015-08-09 MED ORDER — DILTIAZEM HCL ER COATED BEADS 240 MG PO CP24: 240.0000 mg | ORAL_CAPSULE | Freq: Every day | ORAL | Status: DC

## 2015-08-09 MED ORDER — ENSURE ENLIVE PO LIQD: 237.0000 mL | Freq: Two times a day (BID) | ORAL | Status: DC

## 2015-08-09 MED ORDER — GUAIFENESIN 100 MG/5ML PO SOLN: 5.0000 mL | ORAL | Status: DC | PRN

## 2015-08-09 MED ORDER — AMOXICILLIN-POT CLAVULANATE 875-125 MG PO TABS
1.0000 | ORAL_TABLET | Freq: Two times a day (BID) | ORAL | Status: DC
  Administered 2015-08-09: 1 via ORAL
  Filled 2015-08-09: qty 1

## 2015-08-09 MED ORDER — DABIGATRAN ETEXILATE MESYLATE 150 MG PO CAPS: 150.0000 mg | ORAL_CAPSULE | Freq: Two times a day (BID) | ORAL | Status: DC

## 2015-08-09 MED ORDER — METOPROLOL TARTRATE 50 MG PO TABS: 50.0000 mg | ORAL_TABLET | Freq: Two times a day (BID) | ORAL | Status: DC

## 2015-08-09 MED ORDER — AMOXICILLIN-POT CLAVULANATE 875-125 MG PO TABS: 1.0000 | ORAL_TABLET | Freq: Two times a day (BID) | ORAL | Status: DC

## 2015-08-09 NOTE — Discharge Summary (Signed)
Physician Discharge Summary  Malik Zuniga ZJQ:[id number] DOB: 23-Dec-1945  PCP: [name], MD  Admit date: 08/02/2015 Discharge date: 08/09/2015  Admitted From: Home Disposition:  Home. Patient and spouse declined CIR or SNF.  Recommendations for Outpatient Follow-up:  1. Dr. [name], PCP in one week with repeat labs (CBC & BMP). 2. Dr. Simonne Maffucci, Pulmonology in one week. 3. Dr. Wells Guiles Tat, Neurology 4. Dr. Daneen Schick, Cardiology in 2 weeks.  Home Health: PT, OT, RN & ST Equipment/Devices: None    Discharge Condition: Improved and stable.  CODE STATUS: Full   Diet recommendation: Dysphagia 1 diet and thin liquids. This can be further advanced based on speech therapy evaluation as outpatient.  Brief/Interim Summary: Malik Zuniga is an 70 y.o. male with a PMH of prostate (remote), COPD, recurrent hospitalizations for treatment of aspiration pneumonia with recent diagnosis of stage IA lung cancer by needle biopsy 11/2014 treated with radiation therapy who was sent to the hospital by his pulmonologist on 08/02/15 for evaluation of disorientation with hallucinations, worsening weakness and found to have atrial flutter and concerns for sepsis/HCAP. Admitted by pulmonology service. Care subsequently transferred to [facility] on 08/07/15.  Discharge Diagnoses:  Principal Problem:   Atrial flutter with rapid ventricular response (HCC) Active Problems:   Hyperlipidemia   Bipolar disorder (HCC)   Essential hypertension   CAD (coronary artery disease)   HCAP (healthcare-associated pneumonia)   Recurrent aspiration pneumonia ([place])   Primary cancer of right lower lobe of lung (HCC)   Dysphagia   Sepsis ([place])   Cancer of lower lobe of right lung (HCC)   Aspiration pneumonia (HCC)   COPD (chronic obstructive pulmonary disease) (HCC)   Acute respiratory failure (HCC)   Acute on chronic diastolic CHF (congestive heart failure), NYHA class 4 (HCC)   Moderate malnutrition  (HCC)   Bronchiectasis (HCC)   Cerebrovascular disease, arteriosclerotic, post-stroke   Acute delirium   Chronic bilateral low back pain with bilateral sciatica   Adjustment disorder with mixed anxiety and depressed mood   Paroxysmal atrial fibrillation (HCC)   Diplopia   Tachypnea   Hypokalemia   Absolute anemia  Principal Problems:  Atrial flutter with rapid ventricular response ([place]) Documented heart rates as high as 160s on admission. Did not respond to maximum dose Cardizem. Subsequently treated with an amiodarone drip and digoxin load. Oral metoprolol subsequently added. Seen by cardiology. Therapeutic anticoagulation not initiated secondary to concerns of hemoptysis despite CHADS2vasc score of 5. 2-D echo done 08/06/15 and showed EF 50-55 percent with no regional wall motion abnormalities. Doppler parameters were normal. Therapeutic anticoagulation with heparin initiated 08/05/15 after hemoptysis resolved, now on Pradaxa. Spontaneously converted to sinus rhythm and rate controlling medications were changed from IV route to by mouth on 08/06/15.As per cardiology follow-up, maintaining sinus rhythm on combination of diltiazem and metoprolol, continue Pradaxa for anticoagulation and cardiology signed off 6/28.   Acute respiratory failure in the setting of COPD and acute on chronic diastolic CHF, class IV Intubated 08/03/15-08/04/15. Maintained on his usual doses of Pulmicort and provided with Duonebs. Solu-Medrol discontinued 08/06/15.Respiratory status stable. Patient on home oxygen at 2.5 L/m at bedtime.   Sepsis secondary to acute exacerbation of bronchiectasis versus another episode of aspiration/dysphagia  Initially treated with cefepime/vancomycin. Sputum cultures grew normal respiratory flora and blood cultures are negative to date. Evaluated by speech therapist 08/04/15 with recommendations for nothing by mouth status. To further evaluate dysphasia, neurology was consulted. MRI of the  brain and cervical spine  were recommended, but can be done as outpatient. Muscle specific kinase antibodies were also checked and found to be negative. Vancomycin discontinued 08/05/15. Diet was advanced to dysphagia 1 thin liquids on 08/05/15.  Patient has completed 6 days of IV antibiotics. Spouse is concerned that patient still has some productive cough with brownish sputum. She indicates that patient was given a prescription for oral Augmentin of which she took 4 tablets prior to hospitalization. They were instructed to complete the course of Augmentin and advised them that due to history of bronchiectasis, patient may continue to have some degree of productive cough intermittently.  Hemoptysis - Likely related to bronchiectasis exacerbation. Resolved.  Active Problems:  Delirium in the setting of Bipolar disorder ([place]) Complicated by polypharmacy. Patient continues to want combination of medications including muscle relaxers and opiates for pain control. Would try to limit these as much as possible and wean him slowly over time. Continue Abilify and Depakote. Mental status at baseline. Close follow-up with PCP regarding management of his outpatient polypharmacy and consider if some of these medications can be discontinued.   Essential hypertension Blood pressure currently reasonable. Metoprolol was increased and diltiazem was newly added in the hospital and hence ARB was discontinued.   Primary cancer of right lower lobe of lung (HCC) Stage IA, treated with radiation therapy. Follow-up CT showed a decrease in size of the lesion with changes of radiation therapy in the adjacent right lung.   Moderate malnutrition ([place]) Evaluated by dietitian. Initially placed on tube feeds per dietary protocol. Now on oral intake with supplements ordered per dietitian recommendations.   Hypophosphatemia/hypokalemia Supplemented.   Hyperlipidemia Continue Lipitor. Switched from simvastatin to Lipitor at  discharge due to an interaction with diltiazem.  Anemia Stable. Follow CBCs as outpatient. No overt bleeding.  Hyponatremia - Clinically euvolemic.? SIADH. Stable. Follow BMP as outpatient.   Discussed at length with patient and spouse at bedside today. They both indicate that he does not wish to go to inpatient rehabilitation or SNF even if a bed becomes available and are insistent on returning home. They were advised that he will not get as much therapies at home as he would at [facility] for inpatient [facility] but they insist on going home. Arranged for home health services.        Discharge Instructions      Discharge Instructions    Call MD for:  difficulty breathing, headache or visual disturbances    Complete by:  As directed      Call MD for:  extreme fatigue    Complete by:  As directed      Call MD for:  persistant dizziness or light-headedness    Complete by:  As directed      Call MD for:  persistant nausea and vomiting    Complete by:  As directed      Call MD for:  severe uncontrolled pain    Complete by:  As directed      Call MD for:  temperature >100.4    Complete by:  As directed      Discharge instructions    Complete by:  As directed   Continue prior home oxygen. Diet: Dysphagia 1 diet and thin liquids as per instructions provided.     Increase activity slowly    Complete by:  As directed             Medication List    STOP taking these medications  aspirin-acetaminophen-caffeine 250-250-65 MG tablet  Commonly known as:  EXCEDRIN MIGRAINE     FLUTTER Devi     losartan 50 MG tablet  Commonly known as:  COZAAR     metoprolol succinate 50 MG 24 hr tablet  Commonly known as:  TOPROL-XL     simvastatin 40 MG tablet  Commonly known as:  ZOCOR      TAKE these medications        amoxicillin-clavulanate 875-125 MG tablet  Commonly known as:  AUGMENTIN  Take 1 tablet by mouth every 12 (twelve) hours. Patient and spouse indicate that  they have these at home-complete course.     ARIPiprazole 10 MG tablet  Commonly known as:  ABILIFY  Take 5 mg by mouth daily.     atorvastatin 20 MG tablet  Commonly known as:  LIPITOR  Take 1 tablet (20 mg total) by mouth daily at 6 PM.     budesonide 0.25 MG/2ML nebulizer solution  Commonly known as:  PULMICORT  Take 4 mLs (0.5 mg total) by nebulization 2 (two) times daily.     cholecalciferol 1000 units tablet  Commonly known as:  VITAMIN D  Take 1,000 Units by mouth every morning.     dabigatran 150 MG Caps capsule  Commonly known as:  PRADAXA  Take 1 capsule (150 mg total) by mouth every 12 (twelve) hours.     diazepam 5 MG tablet  Commonly known as:  VALIUM  Take 1 tablet (5 mg total) by mouth every 8 (eight) hours as needed for anxiety or muscle spasms. scheduled     diltiazem 240 MG 24 hr capsule  Commonly known as:  CARDIZEM CD  Take 1 capsule (240 mg total) by mouth daily.     divalproex 500 MG 24 hr tablet  Commonly known as:  DEPAKOTE ER  Take 1,000 mg by mouth at bedtime.     docusate sodium 100 MG capsule  Commonly known as:  COLACE  Take 200 mg by mouth at bedtime.     DULoxetine 60 MG capsule  Commonly known as:  CYMBALTA  Take 120 mg by mouth daily.     feeding supplement (ENSURE ENLIVE) Liqd  Take 237 mLs by mouth 2 (two) times daily between meals.     fluticasone 50 MCG/ACT nasal spray  Commonly known as:  FLONASE  PLACE 2 SPRAYS INTO BOTH NOSTRILS DAILY.     guaiFENesin 100 MG/5ML Soln  Commonly known as:  ROBITUSSIN  Take 5 mLs (100 mg total) by mouth every 4 (four) hours as needed for cough or to loosen phlegm.     HYDROcodone-acetaminophen 10-325 MG tablet  Commonly known as:  NORCO  Take 1 tablet by mouth every 6 (six) hours as needed for moderate pain. for pain     ipratropium-albuterol 0.5-2.5 (3) MG/3ML Soln  Commonly known as:  DUONEB  Take 3 mLs by nebulization every 6 (six) hours.     metoprolol 50 MG tablet  Commonly known  as:  LOPRESSOR  Take 1 tablet (50 mg total) by mouth 2 (two) times daily.     montelukast 10 MG tablet  Commonly known as:  SINGULAIR  Take 1 tablet (10 mg total) by mouth at bedtime.     nitroGLYCERIN 0.4 MG SL tablet  Commonly known as:  NITROSTAT  PLACE 1 TABLET (0.4 MG TOTAL) UNDER THE TONGUE EVERY 5 (FIVE) MINUTES AS NEEDED FOR CHEST PAIN.     omeprazole 40 MG capsule  Commonly known  as:  PRILOSEC  Take 40 mg by mouth 2 (two) times daily.     polyethylene glycol packet  Commonly known as:  MIRALAX / GLYCOLAX  Take 17 g by mouth 3 (three) times daily as needed for moderate constipation. For constipation     primidone 250 MG tablet  Commonly known as:  MYSOLINE  TAKE 1 TABLET (250 MG TOTAL) BY MOUTH 2 (TWO) TIMES DAILY.     promethazine 25 MG tablet  Commonly known as:  PHENERGAN  Take 25 mg by mouth every 6 (six) hours as needed for nausea or vomiting. Reported on 04/06/2015     ranitidine 150 MG tablet  Commonly known as:  ZANTAC  Take 150-300 mg by mouth 2 (two) times daily as needed for heartburn.     tiZANidine 4 MG tablet  Commonly known as:  ZANAFLEX  TAKE 1 TABLET BY MOUTH EVERY 8 HOURS AS NEEDED FOR MUSCLE SPASMS.     vitamin C 1000 MG tablet  Take 1,000 mg by mouth daily.     Zinc 50 MG Tabs  Take 50 mg by mouth daily.       Follow-up Information    Follow up with [facility].   Specialty:  [place]   Why:  HH-RN/PT/OT/Speech arranged- they will call you to set up home visits   Contact information:   [address] [place] [address] [phone]       Follow up with [name], MD. Schedule an appointment as soon as possible for a visit in 1 week.   Specialty:  Family Medicine   Why:  To be seen with repeat labs (CBC & BMP).   Contact information:   [address] [place] [address] [phone]       Follow up with Simonne Maffucci, MD. Schedule an appointment as soon as possible for a visit  in 1 week.   Specialty:  Pulmonary Disease   Contact information:   [place] [address] [phone]       Schedule an appointment as soon as possible for a visit with TAT, [place], DO.   Specialty:  Neurology   Contact information:   [address]  [place] [address] [phone]       Follow up with Belva Crome III, MD. Schedule an appointment as soon as possible for a visit in 2 weeks.   Specialty:  Cardiology   Contact information:   [address] [place] [address] [phone]      Allergies  Allergen Reactions  . Lisinopril Swelling    ANGIOEDEMA  . Lamictal [Lamotrigine] Other (See Comments)    Weakness/difficulty swallowing  . Ambien [Zolpidem] Other (See Comments)    Unknown reaction    Consultations:  Cardiology  CCM   Procedures/Studies: Dg Ribs Unilateral Right  07/16/2015  CLINICAL DATA:  Fall last week, right anterior and posterior lower rib pain EXAM: RIGHT RIBS - 2 VIEW COMPARISON:  06/29/2015 FINDINGS: Cardiothymic silhouette is stable. Metallic fixation rods thoracolumbar spine again noted. Stable scarring in right lower lobe. No right rib fracture is identified. There is no pneumothorax. IMPRESSION: Again noted scarring in right lower lobe. No infiltrate or pulmonary edema. No right rib fracture is identified. There is no pneumothorax. Electronically Signed   By: Lahoma Crocker M.D.   On: 07/16/2015 17:25   Dg Chest Port 1 View  08/05/2015  CLINICAL DATA:  Acute respiratory failure EXAM: PORTABLE CHEST 1 VIEW COMPARISON:  08/03/2015 FINDINGS: Cardiomediastinal silhouette is stable. Endotracheal tube has been removed. Right IJ central line is unchanged in position. No pulmonary edema. Persistent small left pleural effusion with left basilar atelectasis or infiltrate. No pneumothorax. IMPRESSION: Endotracheal tube has been removed. No pneumothorax. Stable right adjacent line position. Small left pleural  effusion weight left basilar atelectasis or infiltrate. Electronically Signed   By: Lahoma Crocker M.D.   On: 08/05/2015 09:17   Dg Chest Port 1 View  08/03/2015  CLINICAL DATA:  Intubated EXAM: PORTABLE CHEST 1 VIEW COMPARISON:  08/03/2015 FINDINGS: Cardiomediastinal silhouette is stable. There is endotracheal tube in place with tip 6.6 cm above the carina. Stable NG tube position. Stable right IJ central line position. No pulmonary edema. There is streaky left basilar atelectasis or infiltrate. No pneumothorax. Please note the left apex is obscured by patient's overlying chin. IMPRESSION: There is endotracheal tube in place with tip 6.6 cm above the carina. Stable NG tube position. Stable right IJ central line position. No pulmonary edema. There is streaky left basilar atelectasis or infiltrate. No pneumothorax. Electronically Signed   By: Lahoma Crocker M.D.   On: 08/03/2015 18:37   Dg Chest Port 1 View  08/03/2015  CLINICAL DATA:  Central line placement. EXAM: PORTABLE CHEST 1 VIEW COMPARISON:  Earlier today at 0919 hours. FINDINGS: endotracheal tube remains high, 9.9 cm above the carina. Nasogastric extends beyond the inferior aspect of the film. Right internal jugular line tip at mid SVC. Normal heart size. No pleural effusion or pneumothorax. Patchy bibasilar airspace disease. IMPRESSION: Right internal jugular line appropriately positioned without pneumothorax. Endotracheal tube remains somewhat high and could be advanced 3-4 cm. Persistent bibasilar airspace disease. Electronically Signed   By: Abigail Miyamoto M.D.   On: 08/03/2015 10:22   Dg Chest Port 1 View  08/03/2015  CLINICAL DATA:  Intubation and nasogastric tube placement. EXAM: PORTABLE CHEST 1 VIEW COMPARISON:  08/02/2015 FINDINGS: Two frontal views of the chest. Endotracheal tube is difficult to visualize centrally but is felt to terminate 9.2 cm above the carina, well below the level of the clavicles. Nasogastric tube terminates at the body of the  stomach, but the side-port is not visualized and may be at or just above the gastroesophageal junction. thoracolumbar spine fixation hardware and multiple overlying wires and leads. Patient rotated left. Normal heart size. No pleural effusion or pneumothorax. Underlying hyperinflation and bibasilar interstitial thickening. Especially compared to 07/16/15, increased right base opacity. There is also mild left retrocardiac airspace disease. IMPRESSION: Endotracheal tube 9.2 cm above carina. This could be advanced 2-3 cm for optimum positioning. Nasogastric tube at the body of the stomach. Side port may be at or just above the gastroesophageal junction. Consider advancement. Worsening bibasilar aeration. atelectasis or developing infection/aspiration. Electronically Signed   By: Abigail Miyamoto M.D.   On: 08/03/2015 09:34   Dg Chest Portable 1 View  08/02/2015  CLINICAL DATA:  Possible sepsis. History of hypertension, emphysema, MI, lung cancer, bronchitis, pneumonia. History of CAD, asthma, and shortness of breath. EXAM: PORTABLE CHEST 1 VIEW COMPARISON:  07/16/2015 FINDINGS: Patient is rotated towards the left. Heart size is normal. There is mild streaky density at the right lung base, stable over prior exams and consistent with atelectasis or scarring. No focal consolidations or pleural effusions. No pulmonary edema. Spinal fixation rods again noted. IMPRESSION: No evidence for acute cardiopulmonary abnormality. Electronically Signed   By: Nolon Nations M.D.  On: 08/02/2015 17:46   Dg Swallowing Func-speech Pathology  08/05/2015  Objective Swallowing Evaluation: Type of Study: MBS-Modified Barium Swallow Study Patient Details Name: LINDBERG ZENON MRN: [id number] Date of Birth: Jan 14, 1946 Today's Date: 08/05/2015 Time: SLP Start Time (ACUTE ONLY): 1135-SLP Stop Time (ACUTE ONLY): 1200 SLP Time Calculation (min) (ACUTE ONLY): 25 min Past Medical History: Past Medical History Diagnosis Date . Hyperlipidemia    takes  Simvastatin daily . Tremor  . History of prostate cancer 2004 . On home O2  . Neuromuscular disorder ([place]) 1998   right carpal tunnel release . Anemia associated with acute blood loss  . GERD (gastroesophageal reflux disease)    takes Omeprazole daily . Hypertension    takes Metoprolol daily as well as Hyzaar . Constipation    takes Colace daily as well as Miralax . Anxiety    takes Valium daily . Depression    takes Cymbalta daily . Emphysema of lung (HCC)    Albuterol as needed;Symbicort daily and Singulair at bedtime . Myocardial infarction ([place]) 04/1998 . Coronary artery disease  . Asthma  . Shortness of breath    with exertion . History of bronchitis  . Aspiration pneumonia ([place]) 2010 . Headache, chronic daily  . History of migraine    last migraine a couple of days ago;takes Excedrin Migraine . Chronic back pain    compression fracture . History of colon polyps  . Mood change ([place])    after Brain surgery mood changed and was placed on Depakote . Insomnia    takes Benadryl nightly . 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 Lung cancer ([place]) 2016 Past Surgical History: Past Surgical History Procedure Laterality Date . Cholecystectomy  1996 . Craniotomy  1999   to clip aneurism, Dr. Annette Stable . Vascular stent  2000   Dr. Rollene Fare; he reports cardiac stent, but denies stent for PAD 06/15/13 . Hand surgery  1989   crushed thumb, Dr. Fredna Dow . Ulnar nerve repair  1998   left arm, Dr. Fredna Dow . Gastrostomy w/ feeding tube  2008   Dr. Watt Climes; only had for a few months . Prostatectomy  04/2001   removal of prostate cancer, Dr. Janice Norrie . Coronary angioplasty with stent placement  04/1998 . Carpal tunnel release  1998   right . Spine surgery   . Brain surgery  1999   clip aneurysm . Back surgery  08/2004; 02/2005; 04/2006; 06/2007; 7/20101   all by Dr. Annette Stable . Esophagogastroduodenoscopy N/A 07/23/2012   Procedure: ESOPHAGOGASTRODUODENOSCOPY (EGD);  Surgeon: Jeryl Columbia, MD;  Location: [facility] ENDOSCOPY;  Service: Endoscopy;  Laterality: N/A;  buccini  /ja . Colonoscopy   . Back surgery  05/2013 . Dg biopsy lung   HPI: Malik Zuniga is a 70 y.o. male with medical history significant of prostate (remote) and recent lung cancer; recent hospitalization and recurrent hospitalizations for aspiration PNA. He was hospitalized 1 month ago for a week for aspiration pneumonia. Was seen today for routine hospital f/u with pulm today and sent to [facility] ER. Wife reports that he started getting sick last Monday night and has gotten progressively worse. Started with generalized weakness, generalized pain, decreased PO intake, and cough. +hemoptysis, started last night (no history of prior). Has had persistent PNA since October, per wife report. Has taken Ensure but minimal food over the last few days. She has worked hard on giving him only a soft mechanical diet since last hospitalization.  Cough was productive of brown sputum yesterday. +SOB, more than  usual. +nasal congestion. No CP/palpitations.  Very, very weak - much more difficulty with mobility. Increased back pain since last fall.  Pt is known to ST service from previous MBS.  Most recent was conducted on 07/05/2015 with recommendation for a dysphagia 1 diet with nectar thick liquids.  The patient was seen once for oupt follow up. Most recent chest x-ray is showing streaky left basilar atelectasis vs infiltrate.  Subjective: The patient was seen sitting upright in bed with his wife and son present.  Assessment / Plan / Recommendation CHL IP CLINICAL IMPRESSIONS 08/05/2015 Therapy Diagnosis Moderate oral phase dysphagia;Mild pharyngeal phase dysphagia Clinical Impression Pt currently presenting with a mild-moderate oropharyngeal dysphagia.  Dysphagia characteristics similar to recent study on 07/05/15; however, today silent aspiration was observed x1 with thin liquids by straw- pt continues to have poor bolus containment resulting in premature spill to the level of the pyriform sinuses prior to swallow  initiation. No penetration or aspiration observed with multiple trials of thin liquid by cup with pt cued to "take a small sip". Across consistencies pt with mild-moderate residuals at the level of vallecula and pyriform sinuses which appears to be due to pt's head/ neck position being slightly downward, along with decreased anterior hyolaryngeal movement; most of residuals were cleared when pt cued to swallow again. Was difficult to visualize UES for most of study due to positioning; however, was able to see following a puree bolus with which there was the appearance of a cricopharyngeal bar with minimal residuals. Recommend initiating dysphagia 2 diet with thin liquids by cup- no straws, meds whole in puree, full supervision to cue pt to take small bites/ sips, multiple swallows, and intermittent throat clear. Ensure pt is sitting upright during meals looking straight ahead and not leaning forward; remain upright 30-60 minutes after meal. Will continue to follow closely for diet tolerance.  Impact on safety and function Moderate aspiration risk   CHL IP TREATMENT RECOMMENDATION 08/05/2015 Treatment Recommendations Therapy as outlined in treatment plan below   Prognosis 08/05/2015 Prognosis for Safe Diet Advancement Fair Barriers to Reach Goals -- Barriers/Prognosis Comment -- CHL IP DIET RECOMMENDATION 08/05/2015 SLP Diet Recommendations Dysphagia 2 (Fine chop) solids;Thin liquid Liquid Administration via Cup;No straw Medication Administration Whole meds with puree Compensations Slow rate;Small sips/bites;Multiple dry swallows after each bite/sip;Clear throat intermittently Postural Changes Seated upright at 90 degrees   CHL IP OTHER RECOMMENDATIONS 08/05/2015 Recommended Consults -- Oral Care Recommendations Oral care BID Other Recommendations --   CHL IP FOLLOW UP RECOMMENDATIONS 08/05/2015 Follow up Recommendations Outpatient SLP   CHL IP FREQUENCY AND DURATION 08/05/2015 Speech Therapy Frequency (ACUTE ONLY) min  2x/week Treatment Duration 2 weeks      CHL IP ORAL PHASE 08/05/2015 Oral Phase Impaired Oral - Pudding Teaspoon -- Oral - Pudding Cup -- Oral - Honey Teaspoon -- Oral - Honey Cup -- Oral - Nectar Teaspoon -- Oral - Nectar Cup -- Oral - Nectar Straw -- Oral - Thin Teaspoon -- Oral - Thin Cup Premature spillage;Decreased bolus cohesion Oral - Thin Straw Decreased bolus cohesion;Premature spillage Oral - Puree Decreased bolus cohesion;Premature spillage Oral - Mech Soft Decreased bolus cohesion;Premature spillage Oral - Regular -- Oral - Multi-Consistency -- Oral - Pill -- Oral Phase - Comment --  CHL IP PHARYNGEAL PHASE 08/05/2015 Pharyngeal Phase Impaired Pharyngeal- Pudding Teaspoon -- Pharyngeal -- Pharyngeal- Pudding Cup -- Pharyngeal -- Pharyngeal- Honey Teaspoon -- Pharyngeal -- Pharyngeal- Honey Cup -- Pharyngeal -- Pharyngeal- Nectar Teaspoon -- Pharyngeal -- Pharyngeal- Nectar  Cup -- Pharyngeal -- Pharyngeal- Nectar Straw -- Pharyngeal -- Pharyngeal- Thin Teaspoon -- Pharyngeal -- Pharyngeal- Thin Cup Delayed swallow initiation-pyriform sinuses;Reduced anterior laryngeal mobility;Pharyngeal residue - valleculae;Pharyngeal residue - pyriform Pharyngeal -- Pharyngeal- Thin Straw Delayed swallow initiation-pyriform sinuses;Reduced anterior laryngeal mobility;Penetration/Aspiration before swallow;Pharyngeal residue - valleculae;Pharyngeal residue - pyriform Pharyngeal Material enters airway, passes BELOW cords without attempt by patient to eject out (silent aspiration) Pharyngeal- Puree Delayed swallow initiation-vallecula;Reduced anterior laryngeal mobility;Pharyngeal residue - pyriform;Pharyngeal residue - valleculae Pharyngeal -- Pharyngeal- Mechanical Soft Delayed swallow initiation-vallecula;Reduced anterior laryngeal mobility;Pharyngeal residue - valleculae;Pharyngeal residue - pyriform Pharyngeal -- Pharyngeal- Regular -- Pharyngeal -- Pharyngeal- Multi-consistency -- Pharyngeal -- Pharyngeal- Pill --  Pharyngeal -- Pharyngeal Comment --  CHL IP CERVICAL ESOPHAGEAL PHASE 08/05/2015 Cervical Esophageal Phase Impaired Pudding Teaspoon -- Pudding Cup -- Honey Teaspoon -- Honey Cup -- Nectar Teaspoon -- Nectar Cup -- Nectar Straw -- Thin Teaspoon -- Thin Cup -- Thin Straw -- Puree -- Mechanical Soft -- Regular -- Multi-consistency -- Pill -- Cervical Esophageal Comment -- CHL IP GO 08/04/2015 Functional Assessment Tool Used ASHA NOMS and clinical judgment.   Functional Limitations Swallowing Swallow Current Status [phone]) CM Swallow Goal Status (W6568) CK Swallow Discharge Status (L2751) (None) Motor Speech Current Status [phone]) (None) Motor Speech Goal Status [phone]) (None) Motor Speech Goal Status [phone]) (None) Spoken Language Comprehension Current Status [phone]) (None) Spoken Language Comprehension Goal Status (K5993) (None) Spoken Language Comprehension Discharge Status [phone]) (None) Spoken Language Expression Current Status [phone]) (None) Spoken Language Expression Goal Status [phone]) (None) Spoken Language Expression Discharge Status [phone]) (None) Attention Current Status (M2263) (None) Attention Goal Status (F3545) (None) Attention Discharge Status [phone]) (None) Memory Current Status (S9373) (None) Memory Goal Status (S2876) (None) Memory Discharge Status (O1157) (None) Voice Current Status (W6203) (None) Voice Goal Status (T5974) (None) Voice Discharge Status (B6384) (None) Other Speech-Language Pathology Functional Limitation [phone]) (None) Other Speech-Language Pathology Functional Limitation Goal Status (O0321) (None) Other Speech-Language Pathology Functional Limitation Discharge Status [phone]) (None) Kern Reap, MA, CCC-SLP 08/05/2015, 1:12 PM [phone]            Intubated 08/03/15 and extubated 08/04/15.    Subjective: States that he feels "okay" and indicates that his coughing has decreased. No dyspnea, chest pain or palpitations reported. Patient and spouse indicate that they are declining SNF  for rehabilitation admission and wish to go home  Discharge Exam:  Filed Vitals:   08/09/15 0229 08/09/15 0445 08/09/15 0744 08/09/15 1323  BP: 115/70 154/69    Pulse:  62    Temp:  98.1 F (36.7 C)    TempSrc:  Oral    Resp:  20    Height:      Weight:  75.932 kg (167 lb 6.4 oz)    SpO2: 98% 95% 93% 92%    General exam: Appears calm and comfortable.  Respiratory system: Reduced breath sounds in the bases with few coarse crackles. Rest of lung fields clear to auscultation. Respiratory effort normal. Cardiovascular system: S1 & S2 heard, RRR. No JVD, rubs, gallops or clicks. No murmurs. Telemetry: Sinus rhythm. Gastrointestinal system: Abdomen is nondistended, soft and nontender. No organomegaly or masses felt. Normal bowel sounds heard. Central nervous system: Alert and oriented 2. No focal neurological deficits. Extremities: No clubbing, edema, or cyanosis. Skin: No rashes, lesions or ulcers Psychiatry: Judgement and insight appear diminished. Mood & affect flat.     The results of significant diagnostics from this hospitalization (including imaging, microbiology, ancillary and laboratory) are listed below for reference.  Microbiology: Recent Results (from the past 240 hour(s))  Blood Culture (routine x 2)     Status: None   Collection Time: 08/02/15  5:44 PM  Result Value Ref Range Status   Specimen Description BLOOD RIGHT ANTECUBITAL  Final   Special Requests BOTTLES DRAWN AEROBIC AND ANAEROBIC 5CC EACH  Final   Culture   Final    NO GROWTH 5 DAYS Performed at [facility]    Report Status 08/07/2015 FINAL  Final  Blood Culture (routine x 2)     Status: None   Collection Time: 08/02/15  5:45 PM  Result Value Ref Range Status   Specimen Description BLOOD BLOOD LEFT FOREARM  Final   Special Requests BOTTLES DRAWN AEROBIC AND ANAEROBIC 5CC EACH  Final   Culture   Final    NO GROWTH 5 DAYS Performed at [facility]    Report Status 08/07/2015  FINAL  Final  Urine culture     Status: None   Collection Time: 08/02/15  8:07 PM  Result Value Ref Range Status   Specimen Description URINE, CLEAN CATCH  Final   Special Requests NONE  Final   Culture NO GROWTH Performed at [facility]   Final   Report Status 08/04/2015 FINAL  Final  MRSA PCR Screening     Status: None   Collection Time: 08/02/15 11:32 PM  Result Value Ref Range Status   MRSA by PCR NEGATIVE NEGATIVE Final    Comment:        The GeneXpert MRSA Assay (FDA approved for NASAL specimens only), is one component of a comprehensive MRSA colonization surveillance program. It is not intended to diagnose MRSA infection nor to guide or monitor treatment for MRSA infections.   Culture, respiratory (NON-Expectorated)     Status: None   Collection Time: 08/04/15  9:03 AM  Result Value Ref Range Status   Specimen Description TRACHEAL ASPIRATE  Final   Special Requests NONE  Final   Gram Stain   Final    ABUNDANT WBC PRESENT, PREDOMINANTLY PMN FEW SQUAMOUS EPITHELIAL CELLS PRESENT FEW GRAM POSITIVE COCCI IN PAIRS RARE BUDDING YEAST SEEN    Culture Consistent with normal respiratory flora.  Final   Report Status 08/06/2015 FINAL  Final     Labs: BNP (last 3 results)  Recent Labs  06/25/15 1734  BNP 24.5   Basic Metabolic Panel:  Recent Labs Lab 08/02/15 2353  08/03/15 0927 08/03/15 2045 08/04/15 0330 08/04/15 0750 08/04/15 1658 08/05/15 0325 08/06/15 0340 08/08/15 0530 08/09/15 0445  NA  --   < >  --   --  135  --   --  137 134* 133* 130*  K  --   < >  --   --  4.1  --   --  3.9 3.3* 4.0 4.2  CL  --   < >  --   --  106  --   --  103 101 93* 93*  CO2  --   < >  --   --  22  --   --  '25 27 28 29  '$ GLUCOSE  --   < >  --   --  128*  --   --  142* 101* 96 80  BUN  --   < >  --   --  8  --   --  '7 7 11 6  '$ CREATININE  --   < >  --   --  0.67  --   --  0.58* 0.66 0.74 0.59*  CALCIUM  --   < >  --   --  7.9*  --   --  8.4* 8.4* 8.6* 8.7*  MG 1.6*   --  1.7 1.7  --  1.8 1.8  --   --   --   --   PHOS  --   --  2.8 2.6  --  2.1* 1.9*  --   --   --   --   < > = values in this interval not displayed. Liver Function Tests:  Recent Labs Lab 08/02/15 1744  AST 18  ALT 13*  ALKPHOS 53  BILITOT 0.6  PROT 8.5*  ALBUMIN 3.4*   No results for input(s): LIPASE, AMYLASE in the last 168 hours. No results for input(s): AMMONIA in the last 168 hours. CBC:  Recent Labs Lab 08/02/15 1744  08/04/15 0330 08/05/15 0325 08/06/15 0340 08/07/15 0332 08/08/15 0530  WBC 13.2*  < > 8.1 8.1 6.9 6.6 7.4  NEUTROABS 10.9*  --  7.3  --   --   --   --   HGB 11.4*  < > 8.1* 8.5* 8.6* 9.0* 9.7*  HCT 33.8*  < > 25.3* 26.8* 26.5* 28.3* 29.8*  MCV 83.9  < > 84.6 84.0 83.1 83.7 83.9  PLT 629*  < > 421* 470* 465* 483* 483*  < > = values in this interval not displayed. Cardiac Enzymes: No results for input(s): CKTOTAL, CKMB, CKMBINDEX, TROPONINI in the last 168 hours. BNP: Invalid input(s): POCBNP CBG:  Recent Labs Lab 08/08/15 2003 08/09/15 0027 08/09/15 0443 08/09/15 0804 08/09/15 1128  GLUCAP 114* 96 85 93 92   D-Dimer No results for input(s): DDIMER in the last 72 hours. Hgb A1c No results for input(s): HGBA1C in the last 72 hours. Lipid Profile No results for input(s): CHOL, HDL, LDLCALC, TRIG, CHOLHDL, LDLDIRECT in the last 72 hours. Thyroid function studies No results for input(s): TSH, T4TOTAL, T3FREE, THYROIDAB in the last 72 hours.  Invalid input(s): FREET3 Anemia work up No results for input(s): VITAMINB12, FOLATE, FERRITIN, TIBC, IRON, RETICCTPCT in the last 72 hours. Urinalysis    Component Value Date/Time   COLORURINE YELLOW 08/02/2015 2007   APPEARANCEUR CLEAR 08/02/2015 2007   LABSPEC 1.029 08/02/2015 2007   PHURINE 6.0 08/02/2015 2007   GLUCOSEU NEGATIVE 08/02/2015 2007   HGBUR NEGATIVE 08/02/2015 2007   BILIRUBINUR NEGATIVE 08/02/2015 2007   KETONESUR NEGATIVE 08/02/2015 2007   PROTEINUR NEGATIVE 08/02/2015 2007    UROBILINOGEN 0.2 10/23/2013 1127   NITRITE NEGATIVE 08/02/2015 2007   LEUKOCYTESUR NEGATIVE 08/02/2015 2007   Sepsis Labs Invalid input(s): PROCALCITONIN,  WBC,  LACTICIDVEN    Time coordinating discharge: Over 30 minutes  SIGNED:  Vernell Leep, MD, FACP, FHM. [facility] Pager [phone]  If 7PM-7AM, please contact night-coverage [email or web address] Password [email or web address] 08/09/2015, 1:28 PM

## 2015-08-09 NOTE — Progress Notes (Signed)
Right IJ discontinued per order. Pt tolerated well; on bedrest until 3:15pm.  Wife at bedside.

## 2015-08-09 NOTE — Care Management Note (Signed)
Case Management Note   RN, BSN Unit 2W-Case Manager [phone]  Patient Details  Name: Paolo T Dockter MRN: [id number] Date of Birth: 03/23/1945  Subjective/Objective:  Pt admitted with Aflutter , acute resp. Failure and sepsis               Action/Plan: PTA pt lived at home with wife- per PT recommendations CIR consulted for possible admissions  Expected Discharge Date:  08/09/15              Expected Discharge Plan:  Home w Home Health Services  In-House Referral:     Discharge planning Services  CM Consult  Post Acute Care Choice:  Home Health Choice offered to:  Spouse, Patient  DME Arranged:  N/A DME Agency:  NA  HH Arranged:  RN, Disease Management, PT, OT, Speech Therapy HH Agency:  [facility]  Status of Service:  Completed, signed off  If discussed at Long Length of Stay Meetings, dates discussed:  08/09/15  Additional Comments:  08/09/15- 1030-   RN, CM- per rounds this am- referral received for HH needs- msg that pt and wife now wanted to return home and not go to CIR- met with pt and wife at bedside- discussed d/c needs- per conversation pt's wife states that they have all needed DME at home- and have no DME needs at this time- she states that they no longer want to go to CIR or any other type of rehab but would rather return home- state that pt has had HH in the past and does not feel like he needs it - explained that with the recommendations for INPT rehab that it might benefit pt to have HH at discharge for close f/u and to give him the best possible outcome for discharge and to help prevent readmission- explained HRI program that was recommended for pt with [facility] (through [facility]) - wife agreeable to HH with [facility]- PTA pt had recently completed outpt therapy at [facility] and had just started outpt Speech- willl request HH orders from MD for HHRN/PT/OT/ST- referral made to Edwina with [facility] for HH- HRI program -   ,   Hall, RN 08/09/2015, 2:03 PM  

## 2015-08-09 NOTE — Progress Notes (Signed)
Speech Language Pathology Treatment: Dysphagia  Patient Details Name: Malik Zuniga MRN: [id number] DOB: 1945/06/25 Today's Date: 08/09/2015 Time: 2694-8546 SLP Time Calculation (min) (ACUTE ONLY): 14 min  Assessment / Plan / Recommendation Clinical Impression  Patient sleeping soundly upon arrival, had finished am meal. Wife present and reported likely d/c today. Educated wife regarding results of MBS, aspiration precautions, and compensatory strategies. Wife reported that she and patient would prefer dysphagia 1 (puree) diet rather than dysphagia 2 due to observed coughing with advanced textures. Educated wife on methods of pureeing food at home after d/c. All questions answered. Will continue to f/u if patient does not discharge today. Otherwise, recommend OP f/u SLP.    HPI HPI: Malik Zuniga is a 70 y.o. male with medical history significant of prostate (remote) and recent lung cancer; recent hospitalization and recurrent hospitalizations for aspiration PNA. He was hospitalized 1 month ago for a week for aspiration pneumonia. Was seen today for routine hospital f/u with pulm today and sent to [facility] ER. Wife reports that he started getting sick last Monday night and has gotten progressively worse. Started with generalized weakness, generalized pain, decreased PO intake, and cough. +hemoptysis, started last night (no history of prior). Has had persistent PNA since October, per wife report. Has taken Ensure but minimal food over the last few days. She has worked hard on giving him only a soft mechanical diet since last hospitalization.  Cough was productive of brown sputum yesterday. +SOB, more than usual. +nasal congestion. No CP/palpitations.  Very, very weak - much more difficulty with mobility. Increased back pain since last fall.  Pt is known to ST service from previous MBS.  Most recent was conducted on 07/05/2015 with recommendation for a dysphagia 1 diet with nectar thick liquids.   The patient was seen once for oupt follow up. Most recent chest x-ray is showing streaky left basilar atelectasis vs infiltrate.       SLP Plan  Continue with current plan of care (if patient does not d/c)     Recommendations  Diet recommendations: Dysphagia 1 (puree);Thin liquid Liquids provided via: Cup;No straw Medication Administration: Crushed with puree Supervision: Patient able to self feed;Full supervision/cueing for compensatory strategies Compensations: Slow rate;Small sips/bites;Multiple dry swallows after each bite/sip;Clear throat intermittently Postural Changes and/or Swallow Maneuvers: Seated upright 90 degrees             Oral Care Recommendations: Oral care BID Follow up Recommendations: Outpatient SLP Plan: Continue with current plan of care (if patient does not d/c)     [place], CCC-SLP [phone]   Jerusalem Wert Meryl 08/09/2015, 11:52 AM

## 2015-08-10 ENCOUNTER — Telehealth: Payer: Self-pay | Admitting: Neurology

## 2015-08-10 ENCOUNTER — Other Ambulatory Visit: Payer: Self-pay

## 2015-08-10 ENCOUNTER — Telehealth: Payer: Self-pay | Admitting: *Deleted

## 2015-08-10 ENCOUNTER — Other Ambulatory Visit: Payer: Self-pay | Admitting: *Deleted

## 2015-08-10 DIAGNOSIS — I4892 Unspecified atrial flutter: Secondary | ICD-10-CM

## 2015-08-10 DIAGNOSIS — J441 Chronic obstructive pulmonary disease with (acute) exacerbation: Secondary | ICD-10-CM

## 2015-08-10 DIAGNOSIS — J9620 Acute and chronic respiratory failure, unspecified whether with hypoxia or hypercapnia: Secondary | ICD-10-CM | POA: Diagnosis not present

## 2015-08-10 NOTE — Telephone Encounter (Signed)
Pt's spouse [name] returned RN's call.

## 2015-08-10 NOTE — Telephone Encounter (Signed)
Transition Care Management Follow-up Telephone Call  PCP: [name], MD  Admit date: 08/02/2015 Discharge date: 08/09/2015  Admitted From: Home Disposition: Home. Patient and spouse declined CIR or SNF.  Recommendations for Outpatient Follow-up:  1. Dr. [name], PCP in one week with repeat labs (CBC & BMP). 2. Dr. Simonne Maffucci, Pulmonology in one week. 3. Dr. Wells Guiles Tat, Neurology 4. Dr. Daneen Schick, Cardiology in 2 weeks.  Home Health: PT, OT, RN & ST Equipment/Devices: None   Discharge Condition: Improved and stable.  CODE STATUS: Full  Diet recommendation: Dysphagia 1 diet and thin liquids. This can be further advanced based on speech therapy evaluation as outpatient.  --   How have you been since you were released from the hospital? "Very very weak, but better." Things went well with [facility] RN today according to wife. They helped pt get up from the chair and go to the bathroom and he has since been able to get up independently a few times.    Do you understand why you were in the hospital? yes   Do you understand the discharge instructions? yes   Where were you discharged to? Home   Items Reviewed:  Medications reviewed: yes  Allergies reviewed: yes  Dietary changes reviewed: yes, dysphagia I/dysphagia II, thin liquids, no straws (but wife reports he is using straw some at home d/t his tremors)  Referrals reviewed: yes, HH RN, PT, OT, SLP   Functional Questionnaire:   Activities of Daily Living (ADLs):   He states they are independent in the following: ambulation, feeding, continence and toileting States they require assistance with the following: bathing and hygiene, grooming and dressing   Any transportation issues/concerns?: no   Any patient concerns? no   Confirmed importance and date/time of follow-up visits scheduled yes  Provider Appointment booked with Dr. [name] 08/15/15 @ 11am (pt also has upcoming appts w/  cardiology and pulmonology)  Confirmed with patient if condition begins to worsen call PCP or go to the ER.  Patient was given the office number and encouraged to call back with question or concerns.  : yes

## 2015-08-10 NOTE — Patient Outreach (Addendum)
[facility]) Care Management  08/10/2015  Malik Zuniga [birth year] [id number]  Telephone call from patient wife stating that her husband had come home from the hospital and was telling her that he had talked with Claude and remembered something about a nebulizer machine. RN explained to wife that he was told that some educational material on a nebulizer would be sent to him. Per wife the patient was suppose to have a therapy vest like he had in the hospital. Per wife he doesn't have it. RN noted in Dr notes it had been ordered from [facility] and told the wife  to follow up with them.    Plan: RN referred to community case management   [place] Management [phone]

## 2015-08-10 NOTE — Patient Outreach (Signed)
New referral: Placed call to patient. Identity verified.  Patient reports that he is doing well.  States that he has been able to get up and go to bathroom.  When I began to ask questions, patient states I would have to talk with his wife and she was mowing the yard. Patient requested a call back on Monday.  Plan: Will call back to speak with wife on Monday per patients request.  No care plan completed yet as unable to complete call. Tomasa Rand, RN, BSN, CEN [facility] [employer] [phone]

## 2015-08-10 NOTE — Telephone Encounter (Signed)
They have decided to proceed with MR. Given number to [facility] and they will call to reschedule.

## 2015-08-10 NOTE — Addendum Note (Signed)
Addended by: Dorrene German on: 08/10/2015 03:30 PM   Modules accepted: Medications

## 2015-08-10 NOTE — Telephone Encounter (Signed)
Pt and wife not available at time of TCM call. Pt's wife will call back this afternoon after [facility] RN completes visit.

## 2015-08-10 NOTE — Telephone Encounter (Signed)
PT's wife [name] called in regards to an MRI he was supposed to have/Dawn CB# [phone]

## 2015-08-11 DIAGNOSIS — J9620 Acute and chronic respiratory failure, unspecified whether with hypoxia or hypercapnia: Secondary | ICD-10-CM | POA: Diagnosis not present

## 2015-08-11 DIAGNOSIS — I4892 Unspecified atrial flutter: Secondary | ICD-10-CM | POA: Diagnosis not present

## 2015-08-13 ENCOUNTER — Other Ambulatory Visit: Payer: Self-pay

## 2015-08-13 ENCOUNTER — Telehealth: Payer: Self-pay | Admitting: Family Medicine

## 2015-08-13 DIAGNOSIS — J9620 Acute and chronic respiratory failure, unspecified whether with hypoxia or hypercapnia: Secondary | ICD-10-CM | POA: Diagnosis not present

## 2015-08-13 DIAGNOSIS — I4892 Unspecified atrial flutter: Secondary | ICD-10-CM | POA: Diagnosis not present

## 2015-08-13 NOTE — Telephone Encounter (Signed)
Caller with [facility] states that pt had a fall at home yesterday and states no injury seen, caller states that pt is feeling some pain on left side and hip.

## 2015-08-13 NOTE — Patient Outreach (Signed)
THN transition of care call: Reviewed reasons for call.  Reviewed purpose of transition of care program and weekly outreaches.  Placed call and spoke with wife at request of patient.  Wife reports that patient is doing well. States that he fell yesterday stepping off scale.  No injury.  Wife reports that MD requested patient weigh daily. Wife reports that patient is regaining his appetite. States that he is drinking one can of ensure per day.  Reports eating well at home. Wife reports that [facility] is active and is seeing patient for OT, PT, ST and nursing.  Reports OT already saw patient today and PT is coming later today.  Wife reports that she will provide all transportation.  States that if she has problems getting him in the care she has someone to help her.   Wife reports that she thinks things are going well. Patient has follow up planned with primary MD this week and specialist appointments next week.  PLAN:  Offered home visit and wife has accepted.  Will see patient for home visit on 08/21/2015. Provided my contact information for wife to call if needed.  Will send barrier letter and care plan to MD.   [facility] CM Care Plan Problem One        Most Recent Value   Care Plan Problem One  Recent hospital admission for shortness of breath   Role Documenting the Problem One  Care Management [place] for Problem One  Active   THN Long Term Goal (31-90 days)  Patient and or wife will report no readmissions in the next 31 days.   THN Long Term Goal Start Date  08/13/15   Interventions for Problem One Long Term Goal  Reivewed discharge instructions with wife via phone. Planned for home visit.  Encouraged wife to call MD for any concerns   THN CM Short Term Goal #1 (0-30 days)  Patient will follow up with primary MD in 10 days   THN CM Short Term Goal #1 Start Date  08/13/15   Interventions for Short Term Goal #1  Reviewed importance of timely follow up with primary MD. Encouraged  wife to call for any transportation problems.    THN CM Short Term Goal #2 (0-30 days)  Patient will verbalize that he is weighing and recording weights daily for 30 days   THN CM Short Term Goal #2 Start Date  08/06/15   Interventions for Short Term Goal #2  Reviewed CHF zones and need to report weight gain to MD.      Tomasa Rand, RN, BSN, CEN [place] Coordinator [phone]

## 2015-08-15 ENCOUNTER — Telehealth: Payer: Self-pay | Admitting: Family Medicine

## 2015-08-15 ENCOUNTER — Encounter: Payer: Self-pay | Admitting: Family Medicine

## 2015-08-15 ENCOUNTER — Ambulatory Visit (INDEPENDENT_AMBULATORY_CARE_PROVIDER_SITE_OTHER): Payer: Medicare Other | Admitting: Family Medicine

## 2015-08-15 VITALS — BP 134/87 | HR 74 | Temp 97.9°F | Resp 16 | Ht 70.0 in

## 2015-08-15 DIAGNOSIS — I10 Essential (primary) hypertension: Secondary | ICD-10-CM

## 2015-08-15 DIAGNOSIS — J690 Pneumonitis due to inhalation of food and vomit: Secondary | ICD-10-CM | POA: Diagnosis not present

## 2015-08-15 DIAGNOSIS — I4892 Unspecified atrial flutter: Secondary | ICD-10-CM | POA: Diagnosis not present

## 2015-08-15 DIAGNOSIS — J471 Bronchiectasis with (acute) exacerbation: Secondary | ICD-10-CM | POA: Diagnosis not present

## 2015-08-15 DIAGNOSIS — I480 Paroxysmal atrial fibrillation: Secondary | ICD-10-CM

## 2015-08-15 DIAGNOSIS — J9620 Acute and chronic respiratory failure, unspecified whether with hypoxia or hypercapnia: Secondary | ICD-10-CM | POA: Diagnosis not present

## 2015-08-15 DIAGNOSIS — E785 Hyperlipidemia, unspecified: Secondary | ICD-10-CM

## 2015-08-15 LAB — CBC WITH DIFFERENTIAL/PLATELET
BASOS ABS: 0.1 10*3/uL (ref 0.0–0.1)
Basophils Relative: 0.8 % (ref 0.0–3.0)
EOS ABS: 0.2 10*3/uL (ref 0.0–0.7)
Eosinophils Relative: 2.6 % (ref 0.0–5.0)
HCT: 34.8 % — ABNORMAL LOW (ref 39.0–52.0)
Hemoglobin: 11.2 g/dL — ABNORMAL LOW (ref 13.0–17.0)
LYMPHS ABS: 1.9 10*3/uL (ref 0.7–4.0)
LYMPHS PCT: 25 % (ref 12.0–46.0)
MCHC: 32.3 g/dL (ref 30.0–36.0)
MCV: 85.7 fl (ref 78.0–100.0)
Monocytes Absolute: 0.5 10*3/uL (ref 0.1–1.0)
Monocytes Relative: 6 % (ref 3.0–12.0)
NEUTROS ABS: 5 10*3/uL (ref 1.4–7.7)
NEUTROS PCT: 65.6 % (ref 43.0–77.0)
PLATELETS: 467 10*3/uL — AB (ref 150.0–400.0)
RBC: 4.06 Mil/uL — AB (ref 4.22–5.81)
RDW: 17.8 % — ABNORMAL HIGH (ref 11.5–15.5)
WBC: 7.6 10*3/uL (ref 4.0–10.5)

## 2015-08-15 LAB — BASIC METABOLIC PANEL
BUN: 19 mg/dL (ref 6–23)
CALCIUM: 9.2 mg/dL (ref 8.4–10.5)
CO2: 26 meq/L (ref 19–32)
CREATININE: 0.82 mg/dL (ref 0.40–1.50)
Chloride: 102 mEq/L (ref 96–112)
GFR: 98.68 mL/min (ref >60.00)
GLUCOSE: 91 mg/dL (ref 70–99)
Potassium: 5 mEq/L (ref 3.5–5.1)
SODIUM: 134 meq/L — AB (ref 135–145)

## 2015-08-15 NOTE — Telephone Encounter (Signed)
Verbal ok given and swallow studies faxed to [phone].

## 2015-08-15 NOTE — Telephone Encounter (Signed)
Caller with Malik Zuniga asking for verbal orders for speech therapy for 1 x a wk for 4 wks. Also caller is asking for copies of pt swallowing studies that have been done on 5/25 and 6/25

## 2015-08-15 NOTE — Progress Notes (Signed)
   Subjective:    Patient ID: Malik Zuniga, male    DOB: 07-Jun-1945, 70 y.o.   MRN: [id number]  [facility] f/u- pt was admitted 6/22-29 for recurrent aspiration PNA and sepsis.  Pt is now on a dysphagia I diet w/ thin liquids.  Pt is to have outpt f/u w/ speech and neuro.  For Sepsis, pt was tx'd w/ Vanc and d/c'd on pre-hospital Augmentin.  Finished yesterday.  No fevers.  During hospitalization he was found to have Aflutter w/ RVR and after conversion to sinus rhythm, he was maintained on Dilt and Metoprolol and started on Pradaxa.  Since he was started on Dilt and metoprolol, his ARB was stopped.  BP is well controlled today.  'i feel ok except I'm extremely tired and extremely weak'.  Pt reports he is now having [place] nursing, PT/OT, speech. No current CP, palpitations, SOB, cough has improved. Pt is using home O2 at night and periodically throughout the day.   Review of Systems For ROS see HPI     Objective:   Physical Exam  Constitutional: He is oriented to person, place, and time. No distress.  Frail elderly man sitting in wheelchair  HENT:  Head: Normocephalic and atraumatic.  Cardiovascular: Normal rate, regular rhythm, normal heart sounds and intact distal pulses.   Pulmonary/Chest: Effort normal and breath sounds normal. No respiratory distress. He has no wheezes. He has no rales.  Neurological: He is alert and oriented to person, place, and time.  Skin: Skin is warm and dry. There is pallor.  Psychiatric: He has a normal mood and affect. His behavior is normal. Thought content normal.  Vitals reviewed.         Assessment & Plan:

## 2015-08-15 NOTE — Progress Notes (Signed)
Pre visit review using our clinic review tool, if applicable. No additional management support is needed unless otherwise documented below in the visit note. 

## 2015-08-15 NOTE — Patient Instructions (Signed)
Follow up as scheduled/needed We'll notify you of your lab results and make any changes if needed No med changes at this time I would recommend a pulse ox to have at home so you can better rely on when you need your oxygen Take your time!  The recovery will come but it will take awhile! Call with any questions or concerns Hang in there!!

## 2015-08-15 NOTE — Telephone Encounter (Signed)
[place] for verbal? Please advise if ok to give copies of swallow studies since we did not order these.

## 2015-08-15 NOTE — Telephone Encounter (Signed)
Caller is an OT with [facility] asking for verbal orders to see pt 2 x a wk for 1 wk and then 1 x a wk for 2 wks to help with upper body strength and balance.

## 2015-08-15 NOTE — Telephone Encounter (Signed)
Ok to proceed. 

## 2015-08-15 NOTE — Telephone Encounter (Signed)
Called and gave the verbal ok to proceed with OT.

## 2015-08-15 NOTE — Telephone Encounter (Signed)
Noted.  If pain continues, will need appt for evaluation

## 2015-08-15 NOTE — Telephone Encounter (Signed)
[place] for order and can send results of swallow studies for speech therapy to review

## 2015-08-16 DIAGNOSIS — I4892 Unspecified atrial flutter: Secondary | ICD-10-CM | POA: Diagnosis not present

## 2015-08-16 DIAGNOSIS — J9620 Acute and chronic respiratory failure, unspecified whether with hypoxia or hypercapnia: Secondary | ICD-10-CM | POA: Diagnosis not present

## 2015-08-17 ENCOUNTER — Telehealth: Payer: Self-pay | Admitting: Family Medicine

## 2015-08-17 DIAGNOSIS — J9620 Acute and chronic respiratory failure, unspecified whether with hypoxia or hypercapnia: Secondary | ICD-10-CM | POA: Diagnosis not present

## 2015-08-17 DIAGNOSIS — I4892 Unspecified atrial flutter: Secondary | ICD-10-CM | POA: Diagnosis not present

## 2015-08-17 NOTE — Telephone Encounter (Signed)
Patient fell down this after around 1:45 while going to the bathroom in his home. This happed before the therapist arrived.  Patient hit his head and rates the pain in his head a 7 out of 10, patient also reports being nauseated.  Therapist states he told the patient to seek treatment at the ER, however, pt declined.

## 2015-08-17 NOTE — Telephone Encounter (Signed)
Pt must go to ER for evaluation if head is hurting b/c he is on blood thinner and this can be very dangerous

## 2015-08-17 NOTE — Telephone Encounter (Signed)
Called and advised the patient's wife that the patient should be evaluated at the hospital due to the blood thinners. She said that she would advise him of your concerns but she could not make any promises that he would go. I again stated the importance of being evaluated at the emergency room and again she said she could not make him go if he did not want to.

## 2015-08-20 DIAGNOSIS — J449 Chronic obstructive pulmonary disease, unspecified: Secondary | ICD-10-CM | POA: Diagnosis not present

## 2015-08-20 DIAGNOSIS — J9620 Acute and chronic respiratory failure, unspecified whether with hypoxia or hypercapnia: Secondary | ICD-10-CM | POA: Diagnosis not present

## 2015-08-20 DIAGNOSIS — I4892 Unspecified atrial flutter: Secondary | ICD-10-CM | POA: Diagnosis not present

## 2015-08-20 DIAGNOSIS — J690 Pneumonitis due to inhalation of food and vomit: Secondary | ICD-10-CM | POA: Diagnosis not present

## 2015-08-21 ENCOUNTER — Other Ambulatory Visit: Payer: Self-pay

## 2015-08-21 NOTE — Patient Outreach (Signed)
Malik Zuniga) Care Management   08/21/2015  Malik Zuniga 10-17-1945 [id number]  Malik Zuniga is an 70 y.o. male Arrived for home visit. Wife present and actively engaged during home visit. Subjective: Patient reports that he is weak from being in the hospital. States that he is currently active with [facility] for nursing, PT, OT, and speech therapy. Wife reports patients main need is with speech. Patient and wife report that patient has swallowing problems for several years. Reports that patient now how silent aspiration but also gets choked at home. Reports that patient has not had any choking episodes since he was discharged home from hospital.  Wife states that patient will have an MRI on 09/07/2015 to evaluate neck muscles. Wife report they have very little warning that patient is getting sick. States last admission that patient was in church and then did not feel well and had to leave church with assistance of 4 others.  Wife states that when patient got to hospital that his blood pressure was very low.  Patient states that he is on oxygen at night only. Patient admits to 2 recent falls. States that he fell when stepping off the scales. Reports that he stepped on the edge of the scale and the scale tipped over. 2nd fall was when patient went to let the dog out of the bathroom and he bent over and fell. Patient refused to go to the hospital despite encouragement from MD. Wife reports that she manages all medications and most of patients needs. Reports that she walks with patient to the bathroom in case he needs assistance. Patient states that he sleeps in his recliner.   Objective:  Patient sitting in recliner. Humped over with a neck pillow. Patient ambulating during home visit humped over. Unsteady gait noted.  Awake. Appears sleepy. Often difficult to understand. Filed Vitals:   08/21/15 1104  BP: 128/64  Pulse: 73  Resp: 18  Height: 1.753 m (_0 )  Weight: 154  lb 11.2 oz (70.171 kg)  SpO2: 92%   Review of Systems  HENT: Negative.   Eyes: Positive for double vision.  Respiratory: Negative.        Denies any shortness of breath at this time. States that he wears oxygen at night. Reports normal pulse ox without oxygen is 92%.   Gastrointestinal: Negative.   Genitourinary: Negative.   Musculoskeletal: Positive for joint pain and falls.       Reports chronic lower back pain. 2 recent falls since hospital discharge.  Skin: Negative.   Neurological: Positive for speech change and weakness.  Endo/Heme/Allergies: Negative.   Psychiatric/Behavioral: Negative.     Physical Exam  Constitutional: He is oriented to person, place, and time.  Thin and fragile appearing  Cardiovascular: Normal rate.   Respiratory: Effort normal and breath sounds normal.  Lungs clear at this time  GI: Soft. Bowel sounds are normal.  Musculoskeletal:  Humped over. Protruding bony prominences in the upper back and neck.   Neurological: He is alert and oriented to person, place, and time.  Skin: Skin is warm and dry.  Feet without lesions or sores. Bruised noted the right arm and left leg.     Encounter Medications:   Outpatient Encounter Prescriptions as of 08/21/2015  Medication Sig Note  . ARIPiprazole (ABILIFY) 10 MG tablet Take 5 mg by mouth daily.    . Ascorbic Acid (VITAMIN C) 1000 MG tablet Take 1,000 mg by mouth daily.   Marland Kitchen atorvastatin (  LIPITOR) 20 MG tablet Take 1 tablet (20 mg total) by mouth daily at 6 PM.   . budesonide (PULMICORT) 0.25 MG/2ML nebulizer solution Take 4 mLs (0.5 mg total) by nebulization 2 (two) times daily.   . cholecalciferol (VITAMIN D) 1000 UNITS tablet Take 1,000 Units by mouth every morning.    . dabigatran (PRADAXA) 150 MG CAPS capsule Take 1 capsule (150 mg total) by mouth every 12 (twelve) hours.   . diazepam (VALIUM) 5 MG tablet Take 1 tablet (5 mg total) by mouth every 8 (eight) hours as needed for anxiety or muscle spasms.  scheduled   . diltiazem (CARDIZEM CD) 240 MG 24 hr capsule Take 1 capsule (240 mg total) by mouth daily.   . divalproex (DEPAKOTE) 500 MG 24 hr tablet Take 1,000 mg by mouth at bedtime.    . docusate sodium (COLACE) 100 MG capsule Take 200 mg by mouth at bedtime.    . DULoxetine (CYMBALTA) 60 MG capsule Take 120 mg by mouth daily.    . feeding supplement, ENSURE ENLIVE, (ENSURE ENLIVE) LIQD Take 237 mLs by mouth 2 (two) times daily between meals. 08/13/2015: Drinking one can per day  . fluticasone (FLONASE) 50 MCG/ACT nasal spray PLACE 2 SPRAYS INTO BOTH NOSTRILS DAILY. (Patient taking differently: PLACE 2 SPRAYS INTO BOTH NOSTRILS DAILY as needed for allergies)   . guaiFENesin (MUCINEX) 600 MG 12 hr tablet Take 600 mg by mouth as needed. Reported on 08/13/2015 08/10/2015: Pt has on hand but has not taken. Wife informed that this has same active ingredient as robitussin and not to use both together.  . HYDROcodone-acetaminophen (NORCO) 10-325 MG per tablet Take 1 tablet by mouth every 6 (six) hours as needed for moderate pain. for pain 12/01/2014: .   . ipratropium-albuterol (DUONEB) 0.5-2.5 (3) MG/3ML SOLN Take 3 mLs by nebulization every 6 (six) hours.   . metoprolol (LOPRESSOR) 50 MG tablet Take 1 tablet (50 mg total) by mouth 2 (two) times daily.   . montelukast (SINGULAIR) 10 MG tablet Take 1 tablet (10 mg total) by mouth at bedtime.   Marland Kitchen omeprazole (PRILOSEC) 40 MG capsule Take 40 mg by mouth 2 (two) times daily.    . polyethylene glycol (MIRALAX / GLYCOLAX) packet Take 17 g by mouth daily. For constipation   . primidone (MYSOLINE) 250 MG tablet TAKE 1 TABLET (250 MG TOTAL) BY MOUTH 2 (TWO) TIMES DAILY.   Marland Kitchen promethazine (PHENERGAN) 25 MG tablet Take 25 mg by mouth every 6 (six) hours as needed for nausea or vomiting. Reported on 04/06/2015 12/01/2014: .   . ranitidine (ZANTAC) 150 MG tablet Take 150-300 mg by mouth 2 (two) times daily as needed for heartburn.   . Zinc 50 MG TABS Take 50 mg by mouth  daily.    Marland Kitchen amoxicillin-clavulanate (AUGMENTIN) 875-125 MG tablet Take 1 tablet by mouth every 12 (twelve) hours. Patient and spouse indicate that they have these at home-complete course. (Patient not taking: Reported on 08/21/2015)   . guaiFENesin (ROBITUSSIN) 100 MG/5ML SOLN Take 5 mLs (100 mg total) by mouth every 4 (four) hours as needed for cough or to loosen phlegm. (Patient not taking: Reported on 08/21/2015)   . nitroGLYCERIN (NITROSTAT) 0.4 MG SL tablet PLACE 1 TABLET (0.4 MG TOTAL) UNDER THE TONGUE EVERY 5 (FIVE) MINUTES AS NEEDED FOR CHEST PAIN. (Patient not taking: Reported on 08/21/2015) 08/13/2015: Has at home if needed  . tiZANidine (ZANAFLEX) 4 MG tablet TAKE 1 TABLET BY MOUTH EVERY 8 HOURS AS NEEDED  FOR MUSCLE SPASMS. (Patient not taking: Reported on 08/21/2015) 08/13/2015: Has at home but has not taken since hospital discharge   No facility-administered encounter medications on file as of 08/21/2015.    Functional Status:   In your present state of health, do you have any difficulty performing the following activities: 08/21/2015 08/02/2015  Hearing? [name]  Vision? Y Y  Difficulty concentrating or making decisions? [name]  Walking or climbing stairs? Y Y  Dressing or bathing? Y Y  Doing errands, shopping? [name]  Preparing Food and eating ? Y -  Using the Toilet? Y -  In the past six months, have you accidently leaked urine? N -  Do you have problems with loss of bowel control? N -  Managing your Medications? Y -  Managing your Finances? N -  Housekeeping or managing your Housekeeping? N -    Fall/Depression Screening:    PHQ 2/9 Scores 08/21/2015 06/07/2015 04/26/2015 03/30/2015 03/02/2015 12/29/2014 12/11/2014  PHQ - 2 Score 3 0 _0 0  PHQ- 9 Score 15 - 17 - - - -   Fall Risk  08/21/2015 07/30/2015 06/07/2015 04/26/2015 03/30/2015  Falls in the past year? Yes Yes No Yes No  Number falls in past yr: 2 or more 2 or more - 2 or more -  Injury with Fall? Yes Yes - No -  Risk Factor Category   High Fall Risk High Fall Risk - High Fall Risk -  Risk for fall due to : History of fall(s) History of fall(s);Impaired balance/gait;Impaired mobility Impaired balance/gait;Impaired mobility;Medication side effect History of fall(s);Impaired balance/gait;Impaired mobility Impaired balance/gait;Impaired mobility  Risk for fall due to (comments): - - - - -  Follow up Falls prevention discussed Education provided;Falls evaluation completed;Falls prevention discussed - Falls prevention discussed -   Assessment:   (1) Reviewed purpose of [facility] transition of care program. Reviewed consent and patient denies any changes needed from the consent dated on 04/26/2015. Reviewed [facility] calendar and call a nurse line. Magnet is on the refrigerator. Provided my business card and contact information. (2) positive depression screening. (3) 2 recent falls. (4) active with [facility]. (5) recent admission due to aspiration pneumonia.On ensure supplements that are expensive. (6) pradaxa is expensive. Wife reports that she is hoping this is a temporary medication,  If it is not then she will needs some assistance due to monthly co pay of 150.00.   Plan:  (1) Encouraged wife to call if needed. Reviewed with patient and wife that I would be calling weekly for transition of care.  (2) will send depression screening to MD. Encouraged patient to be as active as possible. Patient feels like his depression screening is high because of his recent admission to the hospital. (3) reviewed importance of walking upright vs hunched over. Encouraged patient to try to work on this about this throws his balance off and may lead to future falls. Encouraged non slip socks, shoes, and schanging positions slowly.  Patient also has an appointment to get eyes checked next week due to double vision.  (4) encouraged patient to do home exercises as prescribed by therapist. (5) reviewed importance of sitting upright when eating or drinking.  Reviewed importance of purposeful swallowing. Encouraged patient to keep MRI appointment and follow ups. Will provide patient with ensure coupons to help off set cost of ensure. (6) At this time wife states she will let a [facility] team member know if she needs assistance with the  cost of patients pradaxa.   Next outreach planned for 08/28/2015 This note sent to primary MD.  Care planning and goal setting and primary goal at this time is to avoid readmissions.  [facility] CM Care Plan Problem One        Most Recent Value   Care Plan Problem One  Recent hospital admission for shortness of breath   Role Documenting the Problem One  Care Management [place] for Problem One  Active [malnutrition, aspiration pneumonia, swallowing problems.]   [facility] Long Term Goal (31-90 days)  Patient and or wife will report no readmissions in the next 31 days.   [facility] Long Term Goal Start Date  08/13/15   Interventions for Problem One Long Term Goal  Home visit completed. Encouraged patient to keep MD appointments and call MD for any concerns and changes in symptoms.   [facility] CM Short Term Goal #1 (0-30 days)  Patient will follow up with primary MD in 10 days   [facility] CM Short Term Goal #1 Start Date  08/13/15   [facility] CM Short Term Goal #1 Met Date  08/21/15   Interventions for Short Term Goal #1  Reviewed importance of timely follow up with primary MD. Encouraged wife to call for any transportation problems.    [facility] CM Short Term Goal #2 (0-30 days)  Patient will verbalize that he is weighing and recording weights daily for 30 days   [facility] CM Short Term Goal #2 Start Date  08/06/15   Interventions for Short Term Goal #2  reminded patient of importance of daily weights.   [facility] CM Short Term Goal #3 (0-30 days)  Patient will verbalize drinking atleast 1 ensure per day for the next 30 days.   [facility] CM Short Term Goal #3 Start Date  08/21/15   Interventions for Short Tern Goal #3  Ensure coupons provided. Reviewed importance of  good nutriton in healing and fighting off infections. reviewed ways to add calories and protein (like protein powder in smoothies or milkshakes.    [facility] CM Care Plan Problem Two        Most Recent Value   Care Plan Problem Two  Alteration in safety related to disease process as evidence by 2 falls since hospital discharge.   Role Documenting the Problem Two  Care Management Coordinator   [facility] CM Short Term Goal #1 (0-30 days)  Patient will report no falls in the next 21 days.   [facility] CM Short Term Goal #1 Start Date  08/21/15   Interventions for Short Term Goal #2   reviewed fall precautions, non slip socks, wearing shoes, reviewed importance of patient standing upright vs being humped over. reviewed importance of call MD after falls. discussed concerns for injuries while on blood thinners.     Tomasa Rand, RN, BSN, CEN [facility] [employer] [phone]

## 2015-08-22 ENCOUNTER — Encounter: Payer: Self-pay | Admitting: Adult Health

## 2015-08-22 ENCOUNTER — Ambulatory Visit (INDEPENDENT_AMBULATORY_CARE_PROVIDER_SITE_OTHER): Payer: Medicare Other | Admitting: Adult Health

## 2015-08-22 VITALS — BP 120/80 | HR 81 | Ht 70.0 in | Wt 154.0 lb

## 2015-08-22 DIAGNOSIS — I2510 Atherosclerotic heart disease of native coronary artery without angina pectoris: Secondary | ICD-10-CM | POA: Diagnosis not present

## 2015-08-22 DIAGNOSIS — I2584 Coronary atherosclerosis due to calcified coronary lesion: Secondary | ICD-10-CM

## 2015-08-22 DIAGNOSIS — J690 Pneumonitis due to inhalation of food and vomit: Secondary | ICD-10-CM | POA: Diagnosis not present

## 2015-08-22 DIAGNOSIS — J479 Bronchiectasis, uncomplicated: Secondary | ICD-10-CM | POA: Diagnosis not present

## 2015-08-22 DIAGNOSIS — J9610 Chronic respiratory failure, unspecified whether with hypoxia or hypercapnia: Secondary | ICD-10-CM

## 2015-08-22 DIAGNOSIS — J439 Emphysema, unspecified: Secondary | ICD-10-CM

## 2015-08-22 DIAGNOSIS — J9620 Acute and chronic respiratory failure, unspecified whether with hypoxia or hypercapnia: Secondary | ICD-10-CM | POA: Diagnosis not present

## 2015-08-22 DIAGNOSIS — I4892 Unspecified atrial flutter: Secondary | ICD-10-CM | POA: Diagnosis not present

## 2015-08-22 NOTE — Progress Notes (Signed)
Reviewed, difficult situation.  Glad he is getting the vest tomorrow.  Needs continued f/u with neurology for weakness.

## 2015-08-22 NOTE — Progress Notes (Signed)
Chief Complaint  Patient presents with  . Follow-up    has some good days and some bad days, general weakness, wife states patient is so weak she had trouble getting him into the car.  Has not received the vest yet, it will come tomorrow.  Wants to get prescription for pulse oximeter.     Tests [name] 04/2009: FEV1 1.65 (47%), FEV1% 49  CT chest 10/2013: RLL infiltrates   Past medical hx Past Medical History  Diagnosis Date  . Hyperlipidemia     takes Simvastatin daily  . Tremor   . History of prostate cancer 2004  . On home O2   . Neuromuscular disorder ([place]) 1998    right carpal tunnel release  . Anemia associated with acute blood loss   . GERD (gastroesophageal reflux disease)     takes Omeprazole daily  . Hypertension     takes Metoprolol daily as well as Hyzaar  . Constipation     takes Colace daily as well as Miralax  . Anxiety     takes Valium daily  . Depression     takes Cymbalta daily  . Emphysema of lung ([facility])     Albuterol as needed;Symbicort daily and Singulair at bedtime  . Myocardial infarction ([place]) 04/1998  . Coronary artery disease   . Asthma   . Shortness of breath     with exertion  . History of bronchitis   . Aspiration pneumonia ([place]) 2010  . Headache, chronic daily   . History of migraine     last migraine a couple of days ago;takes Excedrin Migraine  . Chronic back pain     compression fracture  . History of colon polyps   . Mood change ([place])     after Brain surgery mood changed and was placed on Depakote  . Insomnia     takes Benadryl nightly  . Stroke 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 Lung cancer ([place]) 2016  . Angioedema 10/31/2010  . Bacteremia due to Pseudomonas 10/04/2011    D/t gram neg rods   . 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  Current Outpatient Prescriptions on File Prior to Visit  Medication Sig  . ARIPiprazole  (ABILIFY) 10 MG tablet Take 5 mg by mouth daily.   . Ascorbic Acid (VITAMIN C) 1000 MG tablet Take 1,000 mg by mouth daily.  Marland Kitchen atorvastatin (LIPITOR) 20 MG tablet Take 1 tablet (20 mg total) by mouth daily at 6 PM.  . budesonide (PULMICORT) 0.25 MG/2ML nebulizer solution Take 4 mLs (0.5 mg total) by nebulization 2 (two) times daily.  . cholecalciferol (VITAMIN D) 1000 UNITS tablet Take 1,000 Units by mouth every morning.   . dabigatran (PRADAXA) 150 MG CAPS capsule Take 1 capsule (150 mg total) by mouth every 12 (twelve) hours.  . diazepam (VALIUM) 5 MG tablet Take 1 tablet (5 mg total) by mouth every 8 (eight) hours as needed for anxiety or muscle spasms. scheduled  . diltiazem (CARDIZEM CD) 240 MG 24 hr capsule Take 1 capsule (240 mg total) by mouth daily.  . divalproex (DEPAKOTE) 500 MG 24 hr tablet Take 1,000 mg by mouth at bedtime.   . docusate sodium (COLACE) 100 MG capsule Take 200 mg by mouth at bedtime.   . DULoxetine (CYMBALTA) 60 MG capsule Take 120 mg by mouth daily.   Marland Kitchen  feeding supplement, ENSURE ENLIVE, (ENSURE ENLIVE) LIQD Take 237 mLs by mouth 2 (two) times daily between meals.  . fluticasone (FLONASE) 50 MCG/ACT nasal spray PLACE 2 SPRAYS INTO BOTH NOSTRILS DAILY. (Patient taking differently: PLACE 2 SPRAYS INTO BOTH NOSTRILS DAILY as needed for allergies)  . guaiFENesin (MUCINEX) 600 MG 12 hr tablet Take 600 mg by mouth as needed. Reported on 08/13/2015  . guaiFENesin (ROBITUSSIN) 100 MG/5ML SOLN Take 5 mLs (100 mg total) by mouth every 4 (four) hours as needed for cough or to loosen phlegm.  Marland Kitchen HYDROcodone-acetaminophen (NORCO) 10-325 MG per tablet Take 1 tablet by mouth every 6 (six) hours as needed for moderate pain. for pain  . ipratropium-albuterol (DUONEB) 0.5-2.5 (3) MG/3ML SOLN Take 3 mLs by nebulization every 6 (six) hours.  . metoprolol (LOPRESSOR) 50 MG tablet Take 1 tablet (50 mg total) by mouth 2 (two) times daily.  . montelukast (SINGULAIR) 10 MG tablet Take 1 tablet  (10 mg total) by mouth at bedtime.  . nitroGLYCERIN (NITROSTAT) 0.4 MG SL tablet PLACE 1 TABLET (0.4 MG TOTAL) UNDER THE TONGUE EVERY 5 (FIVE) MINUTES AS NEEDED FOR CHEST PAIN.  Marland Kitchen omeprazole (PRILOSEC) 40 MG capsule Take 40 mg by mouth 2 (two) times daily.   . polyethylene glycol (MIRALAX / GLYCOLAX) packet Take 17 g by mouth daily. For constipation  . primidone (MYSOLINE) 250 MG tablet TAKE 1 TABLET (250 MG TOTAL) BY MOUTH 2 (TWO) TIMES DAILY.  Marland Kitchen promethazine (PHENERGAN) 25 MG tablet Take 25 mg by mouth every 6 (six) hours as needed for nausea or vomiting. Reported on 04/06/2015  . ranitidine (ZANTAC) 150 MG tablet Take 150-300 mg by mouth 2 (two) times daily as needed for heartburn.  Marland Kitchen tiZANidine (ZANAFLEX) 4 MG tablet TAKE 1 TABLET BY MOUTH EVERY 8 HOURS AS NEEDED FOR MUSCLE SPASMS.  Marland Kitchen Zinc 50 MG TABS Take 50 mg by mouth daily.    No current facility-administered medications on file prior to visit.     Vital Signs BP 120/80 mmHg  Pulse 81  Ht '5\' 10"'$  (1.778 m)  Wt 154 lb (69.854 kg)  BMI 22.10 kg/m2  SpO2 90%  History of Present Illness Malik Zuniga is a 70 y.o. male with hx COPD, RLL adenocarcinoma (treated with radiation 2016), recurrent aspiration PNA and bronchiectasis in the RLL, followed by Dr. [name].  [facility] after being sent from the pulmonary office with acute respiratory failure r/t aspiration v acute exacerbation of bronchiectasis c/b acute on chronic diastolic CHF, hemoptysis, sepsis. He required intubation and 24 hours of mechanical ventilation during this admission.  He improved with IV steroids, IV abx, BD's and was extubated 6/24.  Course was c/b Aflutter with RVR, delirium.    He presented today for hospital f/u feeling much better.  Still extremely weak, no appetite.  No hemoptysis, cough, dyspnea, chest pain.  Working with PT and speech.    Awaiting chest vest. Would like portable pulse oximeter.    Physical  Exam  General - frail, chronically ill appearing male, NAD in wheelchair.   ENT - No sinus tenderness, no oral exudate, no LAN Cardiac - s1s2 regular, no murmur Chest - resps even non labored on RA, diminished bases, otherwise cta  Back - No focal tenderness Abd - Soft, non-tender Ext - No edema Neuro - Normal strength Skin - No rashes Psych - normal mood, and behavior   Assessment/Plan  Acute on chronic respiratory failure - Resolved. Multifactorial  r/t acute exacerbation of bronchiectasis v aspiration complicated by acute on chronic diastolic CHF.  Bronchiectasis  PLAN -  Chest vest ordered  Will order pulse ox  PT/OT   Recurrent aspiration  PLAN -  Followed closely by PCP  Dysphagia diet  Consider MRI brain/CSpine  myesthenia abs sent - in process   Hemoptysis - resolved.  PLAN -  Monitor on pradaxa   COPD - no evidence AE  PLAN -  Continue pulmicort, duonebs    Patient Instructions  Glad you are feeling a little better!  Keep working with therapy  We will order you a pulse oximeter  Follow up with Dr. [name] in 1 month  Please contact office for sooner follow up if symptoms do not improve or worsen or seek emergency care      Nickolas Madrid, NP 08/22/2015  11:31 AM

## 2015-08-22 NOTE — Patient Instructions (Signed)
Glad you are feeling a little better!  Keep working with therapy  We will order you a pulse oximeter  Follow up with Dr. [name] in 1 month  Please contact office for sooner follow up if symptoms do not improve or worsen or seek emergency care

## 2015-08-23 DIAGNOSIS — J9620 Acute and chronic respiratory failure, unspecified whether with hypoxia or hypercapnia: Secondary | ICD-10-CM | POA: Diagnosis not present

## 2015-08-23 DIAGNOSIS — I4892 Unspecified atrial flutter: Secondary | ICD-10-CM | POA: Diagnosis not present

## 2015-08-24 ENCOUNTER — Ambulatory Visit: Payer: Medicare Other | Admitting: Cardiology

## 2015-08-24 DIAGNOSIS — I4892 Unspecified atrial flutter: Secondary | ICD-10-CM | POA: Diagnosis not present

## 2015-08-24 DIAGNOSIS — J9620 Acute and chronic respiratory failure, unspecified whether with hypoxia or hypercapnia: Secondary | ICD-10-CM | POA: Diagnosis not present

## 2015-08-24 LAB — ACETYLCHOLINE RECEPTOR AB, ALL
Acetylchol Block Ab: 24 % (ref 0–25)
Acetylcholine Modulat Ab: <12 % (ref 0–20)

## 2015-08-27 ENCOUNTER — Inpatient Hospital Stay: Payer: Medicare Other | Admitting: Pulmonary Disease

## 2015-08-27 ENCOUNTER — Ambulatory Visit: Payer: Medicare Other | Admitting: *Deleted

## 2015-08-27 DIAGNOSIS — I4892 Unspecified atrial flutter: Secondary | ICD-10-CM | POA: Diagnosis not present

## 2015-08-27 DIAGNOSIS — J9620 Acute and chronic respiratory failure, unspecified whether with hypoxia or hypercapnia: Secondary | ICD-10-CM | POA: Diagnosis not present

## 2015-08-28 ENCOUNTER — Other Ambulatory Visit: Payer: Self-pay

## 2015-08-28 DIAGNOSIS — J9620 Acute and chronic respiratory failure, unspecified whether with hypoxia or hypercapnia: Secondary | ICD-10-CM | POA: Diagnosis not present

## 2015-08-28 DIAGNOSIS — I4892 Unspecified atrial flutter: Secondary | ICD-10-CM | POA: Diagnosis not present

## 2015-08-28 NOTE — Patient Outreach (Signed)
Transition of care call: Placed call to patient and wife answered. States they are on the way to the dentist. States that patient is " a little better"  Reports that patient remains extremely weak. States that he saw pulmonary last week but had to cancel cardiology due to weakness. Wife reports that patient is eating some better. Has added protein powder to ice cream.  Wife reports ramp is currently being built to assist with mobility.   COPD: Wife states that patient has gotten his percussion vest and is using vest twice a day for 30 minutes. Reports that patient is able to cough up secretions after using vest.   Wife denies any new problems or concerns today.  States weight is stable. Has gain 1 pound.  PLAN: will continue weekly transition of care calls.  Tomasa Rand, RN, BSN, CEN [facility] [employer] [phone]

## 2015-08-29 ENCOUNTER — Telehealth: Payer: Self-pay | Admitting: Family Medicine

## 2015-08-29 DIAGNOSIS — H2513 Age-related nuclear cataract, bilateral: Secondary | ICD-10-CM | POA: Diagnosis not present

## 2015-08-29 DIAGNOSIS — I4892 Unspecified atrial flutter: Secondary | ICD-10-CM | POA: Diagnosis not present

## 2015-08-29 DIAGNOSIS — J9620 Acute and chronic respiratory failure, unspecified whether with hypoxia or hypercapnia: Secondary | ICD-10-CM | POA: Diagnosis not present

## 2015-08-29 NOTE — Telephone Encounter (Signed)
Caller with [facility] asking for a verbal order to extend speech therapy for 2 x a wk for 3 wks.

## 2015-08-29 NOTE — Telephone Encounter (Signed)
Verbal ok given to extend therapy.

## 2015-08-31 DIAGNOSIS — J9620 Acute and chronic respiratory failure, unspecified whether with hypoxia or hypercapnia: Secondary | ICD-10-CM | POA: Diagnosis not present

## 2015-08-31 DIAGNOSIS — I4892 Unspecified atrial flutter: Secondary | ICD-10-CM | POA: Diagnosis not present

## 2015-09-03 DIAGNOSIS — I4892 Unspecified atrial flutter: Secondary | ICD-10-CM | POA: Diagnosis not present

## 2015-09-03 DIAGNOSIS — F331 Major depressive disorder, recurrent, moderate: Secondary | ICD-10-CM | POA: Diagnosis not present

## 2015-09-03 DIAGNOSIS — J9620 Acute and chronic respiratory failure, unspecified whether with hypoxia or hypercapnia: Secondary | ICD-10-CM | POA: Diagnosis not present

## 2015-09-03 NOTE — Assessment & Plan Note (Signed)
Ongoing issue.  Pt completed his course of Augmentin.  Now on dysphagia I diet.  Pt has speech tx arranged.  Will follow along and assist as able.

## 2015-09-03 NOTE — Assessment & Plan Note (Signed)
Pt reports feeling better from recent hospitalization, just extremely tired.  Has HH nursing, PT/OT, speech ordered.  Completed his Augmentin yesterday.  Due for repeat CBC.  Pt continues to follow w/ pulmonary.  Will follow along and assist as able.

## 2015-09-03 NOTE — Assessment & Plan Note (Signed)
Chronic problem.  Pt was started on Dilt and Metoprolol due to Aflutter.  ARB was stopped.  BP remains well controlled.  Asymptomatic at this time w/ exception of fatigue.  Check BMP as recommended in d/c summary.  Will continue to follow.

## 2015-09-04 ENCOUNTER — Other Ambulatory Visit: Payer: Self-pay

## 2015-09-04 DIAGNOSIS — I4892 Unspecified atrial flutter: Secondary | ICD-10-CM | POA: Diagnosis not present

## 2015-09-04 DIAGNOSIS — J9620 Acute and chronic respiratory failure, unspecified whether with hypoxia or hypercapnia: Secondary | ICD-10-CM | POA: Diagnosis not present

## 2015-09-04 NOTE — Patient Outreach (Signed)
Transition of care call: Placed call to patient and spoke with wife. Wife reports that patient is making progress. States ramp was built for their home. States that patients current weight of 153. Reports that patient is drinking his ensure.  Denies any new problems or concerns.   Plan: Will continue weekly outreach attempts for transition of care.  Tomasa Rand, RN, BSN, CEN [facility] [employer] [phone]

## 2015-09-05 DIAGNOSIS — J9620 Acute and chronic respiratory failure, unspecified whether with hypoxia or hypercapnia: Secondary | ICD-10-CM | POA: Diagnosis not present

## 2015-09-05 DIAGNOSIS — I4892 Unspecified atrial flutter: Secondary | ICD-10-CM | POA: Diagnosis not present

## 2015-09-06 DIAGNOSIS — J9620 Acute and chronic respiratory failure, unspecified whether with hypoxia or hypercapnia: Secondary | ICD-10-CM | POA: Diagnosis not present

## 2015-09-06 DIAGNOSIS — I4892 Unspecified atrial flutter: Secondary | ICD-10-CM | POA: Diagnosis not present

## 2015-09-07 ENCOUNTER — Ambulatory Visit
Admission: RE | Admit: 2015-09-07 | Discharge: 2015-09-07 | Disposition: A | Discharge status: Home / Self Care | Payer: Medicare Other | Source: Ambulatory Visit | Attending: Neurology | Admitting: Neurology

## 2015-09-07 ENCOUNTER — Telehealth: Payer: Self-pay | Admitting: Interventional Cardiology

## 2015-09-07 ENCOUNTER — Telehealth: Payer: Self-pay | Admitting: Neurology

## 2015-09-07 DIAGNOSIS — R29898 Other symptoms and signs involving the musculoskeletal system: Secondary | ICD-10-CM

## 2015-09-07 DIAGNOSIS — R202 Paresthesia of skin: Secondary | ICD-10-CM | POA: Diagnosis not present

## 2015-09-07 DIAGNOSIS — R251 Tremor, unspecified: Secondary | ICD-10-CM

## 2015-09-07 DIAGNOSIS — M50222 Other cervical disc displacement at C5-C6 level: Secondary | ICD-10-CM | POA: Diagnosis not present

## 2015-09-07 DIAGNOSIS — R1310 Dysphagia, unspecified: Secondary | ICD-10-CM

## 2015-09-07 DIAGNOSIS — M50221 Other cervical disc displacement at C4-C5 level: Secondary | ICD-10-CM | POA: Diagnosis not present

## 2015-09-07 NOTE — Telephone Encounter (Signed)
Patient and wife made aware of results. They would like to hold on referral until they see Dr. Carles Collet next Friday.

## 2015-09-07 NOTE — Telephone Encounter (Signed)
Spoke with patients wife. The pt was started on Pradaxa after his last  hospitalization at [facility]. They have had a hard time affording the medication, they were told the pt would only need to be on the medication short term. The pt is down to 1 pill.   Adv her that I will leave samples of Pradaxa at the front desk for pick up, he should continue taking Pradaxa. Pt has an appt on 8/1 with Chase Picket, PA-C they can discuss possible med changes at that appt.  Patients wife voiced appreciation and will come to the office this afternoon to pick up samples

## 2015-09-07 NOTE — Telephone Encounter (Signed)
Left message on machine for patient to call back.

## 2015-09-07 NOTE — Telephone Encounter (Signed)
New message     Pt wife wants to know about the pradaxa. Can her husband come off of it? Please call.

## 2015-09-07 NOTE — Telephone Encounter (Signed)
-----   Message from Cameron Sprang, MD sent at 09/07/2015  1:11 PM EDT ----- Pls let patient know MRI brain does not show any new changes, no evidence of tumor, stroke, or bleed. His C-spine MRI mainly showed arthritis changes. If a lot of neck pain, could offer referral to Ortho. Thanks

## 2015-09-10 DIAGNOSIS — J9620 Acute and chronic respiratory failure, unspecified whether with hypoxia or hypercapnia: Secondary | ICD-10-CM | POA: Diagnosis not present

## 2015-09-10 DIAGNOSIS — I4892 Unspecified atrial flutter: Secondary | ICD-10-CM | POA: Diagnosis not present

## 2015-09-11 ENCOUNTER — Ambulatory Visit (INDEPENDENT_AMBULATORY_CARE_PROVIDER_SITE_OTHER): Payer: Medicare Other | Admitting: Cardiology

## 2015-09-11 ENCOUNTER — Encounter: Payer: Self-pay | Admitting: Cardiology

## 2015-09-11 VITALS — BP 110/62 | HR 75 | Ht 70.0 in | Wt 158.8 lb

## 2015-09-11 DIAGNOSIS — I5042 Chronic combined systolic (congestive) and diastolic (congestive) heart failure: Secondary | ICD-10-CM

## 2015-09-11 DIAGNOSIS — I2510 Atherosclerotic heart disease of native coronary artery without angina pectoris: Secondary | ICD-10-CM | POA: Diagnosis not present

## 2015-09-11 DIAGNOSIS — I4892 Unspecified atrial flutter: Secondary | ICD-10-CM | POA: Diagnosis not present

## 2015-09-11 DIAGNOSIS — J9620 Acute and chronic respiratory failure, unspecified whether with hypoxia or hypercapnia: Secondary | ICD-10-CM | POA: Diagnosis not present

## 2015-09-11 DIAGNOSIS — I2584 Coronary atherosclerosis due to calcified coronary lesion: Secondary | ICD-10-CM

## 2015-09-11 MED ORDER — DABIGATRAN ETEXILATE MESYLATE 150 MG PO CAPS: 150.0000 mg | ORAL_CAPSULE | Freq: Two times a day (BID) | ORAL | 6 refills | Status: DC

## 2015-09-11 MED ORDER — METOPROLOL TARTRATE 50 MG PO TABS: 50.0000 mg | ORAL_TABLET | Freq: Two times a day (BID) | ORAL | 1 refills | Status: DC

## 2015-09-11 MED ORDER — ATORVASTATIN CALCIUM 20 MG PO TABS: 20.0000 mg | ORAL_TABLET | Freq: Every day | ORAL | 6 refills | Status: DC

## 2015-09-11 MED ORDER — DILTIAZEM HCL ER COATED BEADS 240 MG PO CP24: 240.0000 mg | ORAL_CAPSULE | Freq: Every day | ORAL | 6 refills | Status: DC

## 2015-09-11 NOTE — Progress Notes (Signed)
09/11/2015 Malik Zuniga   1945/05/26  [id number]  Primary Physician [name], MD Primary Cardiologist: Dr. 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 Patient has history of mild LV dysfunction with combined systolic and diastolic heart failure. He underwent a nuclear medicine perfusion scan in May of this year that showed no significant evidence of ischemia. He is followed by Dr. Tamala Julian.   He was recently admitted to [facility] for acute respiratory failure in the setting of COPD and acute on chronic diastolic CHF. He was admitted by internal medicine. Cardiology was consulted as the patient was also noted to be in new onset atrial flutter with a rapid ventricular response. He was treated with IV diltiazem and spontaneously converted to normal sinus rhythm. He was transitioned to PO diltiazem and  Metroprolol. He was placed on Pradaxa for anticoagulation given a CHA2DS2 VASc score of 5. 2-D echocardiogram was performed 08/06/2015 which showed an EF of 50-55%. There were no regional wall motion abnormalities.  He presents to clinic today for post hospital follow-up. He reports that he has done well. EKG shows normal sinus rhythm. Heart rate is 75 bpm. He denies any symptoms of breakthrough atrial flutter. He denies palpitations. No dyspnea. No chest discomfort. No lightheadedness or dizziness. He's been fully compliant with his medications. He has been tolerating these medications well. He denies any abnormal bleeding with Pradaxa.    Current Outpatient Prescriptions  Medication Sig Dispense Refill  . ARIPiprazole (ABILIFY) 10 MG tablet Take 5 mg by mouth daily.     . Ascorbic Acid (VITAMIN C) 1000 MG tablet Take 1,000 mg  by mouth daily.    Marland Kitchen atorvastatin (LIPITOR) 20 MG tablet Take 1 tablet (20 mg total) by mouth daily at 6 PM. 30 tablet 0  . budesonide (PULMICORT) 0.25 MG/2ML nebulizer solution Take 4 mLs (0.5 mg total) by nebulization 2 (two) times daily. 60 mL 3  . cholecalciferol (VITAMIN D) 1000 UNITS tablet Take 1,000 Units by mouth every morning.     . dabigatran (PRADAXA) 150 MG CAPS capsule Take 1 capsule (150 mg total) by mouth every 12 (twelve) hours. 60 capsule 0  . diazepam (VALIUM) 5 MG tablet Take 1 tablet (5 mg total) by mouth every 8 (eight) hours as needed for anxiety or muscle spasms. scheduled 30 tablet 0  . diltiazem (CARDIZEM CD) 240 MG 24 hr capsule Take 1 capsule (240 mg total) by mouth daily. 30 capsule 0  . divalproex (DEPAKOTE) 500 MG 24 hr tablet Take 1,000 mg by mouth at bedtime.     . docusate sodium (COLACE) 100 MG capsule Take 200 mg by mouth at bedtime.     . DULoxetine (CYMBALTA) 60 MG capsule Take 120 mg by mouth daily.     . feeding supplement, ENSURE ENLIVE, (ENSURE ENLIVE) LIQD Take 237 mLs by mouth 2 (two) times daily between meals. 237 mL 12  . fluticasone (FLONASE) 50 MCG/ACT nasal spray PLACE 2 SPRAYS INTO BOTH NOSTRILS DAILY. (Patient taking differently: PLACE 2 SPRAYS INTO BOTH NOSTRILS DAILY as needed for allergies) 16 g 2  . guaiFENesin (MUCINEX) 600 MG 12 hr tablet Take 600 mg by mouth as needed. Reported on 08/13/2015    . guaiFENesin (ROBITUSSIN) 100 MG/5ML SOLN Take  5 mLs (100 mg total) by mouth every 4 (four) hours as needed for cough or to loosen phlegm. 236 mL 0  . HYDROcodone-acetaminophen (NORCO) 10-325 MG per tablet Take 1 tablet by mouth every 6 (six) hours as needed for moderate pain. for pain  0  . ipratropium-albuterol (DUONEB) 0.5-2.5 (3) MG/3ML SOLN Take 3 mLs by nebulization every 6 (six) hours. 360 mL 3  . metoprolol (LOPRESSOR) 50 MG tablet Take 1 tablet (50 mg total) by mouth 2 (two) times daily. 60 tablet 0  . montelukast (SINGULAIR) 10 MG tablet Take  1 tablet (10 mg total) by mouth at bedtime. 90 tablet 1  . nitroGLYCERIN (NITROSTAT) 0.4 MG SL tablet PLACE 1 TABLET (0.4 MG TOTAL) UNDER THE TONGUE EVERY 5 (FIVE) MINUTES AS NEEDED FOR CHEST PAIN. 25 tablet 2  . omeprazole (PRILOSEC) 40 MG capsule Take 40 mg by mouth 2 (two) times daily.     . polyethylene glycol (MIRALAX / GLYCOLAX) packet Take 17 g by mouth daily. For constipation    . primidone (MYSOLINE) 250 MG tablet TAKE 1 TABLET (250 MG TOTAL) BY MOUTH 2 (TWO) TIMES DAILY. 180 tablet 1  . promethazine (PHENERGAN) 25 MG tablet Take 25 mg by mouth every 6 (six) hours as needed for nausea or vomiting. Reported on 04/06/2015  1  . ranitidine (ZANTAC) 150 MG tablet Take 150-300 mg by mouth 2 (two) times daily as needed for heartburn.    Marland Kitchen tiZANidine (ZANAFLEX) 4 MG tablet TAKE 1 TABLET BY MOUTH EVERY 8 HOURS AS NEEDED FOR MUSCLE SPASMS. 90 tablet 1  . Zinc 50 MG TABS Take 50 mg by mouth daily.      No current facility-administered medications for this visit.     Allergies  Allergen Reactions  . Lisinopril Swelling    ANGIOEDEMA  . Lamictal [Lamotrigine] Other (See Comments)    Weakness/difficulty swallowing  . Ambien [Zolpidem] Other (See Comments)    Unknown reaction    Social History   Social History  . Marital status: Married    Spouse name: N/A  . Number of children: N/A  . Years of education: N/A   Occupational History  . Not on file.   Social History Main Topics  . Smoking status: Former Smoker    Packs/day: 1.50    Years: 52.00    Types: Cigarettes    Quit date: 07/17/2012  . Smokeless tobacco: Never Used     Comment: Former smoker  . Alcohol use No  . Drug use:     Types: Mescaline  . Sexual activity: Yes   Other Topics Concern  . Not on file   Social History Narrative  . No narrative on file     Review of Systems: General: negative for chills, fever, night sweats or weight changes.  Cardiovascular: negative for chest pain, dyspnea on exertion, edema,  orthopnea, palpitations, paroxysmal nocturnal dyspnea or shortness of breath Dermatological: negative for rash Respiratory: negative for cough or wheezing Urologic: negative for hematuria Abdominal: negative for nausea, vomiting, diarrhea, bright red blood per rectum, melena, or hematemesis Neurologic: negative for visual changes, syncope, or dizziness All other systems reviewed and are otherwise negative except as noted above.    Blood pressure 110/62, pulse 75, height '5\' 10"'$  (1.778 m), weight 158 lb 12.8 oz (72 kg).  General appearance: alert, cooperative and no distress Neck: no carotid bruit and no JVD Lungs: clear to auscultation bilaterally Heart: regular rate and rhythm, S1, S2 normal, no murmur, click, rub  or gallop Extremities: no LEE Pulses: 2+ and symmetric Skin: warm and dry Neurologic: Grossly normal  EKG NSR. HR 75 bpm  ASSESSMENT AND PLAN:   1. Paroxsymal atrial flutter: Patient was recently noted to be in new onset atrial flutter with RVR in the setting of acute on chronic respiratory failure secondary to COPD exacerbation. He spontaneously converted with IV Cardizem. EKG today shows that he is maintaining normal sinus rhythm. He denies any breakthrough symptoms. Heart rate is well-controlled with PO Cardizem and Metroprolol. He has a CHA2DS2 VASc score of 5. He is on anticoagulation therapy with Pradaxa. He is tolerating this well without any abnormal bleeding.  PLAN  F/u with Dr. Tamala Julian in 6-8 weeks.   Lyda Jester PA-C 09/11/2015 10:47 AM

## 2015-09-11 NOTE — Patient Instructions (Addendum)
Medication Instructions:  Your physician recommends that you continue on your current medications as directed. Please refer to the Current Medication list given to you today.   Labwork: None ordered  Testing/Procedures: None ordered  Follow-Up: Your physician recommends that you schedule a follow-up appointment in: [place]   Any Other Special Instructions Will Be Listed Below (If Applicable).     If you need a refill on your cardiac medications before your next appointment, please call your pharmacy.

## 2015-09-12 DIAGNOSIS — I4892 Unspecified atrial flutter: Secondary | ICD-10-CM | POA: Diagnosis not present

## 2015-09-12 DIAGNOSIS — J9620 Acute and chronic respiratory failure, unspecified whether with hypoxia or hypercapnia: Secondary | ICD-10-CM | POA: Diagnosis not present

## 2015-09-13 ENCOUNTER — Other Ambulatory Visit: Payer: Self-pay

## 2015-09-13 ENCOUNTER — Telehealth: Payer: Self-pay | Admitting: Pulmonary Disease

## 2015-09-13 VITALS — Wt 157.6 lb

## 2015-09-13 DIAGNOSIS — J690 Pneumonitis due to inhalation of food and vomit: Secondary | ICD-10-CM

## 2015-09-13 DIAGNOSIS — I4892 Unspecified atrial flutter: Secondary | ICD-10-CM | POA: Diagnosis not present

## 2015-09-13 DIAGNOSIS — J9620 Acute and chronic respiratory failure, unspecified whether with hypoxia or hypercapnia: Secondary | ICD-10-CM | POA: Diagnosis not present

## 2015-09-13 MED ORDER — PREDNISONE 20 MG PO TABS: 20.0000 mg | ORAL_TABLET | Freq: Every day | ORAL | 0 refills | Status: DC

## 2015-09-13 MED ORDER — LEVOFLOXACIN 750 MG PO TABS: 750.0000 mg | ORAL_TABLET | Freq: Every day | ORAL | 0 refills | Status: DC

## 2015-09-13 NOTE — Telephone Encounter (Signed)
Levaquin '750mg'$  po daily x 5 days perdnisone '20mg'$  daily x 5 days Call if no improvement

## 2015-09-13 NOTE — Patient Outreach (Signed)
Transition of care:   Wife returned voice mail. States that patient is doing very well. States that he is getting stronger. Reports that patient is gain weight and eating better. Reports that patient is using the pulmonary vest and feels like this has made a huge difference.  Wife reports that [place] and physical therapy have finished with services but patient remains active with speech therapy.  Wife reports that wheel chair ramps has assisted with mobility and that patient will get a wheel chair next week.   Wife denies any shortness of breath, no swelling.   Reviewed goals of care and completion of transition of care program. Wife would like to be referred back to health coach Daiva Nakayama. Will place health coach order as patient has successfully completed transition of care program and denies needs for future home visits.  Will send an update to MD. [facility] CM Care Plan Problem One   Flowsheet Row Most Recent Value  Care Plan Problem One  Recent hospital admission for shortness of breath  Role Documenting the Problem One  Care Management [place] for Problem One  Active [malnutrition, aspiration pneumonia, swallowing problems.]  [facility]N Long Term Goal (31-90 days)  Patient and or wife will report no readmissions in the next 31 days.  [facility]N Long Term Goal Start Date  08/13/15  [facility]N Long Term Goal Met Date  09/13/15  Interventions for Problem One Long Term Goal  Reviewed importance of good nutrition and call MD for concerns.  [facility]N CM Short Term Goal #1 (0-30 days)  Patient will follow up with primary MD in 10 days  [facility]N CM Short Term Goal #1 Start Date  08/13/15  [facility] CM Short Term Goal #1 Met Date  08/21/15  Interventions for Short Term Goal #1  Reviewed importance of timely follow up with primary MD. Encouraged wife to call for any transportation problems.   [facility]N CM Short Term Goal #2 (0-30 days)  Patient will verbalize that he is weighing and recording weights daily for 30  days  [facility]N CM Short Term Goal #2 Start Date  08/06/15  [facility] CM Short Term Goal #2 Met Date  09/13/15  Interventions for Short Term Goal #2  Reminded patient to continue to weigh daily  THN CM Short Term Goal #3 (0-30 days)  Patient will verbalize drinking atleast 1 ensure per day for the next 30 days.  THN CM Short Term Goal #3 Start Date  08/21/15  Interventions for Short Tern Goal #3  Encouraged patient to continue to drink daily suppplements.    [facility] CM Care Plan Problem Two   Flowsheet Row Most Recent Value  Care Plan Problem Two  Alteration in safety related to disease process as evidence by 2 falls since hospital discharge.  Role Documenting the Problem Two  Care Management Coordinator  THN CM Short Term Goal #1 (0-30 days)  Patient will report no falls in the next 21 days.  [facility]N CM Short Term Goal #1 Start Date  08/21/15  Interventions for Short Term Goal #2   Ramp built, waiting on wheelchair arrival. encouraged safe transfers.  Order place for health coach.     Tomasa Rand, RN, BSN, CEN [facility] [employer] [phone]

## 2015-09-13 NOTE — Telephone Encounter (Signed)
Spoke with the pt  He is c/o cough with minimal brown sputum  This just started today, along with chills but no fever  He states his breathing is at baseline  He denies CP or tightness and no wheezing  I offered appt, but he declined, stating "it's not that bad yet" Will forward to BQ for recs Please advise, thanks! Allergies  Allergen Reactions  . Lisinopril Swelling    ANGIOEDEMA  . Lamictal [Lamotrigine] Other (See Comments)    Weakness/difficulty swallowing  . Ambien [Zolpidem] Other (See Comments)    Unknown reaction
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
## 2015-09-13 NOTE — Patient Outreach (Signed)
Transition of care call: Placed call to patient. No answer. Left a message requesting a call back.  Tomasa Rand, RN, BSN, CEN [facility] [employer] [phone]

## 2015-09-13 NOTE — Telephone Encounter (Signed)
Spoke with the pt and notified of recs per BQ  Rxs were sent to pharm and he will call if not improving

## 2015-09-13 NOTE — Addendum Note (Signed)
Addended by: Thana Ates on: 09/13/2015 03:33 PM   Modules accepted: Orders

## 2015-09-14 ENCOUNTER — Ambulatory Visit (INDEPENDENT_AMBULATORY_CARE_PROVIDER_SITE_OTHER): Payer: Medicare Other | Admitting: Neurology

## 2015-09-14 ENCOUNTER — Encounter: Payer: Self-pay | Admitting: Neurology

## 2015-09-14 ENCOUNTER — Other Ambulatory Visit: Payer: Self-pay | Admitting: Family Medicine

## 2015-09-14 VITALS — BP 120/60 | HR 104

## 2015-09-14 DIAGNOSIS — J9620 Acute and chronic respiratory failure, unspecified whether with hypoxia or hypercapnia: Secondary | ICD-10-CM | POA: Diagnosis not present

## 2015-09-14 DIAGNOSIS — I2510 Atherosclerotic heart disease of native coronary artery without angina pectoris: Secondary | ICD-10-CM | POA: Diagnosis not present

## 2015-09-14 DIAGNOSIS — I2584 Coronary atherosclerosis due to calcified coronary lesion: Secondary | ICD-10-CM | POA: Diagnosis not present

## 2015-09-14 DIAGNOSIS — M47812 Spondylosis without myelopathy or radiculopathy, cervical region: Secondary | ICD-10-CM | POA: Diagnosis not present

## 2015-09-14 DIAGNOSIS — I4892 Unspecified atrial flutter: Secondary | ICD-10-CM | POA: Diagnosis not present

## 2015-09-14 DIAGNOSIS — J690 Pneumonitis due to inhalation of food and vomit: Secondary | ICD-10-CM

## 2015-09-17 ENCOUNTER — Other Ambulatory Visit: Payer: Self-pay | Admitting: *Deleted

## 2015-09-19 ENCOUNTER — Other Ambulatory Visit: Payer: Self-pay | Admitting: *Deleted

## 2015-09-19 ENCOUNTER — Telehealth: Payer: Self-pay | Admitting: Pulmonary Disease

## 2015-09-19 ENCOUNTER — Telehealth: Payer: Self-pay | Admitting: *Deleted

## 2015-09-19 MED ORDER — METOPROLOL TARTRATE 50 MG PO TABS: 50.0000 mg | ORAL_TABLET | Freq: Two times a day (BID) | ORAL | 0 refills | Status: DC

## 2015-09-19 MED ORDER — AMOXICILLIN-POT CLAVULANATE 875-125 MG PO TABS: 1.0000 | ORAL_TABLET | Freq: Two times a day (BID) | ORAL | 0 refills | Status: DC

## 2015-09-19 NOTE — Telephone Encounter (Signed)
Called spoke with pt. Informed him of CY's recs and verified pharmacy as [facility] on [address]. He voiced understanding and had no further questions.  Rx sent. Nothing further needed.

## 2015-09-19 NOTE — Telephone Encounter (Signed)
Spoke with pt and he states that he has not improved after abx and oral pred from last week. He c/o continued cough and congestion with dark brown mucus. Pt denies SOB/wheeze or f/n/v. Pt has not taken any OTC meds for symptoms. Pt has been using vest for about a month, BID for about 55mns each time. Pt is taking all medications as directed. Pt uKoreanot sure why he is not getting better.  BQ is on vacation. RB - Please advise. Thanks!

## 2015-09-19 NOTE — Telephone Encounter (Signed)
Offer augmentin 875 mg, # 14, 1 twice daily 

## 2015-09-19 NOTE — Telephone Encounter (Signed)
Patients wife requested samples of pradaxa earlier today. I called to let her know that I would place some at the front desk.    By Juventino Slovak, CMA

## 2015-09-19 NOTE — Telephone Encounter (Signed)
Patients wife requested a thirty day rx with no refills for the metoprolol be sent to [facility] local [facility] to get [facility] through until mail order arrives.

## 2015-09-20 ENCOUNTER — Other Ambulatory Visit: Payer: Self-pay | Admitting: *Deleted

## 2015-09-20 MED ORDER — METOPROLOL TARTRATE 50 MG PO TABS: 50.0000 mg | ORAL_TABLET | Freq: Two times a day (BID) | ORAL | 3 refills | Status: DC

## 2015-09-25 ENCOUNTER — Ambulatory Visit (INDEPENDENT_AMBULATORY_CARE_PROVIDER_SITE_OTHER): Payer: Medicare Other | Admitting: Pulmonary Disease

## 2015-09-25 ENCOUNTER — Telehealth: Payer: Self-pay | Admitting: Pulmonary Disease

## 2015-09-25 ENCOUNTER — Encounter: Payer: Self-pay | Admitting: Pulmonary Disease

## 2015-09-25 ENCOUNTER — Other Ambulatory Visit (INDEPENDENT_AMBULATORY_CARE_PROVIDER_SITE_OTHER): Payer: Medicare Other

## 2015-09-25 VITALS — BP 112/74 | HR 78

## 2015-09-25 DIAGNOSIS — J449 Chronic obstructive pulmonary disease, unspecified: Secondary | ICD-10-CM

## 2015-09-25 DIAGNOSIS — I2584 Coronary atherosclerosis due to calcified coronary lesion: Secondary | ICD-10-CM | POA: Diagnosis not present

## 2015-09-25 DIAGNOSIS — R05 Cough: Secondary | ICD-10-CM | POA: Diagnosis not present

## 2015-09-25 DIAGNOSIS — I2510 Atherosclerotic heart disease of native coronary artery without angina pectoris: Secondary | ICD-10-CM | POA: Diagnosis not present

## 2015-09-25 DIAGNOSIS — R059 Cough, unspecified: Secondary | ICD-10-CM

## 2015-09-25 LAB — BASIC METABOLIC PANEL
BUN: 19 mg/dL (ref 6–23)
CALCIUM: 9.4 mg/dL (ref 8.4–10.5)
CO2: 27 mEq/L (ref 19–32)
CREATININE: 0.85 mg/dL (ref 0.40–1.50)
Chloride: 102 mEq/L (ref 96–112)
GFR: 94.64 mL/min (ref >60.00)
Glucose, Bld: 96 mg/dL (ref 70–99)
Potassium: 5.2 mEq/L — ABNORMAL HIGH (ref 3.5–5.1)
Sodium: 137 mEq/L (ref 135–145)

## 2015-09-25 MED ORDER — LEVOFLOXACIN 500 MG PO TABS: 500.0000 mg | ORAL_TABLET | Freq: Every day | ORAL | 0 refills | Status: DC

## 2015-09-25 NOTE — Telephone Encounter (Signed)
Spoke with pt and he states that he has not improved after Levaquin and Augmentin. He c/o cough with brown mucus, chest congestion and SOB. Pt scheduled to see JN this am. Nothing further needed.

## 2015-09-25 NOTE — Progress Notes (Signed)
Subjective:    Patient ID: Malik Zuniga, male    DOB: 1945/05/14, 70 y.o.   MRN: [id number]  C.C.:  Follow-up for Persistent Cough with known Very Severe COPD, Recurrent Aspiration Pneumonia, Chronic Nocturnal Hypoxia, & RLL NSCLC.  HPI  Cough: Patient was last treated with a five-day course of Levaquin 750 mg starting on 09/13/15 as well as prednisone 20 mg by mouth daily 5 days.  Patient previously prescribed a chest vest for clearance on 08/22/15. He reports his cough seemed to improve significantly on the Levaquin compared with his previous treatment with Amoxicillin. He reports his cough never totally resolved but did significantly worsen once it was stopped. He reports his cough was previously productive of a "dark brown" mucus. He reports his cough now produces copious clear mucus starting today. He does feel the percussion vest has helped expectorate the mucus. He is using his vest twice daily. No hemoptysis.   Very Severe COPD:  Currently on treatment with Pulmicort nebulized & Duonebs. He reports he is compliant with his nebulizer therapies. He has had increased wheezing. He is predominantly sedentary and not active.   Recurrent Aspiration Pneumonia:  Currently recommended a dysphagia 2 diet. Recommended speech therapy twice weekly for 2 weeks in June. Reports he is on a puree diet. Reports he is still following with speech therapy but was on vacation last week. He denies any aspiration events since he went on his current diet.    Chronic Nocturnal Hypoxia:  Currently only using oxygen at night while sleeping. He is using oxygen at 2.5 L/m while sleeping.   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 Status post SBRT December 2016.  Review of Systems No fever that he has noticed. He has had chills and sweats. He reports chronic nausea. He also reports poor sleep and takes naps during the day. Denies any chest pressure or pain.   Allergies  Allergen  Reactions  . Lisinopril Swelling    ANGIOEDEMA  . Lamictal [Lamotrigine] Other (See Comments)    Weakness/difficulty swallowing  . Ambien [Zolpidem] Other (See Comments)    Unknown reaction    Current Outpatient Prescriptions on File Prior to Visit  Medication Sig Dispense Refill  . amoxicillin-clavulanate (AUGMENTIN) 875-125 MG tablet Take 1 tablet by mouth 2 (two) times daily. 14 tablet 0  . ARIPiprazole (ABILIFY) 10 MG tablet Take 5 mg by mouth daily.     . Ascorbic Acid (VITAMIN C) 1000 MG tablet Take 1,000 mg by mouth daily.    Marland Kitchen atorvastatin (LIPITOR) 20 MG tablet Take 1 tablet (20 mg total) by mouth daily at 6 PM. 30 tablet 6  . budesonide (PULMICORT) 0.25 MG/2ML nebulizer solution Take 4 mLs (0.5 mg total) by nebulization 2 (two) times daily. 60 mL 3  . cholecalciferol (VITAMIN D) 1000 UNITS tablet Take 1,000 Units by mouth every morning.     . dabigatran (PRADAXA) 150 MG CAPS capsule Take 1 capsule (150 mg total) by mouth every 12 (twelve) hours. 60 capsule 6  . diazepam (VALIUM) 5 MG tablet Take 1 tablet (5 mg total) by mouth every 8 (eight) hours as needed for anxiety or muscle spasms. scheduled 30 tablet 0  . diltiazem (CARDIZEM CD) 240 MG 24 hr capsule Take 1 capsule (240 mg total) by mouth daily. 30 capsule 6  . divalproex (DEPAKOTE) 500 MG 24 hr tablet Take 1,000 mg by mouth at bedtime.     . docusate sodium (COLACE) 100 MG  capsule Take 200 mg by mouth at bedtime.     . DULoxetine (CYMBALTA) 60 MG capsule Take 120 mg by mouth daily.     . feeding supplement, ENSURE ENLIVE, (ENSURE ENLIVE) LIQD Take 237 mLs by mouth 2 (two) times daily between meals. 237 mL 12  . fluticasone (FLONASE) 50 MCG/ACT nasal spray PLACE 2 SPRAYS INTO BOTH NOSTRILS DAILY. 16 g 2  . guaiFENesin (MUCINEX) 600 MG 12 hr tablet Take 600 mg by mouth as needed. Reported on 08/13/2015    . guaiFENesin (ROBITUSSIN) 100 MG/5ML SOLN Take 5 mLs (100 mg total) by mouth every 4 (four) hours as needed for cough or to  loosen phlegm. 236 mL 0  . HYDROcodone-acetaminophen (NORCO) 10-325 MG per tablet Take 1 tablet by mouth every 6 (six) hours as needed for moderate pain. for pain  0  . ipratropium-albuterol (DUONEB) 0.5-2.5 (3) MG/3ML SOLN Take 3 mLs by nebulization every 6 (six) hours. 360 mL 3  . metoprolol (LOPRESSOR) 50 MG tablet Take 1 tablet (50 mg total) by mouth 2 (two) times daily. 180 tablet 3  . montelukast (SINGULAIR) 10 MG tablet Take 1 tablet (10 mg total) by mouth at bedtime. 90 tablet 1  . nitroGLYCERIN (NITROSTAT) 0.4 MG SL tablet PLACE 1 TABLET (0.4 MG TOTAL) UNDER THE TONGUE EVERY 5 (FIVE) MINUTES AS NEEDED FOR CHEST PAIN. 25 tablet 2  . omeprazole (PRILOSEC) 40 MG capsule Take 40 mg by mouth 2 (two) times daily.     . polyethylene glycol (MIRALAX / GLYCOLAX) packet Take 17 g by mouth daily. For constipation    . primidone (MYSOLINE) 250 MG tablet TAKE 1 TABLET (250 MG TOTAL) BY MOUTH 2 (TWO) TIMES DAILY. 180 tablet 1  . promethazine (PHENERGAN) 25 MG tablet Take 25 mg by mouth every 6 (six) hours as needed for nausea or vomiting. Reported on 04/06/2015  1  . ranitidine (ZANTAC) 150 MG tablet Take 150-300 mg by mouth 2 (two) times daily as needed for heartburn.    Marland Kitchen tiZANidine (ZANAFLEX) 4 MG tablet TAKE 1 TABLET BY MOUTH EVERY 8 HOURS AS NEEDED FOR MUSCLE SPASMS. 90 tablet 1  . Zinc 50 MG TABS Take 50 mg by mouth daily.     Marland Kitchen levofloxacin (LEVAQUIN) 750 MG tablet Take 1 tablet (750 mg total) by mouth daily. (Patient not taking: Reported on 09/25/2015) 5 tablet 0  . predniSONE (DELTASONE) 20 MG tablet Take 1 tablet (20 mg total) by mouth daily with breakfast. (Patient not taking: Reported on 09/25/2015) 5 tablet 0   No current facility-administered medications on file prior to visit.     Past Medical History:  Diagnosis Date  . Anemia associated with acute blood loss   . Angioedema 10/31/2010  . Anxiety    takes Valium daily  . Aspiration pneumonia ([place]) 2010  . Asthma   . Bacteremia due to  Pseudomonas 10/04/2011   D/t gram neg rods   . 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 Chronic back pain    compression fracture  . Constipation    takes Colace daily as well as Miralax  . Coronary artery disease   . Depression    takes Cymbalta daily  . Emphysema of lung ([facility])    Albuterol as needed;Symbicort daily and Singulair at bedtime  . GERD (gastroesophageal reflux disease)    takes Omeprazole daily  . Headache, chronic daily   . History of bronchitis   .  History of colon polyps   . History of migraine    last migraine a couple of days ago;takes Excedrin Migraine  . History of prostate cancer 2004  . Hyperlipidemia    takes Simvastatin daily  . Hypertension    takes Metoprolol daily as well as Hyzaar  . Insomnia    takes Benadryl nightly  . Lung cancer ([place]) 2016  . Mood change ([place])    after Brain surgery mood changed and was placed on Depakote  . Myocardial infarction ([place]) 04/1998  . Neuromuscular disorder ([place]) 1998   right carpal tunnel release  . On home O2   . Shortness of breath    with exertion  . Stroke [facility]) 1998   Brain Aneurysm  . Tremor     Past Surgical History:  Procedure Laterality Date  . BACK SURGERY  08/2004; 02/2005; 04/2006; 06/2007; 7/20101   all by Dr. Annette Stable  . BACK SURGERY  05/2013  . BRAIN SURGERY  1999   clip aneurysm  . [place]   right  . CHOLECYSTECTOMY  1996  . COLONOSCOPY    . CORONARY ANGIOPLASTY WITH STENT PLACEMENT  2000   RCA stent, Dr. Rollene Fare; denies stent for PAD 06/15/13  . CORONARY ANGIOPLASTY WITH STENT PLACEMENT  04/1998  . CRANIOTOMY  1999   to clip aneurism, Dr. Annette Stable  . DG BIOPSY LUNG    . ESOPHAGOGASTRODUODENOSCOPY N/A 07/23/2012   Procedure: ESOPHAGOGASTRODUODENOSCOPY (EGD);  Surgeon: Jeryl Columbia, MD;  Location: [facility] ENDOSCOPY;  Service: Endoscopy;  Laterality: N/A;  buccini /ja  . GASTROSTOMY W/ FEEDING TUBE  2008   Dr. Watt Climes; only had for a few months  .  HAND SURGERY  1989   crushed thumb, Dr. Fredna Dow  . PROSTATECTOMY  04/2001   removal of prostate cancer, Dr. Janice Norrie  . SPINE SURGERY    . [place]   left arm, Dr. Fredna Dow    Family History  Problem Relation Age of Onset  . Heart disease Mother   . Cancer Father     brain cancer and prostate cancer     Social History   Social History  . Marital status: Married    Spouse name: N/A  . Number of children: N/A  . Years of education: N/A   Social History Main Topics  . Smoking status: Former Smoker    Packs/day: 1.50    Years: 52.00    Types: Cigarettes    Quit date: 07/17/2012  . Smokeless tobacco: Never Used     Comment: Former smoker  . Alcohol use No  . Drug use:     Types: Mescaline  . Sexual activity: Yes   Other Topics Concern  . None   Social History Narrative  . None      Objective:   Physical Exam BP 112/74 (BP Location: Left Arm, Cuff Size: Normal)   Pulse 78   SpO2 (!) 89%  General:  Awake. Alert. No distress. Wife with patient. Integument:  Warm & dry. No rash on exposed skin. HEENT:  Moist mucus membranes. No oral ulcers. No scleral injection or icterus. No nasal turbinate swelling. Cardiovascular:  Regular rate. No edema. Normal S1 & S2.  Pulmonary:  Diminished breath sounds bilateral lung bases. Normal work of breathing on room air. Mild end expiratory wheeze. Abdomen: Soft. Protuberant. Nontender.   PFT 11/14/14: FVC 2.53 L (86%) FEV1 1.08 L (32%) FEV1/FVC 0.43 FEF 25-75 0.33 L (13%) positive bronchodilator response TLC  8.40 L (119%) RV 246% ERV 59% DLCO uncorrected 37%  IMAGING CT CHEST W/O 04/16/15 (personally reviewed by me): No pleural effusion. Adjacent pleural thickening to left lower lobe opacity as well as resolving right lower lobe opacity/nodule. Mild to moderate central lobular emphysema noted. No pathologic mediastinal adenopathy.  CARDIAC TTE (08/06/15): LV normal in size with EF 50-55%. No regional wall motion abnormalities.  Normal diastolic dysfunction of LV. LA mildly dilated with normal RA size. RV mildly dilated with moderately reduced systolic function. Pulmonary artery normal in size. Pulmonary artery systolic pressure 27 mmHg. No aortic stenosis or regurgitation. Aortic root normal in size. No mitral stenosis or regurgitation. No pulmonic stenosis or regurgitation. Mild tricuspid regurgitation. No pericardial effusion.  MICROBIOLOGY Tracheal Asp Ctx (08/04/15):  Respiratory Flora HIV (10/26/13):  Nonreactive   LABS 08/03/15 Acetylcholine Block Ab:  24 Acetylcholine Binding Ab:  <0.03 Acetylcholine Modulating Ab:  <12 ABG:  7.37/38/249/Sat 100%  06/21/15 IgE:  13    Assessment & Plan:  70 y.o. male with known history of recurrent aspiration pneumonia and underlying very severe COPD. Discussed at length with the patient and his wife that his recurrent symptoms are likely due to aspiration. I do question whether or not he may have underlying pseudomonal infection and would benefit from increased airway clearance with use of percussion vest as well as a lengthier course of treatment with Levaquin. I instructed the patient contact our office if he had any new breathing problems or worsening in his cough before his next appointment. I am holding off on prednisone therapy at this time. Patient wishes to defer chest x-ray imaging to next appointment.  1. Cough: Checking sputum culture for AFB, fungus, and bacteria. Deferring chest x-ray at patient request today. If symptoms do not improve patient is willing to undergo chest x-ray imaging. Empiric treatment with Levaquin 500 mg by mouth daily 14 days. Increasing use of percussion vest to 3 times daily. 2. Very severe COPD: Continuing Pulmicort & Duonebs as prescribed. 3. Follow-up: Patient will keep his follow-up appointment with Dr. [name] on 8/30 as scheduled.  Sonia Baller Ashok Cordia, M.D. [facility]monary [facility] Pager:  [phone] After 3pm or if no  response, call [phone] 11:19 AM 09/25/15

## 2015-09-25 NOTE — Telephone Encounter (Signed)
Levaquin redirected from [facility] to [facility] per patient's request. Patient aware that Rx has been sent. Nothing further needed.

## 2015-09-25 NOTE — Patient Instructions (Addendum)
   Start using your Percussion Vest 3 times daily.  Continue using your nebulizer medications as prescribed.  Call us if you have any worsening in your cough or breathing before your next appointment.  We will keep you 8/30 appointment with Dr. [name] as scheduled.  TESTS ORDERED: 1. Sputum Culture AFB, Fungus, & Bacteria. 2. Quantitative Immunoglobulin Panel & BMP today.

## 2015-09-26 LAB — IGG, IGA, IGM
IGG (IMMUNOGLOBIN G), SERUM: 868 mg/dL (ref 694–1618)
IgA: 317 mg/dL (ref 81–463)
IgM, Serum: 199 mg/dL (ref 48–271)

## 2015-09-27 ENCOUNTER — Telehealth: Payer: Self-pay | Admitting: *Deleted

## 2015-09-27 NOTE — Telephone Encounter (Signed)
called to verify RX sent 09/20/15 for Metoprolol Lopressor 50 mg 1 tab po bid. Verified fixed by Larene Beach @ [facility].

## 2015-10-02 ENCOUNTER — Ambulatory Visit (INDEPENDENT_AMBULATORY_CARE_PROVIDER_SITE_OTHER): Payer: Medicare Other | Admitting: Internal Medicine

## 2015-10-02 ENCOUNTER — Encounter: Payer: Self-pay | Admitting: Internal Medicine

## 2015-10-02 ENCOUNTER — Telehealth: Payer: Self-pay | Admitting: Pulmonary Disease

## 2015-10-02 VITALS — BP 102/70 | HR 72 | Temp 97.7°F

## 2015-10-02 DIAGNOSIS — I2510 Atherosclerotic heart disease of native coronary artery without angina pectoris: Secondary | ICD-10-CM

## 2015-10-02 DIAGNOSIS — J449 Chronic obstructive pulmonary disease, unspecified: Secondary | ICD-10-CM

## 2015-10-02 DIAGNOSIS — J9611 Chronic respiratory failure with hypoxia: Secondary | ICD-10-CM | POA: Diagnosis not present

## 2015-10-02 DIAGNOSIS — I2584 Coronary atherosclerosis due to calcified coronary lesion: Secondary | ICD-10-CM

## 2015-10-02 MED ORDER — PREDNISONE 10 MG PO TABS: ORAL_TABLET | ORAL | 0 refills | Status: DC

## 2015-10-02 NOTE — Telephone Encounter (Signed)
Patient notified of Dr. Anastasia Pall recommendations.  Attempted to set up appointment for patient to see Dr. Melvyn Novas today at 3:45pm. Patient refused appointment and  He put his daughter in law on the phone and she said that she would have to discuss with her mother in law who is currently admitted.  She said she would call back to schedule appointment with Dr. Melvyn Novas if patient decides to come in.   Nothing further needed.

## 2015-10-02 NOTE — Progress Notes (Signed)
Subjective:    Patient ID: Malik Zuniga, male    DOB: 08/22/1945  MRN: [id number]  Brief patient profile:  70 yo wm quit smoking 07/2012 with Very Severe COPD, Recurrent Aspiration Pneumonia, Chronic Nocturnal Hypoxia, & RLL NSCLC.  HPI  Cough: Patient was last treated with a five-day course of Levaquin 750 mg starting on 09/13/15 as well as prednisone 20 mg by mouth daily 5 days.  Patient previously prescribed a chest vest for clearance on 08/22/15. He reports his cough seemed to improve significantly on the Levaquin compared with his previous treatment with Amoxicillin. He reports his cough never totally resolved but did significantly worsen once it was stopped. He reports his cough was previously productive of a "dark brown" mucus. He reports his cough now produces copious clear mucus starting today. He does feel the percussion vest has helped expectorate the mucus. He is using his vest twice daily. No hemoptysis.   Very Severe COPD:  Currently on treatment with Pulmicort nebulized & Duonebs. He reports he is compliant with his nebulizer therapies. He has had increased wheezing. He is predominantly sedentary and not active.   Recurrent Aspiration Pneumonia:  Currently recommended a dysphagia 2 diet. Recommended speech therapy twice weekly for 2 weeks in June. Reports he is on a puree diet. Reports he is still following with speech therapy but was on vacation last week. He denies any aspiration events since he went on his current diet.    Chronic Nocturnal Hypoxia:  Currently only using oxygen at night while sleeping. He is using oxygen at 2.5 L/m while sleeping.   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 Status post SBRT December 2016.   10/03/2015 acute extended ov/Latrell [place] re: aecopd/ purulent bronchitis/ already on levaquin since 09/25/15  X 2 week course  Chief Complaint  Patient presents with  . Acute Visit    Pt c/o increasede cough with brown sputum.    acutely worse x 1 week/ confused with the 3 different neb solutions in terms of maint vs prns. Comfortable at rest p albtuerol but using more than usual.  No obvious day to day or daytime variability or assoc  cp or chest tightness, subjective wheeze or overt sinus or hb symptoms. No unusual exp hx or h/o childhood pna/ asthma or knowledge of premature birth.  Sleeping ok without nocturnal  or early am exacerbation  of respiratory  c/o's or need for noct saba. Also denies any obvious fluctuation of symptoms with weather or environmental changes or other aggravating or alleviating factors except as outlined above   Current Medications, Allergies, Complete Past Medical History, Past Surgical History, Family History, and Social History were reviewed in [employer] record.  ROS  The following are not active complaints unless bolded sore throat, dysphagia, dental problems, itching, sneezing,  nasal congestion or excess/ purulent secretions, ear ache,   fever, chills, sweats, unintended wt loss, classically pleuritic or exertional cp, hemoptysis,  orthopnea pnd or leg swelling, presyncope, palpitations, abdominal pain, anorexia, nausea, vomiting, diarrhea  or change in bowel or bladder habits, change in stools or urine, dysuria,hematuria,  rash, arthralgias, visual complaints, headache, numbness, weakness or ataxia or problems with walking or coordination,  change in mood/affect or memory.                  Objective:   Physical Exam  W/c bound wm nad  Wt Readings from Last 3 Encounters:  09/13/15 157 lb 9.6 oz (  71.5 kg)  09/11/15 158 lb 12.8 oz (72 kg)  09/04/15 153 lb (69.4 kg)    Vital signs reviewed -  Note RA - 90% RA in w/c     General:  Awake. Alert. No distress. Wife with patient. Integument:  Warm & dry. No rash on exposed skin. HEENT:  Moist mucus membranes. No oral ulcers. No scleral injection or icterus. No nasal turbinate swelling. Cardiovascular:  Regular  rate. No edema. Normal S1 & S2.  Pulmonary:  Diminished breath sounds bilateral lung bases. Normal work of breathing on room air. Bilateral mid /late expiratory wheeze/cough  Abdomen: Soft. Protuberant. Nontender.   PFT 11/14/14: FVC 2.53 L (86%) FEV1 1.08 L (32%) FEV1/FVC 0.43 FEF 25-75 0.33 L (13%) positive bronchodilator response TLC 8.40 L (119%) RV 246% ERV 59% DLCO uncorrected 37%  IMAGING CT CHEST W/O 04/16/15 (personally reviewed by me): No pleural effusion. Adjacent pleural thickening to left lower lobe opacity as well as resolving right lower lobe opacity/nodule. Mild to moderate central lobular emphysema noted. No pathologic mediastinal adenopathy.  CARDIAC TTE (08/06/15): LV normal in size with EF 50-55%. No regional wall motion abnormalities. Normal diastolic dysfunction of LV. LA mildly dilated with normal RA size. RV mildly dilated with moderately reduced systolic function. Pulmonary artery normal in size. Pulmonary artery systolic pressure 27 mmHg. No aortic stenosis or regurgitation. Aortic root normal in size. No mitral stenosis or regurgitation. No pulmonic stenosis or regurgitation. Mild tricuspid regurgitation. No pericardial effusion.      CXR PA and Lateral:   10/02/2015 :     > pt refused     Assessment & Plan:

## 2015-10-02 NOTE — Telephone Encounter (Signed)
If he is currently taking Levaquin I think he needs an acute visit.  In the meantime recommend mucinex, drink plenty of fluids

## 2015-10-02 NOTE — Patient Instructions (Addendum)
Plan A = Automatic = pulmocort /duoneb when you get up and about 12 hours later and duoneb 6 after the first and last combination treatment   Plan B = Backup Only use your albuterol neb  as a rescue medication to be used if you can't catch your breath by resting or doing a relaxed purse lip breathing pattern - ok to use up to every 4 hours as needed  Prednisone Take 4 for two days three for two days two for two days one for two days   Be sure you time your omeprazole (prilosec) to Take 30- 60 min before your first and last meals of the day   Use the flutter valve and vest as much as possible   Keep appt to see Dr [name]

## 2015-10-02 NOTE — Telephone Encounter (Signed)
Patient states that he is coughing up light brown mucus, wheezing, chest tightness.  Patient is currently taking Levaquin, does not take any prednisone.  No fever, but has chills.  Allergies  Allergen Reactions  . Lisinopril Swelling    ANGIOEDEMA  . Lamictal [Lamotrigine] Other (See Comments)    Weakness/difficulty swallowing  . Ambien [Zolpidem] Other (See Comments)    Unknown reaction

## 2015-10-03 ENCOUNTER — Encounter: Payer: Self-pay | Admitting: Internal Medicine

## 2015-10-03 NOTE — Assessment & Plan Note (Signed)
sats boderline on RA but uses 02 at hs   rec 02 hs 2lpm and prn daytime for sob or sats > 90% if monitoring by pulse ox

## 2015-10-03 NOTE — Assessment & Plan Note (Signed)
Malik Zuniga 04/2009:  FEV1 1.65 (47%), FEV1% 49  - PFT's  11/13/14  FEV1 1.56 (45 % ) ratio 45  p 38 % improvement from saba with DLCO  37 % corrects to 44 % for alv volume   Already on prolonged course of levaquin and refuses cxr.  Since has clear asthmatic component rec pred x 8 days and work harder on organizing his maint vs prns   I had an extended discussion with the patient/daughter in law reviewing all relevant studies completed to date and  lasting 15 to 20 minutes of a 25 minute visit    Each maintenance medication was reviewed in detail including most importantly the difference between maintenance and prns and under what circumstances the prns are to be triggered using an action plan format that is not reflected in the computer generated alphabetically organized AVS.    Please see instructions for details which were reviewed in writing and the patient given a copy highlighting the part that I personally wrote and discussed at today's ov.

## 2015-10-04 ENCOUNTER — Telehealth: Payer: Self-pay | Admitting: Emergency Medicine

## 2015-10-04 NOTE — Telephone Encounter (Signed)
Verbal ok given to vanessa on voicemail. Pt has missed appt due to wife being in the hospital.

## 2015-10-04 NOTE — Telephone Encounter (Signed)
Malik Zuniga a Astronomer at [facility] calling for verbal orders for possible re-cert decision for Monday and to extend one more session for swallowing therapy. Patient has missed several appointments this week due to family issues. Please advise

## 2015-10-05 ENCOUNTER — Telehealth: Payer: Self-pay | Admitting: Interventional Cardiology

## 2015-10-05 NOTE — Telephone Encounter (Signed)
Pt's wife calling requesting more samples of Pradaxa 150 mg tablet taken BID. Pt just got samples of Pradaxa 150 mg tablet on 09/19/15. I offered pt's wife the assistant program, wife stated that she did not need to apply because she thinks that Dr. Tamala Julian will be taking the pt off of the medication when pt comes in on 11/01/15 for OV and that the medication was to expensive to get from the pharmacy. I advised that pt's wife that I was leaving a week supply of the Pradaxa 150 mg tablet, Lot: # [id number], Exp: 8/19, up at the front desk for pt to pick up and that I would make Dr. Tamala Julian aware of the medication problem. I informed the wife that if the pt has any other problems, questions or concerns to call our office. Wife verbalized understanding.

## 2015-10-07 NOTE — Telephone Encounter (Signed)
May be able to stop Pradaxa after discussion at [place], Has acute AFlutter due to resp failure

## 2015-10-08 ENCOUNTER — Other Ambulatory Visit: Payer: Self-pay | Admitting: Pulmonary Disease

## 2015-10-08 DIAGNOSIS — I4892 Unspecified atrial flutter: Secondary | ICD-10-CM | POA: Diagnosis not present

## 2015-10-08 DIAGNOSIS — J9620 Acute and chronic respiratory failure, unspecified whether with hypoxia or hypercapnia: Secondary | ICD-10-CM | POA: Diagnosis not present

## 2015-10-08 NOTE — Telephone Encounter (Signed)
Pt wife aware of Dr.Smith's response. Adv her that d/c of Pradaxa can be discussed at pt upcoming appt. Pt is concerned of the cost of Pradaxa. Adv pt that we cannot guarantee that we will have samples, but they can call and check if so we would be happy to provide.

## 2015-10-10 ENCOUNTER — Encounter: Payer: Self-pay | Admitting: Pulmonary Disease

## 2015-10-10 ENCOUNTER — Ambulatory Visit (INDEPENDENT_AMBULATORY_CARE_PROVIDER_SITE_OTHER): Payer: Medicare Other | Admitting: Pulmonary Disease

## 2015-10-10 DIAGNOSIS — Z23 Encounter for immunization: Secondary | ICD-10-CM | POA: Diagnosis not present

## 2015-10-10 DIAGNOSIS — J449 Chronic obstructive pulmonary disease, unspecified: Secondary | ICD-10-CM

## 2015-10-10 DIAGNOSIS — I2510 Atherosclerotic heart disease of native coronary artery without angina pectoris: Secondary | ICD-10-CM

## 2015-10-10 DIAGNOSIS — J471 Bronchiectasis with (acute) exacerbation: Secondary | ICD-10-CM

## 2015-10-10 DIAGNOSIS — I2584 Coronary atherosclerosis due to calcified coronary lesion: Secondary | ICD-10-CM

## 2015-10-10 MED ORDER — PREDNISONE 10 MG PO TABS: 10.0000 mg | ORAL_TABLET | Freq: Every day | ORAL | 0 refills | Status: DC

## 2015-10-10 NOTE — Assessment & Plan Note (Signed)
He's been doing well with his therapy vest. It seems that his flareup a week ago came from 3 days of not using his therapy vest when his wife was hospitalized.  Today we discussed the importance of using this regular.

## 2015-10-10 NOTE — Progress Notes (Signed)
Subjective:    Patient ID: Malik Zuniga, male    DOB: 1945/04/25, 70 y.o.   MRN: [id number]  Synopsis: former patient of Dr. 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   Malik Zuniga 04/2009:  FEV1 1.65 (47%), FEV1% 49  CT chest 10/2013:  RLL infiltrates CXR 10/2013:  LLL infiltrate +h/o aspiration in the past.  +speech therapy evaluation with recs given.  Admitted multiple times in 2017 for recurrent aspiration pneumonia. Started therapy vest 2017  HPI  Chief Complaint  Patient presents with  . Follow-up    Pt states he is doing well today, no complaints today.  Pt wants to discuss maintenance prednisone.     Malik Zuniga says that he feels good today. He notes that the prednisone has really helped. He has been very concientious about using the vest and his nebulized therapy reguarly. He brings up a lot of mucus after using his therapy vest.   He says his cough was worse a week ago because he missed his therapy vest a few days while his wife was hospitalized.  Past Medical History:  Diagnosis Date  . Anemia associated with acute blood loss   . Angioedema 10/31/2010  . Anxiety    takes Valium daily  . Aspiration pneumonia ([place]) 2010  . Asthma   . Bacteremia due to Pseudomonas 10/04/2011   D/t gram neg rods   . 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 Chronic back pain    compression fracture  . Constipation    takes Colace daily as well as Miralax  . Coronary artery disease   . Depression    takes Cymbalta daily  . Emphysema of lung (HCC)    Albuterol as needed;Symbicort daily and Singulair at bedtime  . GERD (gastroesophageal reflux disease)    takes Omeprazole daily  . Headache, chronic daily   . History of bronchitis   . History of colon polyps   . History of migraine    last migraine a couple of days ago;takes Excedrin  Migraine  . History of prostate cancer 2004  . Hyperlipidemia    takes Simvastatin daily  . Hypertension    takes Metoprolol daily as well as Hyzaar  . Insomnia    takes Benadryl nightly  . Lung cancer ([place]) 2016  . Mood change ([place])    after Brain surgery mood changed and was placed on Depakote  . Myocardial infarction ([place]) 04/1998  . Neuromuscular disorder ([place]) 1998   right carpal tunnel release  . On home O2   . Shortness of breath    with exertion  . 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 Tremor         Review of Systems  Constitutional: Negative for chills, fatigue and fever.  HENT: Negative for postnasal drip, rhinorrhea and sinus pressure.   Respiratory: Positive for cough and shortness of breath. Negative for wheezing.        Objective:   Physical Exam Vitals:   10/10/15 1051  BP: 128/76  Pulse: 83  SpO2: 94%   RA  Gen: chronically ill appearing, in wheelchair HENT: OP clear, TM's clear, neck supple PULM: CTA B, normal percussion and effort CV: RRR, no mgr, trace edema GI: BS+, soft, nontender Derm: no cyanosis or rash Psyche: normal mood and  affect   The most recent records from our clinic reviewed today      Assessment & Plan:  COPD   Thanks to the excellent care provided by my partners Malik Zuniga looks great today compared to prior visits. I believe he is doing well because of adherence to his therapy vest. He wants to go on maintenance prednisone. I'm reluctant to do this because of the multiple side effects that will not help his overall situation but I think a low-dose for approximately 2-3 months may be necessary as he's been hospitalized so many times recently and he does seem to do better with prednisone on board.  Plan: Continue adherence to therapy vest twice a day Continue Pulmicort twice a day Continue DuoNeb twice a day Extent prednisone 10 mg daily for the next month, return to see her nurse practitioner, he's doing well on that visit  then drop down to 5 mg daily for a month, then return in 4 weeks again, if doing well then, reduce dose to 5 mg every other day and then stop Flu shot today  Bronchiectasis without acute exacerbation ([place]) He's been doing well with his therapy vest. It seems that his flareup a week ago came from 3 days of not using his therapy vest when his wife was hospitalized.  Today we discussed the importance of using this regular.    Current Outpatient Prescriptions:  .  ARIPiprazole (ABILIFY) 10 MG tablet, Take 5 mg by mouth daily. , Disp: , Rfl:  .  Ascorbic Acid (VITAMIN C) 1000 MG tablet, Take 1,000 mg by mouth daily., Disp: , Rfl:  .  atorvastatin (LIPITOR) 20 MG tablet, Take 1 tablet (20 mg total) by mouth daily at 6 PM., Disp: 30 tablet, Rfl: 6 .  budesonide (PULMICORT) 0.25 MG/2ML nebulizer solution, Take 4 mLs (0.5 mg total) by nebulization 2 (two) times daily., Disp: 60 mL, Rfl: 3 .  cholecalciferol (VITAMIN D) 1000 UNITS tablet, Take 1,000 Units by mouth every morning. , Disp: , Rfl:  .  dabigatran (PRADAXA) 150 MG CAPS capsule, Take 1 capsule (150 mg total) by mouth every 12 (twelve) hours., Disp: 60 capsule, Rfl: 6 .  diazepam (VALIUM) 5 MG tablet, Take 1 tablet (5 mg total) by mouth every 8 (eight) hours as needed for anxiety or muscle spasms. scheduled, Disp: 30 tablet, Rfl: 0 .  diltiazem (CARDIZEM CD) 240 MG 24 hr capsule, Take 1 capsule (240 mg total) by mouth daily., Disp: 30 capsule, Rfl: 6 .  divalproex (DEPAKOTE) 500 MG 24 hr tablet, Take 1,000 mg by mouth at bedtime. , Disp: , Rfl:  .  docusate sodium (COLACE) 100 MG capsule, Take 200 mg by mouth at bedtime. , Disp: , Rfl:  .  DULoxetine (CYMBALTA) 60 MG capsule, Take 120 mg by mouth daily. , Disp: , Rfl:  .  feeding supplement, ENSURE ENLIVE, (ENSURE ENLIVE) LIQD, Take 237 mLs by mouth 2 (two) times daily between meals., Disp: 237 mL, Rfl: 12 .  fluticasone (FLONASE) 50 MCG/ACT nasal spray, PLACE 2 SPRAYS INTO BOTH NOSTRILS  DAILY., Disp: 16 g, Rfl: 2 .  guaiFENesin (MUCINEX) 600 MG 12 hr tablet, Take 600 mg by mouth as needed. Reported on 08/13/2015, Disp: , Rfl:  .  guaiFENesin (ROBITUSSIN) 100 MG/5ML SOLN, Take 5 mLs (100 mg total) by mouth every 4 (four) hours as needed for cough or to loosen phlegm., Disp: 236 mL, Rfl: 0 .  HYDROcodone-acetaminophen (NORCO) 10-325 MG per tablet, Take 1 tablet by mouth  every 6 (six) hours as needed for moderate pain. for pain, Disp: , Rfl: 0 .  ipratropium-albuterol (DUONEB) 0.5-2.5 (3) MG/3ML SOLN, Take 3 mLs by nebulization every 6 (six) hours., Disp: 360 mL, Rfl: 3 .  levofloxacin (LEVAQUIN) 500 MG tablet, Take 1 tablet (500 mg total) by mouth daily., Disp: 14 tablet, Rfl: 0 .  metoprolol (LOPRESSOR) 50 MG tablet, Take 1 tablet (50 mg total) by mouth 2 (two) times daily., Disp: 180 tablet, Rfl: 3 .  montelukast (SINGULAIR) 10 MG tablet, Take 1 tablet (10 mg total) by mouth at bedtime., Disp: 90 tablet, Rfl: 1 .  nitroGLYCERIN (NITROSTAT) 0.4 MG SL tablet, PLACE 1 TABLET (0.4 MG TOTAL) UNDER THE TONGUE EVERY 5 (FIVE) MINUTES AS NEEDED FOR CHEST PAIN., Disp: 25 tablet, Rfl: 2 .  omeprazole (PRILOSEC) 40 MG capsule, Take 40 mg by mouth 2 (two) times daily. , Disp: , Rfl:  .  polyethylene glycol (MIRALAX / GLYCOLAX) packet, Take 17 g by mouth daily. For constipation, Disp: , Rfl:  .  primidone (MYSOLINE) 250 MG tablet, TAKE 1 TABLET (250 MG TOTAL) BY MOUTH 2 (TWO) TIMES DAILY., Disp: 180 tablet, Rfl: 1 .  promethazine (PHENERGAN) 25 MG tablet, Take 25 mg by mouth every 6 (six) hours as needed for nausea or vomiting. Reported on 04/06/2015, Disp: , Rfl: 1 .  ranitidine (ZANTAC) 150 MG tablet, Take 150-300 mg by mouth 2 (two) times daily as needed for heartburn., Disp: , Rfl:  .  tiZANidine (ZANAFLEX) 4 MG tablet, TAKE 1 TABLET BY MOUTH EVERY 8 HOURS AS NEEDED FOR MUSCLE SPASMS., Disp: 90 tablet, Rfl: 1 .  Zinc 50 MG TABS, Take 50 mg by mouth daily. , Disp: , Rfl:  .  predniSONE  (DELTASONE) 10 MG tablet, Take 1 tablet (10 mg total) by mouth daily with breakfast., Disp: 30 tablet, Rfl: 0

## 2015-10-10 NOTE — Assessment & Plan Note (Signed)
Thanks to the excellent care provided by my partners Malik Zuniga looks great today compared to prior visits. I believe he is doing well because of adherence to his therapy vest. He wants to go on maintenance prednisone. I'm reluctant to do this because of the multiple side effects that will not help his overall situation but I think a low-dose for approximately 2-3 months may be necessary as he's been hospitalized so many times recently and he does seem to do better with prednisone on board.  Plan: Continue adherence to therapy vest twice a day Continue Pulmicort twice a day Continue DuoNeb twice a day Extent prednisone 10 mg daily for the next month, return to see her nurse practitioner, he's doing well on that visit then drop down to 5 mg daily for a month, then return in 4 weeks again, if doing well then, reduce dose to 5 mg every other day and then stop Flu shot today

## 2015-10-10 NOTE — Patient Instructions (Signed)
Take prednisone 10 mg daily for the next month, followed by 5 mg daily for the next month Keep using your Pulmicort twice a day Keep using the DuoNeb 4 times a day Keep using the therapy vest twice a day Follow-up in 4 weeks with our nurse practitioner, if you're doing well at that visit then we will reduce the dose of prednisone to 5 mg daily

## 2015-10-12 ENCOUNTER — Other Ambulatory Visit: Payer: Self-pay | Admitting: *Deleted

## 2015-10-12 ENCOUNTER — Encounter: Payer: Self-pay | Admitting: *Deleted

## 2015-10-16 ENCOUNTER — Ambulatory Visit (HOSPITAL_COMMUNITY)
Admission: RE | Admit: 2015-10-16 | Discharge: 2015-10-16 | Disposition: A | Discharge status: Home / Self Care | Payer: Medicare Other | Source: Ambulatory Visit | Attending: Radiation Oncology | Admitting: Radiation Oncology

## 2015-10-16 ENCOUNTER — Encounter (HOSPITAL_COMMUNITY): Payer: Self-pay

## 2015-10-16 DIAGNOSIS — Z923 Personal history of irradiation: Secondary | ICD-10-CM | POA: Diagnosis not present

## 2015-10-16 DIAGNOSIS — R918 Other nonspecific abnormal finding of lung field: Secondary | ICD-10-CM | POA: Diagnosis not present

## 2015-10-16 DIAGNOSIS — C3431 Malignant neoplasm of lower lobe, right bronchus or lung: Secondary | ICD-10-CM | POA: Diagnosis not present

## 2015-10-16 DIAGNOSIS — R938 Abnormal findings on diagnostic imaging of other specified body structures: Secondary | ICD-10-CM | POA: Insufficient documentation

## 2015-10-17 ENCOUNTER — Encounter: Payer: Self-pay | Admitting: Interventional Cardiology

## 2015-10-17 DIAGNOSIS — H2513 Age-related nuclear cataract, bilateral: Secondary | ICD-10-CM | POA: Diagnosis not present

## 2015-10-18 ENCOUNTER — Ambulatory Visit
Admission: RE | Admit: 2015-10-18 | Discharge: 2015-10-18 | Disposition: A | Discharge status: Home / Self Care | Payer: Medicare Other | Source: Ambulatory Visit | Attending: Radiation Oncology | Admitting: Radiation Oncology

## 2015-10-18 ENCOUNTER — Encounter: Payer: Self-pay | Admitting: Radiation Oncology

## 2015-10-18 VITALS — BP 145/96 | HR 89 | Resp 18 | Wt 158.0 lb

## 2015-10-18 DIAGNOSIS — C61 Malignant neoplasm of prostate: Secondary | ICD-10-CM | POA: Diagnosis not present

## 2015-10-18 DIAGNOSIS — Z08 Encounter for follow-up examination after completed treatment for malignant neoplasm: Secondary | ICD-10-CM | POA: Diagnosis not present

## 2015-10-18 DIAGNOSIS — J180 Bronchopneumonia, unspecified organism: Secondary | ICD-10-CM | POA: Diagnosis not present

## 2015-10-18 DIAGNOSIS — C3431 Malignant neoplasm of lower lobe, right bronchus or lung: Secondary | ICD-10-CM | POA: Diagnosis not present

## 2015-10-18 NOTE — Progress Notes (Addendum)
Radiation Oncology         [phone] ________________________________  Name: ZEBULIN SIEGEL MRN: [id number]  Date: 10/18/2015  DOB: 1945-06-26    Follow-Up Visit Note  CC: [name], MD  [name], MD  Diagnosis:   Mr. 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    ICD-9-CM ICD-10-CM   1. Prostate cancer ([place]) 185 C61   2. Cancer of lower lobe of right lung (HCC) 162.5 C34.31     Interval Since Last Radiation:  9  months   Completed on 01/22/2015: The target was treated to 50 Gy in 5 fractions of 10 Gy  Narrative:  The patient returns today for routine follow-up and returns to review recent Chest CT from 10/16/15. Essentially this revealed stability of his previously treated RLL with thickening consistent with prior radiotherapy. HE has waxing and waning consolidation in the RML and LLL.  He was  hospitalized in May and June for pneumonia and tachycardia. He reports he continues to battle pneumonia and must wear a percussion vest twice a day. Yesterday he had teeth removed in preparation for a partial.   On review of systems, the patient reports that he is doing well overall. He denies any chest pain, fevers, chills, night sweats, unintended weight changes. He reports a productive cough with yellow to green sputum. Reports SOB continues. This seems to be better since his percussion vest was started. He also continues to use an Acapella spirometer. He reports continued difficulty swallowing certain foods but, no associated pain with swallowing. He continues to supplement his diet with one Ensure can per day. He denies any bowel or bladder disturbances, and denies abdominal pain, or vomiting.  He reports persistent nausea. He denies any new musculoskeletal or joint aches or pains. He reports chronic back pain 3 on a scale of 0-10 which is no worse. He reports frequency  of dizziness is less. He reports diplopia, further explaining he was seen twice by his eye doctor for diplopia and it seems to be improving. A complete review of systems is obtained and is otherwise negative.                       Past Medical History:  Past Medical History:  Diagnosis Date  . Anemia associated with acute blood loss   . Angioedema 10/31/2010  . Anxiety    takes Valium daily  . Aspiration pneumonia ([place]) 2010  . Asthma   . Bacteremia due to Pseudomonas 10/04/2011   D/t gram neg rods   . 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 Chronic back pain    compression fracture  . Constipation    takes Colace daily as well as Miralax  . Coronary artery disease   . Depression    takes Cymbalta daily  . Emphysema of lung (HCC)    Albuterol as needed;Symbicort daily and Singulair at bedtime  . GERD (gastroesophageal reflux disease)    takes Omeprazole daily  . Headache, chronic daily   . History of bronchitis   . History of colon polyps   . History of migraine    last migraine a couple of days ago;takes Excedrin Migraine  . History of prostate cancer 2004  . Hyperlipidemia    takes Simvastatin daily  . Hypertension  takes Metoprolol daily as well as Hyzaar  . Insomnia    takes Benadryl nightly  . Lung cancer ([place]) 2016  . Mood change ([place])    after Brain surgery mood changed and was placed on Depakote  . Myocardial infarction ([place]) 04/1998  . Neuromuscular disorder ([place]) 1998   right carpal tunnel release  . On home O2   . Shortness of breath    with exertion  . Stroke [facility]) 1998   Brain Aneurysm  . Tremor     Past Surgical History: Past Surgical History:  Procedure Laterality Date  . BACK SURGERY  08/2004; 02/2005; 04/2006; 06/2007; 7/20101   all by Dr. Annette Stable  . BACK SURGERY  05/2013  . BRAIN SURGERY  1999   clip aneurysm  . [place]   right  . CHOLECYSTECTOMY  1996  . COLONOSCOPY    . CORONARY  ANGIOPLASTY WITH STENT PLACEMENT  2000   RCA stent, Dr. Rollene Fare; denies stent for PAD 06/15/13  . CORONARY ANGIOPLASTY WITH STENT PLACEMENT  04/1998  . CRANIOTOMY  1999   to clip aneurism, Dr. Annette Stable  . DG BIOPSY LUNG    . ESOPHAGOGASTRODUODENOSCOPY N/A 07/23/2012   Procedure: ESOPHAGOGASTRODUODENOSCOPY (EGD);  Surgeon: Jeryl Columbia, MD;  Location: [facility] ENDOSCOPY;  Service: Endoscopy;  Laterality: N/A;  buccini /ja  . GASTROSTOMY W/ FEEDING TUBE  2008   Dr. Watt Climes; only had for a few months  . HAND SURGERY  1989   crushed thumb, Dr. Fredna Dow  . PROSTATECTOMY  04/2001   removal of prostate cancer, Dr. Janice Norrie  . SPINE SURGERY    . [place]   left arm, Dr. Fredna Dow    Social History:  Social History   Social History  . Marital status: Married    Spouse name: N/A  . Number of children: N/A  . Years of education: N/A   Occupational History  . Not on file.   Social History Main Topics  . Smoking status: Former Smoker    Packs/day: 1.50    Years: 52.00    Types: Cigarettes    Quit date: 07/17/2012  . Smokeless tobacco: Never Used     Comment: Former smoker  . Alcohol use No  . Drug use:     Types: Mescaline  . Sexual activity: Yes   Other Topics Concern  . Not on file   Social History Narrative  . No narrative on file    Family History: Family History  Problem Relation Age of Onset  . Heart disease Mother   . Cancer Father     brain cancer and prostate cancer     ALLERGIES:  is allergic to lisinopril; lamictal [lamotrigine]; and ambien [zolpidem].  Meds: Current Outpatient Prescriptions  Medication Sig Dispense Refill  . ARIPiprazole (ABILIFY) 10 MG tablet Take 5 mg by mouth daily.     . Ascorbic Acid (VITAMIN C) 1000 MG tablet Take 1,000 mg by mouth daily.    Marland Kitchen atorvastatin (LIPITOR) 20 MG tablet Take 1 tablet (20 mg total) by mouth daily at 6 PM. 30 tablet 6  . budesonide (PULMICORT) 0.25 MG/2ML nebulizer solution Take 4 mLs (0.5 mg total) by nebulization 2  (two) times daily. 60 mL 3  . cholecalciferol (VITAMIN D) 1000 UNITS tablet Take 1,000 Units by mouth every morning.     . dabigatran (PRADAXA) 150 MG CAPS capsule Take 1 capsule (150 mg total) by mouth every 12 (twelve) hours. 60 capsule 6  .  diazepam (VALIUM) 5 MG tablet Take 1 tablet (5 mg total) by mouth every 8 (eight) hours as needed for anxiety or muscle spasms. scheduled 30 tablet 0  . diltiazem (CARDIZEM CD) 240 MG 24 hr capsule Take 1 capsule (240 mg total) by mouth daily. 30 capsule 6  . divalproex (DEPAKOTE) 500 MG 24 hr tablet Take 1,000 mg by mouth at bedtime.     . docusate sodium (COLACE) 100 MG capsule Take 200 mg by mouth at bedtime.     . DULoxetine (CYMBALTA) 60 MG capsule Take 120 mg by mouth daily.     . feeding supplement, ENSURE ENLIVE, (ENSURE ENLIVE) LIQD Take 237 mLs by mouth 2 (two) times daily between meals. 237 mL 12  . fluticasone (FLONASE) 50 MCG/ACT nasal spray PLACE 2 SPRAYS INTO BOTH NOSTRILS DAILY. 16 g 2  . guaiFENesin (MUCINEX) 600 MG 12 hr tablet Take 600 mg by mouth as needed. Reported on 08/13/2015    . guaiFENesin (ROBITUSSIN) 100 MG/5ML SOLN Take 5 mLs (100 mg total) by mouth every 4 (four) hours as needed for cough or to loosen phlegm. 236 mL 0  . HYDROcodone-acetaminophen (NORCO) 10-325 MG per tablet Take 1 tablet by mouth every 6 (six) hours as needed for moderate pain. for pain  0  . ipratropium-albuterol (DUONEB) 0.5-2.5 (3) MG/3ML SOLN Take 3 mLs by nebulization every 6 (six) hours. 360 mL 3  . levofloxacin (LEVAQUIN) 500 MG tablet Take 1 tablet (500 mg total) by mouth daily. 14 tablet 0  . metoprolol (LOPRESSOR) 50 MG tablet Take 1 tablet (50 mg total) by mouth 2 (two) times daily. 180 tablet 3  . montelukast (SINGULAIR) 10 MG tablet Take 1 tablet (10 mg total) by mouth at bedtime. 90 tablet 1  . nitroGLYCERIN (NITROSTAT) 0.4 MG SL tablet PLACE 1 TABLET (0.4 MG TOTAL) UNDER THE TONGUE EVERY 5 (FIVE) MINUTES AS NEEDED FOR CHEST PAIN. 25 tablet 2  .  omeprazole (PRILOSEC) 40 MG capsule Take 40 mg by mouth 2 (two) times daily.     . polyethylene glycol (MIRALAX / GLYCOLAX) packet Take 17 g by mouth daily. For constipation    . predniSONE (DELTASONE) 10 MG tablet Take 1 tablet (10 mg total) by mouth daily with breakfast. 30 tablet 0  . primidone (MYSOLINE) 250 MG tablet TAKE 1 TABLET (250 MG TOTAL) BY MOUTH 2 (TWO) TIMES DAILY. 180 tablet 1  . promethazine (PHENERGAN) 25 MG tablet Take 25 mg by mouth every 6 (six) hours as needed for nausea or vomiting. Reported on 04/06/2015  1  . ranitidine (ZANTAC) 150 MG tablet Take 150-300 mg by mouth 2 (two) times daily as needed for heartburn.    Marland Kitchen tiZANidine (ZANAFLEX) 4 MG tablet TAKE 1 TABLET BY MOUTH EVERY 8 HOURS AS NEEDED FOR MUSCLE SPASMS. 90 tablet 1  . Zinc 50 MG TABS Take 50 mg by mouth daily.      No current facility-administered medications for this encounter.     Physical Findings:  weight is 158 lb (71.7 kg). His blood pressure is 145/96 (abnormal) and his pulse is 89. His respiration is 18 and oxygen saturation is 91%.   In general this is a well appearing Caucasian male in no acute distress. HE is alert and oriented x4 and appropriate throughout the examination. HEENT reveals that the patient is normocephalic, atraumatic. EOMs are intact. PERRLA. Skin is intact without any evidence of gross lesions. Cardiovascular exam reveals a regular rate and rhythm, no clicks rubs or  murmurs are auscultated. Chest is clear to auscultation bilaterally. Lymphatic assessment is performed and does not reveal any adenopathy in the cervical, supraclavicular, axillary, or inguinal chains. Abdomen has active bowel sounds in all quadrants and is intact. The abdomen is soft, non tender, non distended. Lower extremities are negative for pretibial pitting edema, deep calf tenderness, cyanosis or clubbing.  Lab Findings: Lab Results  Component Value Date   WBC 7.6 08/15/2015   HGB 11.2 (L) 08/15/2015   HCT 34.8  (L) 08/15/2015   PLT 467.0 (H) 08/15/2015   PLT 329 12/10/2009    Lab Results  Component Value Date   NA 137 09/25/2015   K 5.2 (H) 09/25/2015   CO2 27 09/25/2015   GLUCOSE 96 09/25/2015   GLUCOSE 88 12/10/2009   BUN 19 09/25/2015   CREATININE 0.85 09/25/2015   CREATININE 0.65 10/11/2013   BILITOT 0.6 08/02/2015   ALKPHOS 53 08/02/2015   AST 18 08/02/2015   ALT 13 (L) 08/02/2015   PROT 8.5 (H) 08/02/2015   ALBUMIN 3.4 (L) 08/02/2015   CALCIUM 9.4 09/25/2015   ANIONGAP 8 08/09/2015    Radiographic Findings: Ct Chest Wo Contrast  Result Date: 10/16/2015 CLINICAL DATA:  Lung cancer diagnosed 2016. Prostate cancer diagnosed 2003. EXAM: CT CHEST WITHOUT CONTRAST TECHNIQUE: Multidetector CT imaging of the chest was performed following the standard protocol without IV contrast. COMPARISON:  CT 04/16/2015 FINDINGS: Cardiovascular: Coronary artery calcification and aortic atherosclerotic calcification. Mediastinum/Nodes: No axillary supraclavicular adenopathy. No mediastinal hilar adenopathy. Lungs/Pleura: Band of parenchymal thickening in the RIGHT lower lobe the region the treatment site is more prominent and slightly more thickened than prior. No discrete measurable pulmonary nodule present. There is new central consolidation in the medial segment of the RIGHT middle lobe (image 133, series 5). Improved consolidation in the medial LEFT lower lobe. Upper Abdomen: Limited view of the liver, kidneys, pancreas are unremarkable. Normal adrenal glands. Musculoskeletal: Posterior thoracic lumbar fusion. Severe degenerate change. Posterior decompression. No interval change or aggressive findings IMPRESSION: 1. Increased band of parenchymal thickening in the RIGHT lower lobe at treatment site is likely related to radiation change. 2. No clear evidence of lung cancer recurrence or progression. 3. Waxing and waning consolidation in the RIGHT middle lobe and LEFT lower lobe consistent with aspiration or  infection. Electronically Signed   By: Suzy Bouchard M.D.   On: 10/16/2015 16:20    Impression/Plan: 1. Stage IA, T1a, N0, M0, NSCLC, adenocarcinoma of the RLL. The patient continues to be NED from his treatment and will follow up with [place] in 6 months for a repeat CT scan of the chest. He will keep [place] informed of any questions or concerns that arise prior. 2. Intermittent pneumonia. The patient continues to have difficulties with mucous production and pneumonia and will continue his Prednisone taper per pulmonology, and percussion vest to help mobilize this and decrease his risks of recurrent pneumonia. 3. Protein Calorie Malnutrition. The patient appears to be doing much better with his PO intake, and his weight is consistent. We will follow this expectantly.    Carola Rhine, PAC

## 2015-10-18 NOTE — Progress Notes (Signed)
Patient returns today accompanied by wife to review chest CT. Weight and vitals stable. Reports chronic back pain 3 on a scale of 0-10 which is no worse. Reports frequency of dizziness is less. Reports he was hospitalized in May and June for pneumonia. Reports he continues to battle pneumonia and must wear a percussion vest twice a day. Reports a productive cough with yellow to green sputum. Reports SOB continues. Reports continued difficulty swallowing certain foods but, no associated pain with swallowing. Reports he continues to supplement his diet with one Ensure can per day. Reports persistent nausea. Reports diplopia. Explains he was seen twice by his eye doctor for diplopia and it seems to be improving. Yesterday had teeth removed in preparation for a partial thus, left cheek edema noted.   BP (!) 145/96 (BP Location: Left Arm, Patient Position: Sitting, Cuff Size: Normal)   Pulse 89   Resp 18   Wt 158 lb (71.7 kg)   SpO2 91%   BMI 22.67 kg/m  Wt Readings from Last 3 Encounters:  10/18/15 158 lb (71.7 kg)  09/13/15 157 lb 9.6 oz (71.5 kg)  09/11/15 158 lb 12.8 oz (72 kg)

## 2015-10-24 ENCOUNTER — Telehealth: Payer: Self-pay | Admitting: *Deleted

## 2015-10-24 DIAGNOSIS — S22000D Wedge compression fracture of unspecified thoracic vertebra, subsequent encounter for fracture with routine healing: Secondary | ICD-10-CM | POA: Diagnosis not present

## 2015-10-24 NOTE — Telephone Encounter (Signed)
Pradaxa samples placed at the front desk per call from patients wife.

## 2015-10-30 ENCOUNTER — Other Ambulatory Visit: Payer: Self-pay | Admitting: Family Medicine

## 2015-11-01 ENCOUNTER — Ambulatory Visit (INDEPENDENT_AMBULATORY_CARE_PROVIDER_SITE_OTHER): Payer: Medicare Other | Admitting: Interventional Cardiology

## 2015-11-01 ENCOUNTER — Encounter: Payer: Self-pay | Admitting: Interventional Cardiology

## 2015-11-01 VITALS — BP 114/66 | HR 79

## 2015-11-01 DIAGNOSIS — J449 Chronic obstructive pulmonary disease, unspecified: Secondary | ICD-10-CM | POA: Diagnosis not present

## 2015-11-01 DIAGNOSIS — I4892 Unspecified atrial flutter: Secondary | ICD-10-CM

## 2015-11-01 DIAGNOSIS — I2510 Atherosclerotic heart disease of native coronary artery without angina pectoris: Secondary | ICD-10-CM

## 2015-11-01 DIAGNOSIS — Z7901 Long term (current) use of anticoagulants: Secondary | ICD-10-CM | POA: Insufficient documentation

## 2015-11-01 DIAGNOSIS — F310 Bipolar disorder, current episode hypomanic: Secondary | ICD-10-CM

## 2015-11-01 DIAGNOSIS — I10 Essential (primary) hypertension: Secondary | ICD-10-CM | POA: Diagnosis not present

## 2015-11-01 DIAGNOSIS — I2584 Coronary atherosclerosis due to calcified coronary lesion: Secondary | ICD-10-CM | POA: Diagnosis not present

## 2015-11-01 MED ORDER — DABIGATRAN ETEXILATE MESYLATE 150 MG PO CAPS: 150.0000 mg | ORAL_CAPSULE | Freq: Two times a day (BID) | ORAL | 11 refills | Status: DC

## 2015-11-01 NOTE — Patient Instructions (Signed)
Medication Instructions:  Your physician recommends that you continue on your current medications as directed. Please refer to the Current Medication list given to you today.   Labwork: None Ordered   Testing/Procedures: None Ordered   Follow-Up: Your physician wants you to follow-up in: 6 months with Dr. Tamala Julian. You will receive a reminder letter in the mail two months in advance. If you don't receive a letter, please call our office to schedule the follow-up appointment.  Any Other Special Instructions Will Be Listed Below (If Applicable).     If you need a refill on your cardiac medications before your next appointment, please call your pharmacy.

## 2015-11-01 NOTE — Progress Notes (Signed)
Cardiology Office Note    Date:  11/01/2015   ID:  Malik Zuniga, DOB 11/29/1945, MRN [id number]  PCP:  [name], MD  Cardiologist: Sinclair Grooms, MD   Chief Complaint  Patient presents with  . Atrial Fibrillation    History of Present Illness:  Malik Zuniga is a 70 y.o. 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 Patient has history of mild LV dysfunction with combined systolic and diastolic heart failure. He underwent a nuclear medicine perfusion scan in May of this year that showed no significant evidence of ischemia. Recent development of atrial flutter during hospital stay for decompensated pulmonary condition. Anticoagulation therapy with Pradaxa was started. No bleeding complications to this point.  Malik Zuniga is doing well. He has had no bleeding on Pradaxa. Both he and his wife are interested in whether he needs long-term anticoagulation therapy. He has had a prior stroke. He denies cardiac complaints. He has chronic dyspnea. He has not had syncope.   Past Medical History:  Diagnosis Date  . Anemia associated with acute blood loss   . Angioedema 10/31/2010  . Anxiety    takes Valium daily  . Aspiration pneumonia ([place]) 2010  . Asthma   . Bacteremia due to Pseudomonas 10/04/2011   D/t gram neg rods   . 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 Chronic back pain    compression fracture  . Constipation    takes Colace daily as well as Miralax  . Coronary artery disease   . Depression    takes Cymbalta daily  . Emphysema of lung (HCC)    Albuterol as needed;Symbicort daily and Singulair at bedtime  . GERD (gastroesophageal reflux disease)    takes Omeprazole daily  . Headache, chronic daily   . History of bronchitis   . History of colon polyps   .  History of migraine    last migraine a couple of days ago;takes Excedrin Migraine  . History of prostate cancer 2004  . Hyperlipidemia    takes Simvastatin daily  . Hypertension    takes Metoprolol daily as well as Hyzaar  . Insomnia    takes Benadryl nightly  . Lung cancer ([place]) 2016  . Mood change ([place])    after Brain surgery mood changed and was placed on Depakote  . Myocardial infarction ([place]) 04/1998  . Neuromuscular disorder ([place]) 1998   right carpal tunnel release  . On home O2   . Shortness of breath    with exertion  . Stroke [facility]) 1998   Brain Aneurysm  . Tremor     Past Surgical History:  Procedure Laterality Date  . BACK SURGERY  08/2004; 02/2005; 04/2006; 06/2007; 7/20101   all by Dr. Annette Stable  . BACK SURGERY  05/2013  . BRAIN SURGERY  1999   clip aneurysm  . [place]   right  . CHOLECYSTECTOMY  1996  . COLONOSCOPY    . CORONARY ANGIOPLASTY WITH STENT PLACEMENT  2000   RCA stent, Dr. Rollene Fare; denies stent for PAD 06/15/13  . CORONARY ANGIOPLASTY WITH STENT PLACEMENT  04/1998  . CRANIOTOMY  1999   to clip aneurism, Dr. Annette Stable  . DG BIOPSY LUNG    . ESOPHAGOGASTRODUODENOSCOPY N/A 07/23/2012   Procedure:  ESOPHAGOGASTRODUODENOSCOPY (EGD);  Surgeon: Jeryl Columbia, MD;  Location: [facility] ENDOSCOPY;  Service: Endoscopy;  Laterality: N/A;  buccini /ja  . GASTROSTOMY W/ FEEDING TUBE  2008   Dr. Watt Climes; only had for a few months  . HAND SURGERY  1989   crushed thumb, Dr. Fredna Dow  . PROSTATECTOMY  04/2001   removal of prostate cancer, Dr. Janice Norrie  . SPINE SURGERY    . [place]   left arm, Dr. Fredna Dow    Current Medications: Outpatient Medications Prior to Visit  Medication Sig Dispense Refill  . ARIPiprazole (ABILIFY) 10 MG tablet Take 5 mg by mouth daily.     . Ascorbic Acid (VITAMIN C) 1000 MG tablet Take 1,000 mg by mouth daily.    Marland Kitchen atorvastatin (LIPITOR) 20 MG tablet Take 1 tablet (20 mg total) by mouth daily at 6 PM. 30 tablet 6  . budesonide  (PULMICORT) 0.25 MG/2ML nebulizer solution Take 4 mLs (0.5 mg total) by nebulization 2 (two) times daily. 60 mL 3  . cholecalciferol (VITAMIN D) 1000 UNITS tablet Take 1,000 Units by mouth every morning.     . diazepam (VALIUM) 5 MG tablet Take 1 tablet (5 mg total) by mouth every 8 (eight) hours as needed for anxiety or muscle spasms. scheduled 30 tablet 0  . diltiazem (CARDIZEM CD) 240 MG 24 hr capsule Take 1 capsule (240 mg total) by mouth daily. 30 capsule 6  . divalproex (DEPAKOTE) 500 MG 24 hr tablet Take 1,000 mg by mouth at bedtime.     . docusate sodium (COLACE) 100 MG capsule Take 200 mg by mouth at bedtime.     . DULoxetine (CYMBALTA) 60 MG capsule Take 120 mg by mouth daily.     . feeding supplement, ENSURE ENLIVE, (ENSURE ENLIVE) LIQD Take 237 mLs by mouth 2 (two) times daily between meals. 237 mL 12  . fluticasone (FLONASE) 50 MCG/ACT nasal spray PLACE 2 SPRAYS INTO BOTH NOSTRILS DAILY. 16 g 2  . guaiFENesin (MUCINEX) 600 MG 12 hr tablet Take 600 mg by mouth as needed. Reported on 08/13/2015    . guaiFENesin (ROBITUSSIN) 100 MG/5ML SOLN Take 5 mLs (100 mg total) by mouth every 4 (four) hours as needed for cough or to loosen phlegm. 236 mL 0  . HYDROcodone-acetaminophen (NORCO) 10-325 MG per tablet Take 1 tablet by mouth every 6 (six) hours as needed for moderate pain. for pain  0  . ipratropium-albuterol (DUONEB) 0.5-2.5 (3) MG/3ML SOLN Take 3 mLs by nebulization every 6 (six) hours. 360 mL 3  . metoprolol (LOPRESSOR) 50 MG tablet Take 1 tablet (50 mg total) by mouth 2 (two) times daily. 180 tablet 3  . montelukast (SINGULAIR) 10 MG tablet TAKE 1 TABLET AT BEDTIME 90 tablet 1  . nitroGLYCERIN (NITROSTAT) 0.4 MG SL tablet PLACE 1 TABLET (0.4 MG TOTAL) UNDER THE TONGUE EVERY 5 (FIVE) MINUTES AS NEEDED FOR CHEST PAIN. 25 tablet 2  . omeprazole (PRILOSEC) 40 MG capsule Take 40 mg by mouth 2 (two) times daily.     . polyethylene glycol (MIRALAX / GLYCOLAX) packet Take 17 g by mouth daily. For  constipation    . predniSONE (DELTASONE) 10 MG tablet Take 1 tablet (10 mg total) by mouth daily with breakfast. 30 tablet 0  . primidone (MYSOLINE) 250 MG tablet TAKE 1 TABLET TWICE DAILY 180 tablet 1  . promethazine (PHENERGAN) 25 MG tablet Take 25 mg by mouth every 6 (six) hours as needed for nausea or vomiting.  Reported on 04/06/2015  1  . ranitidine (ZANTAC) 150 MG tablet Take 150-300 mg by mouth 2 (two) times daily as needed for heartburn.    Marland Kitchen tiZANidine (ZANAFLEX) 4 MG tablet TAKE 1 TABLET BY MOUTH EVERY 8 HOURS AS NEEDED FOR MUSCLE SPASMS. 90 tablet 1  . Zinc 50 MG TABS Take 50 mg by mouth daily.     . dabigatran (PRADAXA) 150 MG CAPS capsule Take 1 capsule (150 mg total) by mouth every 12 (twelve) hours. 60 capsule 6  . levofloxacin (LEVAQUIN) 500 MG tablet Take 1 tablet (500 mg total) by mouth daily. (Patient not taking: Reported on 11/01/2015) 14 tablet 0   No facility-administered medications prior to visit.      Allergies:   Lisinopril; Lamictal [lamotrigine]; and Ambien [zolpidem]   Social History   Social History  . Marital status: Married    Spouse name: N/A  . Number of children: N/A  . Years of education: N/A   Social History Main Topics  . Smoking status: Former Smoker    Packs/day: 1.50    Years: 52.00    Types: Cigarettes    Quit date: 07/17/2012  . Smokeless tobacco: Never Used     Comment: Former smoker  . Alcohol use No  . Drug use:     Types: Mescaline  . Sexual activity: Yes   Other Topics Concern  . None   Social History Narrative  . None     Family History:  The patient's family history includes Cancer in his father; Heart disease in his mother.   ROS:   Please see the history of present illness.    Decreased appetite, otherwise unremarkable.  All other systems reviewed and are negative.   PHYSICAL EXAM:   VS:  BP 114/66   Pulse 79    GEN: Well nourished, well developed, in no acute distress  HEENT: normal  Neck: no JVD, carotid bruits,  or masses Cardiac: RRR; no murmurs, rubs, or gallops,no edema  Respiratory:  clear to auscultation bilaterally, normal work of breathing GI: soft, nontender, nondistended, + BS MS: no deformity or atrophy  Skin: warm and dry, no rash Neuro:  Alert and Oriented x 3, Strength and sensation are intact Psych: euthymic mood, full affect  Wt Readings from Last 3 Encounters:  10/18/15 158 lb (71.7 kg)  09/13/15 157 lb 9.6 oz (71.5 kg)  09/11/15 158 lb 12.8 oz (72 kg)      Studies/Labs Reviewed:   EKG:  EKG  Not repeated  Recent Labs: 06/05/2015: TSH 0.57 06/25/2015: B Natriuretic Peptide 36.3 08/02/2015: ALT 13 08/04/2015: Magnesium 1.8 08/15/2015: Hemoglobin 11.2; Platelets 467.0 09/25/2015: BUN 19; Creatinine, Ser 0.85; Potassium 5.2; Sodium 137   Lipid Panel    Component Value Date/Time   CHOL 249 (H) 06/05/2015 1119   TRIG 54.0 06/05/2015 1119   HDL 114.60 06/05/2015 1119   CHOLHDL 2 06/05/2015 1119   VLDL 10.8 06/05/2015 1119   LDLCALC 124 (H) 06/05/2015 1119   LDLDIRECT 112.3 04/22/2012 1127    Additional studies/ records that were reviewed today include:  There is no new data.    ASSESSMENT:    1. Coronary artery disease involving native coronary artery of native heart without angina pectoris   2. Atrial flutter with rapid ventricular response ([place])   3. Essential hypertension   4. COPD     5. Bipolar affective disorder, current episode hypomanic ([place])   6. Chronic anticoagulation      PLAN:  In  order of problems listed above:  1. Asymptomatic with reference to angina. 2. Long discussion concerning atrial flutter and his individual stroke risk. We have made a decision to continue long-term anticoagulation given his risk profile.CHADS VASC is 5. He has had a prior stroke. 3. Good blood pressure control at this time. 4. Still smokes. COPD is stable this morning. 5. Not addressed 6. No bleeding complications on Pred accident.    Medication Adjustments/Labs  and Tests Ordered: Current medicines are reviewed at length with the patient today.  Concerns regarding medicines are outlined above.  Medication changes, Labs and Tests ordered today are listed in the Patient Instructions below. Patient Instructions  Medication Instructions:  Your physician recommends that you continue on your current medications as directed. Please refer to the Current Medication list given to you today.   Labwork: None Ordered   Testing/Procedures: None Ordered   Follow-Up: Your physician wants you to follow-up in: 6 months with Dr. Tamala Julian. You will receive a reminder letter in the mail two months in advance. If you don't receive a letter, please call our office to schedule the follow-up appointment.  Any Other Special Instructions Will Be Listed Below (If Applicable).     If you need a refill on your cardiac medications before your next appointment, please call your pharmacy.      Signed, Sinclair Grooms, MD  11/01/2015 1:12 PM    [facility] [address], [place], [place]  [address] Phone: [phone]; Fax: [phone]

## 2015-11-02 ENCOUNTER — Other Ambulatory Visit: Payer: Self-pay | Admitting: Pulmonary Disease

## 2015-11-07 ENCOUNTER — Encounter: Payer: Self-pay | Admitting: Pulmonary Disease

## 2015-11-07 ENCOUNTER — Ambulatory Visit (INDEPENDENT_AMBULATORY_CARE_PROVIDER_SITE_OTHER): Payer: Medicare Other | Admitting: Pulmonary Disease

## 2015-11-07 DIAGNOSIS — I2584 Coronary atherosclerosis due to calcified coronary lesion: Secondary | ICD-10-CM | POA: Diagnosis not present

## 2015-11-07 DIAGNOSIS — J479 Bronchiectasis, uncomplicated: Secondary | ICD-10-CM

## 2015-11-07 DIAGNOSIS — I2510 Atherosclerotic heart disease of native coronary artery without angina pectoris: Secondary | ICD-10-CM | POA: Diagnosis not present

## 2015-11-07 NOTE — Assessment & Plan Note (Signed)
[name] looks great today. I think this is in large part to his regular use of nebulizers in the therapy vest. His lungs sound clear today. He also feels strongly that the addition of prednisone has made a difference. I have to agree with him.  Is much as I hate to continue long-term prednisone, of never seen Malik Zuniga look this good for this long. We talked today at length about the long-term risks of using this medicine including fracture, glaucoma, blood sugar risk mental status changes. Despite this he would like to continue.  Plan: Continue low-dose prednisone 5 mg daily for the next 2 months, if stable and plan slow taper off Continue therapy vest twice a day Continue bronchodilators twice a day

## 2015-11-07 NOTE — Patient Instructions (Signed)
Keep taking prednisone 5 mg daily until you see me next in 2 months Keep taking your other medicines as you were doing Keep using the therapy vest as you are doing

## 2015-11-07 NOTE — Progress Notes (Signed)
Subjective:    Patient ID: Malik Zuniga, male    DOB: 1945/10/19, 70 y.o.   MRN: [id number]  Synopsis: former patient of Dr. 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   [name] 04/2009:  FEV1 1.65 (47%), FEV1% 49  CT chest 10/2013:  RLL infiltrates CXR 10/2013:  LLL infiltrate +h/o aspiration in the past.  +speech therapy evaluation with recs given.  Admitted multiple times in 2017 for recurrent aspiration pneumonia. Started therapy vest 2017  HPI  Chief Complaint  Patient presents with  . Follow-up    pt still on '10mg'$  prednisone daily, doing very well on this dosage.  Pt also using vest and nebulizers daily.     Malik Zuniga is feeling great and he is using the vest and his nebulizers frequently.  He is taking the prednisone.  Breathing has been better. He is using the vest twice a day and he produces some mucus afterwards.    Past Medical History:  Diagnosis Date  . Anemia associated with acute blood loss   . Angioedema 10/31/2010  . Anxiety    takes Valium daily  . Aspiration pneumonia ([place]) 2010  . Asthma   . Bacteremia due to Pseudomonas 10/04/2011   D/t gram neg rods   . 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 Chronic back pain    compression fracture  . Constipation    takes Colace daily as well as Miralax  . Coronary artery disease   . Depression    takes Cymbalta daily  . Emphysema of lung (HCC)    Albuterol as needed;Symbicort daily and Singulair at bedtime  . GERD (gastroesophageal reflux disease)    takes Omeprazole daily  . Headache, chronic daily   . History of bronchitis   . History of colon polyps   . History of migraine    last migraine a couple of days ago;takes Excedrin Migraine  . History of prostate cancer 2004  . Hyperlipidemia    takes Simvastatin daily  . Hypertension    takes Metoprolol  daily as well as Hyzaar  . Insomnia    takes Benadryl nightly  . Lung cancer ([place]) 2016  . Mood change ([place])    after Brain surgery mood changed and was placed on Depakote  . Myocardial infarction ([place]) 04/1998  . Neuromuscular disorder ([place]) 1998   right carpal tunnel release  . On home O2   . Shortness of breath    with exertion  . Stroke [facility]) 1998   Brain Aneurysm  . Tremor         Review of Systems  Constitutional: Negative for chills, fatigue and fever.  HENT: Negative for postnasal drip, rhinorrhea and sinus pressure.   Respiratory: Positive for cough and shortness of breath. Negative for wheezing.        Objective:   Physical Exam Vitals:   11/07/15 1005  BP: 124/76  Pulse: 76  SpO2: 91%   RA  Gen: chronically ill appearing, in wheelchair HENT: OP clear, TM's clear, neck supple PULM: CTA B, normal percussion and effort CV: RRR, no mgr, trace edema GI: BS+, soft, nontender Derm: no cyanosis or rash Psyche: normal mood and affect Neuro: strength 4+/5 bilaterally grip strength   The most recent records from our clinic reviewed today  Assessment & Plan:  Bronchiectasis without acute exacerbation [facility]) Malik Zuniga looks great today. I think this is in large part to his regular use of nebulizers in the therapy vest. His lungs sound clear today. He also feels strongly that the addition of prednisone has made a difference. I have to agree with him.  Is much as I hate to continue long-term prednisone, of never seen Malik Zuniga look this good for this long. We talked today at length about the long-term risks of using this medicine including fracture, glaucoma, blood sugar risk mental status changes. Despite this he would like to continue.  Plan: Continue low-dose prednisone 5 mg daily for the next 2 months, if stable and plan slow taper off Continue therapy vest twice a day Continue bronchodilators twice a day    Current Outpatient Prescriptions:  .   ARIPiprazole (ABILIFY) 10 MG tablet, Take 5 mg by mouth daily. , Disp: , Rfl:  .  Ascorbic Acid (VITAMIN C) 1000 MG tablet, Take 1,000 mg by mouth daily., Disp: , Rfl:  .  atorvastatin (LIPITOR) 20 MG tablet, Take 1 tablet (20 mg total) by mouth daily at 6 PM., Disp: 30 tablet, Rfl: 6 .  budesonide (PULMICORT) 0.25 MG/2ML nebulizer solution, Take 4 mLs (0.5 mg total) by nebulization 2 (two) times daily., Disp: 60 mL, Rfl: 3 .  cholecalciferol (VITAMIN D) 1000 UNITS tablet, Take 1,000 Units by mouth every morning. , Disp: , Rfl:  .  dabigatran (PRADAXA) 150 MG CAPS capsule, Take 1 capsule (150 mg total) by mouth every 12 (twelve) hours., Disp: 60 capsule, Rfl: 11 .  diazepam (VALIUM) 5 MG tablet, Take 1 tablet (5 mg total) by mouth every 8 (eight) hours as needed for anxiety or muscle spasms. scheduled, Disp: 30 tablet, Rfl: 0 .  diltiazem (CARDIZEM CD) 240 MG 24 hr capsule, Take 1 capsule (240 mg total) by mouth daily., Disp: 30 capsule, Rfl: 6 .  divalproex (DEPAKOTE) 500 MG 24 hr tablet, Take 1,000 mg by mouth at bedtime. , Disp: , Rfl:  .  docusate sodium (COLACE) 100 MG capsule, Take 200 mg by mouth at bedtime. , Disp: , Rfl:  .  DULoxetine (CYMBALTA) 60 MG capsule, Take 120 mg by mouth daily. , Disp: , Rfl:  .  feeding supplement, ENSURE ENLIVE, (ENSURE ENLIVE) LIQD, Take 237 mLs by mouth 2 (two) times daily between meals., Disp: 237 mL, Rfl: 12 .  fluticasone (FLONASE) 50 MCG/ACT nasal spray, PLACE 2 SPRAYS INTO BOTH NOSTRILS DAILY., Disp: 16 g, Rfl: 2 .  guaiFENesin (MUCINEX) 600 MG 12 hr tablet, Take 600 mg by mouth as needed. Reported on 08/13/2015, Disp: , Rfl:  .  guaiFENesin (ROBITUSSIN) 100 MG/5ML SOLN, Take 5 mLs (100 mg total) by mouth every 4 (four) hours as needed for cough or to loosen phlegm., Disp: 236 mL, Rfl: 0 .  HYDROcodone-acetaminophen (NORCO) 10-325 MG per tablet, Take 1 tablet by mouth every 6 (six) hours as needed for moderate pain. for pain, Disp: , Rfl: 0 .   ipratropium-albuterol (DUONEB) 0.5-2.5 (3) MG/3ML SOLN, Take 3 mLs by nebulization every 6 (six) hours., Disp: 360 mL, Rfl: 3 .  metoprolol (LOPRESSOR) 50 MG tablet, Take 1 tablet (50 mg total) by mouth 2 (two) times daily., Disp: 180 tablet, Rfl: 3 .  montelukast (SINGULAIR) 10 MG tablet, TAKE 1 TABLET AT BEDTIME, Disp: 90 tablet, Rfl: 1 .  nitroGLYCERIN (NITROSTAT) 0.4 MG SL tablet, PLACE 1 TABLET (0.4 MG TOTAL) UNDER THE TONGUE EVERY 5 (  FIVE) MINUTES AS NEEDED FOR CHEST PAIN., Disp: 25 tablet, Rfl: 2 .  omeprazole (PRILOSEC) 40 MG capsule, Take 40 mg by mouth 2 (two) times daily. , Disp: , Rfl:  .  polyethylene glycol (MIRALAX / GLYCOLAX) packet, Take 17 g by mouth daily. For constipation, Disp: , Rfl:  .  predniSONE (DELTASONE) 10 MG tablet, TAKE 1 TABLET EVERY DAY WITH BREAKFAST, Disp: 30 tablet, Rfl: 1 .  primidone (MYSOLINE) 250 MG tablet, TAKE 1 TABLET TWICE DAILY, Disp: 180 tablet, Rfl: 1 .  promethazine (PHENERGAN) 25 MG tablet, Take 25 mg by mouth every 6 (six) hours as needed for nausea or vomiting. Reported on 04/06/2015, Disp: , Rfl: 1 .  ranitidine (ZANTAC) 150 MG tablet, Take 150-300 mg by mouth 2 (two) times daily as needed for heartburn., Disp: , Rfl:  .  tiZANidine (ZANAFLEX) 4 MG tablet, TAKE 1 TABLET BY MOUTH EVERY 8 HOURS AS NEEDED FOR MUSCLE SPASMS., Disp: 90 tablet, Rfl: 1 .  Zinc 50 MG TABS, Take 50 mg by mouth daily. , Disp: , Rfl:

## 2015-11-10 ENCOUNTER — Other Ambulatory Visit: Payer: Self-pay | Admitting: Interventional Cardiology

## 2015-11-12 ENCOUNTER — Other Ambulatory Visit: Payer: Self-pay | Admitting: *Deleted

## 2015-11-12 ENCOUNTER — Encounter: Payer: Self-pay | Admitting: *Deleted

## 2015-11-12 NOTE — Patient Outreach (Deleted)
ABDOMINAL PAIN Location:  Onset:  Description:  Modifying factors:    Symptoms Nausea/Vomiting:  Diarrhea:  Constipation:  Melena/BRBPR:  Hematemesis:  Anorexia:  Fever/Chills:  Jaundice:  Dysuria:  Back pain:  Rash:  Weight loss:  Vaginal bleeding:  STD exposure:  LMP:  Alcohol use:  NSAID use:   PMH Past Surgeries:

## 2015-11-12 NOTE — Patient Outreach (Signed)
[facility]) Care Management  11/12/2015   Malik Zuniga 10/23/1945 [id number]  Subjective: RN Health Coach telephone call to patient.  Hipaa compliance verified. Per wife they are having a little problem today. The patient dog ate through the cord on the respiratory therapy vest. The wife has called [facility], [facility], [place] and  they do not have a replacement cord. [facility] does not have a loaner.  RN called Dr [name] office and they do not have a loaner vest. Patient is doing the best he has done in a long time but without the vest wife is afraid.  Patient may  have to return to hospital with complications.  Patient does have shortness of breath on exertion. Patient is using the supplemental nutritional coupons and wife stated she has used her last one. Patient and wife agreed to follow up outreach calls.    Objective:   Current Medications:  Current Outpatient Prescriptions  Medication Sig Dispense Refill  . ARIPiprazole (ABILIFY) 10 MG tablet Take 5 mg by mouth daily.     . Ascorbic Acid (VITAMIN C) 1000 MG tablet Take 1,000 mg by mouth daily.    Marland Kitchen atorvastatin (LIPITOR) 20 MG tablet Take 1 tablet (20 mg total) by mouth daily at 6 PM. 30 tablet 6  . budesonide (PULMICORT) 0.25 MG/2ML nebulizer solution Take 4 mLs (0.5 mg total) by nebulization 2 (two) times daily. 60 mL 3  . cholecalciferol (VITAMIN D) 1000 UNITS tablet Take 1,000 Units by mouth every morning.     . dabigatran (PRADAXA) 150 MG CAPS capsule Take 1 capsule (150 mg total) by mouth every 12 (twelve) hours. 60 capsule 11  . diazepam (VALIUM) 5 MG tablet Take 1 tablet (5 mg total) by mouth every 8 (eight) hours as needed for anxiety or muscle spasms. scheduled 30 tablet 0  . diltiazem (CARDIZEM CD) 240 MG 24 hr capsule Take 1 capsule (240 mg total) by mouth daily. 30 capsule 6  . divalproex (DEPAKOTE) 500 MG 24 hr tablet Take 1,000 mg by mouth at bedtime.     . docusate sodium (COLACE) 100 MG capsule  Take 200 mg by mouth at bedtime.     . DULoxetine (CYMBALTA) 60 MG capsule Take 120 mg by mouth daily.     . feeding supplement, ENSURE ENLIVE, (ENSURE ENLIVE) LIQD Take 237 mLs by mouth 2 (two) times daily between meals. 237 mL 12  . fluticasone (FLONASE) 50 MCG/ACT nasal spray PLACE 2 SPRAYS INTO BOTH NOSTRILS DAILY. 16 g 2  . guaiFENesin (MUCINEX) 600 MG 12 hr tablet Take 600 mg by mouth as needed. Reported on 08/13/2015    . guaiFENesin (ROBITUSSIN) 100 MG/5ML SOLN Take 5 mLs (100 mg total) by mouth every 4 (four) hours as needed for cough or to loosen phlegm. 236 mL 0  . HYDROcodone-acetaminophen (NORCO) 10-325 MG per tablet Take 1 tablet by mouth every 6 (six) hours as needed for moderate pain. for pain  0  . ipratropium-albuterol (DUONEB) 0.5-2.5 (3) MG/3ML SOLN Take 3 mLs by nebulization every 6 (six) hours. 360 mL 3  . metoprolol (LOPRESSOR) 50 MG tablet Take 1 tablet (50 mg total) by mouth 2 (two) times daily. 180 tablet 3  . montelukast (SINGULAIR) 10 MG tablet TAKE 1 TABLET AT BEDTIME 90 tablet 1  . nitroGLYCERIN (NITROSTAT) 0.4 MG SL tablet PLACE 1 TABLET (0.4 MG TOTAL) UNDER THE TONGUE EVERY 5 (FIVE) MINUTES AS NEEDED FOR CHEST PAIN. 25 tablet 2  .  omeprazole (PRILOSEC) 40 MG capsule Take 40 mg by mouth 2 (two) times daily.     . polyethylene glycol (MIRALAX / GLYCOLAX) packet Take 17 g by mouth daily. For constipation    . predniSONE (DELTASONE) 10 MG tablet TAKE 1 TABLET EVERY DAY WITH BREAKFAST 30 tablet 1  . primidone (MYSOLINE) 250 MG tablet TAKE 1 TABLET TWICE DAILY 180 tablet 1  . promethazine (PHENERGAN) 25 MG tablet Take 25 mg by mouth every 6 (six) hours as needed for nausea or vomiting. Reported on 04/06/2015  1  . ranitidine (ZANTAC) 150 MG tablet Take 150-300 mg by mouth 2 (two) times daily as needed for heartburn.    Marland Kitchen tiZANidine (ZANAFLEX) 4 MG tablet TAKE 1 TABLET BY MOUTH EVERY 8 HOURS AS NEEDED FOR MUSCLE SPASMS. 90 tablet 1  . Zinc 50 MG TABS Take 50 mg by mouth daily.       No current facility-administered medications for this visit.     Functional Status:  In your present state of health, do you have any difficulty performing the following activities: 11/12/2015 08/21/2015  Hearing? [name]  Vision? Y Y  Difficulty concentrating or making decisions? [name]  Walking or climbing stairs? Y Y  Dressing or bathing? Y Y  Doing errands, shopping? [name]  Preparing Food and eating ? Y Y  Using the Toilet? N Y  In the past six months, have you accidently leaked urine? N N  Do you have problems with loss of bowel control? N N  Managing your Medications? Y Y  Managing your Finances? N N  Housekeeping or managing your Housekeeping? Y N  Some recent data might be hidden    Fall/Depression Screening: PHQ 2/9 Scores 11/12/2015 08/21/2015 06/07/2015 04/26/2015 03/30/2015 03/02/2015 12/29/2014  PHQ - 2 Score 1 3 0 '4 1 1 1  '$ PHQ- 9 Score - 15 - 17 - - -   THN CM Care Plan Problem One   Flowsheet Row Most Recent Value  Care Plan Problem One  knowledge deficit on self management of COPD  Role Documenting the Problem One  Health [place] for Problem One  Active  THN Long Term Goal (31-90 days)  patient will not have any readmissions within the next 90 days  THN Long Term Goal Start Date  10/12/15  Interventions for Problem One Long Term Goal  RN discussed importance of follow up care with PCP and pulmonologist. Patient reminded to take medications as per ordered. RN will follow up with monthly outreach  THN CM Short Term Goal #1 (0-30 days)  Patient will report receiving new cord for pulmonary therapy vest  THN CM Short Term Goal #1 Start Date  11/12/15  Interventions for Short Term Goal #1  Patient wife ordered new cord to be ariving Thursday. RN Health Coach checked with physician office, and other equipment agency. RN will follow up on delivery  THN CM Short Term Goal #2 (0-30 days)  Patient will report continuing nutritional supplements to maintain weightt  THN CM  Short Term Goal #2 Start Date  11/12/15  Interventions for Short Term Goal #2  RN sent nutritional supplement coupons to assist with intake. RN will follow up with patient weight maintaining      Assessment: Patient respiratory therapy vest has been damaged Patient will benefit from [place] telephonic outreach for education and support for diabetes self management.   Plan:  Replacement has been ordered for Respiratory Therapy Vest RN sent nutritional  supplemental coupons RN sent educational information on Bronchiectasis RN will send patient a 2017-2018 calendar book to record weight and appointments RN will follow up outreach within the month of November  Analys[facility] [place] Care Management [phone]

## 2015-11-16 ENCOUNTER — Encounter: Payer: Self-pay | Admitting: *Deleted

## 2015-11-28 ENCOUNTER — Other Ambulatory Visit: Payer: Self-pay | Admitting: *Deleted

## 2015-11-28 NOTE — Patient Outreach (Signed)
[facility]) Care Management  11/28/2015  Malik Zuniga Dec 28, 1945 [id number]    Telephone call from patient checking to see options for [facility] if wife goes into hospital. Patient is unable to put his respiratory vest on. RN Health Coach talked with community nurse to check options.  1. Patient can pay out of pocket for caregiver 2. Patient can check with PCP regarding [place] evaluation  [place] [facility] [phone]

## 2015-12-13 ENCOUNTER — Other Ambulatory Visit: Payer: Self-pay | Admitting: *Deleted

## 2015-12-13 ENCOUNTER — Encounter: Payer: Self-pay | Admitting: *Deleted

## 2015-12-13 ENCOUNTER — Telehealth: Payer: Self-pay | Admitting: Pulmonary Disease

## 2015-12-13 MED ORDER — LEVOFLOXACIN 500 MG PO TABS: 500.0000 mg | ORAL_TABLET | Freq: Every day | ORAL | 0 refills | Status: DC

## 2015-12-13 MED ORDER — PREDNISONE 10 MG PO TABS: ORAL_TABLET | ORAL | 0 refills | Status: DC

## 2015-12-13 NOTE — Telephone Encounter (Signed)
Levaquin '500mg'$  daily x 5 days Prednisone taper: Take '40mg'$  po daily for 3 days, then take '30mg'$  po daily for 3 days, then take '20mg'$  po daily for two days, then take '10mg'$  po daily for 2 days

## 2015-12-13 NOTE — Patient Outreach (Signed)
[facility]) Care Management  12/13/2015   Malik Zuniga October 20, 1945 [id number]  Subjective: RN Health Coach telephone call to patient.  Hipaa compliance verified. Per patient and wife. Patient is having and increase in cough. Sputum is turning dark. Patient has had 2 falls this morning with no noted injuries per wife. Patient is feeling weak.  Patient is also complaining of nausea and lack of appetite. Patient has been using his vest and inhalers as per ordered.   Objective:   Current Medications:  Current Outpatient Prescriptions  Medication Sig Dispense Refill  . ARIPiprazole (ABILIFY) 10 MG tablet Take 5 mg by mouth daily.     . Ascorbic Acid (VITAMIN C) 1000 MG tablet Take 1,000 mg by mouth daily.    Marland Kitchen atorvastatin (LIPITOR) 20 MG tablet Take 1 tablet (20 mg total) by mouth daily at 6 PM. 30 tablet 6  . budesonide (PULMICORT) 0.25 MG/2ML nebulizer solution Take 4 mLs (0.5 mg total) by nebulization 2 (two) times daily. 60 mL 3  . cholecalciferol (VITAMIN D) 1000 UNITS tablet Take 1,000 Units by mouth every morning.     . dabigatran (PRADAXA) 150 MG CAPS capsule Take 1 capsule (150 mg total) by mouth every 12 (twelve) hours. 60 capsule 11  . diazepam (VALIUM) 5 MG tablet Take 1 tablet (5 mg total) by mouth every 8 (eight) hours as needed for anxiety or muscle spasms. scheduled 30 tablet 0  . diltiazem (CARDIZEM CD) 240 MG 24 hr capsule Take 1 capsule (240 mg total) by mouth daily. 30 capsule 6  . divalproex (DEPAKOTE) 500 MG 24 hr tablet Take 1,000 mg by mouth at bedtime.     . docusate sodium (COLACE) 100 MG capsule Take 200 mg by mouth at bedtime.     . DULoxetine (CYMBALTA) 60 MG capsule Take 120 mg by mouth daily.     . feeding supplement, ENSURE ENLIVE, (ENSURE ENLIVE) LIQD Take 237 mLs by mouth 2 (two) times daily between meals. 237 mL 12  . fluticasone (FLONASE) 50 MCG/ACT nasal spray PLACE 2 SPRAYS INTO BOTH NOSTRILS DAILY. 16 g 2  . guaiFENesin (MUCINEX) 600  MG 12 hr tablet Take 600 mg by mouth as needed. Reported on 08/13/2015    . guaiFENesin (ROBITUSSIN) 100 MG/5ML SOLN Take 5 mLs (100 mg total) by mouth every 4 (four) hours as needed for cough or to loosen phlegm. 236 mL 0  . HYDROcodone-acetaminophen (NORCO) 10-325 MG per tablet Take 1 tablet by mouth every 6 (six) hours as needed for moderate pain. for pain  0  . ipratropium-albuterol (DUONEB) 0.5-2.5 (3) MG/3ML SOLN Take 3 mLs by nebulization every 6 (six) hours. 360 mL 3  . metoprolol (LOPRESSOR) 50 MG tablet Take 1 tablet (50 mg total) by mouth 2 (two) times daily. 180 tablet 3  . montelukast (SINGULAIR) 10 MG tablet TAKE 1 TABLET AT BEDTIME 90 tablet 1  . nitroGLYCERIN (NITROSTAT) 0.4 MG SL tablet PLACE 1 TABLET (0.4 MG TOTAL) UNDER THE TONGUE EVERY 5 (FIVE) MINUTES AS NEEDED FOR CHEST PAIN. 25 tablet 2  . omeprazole (PRILOSEC) 40 MG capsule Take 40 mg by mouth 2 (two) times daily.     . polyethylene glycol (MIRALAX / GLYCOLAX) packet Take 17 g by mouth daily. For constipation    . predniSONE (DELTASONE) 10 MG tablet TAKE 1 TABLET EVERY DAY WITH BREAKFAST 30 tablet 1  . primidone (MYSOLINE) 250 MG tablet TAKE 1 TABLET TWICE DAILY 180 tablet 1  . promethazine (PHENERGAN)  25 MG tablet Take 25 mg by mouth every 6 (six) hours as needed for nausea or vomiting. Reported on 04/06/2015  1  . ranitidine (ZANTAC) 150 MG tablet Take 150-300 mg by mouth 2 (two) times daily as needed for heartburn.    Marland Kitchen tiZANidine (ZANAFLEX) 4 MG tablet TAKE 1 TABLET BY MOUTH EVERY 8 HOURS AS NEEDED FOR MUSCLE SPASMS. 90 tablet 1  . Zinc 50 MG TABS Take 50 mg by mouth daily.      No current facility-administered medications for this visit.     Functional Status:  In your present state of health, do you have any difficulty performing the following activities: 12/13/2015 12/13/2015  Hearing? Malik Zuniga  Vision? Y Y  Difficulty concentrating or making decisions? Malik Zuniga  Walking or climbing stairs? Y Y  Dressing or bathing? Y Y   Doing errands, shopping? Malik Zuniga  Preparing Food and eating ? Y -  Using the Toilet? N -  In the past six months, have you accidently leaked urine? N -  Do you have problems with loss of bowel control? N -  Managing your Medications? Y -  Managing your Finances? N -  Housekeeping or managing your Housekeeping? Y -  Some recent data might be hidden    Fall/Depression Screening: PHQ 2/9 Scores 12/13/2015 11/12/2015 08/21/2015 06/07/2015 04/26/2015 03/30/2015 03/02/2015  PHQ - 2 Score _0 0 _1 PHQ- 9 Score - - 15 - 17 - -   THN CM Care Plan Problem One   Flowsheet Row Most Recent Value  Care Plan Problem One  knowledge deficit on self management of COPD  Role Documenting the Problem One  Health [place] for Problem One  Active  THN Long Term Goal (31-90 days)  patient will not have any readmissions within the next 90 days  Interventions for Problem One Long Term Goal  RN discussed importance of follow up care with PCP and pulmonologist. Patient reminded to take medications as per ordered. RN will follow up with monthly outreach  [facility] CM Short Term Goal #1 Met Date  12/13/15  THN CM Short Term Goal #3 (0-30 days)  patient will verbalize no additional falls within the next 30 days  THN CM Short Term Goal #3 Start Date  12/13/15  Interventions for Short Tern Goal #3  Patient encouraged to get up slowly. Call for assistance if he feels weak. Patient has received fall prevention information       Assessment:  Patient had dark productive cough Patient is very weak Patient has fallen twice today   Plan: RN instructed wife to call Dr immediately  RN sent nutritional coupons RN sent additional information on patient safety RN will follow up within the month of December   Malik Zuniga Care Management [phone]

## 2015-12-13 NOTE — Telephone Encounter (Signed)
Spoke with pt, thinks he has pna.  Pt c/o increased prod cough with lots of dark brown mucus, sinus congestion, pnd, increased SOB, decreased appetite  X3-4 days. Denies fever.  Notes his BP is normal.    Offered appt this afternoon with BQ- pt declined, stating that it is difficult for him to come this far, requesting an abx and pred taper.  Pt uses [facility] on [address].    BQ please advise on recs.  Thanks!

## 2015-12-13 NOTE — Telephone Encounter (Signed)
Spoke with pt and advised of Dr Anastasia Pall recommendation. RX sent to pharmacy. Nothing further needed.

## 2015-12-17 ENCOUNTER — Telehealth: Payer: Self-pay | Admitting: Pulmonary Disease

## 2015-12-17 NOTE — Telephone Encounter (Signed)
I wish I could see him but I am covering the ICU at [facility].  He needs to be seen and may even need admission.

## 2015-12-17 NOTE — Telephone Encounter (Signed)
Called and spoke to pt. Informed him of the recs per BQ. Appt made with PM on 12/18/15 at 915 (per pt's request to be seen on 12/18/15). Nothing further needed at this time.

## 2015-12-17 NOTE — Telephone Encounter (Signed)
Called and spoke to pt. Pt requesting more abx to be sent in for his s/s. Pt called in on 11.2.17 and was given Levaquin and pred taper and was c/o prod cough with brown mucus, sinus congestion, PND, increased SOB from baseline, and decreased appetite. Pt states he is not feeling better and requesting more abx. Advised pt that he needs to come in for visit because previous tx didn't help. Pt refused stating he only wants to see BQ. Reiterated importance for pt coming in to be seen, pt refused.   Dr. [name] please advise. Thanks.

## 2015-12-18 ENCOUNTER — Encounter: Payer: Self-pay | Admitting: Pulmonary Disease

## 2015-12-18 ENCOUNTER — Inpatient Hospital Stay (HOSPITAL_COMMUNITY): Payer: Medicare Other

## 2015-12-18 ENCOUNTER — Inpatient Hospital Stay (HOSPITAL_COMMUNITY)
Admission: AD | Admit: 2015-12-18 | Discharge: 2015-12-20 | DRG: 191 | Disposition: A | Discharge status: Home / Self Care | Payer: Medicare Other | Source: Ambulatory Visit | Attending: Pulmonary Disease | Admitting: Pulmonary Disease

## 2015-12-18 ENCOUNTER — Ambulatory Visit (INDEPENDENT_AMBULATORY_CARE_PROVIDER_SITE_OTHER): Payer: Medicare Other | Admitting: Pulmonary Disease

## 2015-12-18 ENCOUNTER — Encounter (HOSPITAL_COMMUNITY): Payer: Self-pay

## 2015-12-18 VITALS — BP 112/68 | HR 78

## 2015-12-18 DIAGNOSIS — Z9981 Dependence on supplemental oxygen: Secondary | ICD-10-CM

## 2015-12-18 DIAGNOSIS — J479 Bronchiectasis, uncomplicated: Secondary | ICD-10-CM | POA: Diagnosis not present

## 2015-12-18 DIAGNOSIS — J471 Bronchiectasis with (acute) exacerbation: Secondary | ICD-10-CM

## 2015-12-18 DIAGNOSIS — R4182 Altered mental status, unspecified: Secondary | ICD-10-CM | POA: Diagnosis present

## 2015-12-18 DIAGNOSIS — R739 Hyperglycemia, unspecified: Secondary | ICD-10-CM | POA: Diagnosis present

## 2015-12-18 DIAGNOSIS — E785 Hyperlipidemia, unspecified: Secondary | ICD-10-CM | POA: Diagnosis present

## 2015-12-18 DIAGNOSIS — Z87891 Personal history of nicotine dependence: Secondary | ICD-10-CM

## 2015-12-18 DIAGNOSIS — Z8673 Personal history of transient ischemic attack (TIA), and cerebral infarction without residual deficits: Secondary | ICD-10-CM

## 2015-12-18 DIAGNOSIS — R1310 Dysphagia, unspecified: Secondary | ICD-10-CM | POA: Diagnosis present

## 2015-12-18 DIAGNOSIS — E871 Hypo-osmolality and hyponatremia: Secondary | ICD-10-CM | POA: Diagnosis not present

## 2015-12-18 DIAGNOSIS — I4891 Unspecified atrial fibrillation: Secondary | ICD-10-CM | POA: Diagnosis present

## 2015-12-18 DIAGNOSIS — J439 Emphysema, unspecified: Secondary | ICD-10-CM | POA: Diagnosis present

## 2015-12-18 DIAGNOSIS — Z79899 Other long term (current) drug therapy: Secondary | ICD-10-CM | POA: Diagnosis not present

## 2015-12-18 DIAGNOSIS — F419 Anxiety disorder, unspecified: Secondary | ICD-10-CM | POA: Diagnosis present

## 2015-12-18 DIAGNOSIS — I252 Old myocardial infarction: Secondary | ICD-10-CM

## 2015-12-18 DIAGNOSIS — F329 Major depressive disorder, single episode, unspecified: Secondary | ICD-10-CM | POA: Diagnosis present

## 2015-12-18 DIAGNOSIS — G709 Myoneural disorder, unspecified: Secondary | ICD-10-CM | POA: Diagnosis present

## 2015-12-18 DIAGNOSIS — M549 Dorsalgia, unspecified: Secondary | ICD-10-CM | POA: Diagnosis present

## 2015-12-18 DIAGNOSIS — I2510 Atherosclerotic heart disease of native coronary artery without angina pectoris: Secondary | ICD-10-CM | POA: Diagnosis present

## 2015-12-18 DIAGNOSIS — I110 Hypertensive heart disease with heart failure: Secondary | ICD-10-CM | POA: Diagnosis present

## 2015-12-18 DIAGNOSIS — I5042 Chronic combined systolic (congestive) and diastolic (congestive) heart failure: Secondary | ICD-10-CM | POA: Diagnosis present

## 2015-12-18 DIAGNOSIS — J449 Chronic obstructive pulmonary disease, unspecified: Secondary | ICD-10-CM

## 2015-12-18 DIAGNOSIS — G43909 Migraine, unspecified, not intractable, without status migrainosus: Secondary | ICD-10-CM | POA: Diagnosis present

## 2015-12-18 DIAGNOSIS — G8929 Other chronic pain: Secondary | ICD-10-CM | POA: Diagnosis present

## 2015-12-18 DIAGNOSIS — Z7901 Long term (current) use of anticoagulants: Secondary | ICD-10-CM

## 2015-12-18 DIAGNOSIS — K5900 Constipation, unspecified: Secondary | ICD-10-CM | POA: Diagnosis present

## 2015-12-18 DIAGNOSIS — Z888 Allergy status to other drugs, medicaments and biological substances status: Secondary | ICD-10-CM

## 2015-12-18 DIAGNOSIS — Z85118 Personal history of other malignant neoplasm of bronchus and lung: Secondary | ICD-10-CM

## 2015-12-18 DIAGNOSIS — K219 Gastro-esophageal reflux disease without esophagitis: Secondary | ICD-10-CM | POA: Diagnosis present

## 2015-12-18 DIAGNOSIS — Z923 Personal history of irradiation: Secondary | ICD-10-CM

## 2015-12-18 DIAGNOSIS — Z955 Presence of coronary angioplasty implant and graft: Secondary | ICD-10-CM

## 2015-12-18 DIAGNOSIS — Z7951 Long term (current) use of inhaled steroids: Secondary | ICD-10-CM

## 2015-12-18 DIAGNOSIS — R0989 Other specified symptoms and signs involving the circulatory and respiratory systems: Secondary | ICD-10-CM | POA: Diagnosis not present

## 2015-12-18 DIAGNOSIS — T380X5A Adverse effect of glucocorticoids and synthetic analogues, initial encounter: Secondary | ICD-10-CM | POA: Diagnosis present

## 2015-12-18 DIAGNOSIS — Z79891 Long term (current) use of opiate analgesic: Secondary | ICD-10-CM

## 2015-12-18 DIAGNOSIS — Z7952 Long term (current) use of systemic steroids: Secondary | ICD-10-CM

## 2015-12-18 LAB — MAGNESIUM: Magnesium: 1.7 mg/dL (ref 1.7–2.4)

## 2015-12-18 LAB — CBC WITH DIFFERENTIAL/PLATELET
BASOS ABS: 0 10*3/uL (ref 0.0–0.1)
BASOS PCT: 0 %
Eosinophils Absolute: 0 10*3/uL (ref 0.0–0.7)
Eosinophils Relative: 0 %
HEMATOCRIT: 38.7 % — AB (ref 39.0–52.0)
Hemoglobin: 13.1 g/dL (ref 13.0–17.0)
LYMPHS PCT: 19 %
Lymphs Abs: 1.7 10*3/uL (ref 0.7–4.0)
MCH: 28.7 pg (ref 26.0–34.0)
MCHC: 33.9 g/dL (ref 30.0–36.0)
MCV: 84.9 fL (ref 78.0–100.0)
MONO ABS: 0.2 10*3/uL (ref 0.1–1.0)
Monocytes Relative: 2 %
NEUTROS ABS: 6.9 10*3/uL (ref 1.7–7.7)
NEUTROS PCT: 79 %
Platelets: 283 10*3/uL (ref 150–400)
RBC: 4.56 MIL/uL (ref 4.22–5.81)
RDW: 17.8 % — AB (ref 11.5–15.5)
WBC: 8.8 10*3/uL (ref 4.0–10.5)

## 2015-12-18 LAB — URINALYSIS, ROUTINE W REFLEX MICROSCOPIC
BILIRUBIN URINE: NEGATIVE
Glucose, UA: NEGATIVE mg/dL
HGB URINE DIPSTICK: NEGATIVE
Ketones, ur: NEGATIVE mg/dL
Leukocytes, UA: NEGATIVE
NITRITE: NEGATIVE
PROTEIN: NEGATIVE mg/dL
SPECIFIC GRAVITY, URINE: 1.022 (ref 1.005–1.030)
pH: 7 (ref 5.0–8.0)

## 2015-12-18 LAB — COMPREHENSIVE METABOLIC PANEL
ALBUMIN: 3.7 g/dL (ref 3.5–5.0)
ALK PHOS: 47 U/L (ref 38–126)
ALT: 25 U/L (ref 17–63)
AST: 23 U/L (ref 15–41)
Anion gap: 10 (ref 5–15)
BUN: 26 mg/dL — AB (ref 6–20)
CALCIUM: 9.2 mg/dL (ref 8.9–10.3)
CHLORIDE: 97 mmol/L — AB (ref 101–111)
CO2: 25 mmol/L (ref 22–32)
CREATININE: 0.97 mg/dL (ref 0.61–1.24)
GFR calc non Af Amer: >60 mL/min (ref >60)
GLUCOSE: 122 mg/dL — AB (ref 65–99)
Potassium: 4.8 mmol/L (ref 3.5–5.1)
SODIUM: 132 mmol/L — AB (ref 135–145)
Total Bilirubin: 0.6 mg/dL (ref 0.3–1.2)
Total Protein: 6.5 g/dL (ref 6.5–8.1)

## 2015-12-18 LAB — PROTIME-INR
INR: 1.12
Prothrombin Time: 14.4 seconds (ref 11.4–15.2)

## 2015-12-18 LAB — TROPONIN I: Troponin I: <0.03 ng/mL (ref <0.03)

## 2015-12-18 LAB — PHOSPHORUS: Phosphorus: 3.5 mg/dL (ref 2.5–4.6)

## 2015-12-18 LAB — PROCALCITONIN

## 2015-12-18 LAB — LACTIC ACID, PLASMA: Lactic Acid, Venous: 1 mmol/L (ref 0.5–1.9)

## 2015-12-18 LAB — MRSA PCR SCREENING: MRSA BY PCR: NEGATIVE

## 2015-12-18 LAB — BRAIN NATRIURETIC PEPTIDE: B NATRIURETIC PEPTIDE 5: 38.6 pg/mL (ref 0.0–100.0)

## 2015-12-18 MED ORDER — IPRATROPIUM-ALBUTEROL 0.5-2.5 (3) MG/3ML IN SOLN
3.0000 mL | Freq: Four times a day (QID) | RESPIRATORY_TRACT | Status: DC
  Administered 2015-12-18 – 2015-12-20 (×7): 3 mL via RESPIRATORY_TRACT
  Filled 2015-12-18 (×9): qty 3

## 2015-12-18 MED ORDER — METOPROLOL TARTRATE 25 MG PO TABS: 50.0000 mg | ORAL_TABLET | Freq: Two times a day (BID) | ORAL | Status: DC

## 2015-12-18 MED ORDER — DULOXETINE HCL 30 MG PO CPEP
120.0000 mg | ORAL_CAPSULE | Freq: Every day | ORAL | Status: DC
Start: 2015-12-19 — End: 2015-12-20
  Administered 2015-12-19 – 2015-12-20 (×2): 120 mg via ORAL
  Filled 2015-12-18 (×2): qty 4

## 2015-12-18 MED ORDER — VANCOMYCIN HCL 10 G IV SOLR
1500.0000 mg | Freq: Once | INTRAVENOUS | Status: AC
  Administered 2015-12-18: 1500 mg via INTRAVENOUS
  Filled 2015-12-18: qty 1500

## 2015-12-18 MED ORDER — DIAZEPAM 5 MG PO TABS
5.0000 mg | ORAL_TABLET | Freq: Three times a day (TID) | ORAL | Status: DC | PRN
  Administered 2015-12-18 – 2015-12-19 (×3): 5 mg via ORAL
  Filled 2015-12-18 (×3): qty 1

## 2015-12-18 MED ORDER — BUDESONIDE 0.5 MG/2ML IN SUSP
0.5000 mg | Freq: Two times a day (BID) | RESPIRATORY_TRACT | Status: DC
  Administered 2015-12-18 – 2015-12-20 (×4): 0.5 mg via RESPIRATORY_TRACT
  Filled 2015-12-18 (×4): qty 2

## 2015-12-18 MED ORDER — TIZANIDINE HCL 4 MG PO TABS: 4.0000 mg | ORAL_TABLET | Freq: Three times a day (TID) | ORAL | Status: DC | PRN

## 2015-12-18 MED ORDER — FLUTICASONE PROPIONATE 50 MCG/ACT NA SUSP
1.0000 | Freq: Every day | NASAL | Status: DC
  Administered 2015-12-19 – 2015-12-20 (×2): 1 via NASAL
  Filled 2015-12-18: qty 16

## 2015-12-18 MED ORDER — ASPIRIN 300 MG RE SUPP: 300.0000 mg | RECTAL | Status: AC

## 2015-12-18 MED ORDER — FAMOTIDINE IN NACL 20-0.9 MG/50ML-% IV SOLN
20.0000 mg | Freq: Two times a day (BID) | INTRAVENOUS | Status: DC
  Administered 2015-12-18 – 2015-12-20 (×5): 20 mg via INTRAVENOUS
  Filled 2015-12-18 (×5): qty 50

## 2015-12-18 MED ORDER — PRIMIDONE 250 MG PO TABS
250.0000 mg | ORAL_TABLET | Freq: Two times a day (BID) | ORAL | Status: DC
  Administered 2015-12-18 – 2015-12-20 (×4): 250 mg via ORAL
  Filled 2015-12-18 (×4): qty 1

## 2015-12-18 MED ORDER — GUAIFENESIN ER 600 MG PO TB12
1200.0000 mg | ORAL_TABLET | Freq: Two times a day (BID) | ORAL | Status: DC
  Administered 2015-12-18 – 2015-12-20 (×4): 1200 mg via ORAL
  Filled 2015-12-18 (×5): qty 2

## 2015-12-18 MED ORDER — BUDESONIDE 0.25 MG/2ML IN SUSP: 0.5000 mg | Freq: Two times a day (BID) | RESPIRATORY_TRACT | Status: DC

## 2015-12-18 MED ORDER — ONDANSETRON HCL 4 MG/2ML IJ SOLN
4.0000 mg | Freq: Four times a day (QID) | INTRAMUSCULAR | Status: DC | PRN
  Administered 2015-12-19: 4 mg via INTRAVENOUS
  Filled 2015-12-18: qty 2

## 2015-12-18 MED ORDER — DOCUSATE SODIUM 100 MG PO CAPS
200.0000 mg | ORAL_CAPSULE | Freq: Every day | ORAL | Status: DC
Start: 2015-12-18 — End: 2015-12-20
  Administered 2015-12-18 – 2015-12-19 (×2): 200 mg via ORAL
  Filled 2015-12-18 (×2): qty 2

## 2015-12-18 MED ORDER — METHYLPREDNISOLONE SODIUM SUCC 125 MG IJ SOLR
80.0000 mg | Freq: Three times a day (TID) | INTRAMUSCULAR | Status: DC
  Administered 2015-12-18 – 2015-12-19 (×3): 80 mg via INTRAVENOUS
  Filled 2015-12-18 (×4): qty 2

## 2015-12-18 MED ORDER — ASPIRIN 81 MG PO CHEW
324.0000 mg | CHEWABLE_TABLET | ORAL | Status: AC
  Administered 2015-12-18: 324 mg via ORAL
  Filled 2015-12-18: qty 4

## 2015-12-18 MED ORDER — MONTELUKAST SODIUM 10 MG PO TABS
10.0000 mg | ORAL_TABLET | Freq: Every day | ORAL | Status: DC
  Administered 2015-12-18 – 2015-12-19 (×2): 10 mg via ORAL
  Filled 2015-12-18 (×2): qty 1

## 2015-12-18 MED ORDER — BUDESONIDE 0.25 MG/2ML IN SUSP: 0.2500 mg | Freq: Two times a day (BID) | RESPIRATORY_TRACT | Status: DC

## 2015-12-18 MED ORDER — ACETAMINOPHEN 325 MG PO TABS: 650.0000 mg | ORAL_TABLET | ORAL | Status: DC | PRN

## 2015-12-18 MED ORDER — VANCOMYCIN HCL IN DEXTROSE 1-5 GM/200ML-% IV SOLN
1000.0000 mg | Freq: Two times a day (BID) | INTRAVENOUS | Status: DC
  Administered 2015-12-19 – 2015-12-20 (×3): 1000 mg via INTRAVENOUS
  Filled 2015-12-18 (×3): qty 200

## 2015-12-18 MED ORDER — DILTIAZEM HCL ER COATED BEADS 120 MG PO CP24
240.0000 mg | ORAL_CAPSULE | Freq: Every day | ORAL | Status: DC
  Administered 2015-12-19 – 2015-12-20 (×2): 240 mg via ORAL
  Filled 2015-12-18 (×2): qty 2

## 2015-12-18 MED ORDER — METOPROLOL TARTRATE 25 MG PO TABS
50.0000 mg | ORAL_TABLET | Freq: Two times a day (BID) | ORAL | Status: DC
  Administered 2015-12-18 – 2015-12-20 (×4): 50 mg via ORAL
  Filled 2015-12-18 (×4): qty 2

## 2015-12-18 MED ORDER — PIPERACILLIN-TAZOBACTAM 3.375 G IVPB
3.3750 g | Freq: Three times a day (TID) | INTRAVENOUS | Status: DC
  Administered 2015-12-18 – 2015-12-20 (×6): 3.375 g via INTRAVENOUS
  Filled 2015-12-18 (×6): qty 50

## 2015-12-18 MED ORDER — POLYETHYLENE GLYCOL 3350 17 G PO PACK
17.0000 g | PACK | Freq: Every day | ORAL | Status: DC | PRN
  Administered 2015-12-18 – 2015-12-19 (×2): 17 g via ORAL
  Filled 2015-12-18 (×2): qty 1

## 2015-12-18 MED ORDER — DABIGATRAN ETEXILATE MESYLATE 150 MG PO CAPS
150.0000 mg | ORAL_CAPSULE | Freq: Two times a day (BID) | ORAL | Status: DC
  Administered 2015-12-18 – 2015-12-20 (×4): 150 mg via ORAL
  Filled 2015-12-18 (×4): qty 1

## 2015-12-18 MED ORDER — HYDROCODONE-ACETAMINOPHEN 10-325 MG PO TABS
1.0000 | ORAL_TABLET | Freq: Four times a day (QID) | ORAL | Status: DC | PRN
  Administered 2015-12-18 – 2015-12-20 (×6): 1 via ORAL
  Filled 2015-12-18 (×6): qty 1

## 2015-12-18 MED ORDER — SODIUM CHLORIDE 0.9 % IV SOLN: 250.0000 mL | INTRAVENOUS | Status: DC | PRN

## 2015-12-18 MED ORDER — DIVALPROEX SODIUM ER 250 MG PO TB24
1000.0000 mg | ORAL_TABLET | Freq: Every day | ORAL | Status: DC
  Administered 2015-12-18 – 2015-12-19 (×2): 1000 mg via ORAL
  Filled 2015-12-18 (×2): qty 4

## 2015-12-18 NOTE — H&P (Signed)
PULMONARY / CRITICAL CARE MEDICINE   Name: Malik Zuniga MRN: [id number] DOB: Jan 21, 1946    ADMISSION DATE:  (Not on file) CONSULTATION DATE:  12/18/15 REFERRING MD:  Clinic  CHIEF COMPLAINT:  Bronchiectasis exacerbation  HISTORY OF PRESENT ILLNESS:   70 year old with past medical history of prostate cancer, COPD, bronchiectasis with recurrent exacerbation, stage IA lung cancer [diagnosed in 2016, treated with radiation therapy]. He was admitted in June 2017 for respiratory failure, HCAP. At that time was also found to be in A. fib with RVR. He was intubated briefly at that time.  He has worsening cough with dyspnea, wheezing over the past week. His wife reports that he hasn't been able to use his percussion vest for the past 1 month due to equipment issues and is not able to bring up any sputum. He was given a course of Levaquin and a prednisone taper from the office with no improvement in symptoms. He finished the last dose dose of levaquin yesterday. He is here in the office for an acute visit. He does not have any subjective fevers but feels hot at home. He has alteration in mental status and will need to admission to the stepdown unit for IV antibiotics, IV steroids and evaluation for noninvasive ventilation. He is at risk for needing intubation.  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  PAST SURGICAL HISTORY: He  has a past surgical history that includes Cholecystectomy (1996); Craniotomy (1999); Coronary angioplasty with stent (2000); Hand surgery (1989); Ulnar nerve repair (1998); Gastrostomy w/ feeding tube (2008); Prostatectomy (04/2001); Coronary angioplasty with stent (04/1998); Carpal tunnel release (1998); Spine surgery; Brain surgery (1999); Back surgery (08/2004; 02/2005; 04/2006; 06/2007; 7/20101); Esophagogastroduodenoscopy (N/A, 07/23/2012); Colonoscopy; Back surgery (05/2013); and DG BIOPSY LUNG.  Allergies  Allergen Reactions  . Lisinopril Swelling    ANGIOEDEMA  . Lamictal [Lamotrigine] Other (See Comments)    Weakness/difficulty swallowing  . Ambien [Zolpidem] Other (See Comments)    Unknown reaction    No current facility-administered medications on file prior to encounter.    Current Outpatient Prescriptions on File Prior to Encounter  Medication Sig  . ARIPiprazole (ABILIFY) 10 MG tablet Take 5 mg by mouth daily.   . Ascorbic Acid (VITAMIN C) 1000 MG tablet Take 1,000 mg by mouth daily.  Marland Kitchen atorvastatin (LIPITOR) 20 MG tablet Take 1 tablet (20 mg total) by mouth daily at 6 PM.  . budesonide (PULMICORT) 0.25 MG/2ML nebulizer solution Take 4 mLs (0.5 mg total) by nebulization 2 (two) times daily.  . cholecalciferol (VITAMIN D) 1000 UNITS tablet Take 1,000 Units by mouth every morning.   . dabigatran (PRADAXA) 150 MG CAPS capsule Take 1 capsule (150 mg total) by mouth every 12 (twelve) hours.  . diazepam (VALIUM) 5 MG tablet Take 1 tablet (5 mg total) by mouth every 8 (eight) hours as needed for anxiety or muscle spasms. scheduled  . diltiazem (CARDIZEM CD) 240 MG 24 hr capsule Take 1  capsule (240 mg total) by mouth daily.  . divalproex (DEPAKOTE) 500 MG 24 hr tablet Take 1,000 mg by mouth at bedtime.   . docusate sodium (COLACE) 100 MG capsule Take 200 mg by mouth at bedtime.   .  DULoxetine (CYMBALTA) 60 MG capsule Take 120 mg by mouth daily.   . feeding supplement, ENSURE ENLIVE, (ENSURE ENLIVE) LIQD Take 237 mLs by mouth 2 (two) times daily between meals.  . fluticasone (FLONASE) 50 MCG/ACT nasal spray PLACE 2 SPRAYS INTO BOTH NOSTRILS DAILY.  Marland Kitchen guaiFENesin (MUCINEX) 600 MG 12 hr tablet Take 600 mg by mouth as needed. Reported on 08/13/2015  . guaiFENesin (ROBITUSSIN) 100 MG/5ML SOLN Take 5 mLs (100 mg total) by mouth every 4 (four) hours as needed for cough or to loosen phlegm.  Marland Kitchen HYDROcodone-acetaminophen (NORCO) 10-325 MG per tablet Take 1 tablet by mouth every 6 (six) hours as needed for moderate pain. for pain  . ipratropium-albuterol (DUONEB) 0.5-2.5 (3) MG/3ML SOLN Take 3 mLs by nebulization every 6 (six) hours.  . metoprolol (LOPRESSOR) 50 MG tablet Take 1 tablet (50 mg total) by mouth 2 (two) times daily.  . montelukast (SINGULAIR) 10 MG tablet TAKE 1 TABLET AT BEDTIME  . nitroGLYCERIN (NITROSTAT) 0.4 MG SL tablet PLACE 1 TABLET (0.4 MG TOTAL) UNDER THE TONGUE EVERY 5 (FIVE) MINUTES AS NEEDED FOR CHEST PAIN.  Marland Kitchen omeprazole (PRILOSEC) 40 MG capsule Take 40 mg by mouth 2 (two) times daily.   . polyethylene glycol (MIRALAX / GLYCOLAX) packet Take 17 g by mouth daily. For constipation  . predniSONE (DELTASONE) 10 MG tablet TAKE 1 TABLET EVERY DAY WITH BREAKFAST (Patient not taking: Reported on 12/18/2015)  . predniSONE (DELTASONE) 10 MG tablet Take 4 tabs for 3 days, 3 tabs for 3 days, 2 tabs for 2 days, 1 tab for 2 days  . primidone (MYSOLINE) 250 MG tablet TAKE 1 TABLET TWICE DAILY  . promethazine (PHENERGAN) 25 MG tablet Take 25 mg by mouth every 6 (six) hours as needed for nausea or vomiting. Reported on 04/06/2015  . ranitidine (ZANTAC) 150 MG tablet Take 150-300 mg by mouth 2 (two) times daily as needed for heartburn.  Marland Kitchen tiZANidine (ZANAFLEX) 4 MG tablet TAKE 1 TABLET BY MOUTH EVERY 8 HOURS AS NEEDED FOR MUSCLE SPASMS.  Marland Kitchen Zinc 50 MG TABS Take 50 mg by mouth daily.      FAMILY HISTORY:  His indicated that his mother is deceased. He indicated that his father is deceased. He indicated that his brother is alive. He indicated that his son is alive.    SOCIAL HISTORY: He  reports that he quit smoking about 3 years ago. His smoking use included Cigarettes. He has a 78.00 pack-year smoking history. He has never used smokeless tobacco. He reports that he uses drugs, including Mescaline. He reports that he does not drink alcohol.  REVIEW OF SYSTEMS:   As above  SUBJECTIVE:    VITAL SIGNS: There were no vitals taken for this visit.  HEMODYNAMICS:    VENTILATOR SETTINGS:    INTAKE / OUTPUT: No intake/output data recorded.  PHYSICAL EXAMINATION: Blood pressure 112/68, pulse 78, SpO2 (!) 88 %. General:  Somnolent, No distress Neuro:  No focal deficits HEENT: Moist mucus membranes, No thyromegaly JVD Cardiovascular:  Irregular, No MRG Lungs:  B/l crackles Abdomen:  Soft, + BS Musculoskeletal:  Normal tone and bulk Skin:  Intact  LABS:  BMET No results for input(s): NA, K, CL, CO2, BUN, CREATININE, GLUCOSE in the last  168 hours.  Electrolytes No results for input(s): CALCIUM, MG, PHOS in the last 168 hours.  CBC No results for input(s): WBC, HGB, HCT, PLT in the last 168 hours.  Coag's No results for input(s): APTT, INR in the last 168 hours.  Sepsis Markers No results for input(s): LATICACIDVEN, PROCALCITON, O2SATVEN in the last 168 hours.  ABG No results for input(s): PHART, PCO2ART, PO2ART in the last 168 hours.  Liver Enzymes No results for input(s): AST, ALT, ALKPHOS, BILITOT, ALBUMIN in the last 168 hours.  Cardiac Enzymes No results for input(s): TROPONINI, PROBNP in the last 168 hours.  Glucose No results for input(s): GLUCAP in the last 168 hours.  Imaging No results found.   STUDIES:  CXR 11/7 >  CULTURES: BCx 11/7 > Sp cx 11/7 >  ANTIBIOTICS: Vanco 11/7 > Zosyn 11/7 >  SIGNIFICANT  EVENTS:   LINES/TUBES:   DISCUSSION: 70 year old with bronchiectasis, recurrent exacerbation. Admitted with an acute exacerbation after he failed outpatient Levaquin and prednisone.   ASSESSMENT / PLAN:  PULMONARY A: Bronchiectasis with exacerbation Multiple aspiration PNAs COPD on home o2 at night On chronic prednisone 5 mg at home H/O IA Lung cancer  P:   Admit to SDU Check CBC, ABG Start vanco, zosyn for empiric treatment of PNA IV soluemdrol Continue pulmicort and duonebs Follow cultures, LA, Pct Chest PT Monitor for intubation needs  CARDIOVASCULAR A:  Afib Combined systolic diastolic heart failure.  H/O CAD. No evidence of ischemia on recent stress test.  HTN P:  Change pradaxa to IV heparin while he is NPO Hold outpt HTN meds. Hold cardizem and lopressor due to concern for sepsis.  Follow troponins, EKG  RENAL A:   Stable P:   Monitor urine output and Cr  GASTROINTESTINAL A:   Stable P:   Keep NPO for now  HEMATOLOGIC A:   Stable P:  Check CBC  INFECTIOUS A:   Bronchiectasis exacerbation Recurrent aspiration PNA P:   Abx as above Follow cultures, Pct, LA  ENDOCRINE A:   Stable P:   Monitor BS while on streoids  NEUROLOGIC A:   Prior h/o stoke Altered mental status P:   Check ABG to evaluate hypercarbia Consider CT head if no improvement.    FAMILY  - Updates: Wife and pt updated in office - Inter-disciplinary family meet or Palliative Care meeting due by:  11/14   Critical care time- 35 mins. Marshell Garfinkel MD [facility] Pager [phone] If no answer or after 3pm call: [phone] 12/18/2015, 10:28 AM

## 2015-12-18 NOTE — Progress Notes (Signed)
Pharmacy Antibiotic Follow-up Note  Malik Zuniga is a 70 y.o. year-old male admitted on 12/18/2015.  The patient is currently on day 1 of Vancomycin & Zosyn for PNA, rule out sepsis.  Assessment/Plan: Vancomycin '1500mg'$  x1, then '1000mg'$  IV every 12 hours.  Goal trough 15-20 mcg/mL. Zosyn 3.375g IV q8h (4 hour infusion).  No data recorded.  No results for input(s): WBC in the last 168 hours.  Invalid input(s):  CREATININE No results for input(s): CREATININE in the last 168 hours. CrCl cannot be calculated (Patient's most recent lab result is older than the maximum 21 days allowed.).    Allergies  Allergen Reactions  . Lisinopril Swelling    ANGIOEDEMA  . Lamictal [Lamotrigine] Other (See Comments)    Weakness/difficulty swallowing  . Ambien [Zolpidem] Other (See Comments)    Unknown reaction   Antimicrobials this admission: 11/7 Vancomycin >>  11/7 Zosyn >>   Levels/dose changes this admission:  Microbiology results: 11/7 BCx: sent 11/7 UCx: sent  11/7 MRSA PCR: sent  Thank you for allowing pharmacy to be a part of this patient's care.  Minda Ditto PharmD 12/18/2015 11:10 AM

## 2015-12-18 NOTE — Progress Notes (Signed)
Patient assessed at bedside.  Well known to PCCM NP.  Malik Zuniga appears in no acute distress.  He is awake/alert, appropriate.  Speech clear, no increased WOB.  Lungs bilaterally diminished posterior lower with few faint crackles.  VSS.  Wife Malik Zuniga) at bedside.  Compared to prior hospitalizations, he actually looks good.  Await labs, CXR evaluation.  NPO x meds.  Continue abx, steroids.  Will hold ABG for now.  Hold antihypertensive regimen until AM, may need to d/c.  Family updated on plan of care.     Malik Gens, NP-C [facility] Pgr: [phone] or if no answer [phone] 12/18/2015, 11:18 AM

## 2015-12-18 NOTE — Progress Notes (Signed)
[place] for Pradaxa, continue home dose Indication: atrial fibrillation  Allergies  Allergen Reactions  . Lisinopril Swelling    ANGIOEDEMA  . Lamictal [Lamotrigine] Other (See Comments)    Weakness/difficulty swallowing  . Ambien [Zolpidem] Other (See Comments)    Unknown reaction   Patient Measurements:   Heparin Dosing Weight: 71.7kg  Vital Signs: BP: 112/68 (11/07 0920) Pulse Rate: 78 (11/07 0920)  Labs: No results for input(s): HGB, HCT, PLT, APTT, LABPROT, INR, HEPARINUNFRC, HEPRLOWMOCWT, CREATININE, CKTOTAL, CKMB, TROPONINI in the last 72 hours.  CrCl cannot be calculated (Patient's most recent lab result is older than the maximum 21 days allowed.).  Medical History: Past Medical History:  Diagnosis Date  . Anemia associated with acute blood loss   . Angioedema 10/31/2010  . Anxiety    takes Valium daily  . Aspiration pneumonia ([place]) 2010  . Asthma   . Bacteremia due to Pseudomonas 10/04/2011   D/t gram neg rods   . 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 Chronic back pain    compression fracture  . Constipation    takes Colace daily as well as Miralax  . Coronary artery disease   . Depression    takes Cymbalta daily  . Emphysema of lung (HCC)    Albuterol as needed;Symbicort daily and Singulair at bedtime  . GERD (gastroesophageal reflux disease)    takes Omeprazole daily  . Headache, chronic daily   . History of bronchitis   . History of colon polyps   . History of migraine    last migraine a couple of days ago;takes Excedrin Migraine  . History of prostate cancer 2004  . Hyperlipidemia    takes Simvastatin daily  . Hypertension    takes Metoprolol daily as well as Hyzaar  . Insomnia    takes Benadryl nightly  . Lung cancer ([place]) 2016  . Mood change ([place])    after Brain surgery mood changed and was placed on Depakote  . Myocardial infarction 04/1998  . Neuromuscular  disorder ([place]) 1998   right carpal tunnel release  . On home O2   . Shortness of breath    with exertion  . Stroke [facility]) 1998   Brain Aneurysm  . Tremor    Medications:  Scheduled:  . budesonide (PULMICORT) nebulizer solution  0.5 mg Nebulization BID  . dabigatran  150 mg Oral Q12H  . [START ON 12/19/2015] diltiazem  240 mg Oral Daily  . divalproex  1,000 mg Oral QHS  . docusate sodium  200 mg Oral QHS  . [START ON 12/19/2015] DULoxetine  120 mg Oral Daily  . famotidine (PEPCID) IV  20 mg Intravenous Q12H  . [START ON 12/19/2015] fluticasone  1 spray Each Nare Daily  . guaiFENesin  1,200 mg Oral BID  . ipratropium-albuterol  3 mL Nebulization Q6H  . methylPREDNISolone (SOLU-MEDROL) injection  80 mg Intravenous Q8H  . [START ON 12/19/2015] metoprolol  50 mg Oral BID  . montelukast  10 mg Oral QHS  . piperacillin-tazobactam (ZOSYN)  IV  3.375 g Intravenous Q8H  . primidone  250 mg Oral BID  . vancomycin  1,500 mg Intravenous Once  . vancomycin  1,000 mg Intravenous Q12H   Assessment: 3 yoM direct admit to ICU-SD 11/7 from [employer] office for COPD exacerbation/bronchiectasis; failed outpt Levaquin and Prednisone. Dabigatran '150mg'$  po bid for Afib, last dose 11/7 at 0600 At  risk for intubation, if patient intubated/ NPO, plan IV Heparin.  Goal of Therapy:  Monitor platelets by anticoagulation protocol: Yes   Plan:   Continue Dabigatran '150mg'$  po bid  Monitor renal function, CBC  Shadai Mcclane L 12/18/2015,11:19 AM

## 2015-12-18 NOTE — Progress Notes (Signed)
CPT held at this time. Patient eating lunch. RT will begin therapy at scheduled 4p tx. RT will continue to monitor patient.

## 2015-12-18 NOTE — Progress Notes (Addendum)
Pt seen in office today and admitted to [facility] SDU for bronchiectasis exacerbation. See H& P from today for complete details.

## 2015-12-19 LAB — BASIC METABOLIC PANEL
Anion gap: 10 (ref 5–15)
BUN: 19 mg/dL (ref 6–20)
CO2: 25 mmol/L (ref 22–32)
CREATININE: 0.69 mg/dL (ref 0.61–1.24)
Calcium: 8.7 mg/dL — ABNORMAL LOW (ref 8.9–10.3)
Chloride: 97 mmol/L — ABNORMAL LOW (ref 101–111)
GFR calc Af Amer: >60 mL/min (ref >60)
Glucose, Bld: 146 mg/dL — ABNORMAL HIGH (ref 65–99)
POTASSIUM: 4.2 mmol/L (ref 3.5–5.1)
SODIUM: 132 mmol/L — AB (ref 135–145)

## 2015-12-19 LAB — CBC
HCT: 37.5 % — ABNORMAL LOW (ref 39.0–52.0)
Hemoglobin: 12.5 g/dL — ABNORMAL LOW (ref 13.0–17.0)
MCH: 28.3 pg (ref 26.0–34.0)
MCHC: 33.3 g/dL (ref 30.0–36.0)
MCV: 84.8 fL (ref 78.0–100.0)
PLATELETS: 276 10*3/uL (ref 150–400)
RBC: 4.42 MIL/uL (ref 4.22–5.81)
RDW: 17.2 % — ABNORMAL HIGH (ref 11.5–15.5)
WBC: 10.2 10*3/uL (ref 4.0–10.5)

## 2015-12-19 LAB — STREP PNEUMONIAE URINARY ANTIGEN: STREP PNEUMO URINARY ANTIGEN: NEGATIVE

## 2015-12-19 MED ORDER — ATORVASTATIN CALCIUM 10 MG PO TABS
20.0000 mg | ORAL_TABLET | Freq: Every day | ORAL | Status: DC
  Administered 2015-12-19: 20 mg via ORAL
  Filled 2015-12-19: qty 2

## 2015-12-19 MED ORDER — METHYLPREDNISOLONE SODIUM SUCC 125 MG IJ SOLR: 60.0000 mg | Freq: Two times a day (BID) | INTRAMUSCULAR | Status: DC

## 2015-12-19 MED ORDER — ENSURE ENLIVE PO LIQD
237.0000 mL | Freq: Two times a day (BID) | ORAL | Status: DC
  Administered 2015-12-19 – 2015-12-20 (×2): 237 mL via ORAL

## 2015-12-19 MED ORDER — METHYLPREDNISOLONE SODIUM SUCC 125 MG IJ SOLR
60.0000 mg | Freq: Every day | INTRAMUSCULAR | Status: DC
  Filled 2015-12-19: qty 2

## 2015-12-19 MED ORDER — VITAMIN D3 25 MCG (1000 UNIT) PO TABS
1000.0000 [IU] | ORAL_TABLET | Freq: Every morning | ORAL | Status: DC
  Administered 2015-12-19 – 2015-12-20 (×2): 1000 [IU] via ORAL
  Filled 2015-12-19 (×2): qty 1

## 2015-12-19 MED ORDER — ARIPIPRAZOLE 5 MG PO TABS
5.0000 mg | ORAL_TABLET | Freq: Every day | ORAL | Status: DC
  Administered 2015-12-19 – 2015-12-20 (×2): 5 mg via ORAL
  Filled 2015-12-19 (×2): qty 1

## 2015-12-19 NOTE — Progress Notes (Signed)
Pt has refused Chest Vest at this time, Pt states that he has already done it twice today and that's enough.  RT to monitor and assess as needed.

## 2015-12-19 NOTE — Progress Notes (Signed)
CPT held at this time due to patient request to eat breakfast. Will perform therapy at next scheduled time.

## 2015-12-19 NOTE — Progress Notes (Signed)
PULMONARY / CRITICAL CARE MEDICINE   Name: Malik Zuniga MRN: [id number] DOB: 1945-10-21    ADMISSION DATE:  12/18/2015 CONSULTATION DATE:  12/18/15 REFERRING MD:  Clinic  CHIEF COMPLAINT:  Bronchiectasis exacerbation  SUMMARY:   70 year old with past medical history of prostate cancer, COPD, bronchiectasis with recurrent exacerbation, stage IA lung cancer [diagnosed in 2016, treated with radiation therapy]. He was admitted in June 2017 for respiratory failure, HCAP. At that time was also found to be in A. fib with RVR. He was intubated briefly at that time.  In September 2017, he was started on prednisone '5mg'$  QD.    The patient was seen in the Pulmonary office on 11/7 for complaints of one week of worsening cough with dyspnea, wheezing and falls. His wife reports that he hasn't been able to use his percussion vest for the past 1 month due to equipment issues (puppy ate the electrical cord and it took a month to get a new one from the company) and is not able to bring up any sputum. He was given a course of Levaquin and a prednisone taper from the office with no improvement in symptoms. He finished the last dose dose of levaquin 11/6. Unfortunately, symptoms did not improve.  He denied fevers/chills but reported feeling "hot all the time".  In the office, there were concerns for altered mental status / possible impending respiratory failure.  He was directly admitted to [facility] for further evaluation.  The patient was treated with IV antibiotics (vanco / zosyn), IV steroids, aggressive pulmonary hygiene (IS, chest vest) and bronchodilators.     SUBJECTIVE:  Pt reports feeling "better but not great" this am.  Afebrile, VSS on home regimen.     VITAL SIGNS: BP (!) 120/58   Pulse 81   Temp (!) 96.9 F (36.1 C) (Axillary)   Resp 13   Ht '5\' 9"'$  (1.753 m)   Wt 164 lb 10.9 oz (74.7 kg)   SpO2 95%   BMI 24.32 kg/m   HEMODYNAMICS:    VENTILATOR SETTINGS:    INTAKE / OUTPUT: I/O last 3  completed shifts: In: 990 [I.V.:40; IV Piggyback:950] Out: 1300 [Urine:1300]  PHYSICAL EXAMINATION: General:  Chronically ill appearing male in NAD Neuro:  Awake, alert, no focal deficits HEENT: Moist mucus membranes, No thyromegaly JVD Cardiovascular:  Irregular, No MRG Lungs:  Even/non-labored, lungs bilaterally coarse with wheeze Abdomen:  Soft, + BS Musculoskeletal:  Normal tone and bulk Skin:  Intact  LABS:  BMET  Recent Labs Lab 12/18/15 1149 12/19/15 0319  NA 132* 132*  K 4.8 4.2  CL 97* 97*  CO2 25 25  BUN 26* 19  CREATININE 0.97 0.69  GLUCOSE 122* 146*    Electrolytes  Recent Labs Lab 12/18/15 1149 12/19/15 0319  CALCIUM 9.2 8.7*  MG 1.7  --   PHOS 3.5  --     CBC  Recent Labs Lab 12/18/15 1149 12/19/15 0319  WBC 8.8 10.2  HGB 13.1 12.5*  HCT 38.7* 37.5*  PLT 283 276    Coag's  Recent Labs Lab 12/18/15 1149  INR 1.12    Sepsis Markers  Recent Labs Lab 12/18/15 1149  LATICACIDVEN 1.0  PROCALCITON <0.10    ABG No results for input(s): PHART, PCO2ART, PO2ART in the last 168 hours.  Liver Enzymes  Recent Labs Lab 12/18/15 1149  AST 23  ALT 25  ALKPHOS 47  BILITOT 0.6  ALBUMIN 3.7    Cardiac Enzymes  Recent Labs Lab 12/18/15 1149  TROPONINI <0.03    Glucose No results for input(s): GLUCAP in the last 168 hours.  Imaging Dg Chest Port 1 View  Result Date: 12/18/2015 CLINICAL DATA:  Decreased breath sounds. History of asthma, previous CVA, lung malignancy. EXAM: PORTABLE CHEST 1 VIEW COMPARISON:  Portable chest x-ray of August 05, 2015 FINDINGS: The lungs are well-expanded. There is no alveolar infiltrate. The patient's chin obscures portions of the left pulmonary apex. There is minimal linear density peripherally in the left mid lung. There are coarse infrahilar lung markings on the right which are not entirely new. The heart and pulmonary vascularity are normal. There is calcification in the wall of the thoracic  aorta. There is dextrocurvature centered in the mid to lower thoracic spine. The patient has undergone previous lower thoracic and lumbar fusion. IMPRESSION: There is no evidence of pneumonia or CHF. Minimal atelectasis or scarring in the left mid lung and right infrahilar region is present. Aortic atherosclerosis. Electronically Signed   By: Norton  Martinique M.D.   On: 12/18/2015 11:57     STUDIES:  CXR 11/7 >> no acute process (images personally reviewed), minimal atelectasis vs scarring on L   CULTURES: BCx 11/7 >> Sp cx 11/7 >>  ANTIBIOTICS: Vanco 11/7 >> Zosyn 11/7 >>  SIGNIFICANT EVENTS: 11/07  Admit with AMS, concern for respiratory failure  LINES/TUBES:   DISCUSSION: 70 year old with bronchiectasis, recurrent exacerbation. Admitted with an acute exacerbation after he failed outpatient Levaquin and prednisone.   ASSESSMENT / PLAN:  PULMONARY A: Bronchiectasis with acute exacerbation Multiple aspiration PNAs COPD on home o2 at night On chronic prednisone 5 mg at home H/O IA Lung cancer  P:   Pulmonary hygiene - IS, mobilize, percussion  See ID Continue solumedrol, reduce to 60 mg IV Q12 Continue pulmicort and duonebs Follow cultures, LA, Pct Chest PT Monitor for intubation need  CARDIOVASCULAR A:  Afib Combined systolic diastolic heart failure H/O CAD - no evidence of ischemia on recent stress test HTN HLD P:  Continue pradaxa Continue lopressor, cardizem   Continue tele monitoring   RENAL A:   Mild Hyponatremia  P:   Monitor urine output and Cr BMP in am  Replace electrolytes as indicated  GASTROINTESTINAL A:   Hx Recurrent Aspiration  Dysphagia - on pureed diet P:   Pureed diet  Aspiration precautions   HEMATOLOGIC A:   Chronic Anticoagulation P:  Check CBC Pradaxa for DVT prophylaxis   INFECTIOUS A:   Bronchiectasis exacerbation Recurrent aspiration PNA P:   Abx as above, narrow in am 11/9 Follow cultures, PCT,  LA  ENDOCRINE A:   Steroid Induced Hyperglycemia  P:   Monitor BS while on streoids  NEUROLOGIC A:   Prior h/o stoke Altered mental status - resolved on admit to SDU P:   Monitor neuro status    FAMILY  - Updates: Wife and pt updated at bedside.  - Inter-disciplinary family meet or Palliative Care meeting due by:  11/14   Disposition:  Transfer to med surg with tele (AF), likely discharge 11/10   [name], NP-C [place] Pgr: [phone] or if no answer [phone] 12/19/2015, 9:35 AM

## 2015-12-19 NOTE — Care Management Note (Signed)
Case Management Note  Patient Details  Name: Malik Zuniga MRN: [id number] Date of Birth: 13-Apr-1945  Subjective/Objective:     Copd                Action/Plan: home  Date:  December 19, 2015 Chart reviewed for concurrent status and case management needs. Will continue to follow the patient for status change: Discharge Planning: following for needs Expected discharge date: [id number] Velva Harman, BSN, [place] [place]  Expected Discharge Date:   (unknown)               Expected Discharge Plan:     In-House Referral:     Discharge planning Services     Post Acute Care Choice:    Choice offered to:     DME Arranged:    DME Agency:     HH Arranged:    [place] Agency:     Status of Service:     If discussed at [employer] of Stay Meetings, dates discussed:    Additional Comments:  Leeroy Cha, RN 12/19/2015, 9:41 AM

## 2015-12-20 ENCOUNTER — Inpatient Hospital Stay (HOSPITAL_COMMUNITY): Payer: Medicare Other

## 2015-12-20 ENCOUNTER — Telehealth: Payer: Self-pay

## 2015-12-20 ENCOUNTER — Other Ambulatory Visit: Payer: Self-pay | Admitting: *Deleted

## 2015-12-20 ENCOUNTER — Encounter: Payer: Self-pay | Admitting: *Deleted

## 2015-12-20 DIAGNOSIS — J441 Chronic obstructive pulmonary disease with (acute) exacerbation: Secondary | ICD-10-CM

## 2015-12-20 LAB — CBC
HEMATOCRIT: 40.5 % (ref 39.0–52.0)
HEMOGLOBIN: 13.5 g/dL (ref 13.0–17.0)
MCH: 28.4 pg (ref 26.0–34.0)
MCHC: 33.3 g/dL (ref 30.0–36.0)
MCV: 85.3 fL (ref 78.0–100.0)
Platelets: 286 10*3/uL (ref 150–400)
RBC: 4.75 MIL/uL (ref 4.22–5.81)
RDW: 17.5 % — ABNORMAL HIGH (ref 11.5–15.5)
WBC: 11.9 10*3/uL — AB (ref 4.0–10.5)

## 2015-12-20 LAB — URINE CULTURE: Culture: NO GROWTH

## 2015-12-20 LAB — BASIC METABOLIC PANEL
ANION GAP: 7 (ref 5–15)
BUN: 23 mg/dL — ABNORMAL HIGH (ref 6–20)
CALCIUM: 8.8 mg/dL — AB (ref 8.9–10.3)
CO2: 31 mmol/L (ref 22–32)
Chloride: 95 mmol/L — ABNORMAL LOW (ref 101–111)
Creatinine, Ser: 0.78 mg/dL (ref 0.61–1.24)
GLUCOSE: 94 mg/dL (ref 65–99)
POTASSIUM: 4.2 mmol/L (ref 3.5–5.1)
Sodium: 133 mmol/L — ABNORMAL LOW (ref 135–145)

## 2015-12-20 MED ORDER — PREDNISONE 10 MG PO TABS
ORAL_TABLET | ORAL | 0 refills | Status: DC
Start: 2015-12-20 — End: 2016-01-09

## 2015-12-20 MED ORDER — METHYLPREDNISOLONE SODIUM SUCC 40 MG IJ SOLR
40.0000 mg | Freq: Every day | INTRAMUSCULAR | Status: DC
  Administered 2015-12-20: 40 mg via INTRAVENOUS

## 2015-12-20 NOTE — Discharge Summary (Signed)
Physician Discharge Summary  Patient ID: Malik Zuniga MRN: [id number] DOB/AGE: [birth year] 70 y.o.  Admit date: 12/18/2015 Discharge date: 12/20/2015    Discharge Diagnoses:  Bronchiectasis with acute exacerbation Hx Recurrent Aspiration PNA COPD  Nocturnal O2 Dependent  Chronic prednisone - 5 mg at baseline H/O IA Lung cancer  Afib Chronic Anticoagulation Combined systolic diastolic heart failure H/O CAD - no evidence of ischemia on recent stress test HTN HLD                                                               Mild Hyponatremia Hx Recurrent Aspiration  Dysphagia - on pureed diet Steroid Induced Hyperglycemia  Hx stroke Altered mental status          DISCHARGE PLAN BY DIAGNOSIS     Bronchiectasis with acute exacerbation Hx Recurrent Aspiration PNA COPD - nocturnal O2 dependent  Chronic prednisone - 5 mg at baseline H/O IA Lung cancer  Bronchiectasis exacerbation - wonder if this was actual failure of outpatient therapy vs slow to resolve, 5 days outpatient levaquin.  CXR with minimal atelectasis.  No significant sputum production.    Discharge Plan: Continue home pulmonary hygiene  D7/7 abx.  Will hold further for now.  Taper prednisone back to baseline, 58m QD Continue home regimen > Pulmicort BID, flonase, mucinex, duoneb, singulair Follow up with Dr. MLake Bellsas planned  Continue O2 as previously ordered   Afib Chronic Anticoagulation Combined systolic diastolic heart failure H/O CAD - no evidence of ischemia on recent stress test HTN HLD        Discharge Plan: Continue outpatient lipitor, Pradaxa, cardizem, lopressor                                                          Mild Hyponatremia  Discharge Plan: Follow up BMP PRN  Hx Recurrent Aspiration  Dysphagia - on pureed diet  Discharge Plan: Aspiration precautions  Pureed diet   Steroid Induced Hyperglycemia   Discharge Plan: Anticipate will resolve with reduction of steroids    Hx CVA  Altered mental status - resolved on admit to SDU  Discharge Plan: Continue outpatient Abilify, valium, depakote, cymbalta, norco Minimize sedating medications (this has been an issue in the past with his swallowing)                 DISCHARGE SUMMARY   Malik VISEis a 70y.o. y/o male with a PMH of prostate cancer, COPD, bronchiectasis with recurrent exacerbation, stage IA lung cancer [diagnosed in 2016, treated with radiation therapy]. He was admitted in June 2017 for respiratory failure, HCAP. At that time was also found to be in A. fib with RVR. He was intubated briefly at that time.  In September 2017, he was started on prednisone 529mQD.    The patient was seen in the Pulmonary office on 11/7 for complaints of one week of worsening cough with dyspnea, wheezing and falls. His wife reports that he hasn't been able to use his percussion vest for the past 1 month due to equipment issues (puppy ate the electrical  cord and it took a month to get a new one from the company) and is not able to bring up any sputum. He was given a course of Levaquin and a prednisone taper from the office with no improvement in symptoms. He finished the last dose dose of levaquin 11/6. Unfortunately, symptoms did not improve.  He denied fevers/chills but reported feeling "hot all the time".  In the office, there were concerns for altered mental status / possible impending respiratory failure.  He was directly admitted to [facility] for further evaluation.  The patient was treated with IV antibiotics (vanco / zosyn), IV steroids, aggressive pulmonary hygiene (IS, chest vest) and bronchodilators.  CXR remained negative.  Patient afebrile during admission. The patient improved and was medically cleared for discharge 11/9 with plans as above.    STUDIES:  CXR 11/7 >> no acute process (images personally reviewed), minimal atelectasis vs scarring on L   CULTURES: BCx 11/7 >> 11/9 Sp cx 11/7 >> not able to produce  sputum  UC 11/7 >> negative  ANTIBIOTICS: Vanco 11/7 >> 11/9 Zosyn 11/7 >> 11/9  SIGNIFICANT EVENTS: 11/07  Admit with AMS, concern for respiratory failure 11/09  D/c    Discharge Exam: General:  Chronically ill appearing male in NAD Neuro:  Awake, alert, no focal deficits HEENT: Moist mucus membranes, No thyromegaly JVD Cardiovascular:  Irregular, No MRG Lungs:  Even/non-labored, lungs with good air movement, clear Abdomen:  Soft, + BS Musculoskeletal:  Normal tone and bulk Skin:  Intact  Vitals:   12/20/15 0620 12/20/15 0800 12/20/15 0809 12/20/15 1000  BP:  121/65  140/62  Pulse:  73  91  Resp:  16  (!) 21  Temp: 97.2 F (36.2 C)     TempSrc: Axillary     SpO2:  90% 94% 100%  Weight:      Height:         Discharge Labs  BMET  Recent Labs Lab 12/18/15 1149 12/19/15 0319 12/20/15 0310  NA 132* 132* 133*  K 4.8 4.2 4.2  CL 97* 97* 95*  CO2 _0 GLUCOSE 122* 146* 94  BUN 26* 19 23*  CREATININE 0.97 0.69 0.78  CALCIUM 9.2 8.7* 8.8*  MG 1.7  --   --   PHOS 3.5  --   --     CBC  Recent Labs Lab 12/18/15 1149 12/19/15 0319 12/20/15 0310  HGB 13.1 12.5* 13.5  HCT 38.7* 37.5* 40.5  WBC 8.8 10.2 11.9*  PLT 283 276 286    Anti-Coagulation  Recent Labs Lab 12/18/15 1149  INR 1.12    Discharge Instructions    Call MD for:  difficulty breathing, headache or visual disturbances    Complete by:  As directed    Call MD for:  extreme fatigue    Complete by:  As directed    Call MD for:  hives    Complete by:  As directed    Call MD for:  persistant dizziness or light-headedness    Complete by:  As directed    Call MD for:  persistant nausea and vomiting    Complete by:  As directed    Call MD for:  redness, tenderness, or signs of infection (pain, swelling, redness, odor or green/yellow discharge around incision site)    Complete by:  As directed    Call MD for:  severe uncontrolled pain    Complete by:  As directed    Call MD for:  temperature >100.4    Complete by:  As directed    Diet - low sodium heart healthy    Complete by:  As directed    Pureed diet with aspiration precautions   Discharge instructions    Complete by:  As directed    1.  Review your medications carefully 2.  Follow up with Dr. [name] as scheduled 3.  Continue your pureed diet with aspiration precautions 4.  Continue your chest vest, minimum of 2 times per day 5.  Call if new or worsening symptoms > fevers, chills, increased cough, increased sputum production, chest pain etc 6.  Return to ER if needed   Increase activity slowly    Complete by:  As directed      Follow-up Information    Simonne Maffucci, MD Follow up on 01/09/2016.   Specialty:  Pulmonary Disease Why:  Appt 9:30 AM  Contact information: [place] [address] [phone]              Medication List    TAKE these medications   ARIPiprazole 10 MG tablet Commonly known as:  ABILIFY Take 5 mg by mouth daily.   atorvastatin 20 MG tablet Commonly known as:  LIPITOR Take 1 tablet (20 mg total) by mouth daily at 6 PM.   budesonide 0.25 MG/2ML nebulizer solution Commonly known as:  PULMICORT Take 4 mLs (0.5 mg total) by nebulization 2 (two) times daily.   cholecalciferol 1000 units tablet Commonly known as:  VITAMIN D Take 1,000 Units by mouth every morning.   dabigatran 150 MG Caps capsule Commonly known as:  PRADAXA Take 1 capsule (150 mg total) by mouth every 12 (twelve) hours.   diazepam 5 MG tablet Commonly known as:  VALIUM Take 1 tablet (5 mg total) by mouth every 8 (eight) hours as needed for anxiety or muscle spasms. scheduled   diltiazem 240 MG 24 hr capsule Commonly known as:  CARDIZEM CD Take 1 capsule (240 mg total) by mouth daily.   divalproex 500 MG 24 hr tablet Commonly known as:  DEPAKOTE ER Take 1,000 mg by mouth at bedtime.   docusate sodium 100 MG capsule Commonly known as:  COLACE Take 200 mg by mouth at bedtime.    DULoxetine 60 MG capsule Commonly known as:  CYMBALTA Take 120 mg by mouth daily.   feeding supplement (ENSURE ENLIVE) Liqd Take 237 mLs by mouth 2 (two) times daily between meals.   fluticasone 50 MCG/ACT nasal spray Commonly known as:  FLONASE PLACE 2 SPRAYS INTO BOTH NOSTRILS DAILY. What changed:  See the new instructions.   guaiFENesin 600 MG 12 hr tablet Commonly known as:  MUCINEX Take 1,200 mg by mouth 2 (two) times daily as needed for cough or to loosen phlegm. Reported on 08/13/2015   guaiFENesin 100 MG/5ML Soln Commonly known as:  ROBITUSSIN Take 5 mLs (100 mg total) by mouth every 4 (four) hours as needed for cough or to loosen phlegm.   HEADACHE RELIEF PO Take 1-2 tablets by mouth every 6 (six) hours as needed (headaches).   HYDROcodone-acetaminophen 10-325 MG tablet Commonly known as:  NORCO Take 1 tablet by mouth every 4 (four) hours as needed for moderate pain. for pain   ipratropium-albuterol 0.5-2.5 (3) MG/3ML Soln Commonly known as:  DUONEB Take 3 mLs by nebulization every 6 (six) hours.   metoprolol 50 MG tablet Commonly known as:  LOPRESSOR Take 1 tablet (50 mg total) by mouth 2 (two) times daily.  montelukast 10 MG tablet Commonly known as:  SINGULAIR TAKE 1 TABLET AT BEDTIME   nitroGLYCERIN 0.4 MG SL tablet Commonly known as:  NITROSTAT PLACE 1 TABLET (0.4 MG TOTAL) UNDER THE TONGUE EVERY 5 (FIVE) MINUTES AS NEEDED FOR CHEST PAIN.   omeprazole 40 MG capsule Commonly known as:  PRILOSEC Take 40 mg by mouth 2 (two) times daily.   polyethylene glycol packet Commonly known as:  MIRALAX / GLYCOLAX Take 17 g by mouth 2 (two) times daily as needed for moderate constipation. For constipation   predniSONE 10 MG tablet Commonly known as:  DELTASONE 4 tabs x 2 days, then 3 tabs x2 days, 2 tabs x2 days, 1 tab x2 days and then 5 mg daily What changed:  additional instructions   primidone 250 MG tablet Commonly known as:  MYSOLINE TAKE 1 TABLET TWICE  DAILY What changed:  See the new instructions.   promethazine 25 MG tablet Commonly known as:  PHENERGAN Take 25 mg by mouth every 6 (six) hours as needed for nausea or vomiting. Reported on 04/06/2015   ranitidine 150 MG tablet Commonly known as:  ZANTAC Take 150-300 mg by mouth 2 (two) times daily as needed for heartburn.   tiZANidine 4 MG tablet Commonly known as:  ZANAFLEX TAKE 1 TABLET BY MOUTH EVERY 8 HOURS AS NEEDED FOR MUSCLE SPASMS. What changed:  See the new instructions.   vitamin C 1000 MG tablet Take 1,000 mg by mouth daily.   Zinc 50 MG Tabs Take 50 mg by mouth daily.         Disposition: Home.  No new home health needs identified prior to discharge.   Discharged Condition: Malik Zuniga has met maximum benefit of inpatient care and is medically stable and cleared for discharge.  Patient is pending follow up as above.      Time spent on disposition:  Greater than 35 minutes.   Signed: [name], NP-C [facility] Pgr: [phone] Office: [phone]

## 2015-12-20 NOTE — Progress Notes (Signed)
PULMONARY / CRITICAL CARE MEDICINE   Name: Malik Zuniga MRN: [id number] DOB: 1946/02/05    ADMISSION DATE:  12/18/2015 CONSULTATION DATE:  12/18/15 REFERRING MD:  Clinic  CHIEF COMPLAINT:  Bronchiectasis exacerbation  SUMMARY:   69 year old with past medical history of prostate cancer, COPD, bronchiectasis with recurrent exacerbation, stage IA lung cancer [diagnosed in 2016, treated with radiation therapy]. He was admitted in June 2017 for respiratory failure, HCAP. At that time was also found to be in A. fib with RVR. He was intubated briefly at that time.  In September 2017, he was started on prednisone '5mg'$  QD.    The patient was seen in the Pulmonary office on 11/7 for complaints of one week of worsening cough with dyspnea, wheezing and falls. His wife reports that he hasn't been able to use his percussion vest for the past 1 month due to equipment issues (puppy ate the electrical cord and it took a month to get a new one from the company) and is not able to bring up any sputum. He was given a course of Levaquin and a prednisone taper from the office with no improvement in symptoms. He finished the last dose dose of levaquin 11/6. Unfortunately, symptoms did not improve.  He denied fevers/chills but reported feeling "hot all the time".  In the office, there were concerns for altered mental status / possible impending respiratory failure.  He was directly admitted to [facility] for further evaluation.  The patient was treated with IV antibiotics (vanco / zosyn), IV steroids, aggressive pulmonary hygiene (IS, chest vest) and bronchodilators.     SUBJECTIVE:   Afebrile.  Pt reports he is anxious to go home.  Denies sputum production / fevers / chills   VITAL SIGNS: BP 140/73   Pulse 75   Temp 97.2 F (36.2 C) (Axillary)   Resp 15   Ht '5\' 9"'$  (1.753 m)   Wt 169 lb 1.5 oz (76.7 kg)   SpO2 94%   BMI 24.97 kg/m   HEMODYNAMICS:    VENTILATOR SETTINGS:    INTAKE / OUTPUT: I/O last 3 completed  shifts: In: 950 [IV Piggyback:950] Out: 6144 [Urine:3375]  PHYSICAL EXAMINATION: General:  Chronically ill appearing male in NAD Neuro:  Awake, alert, no focal deficits HEENT: Moist mucus membranes, No thyromegaly JVD Cardiovascular:  Irregular, No MRG Lungs:  Even/non-labored, lungs with good air movement, clear Abdomen:  Soft, + BS Musculoskeletal:  Normal tone and bulk Skin:  Intact  LABS:  BMET  Recent Labs Lab 12/18/15 1149 12/19/15 0319 12/20/15 0310  NA 132* 132* 133*  K 4.8 4.2 4.2  CL 97* 97* 95*  CO2 '25 25 31  '$ BUN 26* 19 23*  CREATININE 0.97 0.69 0.78  GLUCOSE 122* 146* 94    Electrolytes  Recent Labs Lab 12/18/15 1149 12/19/15 0319 12/20/15 0310  CALCIUM 9.2 8.7* 8.8*  MG 1.7  --   --   PHOS 3.5  --   --     CBC  Recent Labs Lab 12/18/15 1149 12/19/15 0319 12/20/15 0310  WBC 8.8 10.2 11.9*  HGB 13.1 12.5* 13.5  HCT 38.7* 37.5* 40.5  PLT 283 276 286    Coag's  Recent Labs Lab 12/18/15 1149  INR 1.12    Sepsis Markers  Recent Labs Lab 12/18/15 1149  LATICACIDVEN 1.0  PROCALCITON <0.10    ABG No results for input(s): PHART, PCO2ART, PO2ART in the last 168 hours.  Liver Enzymes  Recent Labs Lab 12/18/15 1149  AST  23  ALT 25  ALKPHOS 47  BILITOT 0.6  ALBUMIN 3.7    Cardiac Enzymes  Recent Labs Lab 12/18/15 1149  TROPONINI <0.03    Glucose No results for input(s): GLUCAP in the last 168 hours.  Imaging Dg Chest Port 1 View  Result Date: 12/20/2015 CLINICAL DATA:  Bronchiectasis EXAM: PORTABLE CHEST 1 VIEW COMPARISON:  12/18/2015 FINDINGS: Normal heart size. Bibasilar scarring versus subsegmental atelectasis. No obviate pneumothorax. No pleural effusion. Lung apices obscured by the patient's head and neck. Lumbar fusion hardware across the thoracolumbar junction. IMPRESSION: Subsegmental atelectasis at the bases. Electronically Signed   By: Marybelle Killings M.D.   On: 12/20/2015 07:58     STUDIES:  CXR 11/7 >>  no acute process (images personally reviewed), minimal atelectasis vs scarring on L   CULTURES: BCx 11/7 >> 11/9 Sp cx 11/7 >> UC 11/7 >> negative  ANTIBIOTICS: Vanco 11/7 >> Zosyn 11/7 >>  SIGNIFICANT EVENTS: 11/07  Admit with AMS, concern for respiratory failure 11/09  Tx out of SDU to med floor   LINES/TUBES:   DISCUSSION: 70 year old with bronchiectasis, recurrent exacerbation. Admitted with an acute exacerbation after he failed outpatient Levaquin and prednisone.   ASSESSMENT / PLAN:  PULMONARY A: Bronchiectasis with acute exacerbation Multiple aspiration PNAs COPD on home o2 at night On chronic prednisone 5 mg at home H/O IA Lung cancer  P:   Pulmonary hygiene - IS, mobilize, percussion  See ID Continue solumedrol, reduce to 40 mg IV Q12 Continue pulmicort and duonebs Follow cultures, PCT Chest PT > pt does not want to do more than 2x per day Monitor for intubation need  CARDIOVASCULAR A:  Afib Combined systolic diastolic heart failure H/O CAD - no evidence of ischemia on recent stress test HTN HLD P:  Continue pradaxa Continue lopressor, cardizem   Continue tele monitoring   RENAL A:   Mild Hyponatremia  P:   Monitor urine output and Cr BMP in am  Replace electrolytes as indicated  GASTROINTESTINAL A:   Hx Recurrent Aspiration  Dysphagia - on pureed diet P:   Pureed diet  Aspiration precautions   HEMATOLOGIC A:   Chronic Anticoagulation P:  Trend CBC Home Pradaxa > DVT prophylaxis   INFECTIOUS A:   Bronchiectasis exacerbation - wonder if this was actual failure of outpatient therapy vs slow to resolve, 5 days outpatient levaquin  Hx Recurrent aspiration PNA P:   Abx as above, narrow 11/9 Follow cultures, PCT, LA D7/x abx (counting outpatient therapy)  ENDOCRINE A:   Steroid Induced Hyperglycemia  P:   Monitor BS while on streoids  NEUROLOGIC A:   Prior h/o stoke Altered mental status - resolved on admit to SDU P:    Monitor neuro status    FAMILY  - Updates: Patient updated at bedside 11/9.  - Inter-disciplinary family meet or Palliative Care meeting due by:  11/14    [name], NP-C [place] Pgr: [phone] or if no answer [phone] 12/20/2015, 8:13 AM

## 2015-12-20 NOTE — Progress Notes (Signed)
Pt refused to do vest therapy at this time.

## 2015-12-20 NOTE — Progress Notes (Signed)
Date: December 20, 2015 Discharge orders checked for needs. No case management needs present at time of discharge. Velva Harman, RN, BSN, [place]   [phone]

## 2015-12-20 NOTE — Telephone Encounter (Signed)
Information provided by wife, [name].   Transition Care Management Follow-up Telephone Call   Date discharged? 12/20/15   How have you been since you were released from the hospital? "pretty good"   Do you understand why you were in the hospital? yes, "either pneumonia or a bronchial episode"   Do you understand the discharge instructions? yes   Where were you discharged to? Home   Items Reviewed:  Medications reviewed: yes, no changes except increase of Prednisone to '10mg'$ /daily.   Allergies reviewed: yes  Dietary changes reviewed: no  Referrals reviewed: no   Functional Questionnaire:   Activities of Daily Living (ADLs):   He states they are independent in the following: ambulation, bathing and hygiene, feeding, continence, grooming, toileting and dressing States they require assistance with the following: none   Any transportation issues/concerns?: no, may have issues next month. Wife is having surgery on 12/6, will not be able to drive.    Any patient concerns? no   Confirmed importance and date/time of follow-up visits scheduled yes  Provider Appointment booked with PCP on Wednesday, 11/15 at 11am.   Confirmed with patient if condition begins to worsen call PCP or go to the ER.  Patient was given the office number and encouraged to call back with question or concerns.  : yes

## 2015-12-20 NOTE — Consult Note (Signed)
   [facility] Inpatient Consult   12/20/2015  KATO WIECZOREK January 01, 1946 [id number]   Patient is active with [facility]. He has been followed by [place]. Went to bedside to speak with Mr. Sittner. However, he was discharged. Will request to be assigned to [place] for follow up.   Marthenia Rolling, MSN-Ed, RN,BSN [facility] Liaison [phone]

## 2015-12-21 LAB — LEGIONELLA PNEUMOPHILA SEROGP 1 UR AG: L. PNEUMOPHILA SEROGP 1 UR AG: NEGATIVE

## 2015-12-23 LAB — CULTURE, BLOOD (ROUTINE X 2)
Culture: NO GROWTH
Culture: NO GROWTH

## 2015-12-24 ENCOUNTER — Other Ambulatory Visit: Payer: Self-pay | Admitting: *Deleted

## 2015-12-24 NOTE — Patient Outreach (Signed)
[facility]) Care Management  12/24/2015  Malik Zuniga [birth year] [id number]   Referral received to start transition of care program after member discharged from hospital.  He was admitted on 48/2 with complications of COPD and bronchiectaisis.  He was discharged on 11/9.  He is known to [facility] as he has been involved with community care management in the past and most recently with the health coach.    Call placed to member to initiate transition of care program.  Permission granted to speak to primary care giver, wife Malik Zuniga.  She state that the member is doing much better since his discharge.  She state that the member went almost a month without his percussion vest due to equipment problems, but has restarted his treatment as instructed, twice a day.  She feel this is the reason for his admit.  She state that he has a follow up with his primary MD on Wednesday, and with the pulmonary specialist within the next 2 weeks.    Patient was recently discharged from hospital and all medications have been reviewed.  Wife denies any questions/concerns regarding meds.  Wife does confirm that she will have surgery on 12/6 (informed by health coach, F. Pleasant).  She state that their church has set up a schedule to provide the member with care while she is hospitalized and recovering.  This assistance with include providing help with his percussion vest twice a day.  She denies any concerns but state that she the member may need a home visit during that time.  She denies the need for a home visit immediately, but agrees to weekly calls until she goes for her surgery.  Will follow up next week.  Valente Chipper, [place], MSN [place] [phone]

## 2015-12-26 ENCOUNTER — Ambulatory Visit (INDEPENDENT_AMBULATORY_CARE_PROVIDER_SITE_OTHER): Payer: Medicare Other | Admitting: Family Medicine

## 2015-12-26 ENCOUNTER — Encounter: Payer: Self-pay | Admitting: Family Medicine

## 2015-12-26 VITALS — BP 110/60 | HR 85 | Temp 98.1°F | Resp 15 | Ht 69.0 in

## 2015-12-26 DIAGNOSIS — J471 Bronchiectasis with (acute) exacerbation: Secondary | ICD-10-CM

## 2015-12-26 NOTE — Progress Notes (Signed)
Pre visit review using our clinic review tool, if applicable. No additional management support is needed unless otherwise documented below in the visit note. 

## 2015-12-26 NOTE — Assessment & Plan Note (Signed)
Recurrent issue for pt.  He was d/c'd from his most recent hospitalization on 11/9.  D/C summary and CXR reviewed.  Pt is not currently on abx.  On Prednisone '10mg'$  daily currently due to recent exacerbation.  I see that the plan is to wean him back to '5mg'$  daily but given the complicated social situation (wife is only caregiver and she has major surgery upcoming on 12/6 and neither husband or wife can afford for him to have a setback during this time) so it may be worthwhile for him to remain on '10mg'$  until the end of the year- but will defer to Dr [name] and the pulmonary team.  Reviewed supportive care and red flags that should prompt return.  Pt expressed understanding and is in agreement w/ plan.

## 2015-12-26 NOTE — Patient Instructions (Signed)
Follow up w/ Dr [name] as scheduled Continue the '10mg'$  prednisone until told differently Continue to use the vest and the nebulizer as directed No antibiotics are needed at this time Call with any questions or concerns Hang in there!!! Happy Holidays!!!

## 2015-12-26 NOTE — Progress Notes (Signed)
   Subjective:    Patient ID: Malik Zuniga, male    DOB: Jan 10, 1946, 70 y.o.   MRN: [id number]  HPI Hospital f/u for bronchiectasis- pt was hospitalized 11/7-9 for acute bronchiectasis after failing outpt course of Levaquin.  Was admitted for IV Vanc/Zosyn, pulmonary toilet, and increased dose of Prednisone.  He was not able to use his pulmonary vest for the month prior to admission b/c dog ate the cord and it took that long to get a replacement.  Pt was sent home w/o abx b/c d/c CXR was clear.  Pt is interested in staying on Prednisone as a maintenance medication b/c he 'feels better on it'.  Pt is aware of side effects but 'is willing to trade the side effects for the benefits'.  Pt would like to stay on '10mg'$  daily.  Pt reports he is doing the vest twice daily and nebulizer txs 3x/day.  Pt reports breathing is 'real good' right now 'but my lungs are still full of crap'.  No fevers.     Review of Systems For ROS see HPI     Objective:   Physical Exam  Constitutional: He is oriented to person, place, and time. He appears well-developed and well-nourished. No distress.  HENT:  Head: Normocephalic and atraumatic.  Eyes: Conjunctivae and EOM are normal. Pupils are equal, round, and reactive to light.  Cardiovascular: Normal rate, regular rhythm and normal heart sounds.   Pulmonary/Chest: Effort normal and breath sounds normal. No respiratory distress. He has no wheezes. He has no rales.  Neurological: He is alert and oriented to person, place, and time.  Skin: Skin is warm and dry.  Psychiatric: He has a normal mood and affect. His behavior is normal. Thought content normal.  Vitals reviewed.         Assessment & Plan:

## 2015-12-28 ENCOUNTER — Encounter: Payer: Self-pay | Admitting: *Deleted

## 2015-12-31 ENCOUNTER — Other Ambulatory Visit: Payer: Self-pay | Admitting: *Deleted

## 2015-12-31 DIAGNOSIS — H2513 Age-related nuclear cataract, bilateral: Secondary | ICD-10-CM | POA: Diagnosis not present

## 2015-12-31 NOTE — Patient Outreach (Signed)
[facility]) Care Management  12/31/2015  MIKAEEL PETROW [birth year] [id number]   Weekly transition of care call placed to member/wife, no answer.  HIPAA compliant voice message left.  Will await call back, if no call back will follow up next week.  Valente Jens, [place], MSN [place] [phone]

## 2016-01-01 DIAGNOSIS — F331 Major depressive disorder, recurrent, moderate: Secondary | ICD-10-CM | POA: Diagnosis not present

## 2016-01-07 ENCOUNTER — Ambulatory Visit: Payer: Self-pay | Admitting: *Deleted

## 2016-01-09 ENCOUNTER — Other Ambulatory Visit: Payer: Self-pay | Admitting: *Deleted

## 2016-01-09 ENCOUNTER — Ambulatory Visit (INDEPENDENT_AMBULATORY_CARE_PROVIDER_SITE_OTHER): Payer: Medicare Other | Admitting: Pulmonary Disease

## 2016-01-09 ENCOUNTER — Encounter: Payer: Self-pay | Admitting: Pulmonary Disease

## 2016-01-09 ENCOUNTER — Encounter: Payer: Self-pay | Admitting: *Deleted

## 2016-01-09 DIAGNOSIS — I2584 Coronary atherosclerosis due to calcified coronary lesion: Secondary | ICD-10-CM

## 2016-01-09 DIAGNOSIS — J449 Chronic obstructive pulmonary disease, unspecified: Secondary | ICD-10-CM | POA: Diagnosis not present

## 2016-01-09 DIAGNOSIS — I2510 Atherosclerotic heart disease of native coronary artery without angina pectoris: Secondary | ICD-10-CM

## 2016-01-09 DIAGNOSIS — J471 Bronchiectasis with (acute) exacerbation: Secondary | ICD-10-CM | POA: Diagnosis not present

## 2016-01-09 MED ORDER — PREDNISONE 10 MG PO TABS: ORAL_TABLET | ORAL | 0 refills | Status: DC

## 2016-01-09 NOTE — Patient Outreach (Signed)
[facility]) Care Management   01/09/2016  Malik Zuniga July 31, 1945 [id number]  Malik Zuniga is an 70 y.o. male  Subjective:   Member reports that he is progressing well.  He state that he has been using his nebulizer and pulmonary vest as prescribed "for the most part."  He is advised to continue to use as instructed as to decrease risk of readmission.  He confirms that he attended his follow up visit with the pulmonary specialist today, no immediate concerns.  Objective:   ROS  Physical Exam  Encounter Medications:   Outpatient Encounter Prescriptions as of 01/09/2016  Medication Sig Note  . ARIPiprazole (ABILIFY) 10 MG tablet Take 10 mg by mouth daily.    . Ascorbic Acid (VITAMIN C) 1000 MG tablet Take 1,000 mg by mouth daily.   . Aspirin-Acetaminophen-Caffeine (HEADACHE RELIEF PO) Take 1-2 tablets by mouth every 6 (six) hours as needed (headaches).   Marland Kitchen atorvastatin (LIPITOR) 20 MG tablet Take 1 tablet (20 mg total) by mouth daily at 6 PM.   . budesonide (PULMICORT) 0.25 MG/2ML nebulizer solution Take 4 mLs (0.5 mg total) by nebulization 2 (two) times daily.   . cholecalciferol (VITAMIN D) 1000 UNITS tablet Take 1,000 Units by mouth every morning.    . dabigatran (PRADAXA) 150 MG CAPS capsule Take 1 capsule (150 mg total) by mouth every 12 (twelve) hours.   . diazepam (VALIUM) 5 MG tablet Take 1 tablet (5 mg total) by mouth every 8 (eight) hours as needed for anxiety or muscle spasms. scheduled   . diltiazem (CARDIZEM CD) 240 MG 24 hr capsule Take 1 capsule (240 mg total) by mouth daily.   . divalproex (DEPAKOTE) 500 MG 24 hr tablet Take 1,000 mg by mouth at bedtime.    . docusate sodium (COLACE) 100 MG capsule Take 200 mg by mouth at bedtime.    . DULoxetine (CYMBALTA) 60 MG capsule Take 120 mg by mouth daily.    . feeding supplement, ENSURE ENLIVE, (ENSURE ENLIVE) LIQD Take 237 mLs by mouth 2 (two) times daily between meals.   . fluticasone (FLONASE) 50  MCG/ACT nasal spray PLACE 2 SPRAYS INTO BOTH NOSTRILS DAILY. (Patient taking differently: PLACE 2 SPRAYS INTO BOTH NOSTRILS DAILY as needed for allergies)   . guaiFENesin (MUCINEX) 600 MG 12 hr tablet Take 1,200 mg by mouth 2 (two) times daily as needed for cough or to loosen phlegm. Reported on 08/13/2015   . guaiFENesin (ROBITUSSIN) 100 MG/5ML SOLN Take 5 mLs (100 mg total) by mouth every 4 (four) hours as needed for cough or to loosen phlegm.   Marland Kitchen HYDROcodone-acetaminophen (NORCO) 10-325 MG per tablet Take 1 tablet by mouth every 4 (four) hours as needed for moderate pain. for pain 12/01/2014: .   . ipratropium-albuterol (DUONEB) 0.5-2.5 (3) MG/3ML SOLN Take 3 mLs by nebulization every 6 (six) hours.   . metoprolol (LOPRESSOR) 50 MG tablet Take 1 tablet (50 mg total) by mouth 2 (two) times daily.   . montelukast (SINGULAIR) 10 MG tablet TAKE 1 TABLET AT BEDTIME   . nitroGLYCERIN (NITROSTAT) 0.4 MG SL tablet PLACE 1 TABLET (0.4 MG TOTAL) UNDER THE TONGUE EVERY 5 (FIVE) MINUTES AS NEEDED FOR CHEST PAIN.   Marland Kitchen omeprazole (PRILOSEC) 40 MG capsule Take 40 mg by mouth 2 (two) times daily.    . polyethylene glycol (MIRALAX / GLYCOLAX) packet Take 17 g by mouth 2 (two) times daily as needed for moderate constipation. For constipation    . predniSONE (  DELTASONE) 10 MG tablet 4 tabs x 2 days, then 3 tabs x2 days, 2 tabs x2 days, 1 tab x2 days and then 5 mg daily   . primidone (MYSOLINE) 250 MG tablet TAKE 1 TABLET TWICE DAILY (Patient taking differently: TAKE '250mg'$  TABLET by mouth TWICE DAILY)   . promethazine (PHENERGAN) 25 MG tablet Take 25 mg by mouth every 6 (six) hours as needed for nausea or vomiting. Reported on 04/06/2015 12/01/2014: .   . ranitidine (ZANTAC) 150 MG tablet Take 150-300 mg by mouth 2 (two) times daily as needed for heartburn.   Marland Kitchen tiZANidine (ZANAFLEX) 4 MG tablet TAKE 1 TABLET BY MOUTH EVERY 8 HOURS AS NEEDED FOR MUSCLE SPASMS. (Patient taking differently: TAKE '4mg'$  TABLET BY MOUTH EVERY 8  HOURS AS NEEDED FOR MUSCLE SPASMS.)   . Zinc 50 MG TABS Take 50 mg by mouth daily.    . [DISCONTINUED] predniSONE (DELTASONE) 10 MG tablet 4 tabs x 2 days, then 3 tabs x2 days, 2 tabs x2 days, 1 tab x2 days and then 5 mg daily    No facility-administered encounter medications on file as of 01/09/2016.     Functional Status:   In your present state of health, do you have any difficulty performing the following activities: 01/09/2016 12/18/2015  Hearing? [place]? N -  Difficulty concentrating or making decisions? Y -  Walking or climbing stairs? Y -  Dressing or bathing? Y -  Doing errands, shopping? [name]  Preparing Food and eating ? Y -  Using the Toilet? N -  In the past six months, have you accidently leaked urine? N -  Do you have problems with loss of bowel control? N -  Managing your Medications? Y -  Managing your Finances? N -  Housekeeping or managing your Housekeeping? Y -  Some recent data might be hidden    Fall/Depression Screening:    PHQ 2/9 Scores 12/13/2015 11/12/2015 08/21/2015 06/07/2015 04/26/2015 03/30/2015 03/02/2015  PHQ - 2 Score '1 1 3 '$ 0 '4 1 1  '$ PHQ- 9 Score - - 15 - 17 - -   Fall Risk  01/09/2016 12/13/2015 11/12/2015 09/14/2015 08/21/2015  Falls in the past year? Yes Yes Yes Yes Yes  Number falls in past yr: 2 or more 2 or more 2 or more 2 or more 2 or more  Injury with Fall? No (No Data) Yes No Yes  Risk Factor Category  High Fall Risk - High Fall Risk High Fall Risk High Fall Risk  Risk for fall due to : History of fall(s) Impaired balance/gait;History of fall(s);Impaired mobility Impaired balance/gait;Impaired mobility;History of fall(s) - History of fall(s)  Risk for fall due to (comments): - - - - -  Follow up Falls prevention discussed Education provided;Falls prevention discussed - Falls evaluation completed Falls prevention discussed    Assessment:    Member confirms wife surgery for next week, state that his son will now come to town to stay with him  during his wife's recovery.  He will also be available to assist with medication management as well as pulmonary therapy.  Discussed the possibility of home visit while wife is recovering, as discussed with wife, member prefer to have a follow up call next week then determine if home visit is needed at that time.  Plan:   Will follow up next week with weekly transition of care call.  [facility] CM Care Plan Problem One   Flowsheet Row Most Recent Value  Care Plan  Problem One  Risk for readmission related to chronic bronchiectasis as evidenced by recent hospital admission  Role Documenting the Problem One  Care Management [place] for Problem One  Active  [facility] Long Term Goal (31-90 days)  Member will not be readmitted to hospital within the next 31 days  [facility] Long Term Goal Start Date  12/24/15  Interventions for Problem One Long Term Goal  Discussed with member the importance of following discharge instructions, including follow up appointments, medications, diet, to decrease the risk of readmission  [facility] CM Short Term Goal #1 (0-30 days)  Member will report using pulmonary therapy vest as instructed, twice a day, over the next 4 weeks  [facility] CM Short Term Goal #1 Start Date  12/24/15  Interventions for Short Term Goal #1  Educated wife on importance of using vest as instructed, as vest help with secretions.  Confirmed with wife that member has all parts for machine and vest is in good working condition  [facility] CM Short Term Goal #2 (0-30 days)  Member will keep and attend follow up appointments with primary MD and pulmonary MD within the next 4 weeks  [facility] CM Short Term Goal #2 Start Date  12/24/15  [facility] CM Short Term Goal #2 Met Date  01/09/16  Interventions for Short Term Goal #2  Educated family on importance of follow up appointments as it aides in decreasing risk for readmission.  Confirmed with wife that member has transportation for appointments     Valente Todd, Therapist, sports, MSN [place] Manager [phone]

## 2016-01-09 NOTE — Progress Notes (Signed)
Subjective:    Patient ID: Malik Zuniga, male    DOB: 1945-04-24, 70 y.o.   MRN: [id number]  Synopsis: former patient of Dr. 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   Malik Zuniga 04/2009:  FEV1 1.65 (47%), FEV1% 49  CT chest 10/2013:  RLL infiltrates CXR 10/2013:  LLL infiltrate +h/o aspiration in the past.  +speech therapy evaluation with recs given.  Admitted multiple times in 2017 for recurrent aspiration pneumonia. Started therapy vest 2017  HPI  Chief Complaint  Patient presents with  . Follow-up    2 month follow up, breathing has been ok for the past few months.    Malik Zuniga didn't get to use his therapy vest for a month and he ended up hospitalized for 48 hours. He is doing better now.  He says that he will sometimes skip the vest due to pain and sometimes he will only use his nebulizer 2 times per day.  He is still taking prednisone 5 mg daily.  He is still bringing up mucus daily, typically light brown.  He doesn't feel short of breath, breathing is good right now.  No cough right now outside of using the vest.    Past Medical History:  Diagnosis Date  . Anemia associated with acute blood loss   . Angioedema 10/31/2010  . Anxiety    takes Valium daily  . Aspiration pneumonia ([place]) 2010  . Asthma   . Bacteremia due to Pseudomonas 10/04/2011   D/t gram neg rods   . 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 Chronic back pain    compression fracture  . Constipation    takes Colace daily as well as Miralax  . Coronary artery disease   . Depression    takes Cymbalta daily  . Emphysema of lung (HCC)    Albuterol as needed;Symbicort daily and Singulair at bedtime  . GERD (gastroesophageal reflux disease)    takes Omeprazole daily  . Headache, chronic daily   . History of bronchitis   . History of colon polyps   .  History of migraine    last migraine a couple of days ago;takes Excedrin Migraine  . History of prostate cancer 2004  . Hyperlipidemia    takes Simvastatin daily  . Hypertension    takes Metoprolol daily as well as Hyzaar  . Insomnia    takes Benadryl nightly  . Lung cancer ([place]) 2016  . Mood change ([place])    after Brain surgery mood changed and was placed on Depakote  . Myocardial infarction 04/1998  . Neuromuscular disorder ([place]) 1998   right carpal tunnel release  . On home O2   . Shortness of breath    with exertion  . Stroke [facility]) 1998   Brain Aneurysm  . Tremor         Review of Systems  Constitutional: Negative for chills, fatigue and fever.  HENT: Negative for postnasal drip, rhinorrhea and sinus pressure.   Respiratory: Positive for cough and shortness of breath. Negative for wheezing.        Objective:   Physical Exam Vitals:   01/09/16 0924  BP: 120/76  Pulse: 80  SpO2: 94%  Height: '5\' 9"'$  (1.753 m)   RA  Gen: chronically ill appearing, in wheelchair HENT: OP clear, TM's clear, neck  supple PULM: CTA B, normal percussion and effort again today CV: RRR, no mgr, trace edema GI: BS+, soft, nontender Derm: no cyanosis or rash Psyche: normal mood and affect Neuro: strength 4+/5 bilaterally grip strength   Hospital records from his November 2017 hospitalization for tracheobronchitis reviewed. It appears that it was primarily her bronchiectasis exacerbation brought on by lack of therapy vest use at home. He had improvement in symptoms with just 2 days of antibiotics and chest PT in the hospital.      Assessment & Plan:  Bronchiectasis without acute exacerbation Malik Zuniga) Malik Zuniga had an exacerbation of his bronchiectasis or leading to a hospitalization in November 2017. This appears to be due to the fact that he was unable to use his there be vest for about a month. In hospital he was able to recover quickly with antibiotics and his therapy vest again. He is  currently doing well.  Notably, he feels that he does best when taking 5 mg of prednisone daily. We again talked about the fact that I feel that this increases his risk for complication overall but I agreed to maintain a given his recent exacerbation.  Plan: Continue therapy vest twice a day Continue DuoNeb 4 times a day Continue prednisone 5 mg daily for the next 2 months, we'll discuss going to every other day on the next visit  COPD   Flu shot is up-to-date. Plan as detailed above.    Current Outpatient Prescriptions:  .  ARIPiprazole (ABILIFY) 10 MG tablet, Take 10 mg by mouth daily. , Disp: , Rfl:  .  Ascorbic Acid (VITAMIN C) 1000 MG tablet, Take 1,000 mg by mouth daily., Disp: , Rfl:  .  Aspirin-Acetaminophen-Caffeine (HEADACHE RELIEF PO), Take 1-2 tablets by mouth every 6 (six) hours as needed (headaches)., Disp: , Rfl:  .  atorvastatin (LIPITOR) 20 MG tablet, Take 1 tablet (20 mg total) by mouth daily at 6 PM., Disp: 30 tablet, Rfl: 6 .  budesonide (PULMICORT) 0.25 MG/2ML nebulizer solution, Take 4 mLs (0.5 mg total) by nebulization 2 (two) times daily., Disp: 60 mL, Rfl: 3 .  cholecalciferol (VITAMIN D) 1000 UNITS tablet, Take 1,000 Units by mouth every morning. , Disp: , Rfl:  .  dabigatran (PRADAXA) 150 MG CAPS capsule, Take 1 capsule (150 mg total) by mouth every 12 (twelve) hours., Disp: 60 capsule, Rfl: 11 .  diazepam (VALIUM) 5 MG tablet, Take 1 tablet (5 mg total) by mouth every 8 (eight) hours as needed for anxiety or muscle spasms. scheduled, Disp: 30 tablet, Rfl: 0 .  diltiazem (CARDIZEM CD) 240 MG 24 hr capsule, Take 1 capsule (240 mg total) by mouth daily., Disp: 30 capsule, Rfl: 6 .  divalproex (DEPAKOTE) 500 MG 24 hr tablet, Take 1,000 mg by mouth at bedtime. , Disp: , Rfl:  .  docusate sodium (COLACE) 100 MG capsule, Take 200 mg by mouth at bedtime. , Disp: , Rfl:  .  DULoxetine (CYMBALTA) 60 MG capsule, Take 120 mg by mouth daily. , Disp: , Rfl:  .  feeding  supplement, ENSURE ENLIVE, (ENSURE ENLIVE) LIQD, Take 237 mLs by mouth 2 (two) times daily between meals., Disp: 237 mL, Rfl: 12 .  fluticasone (FLONASE) 50 MCG/ACT nasal spray, PLACE 2 SPRAYS INTO BOTH NOSTRILS DAILY. (Patient taking differently: PLACE 2 SPRAYS INTO BOTH NOSTRILS DAILY as needed for allergies), Disp: 16 g, Rfl: 2 .  guaiFENesin (MUCINEX) 600 MG 12 hr tablet, Take 1,200 mg by mouth 2 (two) times daily as needed  for cough or to loosen phlegm. Reported on 08/13/2015, Disp: , Rfl:  .  guaiFENesin (ROBITUSSIN) 100 MG/5ML SOLN, Take 5 mLs (100 mg total) by mouth every 4 (four) hours as needed for cough or to loosen phlegm., Disp: 236 mL, Rfl: 0 .  HYDROcodone-acetaminophen (NORCO) 10-325 MG per tablet, Take 1 tablet by mouth every 4 (four) hours as needed for moderate pain. for pain, Disp: , Rfl: 0 .  ipratropium-albuterol (DUONEB) 0.5-2.5 (3) MG/3ML SOLN, Take 3 mLs by nebulization every 6 (six) hours., Disp: 360 mL, Rfl: 3 .  metoprolol (LOPRESSOR) 50 MG tablet, Take 1 tablet (50 mg total) by mouth 2 (two) times daily., Disp: 180 tablet, Rfl: 3 .  montelukast (SINGULAIR) 10 MG tablet, TAKE 1 TABLET AT BEDTIME, Disp: 90 tablet, Rfl: 1 .  nitroGLYCERIN (NITROSTAT) 0.4 MG SL tablet, PLACE 1 TABLET (0.4 MG TOTAL) UNDER THE TONGUE EVERY 5 (FIVE) MINUTES AS NEEDED FOR CHEST PAIN., Disp: 25 tablet, Rfl: 2 .  omeprazole (PRILOSEC) 40 MG capsule, Take 40 mg by mouth 2 (two) times daily. , Disp: , Rfl:  .  polyethylene glycol (MIRALAX / GLYCOLAX) packet, Take 17 g by mouth 2 (two) times daily as needed for moderate constipation. For constipation , Disp: , Rfl:  .  predniSONE (DELTASONE) 10 MG tablet, 4 tabs x 2 days, then 3 tabs x2 days, 2 tabs x2 days, 1 tab x2 days and then 5 mg daily, Disp: 20 tablet, Rfl: 0 .  primidone (MYSOLINE) 250 MG tablet, TAKE 1 TABLET TWICE DAILY (Patient taking differently: TAKE '250mg'$  TABLET by mouth TWICE DAILY), Disp: 180 tablet, Rfl: 1 .  promethazine (PHENERGAN) 25 MG  tablet, Take 25 mg by mouth every 6 (six) hours as needed for nausea or vomiting. Reported on 04/06/2015, Disp: , Rfl: 1 .  ranitidine (ZANTAC) 150 MG tablet, Take 150-300 mg by mouth 2 (two) times daily as needed for heartburn., Disp: , Rfl:  .  tiZANidine (ZANAFLEX) 4 MG tablet, TAKE 1 TABLET BY MOUTH EVERY 8 HOURS AS NEEDED FOR MUSCLE SPASMS. (Patient taking differently: TAKE '4mg'$  TABLET BY MOUTH EVERY 8 HOURS AS NEEDED FOR MUSCLE SPASMS.), Disp: 90 tablet, Rfl: 1 .  Zinc 50 MG TABS, Take 50 mg by mouth daily. , Disp: , Rfl:

## 2016-01-09 NOTE — Patient Instructions (Signed)
Please use your therapy vest twice a day Keep using your nebulized medicines as you're doing Keep taking your prednisone as you are doing I'll see you back in 2 months or sooner if needed

## 2016-01-09 NOTE — Assessment & Plan Note (Signed)
Flu shot is up-to-date. Plan as detailed above.

## 2016-01-09 NOTE — Addendum Note (Signed)
Addended by: Valerie Salts on: 01/09/2016 09:56 AM   Modules accepted: Orders

## 2016-01-09 NOTE — Assessment & Plan Note (Signed)
Malik Zuniga had an exacerbation of his bronchiectasis or leading to a hospitalization in November 2017. This appears to be due to the fact that he was unable to use his there be vest for about a month. In hospital he was able to recover quickly with antibiotics and his therapy vest again. He is currently doing well.  Notably, he feels that he does best when taking 5 mg of prednisone daily. We again talked about the fact that I feel that this increases his risk for complication overall but I agreed to maintain a given his recent exacerbation.  Plan: Continue therapy vest twice a day Continue DuoNeb 4 times a day Continue prednisone 5 mg daily for the next 2 months, we'll discuss going to every other day on the next visit

## 2016-01-11 ENCOUNTER — Ambulatory Visit: Payer: Self-pay | Admitting: *Deleted

## 2016-01-15 ENCOUNTER — Ambulatory Visit: Payer: Self-pay | Admitting: *Deleted

## 2016-01-17 ENCOUNTER — Other Ambulatory Visit: Payer: Self-pay | Admitting: *Deleted

## 2016-01-17 NOTE — Patient Outreach (Signed)
[place] [facility]) Care Management  01/17/2016  [name] [birth year] [id number]   Weekly transition of care call placed to member.  He report that he continue to use his nebulizer and pulmonary vest without problems, denies any difficulty breathing.  He state that he is aware that it is imperative to use as instructed to decrease risk for readmission.  He confirms that his wife is not in the hospital and that his son is at the home with him for support.  He state that he does not know when she will be discharged, but she will remain there until at least next week.  He report that he will continue to have the support from his son and church family while she is recovering.  He does agree to home visit next week.  Valente Tavarius, [place], MSN [place] [phone]

## 2016-01-21 ENCOUNTER — Encounter (HOSPITAL_COMMUNITY): Payer: Self-pay | Admitting: Emergency Medicine

## 2016-01-21 ENCOUNTER — Emergency Department (HOSPITAL_COMMUNITY): Payer: Medicare Other

## 2016-01-21 ENCOUNTER — Telehealth: Payer: Self-pay | Admitting: Pulmonary Disease

## 2016-01-21 ENCOUNTER — Other Ambulatory Visit: Payer: Self-pay | Admitting: Cardiology

## 2016-01-21 ENCOUNTER — Inpatient Hospital Stay (HOSPITAL_COMMUNITY)
Admission: EM | Admit: 2016-01-21 | Discharge: 2016-01-24 | DRG: 177 | Disposition: A | Discharge status: Home / Self Care | Payer: Medicare Other | Attending: Internal Medicine | Admitting: Internal Medicine

## 2016-01-21 DIAGNOSIS — J9621 Acute and chronic respiratory failure with hypoxia: Secondary | ICD-10-CM | POA: Diagnosis not present

## 2016-01-21 DIAGNOSIS — J189 Pneumonia, unspecified organism: Secondary | ICD-10-CM | POA: Diagnosis present

## 2016-01-21 DIAGNOSIS — F319 Bipolar disorder, unspecified: Secondary | ICD-10-CM | POA: Diagnosis present

## 2016-01-21 DIAGNOSIS — I110 Hypertensive heart disease with heart failure: Secondary | ICD-10-CM | POA: Diagnosis present

## 2016-01-21 DIAGNOSIS — F329 Major depressive disorder, single episode, unspecified: Secondary | ICD-10-CM | POA: Diagnosis present

## 2016-01-21 DIAGNOSIS — F419 Anxiety disorder, unspecified: Secondary | ICD-10-CM | POA: Diagnosis present

## 2016-01-21 DIAGNOSIS — R1311 Dysphagia, oral phase: Secondary | ICD-10-CM | POA: Diagnosis not present

## 2016-01-21 DIAGNOSIS — Z9119 Patient's noncompliance with other medical treatment and regimen: Secondary | ICD-10-CM

## 2016-01-21 DIAGNOSIS — Z955 Presence of coronary angioplasty implant and graft: Secondary | ICD-10-CM

## 2016-01-21 DIAGNOSIS — J441 Chronic obstructive pulmonary disease with (acute) exacerbation: Secondary | ICD-10-CM | POA: Diagnosis not present

## 2016-01-21 DIAGNOSIS — Z8546 Personal history of malignant neoplasm of prostate: Secondary | ICD-10-CM | POA: Diagnosis not present

## 2016-01-21 DIAGNOSIS — I252 Old myocardial infarction: Secondary | ICD-10-CM

## 2016-01-21 DIAGNOSIS — Z993 Dependence on wheelchair: Secondary | ICD-10-CM

## 2016-01-21 DIAGNOSIS — Z87891 Personal history of nicotine dependence: Secondary | ICD-10-CM | POA: Diagnosis not present

## 2016-01-21 DIAGNOSIS — J471 Bronchiectasis with (acute) exacerbation: Secondary | ICD-10-CM | POA: Diagnosis not present

## 2016-01-21 DIAGNOSIS — C3431 Malignant neoplasm of lower lobe, right bronchus or lung: Secondary | ICD-10-CM | POA: Diagnosis present

## 2016-01-21 DIAGNOSIS — J690 Pneumonitis due to inhalation of food and vomit: Secondary | ICD-10-CM | POA: Diagnosis not present

## 2016-01-21 DIAGNOSIS — J9611 Chronic respiratory failure with hypoxia: Secondary | ICD-10-CM | POA: Diagnosis present

## 2016-01-21 DIAGNOSIS — K219 Gastro-esophageal reflux disease without esophagitis: Secondary | ICD-10-CM | POA: Diagnosis present

## 2016-01-21 DIAGNOSIS — R1310 Dysphagia, unspecified: Secondary | ICD-10-CM | POA: Diagnosis present

## 2016-01-21 DIAGNOSIS — I480 Paroxysmal atrial fibrillation: Secondary | ICD-10-CM | POA: Diagnosis present

## 2016-01-21 DIAGNOSIS — R0602 Shortness of breath: Secondary | ICD-10-CM | POA: Diagnosis not present

## 2016-01-21 DIAGNOSIS — Z7901 Long term (current) use of anticoagulants: Secondary | ICD-10-CM

## 2016-01-21 DIAGNOSIS — G4700 Insomnia, unspecified: Secondary | ICD-10-CM | POA: Diagnosis present

## 2016-01-21 DIAGNOSIS — I482 Chronic atrial fibrillation: Secondary | ICD-10-CM | POA: Diagnosis present

## 2016-01-21 DIAGNOSIS — Z7952 Long term (current) use of systemic steroids: Secondary | ICD-10-CM

## 2016-01-21 DIAGNOSIS — R251 Tremor, unspecified: Secondary | ICD-10-CM | POA: Diagnosis present

## 2016-01-21 DIAGNOSIS — Z9981 Dependence on supplemental oxygen: Secondary | ICD-10-CM

## 2016-01-21 DIAGNOSIS — I2510 Atherosclerotic heart disease of native coronary artery without angina pectoris: Secondary | ICD-10-CM | POA: Diagnosis present

## 2016-01-21 DIAGNOSIS — G8929 Other chronic pain: Secondary | ICD-10-CM | POA: Diagnosis present

## 2016-01-21 DIAGNOSIS — Z808 Family history of malignant neoplasm of other organs or systems: Secondary | ICD-10-CM

## 2016-01-21 DIAGNOSIS — F310 Bipolar disorder, current episode hypomanic: Secondary | ICD-10-CM | POA: Diagnosis not present

## 2016-01-21 DIAGNOSIS — E785 Hyperlipidemia, unspecified: Secondary | ICD-10-CM | POA: Diagnosis present

## 2016-01-21 DIAGNOSIS — I10 Essential (primary) hypertension: Secondary | ICD-10-CM | POA: Diagnosis present

## 2016-01-21 DIAGNOSIS — M545 Low back pain, unspecified: Secondary | ICD-10-CM | POA: Diagnosis present

## 2016-01-21 DIAGNOSIS — I5042 Chronic combined systolic (congestive) and diastolic (congestive) heart failure: Secondary | ICD-10-CM | POA: Diagnosis present

## 2016-01-21 DIAGNOSIS — Z7951 Long term (current) use of inhaled steroids: Secondary | ICD-10-CM

## 2016-01-21 DIAGNOSIS — Z8673 Personal history of transient ischemic attack (TIA), and cerebral infarction without residual deficits: Secondary | ICD-10-CM

## 2016-01-21 DIAGNOSIS — Z8042 Family history of malignant neoplasm of prostate: Secondary | ICD-10-CM

## 2016-01-21 DIAGNOSIS — Z888 Allergy status to other drugs, medicaments and biological substances status: Secondary | ICD-10-CM

## 2016-01-21 HISTORY — DX: Bipolar disorder, unspecified: F31.9

## 2016-01-21 LAB — CBC WITH DIFFERENTIAL/PLATELET
BASOS ABS: 0 10*3/uL (ref 0.0–0.1)
Basophils Relative: 0 %
EOS ABS: 0.1 10*3/uL (ref 0.0–0.7)
EOS PCT: 1 %
HCT: 42.9 % (ref 39.0–52.0)
HEMOGLOBIN: 14.1 g/dL (ref 13.0–17.0)
LYMPHS ABS: 1.5 10*3/uL (ref 0.7–4.0)
LYMPHS PCT: 16 %
MCH: 29.5 pg (ref 26.0–34.0)
MCHC: 32.9 g/dL (ref 30.0–36.0)
MCV: 89.7 fL (ref 78.0–100.0)
Monocytes Absolute: 0.6 10*3/uL (ref 0.1–1.0)
Monocytes Relative: 6 %
NEUTROS PCT: 77 %
Neutro Abs: 7.3 10*3/uL (ref 1.7–7.7)
PLATELETS: 234 10*3/uL (ref 150–400)
RBC: 4.78 MIL/uL (ref 4.22–5.81)
RDW: 16.6 % — ABNORMAL HIGH (ref 11.5–15.5)
WBC: 9.5 10*3/uL (ref 4.0–10.5)

## 2016-01-21 LAB — BLOOD GAS, ARTERIAL
ACID-BASE EXCESS: 4.7 mmol/L — AB (ref 0.0–2.0)
BICARBONATE: 29.9 mmol/L — AB (ref 20.0–28.0)
Drawn by: 27605
O2 Content: 4 L/min
O2 Saturation: 91.4 %
PCO2 ART: 48.3 mmHg — AB (ref 32.0–48.0)
PH ART: 7.409 (ref 7.350–7.450)
PO2 ART: 65.4 mmHg — AB (ref 83.0–108.0)
Patient temperature: 98.6

## 2016-01-21 LAB — COMPREHENSIVE METABOLIC PANEL
ALK PHOS: 46 U/L (ref 38–126)
ALT: 21 U/L (ref 17–63)
AST: 25 U/L (ref 15–41)
Albumin: 4.1 g/dL (ref 3.5–5.0)
Anion gap: 9 (ref 5–15)
BUN: 14 mg/dL (ref 6–20)
CALCIUM: 9.4 mg/dL (ref 8.9–10.3)
CHLORIDE: 98 mmol/L — AB (ref 101–111)
CO2: 30 mmol/L (ref 22–32)
CREATININE: 0.82 mg/dL (ref 0.61–1.24)
GFR calc Af Amer: >60 mL/min (ref >60)
GFR calc non Af Amer: >60 mL/min (ref >60)
Glucose, Bld: 103 mg/dL — ABNORMAL HIGH (ref 65–99)
Potassium: 4.3 mmol/L (ref 3.5–5.1)
SODIUM: 137 mmol/L (ref 135–145)
Total Bilirubin: 0.7 mg/dL (ref 0.3–1.2)
Total Protein: 7 g/dL (ref 6.5–8.1)

## 2016-01-21 LAB — I-STAT CG4 LACTIC ACID, ED: Lactic Acid, Venous: 1.34 mmol/L (ref 0.5–1.9)

## 2016-01-21 LAB — I-STAT TROPONIN, ED: TROPONIN I, POC: 0 ng/mL (ref 0.00–0.08)

## 2016-01-21 LAB — BRAIN NATRIURETIC PEPTIDE: B Natriuretic Peptide: 55.8 pg/mL (ref 0.0–100.0)

## 2016-01-21 MED ORDER — ALBUTEROL SULFATE (2.5 MG/3ML) 0.083% IN NEBU: 2.5000 mg | INHALATION_SOLUTION | RESPIRATORY_TRACT | Status: DC | PRN

## 2016-01-21 MED ORDER — PRIMIDONE 250 MG PO TABS
250.0000 mg | ORAL_TABLET | Freq: Two times a day (BID) | ORAL | Status: DC
  Administered 2016-01-22 – 2016-01-24 (×6): 250 mg via ORAL
  Filled 2016-01-21 (×7): qty 1

## 2016-01-21 MED ORDER — IOPAMIDOL (ISOVUE-370) INJECTION 76%
INTRAVENOUS | Status: AC
  Filled 2016-01-21: qty 100

## 2016-01-21 MED ORDER — ARIPIPRAZOLE 5 MG PO TABS
5.0000 mg | ORAL_TABLET | Freq: Every day | ORAL | Status: DC
  Administered 2016-01-22 – 2016-01-24 (×3): 5 mg via ORAL
  Filled 2016-01-21 (×5): qty 1

## 2016-01-21 MED ORDER — DIVALPROEX SODIUM ER 500 MG PO TB24
1000.0000 mg | ORAL_TABLET | Freq: Every day | ORAL | Status: DC
  Administered 2016-01-22 – 2016-01-23 (×3): 1000 mg via ORAL
  Filled 2016-01-21 (×3): qty 2

## 2016-01-21 MED ORDER — MONTELUKAST SODIUM 10 MG PO TABS
10.0000 mg | ORAL_TABLET | Freq: Every day | ORAL | Status: DC
  Administered 2016-01-22 – 2016-01-23 (×3): 10 mg via ORAL
  Filled 2016-01-21 (×3): qty 1

## 2016-01-21 MED ORDER — DULOXETINE HCL 30 MG PO CPEP
120.0000 mg | ORAL_CAPSULE | Freq: Every day | ORAL | Status: DC
  Administered 2016-01-22 – 2016-01-24 (×3): 120 mg via ORAL
  Filled 2016-01-21 (×4): qty 4

## 2016-01-21 MED ORDER — METOPROLOL TARTRATE 50 MG PO TABS
50.0000 mg | ORAL_TABLET | Freq: Two times a day (BID) | ORAL | Status: DC
  Administered 2016-01-22 – 2016-01-24 (×6): 50 mg via ORAL
  Filled 2016-01-21 (×4): qty 1
  Filled 2016-01-21 (×2): qty 2
  Filled 2016-01-21: qty 1
  Filled 2016-01-21: qty 2

## 2016-01-21 MED ORDER — IOPAMIDOL (ISOVUE-370) INJECTION 76%
100.0000 mL | Freq: Once | INTRAVENOUS | Status: AC | PRN
  Administered 2016-01-21: 100 mL via INTRAVENOUS

## 2016-01-21 MED ORDER — SODIUM CHLORIDE 0.9 % IV SOLN
INTRAVENOUS | Status: AC
  Administered 2016-01-21: via INTRAVENOUS

## 2016-01-21 MED ORDER — VANCOMYCIN HCL IN DEXTROSE 1-5 GM/200ML-% IV SOLN
1000.0000 mg | Freq: Once | INTRAVENOUS | Status: AC
  Administered 2016-01-21: 1000 mg via INTRAVENOUS
  Filled 2016-01-21: qty 200

## 2016-01-21 MED ORDER — DILTIAZEM HCL ER COATED BEADS 240 MG PO CP24
240.0000 mg | ORAL_CAPSULE | Freq: Every day | ORAL | Status: DC
  Administered 2016-01-22 – 2016-01-24 (×4): 240 mg via ORAL
  Filled 2016-01-21 (×5): qty 1

## 2016-01-21 MED ORDER — DIAZEPAM 5 MG PO TABS
5.0000 mg | ORAL_TABLET | Freq: Three times a day (TID) | ORAL | Status: DC | PRN
  Administered 2016-01-22 – 2016-01-23 (×3): 5 mg via ORAL
  Filled 2016-01-21 (×3): qty 1

## 2016-01-21 MED ORDER — ATORVASTATIN CALCIUM 10 MG PO TABS
20.0000 mg | ORAL_TABLET | Freq: Every day | ORAL | Status: DC
  Administered 2016-01-22 – 2016-01-23 (×3): 20 mg via ORAL
  Filled 2016-01-21: qty 1
  Filled 2016-01-21: qty 2
  Filled 2016-01-21: qty 1
  Filled 2016-01-21 (×2): qty 2

## 2016-01-21 MED ORDER — IPRATROPIUM-ALBUTEROL 0.5-2.5 (3) MG/3ML IN SOLN
3.0000 mL | Freq: Four times a day (QID) | RESPIRATORY_TRACT | Status: DC
  Administered 2016-01-21: 3 mL via RESPIRATORY_TRACT
  Filled 2016-01-21: qty 3

## 2016-01-21 MED ORDER — IPRATROPIUM-ALBUTEROL 0.5-2.5 (3) MG/3ML IN SOLN
3.0000 mL | Freq: Once | RESPIRATORY_TRACT | Status: AC
  Administered 2016-01-21: 3 mL via RESPIRATORY_TRACT
  Filled 2016-01-21: qty 3

## 2016-01-21 MED ORDER — DABIGATRAN ETEXILATE MESYLATE 150 MG PO CAPS
150.0000 mg | ORAL_CAPSULE | Freq: Two times a day (BID) | ORAL | Status: DC
  Administered 2016-01-22 – 2016-01-24 (×6): 150 mg via ORAL
  Filled 2016-01-21 (×7): qty 1

## 2016-01-21 MED ORDER — ALBUTEROL SULFATE (2.5 MG/3ML) 0.083% IN NEBU
2.5000 mg | INHALATION_SOLUTION | Freq: Once | RESPIRATORY_TRACT | Status: AC
  Administered 2016-01-21: 2.5 mg via RESPIRATORY_TRACT
  Filled 2016-01-21: qty 3

## 2016-01-21 MED ORDER — SODIUM CHLORIDE 0.9 % IJ SOLN
INTRAMUSCULAR | Status: AC
  Filled 2016-01-21: qty 50

## 2016-01-21 MED ORDER — METHYLPREDNISOLONE SODIUM SUCC 125 MG IJ SOLR
60.0000 mg | Freq: Two times a day (BID) | INTRAMUSCULAR | Status: DC
Start: 2016-01-21 — End: 2016-01-23
  Administered 2016-01-21 – 2016-01-23 (×4): 60 mg via INTRAVENOUS
  Filled 2016-01-21 (×4): qty 2

## 2016-01-21 MED ORDER — PIPERACILLIN-TAZOBACTAM 3.375 G IVPB
3.3750 g | Freq: Once | INTRAVENOUS | Status: AC
  Administered 2016-01-21: 3.375 g via INTRAVENOUS
  Filled 2016-01-21: qty 50

## 2016-01-21 NOTE — ED Notes (Signed)
Pt states he only takes a few steps at home with his walker and then sits back down.  States he is too weak to walk at this time.

## 2016-01-21 NOTE — ED Notes (Signed)
X-ray at bedside

## 2016-01-21 NOTE — H&P (Addendum)
Malik Zuniga GYK:[id number] DOB: 08-11-1945 DOA: 01/21/2016     PCP: [name], MD   Outpatient Specialists: [facility]   Patient coming from:   home Lives With family   Chief Complaint: Shortness of breath  HPI: Malik Zuniga is a 70 y.o. 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 Diovan, CVA combine systolic diastolic CHF chronic    Presented with to 3 days of worsening fatigue worsening shortness of breath cough with dark mucus associated wheezing no fevers nausea vomiting no chest pain his family has called to primary care provider is very concerned.. EMS arrived to home on patient to have extensive wheezing is given nebulizer treatment and to Solu-Medrol placed on  CPAP  On arrival to emergency department required 5 L with oxygen saturation only up to 86% Patient has recurrent admissions for aspiration pneumonia is been followed by pulmonology for COPD and bronchiectasis currently using a therapy vest he is on chronic steroids for his COPD/bronchiectasis Regarding pertinent Chronic problems: Patient had recurrent admission for aspiration pneumonia and sepsis during one of his hospitalizations he was found to have a flutter with RVR and was able converted to sinus he was started on pyridoxine and diltiazem and metoprolol IN ER:  Temp (24hrs), Avg:97.4 F (36.3 C), Min:97.4 F (36.3 C), Max:97.4 F (36.3 C)     RR15 92% 5L HR 83 BP 124/80 Following Medications were ordered in ER: Medications  vancomycin (VANCOCIN) IVPB 1000 mg/200 mL premix (1,000 mg Intravenous New Bag/Given 01/21/16 1830)  piperacillin-tazobactam (ZOSYN) IVPB 3.375 g (not administered)  albuterol (PROVENTIL) (2.5 MG/3ML) 0.083% nebulizer solution 2.5 mg  (2.5 mg Nebulization Given 01/21/16 1645)  ipratropium-albuterol (DUONEB) 0.5-2.5 (3) MG/3ML nebulizer solution 3 mL (3 mLs Nebulization Given 01/21/16 1645)   ABG 7.409/48.3/65.4 Lactic acid 1.34 WBC 9.5 hemoglobin 14.1 Na 137 Cr 0.82   CT angiogram showing no evidence of PE visit debridement and trachea M partially up securing bronchi compatible aspiration no focal consolidation to subacute nondisplaced right posterior eighth rib fracture     Hospitalist was called for admission for aspiration pneumonia   Review of Systems:    Pertinent positives include: hortness of breath at rest.dyspnea on exertion,   Constitutional:  No weight loss, night sweats, Fevers, chills, fatigue, weight loss  HEENT:  No headaches, Difficulty swallowing,Tooth/dental problems,Sore throat,  No sneezing, itching, ear ache, nasal congestion, post nasal drip,  Cardio-vascular:  No chest pain, Orthopnea, PND, anasarca, dizziness, palpitations.no Bilateral lower extremity swelling  GI:  No heartburn, indigestion, abdominal pain, nausea, vomiting, diarrhea, change in bowel habits, loss of appetite, melena, blood in stool, hematemesis Resp:    No excess mucus, no productive cough, No non-productive cough, No coughing up of blood.No change in color of mucus.No wheezing. Skin:  no rash or lesions. No jaundice GU:  no dysuria, change in color of urine, no urgency or frequency. No straining to urinate.  No flank pain.  Musculoskeletal:  No joint pain or no joint swelling. No decreased range of motion. No back pain.  Psych:  No change in mood or affect. No depression or anxiety. No memory loss.  Neuro: no localizing neurological complaints, no tingling, no weakness, no double vision, no gait abnormality, no slurred speech, no confusion  As per HPI otherwise  10 point review of systems negative.   Past Medical History: Past Medical History:  Diagnosis Date  . Anemia associated with acute blood loss   .  Angioedema 10/31/2010  . Anxiety    takes Valium daily  . Aspiration pneumonia ([place]) 2010  . Asthma   . Bacteremia due to Pseudomonas 10/04/2011   D/t gram neg rods   . 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 Chronic back pain    compression fracture  . Constipation    takes Colace daily as well as Miralax  . Coronary artery disease   . Depression    takes Cymbalta daily  . Emphysema of lung (HCC)    Albuterol as needed;Symbicort daily and Singulair at bedtime  . GERD (gastroesophageal reflux disease)    takes Omeprazole daily  . Headache, chronic daily   . History of bronchitis   . History of colon polyps   . History of migraine    last migraine a couple of days ago;takes Excedrin Migraine  . History of prostate cancer 2004  . Hyperlipidemia    takes Simvastatin daily  . Hypertension    takes Metoprolol daily as well as Hyzaar  . Insomnia    takes Benadryl nightly  . Lung cancer ([place]) 2016  . Mood change ([place])    after Brain surgery mood changed and was placed on Depakote  . Myocardial infarction 04/1998  . Neuromuscular disorder ([place]) 1998   right carpal tunnel release  . On home O2   . Shortness of breath    with exertion  . Stroke [facility]) 1998   Brain Aneurysm  . Tremor    Past Surgical History:  Procedure Laterality Date  . BACK SURGERY  08/2004; 02/2005; 04/2006; 06/2007; 7/20101   all by Dr. Annette Stable  . BACK SURGERY  05/2013  . BRAIN SURGERY  1999   clip aneurysm  . [place]   right  . CHOLECYSTECTOMY  1996  . COLONOSCOPY    . CORONARY ANGIOPLASTY WITH STENT PLACEMENT  2000   RCA stent, Dr. Rollene Fare; denies stent for PAD 06/15/13  . CORONARY ANGIOPLASTY WITH STENT PLACEMENT  04/1998  . CRANIOTOMY  1999   to clip aneurism, Dr. Annette Stable  . DG BIOPSY LUNG    . ESOPHAGOGASTRODUODENOSCOPY N/A 07/23/2012   Procedure: ESOPHAGOGASTRODUODENOSCOPY (EGD);  Surgeon: Jeryl Columbia, MD;  Location: [facility] ENDOSCOPY;  Service:  Endoscopy;  Laterality: N/A;  buccini /ja  . GASTROSTOMY W/ FEEDING TUBE  2008   Dr. Watt Climes; only had for a few months  . HAND SURGERY  1989   crushed thumb, Dr. Fredna Dow  . PROSTATECTOMY  04/2001   removal of prostate cancer, Dr. Janice Norrie  . SPINE SURGERY    . [place]   left arm, Dr. Fredna Dow     Social History:  Ambulatory  wheelchair bound     reports that he quit smoking about 3 years ago. His smoking use included Cigarettes. He has a 78.00 pack-year smoking history. He has never used smokeless tobacco. He reports that he does not drink alcohol or use drugs.  Allergies:   Allergies  Allergen Reactions  . Lisinopril Swelling    ANGIOEDEMA  . Lamictal [Lamotrigine] Other (See Comments)    Weakness/difficulty swallowing  . Ambien [Zolpidem] Other (See Comments)    Unknown reaction       Family History:   Family History  Problem Relation Age of Onset  . Heart disease Mother   . Cancer Father     brain cancer and prostate cancer     Medications: Prior to Admission medications   Medication Sig Start Date End Date Taking? Authorizing Provider  ARIPiprazole (ABILIFY) 10 MG tablet Take 5 mg by mouth daily.  05/09/14  Yes Historical Provider, MD  Ascorbic Acid (VITAMIN C) 1000 MG tablet Take 1,000 mg by mouth daily.   Yes Historical Provider, MD  atorvastatin (LIPITOR) 20 MG tablet Take 1 tablet (20 mg total) by mouth daily at 6 PM. 09/11/15  Yes Brittainy M Simmons, PA-C  budesonide (PULMICORT) 0.25 MG/2ML nebulizer solution Take 4 mLs (0.5 mg total) by nebulization 2 (two) times daily. 12/07/13  Yes Kathee Delton, MD  cholecalciferol (VITAMIN D) 1000 UNITS tablet Take 1,000 Units by mouth every morning.    Yes Historical Provider, MD  dabigatran (PRADAXA) 150 MG CAPS capsule Take 1 capsule (150 mg total) by mouth every 12 (twelve) hours. 11/01/15  Yes Belva Crome, MD  diazepam (VALIUM) 5 MG tablet Take 1 tablet (5 mg total) by mouth every 8 (eight) hours as needed  for anxiety or muscle spasms. scheduled 06/29/15  Yes Donita Brooks, NP  divalproex (DEPAKOTE) 500 MG 24 hr tablet Take 1,000 mg by mouth at bedtime.    Yes Historical Provider, MD  docusate sodium (COLACE) 100 MG capsule Take 200 mg by mouth at bedtime.    Yes Historical Provider, MD  DULoxetine (CYMBALTA) 60 MG capsule Take 120 mg by mouth daily.    Yes Historical Provider, MD  fluticasone (FLONASE) 50 MCG/ACT nasal spray PLACE 2 SPRAYS INTO BOTH NOSTRILS DAILY. Patient taking differently: PLACE 2 SPRAYS INTO BOTH NOSTRILS DAILY as needed for allergies 09/17/15  Yes [name], MD  HYDROcodone-acetaminophen (NORCO) 10-325 MG tablet Take 1-2 tablets by mouth every 4 (four) hours as needed for pain. 01/07/16  Yes Historical Provider, MD  ipratropium-albuterol (DUONEB) 0.5-2.5 (3) MG/3ML SOLN Take 3 mLs by nebulization every 6 (six) hours. 12/07/13  Yes Kathee Delton, MD  metoprolol (LOPRESSOR) 50 MG tablet Take 1 tablet (50 mg total) by mouth 2 (two) times daily. 09/20/15  Yes Belva Crome, MD  montelukast (SINGULAIR) 10 MG tablet TAKE 1 TABLET AT BEDTIME 10/31/15  Yes [name], MD  nitroGLYCERIN (NITROSTAT) 0.4 MG SL tablet PLACE 1 TABLET (0.4 MG TOTAL) UNDER THE TONGUE EVERY 5 (FIVE) MINUTES AS NEEDED FOR CHEST PAIN. 08/06/15  Yes Belva Crome, MD  omeprazole (PRILOSEC) 40 MG capsule Take 40 mg by mouth 2 (two) times daily.    Yes Historical Provider, MD  predniSONE (DELTASONE) 10 MG tablet 4 tabs x 2 days, then 3 tabs x2 days, 2 tabs x2 days, 1 tab x2 days and then 5 mg daily Patient taking differently: Take 10 mg by mouth every morning.  01/09/16  Yes [name], MD  primidone (MYSOLINE) 250 MG tablet TAKE 1 TABLET TWICE DAILY Patient taking differently: TAKE '250mg'$  TABLET by mouth TWICE DAILY 10/31/15  Yes [name], MD  Zinc 50 MG TABS Take 50 mg by mouth daily.    Yes Historical Provider, MD  diltiazem (CARDIZEM CD) 240 MG 24 hr capsule TAKE 1 CAPSULE EVERY DAY  01/21/16   Belva Crome, MD  feeding supplement, ENSURE ENLIVE, (ENSURE ENLIVE) LIQD Take 237 mLs by mouth 2 (two) times daily between meals. 08/09/15   Modena Jansky, MD  guaiFENesin (  ROBITUSSIN) 100 MG/5ML SOLN Take 5 mLs (100 mg total) by mouth every 4 (four) hours as needed for cough or to loosen phlegm. Patient not taking: Reported on 01/21/2016 08/09/15   Modena Jansky, MD  ranitidine (ZANTAC) 150 MG tablet Take 150-300 mg by mouth 2 (two) times daily as needed for heartburn.    Historical Provider, MD  tiZANidine (ZANAFLEX) 4 MG tablet TAKE 1 TABLET BY MOUTH EVERY 8 HOURS AS NEEDED FOR MUSCLE SPASMS. Patient not taking: Reported on 01/21/2016 01/09/15   [name], MD    Physical Exam: Patient Vitals for the past 24 hrs:  BP Temp Temp src Pulse Resp SpO2 Height Weight  01/21/16 1830 135/74 - - 86 18 94 % - -  01/21/16 1826 130/61 - - 93 18 94 % - -  01/21/16 1815 - - - 85 17 93 % - -  01/21/16 1800 130/61 - - 82 16 94 % - -  01/21/16 1730 131/75 - - 89 17 92 % - -  01/21/16 1550 - - - - - 94 % '5\' 9"'$  (1.753 m) 74.8 kg (165 lb)  01/21/16 1542 - - - - - 92 % - -  01/21/16 1539 134/85 97.4 F (36.3 C) Oral 88 15 90 % - -    1. General:  in No Acute distress 2. Psychological: Alert and  Oriented 3. Head/ENT:     Dry Mucous Membranes                          Head Non traumatic, neck supple                           Poor Dentition 4. SKIN:   decreased Skin turgor,  Skin clean Dry and intact no rash 5. Heart: Regular rate and rhythm no  Murmur, Rub or gallop 6. Lungs:some  wheezes extensive crackles  Coarse breath sounds 7. Abdomen: Soft, non-tender, Non distended 8. Lower extremities: no clubbing, cyanosis, or edema 9. Neurologically Grossly intact, moving all 4 extremities equally   10. MSK: Normal range of motion   body mass index is 24.37 kg/m.  Labs on Admission:   Labs on Admission: I have personally reviewed following labs and imaging  studies  CBC:  Recent Labs Lab 01/21/16 1601  WBC 9.5  NEUTROABS 7.3  HGB 14.1  HCT 42.9  MCV 89.7  PLT 366   Basic Metabolic Panel:  Recent Labs Lab 01/21/16 1601  NA 137  K 4.3  CL 98*  CO2 30  GLUCOSE 103*  BUN 14  CREATININE 0.82  CALCIUM 9.4   GFR: Estimated Creatinine Clearance: 83.8 mL/min (by C-G formula based on SCr of 0.82 mg/dL). Liver Function Tests:  Recent Labs Lab 01/21/16 1601  AST 25  ALT 21  ALKPHOS 46  BILITOT 0.7  PROT 7.0  ALBUMIN 4.1   No results for input(s): LIPASE, AMYLASE in the last 168 hours. No results for input(s): AMMONIA in the last 168 hours. Coagulation Profile: No results for input(s): INR, PROTIME in the last 168 hours. Cardiac Enzymes: No results for input(s): CKTOTAL, CKMB, CKMBINDEX, TROPONINI in the last 168 hours. BNP (last 3 results) No results for input(s): PROBNP in the last 8760 hours. HbA1C: No results for input(s): HGBA1C in the last 72 hours. CBG: No results for input(s): GLUCAP in the last 168 hours. Lipid Profile: No results for input(s): CHOL, HDL, LDLCALC,  TRIG, CHOLHDL, LDLDIRECT in the last 72 hours. Thyroid Function Tests: No results for input(s): TSH, T4TOTAL, FREET4, T3FREE, THYROIDAB in the last 72 hours. Anemia Panel: No results for input(s): VITAMINB12, FOLATE, FERRITIN, TIBC, IRON, RETICCTPCT in the last 72 hours.  Sepsis Labs: '@LABRCNTIP'$ (procalcitonin:4,lacticidven:4) )No results found for this or any previous visit (from the past 240 hour(s)).     UA not ordered  Lab Results  Component Value Date   HGBA1C 5.6 04/01/2015    Estimated Creatinine Clearance: 83.8 mL/min (by C-G formula based on SCr of 0.82 mg/dL).  BNP (last 3 results) No results for input(s): PROBNP in the last 8760 hours.   ECG REPORT  Independently reviewed Rate: 85  Rhythm: NSR ST&T Change: No acute ischemic changes   QTC 436  Filed Weights   01/21/16 1550  Weight: 74.8 kg (165 lb)     Cultures:     Component Value Date/Time   SDES URINE, CLEAN CATCH 12/18/2015 2240   SPECREQUEST NONE 12/18/2015 2240   CULT NO GROWTH Performed at [facility]  12/18/2015 2240   REPTSTATUS 12/20/2015 FINAL 12/18/2015 2240     Radiological Exams on Admission: Dg Chest Port 1 View  Result Date: 01/21/2016 CLINICAL DATA:  Shortness of breath and wheezing. EXAM: PORTABLE CHEST 1 VIEW COMPARISON:  None. FINDINGS: The heart size and mediastinal contours are within normal limits. Both lungs are clear. Previous thoracolumbar spinal fusion. IMPRESSION: No active disease. Electronically Signed   By: Lorriane Shire M.D.   On: 01/21/2016 16:20    Chart has been reviewed    Assessment/Plan   70 y.o. 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 Diovan, CVA combine systolic diastolic CHF chronic in admitted for aspiration pneumonia   Present on Admission: . Acute exacerbation of bronchiectasis ([place]) -given wheezing we will treat with steroids, flutter valve, DuoNeb treat for aspiration pneumonia and cover with spectrum antibiotics . Bipolar disorder ([place]) stable continue Abilify and Depakote . CAD (coronary artery disease) is stable continue home medications . Cancer of lower lobe of right lung ([place]) stage I diagnosed in 2016 need to follow-up as an outpatient with oncology/pulmonology . Chronic lower back pain-  continue home medications . Acute on Chronic respiratory failure with hypoxia (HCC) likely secondary to new episode of aspiration unfortunate overall discomfort IV recurrent pattern appreciate pulmonology consult regarding discussion of overall goals of care . Essential hypertension stable continue home medications   .Chronic Atrial  fibrillation (HCC)        - CHA2DS2 vas score 5 : continue current anticoagulation with Pradaxa          -  Rate control:  Currently controlled with  Metoprolol and Diltiazem will continue        - Rhythm control: none  . Aspiration pneumonia ([place]) treat with  broad-spectrum antibiotics, secondary to chronic aspiration discussed with patient overall poor prognosis and likelihood of recurrent admissions and recurrent pneumonia patient states he stands he does not wish to have a G-tube. Patient already on pured diet. Examination states he distends the risk but would like to continue been able to eat   Other plan as per orders.  DVT prophylaxis: Pradaxa  Code Status:  FULL CODE    as per patient    Family Communication:  Family not  at  Bedside    Disposition Plan:     To home once workup is complete and patient is stable                                                    Consults called: pulmonology   Admission status:     inpatient       Level of care     SDU      I have spent a total of 19mn on this admission  extra time was spent to discuss case pulmonology will see in consult in AM  [facility]12/01/2016, 12:56 AM    [facility]  Pager [phone]  after 2 AM please page floor coverage PA If 7AM-7PM, please contact the day team taking care of the patient  [email or web address]  Password [email or web address]

## 2016-01-21 NOTE — ED Provider Notes (Signed)
[facility] DEPT Provider Note   CSN: [id number] Arrival date & time: 01/21/16  1529     History   Chief Complaint No chief complaint on file.   HPI Malik Zuniga is a 70 y.o. male.  Patient complains of shortness breath and wheezing for 3 days    Shortness of Breath  This is a new problem. The problem occurs continuously.The current episode started 2 days ago. The problem has been gradually worsening. Pertinent negatives include no fever, no headaches, no cough, no chest pain, no abdominal pain and no rash. It is unknown what precipitated the problem.    Past Medical History:  Diagnosis Date  . Anemia associated with acute blood loss   . Angioedema 10/31/2010  . Anxiety    takes Valium daily  . Aspiration pneumonia ([place]) 2010  . Asthma   . Bacteremia due to Pseudomonas 10/04/2011   D/t gram neg rods   . 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 Chronic back pain    compression fracture  . Constipation    takes Colace daily as well as Miralax  . Coronary artery disease   . Depression    takes Cymbalta daily  . Emphysema of lung (HCC)    Albuterol as needed;Symbicort daily and Singulair at bedtime  . GERD (gastroesophageal reflux disease)    takes Omeprazole daily  . Headache, chronic daily   . History of bronchitis   . History of colon polyps   . History of migraine    last migraine a couple of days ago;takes Excedrin Migraine  . History of prostate cancer 2004  . Hyperlipidemia    takes Simvastatin daily  . Hypertension    takes Metoprolol daily as well as Hyzaar  . Insomnia    takes Benadryl nightly  . Lung cancer ([place]) 2016  . Mood change ([place])    after Brain surgery mood changed and was placed on Depakote  . Myocardial infarction 04/1998  . Neuromuscular disorder ([place]) 1998   right carpal tunnel release  . On home O2   . Shortness of breath    with exertion  . 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 Tremor      Patient Active Problem List   Diagnosis Date Noted  . Acute exacerbation of bronchiectasis ([place]) 12/18/2015  . Chronic anticoagulation 11/01/2015  . Moderate malnutrition ([place]) 08/07/2015  . Bronchiectasis ([place]) 08/07/2015  . Cerebrovascular disease, arteriosclerotic, post-stroke 08/07/2015  . Acute delirium 08/07/2015  . Chronic bilateral low back pain with bilateral sciatica   . Adjustment disorder with mixed anxiety and depressed mood   . Paroxysmal atrial fibrillation (HCC)   . Diplopia   . Tachypnea   . Hypokalemia   . Absolute anemia   . COPD (chronic obstructive pulmonary disease) ([place])   . Bronchiectasis without acute exacerbation ([place]) 08/02/2015  . Atrial flutter with rapid ventricular response ([place]) 08/02/2015  . Aspiration pneumonia ([place]) 06/25/2015  . Tinea cruris 06/05/2015  . Sepsis ([place]) 04/01/2015  . Cancer of lower lobe of right lung ([place]) 04/01/2015  . Chronic respiratory failure with hypoxia ([place]) 04/01/2015  . Cough 03/08/2015  . Dysphagia 02/01/2015  . Primary cancer of right lower lobe of lung ([place]) 12/05/2014  . Recurrent aspiration pneumonia ([place]) 11/16/2013  . Toenail fungus 11/06/2013  . 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 CAD (coronary artery disease) 07/27/2013  . Thoracic compression fracture ([place]) 06/21/2013  .  Thoracic spine fracture ([place]) 06/21/2013  . Giant comedone 11/04/2012  . Unspecified constipation 07/24/2012  . Hyponatremia 07/22/2012  . Chronic lower back pain 07/22/2012  . Transaminitis 07/22/2012  . Prostate cancer ([place]) 04/22/2012  . Anemia associated with acute blood loss   . 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 Falls frequently 07/31/2011  . COPD   01/14/2011  . Allergic rhinitis, seasonal 12/18/2010  . Neoplasm of uncertain behavior of skin 05/07/2010  . MYOCARDIAL INFARCTION 02/20/2010  . Hyperlipidemia 02/13/2010  . Bipolar disorder ([place]) 02/13/2010  . DEPRESSION 02/13/2010  . Essential hypertension 02/13/2010  . TREMOR  02/13/2010  . PROSTATE CANCER, HX OF 02/13/2010  . Hx of TIA (transient ischemic attack) and stroke 02/13/2010  . Chronic daily headache 02/13/2010    Past Surgical History:  Procedure Laterality Date  . BACK SURGERY  08/2004; 02/2005; 04/2006; 06/2007; 7/20101   all by Dr. Annette Stable  . BACK SURGERY  05/2013  . BRAIN SURGERY  1999   clip aneurysm  . [place]   right  . CHOLECYSTECTOMY  1996  . COLONOSCOPY    . CORONARY ANGIOPLASTY WITH STENT PLACEMENT  2000   RCA stent, Dr. Rollene Fare; denies stent for PAD 06/15/13  . CORONARY ANGIOPLASTY WITH STENT PLACEMENT  04/1998  . CRANIOTOMY  1999   to clip aneurism, Dr. Annette Stable  . DG BIOPSY LUNG    . ESOPHAGOGASTRODUODENOSCOPY N/A 07/23/2012   Procedure: ESOPHAGOGASTRODUODENOSCOPY (EGD);  Surgeon: Jeryl Columbia, MD;  Location: [facility] ENDOSCOPY;  Service: Endoscopy;  Laterality: N/A;  buccini /ja  . GASTROSTOMY W/ FEEDING TUBE  2008   Dr. Watt Climes; only had for a few months  . HAND SURGERY  1989   crushed thumb, Dr. Fredna Dow  . PROSTATECTOMY  04/2001   removal of prostate cancer, Dr. Janice Norrie  . SPINE SURGERY    . [place]   left arm, Dr. Fredna Dow       Home Medications    Prior to Admission medications   Medication Sig Start Date End Date Taking? Authorizing Provider  ARIPiprazole (ABILIFY) 10 MG tablet Take 5 mg by mouth daily.  05/09/14  Yes Historical Provider, MD  Ascorbic Acid (VITAMIN C) 1000 MG tablet Take 1,000 mg by mouth daily.   Yes Historical Provider, MD  atorvastatin (LIPITOR) 20 MG tablet Take 1 tablet (20 mg total) by mouth daily at 6 PM. 09/11/15  Yes Brittainy M Simmons, PA-C  budesonide (PULMICORT) 0.25 MG/2ML nebulizer solution Take 4 mLs (0.5 mg total) by nebulization 2 (two) times daily. 12/07/13  Yes Kathee Delton, MD  cholecalciferol (VITAMIN D) 1000 UNITS tablet Take 1,000 Units by mouth every morning.    Yes Historical Provider, MD  dabigatran (PRADAXA) 150 MG CAPS capsule Take 1 capsule (150 mg total) by  mouth every 12 (twelve) hours. 11/01/15  Yes Belva Crome, MD  diazepam (VALIUM) 5 MG tablet Take 1 tablet (5 mg total) by mouth every 8 (eight) hours as needed for anxiety or muscle spasms. scheduled 06/29/15  Yes Donita Brooks, NP  divalproex (DEPAKOTE) 500 MG 24 hr tablet Take 1,000 mg by mouth at bedtime.    Yes Historical Provider, MD  docusate sodium (COLACE) 100 MG capsule Take 200 mg by mouth at bedtime.    Yes Historical Provider, MD  DULoxetine (CYMBALTA) 60 MG capsule Take 120 mg by mouth daily.    Yes Historical Provider, MD  fluticasone (FLONASE) 50 MCG/ACT nasal spray PLACE 2 SPRAYS INTO BOTH  NOSTRILS DAILY. Patient taking differently: PLACE 2 SPRAYS INTO BOTH NOSTRILS DAILY as needed for allergies 09/17/15  Yes [name], MD  HYDROcodone-acetaminophen (NORCO) 10-325 MG tablet Take 1-2 tablets by mouth every 4 (four) hours as needed for pain. 01/07/16  Yes Historical Provider, MD  ipratropium-albuterol (DUONEB) 0.5-2.5 (3) MG/3ML SOLN Take 3 mLs by nebulization every 6 (six) hours. 12/07/13  Yes Kathee Delton, MD  metoprolol (LOPRESSOR) 50 MG tablet Take 1 tablet (50 mg total) by mouth 2 (two) times daily. 09/20/15  Yes Belva Crome, MD  montelukast (SINGULAIR) 10 MG tablet TAKE 1 TABLET AT BEDTIME 10/31/15  Yes [name], MD  nitroGLYCERIN (NITROSTAT) 0.4 MG SL tablet PLACE 1 TABLET (0.4 MG TOTAL) UNDER THE TONGUE EVERY 5 (FIVE) MINUTES AS NEEDED FOR CHEST PAIN. 08/06/15  Yes Belva Crome, MD  omeprazole (PRILOSEC) 40 MG capsule Take 40 mg by mouth 2 (two) times daily.    Yes Historical Provider, MD  predniSONE (DELTASONE) 10 MG tablet 4 tabs x 2 days, then 3 tabs x2 days, 2 tabs x2 days, 1 tab x2 days and then 5 mg daily Patient taking differently: Take 10 mg by mouth every morning.  01/09/16  Yes [name], MD  primidone (MYSOLINE) 250 MG tablet TAKE 1 TABLET TWICE DAILY Patient taking differently: TAKE '250mg'$  TABLET by mouth TWICE DAILY 10/31/15  Yes [name], MD  Zinc 50 MG TABS Take 50 mg by mouth daily.    Yes Historical Provider, MD  diltiazem (CARDIZEM CD) 240 MG 24 hr capsule TAKE 1 CAPSULE EVERY DAY 01/21/16   Belva Crome, MD  feeding supplement, ENSURE ENLIVE, (ENSURE ENLIVE) LIQD Take 237 mLs by mouth 2 (two) times daily between meals. 08/09/15   Modena Jansky, MD  guaiFENesin (ROBITUSSIN) 100 MG/5ML SOLN Take 5 mLs (100 mg total) by mouth every 4 (four) hours as needed for cough or to loosen phlegm. Patient not taking: Reported on 01/21/2016 08/09/15   Modena Jansky, MD  ranitidine (ZANTAC) 150 MG tablet Take 150-300 mg by mouth 2 (two) times daily as needed for heartburn.    Historical Provider, MD  tiZANidine (ZANAFLEX) 4 MG tablet TAKE 1 TABLET BY MOUTH EVERY 8 HOURS AS NEEDED FOR MUSCLE SPASMS. Patient not taking: Reported on 01/21/2016 01/09/15   [name], MD    Family History Family History  Problem Relation Age of Onset  . Heart disease Mother   . Cancer Father     brain cancer and prostate cancer     Social History Social History  Substance Use Topics  . Smoking status: Former Smoker    Packs/day: 1.50    Years: 52.00    Types: Cigarettes    Quit date: 07/17/2012  . Smokeless tobacco: Never Used     Comment: Former smoker  . Alcohol use No     Allergies   Lisinopril; Lamictal [lamotrigine]; and Ambien [zolpidem]   Review of Systems Review of Systems  Constitutional: Negative for appetite change, fatigue and fever.  HENT: Negative for congestion, ear discharge and sinus pressure.   Eyes: Negative for discharge.  Respiratory: Positive for shortness of breath. Negative for cough.   Cardiovascular: Negative for chest pain.  Gastrointestinal: Negative for abdominal pain and diarrhea.  Genitourinary: Negative for frequency and hematuria.  Musculoskeletal: Negative for back pain.  Skin: Negative for rash.  Neurological: Negative for seizures and headaches.  Psychiatric/Behavioral: Negative  for hallucinations.  Physical Exam Updated Vital Signs BP 135/74   Pulse 86   Temp 97.4 F (36.3 C) (Oral)   Resp 18   Ht '5\' 9"'$  (1.753 m)   Wt 165 lb (74.8 kg)   SpO2 94%   BMI 24.37 kg/m   Physical Exam  Constitutional: He is oriented to person, place, and time. He appears well-developed.  HENT:  Head: Normocephalic.  Eyes: Conjunctivae and EOM are normal. No scleral icterus.  Neck: Neck supple. No thyromegaly present.  Cardiovascular: Normal rate and regular rhythm.  Exam reveals no gallop and no friction rub.   No murmur heard. Pulmonary/Chest: No stridor. He has wheezes. He has no rales. He exhibits no tenderness.  Abdominal: He exhibits no distension. There is no tenderness. There is no rebound.  Musculoskeletal: Normal range of motion. He exhibits no edema.  Lymphadenopathy:    He has no cervical adenopathy.  Neurological: He is oriented to person, place, and time. He exhibits normal muscle tone. Coordination normal.  Skin: No rash noted. No erythema.  Psychiatric: He has a normal mood and affect. His behavior is normal.     ED Treatments / Results  Labs (all labs ordered are listed, but only abnormal results are displayed) Labs Reviewed  CBC WITH DIFFERENTIAL/PLATELET - Abnormal; Notable for the following:       Result Value   RDW 16.6 (*)    All other components within normal limits  COMPREHENSIVE METABOLIC PANEL - Abnormal; Notable for the following:    Chloride 98 (*)    Glucose, Bld 103 (*)    All other components within normal limits  BLOOD GAS, ARTERIAL - Abnormal; Notable for the following:    pCO2 arterial 48.3 (*)    pO2, Arterial 65.4 (*)    Bicarbonate 29.9 (*)    Acid-Base Excess 4.7 (*)    All other components within normal limits  BRAIN NATRIURETIC PEPTIDE  I-STAT CG4 LACTIC ACID, ED  Randolm Idol, ED    EKG  EKG Interpretation None       Radiology Dg Chest Port 1 View  Result Date: 01/21/2016 CLINICAL DATA:  Shortness  of breath and wheezing. EXAM: PORTABLE CHEST 1 VIEW COMPARISON:  None. FINDINGS: The heart size and mediastinal contours are within normal limits. Both lungs are clear. Previous thoracolumbar spinal fusion. IMPRESSION: No active disease. Electronically Signed   By: Lorriane Shire M.D.   On: 01/21/2016 16:20    Procedures Procedures (including critical care time)  Medications Ordered in ED Medications  vancomycin (VANCOCIN) IVPB 1000 mg/200 mL premix (1,000 mg Intravenous New Bag/Given 01/21/16 1830)  piperacillin-tazobactam (ZOSYN) IVPB 3.375 g (not administered)  albuterol (PROVENTIL) (2.5 MG/3ML) 0.083% nebulizer solution 2.5 mg (2.5 mg Nebulization Given 01/21/16 1645)  ipratropium-albuterol (DUONEB) 0.5-2.5 (3) MG/3ML nebulizer solution 3 mL (3 mLs Nebulization Given 01/21/16 1645)     Initial Impression / Assessment and Plan / ED Course  I have reviewed the triage vital signs and the nursing notes.  Pertinent labs & imaging results that were available during my care of the patient were reviewed by me and considered in my medical decision making (see chart for details).  Clinical Course     Patient with exacerbation of COPD and hypoxia. Patient will get a CT angios chest and will be admitted by medicine  Final Clinical Impressions(s) / ED Diagnoses   Final diagnoses:  None    New Prescriptions New Prescriptions   No medications on file     Milton Ferguson,  MD 01/21/16 1845

## 2016-01-21 NOTE — Telephone Encounter (Signed)
Sounds like he needs to go to the ER by EMS

## 2016-01-21 NOTE — ED Notes (Signed)
Pt placed on 5 L with saturation of 86%.  Reported to MD.  MD requests respiratory place pt back on CPAP.  Respiratory notified

## 2016-01-21 NOTE — Telephone Encounter (Signed)
BQ  Please Advise- Sick Message  Son called concerned that over the past couple of days his dad has experience body weakness, increase sob, coughing with dark mucus, wheezing, Denies fever,nauea,vomitting,chest tightness. Son is concerned that he might have pneumonia again, and wants to know should he take him to the hospital. He also wanted to state that he would have to get him transported by EMS because he would not be able to drive him.

## 2016-01-21 NOTE — ED Notes (Signed)
Bed: VF47 Expected date:  Expected time:  Means of arrival:  Comments: EMS- SOB/on CPAP

## 2016-01-21 NOTE — ED Notes (Signed)
Patient transported to CT 

## 2016-01-21 NOTE — Telephone Encounter (Signed)
Called spoke with pt's son Ronalee Belts and discussed BQ's recommendations with him Ronalee Belts voiced his understanding, had some general questions about calling for EMS  Nothing further needed, will sign off

## 2016-01-21 NOTE — ED Notes (Signed)
Pt cant walk

## 2016-01-21 NOTE — ED Triage Notes (Addendum)
Pt comes from home via EMS with complaints of SOB.  Hx of COPD.  On humidified RA, daily nebulizer treatments, hx of risk for pneumonia due to aspiration.  Rales and wheezing heard by EMS.  Nebulizer x2, solumedrol, and CPAP placed in route. CPAP off on arrival.  Symptoms started 2 days ago.

## 2016-01-22 ENCOUNTER — Other Ambulatory Visit: Payer: Self-pay | Admitting: *Deleted

## 2016-01-22 DIAGNOSIS — J9611 Chronic respiratory failure with hypoxia: Secondary | ICD-10-CM

## 2016-01-22 DIAGNOSIS — J471 Bronchiectasis with (acute) exacerbation: Secondary | ICD-10-CM

## 2016-01-22 DIAGNOSIS — J189 Pneumonia, unspecified organism: Secondary | ICD-10-CM | POA: Diagnosis present

## 2016-01-22 DIAGNOSIS — J9621 Acute and chronic respiratory failure with hypoxia: Secondary | ICD-10-CM

## 2016-01-22 LAB — COMPREHENSIVE METABOLIC PANEL
ALT: 19 U/L (ref 17–63)
AST: 22 U/L (ref 15–41)
Albumin: 3.6 g/dL (ref 3.5–5.0)
Alkaline Phosphatase: 41 U/L (ref 38–126)
Anion gap: 9 (ref 5–15)
BUN: 16 mg/dL (ref 6–20)
CHLORIDE: 99 mmol/L — AB (ref 101–111)
CO2: 27 mmol/L (ref 22–32)
CREATININE: 0.69 mg/dL (ref 0.61–1.24)
Calcium: 8.9 mg/dL (ref 8.9–10.3)
GFR calc non Af Amer: >60 mL/min (ref >60)
Glucose, Bld: 135 mg/dL — ABNORMAL HIGH (ref 65–99)
Potassium: 3.9 mmol/L (ref 3.5–5.1)
SODIUM: 135 mmol/L (ref 135–145)
Total Bilirubin: 0.7 mg/dL (ref 0.3–1.2)
Total Protein: 6.6 g/dL (ref 6.5–8.1)

## 2016-01-22 LAB — CBC WITH DIFFERENTIAL/PLATELET
Basophils Absolute: 0 10*3/uL (ref 0.0–0.1)
Basophils Relative: 0 %
EOS ABS: 0 10*3/uL (ref 0.0–0.7)
Eosinophils Relative: 0 %
HEMATOCRIT: 40.5 % (ref 39.0–52.0)
HEMOGLOBIN: 13.6 g/dL (ref 13.0–17.0)
LYMPHS ABS: 0.9 10*3/uL (ref 0.7–4.0)
Lymphocytes Relative: 8 %
MCH: 29.6 pg (ref 26.0–34.0)
MCHC: 33.6 g/dL (ref 30.0–36.0)
MCV: 88.2 fL (ref 78.0–100.0)
Monocytes Absolute: 0.2 10*3/uL (ref 0.1–1.0)
Monocytes Relative: 2 %
NEUTROS PCT: 90 %
Neutro Abs: 10.9 10*3/uL — ABNORMAL HIGH (ref 1.7–7.7)
Platelets: 238 10*3/uL (ref 150–400)
RBC: 4.59 MIL/uL (ref 4.22–5.81)
RDW: 16.2 % — ABNORMAL HIGH (ref 11.5–15.5)
WBC: 12 10*3/uL — ABNORMAL HIGH (ref 4.0–10.5)

## 2016-01-22 LAB — STREP PNEUMONIAE URINARY ANTIGEN: STREP PNEUMO URINARY ANTIGEN: NEGATIVE

## 2016-01-22 MED ORDER — SENNOSIDES-DOCUSATE SODIUM 8.6-50 MG PO TABS
1.0000 | ORAL_TABLET | Freq: Two times a day (BID) | ORAL | Status: DC
  Administered 2016-01-22 – 2016-01-24 (×5): 1 via ORAL
  Filled 2016-01-22 (×5): qty 1

## 2016-01-22 MED ORDER — PIPERACILLIN-TAZOBACTAM 3.375 G IVPB
3.3750 g | Freq: Three times a day (TID) | INTRAVENOUS | Status: DC
  Administered 2016-01-22 – 2016-01-23 (×4): 3.375 g via INTRAVENOUS
  Filled 2016-01-22 (×4): qty 50

## 2016-01-22 MED ORDER — HYDROCODONE-ACETAMINOPHEN 10-325 MG PO TABS
1.0000 | ORAL_TABLET | Freq: Four times a day (QID) | ORAL | Status: DC | PRN
  Administered 2016-01-22 – 2016-01-24 (×5): 1 via ORAL
  Filled 2016-01-22 (×6): qty 1

## 2016-01-22 MED ORDER — VANCOMYCIN HCL IN DEXTROSE 1-5 GM/200ML-% IV SOLN
1000.0000 mg | Freq: Two times a day (BID) | INTRAVENOUS | Status: DC
  Administered 2016-01-22 – 2016-01-23 (×3): 1000 mg via INTRAVENOUS
  Filled 2016-01-22 (×3): qty 200

## 2016-01-22 MED ORDER — IPRATROPIUM-ALBUTEROL 0.5-2.5 (3) MG/3ML IN SOLN
3.0000 mL | Freq: Four times a day (QID) | RESPIRATORY_TRACT | Status: DC
  Administered 2016-01-22 – 2016-01-24 (×7): 3 mL via RESPIRATORY_TRACT
  Filled 2016-01-22 (×9): qty 3

## 2016-01-22 NOTE — Patient Outreach (Signed)
[facility]) Care Management  01/22/2016  Malik Zuniga December 23, 1945 [id number]   Noted that member is currently in the ED, awaiting admission per notes.  Hospital liaison notified.  Will cancel home visit scheduled for tomorrow and follow up once member discharged.  Valente Tobechukwu, [place], MSN [place] [phone]

## 2016-01-22 NOTE — Progress Notes (Signed)
Pharmacy Antibiotic Note  Malik Zuniga is a 70 y.o. male admitted on 01/21/2016 with recurrent Asp. pneumonia.  Pharmacy has been consulted for Vancomycin and Zosyn dosing.  Plan: Vancomycin 1gm IV every 12 hours.  Goal trough 15-20 mcg/mL. Zosyn 3.375g IV q8h (4 hour infusion).  Height: '5\' 9"'$  (175.3 cm) Weight: 165 lb (74.8 kg) IBW/kg (Calculated) : 70.7  Temp (24hrs), Avg:97.4 F (36.3 C), Min:97.4 F (36.3 C), Max:97.4 F (36.3 C)   Recent Labs Lab 01/21/16 1601 01/21/16 1636  WBC 9.5  --   CREATININE 0.82  --   LATICACIDVEN  --  1.34    Estimated Creatinine Clearance: 83.8 mL/min (by C-G formula based on SCr of 0.82 mg/dL).    Allergies  Allergen Reactions  . Lisinopril Swelling    ANGIOEDEMA  . Lamictal [Lamotrigine] Other (See Comments)    Weakness/difficulty swallowing  . Ambien [Zolpidem] Other (See Comments)    Unknown reaction    Antimicrobials this admission: Vancomycin 01/21/2016 >> Zosyn 01/21/2016 >>   Dose adjustments this admission: -  Microbiology results: pending  Thank you for allowing pharmacy to be a part of this patient's care.  Nani Skillern [place] 01/22/2016 1:12 AM

## 2016-01-22 NOTE — ED Notes (Signed)
Attempted to give report.  Nurse taking report is in a meeting.

## 2016-01-22 NOTE — Progress Notes (Signed)
RN received report from ED, Pt arrived unit, alert and oriented, will continue with current plan of care.

## 2016-01-22 NOTE — ED Notes (Signed)
Respiratory notified of neb treatment due.

## 2016-01-22 NOTE — Progress Notes (Signed)
PT Cancellation Note  Patient Details Name: Malik Zuniga MRN: [id number] DOB: 10-Dec-1945   Cancelled Treatment:    Reason Eval/Treat Not Completed: Attempted PT eval in ED (pt waiting on bed on floor). Spoke with pt who politely declined PT eval at this time. He was agreeable to PT checking back on tomorrow.    Weston Anna, MPT Pager: [phone]

## 2016-01-22 NOTE — ED Notes (Signed)
Pt VS reassessed. Pt oxygen saturation observed at 88%. Pt verbalizes "took off oxygen because I do not wear it at home." Pt encouraged to wear nasal cannula related to current oxygen saturation. Pt verbalizes "not going to wear it during the day only at night." Will continue to monitor oxygen saturation.

## 2016-01-22 NOTE — Care Management Note (Signed)
Case Management Note  Patient Details  Name: Malik Zuniga MRN: [id number] Date of Birth: 15-Jan-1946  Subjective/Objective:      Pt on ED room phone when Cm arrived to speak with him confirms he uses a walker at home, wife if primary caregiver but also has support of son and daughter in law at home Pt confirms wife is at [facility] but "will be discharging within the hour to go home" Pt confirms wife will go to his and her home with support of son and daughter in law Pt confirms he and wife are both followed by [facility] and he appreciates the services from [facility]              Action/Plan: Assessed for support/primary care services and [facility] services at home   Expected Discharge Date:   (unknown) pending               Expected Discharge Plan:  [place]  In-House Referral:  NA  Discharge planning Services  CM Consult  Post Acute Care Choice:    Choice offered to:  Patient  DME Arranged:    DME Agency:     HH Arranged:    [place] Agency:     Status of Service:  Completed, signed off for ED assessment  If discussed at Long Length of Stay Meetings, dates discussed:    Additional Comments:  Robbie Lis, RN 01/22/2016, 12:38 PM

## 2016-01-22 NOTE — Progress Notes (Signed)
PROGRESS NOTE    Malik Zuniga  ZJQ:[id number]  DOB: [birth year]  DOA: 01/21/2016 PCP: [name], MD Outpatient Specialists:   Hospital course: Malik Zuniga is a 70 y.o. 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 Diovan, CVA combine systolic diastolic CHF chronic  Presented with to 3 days of worsening fatigue worsening shortness of breath cough with dark mucus associated wheezing no fevers nausea vomiting no chest pain his family has called to primary care provider is very concerned.. EMS arrived to home on patient to have extensive wheezing is given nebulizer treatment and to Solu-Medrol placed on  CPAP  On arrival to emergency department required 5 L with oxygen saturation only up to 86% Patient has recurrent admissions for aspiration pneumonia is been followed by pulmonology for COPD and bronchiectasis currently using a therapy vest he is on chronic steroids for his COPD/bronchiectasis. PCCM Consulted.   Assessment & Plan:   Marland Kitchen Acute exacerbation of bronchiectasis  - Appreciate PCCM consult and recommendations, given wheezing we will treat with steroids, flutter valve, DuoNeb treat for aspiration pneumonia and cover with spectrum antibiotics  . Bipolar disorder ([place]) stable continue Abilify and Depakote  . CAD (coronary artery disease) is stable continue home medications  . Cancer of lower lobe of right lung stage I diagnosed in 2016 need to follow-up as an outpatient with oncology/pulmonology  . Chronic lower back pain-  continue home medications  . Acute on Chronic respiratory failure with hypoxia (HCC) likely secondary to new episode of aspiration unfortunate overall discomfort IV recurrent pattern appreciate pulmonology  consult regarding discussion of overall goals of care  . Essential hypertension stable continue home medications   .Chronic Atrial fibrillation (HCC)        - CHA2DS2 vas score 5 : continue current anticoagulation with Pradaxa          -  Rate control:  Currently controlled with  Metoprolol and Diltiazem will continue        - Rhythm control: none  . Aspiration pneumonia ([place]) treat with  broad-spectrum antibiotics, secondary to chronic aspiration discussed with patient overall poor prognosis and likelihood of recurrent admissions and recurrent pneumonia patient states he stands he does not wish to have a G-tube. Patient already on pured diet. Examination states he distends the risk but would like to continue been able to eat.  Palliative medicine consult pending.    Other plan as per orders.  DVT prophylaxis: Pradaxa  Code Status:  FULL CODE    as per patient    Family Communication:   Family not  at  Bedside   Disposition Plan:     To home once workup is complete and patient is stable                                                    Consults called: pulmonology, palliative medicine   Admission status:     inpatient       Level of care     SDU    Subjective: Pt without complaints, seen in ED   Objective: Vitals:   01/22/16 1300 01/22/16 1330 01/22/16  1413 01/22/16 1452  BP: 147/77 123/75 123/75   Pulse: 92 78 88   Resp: '17 15 14   '$ Temp:      TempSrc:      SpO2: 90% (!) 88% 90% (!) 87%  Weight:      Height:        Intake/Output Summary (Last 24 hours) at 01/22/16 1458 Last data filed at 01/21/16 2343  Gross per 24 hour  Intake              250 ml  Output                0 ml  Net              250 ml   Filed Weights   01/21/16 1550  Weight: 74.8 kg (165 lb)   Exam:  General:  Chronically ill appearing, no acute distress Neuro:  Awake, alert, no distress HEENT:  NCAT, OP clear  Cardiovascular:  RRR, no mgr Lungs:  Rhonchi bilaterally Abdomen:   BS+, soft, nontender Musculoskeletal:  Kyphosis, baseline diminished muscular tone at baseline Skin:  No rash or skin breakdown  Data Reviewed: Basic Metabolic Panel:  Recent Labs Lab 01/21/16 1601 01/22/16 0436  NA 137 135  K 4.3 3.9  CL 98* 99*  CO2 30 27  GLUCOSE 103* 135*  BUN 14 16  CREATININE 0.82 0.69  CALCIUM 9.4 8.9   Liver Function Tests:  Recent Labs Lab 01/21/16 1601 01/22/16 0436  AST 25 22  ALT 21 19  ALKPHOS 46 41  BILITOT 0.7 0.7  PROT 7.0 6.6  ALBUMIN 4.1 3.6   No results for input(s): LIPASE, AMYLASE in the last 168 hours. No results for input(s): AMMONIA in the last 168 hours. CBC:  Recent Labs Lab 01/21/16 1601 01/22/16 0436  WBC 9.5 12.0*  NEUTROABS 7.3 10.9*  HGB 14.1 13.6  HCT 42.9 40.5  MCV 89.7 88.2  PLT 234 238   Cardiac Enzymes: No results for input(s): CKTOTAL, CKMB, CKMBINDEX, TROPONINI in the last 168 hours. CBG (last 3)  No results for input(s): GLUCAP in the last 72 hours. No results found for this or any previous visit (from the past 240 hour(s)).   Studies: Ct Angio Chest Pe W And/or Wo Contrast  Result Date: 01/21/2016 CLINICAL DATA:  Shortness of breath, at risk for aspiration pneumonia. History of COPD, neuro muscular disorder, prostate and lung cancer, stroke. EXAM: CT ANGIOGRAPHY CHEST WITH CONTRAST TECHNIQUE: Multidetector CT imaging of the chest was performed using the standard protocol during bolus administration of intravenous contrast. Multiplanar CT image reconstructions and MIPs were obtained to evaluate the vascular anatomy. CONTRAST:  100 cc Isovue 370 COMPARISON:  Chest radiograph January 21, 2016 at 1557 hours and CT chest October 16, 2015. FINDINGS: CARDIOVASCULAR: Adequate contrast opacification of the pulmonary artery's. Main pulmonary artery is not enlarged. No pulmonary arterial filling defects to the level to the segmental branches. Heterogeneous subsegmental pulmonary artery contrast opacification  attributed to respiratory motion. Heart size is normal, no right heart strain. Mild calcific atherosclerosis of the coronary arteries. No pericardial effusions. Thoracic aorta is normal course and caliber, mild calcific atherosclerosis. MEDIASTINUM/NODES: No lymphadenopathy by CT size criteria. LUNGS/PLEURA: Secretions within knee lower trachea, bilateral bronchi and partial obscuration of lower lobe segmental and subsegmental bronchi. Bronchial wall thickening. Bibasilar bandlike densities without pleural effusion or focal consolidation. Improved aeration RIGHT middle lobe. UPPER ABDOMEN: Included view of the abdomen is unremarkable. MUSCULOSKELETAL: Subacute nondisplaced RIGHT posterior  eighth rib fracture without demonstrable interval healing. Pectus excavatum. Old T8 moderate to severe compression fracture. T9 pedicle screw tracks. T10 through included lumbar spine posterior instrumentation. Focal T8-9 kyphosis and interbody fusion. Review of the MIP images confirms the above findings. IMPRESSION: No acute pulmonary embolism. Debris within the trachea, and partially obscuring the bronchi compatible with aspiration. Bibasilar atelectasis, no focal consolidation. Subacute nondisplaced RIGHT posterior eighth rib fracture. Electronically Signed   By: Elon Alas M.D.   On: 01/21/2016 19:51   Dg Chest Port 1 View  Result Date: 01/21/2016 CLINICAL DATA:  Shortness of breath and wheezing. EXAM: PORTABLE CHEST 1 VIEW COMPARISON:  None. FINDINGS: The heart size and mediastinal contours are within normal limits. Both lungs are clear. Previous thoracolumbar spinal fusion. IMPRESSION: No active disease. Electronically Signed   By: Lorriane Shire M.D.   On: 01/21/2016 16:20     Scheduled Meds: . ARIPiprazole  5 mg Oral Daily  . atorvastatin  20 mg Oral q1800  . dabigatran  150 mg Oral Q12H  . diltiazem  240 mg Oral Daily  . divalproex  1,000 mg Oral QHS  . DULoxetine  120 mg Oral Daily  .  ipratropium-albuterol  3 mL Nebulization Q6H  . methylPREDNISolone (SOLU-MEDROL) injection  60 mg Intravenous Q12H  . metoprolol  50 mg Oral BID  . montelukast  10 mg Oral QHS  . primidone  250 mg Oral BID  . senna-docusate  1 tablet Oral BID   Continuous Infusions: . piperacillin-tazobactam (ZOSYN)  IV Stopped (01/22/16 0801)  . vancomycin Stopped (01/22/16 0930)    Active Problems:   Bipolar disorder (HCC)   Essential hypertension   Chronic lower back pain   CAD (coronary artery disease)   Cancer of lower lobe of right lung (HCC)   Chronic respiratory failure with hypoxia (HCC)   Aspiration pneumonia (HCC)   Paroxysmal atrial fibrillation (HCC)   Chronic anticoagulation   Acute exacerbation of bronchiectasis (HCC)   Pneumonia   Time spent:   Irwin Brakeman, MD, FAAFP [facility] Pager [phone]  If 7PM-7AM, please contact night-coverage [email or web address] Password [email or web address] 01/22/2016, 2:58 PM    LOS: 1 day

## 2016-01-22 NOTE — Progress Notes (Signed)
Spoke with Dr Tery Sanfilippo to discuss pt primary caregiver wife is being d/c from [facility] today

## 2016-01-22 NOTE — Progress Notes (Signed)
ED CM Consulted by [facility] liaison confirming pt is followed by [facility] reviewed EPIC to find pt listed with walker use and son and spouse as emergency contact  Pt wife also followed by [facility] and presently noted at [facility]

## 2016-01-22 NOTE — Progress Notes (Signed)
CPT held pt is receiving bed time med's, RN at bedside administering. Pt states that he is ready for bed .

## 2016-01-22 NOTE — Consult Note (Signed)
PULMONARY / CRITICAL CARE MEDICINE   Name: Malik Zuniga MRN: [id number] DOB: 12/07/1945    ADMISSION DATE:  01/21/2016 CONSULTATION DATE:  01/22/2016  REFERRING MD:  Dr. Wynetta Emery  CHIEF COMPLAINT:  Shortness of breath  HISTORY OF PRESENT ILLNESS:   [name] is my office patient who has COPD, bronchiectasis, aspiration due to dysphagia, and a history of adenocarcinoma of the lung he was frequently admitted to the hospital for flareups of bronchiectasis or pneumonia secondary to ongoing dysphagia who presented to the [facility] emergency department on 01/21/2016 per my recommendation. He noted feeling sick with nausea and vomiting on Saturday, December 9. This was not associated with fever or chills. However, shortness of breath cough, chest congestion worsened. He noted compliance with his chest physiotherapy vest at home. Because the dyspnea progressed his son brought him to the emergency department yesterday. He has been admitted by the hospitalist service and they have ordered antibiotics, chest physiotherapy, and Solu-Medrol for an acute flareup of his chronic lung disease.  PAST MEDICAL HISTORY :  He  has a past medical history of Anemia associated with acute blood loss; Angioedema (10/31/2010); Anxiety; Aspiration pneumonia ([place]) (2010); Asthma; Bacteremia due to Pseudomonas (10/04/2011); C V A / STROKE (02/20/2010); Chronic back pain; Constipation; Coronary artery disease; Depression; Emphysema of lung (HCC); GERD (gastroesophageal reflux disease); Headache, chronic daily; History of bronchitis; History of colon polyps; History of migraine; History of prostate cancer (2004); Hyperlipidemia; Hypertension; Insomnia; Lung cancer ([place]) (2016); Mood change ([place]); Myocardial infarction (04/1998); Neuromuscular disorder ([place]) (1998); On home O2; Shortness of breath; Stroke ([place]) (1998); and Tremor.  PAST SURGICAL HISTORY: He  has a past surgical history that includes Cholecystectomy (1996);  Craniotomy (1999); Coronary angioplasty with stent (2000); Hand surgery (1989); Ulnar nerve repair (1998); Gastrostomy w/ feeding tube (2008); Prostatectomy (04/2001); Coronary angioplasty with stent (04/1998); Carpal tunnel release (1998); Spine surgery; Brain surgery (1999); Back surgery (08/2004; 02/2005; 04/2006; 06/2007; 7/20101); Esophagogastroduodenoscopy (N/A, 07/23/2012); Colonoscopy; Back surgery (05/2013); and DG BIOPSY LUNG.  Allergies  Allergen Reactions  . Lisinopril Swelling    ANGIOEDEMA  . Lamictal [Lamotrigine] Other (See Comments)    Weakness/difficulty swallowing  . Ambien [Zolpidem] Other (See Comments)    Unknown reaction    No current facility-administered medications on file prior to encounter.    Current Outpatient Prescriptions on File Prior to Encounter  Medication Sig  . ARIPiprazole (ABILIFY) 10 MG tablet Take 5 mg by mouth daily.   . Ascorbic Acid (VITAMIN C) 1000 MG tablet Take 1,000 mg by mouth daily.  Marland Kitchen atorvastatin (LIPITOR) 20 MG tablet Take 1 tablet (20 mg total) by mouth daily at 6 PM.  . budesonide (PULMICORT) 0.25 MG/2ML nebulizer solution Take 4 mLs (0.5 mg total) by nebulization 2 (two) times daily.  . cholecalciferol (VITAMIN D) 1000 UNITS tablet Take 1,000 Units by mouth every morning.   . dabigatran (PRADAXA) 150 MG CAPS capsule Take 1 capsule (150 mg total) by mouth every 12 (twelve) hours.  . diazepam (VALIUM) 5 MG tablet Take 1 tablet (5 mg total) by mouth every 8 (eight) hours as needed for anxiety or muscle spasms. scheduled  . divalproex (DEPAKOTE) 500 MG 24 hr tablet Take 1,000 mg by mouth at bedtime.   . docusate sodium (COLACE) 100 MG capsule Take 200 mg by mouth at bedtime.   . DULoxetine (CYMBALTA) 60 MG capsule Take 120 mg by mouth daily.   . fluticasone (FLONASE) 50 MCG/ACT nasal spray PLACE 2 SPRAYS INTO BOTH NOSTRILS  DAILY. (Patient taking differently: PLACE 2 SPRAYS INTO BOTH NOSTRILS DAILY as needed for allergies)  .  ipratropium-albuterol (DUONEB) 0.5-2.5 (3) MG/3ML SOLN Take 3 mLs by nebulization every 6 (six) hours.  . metoprolol (LOPRESSOR) 50 MG tablet Take 1 tablet (50 mg total) by mouth 2 (two) times daily.  . montelukast (SINGULAIR) 10 MG tablet TAKE 1 TABLET AT BEDTIME  . nitroGLYCERIN (NITROSTAT) 0.4 MG SL tablet PLACE 1 TABLET (0.4 MG TOTAL) UNDER THE TONGUE EVERY 5 (FIVE) MINUTES AS NEEDED FOR CHEST PAIN.  Marland Kitchen omeprazole (PRILOSEC) 40 MG capsule Take 40 mg by mouth 2 (two) times daily.   . predniSONE (DELTASONE) 10 MG tablet 4 tabs x 2 days, then 3 tabs x2 days, 2 tabs x2 days, 1 tab x2 days and then 5 mg daily (Patient taking differently: Take 10 mg by mouth every morning. )  . primidone (MYSOLINE) 250 MG tablet TAKE 1 TABLET TWICE DAILY (Patient taking differently: TAKE '250mg'$  TABLET by mouth TWICE DAILY)  . Zinc 50 MG TABS Take 50 mg by mouth daily.   Marland Kitchen diltiazem (CARDIZEM CD) 240 MG 24 hr capsule TAKE 1 CAPSULE EVERY DAY  . feeding supplement, ENSURE ENLIVE, (ENSURE ENLIVE) LIQD Take 237 mLs by mouth 2 (two) times daily between meals.  Marland Kitchen guaiFENesin (ROBITUSSIN) 100 MG/5ML SOLN Take 5 mLs (100 mg total) by mouth every 4 (four) hours as needed for cough or to loosen phlegm. (Patient not taking: Reported on 01/21/2016)  . ranitidine (ZANTAC) 150 MG tablet Take 150-300 mg by mouth 2 (two) times daily as needed for heartburn.  Marland Kitchen tiZANidine (ZANAFLEX) 4 MG tablet TAKE 1 TABLET BY MOUTH EVERY 8 HOURS AS NEEDED FOR MUSCLE SPASMS. (Patient not taking: Reported on 01/21/2016)    FAMILY HISTORY:  His indicated that his mother is deceased. He indicated that his father is deceased. He indicated that his brother is alive. He indicated that his son is alive.    SOCIAL HISTORY: He  reports that he quit smoking about 3 years ago. His smoking use included Cigarettes. He has a 78.00 pack-year smoking history. He has never used smokeless tobacco. He reports that he does not drink alcohol or use drugs.  REVIEW OF  SYSTEMS:   Gen: Denies fever, + chills, weight change,  +fatigue, night sweats HEENT: Denies blurred vision, double vision, hearing loss, tinnitus, sinus congestion, rhinorrhea, sore throat, neck stiffness, dysphagia PULM: per HPI CV: Denies chest pain, edema, orthopnea, paroxysmal nocturnal dyspnea, palpitations GI: Denies abdominal pain, nausea, vomiting, diarrhea, hematochezia, melena, constipation, change in bowel habits GU: Denies dysuria, hematuria, polyuria, oliguria, urethral discharge Endocrine: Denies hot or cold intolerance, polyuria, polyphagia or appetite change Derm: Denies rash, dry skin, scaling or peeling skin change Heme: Denies easy bruising, bleeding, bleeding gums Neuro: Denies headache, numbness, weakness, slurred speech, loss of memory or consciousness   SUBJECTIVE:  As above  VITAL SIGNS: BP 139/72 (BP Location: Right Arm)   Pulse 91   Temp 97.4 F (36.3 C) (Oral)   Resp 19   Ht '5\' 9"'$  (1.753 m)   Wt 165 lb (74.8 kg)   SpO2 91%   BMI 24.37 kg/m   HEMODYNAMICS:    VENTILATOR SETTINGS:    INTAKE / OUTPUT: I/O last 3 completed shifts: In: 250 [IV Piggyback:250] Out: -   PHYSICAL EXAMINATION: General:  Chronically ill appearing, no acute distress Neuro:  Awake, alert, no distress HEENT:  NCAT, OP clear  Cardiovascular:  RRR, no mgr Lungs:  Rhonchi bilaterally Abdomen:  BS+, soft, nontender Musculoskeletal:  Kyphosis, baseline diminished muscular tone at baseline Skin:  No rash or skin breakdown  LABS:  BMET  Recent Labs Lab 01/21/16 1601 01/22/16 0436  NA 137 135  K 4.3 3.9  CL 98* 99*  CO2 30 27  BUN 14 16  CREATININE 0.82 0.69  GLUCOSE 103* 135*    Electrolytes  Recent Labs Lab 01/21/16 1601 01/22/16 0436  CALCIUM 9.4 8.9    CBC  Recent Labs Lab 01/21/16 1601 01/22/16 0436  WBC 9.5 12.0*  HGB 14.1 13.6  HCT 42.9 40.5  PLT 234 238    Coag's No results for input(s): APTT, INR in the last 168 hours.  Sepsis  Markers  Recent Labs Lab 01/21/16 1636  LATICACIDVEN 1.34    ABG  Recent Labs Lab 01/21/16 1648  PHART 7.409  PCO2ART 48.3*  PO2ART 65.4*    Liver Enzymes  Recent Labs Lab 01/21/16 1601 01/22/16 0436  AST 25 22  ALT 21 19  ALKPHOS 46 41  BILITOT 0.7 0.7  ALBUMIN 4.1 3.6    Cardiac Enzymes No results for input(s): TROPONINI, PROBNP in the last 168 hours.  Glucose No results for input(s): GLUCAP in the last 168 hours.  Imaging Ct Angio Chest Pe W And/or Wo Contrast  Result Date: 01/21/2016 CLINICAL DATA:  Shortness of breath, at risk for aspiration pneumonia. History of COPD, neuro muscular disorder, prostate and lung cancer, stroke. EXAM: CT ANGIOGRAPHY CHEST WITH CONTRAST TECHNIQUE: Multidetector CT imaging of the chest was performed using the standard protocol during bolus administration of intravenous contrast. Multiplanar CT image reconstructions and MIPs were obtained to evaluate the vascular anatomy. CONTRAST:  100 cc Isovue 370 COMPARISON:  Chest radiograph January 21, 2016 at 1557 hours and CT chest October 16, 2015. FINDINGS: CARDIOVASCULAR: Adequate contrast opacification of the pulmonary artery's. Main pulmonary artery is not enlarged. No pulmonary arterial filling defects to the level to the segmental branches. Heterogeneous subsegmental pulmonary artery contrast opacification attributed to respiratory motion. Heart size is normal, no right heart strain. Mild calcific atherosclerosis of the coronary arteries. No pericardial effusions. Thoracic aorta is normal course and caliber, mild calcific atherosclerosis. MEDIASTINUM/NODES: No lymphadenopathy by CT size criteria. LUNGS/PLEURA: Secretions within knee lower trachea, bilateral bronchi and partial obscuration of lower lobe segmental and subsegmental bronchi. Bronchial wall thickening. Bibasilar bandlike densities without pleural effusion or focal consolidation. Improved aeration RIGHT middle lobe. UPPER ABDOMEN:  Included view of the abdomen is unremarkable. MUSCULOSKELETAL: Subacute nondisplaced RIGHT posterior eighth rib fracture without demonstrable interval healing. Pectus excavatum. Old T8 moderate to severe compression fracture. T9 pedicle screw tracks. T10 through included lumbar spine posterior instrumentation. Focal T8-9 kyphosis and interbody fusion. Review of the MIP images confirms the above findings. IMPRESSION: No acute pulmonary embolism. Debris within the trachea, and partially obscuring the bronchi compatible with aspiration. Bibasilar atelectasis, no focal consolidation. Subacute nondisplaced RIGHT posterior eighth rib fracture. Electronically Signed   By: Elon Alas M.D.   On: 01/21/2016 19:51   Dg Chest Port 1 View  Result Date: 01/21/2016 CLINICAL DATA:  Shortness of breath and wheezing. EXAM: PORTABLE CHEST 1 VIEW COMPARISON:  None. FINDINGS: The heart size and mediastinal contours are within normal limits. Both lungs are clear. Previous thoracolumbar spinal fusion. IMPRESSION: No active disease. Electronically Signed   By: Lorriane Shire M.D.   On: 01/21/2016 16:20     STUDIES:  01/22/2016 CT angiogram images personally reviewed showing no evidence of a pulmonary embolism but some mucus  noted in the trachea and right mainstem, bronchiectasis right base stable, nondisplaced rib fracture noted on the right  CULTURES: 01/21/2016 sputum culture  ANTIBIOTICS: 01/21/2016 Zosyn 01/21/2016 vancomycin  SIGNIFICANT EVENTS:   LINES/TUBES:   DISCUSSION: 70 year old male with a past medical history significant for recurrent aspiration, COPD, bronchiectasis all leading to chronic respiratory failure with hypoxemia presented to the [facility] emergency department with acute on chronic respiratory failure with hypoxemia on 01/22/2016. The reason for this visit is another flare of bronchiectasis secondary to aspiration. He does not have pneumonia. He may have a flare of COPD. Overall  his chronic condition is severe and he is required multiple hospitalizations this year. While he is not emotionally prepared for hospice, it may be appropriate. I brought it up with him today and he became tearful. In general he states that he does not want to be hospitalized anymore but he wants to keep trying to fight and does not want to give up.  ASSESSMENT / PLAN:  PULMONARY A: Acute on chronic respiratory failure with hypoxemia Bronchiectasis flare COPD exacerbation Chronic aspiration P:   Continue vancomycin and Zosyn today, would likely stop back tomorrow I have changed order for chest physiotherapy every 8 hours Continue Solu-Medrol as you are doing Continue bronchodilators as you're doing Aspiration precautions, the patient has not been interested in participating in further evaluation or management of his dysphagia, he is not interested in a feeding tube Given his multiple recurrent hospitalizations I have consulted palliative medicine. I explained to him that I think the goals here would be to help establish goals of care overall. He would certainly be a candidate for hospice but is not emotionally ready right now.  Pulmonary medicine will follow  Roselie Awkward, MD [place] P[facility] Pager: [phone] Cell: [phone] After 3pm or if no response, call [phone]

## 2016-01-22 NOTE — Consult Note (Signed)
   [facility] Inpatient Consult   01/22/2016  ADRYEL WORTMANN May 23, 1945 [id number]    Made aware of Mr. Schriver ED admission by [facility] RNCM. Mr. Leppo is active with [facility] program. His wife is currently at [facility] but is being discharged home today per [facility] inpatient RNCM.   [place] indicates Mr. Paolini has been on a decline prior to admission. States Mr. [place] could benefit from a goals of care while inpatient. Will follow up with Mr. Sabino when he is admitted to the floor. Currently in the ED. Spoke with ED RNCM to discuss all of the above. Will continue to follow along.  Marthenia Rolling, MSN-Ed, RN,BSN [facility] Liaison [phone]

## 2016-01-22 NOTE — ED Notes (Signed)
Delay in administering cardizem related to awaiting cardizem to be sent from main pharmacy.

## 2016-01-22 NOTE — ED Notes (Signed)
Waiting for meds from pharmacy. Pharmacy notified that patient remains in ED.

## 2016-01-23 ENCOUNTER — Telehealth: Payer: Self-pay | Admitting: Pulmonary Disease

## 2016-01-23 ENCOUNTER — Encounter (HOSPITAL_COMMUNITY): Payer: Self-pay

## 2016-01-23 ENCOUNTER — Ambulatory Visit: Payer: Self-pay | Admitting: *Deleted

## 2016-01-23 ENCOUNTER — Inpatient Hospital Stay (HOSPITAL_COMMUNITY): Payer: Medicare Other

## 2016-01-23 DIAGNOSIS — R1311 Dysphagia, oral phase: Secondary | ICD-10-CM

## 2016-01-23 DIAGNOSIS — J441 Chronic obstructive pulmonary disease with (acute) exacerbation: Secondary | ICD-10-CM

## 2016-01-23 DIAGNOSIS — C3431 Malignant neoplasm of lower lobe, right bronchus or lung: Secondary | ICD-10-CM

## 2016-01-23 DIAGNOSIS — I10 Essential (primary) hypertension: Secondary | ICD-10-CM

## 2016-01-23 DIAGNOSIS — I480 Paroxysmal atrial fibrillation: Secondary | ICD-10-CM

## 2016-01-23 DIAGNOSIS — F310 Bipolar disorder, current episode hypomanic: Secondary | ICD-10-CM

## 2016-01-23 DIAGNOSIS — G8929 Other chronic pain: Secondary | ICD-10-CM

## 2016-01-23 DIAGNOSIS — J690 Pneumonitis due to inhalation of food and vomit: Principal | ICD-10-CM

## 2016-01-23 DIAGNOSIS — M545 Low back pain: Secondary | ICD-10-CM

## 2016-01-23 DIAGNOSIS — Z7901 Long term (current) use of anticoagulants: Secondary | ICD-10-CM

## 2016-01-23 LAB — COMPREHENSIVE METABOLIC PANEL
ALBUMIN: 3.2 g/dL — AB (ref 3.5–5.0)
ALT: 17 U/L (ref 17–63)
ANION GAP: 8 (ref 5–15)
AST: 19 U/L (ref 15–41)
Alkaline Phosphatase: 33 U/L — ABNORMAL LOW (ref 38–126)
BILIRUBIN TOTAL: 0.6 mg/dL (ref 0.3–1.2)
BUN: 19 mg/dL (ref 6–20)
CALCIUM: 8.9 mg/dL (ref 8.9–10.3)
CO2: 29 mmol/L (ref 22–32)
Chloride: 98 mmol/L — ABNORMAL LOW (ref 101–111)
Creatinine, Ser: 0.76 mg/dL (ref 0.61–1.24)
GFR calc non Af Amer: >60 mL/min (ref >60)
GLUCOSE: 151 mg/dL — AB (ref 65–99)
POTASSIUM: 4.1 mmol/L (ref 3.5–5.1)
SODIUM: 135 mmol/L (ref 135–145)
TOTAL PROTEIN: 6.1 g/dL — AB (ref 6.5–8.1)

## 2016-01-23 LAB — CBC WITH DIFFERENTIAL/PLATELET
BASOS PCT: 0 %
Basophils Absolute: 0 10*3/uL (ref 0.0–0.1)
EOS ABS: 0 10*3/uL (ref 0.0–0.7)
Eosinophils Relative: 0 %
HCT: 37.5 % — ABNORMAL LOW (ref 39.0–52.0)
Hemoglobin: 12.6 g/dL — ABNORMAL LOW (ref 13.0–17.0)
LYMPHS ABS: 1 10*3/uL (ref 0.7–4.0)
Lymphocytes Relative: 7 %
MCH: 29.6 pg (ref 26.0–34.0)
MCHC: 33.6 g/dL (ref 30.0–36.0)
MCV: 88.2 fL (ref 78.0–100.0)
MONOS PCT: 2 %
Monocytes Absolute: 0.3 10*3/uL (ref 0.1–1.0)
Neutro Abs: 12.7 10*3/uL — ABNORMAL HIGH (ref 1.7–7.7)
Neutrophils Relative %: 91 %
Platelets: 231 10*3/uL (ref 150–400)
RBC: 4.25 MIL/uL (ref 4.22–5.81)
RDW: 16.5 % — AB (ref 11.5–15.5)
WBC: 14 10*3/uL — ABNORMAL HIGH (ref 4.0–10.5)

## 2016-01-23 MED ORDER — PREDNISONE 50 MG PO TABS
60.0000 mg | ORAL_TABLET | Freq: Every day | ORAL | Status: DC
  Administered 2016-01-24: 60 mg via ORAL
  Filled 2016-01-23: qty 1

## 2016-01-23 MED ORDER — CYCLOBENZAPRINE HCL 5 MG PO TABS
5.0000 mg | ORAL_TABLET | Freq: Three times a day (TID) | ORAL | Status: DC | PRN
  Administered 2016-01-23: 5 mg via ORAL
  Filled 2016-01-23 (×2): qty 1

## 2016-01-23 MED ORDER — RESOURCE THICKENUP CLEAR PO POWD
ORAL | Status: DC | PRN
  Filled 2016-01-23: qty 125

## 2016-01-23 MED ORDER — AMOXICILLIN-POT CLAVULANATE 875-125 MG PO TABS
1.0000 | ORAL_TABLET | Freq: Two times a day (BID) | ORAL | Status: DC
  Administered 2016-01-23 – 2016-01-24 (×2): 1 via ORAL
  Filled 2016-01-23 (×2): qty 1

## 2016-01-23 MED ORDER — METHYLPREDNISOLONE SODIUM SUCC 125 MG IJ SOLR
60.0000 mg | Freq: Once | INTRAMUSCULAR | Status: AC
  Administered 2016-01-23: 60 mg via INTRAVENOUS
  Filled 2016-01-23: qty 2

## 2016-01-23 NOTE — Progress Notes (Signed)
Modified Barium Swallow Progress Note  Patient Details  Name: Malik Zuniga MRN: [id number] Date of Birth: 1946/01/22  Today's Date: 01/23/2016  Modified Barium Swallow completed.  Full report located under Chart Review in the Imaging Section.  Brief recommendations include the following:  Clinical Impression  Pt demonstrates a moderate to severe oropharyngeal dysphagia primarily due to structural changes of the oropharynx due to curvature of cervical spine. Swallow is relatively timely, but pharyngeal peristalsis and opening of the cricopharyngeal is insufficient to fully transit the bolus. The curvature of the spine is concave, leaving a wide pharynx, impacting the function of the hyolaryngeal mechanism for bolus propulsion. After the swallow there are moderate to severe residuals in the valleculae, pyriforms and pharyngeal ears that are silently penetrated/aspirated post swallow with thin liquids. Small sips and a chin tuck do improve the quantity of aspiration with thin liquids, but do not fully prevent aspiration. Given the severity of this pts recurrent pna and his preference to prevent aspiration as much as possible, recommend nectar thick liquids with two swallows to clear residuals. Pt also observed to masticate and transit solids well with two swallows. Pt primarily eats purees due to his feeling that he gets choked with solids. Suspect there may be an esophageal component with stasis with full meal amounts of solids. Encouraged pt to attempt small amounts of soft solids for comfort rather than exclusively eating purees. Also returned to pts room and educated pts daughter in law on precautions and instructions for thickening as pt may d/c home tomorrow. Discussed the potential for a water protocol in the future if pt does not tolerate thickened liquids. Daughter in law repeated instructions. Recommend f/u with Home Health to reinforce precautions and strategies.    Swallow Evaluation  Recommendations       SLP Diet Recommendations: Dysphagia 2 (Fine chop) solids;Nectar thick liquid   Liquid Administration via: Cup   Medication Administration: Whole meds with puree   Supervision: Patient able to self feed   Compensations: Slow rate;Small sips/bites;Follow solids with liquid       Oral Care Recommendations: Oral care BID   Other Recommendations: Order thickener from pharmacy    Norwin Aleman, Katherene Ponto 01/23/2016,3:15 PM

## 2016-01-23 NOTE — Consult Note (Signed)
   [facility] CM Inpatient Consult   01/23/2016  OTT ZIMMERLE Dec 31, 1945 [id number]    Spoke with [place]. Chart reviewed and noted palliative medicine consult notes and recommendations. Telephone call to inpatient RNCM to discuss that [facility] is active. Inpatient RNCM indicates she will follow up on outpatient home based palliative medicine referral. Will continue to follow.    Marthenia Rolling, MSN-Ed, RN,BSN [facility] Liaison [phone]

## 2016-01-23 NOTE — Evaluation (Signed)
Clinical/Bedside Swallow Evaluation Patient Details  Name: Malik Zuniga MRN: [id number] Date of Birth: 12-05-1945  Today's Date: 01/23/2016 Time: SLP Start Time (ACUTE ONLY): 1210 SLP Stop Time (ACUTE ONLY): 1222 SLP Time Calculation (min) (ACUTE ONLY): 12 min  Past Medical History:  Past Medical History:  Diagnosis Date  . Anemia associated with acute blood loss   . Angioedema 10/31/2010  . Anxiety    takes Valium daily  . Aspiration pneumonia ([place]) 2010  . Asthma   . Bacteremia due to Pseudomonas 10/04/2011   D/t gram neg rods   . Bipolar affective disorder ([place])   . 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 Chronic back pain    compression fracture  . Constipation    takes Colace daily as well as Miralax  . Coronary artery disease   . Depression    takes Cymbalta daily  . Emphysema of lung (H[facility])    Albuterol as needed;Symbicort daily and Singulair at bedtime  . GERD (gastroesophageal reflux disease)    takes Omeprazole daily  . Headache, chronic daily   . History of bronchitis   . History of colon polyps   . History of migraine    last migraine a couple of days ago;takes Excedrin Migraine  . History of prostate cancer 2004  . Hyperlipidemia    takes Simvastatin daily  . Hypertension    takes Metoprolol daily as well as Hyzaar  . Insomnia    takes Benadryl nightly  . Lung cancer ([place]) 2016  . Mood change ([place])    after Brain surgery mood changed and was placed on Depakote  . Myocardial infarction 04/1998  . Neuromuscular disorder ([place]) 1998   right carpal tunnel release  . On home O2   . Shortness of breath    with exertion  . Stroke [facility]) 1998   Brain Aneurysm  . Tremor    Past Surgical History:  Past Surgical History:  Procedure Laterality Date  . BACK SURGERY  08/2004; 02/2005; 04/2006; 06/2007; 7/20101   all by Dr. Annette Stable  . BACK SURGERY  05/2013  . BRAIN SURGERY  1999   clip aneurysm  . [place]   right  . CHOLECYSTECTOMY  1996  . COLONOSCOPY    . CORONARY ANGIOPLASTY WITH STENT PLACEMENT  2000   RCA stent, Dr. Rollene Fare; denies stent for PAD 06/15/13  . CORONARY ANGIOPLASTY WITH STENT PLACEMENT  04/1998  . CRANIOTOMY  1999   to clip aneurism, Dr. Annette Stable  . DG BIOPSY LUNG    . ESOPHAGOGASTRODUODENOSCOPY N/A 07/23/2012   Procedure: ESOPHAGOGASTRODUODENOSCOPY (EGD);  Surgeon: Jeryl Columbia, MD;  Location: [facility] ENDOSCOPY;  Service: Endoscopy;  Laterality: N/A;  buccini /ja  . GASTROSTOMY W/ FEEDING TUBE  2008   Dr. Watt Climes; only had for a few months  . HAND SURGERY  1989   crushed thumb, Dr. Fredna Dow  . PROSTATECTOMY  04/2001   removal of prostate cancer, Dr. Janice Norrie  . SPINE SURGERY    . ULNAR NERVE REPAIR  1998   left arm, Dr. Fredna Dow   HPI:  Pt is a 70 year old male with a history of  COPD, bronchiectasis, adenocarcinomna and aspiration pna. He has had mulitple swallow evaluations in the past that show a mild dysphagia that would not necessarily account for the recurrent aspiration pna he has suffered from. 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 He and his wife feel he tolerates pureed solids best. He has consumed thin liquids up this point, though MD has put him on honey thick liquids. He is consulting with palliative care, but is still a full code and reports he wants to live. He also reports he does not want a PEG and wants to drink thin liquids, but would agree to thick liquids if they would prevent aspiration.    Assessment / Plan / Recommendation Clinical Impression  Pt demonstrates immediate throat clearing with thin liquids, though he disagrees that this was related to aspiration. Pt is full of contradictions; he both reports he wants to do everything necessary to live, but also verbalizes many wishes more consistent with comfort care, such as no more testing, no thickened liquids, no PEG tube. Given that no prior test has recommended thickened liquids  for this pt, would be reluctant to recommend thickening liquids with only subjective observation, particularly given research that indicates more negative impact of aspiration of thickened liquids compared with thin. [place] encouraged pt to participate in another MBS for better decision making, he agreed on the condition it would not delay his discharge. SLP will attempt MBS this pm.     Aspiration Risk  Moderate aspiration risk    Diet Recommendation Dysphagia 1 (Puree);Honey-thick liquid   Liquid Administration via: Cup Medication Administration: Crushed with puree    Other  Recommendations Oral Care Recommendations: Oral care BID   Follow up Recommendations        Frequency and Duration            Prognosis        Swallow Study   General HPI: Pt is a 70 year old male with a history of  COPD, bronchiectasis, adenocarcinomna and aspiration pna. He has had mulitple swallow evaluations in the past that show a mild dysphagia that would not necessarily account for the recurrent aspiration pna he has suffered from. 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 He and his wife feel he tolerates pureed solids best. He has consumed thin liquids up this point, though MD has put him on honey thick liquids. He is consulting with palliative care, but is still a full code and reports he wants to live. He also reports he does not want a PEG and wants to drink thin liquids, but would agree to thick liquids if they would prevent aspiration.  Type of Study: Bedside Swallow Evaluation Previous Swallow Assessment: last MBS 6/17 - dys 2/thin, though downgraded to dys 1 next session.  Diet Prior to this Study: Dysphagia 1 (puree);Honey-thick liquids Temperature Spikes Noted: No History of Recent Intubation: No Behavior/Cognition: Alert;Cooperative;Pleasant mood Oral Cavity Assessment: Within Functional Limits Oral Care Completed by SLP: No Oral Cavity - Dentition:  Adequate natural dentition Vision: Functional for self-feeding Self-Feeding Abilities: Able to feed self Patient Positioning: Partially reclined (reclined back, head forward leaning. ) Baseline Vocal Quality: Normal Volitional Cough: Strong Volitional Swallow: Able to elicit    Oral/Motor/Sensory Function Overall Oral Motor/Sensory Function: Within functional limits   Ice Chips     Thin Liquid Thin Liquid: Impaired Presentation: Cup;Self Fed Pharyngeal  Phase Impairments: Throat Clearing - Immediate    Nectar Thick Nectar Thick Liquid: Not tested   Honey Thick Honey Thick Liquid: Not tested   Puree Puree: Not tested   Solid   GO   Solid: Not tested       Herbie Baltimore,  MA CCC-SLP [phone]  Kazuo Durnil, Katherene Ponto 01/23/2016,12:38 PM

## 2016-01-23 NOTE — Progress Notes (Signed)
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 Diovan, CVA combine systolic diastolic CHF chronic Presented with to 3 days of worsening fatigue worsening shortness of breath cough with dark mucus associated wheezing no fevers nausea vomiting no chest pain his family has called to primary care provider is very concerned.. EMS arrived to home on patient to have extensive wheezing is given nebulizer treatment and to Solu-Medrol placed on CPAP On arrival to emergency department required 5 L with oxygen saturation only up to 86% Patient has recurrent admissions for aspiration pneumonia is been followed by pulmonology for COPD and bronchiectasis currently using a therapy vest he is on chronic steroids for his COPD/bronchiectasis. PCCM Consulted.    Assessment & Plan:   Principal Problem:   Acute exacerbation of bronchiectasis ([place]) Active Problems:   Aspiration pneumonia ([place])   Bipolar disorder ([place])   Essential hypertension   Chronic lower back pain   CAD (coronary artery disease)   Cancer of lower lobe of right lung (HCC)   Chronic respiratory failure with hypoxia (HCC)   Paroxysmal atrial fibrillation (HCC)   Chronic anticoagulation   Pneumonia  #1 acute on chronic respiratory failure with hypoxia likely secondary to aspiration pneumonia and acute exacerbation of bronchiectasis. Patient was admitted CT of the chest which was obtained was negative for PE however did show debris within the trachea, and partially obscuring bronchi compatible with aspiration. Bibasilar atelectasis is no  focal consultation. Pulmonary and critical care medicine was consulted and recommended continuation of empiric antibiotics of IV vancomycin and IV Zosyn, chest physiotherapy every 8 hours, IV Solu-Medrol and bronchodilators as well as recommended a palliative care consult. Patient improved clinically and as such IV vancomycin be discontinued today. Patient currently on IV Zosyn. Will transition to oral Augmentin and taper IV Solu-Medrol to oral prednisone taper back to his home dose of 5 mg daily. Continue chest physiotherapy. Patient will need to follow-up with pulmonary in the outpatient setting.  #2 chronic atrial fibrillation CHA2DS2VASC 5 Continue metoprolol and diltiazem for rate control. Continue PRADAXA for anticoagulation.   #3 bipolar disorder Stable. Continue Abilify and Depakote.  #4 coronary artery disease Stable. Continue Lipitor, Pradaxa, Cardizem, Lopressor.  #5 chronic low back pain Stable. Norco as needed.  #6 hypertension Stable. Continue metoprolol and diltiazem.  #7 dysphagia Patient noted to be on pured diet prior to admission. Patient does not 1 any artificial means of feeding. Patient understands the risk of aspiration and is willing to accept it. Patient has been seen in consultation by speech therapy and patient underwent a modified barium swallow and recommended that patient be on dysphagia 2 diet with honey thick liquids. Outpatient follow-up with speech therapy.  #8 cancer of the right lower lobe lung stage I (diagnosed 2016) Outpatient follow-up with pulmonary and oncology.   DVT prophylaxis: Pradaxa Code Status: Full Family Communication: Updated patient. No family at bedside. Disposition Plan: Home with home health and outpatient palliative care to follow.    Consultants:   PCCM: Dr [name] 01/22/2016  Palliative care 01/23/2016  Procedures:   CT angiogram chest 01/21/2016  Chest x-ray 01/21/2016  Modified barium swallow  01/23/2016  Antimicrobials:   IV Zosyn 01/21/2016  IV vancomycin 01/21/2016>>>> 01/23/2016   Subjective:  Patient denies SOB, no CP. Patient tolerating pure diet.  Objective: Vitals:   01/22/16 2122 01/23/16 0532 01/23/16 0855 01/23/16 1434  BP:  130/66  122/69  Pulse:  76  81  Resp:  18  18  Temp:  98.4 F (36.9 C)  97.9 F (36.6 C)  TempSrc:  Oral  Axillary  SpO2: 93% 95% 96% 96%  Weight:      Height:        Intake/Output Summary (Last 24 hours) at 01/23/16 1618 Last data filed at 01/23/16 1327  Gross per 24 hour  Intake              890 ml  Output              500 ml  Net              390 ml   Filed Weights   01/21/16 1550 01/22/16 1559  Weight: 74.8 kg (165 lb) 76.8 kg (169 lb 5 oz)    Examination:  General exam: Appears calm and comfortable  Respiratory system: Coarse BS diffusely. Respiratory effort normal. Cardiovascular system: S1 & S2 heard, RRR. No JVD, murmurs, rubs, gallops or clicks. No pedal edema. Gastrointestinal system: Abdomen is nondistended, soft and nontender. No organomegaly or masses felt. Normal bowel sounds heard. Central nervous system: Alert and oriented. No focal neurological deficits. Extremities: Symmetric 5 x 5 power. Skin: No rashes, lesions or ulcers Psychiatry: Judgement and insight appear normal. Mood & affect appropriate.     Data Reviewed: I have personally reviewed following labs and imaging studies  CBC:  Recent Labs Lab 01/21/16 1601 01/22/16 0436 01/23/16 0543  WBC 9.5 12.0* 14.0*  NEUTROABS 7.3 10.9* 12.7*  HGB 14.1 13.6 12.6*  HCT 42.9 40.5 37.5*  MCV 89.7 88.2 88.2  PLT 234 238 947   Basic Metabolic Panel:  Recent Labs Lab 01/21/16 1601 01/22/16 0436 01/23/16 0543  NA 137 135 135  K 4.3 3.9 4.1  CL 98* 99* 98*  CO2 '30 27 29  '$ GLUCOSE 103* 135* 151*  BUN '14 16 19  '$ CREATININE 0.82 0.69 0.76  CALCIUM 9.4 8.9 8.9   GFR: Estimated Creatinine Clearance: 85.9 mL/min (by C-G formula based on SCr  of 0.76 mg/dL). Liver Function Tests:  Recent Labs Lab 01/21/16 1601 01/22/16 0436 01/23/16 0543  AST '25 22 19  '$ ALT '21 19 17  '$ ALKPHOS 46 41 33*  BILITOT 0.7 0.7 0.6  PROT 7.0 6.6 6.1*  ALBUMIN 4.1 3.6 3.2*   No results for input(s): LIPASE, AMYLASE in the last 168 hours. No results for input(s): AMMONIA in the last 168 hours. Coagulation Profile: No results for input(s): INR, PROTIME in the last 168 hours. Cardiac Enzymes: No results for input(s): CKTOTAL, CKMB, CKMBINDEX, TROPONINI in the last 168 hours. BNP (last 3 results) No results for input(s): PROBNP in the last 8760 hours. HbA1C: No results for input(s): HGBA1C in the last 72 hours. CBG: No results for input(s): GLUCAP in the last 168 hours. Lipid Profile: No results for input(s): CHOL, HDL, LDLCALC, TRIG, CHOLHDL, LDLDIRECT in the last 72 hours. Thyroid Function Tests: No results for input(s): TSH, T4TOTAL, FREET4, T3FREE, THYROIDAB in the last 72 hours. Anemia Panel: No results for input(s): VITAMINB12, FOLATE, FERRITIN, TIBC, IRON, RETICCTPCT in the last 72 hours. Sepsis Labs:  Recent Labs Lab 01/21/16 1636  LATICACIDVEN 1.34    No results found  for this or any previous visit (from the past 240 hour(s)).       Radiology Studies: Ct Angio Chest Pe W And/or Wo Contrast  Result Date: 01/21/2016 CLINICAL DATA:  Shortness of breath, at risk for aspiration pneumonia. History of COPD, neuro muscular disorder, prostate and lung cancer, stroke. EXAM: CT ANGIOGRAPHY CHEST WITH CONTRAST TECHNIQUE: Multidetector CT imaging of the chest was performed using the standard protocol during bolus administration of intravenous contrast. Multiplanar CT image reconstructions and MIPs were obtained to evaluate the vascular anatomy. CONTRAST:  100 cc Isovue 370 COMPARISON:  Chest radiograph January 21, 2016 at 1557 hours and CT chest October 16, 2015. FINDINGS: CARDIOVASCULAR: Adequate contrast opacification of the pulmonary  artery's. Main pulmonary artery is not enlarged. No pulmonary arterial filling defects to the level to the segmental branches. Heterogeneous subsegmental pulmonary artery contrast opacification attributed to respiratory motion. Heart size is normal, no right heart strain. Mild calcific atherosclerosis of the coronary arteries. No pericardial effusions. Thoracic aorta is normal course and caliber, mild calcific atherosclerosis. MEDIASTINUM/NODES: No lymphadenopathy by CT size criteria. LUNGS/PLEURA: Secretions within knee lower trachea, bilateral bronchi and partial obscuration of lower lobe segmental and subsegmental bronchi. Bronchial wall thickening. Bibasilar bandlike densities without pleural effusion or focal consolidation. Improved aeration RIGHT middle lobe. UPPER ABDOMEN: Included view of the abdomen is unremarkable. MUSCULOSKELETAL: Subacute nondisplaced RIGHT posterior eighth rib fracture without demonstrable interval healing. Pectus excavatum. Old T8 moderate to severe compression fracture. T9 pedicle screw tracks. T10 through included lumbar spine posterior instrumentation. Focal T8-9 kyphosis and interbody fusion. Review of the MIP images confirms the above findings. IMPRESSION: No acute pulmonary embolism. Debris within the trachea, and partially obscuring the bronchi compatible with aspiration. Bibasilar atelectasis, no focal consolidation. Subacute nondisplaced RIGHT posterior eighth rib fracture. Electronically Signed   By: Elon Alas M.D.   On: 01/21/2016 19:51   Dg Swallowing Func-speech Pathology  Result Date: 01/23/2016 Objective Swallowing Evaluation: Type of Study: MBS-Modified Barium Swallow Study Patient Details Name: LAKER  MRN: [id number] Date of Birth: 03-31-1945 Today's Date: 01/23/2016 Time: SLP Start Time (ACUTE ONLY): 1347-SLP Stop Time (ACUTE ONLY): 1422 SLP Time Calculation (min) (ACUTE ONLY): 35 min Past Medical History: Past Medical History: Diagnosis Date .  Anemia associated with acute blood loss  . Angioedema 10/31/2010 . Anxiety   takes Valium daily . Aspiration pneumonia ([place]) 2010 . Asthma  . Bacteremia due to Pseudomonas 10/04/2011  D/t gram neg rods  . Bipolar affective disorder ([place])  . C V A / STROKE 02/20/2010  Qualifier: Diagnosis of  By: Charma Igo   . Chronic back pain   compression fracture . Constipation   takes Colace daily as well as Miralax . Coronary artery disease  . Depression   takes Cymbalta daily . Emphysema of lung (HCC)   Albuterol as needed;Symbicort daily and Singulair at bedtime . GERD (gastroesophageal reflux disease)   takes Omeprazole daily . Headache, chronic daily  . History of bronchitis  . History of colon polyps  . History of migraine   last migraine a couple of days ago;takes Excedrin Migraine . History of prostate cancer 2004 . Hyperlipidemia   takes Simvastatin daily . Hypertension   takes Metoprolol daily as well as Hyzaar . Insomnia   takes Benadryl nightly . Lung cancer ([place]) 2016 . Mood change ([place])   after Brain surgery mood changed and was placed on Depakote . Myocardial infarction 04/1998 . Neuromuscular disorder ([place]) 1998  right carpal tunnel release . On  home O2  . Shortness of breath   with exertion . Stroke [facility]) 1998  Brain Aneurysm . Tremor  Past Surgical History: Past Surgical History: Procedure Laterality Date . BACK SURGERY  08/2004; 02/2005; 04/2006; 06/2007; 7/20101  all by Dr. Annette Stable . BACK SURGERY  05/2013 . BRAIN SURGERY  1999  clip aneurysm . [place]  right . CHOLECYSTECTOMY  1996 . COLONOSCOPY   . CORONARY ANGIOPLASTY WITH STENT PLACEMENT  2000  RCA stent, Dr. Rollene Fare; denies stent for PAD 06/15/13 . CORONARY ANGIOPLASTY WITH STENT PLACEMENT  04/1998 . CRANIOTOMY  1999  to clip aneurism, Dr. Annette Stable . DG BIOPSY LUNG   . ESOPHAGOGASTRODUODENOSCOPY N/A 07/23/2012  Procedure: ESOPHAGOGASTRODUODENOSCOPY (EGD);  Surgeon: Jeryl Columbia, MD;  Location: [facility] ENDOSCOPY;  Service: Endoscopy;  Laterality: N/A;   buccini /ja . GASTROSTOMY W/ FEEDING TUBE  2008  Dr. Watt Climes; only had for a few months . HAND SURGERY  1989  crushed thumb, Dr. Fredna Dow . PROSTATECTOMY  04/2001  removal of prostate cancer, Dr. Janice Norrie . SPINE SURGERY   . ULNAR NERVE REPAIR  1998  left arm, Dr. Fredna Dow HPI: Pt is a 70 year old male with a history of  COPD, bronchiectasis, adenocarcinomna and aspiration pna. He has had mulitple swallow evaluations in the past that show a mild dysphagia that would not necessarily account for the recurrent aspiration pna he has suffered from. 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 He and his wife feel he tolerates pureed solids best. He has consumed thin liquids up this point, though MD has put him on honey thick liquids. He is consulting with palliative care, but is still a full code and reports he wants to live. He also reports he does not want a PEG and wants to drink thin liquids, but would agree to thick liquids if they would prevent aspiration.  No Data Recorded Assessment / Plan / Recommendation CHL IP CLINICAL IMPRESSIONS 01/23/2016 Therapy Diagnosis Severe pharyngeal phase dysphagia;Moderate pharyngeal phase dysphagia Clinical Impression Pt demonstrates a moderate to severe oropharyngeal dysphagia primarily due to structural changes of the oropharynx due to curvature of cervical spine. Swallow is relatively timely, but pharyngeal peristalsis and opening of the cricopharyngeal is insufficient to fully transit the bolus. The curvature of the spine is concave, leaving a wide pharynx, impacting the function of the hyolaryngeal mechanism for bolus propulsion. After the swallow there are moderate to severe residuals in the valleculae, pyriforms and pharyngeal ears that are silently penetrated/aspirated post swallow with thin liquids. Small sips and a chin tuck do improve the quantity of aspiration with thin liquids, but do not fully prevent aspiration. Given the severity of this pts  recurrent pna and his preference to prevent aspiration as much as possible, recommend nectar thick liquids with two swallows to clear residuals. Pt also observed to masticate and transit solids well with two swallows. Pt primarily eats purees due to his feeling that he gets choked with solids. Suspect there may be an esophageal component with stasis with full meal amounts of solids. Encouraged pt to attempt small amounts of soft solids for comfort rather than exclusively eating purees. Also returned to pts room and educated pts daughter in law on precautions and instructions for thickening as pt may d/c home tomorrow. Discussed the potential for a water protocol in the future if pt does not tolerate thickened liquids. Daughter in law repeated instructions. Recommend f/u with Home Health to reinforce  precautions and strategies.  Impact on safety and function Severe aspiration risk;Moderate aspiration risk   CHL IP TREATMENT RECOMMENDATION 01/23/2016 Treatment Recommendations Therapy as outlined in treatment plan below   Prognosis 01/23/2016 Prognosis for Safe Diet Advancement Guarded Barriers to Reach Goals Severity of deficits Barriers/Prognosis Comment -- CHL IP DIET RECOMMENDATION 01/23/2016 SLP Diet Recommendations Dysphagia 2 (Fine chop) solids;Nectar thick liquid Liquid Administration via Cup Medication Administration Whole meds with puree Compensations Slow rate;Small sips/bites;Follow solids with liquid Postural Changes --   CHL IP OTHER RECOMMENDATIONS 01/23/2016 Recommended Consults -- Oral Care Recommendations Oral care BID Other Recommendations Order thickener from pharmacy   CHL IP FOLLOW UP RECOMMENDATIONS 01/23/2016 Follow up Recommendations Home health SLP   CHL IP FREQUENCY AND DURATION 01/23/2016 Speech Therapy Frequency (ACUTE ONLY) min 2x/week Treatment Duration 2 weeks      CHL IP ORAL PHASE 01/23/2016 Oral Phase WFL Oral - Pudding Teaspoon -- Oral - Pudding Cup -- Oral - Honey Teaspoon -- Oral -  Honey Cup -- Oral - Nectar Teaspoon -- Oral - Nectar Cup -- Oral - Nectar Straw -- Oral - Thin Teaspoon -- Oral - Thin Cup -- Oral - Thin Straw -- Oral - Puree -- Oral - Mech Soft -- Oral - Regular -- Oral - Multi-Consistency -- Oral - Pill -- Oral Phase - Comment --  CHL IP PHARYNGEAL PHASE 01/23/2016 Pharyngeal Phase Impaired Pharyngeal- Pudding Teaspoon -- Pharyngeal -- Pharyngeal- Pudding Cup -- Pharyngeal -- Pharyngeal- Honey Teaspoon -- Pharyngeal -- Pharyngeal- Honey Cup -- Pharyngeal -- Pharyngeal- Nectar Teaspoon -- Pharyngeal -- Pharyngeal- Nectar Cup Reduced anterior laryngeal mobility;Pharyngeal residue - valleculae;Pharyngeal residue - pyriform;Compensatory strategies attempted (with notebox);Reduced pharyngeal peristalsis Pharyngeal -- Pharyngeal- Nectar Straw -- Pharyngeal -- Pharyngeal- Thin Teaspoon -- Pharyngeal -- Pharyngeal- Thin Cup Reduced pharyngeal peristalsis;Reduced anterior laryngeal mobility;Penetration/Apiration after swallow;Significant aspiration (Amount);Pharyngeal residue - valleculae;Pharyngeal residue - pyriform;Compensatory strategies attempted (with notebox) Pharyngeal -- Pharyngeal- Thin Straw -- Pharyngeal -- Pharyngeal- Puree Reduced anterior laryngeal mobility;Pharyngeal residue - valleculae;Pharyngeal residue - pyriform;Compensatory strategies attempted (with notebox);Reduced pharyngeal peristalsis Pharyngeal -- Pharyngeal- Mechanical Soft Reduced anterior laryngeal mobility;Pharyngeal residue - valleculae;Pharyngeal residue - pyriform;Compensatory strategies attempted (with notebox);Reduced pharyngeal peristalsis Pharyngeal -- Pharyngeal- Regular -- Pharyngeal -- Pharyngeal- Multi-consistency -- Pharyngeal -- Pharyngeal- Pill -- Pharyngeal -- Pharyngeal Comment --  CHL IP CERVICAL ESOPHAGEAL PHASE 01/23/2016 Cervical Esophageal Phase Impaired Pudding Teaspoon -- Pudding Cup -- Honey Teaspoon -- Honey Cup -- Nectar Teaspoon -- Nectar Cup -- Nectar Straw -- Thin Teaspoon --  Thin Cup -- Thin Straw -- Puree -- Mechanical Soft -- Regular -- Multi-consistency -- Pill -- Cervical Esophageal Comment -- CHL IP GO 08/04/2015 Functional Assessment Tool Used ASHA NOMS and clinical judgment.   Functional Limitations Swallowing Swallow Current Status [phone]) CM Swallow Goal Status (V6160) CK Swallow Discharge Status (V3710) (None) Motor Speech Current Status [phone]) (None) Motor Speech Goal Status [phone]) (None) Motor Speech Goal Status [phone]) (None) Spoken Language Comprehension Current Status [phone]) (None) Spoken Language Comprehension Goal Status (W2993) (None) Spoken Language Comprehension Discharge Status [phone]) (None) Spoken Language Expression Current Status [phone]) (None) Spoken Language Expression Goal Status [phone]) (None) Spoken Language Expression Discharge Status [phone]) (None) Attention Current Status (N2778) (None) Attention Goal Status (E4235) (None) Attention Discharge Status [phone]) (None) Memory Current Status (R1540) (None) Memory Goal Status (G8676) (None) Memory Discharge Status (P9509) (None) Voice Current Status (T2671) (None) Voice Goal Status (I4580) (None) Voice Discharge Status (D9833) (None) Other Speech-Language Pathology Functional Limitation [phone]) (None) Other Speech-Language Pathology Functional Limitation Goal  Status [phone]) (None) Other Speech-Language Pathology Functional Limitation Discharge Status [phone]) (None) DeBlois, Katherene Ponto 01/23/2016, 3:16 PM                   Scheduled Meds: . ARIPiprazole  5 mg Oral Daily  . atorvastatin  20 mg Oral q1800  . dabigatran  150 mg Oral Q12H  . diltiazem  240 mg Oral Daily  . divalproex  1,000 mg Oral QHS  . DULoxetine  120 mg Oral Daily  . ipratropium-albuterol  3 mL Nebulization Q6H  . methylPREDNISolone (SOLU-MEDROL) injection  60 mg Intravenous Q12H  . metoprolol  50 mg Oral BID  . montelukast  10 mg Oral QHS  . piperacillin-tazobactam (ZOSYN)  IV  3.375 g Intravenous Q8H  . primidone  250  mg Oral BID  . senna-docusate  1 tablet Oral BID   Continuous Infusions:   LOS: 2 days    Time spent: 93 mins    Lashandra Arauz, MD [facility] Pager [phone]  If 7PM-7AM, please contact night-coverage [email or web address] Password [email or web address] 01/23/2016, 4:18 PM

## 2016-01-23 NOTE — Progress Notes (Signed)
Initial Nutrition Assessment  DOCUMENTATION CODES:   Not applicable  INTERVENTION:  Downgraded diet to Dysphagia 1 as patient reports he is usually on a pureed diet at home.   Recommend SLP evaluation as patient also reports he is typically on thin liquids.  Provide Magic cup TID with meals, each supplement provides 290 kcal and 9 grams of protein. Can provide Ensure for patient if he is downgraded to thin liquids.  NUTRITION DIAGNOSIS:   Increased nutrient needs related to cancer and cancer related treatments, catabolic illness (COPD, bronchiectasis, lung cancer) as evidenced by estimated needs.  GOAL:   Patient will meet greater than or equal to 90% of their needs  MONITOR:   PO intake, Supplement acceptance, Labs, Weight trends, I & O's  REASON FOR ASSESSMENT:   Malnutrition Screening Tool, Consult Assessment of nutrition requirement/status  ASSESSMENT:   70 y.o. 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 Diovan, CVA combine systolic diastolic CHF chronic who presented with 3 days of worsening fatigue worsening shortness of breath cough with dark mucus associated wheezing.   -Noted consult for Palliative Medicine to discuss goals of care with patient.  Spoke with patient at bedside. He reports his appetite is "okay" and that he is still eating the same as at home. He reports having 3 meals per day (usually pureed [place] or chicken with gravy and sides) and 1 Ensure Plus per day. Patient reports he is on the wrong diet here as he is usually on pureed foods (dysphagia 1) with thin liquids. Patient reports he has had the difficulty swallowing for a few years now. Per chart he has denied the offer of a G-tube in the past that was  offered in setting of dysphagia. Denies N/V, abdominal pain, constipation/diarrhea.   Patient reports UBW of 160 lbs and that his weight fluctuates. Per chart patient was at lowest recent weight of 153 lbs in 08/2015 and has since been regaining.   Medications reviewed and include: methylprednisolone 60 mg Q12hrs, Zosyn, senna, vancomycin.  Labs reviewed: Chloride 98, Glucose 151.  Nutrition-Focused physical exam completed. Findings are no fat depletion, mild muscle depletion, and no edema.   Discussed with RN. Recommending SLP evaluation as patient reports he is on pureed diet (dysphagia 1) with thin liquids at home.   Diet Order:  DIET - DYS 1 Room service appropriate? Yes; Fluid consistency: Honey Thick  Skin:  Reviewed, no issues  Last BM:  01/22/2016  Height:   Ht Readings from Last 1 Encounters:  01/22/16 '5\' 9"'$  (1.753 m)    Weight:   Wt Readings from Last 1 Encounters:  01/22/16 169 lb 5 oz (76.8 kg)    Ideal Body Weight:  72.73 kg  BMI:  Body mass index is 25 kg/m.  Estimated Nutritional Needs:   Kcal:  1920-2150 (25-28 kcal/kg)  Protein:  92-108 grams (1.2-1.4 grams/kg)  Fluid:  >/= 1.9 L/day (25 ml/kg)  EDUCATION NEEDS:   Education needs addressed (Encouraged ongoing intake of adequate protein and calories with meals to prevent patient from losing weight again.)  Willey Blade, MS, RD, LDN Pager: [phone] After Hours Pager: [phone]

## 2016-01-23 NOTE — Progress Notes (Signed)
OT Cancellation Note  Patient Details Name: Malik Zuniga MRN: [id number] DOB: Sep 19, 1945   Cancelled Treatment:    Reason Eval/Treat Not Completed: Patient at procedure or test/ unavailable  Lyniah Fujita 01/23/2016, 2:13 PM  Lesle Chris, OTR/L [phone] 01/23/2016

## 2016-01-23 NOTE — Progress Notes (Signed)
[facility] PCCM  S: Feels better today  O:  Vitals:   01/22/16 2114 01/22/16 2122 01/23/16 0532 01/23/16 0855  BP: 116/73  130/66   Pulse: 88  76   Resp: 20  18   Temp: 98.1 F (36.7 C)  98.4 F (36.9 C)   TempSrc: Oral  Oral   SpO2: 93% 93% 95% 96%  Weight:      Height:       RA  Gen: chronically ill appearing HENT: OP clear, TM's clear, neck supple PULM: Few crackles R base, normal effort CV: RRR, no mgr, trace edema GI: BS+, soft, nontender Derm: no cyanosis or rash Psyche: normal mood and affect  CBC    Component Value Date/Time   WBC 14.0 (H) 01/23/2016 0543   RBC 4.25 01/23/2016 0543   HGB 12.6 (L) 01/23/2016 0543   HCT 37.5 (L) 01/23/2016 0543   PLT 231 01/23/2016 0543   PLT 329 12/10/2009   MCV 88.2 01/23/2016 0543   MCH 29.6 01/23/2016 0543   MCHC 33.6 01/23/2016 0543   RDW 16.5 (H) 01/23/2016 0543   LYMPHSABS 1.0 01/23/2016 0543   MONOABS 0.3 01/23/2016 0543   EOSABS 0.0 01/23/2016 0543   BASOSABS 0.0 01/23/2016 0543   BMET    Component Value Date/Time   NA 135 01/23/2016 0543   K 4.1 01/23/2016 0543   CL 98 (L) 01/23/2016 0543   CO2 29 01/23/2016 0543   GLUCOSE 151 (H) 01/23/2016 0543   GLUCOSE 88 12/10/2009   BUN 19 01/23/2016 0543   CREATININE 0.76 01/23/2016 0543   CREATININE 0.65 10/11/2013 1125   CALCIUM 8.9 01/23/2016 0543   GFRNONAA >60 01/23/2016 0543   GFRNONAA >89 11/24/2011 1213   GFRAA >60 01/23/2016 0543   GFRAA >89 11/24/2011 1213   Impression: Acute on chronic respiratory failure with hypoxemia Aspiration of food  Dysphagia Chronic bronchiectasis  Discussion: Malik Zuniga is better and back to baseline.  He notes non-compliance with his vest at home.  Today he and I had a long conversation regarding his overall condition. He is currently not interested in hospice despite the severity of his chronic respiratory failure with hypoxemia with repeated hospitalizations.   I explained to him at length today that his condition is due to  chronic aspiration which would not be fixed with any sort of PEG tube. Further, if he wants to try to stay out of the hospital he needs to be compliant with his therapies which include using DuoNeb 4 times a day, therapy vest twice a day, and follow speech therapy guidelines. However, if he doesn't want to do this he needs to understand the consequences are respiratory failure and death. I'm willing to support him either way. At this time he wishes to pursue an approach of being more compliant with therapy.  Plan: Therapy vest twice a day DuoNeb 4 times a day Which changed to Augmentin, treat for another 5 days Prednisone taper over 7 days down to baseline dose of 5 mg daily Okay to discharge from my standpoint   Roselie Awkward, MD [place] [facility] Pager: [phone] Cell: [phone] After 3pm or if no response, call [phone]

## 2016-01-23 NOTE — Evaluation (Signed)
Physical Therapy Evaluation Patient Details Name: Malik Zuniga MRN: [id number] DOB: October 18, 1945 Today's Date: 01/23/2016   History of Present Illness  70 yo male admitted with acute exacerbation of bronchiectasis. hx of COPD, recurrent aspiration, chronic LBP, adenocarcinoma, Afib, CHF, HTN, CVA, bipolar d/o  Clinical Impression  On eval, pt was Min assist for mobility. He walked ~100 feet with a RW. Unsteady at times requiring external assist to prevent fall. He tolerated distance well. O2 sats 93% on RA (left [place] off-made RN aware). Discussed d/c plan-pt states he will return home. He politely declined HHPT follow up. Recommend 24 hour supervision/assist.     Follow Up Recommendations Supervision/Assistance - 24 hour (pt declined HHPT when suggested during session)    Equipment Recommendations  None recommended by PT    Recommendations for Other Services       Precautions / Restrictions Precautions Precautions: Fall Restrictions Weight Bearing Restrictions: No      Mobility  Bed Mobility Overal bed mobility: Needs Assistance Bed Mobility: Supine to Sit     Supine to sit: Supervision     General bed mobility comments: for safety  Transfers Overall transfer level: Needs assistance Equipment used: Rolling walker (2 wheeled) Transfers: Sit to/from Stand Sit to Stand: Min assist         General transfer comment: Assist to rise, stabilize, control descent. LOB x1 posteriorly requiring external assist to correct  Ambulation/Gait Ambulation/Gait assistance: Min assist Ambulation Distance (Feet): 100 Feet Assistive device: Rolling walker (2 wheeled) Gait Pattern/deviations: Step-through pattern;Decreased stride length;Trunk flexed     General Gait Details: Significantlty flexed neck and trunk posture. Assist to stabilize pt, especially during turns/changes in direction or step backwards. O2 sats 93% on RA.  Stairs            Wheelchair Mobility    Modified  Rankin (Stroke Patients Only)       Balance Overall balance assessment: Needs assistance         Standing balance support: Bilateral upper extremity supported Standing balance-Leahy Scale: Poor                               Pertinent Vitals/Pain Pain Assessment: No/denies pain    Home Living Family/patient expects to be discharged to:: Private residence Living Arrangements: Spouse/significant other Available Help at Discharge: Family Type of Home: House Home Access: Stairs to enter Entrance Stairs-Rails: Right Entrance Stairs-Number of Steps: 5 Home Layout: One level Home Equipment: Environmental consultant - 2 wheels;Walker - 4 wheels;Cane - single point;Bedside commode;Grab bars - tub/shower;Tub bench;Wheelchair - manual      Prior Function Level of Independence: Needs assistance   Gait / Transfers Assistance Needed: Uses WC in the community w/ wife pushing.  Ambualtes using RW.  Uses cane when going up steps into home forward, descends sideways.           Hand Dominance   Dominant Hand: Right    Extremity/Trunk Assessment   Upper Extremity Assessment Upper Extremity Assessment: Generalized weakness    Lower Extremity Assessment Lower Extremity Assessment: Generalized weakness    Cervical / Trunk Assessment Cervical / Trunk Assessment: Kyphotic  Communication      Cognition Arousal/Alertness: Awake/alert Behavior During Therapy: WFL for tasks assessed/performed Overall Cognitive Status: Within Functional Limits for tasks assessed                      General Comments  Exercises     Assessment/Plan    PT Assessment Patient needs continued PT services  PT Problem List Decreased strength;Decreased mobility;Decreased balance;Decreased activity tolerance;Decreased knowledge of use of DME          PT Treatment Interventions DME instruction;Gait training;Therapeutic activities;Therapeutic exercise;Patient/family education;Functional  mobility training;Balance training    PT Goals (Current goals can be found in the Care Plan section)  Acute Rehab PT Goals Patient Stated Goal: home soon PT Goal Formulation: With patient Time For Goal Achievement: 02/06/16 Potential to Achieve Goals: Fair    Frequency Min 3X/week   Barriers to discharge        Co-evaluation               End of Session Equipment Utilized During Treatment: Gait belt Activity Tolerance: Patient tolerated treatment well Patient left: in chair;with call bell/phone within reach;with family/visitor present;with chair alarm set           Time: 9604-5409 PT Time Calculation (min) (ACUTE ONLY): 15 min   Charges:   PT Evaluation $PT Eval Low Complexity: 1 Procedure     PT G Codes:        Weston Anna, MPT Pager: [phone]

## 2016-01-23 NOTE — Telephone Encounter (Signed)
Spoke with patient daughter-in-law Malik Zuniga, states that she would like Dr [name] or a nurse to call them tomorrow or as soon as possible to discuss patients care. Pt is admitted to [facility] and the family does not really know what is going on with his health. Pt is very confused all the time and the physicians are not really communicating with anyone other than the patient (per DIL). Per daughter-in-law she does not feel that the patient is in a good state to be going home tomorrow and feels that he needs to stay in the hospital a little longer as there is no one there at home that can efficiently care for the patient. Pt's wife is not doing well right now and cannot care for her husband alone at this time. Plans are to D/C home on 01/24/16 and the family wants Dr [name] to call them tomorrow (01/24/16) to discuss what is going on with his care. There has been mention of Palliative Care and the family is not sure why this is being brought in. Per the DPR on file the only people that can be spoken with are the patient's wife Malik Zuniga and Malik Zuniga. Daughter in law is aware she is not on the DPR so his care cannot be discussed with her unfortunately - expressed understanding and asks that someone call the patient's Son Malik Zuniga.  Dr [name] please advise if you are able to visit the patient and discuss with the family in the morning as you are rounding at [facility].  Patient DIL Malik Zuniga requests that you call the patient's Son Malik Zuniga if not able to come by and see the patient.

## 2016-01-23 NOTE — Consult Note (Signed)
Consultation Note Date: 01/23/2016   Patient Name: Malik Zuniga  DOB: [birth year]  MRN: [id number]  Age / Sex: 70 y.o., male  PCP: [name], MD Referring Physician: Eugenie Filler, MD  Reason for Consultation: Establishing goals of care  HPI/Patient Profile: 70 y.o. 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 Diovan, CVA combine systolic diastolic CHF chronic Presented with to 3 days of worsening fatigue worsening shortness of breath cough with dark mucus associated wheezing no fevers nausea vomiting no chest pain his family has called to primary care provider is very concerned.. EMS arrived to home on patient to have extensive wheezing is given nebulizer treatment and to Solu-Medrol placed on CPAP On arrival to emergency department required 5 L with oxygen saturation only up to 86% Patient has recurrent admissions for aspiration pneumonia is been followed by pulmonology for COPD and bronchiectasis currently using a therapy vest he is on chronic steroids for his COPD/bronchiectasis. PCCM Consulted, with final recommendations to have the patient commit to therapy vest BID, duoneb treatments, steroid taper, antibiotics etc. Initial discussions held between the patient and Dr [name] from pulmonary service regarding the serious nature of the patient's lung conditions and the possibility of him needing hospice type care soon. A palliative consult has hence been requested to continue goals of care discussions.   The patient is resting in bed, he feels  much better than when he came in. He managed to get some rest overnight. There is no family present at the bedside, he is awaiting a breathing treatment. I introduced myself and palliative care as follows: Palliative medicine is specialized medical care for people living with serious illness. It focuses on providing relief from the symptoms and stress of a serious illness. The goal is to improve quality of life for both the patient and the family.  The patient states that he used to be a Psychologist, occupational at [employer], he states that the mention of words such as "hospice" or "palliative" immediately send him into a state of panic. Discussed with him that this meeting would only be about having some conversations. Gently discussed with patient that he is facing serious life limiting illnesses, ongoing functional decline, recurrent hospitalizations etc. Discussed about hospice services, detailed information given about how hospice can help. Advanced directives and code status discussions also undertaken, all questions answered to the best of my ability, see below, thank you for the consult.    NEXT OF KIN  wife  SUMMARY OF RECOMMENDATIONS    1. Code status discussions undertaken. Discussed with patient in detail about the full scope of a resuscitative attempt including CPR, ACLS medications, intubation/mechanical ventilation etc. Alternatively, discussed with him in detail about what DNR  DNI would entail, that it does not mean, do not treat, discussed about comfort measures that can be provided at end of life.   2. Goals of care discussions with regards to his chronic aspiration discussed in detail, the patient had a PEG placed in the past, he states that he hated it, and he had it removed. How ever, patient initially stated that he would consider a PEG tube now, "if it came to life or death." Extensive discussions undertaken with patient about ongoing aspiration risk even with PEG, bed bound status, skin  breakdown etc. Also discussed about not having the comfort from self careful feeding. Hence, now, he does not wish to proceed with PEG placement.   3.  Disposition options discussed, patient would like to return home towards the end of this hospitalization. 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 Gently introduced the concept of Hospice, how hospice services can help him and his family, initially in the home setting. Patient will continue to ponder and discuss with his family regarding our conversations. In the meantime, he is agreeable to home based palliative care on discharge.   Code Status/Advance Care Planning:  Full code    Symptom Management:     As above  Palliative Prophylaxis:   Bowel Regimen  Additional Recommendations (Limitations, Scope, Preferences):  Full Scope Treatment  Psycho-social/Spiritual:   Desire for further Chaplaincy support:no  Additional Recommendations: Caregiving  Support/Resources  Prognosis:   Guarded.   Discharge Planning: Home with Home Health, recommend out patient palliative care.       Primary Diagnoses: Present on Admission: . Acute exacerbation of bronchiectasis ([place]) . Bipolar disorder ([place]) . CAD (coronary artery disease) . Cancer of lower lobe of right lung ([place]) . Chronic lower back pain . Chronic respiratory failure with hypoxia ([place]) . Essential hypertension . Paroxysmal atrial fibrillation (HCC) . Aspiration pneumonia ([place]) . Pneumonia   I have reviewed the medical record, interviewed the patient and family, and examined the patient. The following aspects are pertinent.  Past Medical History:  Diagnosis Date  . Anemia associated with acute blood loss   . Angioedema 10/31/2010  . Anxiety    takes Valium daily  . Aspiration pneumonia ([place]) 2010  . Asthma   . Bacteremia due to Pseudomonas 10/04/2011   D/t gram neg rods   . Bipolar affective disorder ([place])   . C V A  / STROKE 02/20/2010   Qualifier: Diagnosis of  By: Charma Igo    . Chronic back pain    compression fracture  . Constipation    takes Colace daily as well as Miralax  . Coronary artery disease   . Depression    takes Cymbalta daily  . Emphysema of lung (HCC)    Albuterol as needed;Symbicort daily and Singulair at bedtime  . GERD (gastroesophageal reflux disease)    takes Omeprazole daily  . Headache, chronic daily   . History of bronchitis   . History of colon polyps   . History of migraine    last migraine a couple of days ago;takes Excedrin Migraine  . History of prostate cancer 2004  . Hyperlipidemia    takes Simvastatin daily  . Hypertension    takes Metoprolol daily as well as Hyzaar  . Insomnia    takes Benadryl nightly  . Lung cancer ([place]) 2016  . Mood change ([place])    after Brain surgery mood changed and was placed on Depakote  .  Myocardial infarction 04/1998  . Neuromuscular disorder ([place]) 1998   right carpal tunnel release  . On home O2   . Shortness of breath    with exertion  . Stroke [facility]) 1998   Brain Aneurysm  . Tremor    Social History   Social History  . Marital status: Married    Spouse name: N/A  . Number of children: N/A  . Years of education: N/A   Social History Main Topics  . Smoking status: Former Smoker    Packs/day: 1.50    Years: 52.00    Types: Cigarettes    Quit date: 07/17/2012  . Smokeless tobacco: Never Used     Comment: Former smoker  . Alcohol use No  . Drug use: No  . Sexual activity: No   Other Topics Concern  . None   Social History Narrative  . None   Family History  Problem Relation Age of Onset  . Heart disease Mother   . Cancer Father     brain cancer and prostate cancer    Scheduled Meds: . ARIPiprazole  5 mg Oral Daily  . atorvastatin  20 mg Oral q1800  . dabigatran  150 mg Oral Q12H  . diltiazem  240 mg Oral Daily  . divalproex  1,000 mg Oral QHS  . DULoxetine  120 mg Oral Daily  .  ipratropium-albuterol  3 mL Nebulization Q6H  . methylPREDNISolone (SOLU-MEDROL) injection  60 mg Intravenous Q12H  . metoprolol  50 mg Oral BID  . montelukast  10 mg Oral QHS  . piperacillin-tazobactam (ZOSYN)  IV  3.375 g Intravenous Q8H  . primidone  250 mg Oral BID  . senna-docusate  1 tablet Oral BID   Continuous Infusions: PRN Meds:.albuterol, diazepam, HYDROcodone-acetaminophen Medications Prior to Admission:  Prior to Admission medications   Medication Sig Start Date End Date Taking? Authorizing Provider  ARIPiprazole (ABILIFY) 10 MG tablet Take 5 mg by mouth daily.  05/09/14  Yes Historical Provider, MD  Ascorbic Acid (VITAMIN C) 1000 MG tablet Take 1,000 mg by mouth daily.   Yes Historical Provider, MD  atorvastatin (LIPITOR) 20 MG tablet Take 1 tablet (20 mg total) by mouth daily at 6 PM. 09/11/15  Yes Brittainy M Simmons, PA-C  budesonide (PULMICORT) 0.25 MG/2ML nebulizer solution Take 4 mLs (0.5 mg total) by nebulization 2 (two) times daily. 12/07/13  Yes Kathee Delton, MD  cholecalciferol (VITAMIN D) 1000 UNITS tablet Take 1,000 Units by mouth every morning.    Yes Historical Provider, MD  dabigatran (PRADAXA) 150 MG CAPS capsule Take 1 capsule (150 mg total) by mouth every 12 (twelve) hours. 11/01/15  Yes Belva Crome, MD  diazepam (VALIUM) 5 MG tablet Take 1 tablet (5 mg total) by mouth every 8 (eight) hours as needed for anxiety or muscle spasms. scheduled 06/29/15  Yes Donita Brooks, NP  divalproex (DEPAKOTE) 500 MG 24 hr tablet Take 1,000 mg by mouth at bedtime.    Yes Historical Provider, MD  docusate sodium (COLACE) 100 MG capsule Take 200 mg by mouth at bedtime.    Yes Historical Provider, MD  DULoxetine (CYMBALTA) 60 MG capsule Take 120 mg by mouth daily.    Yes Historical Provider, MD  fluticasone (FLONASE) 50 MCG/ACT nasal spray PLACE 2 SPRAYS INTO BOTH NOSTRILS DAILY. Patient taking differently: PLACE 2 SPRAYS INTO BOTH NOSTRILS DAILY as needed for allergies 09/17/15   Yes [name], MD  HYDROcodone-acetaminophen [facility]) 10-325 MG tablet Take 1-2 tablets  by mouth every 4 (four) hours as needed for pain. 01/07/16  Yes Historical Provider, MD  ipratropium-albuterol (DUONEB) 0.5-2.5 (3) MG/3ML SOLN Take 3 mLs by nebulization every 6 (six) hours. 12/07/13  Yes Kathee Delton, MD  metoprolol (LOPRESSOR) 50 MG tablet Take 1 tablet (50 mg total) by mouth 2 (two) times daily. 09/20/15  Yes Belva Crome, MD  montelukast (SINGULAIR) 10 MG tablet TAKE 1 TABLET AT BEDTIME 10/31/15  Yes [name], MD  nitroGLYCERIN (NITROSTAT) 0.4 MG SL tablet PLACE 1 TABLET (0.4 MG TOTAL) UNDER THE TONGUE EVERY 5 (FIVE) MINUTES AS NEEDED FOR CHEST PAIN. 08/06/15  Yes Belva Crome, MD  omeprazole (PRILOSEC) 40 MG capsule Take 40 mg by mouth 2 (two) times daily.    Yes Historical Provider, MD  predniSONE (DELTASONE) 10 MG tablet 4 tabs x 2 days, then 3 tabs x2 days, 2 tabs x2 days, 1 tab x2 days and then 5 mg daily Patient taking differently: Take 10 mg by mouth every morning.  01/09/16  Yes [name], MD  primidone (MYSOLINE) 250 MG tablet TAKE 1 TABLET TWICE DAILY Patient taking differently: TAKE '250mg'$  TABLET by mouth TWICE DAILY 10/31/15  Yes [name], MD  Zinc 50 MG TABS Take 50 mg by mouth daily.    Yes Historical Provider, MD  diltiazem (CARDIZEM CD) 240 MG 24 hr capsule TAKE 1 CAPSULE EVERY DAY 01/21/16   Belva Crome, MD  feeding supplement, ENSURE ENLIVE, (ENSURE ENLIVE) LIQD Take 237 mLs by mouth 2 (two) times daily between meals. 08/09/15   Modena Jansky, MD  guaiFENesin (ROBITUSSIN) 100 MG/5ML SOLN Take 5 mLs (100 mg total) by mouth every 4 (four) hours as needed for cough or to loosen phlegm. Patient not taking: Reported on 01/21/2016 08/09/15   Modena Jansky, MD  ranitidine (ZANTAC) 150 MG tablet Take 150-300 mg by mouth 2 (two) times daily as needed for heartburn.    Historical Provider, MD  tiZANidine (ZANAFLEX) 4 MG tablet TAKE 1 TABLET BY  MOUTH EVERY 8 HOURS AS NEEDED FOR MUSCLE SPASMS. Patient not taking: Reported on 01/21/2016 01/09/15   [name], MD   Allergies  Allergen Reactions  . Lisinopril Swelling    ANGIOEDEMA  . Lamictal [Lamotrigine] Other (See Comments)    Weakness/difficulty swallowing  . Ambien [Zolpidem] Other (See Comments)    Unknown reaction   Review of Systems Dyspnea controlled this morning Denies pain  Physical Exam Appears chronically ill NAD S1 S2 Abdomen soft Few crackles base, no significant wheezes appreciated anteriorly No edema  Vital Signs: BP 130/66 (BP Location: Right Arm)   Pulse 76   Temp 98.4 F (36.9 C) (Oral)   Resp 18   Ht '5\' 9"'$  (1.753 m)   Wt 76.8 kg (169 lb 5 oz)   SpO2 96%   BMI 25.00 kg/m  Pain Assessment: 0-10   Pain Score: 0-No pain   SpO2: SpO2: 96 % O2 Device:SpO2: 96 % O2 Flow Rate: .O2 Flow Rate (L/min): 2.5 L/min  IO: Intake/output summary:   Intake/Output Summary (Last 24 hours) at 01/23/16 1414 Last data filed at 01/23/16 1327  Gross per 24 hour  Intake             1890 ml  Output              500 ml  Net             1390 ml    LBM:  Last BM Date: 01/22/16 Baseline Weight: Weight: 74.8 kg (165 lb) Most recent weight: Weight: 76.8 kg (169 lb 5 oz)     Palliative Assessment/Data:   Flowsheet Rows   Flowsheet Row Most Recent Value  Intake Tab  Referral Department  Hospitalist  Unit at Time of Referral  Med/Surg Unit  Palliative Care Primary Diagnosis  Pulmonary  Date Notified  01/22/16  Palliative Care Type  New Palliative care  Reason for referral  Non-pain Symptom, Clarify Goals of Care, Counsel Regarding Hospice  Date of Admission  01/21/16  Date first seen by Palliative Care  01/23/16  # of days IP prior to Palliative referral  1  Clinical Assessment  Palliative Performance Scale Score  30%  Pain Max last 24 hours  4  Pain Min Last 24 hours  3  Dyspnea Max Last 24 Hours  7  Dyspnea Min Last 24 hours  4  Nausea Max  Last 24 Hours  3  Nausea Min Last 24 Hours  2  Anxiety Max Last 24 Hours  4  Anxiety Min Last 24 Hours  3  Psychosocial & Spiritual Assessment  Palliative Care Outcomes  Patient/Family meeting held?  Yes  Who was at the meeting?  patient, who is decisional.   Palliative Care Outcomes  Clarified goals of care      Time In: 9 Time Out: 10 Time Total: 60 min  Greater than 50%  of this time was spent counseling and coordinating care related to the above assessment and plan.  Signed by: Loistine Chance, MD  [phone]  Please contact Palliative Medicine Team phone at [phone] for questions and concerns.  For individual provider: See Shea Evans

## 2016-01-24 LAB — CBC
HEMATOCRIT: 39.4 % (ref 39.0–52.0)
Hemoglobin: 13.2 g/dL (ref 13.0–17.0)
MCH: 29.7 pg (ref 26.0–34.0)
MCHC: 33.5 g/dL (ref 30.0–36.0)
MCV: 88.5 fL (ref 78.0–100.0)
Platelets: 238 10*3/uL (ref 150–400)
RBC: 4.45 MIL/uL (ref 4.22–5.81)
RDW: 16.6 % — AB (ref 11.5–15.5)
WBC: 13.5 10*3/uL — ABNORMAL HIGH (ref 4.0–10.5)

## 2016-01-24 LAB — BASIC METABOLIC PANEL
Anion gap: 8 (ref 5–15)
BUN: 20 mg/dL (ref 6–20)
CALCIUM: 9.3 mg/dL (ref 8.9–10.3)
CO2: 30 mmol/L (ref 22–32)
CREATININE: 0.62 mg/dL (ref 0.61–1.24)
Chloride: 100 mmol/L — ABNORMAL LOW (ref 101–111)
GFR calc Af Amer: >60 mL/min (ref >60)
GFR calc non Af Amer: >60 mL/min (ref >60)
GLUCOSE: 130 mg/dL — AB (ref 65–99)
Potassium: 4.7 mmol/L (ref 3.5–5.1)
Sodium: 138 mmol/L (ref 135–145)

## 2016-01-24 MED ORDER — PREDNISONE 10 MG PO TABS: 10.0000 mg | ORAL_TABLET | Freq: Every day | ORAL | 0 refills | Status: DC

## 2016-01-24 MED ORDER — ALUM & MAG HYDROXIDE-SIMETH 200-200-20 MG/5ML PO SUSP
30.0000 mL | Freq: Four times a day (QID) | ORAL | Status: DC | PRN
  Administered 2016-01-24: 30 mL via ORAL
  Filled 2016-01-24: qty 30

## 2016-01-24 MED ORDER — SENNOSIDES-DOCUSATE SODIUM 8.6-50 MG PO TABS: 1.0000 | ORAL_TABLET | Freq: Two times a day (BID) | ORAL | Status: DC

## 2016-01-24 MED ORDER — AMOXICILLIN-POT CLAVULANATE 875-125 MG PO TABS: 1.0000 | ORAL_TABLET | Freq: Two times a day (BID) | ORAL | 0 refills | Status: AC

## 2016-01-24 MED ORDER — CYCLOBENZAPRINE HCL 5 MG PO TABS: 5.0000 mg | ORAL_TABLET | Freq: Three times a day (TID) | ORAL | 0 refills | Status: DC | PRN

## 2016-01-24 MED ORDER — RESOURCE THICKENUP CLEAR PO POWD: 1.0000 g | ORAL | 3 refills | Status: DC | PRN

## 2016-01-24 NOTE — Telephone Encounter (Signed)
I had a lengthy conversation with the son today updating him about his father's condition.

## 2016-01-24 NOTE — Progress Notes (Addendum)
This nurse attempted to call son, Ronalee Belts, to update him. He did not answer, thus this Rn left a message. This RN also called his wife, Anderson Malta, and updated her on the plan to discharge home today, however, she expressed that she is still concerned he is not stable enough to be home and would like him to stay another night.   UPDATE:  The patient's daughter-in-law returned this RN's call. This RN updated her on the patient's status, with the patient's verbal consent given. Items discussed include the patient is now on room air, he has been switched from IV to PO antibiotics, and his WBC are improving. His lung still sound "junky" meaning rhonchi can be auscultated. The daughter-in-law also wanted some more explanation of COPD and palliative care. This RN gave a gross overview of both of these, however, she would also like an update from the attending MD and Palliative MD if she is not present at the bedside at time of rounding.

## 2016-01-24 NOTE — Telephone Encounter (Signed)
Will close encounter

## 2016-01-24 NOTE — Evaluation (Signed)
Occupational Therapy Evaluation Patient Details Name: Malik Zuniga MRN: [id number] DOB: [birth year] Today's Date: 01/24/2016    History of Present Illness 70 yo male admitted with acute exacerbation of bronchiectasis. hx of COPD, recurrent aspiration, chronic LBP, adenocarcinoma, Afib, CHF, HTN, CVA, bipolar d/o   Clinical Impression   Pt admitted with the above diagnosis and has the deficits outlined below. Pt is set to be d/c'd today therefore recommending HHOT to attempt to maximize and maintain pt's independence with adls at home in areas of bathing, dressing, toileting and grooming.      Follow Up Recommendations  Home health OT;Supervision/Assistance - 24 hour    Equipment Recommendations  None recommended by OT    Recommendations for Other Services       Precautions / Restrictions Precautions Precautions: Fall Precaution Comments: Pt states he falls twice a month Restrictions Weight Bearing Restrictions: No      Mobility Bed Mobility Overal bed mobility: Needs Assistance Bed Mobility: Supine to Sit;Sit to Supine     Supine to sit: Supervision Sit to supine: Supervision   General bed mobility comments: for safety  Transfers Overall transfer level: Needs assistance Equipment used: Rolling walker (2 wheeled) Transfers: Sit to/from Stand Sit to Stand: Min guard         General transfer comment: close guard for safety. VCs safety    Balance Overall balance assessment: Needs assistance Sitting-balance support: Feet supported Sitting balance-Leahy Scale: Fair     Standing balance support: Bilateral upper extremity supported Standing balance-Leahy Scale: Poor Standing balance comment: Feed pt needs outside support anytime he is standing. Pt states he falls about twice a month and can get himself back up.  REminded him to have his walker with him at all times.                            ADL Overall ADL's : Needs  assistance/impaired Eating/Feeding: Set up;Sitting Eating/Feeding Details (indicate cue type and reason): supervision for swallowing Grooming: Set up;Sitting   Upper Body Bathing: Set up;Sitting   Lower Body Bathing: Min guard;Sit to/from stand   Upper Body Dressing : Set up;Sitting   Lower Body Dressing: Min guard;Sit to/from stand   Toilet Transfer: Min guard;RW;Comfort height toilet;Grab bars;Ambulation   Toileting- Clothing Manipulation and Hygiene: Supervision/safety;Sitting/lateral lean       Functional mobility during ADLs: Min guard;Rolling walker General ADL Comments: Pt does very well with adls all things considered. Pt can reach feet. Min guard only needed when standing for safety and this being first visit with this therapist.  Pt gets SOB easily and needs rest breaks but desires to keep his independence with adls.      Vision Vision Assessment?: No apparent visual deficits   Perception     Praxis      Pertinent Vitals/Pain Pain Assessment: No/denies pain     Hand Dominance Right   Extremity/Trunk Assessment Upper Extremity Assessment Upper Extremity Assessment: Generalized weakness   Lower Extremity Assessment Lower Extremity Assessment: Defer to PT evaluation   Cervical / Trunk Assessment Cervical / Trunk Assessment: Kyphotic   Communication     Cognition Arousal/Alertness: Awake/alert Behavior During Therapy: WFL for tasks assessed/performed Overall Cognitive Status: Within Functional Limits for tasks assessed                     General Comments       Exercises       Shoulder  Instructions      Home Living Family/patient expects to be discharged to:: Private residence Living Arrangements: Spouse/significant other Available Help at Discharge: Family Type of Home: House Home Access: Stairs to enter Technical brewer of Steps: 5 Entrance Stairs-Rails: Right Home Layout: One level     Bathroom Shower/Tub: Tub/shower  unit Shower/tub characteristics: Curtain Biochemist, clinical: [place]: Environmental consultant - 2 wheels;Walker - 4 wheels;Cane - single point;Bedside commode;Grab bars - tub/shower;Tub bench;Wheelchair - manual          Prior Functioning/Environment Level of Independence: Needs assistance  Gait / Transfers Assistance Needed: Uses WC in the community w/ wife pushing.  Ambualtes using RW.  Uses cane when going up steps into home forward, descends sideways. ADL's / Homemaking Assistance Needed: Pt states he dresses himself and bathes self after set up.  Daughter in law did not agree.  Pts wife does all homecare and cooking/driving.            OT Problem List: Decreased activity tolerance;Decreased strength;Decreased safety awareness;Impaired balance (sitting and/or standing);Cardiopulmonary status limiting activity   OT Treatment/Interventions: Self-care/ADL training;Therapeutic activities;DME and/or AE instruction    OT Goals(Current goals can be found in the care plan section) Acute Rehab OT Goals Patient Stated Goal: home soon OT Goal Formulation: All assessment and education complete, DC therapy  OT Frequency:     Barriers to D/C:            Co-evaluation              End of Session Equipment Utilized During Treatment: Rolling walker Nurse Communication: Mobility status  Activity Tolerance: Patient limited by fatigue Patient left: in bed;with call bell/phone within reach;with family/visitor present   Time: 4008-6761 OT Time Calculation (min): 25 min Charges:  OT General Charges $OT Visit: 1 Procedure OT Evaluation $OT Eval Moderate Complexity: 1 Procedure OT Treatments $Self Care/Home Management : 8-22 mins G-Codes:    Glenford Peers 02-09-2016, 12:52 PM [phone]

## 2016-01-24 NOTE — Discharge Summary (Signed)
Physician Discharge Summary  Malik Zuniga DOB: 1945-10-16 DOA: 01/21/2016  PCP: [name], MD  Admit date: 01/21/2016 Discharge date: 01/24/2016  Time spent: 65 minutes  Recommendations for Outpatient Follow-up:  1. Follow-up with Rexene Edison., NP pulmonary on 01/28/2016.   Discharge Diagnoses:  Principal Problem:   Acute exacerbation of bronchiectasis ([place]) Active Problems:   Aspiration pneumonia ([place])   Bipolar disorder ([place])   Essential hypertension   Chronic lower back pain   CAD (coronary artery disease)   Cancer of lower lobe of right lung (HCC)   Chronic respiratory failure with hypoxia (HCC)   Paroxysmal atrial fibrillation (HCC)   Chronic anticoagulation   Pneumonia   COPD exacerbation ([place])   Discharge Condition: Stable and improved.  Diet recommendation: Dysphagia 1 diet with nectar thick liquids, aspiration precautions.  Filed Weights   01/21/16 1550 01/22/16 1559  Weight: 74.8 kg (165 lb) 76.8 kg (169 lb 5 oz)    History of present illness:  Per Dr. Herminio Commons Malik Zuniga is a 70 y.o. 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 Diovan, CVA combine systolic diastolic CHF chronic    Presented with 3 days of worsening fatigue worsening shortness of breath cough with dark mucus associated wheezing no fevers nausea vomiting no chest pain his family has called to primary care provider is very concerned.. EMS arrived to home on patient to have extensive wheezing is given nebulizer treatment and to Solu-Medrol placed on  CPAP  On arrival to emergency department required 5 L with oxygen saturation only up to 86% Patient has had recurrent admissions for aspiration pneumonia is been followed  by pulmonology for COPD and bronchiectasis currently using a therapy vest he is on chronic steroids for his COPD/bronchiectasis Regarding pertinent Chronic problems: Patient had recurrent admission for aspiration pneumonia and sepsis during one of his hospitalizations he was found to have a flutter with RVR and was able converted to sinus he was started on pyridoxine and diltiazem and metoprolol IN ER:  Temp (24hrs), Avg:97.4 F (36.3 C), Min:97.4 F (36.3 C), Max:97.4 F (36.3 C)  Hospital Course:  #1 acute on chronic respiratory failure with hypoxia likely secondary to aspiration pneumonia and acute exacerbation of bronchiectasis. Patient was admitted CT of the chest which was obtained was negative for PE however did show debris within the trachea, and partially obscuring bronchi compatible with aspiration. Bibasilar atelectasis is no focal consultation. Pulmonary and critical care medicine was consulted and recommended continuation of empiric antibiotics of IV vancomycin and IV Zosyn, chest physiotherapy every 8 hours, IV Solu-Medrol and bronchodilators as well as recommended a palliative care consult. Patient improved clinically and as such IV vancomycin discontinued and patient maintained on IV Zosyn. Patient was transitioned to oral Augmentin and tapered IV Solu-Medrol to oral prednisone taper back to his home dose of 5 mg daily. Patient was also maintained on chest physiotherapy. Patient will need to follow-up with pulmonary in the outpatient setting.  #2 chronic atrial fibrillation CHA2DS2VASC 5 Continued on home regimen of metoprolol and diltiazem for rate control. Continued on PRADAXA for anticoagulation.   #3 bipolar disorder Stable. Continued on Abilify and Depakote.  #4 coronary artery disease Stable. Continued on home regimen of Lipitor, Pradaxa, Cardizem, Lopressor.  #5 chronic low back pain Stable.  Norco as needed.  #6 hypertension Stable. Continued on home regimen of  metoprolol and diltiazem.  #7 dysphagia Patient noted to be on pured diet prior to admission. Patient did not want any artificial means of feeding. Patient understood the risk of aspiration and was willing to accept it. Patient has been seen in consultation by speech therapy and patient underwent a modified barium swallow and recommended that patient be on dysphagia 2 diet with honey thick liquids. Outpatient follow-up with speech therapy.  #8 cancer of the right lower lobe lung stage I (diagnosed 2016) Outpatient follow-up with pulmonary and oncology.   Procedures:  CT angiogram chest 01/21/2016  Chest x-ray 01/21/2016  Modified barium swallow 01/23/2016  Consultations:  PCCM: Dr [name] 01/22/2016  Palliative care 01/23/2016   Discharge Exam: Vitals:   01/24/16 0445 01/24/16 0802  BP: (!) 148/88   Pulse: 82 87  Resp: 18 18  Temp: 98.2 F (36.8 C)     General: NAD Cardiovascular: RRR Respiratory: CTAB  Discharge Instructions   Discharge Instructions    Diet general    Complete by:  As directed    Dysphagia 1 diet with nectar thick liquids, aspiration precautions.   Increase activity slowly    Complete by:  As directed      Current Discharge Medication List    START taking these medications   Details  amoxicillin-clavulanate (AUGMENTIN) 875-125 MG tablet Take 1 tablet by mouth every 12 (twelve) hours. Take for 4 days then stop. Qty: 8 tablet, Refills: 0    cyclobenzaprine (FLEXERIL) 5 MG tablet Take 1 tablet (5 mg total) by mouth 3 (three) times daily as needed for muscle spasms. Qty: 20 tablet, Refills: 0    Maltodextrin-Xanthan Gum (RESOURCE THICKENUP CLEAR) POWD Take 1 g by mouth as needed. Qty: 10 Can, Refills: 3    senna-docusate (SENOKOT-S) 8.6-50 MG tablet Take 1 tablet by mouth 2 (two) times daily.      CONTINUE these medications which have CHANGED   Details  predniSONE (DELTASONE) 10 MG tablet Take 1-6 tablets (10-60 mg total) by mouth  daily before breakfast. Take 6 tablets ('60mg'$ ) daily x 2 days, then 4 tablets ('40mg'$ ) daily x 3 days, then 2 tablets ('20mg'$ ) daily x 3 days, then 1 tablet ('10mg'$ ) daily x 3 days then back to home dose of 5 mg daily. Qty: 33 tablet, Refills: 0      CONTINUE these medications which have NOT CHANGED   Details  ARIPiprazole (ABILIFY) 10 MG tablet Take 5 mg by mouth daily.     Ascorbic Acid (VITAMIN C) 1000 MG tablet Take 1,000 mg by mouth daily.    atorvastatin (LIPITOR) 20 MG tablet Take 1 tablet (20 mg total) by mouth daily at 6 PM. Qty: 30 tablet, Refills: 6    budesonide (PULMICORT) 0.25 MG/2ML nebulizer solution Take 4 mLs (0.5 mg total) by nebulization 2 (two) times daily. Qty: 60 mL, Refills: 3    cholecalciferol (VITAMIN D) 1000 UNITS tablet Take 1,000 Units by mouth every morning.     dabigatran (PRADAXA) 150 MG CAPS capsule Take 1 capsule (150 mg total) by mouth every 12 (twelve) hours. Qty: 60 capsule, Refills: 11    diazepam (VALIUM) 5 MG tablet Take 1 tablet (5 mg total) by mouth every 8 (eight) hours as needed for anxiety or muscle spasms. scheduled Qty: 30 tablet, Refills: 0    divalproex (DEPAKOTE) 500 MG 24 hr tablet Take 1,000 mg by mouth at bedtime.     docusate  sodium (COLACE) 100 MG capsule Take 200 mg by mouth at bedtime.     DULoxetine (CYMBALTA) 60 MG capsule Take 120 mg by mouth daily.     fluticasone (FLONASE) 50 MCG/ACT nasal spray PLACE 2 SPRAYS INTO BOTH NOSTRILS DAILY. Qty: 16 g, Refills: 2    HYDROcodone-acetaminophen (NORCO) 10-325 MG tablet Take 1-2 tablets by mouth every 4 (four) hours as needed for pain. Refills: 0    ipratropium-albuterol (DUONEB) 0.5-2.5 (3) MG/3ML SOLN Take 3 mLs by nebulization every 6 (six) hours. Qty: 360 mL, Refills: 3    metoprolol (LOPRESSOR) 50 MG tablet Take 1 tablet (50 mg total) by mouth 2 (two) times daily. Qty: 180 tablet, Refills: 3    montelukast (SINGULAIR) 10 MG tablet TAKE 1 TABLET AT BEDTIME Qty: 90 tablet,  Refills: 1    nitroGLYCERIN (NITROSTAT) 0.4 MG SL tablet PLACE 1 TABLET (0.4 MG TOTAL) UNDER THE TONGUE EVERY 5 (FIVE) MINUTES AS NEEDED FOR CHEST PAIN. Qty: 25 tablet, Refills: 2    omeprazole (PRILOSEC) 40 MG capsule Take 40 mg by mouth 2 (two) times daily.     primidone (MYSOLINE) 250 MG tablet TAKE 1 TABLET TWICE DAILY Qty: 180 tablet, Refills: 1    Zinc 50 MG TABS Take 50 mg by mouth daily.     diltiazem (CARDIZEM CD) 240 MG 24 hr capsule TAKE 1 CAPSULE EVERY DAY Qty: 30 capsule, Refills: 4    feeding supplement, ENSURE ENLIVE, (ENSURE ENLIVE) LIQD Take 237 mLs by mouth 2 (two) times daily between meals. Qty: 237 mL, Refills: 12    ranitidine (ZANTAC) 150 MG tablet Take 150-300 mg by mouth 2 (two) times daily as needed for heartburn.      STOP taking these medications     guaiFENesin (ROBITUSSIN) 100 MG/5ML SOLN      tiZANidine (ZANAFLEX) 4 MG tablet        Allergies  Allergen Reactions  . Lisinopril Swelling    ANGIOEDEMA  . Lamictal [Lamotrigine] Other (See Comments)    Weakness/difficulty swallowing  . Ambien [Zolpidem] Other (See Comments)    Unknown reaction   Follow-up Information    Rexene Edison, NP Follow up on 01/28/2016.   Specialty:  Pulmonary Disease Why:  Appt at 3:15 PM  Contact information: [address] [address] [phone]            The results of significant diagnostics from this hospitalization (including imaging, microbiology, ancillary and laboratory) are listed below for reference.    Significant Diagnostic Studies: Ct Angio Chest Pe W And/or Wo Contrast  Result Date: 01/21/2016 CLINICAL DATA:  Shortness of breath, at risk for aspiration pneumonia. History of COPD, neuro muscular disorder, prostate and lung cancer, stroke. EXAM: CT ANGIOGRAPHY CHEST WITH CONTRAST TECHNIQUE: Multidetector CT imaging of the chest was performed using the standard protocol during bolus administration of intravenous contrast.  Multiplanar CT image reconstructions and MIPs were obtained to evaluate the vascular anatomy. CONTRAST:  100 cc Isovue 370 COMPARISON:  Chest radiograph January 21, 2016 at 1557 hours and CT chest October 16, 2015. FINDINGS: CARDIOVASCULAR: Adequate contrast opacification of the pulmonary artery's. Main pulmonary artery is not enlarged. No pulmonary arterial filling defects to the level to the segmental branches. Heterogeneous subsegmental pulmonary artery contrast opacification attributed to respiratory motion. Heart size is normal, no right heart strain. Mild calcific atherosclerosis of the coronary arteries. No pericardial effusions. Thoracic aorta is normal course and caliber, mild calcific atherosclerosis. MEDIASTINUM/NODES: No lymphadenopathy by CT size criteria.  LUNGS/PLEURA: Secretions within knee lower trachea, bilateral bronchi and partial obscuration of lower lobe segmental and subsegmental bronchi. Bronchial wall thickening. Bibasilar bandlike densities without pleural effusion or focal consolidation. Improved aeration RIGHT middle lobe. UPPER ABDOMEN: Included view of the abdomen is unremarkable. MUSCULOSKELETAL: Subacute nondisplaced RIGHT posterior eighth rib fracture without demonstrable interval healing. Pectus excavatum. Old T8 moderate to severe compression fracture. T9 pedicle screw tracks. T10 through included lumbar spine posterior instrumentation. Focal T8-9 kyphosis and interbody fusion. Review of the MIP images confirms the above findings. IMPRESSION: No acute pulmonary embolism. Debris within the trachea, and partially obscuring the bronchi compatible with aspiration. Bibasilar atelectasis, no focal consolidation. Subacute nondisplaced RIGHT posterior eighth rib fracture. Electronically Signed   By: Elon Alas M.D.   On: 01/21/2016 19:51   Dg Chest Port 1 View  Result Date: 01/21/2016 CLINICAL DATA:  Shortness of breath and wheezing. EXAM: PORTABLE CHEST 1 VIEW COMPARISON:   None. FINDINGS: The heart size and mediastinal contours are within normal limits. Both lungs are clear. Previous thoracolumbar spinal fusion. IMPRESSION: No active disease. Electronically Signed   By: Lorriane Shire M.D.   On: 01/21/2016 16:20   Dg Swallowing Func-speech Pathology  Result Date: 01/23/2016 Objective Swallowing Evaluation: Type of Study: MBS-Modified Barium Swallow Study Patient Details Name: BARETT WHIDBEE MRN: [id number] Date of Birth: 24-Jul-1945 Today's Date: 01/23/2016 Time: SLP Start Time (ACUTE ONLY): 1347-SLP Stop Time (ACUTE ONLY): 1422 SLP Time Calculation (min) (ACUTE ONLY): 35 min Past Medical History: Past Medical History: Diagnosis Date . Anemia associated with acute blood loss  . Angioedema 10/31/2010 . Anxiety   takes Valium daily . Aspiration pneumonia ([place]) 2010 . Asthma  . Bacteremia due to Pseudomonas 10/04/2011  D/t gram neg rods  . Bipolar affective disorder ([place])  . C V A / STROKE 02/20/2010  Qualifier: Diagnosis of  By: Charma Igo   . Chronic back pain   compression fracture . Constipation   takes Colace daily as well as Miralax . Coronary artery disease  . Depression   takes Cymbalta daily . Emphysema of lung (HCC)   Albuterol as needed;Symbicort daily and Singulair at bedtime . GERD (gastroesophageal reflux disease)   takes Omeprazole daily . Headache, chronic daily  . History of bronchitis  . History of colon polyps  . History of migraine   last migraine a couple of days ago;takes Excedrin Migraine . History of prostate cancer 2004 . Hyperlipidemia   takes Simvastatin daily . Hypertension   takes Metoprolol daily as well as Hyzaar . Insomnia   takes Benadryl nightly . Lung cancer ([place]) 2016 . Mood change ([place])   after Brain surgery mood changed and was placed on Depakote . Myocardial infarction 04/1998 . Neuromuscular disorder ([place]) 1998  right carpal tunnel release . On home O2  . Shortness of breath   with exertion . 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 Tremor  Past  Surgical History: Past Surgical History: Procedure Laterality Date . BACK SURGERY  08/2004; 02/2005; 04/2006; 06/2007; 7/20101  all by Dr. Annette Stable . BACK SURGERY  05/2013 . BRAIN SURGERY  1999  clip aneurysm . [place]  right . CHOLECYSTECTOMY  1996 . COLONOSCOPY   . CORONARY ANGIOPLASTY WITH STENT PLACEMENT  2000  RCA stent, Dr. Rollene Fare; denies stent for PAD 06/15/13 . CORONARY ANGIOPLASTY WITH STENT PLACEMENT  04/1998 . CRANIOTOMY  1999  to clip aneurism, Dr. Annette Stable . DG BIOPSY LUNG   . ESOPHAGOGASTRODUODENOSCOPY N/A 07/23/2012  Procedure:  ESOPHAGOGASTRODUODENOSCOPY (EGD);  Surgeon: Jeryl Columbia, MD;  Location: [facility] ENDOSCOPY;  Service: Endoscopy;  Laterality: N/A;  buccini /ja . GASTROSTOMY W/ FEEDING TUBE  2008  Dr. Watt Climes; only had for a few months . HAND SURGERY  1989  crushed thumb, Dr. Fredna Dow . PROSTATECTOMY  04/2001  removal of prostate cancer, Dr. Janice Norrie . SPINE SURGERY   . ULNAR NERVE REPAIR  1998  left arm, Dr. Fredna Dow HPI: Pt is a 70 year old male with a history of  COPD, bronchiectasis, adenocarcinomna and aspiration pna. He has had mulitple swallow evaluations in the past that show a mild dysphagia that would not necessarily account for the recurrent aspiration pna he has suffered from. 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 He and his wife feel he tolerates pureed solids best. He has consumed thin liquids up this point, though MD has put him on honey thick liquids. He is consulting with palliative care, but is still a full code and reports he wants to live. He also reports he does not want a PEG and wants to drink thin liquids, but would agree to thick liquids if they would prevent aspiration.  No Data Recorded Assessment / Plan / Recommendation CHL IP CLINICAL IMPRESSIONS 01/23/2016 Therapy Diagnosis Severe pharyngeal phase dysphagia;Moderate pharyngeal phase dysphagia Clinical Impression Pt demonstrates a moderate to severe oropharyngeal dysphagia primarily  due to structural changes of the oropharynx due to curvature of cervical spine. Swallow is relatively timely, but pharyngeal peristalsis and opening of the cricopharyngeal is insufficient to fully transit the bolus. The curvature of the spine is concave, leaving a wide pharynx, impacting the function of the hyolaryngeal mechanism for bolus propulsion. After the swallow there are moderate to severe residuals in the valleculae, pyriforms and pharyngeal ears that are silently penetrated/aspirated post swallow with thin liquids. Small sips and a chin tuck do improve the quantity of aspiration with thin liquids, but do not fully prevent aspiration. Given the severity of this pts recurrent pna and his preference to prevent aspiration as much as possible, recommend nectar thick liquids with two swallows to clear residuals. Pt also observed to masticate and transit solids well with two swallows. Pt primarily eats purees due to his feeling that he gets choked with solids. Suspect there may be an esophageal component with stasis with full meal amounts of solids. Encouraged pt to attempt small amounts of soft solids for comfort rather than exclusively eating purees. Also returned to pts room and educated pts daughter in law on precautions and instructions for thickening as pt may d/c home tomorrow. Discussed the potential for a water protocol in the future if pt does not tolerate thickened liquids. Daughter in law repeated instructions. Recommend f/u with Home Health to reinforce precautions and strategies.  Impact on safety and function Severe aspiration risk;Moderate aspiration risk   CHL IP TREATMENT RECOMMENDATION 01/23/2016 Treatment Recommendations Therapy as outlined in treatment plan below   Prognosis 01/23/2016 Prognosis for Safe Diet Advancement Guarded Barriers to Reach Goals Severity of deficits Barriers/Prognosis Comment -- CHL IP DIET RECOMMENDATION 01/23/2016 SLP Diet Recommendations Dysphagia 2 (Fine chop)  solids;Nectar thick liquid Liquid Administration via Cup Medication Administration Whole meds with puree Compensations Slow rate;Small sips/bites;Follow solids with liquid Postural Changes --   CHL IP OTHER RECOMMENDATIONS 01/23/2016 Recommended Consults -- Oral Care Recommendations Oral care BID Other Recommendations Order thickener from pharmacy   CHL IP FOLLOW UP RECOMMENDATIONS 01/23/2016 Follow up Recommendations Home health SLP  CHL IP FREQUENCY AND DURATION 01/23/2016 Speech Therapy Frequency (ACUTE ONLY) min 2x/week Treatment Duration 2 weeks      CHL IP ORAL PHASE 01/23/2016 Oral Phase WFL Oral - Pudding Teaspoon -- Oral - Pudding Cup -- Oral - Honey Teaspoon -- Oral - Honey Cup -- Oral - Nectar Teaspoon -- Oral - Nectar Cup -- Oral - Nectar Straw -- Oral - Thin Teaspoon -- Oral - Thin Cup -- Oral - Thin Straw -- Oral - Puree -- Oral - Mech Soft -- Oral - Regular -- Oral - Multi-Consistency -- Oral - Pill -- Oral Phase - Comment --  CHL IP PHARYNGEAL PHASE 01/23/2016 Pharyngeal Phase Impaired Pharyngeal- Pudding Teaspoon -- Pharyngeal -- Pharyngeal- Pudding Cup -- Pharyngeal -- Pharyngeal- Honey Teaspoon -- Pharyngeal -- Pharyngeal- Honey Cup -- Pharyngeal -- Pharyngeal- Nectar Teaspoon -- Pharyngeal -- Pharyngeal- Nectar Cup Reduced anterior laryngeal mobility;Pharyngeal residue - valleculae;Pharyngeal residue - pyriform;Compensatory strategies attempted (with notebox);Reduced pharyngeal peristalsis Pharyngeal -- Pharyngeal- Nectar Straw -- Pharyngeal -- Pharyngeal- Thin Teaspoon -- Pharyngeal -- Pharyngeal- Thin Cup Reduced pharyngeal peristalsis;Reduced anterior laryngeal mobility;Penetration/Apiration after swallow;Significant aspiration (Amount);Pharyngeal residue - valleculae;Pharyngeal residue - pyriform;Compensatory strategies attempted (with notebox) Pharyngeal -- Pharyngeal- Thin Straw -- Pharyngeal -- Pharyngeal- Puree Reduced anterior laryngeal mobility;Pharyngeal residue -  valleculae;Pharyngeal residue - pyriform;Compensatory strategies attempted (with notebox);Reduced pharyngeal peristalsis Pharyngeal -- Pharyngeal- Mechanical Soft Reduced anterior laryngeal mobility;Pharyngeal residue - valleculae;Pharyngeal residue - pyriform;Compensatory strategies attempted (with notebox);Reduced pharyngeal peristalsis Pharyngeal -- Pharyngeal- Regular -- Pharyngeal -- Pharyngeal- Multi-consistency -- Pharyngeal -- Pharyngeal- Pill -- Pharyngeal -- Pharyngeal Comment --  CHL IP CERVICAL ESOPHAGEAL PHASE 01/23/2016 Cervical Esophageal Phase Impaired Pudding Teaspoon -- Pudding Cup -- Honey Teaspoon -- Honey Cup -- Nectar Teaspoon -- Nectar Cup -- Nectar Straw -- Thin Teaspoon -- Thin Cup -- Thin Straw -- Puree -- Mechanical Soft -- Regular -- Multi-consistency -- Pill -- Cervical Esophageal Comment -- CHL IP GO 08/04/2015 Functional Assessment Tool Used ASHA NOMS and clinical judgment.   Functional Limitations Swallowing Swallow Current Status [id number]) CM Swallow Goal Status (H0865) CK Swallow Discharge Status (H8469) (None) Motor Speech Current Status [phone]) (None) Motor Speech Goal Status [phone]) (None) Motor Speech Goal Status [phone]) (None) Spoken Language Comprehension Current Status [phone]) (None) Spoken Language Comprehension Goal Status (G6440) (None) Spoken Language Comprehension Discharge Status [phone]) (None) Spoken Language Expression Current Status [phone]) (None) Spoken Language Expression Goal Status [id number]) (None) Spoken Language Expression Discharge Status [phone]) (None) Attention Current Status (J8841) (None) Attention Goal Status (Y6063) (None) Attention Discharge Status [phone]) (None) Memory Current Status (U9323) (None) Memory Goal Status (F5732) (None) Memory Discharge Status (K0254) (None) Voice Current Status (Y7062) (None) Voice Goal Status (B7628) (None) Voice Discharge Status [phone]) (None) Other Speech-Language Pathology Functional Limitation [phone]) (None) Other  Speech-Language Pathology Functional Limitation Goal Status (P7106) (None) Other Speech-Language Pathology Functional Limitation Discharge Status [phone]) (None) DeBlois, Katherene Ponto 01/23/2016, 3:16 PM               Microbiology: No results found for this or any previous visit (from the past 240 hour(s)).   Labs: Basic Metabolic Panel:  Recent Labs Lab 01/21/16 1601 01/22/16 0436 01/23/16 0543 01/24/16 0538  NA 137 135 135 138  K 4.3 3.9 4.1 4.7  CL 98* 99* 98* 100*  CO2 '30 27 29 30  '$ GLUCOSE 103* 135* 151* 130*  BUN '14 16 19 20  '$ CREATININE 0.82 0.69 0.76 0.62  CALCIUM 9.4 8.9 8.9 9.3   Liver Function Tests:  Recent Labs Lab 01/21/16  1601 01/22/16 0436 01/23/16 0543  AST '25 22 19  '$ ALT '21 19 17  '$ ALKPHOS 46 41 33*  BILITOT 0.7 0.7 0.6  PROT 7.0 6.6 6.1*  ALBUMIN 4.1 3.6 3.2*   No results for input(s): LIPASE, AMYLASE in the last 168 hours. No results for input(s): AMMONIA in the last 168 hours. CBC:  Recent Labs Lab 01/21/16 1601 01/22/16 0436 01/23/16 0543 01/24/16 0538  WBC 9.5 12.0* 14.0* 13.5*  NEUTROABS 7.3 10.9* 12.7*  --   HGB 14.1 13.6 12.6* 13.2  HCT 42.9 40.5 37.5* 39.4  MCV 89.7 88.2 88.2 88.5  PLT 234 238 231 238   Cardiac Enzymes: No results for input(s): CKTOTAL, CKMB, CKMBINDEX, TROPONINI in the last 168 hours. BNP: BNP (last 3 results)  Recent Labs  06/25/15 1734 12/18/15 1149 01/21/16 1601  BNP 36.3 38.6 55.8    ProBNP (last 3 results) No results for input(s): PROBNP in the last 8760 hours.  CBG: No results for input(s): GLUCAP in the last 168 hours.     SignedIrine Seal MD.  [facility] 01/24/2016, 12:40 PM

## 2016-01-24 NOTE — Progress Notes (Signed)
Physical Therapy Treatment Patient Details Name: Malik Zuniga MRN: [id number] DOB: [birth year] Today's Date: 01/24/2016    History of Present Illness 70 yo male admitted with acute exacerbation of bronchiectasis. hx of COPD, recurrent aspiration, chronic LBP, adenocarcinoma, Afib, CHF, HTN, CVA, bipolar d/o    PT Comments    Progressing with mobility.   Follow Up Recommendations  Supervision/Assistance - 24 hour (HHPT if pt is agreeable. He declined HHPT follow up on yesterday.)     Equipment Recommendations  None recommended by PT    Recommendations for Other Services       Precautions / Restrictions Precautions Precautions: Fall Restrictions Weight Bearing Restrictions: No    Mobility  Bed Mobility Overal bed mobility: Needs Assistance Bed Mobility: Supine to Sit     Supine to sit: Supervision     General bed mobility comments: for safety  Transfers Overall transfer level: Needs assistance Equipment used: Rolling walker (2 wheeled) Transfers: Sit to/from Stand Sit to Stand: Min guard         General transfer comment: close guard for safety. VCs safety  Ambulation/Gait Ambulation/Gait assistance: Min guard Ambulation Distance (Feet): 100 Feet Assistive device: Rolling walker (2 wheeled) Gait Pattern/deviations: Step-through pattern;Decreased stride length;Trunk flexed     General Gait Details: Significantlty flexed neck and trunk posture. close guard for safety.    Stairs            Wheelchair Mobility    Modified Rankin (Stroke Patients Only)       Balance                                    Cognition Arousal/Alertness: Awake/alert Behavior During Therapy: WFL for tasks assessed/performed Overall Cognitive Status: Within Functional Limits for tasks assessed                      Exercises      General Comments        Pertinent Vitals/Pain Pain Assessment: No/denies pain    Home Living                       Prior Function            PT Goals (current goals can now be found in the care plan section) Progress towards PT goals: Progressing toward goals    Frequency    Min 3X/week      PT Plan Current plan remains appropriate    Co-evaluation             End of Session Equipment Utilized During Treatment: Gait belt Activity Tolerance: Patient tolerated treatment well Patient left: in chair;with call bell/phone within reach;with family/visitor present;with chair alarm set     Time: 1004-1015 PT Time Calculation (min) (ACUTE ONLY): 11 min  Charges:  $Gait Training: 8-22 mins                    G Codes:      Weston Anna, MPT Pager: [phone]

## 2016-01-24 NOTE — Progress Notes (Signed)
[facility] PCCM  S:  Pt reports he is ready to go home.  Denies acute complaints.  Daughter-in-law at bedside.  She has questions regarding palliative care, plan of care for discharge etc.  Pt on room air.  Daughter in law reports he does not get up much at home and sleeps a lot.   O:  Vitals:   01/23/16 2037 01/23/16 2129 01/24/16 0445 01/24/16 0802  BP: 131/68  (!) 148/88   Pulse: 91  82 87  Resp: '18  18 18  '$ Temp: 97.7 F (36.5 C)  98.2 F (36.8 C)   TempSrc: Axillary  Axillary   SpO2: 94% 92% 92% 90%  Weight:      Height:       RA  Gen: chronically ill appearing in NAD HENT: OP clear, TM's clear, neck supple PULM: even/non-labored, lungs bilaterally with occasional rhonchi CV: RRR, no mgr, trace edema GI: BS+, soft, nontender Derm: no cyanosis or rash Psyche: normal mood and affect  CBC    Component Value Date/Time   WBC 13.5 (H) 01/24/2016 0538   RBC 4.45 01/24/2016 0538   HGB 13.2 01/24/2016 0538   HCT 39.4 01/24/2016 0538   PLT 238 01/24/2016 0538   PLT 329 12/10/2009   MCV 88.5 01/24/2016 0538   MCH 29.7 01/24/2016 0538   MCHC 33.5 01/24/2016 0538   RDW 16.6 (H) 01/24/2016 0538   LYMPHSABS 1.0 01/23/2016 0543   MONOABS 0.3 01/23/2016 0543   EOSABS 0.0 01/23/2016 0543   BASOSABS 0.0 01/23/2016 0543   BMET    Component Value Date/Time   NA 138 01/24/2016 0538   K 4.7 01/24/2016 0538   CL 100 (L) 01/24/2016 0538   CO2 30 01/24/2016 0538   GLUCOSE 130 (H) 01/24/2016 0538   GLUCOSE 88 12/10/2009   BUN 20 01/24/2016 0538   CREATININE 0.62 01/24/2016 0538   CREATININE 0.65 10/11/2013 1125   CALCIUM 9.3 01/24/2016 0538   GFRNONAA >60 01/24/2016 0538   GFRNONAA >89 11/24/2011 1213   GFRAA >60 01/24/2016 0538   GFRAA >89 11/24/2011 1213    Impression: Acute on chronic respiratory failure with hypoxemia Aspiration of food  Dysphagia Chronic bronchiectasis  Discussion: Patient is improved, back to baseline.  He notes non-compliance with his vest at home.   We have discussed measures for success at home to include following aspiration precautions / dietary recommendations, adherence to chest vest, pulmonary hygiene, nebulization etc.   He is currently not interested in hospice despite the severity of his chronic respiratory failure with hypoxemia with repeated hospitalizations. He understands that his condition is due to chronic aspiration which would not be fixed with any sort of PEG tube.  However, if he doesn't want to do this he needs to understand the consequences are respiratory failure and death.  At this time he wishes to pursue an approach of being more compliant with therapy.  Plan: Therapy vest twice a day at home DuoNeb 4 times a day Which changed to Augmentin, treat for another 4 days Prednisone taper over 7 days down to baseline dose of 5 mg daily Okay to discharge at this point Have reviewed plan of care with daughter-in-law as well.  Informed her that if at any point they want to involve palliative care, they can initiate a call for assessment.  Follow up with pulmonary arranged   [name], NP-C [facility] Pgr: [phone] or if no answer [phone] 01/24/2016, 12:00 PM

## 2016-01-24 NOTE — Progress Notes (Signed)
Daily Progress Note   Patient Name: Malik Zuniga       Date: 01/24/2016 DOB: [birth year]  Age: 70 y.o. MRN#: [id number] Attending Physician: Eugenie Filler, MD Primary Care Physician: [name], MD Admit Date: 01/21/2016  Reason for Consultation/Follow-up: Establishing goals of care  Subjective:  patient is awake alert resting in bed, he is off supplemental O2, in no distress, eager to go home Daughter in law at bedside, see discussions below.   Length of Stay: 3  Current Medications: Scheduled Meds:  . amoxicillin-clavulanate  1 tablet Oral Q12H  . ARIPiprazole  5 mg Oral Daily  . atorvastatin  20 mg Oral q1800  . dabigatran  150 mg Oral Q12H  . diltiazem  240 mg Oral Daily  . divalproex  1,000 mg Oral QHS  . DULoxetine  120 mg Oral Daily  . ipratropium-albuterol  3 mL Nebulization Q6H  . metoprolol  50 mg Oral BID  . montelukast  10 mg Oral QHS  . predniSONE  60 mg Oral QAC breakfast  . primidone  250 mg Oral BID  . senna-docusate  1 tablet Oral BID    Continuous Infusions:   PRN Meds: albuterol, alum & mag hydroxide-simeth, cyclobenzaprine, diazepam, HYDROcodone-acetaminophen, RESOURCE THICKENUP CLEAR  Physical Exam         NAD S1 S2 No overt wheezes No edema Abdomen soft Awake alert oriented  Vital Signs: BP (!) 148/88 (BP Location: Right Arm)   Pulse 87   Temp 98.2 F (36.8 C) (Axillary)   Resp 18   Ht '5\' 9"'$  (1.753 m)   Wt 76.8 kg (169 lb 5 oz)   SpO2 90%   BMI 25.00 kg/m  SpO2: SpO2: 90 % O2 Device: O2 Device: Not Delivered O2 Flow Rate: O2 Flow Rate (L/min): 2.5 L/min  Intake/output summary:  Intake/Output Summary (Last 24 hours) at 01/24/16 1026 Last data filed at 01/24/16 0800  Gross per 24 hour  Intake              600 ml  Output               500 ml  Net              100 ml   LBM: Last BM Date: 01/23/16 Baseline Weight: Weight: 74.8 kg (165 lb)  Most recent weight: Weight: 76.8 kg (169 lb 5 oz)       Palliative Assessment/Data:    Flowsheet Rows   Flowsheet Row Most Recent Value  Intake Tab  Referral Department  Hospitalist  Unit at Time of Referral  Med/Surg Unit  Palliative Care Primary Diagnosis  Pulmonary  Date Notified  01/22/16  Palliative Care Type  New Palliative care  Reason for referral  Non-pain Symptom, Clarify Goals of Care, Counsel Regarding Hospice  Date of Admission  01/21/16  Date first seen by Palliative Care  01/23/16  # of days IP prior to Palliative referral  1  Clinical Assessment  Palliative Performance Scale Score  30%  Pain Max last 24 hours  4  Pain Min Last 24 hours  3  Dyspnea Max Last 24 Hours  7  Dyspnea Min Last 24 hours  4  Nausea Max Last 24 Hours  3  Nausea Min Last 24 Hours  2  Anxiety Max Last 24 Hours  4  Anxiety Min Last 24 Hours  3  Psychosocial & Spiritual Assessment  Palliative Care Outcomes  Patient/Family meeting held?  Yes  Who was at the meeting?  patient, who is decisional.   Palliative Care Outcomes  Clarified goals of care      Patient Active Problem List   Diagnosis Date Noted  . COPD exacerbation ([place])   . Pneumonia 01/22/2016  . Acute exacerbation of bronchiectasis ([place]) 12/18/2015  . Chronic anticoagulation 11/01/2015  . Moderate malnutrition ([place]) 08/07/2015  . Bronchiectasis ([place]) 08/07/2015  . Cerebrovascular disease, arteriosclerotic, post-stroke 08/07/2015  . Acute delirium 08/07/2015  . Chronic bilateral low back pain with bilateral sciatica   . Adjustment disorder with mixed anxiety and depressed mood   . Paroxysmal atrial fibrillation ([facility])   . Diplopia   . Tachypnea   . Hypokalemia   . Absolute anemia   . COPD (chronic obstructive pulmonary disease) ([place])   . Bronchiectasis without acute exacerbation ([place]) 08/02/2015  .  Atrial flutter with rapid ventricular response ([place]) 08/02/2015  . Aspiration pneumonia ([place]) 06/25/2015  . Tinea cruris 06/05/2015  . Sepsis ([place]) 04/01/2015  . Cancer of lower lobe of right lung ([place]) 04/01/2015  . Chronic respiratory failure with hypoxia ([place]) 04/01/2015  . Cough 03/08/2015  . Dysphagia 02/01/2015  . Primary cancer of right lower lobe of lung ([place]) 12/05/2014  . Recurrent aspiration pneumonia ([place]) 11/16/2013  . Toenail fungus 11/06/2013  . 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 CAD (coronary artery disease) 07/27/2013  . Thoracic compression fracture ([place]) 06/21/2013  . Thoracic spine fracture ([place]) 06/21/2013  . Giant comedone 11/04/2012  . Unspecified constipation 07/24/2012  . Hyponatremia 07/22/2012  . Chronic lower back pain 07/22/2012  . Transaminitis 07/22/2012  . Prostate cancer ([place]) 04/22/2012  . Anemia associated with acute blood loss   . 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 Falls frequently 07/31/2011  . COPD   01/14/2011  . Allergic rhinitis, seasonal 12/18/2010  . Neoplasm of uncertain behavior of skin 05/07/2010  . MYOCARDIAL INFARCTION 02/20/2010  . Hyperlipidemia 02/13/2010  . Bipolar disorder ([place]) 02/13/2010  . DEPRESSION 02/13/2010  . Essential hypertension 02/13/2010  . TREMOR 02/13/2010  . PROSTATE CANCER, HX OF 02/13/2010  . Hx of TIA (transient ischemic attack) and stroke 02/13/2010  . Chronic daily headache 02/13/2010    Palliative Care Assessment & Plan   Patient Profile:    Assessment: 70 yo male admitted with acute exacerbation of bronchiectasis. hx of COPD, recurrent aspiration, chronic LBP, adenocarcinoma,  Afib, CHF, HTN, CVA, bipolar d/o   Recommendations/Plan:  Family meeting: discussed with patient and his daughter in law Malik Zuniga present at the bedside. Patient recalls his last conversation with Dr [name], he wishes to be more compliant with his vest, his nebulizers, he will complete his stroid taper and his antibiotic, he  will follow up with Dr [name]. At this time, his goals are not congruent with hospice philosophy. He is how ever agreeable to home with home health care services. Discussed in detail with daughter in law, all questions answered to her satisfaction and to the best of my ability.   Goals of Care and Additional Recommendations:  Recommend patient receive some information about [facility] so that in the future, if he deems it appropriate, family may pursue a palliative consultation through [facility] in the home setting. Discussed with case management here.   Code Status:    Code Status Orders        Start     Ordered   01/21/16 2324  Full code  Continuous     01/21/16 2324    Code Status History    Date Active Date Inactive Code Status Order ID Comments User Context   12/18/2015 11:03 AM 12/20/2015  4:16 PM Full Code [id number]  Melvenia Needles, NP Inpatient   08/02/2015 11:23 PM 08/09/2015  6:56 PM Full Code [id number]  Karmen Bongo, MD Inpatient   06/25/2015  5:29 PM 06/29/2015  2:17 PM Full Code [id number]  Melvenia Needles, NP Inpatient   04/01/2015 11:05 PM 04/04/2015  2:02 PM Full Code [id number]  Verlee Monte, MD ED   10/23/2013  2:27 PM 10/27/2013  5:53 PM Full Code [id number]  Kelvin Cellar, MD Inpatient   10/16/2013  3:54 PM 10/17/2013  4:41 PM Full Code [id number]  Kinnie Feil, MD Inpatient   06/21/2013  3:00 PM 06/22/2013  3:20 PM Full Code [id number]  Charlie Pitter, MD Inpatient   07/22/2012  9:11 PM 07/24/2012  7:23 PM Full Code [id number]  Charlynne Cousins, MD Inpatient   09/29/2011  4:48 PM 10/06/2011  7:35 PM Full Code [id number]  Angela Adam, RN Inpatient    Advance Directive Documentation   Flowsheet Row Most Recent Value  Type of Advance Directive  Healthcare Power of Attorney, Living will  Pre-existing out of facility DNR order (yellow form or pink MOST form)  No data  "MOST" Form in Place?  No data       Prognosis:   < 12 months?  Discharge Planning:  Home with [place] was discussed with  Patient daughter in law Dr Grandville Silos case management  Thank you for allowing the Palliative Medicine Team to assist in the care of this patient.   Time In: 10 Time Out: 10.30 Total Time 30 Prolonged Time Billed  no       Greater than 50%  of this time was spent counseling and coordinating care related to the above assessment and plan.  Loistine Chance, MD [phone]  Please contact Palliative Medicine Team phone at [phone] for questions and concerns.

## 2016-01-25 ENCOUNTER — Other Ambulatory Visit: Payer: Self-pay | Admitting: *Deleted

## 2016-01-25 ENCOUNTER — Telehealth: Payer: Self-pay

## 2016-01-25 NOTE — Telephone Encounter (Signed)
Spoke with patient's son, Ronalee Belts.  Transition Care Management Follow-up Telephone Call   Date discharged? 01/24/16   How have you been since you were released from the hospital? "fine, he's being dad. He does get weak fast"   Do you understand why you were in the hospital? yes   Do you understand the discharge instructions? yes   Where were you discharged to? Home.   Items Reviewed:  Medications reviewed: no, son driving at time of call, unable to verify medications. Per son, only medication change is the addition of tapered Prednisone.   Allergies reviewed: no  Dietary changes reviewed: yes, thickener added to liquids.  Referrals reviewed: no new referral, appt with Pulmonology Monday, 01/28/16.    Functional Questionnaire:   Activities of Daily Living (ADLs):   He states they are independent in the following: none States they require assistance with the following: ambulation, bathing and hygiene, feeding, continence, grooming, toileting and dressing. Patient is requesting assistance from wife for dressing and bathing. Wife recently hospitalized for surgery. [facility] has contacted patient (spoke with wife), they will be visiting home next week.    Any transportation issues/concerns?: no   Any patient concerns? No.    Confirmed importance and date/time of follow-up visits scheduled yes, with pulmonology.  Provider Appointment booked with pulmonologist. Son to call PCP for follow up appointment. Declined scheduling at this time.   Confirmed with patient if condition begins to worsen call PCP or go to the ER.  Patient was given the office number and encouraged to call back with question or concerns.  : yes

## 2016-01-25 NOTE — Telephone Encounter (Signed)
LM requesting son (mike) to return call to complete TCM and schedule hospital follow up.

## 2016-01-25 NOTE — Patient Outreach (Addendum)
[facility]) Care Management  01/25/2016  Malik Zuniga March 22, 1945 [id number]   Member discharged from hospital yesterday, 12/14.  Call placed to restart transition of care program, no answer.  HIPAA compliant voice message left.  Will await call back.  If no call back, will follow up next week.   Update @ 1050:  Call received back from member's wife.  She confirms that member was discharged from hospital yesterday.  She state he is doing "ok" but expresses concern regarding her ability to care for him as she was recently discharged herself, also this week, after having surgery.  She state that her son and daughter in law have been very helpful with providing assistance for the past week, but state that they went back home today.  She report that they will be returning later this afternoon.  She state her daughter in law has been providing care during the day and her son during the night hours.  She is very tearful during conversation, stating "I just don't think that he understand that I can't do these things for him right now."  She state that she is not in pain, but she is very weak and not feeling well.  Prior to her surgery, she was member's primary caregiver and he heavily depend on her.  She state that she does have a couple from the church that will be coming to the home to provide assistance until her son/daughter in law are available.  Wife unable to provide detailed information regarding member's discharge instructions, stating that her son/daughter in law "took care of that."  She report that the does not think that any of his medications were changed, but can not say for sure.  She is aware that the member was advised to use thickener for liquids to decrease risk of aspiration.  Discussed member's compliance with management of health condition (using chest vest, prednisone, and nebulizers), she state that the member "was doing really well" until he went for an extended  period of time without the vest due to equipment issue (dog chewed through the cords).  She is aware that his health condition is serious, and it is imperative for him to follow orders.  She is unaware if home health was ordered.  Discussed alternative options to care member as well as herself when no family/friends are available.  She does not have a plan, and denies having the finances to pay out of pocket for assistance.  She is made aware that member has follow up appointment with pulmonologist on Monday.  She state that her son/daughter in law will provide transportation.  She report that the member may need a follow up appointment with primary MD within the next couple weeks.  She will have son contact primary MD office to inquire.  She does agree to home visit within the next 2 weeks.   Update @ 1130:  Call received from Maudie Mercury at primary MD office, requesting this care manager contact member's son regarding plan of care and [facility] involvement.  Cal placed to son (name is on [facility] consent), identity verified.  He confirms that he and his wife have been providing assistance since last week.  He state that his main concern at this time is to have someone be able to provide assistance to member for ADLs and assistance with compliance with nebulizer and chest vest.  He is also made aware that resource will have to be paid out of pocket.  He state that  this is not feasible and that he and his wife will continue to provide 24 hour care.  He confirms that the member did have home health ordered, stating "somebody came by yesterday when my wife was here."  He state they have been managing his medications, denies questions.  Patient was recently discharged from hospital and all medications have been reviewed.  He confirms that member will have transportation to follow up appointment on Monday.  This care manager will follow up next week after appointment.  Contact information for this care manager provided, advised to  contact with concerns/questions.    Valente Tage, [place], MSN [place] [phone]

## 2016-01-28 ENCOUNTER — Inpatient Hospital Stay: Payer: Medicare Other | Admitting: Adult Health

## 2016-01-29 ENCOUNTER — Other Ambulatory Visit: Payer: Self-pay | Admitting: *Deleted

## 2016-01-29 NOTE — Patient Outreach (Signed)
Malik Zuniga) Care Management  01/29/2016  Malik Zuniga 1945/07/21 [id number]   Call placed to member's son, no answer. HIPAA compliant voice message left.  Will await call back.  If no call back, will follow up within a week.  Malik Zuniga, [place], MSN [place] [phone]

## 2016-01-31 ENCOUNTER — Telehealth: Payer: Self-pay | Admitting: Pulmonary Disease

## 2016-01-31 MED ORDER — DOXYCYCLINE HYCLATE 100 MG PO TABS: 100.0000 mg | ORAL_TABLET | Freq: Two times a day (BID) | ORAL | 0 refills | Status: DC

## 2016-01-31 MED ORDER — PREDNISONE 10 MG PO TABS: 10.0000 mg | ORAL_TABLET | Freq: Every day | ORAL | 0 refills | Status: DC

## 2016-01-31 NOTE — Telephone Encounter (Signed)
Rx sent to preferred pharmacy. Pt aware and voiced his understanding. Nothing further needed.  

## 2016-01-31 NOTE — Telephone Encounter (Signed)
At last discharge he was taking augmentin and prednisone.  If he has finished these, then suggest doxycycline 100 mg, # 14, 1 twice daily                                                      And prednisone 10 mg, # 7, 1 daily

## 2016-01-31 NOTE — Telephone Encounter (Signed)
Called and spoke to pt. Pt states his SOB and cough have worsened since hospital discharge. Pt states he initially felt well when d/c but is now feelign worse. Pt c/o increase in SOB, prod cough with yellow mucus, increase in chest congestion. Pt denies f/c/s and CP/tightness. Pt is hesitant to go back to ED for possible admission again. Pt is requesting recs. Will send to DOD as Dr. [name] is unavailable.   Dr Annamaria Boots please advise. Thanks.   Allergies  Allergen Reactions  . Lisinopril Swelling    ANGIOEDEMA  . Lamictal [Lamotrigine] Other (See Comments)    Weakness/difficulty swallowing  . Ambien [Zolpidem] Other (See Comments)    Unknown reaction    Current Outpatient Prescriptions on File Prior to Visit  Medication Sig Dispense Refill  . ARIPiprazole (ABILIFY) 10 MG tablet Take 5 mg by mouth daily.     . Ascorbic Acid (VITAMIN C) 1000 MG tablet Take 1,000 mg by mouth daily.    Marland Kitchen atorvastatin (LIPITOR) 20 MG tablet Take 1 tablet (20 mg total) by mouth daily at 6 PM. 30 tablet 6  . budesonide (PULMICORT) 0.25 MG/2ML nebulizer solution Take 4 mLs (0.5 mg total) by nebulization 2 (two) times daily. 60 mL 3  . cholecalciferol (VITAMIN D) 1000 UNITS tablet Take 1,000 Units by mouth every morning.     . cyclobenzaprine (FLEXERIL) 5 MG tablet Take 1 tablet (5 mg total) by mouth 3 (three) times daily as needed for muscle spasms. 20 tablet 0  . dabigatran (PRADAXA) 150 MG CAPS capsule Take 1 capsule (150 mg total) by mouth every 12 (twelve) hours. 60 capsule 11  . diazepam (VALIUM) 5 MG tablet Take 1 tablet (5 mg total) by mouth every 8 (eight) hours as needed for anxiety or muscle spasms. scheduled 30 tablet 0  . diltiazem (CARDIZEM CD) 240 MG 24 hr capsule TAKE 1 CAPSULE EVERY DAY 30 capsule 4  . divalproex (DEPAKOTE) 500 MG 24 hr tablet Take 1,000 mg by mouth at bedtime.     . docusate sodium (COLACE) 100 MG capsule Take 200 mg by mouth at bedtime.     . DULoxetine (CYMBALTA) 60 MG capsule  Take 120 mg by mouth daily.     . feeding supplement, ENSURE ENLIVE, (ENSURE ENLIVE) LIQD Take 237 mLs by mouth 2 (two) times daily between meals. 237 mL 12  . fluticasone (FLONASE) 50 MCG/ACT nasal spray PLACE 2 SPRAYS INTO BOTH NOSTRILS DAILY. (Patient taking differently: PLACE 2 SPRAYS INTO BOTH NOSTRILS DAILY as needed for allergies) 16 g 2  . HYDROcodone-acetaminophen (NORCO) 10-325 MG tablet Take 1-2 tablets by mouth every 4 (four) hours as needed for pain.  0  . ipratropium-albuterol (DUONEB) 0.5-2.5 (3) MG/3ML SOLN Take 3 mLs by nebulization every 6 (six) hours. 360 mL 3  . Maltodextrin-Xanthan Gum (RESOURCE THICKENUP CLEAR) POWD Take 1 g by mouth as needed. 10 Can 3  . metoprolol (LOPRESSOR) 50 MG tablet Take 1 tablet (50 mg total) by mouth 2 (two) times daily. 180 tablet 3  . montelukast (SINGULAIR) 10 MG tablet TAKE 1 TABLET AT BEDTIME 90 tablet 1  . nitroGLYCERIN (NITROSTAT) 0.4 MG SL tablet PLACE 1 TABLET (0.4 MG TOTAL) UNDER THE TONGUE EVERY 5 (FIVE) MINUTES AS NEEDED FOR CHEST PAIN. 25 tablet 2  . omeprazole (PRILOSEC) 40 MG capsule Take 40 mg by mouth 2 (two) times daily.     . predniSONE (DELTASONE) 10 MG tablet Take 1-6 tablets (10-60 mg total) by mouth daily before breakfast.  Take 6 tablets ('60mg'$ ) daily x 2 days, then 4 tablets ('40mg'$ ) daily x 3 days, then 2 tablets ('20mg'$ ) daily x 3 days, then 1 tablet ('10mg'$ ) daily x 3 days then back to home dose of 5 mg daily. 33 tablet 0  . primidone (MYSOLINE) 250 MG tablet TAKE 1 TABLET TWICE DAILY (Patient taking differently: TAKE '250mg'$  TABLET by mouth TWICE DAILY) 180 tablet 1  . ranitidine (ZANTAC) 150 MG tablet Take 150-300 mg by mouth 2 (two) times daily as needed for heartburn.    . senna-docusate (SENOKOT-S) 8.6-50 MG tablet Take 1 tablet by mouth 2 (two) times daily.    . Zinc 50 MG TABS Take 50 mg by mouth daily.      No current facility-administered medications on file prior to visit.

## 2016-02-01 ENCOUNTER — Other Ambulatory Visit: Payer: Self-pay | Admitting: *Deleted

## 2016-02-01 NOTE — Patient Outreach (Signed)
[facility]) Care Management  02/01/2016  Malik Zuniga Apr 10, 1945 [id number]   Weekly transition of care call placed to son, no answer.  HIPAA compliant voice message left.  Call then placed to member's wife.  She report that she has been feeling better herself and therefore have been able to provide more care to the member.  She state that they still have the assistance of their son and daughter in law.  She state that member is better today, but that she called Dr. Anastasia Pall office yesterday to report that he wasn't feeling well.  She denies member had fever, but was having increased sputum production and shortness of breath.  Antibiotics called to pharmacy, reports taking as prescribed and feeling better.  She state that he is currently using his chest vest at this time.  Discussed recommendations of speech therapist for nectar thickened liquids, she reports compliance.  She state that he continues on a pureed diet, but does have some soft foods.  She denies home health PT, stating that she does not think that he is well enough to participate if it was ordered.  She is advised to consider requesting order from MD once member is feeling better and able to participate.  Wife state that she is partially aware of the member's conversation while inpatient regarding palliative care considering member's condition has not improved.  She state that as far as she know the member is still open to the concept.  She think the visit the son was referring to during the last conversation was related to palliative care, but will clarify with him.  She state that she will discuss again with member and if he is still open to a conversation, this care manager will place referral for palliative care.    She denies any other concerns, will follow up next week.  Home visit confirmed.  Valente Shayne, [place], MSN [place] [phone]

## 2016-02-06 ENCOUNTER — Telehealth: Payer: Self-pay | Admitting: Pulmonary Disease

## 2016-02-06 ENCOUNTER — Other Ambulatory Visit: Payer: Self-pay | Admitting: *Deleted

## 2016-02-06 MED ORDER — DOXYCYCLINE HYCLATE 100 MG PO TABS: 100.0000 mg | ORAL_TABLET | Freq: Two times a day (BID) | ORAL | 0 refills | Status: DC

## 2016-02-06 NOTE — Patient Outreach (Signed)
[facility]) Care Management   02/06/2016  Malik Zuniga 06-21-1945 [id number]  Malik Zuniga is an 71 y.o. male  Subjective:   Member report that he is "feeling about the same."  He state that he continue to have some congestion and increased sputum production.  He reports that he has been taking all medications as prescribed, including prednisone and antibiotics.  He state that he has a few more days of antibiotics left but feel that he may need them a little longer.  He report that once he is done he will contact Dr. [name] if he does not feel any better.  He complains of chronic pain, 4/10, but denies the need for pain medications.  Objective:   Review of Systems  Constitutional: Negative.   HENT: Positive for congestion.   Eyes: Negative.   Respiratory: Positive for cough and sputum production.   Cardiovascular: Negative.   Gastrointestinal: Negative.   Genitourinary: Negative.   Musculoskeletal: Positive for back pain and falls.  Skin: Negative.   Neurological: Negative.   Endo/Heme/Allergies: Negative.   Psychiatric/Behavioral: Negative.     Physical Exam  Constitutional: He is oriented to person, place, and time.  Neck: Normal range of motion.  Cardiovascular: Normal rate, regular rhythm and normal heart sounds.   Respiratory: He has rales.  GI: Soft. Bowel sounds are normal.  Musculoskeletal: Normal range of motion.  Neurological: He is alert and oriented to person, place, and time.  Skin: Skin is warm and dry.   BP 122/68   Pulse 77   Resp 20   SpO2 93%   Encounter Medications:   Outpatient Encounter Prescriptions as of 02/06/2016  Medication Sig  . ARIPiprazole (ABILIFY) 10 MG tablet Take 5 mg by mouth daily.   . Ascorbic Acid (VITAMIN C) 1000 MG tablet Take 1,000 mg by mouth daily.  Marland Kitchen atorvastatin (LIPITOR) 20 MG tablet Take 1 tablet (20 mg total) by mouth daily at 6 PM.  . budesonide (PULMICORT) 0.25 MG/2ML nebulizer solution Take 4  mLs (0.5 mg total) by nebulization 2 (two) times daily.  . cholecalciferol (VITAMIN D) 1000 UNITS tablet Take 1,000 Units by mouth every morning.   . cyclobenzaprine (FLEXERIL) 5 MG tablet Take 1 tablet (5 mg total) by mouth 3 (three) times daily as needed for muscle spasms.  . dabigatran (PRADAXA) 150 MG CAPS capsule Take 1 capsule (150 mg total) by mouth every 12 (twelve) hours.  . diazepam (VALIUM) 5 MG tablet Take 1 tablet (5 mg total) by mouth every 8 (eight) hours as needed for anxiety or muscle spasms. scheduled  . diltiazem (CARDIZEM CD) 240 MG 24 hr capsule TAKE 1 CAPSULE EVERY DAY  . divalproex (DEPAKOTE) 500 MG 24 hr tablet Take 1,000 mg by mouth at bedtime.   . docusate sodium (COLACE) 100 MG capsule Take 200 mg by mouth at bedtime.   Marland Kitchen doxycycline (VIBRA-TABS) 100 MG tablet Take 1 tablet (100 mg total) by mouth 2 (two) times daily.  . DULoxetine (CYMBALTA) 60 MG capsule Take 120 mg by mouth daily.   . feeding supplement, ENSURE ENLIVE, (ENSURE ENLIVE) LIQD Take 237 mLs by mouth 2 (two) times daily between meals.  . fluticasone (FLONASE) 50 MCG/ACT nasal spray PLACE 2 SPRAYS INTO BOTH NOSTRILS DAILY. (Patient taking differently: PLACE 2 SPRAYS INTO BOTH NOSTRILS DAILY as needed for allergies)  . HYDROcodone-acetaminophen (NORCO) 10-325 MG tablet Take 1-2 tablets by mouth every 4 (four) hours as needed for pain.  Marland Kitchen ipratropium-albuterol (DUONEB)  0.5-2.5 (3) MG/3ML SOLN Take 3 mLs by nebulization every 6 (six) hours.  . Maltodextrin-Xanthan Gum (RESOURCE THICKENUP CLEAR) POWD Take 1 g by mouth as needed.  . metoprolol (LOPRESSOR) 50 MG tablet Take 1 tablet (50 mg total) by mouth 2 (two) times daily.  . montelukast (SINGULAIR) 10 MG tablet TAKE 1 TABLET AT BEDTIME  . nitroGLYCERIN (NITROSTAT) 0.4 MG SL tablet PLACE 1 TABLET (0.4 MG TOTAL) UNDER THE TONGUE EVERY 5 (FIVE) MINUTES AS NEEDED FOR CHEST PAIN.  Marland Kitchen omeprazole (PRILOSEC) 40 MG capsule Take 40 mg by mouth 2 (two) times daily.   .  predniSONE (DELTASONE) 10 MG tablet Take 1 tablet (10 mg total) by mouth daily with breakfast.  . primidone (MYSOLINE) 250 MG tablet TAKE 1 TABLET TWICE DAILY (Patient taking differently: TAKE '250mg'$  TABLET by mouth TWICE DAILY)  . ranitidine (ZANTAC) 150 MG tablet Take 150-300 mg by mouth 2 (two) times daily as needed for heartburn.  . senna-docusate (SENOKOT-S) 8.6-50 MG tablet Take 1 tablet by mouth 2 (two) times daily.  . Zinc 50 MG TABS Take 50 mg by mouth daily.   . predniSONE (DELTASONE) 10 MG tablet Take 1-6 tablets (10-60 mg total) by mouth daily before breakfast. Take 6 tablets ('60mg'$ ) daily x 2 days, then 4 tablets ('40mg'$ ) daily x 3 days, then 2 tablets ('20mg'$ ) daily x 3 days, then 1 tablet ('10mg'$ ) daily x 3 days then back to home dose of 5 mg daily. (Patient not taking: Reported on 02/06/2016)   No facility-administered encounter medications on file as of 02/06/2016.     Functional Status:   In your present state of health, do you have any difficulty performing the following activities: 01/22/2016 01/21/2016  Hearing? N Y  Vision? N N  Difficulty concentrating or making decisions? N Y  Walking or climbing stairs? - Y  Dressing or bathing? N Y  Doing errands, shopping? Y Y  Preparing Food and eating ? - -  Using the Toilet? - -  In the past six months, have you accidently leaked urine? - -  Do you have problems with loss of bowel control? - -  Managing your Medications? - -  Managing your Finances? - -  Housekeeping or managing your Housekeeping? - -  Some recent data might be hidden    Fall/Depression Screening:    PHQ 2/9 Scores 01/09/2016 12/13/2015 11/12/2015 08/21/2015 06/07/2015 04/26/2015 03/30/2015  PHQ - 2 Score '1 1 1 3 '$ 0 4 1  PHQ- 9 Score - - - 15 - 17 -    Assessment:    Met with member at scheduled time, wife present during visit.  Member with rales/rhonci audible without auscultation.  He has not used his chest vest yet this morning, but state he will do so when this  visit is complete.  He is aware that his mobility is a concern, state that he feel he has gotten a little stronger since his discharge.  Discussed the possibility of having home health PT.  He denies the need for PT at home, prefer to go to outpatient rehab center as he had previously.  He is aware that his wife is not able to transport him and lift his wheelchair in/out of the car at this time.  He states he prefer to wait until her health has improved.  He state he will discuss with physician at that time.  He state that he has continued to have the support of his son and daughter in law when  needed.  Wife state that she does feel better and is able to do more, but is tired easily.  She denies the need for assistance, stating that she has people to help when needed.  Wife confirmed with son that there were no visitors to the home since discharge.  Discussed the conversation with Dr. [name] while in the hospital regarding palliative care services.  Member state that the topic was "explained different" and that he understood palliative care better.  This care manager inquired about member changing his mind about being involved in a program as no order was placed prior to discharge.  He state that he did not change his mind and that he was still interested in services that may be offered.  Difference between hospice and palliative care again explained as wife was not available for initial conversation.  He is open to referral to hear about potential services.  They both are aware that he is not obligated to be involved, but benefits explained.    Both member and wife deny any other questions at this time.  Provided again with contact information for this care manager, encouraged to contact with concerns.  Plan:   Will place referral to [facility]. Will follow up next week with weekly transition of care call.  [facility] CM Care Plan Problem One   Flowsheet Row Most Recent Value  Care Plan Problem One   Risk for readmission related to chronic bronchiectasis as evidenced by recent hospital admission  Role Documenting the Problem One  Care Management [place] for Problem One  Active  [facility]N Long Term Goal (31-90 days)  Member will not be readmitted to hospital within the next 31 days  THN Long Term Goal Start Date  01/25/16  Interventions for Problem One Long Term Goal  Discussed with member the importance of following discharge instructions, including follow up appointments, medications, diet, to decrease the risk of readmission  [facility] CM Short Term Goal #1 (0-30 days)  Member will report using pulmonary therapy vest as instructed, twice a day, over the next 4 weeks  THN CM Short Term Goal #1 Start Date  01/25/16  Interventions for Short Term Goal #1  Educated wife & member on importance of using vest as instructed, as vest help with secretions.  Confirmed with wife that member has all parts for machine and vest is in good working condition  [facility]N CM Short Term Goal #2 (0-30 days)  Member will keep and attend follow up appointment with pulmonary MD within the next 4 weeks  THN CM Short Term Goal #2 Start Date  01/25/16  Interventions for Short Term Goal #2  Re-educated family on importance of follow up appointments as it aides in decreasing risk for readmission.  Advised to call to reschedule appointment as soon as possible       Valente Pheonix, Therapist, sports, MSN [place] Manager [phone]

## 2016-02-06 NOTE — Telephone Encounter (Signed)
rx sent to preferred pharmacy.  Pt's wife (dpr on file) aware.  Nothing further needed.

## 2016-02-06 NOTE — Telephone Encounter (Signed)
Spoke with the pt  He is requesting a refill on Doxy 100 mg bid  We called in med on 12.21.17 when he called in sick, and he states his cough is much improved  He is coughing less and sputum is lighter in color  He only has 2 doses left and wants refill sent  Please advise, thanks!

## 2016-02-06 NOTE — Telephone Encounter (Signed)
Fine with me

## 2016-02-14 ENCOUNTER — Other Ambulatory Visit: Payer: Self-pay | Admitting: *Deleted

## 2016-02-14 NOTE — Patient Outreach (Signed)
[name] Care Management  02/14/2016  Malik Zuniga 12-01-1945 [id number]   Weekly transition of care call placed to member and wife.  Member continues to have congestion even with a new round of antibiotics (called MD for refill last week), but state that he is slowly getting better.  He report that he has continued to use his nebulizer and chest vest as instructed.  He has follow up appointment with pulmonology at the end of the month, previous follow up was canceled and not rescheduled.  Wife report that he has a few more days of the new round of antibiotics and will contact MD office if member does not improve for an earlier appointment.  She state that the member does not want to be readmitted to hospital, but know that it is a possibility if he does not improve or becomes worse.    Wife aware that referral was placed to [facility] and that member is under review.  She expresses gratitude and verbalizes understanding.  She does express concern regarding cost of some medications, particularly Pradaxa, Depakote, and Abilify.  She state that the three of them alone are approximately $240/month and is requesting for financial assistance or programs to help with cost.  Pharmacy referral placed, wife aware.  She denies any further concerns, will follow up next week.  Valente Jaman, [place], MSN [place] [phone]

## 2016-02-18 ENCOUNTER — Telehealth: Payer: Self-pay | Admitting: Pulmonary Disease

## 2016-02-18 NOTE — Telephone Encounter (Signed)
Called and spoke to pt. Pt states the abx is not helping, offered appt for today 02/18/16. Pt refused today's appt, pt requesting appt tomorrow. Appt made with Dr. Vaughan Browner on 02/19/2016. Pt verbalized understanding and denied any further questions or concerns at this time.

## 2016-02-19 ENCOUNTER — Ambulatory Visit: Payer: Medicare Other | Admitting: Pulmonary Disease

## 2016-02-20 DIAGNOSIS — S22000D Wedge compression fracture of unspecified thoracic vertebra, subsequent encounter for fracture with routine healing: Secondary | ICD-10-CM | POA: Diagnosis not present

## 2016-02-21 ENCOUNTER — Telehealth: Payer: Self-pay | Admitting: Family Medicine

## 2016-02-21 ENCOUNTER — Ambulatory Visit: Payer: Medicare Other | Admitting: *Deleted

## 2016-02-21 ENCOUNTER — Other Ambulatory Visit: Payer: Self-pay | Admitting: *Deleted

## 2016-02-21 NOTE — Patient Outreach (Signed)
[facility]) Care Management  02/21/2016  CADARIUS NEVARES 1945-10-20 [id number]   Weekly transition of care call placed to member, wife answered phone and able to provide update.  She state that he has finished his antibiotics but still is not feeling that much better.  She report that he called the pulmonology office on Monday to report update on his health status and received appointment for Tuesday.  She then state that he did not make that appointment because he "didn't feel like going."  She is advised of the importance of follow up appointments and acute visits.  She verbalizes understanding, but also state that he had another appointment this week (with his back surgeon).  She state that it takes a lot to get him out of the home and understand why he didn't feel up to going.  She also state that she feel he "just needed to get out the house" reporting that he has been doing better since his appointment with his surgeon.  She state that they will keep the appointment for later this month (1/31), but will call MD office or seek emergency attention if needed before then.  She state that she is "disappointed" that they have not heard from palliative care or [place] regarding cost of medications.  She is advised that the [facility] pharmacist has 10 business days to contact them, referral was placed on 1/4.  She is also made aware that this care manager received call today from Manus Gunning with [facility] regarding referral.  Per Manus Gunning and chart, call has been placed to [facility] primary MD to get approval to proceed with assessment/evaluation.  Wife verbalizes understanding and expresses gratitude.  She deny any other concerns at this time.  Will follow up next week.  Valente Ladale, [place], MSN [place] [phone]

## 2016-02-21 NOTE — Telephone Encounter (Signed)
I will manage but I did not know that palliative services were started

## 2016-02-21 NOTE — Telephone Encounter (Signed)
Manus Gunning with [place] calling to see if Dr. [name] would be agreeable to manage patient for palliative care services.

## 2016-02-21 NOTE — Telephone Encounter (Signed)
Called fran and gave the verbal ok from PCP that she will manage palliative care for pt.

## 2016-02-27 ENCOUNTER — Ambulatory Visit: Payer: Self-pay | Admitting: *Deleted

## 2016-02-28 ENCOUNTER — Other Ambulatory Visit: Payer: Self-pay | Admitting: Pharmacist

## 2016-02-28 NOTE — Patient Outreach (Signed)
[facility]) Care Management  02/28/2016  Malik Zuniga 12-03-1945 [id number]  Patient referred to [place] for medication assistance evaluation by [place], [place].    Successful phone outreach to patient's spouse, [name], on [facility] Consent form and HIPAA details verified.   [name] states medications that are expensive include, Pradaxa, divalproex, and aripiprazole.    Spouse states patient has Medicare Part D benefits through [facility] Preferred RX Part D Plan.    Review of summary of benefits and preferred drug list for Part D plan shows there was a $405 deductible for calendar year 2018 and each of the three above medications are Tier 4, with an associated co-insurance of 35% at a preferred pharmacy or 36% co-insurance at a standard pharmacy.   Spouse reports they have worked with a company in the past that charged a monthly fee to help with patient assistance.   Explained to spouse [employer] [facility] does not charge a fee to help its members apply for patient assistance programs.   Spouse reports household income exceeds requirements for [facility] Extra Help.  Pradaxa: Discussed [place] program evaluates Part D beneficiaries on case-by-case basis.  Spouse would like to apply---application will be mailed to patient/spouse  Divalproex: Spouse told [facility] CM Pharmacist to not worry about this medication at this time.  Aripiprazole:  Discussed Administrator, arts.  Program requires patients to spend at least 5% of annual income on prescriptions to qualify.    Discussed as aripiprazole is Tier 4, prescriber can complete a Tier Exception request to patient's Part D plan to see if plan will lower medication from Tier 4 to Tier 3.  Spouse understands there is no guarantee Part D plan will approve request.    She wishes to attempt Tier exception request and states patient sees [name] at the [facility] office.     Plan:  [employer] cares [place] manager to [employer] placed to Dr [facility] office regarding contact for Tier Exception request---call was returned by [name], whom stated to fax tier exception request form attention [place].   Tier Exception for aripiprazole was sent to Dr [facility] office.   Will place follow-up call to patient/spouse over the next 2 weeks.    [name], PharmD, [place] [phone]

## 2016-02-29 ENCOUNTER — Encounter: Payer: Self-pay | Admitting: Pharmacist

## 2016-02-29 ENCOUNTER — Other Ambulatory Visit: Payer: Self-pay | Admitting: *Deleted

## 2016-02-29 NOTE — Patient Outreach (Signed)
[facility]) Care Management  02/29/2016  MCDANIEL OHMS 11-28-1945 [id number]   Call placed to member to follow up on current status and contact with [facility].  Spoke to wife, she state that the member is doing "alright."  She confirms that she was in contact with Pharmacist, K. Ruedinger, regarding affordability of medications.  She state that she paid $399 for a month's supply of Pradaxa due to deductible.  She report that the ongoing monthly cost with insurance will be $150, but Mr. Jenna Luo is looking into programs to offset the cost.    She denies any urgent concerns regarding member's health status at this time.  He continue to be compliant with medications and pulmonary vest.  She is made aware that transition of care program complete.  Office visit with pulmonology remains scheduled for 1/31.  She state that they had a visit with Tammy from [place] last week and was scheduled for another visit today.  She is unsure if the visit today will take place due to the weather.  She report that they received lots of paperwork and signed papers.  This care manager has not received notification from [facility] yet to confirm involvement.  Will await call from [facility], will close case once involvement is confirmed.  Will follow up within 2 weeks if no call/confirmation received.  Valente Jewell, [place], MSN [place] [phone]

## 2016-03-07 ENCOUNTER — Other Ambulatory Visit: Payer: Self-pay | Admitting: *Deleted

## 2016-03-07 MED ORDER — ATORVASTATIN CALCIUM 20 MG PO TABS: 20.0000 mg | ORAL_TABLET | Freq: Every day | ORAL | 1 refills | Status: DC

## 2016-03-11 ENCOUNTER — Telehealth: Payer: Self-pay | Admitting: Pulmonary Disease

## 2016-03-11 ENCOUNTER — Other Ambulatory Visit: Payer: Self-pay | Admitting: Pharmacist

## 2016-03-11 MED ORDER — LEVOFLOXACIN 750 MG PO TABS: 750.0000 mg | ORAL_TABLET | Freq: Every day | ORAL | 0 refills | Status: DC

## 2016-03-11 NOTE — Telephone Encounter (Signed)
BQ  Please Advise  Tammy from [place] called because this pt. Wife wanted her to reach out to [place] to see if we could prescribe an antibiotic to this pt. She stated she went to see the pt. And she noticed him doozing off a little more, o2 stating in the 90-91 on 2 1/2L, he denied sob,cough, and fever. She did mention her provider noticed Bronchi in he right upper lobe. They are aware that you wont be in the office until this afternoon   Next ov 03/12/16 with you

## 2016-03-11 NOTE — Telephone Encounter (Signed)
Levaquin '750mg'$  daily x7 days Keep tomorrow's appointment Go to ER sooner if worse

## 2016-03-11 NOTE — Telephone Encounter (Signed)
Informed Malik Zuniga of BQ's recc. The rx was sent into the pt.'s pharmacy nothing further is needed at this time.

## 2016-03-11 NOTE — Patient Outreach (Signed)
[facility]) Care Management  03/11/2016  TEDRICK PORT 1945/07/09 [id number]  Successful follow-up call to patient's spouse, on [facility] Consent form, HIPAA details verified.  Informed spouse Dr Karen Chafe office sent fax that [facility] approved Tier exception request for aripiprazole which Dr Milana Obey office completed.   Spouse verbalized understanding and denied questions/concerns at time of phone call.    Spouse reports she is working on [place] for [employer] and plans to mail to [facility] when she gets it completed.   Plan:  Will continued to follow-up with patient/spouse on patient assistance application for [place].   [name], PharmD, [place] [phone]

## 2016-03-12 ENCOUNTER — Other Ambulatory Visit: Payer: Self-pay | Admitting: Family Medicine

## 2016-03-12 ENCOUNTER — Ambulatory Visit: Payer: Medicare Other | Admitting: Pulmonary Disease

## 2016-03-13 ENCOUNTER — Other Ambulatory Visit: Payer: Self-pay | Admitting: *Deleted

## 2016-03-13 ENCOUNTER — Encounter: Payer: Self-pay | Admitting: *Deleted

## 2016-03-13 DIAGNOSIS — R0602 Shortness of breath: Secondary | ICD-10-CM | POA: Diagnosis not present

## 2016-03-13 DIAGNOSIS — J449 Chronic obstructive pulmonary disease, unspecified: Secondary | ICD-10-CM | POA: Diagnosis not present

## 2016-03-13 DIAGNOSIS — G4733 Obstructive sleep apnea (adult) (pediatric): Secondary | ICD-10-CM | POA: Diagnosis not present

## 2016-03-13 NOTE — Patient Outreach (Signed)
[facility]) Care Management  03/13/2016  Malik Zuniga 02/17/1945 [id number]   Call placed to member to follow up on contact and involvement with [employer].  He confirms that he is not working with Malik Zuniga for palliative care.  He state that he was not feeling well over the past week, but Malik Zuniga has been in contact with Dr. [place] Zuniga for follow up.  He report he is now on antibiotics and is feeling better.  He did not go to his appointment yesterday, rescheduled for this month.    He is made aware that due to involvement with [employer], this care manager will close case to nursing.  However, Pharmacist K. Ruedinger has been working with wife on medication assistance.  Member made aware that Malik Zuniga will continue to work with member until assistance applications are complete.  He verbalizes understanding.  He state that he will inform wife and if she has questions will have her contact this care manager.  Will notify primary of discipline closure.  Will provide update to Malik Zuniga.  Malik Zuniga, [place], MSN [place] [phone]

## 2016-03-14 DIAGNOSIS — J449 Chronic obstructive pulmonary disease, unspecified: Secondary | ICD-10-CM | POA: Diagnosis not present

## 2016-03-14 DIAGNOSIS — R0602 Shortness of breath: Secondary | ICD-10-CM | POA: Diagnosis not present

## 2016-03-14 DIAGNOSIS — G4733 Obstructive sleep apnea (adult) (pediatric): Secondary | ICD-10-CM | POA: Diagnosis not present

## 2016-03-18 DIAGNOSIS — I2510 Atherosclerotic heart disease of native coronary artery without angina pectoris: Secondary | ICD-10-CM | POA: Diagnosis not present

## 2016-03-18 DIAGNOSIS — Z515 Encounter for palliative care: Secondary | ICD-10-CM | POA: Diagnosis not present

## 2016-03-18 DIAGNOSIS — J449 Chronic obstructive pulmonary disease, unspecified: Secondary | ICD-10-CM | POA: Diagnosis not present

## 2016-03-18 DIAGNOSIS — R1310 Dysphagia, unspecified: Secondary | ICD-10-CM | POA: Diagnosis not present

## 2016-03-18 DIAGNOSIS — C3431 Malignant neoplasm of lower lobe, right bronchus or lung: Secondary | ICD-10-CM | POA: Diagnosis not present

## 2016-03-19 ENCOUNTER — Other Ambulatory Visit: Payer: Self-pay

## 2016-03-19 MED ORDER — DABIGATRAN ETEXILATE MESYLATE 150 MG PO CAPS: 150.0000 mg | ORAL_CAPSULE | Freq: Two times a day (BID) | ORAL | 3 refills | Status: DC

## 2016-03-21 ENCOUNTER — Other Ambulatory Visit: Payer: Self-pay | Admitting: Pharmacist

## 2016-03-21 NOTE — Patient Outreach (Signed)
[place] [facility]) Care Management  03/21/2016  Malik Zuniga 1945/03/27 [id number]  Successful phone outreach to patient, HIPAA details verified.    Patient reports his spouse was not available to speak with but patient reports he remembers completing [place] patient assistance form.    Counseled patient application was received in mail and sent to cardiologist for prescriber completion.    Patient denied further questions at time of call.    Plan:  Will continue to follow patient for patient assistance eligibility from [place].   [name], PharmD, [place] [phone]

## 2016-03-27 ENCOUNTER — Encounter: Payer: Self-pay | Admitting: *Deleted

## 2016-03-27 ENCOUNTER — Other Ambulatory Visit: Payer: Self-pay | Admitting: Pharmacist

## 2016-03-27 NOTE — Patient Outreach (Signed)
[facility]) Care Management  03/27/2016  JAMICHAEL KNOTTS 25-Nov-1945 [id number]  Incoming voicemail received from patient's spouse, on [facility] Consent form.  Returned call to patient's spouse, HIPAA details verified.   Spouse requesting update of [place] Patient Assistance application for patient's dabigatran.  Advised spouse application was received back from presciber and faxed to [employer] to be evaluated.   Unable to tell spouse how quickly a decision will be made by patient assistance program.   Spouse reports they received correspondence from his Part D insurance regarding Tier Exception for aripiprazole.  She states they have not tried to get refill and are not sure what cost is yet.    Plan:  Will continue to follow-up patient assistance for patient.    [name], PharmD, [place] [phone]

## 2016-03-31 ENCOUNTER — Telehealth: Payer: Self-pay | Admitting: *Deleted

## 2016-03-31 NOTE — Telephone Encounter (Signed)
CALLED PATIENT TO INFORM OF STAT LABS ON 04-16-16 - 9:15 AM AND HIS CT ON 04-16-16- ARRIVAL TIME - 10:15 AM , PATIENT TO HAVE CLEAR LIQUIDS ONLY - 4 HRS. PRIOR TO TEST AND HIS FU WITH ALISON PERKINS ON 04-17-16 @ 1:30 PM FOR RESULTS, SPOKE WITH PATIENT AND HE IS AWARE OF THESE APPTS.

## 2016-04-03 ENCOUNTER — Encounter: Payer: Self-pay | Admitting: Pulmonary Disease

## 2016-04-03 ENCOUNTER — Ambulatory Visit (INDEPENDENT_AMBULATORY_CARE_PROVIDER_SITE_OTHER): Payer: Medicare Other | Admitting: Pulmonary Disease

## 2016-04-03 DIAGNOSIS — J471 Bronchiectasis with (acute) exacerbation: Secondary | ICD-10-CM | POA: Diagnosis not present

## 2016-04-03 DIAGNOSIS — J449 Chronic obstructive pulmonary disease, unspecified: Secondary | ICD-10-CM

## 2016-04-03 DIAGNOSIS — R400 Somnolence: Secondary | ICD-10-CM | POA: Insufficient documentation

## 2016-04-03 DIAGNOSIS — G4710 Hypersomnia, unspecified: Secondary | ICD-10-CM

## 2016-04-03 MED ORDER — SODIUM CHLORIDE 3 % IN NEBU: INHALATION_SOLUTION | Freq: Two times a day (BID) | RESPIRATORY_TRACT | 12 refills | Status: DC

## 2016-04-03 MED ORDER — LEVOFLOXACIN 750 MG PO TABS: 750.0000 mg | ORAL_TABLET | Freq: Every day | ORAL | 0 refills | Status: DC

## 2016-04-03 MED ORDER — PREDNISONE 10 MG PO TABS: ORAL_TABLET | ORAL | 0 refills | Status: DC

## 2016-04-03 NOTE — Assessment & Plan Note (Signed)
Continue therapy vest twice a day At hypertonic saline twice a day

## 2016-04-03 NOTE — Progress Notes (Signed)
Subjective:    Patient ID: Malik Zuniga, male    DOB: 31-Jul-1945, 71 y.o.   MRN: [id number]  Synopsis: former patient of Dr. 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   [name] 04/2009:  FEV1 1.65 (47%), FEV1% 49  CT chest 10/2013:  RLL infiltrates CXR 10/2013:  LLL infiltrate +h/o aspiration in the past.  +speech therapy evaluation with recs given.  Admitted multiple times in 2017 for recurrent aspiration pneumonia. Started therapy vest 2017  HPI  Chief Complaint  Patient presents with  . Follow-up    Breathing is unchanged. He is not coughing any more than usual.  He has been producing more mucus over the past few days- brown in color. He feels more weak than usual today.    He is not sleeping too well at home lately, but he says that he has been breathing OK. He is sleeping a lot during the daytime.  He sleeps with oxygen. He is coughing more lately, getting up brown mucus lately. He has been using the therapy vest twice per day lately.   He is taking his inhaled medicines.   He stopped prednisone a few days ago and he has felt worse since then.  He is a little short of breath of breath. He has been trying to exercise some. No fever lately.   Past Medical History:  Diagnosis Date  . Anemia associated with acute blood loss   . Angioedema 10/31/2010  . Anxiety    takes Valium daily  . Aspiration pneumonia ([place]) 2010  . Asthma   . Bacteremia due to Pseudomonas 10/04/2011   D/t gram neg rods   . Bipolar affective disorder ([place])   . 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 Chronic back pain    compression fracture  . Constipation    takes Colace daily as well as Miralax  . Coronary artery disease   . Depression    takes Cymbalta daily  . Emphysema of lung (HCC)    Albuterol as needed;Symbicort daily and Singulair  at bedtime  . GERD (gastroesophageal reflux disease)    takes Omeprazole daily  . Headache, chronic daily   . History of bronchitis   . History of colon polyps   . History of migraine    last migraine a couple of days ago;takes Excedrin Migraine  . History of prostate cancer 2004  . Hyperlipidemia    takes Simvastatin daily  . Hypertension    takes Metoprolol daily as well as Hyzaar  . Insomnia    takes Benadryl nightly  . Lung cancer ([place]) 2016  . Mood change ([place])    after Brain surgery mood changed and was placed on Depakote  . Myocardial infarction 04/1998  . Neuromuscular disorder ([place]) 1998   right carpal tunnel release  . On home O2   . Shortness of breath    with exertion  . Stroke [facility]) 1998   Brain Aneurysm  . Tremor         Review of Systems  Constitutional: Negative for chills, fatigue and fever.  HENT: Negative for postnasal drip, rhinorrhea and sinus pressure.   Respiratory: Positive for cough and shortness of breath. Negative for wheezing.        Objective:   Physical Exam Vitals:   04/03/16 1226  BP: 118/80  Pulse: 78  Temp: 97.7 F (36.5 C)  TempSrc: Oral  SpO2: 90%  Weight: 156 lb (70.8 kg)  Height: '5\' 9"'$  (1.753 m)   RA  Gen: chronically ill appearing HENT: OP clear, TM's clear, neck supple PULM: Crackles bilaterally B, normal percussion CV: RRR, no mgr, trace edema GI: BS+, soft, nontender Derm: no cyanosis or rash Psyche: normal mood and affect   Records reviewed from his December 2017 hospitalization where he was treated for an acute exacerbation of bronchiectasis.  CBC    Component Value Date/Time   WBC 13.5 (H) 01/24/2016 0538   RBC 4.45 01/24/2016 0538   HGB 13.2 01/24/2016 0538   HCT 39.4 01/24/2016 0538   PLT 238 01/24/2016 0538   PLT 329 12/10/2009   MCV 88.5 01/24/2016 0538   MCH 29.7 01/24/2016 0538   MCHC 33.5 01/24/2016 0538   RDW 16.6 (H) 01/24/2016 0538   LYMPHSABS 1.0 01/23/2016 0543   MONOABS 0.3  01/23/2016 0543   EOSABS 0.0 01/23/2016 0543   BASOSABS 0.0 01/23/2016 0543         Assessment & Plan:  COPD   He has severe COPD complicated by recurrent exacerbations of bronchiectasis and aspiration pneumonia. Unfortunately lately he's been maintained on chronic steroids because of the severity of his symptoms. He just stopped this 2 days ago and now is having increasing shortness of breath wheezing chest congestion and mucus production. So he is having another flareup of his COPD/bronchiectasis: He has been compliant with his bronchodilator regimen as well as his therapy vest.  Plan: Prednisone taper, then go back to taking 10 mg daily until he sees [place] next, I suspect he will need a long slow taper off again Continue therapy vest twice a day Levaquin 7 days Continue DuoNeb 4 times a day Continue Pulmicort twice a day At hypertonic saline twice a day for mucociliary clearance  Bronchiectasis without acute exacerbation (HCC) Continue therapy vest twice a day At hypertonic saline twice a day  Daytime sleepiness This has been worse recently. I strongly suspect obstructive sleep apnea. He has had a mild degree of hypercapnia seen on recent ABGs.  Plan: Split-night study, may need BiPAP    Current Outpatient Prescriptions:  .  ARIPiprazole (ABILIFY) 10 MG tablet, Take 5 mg by mouth daily. , Disp: , Rfl:  .  Ascorbic Acid (VITAMIN C) 1000 MG tablet, Take 1,000 mg by mouth daily., Disp: , Rfl:  .  atorvastatin (LIPITOR) 20 MG tablet, Take 1 tablet (20 mg total) by mouth daily at 6 PM., Disp: 90 tablet, Rfl: 1 .  budesonide (PULMICORT) 0.25 MG/2ML nebulizer solution, Take 4 mLs (0.5 mg total) by nebulization 2 (two) times daily., Disp: 60 mL, Rfl: 3 .  cholecalciferol (VITAMIN D) 1000 UNITS tablet, Take 1,000 Units by mouth every morning. , Disp: , Rfl:  .  cyclobenzaprine (FLEXERIL) 5 MG tablet, Take 1 tablet (5 mg total) by mouth 3 (three) times daily as needed for muscle spasms.,  Disp: 20 tablet, Rfl: 0 .  diazepam (VALIUM) 5 MG tablet, Take 1 tablet (5 mg total) by mouth every 8 (eight) hours as needed for anxiety or muscle spasms. scheduled, Disp: 30 tablet, Rfl: 0 .  diltiazem (CARDIZEM CD) 240 MG 24 hr capsule, TAKE 1 CAPSULE EVERY DAY, Disp: 30 capsule, Rfl: 4 .  divalproex (DEPAKOTE) 500 MG 24 hr tablet, Take 1,000 mg by mouth at bedtime. , Disp: , Rfl:  .  docusate sodium (COLACE) 100  MG capsule, Take 200 mg by mouth at bedtime. , Disp: , Rfl:  .  doxycycline (VIBRA-TABS) 100 MG tablet, Take 1 tablet (100 mg total) by mouth 2 (two) times daily., Disp: 14 tablet, Rfl: 0 .  DULoxetine (CYMBALTA) 60 MG capsule, Take 120 mg by mouth daily. , Disp: , Rfl:  .  feeding supplement, ENSURE ENLIVE, (ENSURE ENLIVE) LIQD, Take 237 mLs by mouth 2 (two) times daily between meals., Disp: 237 mL, Rfl: 12 .  fluticasone (FLONASE) 50 MCG/ACT nasal spray, PLACE 2 SPRAYS INTO BOTH NOSTRILS DAILY. (Patient taking differently: PLACE 2 SPRAYS INTO BOTH NOSTRILS DAILY as needed for allergies), Disp: 16 g, Rfl: 2 .  HYDROcodone-acetaminophen (NORCO) 10-325 MG tablet, Take 1-2 tablets by mouth every 4 (four) hours as needed for pain., Disp: , Rfl: 0 .  ipratropium-albuterol (DUONEB) 0.5-2.5 (3) MG/3ML SOLN, Take 3 mLs by nebulization every 6 (six) hours., Disp: 360 mL, Rfl: 3 .  Maltodextrin-Xanthan Gum (RESOURCE THICKENUP CLEAR) POWD, Take 1 g by mouth as needed., Disp: 10 Can, Rfl: 3 .  metoprolol (LOPRESSOR) 50 MG tablet, Take 1 tablet (50 mg total) by mouth 2 (two) times daily., Disp: 180 tablet, Rfl: 3 .  montelukast (SINGULAIR) 10 MG tablet, TAKE 1 TABLET AT BEDTIME, Disp: 90 tablet, Rfl: 1 .  nitroGLYCERIN (NITROSTAT) 0.4 MG SL tablet, PLACE 1 TABLET (0.4 MG TOTAL) UNDER THE TONGUE EVERY 5 (FIVE) MINUTES AS NEEDED FOR CHEST PAIN., Disp: 25 tablet, Rfl: 2 .  omeprazole (PRILOSEC) 40 MG capsule, Take 40 mg by mouth 2 (two) times daily. , Disp: , Rfl:  .  primidone (MYSOLINE) 250 MG tablet,  TAKE 1 TABLET TWICE DAILY, Disp: 180 tablet, Rfl: 1 .  ranitidine (ZANTAC) 150 MG tablet, Take 150-300 mg by mouth 2 (two) times daily as needed for heartburn., Disp: , Rfl:  .  dabigatran (PRADAXA) 150 MG CAPS capsule, Take 1 capsule (150 mg total) by mouth every 12 (twelve) hours., Disp: 180 capsule, Rfl: 3 .  levofloxacin (LEVAQUIN) 750 MG tablet, Take 1 tablet (750 mg total) by mouth daily., Disp: 7 tablet, Rfl: 0 .  predniSONE (DELTASONE) 10 MG tablet, Take '40mg'$  po daily for 3 days, then take '30mg'$  po daily for 3 days, then take '20mg'$  po daily for two days, then take '10mg'$  po daily for 2 days; after that take '10mg'$  daily until you follow up with [place] in a month, Disp: 45 tablet, Rfl: 0 .  sodium chloride HYPERTONIC 3 % nebulizer solution, Take by nebulization 2 (two) times daily., Disp: 750 mL, Rfl: 12

## 2016-04-03 NOTE — Assessment & Plan Note (Signed)
This has been worse recently. I strongly suspect obstructive sleep apnea. He has had a mild degree of hypercapnia seen on recent ABGs.  Plan: Split-night study, may need BiPAP

## 2016-04-03 NOTE — Assessment & Plan Note (Signed)
He has severe COPD complicated by recurrent exacerbations of bronchiectasis and aspiration pneumonia. Unfortunately lately he's been maintained on chronic steroids because of the severity of his symptoms. He just stopped this 2 days ago and now is having increasing shortness of breath wheezing chest congestion and mucus production. So he is having another flareup of his COPD/bronchiectasis: He has been compliant with his bronchodilator regimen as well as his therapy vest.  Plan: Prednisone taper, then go back to taking 10 mg daily until he sees [place] next, I suspect he will need a long slow taper off again Continue therapy vest twice a day Levaquin 7 days Continue DuoNeb 4 times a day Continue Pulmicort twice a day At hypertonic saline twice a day for mucociliary clearance

## 2016-04-03 NOTE — Patient Instructions (Signed)
We will arrange for a sleep study Take the prednisone taper as prescribed and then stay on prednisone until you see me next Take the Levaquin as prescribed Use hypertonic saline (salt water nebulized solution) twice a day We will see you back in 4-6 weeks or sooner if needed

## 2016-04-07 ENCOUNTER — Other Ambulatory Visit: Payer: Self-pay | Admitting: Pharmacist

## 2016-04-07 ENCOUNTER — Telehealth: Payer: Self-pay | Admitting: Pulmonary Disease

## 2016-04-07 NOTE — Telephone Encounter (Signed)
Spoke with pt, states that his hypertonic saline is not being nebulized through his nebulizer- states that the medicine "just bubbles".  Pt states that his neb doesn't work well with his other neb meds and takes over 20 minutes for a neb tx.   Pt states he has had his nebulizer for approx 1 year and has it through [facility].  Called [facility], states that pt can bring in the nebulizer to the retail store and they can evaluate and repair/replace if necessary.  Spoke with pt, aware of above recs.  Will take machine to [facility] retail store for evaluation.  Nothing further needed.

## 2016-04-07 NOTE — Telephone Encounter (Signed)
atc pt X3, line rang to fast busy signal wcb

## 2016-04-07 NOTE — Patient Outreach (Signed)
[place] [facility]) Care Management  04/07/2016  Malik Zuniga 01/31/1946 [id number]  Successful phone outreach to patient and his spouse, HIPAA details verified.  Spouse reports they obtained refill of aripiprazole and co-pay was $0 after [place] Exception request was approved.   Updated spouse that [place] received application, but needed additional information, per representative.  Spouse reports [place] contacted her last week and she provided the information they needed.  Per phone call to [employer], application has been sent for processing and it may take 3-5 business days to receive an update.   Plan:  Will continue to follow-up with patient/spouse regarding patient assistance application for [place].   [name], PharmD, [place] [phone]

## 2016-04-09 ENCOUNTER — Other Ambulatory Visit: Payer: Self-pay | Admitting: Pharmacist

## 2016-04-09 NOTE — Patient Outreach (Signed)
[place] [facility]) Care Management  04/09/2016  Malik Zuniga 12-19-1945 [id number]  Successful phone outreach to patient.   Patient's spouse/caregiver unavailable at time of call and she has been point of contact for patient medication assistance.  Patient provided [facility] Pharmacist number and reports spouse will call back.    Inbound call from patient's spouse.  Updated spouse that per [place] automated system, patient's application was approved.    Spouse provided [place] phone number and states she will follow-up on next steps of patient's application.    Plan:  Will continue to follow-up with patient/spouse regarding patient assistance for Pradaxa.    [name], PharmD, [place] [phone]

## 2016-04-11 NOTE — Progress Notes (Signed)
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  Weight changes, if any: Wt Readings from Last 3 Encounters:  04/17/16 165 lb (74.8 kg)  04/03/16 156 lb (70.8 kg)  01/22/16 169 lb 5 oz (76.8 kg)   Respiratory complaints, if any: SOB on O2 2.5 L/min N/C at HS, coughing medium dark brown secretion using a percussion vest Hemoptysis, if any: None  Swallowing Problems/Pain/Difficulty swallowing:Pureeding food because he aspirates easilly Smoking Tobacco/Marijuana/Snuff/ETOH use:Former smoker x 52 years 1.5 P/D quit 07-17-12 no alcohol or drug usuage Appetite :Fair to poor some days one meal with ensure enlive  with  Much encourage from his wife.  On Predisone no evidence of thrush. Pain:3/10 to back taking Hydrocodone When is next chemo scheduled?:No Lab work from of chart: 04-16-16 [facility]:04-16-16 CT chest w contrast BP 134/86   Pulse 82   Temp 97.8 F (36.6 C) (Oral)   Resp 18   Ht '5\' 9"'$  (1.753 m)   Wt 165 lb (74.8 kg)   SpO2 94%   BMI 24.37 kg/m

## 2016-04-11 NOTE — Progress Notes (Deleted)
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

## 2016-04-15 DIAGNOSIS — K5900 Constipation, unspecified: Secondary | ICD-10-CM | POA: Diagnosis not present

## 2016-04-15 DIAGNOSIS — R110 Nausea: Secondary | ICD-10-CM | POA: Diagnosis not present

## 2016-04-15 DIAGNOSIS — K210 Gastro-esophageal reflux disease with esophagitis: Secondary | ICD-10-CM | POA: Diagnosis not present

## 2016-04-16 ENCOUNTER — Ambulatory Visit
Admission: RE | Admit: 2016-04-16 | Discharge: 2016-04-16 | Disposition: A | Discharge status: Home / Self Care | Payer: Medicare Other | Source: Ambulatory Visit | Attending: Radiation Oncology | Admitting: Radiation Oncology

## 2016-04-16 ENCOUNTER — Encounter: Payer: Self-pay | Admitting: *Deleted

## 2016-04-16 ENCOUNTER — Ambulatory Visit (HOSPITAL_COMMUNITY)
Admission: RE | Admit: 2016-04-16 | Discharge: 2016-04-16 | Disposition: A | Discharge status: Home / Self Care | Payer: Medicare Other | Source: Ambulatory Visit | Attending: Radiation Oncology | Admitting: Radiation Oncology

## 2016-04-16 DIAGNOSIS — I2510 Atherosclerotic heart disease of native coronary artery without angina pectoris: Secondary | ICD-10-CM | POA: Diagnosis not present

## 2016-04-16 DIAGNOSIS — I700 Atherosclerosis of aorta: Secondary | ICD-10-CM | POA: Insufficient documentation

## 2016-04-16 DIAGNOSIS — J439 Emphysema, unspecified: Secondary | ICD-10-CM | POA: Insufficient documentation

## 2016-04-16 DIAGNOSIS — C3431 Malignant neoplasm of lower lobe, right bronchus or lung: Secondary | ICD-10-CM

## 2016-04-16 DIAGNOSIS — C3491 Malignant neoplasm of unspecified part of right bronchus or lung: Secondary | ICD-10-CM | POA: Diagnosis not present

## 2016-04-16 DIAGNOSIS — C61 Malignant neoplasm of prostate: Secondary | ICD-10-CM | POA: Diagnosis not present

## 2016-04-16 LAB — BASIC METABOLIC PANEL
ANION GAP: 6 meq/L (ref 3–11)
BUN: 16.6 mg/dL (ref 7.0–26.0)
CALCIUM: 9.8 mg/dL (ref 8.4–10.4)
CO2: 30 mEq/L — ABNORMAL HIGH (ref 22–29)
Chloride: 99 mEq/L (ref 98–109)
Creatinine: 0.8 mg/dL (ref 0.7–1.3)
GLUCOSE: 109 mg/dL (ref 70–140)
POTASSIUM: 5.2 meq/L — AB (ref 3.5–5.1)
SODIUM: 136 meq/L (ref 136–145)

## 2016-04-16 MED ORDER — IOPAMIDOL (ISOVUE-300) INJECTION 61%
INTRAVENOUS | Status: AC
  Administered 2016-04-16: 75 mL
  Filled 2016-04-16: qty 75

## 2016-04-16 NOTE — Progress Notes (Signed)
[place] Dr. Belenda Cruise Tabori's office reported [facility] abnormal lab values  potassium 5.2 CO2 30 from today drawn at the Cancer center.

## 2016-04-16 NOTE — Progress Notes (Signed)
[place] Dr. Belenda Cruise Tabori's office reported [facility] abnormal lab values  potassium 5.2 CO2 30 from today drawn at the Cancer center. See progress note

## 2016-04-17 ENCOUNTER — Encounter: Payer: Self-pay | Admitting: Radiation Oncology

## 2016-04-17 ENCOUNTER — Ambulatory Visit
Admission: RE | Admit: 2016-04-17 | Discharge: 2016-04-17 | Disposition: A | Discharge status: Home / Self Care | Payer: Medicare Other | Source: Ambulatory Visit | Attending: Radiation Oncology | Admitting: Radiation Oncology

## 2016-04-17 VITALS — BP 134/86 | HR 82 | Temp 97.8°F | Resp 18 | Ht 69.0 in | Wt 165.0 lb

## 2016-04-17 DIAGNOSIS — I252 Old myocardial infarction: Secondary | ICD-10-CM | POA: Diagnosis not present

## 2016-04-17 DIAGNOSIS — Z87891 Personal history of nicotine dependence: Secondary | ICD-10-CM | POA: Diagnosis not present

## 2016-04-17 DIAGNOSIS — Z8042 Family history of malignant neoplasm of prostate: Secondary | ICD-10-CM | POA: Insufficient documentation

## 2016-04-17 DIAGNOSIS — Z79899 Other long term (current) drug therapy: Secondary | ICD-10-CM | POA: Diagnosis not present

## 2016-04-17 DIAGNOSIS — F319 Bipolar disorder, unspecified: Secondary | ICD-10-CM | POA: Diagnosis not present

## 2016-04-17 DIAGNOSIS — I10 Essential (primary) hypertension: Secondary | ICD-10-CM | POA: Insufficient documentation

## 2016-04-17 DIAGNOSIS — Z8701 Personal history of pneumonia (recurrent): Secondary | ICD-10-CM | POA: Diagnosis not present

## 2016-04-17 DIAGNOSIS — Z08 Encounter for follow-up examination after completed treatment for malignant neoplasm: Secondary | ICD-10-CM | POA: Diagnosis not present

## 2016-04-17 DIAGNOSIS — Z8673 Personal history of transient ischemic attack (TIA), and cerebral infarction without residual deficits: Secondary | ICD-10-CM | POA: Diagnosis not present

## 2016-04-17 DIAGNOSIS — Z808 Family history of malignant neoplasm of other organs or systems: Secondary | ICD-10-CM | POA: Diagnosis not present

## 2016-04-17 DIAGNOSIS — Z8249 Family history of ischemic heart disease and other diseases of the circulatory system: Secondary | ICD-10-CM | POA: Diagnosis not present

## 2016-04-17 DIAGNOSIS — G4700 Insomnia, unspecified: Secondary | ICD-10-CM | POA: Diagnosis not present

## 2016-04-17 DIAGNOSIS — E785 Hyperlipidemia, unspecified: Secondary | ICD-10-CM | POA: Insufficient documentation

## 2016-04-17 DIAGNOSIS — F419 Anxiety disorder, unspecified: Secondary | ICD-10-CM | POA: Insufficient documentation

## 2016-04-17 DIAGNOSIS — K219 Gastro-esophageal reflux disease without esophagitis: Secondary | ICD-10-CM | POA: Diagnosis not present

## 2016-04-17 DIAGNOSIS — Z8546 Personal history of malignant neoplasm of prostate: Secondary | ICD-10-CM | POA: Diagnosis not present

## 2016-04-17 DIAGNOSIS — C3431 Malignant neoplasm of lower lobe, right bronchus or lung: Secondary | ICD-10-CM

## 2016-04-17 DIAGNOSIS — Z7951 Long term (current) use of inhaled steroids: Secondary | ICD-10-CM | POA: Diagnosis not present

## 2016-04-17 DIAGNOSIS — Z7901 Long term (current) use of anticoagulants: Secondary | ICD-10-CM | POA: Insufficient documentation

## 2016-04-17 DIAGNOSIS — Z923 Personal history of irradiation: Secondary | ICD-10-CM | POA: Diagnosis not present

## 2016-04-17 DIAGNOSIS — Z8709 Personal history of other diseases of the respiratory system: Secondary | ICD-10-CM | POA: Diagnosis not present

## 2016-04-17 DIAGNOSIS — J439 Emphysema, unspecified: Secondary | ICD-10-CM | POA: Diagnosis not present

## 2016-04-17 DIAGNOSIS — Z9981 Dependence on supplemental oxygen: Secondary | ICD-10-CM | POA: Insufficient documentation

## 2016-04-17 DIAGNOSIS — Z888 Allergy status to other drugs, medicaments and biological substances status: Secondary | ICD-10-CM | POA: Diagnosis not present

## 2016-04-17 DIAGNOSIS — Z85118 Personal history of other malignant neoplasm of bronchus and lung: Secondary | ICD-10-CM | POA: Insufficient documentation

## 2016-04-17 DIAGNOSIS — I2510 Atherosclerotic heart disease of native coronary artery without angina pectoris: Secondary | ICD-10-CM | POA: Insufficient documentation

## 2016-04-17 NOTE — Progress Notes (Signed)
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    ICD-9-CM ICD-10-CM   1. Primary cancer of right lower lobe of lung (HCC) 162.5 C34.31     Interval Since Last Radiation:  9  months   SBRT Completed on 01/22/2015:  The right lower lobe target was treated to 50 Gy in 5 fractions of 10 Gy  Narrative:  Malik Zuniga is a pleasant. 71 y.o. gentleman who was diagnosed with Stage IA NSCLC, adenocarcinoma of the right lower lobe in 2016 who completed SBRT as definitive treatment. He has been NED from his cancer, however has had multiple episodes of aspiration pneumonia requiring hospital admission and use of a compression vest. His last admission was in December 2017. He has undergone restaging CT on 04/16/16 which again reveals no concerns for persistent, or recurrent lung cancer. He comes today to review these results.   On review of systems, the patient reports that he is doing well overall. He is maintaining he weight at this point and doing well with puree foods. He also continues ensure supplements, and reports he is stillusing he percussion vest regularly. He has a slight cough with yellow mucous production without fever. He denies any chest pain, shortness of breath, and continues using O2 at home.  He denies chills, night sweats, unintended weight changes. He denies any bowel or bladder disturbances, and denies abdominal pain, nausea or vomiting. He denies any new musculoskeletal or joint aches or pains, new skin lesions or concerns. A complete review of systems is obtained and is otherwise negative.  Past Medical History:  Past Medical History:    Diagnosis Date  . Anemia associated with acute blood loss   . Angioedema 10/31/2010  . Anxiety    takes Valium daily  . Aspiration pneumonia ([place]) 2010  . Asthma   . Bacteremia due to Pseudomonas 10/04/2011   D/t gram neg rods   . Bipolar affective disorder ([place])   . 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 Chronic back pain    compression fracture  . Constipation    takes Colace daily as well as Miralax  . Coronary artery disease   . Depression    takes Cymbalta daily  . Emphysema of lung (HCC)    Albuterol as needed;Symbicort daily and Singulair at bedtime  . GERD (gastroesophageal reflux disease)    takes Omeprazole daily  . Headache, chronic daily   . History of bronchitis   . History of colon polyps   . History of migraine    last migraine a couple of days ago;takes Excedrin Migraine  . History of prostate cancer 2004  . Hyperlipidemia    takes Simvastatin daily  . Hypertension    takes Metoprolol daily as well as Hyzaar  . Insomnia    takes Benadryl nightly  . Lung cancer ([place]) 2016  . Mood change ([place])    after Brain surgery mood changed and was placed on Depakote  . Myocardial infarction 04/1998  . Neuromuscular disorder ([place]) 1998   right carpal tunnel release  .  On home O2   . Shortness of breath    with exertion  . Stroke [facility]) 1998   Brain Aneurysm  . Tremor     Past Surgical History: Past Surgical History:  Procedure Laterality Date  . BACK SURGERY  08/2004; 02/2005; 04/2006; 06/2007; 7/20101   all by Dr. Annette Stable  . BACK SURGERY  05/2013  . BRAIN SURGERY  1999   clip aneurysm  . [place]   right  . CHOLECYSTECTOMY  1996  . COLONOSCOPY    . CORONARY ANGIOPLASTY WITH STENT PLACEMENT  2000   RCA stent, Dr. Rollene Fare; denies stent for PAD 06/15/13  . CORONARY ANGIOPLASTY WITH STENT PLACEMENT  04/1998  . CRANIOTOMY  1999   to clip aneurism, Dr. Annette Stable  . DG BIOPSY LUNG    . ESOPHAGOGASTRODUODENOSCOPY N/A  07/23/2012   Procedure: ESOPHAGOGASTRODUODENOSCOPY (EGD);  Surgeon: Jeryl Columbia, MD;  Location: [facility] ENDOSCOPY;  Service: Endoscopy;  Laterality: N/A;  buccini /ja  . GASTROSTOMY W/ FEEDING TUBE  2008   Dr. Watt Climes; only had for a few months  . HAND SURGERY  1989   crushed thumb, Dr. Fredna Dow  . PROSTATECTOMY  04/2001   removal of prostate cancer, Dr. Janice Norrie  . SPINE SURGERY    . [place]   left arm, Dr. Fredna Dow    Social History:  Social History   Social History  . Marital status: Married    Spouse name: N/A  . Number of children: N/A  . Years of education: N/A   Occupational History  . Not on file.   Social History Main Topics  . Smoking status: Former Smoker    Packs/day: 1.50    Years: 52.00    Types: Cigarettes    Quit date: 07/17/2012  . Smokeless tobacco: Never Used     Comment: Former smoker  . Alcohol use No  . Drug use: No  . Sexual activity: No   Other Topics Concern  . Not on file   Social History Narrative  . No narrative on file    Family History: Family History  Problem Relation Age of Onset  . Heart disease Mother   . Cancer Father     brain cancer and prostate cancer     ALLERGIES:  is allergic to lisinopril; lamictal [lamotrigine]; and ambien [zolpidem].  Meds: Current Outpatient Prescriptions  Medication Sig Dispense Refill  . ARIPiprazole (ABILIFY) 10 MG tablet Take 5 mg by mouth daily.     . Ascorbic Acid (VITAMIN C) 1000 MG tablet Take 1,000 mg by mouth daily.    Marland Kitchen atorvastatin (LIPITOR) 20 MG tablet Take 1 tablet (20 mg total) by mouth daily at 6 PM. 90 tablet 1  . budesonide (PULMICORT) 0.25 MG/2ML nebulizer solution Take 4 mLs (0.5 mg total) by nebulization 2 (two) times daily. 60 mL 3  . cholecalciferol (VITAMIN D) 1000 UNITS tablet Take 1,000 Units by mouth every morning.     . cyclobenzaprine (FLEXERIL) 5 MG tablet Take 1 tablet (5 mg total) by mouth 3 (three) times daily as needed for muscle spasms. 20 tablet 0  .  dabigatran (PRADAXA) 150 MG CAPS capsule Take 1 capsule (150 mg total) by mouth every 12 (twelve) hours. 180 capsule 3  . diazepam (VALIUM) 5 MG tablet Take 1 tablet (5 mg total) by mouth every 8 (eight) hours as needed for anxiety or muscle spasms. scheduled 30 tablet 0  . diltiazem (CARDIZEM CD) 240 MG 24  hr capsule TAKE 1 CAPSULE EVERY DAY 30 capsule 4  . divalproex (DEPAKOTE) 500 MG 24 hr tablet Take 1,000 mg by mouth at bedtime.     . docusate sodium (COLACE) 100 MG capsule Take 200 mg by mouth at bedtime.     . DULoxetine (CYMBALTA) 60 MG capsule Take 120 mg by mouth daily.     . feeding supplement, ENSURE ENLIVE, (ENSURE ENLIVE) LIQD Take 237 mLs by mouth 2 (two) times daily between meals. 237 mL 12  . fluticasone (FLONASE) 50 MCG/ACT nasal spray PLACE 2 SPRAYS INTO BOTH NOSTRILS DAILY. (Patient taking differently: PLACE 2 SPRAYS INTO BOTH NOSTRILS DAILY as needed for allergies) 16 g 2  . HYDROcodone-acetaminophen (NORCO) 10-325 MG tablet Take 1-2 tablets by mouth every 4 (four) hours as needed for pain.  0  . ipratropium-albuterol (DUONEB) 0.5-2.5 (3) MG/3ML SOLN Take 3 mLs by nebulization every 6 (six) hours. 360 mL 3  . Maltodextrin-Xanthan Gum (RESOURCE THICKENUP CLEAR) POWD Take 1 g by mouth as needed. 10 Can 3  . metoprolol (LOPRESSOR) 50 MG tablet Take 1 tablet (50 mg total) by mouth 2 (two) times daily. 180 tablet 3  . montelukast (SINGULAIR) 10 MG tablet TAKE 1 TABLET AT BEDTIME 90 tablet 1  . omeprazole (PRILOSEC) 40 MG capsule Take 40 mg by mouth 2 (two) times daily.     . predniSONE (DELTASONE) 10 MG tablet Take '40mg'$  po daily for 3 days, then take '30mg'$  po daily for 3 days, then take '20mg'$  po daily for two days, then take '10mg'$  po daily for 2 days; after that take '10mg'$  daily until you follow up with [place] in a month 45 tablet 0  . primidone (MYSOLINE) 250 MG tablet TAKE 1 TABLET TWICE DAILY 180 tablet 1  . ranitidine (ZANTAC) 150 MG tablet Take 150-300 mg by mouth 2 (two) times daily as  needed for heartburn.    . sodium chloride HYPERTONIC 3 % nebulizer solution Take by nebulization 2 (two) times daily. 750 mL 12  . nitroGLYCERIN (NITROSTAT) 0.4 MG SL tablet PLACE 1 TABLET (0.4 MG TOTAL) UNDER THE TONGUE EVERY 5 (FIVE) MINUTES AS NEEDED FOR CHEST PAIN. (Patient not taking: Reported on 04/17/2016) 25 tablet 2   No current facility-administered medications for this encounter.     Physical Findings:  height is '5\' 9"'$  (1.753 m) and weight is 165 lb (74.8 kg). His oral temperature is 97.8 F (36.6 C). His blood pressure is 134/86 and his pulse is 82. His respiration is 18 and oxygen saturation is 94%.   In general this is a well appearing Caucasian male in no acute distress who's comfortably sitting in a wheelchair. He is alert and oriented x4 and appropriate throughout the examination. HEENT reveals that the patient is normocephalic, atraumatic. EOMs are intact. PERRLA. Skin is intact without any evidence of gross lesions. Cardiovascular exam reveals a regular rate and rhythm, no clicks rubs or murmurs are auscultated. Chest is clear to auscultation bilaterally. Lymphatic assessment is performed and does not reveal any adenopathy in the cervical, supraclavicular, axillary, or inguinal chains. Abdomen has active bowel sounds in all quadrants and is intact. The abdomen is soft, non tender, non distended. Lower extremities are negative for pretibial pitting edema, deep calf tenderness, cyanosis or clubbing.  Lab Findings: Lab Results  Component Value Date   WBC 13.5 (H) 01/24/2016   HGB 13.2 01/24/2016   HCT 39.4 01/24/2016   PLT 238 01/24/2016   PLT 329 12/10/2009  Lab Results  Component Value Date   NA 136 04/16/2016   K 5.2 (H) 04/16/2016   CHLORIDE 99 04/16/2016   CO2 30 (H) 04/16/2016   GLUCOSE 109 04/16/2016   GLUCOSE 88 12/10/2009   BUN 16.6 04/16/2016   CREATININE 0.8 04/16/2016   BILITOT 0.6 01/23/2016   ALKPHOS 33 (L) 01/23/2016   AST 19 01/23/2016   ALT 17  01/23/2016   PROT 6.1 (L) 01/23/2016   ALBUMIN 3.2 (L) 01/23/2016   CALCIUM 9.8 04/16/2016   ANIONGAP 6 04/16/2016   ANIONGAP 8 01/24/2016    Radiographic Findings: Ct Chest W Contrast  Result Date: 04/16/2016 CLINICAL DATA:  Restaging right lung cancer.  Prostate cancer. EXAM: CT CHEST WITH CONTRAST TECHNIQUE: Multidetector CT imaging of the chest was performed during intravenous contrast administration. CONTRAST:  52m ISOVUE-300 IOPAMIDOL (ISOVUE-300) INJECTION 61% COMPARISON:  01/21/2016. FINDINGS: Cardiovascular: Atherosclerotic calcification of the arterial vasculature, including three-vessel involvement of the coronary arteries. Heart size normal. No pericardial effusion. Mediastinum/Nodes: No pathologically enlarged mediastinal, hilar or axillary lymph nodes. Esophagus is grossly unremarkable. Lungs/Pleura: Centrilobular emphysema. Scarring and volume loss in the right lower lobe. Mild scattered scarring and volume loss bilaterally. Lungs are otherwise clear. No pleural fluid. Probable debris in the left mainstem bronchus. Upper Abdomen: Low-attenuation lesions in the liver measure up to 1.4 cm, stable and likely cysts. Cholecystectomy. Right adrenal gland is unremarkable. There may be slight nodular thickening of the left adrenal gland. Visualized portions of the kidneys, spleen, pancreas, stomach and bowel are grossly unremarkable. Musculoskeletal: Postoperative changes are seen in the thoracolumbar spine. No worrisome lytic or sclerotic lesions. Cold posterior right rib fracture. Mid thoracic compression fracture is unchanged. IMPRESSION: 1. No evidence of recurrent or metastatic disease. 2. Aortic atherosclerosis (ICD10-170.0). Three-vessel coronary artery calcification. 3.  Emphysema (ICD10-J43.9). Electronically Signed   By: MLorin PicketM.D.   On: 04/16/2016 14:29    Impression/Plan: 1. Stage IA, T1a, N0, M0, NSCLC, adenocarcinoma of the RLL. The patient is doing well again based on  his CT imaging. We will repeat his next scan in 6 months  of the chest. He will keep u[place]formed of any questions or concerns that arise prior to this visit. 2. Intermittent aspiration pneumonia. The patient will continue per pulmonology, and use his percussion vest to help mobilize secretions and decrease his risks of recurrent pneumonia.     ACarola Rhine PAC

## 2016-04-20 ENCOUNTER — Other Ambulatory Visit: Payer: Self-pay | Admitting: Radiation Oncology

## 2016-04-20 DIAGNOSIS — C3431 Malignant neoplasm of lower lobe, right bronchus or lung: Secondary | ICD-10-CM

## 2016-04-21 ENCOUNTER — Ambulatory Visit: Payer: Medicare Other | Admitting: Pharmacist

## 2016-04-23 ENCOUNTER — Other Ambulatory Visit: Payer: Self-pay | Admitting: Family Medicine

## 2016-04-23 ENCOUNTER — Other Ambulatory Visit: Payer: Self-pay | Admitting: Pharmacist

## 2016-04-23 ENCOUNTER — Other Ambulatory Visit: Payer: Self-pay | Admitting: Pulmonary Disease

## 2016-04-23 NOTE — Patient Outreach (Signed)
[facility]) Care Management  04/23/2016  ORLA JOLLIFF 07-04-1945 [id number]  Successful phone outreach to patient's spouse on [facility] Consent form.  Spouse reports patient received dabigatran (Pradaxa) from [employer] patient assistance program for 90 day supply.  She reports she received directions on how to obtain refills.    Purpose of pharmacy referral was for medication assistance.  Assistance has been successfully received for aripiprazole (tier exception from patient's Part D plan approved), and dabigatran manufacturer assistance was received.    Reminded spouse patient will need to reapply for manufacturer assistance for dabigatran in 2019 if he remains on medication.   Spouse denies other medication related questions/concerns at this time.  She confirmed she has [facility] Pharmacist phone number if new issues arise.   Plan:  Will close pharmacy case as goal has been met.   Will update Dr Tamala Julian, cardiology, patient was approved for manufacturer assistance for dabigatran.    [name], PharmD, [place] [phone]

## 2016-05-05 DIAGNOSIS — F331 Major depressive disorder, recurrent, moderate: Secondary | ICD-10-CM | POA: Diagnosis not present

## 2016-05-08 ENCOUNTER — Encounter: Payer: Self-pay | Admitting: Interventional Cardiology

## 2016-05-14 DIAGNOSIS — H2513 Age-related nuclear cataract, bilateral: Secondary | ICD-10-CM | POA: Diagnosis not present

## 2016-05-20 NOTE — Progress Notes (Signed)
Cardiology Office Note    Date:  05/21/2016   ID:  Malik Zuniga, DOB 1945/04/09, MRN [id number]  PCP:  [name], MD  Cardiologist: Sinclair Grooms, MD   Chief Complaint  Patient presents with  . Coronary Artery Disease    History of Present Illness:  Malik Zuniga is a 71 y.o. 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  [name] has had several admissions initiated due to aspiration/community-acquired pneumonia. A July admission was also associated with elevation in cardiac markers but workup as outlined below did not reveal significant abnormalities with reference to coronary blood flow. He denies angina. He has not needed nitroglycerin. He denies lower extremity swelling.  Past Medical History:  Diagnosis Date  . Anemia associated with acute blood loss   . Angioedema 10/31/2010  . Anxiety    takes Valium daily  . Aspiration pneumonia ([place]) 2010  . Asthma   . Bacteremia due to Pseudomonas 10/04/2011   D/t gram neg rods   . Bipolar affective disorder ([place])   . 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 Chronic back pain    compression fracture  . Constipation    takes Colace daily as well as Miralax  . Coronary artery disease   . Depression    takes Cymbalta daily  . Emphysema of lung (HCC)    Albuterol as needed;Symbicort daily and Singulair at bedtime  . GERD (gastroesophageal reflux disease)    takes Omeprazole daily  . Headache, chronic daily   . History of bronchitis   . History of colon polyps   . History of migraine    last migraine a couple of days ago;takes Excedrin Migraine  . History of prostate cancer 2004  . Hyperlipidemia    takes Simvastatin daily  . Hypertension    takes Metoprolol  daily as well as Hyzaar  . Insomnia    takes Benadryl nightly  . Lung cancer ([place]) 2016  . Mood change ([place])    after Brain surgery mood changed and was placed on Depakote  . Myocardial infarction 04/1998  . Neuromuscular disorder ([place]) 1998   right carpal tunnel release  . On home O2   . Shortness of breath    with exertion  . Stroke [facility]) 1998   Brain Aneurysm  . Tremor     Past Surgical History:  Procedure Laterality Date  . BACK SURGERY  08/2004; 02/2005; 04/2006; 06/2007; 7/20101   all by Dr. Annette Stable  . BACK SURGERY  05/2013  . BRAIN SURGERY  1999   clip aneurysm  . [place]   right  . CHOLECYSTECTOMY  1996  . COLONOSCOPY    . CORONARY ANGIOPLASTY WITH STENT PLACEMENT  2000   RCA stent, Dr. Rollene Fare; denies stent for PAD 06/15/13  . CORONARY ANGIOPLASTY WITH STENT PLACEMENT  04/1998  . CRANIOTOMY  1999   to clip aneurism, Dr. Annette Stable  . DG BIOPSY LUNG    . ESOPHAGOGASTRODUODENOSCOPY N/A 07/23/2012   Procedure: ESOPHAGOGASTRODUODENOSCOPY (EGD);  Surgeon: Jeryl Columbia, MD;  Location: [facility] ENDOSCOPY;  Service: Endoscopy;  Laterality: N/A;  buccini /ja  . GASTROSTOMY W/ FEEDING TUBE  2008   Dr. Watt Climes; only had for a few months  .  HAND SURGERY  1989   crushed thumb, Dr. Fredna Dow  . PROSTATECTOMY  04/2001   removal of prostate cancer, Dr. Janice Norrie  . SPINE SURGERY    . [place]   left arm, Dr. Fredna Dow    Current Medications: Outpatient Medications Prior to Visit  Medication Sig Dispense Refill  . ARIPiprazole (ABILIFY) 10 MG tablet Take 5 mg by mouth daily.     . Ascorbic Acid (VITAMIN C) 1000 MG tablet Take 1,000 mg by mouth daily.    Marland Kitchen atorvastatin (LIPITOR) 20 MG tablet Take 1 tablet (20 mg total) by mouth daily at 6 PM. 90 tablet 1  . budesonide (PULMICORT) 0.25 MG/2ML nebulizer solution Take 4 mLs (0.5 mg total) by nebulization 2 (two) times daily. 60 mL 3  . cholecalciferol (VITAMIN D) 1000 UNITS tablet Take 1,000 Units by mouth every morning.     .  cyclobenzaprine (FLEXERIL) 5 MG tablet Take 1 tablet (5 mg total) by mouth 3 (three) times daily as needed for muscle spasms. 20 tablet 0  . dabigatran (PRADAXA) 150 MG CAPS capsule Take 1 capsule (150 mg total) by mouth every 12 (twelve) hours. 180 capsule 3  . diazepam (VALIUM) 5 MG tablet Take 1 tablet (5 mg total) by mouth every 8 (eight) hours as needed for anxiety or muscle spasms. scheduled 30 tablet 0  . diltiazem (CARDIZEM CD) 240 MG 24 hr capsule TAKE 1 CAPSULE EVERY DAY 30 capsule 4  . divalproex (DEPAKOTE) 500 MG 24 hr tablet Take 1,000 mg by mouth at bedtime.     . docusate sodium (COLACE) 100 MG capsule Take 200 mg by mouth at bedtime.     . DULoxetine (CYMBALTA) 60 MG capsule Take 120 mg by mouth daily.     Marland Kitchen HYDROcodone-acetaminophen (NORCO) 10-325 MG tablet Take 1-2 tablets by mouth every 4 (four) hours as needed for pain.  0  . ipratropium-albuterol (DUONEB) 0.5-2.5 (3) MG/3ML SOLN Take 3 mLs by nebulization every 6 (six) hours. 360 mL 3  . Maltodextrin-Xanthan Gum (RESOURCE THICKENUP CLEAR) POWD Take 1 g by mouth as needed. 10 Can 3  . metoprolol (LOPRESSOR) 50 MG tablet Take 1 tablet (50 mg total) by mouth 2 (two) times daily. 180 tablet 3  . montelukast (SINGULAIR) 10 MG tablet TAKE 1 TABLET AT BEDTIME 90 tablet 1  . nitroGLYCERIN (NITROSTAT) 0.4 MG SL tablet PLACE 1 TABLET (0.4 MG TOTAL) UNDER THE TONGUE EVERY 5 (FIVE) MINUTES AS NEEDED FOR CHEST PAIN. 25 tablet 2  . omeprazole (PRILOSEC) 40 MG capsule Take 40 mg by mouth 2 (two) times daily.     . predniSONE (DELTASONE) 10 MG tablet Take 1 tablet (10 mg total) by mouth daily with breakfast. 30 tablet 0  . primidone (MYSOLINE) 250 MG tablet TAKE 1 TABLET TWICE DAILY 180 tablet 1  . ranitidine (ZANTAC) 150 MG tablet Take 150-300 mg by mouth 2 (two) times daily as needed for heartburn.    . sodium chloride HYPERTONIC 3 % nebulizer solution Take by nebulization 2 (two) times daily. 750 mL 12  . tiZANidine (ZANAFLEX) 4 MG tablet  TAKE 1 TABLET BY MOUTH EVERY 8 HOURS AS NEEDED FOR MUSCLE SPASMS. 90 tablet 0  . feeding supplement, ENSURE ENLIVE, (ENSURE ENLIVE) LIQD Take 237 mLs by mouth 2 (two) times daily between meals. (Patient not taking: Reported on 05/21/2016) 237 mL 12  . fluticasone (FLONASE) 50 MCG/ACT nasal spray PLACE 2 SPRAYS INTO BOTH NOSTRILS DAILY. (Patient not taking: Reported on  05/21/2016) 16 g 2   No facility-administered medications prior to visit.      Allergies:   Lisinopril; Lamictal [lamotrigine]; and Ambien [zolpidem]   Social History   Social History  . Marital status: Married    Spouse name: N/A  . Number of children: N/A  . Years of education: N/A   Social History Main Topics  . Smoking status: Former Smoker    Packs/day: 1.50    Years: 52.00    Types: Cigarettes    Quit date: 07/17/2012  . Smokeless tobacco: Never Used     Comment: Former smoker  . Alcohol use No  . Drug use: No  . Sexual activity: No   Other Topics Concern  . None   Social History Narrative  . None     Family History:  The patient's family history includes Cancer in his father; Heart disease in his mother.   ROS:   Please see the history of present illness.    Musculoskeletal pain especially lower back. Chronic cough. Occasional chills and fever. Easy bruising. No blood in his urine or stool.  All other systems reviewed and are negative.   PHYSICAL EXAM:   VS:  BP 114/76 (BP Location: Right Arm)   Pulse 86   Ht '5\' 10"'$  (1.778 m)    GEN: Well nourished, well developed, in no acute distress . Obvious kyphoscoliosis. HEENT: normal  Neck: no JVD, carotid bruits, or masses Cardiac: RRR; no murmurs, rubs, or gallops,no edema  Respiratory:  clear to auscultation bilaterally, normal work of breathing GI: soft, nontender, nondistended, + BS MS: no deformity or atrophy  Skin: warm and dry, no rash Neuro:  Alert and Oriented x 3, Strength and sensation are intact Psych: euthymic mood, full affect  Wt  Readings from Last 3 Encounters:  04/17/16 165 lb (74.8 kg)  04/03/16 156 lb (70.8 kg)  01/22/16 169 lb 5 oz (76.8 kg)      Studies/Labs Reviewed:   EKG:  EKG  The most recent tracing of 01/22/16 reveals normal sinus rhythm with nonspecific ST abnormality.  Recent Labs: 06/05/2015: TSH 0.57 12/18/2015: Magnesium 1.7 01/21/2016: B Natriuretic Peptide 55.8 01/23/2016: ALT 17 01/24/2016: Hemoglobin 13.2; Platelets 238 04/16/2016: BUN 16.6; Creatinine 0.8; Potassium 5.2; Sodium 136   Lipid Panel    Component Value Date/Time   CHOL 249 (H) 06/05/2015 1119   TRIG 54.0 06/05/2015 1119   HDL 114.60 06/05/2015 1119   CHOLHDL 2 06/05/2015 1119   VLDL 10.8 06/05/2015 1119   LDLCALC 124 (H) 06/05/2015 1119   LDLDIRECT 112.3 04/22/2012 1127    Additional studies/ records that were reviewed today include:  Myocardial perfusion imaging 07/13/15: Study Highlights    Nuclear stress EF: 48%.  There was no ST segment deviation noted during stress.  There is a medium defect of moderate severity present in the basal inferolateral, mid inferolateral and apical lateral location. The defect is non-reversible. This is most consistent with diaphragmatic attenuation artifact. No ischemia noted.  There is a small defect of moderate severity present in the apex location. The defect is non-reversible. This is most consistent with diaphragmatic attenuation artifact. No ischemia noted.  There is a small defect of moderate severity present in the apical anterior and apical septal location. The defect is non-reversible. This is most consistent with diaphragmatic attenuation artifact. No ischemia noted.  The left ventricular ejection fraction is mildly decreased (45-54%).  This is a low risk study.   Echocardiography 08/06/15: Study Conclusions  - Left  ventricle: The cavity size was normal. Systolic function was   normal. The estimated ejection fraction was in the range of 50%   to 55%. Wall motion was  normal; there were no regional wall   motion abnormalities. Left ventricular diastolic function   parameters were normal. - Aortic root: The aortic root was normal in size. - Left atrium: The atrium was mildly dilated. - Right ventricle: The cavity size was mildly dilated. Wall   thickness was normal. Systolic function was moderately reduced. - Right atrium: The atrium was normal in size. - Tricuspid valve: There was mild regurgitation. - Pulmonary arteries: Systolic pressure was within the normal   range. - Inferior vena cava: The vessel was normal in size. - Pericardium, extracardiac: There was no pericardial effusion.  Impressions:  - This is a limited quality study.   LVEF appears low normal.   RV appears dilated with probably moderate systolic dysfunction.  ASSESSMENT:    1. Atrial flutter with rapid ventricular response ([place])   2. Coronary artery disease of native artery of native heart with stable angina pectoris ([place])   3. Essential hypertension   4. Paroxysmal atrial fibrillation (HCC)      PLAN:  In order of problems listed above:  1. Stable without recent documentation of atrial fib rates will flutter. Continue long-term anticoagulation therapy to prevent embolic stroke. 2. Stable without angina. Low risk myocardial perfusion study June 2017. Use nitroglycerin the chest discomfort. Notify if nitroglycerin is required. 3. 2 g sodium diet. Blood pressure is well controlled. No changes in therapy required.  Continue current medical regimen as listed. Clinical follow-up in one year. Prior to that time of chest discomfort.    Medication Adjustments/Labs and Tests Ordered: Current medicines are reviewed at length with the patient today.  Concerns regarding medicines are outlined above.  Medication changes, Labs and Tests ordered today are listed in the Patient Instructions below. Patient Instructions  Medication Instructions:  Your physician recommends that you  continue on your current medications as directed. Please refer to the Current Medication list given to you today.  Labwork: None ordered  Testing/Procedures: None ordered  Follow-Up: Your physician wants you to follow-up in 1 year with Dr. Tamala Julian. You will receive a reminder letter in the mail two months in advance. If you don't receive a letter, please call our office to schedule the follow-up appointment.   Any Other Special Instructions Will Be Listed Below (If Applicable).     If you need a refill on your cardiac medications before your next appointment, please call your pharmacy.      Signed, Sinclair Grooms, MD  05/21/2016 11:35 AM    [facility] [place], [place], [place]  [address] Phone: [phone]; Fax: [phone]

## 2016-05-21 ENCOUNTER — Encounter: Payer: Self-pay | Admitting: Interventional Cardiology

## 2016-05-21 ENCOUNTER — Other Ambulatory Visit: Payer: Self-pay | Admitting: Family Medicine

## 2016-05-21 ENCOUNTER — Ambulatory Visit (INDEPENDENT_AMBULATORY_CARE_PROVIDER_SITE_OTHER): Payer: Medicare Other | Admitting: Interventional Cardiology

## 2016-05-21 VITALS — BP 114/76 | HR 86 | Ht 70.0 in

## 2016-05-21 DIAGNOSIS — I10 Essential (primary) hypertension: Secondary | ICD-10-CM | POA: Diagnosis not present

## 2016-05-21 DIAGNOSIS — I25118 Atherosclerotic heart disease of native coronary artery with other forms of angina pectoris: Secondary | ICD-10-CM

## 2016-05-21 DIAGNOSIS — I4892 Unspecified atrial flutter: Secondary | ICD-10-CM

## 2016-05-21 DIAGNOSIS — I480 Paroxysmal atrial fibrillation: Secondary | ICD-10-CM

## 2016-05-21 DIAGNOSIS — I209 Angina pectoris, unspecified: Secondary | ICD-10-CM

## 2016-05-21 NOTE — Patient Instructions (Signed)

## 2016-05-22 ENCOUNTER — Ambulatory Visit (HOSPITAL_BASED_OUTPATIENT_CLINIC_OR_DEPARTMENT_OTHER): Payer: Medicare Other | Attending: Pulmonary Disease | Admitting: Pulmonary Disease

## 2016-05-22 DIAGNOSIS — I10 Essential (primary) hypertension: Secondary | ICD-10-CM | POA: Diagnosis not present

## 2016-05-22 DIAGNOSIS — Z79899 Other long term (current) drug therapy: Secondary | ICD-10-CM | POA: Diagnosis not present

## 2016-05-22 DIAGNOSIS — J449 Chronic obstructive pulmonary disease, unspecified: Secondary | ICD-10-CM | POA: Diagnosis not present

## 2016-05-22 DIAGNOSIS — G4736 Sleep related hypoventilation in conditions classified elsewhere: Secondary | ICD-10-CM | POA: Diagnosis not present

## 2016-05-22 DIAGNOSIS — F513 Sleepwalking [somnambulism]: Secondary | ICD-10-CM | POA: Insufficient documentation

## 2016-05-22 DIAGNOSIS — G4710 Hypersomnia, unspecified: Secondary | ICD-10-CM | POA: Insufficient documentation

## 2016-05-27 ENCOUNTER — Encounter: Payer: Self-pay | Admitting: Pulmonary Disease

## 2016-05-27 ENCOUNTER — Ambulatory Visit (INDEPENDENT_AMBULATORY_CARE_PROVIDER_SITE_OTHER): Payer: Medicare Other | Admitting: Pulmonary Disease

## 2016-05-27 DIAGNOSIS — J471 Bronchiectasis with (acute) exacerbation: Secondary | ICD-10-CM | POA: Diagnosis not present

## 2016-05-27 DIAGNOSIS — J449 Chronic obstructive pulmonary disease, unspecified: Secondary | ICD-10-CM | POA: Diagnosis not present

## 2016-05-27 DIAGNOSIS — I25118 Atherosclerotic heart disease of native coronary artery with other forms of angina pectoris: Secondary | ICD-10-CM | POA: Diagnosis not present

## 2016-05-27 DIAGNOSIS — R1311 Dysphagia, oral phase: Secondary | ICD-10-CM | POA: Diagnosis not present

## 2016-05-27 NOTE — Assessment & Plan Note (Signed)
As above.

## 2016-05-27 NOTE — Patient Instructions (Signed)
Keep using the therapy vest at least once a day, twice a day fear feeling more chest congestion Keep taking hypertonic saline twice a day Keep taking Pulmicort twice a day Keep taking the DuoNeb every 6 hours Keep taking prednisone 10 mg daily We will see you back in 4 months or sooner if needed

## 2016-05-27 NOTE — Assessment & Plan Note (Signed)
With recurrent aspiration. Stable interval. Continue speech therapy recommendations.

## 2016-05-27 NOTE — Assessment & Plan Note (Signed)
This is been a very stable interval. He seems to be doing well with his current mucociliary clearance regimen which includes hypertonic saline in the therapy vest. I advised him that he needs to use the therapy vest at least once a day, twice a day. Having more chest congestion.  He was again reminded of the importance of compliance.  He also does well with chronic prednisone despite all of the side effects associated with it. Because we've managed to keep him in the hospital for several months and going to continue at 10 mg daily. If when he returns in 4 months he continues to remain stable then we will lower the dose and plan on the long slow taper  Plan: Continue hypertonic saline twice a day Continue Pulmicort twice a day Continue DuoNeb scheduled Continue therapy vest at least once a day, twice a day of having more symptoms

## 2016-05-27 NOTE — Progress Notes (Signed)
Subjective:    Patient ID: Malik Zuniga, male    DOB: 1945/08/21, 71 y.o.   MRN: [id number]  Synopsis: former patient of Dr. 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   [name] 04/2009:  FEV1 1.65 (47%), FEV1% 49  CT chest 10/2013:  RLL infiltrates CXR 10/2013:  LLL infiltrate +h/o aspiration in the past.  +speech therapy evaluation with recs given.  Admitted multiple times in 2017 for recurrent aspiration pneumonia. Started therapy vest 2017  HPI  Chief Complaint  Patient presents with  . Follow-up    review sleep study.  pt states he is breathing well at this time.    Meghan has been doing really well with his mucocilliary clearance regimen. He says that the hypertonic saline really helps and helps him get rid of the mucus when he takes it.  He says that the nebulizer is working really well (he got a new machine recently).  He is taking the duoneb and pulmicort regularly.  He continues to use his vest once daily, sometimes twice a day.  No fevers or chills.  He has some dyspnea when he moves around, not worse.  He continues to take prednisone daily.   He had his sleep study two weeks ago.   Past Medical History:  Diagnosis Date  . Anemia associated with acute blood loss   . Angioedema 10/31/2010  . Anxiety    takes Valium daily  . Aspiration pneumonia ([place]) 2010  . Asthma   . Bacteremia due to Pseudomonas 10/04/2011   D/t gram neg rods   . Bipolar affective disorder ([place])   . 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 Chronic back pain    compression fracture  . Constipation    takes Colace daily as well as Miralax  . Coronary artery disease   . Depression    takes Cymbalta daily  . Emphysema of lung (HCC)    Albuterol as needed;Symbicort daily and Singulair at bedtime  . GERD (gastroesophageal reflux disease)    takes Omeprazole daily  . Headache, chronic daily   . History of bronchitis   . History of colon polyps   . History of migraine    last migraine a couple of days ago;takes Excedrin Migraine  . History of prostate cancer 2004  . Hyperlipidemia    takes Simvastatin daily  . Hypertension    takes Metoprolol daily as well as Hyzaar  . Insomnia    takes Benadryl nightly  . Lung cancer ([place]) 2016  . Mood change ([place])    after Brain surgery mood changed and was placed on Depakote  . Myocardial infarction ([place]) 04/1998  . Neuromuscular disorder ([employer]) 1998   right carpal tunnel release  . On home O2   . Shortness of breath    with exertion  . Stroke [facility]) 1998   Brain Aneurysm  . Tremor        ROS: Gen: no Fever, chills, sweats HEENT: No sinus congestion, post nasal drip, headache PULM: No cough, dyspnea, chest tightness, wheezing  CV: No chest pain, no leg swelling, no palpitations       Objective:   Physical Exam Vitals:   05/27/16 1036  BP: 136/74  Pulse: 76  SpO2: 93%   RA  Gen: chronically ill appearing, in wheelchair  HENT: OP clear, TM's clear, neck supple PULM: few crackles bases bilaterally B, normal percussion CV: RRR, no mgr, trace edema GI: BS+, soft, nontender Derm: no cyanosis or rash Psyche: normal mood and affect      CBC    Component Value Date/Time   WBC 13.5 (H) 01/24/2016 0538   RBC 4.45 01/24/2016 0538   HGB 13.2 01/24/2016 0538   HCT 39.4 01/24/2016 0538   PLT 238 01/24/2016 0538   PLT 329 12/10/2009   MCV 88.5 01/24/2016 0538   MCH 29.7 01/24/2016 0538   MCHC 33.5 01/24/2016 0538   RDW 16.6 (H) 01/24/2016 0538   LYMPHSABS 1.0 01/23/2016 0543   MONOABS 0.3 01/23/2016 0543   EOSABS 0.0 01/23/2016 0543   BASOSABS 0.0 01/23/2016 0543   April 2018 sleep study documents reviewed were his mean oxygen saturation while asleep was around 91%, he did have some levels as low as 77%      Assessment & Plan:  Dysphagia With recurrent  aspiration. Stable interval. Continue speech therapy recommendations.  Bronchiectasis without acute exacerbation ([place]) This is been a very stable interval. He seems to be doing well with his current mucociliary clearance regimen which includes hypertonic saline in the therapy vest. I advised him that he needs to use the therapy vest at least once a day, twice a day. Having more chest congestion.  He was again reminded of the importance of compliance.  He also does well with chronic prednisone despite all of the side effects associated with it. Because we've managed to keep him in the hospital for several months and going to continue at 10 mg daily. If when he returns in 4 months he continues to remain stable then we will lower the dose and plan on the long slow taper  Plan: Continue hypertonic saline twice a day Continue Pulmicort twice a day Continue DuoNeb scheduled Continue therapy vest at least once a day, twice a day of having more symptoms  COPD (chronic obstructive pulmonary disease) ([place]) As above    Current Outpatient Prescriptions:  .  ARIPiprazole (ABILIFY) 10 MG tablet, Take 5 mg by mouth daily. , Disp: , Rfl:  .  atorvastatin (LIPITOR) 20 MG tablet, Take 1 tablet (20 mg total) by mouth daily at 6 PM., Disp: 90 tablet, Rfl: 1 .  budesonide (PULMICORT) 0.25 MG/2ML nebulizer solution, Take 4 mLs (0.5 mg total) by nebulization 2 (two) times daily., Disp: 60 mL, Rfl: 3 .  cholecalciferol (VITAMIN D) 1000 UNITS tablet, Take 1,000 Units by mouth every morning. , Disp: , Rfl:  .  cyclobenzaprine (FLEXERIL) 5 MG tablet, Take 1 tablet (5 mg total) by mouth 3 (three) times daily as needed for muscle spasms., Disp: 20 tablet, Rfl: 0 .  dabigatran (PRADAXA) 150 MG CAPS capsule, Take 1 capsule (150 mg total) by mouth every 12 (twelve) hours., Disp: 180 capsule, Rfl: 3 .  diazepam (VALIUM) 5 MG tablet, Take 1 tablet (5 mg total) by mouth every 8 (eight) hours as needed for anxiety or muscle  spasms. scheduled, Disp: 30 tablet, Rfl: 0 .  diltiazem (CARDIZEM CD) 240 MG 24 hr capsule, TAKE 1 CAPSULE EVERY DAY, Disp: 30 capsule, Rfl: 4 .  divalproex (DEPAKOTE) 500 MG 24 hr tablet, Take 1,000 mg by mouth at bedtime. , Disp: , Rfl:  .  docusate sodium (COLACE) 100 MG capsule, Take 200 mg by mouth at bedtime. , Disp: , Rfl:  .  DULoxetine (CYMBALTA) 60 MG capsule, Take 120 mg by  mouth daily. , Disp: , Rfl:  .  feeding supplement, ENSURE ENLIVE, (ENSURE ENLIVE) LIQD, Take 237 mLs by mouth daily., Disp: , Rfl:  .  fluticasone (FLONASE) 50 MCG/ACT nasal spray, Place 2 sprays into both nostrils daily as needed for allergies or rhinitis., Disp: , Rfl:  .  HYDROcodone-acetaminophen (NORCO) 10-325 MG tablet, Take 1-2 tablets by mouth every 4 (four) hours as needed for pain., Disp: , Rfl: 0 .  ipratropium-albuterol (DUONEB) 0.5-2.5 (3) MG/3ML SOLN, Take 3 mLs by nebulization every 6 (six) hours., Disp: 360 mL, Rfl: 3 .  Maltodextrin-Xanthan Gum (RESOURCE THICKENUP CLEAR) POWD, Take 1 g by mouth as needed., Disp: 10 Can, Rfl: 3 .  metoprolol (LOPRESSOR) 50 MG tablet, Take 1 tablet (50 mg total) by mouth 2 (two) times daily., Disp: 180 tablet, Rfl: 3 .  montelukast (SINGULAIR) 10 MG tablet, TAKE 1 TABLET AT BEDTIME, Disp: 90 tablet, Rfl: 1 .  nitroGLYCERIN (NITROSTAT) 0.4 MG SL tablet, PLACE 1 TABLET (0.4 MG TOTAL) UNDER THE TONGUE EVERY 5 (FIVE) MINUTES AS NEEDED FOR CHEST PAIN., Disp: 25 tablet, Rfl: 2 .  omeprazole (PRILOSEC) 40 MG capsule, Take 40 mg by mouth 2 (two) times daily. , Disp: , Rfl:  .  predniSONE (DELTASONE) 10 MG tablet, Take 1 tablet (10 mg total) by mouth daily with breakfast., Disp: 30 tablet, Rfl: 0 .  primidone (MYSOLINE) 250 MG tablet, TAKE 1 TABLET TWICE DAILY, Disp: 180 tablet, Rfl: 1 .  promethazine (PHENERGAN) 25 MG tablet, Take one (1) tablet by mouth every 6 to 8 hours as needed for nausea., Disp: , Rfl: 6 .  ranitidine (ZANTAC) 150 MG tablet, Take 150-300 mg by mouth 2  (two) times daily as needed for heartburn., Disp: , Rfl:  .  sodium chloride HYPERTONIC 3 % nebulizer solution, Take by nebulization 2 (two) times daily., Disp: 750 mL, Rfl: 12 .  tiZANidine (ZANAFLEX) 4 MG tablet, TAKE 1 TABLET BY MOUTH EVERY 8 HOURS AS NEEDED FOR MUSCLE SPASMS., Disp: 90 tablet, Rfl: 0

## 2016-05-28 ENCOUNTER — Other Ambulatory Visit: Payer: Self-pay | Admitting: Pulmonary Disease

## 2016-05-29 ENCOUNTER — Ambulatory Visit: Payer: Medicare Other | Admitting: Pulmonary Disease

## 2016-06-04 DIAGNOSIS — Q796 Ehlers-Danlos syndrome: Secondary | ICD-10-CM | POA: Diagnosis not present

## 2016-06-04 DIAGNOSIS — G4710 Hypersomnia, unspecified: Secondary | ICD-10-CM | POA: Diagnosis not present

## 2016-06-04 NOTE — Procedures (Signed)
Patient Name: Malik Zuniga, Malik Zuniga Date: 05/22/2016 Gender: Male D.O.B: [birth year] Age (years): 7 Referring Provider: Simonne Maffucci Height (inches): 61 Interpreting Physician: Kara Mead MD, ABSM Weight (lbs): 165 RPSGT: Laren Everts BMI: 24 MRN: [id number] Neck Size: 15.50   CLINICAL INFORMATION Sleep Study Type: NPSG  Indication for sleep study: COPD, Excessive Daytime Sleepiness, Fatigue, Hypertension, Morning Headaches, Sleep walking/talking/parasomnias  Epworth Sleepiness Score: 13  SLEEP STUDY TECHNIQUE As per the AASM Manual for the Scoring of Sleep and Associated Events v2.3 (April 2016) with a hypopnea requiring 4% desaturations.  The channels recorded and monitored were frontal, central and occipital EEG, electrooculogram (EOG), submentalis EMG (chin), nasal and oral airflow, thoracic and abdominal wall motion, anterior tibialis EMG, snore microphone, electrocardiogram, and pulse oximetry.  MEDICATIONS Medications self-administered by patient taken the night of the study : DIAZEPAM, EQUATE SLEEP AID, NORCO, EQUATE HEADACHE RELIEF  SLEEP ARCHITECTURE The study was initiated at 9:35:33 PM and ended at 5:26:22 AM.  Sleep onset time was 32.2 minutes and the sleep efficiency was 45.2%. The total sleep time was 212.6 minutes.  Stage REM latency was 336.5 minutes.  The patient spent 19.52% of the night in stage N1 sleep, 73.19% in stage N2 sleep, 0.00% in stage N3 and 7.29% in REM.  Alpha intrusion was absent.  Supine sleep was 61.84%.  RESPIRATORY PARAMETERS The overall apnea/hypopnea index (AHI) was 0.3 per hour. There were 1 total apneas, including 0 obstructive, 1 central and 0 mixed apneas. There were 0 hypopneas and 3 RERAs.  The AHI during Stage REM sleep was 0.0 per hour.  AHI while supine was 0.0 per hour.  The mean oxygen saturation was 90.39%. The minimum SpO2 during sleep was 85.00%.  Soft snoring was noted during this study.  CARDIAC  DATA The 2 lead EKG demonstrated sinus rhythm. The mean heart rate was 69.00 beats per minute. Other EKG findings include: None.   LEG MOVEMENT DATA The total PLMS were 0 with a resulting PLMS index of 0.00. Associated arousal with leg movement index was 0.0 .  IMPRESSIONS - No significant obstructive sleep apnea occurred during this study (AHI = 0.3/h). - No significant central sleep apnea occurred during this study (CAI = 0.3/h). - Moderate oxygen desaturation was noted during this study (Min O2 = 85.00%). - The patient snored with Soft snoring volume. - No cardiac abnormalities were noted during this study. - Clinically significant periodic limb movements did not occur during sleep. No significant associated arousals.   DIAGNOSIS - Nocturnal Hypoxemia (327.26 [G47.36 ICD-10]) - Excessive daytime sleepiness may be related to medications   RECOMMENDATIONS - Avoid alcohol, sedatives and other CNS depressants that may disrupt normal sleep architecture. - Consider oxygen therapy during sleep. - Sleep hygiene should be reviewed to assess factors that may improve sleep quality. - Weight management and regular exercise should be initiated or continued if appropriate.    Kara Mead MD Board Certified in [place]

## 2016-06-19 ENCOUNTER — Telehealth: Payer: Self-pay

## 2016-06-19 DIAGNOSIS — S22000D Wedge compression fracture of unspecified thoracic vertebra, subsequent encounter for fracture with routine healing: Secondary | ICD-10-CM | POA: Diagnosis not present

## 2016-06-19 NOTE — Telephone Encounter (Signed)
Spoke with pt, aware of recs.  Pt wears 2lpm qhs already, advised to continue wearing this.  Nothing further needed.

## 2016-06-19 NOTE — Telephone Encounter (Signed)
-----   Message from [name], MD sent at 06/16/2016 12:12 PM EDT ----- A, Please let him know his sleep study was normal except he needs oxygen when he sleeps. If he does not have this already he needs 2L qHS with an ONO. Thanks, Ruby Cola

## 2016-06-23 DIAGNOSIS — I482 Chronic atrial fibrillation: Secondary | ICD-10-CM | POA: Diagnosis not present

## 2016-06-23 DIAGNOSIS — J449 Chronic obstructive pulmonary disease, unspecified: Secondary | ICD-10-CM | POA: Diagnosis not present

## 2016-06-23 DIAGNOSIS — H02403 Unspecified ptosis of bilateral eyelids: Secondary | ICD-10-CM | POA: Diagnosis not present

## 2016-06-23 DIAGNOSIS — Z8673 Personal history of transient ischemic attack (TIA), and cerebral infarction without residual deficits: Secondary | ICD-10-CM | POA: Diagnosis not present

## 2016-06-23 DIAGNOSIS — H02831 Dermatochalasis of right upper eyelid: Secondary | ICD-10-CM | POA: Diagnosis not present

## 2016-06-23 DIAGNOSIS — Z7901 Long term (current) use of anticoagulants: Secondary | ICD-10-CM | POA: Diagnosis not present

## 2016-06-23 DIAGNOSIS — Z993 Dependence on wheelchair: Secondary | ICD-10-CM | POA: Diagnosis not present

## 2016-06-23 DIAGNOSIS — H02532 Eyelid retraction right lower eyelid: Secondary | ICD-10-CM | POA: Diagnosis not present

## 2016-06-23 DIAGNOSIS — G510 Bell's palsy: Secondary | ICD-10-CM | POA: Diagnosis not present

## 2016-06-23 DIAGNOSIS — H02535 Eyelid retraction left lower eyelid: Secondary | ICD-10-CM | POA: Diagnosis not present

## 2016-06-23 DIAGNOSIS — H02834 Dermatochalasis of left upper eyelid: Secondary | ICD-10-CM | POA: Diagnosis not present

## 2016-06-26 ENCOUNTER — Encounter: Payer: Self-pay | Admitting: Family Medicine

## 2016-06-26 DIAGNOSIS — J449 Chronic obstructive pulmonary disease, unspecified: Secondary | ICD-10-CM | POA: Diagnosis not present

## 2016-06-26 DIAGNOSIS — Z515 Encounter for palliative care: Secondary | ICD-10-CM | POA: Diagnosis not present

## 2016-06-26 DIAGNOSIS — R1310 Dysphagia, unspecified: Secondary | ICD-10-CM | POA: Diagnosis not present

## 2016-06-26 DIAGNOSIS — C3431 Malignant neoplasm of lower lobe, right bronchus or lung: Secondary | ICD-10-CM | POA: Diagnosis not present

## 2016-06-26 DIAGNOSIS — I2510 Atherosclerotic heart disease of native coronary artery without angina pectoris: Secondary | ICD-10-CM | POA: Diagnosis not present

## 2016-07-02 ENCOUNTER — Ambulatory Visit: Payer: Medicare Other | Admitting: Physical Therapy

## 2016-07-03 ENCOUNTER — Ambulatory Visit: Payer: Medicare Other | Admitting: Family Medicine

## 2016-07-03 ENCOUNTER — Telehealth: Payer: Self-pay | Admitting: Family Medicine

## 2016-07-03 DIAGNOSIS — M546 Pain in thoracic spine: Secondary | ICD-10-CM

## 2016-07-03 NOTE — Telephone Encounter (Signed)
Wife was here for her appt today and indicates that pt is having severe R sided back pain, 'like a rib fracture or something'.  He is having a very difficult time turning in his wheelchair or rolling over.  Asking for Xray at [facility].  Order entered.

## 2016-07-12 ENCOUNTER — Other Ambulatory Visit: Payer: Self-pay | Admitting: Interventional Cardiology

## 2016-07-14 ENCOUNTER — Other Ambulatory Visit: Payer: Self-pay | Admitting: Interventional Cardiology

## 2016-07-15 NOTE — Telephone Encounter (Signed)
Medication Detail    Disp Refills Start End   diltiazem (CARDIZEM CD) 240 MG 24 hr capsule 30 capsule 9 07/14/2016    Sig - Route: Take 1 capsule (240 mg total) by mouth daily. - Oral   E-Prescribing Status: Receipt confirmed by pharmacy (07/14/2016 9:19 AM EDT)   Pharmacy   [id number] - [place], [place]

## 2016-07-16 ENCOUNTER — Other Ambulatory Visit (HOSPITAL_COMMUNITY): Payer: Self-pay | Admitting: Psychiatry

## 2016-07-25 ENCOUNTER — Encounter: Payer: Self-pay | Admitting: Pulmonary Disease

## 2016-07-28 ENCOUNTER — Other Ambulatory Visit: Payer: Self-pay | Admitting: Family Medicine

## 2016-07-28 ENCOUNTER — Inpatient Hospital Stay (HOSPITAL_COMMUNITY)
Admission: EM | Admit: 2016-07-28 | Discharge: 2016-07-30 | DRG: 871 | Disposition: A | Discharge status: Home / Self Care | Payer: Medicare Other | Attending: Internal Medicine | Admitting: Internal Medicine

## 2016-07-28 ENCOUNTER — Encounter (HOSPITAL_COMMUNITY): Payer: Self-pay

## 2016-07-28 ENCOUNTER — Emergency Department (HOSPITAL_COMMUNITY): Payer: Medicare Other

## 2016-07-28 DIAGNOSIS — Z66 Do not resuscitate: Secondary | ICD-10-CM | POA: Diagnosis present

## 2016-07-28 DIAGNOSIS — Z955 Presence of coronary angioplasty implant and graft: Secondary | ICD-10-CM | POA: Diagnosis not present

## 2016-07-28 DIAGNOSIS — K219 Gastro-esophageal reflux disease without esophagitis: Secondary | ICD-10-CM | POA: Diagnosis present

## 2016-07-28 DIAGNOSIS — I480 Paroxysmal atrial fibrillation: Secondary | ICD-10-CM | POA: Diagnosis present

## 2016-07-28 DIAGNOSIS — J470 Bronchiectasis with acute lower respiratory infection: Secondary | ICD-10-CM | POA: Diagnosis not present

## 2016-07-28 DIAGNOSIS — J479 Bronchiectasis, uncomplicated: Secondary | ICD-10-CM

## 2016-07-28 DIAGNOSIS — I5040 Unspecified combined systolic (congestive) and diastolic (congestive) heart failure: Secondary | ICD-10-CM | POA: Insufficient documentation

## 2016-07-28 DIAGNOSIS — F419 Anxiety disorder, unspecified: Secondary | ICD-10-CM | POA: Diagnosis present

## 2016-07-28 DIAGNOSIS — C3431 Malignant neoplasm of lower lobe, right bronchus or lung: Secondary | ICD-10-CM | POA: Diagnosis present

## 2016-07-28 DIAGNOSIS — Z87891 Personal history of nicotine dependence: Secondary | ICD-10-CM | POA: Diagnosis not present

## 2016-07-28 DIAGNOSIS — I10 Essential (primary) hypertension: Secondary | ICD-10-CM

## 2016-07-28 DIAGNOSIS — R6520 Severe sepsis without septic shock: Secondary | ICD-10-CM | POA: Diagnosis not present

## 2016-07-28 DIAGNOSIS — R0781 Pleurodynia: Secondary | ICD-10-CM | POA: Diagnosis not present

## 2016-07-28 DIAGNOSIS — F319 Bipolar disorder, unspecified: Secondary | ICD-10-CM | POA: Diagnosis present

## 2016-07-28 DIAGNOSIS — Z993 Dependence on wheelchair: Secondary | ICD-10-CM | POA: Diagnosis not present

## 2016-07-28 DIAGNOSIS — Z7901 Long term (current) use of anticoagulants: Secondary | ICD-10-CM

## 2016-07-28 DIAGNOSIS — J9621 Acute and chronic respiratory failure with hypoxia: Secondary | ICD-10-CM | POA: Diagnosis present

## 2016-07-28 DIAGNOSIS — E785 Hyperlipidemia, unspecified: Secondary | ICD-10-CM | POA: Diagnosis present

## 2016-07-28 DIAGNOSIS — I2510 Atherosclerotic heart disease of native coronary artery without angina pectoris: Secondary | ICD-10-CM | POA: Diagnosis present

## 2016-07-28 DIAGNOSIS — I959 Hypotension, unspecified: Secondary | ICD-10-CM | POA: Diagnosis present

## 2016-07-28 DIAGNOSIS — R0602 Shortness of breath: Secondary | ICD-10-CM | POA: Diagnosis not present

## 2016-07-28 DIAGNOSIS — Z7952 Long term (current) use of systemic steroids: Secondary | ICD-10-CM | POA: Diagnosis not present

## 2016-07-28 DIAGNOSIS — I252 Old myocardial infarction: Secondary | ICD-10-CM | POA: Diagnosis not present

## 2016-07-28 DIAGNOSIS — R031 Nonspecific low blood-pressure reading: Secondary | ICD-10-CM | POA: Diagnosis not present

## 2016-07-28 DIAGNOSIS — F310 Bipolar disorder, current episode hypomanic: Secondary | ICD-10-CM | POA: Diagnosis not present

## 2016-07-28 DIAGNOSIS — N179 Acute kidney failure, unspecified: Secondary | ICD-10-CM | POA: Diagnosis present

## 2016-07-28 DIAGNOSIS — I5042 Chronic combined systolic (congestive) and diastolic (congestive) heart failure: Secondary | ICD-10-CM | POA: Diagnosis present

## 2016-07-28 DIAGNOSIS — A419 Sepsis, unspecified organism: Secondary | ICD-10-CM | POA: Diagnosis not present

## 2016-07-28 DIAGNOSIS — J690 Pneumonitis due to inhalation of food and vomit: Secondary | ICD-10-CM | POA: Diagnosis present

## 2016-07-28 DIAGNOSIS — J449 Chronic obstructive pulmonary disease, unspecified: Secondary | ICD-10-CM | POA: Diagnosis not present

## 2016-07-28 DIAGNOSIS — I110 Hypertensive heart disease with heart failure: Secondary | ICD-10-CM | POA: Diagnosis not present

## 2016-07-28 DIAGNOSIS — Z79899 Other long term (current) drug therapy: Secondary | ICD-10-CM

## 2016-07-28 DIAGNOSIS — Z8673 Personal history of transient ischemic attack (TIA), and cerebral infarction without residual deficits: Secondary | ICD-10-CM | POA: Diagnosis not present

## 2016-07-28 DIAGNOSIS — F329 Major depressive disorder, single episode, unspecified: Secondary | ICD-10-CM | POA: Diagnosis present

## 2016-07-28 DIAGNOSIS — R079 Chest pain, unspecified: Secondary | ICD-10-CM | POA: Diagnosis present

## 2016-07-28 DIAGNOSIS — I672 Cerebral atherosclerosis: Secondary | ICD-10-CM | POA: Diagnosis not present

## 2016-07-28 DIAGNOSIS — R0902 Hypoxemia: Secondary | ICD-10-CM

## 2016-07-28 DIAGNOSIS — Z7951 Long term (current) use of inhaled steroids: Secondary | ICD-10-CM

## 2016-07-28 DIAGNOSIS — I25118 Atherosclerotic heart disease of native coronary artery with other forms of angina pectoris: Secondary | ICD-10-CM | POA: Diagnosis not present

## 2016-07-28 LAB — PROTIME-INR
INR: 1.25
PROTHROMBIN TIME: 15.8 s — AB (ref 11.4–15.2)

## 2016-07-28 LAB — URINALYSIS, ROUTINE W REFLEX MICROSCOPIC
BILIRUBIN URINE: NEGATIVE
Glucose, UA: NEGATIVE mg/dL
HGB URINE DIPSTICK: NEGATIVE
Ketones, ur: NEGATIVE mg/dL
Leukocytes, UA: NEGATIVE
Nitrite: NEGATIVE
PH: 5 (ref 5.0–8.0)
Protein, ur: NEGATIVE mg/dL
SPECIFIC GRAVITY, URINE: 1.019 (ref 1.005–1.030)

## 2016-07-28 LAB — COMPREHENSIVE METABOLIC PANEL
ALT: 39 U/L (ref 17–63)
ANION GAP: 7 (ref 5–15)
AST: 40 U/L (ref 15–41)
Albumin: 2.8 g/dL — ABNORMAL LOW (ref 3.5–5.0)
Alkaline Phosphatase: 37 U/L — ABNORMAL LOW (ref 38–126)
BUN: 28 mg/dL — ABNORMAL HIGH (ref 6–20)
CHLORIDE: 106 mmol/L (ref 101–111)
CO2: 26 mmol/L (ref 22–32)
CREATININE: 1.67 mg/dL — AB (ref 0.61–1.24)
Calcium: 7.6 mg/dL — ABNORMAL LOW (ref 8.9–10.3)
GFR calc non Af Amer: 40 mL/min — ABNORMAL LOW (ref >60)
GFR, EST AFRICAN AMERICAN: 46 mL/min — AB (ref >60)
Glucose, Bld: 80 mg/dL (ref 65–99)
Potassium: 4.9 mmol/L (ref 3.5–5.1)
SODIUM: 139 mmol/L (ref 135–145)
Total Bilirubin: 0.5 mg/dL (ref 0.3–1.2)
Total Protein: 5.2 g/dL — ABNORMAL LOW (ref 6.5–8.1)

## 2016-07-28 LAB — APTT: APTT: 43 s — AB (ref 24–36)

## 2016-07-28 LAB — CBC WITH DIFFERENTIAL/PLATELET
Basophils Absolute: 0 10*3/uL (ref 0.0–0.1)
Basophils Relative: 0 %
Eosinophils Absolute: 0 10*3/uL (ref 0.0–0.7)
Eosinophils Relative: 0 %
HEMATOCRIT: 33.5 % — AB (ref 39.0–52.0)
HEMOGLOBIN: 10.9 g/dL — AB (ref 13.0–17.0)
LYMPHS ABS: 1.5 10*3/uL (ref 0.7–4.0)
Lymphocytes Relative: 11 %
MCH: 31.2 pg (ref 26.0–34.0)
MCHC: 32.5 g/dL (ref 30.0–36.0)
MCV: 96 fL (ref 78.0–100.0)
MONOS PCT: 7 %
Monocytes Absolute: 0.9 10*3/uL (ref 0.1–1.0)
NEUTROS ABS: 11 10*3/uL — AB (ref 1.7–7.7)
NEUTROS PCT: 82 %
Platelets: 190 10*3/uL (ref 150–400)
RBC: 3.49 MIL/uL — ABNORMAL LOW (ref 4.22–5.81)
RDW: 14.5 % (ref 11.5–15.5)
WBC: 13.4 10*3/uL — ABNORMAL HIGH (ref 4.0–10.5)

## 2016-07-28 LAB — I-STAT TROPONIN, ED: Troponin i, poc: 0.02 ng/mL (ref 0.00–0.08)

## 2016-07-28 LAB — I-STAT CG4 LACTIC ACID, ED: LACTIC ACID, VENOUS: 0.92 mmol/L (ref 0.5–1.9)

## 2016-07-28 LAB — PROCALCITONIN: Procalcitonin: 4.04 ng/mL

## 2016-07-28 LAB — MRSA PCR SCREENING: MRSA by PCR: NEGATIVE

## 2016-07-28 LAB — TROPONIN I

## 2016-07-28 LAB — LACTIC ACID, PLASMA: Lactic Acid, Venous: 1.2 mmol/L (ref 0.5–1.9)

## 2016-07-28 MED ORDER — ATORVASTATIN CALCIUM 20 MG PO TABS
20.0000 mg | ORAL_TABLET | Freq: Every day | ORAL | Status: DC
  Administered 2016-07-29: 20 mg via ORAL
  Filled 2016-07-28: qty 1

## 2016-07-28 MED ORDER — HYDROCODONE-ACETAMINOPHEN 5-325 MG PO TABS
1.0000 | ORAL_TABLET | ORAL | Status: DC | PRN
  Administered 2016-07-28 – 2016-07-30 (×2): 2 via ORAL
  Filled 2016-07-28: qty 1
  Filled 2016-07-28: qty 2
  Filled 2016-07-28: qty 1
  Filled 2016-07-28: qty 2

## 2016-07-28 MED ORDER — ACETAMINOPHEN 650 MG RE SUPP: 650.0000 mg | Freq: Four times a day (QID) | RECTAL | Status: DC | PRN

## 2016-07-28 MED ORDER — ALBUTEROL SULFATE (2.5 MG/3ML) 0.083% IN NEBU: 2.5000 mg | INHALATION_SOLUTION | RESPIRATORY_TRACT | Status: DC | PRN

## 2016-07-28 MED ORDER — DIAZEPAM 5 MG PO TABS: 5.0000 mg | ORAL_TABLET | Freq: Three times a day (TID) | ORAL | Status: DC | PRN

## 2016-07-28 MED ORDER — MONTELUKAST SODIUM 10 MG PO TABS
10.0000 mg | ORAL_TABLET | Freq: Every day | ORAL | Status: DC
  Administered 2016-07-28 – 2016-07-29 (×2): 10 mg via ORAL
  Filled 2016-07-28 (×2): qty 1

## 2016-07-28 MED ORDER — FAMOTIDINE 20 MG PO TABS
10.0000 mg | ORAL_TABLET | Freq: Two times a day (BID) | ORAL | Status: DC
  Administered 2016-07-28 – 2016-07-30 (×4): 10 mg via ORAL
  Filled 2016-07-28 (×4): qty 1

## 2016-07-28 MED ORDER — ACETAMINOPHEN 325 MG PO TABS: 650.0000 mg | ORAL_TABLET | Freq: Four times a day (QID) | ORAL | Status: DC | PRN

## 2016-07-28 MED ORDER — BUDESONIDE 0.25 MG/2ML IN SUSP
0.5000 mg | Freq: Two times a day (BID) | RESPIRATORY_TRACT | Status: DC
  Filled 2016-07-28: qty 4

## 2016-07-28 MED ORDER — DULOXETINE HCL 60 MG PO CPEP
120.0000 mg | ORAL_CAPSULE | Freq: Every day | ORAL | Status: DC
  Administered 2016-07-29 – 2016-07-30 (×2): 120 mg via ORAL
  Filled 2016-07-28: qty 4
  Filled 2016-07-28: qty 2

## 2016-07-28 MED ORDER — HYDROCORTISONE NA SUCCINATE PF 100 MG IJ SOLR
100.0000 mg | Freq: Once | INTRAMUSCULAR | Status: AC
  Administered 2016-07-28: 100 mg via INTRAVENOUS
  Filled 2016-07-28: qty 2

## 2016-07-28 MED ORDER — TIZANIDINE HCL 4 MG PO TABS: 4.0000 mg | ORAL_TABLET | Freq: Three times a day (TID) | ORAL | Status: DC | PRN

## 2016-07-28 MED ORDER — SODIUM CHLORIDE 0.9% FLUSH
3.0000 mL | Freq: Two times a day (BID) | INTRAVENOUS | Status: DC
  Administered 2016-07-28 – 2016-07-30 (×4): 3 mL via INTRAVENOUS

## 2016-07-28 MED ORDER — PANTOPRAZOLE SODIUM 40 MG PO TBEC
40.0000 mg | DELAYED_RELEASE_TABLET | Freq: Every day | ORAL | Status: DC
  Administered 2016-07-28 – 2016-07-30 (×3): 40 mg via ORAL
  Filled 2016-07-28 (×3): qty 1

## 2016-07-28 MED ORDER — DABIGATRAN ETEXILATE MESYLATE 150 MG PO CAPS
150.0000 mg | ORAL_CAPSULE | Freq: Two times a day (BID) | ORAL | Status: DC
  Administered 2016-07-28 – 2016-07-30 (×4): 150 mg via ORAL
  Filled 2016-07-28 (×5): qty 1

## 2016-07-28 MED ORDER — BUDESONIDE 0.5 MG/2ML IN SUSP
0.5000 mg | Freq: Two times a day (BID) | RESPIRATORY_TRACT | Status: DC
  Administered 2016-07-28 – 2016-07-30 (×4): 0.5 mg via RESPIRATORY_TRACT
  Filled 2016-07-28 (×3): qty 2

## 2016-07-28 MED ORDER — HYDROCORTISONE NA SUCCINATE PF 100 MG IJ SOLR
100.0000 mg | Freq: Three times a day (TID) | INTRAMUSCULAR | Status: DC
  Administered 2016-07-29 (×2): 100 mg via INTRAVENOUS
  Filled 2016-07-28 (×2): qty 2

## 2016-07-28 MED ORDER — VANCOMYCIN HCL IN DEXTROSE 1-5 GM/200ML-% IV SOLN
1000.0000 mg | Freq: Once | INTRAVENOUS | Status: AC
  Administered 2016-07-28: 1000 mg via INTRAVENOUS
  Filled 2016-07-28: qty 200

## 2016-07-28 MED ORDER — ONDANSETRON HCL 4 MG/2ML IJ SOLN: 4.0000 mg | Freq: Four times a day (QID) | INTRAMUSCULAR | Status: DC | PRN

## 2016-07-28 MED ORDER — SODIUM CHLORIDE 0.9 % IV BOLUS (SEPSIS)
1000.0000 mL | Freq: Once | INTRAVENOUS | Status: AC
  Administered 2016-07-28: 1000 mL via INTRAVENOUS

## 2016-07-28 MED ORDER — DIVALPROEX SODIUM ER 500 MG PO TB24
1000.0000 mg | ORAL_TABLET | Freq: Every day | ORAL | Status: DC
  Administered 2016-07-28 – 2016-07-29 (×2): 1000 mg via ORAL
  Filled 2016-07-28: qty 4
  Filled 2016-07-28: qty 2

## 2016-07-28 MED ORDER — GUAIFENESIN ER 600 MG PO TB12
600.0000 mg | ORAL_TABLET | Freq: Two times a day (BID) | ORAL | Status: DC
  Administered 2016-07-28 – 2016-07-30 (×4): 600 mg via ORAL
  Filled 2016-07-28 (×4): qty 1

## 2016-07-28 MED ORDER — SODIUM CHLORIDE 0.9 % IV SOLN
INTRAVENOUS | Status: AC
  Administered 2016-07-28: 22:00:00 via INTRAVENOUS

## 2016-07-28 MED ORDER — LORATADINE 10 MG PO TABS
10.0000 mg | ORAL_TABLET | Freq: Every day | ORAL | Status: DC
  Administered 2016-07-29 – 2016-07-30 (×2): 10 mg via ORAL
  Filled 2016-07-28 (×2): qty 1

## 2016-07-28 MED ORDER — ENSURE ENLIVE PO LIQD
237.0000 mL | Freq: Every day | ORAL | Status: DC
  Administered 2016-07-30: 237 mL via ORAL

## 2016-07-28 MED ORDER — VANCOMYCIN HCL IN DEXTROSE 750-5 MG/150ML-% IV SOLN
750.0000 mg | Freq: Two times a day (BID) | INTRAVENOUS | Status: DC
  Filled 2016-07-28 (×2): qty 150

## 2016-07-28 MED ORDER — SODIUM CHLORIDE 0.9 % IV BOLUS (SEPSIS)
250.0000 mL | Freq: Once | INTRAVENOUS | Status: AC
  Administered 2016-07-28: 250 mL via INTRAVENOUS

## 2016-07-28 MED ORDER — PIPERACILLIN-TAZOBACTAM 3.375 G IVPB 30 MIN
3.3750 g | Freq: Once | INTRAVENOUS | Status: AC
Start: 2016-07-28 — End: 2016-07-28
  Administered 2016-07-28: 3.375 g via INTRAVENOUS
  Filled 2016-07-28: qty 50

## 2016-07-28 MED ORDER — PIPERACILLIN-TAZOBACTAM 3.375 G IVPB
3.3750 g | Freq: Three times a day (TID) | INTRAVENOUS | Status: DC
  Administered 2016-07-29 – 2016-07-30 (×5): 3.375 g via INTRAVENOUS
  Filled 2016-07-28 (×6): qty 50

## 2016-07-28 MED ORDER — ONDANSETRON HCL 4 MG PO TABS: 4.0000 mg | ORAL_TABLET | Freq: Four times a day (QID) | ORAL | Status: DC | PRN

## 2016-07-28 MED ORDER — PRIMIDONE 250 MG PO TABS
250.0000 mg | ORAL_TABLET | Freq: Two times a day (BID) | ORAL | Status: DC
  Administered 2016-07-28 – 2016-07-30 (×4): 250 mg via ORAL
  Filled 2016-07-28 (×5): qty 1

## 2016-07-28 MED ORDER — IPRATROPIUM-ALBUTEROL 0.5-2.5 (3) MG/3ML IN SOLN
3.0000 mL | Freq: Four times a day (QID) | RESPIRATORY_TRACT | Status: DC
  Administered 2016-07-28: 3 mL via RESPIRATORY_TRACT
  Filled 2016-07-28: qty 3

## 2016-07-28 NOTE — ED Triage Notes (Signed)
Per EMS- Patient is from home and wears O2 2 1/2 L/min via [place]. EMS was notified by Hospice nurse that the patient was hypotensive. EMS gave 1500 ml NS prior to arrival to the ED. Initial BP-65/40. BP on arival to the ED-65/48.

## 2016-07-28 NOTE — ED Provider Notes (Signed)
Emergency Department Provider Note   I have reviewed the triage vital signs and the nursing notes.   HISTORY  Chief Complaint Hypotension   HPI Malik Zuniga is a 71 y.o. male with PMH of multiple episodes of aspiration PNA, prior CVA, GERD, lung cancer not on chemotherapy, and CAD presents to the emergency department for evaluation of sudden onset fatigue, confusion, and low blood pressure at home. The patient has a palliative care nurse who comes several times a week to check on the patient. He is not in hospice. He noticed that he was more confused and very fatigued. She was unable to obtain a blood pressure on several occasions but ultimately did and found to be very low. EMS was called and the patient was found to be hypotensive into the 60s. He was given 1500 mL of fluid prior to ED presentation where he remained hypotensive. The patient denies any difficulty breathing. He did have some sharp right-sided chest pain last week and today has developed a similar pain but on left side. No productive cough. No abdominal discomfort, nausea, vomiting, diarrhea. Family state that he was normal this morning and cheerful. No radiation of symptoms. Patient is on chronic Prednisone with no tapering recently.    Past Medical History:  Diagnosis Date  . Anemia associated with acute blood loss   . Angioedema 10/31/2010  . Anxiety    takes Valium daily  . Aspiration pneumonia ([place]) 2010  . Asthma   . Bacteremia due to Pseudomonas 10/04/2011   D/t gram neg rods   . Bipolar affective disorder ([place])   . 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 Chronic back pain    compression fracture  . Constipation    takes Colace daily as well as Miralax  . Coronary artery disease   . Depression    takes Cymbalta daily  . Emphysema of lung (HCC)    Albuterol as needed;Symbicort daily and Singulair at bedtime  . GERD (gastroesophageal reflux disease)    takes Omeprazole  daily  . Headache, chronic daily   . History of bronchitis   . History of colon polyps   . History of migraine    last migraine a couple of days ago;takes Excedrin Migraine  . History of prostate cancer 2004  . Hyperlipidemia    takes Simvastatin daily  . Hypertension    takes Metoprolol daily as well as Hyzaar  . Insomnia    takes Benadryl nightly  . Lung cancer ([place]) 2016  . Mood change ([place])    after Brain surgery mood changed and was placed on Depakote  . Myocardial infarction ([place]) 04/1998  . Neuromuscular disorder ([place]) 1998   right carpal tunnel release  . On home O2   . Shortness of breath    with exertion  . 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 Tremor     Patient Active Problem List   Diagnosis Date Noted  . Hypotension 07/28/2016  . Severe sepsis ([place]) 07/28/2016  . Aspiration pneumonia ([place]) 07/28/2016  . 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 Chronic combined systolic (congestive) and diastolic (congestive) heart failure ([place]) 07/28/2016  . Chest pain 07/28/2016  . Chronic anticoagulation 11/01/2015  . Cerebrovascular disease, arteriosclerotic, post-stroke 08/07/2015  . Acute delirium 08/07/2015  . Paroxysmal atrial fibrillation (HCC)   . Diplopia   . COPD (chronic obstructive pulmonary disease) ([place])   .  Bronchiectasis without acute exacerbation ([place]) 08/02/2015  . Atrial flutter with rapid ventricular response ([place]) 08/02/2015  . Tinea cruris 06/05/2015  . Dysphagia 02/01/2015  . Primary cancer of right lower lobe of lung ([place]) 12/05/2014  . CAD (coronary artery disease) 07/27/2013  . Thoracic compression fracture ([place]) 06/21/2013  . Thoracic spine fracture ([place]) 06/21/2013  . Giant comedone 11/04/2012  . Chronic lower back pain 07/22/2012  . Prostate cancer ([place]) 04/22/2012  . 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 Hyperlipidemia 02/13/2010  . Bipolar disorder ([place]) 02/13/2010  . DEPRESSION 02/13/2010  . Essential  hypertension 02/13/2010  . TREMOR 02/13/2010  . Hx of TIA (transient ischemic attack) and stroke 02/13/2010    Past Surgical History:  Procedure Laterality Date  . BACK SURGERY  08/2004; 02/2005; 04/2006; 06/2007; 7/20101   all by Dr. Annette Stable  . BACK SURGERY  05/2013  . BRAIN SURGERY  1999   clip aneurysm  . [place]   right  . CHOLECYSTECTOMY  1996  . COLONOSCOPY    . CORONARY ANGIOPLASTY WITH STENT PLACEMENT  2000   RCA stent, Dr. Rollene Fare; denies stent for PAD 06/15/13  . CORONARY ANGIOPLASTY WITH STENT PLACEMENT  04/1998  . CRANIOTOMY  1999   to clip aneurism, Dr. Annette Stable  . DG BIOPSY LUNG    . ESOPHAGOGASTRODUODENOSCOPY N/A 07/23/2012   Procedure: ESOPHAGOGASTRODUODENOSCOPY (EGD);  Surgeon: Jeryl Columbia, MD;  Location: [facility] ENDOSCOPY;  Service: Endoscopy;  Laterality: N/A;  buccini /ja  . GASTROSTOMY W/ FEEDING TUBE  2008   Dr. Watt Climes; only had for a few months  . HAND SURGERY  1989   crushed thumb, Dr. Fredna Dow  . PROSTATECTOMY  04/2001   removal of prostate cancer, Dr. Janice Norrie  . SPINE SURGERY    . [place]   left arm, Dr. Fredna Dow    Current Outpatient Rx  . Order #: [id number] Class: Normal  . Order #: [id number] Class: Normal    Allergies Lisinopril; Lamictal [lamotrigine]; and Ambien [zolpidem]  Family History  Problem Relation Age of Onset  . Heart disease Mother   . Cancer Father        brain cancer and prostate cancer     Social History Social History  Substance Use Topics  . Smoking status: Former Smoker    Packs/day: 1.50    Years: 52.00    Types: Cigarettes    Quit date: 07/17/2012  . Smokeless tobacco: Never Used     Comment: Former smoker  . Alcohol use No    Review of Systems  Constitutional: No fever/chills. Positive fatigue and confusion.  Eyes: No visual changes. ENT: No sore throat. Cardiovascular: Positive right sided chest pain. Respiratory: Denies shortness of breath. Gastrointestinal: No abdominal pain. No nausea, no  vomiting.  No diarrhea.  No constipation. Genitourinary: Negative for dysuria. Musculoskeletal: Negative for back pain. Skin: Negative for rash. Neurological: Negative for headaches, focal weakness or numbness.  10-point ROS otherwise negative.  ____________________________________________   PHYSICAL EXAM:  VITAL SIGNS: ED Triage Vitals  Enc Vitals Group     BP 07/28/16 1545 (!) 73/52     Pulse Rate 07/28/16 1545 80     Resp 07/28/16 1545 15     SpO2 07/28/16 1545 (!) 87 %     Weight 07/28/16 1534 163 lb (73.9 kg)     Height 07/28/16 1534 5\' 10"  (1.778 m)   Constitutional: Alert and oriented but does appear slightly drowsy.  Eyes: Conjunctivae are normal. PERRL.  Head:  Atraumatic. Nose: No congestion/rhinnorhea. Mouth/Throat: Mucous membranes are dry.  Neck: No stridor.   Cardiovascular: Normal rate, regular rhythm. Good peripheral circulation. Grossly normal heart sounds.   Respiratory: Normal respiratory effort.  No retractions. Lungs CTAB. Gastrointestinal: Soft with mild diffuse tenderness. No distention.  Musculoskeletal: No lower extremity tenderness nor edema. No gross deformities of extremities. Neurologic:  Normal speech and language. No gross focal neurologic deficits are appreciated.  Skin:  Skin is warm, dry and intact. No rash noted.  ____________________________________________   LABS (all labs ordered are listed, but only abnormal results are displayed)  Labs Reviewed  COMPREHENSIVE METABOLIC PANEL - Abnormal; Notable for the following:       Result Value   BUN 28 (*)    Creatinine, Ser 1.67 (*)    Calcium 7.6 (*)    Total Protein 5.2 (*)    Albumin 2.8 (*)    Alkaline Phosphatase 37 (*)    GFR calc non Af Amer 40 (*)    GFR calc Af Amer 46 (*)    All other components within normal limits  CBC WITH DIFFERENTIAL/PLATELET - Abnormal; Notable for the following:    WBC 13.4 (*)    RBC 3.49 (*)    Hemoglobin 10.9 (*)    HCT 33.5 (*)    Neutro Abs  11.0 (*)    All other components within normal limits  PROTIME-INR - Abnormal; Notable for the following:    Prothrombin Time 15.8 (*)    All other components within normal limits  APTT - Abnormal; Notable for the following:    aPTT 43 (*)    All other components within normal limits  MAGNESIUM - Abnormal; Notable for the following:    Magnesium 1.4 (*)    All other components within normal limits  TSH - Abnormal; Notable for the following:    TSH 0.202 (*)    All other components within normal limits  COMPREHENSIVE METABOLIC PANEL - Abnormal; Notable for the following:    Glucose, Bld 109 (*)    BUN 21 (*)    Calcium 7.6 (*)    Total Protein 5.3 (*)    Albumin 2.6 (*)    Alkaline Phosphatase 36 (*)    All other components within normal limits  CBC - Abnormal; Notable for the following:    WBC 14.7 (*)    RBC 3.35 (*)    Hemoglobin 10.7 (*)    HCT 32.3 (*)    All other components within normal limits  MRSA PCR SCREENING  CULTURE, BLOOD (ROUTINE X 2)  CULTURE, BLOOD (ROUTINE X 2)  CULTURE, EXPECTORATED SPUTUM-ASSESSMENT  GRAM STAIN  URINALYSIS, ROUTINE W REFLEX MICROSCOPIC  STREP PNEUMONIAE URINARY ANTIGEN  LACTIC ACID, PLASMA  LACTIC ACID, PLASMA  PROCALCITONIN  TROPONIN I  TROPONIN I  PHOSPHORUS  TROPONIN I  I-STAT CG4 LACTIC ACID, ED  I-STAT TROPOININ, ED   ____________________________________________  EKG   EKG Interpretation  Date/Time:  Monday July 28 2016 16:42:05 EDT Ventricular Rate:  81 PR Interval:    QRS Duration: 88 QT Interval:  392 QTC Calculation: 455 R Axis:   56 Text Interpretation:  Sinus rhythm Low voltage, precordial leads No STEMI.  Confirmed by Nanda Quinton [phone]) on 07/28/2016 5:04:06 PM       ____________________________________________  RADIOLOGY  Dg Chest Port 1 View  Result Date: 07/28/2016 CLINICAL DATA:  71 year old male was unresponsive at 1400 hours today. Hypoxia. Shortness of breath and weakness. Prior aspiration  pneumonia. EXAM: PORTABLE  CHEST 1 VIEW COMPARISON:  Chest CT 04/16/2016, portable chest radiograph 01/21/2016, and earlier. FINDINGS: Portable AP semi upright view at 1631 hours. The patient is rotated toward the left similar to the prior portable film. Confluent and reticulonodular opacity at the left lung base is new from the prior radiograph although likely superimposed on chronic lingula scarring and atelectasis. Superimposed platelike atelectasis in the left mid lung. No pneumothorax or pulmonary edema. No definite pleural effusion. Stable cardiac size and mediastinal contours. Chronic thoracolumbar fusion hardware. IMPRESSION: Acute airspace opacity at the left lung base. Consider aspiration pneumonia in this clinical setting. No definite pleural effusion. Electronically Signed   By: Genevie Ann M.D.   On: 07/28/2016 17:31    ____________________________________________   PROCEDURES  Procedure(s) performed:   Procedures  CRITICAL CARE Performed by: Margette Fast Total critical care time: 35 minutes Critical care time was exclusive of separately billable procedures and treating other patients. Critical care was necessary to treat or prevent imminent or life-threatening deterioration. Critical care was time spent personally by me on the following activities: development of treatment plan with patient and/or surrogate as well as nursing, discussions with consultants, evaluation of patient's response to treatment, examination of patient, obtaining history from patient or surrogate, ordering and performing treatments and interventions, ordering and review of laboratory studies, ordering and review of radiographic studies, pulse oximetry and re-evaluation of patient's condition.  Nanda Quinton, MD Emergency Medicine  ____________________________________________   INITIAL IMPRESSION / ASSESSMENT AND PLAN / ED COURSE  Pertinent labs & imaging results that were available during my care of the  patient were reviewed by me and considered in my medical decision making (see chart for details).  Patient presents to the emergency department for evaluation of sudden onset of confusion. He is hypotensive after 2.5 L of fluids. No fever here. Patient not on hospice but is followed by palliative care. Wife states he started on this after consultation with his pulmonologist due to frequent aspiration pneumonias. He is not tapering off of his steroid. No other evidence of acute blood loss or severe dehydration. Initiated full sepsis w/u. Patient would not want CPR or ACLS but would want to be intubated and would want a central line and pressors if needed.   05:54 PM Patient looking much better. Blood pressure up trending. MAPs in the 14s. Patient with evidence of pneumonia on portable chest x-ray. Also with acute kidney injury on lab work. Question if some of this hypertension may be secondary to adrenal insufficiency but will hold on stress dose steroids if improving blood pressures with IV fluids and identified infection. I paged the hospitalist for admission [facility] to [facility] ICU. Will also give hydrocortisone with patient's history of chronic steroid use.   Discussed patient's case with hospitalist, Dr. Roel Cluck. Patient and family (if present) updated with plan. Care transferred to Hospitalist service.  I reviewed all nursing notes, vitals, pertinent old records, EKGs, labs, imaging (as available).  ____________________________________________  FINAL CLINICAL IMPRESSION(S) / ED DIAGNOSES  Final diagnoses:  Aspiration pneumonia of left lower lobe, unspecified aspiration pneumonia type (HCC)  Hypotension, unspecified hypotension type  Hypoxemia     MEDICATIONS GIVEN DURING THIS VISIT:  Medications  piperacillin-tazobactam (ZOSYN) IVPB 3.375 g (0 g Intravenous Paused 07/29/16 0954)  hydrocortisone sodium succinate (SOLU-CORTEF) 100 MG injection 100 mg (100 mg Intravenous Given 07/29/16  0941)  loratadine (CLARITIN) tablet 10 mg (10 mg Oral Given 07/29/16 0941)  tiZANidine (ZANAFLEX) tablet 4 mg (not administered)  dabigatran (PRADAXA)  capsule 150 mg (150 mg Oral Given 07/29/16 0941)  feeding supplement (ENSURE ENLIVE) (ENSURE ENLIVE) liquid 237 mL (not administered)  atorvastatin (LIPITOR) tablet 20 mg (not administered)  primidone (MYSOLINE) tablet 250 mg (250 mg Oral Given 07/29/16 0941)  montelukast (SINGULAIR) tablet 10 mg (10 mg Oral Given 07/28/16 2218)  famotidine (PEPCID) tablet 10 mg (10 mg Oral Given 07/29/16 0941)  diazepam (VALIUM) tablet 5 mg (not administered)  DULoxetine (CYMBALTA) DR capsule 120 mg (120 mg Oral Given 07/29/16 0941)  divalproex (DEPAKOTE ER) 24 hr tablet 1,000 mg (1,000 mg Oral Given 07/28/16 2219)  pantoprazole (PROTONIX) EC tablet 40 mg (40 mg Oral Given 07/29/16 0941)  sodium chloride flush (NS) 0.9 % injection 3 mL (3 mLs Intravenous Given 07/29/16 1000)  acetaminophen (TYLENOL) tablet 650 mg (not administered)    Or  acetaminophen (TYLENOL) suppository 650 mg (not administered)  HYDROcodone-acetaminophen (NORCO/VICODIN) 5-325 MG per tablet 1-2 tablet (2 tablets Oral Given 07/28/16 2219)  ondansetron (ZOFRAN) tablet 4 mg (not administered)    Or  ondansetron (ZOFRAN) injection 4 mg (not administered)  0.9 %  sodium chloride infusion ( Intravenous Stopped 07/29/16 0808)  albuterol (PROVENTIL) (2.5 MG/3ML) 0.083% nebulizer solution 2.5 mg (not administered)  guaiFENesin (MUCINEX) 12 hr tablet 600 mg (600 mg Oral Given 07/29/16 0941)  budesonide (PULMICORT) nebulizer solution 0.5 mg (0.5 mg Nebulization Given 07/29/16 0928)  ipratropium-albuterol (DUONEB) 0.5-2.5 (3) MG/3ML nebulizer solution 3 mL (3 mLs Nebulization Given 07/29/16 0928)  diltiazem (CARDIZEM CD) 24 hr capsule 120 mg (120 mg Oral Given 07/29/16 0941)  magnesium sulfate IVPB 2 g 50 mL (2 g Intravenous New Bag/Given 07/29/16 0954)  0.9 %  sodium chloride infusion ( Intravenous New Bag/Given  07/29/16 0821)  arformoterol (BROVANA) nebulizer solution 15 mcg (not administered)  sodium chloride HYPERTONIC 3 % nebulizer solution 4 mL (not administered)  sodium chloride 0.9 % bolus 1,000 mL (0 mLs Intravenous Stopped 07/28/16 1643)    And  sodium chloride 0.9 % bolus 250 mL (0 mLs Intravenous Stopped 07/28/16 1721)  piperacillin-tazobactam (ZOSYN) IVPB 3.375 g (0 g Intravenous Stopped 07/28/16 1721)  vancomycin (VANCOCIN) IVPB 1000 mg/200 mL premix (0 mg Intravenous Stopped 07/28/16 1840)  hydrocortisone sodium succinate (SOLU-CORTEF) 100 MG injection 100 mg (100 mg Intravenous Given 07/28/16 1849)  magnesium sulfate IVPB 2 g 50 mL (0 g Intravenous Stopped 07/29/16 0706)     NEW OUTPATIENT MEDICATIONS STARTED DURING THIS VISIT:  None   Note:  This document was prepared using Dragon voice recognition software and may include unintentional dictation errors.  Nanda Quinton, MD Emergency Medicine    Malik Zuniga, [facility], MD 07/29/16 1002

## 2016-07-28 NOTE — ED Notes (Signed)
Patient's wife reports that the pallative nurse and herself were unable to wake the patient up this afternoon around 1400 and ws cold and clammy. Patient's wife reports that he often gets aspirate pneumonia.

## 2016-07-28 NOTE — ED Notes (Signed)
Transport called.

## 2016-07-28 NOTE — ED Notes (Signed)
Bed: UY37 Expected date:  Expected time:  Means of arrival:  Comments: EMS hypotensive

## 2016-07-28 NOTE — ED Notes (Signed)
Patient given 1000 ml NS when patient first arrived to the ED due to hypotension. EDP made aware.

## 2016-07-28 NOTE — H&P (Signed)
Malik LOBUE EPP:[id number] DOB: 1945/07/01 DOA: 07/28/2016     PCP: [name], MD   Outpatient Specialists: Pulmonology [place], Cardiology [facility]   Patient coming from:   home Lives   With family    Chief Complaint: fatigue  HPI: Malik Zuniga is a 71 y.o. 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 This is typical for his presentation of aspiration pneumonia. Wife and palliative care nurse had hard time waking him up and when they check his blood pressure was low Palliative care nurse notified the patient was hypotensive with systolic blood pressure down to 65/40. EMS was called 1.5 L normal saline was given  Family endorses history of recent sharp right-sided chest pain 1 week ago and today he was complaining of similar left-sided chest pain worse with deep breaths. Family did not notice any nausea vomiting or diarrhea he appeared normal this morning. Of note patient is on chronic prednisone 10 mg history of COPD/bronchiectasis.   Patient states that he does not wish to be resuscitated but does wish to have pressors and intubation if needed. Although no chest compressions and no shocks to be administered. 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 Currently on therapy vest  Since 2017. Learning history of coronary artery disease patient followed by cardiology last myocardial perfusion study was in June 2017 EF was 48% no ischemia present He should have had history of paroxysmal atrial fibrillation he is on diltiazem and chronic anticoagulation with Pradaxa      IN ER:  No data recorded.      on arrival  ED  Triage Vitals  Enc Vitals Group     BP 07/28/16 1545 (!) 73/52     Pulse Rate 07/28/16 1545 80     Resp 07/28/16 1545 15     Temp --      Temp src --      SpO2 07/28/16 1545 (!) 87 %     Weight 07/28/16 1534 163 lb (73.9 kg)     Height 07/28/16 1534 5\' 10"  (1.778 m)     Head Circumference --      Peak Flow --      Pain Score --      Pain Loc --      Pain Edu? --      Excl. in [place]? --      Following Medications were ordered in ER: Medications  vancomycin (VANCOCIN) IVPB 750 mg/150 ml premix (not administered)  piperacillin-tazobactam (ZOSYN) IVPB 3.375 g (not administered)  hydrocortisone sodium succinate (SOLU-CORTEF) 100 MG injection 100 mg (not administered)  sodium chloride 0.9 % bolus 1,000 mL (0 mLs Intravenous Stopped 07/28/16 1643)    And  sodium chloride 0.9 % bolus 250 mL (0 mLs Intravenous Stopped 07/28/16 1721)  piperacillin-tazobactam (ZOSYN) IVPB 3.375 g (0 g Intravenous Stopped 07/28/16 1721)  vancomycin (VANCOCIN) IVPB 1000 mg/200 mL premix (1,000 mg Intravenous New Bag/Given 07/28/16 1725)   Latest RR 17 satting 89% HR 76 BP 91/58 LA 0.92 troponin 0.02 Sodium 1:30 9K4.9 creatinine 1.67 up from baseline of 0.8 albumin 2.8  WBC 13.4 hemoglobin 10.9  Chest x-ray acute airspace opacity in the left  lung base Meeting sepsis criteria with hypoxia and severe hypotension and AKI with evidence of infection    Hospitalist was called for admission for aspiration pneumonia leading to severe sepsis  Review of Systems:    Pertinent positives include: fatigue, confusion shortness of breath at rest. chest pain, Constitutional:  No weight loss, night sweats, Fevers, chills,  weight loss  HEENT:  No headaches, Difficulty swallowing,Tooth/dental problems,Sore throat,  No sneezing, itching, ear ache, nasal congestion, post nasal drip,  Cardio-vascular:  No  Orthopnea, PND, anasarca, dizziness, palpitations.no Bilateral lower extremity swelling  GI:  No heartburn, indigestion,  abdominal pain, nausea, vomiting, diarrhea, change in bowel habits, loss of appetite, melena, blood in stool, hematemesis Resp:  no  No dyspnea on exertion, No excess mucus, no productive cough, No non-productive cough, No coughing up of blood.No change in color of mucus.No wheezing. Skin:  no rash or lesions. No jaundice GU:  no dysuria, change in color of urine, no urgency or frequency. No straining to urinate.  No flank pain.  Musculoskeletal:  No joint pain or no joint swelling. No decreased range of motion. No back pain.  Psych:  No change in mood or affect. No depression or anxiety. No memory loss.  Neuro: no localizing neurological complaints, no tingling, no weakness, no double vision, no gait abnormality, no slurred speech, no   As per HPI otherwise 10 point review of systems negative.   Past Medical History: Past Medical History:  Diagnosis Date  . Anemia associated with acute blood loss   . Angioedema 10/31/2010  . Anxiety    takes Valium daily  . Aspiration pneumonia ([place]) 2010  . Asthma   . Bacteremia due to Pseudomonas 10/04/2011   D/t gram neg rods   . Bipolar affective disorder ([place])   . 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 Chronic back pain    compression fracture  . Constipation    takes Colace daily as well as Miralax  . Coronary artery disease   . Depression    takes Cymbalta daily  . Emphysema of lung (HCC)    Albuterol as needed;Symbicort daily and Singulair at bedtime  . GERD (gastroesophageal reflux disease)    takes Omeprazole daily  . Headache, chronic daily   . History of bronchitis   . History of colon polyps   . History of migraine    last migraine a couple of days ago;takes Excedrin Migraine  . History of prostate cancer 2004  . Hyperlipidemia    takes Simvastatin daily  . Hypertension    takes Metoprolol daily as well as Hyzaar  . Insomnia    takes Benadryl nightly  . Lung cancer ([place]) 2016  . Mood  change ([place])    after Brain surgery mood changed and was placed on Depakote  . Myocardial infarction ([place]) 04/1998  . Neuromuscular disorder ([place]) 1998   right carpal tunnel release  . On home O2   . Shortness of breath    with exertion  . Stroke [facility]) 1998   Brain Aneurysm  . Tremor    Past Surgical History:  Procedure Laterality Date  . BACK SURGERY  08/2004; 02/2005; 04/2006; 06/2007; 7/20101   all by Dr. Annette Stable  . BACK SURGERY  05/2013  . BRAIN SURGERY  1999   clip aneurysm  . [place]   right  . CHOLECYSTECTOMY  1996  . COLONOSCOPY    .  CORONARY ANGIOPLASTY WITH STENT PLACEMENT  2000   RCA stent, Dr. Rollene Fare; denies stent for PAD 06/15/13  . CORONARY ANGIOPLASTY WITH STENT PLACEMENT  04/1998  . CRANIOTOMY  1999   to clip aneurism, Dr. Annette Stable  . DG BIOPSY LUNG    . ESOPHAGOGASTRODUODENOSCOPY N/A 07/23/2012   Procedure: ESOPHAGOGASTRODUODENOSCOPY (EGD);  Surgeon: Jeryl Columbia, MD;  Location: [facility] ENDOSCOPY;  Service: Endoscopy;  Laterality: N/A;  buccini /ja  . GASTROSTOMY W/ FEEDING TUBE  2008   Dr. Watt Climes; only had for a few months  . HAND SURGERY  1989   crushed thumb, Dr. Fredna Dow  . PROSTATECTOMY  04/2001   removal of prostate cancer, Dr. Janice Norrie  . SPINE SURGERY    . [place]   left arm, Dr. Fredna Dow     Social History:  Ambulatory  wheelchair bound at home able to walk with a walker,     reports that he quit smoking about 4 years ago. His smoking use included Cigarettes. He has a 78.00 pack-year smoking history. He has never used smokeless tobacco. He reports that he does not drink alcohol or use drugs.  Allergies:   Allergies  Allergen Reactions  . Lisinopril Swelling    ANGIOEDEMA  . Lamictal [Lamotrigine] Other (See Comments)    Weakness/difficulty swallowing  . Ambien [Zolpidem] Other (See Comments)    Unknown reaction       Family History:   Family History  Problem Relation Age of Onset  . Heart disease Mother   . Cancer  Father        brain cancer and prostate cancer     Medications: Prior to Admission medications   Medication Sig Start Date End Date Taking? Authorizing Provider  ARIPiprazole (ABILIFY) 10 MG tablet Take 5 mg by mouth daily.  05/09/14  Yes [provider]  aspirin-acetaminophen-caffeine (EXCEDRIN MIGRAINE) [id number] MG tablet Take 2 tablets by mouth every 6 (six) hours as needed for headache.   Yes [provider]  atorvastatin (LIPITOR) 20 MG tablet Take 1 tablet (20 mg total) by mouth daily at 6 PM. 03/07/16  Yes Belva Crome, MD  budesonide (PULMICORT) 0.25 MG/2ML nebulizer solution Take 4 mLs (0.5 mg total) by nebulization 2 (two) times daily. 12/07/13  Yes Clance, Armando Reichert, MD  cetirizine (ZYRTEC) 10 MG tablet Take 10 mg by mouth daily.   Yes [provider]  cholecalciferol (VITAMIN D) 1000 UNITS tablet Take 1,000 Units by mouth every morning.    Yes [provider]  dabigatran (PRADAXA) 150 MG CAPS capsule Take 1 capsule (150 mg total) by mouth every 12 (twelve) hours. 03/19/16  Yes Belva Crome, MD  diazepam (VALIUM) 5 MG tablet Take 1 tablet (5 mg total) by mouth every 8 (eight) hours as needed for anxiety or muscle spasms. scheduled 06/29/15  Yes Ollis, Cleaster Corin, NP  diltiazem (CARDIZEM CD) 240 MG 24 hr capsule Take 1 capsule (240 mg total) by mouth daily. 07/14/16  Yes Belva Crome, MD  divalproex (DEPAKOTE) 500 MG 24 hr tablet Take 1,000 mg by mouth at bedtime.    Yes [provider]  docusate sodium (COLACE) 100 MG capsule Take 200 mg by mouth at bedtime.    Yes [provider]  DULoxetine (CYMBALTA) 60 MG capsule Take 120 mg by mouth daily.    Yes [provider]  feeding supplement, ENSURE ENLIVE, (ENSURE ENLIVE) LIQD Take 237 mLs by mouth daily.   Yes [provider]  fluticasone (FLONASE) 50 MCG/ACT nasal spray Place 2 sprays into both nostrils daily as needed for allergies or rhinitis.   Yes [provider]  HYDROcodone-acetaminophen (NORCO) 10-325 MG tablet Take 1-2 tablets by mouth every 4 (four) hours as needed for pain. 01/07/16  Yes [provider]  ipratropium-albuterol (DUONEB) 0.5-2.5 (3) MG/3ML SOLN Take 3 mLs by nebulization every 6 (six) hours. 12/07/13  Yes Clance, Armando Reichert, MD  losartan (COZAAR) 50 MG tablet Take 50 mg by mouth daily.   Yes [provider]  Maltodextrin-Xanthan Gum (RESOURCE THICKENUP CLEAR) POWD Take 1 g by mouth as needed. 01/24/16  Yes Eugenie Filler, MD  metoprolol tartrate (LOPRESSOR) 50 MG tablet Take 1 tablet (50 mg total) by mouth 2 (two) times daily. 07/15/16  Yes Belva Crome, MD  montelukast (SINGULAIR) 10 MG tablet TAKE 1 TABLET AT BEDTIME 03/12/16  Yes [name], MD  omeprazole (PRILOSEC) 40 MG capsule Take 40 mg by mouth 2 (two) times daily.    Yes [provider]  polyethylene glycol (MIRALAX / GLYCOLAX) packet Take 17 g by mouth 3 (three) times daily as needed for mild constipation.   Yes [provider]  predniSONE (DELTASONE) 10 MG tablet TAKE 1 TABLET (10 MG TOTAL) BY MOUTH DAILY WITH BREAKFAST. 05/28/16  Yes [name], MD  primidone (MYSOLINE) 250 MG tablet TAKE 1 TABLET TWICE DAILY 03/12/16  Yes [name], MD  promethazine (PHENERGAN) 25 MG tablet Take one (1) tablet by mouth every 6 to 8 hours as needed for nausea. 04/15/16  Yes [provider]  ranitidine (ZANTAC) 150 MG tablet Take 150-300 mg by mouth 2 (two) times daily as needed for heartburn.   Yes [provider]  sodium chloride HYPERTONIC 3 % nebulizer solution Take by nebulization 2 (two) times daily. 04/03/16  Yes [name], MD  tiZANidine (ZANAFLEX) 4 MG tablet TAKE 1 TABLET BY MOUTH EVERY 8 HOURS AS NEEDED FOR MUSCLE SPASMS. 05/21/16  Yes [name], MD  cyclobenzaprine (FLEXERIL) 5 MG tablet Take 1 tablet (5 mg total) by mouth 3 (three) times daily as needed for muscle  spasms. Patient not taking: Reported on 07/28/2016 01/24/16   Eugenie Filler, MD  nitroGLYCERIN (NITROSTAT) 0.4 MG SL tablet PLACE 1 TABLET (0.4 MG TOTAL) UNDER THE TONGUE EVERY 5 (FIVE) MINUTES AS NEEDED FOR CHEST PAIN. 08/06/15   Belva Crome, MD    Physical Exam: Patient Vitals for the past 24 hrs:  BP Pulse Resp SpO2 Height Weight  07/28/16 1800 (!) 91/58 76 17 (!) 89 % - -  07/28/16 1745 93/65 - 15 - - -  07/28/16 1730 92/60 78 15 93 % - -  07/28/16 1715 (!) 91/56 73 15 (!) 86 % - -  07/28/16 1645 (!) 85/53 79 19 (!) 85 % - -  07/28/16 1600 (!) 72/48 77 14 91 % - -  07/28/16 1545 (!) 73/52 80 15 (!) 87 % - -  07/28/16 1534 - - - - 5\' 10"  (1.778 m) 73.9 kg (163 lb)    1. General:  in No Acute distress 2. Psychological: Alert    Oriented 3. Head/ENT:     Dry Mucous Membranes                          Head Non traumatic, neck supple  Poor Dentition 4. SKIN:  decreased Skin turgor,  Skin clean Dry and intact no rash 5. Heart: Regular rate and rhythm no Murmur, Rub or gallop 6. Lungs:  no wheezes some crackles   7. Abdomen: Soft, non-tender, Non distended 8. Lower extremities: no clubbing, cyanosis, or edema 9. Neurologically Grossly intact, moving all 4 extremities equally   10. MSK: Normal range of motion   body mass index is 23.39 kg/m.  Labs on Admission:   Labs on Admission: I have personally reviewed following labs and imaging studies  CBC:  Recent Labs Lab 07/28/16 1629  WBC 13.4*  NEUTROABS 11.0*  HGB 10.9*  HCT 33.5*  MCV 96.0  PLT 585   Basic Metabolic Panel:  Recent Labs Lab 07/28/16 1629  NA 139  K 4.9  CL 106  CO2 26  GLUCOSE 80  BUN 28*  CREATININE 1.67*  CALCIUM 7.6*   GFR: Estimated Creatinine Clearance: 41.9 mL/min (A) (by C-G formula based on SCr of 1.67 mg/dL (H)). Liver Function Tests:  Recent Labs Lab 07/28/16 1629  AST 40  ALT 39  ALKPHOS 37*  BILITOT 0.5  PROT 5.2*  ALBUMIN 2.8*   No  results for input(s): LIPASE, AMYLASE in the last 168 hours. No results for input(s): AMMONIA in the last 168 hours. Coagulation Profile: No results for input(s): INR, PROTIME in the last 168 hours. Cardiac Enzymes: No results for input(s): CKTOTAL, CKMB, CKMBINDEX, TROPONINI in the last 168 hours. BNP (last 3 results) No results for input(s): PROBNP in the last 8760 hours. HbA1C: No results for input(s): HGBA1C in the last 72 hours. CBG: No results for input(s): GLUCAP in the last 168 hours. Lipid Profile: No results for input(s): CHOL, HDL, LDLCALC, TRIG, CHOLHDL, LDLDIRECT in the last 72 hours. Thyroid Function Tests: No results for input(s): TSH, T4TOTAL, FREET4, T3FREE, THYROIDAB in the last 72 hours. Anemia Panel: No results for input(s): VITAMINB12, FOLATE, FERRITIN, TIBC, IRON, RETICCTPCT in the last 72 hours. Urine analysis:  Sepsis Labs: @LABRCNTIP (procalcitonin:4,lacticidven:4) )No results found for this or any previous visit (from the past 240 hour(s)).     UA ordered  Lab Results  Component Value Date   HGBA1C 5.6 04/01/2015    Estimated Creatinine Clearance: 41.9 mL/min (A) (by C-G formula based on SCr of 1.67 mg/dL (H)).  BNP (last 3 results) No results for input(s): PROBNP in the last 8760 hours.   ECG REPORT  Independently reviewed Rate: 81  Rhythm: On review appears to be sinus rhythm ST&T Change: No acute ischemic changes   [id number]  Filed Weights   07/28/16 1534  Weight: 73.9 kg (163 lb)     Cultures:    Component Value Date/Time   SDES URINE, CLEAN CATCH 12/18/2015 2240   SPECREQUEST NONE 12/18/2015 2240   CULT NO GROWTH Performed at [facility]  12/18/2015 2240   REPTSTATUS 12/20/2015 FINAL 12/18/2015 2240     Radiological Exams on Admission: Dg Chest Port 1 View  Result Date: 07/28/2016 CLINICAL DATA:  71 year old male was unresponsive at 1400 hours today. Hypoxia. Shortness of breath and weakness. Prior aspiration pneumonia.  EXAM: PORTABLE CHEST 1 VIEW COMPARISON:  Chest CT 04/16/2016, portable chest radiograph 01/21/2016, and earlier. FINDINGS: Portable AP semi upright view at 1631 hours. The patient is rotated toward the left similar to the prior portable film. Confluent and reticulonodular opacity at the left lung base is new from the prior radiograph although likely superimposed on chronic lingula scarring and atelectasis. Superimposed platelike atelectasis in  the left mid lung. No pneumothorax or pulmonary edema. No definite pleural effusion. Stable cardiac size and mediastinal contours. Chronic thoracolumbar fusion hardware. IMPRESSION: Acute airspace opacity at the left lung base. Consider aspiration pneumonia in this clinical setting. No definite pleural effusion. Electronically Signed   By: Genevie Ann M.D.   On: 07/28/2016 17:31    Chart has been reviewed    Assessment/Plan  71 y.o. male with medical history significant of COPD 2.5 L oxygen at baseline,   recurrent aspiration pneumonia and bronchiectasis, Lung Ca stage 1, bipolar disorder and CVA, CAD, HTN, HLD  Admitted for severe sepsis secondary to aspiration pneumonia   Present on Admission: . Bipolar disorder ([place]) stable continue home medications . CAD (coronary artery disease) Reck chest pain no pleuritic secondary to pneumonia rather than ACS cycle cardiac enzymes . COPD (chronic obstructive pulmonary disease) ([place]) sure patient is a home regimen   . Essential hypertension blood pressure medications on hold given soft blood pressures initially . Paroxysmal atrial fibrillation (HCC)  - hold Cardizem given hypotension continue Pradaxa . Primary cancer of right lower lobe of lung ([place]) localized cancer currently in remission . Hypotension hold home medications for today given hypotension continue stress dose steroids as patient is on chronic prednisone . Severe sepsis ([place]) admit to step down likely cause being pneumonia administer IV fluids, follow serial  lactic acid discussed with [facility] M who will see in consult tomorrow continue broad-spectrum antibiotics . Aspiration pneumonia ([place]) cover with vancomycin Zosyn given history of bronchiectasis and recurrent admissions . Chronic combined systolic (congestive) and diastolic (congestive) heart failure (HCC) -currently appears to be somewhat fluid down administer gentle IV fluids . Chest pain most likely musculoskeletal/pleuritic secondary to coughing and no pneumonia. Given risk factors cycle cardiac enzymes Dysphagia continue dysphagia diet with honey thickened fluids patient have had numerous evaluations in the past  Other plan as per orders.  DVT prophylaxis:  Pradaxa  Code Status:  Limited code DNR, no chest compressions, no shocks but ok to intubate and administer pressirs per patient    Family Communication:   Family  at  Bedside  plan of care was discussed with   Wife    Disposition Plan:       To home once workup is complete and patient is stable                                                 Consults called:   PCCM aware we'll see patient in consult  Admission status:   inpatient    Level of care        SDU      I have spent a total of 77 min on this admission extra time was taken to discuss case with consultants as well as to discuss CODE STATUS with patient and family Lonetta Blassingame 07/29/2016, 1:01 AM    [facility]  Pager [phone]   after 2 AM please page floor coverage PA If 7AM-7PM, please contact the day team taking care of the patient  [email or web address]  Password [email or web address]

## 2016-07-28 NOTE — Progress Notes (Signed)
Pharmacy Antibiotic Note  Malik Zuniga is a 71 y.o. male admitted on 07/28/2016 with sepsis.  Chest Xray shows possible apiration pneumonia.  Patient received Vancomycin 1gm and Zosyn 3.375gm IV x 1 dose each in the ED.  Pharmacy has been consulted for Vancomycin & Zosyn dosing.  Plan:  Vancomycin 750mg  IV q12h  Zosyn 3.375gm IV q8h (each dose infused over 4 hrs)  Check daily SCr  Follow cultures  Check vancomycin trough level when appropriate  Height: 5\' 10"  (177.8 cm) Weight: 163 lb (73.9 kg) IBW/kg (Calculated) : 73  No data recorded.   Recent Labs Lab 07/28/16 1629 07/28/16 1644  WBC 13.4*  --   CREATININE 1.67*  --   LATICACIDVEN  --  0.92    Estimated Creatinine Clearance: 41.9 mL/min (A) (by C-G formula based on SCr of 1.67 mg/dL (H)).    Allergies  Allergen Reactions  . Lisinopril Swelling    ANGIOEDEMA  . Lamictal [Lamotrigine] Other (See Comments)    Weakness/difficulty swallowing  . Ambien [Zolpidem] Other (See Comments)    Unknown reaction    Antimicrobials this admission: 6/18 vanc >>   6/18 zosyn >>    Dose adjustments this admission:    Microbiology results: 6/18 BCx: sent  Thank you for allowing pharmacy to be a part of this patient's care.  Everette Rank, PharmD 07/28/2016 5:58 PM

## 2016-07-29 ENCOUNTER — Encounter (HOSPITAL_COMMUNITY): Payer: Self-pay

## 2016-07-29 DIAGNOSIS — A419 Sepsis, unspecified organism: Principal | ICD-10-CM

## 2016-07-29 DIAGNOSIS — R6520 Severe sepsis without septic shock: Secondary | ICD-10-CM

## 2016-07-29 DIAGNOSIS — J449 Chronic obstructive pulmonary disease, unspecified: Secondary | ICD-10-CM

## 2016-07-29 DIAGNOSIS — J470 Bronchiectasis with acute lower respiratory infection: Secondary | ICD-10-CM

## 2016-07-29 DIAGNOSIS — J690 Pneumonitis due to inhalation of food and vomit: Secondary | ICD-10-CM

## 2016-07-29 LAB — CBC
HCT: 32.3 % — ABNORMAL LOW (ref 39.0–52.0)
Hemoglobin: 10.7 g/dL — ABNORMAL LOW (ref 13.0–17.0)
MCH: 31.9 pg (ref 26.0–34.0)
MCHC: 33.1 g/dL (ref 30.0–36.0)
MCV: 96.4 fL (ref 78.0–100.0)
PLATELETS: 169 10*3/uL (ref 150–400)
RBC: 3.35 MIL/uL — AB (ref 4.22–5.81)
RDW: 14.6 % (ref 11.5–15.5)
WBC: 14.7 10*3/uL — ABNORMAL HIGH (ref 4.0–10.5)

## 2016-07-29 LAB — BLOOD CULTURE ID PANEL (REFLEXED)
ACINETOBACTER BAUMANNII: NOT DETECTED
Candida albicans: NOT DETECTED
Candida glabrata: NOT DETECTED
Candida krusei: NOT DETECTED
Candida parapsilosis: NOT DETECTED
Candida tropicalis: NOT DETECTED
ENTEROCOCCUS SPECIES: NOT DETECTED
Enterobacter cloacae complex: NOT DETECTED
Enterobacteriaceae species: NOT DETECTED
Escherichia coli: NOT DETECTED
HAEMOPHILUS INFLUENZAE: NOT DETECTED
KLEBSIELLA OXYTOCA: NOT DETECTED
Klebsiella pneumoniae: NOT DETECTED
Listeria monocytogenes: NOT DETECTED
METHICILLIN RESISTANCE: NOT DETECTED
Neisseria meningitidis: NOT DETECTED
PSEUDOMONAS AERUGINOSA: NOT DETECTED
Proteus species: NOT DETECTED
SERRATIA MARCESCENS: NOT DETECTED
STAPHYLOCOCCUS AUREUS BCID: NOT DETECTED
STAPHYLOCOCCUS SPECIES: DETECTED — AB
Streptococcus agalactiae: NOT DETECTED
Streptococcus pneumoniae: NOT DETECTED
Streptococcus pyogenes: NOT DETECTED
Streptococcus species: NOT DETECTED

## 2016-07-29 LAB — COMPREHENSIVE METABOLIC PANEL
ALK PHOS: 36 U/L — AB (ref 38–126)
ALT: 35 U/L (ref 17–63)
AST: 28 U/L (ref 15–41)
Albumin: 2.6 g/dL — ABNORMAL LOW (ref 3.5–5.0)
Anion gap: 7 (ref 5–15)
BUN: 21 mg/dL — ABNORMAL HIGH (ref 6–20)
CALCIUM: 7.6 mg/dL — AB (ref 8.9–10.3)
CO2: 23 mmol/L (ref 22–32)
CREATININE: 0.97 mg/dL (ref 0.61–1.24)
Chloride: 108 mmol/L (ref 101–111)
GFR calc non Af Amer: >60 mL/min (ref >60)
GLUCOSE: 109 mg/dL — AB (ref 65–99)
Potassium: 4.1 mmol/L (ref 3.5–5.1)
SODIUM: 138 mmol/L (ref 135–145)
Total Bilirubin: 0.4 mg/dL (ref 0.3–1.2)
Total Protein: 5.3 g/dL — ABNORMAL LOW (ref 6.5–8.1)

## 2016-07-29 LAB — PHOSPHORUS: Phosphorus: 3.2 mg/dL (ref 2.5–4.6)

## 2016-07-29 LAB — TROPONIN I: Troponin I: <0.03 ng/mL (ref <0.03)

## 2016-07-29 LAB — LACTIC ACID, PLASMA: LACTIC ACID, VENOUS: 0.8 mmol/L (ref 0.5–1.9)

## 2016-07-29 LAB — STREP PNEUMONIAE URINARY ANTIGEN: STREP PNEUMO URINARY ANTIGEN: NEGATIVE

## 2016-07-29 LAB — MAGNESIUM: Magnesium: 1.4 mg/dL — ABNORMAL LOW (ref 1.7–2.4)

## 2016-07-29 LAB — TSH: TSH: 0.202 u[IU]/mL — ABNORMAL LOW (ref 0.350–4.500)

## 2016-07-29 MED ORDER — METOPROLOL TARTRATE 25 MG PO TABS
25.0000 mg | ORAL_TABLET | Freq: Two times a day (BID) | ORAL | Status: DC
  Administered 2016-07-29 – 2016-07-30 (×2): 25 mg via ORAL
  Filled 2016-07-29 (×2): qty 1

## 2016-07-29 MED ORDER — SODIUM CHLORIDE 0.9 % IV SOLN
INTRAVENOUS | Status: DC
  Administered 2016-07-29 (×2): via INTRAVENOUS

## 2016-07-29 MED ORDER — MAGNESIUM SULFATE 2 GM/50ML IV SOLN
2.0000 g | Freq: Once | INTRAVENOUS | Status: AC
  Administered 2016-07-29: 2 g via INTRAVENOUS
  Filled 2016-07-29: qty 50

## 2016-07-29 MED ORDER — IPRATROPIUM-ALBUTEROL 0.5-2.5 (3) MG/3ML IN SOLN
3.0000 mL | Freq: Four times a day (QID) | RESPIRATORY_TRACT | Status: DC
  Administered 2016-07-29 – 2016-07-30 (×6): 3 mL via RESPIRATORY_TRACT
  Filled 2016-07-29 (×7): qty 3

## 2016-07-29 MED ORDER — ARFORMOTEROL TARTRATE 15 MCG/2ML IN NEBU
15.0000 ug | INHALATION_SOLUTION | Freq: Two times a day (BID) | RESPIRATORY_TRACT | Status: DC
  Administered 2016-07-29 – 2016-07-30 (×3): 15 ug via RESPIRATORY_TRACT
  Filled 2016-07-29 (×3): qty 2

## 2016-07-29 MED ORDER — DILTIAZEM HCL ER COATED BEADS 120 MG PO CP24
120.0000 mg | ORAL_CAPSULE | Freq: Every day | ORAL | Status: DC
  Administered 2016-07-29 – 2016-07-30 (×2): 120 mg via ORAL
  Filled 2016-07-29 (×2): qty 1

## 2016-07-29 MED ORDER — PREDNISONE 10 MG PO TABS
10.0000 mg | ORAL_TABLET | Freq: Every day | ORAL | Status: DC
  Administered 2016-07-30: 10 mg via ORAL
  Filled 2016-07-29: qty 1

## 2016-07-29 MED ORDER — STARCH (THICKENING) PO POWD
Freq: Three times a day (TID) | ORAL | Status: DC | PRN
  Filled 2016-07-29: qty 227

## 2016-07-29 MED ORDER — RESOURCE THICKENUP CLEAR PO POWD
Freq: Three times a day (TID) | ORAL | Status: DC | PRN
  Filled 2016-07-29: qty 125

## 2016-07-29 MED ORDER — METOPROLOL TARTRATE 5 MG/5ML IV SOLN
2.5000 mg | Freq: Once | INTRAVENOUS | Status: AC
  Administered 2016-07-29: 2.5 mg via INTRAVENOUS
  Filled 2016-07-29: qty 5

## 2016-07-29 MED ORDER — SODIUM CHLORIDE 3 % IN NEBU
4.0000 mL | INHALATION_SOLUTION | Freq: Two times a day (BID) | RESPIRATORY_TRACT | Status: DC
  Administered 2016-07-29 (×2): 4 mL via RESPIRATORY_TRACT
  Administered 2016-07-30: 15 mL via RESPIRATORY_TRACT
  Filled 2016-07-29 (×4): qty 4

## 2016-07-29 NOTE — Progress Notes (Signed)
PROGRESS NOTE    Malik Zuniga   VCB:[id number]  DOB: 10-Mar-1945  DOA: 07/28/2016 PCP: [name], MD   Brief Narrative:  Malik Zuniga is a 71 y.o. 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 He presents for confusion, lethargy, left sided chest pain and was found in the ER to be hypotensive with BP in 60s, sinus tachycardia, leukocytosis and left lower lobe opacity, an elevated procalcitonin and mild  AKI. He was admitted for treatment of recurrent pneumonia due to suspected aspiration.   Subjective: Feels better today. Noted to be on 3 L O2- states he only wears O2 at night at home. Has a mild cough. No dyspnea at rest.  Wants to go home.   Assessment & Plan:   Principal Problem:  Aspiration pneumonia   Severe Sepsis  Acute hypoxic respiratory failure - Zosyn given in ER- can continue - stop Vancomycin - he is asking if he can go home today - wean O 2 as able- currently on 3 L - not on O2 in the daytime he uses 2.5 L at night - PCCM has seen him today and made recommendations to continue chest PT vest, Mucinex and chronic Prednisone at 10 mg daily - transfer out of SDU today    Active Problems:   Bronchiectasis without acute exacerbation - due to chronic issues with aspiration - stable    RV dysfunction - noted on ECHO on 6/17    Bipolar disorder - stable    Hypotension with h/o Essential hypertension - resume Cardizem CD (at lower dose to prevent recurrence of A-fib) and continue to hold Metoprolol and Losartan for today    CAD (coronary artery disease) - cont Statin, pradaxa (not on aspirin)    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    Paroxysmal atrial fibrillation - cont Cardizem at lower dose-  if BP improves, can increase Cardizem and add back Metoprolol - cont Pradaxa   DVT prophylaxis: Pradaxa Code Status: no CPR and No Defibrillation or Cardioversion Family Communication:  Disposition Plan: transfer to telemetry  Consultants:   PCCM Procedures:    Antimicrobials:  Anti-infectives    Start     Dose/Rate Route Frequency Ordered Stop   07/29/16 0600  vancomycin (VANCOCIN) IVPB 750 mg/150 ml premix  Status:  Discontinued     750 mg 150 mL/hr over 60 Minutes Intravenous Every 12 hours 07/28/16 1758 07/29/16 0813   07/29/16 0100  piperacillin-tazobactam (ZOSYN) IVPB 3.375 g     3.375 g 12.5 mL/hr over 240 Minutes Intravenous Every 8 hours 07/28/16 1758     07/28/16 1645  piperacillin-tazobactam (ZOSYN) IVPB 3.375 g     3.375 g 100 mL/hr over 30 Minutes Intravenous  Once 07/28/16 1630 07/28/16 1721   07/28/16 1645  vancomycin (VANCOCIN) IVPB 1000 mg/200 mL premix     1,000 mg 200 mL/hr over 60 Minutes Intravenous  Once 07/28/16 1630 07/28/16 1840       Objective: Vitals:   07/29/16 1000 07/29/16 1100 07/29/16 1159 07/29/16 1356  BP:   134/64   Pulse: (!) 115 (!) 114 (!) 114   Resp: (!) 23 (!) 28 (!) 26   Temp:   98.2 F (36.8 C)   TempSrc:   Oral  SpO2: 97% 95% 96% 91%  Weight:      Height:        Intake/Output Summary (Last 24 hours) at 07/29/16 1422 Last data filed at 07/29/16 1100  Gross per 24 hour  Intake          3515.42 ml  Output              550 ml  Net          2965.42 ml   Filed Weights   07/28/16 1534 07/28/16 2130 07/29/16 0335  Weight: 73.9 kg (163 lb) 79.3 kg (174 lb 13.2 oz) 82.6 kg (182 lb 1.6 oz)    Examination: General exam: Appears comfortable  HEENT: PERRLA, oral mucosa moist, no sclera icterus or thrush Respiratory system: Clear to auscultation. Respiratory effort normal. Cardiovascular system: S1 & S2 heard, RRR.  No murmurs  Gastrointestinal system: Abdomen soft, non-tender, nondistended. Normal bowel sound. No  organomegaly Central nervous system: Alert and oriented. No focal neurological deficits. Extremities: No cyanosis, clubbing or edema Skin: No rashes or ulcers Psychiatry:  Mood & affect appropriate.     Data Reviewed: I have personally reviewed following labs and imaging studies  CBC:  Recent Labs Lab 07/28/16 1629 07/29/16 0329  WBC 13.4* 14.7*  NEUTROABS 11.0*  --   HGB 10.9* 10.7*  HCT 33.5* 32.3*  MCV 96.0 96.4  PLT 190 412   Basic Metabolic Panel:  Recent Labs Lab 07/28/16 1629 07/29/16 0329  NA 139 138  K 4.9 4.1  CL 106 108  CO2 26 23  GLUCOSE 80 109*  BUN 28* 21*  CREATININE 1.67* 0.97  CALCIUM 7.6* 7.6*  MG  --  1.4*  PHOS  --  3.2   GFR: Estimated Creatinine Clearance: 72.1 mL/min (by C-G formula based on SCr of 0.97 mg/dL). Liver Function Tests:  Recent Labs Lab 07/28/16 1629 07/29/16 0329  AST 40 28  ALT 39 35  ALKPHOS 37* 36*  BILITOT 0.5 0.4  PROT 5.2* 5.3*  ALBUMIN 2.8* 2.6*   No results for input(s): LIPASE, AMYLASE in the last 168 hours. No results for input(s): AMMONIA in the last 168 hours. Coagulation Profile:  Recent Labs Lab 07/28/16 2214  INR 1.25   Cardiac Enzymes:  Recent Labs Lab 07/28/16 2214 07/29/16 0329 07/29/16 0923  TROPONINI <0.03 <0.03 <0.03   BNP (last 3 results) No results for input(s): PROBNP in the last 8760 hours. HbA1C: No results for input(s): HGBA1C in the last 72 hours. CBG: No results for input(s): GLUCAP in the last 168 hours. Lipid Profile: No results for input(s): CHOL, HDL, LDLCALC, TRIG, CHOLHDL, LDLDIRECT in the last 72 hours. Thyroid Function Tests:  Recent Labs  07/29/16 0329  TSH 0.202*   Anemia Panel: No results for input(s): VITAMINB12, FOLATE, FERRITIN, TIBC, IRON, RETICCTPCT in the last 72 hours. Urine analysis:    Component Value Date/Time   COLORURINE YELLOW 07/28/2016 1630   APPEARANCEUR CLEAR 07/28/2016 1630   LABSPEC 1.019 07/28/2016 1630   PHURINE 5.0  07/28/2016 1630   GLUCOSEU NEGATIVE 07/28/2016 1630   HGBUR NEGATIVE 07/28/2016 1630   BILIRUBINUR NEGATIVE 07/28/2016 1630   KETONESUR NEGATIVE 07/28/2016 1630   PROTEINUR NEGATIVE 07/28/2016 1630   UROBILINOGEN 0.2 10/23/2013 1127   NITRITE NEGATIVE 07/28/2016 1630   LEUKOCYTESUR NEGATIVE 07/28/2016 1630   Sepsis Labs: @LABRCNTIP (procalcitonin:4,lacticidven:4) ) Recent Results (from the past 240 hour(s))  MRSA PCR Screening     Status: None   Collection Time: 07/28/16  9:27 PM  Result Value Ref Range Status   MRSA by PCR NEGATIVE NEGATIVE Final    Comment:        The GeneXpert MRSA Assay (FDA approved for NASAL specimens only), is one component of a comprehensive MRSA colonization surveillance program. It is not intended to diagnose MRSA infection nor to guide or monitor treatment for MRSA infections.          Radiology Studies: Dg Chest Port 1 View  Result Date: 07/28/2016 CLINICAL DATA:  71 year old male was unresponsive at 1400 hours today. Hypoxia. Shortness of breath and weakness. Prior aspiration pneumonia. EXAM: PORTABLE CHEST 1 VIEW COMPARISON:  Chest CT 04/16/2016, portable chest radiograph 01/21/2016, and earlier. FINDINGS: Portable AP semi upright view at 1631 hours. The patient is rotated toward the left similar to the prior portable film. Confluent and reticulonodular opacity at the left lung base is new from the prior radiograph although likely superimposed on chronic lingula scarring and atelectasis. Superimposed platelike atelectasis in the left mid lung. No pneumothorax or pulmonary edema. No definite pleural effusion. Stable cardiac size and mediastinal contours. Chronic thoracolumbar fusion hardware. IMPRESSION: Acute airspace opacity at the left lung base. Consider aspiration pneumonia in this clinical setting. No definite pleural effusion. Electronically Signed   By: Genevie Ann M.D.   On: 07/28/2016 17:31      Scheduled Meds: . arformoterol  15 mcg  Nebulization BID  . atorvastatin  20 mg Oral q1800  . budesonide  0.5 mg Nebulization BID  . dabigatran  150 mg Oral Q12H  . diltiazem  120 mg Oral Daily  . divalproex  1,000 mg Oral QHS  . DULoxetine  120 mg Oral Daily  . famotidine  10 mg Oral BID  . feeding supplement (ENSURE ENLIVE)  237 mL Oral Daily  . guaiFENesin  600 mg Oral BID  . ipratropium-albuterol  3 mL Nebulization Q6H  . loratadine  10 mg Oral Daily  . montelukast  10 mg Oral QHS  . pantoprazole  40 mg Oral Daily  . [START ON 07/30/2016] predniSONE  10 mg Oral Q breakfast  . primidone  250 mg Oral BID  . sodium chloride flush  3 mL Intravenous Q12H  . sodium chloride HYPERTONIC  4 mL Nebulization BID   Continuous Infusions: . sodium chloride 75 mL/hr at 07/29/16 1100  . piperacillin-tazobactam (ZOSYN)  IV Stopped (07/29/16 1342)     LOS: 1 day    Time spent in minutes: 35    Debbe Odea, MD [facility] Pager: [email or web address] Password [email or web address] 07/29/2016, 2:22 PM

## 2016-07-29 NOTE — Consult Note (Signed)
Name: Malik Zuniga MRN: [id number] DOB: 31-May-1945    ADMISSION DATE:  07/28/2016 CONSULTATION DATE:  6/18  REFERRING MD :  Roel Cluck   CHIEF COMPLAINT:  Acute on chronic respiratory failure    HISTORY OF PRESENT ILLNESS:   This is a chronically ill appearing 71 year old male w/ sig pulmonary history of: COPD (FEV1 47% predicted); maintained on brovana/budesonide and pred 10mg /d, bronchiectasis from recurrent aspiration (mangaged w/ chest vest and hypertonic nebs), dysphagia after a stroke (managed by dysphagia precautions) and chronic respiratory failure on oxygen 2.5 liters. He is followed by [facility].  Was in his usual state of health until day of admit (6/18) when he began that am  to notice left sided chest pain w/ deep breath. Later that day wife & palliative care nurse found him to be confused and difficult to wake up. They checked his blood pressure and found him to be 65/40. EMS was called he was given 1.5 liters NS in route to ER. Initial pulse ox was 87%. He was treated w/ IVFs and responded to fluid resuscitation efforts, oxygen was titrated and abx started. He was admitted to the intensive care/SDU. Given his pulmonary history PCCM was asked to see given concern for decompensation.   SIGNIFICANT EVENTS    STUDIES:    PAST MEDICAL HISTORY :   has a past medical history of Anemia associated with acute blood loss; Angioedema (10/31/2010); Anxiety; Aspiration pneumonia ([place]) (2010); Asthma; Bacteremia due to Pseudomonas (10/04/2011); Bipolar affective disorder ([place]); C V A / STROKE (02/20/2010); Chronic back pain; Constipation; Coronary artery disease; Depression; Emphysema of lung (HCC); GERD (gastroesophageal reflux disease); Headache, chronic daily; History of bronchitis; History of colon polyps; History of migraine; History of prostate cancer (2004); Hyperlipidemia; Hypertension; Insomnia; Lung cancer ([place]) (2016); Mood change ([place]); Myocardial infarction ([place]) (04/1998);  Neuromuscular disorder ([place]) (1998); On home O2; Shortness of breath; Stroke ([place]) (1998); and Tremor.  has a past surgical history that includes Cholecystectomy (1996); Craniotomy (1999); Coronary angioplasty with stent (2000); Hand surgery (1989); Ulnar nerve repair (1998); Gastrostomy w/ feeding tube (2008); Prostatectomy (04/2001); Coronary angioplasty with stent (04/1998); Carpal tunnel release (1998); Spine surgery; Brain surgery (1999); Back surgery (08/2004; 02/2005; 04/2006; 06/2007; 7/20101); Esophagogastroduodenoscopy (N/A, 07/23/2012); Colonoscopy; Back surgery (05/2013); and DG BIOPSY LUNG. Prior to Admission medications   Medication Sig Start Date End Date Taking? Authorizing Provider  ARIPiprazole (ABILIFY) 10 MG tablet Take 5 mg by mouth daily.  05/09/14  Yes [provider]  aspirin-acetaminophen-caffeine (EXCEDRIN MIGRAINE) [phone] MG tablet Take 2 tablets by mouth every 6 (six) hours as needed for headache.   Yes [provider]  atorvastatin (LIPITOR) 20 MG tablet Take 1 tablet (20 mg total) by mouth daily at 6 PM. 03/07/16  Yes Belva Crome, MD  budesonide (PULMICORT) 0.25 MG/2ML nebulizer solution Take 4 mLs (0.5 mg total) by nebulization 2 (two) times daily. 12/07/13  Yes Clance, Armando Reichert, MD  cetirizine (ZYRTEC) 10 MG tablet Take 10 mg by mouth daily.   Yes [provider]  cholecalciferol (VITAMIN D) 1000 UNITS tablet Take 1,000 Units by mouth every morning.    Yes [provider]  dabigatran (PRADAXA) 150 MG CAPS capsule Take 1 capsule (150 mg total) by mouth every 12 (twelve) hours. 03/19/16  Yes Belva Crome, MD  diazepam (VALIUM) 5 MG tablet Take 1 tablet (5 mg total) by mouth every 8 (eight) hours as needed for anxiety or muscle spasms. scheduled 06/29/15  Yes [name]  L, NP  diltiazem (CARDIZEM CD) 240 MG 24 hr capsule Take 1 capsule (240 mg total) by mouth daily. 07/14/16  Yes Belva Crome, MD  divalproex (DEPAKOTE) 500 MG 24 hr tablet  Take 1,000 mg by mouth at bedtime.    Yes [provider]  docusate sodium (COLACE) 100 MG capsule Take 200 mg by mouth at bedtime.    Yes [provider]  DULoxetine (CYMBALTA) 60 MG capsule Take 120 mg by mouth daily.    Yes [provider]  feeding supplement, ENSURE ENLIVE, (ENSURE ENLIVE) LIQD Take 237 mLs by mouth daily.   Yes [provider]  fluticasone (FLONASE) 50 MCG/ACT nasal spray Place 2 sprays into both nostrils daily as needed for allergies or rhinitis.   Yes [provider]  HYDROcodone-acetaminophen (NORCO) 10-325 MG tablet Take 1-2 tablets by mouth every 4 (four) hours as needed for pain. 01/07/16  Yes [provider]  ipratropium-albuterol (DUONEB) 0.5-2.5 (3) MG/3ML SOLN Take 3 mLs by nebulization every 6 (six) hours. 12/07/13  Yes Clance, Armando Reichert, MD  losartan (COZAAR) 50 MG tablet Take 50 mg by mouth daily.   Yes [provider]  Maltodextrin-Xanthan Gum (RESOURCE THICKENUP CLEAR) POWD Take 1 g by mouth as needed. 01/24/16  Yes Eugenie Filler, MD  metoprolol tartrate (LOPRESSOR) 50 MG tablet Take 1 tablet (50 mg total) by mouth 2 (two) times daily. 07/15/16  Yes Belva Crome, MD  omeprazole (PRILOSEC) 40 MG capsule Take 40 mg by mouth 2 (two) times daily.    Yes [provider]  polyethylene glycol (MIRALAX / GLYCOLAX) packet Take 17 g by mouth 3 (three) times daily as needed for mild constipation.   Yes [provider]  predniSONE (DELTASONE) 10 MG tablet TAKE 1 TABLET (10 MG TOTAL) BY MOUTH DAILY WITH BREAKFAST. 05/28/16  Yes [name], MD  promethazine (PHENERGAN) 25 MG tablet Take one (1) tablet by mouth every 6 to 8 hours as needed for nausea. 04/15/16  Yes [provider]  ranitidine (ZANTAC) 150 MG tablet Take 150-300 mg by mouth 2 (two) times daily as needed for heartburn.   Yes [provider]  sodium chloride HYPERTONIC 3 % nebulizer solution Take by  nebulization 2 (two) times daily. 04/03/16  Yes [name], MD  tiZANidine (ZANAFLEX) 4 MG tablet TAKE 1 TABLET BY MOUTH EVERY 8 HOURS AS NEEDED FOR MUSCLE SPASMS. 05/21/16  Yes [name], MD  cyclobenzaprine (FLEXERIL) 5 MG tablet Take 1 tablet (5 mg total) by mouth 3 (three) times daily as needed for muscle spasms. Patient not taking: Reported on 07/28/2016 01/24/16   Eugenie Filler, MD  montelukast (SINGULAIR) 10 MG tablet TAKE 1 TABLET AT BEDTIME 07/29/16   [name], MD  nitroGLYCERIN (NITROSTAT) 0.4 MG SL tablet PLACE 1 TABLET (0.4 MG TOTAL) UNDER THE TONGUE EVERY 5 (FIVE) MINUTES AS NEEDED FOR CHEST PAIN. 08/06/15   Belva Crome, MD  primidone (MYSOLINE) 250 MG tablet TAKE 1 TABLET TWICE DAILY 07/29/16   [name], MD   Allergies  Allergen Reactions  . Lisinopril Swelling    ANGIOEDEMA  . Lamictal [Lamotrigine] Other (See Comments)    Weakness/difficulty swallowing  . Ambien [Zolpidem] Other (See Comments)    Unknown reaction    FAMILY HISTORY:  family history includes Cancer in his father; Heart disease in his mother. SOCIAL HISTORY:  reports that he quit smoking about 4 years ago. His smoking use included Cigarettes.  He has a 78.00 pack-year smoking history. He has never used smokeless tobacco. He reports that he does not drink alcohol or use drugs.  REVIEW OF SYSTEMS (bolds positive)   Constitutional: Negative for fever, chills, weight loss, malaise/fatigue and diaphoresis.  HENT: Negative for hearing loss, ear pain, nosebleeds, congestion, sore throat, neck pain, tinnitus and ear discharge.   Eyes: Negative for blurred vision, double vision, photophobia, pain, discharge and redness.  Respiratory:  cough, hemoptysis, sputum production, shortness of breath, wheezing and stridor.   Cardiovascular:chest pain, palpitations, orthopnea, claudication, leg swelling and PND.  Gastrointestinal: Negative for heartburn, nausea, vomiting, abdominal  pain, diarrhea, constipation, blood in stool and melena.  Genitourinary: Negative for dysuria, urgency, frequency, hematuria and flank pain.  Musculoskeletal: Negative for myalgias, back pain, joint pain and falls.  Skin: Negative for itching and rash.  Neurological: Negative for dizziness, tingling, tremors, sensory change, speech change, focal weakness, seizures, loss of consciousness, weakness and headaches. confusion  Endo/Heme/Allergies: Negative for environmental allergies and polydipsia. Does not bruise/bleed easily.  SUBJECTIVE:  Feels better already  VITAL SIGNS: Temp:  [97.5 F (36.4 C)-97.6 F (36.4 C)] 97.5 F (36.4 C) (06/19 0335) Pulse Rate:  [72-124] 124 (06/19 0900) Resp:  [13-32] 32 (06/19 0900) BP: (72-118)/(48-70) 106/52 (06/19 0800) SpO2:  [85 %-98 %] 95 % (06/19 0929) Weight:  [163 lb (73.9 kg)-182 lb 1.6 oz (82.6 kg)] 182 lb 1.6 oz (82.6 kg) (06/19 0335)  PHYSICAL EXAMINATION: General appearance:  Chronically ill appearing 71 Year old  Male, well nourished/ NAD, conversant  Eyes: anicteric sclerae, moist conjunctivae; PERRL, EOMI bilaterally. Mouth:  membranes and no mucosal ulcerations;  Neck: neck contracted; neck supple, no JVD Lungs/chest: sp garbled. Scattered rhonchi decreased left base, with normal respiratory effort and no intercostal retractions CV: RRR, no MRGs  Abdomen: Soft, non-tender; no masses or HSM Extremities: No peripheral edema or extremity lymphadenopathy Skin: Normal temperature, turgor and texture; no rash, ulcers or subcutaneous nodules Neuro/Psych: Appropriate affect, alert and oriented to person, place and time   Recent Labs Lab 07/28/16 1629 07/29/16 0329  NA 139 138  K 4.9 4.1  CL 106 108  CO2 26 23  BUN 28* 21*  CREATININE 1.67* 0.97  GLUCOSE 80 109*    Recent Labs Lab 07/28/16 1629 07/29/16 0329  HGB 10.9* 10.7*  HCT 33.5* 32.3*  WBC 13.4* 14.7*  PLT 190 169   Dg Chest Port 1 View  Result Date:  07/28/2016 CLINICAL DATA:  71 year old male was unresponsive at 1400 hours today. Hypoxia. Shortness of breath and weakness. Prior aspiration pneumonia. EXAM: PORTABLE CHEST 1 VIEW COMPARISON:  Chest CT 04/16/2016, portable chest radiograph 01/21/2016, and earlier. FINDINGS: Portable AP semi upright view at 1631 hours. The patient is rotated toward the left similar to the prior portable film. Confluent and reticulonodular opacity at the left lung base is new from the prior radiograph although likely superimposed on chronic lingula scarring and atelectasis. Superimposed platelike atelectasis in the left mid lung. No pneumothorax or pulmonary edema. No definite pleural effusion. Stable cardiac size and mediastinal contours. Chronic thoracolumbar fusion hardware. IMPRESSION: Acute airspace opacity at the left lung base. Consider aspiration pneumonia in this clinical setting. No definite pleural effusion. Electronically Signed   By: Genevie Ann M.D.   On: 07/28/2016 17:31    ASSESSMENT / PLAN:  Acute on Chronic hypoxic respiratory failure Aspiration pneumonia  COPD (FEV1 47% predicted)-->chronically on 10mg  pred and 2.5 liters oxygen  Bronchiectasis: maintained on hypertonic saline nebs/brovana/budesonide and  chest vest  Dysphagia  Chest pain  Stage IA caner treated w/ SBRT (2016) Remote CVA  Sepsis-->responded to volume resuscitation  Chronic systolic and diastolic heart failure (EF 48%) paroxysmal AF (chronically on pradaxa and ccb)  Discussion  Acute on chronic hypoxic respiratory failure in setting of recurrent aspiration PNA vs CAP. Favor aspiration w/ known h/o dysphagia, complicated by poor cough mechanics and mucociliary clearance   Plan/rec Cont empiric abx (vanc/zosyn day 1) Can likely stop vanc in next 24 hrs Cont aspiration precautions  Add chest vest Change BD regimen to brovana/budesonide and hypertonic saline Wean oxygen  Cont Flutter Mobilize  If remains hemodynamically stable  would rapidly wean steroids to baseline pred dosing  -all other rx per IM service -we are available as needed  Erick Colace ACNP-BC [place] Pager # [phone] OR # [phone] if no answer  07/29/2016, 9:50 AM

## 2016-07-29 NOTE — Care Management Note (Signed)
Case Management Note  Patient Details  Name: Malik Zuniga MRN: [id number] Date of Birth: 1945-04-12  Subjective/Objective:                  71 y.o. 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 This is typical for his presentation of aspiration pneumonia. Wife and palliative care nurse had hard time waking him up and when they check his blood pressure was low Palliative care nurse notified the patient was hypotensive with systolic blood pressure down to 65/40. EMS was called 1.5 L normal saline was given  Action/Plan: Date:  July 29, 2016 Chart reviewed for concurrent status and case management needs. Will continue to follow patient progress. Discharge Planning: following for needs Expected discharge date: [id number] Velva Harman, BSN, [place], [place] Expected Discharge Date:   (unknown)               Expected Discharge Plan:  Home/Self Care  In-House Referral:     Discharge planning Services  CM Consult  Post Acute Care Choice:    Choice offered to:     DME Arranged:    DME Agency:     HH Arranged:    [place] Agency:     Status of Service:  In process, will continue to follow  If discussed at Long Length of Stay Meetings, dates discussed:    Additional Comments:  Leeroy Cha, RN 07/29/2016, 8:33 AM

## 2016-07-29 NOTE — Progress Notes (Signed)
PHARMACY - PHYSICIAN COMMUNICATION CRITICAL VALUE ALERT - BLOOD CULTURE IDENTIFICATION (BCID)  Results for orders placed or performed during the hospital encounter of 07/28/16  Blood Culture ID Panel (Reflexed) (Collected: 07/28/2016  4:30 PM)  Result Value Ref Range   Enterococcus species NOT DETECTED NOT DETECTED   Listeria monocytogenes NOT DETECTED NOT DETECTED   Staphylococcus species DETECTED (A) NOT DETECTED   Staphylococcus aureus NOT DETECTED NOT DETECTED   Methicillin resistance NOT DETECTED NOT DETECTED   Streptococcus species NOT DETECTED NOT DETECTED   Streptococcus agalactiae NOT DETECTED NOT DETECTED   Streptococcus pneumoniae NOT DETECTED NOT DETECTED   Streptococcus pyogenes NOT DETECTED NOT DETECTED   Acinetobacter baumannii NOT DETECTED NOT DETECTED   Enterobacteriaceae species NOT DETECTED NOT DETECTED   Enterobacter cloacae complex NOT DETECTED NOT DETECTED   Escherichia coli NOT DETECTED NOT DETECTED   Klebsiella oxytoca NOT DETECTED NOT DETECTED   Klebsiella pneumoniae NOT DETECTED NOT DETECTED   Proteus species NOT DETECTED NOT DETECTED   Serratia marcescens NOT DETECTED NOT DETECTED   Haemophilus influenzae NOT DETECTED NOT DETECTED   Neisseria meningitidis NOT DETECTED NOT DETECTED   Pseudomonas aeruginosa NOT DETECTED NOT DETECTED   Candida albicans NOT DETECTED NOT DETECTED   Candida glabrata NOT DETECTED NOT DETECTED   Candida krusei NOT DETECTED NOT DETECTED   Candida parapsilosis NOT DETECTED NOT DETECTED   Candida tropicalis NOT DETECTED NOT DETECTED    Name of physician (or Provider) ContactedTylene Fantasia, NP  Changes to prescribed antibiotics required: none- likely contaminant  Biagio Borg 07/29/2016  8:42 PM

## 2016-07-29 NOTE — Evaluation (Addendum)
Physical Therapy Evaluation Patient Details Name: Malik Zuniga MRN: [id number] DOB: Jul 02, 1945 Today's Date: 07/29/2016   History of Present Illness  Malik Zuniga is a 71 y.o. 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 Pt currently with functional limitations due to the deficits listed below (see PT Problem List).  Pt will benefit from skilled PT to increase their independence and safety with mobility to allow discharge to the venue listed below.  Will follow in acute setting so pt may return home at his baseline for functional mobility VS: HR 117--127--110  O2 sats 90--86--90on 2L incr WOB with activity   Follow Up Recommendations No PT follow up (pt does not desire HHPT)    Equipment Recommendations  None recommended by PT    Recommendations for Other Services       Precautions / Restrictions Precautions Precautions: Fall Restrictions Weight Bearing Restrictions: No      Mobility  Bed Mobility Overal bed mobility: Needs Assistance Bed Mobility: Supine to Sit     Supine to sit: HOB elevated;Min guard     General bed mobility comments: for lines and safety  Transfers Overall transfer level: Needs assistance Equipment used: Rolling walker (2 wheeled) Transfers: Sit to/from [facility] Sit to Stand: Min guard Stand pivot transfers: Min assist;+2 safety/equipment       General transfer comment: cues for safety, assist for balance   Ambulation/Gait Ambulation/Gait assistance: Min assist;+2 safety/equipment Ambulation Distance (Feet): 5 Feet (steps fwd/back bed to chair) Assistive device: Rolling walker (2 wheeled) Gait Pattern/deviations: Trunk flexed;Shuffle     General Gait  Details: cues for improved trunk extension however pt  flexed during gait at baseline per pt and wife  Stairs            Wheelchair Mobility    Modified Rankin (Stroke Patients Only)       Balance Overall balance assessment: Needs assistance   Sitting balance-Leahy Scale: Fair       Standing balance-Leahy Scale: Fair Standing balance comment: reliant on UEs for dynamic activity                             Pertinent Vitals/Pain Pain Assessment: 0-10 Pain Score: 3  Pain Location: back Pain Descriptors / Indicators: Aching Pain Intervention(s): Limited activity within patient's tolerance;Monitored during session    [place] expects to be discharged to:: Private residence Living Arrangements: Spouse/significant other Available Help at Discharge: Family Type of Home: House Home Access: Ramped entrance     Home Layout: One level Home Equipment: Environmental consultant - 2 wheels;Walker - 4 wheels;Cane - single point;Bedside commode;Grab bars - tub/shower;Tub bench;Wheelchair - manual      Prior Function Level of Independence: Needs assistance;Independent with assistive device(s)   Gait / Transfers Assistance Needed: Uses WC in the community w/ wife pushing.  Ambualtes using RW.   ADL's / Homemaking Assistance Needed: Pt states he dresses himself and bathes self after set up, wife stays nearby in bathroom during ADLs;  Pts wife does all homecare and cooking/driving.        Hand Dominance        Extremity/Trunk Assessment   Upper Extremity Assessment Upper Extremity Assessment: Generalized weakness  Lower Extremity Assessment Lower Extremity Assessment: Generalized weakness    Cervical / Trunk Assessment Cervical / Trunk Assessment: Kyphotic;Other exceptions Cervical / Trunk Exceptions: fwd head posture/cervical flexion  Communication      Cognition Arousal/Alertness: Awake/alert Behavior During Therapy: WFL for tasks  assessed/performed Overall Cognitive Status: Within Functional Limits for tasks assessed                                        General Comments      Exercises     Assessment/Plan    PT Assessment Patient needs continued PT services  PT Problem List Decreased strength;Decreased activity tolerance;Decreased balance;Decreased mobility       PT Treatment Interventions DME instruction;Gait training;Functional mobility training;Therapeutic activities;Therapeutic exercise;Patient/family education    PT Goals (Current goals can be found in the Care Plan section)  Acute Rehab PT Goals Patient Stated Goal: to move around PT Goal Formulation: With patient/family Time For Goal Achievement: 07/29/16 Potential to Achieve Goals: Good    Frequency Min 3X/week   Barriers to discharge        Co-evaluation               AM-PAC PT "6 Clicks" Daily Activity  Outcome Measure Difficulty turning over in bed (including adjusting bedclothes, sheets and blankets)?: Total Difficulty moving from lying on back to sitting on the side of the bed? : Total Difficulty sitting down on and standing up from a chair with arms (e.g., wheelchair, bedside commode, etc,.)?: Total Help needed moving to and from a bed to chair (including a wheelchair)?: A Little Help needed walking in hospital room?: A Little Help needed climbing 3-5 steps with a railing? : A Lot 6 Click Score: 11    End of Session Equipment Utilized During Treatment: Gait belt Activity Tolerance: Patient tolerated treatment well;Other (comment) (incr HR) Patient left: in chair;with call bell/phone within reach;with family/visitor present Nurse Communication: Mobility status PT Visit Diagnosis: Muscle weakness (generalized) (M62.81);Difficulty in walking, not elsewhere classified (R26.2)    Time: 4315-4008 PT Time Calculation (min) (ACUTE ONLY): 15 min   Charges:   PT Evaluation $PT Eval Moderate Complexity: 1  Procedure     PT G CodesKenyon Ana, PT Pager: [phone] 07/29/2016   [facility] 07/29/2016, 11:23 AM

## 2016-07-29 NOTE — Progress Notes (Signed)
[place] Progress Note Patient Name: Malik Zuniga DOB: 1945/06/13 MRN: [id number]   Date of Service  07/29/2016  HPI/Events of Note  Mg++ = 1.4 and Creatinine = 0.97.  eICU Interventions  Will replace Mg++.     Intervention Category Major Interventions: Electrolyte abnormality - evaluation and management  Lyrique Hakim Eugene 07/29/2016, 5:15 AM

## 2016-07-29 NOTE — Consult Note (Signed)
   [facility] CM Inpatient Consult   07/29/2016  RADWAN COWLEY 1945-08-26 [id number]   Notification from [place] with [facility]( outpatient palliative care program administered by [facility]) that Mr. Mau is active with their program.  Will make inpatient RNCM aware of above.  [facility] telephone number is [phone].   Marthenia Rolling, MSN-Ed, RN,BSN [facility] Liaison [phone]

## 2016-07-30 DIAGNOSIS — Z8673 Personal history of transient ischemic attack (TIA), and cerebral infarction without residual deficits: Secondary | ICD-10-CM

## 2016-07-30 DIAGNOSIS — C3431 Malignant neoplasm of lower lobe, right bronchus or lung: Secondary | ICD-10-CM

## 2016-07-30 DIAGNOSIS — F310 Bipolar disorder, current episode hypomanic: Secondary | ICD-10-CM

## 2016-07-30 DIAGNOSIS — I959 Hypotension, unspecified: Secondary | ICD-10-CM

## 2016-07-30 DIAGNOSIS — R0902 Hypoxemia: Secondary | ICD-10-CM

## 2016-07-30 DIAGNOSIS — I480 Paroxysmal atrial fibrillation: Secondary | ICD-10-CM

## 2016-07-30 DIAGNOSIS — I672 Cerebral atherosclerosis: Secondary | ICD-10-CM

## 2016-07-30 DIAGNOSIS — Z7901 Long term (current) use of anticoagulants: Secondary | ICD-10-CM

## 2016-07-30 DIAGNOSIS — I25118 Atherosclerotic heart disease of native coronary artery with other forms of angina pectoris: Secondary | ICD-10-CM

## 2016-07-30 DIAGNOSIS — I5042 Chronic combined systolic (congestive) and diastolic (congestive) heart failure: Secondary | ICD-10-CM

## 2016-07-30 LAB — BASIC METABOLIC PANEL
Anion gap: 6 (ref 5–15)
BUN: 11 mg/dL (ref 6–20)
CALCIUM: 8.4 mg/dL — AB (ref 8.9–10.3)
CO2: 27 mmol/L (ref 22–32)
Chloride: 107 mmol/L (ref 101–111)
Creatinine, Ser: 0.66 mg/dL (ref 0.61–1.24)
GFR calc Af Amer: >60 mL/min (ref >60)
Glucose, Bld: 87 mg/dL (ref 65–99)
Potassium: 3.1 mmol/L — ABNORMAL LOW (ref 3.5–5.1)
Sodium: 140 mmol/L (ref 135–145)

## 2016-07-30 LAB — CBC
HCT: 31.9 % — ABNORMAL LOW (ref 39.0–52.0)
Hemoglobin: 10.7 g/dL — ABNORMAL LOW (ref 13.0–17.0)
MCH: 30.9 pg (ref 26.0–34.0)
MCHC: 33.5 g/dL (ref 30.0–36.0)
MCV: 92.2 fL (ref 78.0–100.0)
PLATELETS: 175 10*3/uL (ref 150–400)
RBC: 3.46 MIL/uL — ABNORMAL LOW (ref 4.22–5.81)
RDW: 14.3 % (ref 11.5–15.5)
WBC: 11.8 10*3/uL — AB (ref 4.0–10.5)

## 2016-07-30 LAB — MAGNESIUM: Magnesium: 2 mg/dL (ref 1.7–2.4)

## 2016-07-30 LAB — T4, FREE: FREE T4: 0.79 ng/dL (ref 0.61–1.12)

## 2016-07-30 MED ORDER — AMOXICILLIN-POT CLAVULANATE 875-125 MG PO TABS: 1.0000 | ORAL_TABLET | Freq: Two times a day (BID) | ORAL | 0 refills | Status: DC

## 2016-07-30 MED ORDER — METOPROLOL TARTRATE 25 MG PO TABS: 25.0000 mg | ORAL_TABLET | Freq: Two times a day (BID) | ORAL | 0 refills | Status: DC

## 2016-07-30 MED ORDER — AMOXICILLIN-POT CLAVULANATE 875-125 MG PO TABS: 1.0000 | ORAL_TABLET | Freq: Two times a day (BID) | ORAL | Status: DC

## 2016-07-30 MED ORDER — DILTIAZEM HCL ER COATED BEADS 120 MG PO CP24: 120.0000 mg | ORAL_CAPSULE | Freq: Every day | ORAL | 0 refills | Status: DC

## 2016-07-30 MED ORDER — PIPERACILLIN-TAZOBACTAM 3.375 G IVPB
3.3750 g | Freq: Three times a day (TID) | INTRAVENOUS | Status: AC
  Administered 2016-07-30: 3.375 g via INTRAVENOUS
  Filled 2016-07-30: qty 50

## 2016-07-30 MED ORDER — GUAIFENESIN ER 600 MG PO TB12: 600.0000 mg | ORAL_TABLET | Freq: Two times a day (BID) | ORAL | 0 refills | Status: DC

## 2016-07-30 MED ORDER — POTASSIUM CHLORIDE CRYS ER 20 MEQ PO TBCR
40.0000 meq | EXTENDED_RELEASE_TABLET | Freq: Two times a day (BID) | ORAL | Status: DC
  Administered 2016-07-30: 40 meq via ORAL
  Filled 2016-07-30: qty 2

## 2016-07-30 NOTE — Consult Note (Signed)
[facility]: Follow up on active home care pt with [facility]. He states he is feeling much better. He reports he will be going home with steroids and antibiotics. He was encouraged to always call when he starts feeling bad so we could visit and possibly stop him from having to come into the hospital. He verbalized understanding. We will continue to follow at home. Webb Silversmith RN

## 2016-07-30 NOTE — Discharge Summary (Signed)
Physician Discharge Summary  Malik Zuniga ZHG:[id number] DOB: Jun 26, 1945 DOA: 07/28/2016  PCP: [name], MD  Admit date: 07/28/2016 Discharge date: 07/30/2016  Admitted From: Home Disposition:  Home  Recommendations for Outpatient Follow-up:  1. Follow up with PCP in 1-2 weeks 2. Follow up with Pulmonology Dr. [name] as an outpatient in 1-2 weeks 3. Please obtain CMP/CBC, Mag Phos in one week  Home Health: No Equipment/Devices: None  Discharge Condition: Stable CODE STATUS: Partial  Diet recommendation: Dysphagia 3 Diet with Honey Thick Fluid Consistency   Brief/Interim Summary: Malik Zuniga a 71 y.o.malewith medical history significant of COPD 2.5L oxygenat night  at baseline, recurrent aspiration pneumonia and resultant bronchiectasis, Lung Ca stage (treated in 2016), bipolar disorder and CVA, CAD, HTN, HLD, mild LV dysfunction with combined systolic and diastolic heart failure. He presented for confusion, lethargy, left sided chest pain and was found in the ER to be hypotensive with BP in 60s, sinus tachycardia, leukocytosis and left lower lobe opacity, an elevated procalcitonin and mild  AKI. He was admitted for treatment of recurrent pneumonia due to suspected aspiration. He was placed on broad spectrum Abx and improved significantly. PCCM was consulted and recommended continuing broad spectrum Abx and mucociliary clearance along with Mucinex. Patient improved and was deemed stable to D/C home and it was recommended to the patient to follow up with PCP and Pulmonary ars an outpatient in the next 1-2 weeks.   Discharge Diagnoses:  Principal Problem:   Aspiration pneumonia ([place]) Active Problems:   Bipolar disorder ([place])   Essential hypertension   CAD (coronary artery disease)   Primary cancer of right lower lobe of lung (HCC)   Bronchiectasis without acute exacerbation (HCC)   COPD (chronic obstructive pulmonary disease) (HCC)   Cerebrovascular disease,  arteriosclerotic, post-stroke   Paroxysmal atrial fibrillation (HCC)   Chronic anticoagulation   Hypotension   Severe sepsis (HCC)   Chronic combined systolic (congestive) and diastolic (congestive) heart failure (HCC)   Chest pain  Severe Sepsis from Aspiration PNA and Acute hypoxic respiratory failure, improved - Zosyn given in ER and the Hospital and changed to Augmentin  - stopped Vancomycin as Blood Cx was likely a Contaminant - weaned O2 as able and no longer on Day time O2; C/w Nighttime O2 - PCCM has seen him today and made recommendations to continue chest PT vest, Mucinex and chronic Prednisone at 10 mg daily -Transferred out of SDU 6/19 -Will ned to follow up with PCP and Pulmonary at D/C   Bronchiectasis without acute exacerbation -Due to chronic issues with aspiration -Stable -Follow up with PCP and Pulmonology as an outpatient   RV dysfunction -Noted on ECHO on 6/17  Bipolar disorder -Stable. Follow up with Outpatient Psychiatry   Hypotension with h/o Essential hypertension, improved - resumed Cardizem CD (at lower dose to prevent recurrence of A-fib) and Metoprolol at a lower dose -Resume Losartan and follow up with PCP   CAD (coronary artery disease) -Continue with Statin, pradaxa (not on aspirin)  Stage 1a adenocarcinoma in 2016  -Treated with SBRT -Follow up with Pulmonary as an outpatient   Paroxysmal Atrial Fibrillation -C/w Cardizem and Metoprolol at lower dose- if BP improves, can increase Cardizem and Metoprolol at increased doses but will defer to PCP to manage -C/w Pradaxa  Discharge Instructions  Discharge Instructions    Call MD for:  difficulty breathing, headache or visual disturbances    Complete by:  As directed    Call MD for:  extreme fatigue    Complete by:  As directed    Call MD for:  persistant dizziness or light-headedness    Complete by:  As directed    Call MD for:  persistant nausea and vomiting    Complete by:  As  directed    Call MD for:  severe uncontrolled pain    Complete by:  As directed    Call MD for:  temperature >100.4    Complete by:  As directed    Diet - low sodium heart healthy    Complete by:  As directed    Discharge instructions    Complete by:  As directed    Follow up with PCP and Pulmonologist as an outpatient. Take all medications as prescribed. If symptoms change or worsen please return to the ED for evaluation.   Increase activity slowly    Complete by:  As directed      Allergies as of 07/30/2016      Reactions   Lisinopril Swelling   ANGIOEDEMA   Lamictal [lamotrigine] Other (See Comments)   Weakness/difficulty swallowing   Ambien [zolpidem] Other (See Comments)   Unknown reaction      Medication List    STOP taking these medications   cyclobenzaprine 5 MG tablet Commonly known as:  FLEXERIL     TAKE these medications   amoxicillin-clavulanate 875-125 MG tablet Commonly known as:  AUGMENTIN Take 1 tablet by mouth every 12 (twelve) hours. Start taking on:  07/31/2016   ARIPiprazole 10 MG tablet Commonly known as:  ABILIFY Take 5 mg by mouth daily.   aspirin-acetaminophen-caffeine 250-250-65 MG tablet Commonly known as:  EXCEDRIN MIGRAINE Take 2 tablets by mouth every 6 (six) hours as needed for headache.   atorvastatin 20 MG tablet Commonly known as:  LIPITOR Take 1 tablet (20 mg total) by mouth daily at 6 PM.   budesonide 0.25 MG/2ML nebulizer solution Commonly known as:  PULMICORT Take 4 mLs (0.5 mg total) by nebulization 2 (two) times daily.   cetirizine 10 MG tablet Commonly known as:  ZYRTEC Take 10 mg by mouth daily.   cholecalciferol 1000 units tablet Commonly known as:  VITAMIN D Take 1,000 Units by mouth every morning.   dabigatran 150 MG Caps capsule Commonly known as:  PRADAXA Take 1 capsule (150 mg total) by mouth every 12 (twelve) hours.   diazepam 5 MG tablet Commonly known as:  VALIUM Take 1 tablet (5 mg total) by mouth  every 8 (eight) hours as needed for anxiety or muscle spasms. scheduled   diltiazem 120 MG 24 hr capsule Commonly known as:  CARDIZEM CD Take 1 capsule (120 mg total) by mouth daily. Start taking on:  07/31/2016 What changed:  medication strength  how much to take   divalproex 500 MG 24 hr tablet Commonly known as:  DEPAKOTE ER Take 1,000 mg by mouth at bedtime.   docusate sodium 100 MG capsule Commonly known as:  COLACE Take 200 mg by mouth at bedtime.   DULoxetine 60 MG capsule Commonly known as:  CYMBALTA Take 120 mg by mouth daily.   feeding supplement (ENSURE ENLIVE) Liqd Take 237 mLs by mouth daily.   fluticasone 50 MCG/ACT nasal spray Commonly known as:  FLONASE Place 2 sprays into both nostrils daily as needed for allergies or rhinitis.   guaiFENesin 600 MG 12 hr tablet Commonly known as:  MUCINEX Take 1 tablet (600 mg total) by mouth 2 (two) times daily.   HYDROcodone-acetaminophen 10-325  MG tablet Commonly known as:  NORCO Take 1-2 tablets by mouth every 4 (four) hours as needed for pain.   ipratropium-albuterol 0.5-2.5 (3) MG/3ML Soln Commonly known as:  DUONEB Take 3 mLs by nebulization every 6 (six) hours.   losartan 50 MG tablet Commonly known as:  COZAAR Take 50 mg by mouth daily.   metoprolol tartrate 25 MG tablet Commonly known as:  LOPRESSOR Take 1 tablet (25 mg total) by mouth 2 (two) times daily. What changed:  medication strength  how much to take   montelukast 10 MG tablet Commonly known as:  SINGULAIR TAKE 1 TABLET AT BEDTIME   nitroGLYCERIN 0.4 MG SL tablet Commonly known as:  NITROSTAT PLACE 1 TABLET (0.4 MG TOTAL) UNDER THE TONGUE EVERY 5 (FIVE) MINUTES AS NEEDED FOR CHEST PAIN.   omeprazole 40 MG capsule Commonly known as:  PRILOSEC Take 40 mg by mouth 2 (two) times daily.   polyethylene glycol packet Commonly known as:  MIRALAX / GLYCOLAX Take 17 g by mouth 3 (three) times daily as needed for mild constipation.    predniSONE 10 MG tablet Commonly known as:  DELTASONE TAKE 1 TABLET (10 MG TOTAL) BY MOUTH DAILY WITH BREAKFAST.   primidone 250 MG tablet Commonly known as:  MYSOLINE TAKE 1 TABLET TWICE DAILY   promethazine 25 MG tablet Commonly known as:  PHENERGAN Take one (1) tablet by mouth every 6 to 8 hours as needed for nausea.   ranitidine 150 MG tablet Commonly known as:  ZANTAC Take 150-300 mg by mouth 2 (two) times daily as needed for heartburn.   RESOURCE THICKENUP CLEAR Powd Take 1 g by mouth as needed.   sodium chloride HYPERTONIC 3 % nebulizer solution Take by nebulization 2 (two) times daily.   tiZANidine 4 MG tablet Commonly known as:  ZANAFLEX TAKE 1 TABLET BY MOUTH EVERY 8 HOURS AS NEEDED FOR MUSCLE SPASMS.      Follow-up Information    [name], MD. Call in 1 week(s).   Specialty:  Family Medicine Why:  Call to schedule follow up within 1 week Contact information: [address] [place] [address] [phone]        [name], MD. Call in 1 week(s).   Specialty:  Pulmonary Disease Why:  Call to schedule follow up Appointment within 1 week for Aspiration Pneumonia Contact information: [place] [address] [phone]          Allergies  Allergen Reactions  . Lisinopril Swelling    ANGIOEDEMA  . Lamictal [Lamotrigine] Other (See Comments)    Weakness/difficulty swallowing  . Ambien [Zolpidem] Other (See Comments)    Unknown reaction   Consultations:  PCCM  Procedures/Studies: Dg Chest Port 1 View  Result Date: 07/28/2016 CLINICAL DATA:  71 year old male was unresponsive at 1400 hours today. Hypoxia. Shortness of breath and weakness. Prior aspiration pneumonia. EXAM: PORTABLE CHEST 1 VIEW COMPARISON:  Chest CT 04/16/2016, portable chest radiograph 01/21/2016, and earlier. FINDINGS: Portable AP semi upright view at 1631 hours. The patient is rotated toward the left similar to the prior portable film.  Confluent and reticulonodular opacity at the left lung base is new from the prior radiograph although likely superimposed on chronic lingula scarring and atelectasis. Superimposed platelike atelectasis in the left mid lung. No pneumothorax or pulmonary edema. No definite pleural effusion. Stable cardiac size and mediastinal contours. Chronic thoracolumbar fusion hardware. IMPRESSION: Acute airspace opacity at the left lung base. Consider aspiration pneumonia in  this clinical setting. No definite pleural effusion. Electronically Signed   By: Genevie Ann M.D.   On: 07/28/2016 17:31     Subjective: Seen and examined and was feeling tremendously better. No longer requiring daytime O2. Ready to go home and coughing up sputum.   Discharge Exam: Vitals:   07/29/16 2304 07/30/16 0545  BP: 138/70 (!) 148/72  Pulse: (!) 111 97  Resp:  18  Temp:  97.7 F (36.5 C)   Vitals:   07/29/16 2229 07/29/16 2304 07/30/16 0545 07/30/16 0906  BP:  138/70 (!) 148/72   Pulse: (!) 146 (!) 111 97   Resp:   18   Temp:   97.7 F (36.5 C)   TempSrc:   Oral   SpO2:   96% 96%  Weight:      Height:       General: Pt is alert, awake, not in acute distress; had some torticollis  Cardiovascular: RRR, S1/S2 +, no rubs, no gallops Respiratory: Diminished bilaterally with some wheezing. No appreciable crackles  Abdominal: Soft, NT, ND, bowel sounds + Extremities: no edema, no cyanosis  The results of significant diagnostics from this hospitalization (including imaging, microbiology, ancillary and laboratory) are listed below for reference.    Microbiology: Recent Results (from the past 240 hour(s))  Blood Culture (routine x 2)     Status: None (Preliminary result)   Collection Time: 07/28/16  4:30 PM  Result Value Ref Range Status   Specimen Description BLOOD RIGHT ANTECUBITAL  Final   Special Requests   Final    BOTTLES DRAWN AEROBIC AND ANAEROBIC Blood Culture adequate volume   Culture  Setup Time   Final    GRAM  POSITIVE COCCI IN CLUSTERS AEROBIC BOTTLE ONLY CRITICAL RESULT CALLED TO, READ BACK BY AND VERIFIED WITH: Sheffield Slider [facility] 2035 07/29/16 A BROWNING Performed at [facility], [place] [address] [place], [place] [address]    Culture GRAM POSITIVE COCCI  Final   Report Status PENDING  Incomplete  Blood Culture (routine x 2)     Status: None (Preliminary result)   Collection Time: 07/28/16  4:30 PM  Result Value Ref Range Status   Specimen Description BLOOD BLOOD LEFT FOREARM  Final   Special Requests IN PEDIATRIC BOTTLE Blood Culture adequate volume  Final   Culture   Final    NO GROWTH < 24 HOURS Performed at [facility], [place] [address] [place], [place] [address]    Report Status PENDING  Incomplete  Blood Culture ID Panel (Reflexed)     Status: Abnormal   Collection Time: 07/28/16  4:30 PM  Result Value Ref Range Status   Enterococcus species NOT DETECTED NOT DETECTED Final   Listeria monocytogenes NOT DETECTED NOT DETECTED Final   Staphylococcus species DETECTED (A) NOT DETECTED Final    Comment: Methicillin (oxacillin) susceptible coagulase negative staphylococcus. Possible blood culture contaminant (unless isolated from more than one blood culture draw or clinical case suggests pathogenicity). No antibiotic treatment is indicated for blood  culture contaminants. CRITICAL RESULT CALLED TO, READ BACK BY AND VERIFIED WITH: Sheffield Slider [facility] 2035 07/29/16 A BROWNING    Staphylococcus aureus NOT DETECTED NOT DETECTED Final   Methicillin resistance NOT DETECTED NOT DETECTED Final   Streptococcus species NOT DETECTED NOT DETECTED Final   Streptococcus agalactiae NOT DETECTED NOT DETECTED Final   Streptococcus pneumoniae NOT DETECTED NOT DETECTED Final   Streptococcus pyogenes NOT DETECTED NOT DETECTED Final   Acinetobacter baumannii NOT DETECTED  NOT DETECTED Final   Enterobacteriaceae species NOT DETECTED NOT DETECTED Final   Enterobacter cloacae complex NOT DETECTED NOT  DETECTED Final   Escherichia coli NOT DETECTED NOT DETECTED Final   Klebsiella oxytoca NOT DETECTED NOT DETECTED Final   Klebsiella pneumoniae NOT DETECTED NOT DETECTED Final   Proteus species NOT DETECTED NOT DETECTED Final   Serratia marcescens NOT DETECTED NOT DETECTED Final   Haemophilus influenzae NOT DETECTED NOT DETECTED Final   Neisseria meningitidis NOT DETECTED NOT DETECTED Final   Pseudomonas aeruginosa NOT DETECTED NOT DETECTED Final   Candida albicans NOT DETECTED NOT DETECTED Final   Candida glabrata NOT DETECTED NOT DETECTED Final   Candida krusei NOT DETECTED NOT DETECTED Final   Candida parapsilosis NOT DETECTED NOT DETECTED Final   Candida tropicalis NOT DETECTED NOT DETECTED Final    Comment: Performed at [facility], [place] [address] [place], [place] [address]  MRSA PCR Screening     Status: None   Collection Time: 07/28/16  9:27 PM  Result Value Ref Range Status   MRSA by PCR NEGATIVE NEGATIVE Final    Comment:        The GeneXpert MRSA Assay (FDA approved for NASAL specimens only), is one component of a comprehensive MRSA colonization surveillance program. It is not intended to diagnose MRSA infection nor to guide or monitor treatment for MRSA infections.     Labs: BNP (last 3 results)  Recent Labs  12/18/15 1149 01/21/16 1601  BNP 38.6 63.8   Basic Metabolic Panel:  Recent Labs Lab 07/28/16 1629 07/29/16 0329 07/30/16 0645  NA 139 138 140  K 4.9 4.1 3.1*  CL 106 108 107  CO2 26 23 27   GLUCOSE 80 109* 87  BUN 28* 21* 11  CREATININE 1.67* 0.97 0.66  CALCIUM 7.6* 7.6* 8.4*  MG  --  1.4* 2.0  PHOS  --  3.2  --    Liver Function Tests:  Recent Labs Lab 07/28/16 1629 07/29/16 0329  AST 40 28  ALT 39 35  ALKPHOS 37* 36*  BILITOT 0.5 0.4  PROT 5.2* 5.3*  ALBUMIN 2.8* 2.6*   No results for input(s): LIPASE, AMYLASE in the last 168 hours. No results for input(s): AMMONIA in the last 168 hours. CBC:  Recent Labs Lab  07/28/16 1629 07/29/16 0329 07/30/16 0645  WBC 13.4* 14.7* 11.8*  NEUTROABS 11.0*  --   --   HGB 10.9* 10.7* 10.7*  HCT 33.5* 32.3* 31.9*  MCV 96.0 96.4 92.2  PLT 190 169 175   Cardiac Enzymes:  Recent Labs Lab 07/28/16 2214 07/29/16 0329 07/29/16 0923  TROPONINI <0.03 <0.03 <0.03   BNP: Invalid input(s): POCBNP CBG: No results for input(s): GLUCAP in the last 168 hours. D-Dimer No results for input(s): DDIMER in the last 72 hours. Hgb A1c No results for input(s): HGBA1C in the last 72 hours. Lipid Profile No results for input(s): CHOL, HDL, LDLCALC, TRIG, CHOLHDL, LDLDIRECT in the last 72 hours. Thyroid function studies  Recent Labs  07/29/16 0329  TSH 0.202*   Anemia work up No results for input(s): VITAMINB12, FOLATE, FERRITIN, TIBC, IRON, RETICCTPCT in the last 72 hours. Urinalysis    Component Value Date/Time   COLORURINE YELLOW 07/28/2016 1630   APPEARANCEUR CLEAR 07/28/2016 1630   LABSPEC 1.019 07/28/2016 1630   PHURINE 5.0 07/28/2016 1630   GLUCOSEU NEGATIVE 07/28/2016 1630   HGBUR NEGATIVE 07/28/2016 1630   BILIRUBINUR NEGATIVE 07/28/2016 [place] NEGATIVE 07/28/2016 1630  PROTEINUR NEGATIVE 07/28/2016 1630   UROBILINOGEN 0.2 10/23/2013 1127   NITRITE NEGATIVE 07/28/2016 1630   LEUKOCYTESUR NEGATIVE 07/28/2016 1630   Sepsis Labs Invalid input(s): PROCALCITONIN,  WBC,  LACTICIDVEN Microbiology Recent Results (from the past 240 hour(s))  Blood Culture (routine x 2)     Status: None (Preliminary result)   Collection Time: 07/28/16  4:30 PM  Result Value Ref Range Status   Specimen Description BLOOD RIGHT ANTECUBITAL  Final   Special Requests   Final    BOTTLES DRAWN AEROBIC AND ANAEROBIC Blood Culture adequate volume   Culture  Setup Time   Final    GRAM POSITIVE COCCI IN CLUSTERS AEROBIC BOTTLE ONLY CRITICAL RESULT CALLED TO, READ BACK BY AND VERIFIED WITH: Sheffield Slider [facility] 2035 07/29/16 A BROWNING Performed at [facility], [place] [address], [place], [place] [address]    Culture GRAM POSITIVE COCCI  Final   Report Status PENDING  Incomplete  Blood Culture (routine x 2)     Status: None (Preliminary result)   Collection Time: 07/28/16  4:30 PM  Result Value Ref Range Status   Specimen Description BLOOD BLOOD LEFT FOREARM  Final   Special Requests IN PEDIATRIC BOTTLE Blood Culture adequate volume  Final   Culture   Final    NO GROWTH < 24 HOURS Performed at [facility], [place] [address], [place], [place] [address]    Report Status PENDING  Incomplete  Blood Culture ID Panel (Reflexed)     Status: Abnormal   Collection Time: 07/28/16  4:30 PM  Result Value Ref Range Status   Enterococcus species NOT DETECTED NOT DETECTED Final   Listeria monocytogenes NOT DETECTED NOT DETECTED Final   Staphylococcus species DETECTED (A) NOT DETECTED Final    Comment: Methicillin (oxacillin) susceptible coagulase negative staphylococcus. Possible blood culture contaminant (unless isolated from more than one blood culture draw or clinical case suggests pathogenicity). No antibiotic treatment is indicated for blood  culture contaminants. CRITICAL RESULT CALLED TO, READ BACK BY AND VERIFIED WITH: Sheffield Slider [facility] 2035 07/29/16 A BROWNING    Staphylococcus aureus NOT DETECTED NOT DETECTED Final   Methicillin resistance NOT DETECTED NOT DETECTED Final   Streptococcus species NOT DETECTED NOT DETECTED Final   Streptococcus agalactiae NOT DETECTED NOT DETECTED Final   Streptococcus pneumoniae NOT DETECTED NOT DETECTED Final   Streptococcus pyogenes NOT DETECTED NOT DETECTED Final   Acinetobacter baumannii NOT DETECTED NOT DETECTED Final   Enterobacteriaceae species NOT DETECTED NOT DETECTED Final   Enterobacter cloacae complex NOT DETECTED NOT DETECTED Final   Escherichia coli NOT DETECTED NOT DETECTED Final   Klebsiella oxytoca NOT DETECTED NOT DETECTED Final   Klebsiella pneumoniae NOT DETECTED NOT DETECTED Final    Proteus species NOT DETECTED NOT DETECTED Final   Serratia marcescens NOT DETECTED NOT DETECTED Final   Haemophilus influenzae NOT DETECTED NOT DETECTED Final   Neisseria meningitidis NOT DETECTED NOT DETECTED Final   Pseudomonas aeruginosa NOT DETECTED NOT DETECTED Final   Candida albicans NOT DETECTED NOT DETECTED Final   Candida glabrata NOT DETECTED NOT DETECTED Final   Candida krusei NOT DETECTED NOT DETECTED Final   Candida parapsilosis NOT DETECTED NOT DETECTED Final   Candida tropicalis NOT DETECTED NOT DETECTED Final    Comment: Performed at [facility], [address], [place], [place] [address]  MRSA PCR Screening     Status: None   Collection Time: 07/28/16  9:27 PM  Result Value Ref Range Status  MRSA by PCR NEGATIVE NEGATIVE Final    Comment:        The GeneXpert MRSA Assay (FDA approved for NASAL specimens only), is one component of a comprehensive MRSA colonization surveillance program. It is not intended to diagnose MRSA infection nor to guide or monitor treatment for MRSA infections.    Time coordinating discharge: 35 minutes  SIGNED:  Kerney Elbe, DO [facility] 07/30/2016, 1:07 PM Pager [phone]  If 7PM-7AM, please contact night-coverage [email or web address] Password [email or web address]

## 2016-07-30 NOTE — Progress Notes (Signed)
Physical Therapy Treatment Patient Details Name: Malik Zuniga MRN: [id number] DOB: 1945-04-18 Today's Date: 07/30/2016    History of Present Illness Malik Zuniga is a 71 y.o. 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  Ambulated ~150 using RW with CGA.  Exhibits DOE but pt states this is normal for him. Pt eager to d/c home.     Follow Up Recommendations  No PT follow up     Equipment Recommendations  None recommended by PT    Recommendations for Other Services       Precautions / Restrictions Precautions Precautions: Fall Restrictions Weight Bearing Restrictions: No    Mobility  Bed Mobility Overal bed mobility: Modified Independent Bed Mobility: Supine to Sit;Sit to Supine     Supine to sit: HOB elevated        Transfers Overall transfer level: Needs assistance Equipment used: Rolling walker (2 wheeled) Transfers: Sit to/from Stand Sit to Stand: Supervision         General transfer comment: cues for hand placement & safe body positioning before sitting.   Ambulation/Gait Ambulation/Gait assistance: Min guard Ambulation Distance (Feet): 150 Feet Assistive device: Rolling walker (2 wheeled) Gait Pattern/deviations: Trunk flexed     General Gait Details: cues to stay closer to RW & posture.     Stairs            Wheelchair Mobility    Modified Rankin (Stroke Patients Only)       Balance                                            Cognition Arousal/Alertness: Awake/alert Behavior During Therapy: WFL for tasks assessed/performed Overall Cognitive Status: Within Functional Limits for tasks assessed                                         Exercises      General Comments        Pertinent Vitals/Pain Pain Assessment: No/denies pain    Home Living                      Prior Function            PT Goals (current goals can now be found in the care plan section) Acute Rehab PT Goals PT Goal Formulation: With patient/family Time For Goal Achievement: 07/29/16 Potential to Achieve Goals: Good    Frequency    Min 3X/week      PT Plan Current plan remains appropriate    Co-evaluation              AM-PAC PT "6 Clicks" Daily Activity  Outcome Measure                   End of Session Equipment Utilized During Treatment: Gait belt Activity Tolerance: Patient tolerated treatment well Patient left: in bed;with call bell/phone within reach Nurse Communication: Mobility status       Time: 0251-0305 PT Time Calculation (min) (ACUTE ONLY):  14 min  Charges:  $Gait Training: 8-22 mins                    G CodesSarajane Marek, [place] [phone] 07/30/2016    Sena Hitch 07/30/2016, 3:25 PM

## 2016-07-30 NOTE — Progress Notes (Signed)
Pt refused to do chest vest therapy. PT being D/C home.

## 2016-07-31 ENCOUNTER — Telehealth: Payer: Self-pay

## 2016-07-31 LAB — CULTURE, BLOOD (ROUTINE X 2): SPECIAL REQUESTS: ADEQUATE

## 2016-07-31 NOTE — Telephone Encounter (Signed)
Spoke with patients wife, [name].   Transition Care Management Follow-up Telephone Call   Date discharged? 07/30/2016   How have you been since you were released from the hospital? "he's doing fairly"   Do you understand why you were in the hospital? yes, "aspiration pneumonia"   Do you understand the discharge instructions? yes   Where were you discharged to? Home. [place] nurse every 2 weeks.    Items Reviewed:  Medications reviewed: yes  Allergies reviewed: yes  Dietary changes reviewed: yes  Referrals reviewed: yes   Functional Questionnaire:   Activities of Daily Living (ADLs):   He states they are independent in the following: ambulation, bathing and hygiene, feeding, continence, grooming, toileting and dressing States they require assistance with the following: Wife remains in bathroom during shower.    Any transportation issues/concerns?: no   Any patient concerns? yes, Wife concerned about cardiac meds that were changed during hospitalization, plans to call cardiologist.    Confirmed importance and date/time of follow-up visits scheduled yes  Provider Appointment booked with PCP 08/07/2016 @ 11.   Confirmed with patient if condition begins to worsen call PCP or go to the ER.  Patient was given the office number and encouraged to call back with question or concerns.  : yes

## 2016-08-02 LAB — CULTURE, BLOOD (ROUTINE X 2)
Culture: NO GROWTH
SPECIAL REQUESTS: ADEQUATE

## 2016-08-04 ENCOUNTER — Encounter: Payer: Self-pay | Admitting: Pulmonary Disease

## 2016-08-04 ENCOUNTER — Ambulatory Visit (INDEPENDENT_AMBULATORY_CARE_PROVIDER_SITE_OTHER): Payer: Medicare Other | Admitting: Pulmonary Disease

## 2016-08-04 VITALS — BP 110/60 | HR 112 | Ht 70.0 in | Wt 164.0 lb

## 2016-08-04 DIAGNOSIS — I25118 Atherosclerotic heart disease of native coronary artery with other forms of angina pectoris: Secondary | ICD-10-CM | POA: Diagnosis not present

## 2016-08-04 DIAGNOSIS — J449 Chronic obstructive pulmonary disease, unspecified: Secondary | ICD-10-CM

## 2016-08-04 DIAGNOSIS — J9611 Chronic respiratory failure with hypoxia: Secondary | ICD-10-CM

## 2016-08-04 DIAGNOSIS — R042 Hemoptysis: Secondary | ICD-10-CM

## 2016-08-04 DIAGNOSIS — J471 Bronchiectasis with (acute) exacerbation: Secondary | ICD-10-CM | POA: Diagnosis not present

## 2016-08-04 NOTE — Progress Notes (Signed)
Cardiology Office Note:    Date:  08/05/2016   ID:  Malik Zuniga, DOB 12/13/1945, MRN [id number]  PCP:  [name], MD  Cardiologist:  Dr. Daneen Schick  Referring MD: [name], MD   Chief Complaint  Patient presents with  . Hospitalization Follow-up    pneumonia    History of Present Illness:    Malik Zuniga is a 71 y.o. 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   The patient was recently admitted to the hospital on 07/28/16 and found to be hypotensive with blood pressure in the 60s, tachycardic with leukocytosis and left lower lobe opacity. He also had mild acute kidney injury. He was admitted for treatment of recurrent pneumonia due to suspected aspiration and treated with broad-spectrum antibiotics.   The patient is feeling better with improved breathing since hospital discharge. He did have one episode of left arm pain that radiated to his central chest about a week ago shortly after discharge that lasted for several minutes and resolved with nitroglycerin. He has had no recurrence of this. His admit to occasional heart flutters lasting for less than 1 minute and occurring several times per day. He has no orthopnea or PND. He has occasional light headedness when he is very relaxed. The patient is concerned that his diltiazem and metoprolol were decreased at the hospital.  The patient had a low risk nuclear stress test in 07/2015. Echocardiogram in 08/06/2015 showed LVEF 50-55%, normal wall motion, left ventricular diastolic function parameters were normal, right ventricle appears dilated with probable moderate systolic dysfunction (this was a limited quality study)  Past Medical History:  Diagnosis Date  .  Anemia associated with acute blood loss   . Angioedema 10/31/2010  . Anxiety    takes Valium daily  . Aspiration pneumonia ([place]) 2010  . Asthma   . Bacteremia due to Pseudomonas 10/04/2011   D/t gram neg rods   . Bipolar affective disorder ([place])   . 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 Chronic back pain    compression fracture  . Constipation    takes Colace daily as well as Miralax  . Coronary artery disease   . Depression    takes Cymbalta daily  . Emphysema of lung (HCC)    Albuterol as needed;Symbicort daily and Singulair at bedtime  . GERD (gastroesophageal reflux disease)    takes Omeprazole daily  . Headache, chronic daily   . History of bronchitis   . History of colon polyps   . History of migraine    last migraine a couple of days ago;takes Excedrin Migraine  . History of prostate cancer 2004  . Hyperlipidemia    takes Simvastatin daily  . Hypertension    takes Metoprolol daily as well as Hyzaar  . Insomnia    takes Benadryl nightly  . Lung cancer ([place]) 2016  . Mood change ([place])    after Brain surgery mood changed and was placed on Depakote  . Myocardial infarction ([place]) 04/1998  . Neuromuscular disorder ([place]) 1998   right carpal tunnel release  . On home O2   . Shortness of breath    with exertion  . Stroke [facility]) 1998  Brain Aneurysm  . Tremor     Past Surgical History:  Procedure Laterality Date  . BACK SURGERY  08/2004; 02/2005; 04/2006; 06/2007; 7/20101   all by Dr. Annette Stable  . BACK SURGERY  05/2013  . BRAIN SURGERY  1999   clip aneurysm  . [place]   right  . CHOLECYSTECTOMY  1996  . COLONOSCOPY    . CORONARY ANGIOPLASTY WITH STENT PLACEMENT  2000   RCA stent, Dr. Rollene Fare; denies stent for PAD 06/15/13  . CORONARY ANGIOPLASTY WITH STENT PLACEMENT  04/1998  . CRANIOTOMY  1999   to clip aneurism, Dr. Annette Stable  . DG BIOPSY LUNG    . ESOPHAGOGASTRODUODENOSCOPY N/A 07/23/2012   Procedure:  ESOPHAGOGASTRODUODENOSCOPY (EGD);  Surgeon: Jeryl Columbia, MD;  Location: [facility] ENDOSCOPY;  Service: Endoscopy;  Laterality: N/A;  buccini /ja  . GASTROSTOMY W/ FEEDING TUBE  2008   Dr. Watt Climes; only had for a few months  . HAND SURGERY  1989   crushed thumb, Dr. Fredna Dow  . PROSTATECTOMY  04/2001   removal of prostate cancer, Dr. Janice Norrie  . SPINE SURGERY    . [place]   left arm, Dr. Fredna Dow    Current Medications: Current Meds  Medication Sig  . amoxicillin-clavulanate (AUGMENTIN) 875-125 MG tablet Take 1 tablet by mouth every 12 (twelve) hours.  . ARIPiprazole (ABILIFY) 10 MG tablet Take 5 mg by mouth daily.   Marland Kitchen aspirin-acetaminophen-caffeine (EXCEDRIN MIGRAINE) 250-250-65 MG tablet Take 2 tablets by mouth every 6 (six) hours as needed for headache.  Marland Kitchen atorvastatin (LIPITOR) 20 MG tablet Take 1 tablet (20 mg total) by mouth daily at 6 PM.  . budesonide (PULMICORT) 0.25 MG/2ML nebulizer solution Take 4 mLs (0.5 mg total) by nebulization 2 (two) times daily.  . cetirizine (ZYRTEC) 10 MG tablet Take 10 mg by mouth daily.  . cholecalciferol (VITAMIN D) 1000 UNITS tablet Take 1,000 Units by mouth every morning.   . dabigatran (PRADAXA) 150 MG CAPS capsule Take 1 capsule (150 mg total) by mouth every 12 (twelve) hours.  . diazepam (VALIUM) 5 MG tablet Take 1 tablet (5 mg total) by mouth every 8 (eight) hours as needed for anxiety or muscle spasms. scheduled  . diltiazem (CARDIZEM CD) 120 MG 24 hr capsule Take 1 capsule (120 mg total) by mouth daily.  . divalproex (DEPAKOTE) 500 MG 24 hr tablet Take 1,000 mg by mouth at bedtime.   . docusate sodium (COLACE) 100 MG capsule Take 200 mg by mouth at bedtime.   . DULoxetine (CYMBALTA) 60 MG capsule Take 120 mg by mouth daily.   . feeding supplement, ENSURE ENLIVE, (ENSURE ENLIVE) LIQD Take 237 mLs by mouth daily.  . fluticasone (FLONASE) 50 MCG/ACT nasal spray Place 2 sprays into both nostrils daily as needed for allergies or rhinitis.  Marland Kitchen  guaiFENesin (MUCINEX) 600 MG 12 hr tablet Take 1 tablet (600 mg total) by mouth 2 (two) times daily.  Marland Kitchen HYDROcodone-acetaminophen (NORCO) 10-325 MG tablet Take 1-2 tablets by mouth every 4 (four) hours as needed for pain.  Marland Kitchen ipratropium-albuterol (DUONEB) 0.5-2.5 (3) MG/3ML SOLN Take 3 mLs by nebulization every 6 (six) hours.  Marland Kitchen losartan (COZAAR) 50 MG tablet Take 50 mg by mouth daily.  . Maltodextrin-Xanthan Gum (RESOURCE THICKENUP CLEAR) POWD Take 1 g by mouth as needed.  . metoprolol tartrate (LOPRESSOR) 25 MG tablet Take 1 tablet (25 mg total) by mouth 2 (two) times daily.  . montelukast (SINGULAIR) 10 MG tablet  TAKE 1 TABLET AT BEDTIME  . nitroGLYCERIN (NITROSTAT) 0.4 MG SL tablet PLACE 1 TABLET (0.4 MG TOTAL) UNDER THE TONGUE EVERY 5 (FIVE) MINUTES AS NEEDED FOR CHEST PAIN.  Marland Kitchen omeprazole (PRILOSEC) 40 MG capsule Take 40 mg by mouth 2 (two) times daily.   . OXYGEN Inhale 2.5 L into the lungs at bedtime.  . polyethylene glycol (MIRALAX / GLYCOLAX) packet Take 17 g by mouth 3 (three) times daily as needed for mild constipation.  . predniSONE (DELTASONE) 10 MG tablet TAKE 1 TABLET (10 MG TOTAL) BY MOUTH DAILY WITH BREAKFAST.  Marland Kitchen primidone (MYSOLINE) 250 MG tablet TAKE 1 TABLET TWICE DAILY  . promethazine (PHENERGAN) 25 MG tablet Take one (1) tablet by mouth every 6 to 8 hours as needed for nausea.  . ranitidine (ZANTAC) 150 MG tablet Take 150-300 mg by mouth 2 (two) times daily as needed for heartburn.  . sodium chloride HYPERTONIC 3 % nebulizer solution Take by nebulization 2 (two) times daily.  Marland Kitchen tiZANidine (ZANAFLEX) 4 MG tablet TAKE 1 TABLET BY MOUTH EVERY 8 HOURS AS NEEDED FOR MUSCLE SPASMS.     Allergies:   Lisinopril; Lamictal [lamotrigine]; and Ambien [zolpidem]   Social History   Social History  . Marital status: Married    Spouse name: N/A  . Number of children: N/A  . Years of education: N/A   Social History Main Topics  . Smoking status: Former Smoker    Packs/day: 1.50     Years: 52.00    Types: Cigarettes    Quit date: 07/17/2012  . Smokeless tobacco: Never Used     Comment: Former smoker  . Alcohol use No  . Drug use: No  . Sexual activity: No   Other Topics Concern  . Not on file   Social History Narrative  . No narrative on file     Family History: The patient's family history includes Cancer in his father; Heart disease in his mother. ROS:   Please see the history of present illness.     All other systems reviewed and are negative.  EKGs/Labs/Other Studies Reviewed:    The following studies were reviewed today:  Nuclear stress test 07/13/2015 Study Highlights    Nuclear stress EF: 48%.  There was no ST segment deviation noted during stress.  There is a medium defect of moderate severity present in the basal inferolateral, mid inferolateral and apical lateral location. The defect is non-reversible. This is most consistent with diaphragmatic attenuation artifact. No ischemia noted.  There is a small defect of moderate severity present in the apex location. The defect is non-reversible. This is most consistent with diaphragmatic attenuation artifact. No ischemia noted.  There is a small defect of moderate severity present in the apical anterior and apical septal location. The defect is non-reversible. This is most consistent with diaphragmatic attenuation artifact. No ischemia noted.  The left ventricular ejection fraction is mildly decreased (45-54%).  This is a low risk study.       EKG:  EKG is  ordered today.  The ekg ordered today demonstrates Normal sinus rhythm at a rate of 89 bpm with QTC of 428, no acute changes  Recent Labs: 01/21/2016: B Natriuretic Peptide 55.8 07/29/2016: ALT 35; TSH 0.202 07/30/2016: BUN 11; Creatinine, Ser 0.66; Hemoglobin 10.7; Magnesium 2.0; Platelets 175; Potassium 3.1; Sodium 140   Recent Lipid Panel    Component Value Date/Time   CHOL 249 (H) 06/05/2015 1119   TRIG 54.0 06/05/2015 1119   HDL  114.60 06/05/2015 1119   CHOLHDL 2 06/05/2015 1119   VLDL 10.8 06/05/2015 1119   LDLCALC 124 (H) 06/05/2015 1119   LDLDIRECT 112.3 04/22/2012 1127    Physical Exam:    VS:  BP 140/90 (BP Location: Right Arm, Patient Position: Sitting, Cuff Size: Normal)   Pulse 89   Ht 5\' 10"  (1.778 m)   BMI 23.53 kg/m     Wt Readings from Last 3 Encounters:  08/04/16 164 lb (74.4 kg)  07/29/16 182 lb 1.6 oz (82.6 kg)  05/22/16 165 lb (74.8 kg)     GEN:  Elderly, chronically ill-appearing, in a wheelchair, in no acute distress HEENT: Normal NECK: No JVD; No carotid bruits LYMPHATICS: No lymphadenopathy CARDIAC: RRR, no murmurs, rubs, gallops RESPIRATORY:  Clear to auscultation without rales, wheezing or rhonchi  ABDOMEN: Soft, non-tender, non-distended MUSCULOSKELETAL:  No edema; No deformity  SKIN: Warm and dry NEUROLOGIC:  Alert and oriented x 3 PSYCHIATRIC:  Normal affect   ASSESSMENT:    No diagnosis found. PLAN:    In order of problems listed above:  Paroxysmal atrial fibrillation -Due to hypotension Cardizem and metoprolol doses were lowered in hospital -Pt with brief fluttering occasionally. EKG shows NSR. -Blood pressure is now stable and will resume his prior Cardizem and metoprolol doses -Continue Pradaxa for stroke risk reduction  Hypertension -Patient was recently hospitalized for sepsis and aspiration pneumonia and was hypotensive -Blood pressure is currently back to normal, will resume his previous doses of Cardizem and metoprolol  CAD -Treatment with Pradaxa and statin, not on aspirin -Patient had solitary episode of chest discomfort shortly after discharge from the hospital and no recurrence. Will resume his Cardizem and metoprolol at his normal doses and patient is to report any further chest pain -low risk myoview 07/2015  Hyperlipidemia -LDL 124 on 06/05/15. Will have pt come in to check FLP.   Hypokalemia -K+ 3.1 on 6/20. Recheck BMet today.   S/P recent  HCAP due to possible aspiration -Discharged from hospital on 6/20 with continued improvement in breathing and is now back to his baseline. -His oxygen saturation was low in the office today. He was seen by pulmonologist yesterday and per his report oxygen saturation was also low at that time. Patient is using oxygen at at bedtime and states that he is using it more during the day recently. Advised patient to check his saturation level with his pulse ox and to use the oxygen when needed (less than 88%)  Medication Adjustments/Labs and Tests Ordered: Current medicines are reviewed at length with the patient today.  Concerns regarding medicines are outlined above. Labs and tests ordered and medication changes are outlined in the patient instructions below:  There are no Patient Instructions on file for this visit.   Signed, Daune Perch, NP  08/05/2016 8:26 AM    [facility]

## 2016-08-04 NOTE — Patient Instructions (Signed)
For your coughing up blood: We will get a CT scan of your chest in call you with the results That should clear up by next week, call me if it doesn't  For your bronchiectasis: Keep using the therapy vest twice a day Keep using the hypertonic saline twice a day  For your chronic respiratory failure with hypoxemia in the evenings: Keep using oxygen at night  For your physical deconditioning: Continue follow-up with neurology in exercises much as possible  I will see you back in 2 months or sooner if needed

## 2016-08-04 NOTE — Progress Notes (Signed)
Subjective:    Patient ID: Malik Zuniga, male    DOB: 10-Sep-1945, 71 y.o.   MRN: [id number]  Synopsis: former patient of Dr. 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   [name] 04/2009:  FEV1 1.65 (47%), FEV1% 49  CT chest 10/2013:  RLL infiltrates CXR 10/2013:  LLL infiltrate +h/o aspiration in the past.  +speech therapy evaluation with recs given.  Admitted multiple times in 2017 for recurrent aspiration pneumonia. Started therapy vest 2017  HPI  Chief Complaint  Patient presents with  . Follow-up    Pt was in the hospital Monday through Wednesday of last week for pneumonia and afib. Pt has a concern due to coughing up blood at least once a day, SOB very easily, and also wants to get his heart back in rhythm. States that he has an appt with cardiologist tomorrow, June 26.   [name] says that he was pretty sick last week but he's not sure what caused it.  He says that he never really felt anything coming on, but suddenly he felt weak and dyspneic and he had a drop in his blood pressure.  He said that when he was in the hospital he was struggling with atrial fibrillation.  He had been using his salt water nebulizers and therapy vest regularly.  He is still coughing up bright red blood however, typically 1-2 times a day when he has it, but some days he doesn't have it at all.  This is new since coming home.  He has some chest pain on the left.   He has been compliant with BIPAP at home prior to this.    Past Medical History:  Diagnosis Date  . Anemia associated with acute blood loss   . Angioedema 10/31/2010  . Anxiety    takes Valium daily  . Aspiration pneumonia ([place]) 2010  . Asthma   . Bacteremia due to Pseudomonas 10/04/2011   D/t gram neg rods   . Bipolar affective disorder ([place])   . 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 Chronic back pain    compression fracture  . Constipation    takes Colace daily as well as Miralax  . Coronary artery disease   . Depression    takes Cymbalta daily  . Emphysema of lung (HCC)    Albuterol as needed;Symbicort daily and Singulair at bedtime  . GERD (gastroesophageal reflux disease)    takes Omeprazole daily  . Headache, chronic daily   . History of bronchitis   . History of colon polyps   . History of migraine    last migraine a couple of days ago;takes Excedrin Migraine  . History of prostate cancer 2004  . Hyperlipidemia    takes Simvastatin daily  . Hypertension    takes Metoprolol daily as well as Hyzaar  . Insomnia    takes Benadryl nightly  . Lung cancer ([place]) 2016  . Mood change ([place])    after Brain surgery mood changed and was placed on Depakote  . Myocardial infarction ([place]) 04/1998  . Neuromuscular disorder ([place]) 1998   right carpal tunnel release  . On home O2   . Shortness of breath    with exertion  . Stroke [facility]) 1998   Brain Aneurysm  . Tremor  ROS: Gen: no Fever, chills, sweats HEENT: No sinus congestion, post nasal drip, headache PULM: No cough, dyspnea, chest tightness, wheezing  CV: No chest pain, no leg swelling, no palpitations       Objective:   Physical Exam Vitals:   08/04/16 1525  Weight: 164 lb (74.4 kg)  Height: 5\' 10"  (1.778 m)   RA  Gen: chronically ill appearing HENT: OP clear, TM's clear, neck supple PULM: Crackles bases B, normal percussion CV: RRR, no mgr, trace edema GI: BS+, soft, nontender Derm: no cyanosis or rash Psyche: normal mood and affect   Sleep study: 05/2016 No obstructive sleep apnea, but he had hypoxemia noted nightly.    June 2018 chest x-ray images independently reviewed showing a lingular infiltrate  March 2018 CT chest images independently reviewed showing what appears to be mucus in the left mainstem bronchus.   CBC    Component Value Date/Time   WBC 11.8 (H)  07/30/2016 0645   RBC 3.46 (L) 07/30/2016 0645   HGB 10.7 (L) 07/30/2016 0645   HCT 31.9 (L) 07/30/2016 0645   PLT 175 07/30/2016 0645   PLT 329 12/10/2009   MCV 92.2 07/30/2016 0645   MCH 30.9 07/30/2016 0645   MCHC 33.5 07/30/2016 0645   RDW 14.3 07/30/2016 0645   LYMPHSABS 1.5 07/28/2016 1629   MONOABS 0.9 07/28/2016 1629   EOSABS 0.0 07/28/2016 1629   BASOSABS 0.0 07/28/2016 1629    June 2018 hospitalization discharge summary records reviewed were he was admitted for acute on chronic respiratory failure with hypoxemia secondary to aspiration pneumonia and her bronchiectasis flareup.     Assessment & Plan:  Hemoptysis - Plan: CT Chest Wo Contrast  Bronchiectasis with acute exacerbation (HCC)  Chronic obstructive pulmonary disease, unspecified COPD type ([place])  Chronic respiratory failure with hypoxia (HCC)   Discussion: He had an exacerbation of his bronchiectasis last week. This is likely induced by another aspiration event as this is his typical pattern. I also think the poor air quality may have contributed. He has been compliant with his medications.  He notes the new problem of hemoptysis. This is likely related to his bronchiectasis which is currently an exacerbation but considering his history of lung cancer and the abnormal finding on the CT scan of his chest in March of "mucus" in his left mainstem bronchus I like to repeat his CT scan to make sure that has cleared up.  Plan: For your coughing up blood: We will get a CT scan of your chest in call you with the results That should clear up by next week, call me if it doesn't  For your bronchiectasis: Keep using the therapy vest twice a day Keep using the hypertonic saline twice a day  For your chronic respiratory failure with hypoxemia in the evenings: Keep using oxygen at night  For your physical deconditioning: Continue follow-up with neurology in exercises much as possible  I will see you back in 2 months  or sooner if needed  Current Outpatient Prescriptions:  .  amoxicillin-clavulanate (AUGMENTIN) 875-125 MG tablet, Take 1 tablet by mouth every 12 (twelve) hours., Disp: 16 tablet, Rfl: 0 .  ARIPiprazole (ABILIFY) 10 MG tablet, Take 5 mg by mouth daily. , Disp: , Rfl:  .  aspirin-acetaminophen-caffeine (EXCEDRIN MIGRAINE) 250-250-65 MG tablet, Take 2 tablets by mouth every 6 (six) hours as needed for headache., Disp: , Rfl:  .  atorvastatin (LIPITOR) 20 MG tablet, Take 1 tablet (20 mg total) by mouth daily at  6 PM., Disp: 90 tablet, Rfl: 1 .  budesonide (PULMICORT) 0.25 MG/2ML nebulizer solution, Take 4 mLs (0.5 mg total) by nebulization 2 (two) times daily., Disp: 60 mL, Rfl: 3 .  cetirizine (ZYRTEC) 10 MG tablet, Take 10 mg by mouth daily., Disp: , Rfl:  .  cholecalciferol (VITAMIN D) 1000 UNITS tablet, Take 1,000 Units by mouth every morning. , Disp: , Rfl:  .  dabigatran (PRADAXA) 150 MG CAPS capsule, Take 1 capsule (150 mg total) by mouth every 12 (twelve) hours., Disp: 180 capsule, Rfl: 3 .  diazepam (VALIUM) 5 MG tablet, Take 1 tablet (5 mg total) by mouth every 8 (eight) hours as needed for anxiety or muscle spasms. scheduled, Disp: 30 tablet, Rfl: 0 .  diltiazem (CARDIZEM CD) 120 MG 24 hr capsule, Take 1 capsule (120 mg total) by mouth daily., Disp: 30 capsule, Rfl: 0 .  divalproex (DEPAKOTE) 500 MG 24 hr tablet, Take 1,000 mg by mouth at bedtime. , Disp: , Rfl:  .  docusate sodium (COLACE) 100 MG capsule, Take 200 mg by mouth at bedtime. , Disp: , Rfl:  .  DULoxetine (CYMBALTA) 60 MG capsule, Take 120 mg by mouth daily. , Disp: , Rfl:  .  feeding supplement, ENSURE ENLIVE, (ENSURE ENLIVE) LIQD, Take 237 mLs by mouth daily., Disp: , Rfl:  .  fluticasone (FLONASE) 50 MCG/ACT nasal spray, Place 2 sprays into both nostrils daily as needed for allergies or rhinitis., Disp: , Rfl:  .  guaiFENesin (MUCINEX) 600 MG 12 hr tablet, Take 1 tablet (600 mg total) by mouth 2 (two) times daily., Disp: 20  tablet, Rfl: 0 .  HYDROcodone-acetaminophen (NORCO) 10-325 MG tablet, Take 1-2 tablets by mouth every 4 (four) hours as needed for pain., Disp: , Rfl: 0 .  ipratropium-albuterol (DUONEB) 0.5-2.5 (3) MG/3ML SOLN, Take 3 mLs by nebulization every 6 (six) hours., Disp: 360 mL, Rfl: 3 .  losartan (COZAAR) 50 MG tablet, Take 50 mg by mouth daily., Disp: , Rfl:  .  Maltodextrin-Xanthan Gum (RESOURCE THICKENUP CLEAR) POWD, Take 1 g by mouth as needed., Disp: 10 Can, Rfl: 3 .  metoprolol tartrate (LOPRESSOR) 25 MG tablet, Take 1 tablet (25 mg total) by mouth 2 (two) times daily., Disp: 60 tablet, Rfl: 0 .  montelukast (SINGULAIR) 10 MG tablet, TAKE 1 TABLET AT BEDTIME, Disp: 90 tablet, Rfl: 1 .  nitroGLYCERIN (NITROSTAT) 0.4 MG SL tablet, PLACE 1 TABLET (0.4 MG TOTAL) UNDER THE TONGUE EVERY 5 (FIVE) MINUTES AS NEEDED FOR CHEST PAIN., Disp: 25 tablet, Rfl: 2 .  omeprazole (PRILOSEC) 40 MG capsule, Take 40 mg by mouth 2 (two) times daily. , Disp: , Rfl:  .  polyethylene glycol (MIRALAX / GLYCOLAX) packet, Take 17 g by mouth 3 (three) times daily as needed for mild constipation., Disp: , Rfl:  .  predniSONE (DELTASONE) 10 MG tablet, TAKE 1 TABLET (10 MG TOTAL) BY MOUTH DAILY WITH BREAKFAST., Disp: 30 tablet, Rfl: 2 .  primidone (MYSOLINE) 250 MG tablet, TAKE 1 TABLET TWICE DAILY, Disp: 180 tablet, Rfl: 1 .  promethazine (PHENERGAN) 25 MG tablet, Take one (1) tablet by mouth every 6 to 8 hours as needed for nausea., Disp: , Rfl: 6 .  ranitidine (ZANTAC) 150 MG tablet, Take 150-300 mg by mouth 2 (two) times daily as needed for heartburn., Disp: , Rfl:  .  sodium chloride HYPERTONIC 3 % nebulizer solution, Take by nebulization 2 (two) times daily., Disp: 750 mL, Rfl: 12 .  tiZANidine (ZANAFLEX) 4 MG  tablet, TAKE 1 TABLET BY MOUTH EVERY 8 HOURS AS NEEDED FOR MUSCLE SPASMS., Disp: 90 tablet, Rfl: 0

## 2016-08-05 ENCOUNTER — Ambulatory Visit (INDEPENDENT_AMBULATORY_CARE_PROVIDER_SITE_OTHER): Payer: Medicare Other | Admitting: Cardiology

## 2016-08-05 ENCOUNTER — Encounter: Payer: Self-pay | Admitting: Cardiology

## 2016-08-05 VITALS — BP 140/90 | HR 89 | Ht 70.0 in

## 2016-08-05 DIAGNOSIS — E782 Mixed hyperlipidemia: Secondary | ICD-10-CM | POA: Diagnosis not present

## 2016-08-05 DIAGNOSIS — I209 Angina pectoris, unspecified: Secondary | ICD-10-CM

## 2016-08-05 DIAGNOSIS — I25118 Atherosclerotic heart disease of native coronary artery with other forms of angina pectoris: Secondary | ICD-10-CM

## 2016-08-05 DIAGNOSIS — I480 Paroxysmal atrial fibrillation: Secondary | ICD-10-CM | POA: Diagnosis not present

## 2016-08-05 DIAGNOSIS — I10 Essential (primary) hypertension: Secondary | ICD-10-CM

## 2016-08-05 LAB — BASIC METABOLIC PANEL
BUN / CREAT RATIO: 20 (ref 10–24)
BUN: 15 mg/dL (ref 8–27)
CHLORIDE: 98 mmol/L (ref 96–106)
CO2: 24 mmol/L (ref 20–29)
CREATININE: 0.74 mg/dL — AB (ref 0.76–1.27)
Calcium: 9.5 mg/dL (ref 8.6–10.2)
GFR calc Af Amer: 107 mL/min/{1.73_m2} (ref >59)
GFR calc non Af Amer: 93 mL/min/{1.73_m2} (ref >59)
GLUCOSE: 95 mg/dL (ref 65–99)
Potassium: 5.5 mmol/L — ABNORMAL HIGH (ref 3.5–5.2)
SODIUM: 137 mmol/L (ref 134–144)

## 2016-08-05 MED ORDER — METOPROLOL TARTRATE 50 MG PO TABS: 50.0000 mg | ORAL_TABLET | Freq: Two times a day (BID) | ORAL | 3 refills | Status: AC

## 2016-08-05 MED ORDER — NITROGLYCERIN 0.4 MG SL SUBL: SUBLINGUAL_TABLET | SUBLINGUAL | 2 refills | Status: AC

## 2016-08-05 MED ORDER — DILTIAZEM HCL ER COATED BEADS 240 MG PO CP24: 240.0000 mg | ORAL_CAPSULE | Freq: Every day | ORAL | 3 refills | Status: AC

## 2016-08-05 NOTE — Patient Instructions (Signed)
Medication Instructions:  Your physician has recommended you make the following change in your medication:  1. Increase Cardizem (240 ) daily 2. Increase Toprol (50 mg ) twice daily.  3. Refilled nitro, all these medications have been sent in today to patient's requested pharmacy.   Labwork: Your physician recommends that you have lab work today: bmet   Testing/Procedures: -None  Follow-Up: Your physician recommends that you keep your scheduled  follow-up appointment with Dr. Tamala Julian.   Any Other Special Instructions Will Be Listed Below (If Applicable).   If you continue to have chest pain need to call our office at [phone].  If you need a refill on your cardiac medications before your next appointment, please call your pharmacy.

## 2016-08-07 ENCOUNTER — Ambulatory Visit: Payer: Medicare Other | Admitting: Family Medicine

## 2016-08-08 ENCOUNTER — Emergency Department (HOSPITAL_COMMUNITY)
Admission: EM | Admit: 2016-08-08 | Discharge: 2016-08-08 | Discharge status: Absent without leave | Payer: Medicare Other

## 2016-08-08 ENCOUNTER — Telehealth: Payer: Self-pay | Admitting: Pulmonary Disease

## 2016-08-08 ENCOUNTER — Other Ambulatory Visit: Payer: Self-pay | Admitting: Student

## 2016-08-08 ENCOUNTER — Telehealth: Payer: Self-pay | Admitting: Cardiology

## 2016-08-08 DIAGNOSIS — E875 Hyperkalemia: Secondary | ICD-10-CM

## 2016-08-08 DIAGNOSIS — J479 Bronchiectasis, uncomplicated: Secondary | ICD-10-CM

## 2016-08-08 LAB — POTASSIUM: POTASSIUM: 5 mmol/L (ref 3.5–5.1)

## 2016-08-08 NOTE — Telephone Encounter (Signed)
Will hold message and return Kathleen's call on 08/11/16 per request.

## 2016-08-08 NOTE — Telephone Encounter (Signed)
Pt had an elevated potassium level of 5.5 on 6/26.Pt denies taking any K supplements or eating high potassium foods. He is on losartan.  A call was placed to have pt return for repeat lab and message was left by nurse. Pt has not come in for repeat lab. Pt called at home. Management consultant at [facility] called. Arrangements have been made for pt to go to [facility] for redraw of serum potassium. He is instructed to present to the [facility] ED and tell them that he is there for inpatient lab to draw his blood. He and his wife verbalize understanding of instructions. Order for lab on prescription pad faxed to lab.   Daune Perch, AGNP-C 08/08/2016  5:27 PM

## 2016-08-08 NOTE — ED Notes (Signed)
Cardiologist office called saying this patient only needed BMP lab draw; did not need ED visit. I spoke with Lab personal and took patient to lab for outpatient draw.

## 2016-08-11 ENCOUNTER — Ambulatory Visit (INDEPENDENT_AMBULATORY_CARE_PROVIDER_SITE_OTHER)
Admission: RE | Admit: 2016-08-11 | Discharge: 2016-08-11 | Disposition: A | Discharge status: Home / Self Care | Payer: Medicare Other | Source: Ambulatory Visit | Attending: Pulmonary Disease | Admitting: Pulmonary Disease

## 2016-08-11 ENCOUNTER — Telehealth: Payer: Self-pay | Admitting: Interventional Cardiology

## 2016-08-11 DIAGNOSIS — J189 Pneumonia, unspecified organism: Secondary | ICD-10-CM | POA: Diagnosis not present

## 2016-08-11 DIAGNOSIS — R042 Hemoptysis: Secondary | ICD-10-CM

## 2016-08-11 NOTE — Telephone Encounter (Signed)
Pt has been made aware of his lab results. See result note

## 2016-08-11 NOTE — Telephone Encounter (Signed)
Spoke with pt's wife, [name]. Pt is in need of a new nebulizer machine. His current machine is no longer working. Order has been placed. Nothing further was needed.

## 2016-08-11 NOTE — Telephone Encounter (Signed)
New message    Pt is calling about his lab work. Please call.

## 2016-08-11 NOTE — Telephone Encounter (Signed)
-----   Message from Daune Perch, NP sent at 08/11/2016 10:38 AM EDT ----- Please let pt know that his repeat potassium was in the upper range of normal. We will continue to monitor his potassium at his follow up appts.   Daune Perch, NP

## 2016-08-14 ENCOUNTER — Telehealth: Payer: Self-pay | Admitting: Pulmonary Disease

## 2016-08-14 NOTE — Telephone Encounter (Signed)
Spoke with pt's wife who states they do not really like the neb machine that they received from [facility]. They has a specific one that they want but [facility] does not carry it. They feel like the machine they have is not strong enough and does not push out the vapor well enough. They wish to return machine to [facility] and go with a different company. Informed her that we are unsure of what home care company would carry what they are looking for. She states on their end they will call around to different companies and contact us back with the information. Will await her call back

## 2016-08-15 NOTE — Telephone Encounter (Signed)
Waiting on call back from pts wife to let us know which DME they would like to go with for the new neb machine.

## 2016-08-18 NOTE — Telephone Encounter (Signed)
Spoke with pt and he stated so far they are still looking. He states he will talk with his wife when she gets home for further updates. They will call us back

## 2016-08-19 NOTE — Telephone Encounter (Signed)
I have spoken with pt, who states he is still searching DME with strong pressured neb machine. Will await pt's call back.

## 2016-08-20 NOTE — Telephone Encounter (Signed)
Patient's wife, [name] calling.  She asked for nurse to call [facility] [phone]. States it is about the nebulizer machine but would not give me any further information, just asking for nurse to call the patient.

## 2016-08-20 NOTE — Telephone Encounter (Signed)
Patient wife called back - states she would like decline a rx for a new nebulizer machine because all the DME companies she contacted are requiring her to pay out of pocket - so she no longer needs rx - pt doesn't need call back - ph # [phone] if necessary -pr

## 2016-08-21 NOTE — Telephone Encounter (Signed)
Noted. Will close this message.  

## 2016-08-26 ENCOUNTER — Ambulatory Visit: Payer: Medicare Other | Admitting: Family Medicine

## 2016-08-26 ENCOUNTER — Telehealth: Payer: Self-pay | Admitting: Pulmonary Disease

## 2016-08-26 NOTE — Telephone Encounter (Signed)
Spoke with pt and answered questions regarding nebulizer.  Nothing further needed.

## 2016-09-01 ENCOUNTER — Other Ambulatory Visit: Payer: Self-pay | Admitting: Pulmonary Disease

## 2016-09-02 DIAGNOSIS — F331 Major depressive disorder, recurrent, moderate: Secondary | ICD-10-CM | POA: Diagnosis not present

## 2016-09-10 ENCOUNTER — Telehealth: Payer: Self-pay | Admitting: Family Medicine

## 2016-09-10 NOTE — Telephone Encounter (Signed)
Received forms by mail from [facility], placed in bin up front with charge sheet.

## 2016-09-10 NOTE — Telephone Encounter (Signed)
Forms completed and placed in basket 

## 2016-09-11 NOTE — Telephone Encounter (Signed)
Forms have been mailed as request by [facility].

## 2016-09-19 DIAGNOSIS — S22000D Wedge compression fracture of unspecified thoracic vertebra, subsequent encounter for fracture with routine healing: Secondary | ICD-10-CM | POA: Diagnosis not present

## 2016-09-22 ENCOUNTER — Emergency Department (HOSPITAL_COMMUNITY): Payer: Medicare Other

## 2016-09-22 ENCOUNTER — Inpatient Hospital Stay (HOSPITAL_COMMUNITY)
Admission: EM | Admit: 2016-09-22 | Discharge: 2016-09-24 | DRG: 177 | Disposition: A | Discharge status: Hospice | Payer: Medicare Other | Attending: Internal Medicine | Admitting: Internal Medicine

## 2016-09-22 ENCOUNTER — Observation Stay (HOSPITAL_COMMUNITY): Payer: Medicare Other

## 2016-09-22 DIAGNOSIS — J449 Chronic obstructive pulmonary disease, unspecified: Secondary | ICD-10-CM | POA: Diagnosis present

## 2016-09-22 DIAGNOSIS — G9341 Metabolic encephalopathy: Secondary | ICD-10-CM | POA: Diagnosis present

## 2016-09-22 DIAGNOSIS — R531 Weakness: Secondary | ICD-10-CM | POA: Diagnosis not present

## 2016-09-22 DIAGNOSIS — C3431 Malignant neoplasm of lower lobe, right bronchus or lung: Secondary | ICD-10-CM | POA: Diagnosis present

## 2016-09-22 DIAGNOSIS — I252 Old myocardial infarction: Secondary | ICD-10-CM | POA: Diagnosis not present

## 2016-09-22 DIAGNOSIS — Z955 Presence of coronary angioplasty implant and graft: Secondary | ICD-10-CM

## 2016-09-22 DIAGNOSIS — I2510 Atherosclerotic heart disease of native coronary artery without angina pectoris: Secondary | ICD-10-CM | POA: Diagnosis present

## 2016-09-22 DIAGNOSIS — E785 Hyperlipidemia, unspecified: Secondary | ICD-10-CM | POA: Diagnosis present

## 2016-09-22 DIAGNOSIS — Z8673 Personal history of transient ischemic attack (TIA), and cerebral infarction without residual deficits: Secondary | ICD-10-CM | POA: Diagnosis not present

## 2016-09-22 DIAGNOSIS — R4182 Altered mental status, unspecified: Secondary | ICD-10-CM | POA: Diagnosis not present

## 2016-09-22 DIAGNOSIS — J479 Bronchiectasis, uncomplicated: Secondary | ICD-10-CM | POA: Diagnosis present

## 2016-09-22 DIAGNOSIS — G8929 Other chronic pain: Secondary | ICD-10-CM | POA: Diagnosis present

## 2016-09-22 DIAGNOSIS — F319 Bipolar disorder, unspecified: Secondary | ICD-10-CM | POA: Diagnosis present

## 2016-09-22 DIAGNOSIS — I10 Essential (primary) hypertension: Secondary | ICD-10-CM | POA: Diagnosis present

## 2016-09-22 DIAGNOSIS — F310 Bipolar disorder, current episode hypomanic: Secondary | ICD-10-CM | POA: Diagnosis not present

## 2016-09-22 DIAGNOSIS — F4489 Other dissociative and conversion disorders: Secondary | ICD-10-CM | POA: Diagnosis not present

## 2016-09-22 DIAGNOSIS — Z7902 Long term (current) use of antithrombotics/antiplatelets: Secondary | ICD-10-CM

## 2016-09-22 DIAGNOSIS — E7220 Disorder of urea cycle metabolism, unspecified: Secondary | ICD-10-CM | POA: Diagnosis present

## 2016-09-22 DIAGNOSIS — S2241XA Multiple fractures of ribs, right side, initial encounter for closed fracture: Secondary | ICD-10-CM | POA: Diagnosis not present

## 2016-09-22 DIAGNOSIS — I480 Paroxysmal atrial fibrillation: Secondary | ICD-10-CM | POA: Diagnosis not present

## 2016-09-22 DIAGNOSIS — J690 Pneumonitis due to inhalation of food and vomit: Secondary | ICD-10-CM | POA: Diagnosis not present

## 2016-09-22 DIAGNOSIS — Z87891 Personal history of nicotine dependence: Secondary | ICD-10-CM

## 2016-09-22 DIAGNOSIS — Z9981 Dependence on supplemental oxygen: Secondary | ICD-10-CM | POA: Diagnosis not present

## 2016-09-22 DIAGNOSIS — Z888 Allergy status to other drugs, medicaments and biological substances status: Secondary | ICD-10-CM | POA: Diagnosis not present

## 2016-09-22 DIAGNOSIS — M545 Low back pain, unspecified: Secondary | ICD-10-CM | POA: Diagnosis present

## 2016-09-22 DIAGNOSIS — Z79899 Other long term (current) drug therapy: Secondary | ICD-10-CM | POA: Diagnosis not present

## 2016-09-22 DIAGNOSIS — R0902 Hypoxemia: Secondary | ICD-10-CM | POA: Diagnosis not present

## 2016-09-22 DIAGNOSIS — R1310 Dysphagia, unspecified: Secondary | ICD-10-CM | POA: Diagnosis present

## 2016-09-22 DIAGNOSIS — K219 Gastro-esophageal reflux disease without esophagitis: Secondary | ICD-10-CM | POA: Diagnosis present

## 2016-09-22 LAB — CBC WITH DIFFERENTIAL/PLATELET
Basophils Absolute: 0 10*3/uL (ref 0.0–0.1)
Basophils Relative: 0 %
Eosinophils Absolute: 0.1 10*3/uL (ref 0.0–0.7)
Eosinophils Relative: 1 %
HCT: 38.4 % — ABNORMAL LOW (ref 39.0–52.0)
Hemoglobin: 12.8 g/dL — ABNORMAL LOW (ref 13.0–17.0)
Lymphocytes Relative: 26 %
Lymphs Abs: 1.5 10*3/uL (ref 0.7–4.0)
MCH: 30.8 pg (ref 26.0–34.0)
MCHC: 33.3 g/dL (ref 30.0–36.0)
MCV: 92.5 fL (ref 78.0–100.0)
Monocytes Absolute: 0.5 10*3/uL (ref 0.1–1.0)
Monocytes Relative: 9 %
Neutro Abs: 3.5 10*3/uL (ref 1.7–7.7)
Neutrophils Relative %: 64 %
Platelets: 220 10*3/uL (ref 150–400)
RBC: 4.15 MIL/uL — ABNORMAL LOW (ref 4.22–5.81)
RDW: 14.8 % (ref 11.5–15.5)
WBC: 5.5 10*3/uL (ref 4.0–10.5)

## 2016-09-22 LAB — COMPREHENSIVE METABOLIC PANEL
ALT: 12 U/L — ABNORMAL LOW (ref 17–63)
AST: 22 U/L (ref 15–41)
Albumin: 3.6 g/dL (ref 3.5–5.0)
Alkaline Phosphatase: 37 U/L — ABNORMAL LOW (ref 38–126)
Anion gap: 7 (ref 5–15)
BUN: 16 mg/dL (ref 6–20)
CO2: 31 mmol/L (ref 22–32)
Calcium: 9 mg/dL (ref 8.9–10.3)
Chloride: 98 mmol/L — ABNORMAL LOW (ref 101–111)
Creatinine, Ser: 0.72 mg/dL (ref 0.61–1.24)
GFR calc Af Amer: >60 mL/min (ref >60)
GFR calc non Af Amer: >60 mL/min (ref >60)
Glucose, Bld: 111 mg/dL — ABNORMAL HIGH (ref 65–99)
Potassium: 4.3 mmol/L (ref 3.5–5.1)
Sodium: 136 mmol/L (ref 135–145)
Total Bilirubin: 0.4 mg/dL (ref 0.3–1.2)
Total Protein: 6.4 g/dL — ABNORMAL LOW (ref 6.5–8.1)

## 2016-09-22 LAB — URINALYSIS, ROUTINE W REFLEX MICROSCOPIC
Bilirubin Urine: NEGATIVE
Glucose, UA: NEGATIVE mg/dL
Hgb urine dipstick: NEGATIVE
Ketones, ur: NEGATIVE mg/dL
Leukocytes, UA: NEGATIVE
Nitrite: NEGATIVE
Protein, ur: NEGATIVE mg/dL
Specific Gravity, Urine: 1.013 (ref 1.005–1.030)
pH: 7 (ref 5.0–8.0)

## 2016-09-22 LAB — AMMONIA: AMMONIA: 66 umol/L — AB (ref 9–35)

## 2016-09-22 LAB — I-STAT CG4 LACTIC ACID, ED: Lactic Acid, Venous: 0.91 mmol/L (ref 0.5–1.9)

## 2016-09-22 LAB — MAGNESIUM: MAGNESIUM: 1.8 mg/dL (ref 1.7–2.4)

## 2016-09-22 MED ORDER — ONDANSETRON HCL 4 MG PO TABS: 4.0000 mg | ORAL_TABLET | Freq: Four times a day (QID) | ORAL | Status: DC | PRN

## 2016-09-22 MED ORDER — PANTOPRAZOLE SODIUM 40 MG PO TBEC
40.0000 mg | DELAYED_RELEASE_TABLET | Freq: Every day | ORAL | Status: DC
  Administered 2016-09-23: 40 mg via ORAL
  Filled 2016-09-22: qty 1

## 2016-09-22 MED ORDER — SODIUM CHLORIDE 0.9 % IV BOLUS (SEPSIS)
1000.0000 mL | Freq: Once | INTRAVENOUS | Status: AC
  Administered 2016-09-22: 1000 mL via INTRAVENOUS

## 2016-09-22 MED ORDER — DILTIAZEM HCL ER COATED BEADS 240 MG PO CP24
240.0000 mg | ORAL_CAPSULE | Freq: Every day | ORAL | Status: DC
  Administered 2016-09-23: 240 mg via ORAL
  Filled 2016-09-22: qty 1

## 2016-09-22 MED ORDER — DIPHENHYDRAMINE HCL 25 MG PO CAPS: 25.0000 mg | ORAL_CAPSULE | Freq: Two times a day (BID) | ORAL | Status: DC | PRN

## 2016-09-22 MED ORDER — PROMETHAZINE HCL 25 MG PO TABS
25.0000 mg | ORAL_TABLET | Freq: Four times a day (QID) | ORAL | Status: DC | PRN
Start: 2016-09-22 — End: 2016-09-24
  Administered 2016-09-23: 25 mg via ORAL
  Filled 2016-09-22: qty 1

## 2016-09-22 MED ORDER — FAMOTIDINE 20 MG PO TABS
20.0000 mg | ORAL_TABLET | Freq: Two times a day (BID) | ORAL | Status: DC
  Administered 2016-09-22 – 2016-09-23 (×3): 20 mg via ORAL
  Filled 2016-09-22 (×3): qty 1

## 2016-09-22 MED ORDER — FLUTICASONE PROPIONATE 50 MCG/ACT NA SUSP
2.0000 | Freq: Every day | NASAL | Status: DC | PRN
  Administered 2016-09-24: 2 via NASAL
  Filled 2016-09-22 (×2): qty 16

## 2016-09-22 MED ORDER — GUAIFENESIN ER 600 MG PO TB12
600.0000 mg | ORAL_TABLET | Freq: Two times a day (BID) | ORAL | Status: DC
  Administered 2016-09-22 – 2016-09-23 (×3): 600 mg via ORAL
  Filled 2016-09-22 (×3): qty 1

## 2016-09-22 MED ORDER — LORATADINE 10 MG PO TABS
10.0000 mg | ORAL_TABLET | Freq: Every day | ORAL | Status: DC
  Administered 2016-09-23: 10 mg via ORAL
  Filled 2016-09-22: qty 1

## 2016-09-22 MED ORDER — DIVALPROEX SODIUM ER 500 MG PO TB24
1000.0000 mg | ORAL_TABLET | Freq: Every day | ORAL | Status: DC
  Administered 2016-09-22 – 2016-09-23 (×2): 1000 mg via ORAL
  Filled 2016-09-22 (×2): qty 2

## 2016-09-22 MED ORDER — DOCUSATE SODIUM 100 MG PO CAPS
200.0000 mg | ORAL_CAPSULE | Freq: Every day | ORAL | Status: DC
  Administered 2016-09-22 – 2016-09-23 (×2): 200 mg via ORAL
  Filled 2016-09-22 (×2): qty 2

## 2016-09-22 MED ORDER — DABIGATRAN ETEXILATE MESYLATE 150 MG PO CAPS
150.0000 mg | ORAL_CAPSULE | Freq: Two times a day (BID) | ORAL | Status: DC
  Administered 2016-09-22 – 2016-09-23 (×3): 150 mg via ORAL
  Filled 2016-09-22 (×4): qty 1

## 2016-09-22 MED ORDER — LOSARTAN POTASSIUM 50 MG PO TABS
50.0000 mg | ORAL_TABLET | Freq: Every day | ORAL | Status: DC
  Administered 2016-09-22 – 2016-09-23 (×2): 50 mg via ORAL
  Filled 2016-09-22 (×2): qty 1

## 2016-09-22 MED ORDER — ALBUTEROL SULFATE (2.5 MG/3ML) 0.083% IN NEBU: 2.5000 mg | INHALATION_SOLUTION | RESPIRATORY_TRACT | Status: DC | PRN

## 2016-09-22 MED ORDER — METOPROLOL TARTRATE 50 MG PO TABS
50.0000 mg | ORAL_TABLET | Freq: Two times a day (BID) | ORAL | Status: DC
  Administered 2016-09-22 – 2016-09-23 (×3): 50 mg via ORAL
  Filled 2016-09-22 (×3): qty 1

## 2016-09-22 MED ORDER — DULOXETINE HCL 60 MG PO CPEP
120.0000 mg | ORAL_CAPSULE | Freq: Every day | ORAL | Status: DC
  Administered 2016-09-23: 120 mg via ORAL
  Filled 2016-09-22: qty 2

## 2016-09-22 MED ORDER — MONTELUKAST SODIUM 10 MG PO TABS
10.0000 mg | ORAL_TABLET | Freq: Every day | ORAL | Status: DC
  Administered 2016-09-22 – 2016-09-23 (×2): 10 mg via ORAL
  Filled 2016-09-22 (×2): qty 1

## 2016-09-22 MED ORDER — ASPIRIN-ACETAMINOPHEN-CAFFEINE 250-250-65 MG PO TABS
2.0000 | ORAL_TABLET | Freq: Three times a day (TID) | ORAL | Status: DC | PRN
  Filled 2016-09-22: qty 2

## 2016-09-22 MED ORDER — BUDESONIDE 0.5 MG/2ML IN SUSP
0.5000 mg | Freq: Two times a day (BID) | RESPIRATORY_TRACT | Status: DC
  Administered 2016-09-23 – 2016-09-24 (×3): 0.5 mg via RESPIRATORY_TRACT
  Filled 2016-09-22 (×3): qty 2

## 2016-09-22 MED ORDER — ARIPIPRAZOLE 5 MG PO TABS
5.0000 mg | ORAL_TABLET | Freq: Every day | ORAL | Status: DC
  Administered 2016-09-23: 5 mg via ORAL
  Filled 2016-09-22: qty 1

## 2016-09-22 MED ORDER — ENSURE ENLIVE PO LIQD
237.0000 mL | Freq: Every day | ORAL | Status: DC
  Administered 2016-09-23: 237 mL via ORAL

## 2016-09-22 MED ORDER — SODIUM CHLORIDE 0.9 % IV SOLN
INTRAVENOUS | Status: DC
  Administered 2016-09-22 – 2016-09-24 (×3): via INTRAVENOUS

## 2016-09-22 MED ORDER — BUDESONIDE 0.25 MG/2ML IN SUSP
0.5000 mg | Freq: Two times a day (BID) | RESPIRATORY_TRACT | Status: DC
  Administered 2016-09-22: 0.5 mg via RESPIRATORY_TRACT
  Filled 2016-09-22: qty 4

## 2016-09-22 MED ORDER — HYDROCODONE-ACETAMINOPHEN 10-325 MG PO TABS
1.0000 | ORAL_TABLET | Freq: Four times a day (QID) | ORAL | Status: DC | PRN
  Administered 2016-09-22 – 2016-09-24 (×4): 1 via ORAL
  Filled 2016-09-22 (×4): qty 1

## 2016-09-22 MED ORDER — PRIMIDONE 250 MG PO TABS
250.0000 mg | ORAL_TABLET | Freq: Two times a day (BID) | ORAL | Status: DC
  Administered 2016-09-22 – 2016-09-23 (×3): 250 mg via ORAL
  Filled 2016-09-22 (×4): qty 1

## 2016-09-22 MED ORDER — DIAZEPAM 5 MG PO TABS: 5.0000 mg | ORAL_TABLET | Freq: Three times a day (TID) | ORAL | Status: DC | PRN

## 2016-09-22 MED ORDER — LACTULOSE 10 GM/15ML PO SOLN
20.0000 g | Freq: Two times a day (BID) | ORAL | Status: DC
  Administered 2016-09-22 – 2016-09-23 (×3): 20 g via ORAL
  Filled 2016-09-22 (×3): qty 30

## 2016-09-22 MED ORDER — VITAMIN D3 25 MCG (1000 UNIT) PO TABS
1000.0000 [IU] | ORAL_TABLET | Freq: Every morning | ORAL | Status: DC
  Administered 2016-09-23: 1000 [IU] via ORAL
  Filled 2016-09-22: qty 1

## 2016-09-22 MED ORDER — AMPICILLIN-SULBACTAM SODIUM 3 (2-1) G IJ SOLR
3.0000 g | Freq: Four times a day (QID) | INTRAMUSCULAR | Status: DC
  Administered 2016-09-22 – 2016-09-24 (×7): 3 g via INTRAVENOUS
  Filled 2016-09-22 (×9): qty 3

## 2016-09-22 MED ORDER — IPRATROPIUM-ALBUTEROL 0.5-2.5 (3) MG/3ML IN SOLN
3.0000 mL | Freq: Four times a day (QID) | RESPIRATORY_TRACT | Status: DC
  Administered 2016-09-23 – 2016-09-24 (×5): 3 mL via RESPIRATORY_TRACT
  Filled 2016-09-22 (×5): qty 3

## 2016-09-22 MED ORDER — TIZANIDINE HCL 4 MG PO TABS: 4.0000 mg | ORAL_TABLET | Freq: Three times a day (TID) | ORAL | Status: DC | PRN

## 2016-09-22 MED ORDER — IPRATROPIUM-ALBUTEROL 0.5-2.5 (3) MG/3ML IN SOLN
3.0000 mL | Freq: Four times a day (QID) | RESPIRATORY_TRACT | Status: DC
  Administered 2016-09-22: 3 mL via RESPIRATORY_TRACT
  Filled 2016-09-22: qty 3

## 2016-09-22 MED ORDER — ONDANSETRON HCL 4 MG/2ML IJ SOLN
4.0000 mg | Freq: Four times a day (QID) | INTRAMUSCULAR | Status: DC | PRN
Start: 2016-09-22 — End: 2016-09-24

## 2016-09-22 MED ORDER — POLYETHYLENE GLYCOL 3350 17 G PO PACK
17.0000 g | PACK | Freq: Every evening | ORAL | Status: DC
  Administered 2016-09-22: 17 g via ORAL
  Filled 2016-09-22: qty 1

## 2016-09-22 MED ORDER — NITROGLYCERIN 0.4 MG SL SUBL: 0.4000 mg | SUBLINGUAL_TABLET | SUBLINGUAL | Status: DC | PRN

## 2016-09-22 MED ORDER — PREDNISONE 10 MG PO TABS
10.0000 mg | ORAL_TABLET | Freq: Every day | ORAL | Status: DC
  Administered 2016-09-23: 10 mg via ORAL
  Filled 2016-09-22 (×2): qty 1

## 2016-09-22 NOTE — ED Provider Notes (Signed)
[facility] DEPT Provider Note   CSN: [id number] Arrival date & time: 09/22/16  1343     History   Chief Complaint Chief Complaint  Patient presents with  . Altered Mental Status    HPI Malik Zuniga is a 71 y.o. male.  HPI level V caveat due to altered mental status  71 year old male presents today with reported altered mental status. Patient's wife is at bedside who provides a majority of his history. She reports a history of dysphagia due to none known cause, wife notes likely due to Lamictal. She notes he frequently has episodes of aspiration pneumonia. Most recently in June he had episode that required hospital admission. She notes full recovery after his last hospitalization. Approximately 2 weeks ago patient developed a productive cough and was diagnosed with again pneumonia. He was placed on Levaquin for 10 days, follow-up with [facility] on Friday, they extended his treatment out switching to Augmentin at that time. She notes that yesterday patient went to church and was at his baseline. She reports upon awakening this morning patient seemed altered like previous episodes of pneumonia. She reports he still has a productive cough, denies any fever, has been eating and drinking appropriately. Patient denies any chest pain, denies any other infectious etiology.    Past Medical History:  Diagnosis Date  . Anemia associated with acute blood loss   . Angioedema 10/31/2010  . Anxiety    takes Valium daily  . Aspiration pneumonia ([place]) 2010  . Asthma   . Bacteremia due to Pseudomonas 10/04/2011   D/t gram neg rods   . Bipolar affective disorder ([place])   . 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 Chronic back pain    compression fracture  . Constipation    takes Colace daily as well as Miralax  . Coronary artery disease   . Depression    takes Cymbalta daily  . Emphysema of lung (HCC)    Albuterol as needed;Symbicort daily and  Singulair at bedtime  . GERD (gastroesophageal reflux disease)    takes Omeprazole daily  . Headache, chronic daily   . History of bronchitis   . History of colon polyps   . History of migraine    last migraine a couple of days ago;takes Excedrin Migraine  . History of prostate cancer 2004  . Hyperlipidemia    takes Simvastatin daily  . Hypertension    takes Metoprolol daily as well as Hyzaar  . Insomnia    takes Benadryl nightly  . Lung cancer ([place]) 2016  . Mood change ([place])    after Brain surgery mood changed and was placed on Depakote  . Myocardial infarction ([place]) 04/1998  . Neuromuscular disorder ([place]) 1998   right carpal tunnel release  . On home O2   . Shortness of breath    with exertion  . 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 Tremor     Patient Active Problem List   Diagnosis Date Noted  . Altered mental status 09/22/2016  . Hyperammonemia ([place]) 09/22/2016  . Hypotension 07/28/2016  . Severe sepsis ([employer]) 07/28/2016  . Aspiration pneumonia ([place]) 07/28/2016  . 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 Chronic combined systolic (congestive) and diastolic (congestive) heart failure ([place]) 07/28/2016  . Chest pain 07/28/2016  . Chronic anticoagulation 11/01/2015  . Cerebrovascular disease, arteriosclerotic, post-stroke 08/07/2015  . Acute delirium 08/07/2015  . Paroxysmal  atrial fibrillation ([place])   . Diplopia   . COPD (chronic obstructive pulmonary disease) ([place])   . Bronchiectasis without acute exacerbation ([place]) 08/02/2015  . Atrial flutter with rapid ventricular response ([place]) 08/02/2015  . Tinea cruris 06/05/2015  . Dysphagia 02/01/2015  . Primary cancer of right lower lobe of lung ([place]) 12/05/2014  . CAD (coronary artery disease) 07/27/2013  . Thoracic compression fracture ([place]) 06/21/2013  . Thoracic spine fracture ([place]) 06/21/2013  . Giant comedone 11/04/2012  . Chronic lower back pain 07/22/2012  . Prostate cancer  ([place]) 04/22/2012  . 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 Hyperlipidemia 02/13/2010  . Bipolar disorder ([place]) 02/13/2010  . DEPRESSION 02/13/2010  . Essential hypertension 02/13/2010  . TREMOR 02/13/2010  . Hx of TIA (transient ischemic attack) and stroke 02/13/2010    Past Surgical History:  Procedure Laterality Date  . BACK SURGERY  08/2004; 02/2005; 04/2006; 06/2007; 7/20101   all by Dr. Annette Stable  . BACK SURGERY  05/2013  . BRAIN SURGERY  1999   clip aneurysm  . [place]   right  . CHOLECYSTECTOMY  1996  . COLONOSCOPY    . CORONARY ANGIOPLASTY WITH STENT PLACEMENT  2000   RCA stent, Dr. Rollene Fare; denies stent for PAD 06/15/13  . CORONARY ANGIOPLASTY WITH STENT PLACEMENT  04/1998  . CRANIOTOMY  1999   to clip aneurism, Dr. Annette Stable  . DG BIOPSY LUNG    . ESOPHAGOGASTRODUODENOSCOPY N/A 07/23/2012   Procedure: ESOPHAGOGASTRODUODENOSCOPY (EGD);  Surgeon: Jeryl Columbia, MD;  Location: [facility] ENDOSCOPY;  Service: Endoscopy;  Laterality: N/A;  buccini /ja  . GASTROSTOMY W/ FEEDING TUBE  2008   Dr. Watt Climes; only had for a few months  . HAND SURGERY  1989   crushed thumb, Dr. Fredna Dow  . PROSTATECTOMY  04/2001   removal of prostate cancer, Dr. Janice Norrie  . SPINE SURGERY    . [place]   left arm, Dr. Fredna Dow       Home Medications    Prior to Admission medications   Medication Sig Start Date End Date Taking? Authorizing Provider  amoxicillin-clavulanate (AUGMENTIN) 875-125 MG tablet Take 1 tablet by mouth every 12 (twelve) hours. Patient taking differently: Take 1 tablet by mouth every 12 (twelve) hours. Started 08/13 for 10 days 07/31/16  Yes Sheikh, Omair Latif, DO  ARIPiprazole (ABILIFY) 10 MG tablet Take 5 mg by mouth daily.  05/09/14  Yes [provider]  aspirin-acetaminophen-caffeine (EXCEDRIN MIGRAINE) [phone] MG tablet Take 2 tablets by mouth 3 (three) times daily.    Yes [provider]  atorvastatin (LIPITOR) 20 MG tablet Take 1 tablet (20 mg total)  by mouth daily at 6 PM. 03/07/16  Yes Belva Crome, MD  budesonide (PULMICORT) 0.25 MG/2ML nebulizer solution Take 4 mLs (0.5 mg total) by nebulization 2 (two) times daily. 12/07/13  Yes Clance, Armando Reichert, MD  cetirizine (ZYRTEC) 10 MG tablet Take 10 mg by mouth daily.   Yes [provider]  cholecalciferol (VITAMIN D) 1000 UNITS tablet Take 1,000 Units by mouth every morning.    Yes [provider]  dabigatran (PRADAXA) 150 MG CAPS capsule Take 1 capsule (150 mg total) by mouth every 12 (twelve) hours. 03/19/16  Yes Belva Crome, MD  diazepam (VALIUM) 5 MG tablet Take 1 tablet (5 mg total) by mouth every 8 (eight) hours as needed for anxiety or muscle spasms. scheduled Patient taking differently: 5 mg 2 (two) times daily. scheduled 06/29/15  Yes [name]  L, NP  diltiazem (CARDIZEM CD) 240 MG 24 hr capsule Take 1 capsule (240 mg total) by mouth daily. 08/05/16 11/03/16 Yes Daune Perch, NP  DiphenhydrAMINE HCl, Sleep, (ZZZQUIL) 25 MG CAPS Take 2 capsules by mouth at bedtime.   Yes [provider]  divalproex (DEPAKOTE) 500 MG 24 hr tablet Take 1,000 mg by mouth at bedtime.    Yes [provider]  docusate sodium (COLACE) 100 MG capsule Take 200 mg by mouth at bedtime.    Yes [provider]  DULoxetine (CYMBALTA) 60 MG capsule Take 120 mg by mouth daily.    Yes [provider]  feeding supplement, ENSURE ENLIVE, (ENSURE ENLIVE) LIQD Take 237 mLs by mouth daily.   Yes [provider]  fluticasone (FLONASE) 50 MCG/ACT nasal spray Place 2 sprays into both nostrils daily as needed for allergies or rhinitis.   Yes [provider]  guaiFENesin (MUCINEX) 600 MG 12 hr tablet Take 1 tablet (600 mg total) by mouth 2 (two) times daily. 07/30/16  Yes Sheikh, Omair Latif, DO  HYDROcodone-acetaminophen (NORCO) 10-325 MG tablet Take 1 tablet by mouth 4 (four) times daily.  01/07/16  Yes [provider]  ipratropium-albuterol  (DUONEB) 0.5-2.5 (3) MG/3ML SOLN Take 3 mLs by nebulization every 6 (six) hours. 12/07/13  Yes Clance, Armando Reichert, MD  levofloxacin (LEVAQUIN) 750 MG tablet Take 750 mg by mouth daily. Started 08/08 for 5 days 09/16/16  Yes [provider]  losartan (COZAAR) 50 MG tablet Take 50 mg by mouth at bedtime.    Yes [provider]  Maltodextrin-Xanthan Gum (RESOURCE THICKENUP CLEAR) POWD Take 1 g by mouth as needed. 01/24/16  Yes Eugenie Filler, MD  metoprolol tartrate (LOPRESSOR) 50 MG tablet Take 1 tablet (50 mg total) by mouth 2 (two) times daily. 08/05/16 11/03/16 Yes Daune Perch, NP  montelukast (SINGULAIR) 10 MG tablet TAKE 1 TABLET AT BEDTIME 07/29/16  Yes [name], MD  nitroGLYCERIN (NITROSTAT) 0.4 MG SL tablet PLACE 1 TABLET (0.4 MG TOTAL) UNDER THE TONGUE EVERY 5 (FIVE) MINUTES AS NEEDED FOR CHEST PAIN. 08/05/16  Yes Daune Perch, NP  omeprazole (PRILOSEC) 40 MG capsule Take 40 mg by mouth 2 (two) times daily.    Yes [provider]  OXYGEN Inhale 2.5 L into the lungs at bedtime.   Yes [provider]  polyethylene glycol (MIRALAX / GLYCOLAX) packet Take 17 g by mouth every evening.    Yes [provider]  predniSONE (DELTASONE) 10 MG tablet TAKE 1 TABLET (10 MG TOTAL) BY MOUTH DAILY WITH BREAKFAST. 09/01/16  Yes [name], MD  primidone (MYSOLINE) 250 MG tablet TAKE 1 TABLET TWICE DAILY 07/29/16  Yes [name], MD  promethazine (PHENERGAN) 25 MG tablet Take 25 mg by mouth every 6 (six) hours as needed for nausea, vomiting or refractory nausea / vomiting.  04/15/16  Yes [provider]  ranitidine (ZANTAC) 150 MG tablet Take 300 mg by mouth 2 (two) times daily.    Yes [provider]  sodium chloride HYPERTONIC 3 % nebulizer solution Take by nebulization 2 (two) times daily. Patient taking differently: Take 2-4 mLs by nebulization daily.  04/03/16  Yes [name], MD  tiZANidine (ZANAFLEX) 4 MG  tablet TAKE 1 TABLET BY MOUTH EVERY 8 HOURS AS NEEDED FOR MUSCLE SPASMS. 05/21/16  Yes [name], MD    Family History Family History  Problem Relation Age of Onset  . Heart disease Mother   .  Cancer Father        brain cancer and prostate cancer     Social History Social History  Substance Use Topics  . Smoking status: Former Smoker    Packs/day: 1.50    Years: 52.00    Types: Cigarettes    Quit date: 07/17/2012  . Smokeless tobacco: Never Used     Comment: Former smoker  . Alcohol use No     Allergies   Lisinopril; Lamictal [lamotrigine]; and Ambien [zolpidem]   Review of Systems Review of Systems  All other systems reviewed and are negative.   Physical Exam Updated Vital Signs BP 118/71   Pulse 74   Temp 97.8 F (36.6 C) (Oral)   Resp 15   SpO2 98%   Physical Exam  Constitutional: He is oriented to person, place, and time. He appears well-developed and well-nourished.  HENT:  Head: Normocephalic and atraumatic.  Eyes: Pupils are equal, round, and reactive to light. Conjunctivae are normal. Right eye exhibits no discharge. Left eye exhibits no discharge. No scleral icterus.  Neck: Normal range of motion. No JVD present. No tracheal deviation present.  Pulmonary/Chest: Effort normal. No stridor.  Limited exam due to patients effort- diminished bilateral no abnormalities noted  Neurological: He is alert and oriented to person, place, and time. A sensory deficit is present. No cranial nerve deficit. Coordination normal.  Upper and lower extremity sensation and strength or motor function intact with the exception of the right lower extremity. Decreased sensation to entirety of right lower extremity with 5 out of 5 strength but diminished compared to left this is baseline for patient  Psychiatric: He has a normal mood and affect. His behavior is normal. Judgment and thought content normal.  Nursing note and vitals reviewed.   ED Treatments / Results   Labs (all labs ordered are listed, but only abnormal results are displayed) Labs Reviewed  COMPREHENSIVE METABOLIC PANEL - Abnormal; Notable for the following:       Result Value   Chloride 98 (*)    Glucose, Bld 111 (*)    Total Protein 6.4 (*)    ALT 12 (*)    Alkaline Phosphatase 37 (*)    All other components within normal limits  CBC WITH DIFFERENTIAL/PLATELET - Abnormal; Notable for the following:    RBC 4.15 (*)    Hemoglobin 12.8 (*)    HCT 38.4 (*)    All other components within normal limits  AMMONIA - Abnormal; Notable for the following:    Ammonia 66 (*)    All other components within normal limits  CULTURE, BLOOD (ROUTINE X 2)  CULTURE, BLOOD (ROUTINE X 2)  URINALYSIS, ROUTINE W REFLEX MICROSCOPIC  MAGNESIUM  I-STAT CG4 LACTIC ACID, ED    EKG  EKG Interpretation None       Radiology Dg Chest 2 View  Result Date: 09/22/2016 CLINICAL DATA:  Nausea, weakness, former smoker. EXAM: CHEST  2 VIEW COMPARISON:  Chest x-ray of July 28 2016 FINDINGS: The frontal images are limited due to the patient's clinical condition. The lungs appear well-expanded. There is no focal infiltrate. The heart and pulmonary vascularity are normal. No pleural effusion is evident. The patient has undergone lower thoracic and upper lumbar fusion posteriorly. IMPRESSION: Limited study due to patient's clinical condition. No definite acute cardiopulmonary abnormality. Electronically Signed   By: Alvar  [place]ique M.D.   On: 09/22/2016 14:46   Ct Head Wo Contrast  Result Date: 09/22/2016 CLINICAL DATA:  Altered mental status  EXAM: CT HEAD WITHOUT CONTRAST TECHNIQUE: Contiguous axial images were obtained from the base of the skull through the vertex without intravenous contrast. COMPARISON:  05/22/2011, MRI 09/07/2015 FINDINGS: Brain: No acute territorial infarction, hemorrhage, or intracranial mass is seen. 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 Mild small vessel ischemic changes of the bilateral  white matter. Mild to moderate atrophy. Old lacunar infarct in the left basal ganglia. Stable ventricle size. Vascular: No hyperdense vessels. Scattered calcifications at the carotid siphons. Skull: Prior left frontal temp oral craniotomy.  No fracture. Sinuses/Orbits: Mild mucosal thickening in the maxillary and ethmoid sinuses. No acute orbital abnormality. Other: None IMPRESSION: 1. No CT evidence for acute intracranial abnormality. 2. Stable craniotomy changes on the left with evidence of prior left MCA aneurysm clipping Electronically Signed   By: Donavan Foil M.D.   On: 09/22/2016 18:25   Ct Chest Wo Contrast  Result Date: 09/22/2016 CLINICAL DATA:  Weakness history of COPD and lung cancer EXAM: CT CHEST WITHOUT CONTRAST TECHNIQUE: Multidetector CT imaging of the chest was performed following the standard protocol without IV contrast. COMPARISON:  Radiograph 09/22/2016, CT chest 08/11/2016, 04/16/2016 FINDINGS: Cardiovascular: Limited evaluation without the presence of intravenous contrast. Aortic atherosclerosis. Ectatic ascending aorta, measuring up to 3.9 cm. Coronary artery calcification. Normal heart size. No pericardial effusion. Mediastinum/Nodes: Stable subcentimeter mediastinal lymph nodes. No new or enlarging adenopathy. Midline trachea. No thyroid mass. Esophagus within normal limits. Lungs/Pleura: Moderate emphysema. Status post right lower lobe wedge resection with presumed scarring in the right lower lobe and mild bronchiectasis. Minimal bronchiectasis medial left lung base with mild opacity likely scarring, improved aeration since the prior CT. There is no acute pulmonary infiltrate visualized. No pleural effusion or pneumothorax is seen. Upper Abdomen: Stable subcentimeter focus in the right hepatic lobe too small to further characterize. Stable cyst left lobe of the liver. No acute abnormality is seen. Surgical clips in the gallbladder fossa. Musculoskeletal: Right eighth and ninth rib  fractures re- demonstrated with some callus about the ninth rib fracture. Posterior stabilization rods and fixating screws at T10 through the upper lumbar spine. Lucent defect within T9 vertebral body likely due to prior hardware. Osseous fusion T8-T9 with chronic wedging deformity of T8. IMPRESSION: 1. Postsurgical changes in the right lower lobe with scarring present. Minimal consolidation left lower lobe may also be due to scarring. There is overall improved aeration of the left lung base compared to the prior CT. There is no new infiltrate visualized. 2. Moderate emphysema 3. Stable right eighth and ninth rib fractures. Aortic Atherosclerosis (ICD10-I70.0) and Emphysema (ICD10-J43.9). Electronically Signed   By: Donavan Foil M.D.   On: 09/22/2016 20:24    Procedures Procedures (including critical care time)  Medications Ordered in ED Medications  ipratropium-albuterol (DUONEB) 0.5-2.5 (3) MG/3ML nebulizer solution 3 mL (not administered)  lactulose (CHRONULAC) 10 GM/15ML solution 20 g (not administered)  Ampicillin-Sulbactam (UNASYN) 3 g in sodium chloride 0.9 % 100 mL IVPB (not administered)  sodium chloride 0.9 % bolus 1,000 mL (not administered)  sodium chloride 0.9 % bolus 1,000 mL (0 mLs Intravenous Stopped 09/22/16 1733)     Initial Impression / Assessment and Plan / ED Course  I have reviewed the triage vital signs and the nursing notes.  Pertinent labs & imaging results that were available during my care of the patient were reviewed by me and considered in my medical decision making (see chart for details).      Final Clinical Impressions(s) / ED Diagnoses   Final  diagnoses:  Altered mental status, unspecified altered mental status type   Labs: CMP, blood cultures, CBC  Imaging: DG chest 2 view  Consults:[facility]:  Discharge Meds:   Assessment/Plan:  71 year old male presents today with altered mental status likely secondary to aspiration pneumonia. Plain  film showed no acute pulmonary process, wife reports ongoing productive cough. Patient has encephalopathic here, round initial workup reassuring, due to ongoing altered mental status tried hospitalist will be consult at 4 admission. Patient has no acute focal neurological deficits other than his baseline have low suspicion for acute intracranial abnormality   New Prescriptions New Prescriptions   No medications on file     Francee Gentile 09/22/16 2155    Fatima Blank, MD 09/23/16 [phone]

## 2016-09-22 NOTE — ED Notes (Signed)
Checked with pt for a urine sample, no urine obtained at this time.

## 2016-09-22 NOTE — H&P (Signed)
History and Physical    Malik Zuniga Malik Zuniga DOB: [birth year] DOA: 09/22/2016  PCP: [name], MD   Patient coming from: Home.  I have personally briefly reviewed patient's old medical records in [place]  Chief Complaint: Altered mental status.  HPI: Malik Zuniga is a 71 y.o. male with medical history significant of anemia, angioedema, aspiration pneumonia, anxiety, asthma/COPD, history of bacteremia due to Pseudomonas, bipolar affective disorder, chronic back pain, CAD, history of MI, lung cancer, chronic atrial fibrillation who was brought to the emergency department via EMS due to altered mental status since early this morning similar to what the patient does when he is getting an aspiration pneumonia per patient's wife. He was recently put on Levaquin orally as an outpatient for pneumonia and was switched on Friday to Augmentin. Patient's wife reports of productive cough, occasional wheezing, but denies fever, chills, anorexia, chest pain, abdominal pain, nausea, emesis, diarrhea, dysuria, frequency or hematuria.  ED Course: Initial vital signs temperature 97.76F, respirations 14, O2 sat 94% on room air, pulse 80 and blood pressure 111/73 mmHg. The patient was given a 1 L normal saline bolus. His lab work showed normal urinalysis, normal lactic acid, WBC of 5.5, hemoglobin 12.8 g/dL and platelets 220. His CMP showed a chloride of 98, glucose of 111 and total protein 6.4, otherwise was within normal limits. Chest radiograph was poor quality. CT scan chest without contrast showed left lower lobe infiltrate, likely aspiration pneumonia.  Review of Systems: As per HPI otherwise 10 point review of systems negative.    Past Medical History:  Diagnosis Date  . Anemia associated with acute blood loss   . Angioedema 10/31/2010  . Anxiety    takes Valium daily  . Aspiration pneumonia ([place]) 2010  . Asthma   . Bacteremia due to Pseudomonas 10/04/2011   D/t gram neg  rods   . Bipolar affective disorder ([place])   . 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 Chronic back pain    compression fracture  . Constipation    takes Colace daily as well as Miralax  . Coronary artery disease   . Depression    takes Cymbalta daily  . Emphysema of lung (HCC)    Albuterol as needed;Symbicort daily and Singulair at bedtime  . GERD (gastroesophageal reflux disease)    takes Omeprazole daily  . Headache, chronic daily   . History of bronchitis   . History of colon polyps   . History of migraine    last migraine a couple of days ago;takes Excedrin Migraine  . History of prostate cancer 2004  . Hyperlipidemia    takes Simvastatin daily  . Hypertension    takes Metoprolol daily as well as Hyzaar  . Insomnia    takes Benadryl nightly  . Lung cancer ([place]) 2016  . Mood change ([place])    after Brain surgery mood changed and was placed on Depakote  . Myocardial infarction ([place]) 04/1998  . Neuromuscular disorder ([place]) 1998   right carpal tunnel release  . On home O2   . Shortness of breath    with exertion  . Stroke [facility]) 1998   Brain Aneurysm  . Tremor     Past Surgical History:  Procedure Laterality Date  . BACK SURGERY  08/2004; 02/2005; 04/2006; 06/2007; 7/20101   all by Dr. Annette Stable  . BACK SURGERY  05/2013  . [place]  clip aneurysm  . [place]   right  . CHOLECYSTECTOMY  1996  . COLONOSCOPY    . CORONARY ANGIOPLASTY WITH STENT PLACEMENT  2000   RCA stent, Dr. Rollene Fare; denies stent for PAD 06/15/13  . CORONARY ANGIOPLASTY WITH STENT PLACEMENT  04/1998  . CRANIOTOMY  1999   to clip aneurism, Dr. Annette Stable  . DG BIOPSY LUNG    . ESOPHAGOGASTRODUODENOSCOPY N/A 07/23/2012   Procedure: ESOPHAGOGASTRODUODENOSCOPY (EGD);  Surgeon: Jeryl Columbia, MD;  Location: [facility] ENDOSCOPY;  Service: Endoscopy;  Laterality: N/A;  buccini /ja  . GASTROSTOMY W/ FEEDING TUBE  2008   Dr. Watt Climes; only had for a few months  .  HAND SURGERY  1989   crushed thumb, Dr. Fredna Dow  . PROSTATECTOMY  04/2001   removal of prostate cancer, Dr. Janice Norrie  . SPINE SURGERY    . [place]   left arm, Dr. Fredna Dow     reports that he quit smoking about 4 years ago. His smoking use included Cigarettes. He has a 78.00 pack-year smoking history. He has never used smokeless tobacco. He reports that he does not drink alcohol or use drugs.  Allergies  Allergen Reactions  . Lisinopril Swelling    ANGIOEDEMA  . Lamictal [Lamotrigine] Other (See Comments)    Weakness/difficulty swallowing  . Ambien [Zolpidem] Other (See Comments)    Unknown reaction    Family History  Problem Relation Age of Onset  . Heart disease Mother   . Cancer Father        brain cancer and prostate cancer     Prior to Admission medications   Medication Sig Start Date End Date Taking? Authorizing Provider  amoxicillin-clavulanate (AUGMENTIN) 875-125 MG tablet Take 1 tablet by mouth every 12 (twelve) hours. Patient taking differently: Take 1 tablet by mouth every 12 (twelve) hours. Started 08/13 for 10 days 07/31/16  Yes Sheikh, Omair Latif, DO  ARIPiprazole (ABILIFY) 10 MG tablet Take 5 mg by mouth daily.  05/09/14  Yes [provider]  aspirin-acetaminophen-caffeine (EXCEDRIN MIGRAINE) [phone] MG tablet Take 2 tablets by mouth 3 (three) times daily.    Yes [provider]  atorvastatin (LIPITOR) 20 MG tablet Take 1 tablet (20 mg total) by mouth daily at 6 PM. 03/07/16  Yes Belva Crome, MD  budesonide (PULMICORT) 0.25 MG/2ML nebulizer solution Take 4 mLs (0.5 mg total) by nebulization 2 (two) times daily. 12/07/13  Yes Clance, Armando Reichert, MD  cetirizine (ZYRTEC) 10 MG tablet Take 10 mg by mouth daily.   Yes [provider]  cholecalciferol (VITAMIN D) 1000 UNITS tablet Take 1,000 Units by mouth every morning.    Yes [provider]  dabigatran (PRADAXA) 150 MG CAPS capsule Take 1 capsule (150 mg total) by mouth  every 12 (twelve) hours. 03/19/16  Yes Belva Crome, MD  diazepam (VALIUM) 5 MG tablet Take 1 tablet (5 mg total) by mouth every 8 (eight) hours as needed for anxiety or muscle spasms. scheduled Patient taking differently: 5 mg 2 (two) times daily. scheduled 06/29/15  Yes Ollis, Cleaster Corin, NP  diltiazem (CARDIZEM CD) 240 MG 24 hr capsule Take 1 capsule (240 mg total) by mouth daily. 08/05/16 11/03/16 Yes Daune Perch, NP  DiphenhydrAMINE HCl, Sleep, (ZZZQUIL) 25 MG CAPS Take 2 capsules by mouth at bedtime.   Yes [provider]  divalproex (DEPAKOTE) 500 MG 24 hr tablet Take 1,000 mg by mouth at bedtime.    Yes [provider]  docusate sodium (COLACE) 100 MG capsule Take 200 mg by mouth at bedtime.    Yes [provider]  DULoxetine (CYMBALTA) 60 MG capsule Take 120 mg by mouth daily.    Yes [provider]  feeding supplement, ENSURE ENLIVE, (ENSURE ENLIVE) LIQD Take 237 mLs by mouth daily.   Yes [provider]  fluticasone (FLONASE) 50 MCG/ACT nasal spray Place 2 sprays into both nostrils daily as needed for allergies or rhinitis.   Yes [provider]  guaiFENesin (MUCINEX) 600 MG 12 hr tablet Take 1 tablet (600 mg total) by mouth 2 (two) times daily. 07/30/16  Yes Sheikh, Omair Latif, DO  HYDROcodone-acetaminophen (NORCO) 10-325 MG tablet Take 1 tablet by mouth 4 (four) times daily.  01/07/16  Yes [provider]  ipratropium-albuterol (DUONEB) 0.5-2.5 (3) MG/3ML SOLN Take 3 mLs by nebulization every 6 (six) hours. 12/07/13  Yes Clance, Armando Reichert, MD  levofloxacin (LEVAQUIN) 750 MG tablet Take 750 mg by mouth daily. Started 08/08 for 5 days 09/16/16  Yes [provider]  losartan (COZAAR) 50 MG tablet Take 50 mg by mouth at bedtime.    Yes [provider]  Maltodextrin-Xanthan Gum (RESOURCE THICKENUP CLEAR) POWD Take 1 g by mouth as needed. 01/24/16  Yes Eugenie Filler, MD  metoprolol tartrate (LOPRESSOR) 50 MG  tablet Take 1 tablet (50 mg total) by mouth 2 (two) times daily. 08/05/16 11/03/16 Yes Daune Perch, NP  montelukast (SINGULAIR) 10 MG tablet TAKE 1 TABLET AT BEDTIME 07/29/16  Yes [name], MD  nitroGLYCERIN (NITROSTAT) 0.4 MG SL tablet PLACE 1 TABLET (0.4 MG TOTAL) UNDER THE TONGUE EVERY 5 (FIVE) MINUTES AS NEEDED FOR CHEST PAIN. 08/05/16  Yes Daune Perch, NP  omeprazole (PRILOSEC) 40 MG capsule Take 40 mg by mouth 2 (two) times daily.    Yes [provider]  OXYGEN Inhale 2.5 L into the lungs at bedtime.   Yes [provider]  polyethylene glycol (MIRALAX / GLYCOLAX) packet Take 17 g by mouth every evening.    Yes [provider]  predniSONE (DELTASONE) 10 MG tablet TAKE 1 TABLET (10 MG TOTAL) BY MOUTH DAILY WITH BREAKFAST. 09/01/16  Yes [name], MD  primidone (MYSOLINE) 250 MG tablet TAKE 1 TABLET TWICE DAILY 07/29/16  Yes [name], MD  promethazine (PHENERGAN) 25 MG tablet Take 25 mg by mouth every 6 (six) hours as needed for nausea, vomiting or refractory nausea / vomiting.  04/15/16  Yes [provider]  ranitidine (ZANTAC) 150 MG tablet Take 300 mg by mouth 2 (two) times daily.    Yes [provider]  sodium chloride HYPERTONIC 3 % nebulizer solution Take by nebulization 2 (two) times daily. Patient taking differently: Take 2-4 mLs by nebulization daily.  04/03/16  Yes [name], MD  tiZANidine (ZANAFLEX) 4 MG tablet TAKE 1 TABLET BY MOUTH EVERY 8 HOURS AS NEEDED FOR MUSCLE SPASMS. 05/21/16  Yes [name], MD    Physical Exam: Vitals:   09/22/16 1930 09/22/16 2000 09/22/16 2030 09/22/16 2100  BP: 124/70 140/86 123/74 118/71  Pulse: 89 84 83 74  Resp: 15 19 17 15   Temp:      TempSrc:      SpO2: 95% 96% 95% 98%    Constitutional: NAD, calm, comfortable Eyes: PERRL, lids and conjunctivae normal ENMT: Mucous membranes are mildly dry. Posterior pharynx clear of any exudate or lesions. Neck:  normal, supple, no masses, no thyromegaly Respiratory: Decreased  breath sounds on bases, bilateral rhonchi, left lower lobe rales. Cardiovascular: Regular rate and rhythm, no murmurs / rubs / gallops. No extremity edema. 2+ pedal pulses. No carotid bruits.  Abdomen: Soft, no tenderness, no masses palpated. No hepatosplenomegaly. Bowel sounds positive.  Musculoskeletal: no clubbing / cyanosis. Good ROM, no contractures. Normal muscle tone.  Skin: Positive ecchymosis on upper extremities, right > left. Neurologic: CN 2-12 grossly intact. Sensation intact, DTR normal. Strength 5/5 in all 4.  Psychiatric: Somnolent, but arousable. Alert and oriented x 4 with some difficulty.    Labs on Admission: I have personally reviewed following labs and imaging studies  CBC:  Recent Labs Lab 09/22/16 1513  WBC 5.5  NEUTROABS 3.5  HGB 12.8*  HCT 38.4*  MCV 92.5  PLT 119   Basic Metabolic Panel:  Recent Labs Lab 09/22/16 1513  NA 136  K 4.3  CL 98*  CO2 31  GLUCOSE 111*  BUN 16  CREATININE 0.72  CALCIUM 9.0  MG 1.8   GFR: CrCl cannot be calculated (Unknown ideal weight.). Liver Function Tests:  Recent Labs Lab 09/22/16 1513  AST 22  ALT 12*  ALKPHOS 37*  BILITOT 0.4  PROT 6.4*  ALBUMIN 3.6   No results for input(s): LIPASE, AMYLASE in the last 168 hours.  Recent Labs Lab 09/22/16 2005  AMMONIA 66*   Coagulation Profile: No results for input(s): INR, PROTIME in the last 168 hours. Cardiac Enzymes: No results for input(s): CKTOTAL, CKMB, CKMBINDEX, TROPONINI in the last 168 hours. BNP (last 3 results) No results for input(s): PROBNP in the last 8760 hours. HbA1C: No results for input(s): HGBA1C in the last 72 hours. CBG: No results for input(s): GLUCAP in the last 168 hours. Lipid Profile: No results for input(s): CHOL, HDL, LDLCALC, TRIG, CHOLHDL, LDLDIRECT in the last 72 hours. Thyroid Function Tests: No results for input(s): TSH, T4TOTAL, FREET4, T3FREE,  THYROIDAB in the last 72 hours. Anemia Panel: No results for input(s): VITAMINB12, FOLATE, FERRITIN, TIBC, IRON, RETICCTPCT in the last 72 hours. Urine analysis:    Component Value Date/Time   COLORURINE YELLOW 09/22/2016 1622   APPEARANCEUR CLEAR 09/22/2016 1622   LABSPEC 1.013 09/22/2016 1622   PHURINE 7.0 09/22/2016 1622   GLUCOSEU NEGATIVE 09/22/2016 1622   HGBUR NEGATIVE 09/22/2016 1622   BILIRUBINUR NEGATIVE 09/22/2016 1622   KETONESUR NEGATIVE 09/22/2016 1622   PROTEINUR NEGATIVE 09/22/2016 1622   UROBILINOGEN 0.2 10/23/2013 1127   NITRITE NEGATIVE 09/22/2016 1622   LEUKOCYTESUR NEGATIVE 09/22/2016 1622    Radiological Exams on Admission: Dg Chest 2 View  Result Date: 09/22/2016 CLINICAL DATA:  Nausea, weakness, former smoker. EXAM: CHEST  2 VIEW COMPARISON:  Chest x-ray of July 28 2016 FINDINGS: The frontal images are limited due to the patient's clinical condition. The lungs appear well-expanded. There is no focal infiltrate. The heart and pulmonary vascularity are normal. No pleural effusion is evident. The patient has undergone lower thoracic and upper lumbar fusion posteriorly. IMPRESSION: Limited study due to patient's clinical condition. No definite acute cardiopulmonary abnormality. Electronically Signed   By: Abbott  Martinique M.D.   On: 09/22/2016 14:46   Ct Head Wo Contrast  Result Date: 09/22/2016 CLINICAL DATA:  Altered mental status EXAM: CT HEAD WITHOUT CONTRAST TECHNIQUE: Contiguous axial images were obtained from the base of the skull through the vertex without intravenous contrast. COMPARISON:  05/22/2011, MRI 09/07/2015 FINDINGS: Brain: No acute territorial infarction, hemorrhage, or intracranial mass is seen. 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 Mild small vessel  ischemic changes of the bilateral white matter. Mild to moderate atrophy. Old lacunar infarct in the left basal ganglia. Stable ventricle size. Vascular: No hyperdense vessels. Scattered calcifications  at the carotid siphons. Skull: Prior left frontal temp oral craniotomy.  No fracture. Sinuses/Orbits: Mild mucosal thickening in the maxillary and ethmoid sinuses. No acute orbital abnormality. Other: None IMPRESSION: 1. No CT evidence for acute intracranial abnormality. 2. Stable craniotomy changes on the left with evidence of prior left MCA aneurysm clipping Electronically Signed   By: Donavan Foil M.D.   On: 09/22/2016 18:25   Ct Chest Wo Contrast  Result Date: 09/22/2016 CLINICAL DATA:  Weakness history of COPD and lung cancer EXAM: CT CHEST WITHOUT CONTRAST TECHNIQUE: Multidetector CT imaging of the chest was performed following the standard protocol without IV contrast. COMPARISON:  Radiograph 09/22/2016, CT chest 08/11/2016, 04/16/2016 FINDINGS: Cardiovascular: Limited evaluation without the presence of intravenous contrast. Aortic atherosclerosis. Ectatic ascending aorta, measuring up to 3.9 cm. Coronary artery calcification. Normal heart size. No pericardial effusion. Mediastinum/Nodes: Stable subcentimeter mediastinal lymph nodes. No new or enlarging adenopathy. Midline trachea. No thyroid mass. Esophagus within normal limits. Lungs/Pleura: Moderate emphysema. Status post right lower lobe wedge resection with presumed scarring in the right lower lobe and mild bronchiectasis. Minimal bronchiectasis medial left lung base with mild opacity likely scarring, improved aeration since the prior CT. There is no acute pulmonary infiltrate visualized. No pleural effusion or pneumothorax is seen. Upper Abdomen: Stable subcentimeter focus in the right hepatic lobe too small to further characterize. Stable cyst left lobe of the liver. No acute abnormality is seen. Surgical clips in the gallbladder fossa. Musculoskeletal: Right eighth and ninth rib fractures re- demonstrated with some callus about the ninth rib fracture. Posterior stabilization rods and fixating screws at T10 through the upper lumbar spine. Lucent  defect within T9 vertebral body likely due to prior hardware. Osseous fusion T8-T9 with chronic wedging deformity of T8. IMPRESSION: 1. Postsurgical changes in the right lower lobe with scarring present. Minimal consolidation left lower lobe may also be due to scarring. There is overall improved aeration of the left lung base compared to the prior CT. There is no new infiltrate visualized. 2. Moderate emphysema 3. Stable right eighth and ninth rib fractures. Aortic Atherosclerosis (ICD10-I70.0) and Emphysema (ICD10-J43.9). Electronically Signed   By: Donavan Foil M.D.   On: 09/22/2016 20:24    EKG: Independently reviewed Vent. rate 79 BPM PR interval * ms QRS duration 84 ms QT/QTc 391/449 ms P-R-T axes 66 59 56 Sinus rhythm  Assessment/Plan Principal Problem:   Aspiration pneumonia (HCC) Admit to telemetry/inpatient. Aspiration precautions. Continue supplemental oxygen Continue scheduled and as needed bronchodilators. Unasyn 3 g every 6 hours. Follow-up blood cultures and sensitivity. Check a sputum Gram stain, culture and sensitivity. Check strep pneumoniae urinary antigen.  Active Problems:   Hyperammonemia (HCC) Lactulose 20 g by mouth twice a day. Follow-up ammonia level.    Altered mental status Secondary to pneumonia and increased ammonia level. Improved after IV antibiotics and the rest of the treatment.    Hyperlipidemia Hold statin while having hyperammonemia.    Bipolar disorder (HCC) Continue Depakote ER 1000 mg by mouth at bedtime. Continue Cymbalta 120 mg by mouth daily    Essential hypertension Continue Cardizem CD 240 mg by mouth daily. Continue metoprolol 50 mg by mouth twice a day. Continue losartan 50 mg by mouth daily. Monitor blood pressure, renal function and electrolytes.    Chronic lower back pain Continue analgesics as needed.  CAD (coronary artery disease) Continue aspirin and beta blocker. Hold statin while ammonia is elevated.     Primary cancer of right lower lobe of lung ([place]) Continue pneumonia treatment. Patient to follow-up with oncology as scheduled.    COPD (chronic obstructive pulmonary disease) (HCC) Continue aspiration pneumonia treatment. Continue oxygen and bronchodilators.    Paroxysmal atrial fibrillation (HCC) CHA?DS?-VASc Score of at least 3. Continue Cardizem and metoprolol for rate control. Continue parallax.          DVT prophylaxis: On Pradaxa. Code Status: Full code. Family Communication:  Disposition Plan: Admit for IV antibiotics for 2-3 days for aspiration penumonia. Consults called:  Admission status: Inpatient/SDU.   Reubin Milan MD [facility] Pager [phone].  If 7PM-7AM, please contact night-coverage [email or web address] Password [facility]  09/22/2016, 9:57 PM

## 2016-09-22 NOTE — ED Notes (Signed)
Attempted to call report, no answer from nurse.

## 2016-09-22 NOTE — ED Triage Notes (Signed)
Per EMS, pt is coming from home after wife called and reported that pt had an altered mental status. Pt wife reports that pt was alert and oriented last night. EMS reports that pt was AO x3. Pt has a hx of a-fib, COPD, and lung cancer.

## 2016-09-22 NOTE — ED Notes (Signed)
Call report to [facility], RN at 21:20, [phone]

## 2016-09-22 NOTE — ED Notes (Signed)
Bed: NG29 Expected date:  Expected time:  Means of arrival:  Comments: EMS- elderly weakness

## 2016-09-23 ENCOUNTER — Encounter (HOSPITAL_COMMUNITY): Payer: Self-pay

## 2016-09-23 DIAGNOSIS — I480 Paroxysmal atrial fibrillation: Secondary | ICD-10-CM

## 2016-09-23 DIAGNOSIS — F310 Bipolar disorder, current episode hypomanic: Secondary | ICD-10-CM

## 2016-09-23 DIAGNOSIS — J449 Chronic obstructive pulmonary disease, unspecified: Secondary | ICD-10-CM

## 2016-09-23 DIAGNOSIS — I10 Essential (primary) hypertension: Secondary | ICD-10-CM

## 2016-09-23 LAB — STREP PNEUMONIAE URINARY ANTIGEN: STREP PNEUMO URINARY ANTIGEN: NEGATIVE

## 2016-09-23 LAB — CBC WITH DIFFERENTIAL/PLATELET
BASOS PCT: 1 %
Basophils Absolute: 0 10*3/uL (ref 0.0–0.1)
EOS ABS: 0.2 10*3/uL (ref 0.0–0.7)
Eosinophils Relative: 4 %
HCT: 38.5 % — ABNORMAL LOW (ref 39.0–52.0)
HEMOGLOBIN: 12.6 g/dL — AB (ref 13.0–17.0)
Lymphocytes Relative: 32 %
Lymphs Abs: 1.9 10*3/uL (ref 0.7–4.0)
MCH: 30.2 pg (ref 26.0–34.0)
MCHC: 32.7 g/dL (ref 30.0–36.0)
MCV: 92.3 fL (ref 78.0–100.0)
Monocytes Absolute: 0.6 10*3/uL (ref 0.1–1.0)
Monocytes Relative: 9 %
NEUTROS PCT: 54 %
Neutro Abs: 3.4 10*3/uL (ref 1.7–7.7)
Platelets: 198 10*3/uL (ref 150–400)
RBC: 4.17 MIL/uL — AB (ref 4.22–5.81)
RDW: 14.7 % (ref 11.5–15.5)
WBC: 6.1 10*3/uL (ref 4.0–10.5)

## 2016-09-23 LAB — BASIC METABOLIC PANEL
Anion gap: 5 (ref 5–15)
BUN: 12 mg/dL (ref 6–20)
CHLORIDE: 103 mmol/L (ref 101–111)
CO2: 32 mmol/L (ref 22–32)
CREATININE: 0.64 mg/dL (ref 0.61–1.24)
Calcium: 8.4 mg/dL — ABNORMAL LOW (ref 8.9–10.3)
GFR calc non Af Amer: >60 mL/min (ref >60)
Glucose, Bld: 81 mg/dL (ref 65–99)
POTASSIUM: 4.1 mmol/L (ref 3.5–5.1)
SODIUM: 140 mmol/L (ref 135–145)

## 2016-09-23 LAB — AMMONIA: AMMONIA: 24 umol/L (ref 9–35)

## 2016-09-23 MED ORDER — DIAZEPAM 5 MG PO TABS: 5.0000 mg | ORAL_TABLET | Freq: Two times a day (BID) | ORAL | Status: DC | PRN

## 2016-09-23 NOTE — Consult Note (Signed)
Pt was seen today to follow up on  His condition. He is an active patient with [facility] a pallitive home based program provided by [facility].  He is doing much better and we will continue to follow once he gets home. [facility] nurse liaison   [phone]

## 2016-09-23 NOTE — Consult Note (Signed)
   [facility] Inpatient Consult   09/23/2016  Malik Zuniga Sep 08, 1945 [id number]    Patient screened for potential [facility] Care Management services.  Chart reviewed. [facility] Care Management not appropriate because [name] is active with [facility], outpatient home based palliative care program administered by [facility].   Spoke with inpatient RNCM about above.   Marthenia Rolling, MSN-Ed, RN,BSN [facility] Liaison [phone]

## 2016-09-23 NOTE — Progress Notes (Addendum)
PROGRESS NOTE    Malik Zuniga  QIW:[id number] DOB: [birth year] DOA: 09/22/2016 PCP: [name], MD  Brief Narrative:  Malik Zuniga is a 71 y.o. male with medical history significant of anemia, angioedema, aspiration pneumonia, anxiety, asthma/COPD, history of bacteremia due to Pseudomonas, bipolar affective disorder, chronic back pain, CAD, history of MI, lung cancer, chronic atrial fibrillation who was brought to the emergency department via EMS due to altered mental status since early this morning   Assessment & Plan:  Metabolic encephalopathy  -Suspect secondary to polypharmacy, hyperammonemia, unable to rule out aspiration pneumonia at this time, he definitely has increased productive cough however CT/x-ray without clear evidence of new infiltrate or consolidation  -improving -Cut down Valium to BID PRN, stop Zanaflex and Benadryl -For elevated ammonia, continue lactulose today, no history of liver disease,, could be related to Depakote use -Continue IV Unasyn, transitioned to oral Augmentin in 24-48 hours -SLP evaluation -Follow blood cultures  History of dysphagia and recurrent aspiration pneumonia -Antibiotics as above -SLP evaluation, resume dysphagia 3 diet with honey thick liquids    Bipolar disorder (HCC) -On high-dose Depakote, Cymbalta and Abilify for this    Essential hypertension -Continue Cardizem, metoprolol, losartan    Chronic lower back pain -Due to polypharmacy/oversedation will stop Zanaflex, continue Vicodin every 6 PRN    CAD (coronary artery disease) -stable, ASA/BB    Primary cancer of right lower lobe of lung ([place]) -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 Continue Cardizem and metoprolol for rate control. Continue pradaxa    DVT prophylaxis: On Pradaxa. Code Status: Full code. Family Communication: wife at bedside Disposition Plan:Home in 1-2days  Consultants:     Antimicrobials:   Unasyn   Subjective: Coughing but breathing ok, mentation back to baseline  Objective: Vitals:   09/22/16 2322 09/23/16 0545 09/23/16 0908 09/23/16 1227  BP:  130/67    Pulse:  88 87   Resp:  20 20   Temp:  98 F (36.7 C)    TempSrc:  Oral    SpO2: 96% 93% 97% 95%  Weight:      Height:        Intake/Output Summary (Last 24 hours) at 09/23/16 1331 Last data filed at 09/23/16 0600  Gross per 24 hour  Intake          1031.25 ml  Output              750 ml  Net           281.25 ml   Filed Weights   09/22/16 2206  Weight: 78.8 kg (173 lb 11.2 oz)    Examination:  General exam: Appears calm and comfortable, Chronically ill-appearing male Respiratory system: Few rhonchi at bases, rest clear Cardiovascular system: S1 & S2 heard, RRR.  Gastrointestinal system: Abdomen is nondistended, soft and nontender.Normal bowel sounds heard. Central nervous system: Alert and oriented. No focal neurological deficits. Extremities: Symmetric 5 x 5 power. Skin: No rashes, lesions or ulcers Psychiatry: Judgement and insight appear normal. Mood & affect appropriate.     Data Reviewed:   CBC:  Recent Labs Lab 09/22/16 1513 09/23/16 0431  WBC 5.5 6.1  NEUTROABS 3.5 3.4  HGB 12.8* 12.6*  HCT  38.4* 38.5*  MCV 92.5 92.3  PLT 220 417   Basic Metabolic Panel:  Recent Labs Lab 09/22/16 1513 09/23/16 0431  NA 136 140  K 4.3 4.1  CL 98* 103  CO2 31 32  GLUCOSE 111* 81  BUN 16 12  CREATININE 0.72 0.64  CALCIUM 9.0 8.4*  MG 1.8  --    GFR: Estimated Creatinine Clearance: 87.4 mL/min (by C-G formula based on SCr of 0.64 mg/dL). Liver Function Tests:  Recent Labs Lab 09/22/16 1513  AST 22  ALT 12*  ALKPHOS 37*  BILITOT  0.4  PROT 6.4*  ALBUMIN 3.6   No results for input(s): LIPASE, AMYLASE in the last 168 hours.  Recent Labs Lab 09/22/16 2005 09/23/16 0532  AMMONIA 66* 24   Coagulation Profile: No results for input(s): INR, PROTIME in the last 168 hours. Cardiac Enzymes: No results for input(s): CKTOTAL, CKMB, CKMBINDEX, TROPONINI in the last 168 hours. BNP (last 3 results) No results for input(s): PROBNP in the last 8760 hours. HbA1C: No results for input(s): HGBA1C in the last 72 hours. CBG: No results for input(s): GLUCAP in the last 168 hours. Lipid Profile: No results for input(s): CHOL, HDL, LDLCALC, TRIG, CHOLHDL, LDLDIRECT in the last 72 hours. Thyroid Function Tests: No results for input(s): TSH, T4TOTAL, FREET4, T3FREE, THYROIDAB in the last 72 hours. Anemia Panel: No results for input(s): VITAMINB12, FOLATE, FERRITIN, TIBC, IRON, RETICCTPCT in the last 72 hours. Urine analysis:    Component Value Date/Time   COLORURINE YELLOW 09/22/2016 1622   APPEARANCEUR CLEAR 09/22/2016 1622   LABSPEC 1.013 09/22/2016 1622   PHURINE 7.0 09/22/2016 1622   GLUCOSEU NEGATIVE 09/22/2016 1622   HGBUR NEGATIVE 09/22/2016 1622   BILIRUBINUR NEGATIVE 09/22/2016 1622   KETONESUR NEGATIVE 09/22/2016 1622   PROTEINUR NEGATIVE 09/22/2016 1622   UROBILINOGEN 0.2 10/23/2013 1127   NITRITE NEGATIVE 09/22/2016 1622   LEUKOCYTESUR NEGATIVE 09/22/2016 1622   Sepsis Labs: @LABRCNTIP (procalcitonin:4,lacticidven:4)  ) Recent Results (from the past 240 hour(s))  Blood Culture (routine x 2)     Status: None (Preliminary result)   Collection Time: 09/22/16  3:13 PM  Result Value Ref Range Status   Specimen Description BLOOD LEFT HAND  Final   Special Requests   Final    BOTTLES DRAWN AEROBIC AND ANAEROBIC Blood Culture adequate volume   Culture   Final    NO GROWTH < 24 HOURS Performed at [facility], [place] [address], [place], [place] [address]    Report Status PENDING  Incomplete  Blood  Culture (routine x 2)     Status: None (Preliminary result)   Collection Time: 09/22/16  3:23 PM  Result Value Ref Range Status   Specimen Description BLOOD LEFT HAND  Final   Special Requests   Final    BOTTLES DRAWN AEROBIC AND ANAEROBIC Blood Culture adequate volume   Culture   Final    NO GROWTH < 24 HOURS Performed at [facility], [place] [address], [place], [place] [address]    Report Status PENDING  Incomplete         Radiology Studies: Dg Chest 2 View  Result Date: 09/22/2016 CLINICAL DATA:  Nausea, weakness, former smoker. EXAM: CHEST  2 VIEW COMPARISON:  Chest x-ray of July 28 2016 FINDINGS: The frontal images are limited due to the patient's clinical condition. The lungs appear well-expanded. There is no focal infiltrate. The heart and pulmonary vascularity are normal. No pleural effusion is evident. The patient has undergone  lower thoracic and upper lumbar fusion posteriorly. IMPRESSION: Limited study due to patient's clinical condition. No definite acute cardiopulmonary abnormality. Electronically Signed   By: Rey  Martinique M.D.   On: 09/22/2016 14:46   Ct Head Wo Contrast  Result Date: 09/22/2016 CLINICAL DATA:  Altered mental status EXAM: CT HEAD WITHOUT CONTRAST TECHNIQUE: Contiguous axial images were obtained from the base of the skull through the vertex without intravenous contrast. COMPARISON:  05/22/2011, MRI 09/07/2015 FINDINGS: Brain: No acute territorial infarction, hemorrhage, or intracranial mass is seen. 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 Mild small vessel ischemic changes of the bilateral white matter. Mild to moderate atrophy. Old lacunar infarct in the left basal ganglia. Stable ventricle size. Vascular: No hyperdense vessels. Scattered calcifications at the carotid siphons. Skull: Prior left frontal temp oral craniotomy.  No fracture. Sinuses/Orbits: Mild mucosal thickening in the maxillary and ethmoid sinuses. No acute orbital abnormality. Other: None  IMPRESSION: 1. No CT evidence for acute intracranial abnormality. 2. Stable craniotomy changes on the left with evidence of prior left MCA aneurysm clipping Electronically Signed   By: Donavan Foil M.D.   On: 09/22/2016 18:25   Ct Chest Wo Contrast  Result Date: 09/22/2016 CLINICAL DATA:  Weakness history of COPD and lung cancer EXAM: CT CHEST WITHOUT CONTRAST TECHNIQUE: Multidetector CT imaging of the chest was performed following the standard protocol without IV contrast. COMPARISON:  Radiograph 09/22/2016, CT chest 08/11/2016, 04/16/2016 FINDINGS: Cardiovascular: Limited evaluation without the presence of intravenous contrast. Aortic atherosclerosis. Ectatic ascending aorta, measuring up to 3.9 cm. Coronary artery calcification. Normal heart size. No pericardial effusion. Mediastinum/Nodes: Stable subcentimeter mediastinal lymph nodes. No new or enlarging adenopathy. Midline trachea. No thyroid mass. Esophagus within normal limits. Lungs/Pleura: Moderate emphysema. Status post right lower lobe wedge resection with presumed scarring in the right lower lobe and mild bronchiectasis. Minimal bronchiectasis medial left lung base with mild opacity likely scarring, improved aeration since the prior CT. There is no acute pulmonary infiltrate visualized. No pleural effusion or pneumothorax is seen. Upper Abdomen: Stable subcentimeter focus in the right hepatic lobe too small to further characterize. Stable cyst left lobe of the liver. No acute abnormality is seen. Surgical clips in the gallbladder fossa. Musculoskeletal: Right eighth and ninth rib fractures re- demonstrated with some callus about the ninth rib fracture. Posterior stabilization rods and fixating screws at T10 through the upper lumbar spine. Lucent defect within T9 vertebral body likely due to prior hardware. Osseous fusion T8-T9 with chronic wedging deformity of T8. IMPRESSION: 1. Postsurgical changes in the right lower lobe with scarring present.  Minimal consolidation left lower lobe may also be due to scarring. There is overall improved aeration of the left lung base compared to the prior CT. There is no new infiltrate visualized. 2. Moderate emphysema 3. Stable right eighth and ninth rib fractures. Aortic Atherosclerosis (ICD10-I70.0) and Emphysema (ICD10-J43.9). Electronically Signed   By: Donavan Foil M.D.   On: 09/22/2016 20:24        Scheduled Meds: . ARIPiprazole  5 mg Oral Daily  . budesonide (PULMICORT) nebulizer solution  0.5 mg Nebulization BID  . cholecalciferol  1,000 Units Oral q morning - 10a  . dabigatran  150 mg Oral Q12H  . diltiazem  240 mg Oral Daily  . divalproex  1,000 mg Oral QHS  . docusate sodium  200 mg Oral QHS  . DULoxetine  120 mg Oral Daily  . famotidine  20 mg Oral BID  . feeding supplement (ENSURE ENLIVE)  237  mL Oral Daily  . guaiFENesin  600 mg Oral BID  . ipratropium-albuterol  3 mL Nebulization QID  . lactulose  20 g Oral BID  . loratadine  10 mg Oral Daily  . losartan  50 mg Oral QHS  . metoprolol tartrate  50 mg Oral BID  . montelukast  10 mg Oral QHS  . pantoprazole  40 mg Oral Daily  . polyethylene glycol  17 g Oral QPM  . predniSONE  10 mg Oral Q breakfast  . primidone  250 mg Oral BID   Continuous Infusions: . sodium chloride 75 mL/hr at 09/22/16 2343  . ampicillin-sulbactam (UNASYN) IV 3 g (09/23/16 1021)     LOS: 1 day    Time spent: 27min    Domenic Polite, MD [facility] Pager [phone]  If 7PM-7AM, please contact night-coverage [email or web address] Password [email or web address] 09/23/2016, 1:31 PM

## 2016-09-24 DIAGNOSIS — J690 Pneumonitis due to inhalation of food and vomit: Principal | ICD-10-CM

## 2016-09-24 DIAGNOSIS — R4182 Altered mental status, unspecified: Secondary | ICD-10-CM

## 2016-09-24 LAB — CBC WITH DIFFERENTIAL/PLATELET
BASOS ABS: 0 10*3/uL (ref 0.0–0.1)
BASOS PCT: 0 %
EOS PCT: 3 %
Eosinophils Absolute: 0.2 10*3/uL (ref 0.0–0.7)
HCT: 36.9 % — ABNORMAL LOW (ref 39.0–52.0)
Hemoglobin: 12.3 g/dL — ABNORMAL LOW (ref 13.0–17.0)
Lymphocytes Relative: 36 %
Lymphs Abs: 1.9 10*3/uL (ref 0.7–4.0)
MCH: 30.7 pg (ref 26.0–34.0)
MCHC: 33.3 g/dL (ref 30.0–36.0)
MCV: 92 fL (ref 78.0–100.0)
MONO ABS: 0.5 10*3/uL (ref 0.1–1.0)
Monocytes Relative: 10 %
Neutro Abs: 2.7 10*3/uL (ref 1.7–7.7)
Neutrophils Relative %: 51 %
PLATELETS: 185 10*3/uL (ref 150–400)
RBC: 4.01 MIL/uL — ABNORMAL LOW (ref 4.22–5.81)
RDW: 14.5 % (ref 11.5–15.5)
WBC: 5.4 10*3/uL (ref 4.0–10.5)

## 2016-09-24 LAB — BASIC METABOLIC PANEL
Anion gap: 6 (ref 5–15)
BUN: 11 mg/dL (ref 6–20)
CHLORIDE: 100 mmol/L — AB (ref 101–111)
CO2: 31 mmol/L (ref 22–32)
Calcium: 8.5 mg/dL — ABNORMAL LOW (ref 8.9–10.3)
Creatinine, Ser: 0.69 mg/dL (ref 0.61–1.24)
GFR calc non Af Amer: >60 mL/min (ref >60)
Glucose, Bld: 92 mg/dL (ref 65–99)
POTASSIUM: 4.3 mmol/L (ref 3.5–5.1)
SODIUM: 137 mmol/L (ref 135–145)

## 2016-09-24 LAB — EXPECTORATED SPUTUM ASSESSMENT W REFEX TO RESP CULTURE

## 2016-09-24 LAB — EXPECTORATED SPUTUM ASSESSMENT W GRAM STAIN, RFLX TO RESP C

## 2016-09-24 MED ORDER — AMOXICILLIN-POT CLAVULANATE 875-125 MG PO TABS: 1.0000 | ORAL_TABLET | Freq: Two times a day (BID) | ORAL | 0 refills | Status: DC

## 2016-09-24 MED ORDER — HYDROCODONE-ACETAMINOPHEN 10-325 MG PO TABS: 1.0000 | ORAL_TABLET | Freq: Four times a day (QID) | ORAL | 0 refills | Status: DC

## 2016-09-24 MED ORDER — DIAZEPAM 5 MG PO TABS: 5.0000 mg | ORAL_TABLET | Freq: Two times a day (BID) | ORAL | 0 refills | Status: AC | PRN

## 2016-09-24 MED ORDER — LACTULOSE 10 GM/15ML PO SOLN: 20.0000 g | Freq: Every day | ORAL | 0 refills | Status: DC

## 2016-09-24 MED ORDER — IPRATROPIUM-ALBUTEROL 0.5-2.5 (3) MG/3ML IN SOLN: 3.0000 mL | Freq: Three times a day (TID) | RESPIRATORY_TRACT | Status: DC

## 2016-09-24 NOTE — Discharge Summary (Signed)
Physician Discharge Summary  Malik Zuniga KPT:[id number] DOB: 09/12/1945 DOA: 09/22/2016  PCP: Malik Minium, MD  Admit date: 09/22/2016 Discharge date: 09/24/2016  Admitted From: Home  Disposition:  Home with hospice care  Recommendations for Outpatient Follow-up:  1. Follow up with PCP in 1-2 weeks 2. Please obtain BMP/CBC in one week     Discharge Condition: stable.  CODE STATUS: full code.  Diet recommendation: Dysphagia 3 diet   Brief/Interim Summary: Malik Zuniga a 71 y.o.malewith medical history significant of anemia, angioedema, aspiration pneumonia, anxiety, asthma/COPD, history of bacteremia due to Pseudomonas, bipolar affective disorder, chronic back pain, CAD, history of MI, lung cancer, chronic atrial fibrillation who was brought to the emergency department via EMS due to altered mental status since early this morning   Assessment & Plan:  Metabolic encephalopathy  -Suspect secondary to polypharmacy, hyperammonemia, unable to rule out aspiration pneumonia at this time, he definitely has increased productive cough however CT/x-ray without clear evidence of new infiltrate or consolidation  -improved.  -Cut down Valium to BID PRN, stop Zanaflex.  -For elevated ammonia, continue lactulose daily , no history of liver disease,, could be related to Depakote use -Continue IV Unasyn, transitioned to oral Augmentin today. He is feeling better, he wants to go home.  -Follow blood cultures  History of dysphagia and recurrent aspiration pneumonia -Antibiotics as above -SLP evaluation, resume dysphagia 3 diet with honey thick liquids he decline speech evaluation.   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 Continue Cardizem and metoprolol for rate control. Continue pradaxa   Discharge Diagnoses:  Principal Problem:   Aspiration pneumonia (Malik Zuniga) Active Problems:   Hyperlipidemia   Bipolar disorder (Malik Zuniga)   Essential hypertension   Chronic lower back pain   CAD (coronary artery disease)   Primary cancer of right lower lobe of lung (HCC)   COPD (chronic obstructive pulmonary disease) (HCC)   Paroxysmal atrial fibrillation (HCC)   Altered mental status   Hyperammonemia (Malik Zuniga)    Discharge Instructions  Discharge Instructions    Increase activity slowly    Complete by:  As directed      Allergies as of 09/24/2016      Reactions   Lisinopril Swelling   ANGIOEDEMA   Lamictal [lamotrigine] Other (See Comments)   Weakness/difficulty swallowing   Ambien [zolpidem] Other (See Comments)   Unknown reaction      Medication List    STOP taking these medications   levofloxacin 750 MG tablet Commonly known as:  LEVAQUIN   promethazine 25 MG tablet Commonly known as:  PHENERGAN   tiZANidine 4 MG tablet Commonly known as:  ZANAFLEX   ZZZQUIL 25 MG Caps Generic drug:  DiphenhydrAMINE HCl (Sleep)     TAKE these medications   amoxicillin-clavulanate 875-125 MG tablet Commonly known as:  AUGMENTIN Take 1 tablet by mouth every 12 (twelve) hours. What changed:  additional instructions   ARIPiprazole 10 MG tablet Commonly known  as:  ABILIFY Take 5 mg by mouth daily.   aspirin-acetaminophen-caffeine 250-250-65 MG tablet Commonly known as:  EXCEDRIN  MIGRAINE Take 2 tablets by mouth 3 (three) times daily.   atorvastatin 20 MG tablet Commonly known as:  LIPITOR Take 1 tablet (20 mg total) by mouth daily at 6 PM.   budesonide 0.25 MG/2ML nebulizer solution Commonly known as:  PULMICORT Take 4 mLs (0.5 mg total) by nebulization 2 (two) times daily.   cetirizine 10 MG tablet Commonly known as:  ZYRTEC Take 10 mg by mouth daily.   cholecalciferol 1000 units tablet Commonly known as:  VITAMIN D Take 1,000 Units by mouth every morning.   dabigatran 150 MG Caps capsule Commonly known as:  PRADAXA Take 1 capsule (150 mg total) by mouth every 12 (twelve) hours.   diazepam 5 MG tablet Commonly known as:  VALIUM Take 1 tablet (5 mg total) by mouth every 12 (twelve) hours as needed for anxiety or muscle spasms. scheduled What changed:  when to take this   diltiazem 240 MG 24 hr capsule Commonly known as:  CARDIZEM CD Take 1 capsule (240 mg total) by mouth daily.   divalproex 500 MG 24 hr tablet Commonly known as:  DEPAKOTE ER Take 1,000 mg by mouth at bedtime.   docusate sodium 100 MG capsule Commonly known as:  COLACE Take 200 mg by mouth at bedtime.   DULoxetine 60 MG capsule Commonly known as:  CYMBALTA Take 120 mg by mouth daily.   feeding supplement (ENSURE ENLIVE) Liqd Take 237 mLs by mouth daily.   fluticasone 50 MCG/ACT nasal spray Commonly known as:  FLONASE Place 2 sprays into both nostrils daily as needed for allergies or rhinitis.   guaiFENesin 600 MG 12 hr tablet Commonly known as:  MUCINEX Take 1 tablet (600 mg total) by mouth 2 (two) times daily.   HYDROcodone-acetaminophen 10-325 MG tablet Commonly known as:  NORCO Take 1 tablet by mouth 4 (four) times daily.   ipratropium-albuterol 0.5-2.5 (3) MG/3ML Soln Commonly known as:  DUONEB Take 3 mLs by nebulization every 6 (six) hours.   lactulose 10 GM/15ML solution Commonly known as:  CHRONULAC Take 30 mLs (20 g total) by mouth daily.   losartan  50 MG tablet Commonly known as:  COZAAR Take 50 mg by mouth at bedtime.   metoprolol tartrate 50 MG tablet Commonly known as:  LOPRESSOR Take 1 tablet (50 mg total) by mouth 2 (two) times daily.   montelukast 10 MG tablet Commonly known as:  SINGULAIR TAKE 1 TABLET AT BEDTIME   nitroGLYCERIN 0.4 MG SL tablet Commonly known as:  NITROSTAT PLACE 1 TABLET (0.4 MG TOTAL) UNDER THE TONGUE EVERY 5 (FIVE) MINUTES AS NEEDED FOR CHEST PAIN.   omeprazole 40 MG capsule Commonly known as:  PRILOSEC Take 40 mg by mouth 2 (two) times daily.   OXYGEN Inhale 2.5 L into the lungs at bedtime.   polyethylene glycol packet Commonly known as:  MIRALAX / GLYCOLAX Take 17 g by mouth every evening.   predniSONE 10 MG tablet Commonly known as:  DELTASONE TAKE 1 TABLET (10 MG TOTAL) BY MOUTH DAILY WITH BREAKFAST.   primidone 250 MG tablet Commonly known as:  MYSOLINE TAKE 1 TABLET TWICE DAILY   ranitidine 150 MG tablet Commonly known as:  ZANTAC Take 300 mg by mouth 2 (two) times daily.   RESOURCE THICKENUP CLEAR Powd Take 1 g by mouth as needed.   sodium chloride HYPERTONIC 3 % nebulizer solution Take  by nebulization 2 (two) times daily. What changed:  how much to take  when to take this       Allergies  Allergen Reactions  . Lisinopril Swelling    ANGIOEDEMA  . Lamictal [Lamotrigine] Other (See Comments)    Weakness/difficulty swallowing  . Ambien [Zolpidem] Other (See Comments)    Unknown reaction    Consultations: none  Procedures/Studies: Dg Chest 2 View  Result Date: 09/22/2016 CLINICAL DATA:  Nausea, weakness, former smoker. EXAM: CHEST  2 VIEW COMPARISON:  Chest x-ray of July 28 2016 FINDINGS: The frontal images are limited due to the patient's clinical condition. The lungs appear well-expanded. There is no focal infiltrate. The heart and pulmonary vascularity are normal. No pleural effusion is evident. The patient has undergone lower thoracic and upper lumbar  fusion posteriorly. IMPRESSION: Limited study due to patient's clinical condition. No definite acute cardiopulmonary abnormality. Electronically Signed   By: Jailin  Martinique M.D.   On: 09/22/2016 14:46   Ct Head Wo Contrast  Result Date: 09/22/2016 CLINICAL DATA:  Altered mental status EXAM: CT HEAD WITHOUT CONTRAST TECHNIQUE: Contiguous axial images were obtained from the base of the skull through the vertex without intravenous contrast. COMPARISON:  05/22/2011, MRI 09/07/2015 FINDINGS: Brain: No acute territorial infarction, hemorrhage, or intracranial mass is seen. 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 Mild small vessel ischemic changes of the bilateral white matter. Mild to moderate atrophy. Old lacunar infarct in the left basal ganglia. Stable ventricle size. Vascular: No hyperdense vessels. Scattered calcifications at the carotid siphons. Skull: Prior left frontal temp oral craniotomy.  No fracture. Sinuses/Orbits: Mild mucosal thickening in the maxillary and ethmoid sinuses. No acute orbital abnormality. Other: None IMPRESSION: 1. No CT evidence for acute intracranial abnormality. 2. Stable craniotomy changes on the left with evidence of prior left MCA aneurysm clipping Electronically Signed   By: Donavan Foil M.D.   On: 09/22/2016 18:25   Ct Chest Wo Contrast  Result Date: 09/22/2016 CLINICAL DATA:  Weakness history of COPD and lung cancer EXAM: CT CHEST WITHOUT CONTRAST TECHNIQUE: Multidetector CT imaging of the chest was performed following the standard protocol without IV contrast. COMPARISON:  Radiograph 09/22/2016, CT chest 08/11/2016, 04/16/2016 FINDINGS: Cardiovascular: Limited evaluation without the presence of intravenous contrast. Aortic atherosclerosis. Ectatic ascending aorta, measuring up to 3.9 cm. Coronary artery calcification. Normal heart size. No pericardial effusion. Mediastinum/Nodes: Stable subcentimeter mediastinal lymph nodes. No new or enlarging adenopathy. Midline trachea.  No thyroid mass. Esophagus within normal limits. Lungs/Pleura: Moderate emphysema. Status post right lower lobe wedge resection with presumed scarring in the right lower lobe and mild bronchiectasis. Minimal bronchiectasis medial left lung base with mild opacity likely scarring, improved aeration since the prior CT. There is no acute pulmonary infiltrate visualized. No pleural effusion or pneumothorax is seen. Upper Abdomen: Stable subcentimeter focus in the right hepatic lobe too small to further characterize. Stable cyst left lobe of the liver. No acute abnormality is seen. Surgical clips in the gallbladder fossa. Musculoskeletal: Right eighth and ninth rib fractures re- demonstrated with some callus about the ninth rib fracture. Posterior stabilization rods and fixating screws at T10 through the upper lumbar spine. Lucent defect within T9 vertebral body likely due to prior hardware. Osseous fusion T8-T9 with chronic wedging deformity of T8. IMPRESSION: 1. Postsurgical changes in the right lower lobe with scarring present. Minimal consolidation left lower lobe may also be due to scarring. There is overall improved aeration of the left lung base compared to the prior CT.  There is no new infiltrate visualized. 2. Moderate emphysema 3. Stable right eighth and ninth rib fractures. Aortic Atherosclerosis (ICD10-I70.0) and Emphysema (ICD10-J43.9). Electronically Signed   By: Donavan Foil M.D.   On: 09/22/2016 20:24       Subjective: He sleep all morning.  He wake up, answer questions appropriately . He wants to go home.  He and his wife decline PT evaluation.   Discharge Exam: Vitals:   09/24/16 0450 09/24/16 0805  BP: (!) 147/66   Pulse: 82   Resp: 18   Temp: 98 F (36.7 C)   SpO2: 93% 96%   Vitals:   09/23/16 2039 09/23/16 2244 09/24/16 0450 09/24/16 0805  BP: (!) 141/76  (!) 147/66   Pulse: 90  82   Resp: 18  18   Temp: 97.8 F (36.6 C)  98 F (36.7 C)   TempSrc: Oral  Oral   SpO2: 96%  96% 93% 96%  Weight:      Height:        General: Pt is alert, awake, not in acute distress Cardiovascular: RRR, S1/S2 +, no rubs, no gallops Respiratory: CTA bilaterally, no wheezing, no rhonchi Abdominal: Soft, NT, ND, bowel sounds + Extremities: no edema, no cyanosis    The results of significant diagnostics from this hospitalization (including imaging, microbiology, ancillary and laboratory) are listed below for reference.     Microbiology: Recent Results (from the past 240 hour(s))  Blood Culture (routine x 2)     Status: None (Preliminary result)   Collection Time: 09/22/16  3:13 PM  Result Value Ref Range Status   Specimen Description BLOOD LEFT HAND  Final   Special Requests   Final    BOTTLES DRAWN AEROBIC AND ANAEROBIC Blood Culture adequate volume   Culture   Final    NO GROWTH < 24 HOURS Performed at [facility], [address] [place], [place] [address]    Report Status PENDING  Incomplete  Blood Culture (routine x 2)     Status: None (Preliminary result)   Collection Time: 09/22/16  3:23 PM  Result Value Ref Range Status   Specimen Description BLOOD LEFT HAND  Final   Special Requests   Final    BOTTLES DRAWN AEROBIC AND ANAEROBIC Blood Culture adequate volume   Culture   Final    NO GROWTH < 24 HOURS Performed at [facility], [place] [address], [place], [place] [address]    Report Status PENDING  Incomplete  Culture, sputum-assessment     Status: None   Collection Time: 09/24/16  3:00 AM  Result Value Ref Range Status   Specimen Description SPUTUM  Final   Special Requests NONE  Final   Sputum evaluation THIS SPECIMEN IS ACCEPTABLE FOR SPUTUM CULTURE  Final   Report Status 09/24/2016 FINAL  Final     Labs: BNP (last 3 results)  Recent Labs  12/18/15 1149 01/21/16 1601  BNP 38.6 82.9   Basic Metabolic Panel:  Recent Labs Lab 09/22/16 1513 09/23/16 0431 09/24/16 0432  NA 136 140 137  K 4.3 4.1 4.3  CL 98* 103 100*  CO2 31 32  31  GLUCOSE 111* 81 92  BUN 16 12 11   CREATININE 0.72 0.64 0.69  CALCIUM 9.0 8.4* 8.5*  MG 1.8  --   --    Liver Function Tests:  Recent Labs Lab 09/22/16 1513  AST 22  ALT 12*  ALKPHOS 37*  BILITOT 0.4  PROT 6.4*  ALBUMIN 3.6   No results for input(s): LIPASE, AMYLASE in the last 168 hours.  Recent Labs Lab 09/22/16 2005 09/23/16 0532  AMMONIA 66* 24   CBC:  Recent Labs Lab 09/22/16 1513 09/23/16 0431 09/24/16 0805  WBC 5.5 6.1 5.4  NEUTROABS 3.5 3.4 2.7  HGB 12.8* 12.6* 12.3*  HCT 38.4* 38.5* 36.9*  MCV 92.5 92.3 92.0  PLT 220 198 185   Cardiac Enzymes: No results for input(s): CKTOTAL, CKMB, CKMBINDEX, TROPONINI in the last 168 hours. BNP: Invalid input(s): POCBNP CBG: No results for input(s): GLUCAP in the last 168 hours. D-Dimer No results for input(s): DDIMER in the last 72 hours. Hgb A1c No results for input(s): HGBA1C in the last 72 hours. Lipid Profile No results for input(s): CHOL, HDL, LDLCALC, TRIG, CHOLHDL, LDLDIRECT in the last 72 hours. Thyroid function studies No results for input(s): TSH, T4TOTAL, T3FREE, THYROIDAB in the last 72 hours.  Invalid input(s): FREET3 Anemia work up No results for input(s): VITAMINB12, FOLATE, FERRITIN, TIBC, IRON, RETICCTPCT in the last 72 hours. Urinalysis    Component Value Date/Time   COLORURINE YELLOW 09/22/2016 1622   APPEARANCEUR CLEAR 09/22/2016 1622   LABSPEC 1.013 09/22/2016 1622   PHURINE 7.0 09/22/2016 1622   GLUCOSEU NEGATIVE 09/22/2016 1622   HGBUR NEGATIVE 09/22/2016 1622   BILIRUBINUR NEGATIVE 09/22/2016 1622   KETONESUR NEGATIVE 09/22/2016 1622   PROTEINUR NEGATIVE 09/22/2016 1622   UROBILINOGEN 0.2 10/23/2013 1127   NITRITE NEGATIVE 09/22/2016 1622   LEUKOCYTESUR NEGATIVE 09/22/2016 1622   Sepsis Labs Invalid input(s): PROCALCITONIN,  WBC,  LACTICIDVEN Microbiology Recent Results (from the past 240 hour(s))  Blood Culture (routine x 2)     Status: None (Preliminary result)    Collection Time: 09/22/16  3:13 PM  Result Value Ref Range Status   Specimen Description BLOOD LEFT HAND  Final   Special Requests   Final    BOTTLES DRAWN AEROBIC AND ANAEROBIC Blood Culture adequate volume   Culture   Final    NO GROWTH < 24 HOURS Performed at [facility], [place] [address] [place], [place] [address]    Report Status PENDING  Incomplete  Blood Culture (routine x 2)     Status: None (Preliminary result)   Collection Time: 09/22/16  3:23 PM  Result Value Ref Range Status   Specimen Description BLOOD LEFT HAND  Final   Special Requests   Final    BOTTLES DRAWN AEROBIC AND ANAEROBIC Blood Culture adequate volume   Culture   Final    NO GROWTH < 24 HOURS Performed at [facility], [place] [address], [place], [place] [address]    Report Status PENDING  Incomplete  Culture, sputum-assessment     Status: None   Collection Time: 09/24/16  3:00 AM  Result Value Ref Range Status   Specimen Description SPUTUM  Final   Special Requests NONE  Final   Sputum evaluation THIS SPECIMEN IS ACCEPTABLE FOR SPUTUM CULTURE  Final   Report Status 09/24/2016 FINAL  Final     Time coordinating discharge: Over 30 minutes  SIGNED:   Elmarie Shiley, MD  [facility] 09/24/2016, 11:16 AM Pager   If 7PM-7AM, please contact night-coverage [email or web address] Password [email or web address]

## 2016-09-25 ENCOUNTER — Telehealth: Payer: Self-pay

## 2016-09-25 NOTE — Telephone Encounter (Signed)
Spoke with patients wife, [name].    Transition Care Management Follow-up Telephone Call   Date discharged? 09/24/2016   How have you been since you were released from the hospital? "He's doing much better"   Do you understand why you were in the hospital? yes   Do you understand the discharge instructions? yes   Where were you discharged to? Home. Home Health nurse 2/week. Lives with wife.    Items Reviewed:  Medications reviewed: yes. Pt is not taking Lactulose (wife states Ammonia level back to normal prior to d/c, will continue with Miralax). Pt prescribed Protonix, does not plan to take, will continue Omeprazole.     Allergies reviewed: yes  Dietary changes reviewed: yes  Referrals reviewed: yes   Functional Questionnaire:   Activities of Daily Living (ADLs):   He states they are independent in the following: ambulation, bathing and hygiene, feeding, continence, grooming, toileting and dressing. Walks with cane. Wife remains in bathroom while showering.  States they require assistance with the following: None.    Any transportation issues/concerns?: no   Any patient concerns? no   Confirmed importance and date/time of follow-up visits scheduled yes  Provider Appointment booked with PCP 10/06/16 @ 11am.   Confirmed with patient if condition begins to worsen call PCP or go to the ER.  Patient was given the office number and encouraged to call back with question or concerns.  : yes

## 2016-09-26 LAB — CULTURE, RESPIRATORY W GRAM STAIN: Gram Stain: NONE SEEN

## 2016-09-26 LAB — CULTURE, RESPIRATORY

## 2016-09-27 LAB — CULTURE, BLOOD (ROUTINE X 2)
Culture: NO GROWTH
Culture: NO GROWTH
Special Requests: ADEQUATE
Special Requests: ADEQUATE

## 2016-10-03 ENCOUNTER — Other Ambulatory Visit: Payer: Self-pay | Admitting: Interventional Cardiology

## 2016-10-06 ENCOUNTER — Encounter: Payer: Self-pay | Admitting: Family Medicine

## 2016-10-06 ENCOUNTER — Other Ambulatory Visit: Payer: Self-pay | Admitting: Family Medicine

## 2016-10-06 ENCOUNTER — Ambulatory Visit (INDEPENDENT_AMBULATORY_CARE_PROVIDER_SITE_OTHER): Payer: Medicare Other | Admitting: Family Medicine

## 2016-10-06 VITALS — BP 136/80 | HR 79 | Temp 97.9°F | Resp 18 | Ht 70.0 in

## 2016-10-06 DIAGNOSIS — Z23 Encounter for immunization: Secondary | ICD-10-CM

## 2016-10-06 DIAGNOSIS — Z8669 Personal history of other diseases of the nervous system and sense organs: Secondary | ICD-10-CM | POA: Diagnosis not present

## 2016-10-06 DIAGNOSIS — L989 Disorder of the skin and subcutaneous tissue, unspecified: Secondary | ICD-10-CM

## 2016-10-06 DIAGNOSIS — E7220 Disorder of urea cycle metabolism, unspecified: Secondary | ICD-10-CM

## 2016-10-06 DIAGNOSIS — G9341 Metabolic encephalopathy: Secondary | ICD-10-CM

## 2016-10-06 LAB — CBC WITH DIFFERENTIAL/PLATELET
Basophils Absolute: 0 10*3/uL (ref 0.0–0.1)
Basophils Relative: 0.3 % (ref 0.0–3.0)
EOS PCT: 0.3 % (ref 0.0–5.0)
Eosinophils Absolute: 0 10*3/uL (ref 0.0–0.7)
HCT: 40.9 % (ref 39.0–52.0)
Hemoglobin: 13.5 g/dL (ref 13.0–17.0)
LYMPHS ABS: 1.1 10*3/uL (ref 0.7–4.0)
Lymphocytes Relative: 21.5 % (ref 12.0–46.0)
MCHC: 32.9 g/dL (ref 30.0–36.0)
MCV: 95.9 fl (ref 78.0–100.0)
MONOS PCT: 4.7 % (ref 3.0–12.0)
Monocytes Absolute: 0.2 10*3/uL (ref 0.1–1.0)
NEUTROS ABS: 3.7 10*3/uL (ref 1.4–7.7)
NEUTROS PCT: 73.2 % (ref 43.0–77.0)
PLATELETS: 261 10*3/uL (ref 150.0–400.0)
RBC: 4.26 Mil/uL (ref 4.22–5.81)
RDW: 14.6 % (ref 11.5–15.5)
WBC: 5 10*3/uL (ref 4.0–10.5)

## 2016-10-06 LAB — BASIC METABOLIC PANEL
BUN: 20 mg/dL (ref 6–23)
CO2: 34 meq/L — AB (ref 19–32)
Calcium: 9 mg/dL (ref 8.4–10.5)
Chloride: 98 mEq/L (ref 96–112)
Creatinine, Ser: 0.7 mg/dL (ref 0.40–1.50)
GFR: 118.06 mL/min (ref >60.00)
GLUCOSE: 127 mg/dL — AB (ref 70–99)
POTASSIUM: 4.7 meq/L (ref 3.5–5.1)
SODIUM: 134 meq/L — AB (ref 135–145)

## 2016-10-06 LAB — AMMONIA: Ammonia: 79 umol/L — ABNORMAL HIGH (ref 11–35)

## 2016-10-06 NOTE — Patient Instructions (Addendum)
Schedule your Medicare Wellness Visit w/ Maudie Mercury in the next month or so Follow up with me in 3-4 months to recheck BP and cholesterol We'll notify you of your lab results and make any changes if needed No med changes at this time Call with any questions or concerns Happy Labor Day!!!

## 2016-10-06 NOTE — Progress Notes (Signed)
   Subjective:    Patient ID: Malik Zuniga, male    DOB: 1945-11-08, 71 y.o.   MRN: [id number]  [facility] f/u- pt was admitted 8/13-15 w/ altered mental status.  He was dx'd w/ metabolic encephalopathy which was thought to be polypharmacy (Zanaflex was stopped) and elevated ammonia level due to Depakote.  He was transitioned from IV abx to PO Augmentin for possible aspiration PNA but imaging was inconclusive.  Finished course of Augmentin.  No fevers.  Continues to cough- intermittently productive.  Wife reports confusion is much better than at time of admission.  Pt has not needed the Lactulose due to regular BMs on the Miralax.   Review of Systems For ROS see HPI     Objective:   Physical Exam  Constitutional: He is oriented to person, place, and time. He appears well-developed and well-nourished. No distress.  HENT:  Head: Normocephalic and atraumatic.  Neck: Normal range of motion. Neck supple.  Cardiovascular: Normal rate, regular rhythm, normal heart sounds and intact distal pulses.   Pulmonary/Chest: Effort normal and breath sounds normal. No respiratory distress.  Lymphadenopathy:    He has no cervical adenopathy.  Neurological: He is alert and oriented to person, place, and time.  Skin: Skin is warm and dry.  Wife reports 'spongy' lesions on back but due to position in wheelchair, these are not visible  Psychiatric: He has a normal mood and affect. His behavior is normal. Thought content normal.  Vitals reviewed.         Assessment & Plan:  Metabolic encephalopathy- resolved.  Pt's mental status is back to baseline.  Completed his abx for possible aspiration PNA despite inconclusive imaging.  Has not been using his Lactulose due to regular BMs.  Will repeat ammonia, BMP, and CBC as requested in reviewed d/c summary.  Med rec was performed and records reviewed.  No med changes at this time.  Wife is aware of what to look for.  Will follow.

## 2016-10-06 NOTE — Progress Notes (Signed)
Pre visit review using our clinic review tool, if applicable. No additional management support is needed unless otherwise documented below in the visit note. 

## 2016-10-07 ENCOUNTER — Ambulatory Visit: Payer: Medicare Other | Admitting: Pulmonary Disease

## 2016-10-07 ENCOUNTER — Telehealth: Payer: Self-pay | Admitting: Interventional Cardiology

## 2016-10-07 NOTE — Telephone Encounter (Signed)
Pt seen by Daune Perch, NP in June.  Pt states he has been doing fine since seeing her.  Denies CP, SOB or other cardiac issues.  Advised I would send message to Dr. Tamala Julian to see if ok to delay pt's appt and when f/u should be if so?

## 2016-10-07 NOTE — Telephone Encounter (Signed)
New message   Pt states that he doesn't need to be seen on 31st by Dr. Tamala Julian. He requests a call back with confirmation that he doesn't need to be seen base don what Dr. Tamala Julian thinks.

## 2016-10-08 NOTE — Telephone Encounter (Signed)
Left message to call back  

## 2016-10-08 NOTE — Telephone Encounter (Signed)
He will be okay to cancel the upcoming visit. Next appointment with me should be one year following his visit with Janine.

## 2016-10-09 NOTE — Telephone Encounter (Signed)
Spoke with pt and made him aware ok to cancel upcoming appt and plan to see [place] a year from the time he was last seen which was June 2018.  Pt verbalized understanding and was appreciative for call.  Placed recall in system for pt to be seen June 2019.

## 2016-10-10 ENCOUNTER — Other Ambulatory Visit (INDEPENDENT_AMBULATORY_CARE_PROVIDER_SITE_OTHER): Payer: Medicare Other

## 2016-10-10 ENCOUNTER — Ambulatory Visit: Payer: Medicare Other | Admitting: Interventional Cardiology

## 2016-10-10 ENCOUNTER — Encounter: Payer: Self-pay | Admitting: Family Medicine

## 2016-10-10 DIAGNOSIS — E7220 Disorder of urea cycle metabolism, unspecified: Secondary | ICD-10-CM

## 2016-10-10 LAB — AMMONIA: Ammonia: 67 umol/L — ABNORMAL HIGH (ref 11–35)

## 2016-10-10 MED ORDER — LACTULOSE 10 GM/15ML PO SOLN: 20.0000 g | Freq: Every day | ORAL | 0 refills | Status: DC

## 2016-10-15 ENCOUNTER — Other Ambulatory Visit (HOSPITAL_COMMUNITY): Payer: Self-pay | Admitting: Psychiatry

## 2016-10-20 ENCOUNTER — Telehealth: Payer: Self-pay | Admitting: Radiation Oncology

## 2016-10-20 ENCOUNTER — Encounter: Payer: Self-pay | Admitting: Family Medicine

## 2016-10-20 NOTE — Telephone Encounter (Signed)
I spoke with the patient's wife , and we talked about her husband's recent hospitalization. He has been having elevated ammonia levels without specific understanding of why this is occurring. He has not had any changes in his Depakote levels for more than 10 years or history of changes in ammonia levels. I suggested that his wife contacted Dr. Watt Climes as well for his input regarding this.regarding history of stage IA lung cancer, the patient appears to be radiographically stable by CT imaging. We discussed the limitations of a nontoxic contrasted CT scan however it appears that a treated disease to knees to be in remission with minimal scarring consistent with the effects of prior therapy. I will contact the patient's wife and about 5 months to determine his process for moving forward. There is been some question raised about the utility of hospice in his case, however at this time she states that he remains under the care [facility] with home health nursing, and is pleased with this. She will contact us if his status changes.

## 2016-10-21 ENCOUNTER — Other Ambulatory Visit: Payer: Self-pay | Admitting: Family Medicine

## 2016-10-21 DIAGNOSIS — R7989 Other specified abnormal findings of blood chemistry: Secondary | ICD-10-CM

## 2016-10-22 ENCOUNTER — Other Ambulatory Visit (INDEPENDENT_AMBULATORY_CARE_PROVIDER_SITE_OTHER): Payer: Medicare Other

## 2016-10-22 DIAGNOSIS — R7989 Other specified abnormal findings of blood chemistry: Secondary | ICD-10-CM

## 2016-10-22 LAB — AMMONIA: Ammonia: 55 umol/L — ABNORMAL HIGH (ref 11–35)

## 2016-10-23 ENCOUNTER — Ambulatory Visit: Payer: Self-pay | Admitting: Radiation Oncology

## 2016-10-23 ENCOUNTER — Other Ambulatory Visit: Payer: Self-pay | Admitting: General Practice

## 2016-10-23 MED ORDER — LACTULOSE 10 GM/15ML PO SOLN: 20.0000 g | Freq: Every day | ORAL | 2 refills | Status: DC

## 2016-10-30 ENCOUNTER — Ambulatory Visit (INDEPENDENT_AMBULATORY_CARE_PROVIDER_SITE_OTHER): Payer: Medicare Other

## 2016-10-30 DIAGNOSIS — Z0000 Encounter for general adult medical examination without abnormal findings: Secondary | ICD-10-CM

## 2016-10-30 DIAGNOSIS — Z23 Encounter for immunization: Secondary | ICD-10-CM

## 2016-10-30 MED ORDER — ZOSTER VAC RECOMB ADJUVANTED 50 MCG/0.5ML IM SUSR: 0.5000 mL | Freq: Once | INTRAMUSCULAR | 1 refills | Status: AC

## 2016-10-30 NOTE — Progress Notes (Addendum)
Subjective:   Malik Zuniga is a 71 y.o. male who presents for Medicare Annual/Subsequent preventive examination.  Review of Systems:  No ROS.  Medicare Wellness Visit. Additional risk factors are reflected in the social history.  Cardiac Risk Factors include: advanced age (>38men, >72 women);dyslipidemia;hypertension;male gender;sedentary lifestyle;family history of premature cardiovascular disease   Sleep patterns: Sleeps 4 hours, naps during the day. Oxygen at night.  Home Safety/Smoke Alarms: Feels safe in home. Smoke alarms in place.  Living environment; residence and Firearm Safety: Lives with wife 1 story home. Ramp at door.  Seat Belt Safety/Bike Helmet: Wears seat belt.   Male:   CCS-Colonoscopy > 5 years. H/O polyps.      PSA-  Lab Results  Component Value Date   PSA 0.01 (L) 08/23/2013   PSA 0.00 (L) 04/22/2012   PSA 0.01 (L) 04/15/2010       Objective:    Vitals: BP 112/62 (BP Location: Left Arm, Patient Position: Sitting, Cuff Size: Normal)   Pulse 90   SpO2 92%   There is no height or weight on file to calculate BMI.  Tobacco History  Smoking Status  . Former Smoker  . Packs/day: 1.50  . Years: 52.00  . Types: Cigarettes  . Quit date: 07/17/2012  Smokeless Tobacco  . Never Used    Comment: Former smoker     Counseling given: Not Answered   Past Medical History:  Diagnosis Date  . Anemia associated with acute blood loss   . Angioedema 10/31/2010  . Anxiety    takes Valium daily  . Aspiration pneumonia ([place]) 2010  . Asthma   . Bacteremia due to Pseudomonas 10/04/2011   D/t gram neg rods   . Bipolar affective disorder ([place])   . 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 Chronic back pain    compression fracture  . Constipation    takes Colace daily as well as Miralax  . Coronary artery disease   . Depression    takes Cymbalta daily  . Emphysema of lung (HCC)    Albuterol as needed;Symbicort daily and  Singulair at bedtime  . GERD (gastroesophageal reflux disease)    takes Omeprazole daily  . Headache, chronic daily   . History of bronchitis   . History of colon polyps   . History of migraine    last migraine a couple of days ago;takes Excedrin Migraine  . History of prostate cancer 2004  . Hyperlipidemia    takes Simvastatin daily  . Hypertension    takes Metoprolol daily as well as Hyzaar  . Insomnia    takes Benadryl nightly  . Lung cancer ([place]) 2016  . Mood change ([place])    after Brain surgery mood changed and was placed on Depakote  . Myocardial infarction ([place]) 04/1998  . Neuromuscular disorder ([place]) 1998   right carpal tunnel release  . On home O2   . Shortness of breath    with exertion  . Stroke [facility]) 1998   Brain Aneurysm  . Tremor    Past Surgical History:  Procedure Laterality Date  . BACK SURGERY  08/2004; 02/2005; 04/2006; 06/2007; 7/20101   all by Dr. Annette Stable  . BACK SURGERY  05/2013  . BRAIN SURGERY  1999   clip aneurysm  . [place]   right  . CHOLECYSTECTOMY  1996  . COLONOSCOPY    . CORONARY ANGIOPLASTY WITH STENT  PLACEMENT  2000   RCA stent, Dr. Rollene Fare; denies stent for PAD 06/15/13  . CORONARY ANGIOPLASTY WITH STENT PLACEMENT  04/1998  . CRANIOTOMY  1999   to clip aneurism, Dr. Annette Stable  . DG BIOPSY LUNG    . ESOPHAGOGASTRODUODENOSCOPY N/A 07/23/2012   Procedure: ESOPHAGOGASTRODUODENOSCOPY (EGD);  Surgeon: Jeryl Columbia, MD;  Location: [facility] ENDOSCOPY;  Service: Endoscopy;  Laterality: N/A;  buccini /ja  . GASTROSTOMY W/ FEEDING TUBE  2008   Dr. Watt Climes; only had for a few months  . HAND SURGERY  1989   crushed thumb, Dr. Fredna Dow  . PROSTATECTOMY  04/2001   removal of prostate cancer, Dr. Janice Norrie  . SPINE SURGERY    . [place]   left arm, Dr. Fredna Dow   Family History  Problem Relation Age of Onset  . Heart disease Mother   . Cancer Father        brain cancer and prostate cancer    History  Sexual Activity  . Sexual activity:  No    Outpatient Encounter Prescriptions as of 10/30/2016  Medication Sig  . ARIPiprazole (ABILIFY) 10 MG tablet Take 5 mg by mouth daily.   Marland Kitchen aspirin-acetaminophen-caffeine (EXCEDRIN MIGRAINE) 250-250-65 MG tablet Take 2 tablets by mouth 3 (three) times daily.   Marland Kitchen atorvastatin (LIPITOR) 20 MG tablet TAKE 1 TABLET (20 MG TOTAL) BY MOUTH DAILY AT 6 PM.  . budesonide (PULMICORT) 0.25 MG/2ML nebulizer solution Take 4 mLs (0.5 mg total) by nebulization 2 (two) times daily.  . cetirizine (ZYRTEC) 10 MG tablet Take 10 mg by mouth daily.  . cholecalciferol (VITAMIN D) 1000 UNITS tablet Take 1,000 Units by mouth every morning.   . dabigatran (PRADAXA) 150 MG CAPS capsule Take 1 capsule (150 mg total) by mouth every 12 (twelve) hours.  . diazepam (VALIUM) 5 MG tablet Take 1 tablet (5 mg total) by mouth every 12 (twelve) hours as needed for anxiety or muscle spasms. scheduled  . diltiazem (CARDIZEM CD) 240 MG 24 hr capsule Take 1 capsule (240 mg total) by mouth daily.  . divalproex (DEPAKOTE) 500 MG 24 hr tablet Take 1,000 mg by mouth at bedtime.   . docusate sodium (COLACE) 100 MG capsule Take 200 mg by mouth at bedtime.   . DULoxetine (CYMBALTA) 60 MG capsule Take 120 mg by mouth daily.   . feeding supplement, ENSURE ENLIVE, (ENSURE ENLIVE) LIQD Take 237 mLs by mouth daily.  Marland Kitchen HYDROcodone-acetaminophen (NORCO) 10-325 MG tablet Take 1 tablet by mouth 4 (four) times daily.  Marland Kitchen ipratropium-albuterol (DUONEB) 0.5-2.5 (3) MG/3ML SOLN Take 3 mLs by nebulization every 6 (six) hours.  Marland Kitchen lactulose (CHRONULAC) 10 GM/15ML solution Take 30 mLs (20 g total) by mouth daily.  Marland Kitchen losartan (COZAAR) 50 MG tablet Take 50 mg by mouth at bedtime.   . Maltodextrin-Xanthan Gum (RESOURCE THICKENUP CLEAR) POWD Take 1 g by mouth as needed.  . metoprolol tartrate (LOPRESSOR) 50 MG tablet Take 1 tablet (50 mg total) by mouth 2 (two) times daily.  . montelukast (SINGULAIR) 10 MG tablet TAKE 1 TABLET AT BEDTIME  . nitroGLYCERIN  (NITROSTAT) 0.4 MG SL tablet PLACE 1 TABLET (0.4 MG TOTAL) UNDER THE TONGUE EVERY 5 (FIVE) MINUTES AS NEEDED FOR CHEST PAIN.  Marland Kitchen omeprazole (PRILOSEC) 40 MG capsule Take 40 mg by mouth 2 (two) times daily.   . ondansetron (ZOFRAN) 4 MG tablet 4 mg once.   . OXYGEN Inhale 2.5 L into the lungs at bedtime.  . polyethylene glycol (MIRALAX /  GLYCOLAX) packet Take 17 g by mouth every evening.   . predniSONE (DELTASONE) 10 MG tablet TAKE 1 TABLET (10 MG TOTAL) BY MOUTH DAILY WITH BREAKFAST.  Marland Kitchen primidone (MYSOLINE) 250 MG tablet TAKE 1 TABLET TWICE DAILY  . ranitidine (ZANTAC) 150 MG tablet Take 300 mg by mouth 2 (two) times daily.   . sodium chloride HYPERTONIC 3 % nebulizer solution Take by nebulization 2 (two) times daily. (Patient taking differently: Take 2-4 mLs by nebulization daily. )  . fluticasone (FLONASE) 50 MCG/ACT nasal spray Place 2 sprays into both nostrils daily as needed for allergies or rhinitis.  Marland Kitchen Zoster Vac Recomb Adjuvanted [facility]) injection Inject 0.5 mLs into the muscle once.   No facility-administered encounter medications on file as of 10/30/2016.     Activities of Daily Living In your present state of health, do you have any difficulty performing the following activities: 10/30/2016 09/22/2016  Hearing? [name]  Vision? N N  Difficulty concentrating or making decisions? [name]  Walking or climbing stairs? [name]  Comment Walks with walker at home; wheelchair while out of house -  Dressing or bathing? N Y  Comment Wife present during ADL's -  Doing errands, shopping? [name]  Preparing Food and eating ? Y -  Comment Wife prepares food, puree foods as necessary -  Using the Toilet? N -  In the past six months, have you accidently leaked urine? N -  Do you have problems with loss of bowel control? N -  Managing your Medications? Y -  Comment Wife manages -  Managing your Finances? Y -  Comment Wife manages -  Housekeeping or managing your Housekeeping? Y -  Comment Wife manages -    Some recent data might be hidden    Patient Care Team: [name], MD as PCP - General [name], MD as Consulting Physician (Pulmonary Disease) Belva Crome, MD as Consulting Physician (Cardiology) Tat, Eustace Quail, DO as Consulting Physician (Neurology) Clarene Essex, MD as Consulting Physician (Gastroenterology) Franchot Gallo, MD as Consulting Physician (Urology) Norma Fredrickson, MD as Consulting Physician (Psychiatry) Earnie Larsson, MD as Consulting Physician (Neurosurgery)   Assessment:    Physical assessment deferred to PCP.  Exercise Activities and Dietary recommendations Current Exercise Habits: The patient does not participate in regular exercise at present, Exercise limited by: orthopedic condition(s);neurologic condition(s);respiratory conditions(s)   Diet (meal preparation, eat out, water intake, caffeinated beverages, dairy products, fruits and vegetables): Drinks ice tea, ensure and water.   Breakfast: Egg sandwich; cereal  Lunch: skips Dinner: Protein and vegetables, parsnips (daily)  Pureed food and liquid thickener as needed.  Encouraged to perform chair exercises provided by PT and Rehab.   Goals      Patient Stated   . <enter goal here> (pt-stated)          Maintain/increase current health by staying as active as possible.       Fall Risk Fall Risk  10/30/2016 04/17/2016 01/09/2016 12/13/2015 11/12/2015  Falls in the past year? Yes Yes Yes Yes Yes  Number falls in past yr: 2 or more 2 or more 2 or more 2 or more 2 or more  Comment - - - patient fell twice today with no known injuries -  Injury with Fall? No Yes No (No Data) Yes  Comment - - - no injury noted today -  Risk Factor Category  - High Fall Risk High Fall Risk - High Fall Risk  Risk for fall  due to : Impaired balance/gait - History of fall(s) Impaired balance/gait;History of fall(s);Impaired mobility Impaired balance/gait;Impaired mobility;History of fall(s)  Risk for fall due  to: Comment - - - - -  Follow up Falls prevention discussed Falls evaluation completed Falls prevention discussed Education provided;Falls prevention discussed -   Depression Screen PHQ 2/9 Scores 10/30/2016 10/06/2016 04/17/2016 01/09/2016  PHQ - 2 Score 0 0 0 1  PHQ- 9 Score - 0 - -    Cognitive Function MMSE - Mini Mental State Exam 10/30/2016  Not completed: Refused        Immunization History  Administered Date(s) Administered  . Influenza Split 01/31/2011, 10/30/2011  . Influenza Whole 11/10/2009  . Influenza, High Dose Seasonal PF 12/16/2012  . Influenza,inj,Quad PF,6+ Mos 10/12/2013, 12/12/2014, 10/10/2015, 10/06/2016  . Pneumococcal Conjugate-13 08/23/2013  . Pneumococcal Polysaccharide-23 12/19/2004, 10/30/2016  . Td 05/19/2007  . Zoster 07/11/2008   Screening Tests Health Maintenance  Topic Date Due  . PNA vac Low Risk Adult (2 of 2 - PPSV23) 08/24/2014  . COLONOSCOPY  11/10/2016 (Originally 06/18/1995)  . Hepatitis C Screening  11/10/2016 (Originally [birth year])  . TETANUS/TDAP  05/18/2017  . INFLUENZA VACCINE  Completed      Plan:     Shingles vaccine at pharmacy  Call GI regarding nausea/colonoscopy.   Bring a copy of your living will and/or healthcare power of attorney to your next office visit.  Continue doing brain stimulating activities (puzzles, reading, adult coloring books, staying active) to keep memory sharp.   I have personally reviewed and noted the following in the patient's chart:   . Medical and social history . Use of alcohol, tobacco or illicit drugs  . Current medications and supplements . Functional ability and status . Nutritional status . Physical activity . Advanced directives . List of other physicians . Hospitalizations, surgeries, and ER visits in previous 12 months . Vitals . Screenings to include cognitive, depression, and falls . Referrals and appointments  In addition, I have reviewed and discussed with patient certain  preventive protocols, quality metrics, and best practice recommendations. A written personalized care plan for preventive services as well as general preventive health recommendations were provided to patient.     Gerilyn Nestle, RN  10/30/2016  PCP Notes: -Pt reports nausea daily, advised to f/u with GI. -Increased oxygen use during the day (in the past, only at night). Advised to contact pulmonology to update and obtain portable oxygen.  -[facility] RN visits home every 2 weeks.  -F/U with PCP 01/12/17  Reviewed documentation provided by RN and agree w/ above.  [name], MD

## 2016-10-30 NOTE — Patient Instructions (Addendum)
Shingles vaccine at pharmacy  Call GI regarding nausea/colonoscopy.   Bring a copy of your living will and/or healthcare power of attorney to your next office visit.  Continue doing brain stimulating activities (puzzles, reading, adult coloring books, staying active) to keep memory sharp.    Fall Prevention in the Home Falls can cause injuries. They can happen to people of all ages. There are many things you can do to make your home safe and to help prevent falls. What can I do on the outside of my home?  Regularly fix the edges of walkways and driveways and fix any cracks.  Remove anything that might make you trip as you walk through a door, such as a raised step or threshold.  Trim any bushes or trees on the path to your home.  Use bright outdoor lighting.  Clear any walking paths of anything that might make someone trip, such as rocks or tools.  Regularly check to see if handrails are loose or broken. Make sure that both sides of any steps have handrails.  Any raised decks and porches should have guardrails on the edges.  Have any leaves, snow, or ice cleared regularly.  Use sand or salt on walking paths during winter.  Clean up any spills in your garage right away. This includes oil or grease spills. What can I do in the bathroom?  Use night lights.  Install grab bars by the toilet and in the tub and shower. Do not use towel bars as grab bars.  Use non-skid mats or decals in the tub or shower.  If you need to sit down in the shower, use a plastic, non-slip stool.  Keep the floor dry. Clean up any water that spills on the floor as soon as it happens.  Remove soap buildup in the tub or shower regularly.  Attach bath mats securely with double-sided non-slip rug tape.  Do not have throw rugs and other things on the floor that can make you trip. What can I do in the bedroom?  Use night lights.  Make sure that you have a light by your bed that is easy to  reach.  Do not use any sheets or blankets that are too big for your bed. They should not hang down onto the floor.  Have a firm chair that has side arms. You can use this for support while you get dressed.  Do not have throw rugs and other things on the floor that can make you trip. What can I do in the kitchen?  Clean up any spills right away.  Avoid walking on wet floors.  Keep items that you use a lot in easy-to-reach places.  If you need to reach something above you, use a strong step stool that has a grab bar.  Keep electrical cords out of the way.  Do not use floor polish or wax that makes floors slippery. If you must use wax, use non-skid floor wax.  Do not have throw rugs and other things on the floor that can make you trip. What can I do with my stairs?  Do not leave any items on the stairs.  Make sure that there are handrails on both sides of the stairs and use them. Fix handrails that are broken or loose. Make sure that handrails are as long as the stairways.  Check any carpeting to make sure that it is firmly attached to the stairs. Fix any carpet that is loose or worn.  Avoid having  throw rugs at the top or bottom of the stairs. If you do have throw rugs, attach them to the floor with carpet tape.  Make sure that you have a light switch at the top of the stairs and the bottom of the stairs. If you do not have them, ask someone to add them for you. What else can I do to help prevent falls?  Wear shoes that: ? Do not have high heels. ? Have rubber bottoms. ? Are comfortable and fit you well. ? Are closed at the toe. Do not wear sandals.  If you use a stepladder: ? Make sure that it is fully opened. Do not climb a closed stepladder. ? Make sure that both sides of the stepladder are locked into place. ? Ask someone to hold it for you, if possible.  Clearly mark and make sure that you can see: ? Any grab bars or handrails. ? First and last steps. ? Where the  edge of each step is.  Use tools that help you move around (mobility aids) if they are needed. These include: ? Canes. ? Walkers. ? Scooters. ? Crutches.  Turn on the lights when you go into a dark area. Replace any light bulbs as soon as they burn out.  Set up your furniture so you have a clear path. Avoid moving your furniture around.  If any of your floors are uneven, fix them.  If there are any pets around you, be aware of where they are.  Review your medicines with your doctor. Some medicines can make you feel dizzy. This can increase your chance of falling. Ask your doctor what other things that you can do to help prevent falls. This information is not intended to replace advice given to you by your health care provider. Make sure you discuss any questions you have with your health care provider. Document Released: 11/23/2008 Document Revised: 07/05/2015 Document Reviewed: 03/03/2014 Elsevier Interactive Patient Education  2018 [place] Maintenance, Male A healthy lifestyle and preventive care is important for your health and wellness. Ask your health care provider about what schedule of regular examinations is right for you. What should I know about weight and diet? Eat a Healthy Diet  Eat plenty of vegetables, fruits, whole grains, low-fat dairy products, and lean protein.  Do not eat a lot of foods high in solid fats, added sugars, or salt.  Maintain a Healthy Weight Regular exercise can help you achieve or maintain a healthy weight. You should:  Do at least 150 minutes of exercise each week. The exercise should increase your heart rate and make you sweat (moderate-intensity exercise).  Do strength-training exercises at least twice a week.  Watch Your Levels of Cholesterol and Blood Lipids  Have your blood tested for lipids and cholesterol every 5 years starting at 71 years of age. If you are at high risk for heart disease, you should start having your  blood tested when you are 71 years old. You may need to have your cholesterol levels checked more often if: ? Your lipid or cholesterol levels are high. ? You are older than 71 years of age. ? You are at high risk for heart disease.  What should I know about cancer screening? Many types of cancers can be detected early and may often be prevented. Lung Cancer  You should be screened every year for lung cancer if: ? You are a current smoker who has smoked for at least 30 years. ?  You are a former smoker who has quit within the past 15 years.  Talk to your health care provider about your screening options, when you should start screening, and how often you should be screened.  Colorectal Cancer  Routine colorectal cancer screening usually begins at 71 years of age and should be repeated every 5-10 years until you are 71 years old. You may need to be screened more often if early forms of precancerous polyps or small growths are found. Your health care provider may recommend screening at an earlier age if you have risk factors for colon cancer.  Your health care provider may recommend using home test kits to check for hidden blood in the stool.  A small camera at the end of a tube can be used to examine your colon (sigmoidoscopy or colonoscopy). This checks for the earliest forms of colorectal cancer.  Prostate and Testicular Cancer  Depending on your age and overall health, your health care provider may do certain tests to screen for prostate and testicular cancer.  Talk to your health care provider about any symptoms or concerns you have about testicular or prostate cancer.  Skin Cancer  Check your skin from head to toe regularly.  Tell your health care provider about any new moles or changes in moles, especially if: ? There is a change in a mole's size, shape, or color. ? You have a mole that is larger than a pencil eraser.  Always use sunscreen. Apply sunscreen liberally and  repeat throughout the day.  Protect yourself by wearing long sleeves, pants, a wide-brimmed hat, and sunglasses when outside.  What should I know about heart disease, diabetes, and high blood pressure?  If you are 59-29 years of age, have your blood pressure checked every 3-5 years. If you are 25 years of age or older, have your blood pressure checked every year. You should have your blood pressure measured twice-once when you are at a hospital or clinic, and once when you are not at a hospital or clinic. Record the average of the two measurements. To check your blood pressure when you are not at a hospital or clinic, you can use: ? An automated blood pressure machine at a pharmacy. ? A home blood pressure monitor.  Talk to your health care provider about your target blood pressure.  If you are between 63-64 years old, ask your health care provider if you should take aspirin to prevent heart disease.  Have regular diabetes screenings by checking your fasting blood sugar level. ? If you are at a normal weight and have a low risk for diabetes, have this test once every three years after the age of 45. ? If you are overweight and have a high risk for diabetes, consider being tested at a younger age or more often.  A one-time screening for abdominal aortic aneurysm (AAA) by ultrasound is recommended for men aged 60-75 years who are current or former smokers. What should I know about preventing infection? Hepatitis B If you have a higher risk for hepatitis B, you should be screened for this virus. Talk with your health care provider to find out if you are at risk for hepatitis B infection. Hepatitis C Blood testing is recommended for:  Everyone born from [birth year] through 1965.  Anyone with known risk factors for hepatitis C.  Sexually Transmitted Diseases (STDs)  You should be screened each year for STDs including gonorrhea and chlamydia if: ? You are sexually active and are younger  than 71  years of age. ? You are older than 71 years of age and your health care provider tells you that you are at risk for this type of infection. ? Your sexual activity has changed since you were last screened and you are at an increased risk for chlamydia or gonorrhea. Ask your health care provider if you are at risk.  Talk with your health care provider about whether you are at high risk of being infected with HIV. Your health care provider may recommend a prescription medicine to help prevent HIV infection.  What else can I do?  Schedule regular health, dental, and eye exams.  Stay current with your vaccines (immunizations).  Do not use any tobacco products, such as cigarettes, chewing tobacco, and e-cigarettes. If you need help quitting, ask your health care provider.  Limit alcohol intake to no more than 2 drinks per day. One drink equals 12 ounces of beer, 5 ounces of wine, or 1 ounces of hard liquor.  Do not use street drugs.  Do not share needles.  Ask your health care provider for help if you need support or information about quitting drugs.  Tell your health care provider if you often feel depressed.  Tell your health care provider if you have ever been abused or do not feel safe at home. This information is not intended to replace advice given to you by your health care provider. Make sure you discuss any questions you have with your health care provider. Document Released: 07/26/2007 Document Revised: 09/26/2015 Document Reviewed: 10/31/2014 Elsevier Interactive Patient Education  [employer].

## 2016-11-05 ENCOUNTER — Encounter: Payer: Self-pay | Admitting: Pulmonary Disease

## 2016-11-05 ENCOUNTER — Ambulatory Visit (INDEPENDENT_AMBULATORY_CARE_PROVIDER_SITE_OTHER): Payer: Medicare Other | Admitting: Pulmonary Disease

## 2016-11-05 VITALS — BP 134/72 | HR 78

## 2016-11-05 DIAGNOSIS — J470 Bronchiectasis with acute lower respiratory infection: Secondary | ICD-10-CM

## 2016-11-05 DIAGNOSIS — I25118 Atherosclerotic heart disease of native coronary artery with other forms of angina pectoris: Secondary | ICD-10-CM

## 2016-11-05 MED ORDER — SODIUM CHLORIDE 3 % IN NEBU: INHALATION_SOLUTION | Freq: Two times a day (BID) | RESPIRATORY_TRACT | 12 refills | Status: AC

## 2016-11-05 MED ORDER — PREDNISONE 20 MG PO TABS: 20.0000 mg | ORAL_TABLET | Freq: Every day | ORAL | 0 refills | Status: DC

## 2016-11-05 MED ORDER — LEVOFLOXACIN 750 MG PO TABS: 750.0000 mg | ORAL_TABLET | Freq: Every day | ORAL | 0 refills | Status: DC

## 2016-11-05 NOTE — Progress Notes (Signed)
Subjective:    Patient ID: Malik Zuniga, male    DOB: Feb 06, 1946, 71 y.o.   MRN: [id number]  Synopsis: former patient of Dr. 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   [name] 04/2009:  FEV1 1.65 (47%), FEV1% 49  CT chest 10/2013:  RLL infiltrates CXR 10/2013:  LLL infiltrate +h/o aspiration in the past.  +speech therapy evaluation with recs given.  Admitted multiple times in 2017 for recurrent aspiration pneumonia. Started therapy vest 2017  HPI  Chief Complaint  Patient presents with  . Follow-up    pt c/o baseline sinus congestion, pnd, prod cough with light brown mucus.     Candon was hospitalized in August due to confusion and hyperammonemia.  It was felt that this was related to his depakote.  They didn't stop this medicines.  His confusion cleared up and he is not doing better.  Apparently his primary doctor is still following ammonia levels and he is taking lactulose.   He says that he has been congested lately and he has been coughing up "goober" which are light brown to dark brown in color for the last few days.  He is still using his hypertonic saline once a day, this helps him clear out the mucus.  He feels like he is getting more short of breath.  He is using his oxygen a lot during the daytime.  He is using his therapy vest twice a day.    Past Medical History:  Diagnosis Date  . Anemia associated with acute blood loss   . Angioedema 10/31/2010  . Anxiety    takes Valium daily  . Aspiration pneumonia ([place]) 2010  . Asthma   . Bacteremia due to Pseudomonas 10/04/2011   D/t gram neg rods   . Bipolar affective disorder ([place])   . 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 Chronic back pain    compression fracture  . Constipation    takes Colace daily as well as Miralax  . Coronary artery disease   . Depression    takes Cymbalta daily  . Emphysema of lung (HCC)    Albuterol as needed;Symbicort daily and Singulair at bedtime  . GERD (gastroesophageal reflux disease)    takes Omeprazole daily  . Headache, chronic daily   . History of bronchitis   . History of colon polyps   . History of migraine    last migraine a couple of days ago;takes Excedrin Migraine  . History of prostate cancer 2004  . Hyperlipidemia    takes Simvastatin daily  . Hypertension    takes Metoprolol daily as well as Hyzaar  . Insomnia    takes Benadryl nightly  . Lung cancer ([place]) 2016  . Mood change ([place])    after Brain surgery mood changed and was placed on Depakote  . Myocardial infarction ([place]) 04/1998  . Neuromuscular disorder ([place]) 1998   right carpal tunnel release  . On home O2   . Shortness of breath    with exertion  . Stroke [facility]) 1998   Brain Aneurysm  . Tremor        ROS: Gen: no Fever, chills, sweats HEENT: No sinus congestion, post nasal drip, headache PULM: No cough, dyspnea, chest tightness, wheezing  CV: No chest pain, no leg swelling, no palpitations  Objective:   Physical Exam Vitals:   11/05/16 0930  BP: 134/72  Pulse: 78  SpO2: 93%   RA  Gen: chronically ill appearing in wheelchair HENT: OP clear, TM's clear, neck supple PULM: Crackles RLL B, normal percussion CV: RRR, no mgr, trace edema GI: BS+, soft, nontender Derm: no cyanosis or rash Psyche: normal mood and affect   Hospitalization records from 09/2016 reviewed where he was admitted for encephalopathy, sedating meds were held and he was discharged on hospice  Sleep study: 05/2016 No obstructive sleep apnea, but he had hypoxemia noted nightly.      Chest imaging: March 2018 CT chest images independently reviewed showing what appears to be mucus in the left mainstem bronchus. August 2018 CT chest images independently reviewed showing moderate centrilobular emphysema and upper lobe predominant fashion, right  lower lobe nodule noted, no growth, radiation fibrotic changes surrounding  CBC    Component Value Date/Time   WBC 5.0 10/06/2016 1153   RBC 4.26 10/06/2016 1153   HGB 13.5 10/06/2016 1153   HCT 40.9 10/06/2016 1153   PLT 261.0 10/06/2016 1153   PLT 329 12/10/2009   MCV 95.9 10/06/2016 1153   MCH 30.7 09/24/2016 0805   MCHC 32.9 10/06/2016 1153   RDW 14.6 10/06/2016 1153   LYMPHSABS 1.1 10/06/2016 1153   MONOABS 0.2 10/06/2016 1153   EOSABS 0.0 10/06/2016 1153   BASOSABS 0.0 10/06/2016 1153    June 2018 hospitalization discharge summary records reviewed were he was admitted for acute on chronic respiratory failure with hypoxemia secondary to aspiration pneumonia and her bronchiectasis flareup.     Assessment & Plan:  Bronchiectasis with acute lower respiratory infection ([place]) - Plan: Respiratory or Resp and Sputum Culture, Fungus Culture & Smear, AFB Culture & Smear  Discussion: Mr. [name] was hospitalized for confusion a month ago but that seems to have improved. However, he showing signs of an early bronchiectasis exacerbation in that he is producing more brown mucus and feels a bit more congested and short of breath. He has been compliant with his mucociliary clearance measures. We need to get an antibiotic going now to prevent him from being hospitalized again.   I do worry about the possibility of resistant organisms given his recurrent exacerbations throughout the course of the year. He may need to start on an oral daily antibiotic like azithromycin or inhale tobramycin in order prevent these flareups. We will see what the culture results show and then make that decision.  Plan: Bronchiectasis with acute exacerbation: Take Levaquin 750 mg daily for the next 7 days We will change the prescription for hypertonic saline [facility], use it twice a day for the next weeks Keep using your therapy vest twice a day Take prednisone 20 mg daily for 5 days, then resume 10 mg  daily Continue taking Pulmicort twice a day Continue taking DuoNeb 4 times a day Let us know if your symptoms do not improve Provide [place] with a sample of mucus  For chronic respiratory failure with hypoxemia: Keep using 2 and half liters of oxygen throughout the day  We will have you follow-up with one of our nurse practitioners next week to review the culture result. Depending on the results we will likely start you on a daily antibiotic such as azithromycin or inhale tobramycin.   Current Outpatient Prescriptions:  .  ARIPiprazole (ABILIFY) 10 MG tablet, Take 5 mg by mouth daily. , Disp: , Rfl:  .  aspirin-acetaminophen-caffeine (EXCEDRIN MIGRAINE) [phone]  MG tablet, Take 2 tablets by mouth 3 (three) times daily. , Disp: , Rfl:  .  atorvastatin (LIPITOR) 20 MG tablet, TAKE 1 TABLET (20 MG TOTAL) BY MOUTH DAILY AT 6 PM., Disp: 90 tablet, Rfl: 2 .  budesonide (PULMICORT) 0.25 MG/2ML nebulizer solution, Take 4 mLs (0.5 mg total) by nebulization 2 (two) times daily., Disp: 60 mL, Rfl: 3 .  cetirizine (ZYRTEC) 10 MG tablet, Take 10 mg by mouth daily., Disp: , Rfl:  .  cholecalciferol (VITAMIN D) 1000 UNITS tablet, Take 1,000 Units by mouth every morning. , Disp: , Rfl:  .  dabigatran (PRADAXA) 150 MG CAPS capsule, Take 1 capsule (150 mg total) by mouth every 12 (twelve) hours., Disp: 180 capsule, Rfl: 3 .  diazepam (VALIUM) 5 MG tablet, Take 1 tablet (5 mg total) by mouth every 12 (twelve) hours as needed for anxiety or muscle spasms. scheduled, Disp: 30 tablet, Rfl: 0 .  diltiazem (CARDIZEM CD) 240 MG 24 hr capsule, Take 1 capsule (240 mg total) by mouth daily., Disp: 90 capsule, Rfl: 3 .  divalproex (DEPAKOTE) 500 MG 24 hr tablet, Take 1,000 mg by mouth at bedtime. , Disp: , Rfl:  .  docusate sodium (COLACE) 100 MG capsule, Take 200 mg by mouth at bedtime. , Disp: , Rfl:  .  DULoxetine (CYMBALTA) 60 MG capsule, Take 120 mg by mouth daily. , Disp: , Rfl:  .  feeding supplement, ENSURE  ENLIVE, (ENSURE ENLIVE) LIQD, Take 237 mLs by mouth daily., Disp: , Rfl:  .  HYDROcodone-acetaminophen (NORCO) 10-325 MG tablet, Take 1 tablet by mouth 4 (four) times daily., Disp: 10 tablet, Rfl: 0 .  ipratropium-albuterol (DUONEB) 0.5-2.5 (3) MG/3ML SOLN, Take 3 mLs by nebulization every 6 (six) hours., Disp: 360 mL, Rfl: 3 .  lactulose (CHRONULAC) 10 GM/15ML solution, Take 30 mLs (20 g total) by mouth daily., Disp: 900 mL, Rfl: 2 .  losartan (COZAAR) 50 MG tablet, Take 50 mg by mouth at bedtime. , Disp: , Rfl:  .  Maltodextrin-Xanthan Gum (RESOURCE THICKENUP CLEAR) POWD, Take 1 g by mouth as needed., Disp: 10 Can, Rfl: 3 .  metoprolol tartrate (LOPRESSOR) 50 MG tablet, Take 1 tablet (50 mg total) by mouth 2 (two) times daily., Disp: 180 tablet, Rfl: 3 .  montelukast (SINGULAIR) 10 MG tablet, TAKE 1 TABLET AT BEDTIME, Disp: 90 tablet, Rfl: 1 .  nitroGLYCERIN (NITROSTAT) 0.4 MG SL tablet, PLACE 1 TABLET (0.4 MG TOTAL) UNDER THE TONGUE EVERY 5 (FIVE) MINUTES AS NEEDED FOR CHEST PAIN., Disp: 25 tablet, Rfl: 2 .  omeprazole (PRILOSEC) 40 MG capsule, Take 40 mg by mouth 2 (two) times daily. , Disp: , Rfl:  .  ondansetron (ZOFRAN) 4 MG tablet, 4 mg once. , Disp: , Rfl:  .  OXYGEN, Inhale 2.5 L into the lungs. At bedtime and prn throughout day., Disp: , Rfl:  .  polyethylene glycol (MIRALAX / GLYCOLAX) packet, Take 17 g by mouth every evening. , Disp: , Rfl:  .  predniSONE (DELTASONE) 10 MG tablet, TAKE 1 TABLET (10 MG TOTAL) BY MOUTH DAILY WITH BREAKFAST., Disp: 30 tablet, Rfl: 2 .  primidone (MYSOLINE) 250 MG tablet, TAKE 1 TABLET TWICE DAILY, Disp: 180 tablet, Rfl: 1 .  ranitidine (ZANTAC) 150 MG tablet, Take 300 mg by mouth 2 (two) times daily. , Disp: , Rfl:  .  sodium chloride HYPERTONIC 3 % nebulizer solution, Take by nebulization 2 (two) times daily. (Patient taking differently: Take 2-4 mLs by nebulization daily. ),  Disp: 750 mL, Rfl: 12

## 2016-11-05 NOTE — Patient Instructions (Addendum)
Bronchiectasis with acute exacerbation: Take Levaquin 750 mg daily for the next 7 days We will change the prescription for hypertonic saline to [facility], use it twice a day for the next weeks Keep using your therapy vest twice a day Take prednisone 20 mg daily for 5 days, then resume 10 mg daily Continue taking Pulmicort twice a day Continue taking DuoNeb 4 times a day Let us know if your symptoms do not improve Provide [place] with a sample of mucus  For chronic respiratory failure with hypoxemia: Keep using 2 and half liters of oxygen throughout the day  We will have you follow-up with one of our nurse practitioners next week to review the culture result. Depending on the results we will likely start you on a daily antibiotic such as azithromycin or inhale tobramycin.

## 2016-11-06 ENCOUNTER — Other Ambulatory Visit: Payer: Medicare Other

## 2016-11-06 DIAGNOSIS — J470 Bronchiectasis with acute lower respiratory infection: Secondary | ICD-10-CM | POA: Diagnosis not present

## 2016-11-09 LAB — RESPIRATORY CULTURE OR RESPIRATORY AND SPUTUM CULTURE
MICRO NUMBER: 81073976
RESULT:: NORMAL
SPECIMEN QUALITY:: ADEQUATE

## 2016-11-10 ENCOUNTER — Other Ambulatory Visit: Payer: Self-pay | Admitting: *Deleted

## 2016-11-10 MED ORDER — DABIGATRAN ETEXILATE MESYLATE 150 MG PO CAPS: 150.0000 mg | ORAL_CAPSULE | Freq: Two times a day (BID) | ORAL | 1 refills | Status: AC

## 2016-11-10 NOTE — Telephone Encounter (Signed)
Patients wife called and requested a one week refill on pradaxa for the patient be [facility]. Thanks, MI

## 2016-11-10 NOTE — Telephone Encounter (Signed)
Age 71 years Wt 78.95 kg 09/22/2016 Saw Dr Tamala Julian on 05/21/2016  10/06/2016 Hgb 13.5 HCT 36.9  SrCr 0.70 CrCl 108.08 Refill done for Pradaxa 150 mg q 12 hours as requested

## 2016-11-11 NOTE — Progress Notes (Addendum)
[facility]    Chief Complaint  Patient presents with  . Follow-up    bronchiectasis, no concerns today     Current Outpatient Prescriptions on File Prior to Visit  Medication Sig  . ARIPiprazole (ABILIFY) 10 MG tablet Take 5 mg by mouth daily.   Marland Kitchen aspirin-acetaminophen-caffeine (EXCEDRIN MIGRAINE) 250-250-65 MG tablet Take 2 tablets by mouth 3 (three) times daily.   Marland Kitchen atorvastatin (LIPITOR) 20 MG tablet TAKE 1 TABLET (20 MG TOTAL) BY MOUTH DAILY AT 6 PM.  . budesonide (PULMICORT) 0.25 MG/2ML nebulizer solution Take 4 mLs (0.5 mg total) by nebulization 2 (two) times daily.  . cetirizine (ZYRTEC) 10 MG tablet Take 10 mg by mouth daily.  . cholecalciferol (VITAMIN D) 1000 UNITS tablet Take 1,000 Units by mouth every morning.   . dabigatran (PRADAXA) 150 MG CAPS capsule Take 1 capsule (150 mg total) by mouth every 12 (twelve) hours.  . diazepam (VALIUM) 5 MG tablet Take 1 tablet (5 mg total) by mouth every 12 (twelve) hours as needed for anxiety or muscle spasms. scheduled  . divalproex (DEPAKOTE) 500 MG 24 hr tablet Take 1,000 mg by mouth at bedtime.   . docusate sodium (COLACE) 100 MG capsule Take 200 mg by mouth at bedtime.   . DULoxetine (CYMBALTA) 60 MG capsule Take 120 mg by mouth daily.   . feeding supplement, ENSURE ENLIVE, (ENSURE ENLIVE) LIQD Take 237 mLs by mouth daily.  Marland Kitchen HYDROcodone-acetaminophen (NORCO) 10-325 MG tablet Take 1 tablet by mouth 4 (four) times daily.  Marland Kitchen ipratropium-albuterol (DUONEB) 0.5-2.5 (3) MG/3ML SOLN Take 3 mLs by nebulization every 6 (six) hours.  Marland Kitchen lactulose (CHRONULAC) 10 GM/15ML solution Take 30 mLs (20 g total) by mouth daily.  Marland Kitchen losartan (COZAAR) 50 MG tablet Take 50 mg by mouth at bedtime.   . Maltodextrin-Xanthan Gum (RESOURCE THICKENUP CLEAR) POWD Take 1 g by mouth as needed.  . montelukast (SINGULAIR) 10 MG tablet TAKE 1 TABLET AT BEDTIME  . nitroGLYCERIN (NITROSTAT) 0.4 MG SL tablet PLACE 1 TABLET (0.4 MG TOTAL) UNDER THE TONGUE EVERY 5  (FIVE) MINUTES AS NEEDED FOR CHEST PAIN.  Marland Kitchen omeprazole (PRILOSEC) 40 MG capsule Take 40 mg by mouth 2 (two) times daily.   . ondansetron (ZOFRAN) 4 MG tablet 4 mg once.   . OXYGEN Inhale 2.5 L into the lungs. At bedtime and prn throughout day.  . polyethylene glycol (MIRALAX / GLYCOLAX) packet Take 17 g by mouth every evening.   . predniSONE (DELTASONE) 10 MG tablet TAKE 1 TABLET (10 MG TOTAL) BY MOUTH DAILY WITH BREAKFAST.  Marland Kitchen primidone (MYSOLINE) 250 MG tablet TAKE 1 TABLET TWICE DAILY  . ranitidine (ZANTAC) 150 MG tablet Take 300 mg by mouth 2 (two) times daily.   . sodium chloride HYPERTONIC 3 % nebulizer solution Take by nebulization 2 (two) times daily.  Marland Kitchen diltiazem (CARDIZEM CD) 240 MG 24 hr capsule Take 1 capsule (240 mg total) by mouth daily.  . metoprolol tartrate (LOPRESSOR) 50 MG tablet Take 1 tablet (50 mg total) by mouth 2 (two) times daily.   No current facility-administered medications on file prior to visit.      Past Medical Hx:  has a past medical history of Anemia associated with acute blood loss; Angioedema (10/31/2010); Anxiety; Aspiration pneumonia ([place]) (2010); Asthma; Bacteremia due to Pseudomonas (10/04/2011); Bipolar affective disorder ([place]); C V A / STROKE (02/20/2010); Chronic back pain; Constipation; Coronary artery disease; Depression; Emphysema of lung (HCC); GERD (gastroesophageal reflux disease); Headache, chronic daily; History of  bronchitis; History of colon polyps; History of migraine; History of prostate cancer (2004); Hyperlipidemia; Hypertension; Insomnia; Lung cancer ([place]) (2016); Mood change; Myocardial infarction ([place]) (04/1998); Neuromuscular disorder ([place]) (1998); On home O2; Shortness of breath; Stroke ([place]) (1998); and Tremor.   Past Surgical hx, Allergies, Family hx, Social hx all reviewed.  Vital Signs BP 128/64 (BP Location: Right Arm, Cuff Size: Normal)   Pulse 83   SpO2 92%   History of Present Illness Malik Zuniga is a 71 y.o. 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  He was last seen in the pulmonary office by Dr. [name] on 11/05/16 and was treated for an acute bronchiectasis exacerbation with Levaquin for 7 days, prednisone 20 mg QD for 5 days > then return to 5mg  QD.  A sputum sample was sent which grew normal flora, candida albicans, negative AFB.    He presented to the pulmonary office for follow up of sputum culture / review of symptoms.  He reports feeling very well.  He denies fevers, chills.  Continues to have occasional sputum production with dark brown secretions. He has a friend who uses doxycycline as a suppression and he is very interested in using a suppressive agent.  He states "it worked well for her" and I would like to try it.  He is very pleased that he changed his nebulized hypertonic saline sizing.  Shows nebulizer medications > 70ml inhalation vs 18ml inhalation (67ml size took approximately 2 hours).  He denies fevers, chills, increase in cough or volume of sputum production, lower extremity swelling (with recent prednisone taper).       Physical Exam  General - chronically ill appearing adult M in no acute distress ENT - No sinus tenderness, no oral exudate, no LAN Cardiac - s1s2 regular, no murmur Chest - even/non-labored, lungs bilaterally with occasional rhonchi in bases. No wheeze/rales Back - No focal tenderness Abd - Soft, non-tender Ext - No edema Neuro - Normal strength Skin - No rashes Psych - normal mood, and behavior  Studies: [name] 04/2009:  FEV1 1.65 (47%), FEV1% 49  CT chest 10/2013:  RLL infiltrates CXR 10/2013:  LLL infiltrate +h/o aspiration in the past.  +speech therapy evaluation with recs given.  Admitted multiple times in 2017 for recurrent aspiration pneumonia. Started therapy vest 2017  Hospitalization records from 09/2016  reviewed where he was admitted for encephalopathy, sedating meds were held and he was discharged on hospice  Sleep study: 05/2016 No obstructive sleep apnea, but he had hypoxemia noted nightly.    Chest imaging: March 2018 CT chest images independently reviewed showing what appears to be mucus in the left mainstem bronchus. August 2018 CT chest images independently reviewed showing moderate centrilobular emphysema and upper lobe predominant fashion, right lower lobe nodule noted, no growth, radiation fibrotic changes surrounding  EKG 09/23/16 >> NSR 79, QT 391, ATc 449 LFT's 09/23/16 >> ALT 22 / AST 12  Assessment/Plan: Primary Pulmonary - Dr. [name]   Discussion:  71 y/o M with PMH of bronchiectasis with frequent exacerbations who presented to the pulmonary office for follow up of after recent exacerbation.  He has had multiple flares of bronchiectasis in the last year requiring hospitalization.    Bronchiectasis COPD  Plan: Stressed importance of chest vest / flutter valve  Begin azithromycin 250mg  PO QD as suppressive therapy  Bring back in 3 months to see Dr. [name] with EKG and LFT's (Jan 2019) Continue nebulized saline   Continue dysphagia diet  Reviewed risks of chronic antibiotic use with patient and wife EKG reviewed, as above   Patient Instructions  1.  Continue to use her chest vest / flutter valve > this is vitally important for secretion clearance and keeping you out of the hospital.   2.  Continue nebulized saline  3.  We will begin Azithromycin 250mg  by mouth daily as a suppressive agent.    4.  Continue your dysphagia diet, caution with swallowing.    5.  Follow up with Dr. Waunita Schooner in 3 months  6.  Call if you have new or worsening symptoms.    [name], NP-C [facility]  [phone] 11/12/2016, 4:52 PM

## 2016-11-12 ENCOUNTER — Ambulatory Visit (INDEPENDENT_AMBULATORY_CARE_PROVIDER_SITE_OTHER): Payer: Medicare Other | Admitting: Pulmonary Disease

## 2016-11-12 ENCOUNTER — Encounter: Payer: Self-pay | Admitting: Pulmonary Disease

## 2016-11-12 ENCOUNTER — Telehealth: Payer: Self-pay | Admitting: Pulmonary Disease

## 2016-11-12 VITALS — BP 128/64 | HR 83

## 2016-11-12 DIAGNOSIS — J449 Chronic obstructive pulmonary disease, unspecified: Secondary | ICD-10-CM

## 2016-11-12 DIAGNOSIS — J479 Bronchiectasis, uncomplicated: Secondary | ICD-10-CM | POA: Diagnosis not present

## 2016-11-12 DIAGNOSIS — R1311 Dysphagia, oral phase: Secondary | ICD-10-CM | POA: Diagnosis not present

## 2016-11-12 DIAGNOSIS — I25118 Atherosclerotic heart disease of native coronary artery with other forms of angina pectoris: Secondary | ICD-10-CM | POA: Diagnosis not present

## 2016-11-12 MED ORDER — AZITHROMYCIN 250 MG PO TABS: 250.0000 mg | ORAL_TABLET | Freq: Every day | ORAL | 6 refills | Status: DC

## 2016-11-12 NOTE — Telephone Encounter (Signed)
Spoke with pharmacist Caryl Pina, states that there's a drug interaction with pt's pradaxa and daily azithromycin prescribed today- increased risk of bleeding.  Caryl Pina is requesting clarification before prescribing this.    Sending to DOD for clarification as Velna Hatchet has already left for the morning.  PM please advise if ok to override warning. Thanks.

## 2016-11-12 NOTE — Telephone Encounter (Signed)
Spoke with pharmacist, states that same drug interaction happens with pradaxa and xarelto.  Pharmacist states it would be easier to change to a different abx than to change anticoagulant.    Will route to BQ to address on 10/4 for further follow-up.  Please advise.  Thanks!

## 2016-11-12 NOTE — Patient Instructions (Signed)
1.  Continue to use her chest vest / flutter valve > this is vitally important for secretion clearance and keeping you out of the hospital.   2.  Continue nebulized saline  3.  We will begin Azithromycin 250mg  by mouth daily as a suppressive agent.    4.  Continue your dysphagia diet, caution with swallowing.    5.  Follow up with Dr. Waunita Schooner in 3 months  6.  Call if you have new or worsening symptoms.

## 2016-11-12 NOTE — Telephone Encounter (Signed)
Would not override the warning since the plan is to use azithromycin long term.  Looks like azithro can increase the levels of pradaxa. Please ask the pharmacy is we can try alternative anticoagulant or lower dose of pradaxa.  Marshell Garfinkel MD [facility] 11/12/2016, 1:02 PM

## 2016-11-13 ENCOUNTER — Other Ambulatory Visit: Payer: Self-pay | Admitting: Pulmonary Disease

## 2016-11-13 NOTE — Progress Notes (Signed)
Called Mr. Turvey to speak to him regarding the pharmacy message.  I personally called [facility] and reviewed the interactions with the pharmacist.  I also reviewed all of his medications with the hospital pharmacist > there are no interactions for pradaxa and azithromycin.  However, he is on several medications that could prolong QTc.  [facility] not actually [facility] that it is an interaction with pradaxa ( it blocked the prescription but did not say why per the pharmacist).  Will proceed with azithromycin.  Called Mr. Malizia back and reviewed risks of prolonged QTc (VT, syncope, cardiac arrest) with his wife / patient.    [name], NP-C [facility] Pgr: [phone] or if no answer [phone] 11/13/2016, 11:55 AM

## 2016-11-13 NOTE — Telephone Encounter (Signed)
Copied from Brandi's documentation earlier today: ---------------- Called Mr. Appleby to speak to him regarding the pharmacy message.  I personally called [facility] and reviewed the interactions with the pharmacist.  I also reviewed all of his medications with the hospital pharmacist > there are no interactions for pradaxa and azithromycin.  However, he is on several medications that could prolong QTc.  [facility] not actually state that it is an interaction with pradaxa ( it blocked the prescription but did not say why per the pharmacist).  Will proceed with azithromycin.  Called Mr. Krutz back and reviewed risks of prolonged QTc (VT, syncope, cardiac arrest) with his wife / patient.  --------------  Spoke with Velna Hatchet, states that she has already discussed this with pharmacist and pt.  Nothing further needed, will close encounter.

## 2016-11-13 NOTE — Progress Notes (Signed)
Reviewed, agree 

## 2016-11-19 ENCOUNTER — Encounter: Payer: Self-pay | Admitting: Family Medicine

## 2016-11-19 MED ORDER — ONDANSETRON HCL 4 MG PO TABS: 4.0000 mg | ORAL_TABLET | Freq: Three times a day (TID) | ORAL | 6 refills | Status: AC | PRN

## 2016-11-25 ENCOUNTER — Telehealth: Payer: Self-pay | Admitting: Pulmonary Disease

## 2016-11-25 MED ORDER — PREDNISONE 10 MG PO TABS: ORAL_TABLET | ORAL | 0 refills | Status: DC

## 2016-11-25 MED ORDER — LEVOFLOXACIN 750 MG PO TABS: 750.0000 mg | ORAL_TABLET | Freq: Every day | ORAL | 0 refills | Status: DC

## 2016-11-25 NOTE — Telephone Encounter (Signed)
Spoke with Tammy. She stated that she was with the patient today and thinks he may have the beginning of pneumonia. He has a productive cough with dark brown, thick mucus. Increased SOB. She checked his O2 today and he was maintaining around 90% with oxygen.   She stated that he was prescribed azithromycin 250mg  once a day on 11/12/16.   She is requesting another antibiotic and prednisone.   [facility] [address]   BQ, please advise. Thanks!

## 2016-11-25 NOTE — Telephone Encounter (Signed)
levaquin 750mg  daily x 7 days, prednisone 20mg  daily x 7 days Call if no improvement

## 2016-11-25 NOTE — Telephone Encounter (Signed)
Spoke with Tammy. She is aware of BQ's recs. Will go ahead and call in these RXs. Nothing else needed at time of call.

## 2016-11-27 DIAGNOSIS — J449 Chronic obstructive pulmonary disease, unspecified: Secondary | ICD-10-CM | POA: Diagnosis not present

## 2016-12-02 ENCOUNTER — Other Ambulatory Visit: Payer: Self-pay | Admitting: Pulmonary Disease

## 2016-12-05 LAB — FUNGUS CULTURE W SMEAR
MICRO NUMBER: 81073959
SPECIMEN QUALITY: ADEQUATE

## 2016-12-10 ENCOUNTER — Telehealth: Payer: Self-pay | Admitting: Interventional Cardiology

## 2016-12-10 NOTE — Telephone Encounter (Signed)
Received a call from patient.He stated he has afib.Today much worse,heart beating fast and fluttering.He is sob and feels bad.Advised he needs to go to ED.

## 2016-12-10 NOTE — Telephone Encounter (Signed)
New Message     STAT if HR is under 50 or over 120 (normal HR is 60-100 beats per minute)  1) What is your heart rate? Doesn't know   2) Do you have a log of your heart rate readings (document readings)?  No he has not   3) Do you have any other symptoms? Heart racing and flutter , what can he take?

## 2016-12-15 ENCOUNTER — Encounter (HOSPITAL_COMMUNITY): Payer: Self-pay

## 2016-12-15 ENCOUNTER — Telehealth: Payer: Self-pay | Admitting: Family Medicine

## 2016-12-15 DIAGNOSIS — J988 Other specified respiratory disorders: Secondary | ICD-10-CM | POA: Diagnosis not present

## 2016-12-15 DIAGNOSIS — I2510 Atherosclerotic heart disease of native coronary artery without angina pectoris: Secondary | ICD-10-CM | POA: Diagnosis not present

## 2016-12-15 DIAGNOSIS — I110 Hypertensive heart disease with heart failure: Secondary | ICD-10-CM | POA: Insufficient documentation

## 2016-12-15 DIAGNOSIS — Z87891 Personal history of nicotine dependence: Secondary | ICD-10-CM | POA: Diagnosis not present

## 2016-12-15 DIAGNOSIS — R0602 Shortness of breath: Secondary | ICD-10-CM | POA: Diagnosis not present

## 2016-12-15 DIAGNOSIS — Z79899 Other long term (current) drug therapy: Secondary | ICD-10-CM | POA: Diagnosis not present

## 2016-12-15 DIAGNOSIS — Z955 Presence of coronary angioplasty implant and graft: Secondary | ICD-10-CM | POA: Insufficient documentation

## 2016-12-15 DIAGNOSIS — I5042 Chronic combined systolic (congestive) and diastolic (congestive) heart failure: Secondary | ICD-10-CM | POA: Diagnosis not present

## 2016-12-15 DIAGNOSIS — J449 Chronic obstructive pulmonary disease, unspecified: Secondary | ICD-10-CM | POA: Diagnosis not present

## 2016-12-15 DIAGNOSIS — J45909 Unspecified asthma, uncomplicated: Secondary | ICD-10-CM | POA: Diagnosis not present

## 2016-12-15 DIAGNOSIS — R42 Dizziness and giddiness: Secondary | ICD-10-CM | POA: Diagnosis present

## 2016-12-15 NOTE — ED Triage Notes (Signed)
Pt from home c/o cold symptoms x2 weeks. He states, however, that his symptoms have worsened the last 2 days. He is reporting dizziness and weakness and "heart fluttering" for the last 2 days. He has a hx of a fib and aspiration pneumonia that "puts him in the hospital about every 6 months." A&Ox4. Pt uses a wheelchair. Denies N/V/D.

## 2016-12-16 ENCOUNTER — Emergency Department (HOSPITAL_COMMUNITY): Payer: Medicare Other

## 2016-12-16 ENCOUNTER — Emergency Department (HOSPITAL_COMMUNITY)
Admission: EM | Admit: 2016-12-16 | Discharge: 2016-12-16 | Disposition: A | Discharge status: Home / Self Care | Payer: Medicare Other | Attending: Emergency Medicine | Admitting: Emergency Medicine

## 2016-12-16 DIAGNOSIS — J988 Other specified respiratory disorders: Secondary | ICD-10-CM

## 2016-12-16 DIAGNOSIS — R0602 Shortness of breath: Secondary | ICD-10-CM | POA: Diagnosis not present

## 2016-12-16 LAB — CBC
HEMATOCRIT: 43.8 % (ref 39.0–52.0)
Hemoglobin: 14.8 g/dL (ref 13.0–17.0)
MCH: 30.7 pg (ref 26.0–34.0)
MCHC: 33.8 g/dL (ref 30.0–36.0)
MCV: 90.9 fL (ref 78.0–100.0)
Platelets: 271 10*3/uL (ref 150–400)
RBC: 4.82 MIL/uL (ref 4.22–5.81)
RDW: 15.2 % (ref 11.5–15.5)
WBC: 6.6 10*3/uL (ref 4.0–10.5)

## 2016-12-16 LAB — BASIC METABOLIC PANEL
ANION GAP: 12 (ref 5–15)
BUN: 15 mg/dL (ref 6–20)
CHLORIDE: 96 mmol/L — AB (ref 101–111)
CO2: 27 mmol/L (ref 22–32)
Calcium: 9.4 mg/dL (ref 8.9–10.3)
Creatinine, Ser: 0.85 mg/dL (ref 0.61–1.24)
Glucose, Bld: 101 mg/dL — ABNORMAL HIGH (ref 65–99)
POTASSIUM: 4.3 mmol/L (ref 3.5–5.1)
SODIUM: 135 mmol/L (ref 135–145)

## 2016-12-16 LAB — I-STAT TROPONIN, ED: Troponin i, poc: 0.01 ng/mL (ref 0.00–0.08)

## 2016-12-16 MED ORDER — LEVOFLOXACIN 750 MG PO TABS: 750.0000 mg | ORAL_TABLET | Freq: Every day | ORAL | 0 refills | Status: DC

## 2016-12-16 MED ORDER — PREDNISONE 50 MG PO TABS: 50.0000 mg | ORAL_TABLET | Freq: Every day | ORAL | 0 refills | Status: DC

## 2016-12-16 MED ORDER — LEVOFLOXACIN 750 MG PO TABS
750.0000 mg | ORAL_TABLET | Freq: Once | ORAL | Status: AC
  Administered 2016-12-16: 750 mg via ORAL
  Filled 2016-12-16: qty 1

## 2016-12-16 MED ORDER — PREDNISONE 20 MG PO TABS
60.0000 mg | ORAL_TABLET | Freq: Once | ORAL | Status: AC
  Administered 2016-12-16: 60 mg via ORAL
  Filled 2016-12-16: qty 3

## 2016-12-16 NOTE — Discharge Instructions (Signed)
Talk with your lung doctor about how he wants you to take prednisone once the prescription today is done.

## 2016-12-16 NOTE — ED Provider Notes (Signed)
[facility] DEPT Provider Note   CSN: [id number] Arrival date & time: 12/15/16  2330     History   Chief Complaint Chief Complaint  Patient presents with  . Dizziness  . Irregular Heart Beat    HPI Malik Zuniga is a 71 y.o. male.  The history is provided by the patient and the spouse.  He has a history of lung cancer, COPD, neuromuscular disease and has frequent episodes of pneumonia.  He was treated about 3 weeks ago for possible pneumonia with a one-week course of levofloxacin.  He has been on continuous azithromycin for over a month.  Over the last 2 weeks, he has had cold symptoms with rhinorrhea and slight cough.  He has not noticed any dyspnea.  There has been chills, but no fever or sweats.  He denies arthralgias or myalgias.  Over the last 3 days, he has felt weak and dizzy and had noted some episodes of his heart racing.  He does have history of paroxysmal atrial fibrillation.  He talked with his primary care provider advised he come to the ED.  Past Medical History:  Diagnosis Date  . Anemia associated with acute blood loss   . Angioedema 10/31/2010  . Anxiety    takes Valium daily  . Aspiration pneumonia ([place]) 2010  . Asthma   . Bacteremia due to Pseudomonas 10/04/2011   D/t gram neg rods   . Bipolar affective disorder ([place])   . 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 Chronic back pain    compression fracture  . Constipation    takes Colace daily as well as Miralax  . Coronary artery disease   . Depression    takes Cymbalta daily  . Emphysema of lung (HCC)    Albuterol as needed;Symbicort daily and Singulair at bedtime  . GERD (gastroesophageal reflux disease)    takes Omeprazole daily  . Headache, chronic daily   . History of bronchitis   . History of colon polyps   . History of migraine    last migraine a couple of days ago;takes Excedrin Migraine  . History of prostate cancer 2004  .  Hyperlipidemia    takes Simvastatin daily  . Hypertension    takes Metoprolol daily as well as Hyzaar  . Insomnia    takes Benadryl nightly  . Lung cancer ([place]) 2016  . Mood change    after Brain surgery mood changed and was placed on Depakote  . Myocardial infarction ([place]) 04/1998  . Neuromuscular disorder ([place]) 1998   right carpal tunnel release  . On home O2   . Shortness of breath    with exertion  . 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 Tremor     Patient Active Problem List   Diagnosis Date Noted  . Altered mental status 09/22/2016  . Hyperammonemia ([place]) 09/22/2016  . Hypotension 07/28/2016  . Severe sepsis ([place]) 07/28/2016  . Aspiration pneumonia ([place]) 07/28/2016  . 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 Chronic combined systolic (congestive) and diastolic (congestive) heart failure ([place]) 07/28/2016  . Chest pain 07/28/2016  . Chronic anticoagulation 11/01/2015  . Cerebrovascular disease, arteriosclerotic, post-stroke 08/07/2015  . Acute delirium 08/07/2015  . Paroxysmal atrial fibrillation (HCC)   . Diplopia   . COPD (chronic obstructive pulmonary disease) ([place])   . Bronchiectasis without acute exacerbation ([place]) 08/02/2015  . Atrial flutter  with rapid ventricular response ([place]) 08/02/2015  . Tinea cruris 06/05/2015  . Dysphagia 02/01/2015  . Primary cancer of right lower lobe of lung ([place]) 12/05/2014  . CAD (coronary artery disease) 07/27/2013  . Thoracic compression fracture ([place]) 06/21/2013  . Thoracic spine fracture ([place]) 06/21/2013  . Giant comedone 11/04/2012  . Chronic lower back pain 07/22/2012  . Prostate cancer ([place]) 04/22/2012  . 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 Hyperlipidemia 02/13/2010  . Bipolar disorder ([place]) 02/13/2010  . DEPRESSION 02/13/2010  . Essential hypertension 02/13/2010  . TREMOR 02/13/2010  . Hx of TIA (transient ischemic attack) and stroke 02/13/2010    Past Surgical History:  Procedure  Laterality Date  . BACK SURGERY  08/2004; 02/2005; 04/2006; 06/2007; 7/20101   all by Dr. Annette Stable  . BACK SURGERY  05/2013  . BRAIN SURGERY  1999   clip aneurysm  . [place]   right  . CHOLECYSTECTOMY  1996  . COLONOSCOPY    . CORONARY ANGIOPLASTY WITH STENT PLACEMENT  2000   RCA stent, Dr. Rollene Fare; denies stent for PAD 06/15/13  . CORONARY ANGIOPLASTY WITH STENT PLACEMENT  04/1998  . CRANIOTOMY  1999   to clip aneurism, Dr. Annette Stable  . DG BIOPSY LUNG    . GASTROSTOMY W/ FEEDING TUBE  2008   Dr. Watt Climes; only had for a few months  . HAND SURGERY  1989   crushed thumb, Dr. Fredna Dow  . PROSTATECTOMY  04/2001   removal of prostate cancer, Dr. Janice Norrie  . SPINE SURGERY    . [place]   left arm, Dr. Fredna Dow       Home Medications    Prior to Admission medications   Medication Sig Start Date End Date Taking? Authorizing Provider  ARIPiprazole (ABILIFY) 10 MG tablet Take 5 mg by mouth daily.  05/09/14   [provider]  aspirin-acetaminophen-caffeine (EXCEDRIN MIGRAINE) [phone] MG tablet Take 2 tablets by mouth 3 (three) times daily.     [provider]  atorvastatin (LIPITOR) 20 MG tablet TAKE 1 TABLET (20 MG TOTAL) BY MOUTH DAILY AT 6 PM. 10/03/16   Belva Crome, MD  azithromycin (ZITHROMAX) 250 MG tablet Take 1 tablet (250 mg total) by mouth daily. 11/12/16   Donita Brooks, NP  budesonide (PULMICORT) 0.25 MG/2ML nebulizer solution Take 4 mLs (0.5 mg total) by nebulization 2 (two) times daily. 12/07/13   ClanceArmando Reichert, MD  cetirizine (ZYRTEC) 10 MG tablet Take 10 mg by mouth daily.    [provider]  cholecalciferol (VITAMIN D) 1000 UNITS tablet Take 1,000 Units by mouth every morning.     [provider]  dabigatran (PRADAXA) 150 MG CAPS capsule Take 1 capsule (150 mg total) by mouth every 12 (twelve) hours. 11/10/16   Belva Crome, MD  diazepam (VALIUM) 5 MG tablet Take 1 tablet (5 mg total) by mouth every 12 (twelve) hours  as needed for anxiety or muscle spasms. scheduled 09/24/16   Regalado, Belkys A, MD  diltiazem (CARDIZEM CD) 240 MG 24 hr capsule Take 1 capsule (240 mg total) by mouth daily. 08/05/16 11/05/16  Daune Perch, NP  divalproex (DEPAKOTE) 500 MG 24 hr tablet Take 1,000 mg by mouth at bedtime.     [provider]  docusate sodium (COLACE) 100 MG capsule Take 200 mg by mouth at bedtime.     [provider]  DULoxetine (CYMBALTA) 60 MG capsule Take 120 mg by mouth daily.  [provider]  feeding supplement, ENSURE ENLIVE, (ENSURE ENLIVE) LIQD Take 237 mLs by mouth daily.    [provider]  HYDROcodone-acetaminophen (NORCO) 10-325 MG tablet Take 1 tablet by mouth 4 (four) times daily. 09/24/16   Regalado, Belkys A, MD  ipratropium-albuterol (DUONEB) 0.5-2.5 (3) MG/3ML SOLN Take 3 mLs by nebulization every 6 (six) hours. 12/07/13   [place], Armando Reichert, MD  lactulose (CHRONULAC) 10 GM/15ML solution Take 30 mLs (20 g total) by mouth daily. 10/23/16   [name], MD  levofloxacin (LEVAQUIN) 750 MG tablet Take 1 tablet (750 mg total) by mouth daily. 11/25/16   [name], MD  losartan (COZAAR) 50 MG tablet Take 50 mg by mouth at bedtime.     [provider]  Maltodextrin-Xanthan Gum (RESOURCE THICKENUP CLEAR) POWD Take 1 g by mouth as needed. 01/24/16   Eugenie Filler, MD  metoprolol tartrate (LOPRESSOR) 50 MG tablet Take 1 tablet (50 mg total) by mouth 2 (two) times daily. 08/05/16 11/05/16  Daune Perch, NP  montelukast (SINGULAIR) 10 MG tablet TAKE 1 TABLET AT BEDTIME 07/29/16   [name], MD  nitroGLYCERIN (NITROSTAT) 0.4 MG SL tablet PLACE 1 TABLET (0.4 MG TOTAL) UNDER THE TONGUE EVERY 5 (FIVE) MINUTES AS NEEDED FOR CHEST PAIN. 08/05/16   Daune Perch, NP  omeprazole (PRILOSEC) 40 MG capsule Take 40 mg by mouth 2 (two) times daily.     [provider]  ondansetron (ZOFRAN) 4 MG tablet Take 1 tablet (4 mg total) by mouth  every 8 (eight) hours as needed for nausea or vomiting. 11/19/16   [name], MD  OXYGEN Inhale 2.5 L into the lungs. At bedtime and prn throughout day.    [provider]  polyethylene glycol (MIRALAX / GLYCOLAX) packet Take 17 g by mouth every evening.     [provider]  predniSONE (DELTASONE) 10 MG tablet Take 2 tablets daily for the next 7 days. 11/25/16   [name], MD  predniSONE (DELTASONE) 10 MG tablet TAKE 1 TABLET (10 MG TOTAL) BY MOUTH DAILY WITH BREAKFAST. 12/02/16   [name], MD  primidone (MYSOLINE) 250 MG tablet TAKE 1 TABLET TWICE DAILY 07/29/16   [name], MD  ranitidine (ZANTAC) 150 MG tablet Take 300 mg by mouth 2 (two) times daily.     [provider]  sodium chloride HYPERTONIC 3 % nebulizer solution Take by nebulization 2 (two) times daily. 11/05/16   [name], MD    Family History Family History  Problem Relation Age of Onset  . Heart disease Mother   . Cancer Father        brain cancer and prostate cancer     Social History Social History   Tobacco Use  . Smoking status: Former Smoker    Packs/day: 1.50    Years: 52.00    Pack years: 78.00    Types: Cigarettes    Last attempt to quit: 07/17/2012    Years since quitting: 4.4  . Smokeless tobacco: Never Used  . Tobacco comment: Former smoker  Substance Use Topics  . Alcohol use: No    Alcohol/week: 0.0 oz  . Drug use: No     Allergies   Lisinopril; Lamictal [lamotrigine]; and Ambien [zolpidem]   Review of Systems Review of Systems  All other systems reviewed and are negative.    Physical Exam Updated Vital Signs BP 130/89 (BP Location: Left Arm)   Pulse 90  Temp 98.2 F (36.8 C) (Oral)   Resp 18   SpO2 (!) 88%   Physical Exam  Nursing note reviewed.  71 year old male, resting comfortably and in no acute distress. Vital signs are normal. Oxygen saturation is 88%, which is hypoxic. Head is normocephalic and  atraumatic. PERRLA, EOMI. Oropharynx is clear. Neck is nontender and supple without adenopathy or JVD. Back is nontender and there is no CVA tenderness. Lungs are clear without rales, wheezes, or rhonchi. Chest is nontender. Heart has regular rate and rhythm without murmur. Abdomen is soft, flat, nontender without masses or hepatosplenomegaly and peristalsis is normoactive. Extremities have no cyanosis or edema, full range of motion is present. Skin is warm and dry without rash. Neurologic: Awake, alert, oriented.  Speech is markedly dysarthric.  With moderate to severe generalized weakness.  He has difficulty holding his head up.  This is apparently is his baseline.  ED Treatments / Results  Labs (all labs ordered are listed, but only abnormal results are displayed) Labs Reviewed  BASIC METABOLIC PANEL - Abnormal; Notable for the following components:      Result Value   Chloride 96 (*)    Glucose, Bld 101 (*)    All other components within normal limits  CBC  I-STAT TROPONIN, ED    EKG  EKG Interpretation  Date/Time:  Monday December 15 2016 23:47:44 EST Ventricular Rate:  89 PR Interval:    QRS Duration: 81 QT Interval:  358 QTC Calculation: 436 R Axis:   71 Text Interpretation:  Sinus rhythm Low voltage, precordial leads When compared with ECG of 09/22/2016, No significant change was found Confirmed by Delora Fuel ([id number]) on 12/16/2016 12:42:26 AM       Radiology Dg Chest 2 View  Result Date: 12/16/2016 CLINICAL DATA:  Atrial fibrillation shortness of breath EXAM: CHEST  2 VIEW COMPARISON:  09/22/2016 FINDINGS: Limited by habitus and positioning. Kyphosis with partially visualized spinal hardware. No focal consolidation or large effusion. Stable cardiomediastinal silhouette. IMPRESSION: Limited study due to positioning and habitus. No definite acute interval change Electronically Signed   By: Donavan Foil M.D.   On: 12/16/2016 00:26    Procedures Procedures  (including critical care time)  Medications Ordered in ED Medications  levofloxacin (LEVAQUIN) tablet 750 mg (not administered)  predniSONE (DELTASONE) tablet 60 mg (not administered)     Initial Impression / Assessment and Plan / ED Course  I have reviewed the triage vital signs and the nursing notes.  Pertinent labs & imaging results that were available during my care of the patient were reviewed by me and considered in my medical decision making (see chart for details).  Respiratory tract infection.  It is curious that symptoms started to get worse as soon as he completed his course of levofloxacin.  Laboratory workup is reassuring.  ECG shows sinus rhythm.  Chest x-ray here shows no definite infiltrate, but pneumonia is still a possibility.  Pulse oximetry is repeated and is up to 91% which is where he typically runs at home.  Given his comorbidities, I will send him home with another course of levofloxacin as well as a course of prednisone.  He is to follow-up with his pulmonologist in the next week.  Final Clinical Impressions(s) / ED Diagnoses   Final diagnoses:  Respiratory tract infection    ED Discharge Orders        Ordered    levofloxacin (LEVAQUIN) 750 MG tablet  Daily     12/16/16 0231  predniSONE (DELTASONE) 50 MG tablet  Daily     04/24/92 5859       Delora Fuel, MD 29/24/46 [phone]

## 2016-12-16 NOTE — Telephone Encounter (Signed)
Pt in ED now, here is the documentation for this note:   [facility] Patient Name: Malik Zuniga Gender: Male DOB: [birth year] Age: 71 Y 12 M 28 D Return Phone Number: [phone] (Primary), [phone] (Secondary) Address[place] [address] [place] Client Site [facility] - Day Physician Dimple Nanas - MD Contact Type Call Who Is Calling Patient / Member / Family / Caregiver Call Type Triage / Clinical Caller Name Ignacia Marvel Relationship To Patient Spouse Return Phone Number [phone] (Primary) Chief Complaint Dizziness Reason for Call Symptomatic / Request for [place] states that her husband has been feeling worse than normal the last few days. He has been lethargic and weak. He is dizzy and has to use his wheelchair for everything. He has been coughing up some stuff her lately. Appointment Disposition EMR Appointment Not Necessary Info pasted into Epic Yes Translation No Nurse Assessment Nurse: Rene Kocher, RN, [place] Date/Time (Eastern Time): 12/15/2016 4:23:34 PM Confirm and document reason for call. If symptomatic, describe symptoms. ---caller states her husband has been weak, lethargic, dizzy over the past few days-onset Friday. pt has been sleeping more than usual. pt has to use his wheelchair for everything now which is abnormal for him. Does the patient have any new or worsening symptoms? ---Yes Will a triage be completed? ---Yes Related visit to physician within the last 2 weeks? ---No Does the PT have any chronic conditions? (i.e. diabetes, asthma, etc.) ---Yes List chronic conditions. ---COPD, high blood pressure, pt aspirates when he eats, AFib Is this a behavioral health or substance abuse call? ---No Guidelines Guideline Title  Affirmed Question Affirmed Notes Nurse Date/Time (Eastern Time) Dizziness - Lightheadedness SEVERE dizziness (e.g., unable to stand, requires support to walk, feels like passing out now) [place], RN, [facility] 12/15/2016 4:29:17 PM PLEASE NOTE: All timestamps contained within this report are represented as [place] Standard Time. CONFIDENTIALTY NOTICE: This fax transmission is intended only for the addressee. It contains information that is legally privileged, confidential or otherwise protected from use or disclosure. If you are not the intended recipient, you are strictly prohibited from reviewing, disclosing, copying using or disseminating any of this information or taking any action in reliance on or regarding this information. If you have received this fax in error, please notify us immediately by telephone so that we can arrange for its return to [place]. Phone: [phone], Toll-Free: [phone], Fax: [phone] Page: 2 of 2 Call Id: [id number] [place]. Time Eilene Ghazi Time) Disposition Final User 12/15/2016 4:45:45 PM Send To RN [place], RN, [facility] 12/15/2016 4:40:43 PM Go to ED Now (or PCP triage) Yes Rene Kocher, RN, [facility] Caller Disagree/Comply Comply Caller Understands Yes PreDisposition Call Doctor Care Advice Given Per Guideline GO TO ED NOW (OR PCP TRIAGE): DRIVING: Another adult should drive. BRING MEDICINES: * Please bring a list of your current medicines when you go to the Emergency Department (ER). * It is also a good idea to bring the pill bottles too. This will help the doctor to make certain you are taking the right medicines and the right dose. CARE ADVICE given per Dizziness (Adult) guideline. Comments User: [place], [place], RN Date/Time (Eastern Time): 12/15/2016 4:27:04 PM BP earlier today was 122/70 User: [place], [place], RN Date/Time Eilene Ghazi Time): 12/15/2016 4:28:54 PM pt spoke with a nurse on Friday and was told to go to the ER because of  his dizziness and other  symptoms, but patient refused to go to the ER User: [place], [place], RN Date/Time (Eastern Time): 12/15/2016 4:30:44 PM Friday pt was clammy and sweating a lot User: [place], [place], RN Date/Time (Eastern Time): 12/15/2016 4:40:39 PM pt refuses to go to the ER or to UC. pt's wife understands the situation and she said that she will try to get him to go to [place], RN Date/Time (Eastern Time): 12/15/2016 4:42:35 PM appointment not made because patient's condition is life threatening and the office would not be able to see the patient within the next 4 hours due to hours of operation. Pt advised to go to ED NOW or UC NOW. Referrals [facility] [place]

## 2016-12-19 ENCOUNTER — Ambulatory Visit (INDEPENDENT_AMBULATORY_CARE_PROVIDER_SITE_OTHER): Payer: Medicare Other | Admitting: Adult Health

## 2016-12-19 ENCOUNTER — Encounter: Payer: Self-pay | Admitting: Adult Health

## 2016-12-19 DIAGNOSIS — J479 Bronchiectasis, uncomplicated: Secondary | ICD-10-CM | POA: Diagnosis not present

## 2016-12-19 DIAGNOSIS — I25118 Atherosclerotic heart disease of native coronary artery with other forms of angina pectoris: Secondary | ICD-10-CM | POA: Diagnosis not present

## 2016-12-19 DIAGNOSIS — J441 Chronic obstructive pulmonary disease with (acute) exacerbation: Secondary | ICD-10-CM

## 2016-12-19 MED ORDER — PREDNISONE 10 MG PO TABS: ORAL_TABLET | ORAL | 0 refills | Status: AC

## 2016-12-19 NOTE — Assessment & Plan Note (Signed)
Exacerbation, improving on abx   Plan  Patient Instructions  Finish Levaquin as directed.  Taper prednisone as directed.  Continue on VEST and Flutter.  Continue on Pulmicort  And Hypertonic Neb Twice daily   Continue on Duoneb Four times a day  .  Follow up with Dr. [name] in 2 months and As needed   Please contact office for sooner follow up if symptoms do not improve or worsen or seek emergency care

## 2016-12-19 NOTE — Progress Notes (Signed)
@Patient  ID: Malik Zuniga, male    DOB: [birth year], 71 y.o.   MRN: [id number]  Chief Complaint  Patient presents with  . Follow-up    COPD     Referring provider: [name], MD  HPI: 71 yo male followed for COPD , O2 RF , Bronchiectasis and recurrent aspiration PNA  Lung Cancer with adenocarcinoma tx /w SBRT in 2016.   TEST  [name] 04/2009: FEV1 1.65 (47%), FEV1% 49    12/19/2016 Follow up : ER visit , Bronchitis  Patient presents for a follow-up from recent emergency room visit patient went to the emergency room with increased cough and congestion on November 6.  Was treated for an acute bronchitis bronchiectasis exacerbation.. Chest x-ray showed no acute process.. Patient was given Levaquin and a prednisone taper.  Patient is starting to feel better.  Continues to have cough and congestion.  Denies any hemoptysis chest pain orthopnea PND .  Allergies  Allergen Reactions  . Lisinopril Swelling    ANGIOEDEMA  . Lamictal [Lamotrigine] Other (See Comments)    Weakness/difficulty swallowing  . Ambien [Zolpidem] Other (See Comments)    Unknown reaction    Immunization History  Administered Date(s) Administered  . Influenza Split 01/31/2011, 10/30/2011  . Influenza Whole 11/10/2009  . Influenza, High Dose Seasonal PF 12/16/2012  . Influenza,inj,Quad PF,6+ Mos 10/12/2013, 12/12/2014, 10/10/2015, 10/06/2016  . Pneumococcal Conjugate-13 08/23/2013  . Pneumococcal Polysaccharide-23 12/19/2004, 10/30/2016  . Td 05/19/2007  . Zoster 07/11/2008    Past Medical History:  Diagnosis Date  . Anemia associated with acute blood loss   . Angioedema 10/31/2010  . Anxiety    takes Valium daily  . Aspiration pneumonia ([place]) 2010  . Asthma   . Bacteremia due to Pseudomonas 10/04/2011   D/t gram neg rods   . Bipolar affective disorder ([place])   . 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 Chronic back pain    compression fracture  .  Constipation    takes Colace daily as well as Miralax  . Coronary artery disease   . Depression    takes Cymbalta daily  . Emphysema of lung (HCC)    Albuterol as needed;Symbicort daily and Singulair at bedtime  . GERD (gastroesophageal reflux disease)    takes Omeprazole daily  . Headache, chronic daily   . History of bronchitis   . History of colon polyps   . History of migraine    last migraine a couple of days ago;takes Excedrin Migraine  . History of prostate cancer 2004  . Hyperlipidemia    takes Simvastatin daily  . Hypertension    takes Metoprolol daily as well as Hyzaar  . Insomnia    takes Benadryl nightly  . Lung cancer ([place]) 2016  . Mood change    after Brain surgery mood changed and was placed on Depakote  . Myocardial infarction ([place]) 04/1998  . Neuromuscular disorder ([place]) 1998   right carpal tunnel release  . On home O2   . Shortness of breath    with exertion  . Stroke [facility]) 1998   Brain Aneurysm  . Tremor     Tobacco History: Social History   Tobacco Use  Smoking Status Former Smoker  . Packs/day: 1.50  . Years: 52.00  . Pack years: 78.00  . Types: Cigarettes  . Last attempt to quit: 07/17/2012  . Years since quitting: 4.4  Smokeless Tobacco Never Used  Tobacco Comment   Former smoker   Counseling given: Not Answered Comment: Former smoker   Outpatient Encounter Medications as of 12/19/2016  Medication Sig  . ARIPiprazole (ABILIFY) 10 MG tablet Take 5 mg by mouth daily.   Marland Kitchen aspirin-acetaminophen-caffeine (EXCEDRIN MIGRAINE) 250-250-65 MG tablet Take 2 tablets by mouth 3 (three) times daily.   Marland Kitchen atorvastatin (LIPITOR) 20 MG tablet TAKE 1 TABLET (20 MG TOTAL) BY MOUTH DAILY AT 6 PM.  . azithromycin (ZITHROMAX) 250 MG tablet Take 1 tablet (250 mg total) by mouth daily.  . budesonide (PULMICORT) 0.25 MG/2ML nebulizer solution Take 4 mLs (0.5 mg total) by nebulization 2 (two) times daily.  . cetirizine (ZYRTEC) 10 MG tablet Take 10 mg by mouth  daily.  . cholecalciferol (VITAMIN D) 1000 UNITS tablet Take 1,000 Units by mouth every morning.   . dabigatran (PRADAXA) 150 MG CAPS capsule Take 1 capsule (150 mg total) by mouth every 12 (twelve) hours.  . diazepam (VALIUM) 5 MG tablet Take 1 tablet (5 mg total) by mouth every 12 (twelve) hours as needed for anxiety or muscle spasms. scheduled  . divalproex (DEPAKOTE) 500 MG 24 hr tablet Take 1,000 mg by mouth at bedtime.   . docusate sodium (COLACE) 100 MG capsule Take 200 mg by mouth at bedtime.   . DULoxetine (CYMBALTA) 60 MG capsule Take 120 mg by mouth daily.   . feeding supplement, ENSURE ENLIVE, (ENSURE ENLIVE) LIQD Take 237 mLs by mouth daily.  Marland Kitchen HYDROcodone-acetaminophen (NORCO) 10-325 MG tablet Take 1 tablet by mouth 4 (four) times daily.  Marland Kitchen ipratropium-albuterol (DUONEB) 0.5-2.5 (3) MG/3ML SOLN Take 3 mLs by nebulization every 6 (six) hours.  Marland Kitchen lactulose (CHRONULAC) 10 GM/15ML solution Take 30 mLs (20 g total) by mouth daily.  Marland Kitchen levofloxacin (LEVAQUIN) 750 MG tablet Take 1 tablet (750 mg total) daily by mouth.  . losartan (COZAAR) 50 MG tablet Take 50 mg by mouth at bedtime.   . Maltodextrin-Xanthan Gum (RESOURCE THICKENUP CLEAR) POWD Take 1 g by mouth as needed.  . montelukast (SINGULAIR) 10 MG tablet TAKE 1 TABLET AT BEDTIME  . nitroGLYCERIN (NITROSTAT) 0.4 MG SL tablet PLACE 1 TABLET (0.4 MG TOTAL) UNDER THE TONGUE EVERY 5 (FIVE) MINUTES AS NEEDED FOR CHEST PAIN.  Marland Kitchen omeprazole (PRILOSEC) 40 MG capsule Take 40 mg by mouth 2 (two) times daily.   . ondansetron (ZOFRAN) 4 MG tablet Take 1 tablet (4 mg total) by mouth every 8 (eight) hours as needed for nausea or vomiting.  . OXYGEN Inhale 2.5 L into the lungs. At bedtime and prn throughout day.  . polyethylene glycol (MIRALAX / GLYCOLAX) packet Take 17 g by mouth every evening.   . predniSONE (DELTASONE) 50 MG tablet Take 1 tablet (50 mg total) daily by mouth.  . primidone (MYSOLINE) 250 MG tablet TAKE 1 TABLET TWICE DAILY  .  ranitidine (ZANTAC) 150 MG tablet Take 300 mg by mouth 2 (two) times daily.   . sodium chloride HYPERTONIC 3 % nebulizer solution Take by nebulization 2 (two) times daily.  Marland Kitchen diltiazem (CARDIZEM CD) 240 MG 24 hr capsule Take 1 capsule (240 mg total) by mouth daily.  . metoprolol tartrate (LOPRESSOR) 50 MG tablet Take 1 tablet (50 mg total) by mouth 2 (two) times daily.  . predniSONE (DELTASONE) 10 MG tablet 4 tabs for 3 days, then 3 tabs for 3 days, 2 tabs for 3 days, then 1 tab daily   No facility-administered encounter medications on file as of 12/19/2016.  Review of Systems  Constitutional:   No  weight loss, night sweats,  Fevers, chills, fatigue, or  lassitude.  HEENT:   No headaches,  Difficulty swallowing,  Tooth/dental problems, or  Sore throat,                No sneezing, itching, ear ache, nasal congestion, post nasal drip,   CV:  No chest pain,  Orthopnea, PND, swelling in lower extremities, anasarca, dizziness, palpitations, syncope.   GI  No heartburn, indigestion, abdominal pain, nausea, vomiting, diarrhea, change in bowel habits, loss of appetite, bloody stools.   Resp: No shortness of breath with exertion or at rest.  No excess mucus, no productive cough,  No non-productive cough,  No coughing up of blood.  No change in color of mucus.  No wheezing.  No chest wall deformity  Skin: no rash or lesions.  GU: no dysuria, change in color of urine, no urgency or frequency.  No flank pain, no hematuria   MS:  No joint pain or swelling.  No decreased range of motion.  No back pain.    Physical Exam    GEN: A/Ox3; pleasant , NAD, well nourished , elderly    HEENT:  [place]/AT,  EACs-clear, TMs-wnl, NOSE-clear, THROAT-clear, no lesions, no postnasal drip or exudate noted.   NECK:  Supple w/ fair ROM; no JVD; normal carotid impulses w/o bruits; no thyromegaly or nodules palpated; no lymphadenopathy.    RESP  Few rhonchi. no accessory muscle use, no dullness to  percussion  CARD:  RRR, no m/r/g, tr  peripheral edema, pulses intact, no cyanosis or clubbing.  GI:   Soft & nt; nml bowel sounds; no organomegaly or masses detected.   Musco: Warm bil, no deformities or joint swelling noted.   Neuro: alert, no focal deficits noted.    Skin: Warm, no lesions or rashes    Lab Results:  CBC  BMET  BNP  Imaging: Dg Chest 2 View  Result Date: 12/16/2016 CLINICAL DATA:  Atrial fibrillation shortness of breath EXAM: CHEST  2 VIEW COMPARISON:  09/22/2016 FINDINGS: Limited by habitus and positioning. Kyphosis with partially visualized spinal hardware. No focal consolidation or large effusion. Stable cardiomediastinal silhouette. IMPRESSION: Limited study due to positioning and habitus. No definite acute interval change Electronically Signed   By: Donavan Foil M.D.   On: 12/16/2016 00:26     Assessment & Plan:   Bronchiectasis without acute exacerbation (HCC) Exacerbation, improving on abx   Plan  Patient Instructions  Finish Levaquin as directed.  Taper prednisone as directed.  Continue on VEST and Flutter.  Continue on Pulmicort  And Hypertonic Neb Twice daily   Continue on Duoneb Four times a day  .  Follow up with Dr. [name] in 2 months and As needed   Please contact office for sooner follow up if symptoms do not improve or worsen or seek emergency care       COPD (chronic obstructive pulmonary disease) ([place]) Flare - improving with abx and steroids   Plan  Patient Instructions  Finish Levaquin as directed.  Taper prednisone as directed.  Continue on VEST and Flutter.  Continue on Pulmicort  And Hypertonic Neb Twice daily   Continue on Duoneb Four times a day  .  Follow up with Dr. [name] in 2 months and As needed   Please contact office for sooner follow up if symptoms do not improve or worsen or seek emergency care  Rexene Edison, NP 12/19/2016

## 2016-12-19 NOTE — Assessment & Plan Note (Signed)
Flare - improving with abx and steroids   Plan  Patient Instructions  Finish Levaquin as directed.  Taper prednisone as directed.  Continue on VEST and Flutter.  Continue on Pulmicort  And Hypertonic Neb Twice daily   Continue on Duoneb Four times a day  .  Follow up with Dr. [name] in 2 months and As needed   Please contact office for sooner follow up if symptoms do not improve or worsen or seek emergency care

## 2016-12-19 NOTE — Patient Instructions (Addendum)
Finish Levaquin as directed.  Taper prednisone as directed.  Continue on VEST and Flutter.  Continue on Pulmicort  And Hypertonic Neb Twice daily   Continue on Duoneb Four times a day  .  Follow up with Dr. [name] in 2 months and As needed   Please contact office for sooner follow up if symptoms do not improve or worsen or seek emergency care

## 2016-12-23 LAB — MYCOBACTERIA,CULT W/FLUOROCHROME SMEAR
MICRO NUMBER:: 81073971
SMEAR: NONE SEEN
SPECIMEN QUALITY: ADEQUATE

## 2016-12-24 ENCOUNTER — Telehealth: Payer: Self-pay | Admitting: Pulmonary Disease

## 2016-12-24 DIAGNOSIS — S22000D Wedge compression fracture of unspecified thoracic vertebra, subsequent encounter for fracture with routine healing: Secondary | ICD-10-CM | POA: Diagnosis not present

## 2016-12-24 NOTE — Telephone Encounter (Signed)
BQ ok to refer to hospice? He is in palliative care but he is now ready for hospice. Please advise.

## 2016-12-25 ENCOUNTER — Other Ambulatory Visit (HOSPITAL_COMMUNITY): Payer: Self-pay | Admitting: Psychiatry

## 2016-12-25 ENCOUNTER — Telehealth: Payer: Self-pay | Admitting: Family Medicine

## 2016-12-25 NOTE — Telephone Encounter (Signed)
I agree w/ him continuing under the Hospice Physician and I will be here to support

## 2016-12-25 NOTE — Telephone Encounter (Signed)
Spoke with Manus Gunning from Hospice: She states that Mr. Detwiler has been in the Palliative Program since Jan 2018, and he now is requesting full hospice services.  He is growing weak, almost completely wheelchair bound, extensively decreased mobility and is having issues with coughing up Sputum.  He was sent with a DX of COPD.  Pulmonology is aware of this transition to full hospice services, but they wanted PCP to know as well.   As of right now, Manus Gunning is proceeding with the Hospice Physician being the attending for Malik Zuniga, unless Dr. [name] would like to continue that roll.   Manus Gunning is aware that notes are being sent to Dr. [name], and if she wants to be the attending I will call them back and let her know that. Otherwise, he will just remain with the Hospice physician.

## 2016-12-25 NOTE — Telephone Encounter (Signed)
Copied from [place] [id number]. Topic: General - Other >> Dec 25, 2016  9:25 AM Yvette Rack wrote: Reason for CRM:  Manus Gunning from [facility] [phone] wants Dr [name] to know that the patient wants to enroll in Hospice services Ms Manus Gunning also wants to knoew if its ok for pt to do that and if Dr [name] will be the patients attending physician please give Manus Gunning at call at [phone]

## 2016-12-25 NOTE — Telephone Encounter (Signed)
Noted.   Malik Zuniga is aware to call us if they need anything further.

## 2016-12-25 NOTE — Telephone Encounter (Signed)
OK by me 

## 2016-12-25 NOTE — Telephone Encounter (Signed)
Spoke with Tammy, and made her aware of below message.  Nothing further needed.

## 2016-12-26 DIAGNOSIS — R1310 Dysphagia, unspecified: Secondary | ICD-10-CM | POA: Diagnosis not present

## 2016-12-26 DIAGNOSIS — Z9981 Dependence on supplemental oxygen: Secondary | ICD-10-CM | POA: Diagnosis not present

## 2016-12-26 DIAGNOSIS — G8929 Other chronic pain: Secondary | ICD-10-CM | POA: Diagnosis not present

## 2016-12-26 DIAGNOSIS — I4892 Unspecified atrial flutter: Secondary | ICD-10-CM | POA: Diagnosis not present

## 2016-12-26 DIAGNOSIS — M549 Dorsalgia, unspecified: Secondary | ICD-10-CM | POA: Diagnosis not present

## 2016-12-26 DIAGNOSIS — Z85118 Personal history of other malignant neoplasm of bronchus and lung: Secondary | ICD-10-CM | POA: Diagnosis not present

## 2016-12-26 DIAGNOSIS — I110 Hypertensive heart disease with heart failure: Secondary | ICD-10-CM | POA: Diagnosis not present

## 2016-12-26 DIAGNOSIS — I2510 Atherosclerotic heart disease of native coronary artery without angina pectoris: Secondary | ICD-10-CM | POA: Diagnosis not present

## 2016-12-26 DIAGNOSIS — J479 Bronchiectasis, uncomplicated: Secondary | ICD-10-CM | POA: Diagnosis not present

## 2016-12-26 DIAGNOSIS — G43909 Migraine, unspecified, not intractable, without status migrainosus: Secondary | ICD-10-CM | POA: Diagnosis not present

## 2016-12-26 DIAGNOSIS — Z8701 Personal history of pneumonia (recurrent): Secondary | ICD-10-CM | POA: Diagnosis not present

## 2016-12-26 DIAGNOSIS — I4891 Unspecified atrial fibrillation: Secondary | ICD-10-CM | POA: Diagnosis not present

## 2016-12-26 DIAGNOSIS — R251 Tremor, unspecified: Secondary | ICD-10-CM | POA: Diagnosis not present

## 2016-12-26 DIAGNOSIS — Z87891 Personal history of nicotine dependence: Secondary | ICD-10-CM | POA: Diagnosis not present

## 2016-12-26 DIAGNOSIS — J449 Chronic obstructive pulmonary disease, unspecified: Secondary | ICD-10-CM | POA: Diagnosis not present

## 2016-12-26 DIAGNOSIS — I5042 Chronic combined systolic (congestive) and diastolic (congestive) heart failure: Secondary | ICD-10-CM | POA: Diagnosis not present

## 2016-12-26 DIAGNOSIS — I672 Cerebral atherosclerosis: Secondary | ICD-10-CM | POA: Diagnosis not present

## 2016-12-28 DIAGNOSIS — J479 Bronchiectasis, uncomplicated: Secondary | ICD-10-CM | POA: Diagnosis not present

## 2016-12-28 DIAGNOSIS — Z9981 Dependence on supplemental oxygen: Secondary | ICD-10-CM | POA: Diagnosis not present

## 2016-12-28 DIAGNOSIS — I110 Hypertensive heart disease with heart failure: Secondary | ICD-10-CM | POA: Diagnosis not present

## 2016-12-28 DIAGNOSIS — J449 Chronic obstructive pulmonary disease, unspecified: Secondary | ICD-10-CM | POA: Diagnosis not present

## 2016-12-28 DIAGNOSIS — R1310 Dysphagia, unspecified: Secondary | ICD-10-CM | POA: Diagnosis not present

## 2016-12-28 DIAGNOSIS — I5042 Chronic combined systolic (congestive) and diastolic (congestive) heart failure: Secondary | ICD-10-CM | POA: Diagnosis not present

## 2016-12-29 DIAGNOSIS — I110 Hypertensive heart disease with heart failure: Secondary | ICD-10-CM | POA: Diagnosis not present

## 2016-12-29 DIAGNOSIS — I5042 Chronic combined systolic (congestive) and diastolic (congestive) heart failure: Secondary | ICD-10-CM | POA: Diagnosis not present

## 2016-12-29 DIAGNOSIS — Z9981 Dependence on supplemental oxygen: Secondary | ICD-10-CM | POA: Diagnosis not present

## 2016-12-29 DIAGNOSIS — R1310 Dysphagia, unspecified: Secondary | ICD-10-CM | POA: Diagnosis not present

## 2016-12-29 DIAGNOSIS — J479 Bronchiectasis, uncomplicated: Secondary | ICD-10-CM | POA: Diagnosis not present

## 2016-12-29 DIAGNOSIS — J449 Chronic obstructive pulmonary disease, unspecified: Secondary | ICD-10-CM | POA: Diagnosis not present

## 2016-12-30 DIAGNOSIS — J449 Chronic obstructive pulmonary disease, unspecified: Secondary | ICD-10-CM | POA: Diagnosis not present

## 2016-12-30 DIAGNOSIS — R1310 Dysphagia, unspecified: Secondary | ICD-10-CM | POA: Diagnosis not present

## 2016-12-30 DIAGNOSIS — I5042 Chronic combined systolic (congestive) and diastolic (congestive) heart failure: Secondary | ICD-10-CM | POA: Diagnosis not present

## 2016-12-30 DIAGNOSIS — Z9981 Dependence on supplemental oxygen: Secondary | ICD-10-CM | POA: Diagnosis not present

## 2016-12-30 DIAGNOSIS — I110 Hypertensive heart disease with heart failure: Secondary | ICD-10-CM | POA: Diagnosis not present

## 2016-12-30 DIAGNOSIS — J479 Bronchiectasis, uncomplicated: Secondary | ICD-10-CM | POA: Diagnosis not present

## 2016-12-31 DIAGNOSIS — I5042 Chronic combined systolic (congestive) and diastolic (congestive) heart failure: Secondary | ICD-10-CM | POA: Diagnosis not present

## 2016-12-31 DIAGNOSIS — Z9981 Dependence on supplemental oxygen: Secondary | ICD-10-CM | POA: Diagnosis not present

## 2016-12-31 DIAGNOSIS — J449 Chronic obstructive pulmonary disease, unspecified: Secondary | ICD-10-CM | POA: Diagnosis not present

## 2016-12-31 DIAGNOSIS — R1310 Dysphagia, unspecified: Secondary | ICD-10-CM | POA: Diagnosis not present

## 2016-12-31 DIAGNOSIS — I110 Hypertensive heart disease with heart failure: Secondary | ICD-10-CM | POA: Diagnosis not present

## 2016-12-31 DIAGNOSIS — J479 Bronchiectasis, uncomplicated: Secondary | ICD-10-CM | POA: Diagnosis not present

## 2017-01-02 ENCOUNTER — Emergency Department (HOSPITAL_COMMUNITY)

## 2017-01-02 ENCOUNTER — Encounter (HOSPITAL_COMMUNITY): Payer: Self-pay | Admitting: Emergency Medicine

## 2017-01-02 ENCOUNTER — Inpatient Hospital Stay (HOSPITAL_COMMUNITY)
Admission: EM | Admit: 2017-01-02 | Discharge: 2017-01-06 | DRG: 190 | Disposition: A | Discharge status: Hospice | Attending: Internal Medicine | Admitting: Internal Medicine

## 2017-01-02 ENCOUNTER — Other Ambulatory Visit: Payer: Self-pay

## 2017-01-02 DIAGNOSIS — J690 Pneumonitis due to inhalation of food and vomit: Secondary | ICD-10-CM | POA: Diagnosis present

## 2017-01-02 DIAGNOSIS — I5040 Unspecified combined systolic (congestive) and diastolic (congestive) heart failure: Secondary | ICD-10-CM | POA: Diagnosis present

## 2017-01-02 DIAGNOSIS — Z8673 Personal history of transient ischemic attack (TIA), and cerebral infarction without residual deficits: Secondary | ICD-10-CM

## 2017-01-02 DIAGNOSIS — Z8042 Family history of malignant neoplasm of prostate: Secondary | ICD-10-CM

## 2017-01-02 DIAGNOSIS — R4182 Altered mental status, unspecified: Secondary | ICD-10-CM | POA: Diagnosis not present

## 2017-01-02 DIAGNOSIS — F319 Bipolar disorder, unspecified: Secondary | ICD-10-CM | POA: Diagnosis present

## 2017-01-02 DIAGNOSIS — Z888 Allergy status to other drugs, medicaments and biological substances status: Secondary | ICD-10-CM

## 2017-01-02 DIAGNOSIS — Z808 Family history of malignant neoplasm of other organs or systems: Secondary | ICD-10-CM

## 2017-01-02 DIAGNOSIS — J441 Chronic obstructive pulmonary disease with (acute) exacerbation: Principal | ICD-10-CM | POA: Diagnosis present

## 2017-01-02 DIAGNOSIS — I110 Hypertensive heart disease with heart failure: Secondary | ICD-10-CM | POA: Diagnosis not present

## 2017-01-02 DIAGNOSIS — Z85118 Personal history of other malignant neoplasm of bronchus and lung: Secondary | ICD-10-CM

## 2017-01-02 DIAGNOSIS — I10 Essential (primary) hypertension: Secondary | ICD-10-CM | POA: Diagnosis present

## 2017-01-02 DIAGNOSIS — R1310 Dysphagia, unspecified: Secondary | ICD-10-CM | POA: Diagnosis not present

## 2017-01-02 DIAGNOSIS — Z86010 Personal history of colonic polyps: Secondary | ICD-10-CM

## 2017-01-02 DIAGNOSIS — Z9981 Dependence on supplemental oxygen: Secondary | ICD-10-CM | POA: Diagnosis not present

## 2017-01-02 DIAGNOSIS — Z87820 Personal history of traumatic brain injury: Secondary | ICD-10-CM

## 2017-01-02 DIAGNOSIS — I672 Cerebral atherosclerosis: Secondary | ICD-10-CM

## 2017-01-02 DIAGNOSIS — I5042 Chronic combined systolic (congestive) and diastolic (congestive) heart failure: Secondary | ICD-10-CM | POA: Diagnosis not present

## 2017-01-02 DIAGNOSIS — Z7901 Long term (current) use of anticoagulants: Secondary | ICD-10-CM

## 2017-01-02 DIAGNOSIS — J449 Chronic obstructive pulmonary disease, unspecified: Secondary | ICD-10-CM | POA: Diagnosis not present

## 2017-01-02 DIAGNOSIS — E785 Hyperlipidemia, unspecified: Secondary | ICD-10-CM | POA: Diagnosis present

## 2017-01-02 DIAGNOSIS — Z7951 Long term (current) use of inhaled steroids: Secondary | ICD-10-CM

## 2017-01-02 DIAGNOSIS — Z8546 Personal history of malignant neoplasm of prostate: Secondary | ICD-10-CM

## 2017-01-02 DIAGNOSIS — M549 Dorsalgia, unspecified: Secondary | ICD-10-CM | POA: Diagnosis present

## 2017-01-02 DIAGNOSIS — J479 Bronchiectasis, uncomplicated: Secondary | ICD-10-CM | POA: Diagnosis not present

## 2017-01-02 DIAGNOSIS — Z9049 Acquired absence of other specified parts of digestive tract: Secondary | ICD-10-CM

## 2017-01-02 DIAGNOSIS — R531 Weakness: Secondary | ICD-10-CM | POA: Diagnosis not present

## 2017-01-02 DIAGNOSIS — Z7982 Long term (current) use of aspirin: Secondary | ICD-10-CM

## 2017-01-02 DIAGNOSIS — R404 Transient alteration of awareness: Secondary | ICD-10-CM | POA: Diagnosis not present

## 2017-01-02 DIAGNOSIS — G9349 Other encephalopathy: Secondary | ICD-10-CM | POA: Diagnosis present

## 2017-01-02 DIAGNOSIS — R0602 Shortness of breath: Secondary | ICD-10-CM | POA: Diagnosis not present

## 2017-01-02 DIAGNOSIS — J9601 Acute respiratory failure with hypoxia: Secondary | ICD-10-CM | POA: Diagnosis present

## 2017-01-02 DIAGNOSIS — Z515 Encounter for palliative care: Secondary | ICD-10-CM | POA: Diagnosis present

## 2017-01-02 DIAGNOSIS — I252 Old myocardial infarction: Secondary | ICD-10-CM

## 2017-01-02 DIAGNOSIS — Z8249 Family history of ischemic heart disease and other diseases of the circulatory system: Secondary | ICD-10-CM

## 2017-01-02 DIAGNOSIS — I480 Paroxysmal atrial fibrillation: Secondary | ICD-10-CM | POA: Diagnosis present

## 2017-01-02 DIAGNOSIS — Z79899 Other long term (current) drug therapy: Secondary | ICD-10-CM

## 2017-01-02 DIAGNOSIS — Z955 Presence of coronary angioplasty implant and graft: Secondary | ICD-10-CM

## 2017-01-02 DIAGNOSIS — Z87891 Personal history of nicotine dependence: Secondary | ICD-10-CM

## 2017-01-02 DIAGNOSIS — K219 Gastro-esophageal reflux disease without esophagitis: Secondary | ICD-10-CM | POA: Diagnosis present

## 2017-01-02 DIAGNOSIS — G8929 Other chronic pain: Secondary | ICD-10-CM | POA: Diagnosis present

## 2017-01-02 DIAGNOSIS — Z66 Do not resuscitate: Secondary | ICD-10-CM | POA: Diagnosis present

## 2017-01-02 DIAGNOSIS — J9602 Acute respiratory failure with hypercapnia: Secondary | ICD-10-CM | POA: Diagnosis present

## 2017-01-02 DIAGNOSIS — I2510 Atherosclerotic heart disease of native coronary artery without angina pectoris: Secondary | ICD-10-CM | POA: Diagnosis present

## 2017-01-02 DIAGNOSIS — F419 Anxiety disorder, unspecified: Secondary | ICD-10-CM | POA: Diagnosis present

## 2017-01-02 LAB — CBC
HEMATOCRIT: 33.5 % — AB (ref 39.0–52.0)
HEMOGLOBIN: 10.6 g/dL — AB (ref 13.0–17.0)
MCH: 30.4 pg (ref 26.0–34.0)
MCHC: 31.6 g/dL (ref 30.0–36.0)
MCV: 96 fL (ref 78.0–100.0)
Platelets: 285 10*3/uL (ref 150–400)
RBC: 3.49 MIL/uL — AB (ref 4.22–5.81)
RDW: 15.8 % — AB (ref 11.5–15.5)
WBC: 14.4 10*3/uL — AB (ref 4.0–10.5)

## 2017-01-02 LAB — I-STAT TROPONIN, ED: Troponin i, poc: 0 ng/mL (ref 0.00–0.08)

## 2017-01-02 LAB — BASIC METABOLIC PANEL
ANION GAP: 8 (ref 5–15)
BUN: 15 mg/dL (ref 6–20)
CO2: 21 mmol/L — ABNORMAL LOW (ref 22–32)
Calcium: 6.8 mg/dL — ABNORMAL LOW (ref 8.9–10.3)
Chloride: 111 mmol/L (ref 101–111)
Creatinine, Ser: 0.6 mg/dL — ABNORMAL LOW (ref 0.61–1.24)
Glucose, Bld: 70 mg/dL (ref 65–99)
POTASSIUM: 3.8 mmol/L (ref 3.5–5.1)
SODIUM: 140 mmol/L (ref 135–145)

## 2017-01-02 LAB — BLOOD GAS, ARTERIAL
ACID-BASE EXCESS: 4.9 mmol/L — AB (ref 0.0–2.0)
BICARBONATE: 30.4 mmol/L — AB (ref 20.0–28.0)
DRAWN BY: 11249
FIO2: 50
O2 Saturation: 93.7 %
PH ART: 7.385 (ref 7.350–7.450)
Patient temperature: 99.7
pCO2 arterial: 52.3 mmHg — ABNORMAL HIGH (ref 32.0–48.0)
pO2, Arterial: 75.9 mmHg — ABNORMAL LOW (ref 83.0–108.0)

## 2017-01-02 LAB — BRAIN NATRIURETIC PEPTIDE: B NATRIURETIC PEPTIDE 5: 421.2 pg/mL — AB (ref 0.0–100.0)

## 2017-01-02 MED ORDER — METHYLPREDNISOLONE SODIUM SUCC 125 MG IJ SOLR
125.0000 mg | Freq: Once | INTRAMUSCULAR | Status: AC
  Administered 2017-01-02: 125 mg via INTRAVENOUS
  Filled 2017-01-02: qty 2

## 2017-01-02 MED ORDER — IPRATROPIUM-ALBUTEROL 0.5-2.5 (3) MG/3ML IN SOLN
3.0000 mL | Freq: Once | RESPIRATORY_TRACT | Status: AC
  Administered 2017-01-02: 3 mL via RESPIRATORY_TRACT
  Filled 2017-01-02: qty 3

## 2017-01-02 MED ORDER — DEXTROSE 5 % IV SOLN
500.0000 mg | Freq: Once | INTRAVENOUS | Status: AC
  Administered 2017-01-03: 500 mg via INTRAVENOUS
  Filled 2017-01-02: qty 500

## 2017-01-02 MED ORDER — DEXTROSE 5 % IV SOLN
1.0000 g | Freq: Once | INTRAVENOUS | Status: AC
  Administered 2017-01-03: 1 g via INTRAVENOUS
  Filled 2017-01-02: qty 10

## 2017-01-02 NOTE — ED Notes (Signed)
X ray in room.

## 2017-01-02 NOTE — ED Triage Notes (Signed)
Pt brought in by EMS from home with c/o shortness of breath.  Pt is a hospice-DNR pt for end-stage COPD----- pt's O2 sat 81% on room air by EMS; pt was placed on O2  non-rebreather mask that increased his O2 sat to 95%.  Pt is also reported to have decreased responsiveness.

## 2017-01-02 NOTE — ED Notes (Signed)
Bed: AD52 Expected date:  Expected time:  Means of arrival:  Comments: EMS hospice patient

## 2017-01-02 NOTE — ED Provider Notes (Signed)
[facility] DEPT Provider Note   CSN: [id number] Arrival date & time: 01/02/17  2127    Level 5 caveat: Altered mental status History   Chief Complaint Chief Complaint  Patient presents with  . Shortness of Breath    HPI Malik Zuniga is a 71 y.o. male.  HPI Patient presents to the emergency room for evaluation of shortness of breath.  Patient has a history of end-stage COPD associated with bronchiectasis and recurrent aspiration pneumonia.. The patient was in the hospital early November.  He was treated and released for an acute bronchitis exacerbation.   Patient has been in palliative care since the beginning of this year.  There are notes in the medical record indicating that the patient was transferred to hospice care because of worsening weakness difficulty with his COPD.  According to the EMS report.  Patient's oxygen saturation was 81% on room air.  He was placed on a nonrebreather and his oxygen saturation increased to 95%.  Patient has decreased responsiveness. Past Medical History:  Diagnosis Date  . Anemia associated with acute blood loss   . Angioedema 10/31/2010  . Anxiety    takes Valium daily  . Aspiration pneumonia ([place]) 2010  . Asthma   . Bacteremia due to Pseudomonas 10/04/2011   D/t gram neg rods   . Bipolar affective disorder ([place])   . 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 Chronic back pain    compression fracture  . Constipation    takes Colace daily as well as Miralax  . Coronary artery disease   . Depression    takes Cymbalta daily  . Emphysema of lung (HCC)    Albuterol as needed;Symbicort daily and Singulair at bedtime  . GERD (gastroesophageal reflux disease)    takes Omeprazole daily  . Headache, chronic daily   . History of bronchitis   . History of colon polyps   . History of migraine    last migraine a couple of days ago;takes Excedrin Migraine  . History of prostate  cancer 2004  . Hyperlipidemia    takes Simvastatin daily  . Hypertension    takes Metoprolol daily as well as Hyzaar  . Insomnia    takes Benadryl nightly  . Lung cancer ([place]) 2016  . Mood change    after Brain surgery mood changed and was placed on Depakote  . Myocardial infarction ([place]) 04/1998  . Neuromuscular disorder ([place]) 1998   right carpal tunnel release  . On home O2   . Shortness of breath    with exertion  . Stroke [facility]) 1998   Brain Aneurysm  . Tremor       Past Surgical History:  Procedure Laterality Date  . BACK SURGERY  08/2004; 02/2005; 04/2006; 06/2007; 7/20101   all by Dr. Annette Stable  . BACK SURGERY  05/2013  . BRAIN SURGERY  1999   clip aneurysm  . [place]   right  . CHOLECYSTECTOMY  1996  . COLONOSCOPY    . CORONARY ANGIOPLASTY WITH STENT PLACEMENT  2000   RCA stent, Dr. Rollene Fare; denies stent for PAD 06/15/13  . CORONARY ANGIOPLASTY WITH STENT PLACEMENT  04/1998  . CRANIOTOMY  1999   to clip aneurism, Dr. Annette Stable  . DG BIOPSY LUNG    . ESOPHAGOGASTRODUODENOSCOPY N/A 07/23/2012   Procedure: ESOPHAGOGASTRODUODENOSCOPY (EGD);  Surgeon: Jeryl Columbia, MD;  Location: [facility] ENDOSCOPY;  Service:  Endoscopy;  Laterality: N/A;  buccini /ja  . GASTROSTOMY W/ FEEDING TUBE  2008   Dr. Watt Climes; only had for a few months  . HAND SURGERY  1989   crushed thumb, Dr. Fredna Dow  . PROSTATECTOMY  04/2001   removal of prostate cancer, Dr. Janice Norrie  . SPINE SURGERY    . [place]   left arm, Dr. Fredna Dow       Home Medications    Prior to Admission medications   Medication Sig Start Date End Date Taking? Authorizing Provider  ARIPiprazole (ABILIFY) 10 MG tablet Take 5 mg by mouth daily after breakfast.  05/09/14  Yes [provider]  aspirin-acetaminophen-caffeine (EXCEDRIN MIGRAINE) 250-250-65 MG tablet Take 2 tablets by mouth 3 (three) times daily.    Yes [provider]  atorvastatin (LIPITOR) 20 MG tablet TAKE 1 TABLET (20 MG TOTAL) BY  MOUTH DAILY AT 6 PM. 10/03/16  Yes Belva Crome, MD  azithromycin (ZITHROMAX) 250 MG tablet Take 1 tablet (250 mg total) by mouth daily. 11/12/16  Yes Ollis, Brandi L, NP  budesonide (PULMICORT) 0.25 MG/2ML nebulizer solution Take 4 mLs (0.5 mg total) by nebulization 2 (two) times daily. 12/07/13  Yes Clance, Armando Reichert, MD  cholecalciferol (VITAMIN D) 1000 UNITS tablet Take 1,000 Units by mouth every morning.    Yes [provider]  dabigatran (PRADAXA) 150 MG CAPS capsule Take 1 capsule (150 mg total) by mouth every 12 (twelve) hours. 11/10/16  Yes Belva Crome, MD  diazepam (VALIUM) 5 MG tablet Take 1 tablet (5 mg total) by mouth every 12 (twelve) hours as needed for anxiety or muscle spasms. scheduled 09/24/16  Yes Regalado, Belkys A, MD  diltiazem (CARDIZEM CD) 240 MG 24 hr capsule Take 1 capsule (240 mg total) by mouth daily. 08/05/16 01/03/17 Yes Daune Perch, NP  divalproex (DEPAKOTE) 500 MG 24 hr tablet Take 1,000 mg by mouth at bedtime.    Yes [provider]  docusate sodium (COLACE) 100 MG capsule Take 200 mg by mouth at bedtime.    Yes [provider]  DULoxetine (CYMBALTA) 60 MG capsule Take 120 mg by mouth daily after breakfast.    Yes [provider]  feeding supplement, ENSURE ENLIVE, (ENSURE ENLIVE) LIQD Take 237 mLs by mouth daily.   Yes [provider]  HYDROcodone-acetaminophen (NORCO) 10-325 MG tablet Take 1 tablet by mouth 4 (four) times daily. 09/24/16  Yes Regalado, Belkys A, MD  ipratropium-albuterol (DUONEB) 0.5-2.5 (3) MG/3ML SOLN Take 3 mLs by nebulization every 6 (six) hours. 12/07/13  Yes Clance, Armando Reichert, MD  lactulose (CHRONULAC) 10 GM/15ML solution Take 30 mLs (20 g total) by mouth daily. Patient taking differently: Take 7.5 g by mouth daily.  10/23/16  Yes [name], MD  Maltodextrin-Xanthan Gum (RESOURCE THICKENUP CLEAR) POWD Take 1 g by mouth as needed. Patient taking differently: Take 1 g by mouth as needed  (thicken liquids).  01/24/16  Yes Eugenie Filler, MD  metoprolol tartrate (LOPRESSOR) 50 MG tablet Take 1 tablet (50 mg total) by mouth 2 (two) times daily. 08/05/16 01/03/17 Yes Daune Perch, NP  montelukast (SINGULAIR) 10 MG tablet TAKE 1 TABLET AT BEDTIME 07/29/16  Yes [name], MD  nitroGLYCERIN (NITROSTAT) 0.4 MG SL tablet PLACE 1 TABLET (0.4 MG TOTAL) UNDER THE TONGUE EVERY 5 (FIVE) MINUTES AS NEEDED FOR CHEST PAIN. 08/05/16  Yes Daune Perch, NP  omeprazole (PRILOSEC) 40 MG capsule Take 40 mg by mouth 2 (two)  times daily.    Yes [provider]  ondansetron (ZOFRAN) 4 MG tablet Take 1 tablet (4 mg total) by mouth every 8 (eight) hours as needed for nausea or vomiting. 11/19/16  Yes [name], MD  OXYGEN Inhale 2.5 L into the lungs. At bedtime and prn throughout day.   Yes [provider]  polyethylene glycol (MIRALAX / GLYCOLAX) packet Take 17 g by mouth every evening.    Yes [provider]  predniSONE (DELTASONE) 10 MG tablet 4 tabs for 3 days, then 3 tabs for 3 days, 2 tabs for 3 days, then 1 tab daily Patient taking differently: Take 10 mg by mouth daily with breakfast.  12/19/16  Yes Parrett, Tammy S, NP  primidone (MYSOLINE) 250 MG tablet TAKE 1 TABLET TWICE DAILY 07/29/16  Yes [name], MD  ranitidine (ZANTAC) 150 MG tablet Take 300 mg by mouth 2 (two) times daily.    Yes [provider]  sodium chloride HYPERTONIC 3 % nebulizer solution Take by nebulization 2 (two) times daily. 11/05/16  Yes [name], MD  levofloxacin (LEVAQUIN) 750 MG tablet Take 1 tablet (750 mg total) daily by mouth. Patient not taking: Reported on 22/03/5425 07/13/35   Delora Fuel, MD  losartan (COZAAR) 50 MG tablet Take 50 mg by mouth at bedtime.     [provider]  predniSONE (DELTASONE) 50 MG tablet Take 1 tablet (50 mg total) daily by mouth. Patient not taking: Reported on 62/83/1517 61/6/07   Delora Fuel, MD    traZODone (DESYREL) 50 MG tablet Take 50 mg by mouth at bedtime. 12/30/16   [provider]    Family History Family History  Problem Relation Age of Onset  . Heart disease Mother   . Cancer Father        brain cancer and prostate cancer     Social History Social History   Tobacco Use  . Smoking status: Former Smoker    Packs/day: 1.50    Years: 52.00    Pack years: 78.00    Types: Cigarettes    Last attempt to quit: 07/17/2012    Years since quitting: 4.4  . Smokeless tobacco: Never Used  . Tobacco comment: Former smoker  Substance Use Topics  . Alcohol use: No    Alcohol/week: 0.0 oz  . Drug use: No     Allergies   Lisinopril; Lamictal [lamotrigine]; and Ambien [zolpidem]   Review of Systems Review of Systems  All other systems reviewed and are negative.    Physical Exam Updated Vital Signs BP 129/84 (BP Location: Right Arm)   Pulse (!) 107   Temp 99.7 F (37.6 C) (Rectal)   Resp 20   SpO2 93%   Physical Exam  Constitutional: He appears lethargic. He appears ill. No distress.  HENT:  Head: Normocephalic and atraumatic.  Right Ear: External ear normal.  Left Ear: External ear normal.  Eyes: Conjunctivae are normal. Right eye exhibits no discharge. Left eye exhibits no discharge. No scleral icterus.  Neck: Neck supple. No tracheal deviation present.  Cardiovascular: Normal rate, regular rhythm and intact distal pulses.  Pulmonary/Chest: Breath sounds normal. Accessory muscle usage present. No stridor. Tachypnea noted. No respiratory distress. He has no wheezes. He has no rales.  Abdominal: Soft. Bowel sounds are normal. He exhibits no distension. There is no tenderness. There is no rebound and no guarding.  Musculoskeletal: He exhibits no edema or tenderness.  Neurological: He appears lethargic. No cranial nerve deficit (  Difficult to assess, patient does not comply with exam, his speech is garbled ) or sensory deficit. He exhibits normal muscle  tone. He displays no seizure activity. GCS eye subscore is 3. GCS verbal subscore is 3. GCS motor subscore is 5.  Unable to get patient to move his arms or legs, he is lying in the bed with his head turned to the left, he does open his eyes when I speak to him and answers incomprehensibly  Skin: Skin is warm and dry. No rash noted.  Psychiatric: He has a normal mood and affect.  Nursing note and vitals reviewed.    ED Treatments / Results  Labs (all labs ordered are listed, but only abnormal results are displayed) Labs Reviewed  BASIC METABOLIC PANEL - Abnormal; Notable for the following components:      Result Value   CO2 21 (*)    Creatinine, Ser 0.60 (*)    Calcium 6.8 (*)    All other components within normal limits  CBC - Abnormal; Notable for the following components:   WBC 14.4 (*)    RBC 3.49 (*)    Hemoglobin 10.6 (*)    HCT 33.5 (*)    RDW 15.8 (*)    All other components within normal limits  BRAIN NATRIURETIC PEPTIDE - Abnormal; Notable for the following components:   B Natriuretic Peptide 421.2 (*)    All other components within normal limits  BLOOD GAS, ARTERIAL - Abnormal; Notable for the following components:   pCO2 arterial 52.3 (*)    pO2, Arterial 75.9 (*)    Bicarbonate 30.4 (*)    Acid-Base Excess 4.9 (*)    All other components within normal limits  URINALYSIS, ROUTINE W REFLEX MICROSCOPIC  AMMONIA  I-STAT TROPONIN, ED    EKG  EKG Interpretation  Date/Time:  Friday January 02 2017 22:20:08 EST Ventricular Rate:  107 PR Interval:    QRS Duration: 86 QT Interval:  322 QTC Calculation: 430 R Axis:   63 Text Interpretation:  Sinus tachycardia Borderline repolarization abnormality Since last tracing rate faster Confirmed by Dorie Rank [phone]) on 01/02/2017 10:49:38 PM       Radiology Dg Chest Port 1 View  Result Date: 01/02/2017 CLINICAL DATA:  Shortness of breath. End-stage COPD. Decreased oxygen saturation. Decreased responsiveness.  History of lung cancer, former smoker. EXAM: PORTABLE CHEST 1 VIEW COMPARISON:  12/16/2016 and 09/22/2016 FINDINGS: Shallow inspiration. Normal heart size and pulmonary vascularity. No focal airspace disease or consolidation in the lungs. No blunting of costophrenic angles. No pneumothorax. Mediastinal contours appear intact. Postoperative changes in the thoracolumbar spine. Patient rotation limits examination. IMPRESSION: No active disease. Electronically Signed   By: Lucienne Capers M.D.   On: 01/02/2017 23:01    Procedures Procedures (including critical care time)  Medications Ordered in ED Medications  cefTRIAXone (ROCEPHIN) 1 g in dextrose 5 % 50 mL IVPB (1 g Intravenous New Bag/Given 01/03/17 0000)  azithromycin (ZITHROMAX) 500 mg in dextrose 5 % 250 mL IVPB (not administered)  methylPREDNISolone sodium succinate (SOLU-MEDROL) 125 mg/2 mL injection 125 mg (125 mg Intravenous Given 01/02/17 2346)  ipratropium-albuterol (DUONEB) 0.5-2.5 (3) MG/3ML nebulizer solution 3 mL (3 mLs Nebulization Given 01/02/17 2337)     Initial Impression / Assessment and Plan / ED Course  I have reviewed the triage vital signs and the nursing notes.  Pertinent labs & imaging results that were available during my care of the patient were reviewed by me and considered in my medical decision  making (see chart for details).  Clinical Course as of Jan 03 17  Ludwig Clarks Jan 02, 2017  2338 Discussed findings with the patient's wife.  Patient has leukocytosis but otherwise no evidence of pneumonia.  His ABG shows a component of chronic hypercarbia and hypoxia but no severe respiratory acidosis.  Wife mentioned he has had trouble with his ammonia level in the past and she asked if I could check that.  She agrees the patient is in hospice care and is not interested in any aggressive measures.  [JK]    Clinical Course User Index [JK] Dorie Rank, MD    Patient presented to the emergency room for evaluation of worsening  shortness of breath.  Patient has severe end-stage COPD.  He is currently on hospice/palliative care.  I discussed the findings with the patient's wife.  She confirms that he is DNR/DNI.  I started the patient on antibiotics.  We are continuing supplemental oxygen and breathing treatments.  I considered BiPAP however he currently is not hypoxic and his gas does not show any significant respiratory acidosis.  I will consult with medical service for admission.  Final Clinical Impressions(s) / ED Diagnoses   Final diagnoses:  COPD exacerbation ([place])  Altered mental status, unspecified altered mental status type    ED Discharge Orders    None       Dorie Rank, MD 01/03/17 [phone]

## 2017-01-03 ENCOUNTER — Other Ambulatory Visit: Payer: Self-pay

## 2017-01-03 DIAGNOSIS — Z9049 Acquired absence of other specified parts of digestive tract: Secondary | ICD-10-CM | POA: Diagnosis not present

## 2017-01-03 DIAGNOSIS — J9601 Acute respiratory failure with hypoxia: Secondary | ICD-10-CM | POA: Diagnosis present

## 2017-01-03 DIAGNOSIS — J9602 Acute respiratory failure with hypercapnia: Secondary | ICD-10-CM | POA: Diagnosis present

## 2017-01-03 DIAGNOSIS — J690 Pneumonitis due to inhalation of food and vomit: Secondary | ICD-10-CM | POA: Diagnosis not present

## 2017-01-03 DIAGNOSIS — I10 Essential (primary) hypertension: Secondary | ICD-10-CM

## 2017-01-03 DIAGNOSIS — Z7901 Long term (current) use of anticoagulants: Secondary | ICD-10-CM

## 2017-01-03 DIAGNOSIS — E785 Hyperlipidemia, unspecified: Secondary | ICD-10-CM | POA: Diagnosis present

## 2017-01-03 DIAGNOSIS — Z8673 Personal history of transient ischemic attack (TIA), and cerebral infarction without residual deficits: Secondary | ICD-10-CM | POA: Diagnosis not present

## 2017-01-03 DIAGNOSIS — R4182 Altered mental status, unspecified: Secondary | ICD-10-CM | POA: Diagnosis not present

## 2017-01-03 DIAGNOSIS — Z9981 Dependence on supplemental oxygen: Secondary | ICD-10-CM | POA: Diagnosis not present

## 2017-01-03 DIAGNOSIS — I110 Hypertensive heart disease with heart failure: Secondary | ICD-10-CM | POA: Diagnosis not present

## 2017-01-03 DIAGNOSIS — Z515 Encounter for palliative care: Secondary | ICD-10-CM | POA: Diagnosis present

## 2017-01-03 DIAGNOSIS — M549 Dorsalgia, unspecified: Secondary | ICD-10-CM | POA: Diagnosis present

## 2017-01-03 DIAGNOSIS — R1310 Dysphagia, unspecified: Secondary | ICD-10-CM | POA: Diagnosis not present

## 2017-01-03 DIAGNOSIS — I252 Old myocardial infarction: Secondary | ICD-10-CM | POA: Diagnosis not present

## 2017-01-03 DIAGNOSIS — I480 Paroxysmal atrial fibrillation: Secondary | ICD-10-CM

## 2017-01-03 DIAGNOSIS — Z86010 Personal history of colonic polyps: Secondary | ICD-10-CM | POA: Diagnosis not present

## 2017-01-03 DIAGNOSIS — I2510 Atherosclerotic heart disease of native coronary artery without angina pectoris: Secondary | ICD-10-CM | POA: Diagnosis present

## 2017-01-03 DIAGNOSIS — F419 Anxiety disorder, unspecified: Secondary | ICD-10-CM | POA: Diagnosis present

## 2017-01-03 DIAGNOSIS — G9349 Other encephalopathy: Secondary | ICD-10-CM | POA: Diagnosis present

## 2017-01-03 DIAGNOSIS — J441 Chronic obstructive pulmonary disease with (acute) exacerbation: Secondary | ICD-10-CM | POA: Diagnosis not present

## 2017-01-03 DIAGNOSIS — F319 Bipolar disorder, unspecified: Secondary | ICD-10-CM | POA: Diagnosis present

## 2017-01-03 DIAGNOSIS — I5042 Chronic combined systolic (congestive) and diastolic (congestive) heart failure: Secondary | ICD-10-CM

## 2017-01-03 DIAGNOSIS — Z8546 Personal history of malignant neoplasm of prostate: Secondary | ICD-10-CM | POA: Diagnosis not present

## 2017-01-03 DIAGNOSIS — J479 Bronchiectasis, uncomplicated: Secondary | ICD-10-CM | POA: Diagnosis not present

## 2017-01-03 DIAGNOSIS — Z66 Do not resuscitate: Secondary | ICD-10-CM | POA: Diagnosis present

## 2017-01-03 DIAGNOSIS — K219 Gastro-esophageal reflux disease without esophagitis: Secondary | ICD-10-CM | POA: Diagnosis present

## 2017-01-03 DIAGNOSIS — J449 Chronic obstructive pulmonary disease, unspecified: Secondary | ICD-10-CM | POA: Diagnosis not present

## 2017-01-03 DIAGNOSIS — G8929 Other chronic pain: Secondary | ICD-10-CM | POA: Diagnosis present

## 2017-01-03 DIAGNOSIS — I672 Cerebral atherosclerosis: Secondary | ICD-10-CM | POA: Diagnosis not present

## 2017-01-03 LAB — COMPREHENSIVE METABOLIC PANEL
ALT: 15 U/L — ABNORMAL LOW (ref 17–63)
ANION GAP: 9 (ref 5–15)
AST: 18 U/L (ref 15–41)
Albumin: 3 g/dL — ABNORMAL LOW (ref 3.5–5.0)
Alkaline Phosphatase: 51 U/L (ref 38–126)
BILIRUBIN TOTAL: 0.6 mg/dL (ref 0.3–1.2)
BUN: 16 mg/dL (ref 6–20)
CHLORIDE: 99 mmol/L — AB (ref 101–111)
CO2: 30 mmol/L (ref 22–32)
Calcium: 9.2 mg/dL (ref 8.9–10.3)
Creatinine, Ser: 0.65 mg/dL (ref 0.61–1.24)
GFR calc Af Amer: >60 mL/min (ref >60)
Glucose, Bld: 124 mg/dL — ABNORMAL HIGH (ref 65–99)
POTASSIUM: 4.1 mmol/L (ref 3.5–5.1)
Sodium: 138 mmol/L (ref 135–145)
TOTAL PROTEIN: 7.1 g/dL (ref 6.5–8.1)

## 2017-01-03 LAB — URINALYSIS, ROUTINE W REFLEX MICROSCOPIC
Bilirubin Urine: NEGATIVE
Glucose, UA: NEGATIVE mg/dL
Hgb urine dipstick: NEGATIVE
Ketones, ur: 20 mg/dL — AB
LEUKOCYTES UA: NEGATIVE
Nitrite: NEGATIVE
PROTEIN: NEGATIVE mg/dL
SPECIFIC GRAVITY, URINE: 1.025 (ref 1.005–1.030)
pH: 6 (ref 5.0–8.0)

## 2017-01-03 LAB — INFLUENZA PANEL BY PCR (TYPE A & B)
INFLAPCR: NEGATIVE
INFLBPCR: NEGATIVE

## 2017-01-03 LAB — CBC
HEMATOCRIT: 38.3 % — AB (ref 39.0–52.0)
Hemoglobin: 12.7 g/dL — ABNORMAL LOW (ref 13.0–17.0)
MCH: 31 pg (ref 26.0–34.0)
MCHC: 33.2 g/dL (ref 30.0–36.0)
MCV: 93.4 fL (ref 78.0–100.0)
PLATELETS: 387 10*3/uL (ref 150–400)
RBC: 4.1 MIL/uL — ABNORMAL LOW (ref 4.22–5.81)
RDW: 15.5 % (ref 11.5–15.5)
WBC: 19.7 10*3/uL — AB (ref 4.0–10.5)

## 2017-01-03 LAB — AMMONIA: AMMONIA: 58 umol/L — AB (ref 9–35)

## 2017-01-03 MED ORDER — DILTIAZEM HCL ER COATED BEADS 240 MG PO CP24
240.0000 mg | ORAL_CAPSULE | Freq: Every day | ORAL | Status: DC
  Administered 2017-01-04 – 2017-01-06 (×3): 240 mg via ORAL
  Filled 2017-01-03 (×3): qty 1

## 2017-01-03 MED ORDER — DULOXETINE HCL 60 MG PO CPEP
120.0000 mg | ORAL_CAPSULE | Freq: Every day | ORAL | Status: DC
  Administered 2017-01-04 – 2017-01-06 (×3): 120 mg via ORAL
  Filled 2017-01-03 (×3): qty 2

## 2017-01-03 MED ORDER — DIAZEPAM 5 MG PO TABS: 5.0000 mg | ORAL_TABLET | Freq: Two times a day (BID) | ORAL | Status: DC | PRN

## 2017-01-03 MED ORDER — ARIPIPRAZOLE 5 MG PO TABS
5.0000 mg | ORAL_TABLET | Freq: Every day | ORAL | Status: DC
  Administered 2017-01-04 – 2017-01-06 (×3): 5 mg via ORAL
  Filled 2017-01-03 (×3): qty 1

## 2017-01-03 MED ORDER — METOPROLOL TARTRATE 5 MG/5ML IV SOLN
10.0000 mg | INTRAVENOUS | Status: DC | PRN
  Administered 2017-01-04 (×3): 10 mg via INTRAVENOUS
  Filled 2017-01-03 (×3): qty 10

## 2017-01-03 MED ORDER — SODIUM CHLORIDE 0.9 % IV SOLN
INTRAVENOUS | Status: DC
  Administered 2017-01-03: 06:00:00 via INTRAVENOUS

## 2017-01-03 MED ORDER — AZITHROMYCIN 250 MG PO TABS
500.0000 mg | ORAL_TABLET | Freq: Every day | ORAL | Status: DC
  Administered 2017-01-04 – 2017-01-06 (×3): 500 mg via ORAL
  Filled 2017-01-03 (×3): qty 2

## 2017-01-03 MED ORDER — DIVALPROEX SODIUM ER 500 MG PO TB24
1000.0000 mg | ORAL_TABLET | Freq: Every day | ORAL | Status: DC
  Administered 2017-01-04 – 2017-01-05 (×2): 1000 mg via ORAL
  Filled 2017-01-03 (×2): qty 2

## 2017-01-03 MED ORDER — ONDANSETRON HCL 4 MG/2ML IJ SOLN: 4.0000 mg | Freq: Four times a day (QID) | INTRAMUSCULAR | Status: DC | PRN

## 2017-01-03 MED ORDER — ENOXAPARIN SODIUM 80 MG/0.8ML ~~LOC~~ SOLN
75.0000 mg | Freq: Once | SUBCUTANEOUS | Status: AC
  Administered 2017-01-03: 75 mg via SUBCUTANEOUS
  Filled 2017-01-03: qty 0.8

## 2017-01-03 MED ORDER — IPRATROPIUM BROMIDE 0.02 % IN SOLN
0.5000 mg | Freq: Four times a day (QID) | RESPIRATORY_TRACT | Status: DC
  Administered 2017-01-03 – 2017-01-05 (×9): 0.5 mg via RESPIRATORY_TRACT
  Filled 2017-01-03 (×9): qty 2.5

## 2017-01-03 MED ORDER — PIPERACILLIN-TAZOBACTAM 3.375 G IVPB
3.3750 g | Freq: Three times a day (TID) | INTRAVENOUS | Status: DC
  Administered 2017-01-03 – 2017-01-06 (×9): 3.375 g via INTRAVENOUS
  Filled 2017-01-03 (×9): qty 50

## 2017-01-03 MED ORDER — LORAZEPAM 2 MG/ML IJ SOLN
0.2500 mg | Freq: Once | INTRAMUSCULAR | Status: AC
  Administered 2017-01-03: 0.25 mg via INTRAVENOUS
  Filled 2017-01-03: qty 1

## 2017-01-03 MED ORDER — ONDANSETRON HCL 4 MG PO TABS: 4.0000 mg | ORAL_TABLET | Freq: Four times a day (QID) | ORAL | Status: DC | PRN

## 2017-01-03 MED ORDER — DABIGATRAN ETEXILATE MESYLATE 150 MG PO CAPS
150.0000 mg | ORAL_CAPSULE | Freq: Two times a day (BID) | ORAL | Status: DC
  Filled 2017-01-03: qty 1

## 2017-01-03 MED ORDER — TRAZODONE HCL 50 MG PO TABS
50.0000 mg | ORAL_TABLET | Freq: Every day | ORAL | Status: DC
  Administered 2017-01-04 – 2017-01-05 (×2): 50 mg via ORAL
  Filled 2017-01-03 (×2): qty 1

## 2017-01-03 MED ORDER — DEXTROSE 5 % IV SOLN
1.0000 g | INTRAVENOUS | Status: DC
  Administered 2017-01-03: 1 g via INTRAVENOUS
  Filled 2017-01-03: qty 10

## 2017-01-03 MED ORDER — ACETAMINOPHEN 650 MG RE SUPP: 650.0000 mg | Freq: Four times a day (QID) | RECTAL | Status: DC | PRN

## 2017-01-03 MED ORDER — LEVALBUTEROL HCL 0.63 MG/3ML IN NEBU
0.6300 mg | INHALATION_SOLUTION | Freq: Four times a day (QID) | RESPIRATORY_TRACT | Status: DC
  Administered 2017-01-03 – 2017-01-05 (×9): 0.63 mg via RESPIRATORY_TRACT
  Filled 2017-01-03 (×9): qty 3

## 2017-01-03 MED ORDER — CHLORHEXIDINE GLUCONATE 0.12 % MT SOLN
15.0000 mL | Freq: Two times a day (BID) | OROMUCOSAL | Status: DC
  Administered 2017-01-03 – 2017-01-06 (×6): 15 mL via OROMUCOSAL
  Filled 2017-01-03 (×6): qty 15

## 2017-01-03 MED ORDER — LORAZEPAM 2 MG/ML IJ SOLN
0.2500 mg | INTRAMUSCULAR | Status: DC | PRN
  Administered 2017-01-04: 0.25 mg via INTRAVENOUS
  Filled 2017-01-03: qty 1

## 2017-01-03 MED ORDER — PANTOPRAZOLE SODIUM 40 MG PO TBEC
40.0000 mg | DELAYED_RELEASE_TABLET | Freq: Every day | ORAL | Status: DC
  Administered 2017-01-04 – 2017-01-06 (×3): 40 mg via ORAL
  Filled 2017-01-03 (×3): qty 1

## 2017-01-03 MED ORDER — METHYLPREDNISOLONE SODIUM SUCC 125 MG IJ SOLR
60.0000 mg | Freq: Four times a day (QID) | INTRAMUSCULAR | Status: DC
  Administered 2017-01-03 – 2017-01-04 (×5): 60 mg via INTRAVENOUS
  Filled 2017-01-03 (×5): qty 2

## 2017-01-03 MED ORDER — METOPROLOL TARTRATE 5 MG/5ML IV SOLN: 5.0000 mg | INTRAVENOUS | Status: DC | PRN

## 2017-01-03 MED ORDER — MONTELUKAST SODIUM 10 MG PO TABS
10.0000 mg | ORAL_TABLET | Freq: Every day | ORAL | Status: DC
  Administered 2017-01-04 – 2017-01-05 (×2): 10 mg via ORAL
  Filled 2017-01-03 (×2): qty 1

## 2017-01-03 MED ORDER — PRIMIDONE 250 MG PO TABS
250.0000 mg | ORAL_TABLET | Freq: Two times a day (BID) | ORAL | Status: DC
  Administered 2017-01-04 – 2017-01-06 (×5): 250 mg via ORAL
  Filled 2017-01-03 (×7): qty 1

## 2017-01-03 MED ORDER — METOPROLOL TARTRATE 50 MG PO TABS: 50.0000 mg | ORAL_TABLET | Freq: Two times a day (BID) | ORAL | Status: DC

## 2017-01-03 MED ORDER — DOCUSATE SODIUM 100 MG PO CAPS
200.0000 mg | ORAL_CAPSULE | Freq: Every day | ORAL | Status: DC
  Administered 2017-01-04 – 2017-01-05 (×2): 200 mg via ORAL
  Filled 2017-01-03 (×2): qty 2

## 2017-01-03 MED ORDER — ACETAMINOPHEN 325 MG PO TABS
650.0000 mg | ORAL_TABLET | Freq: Four times a day (QID) | ORAL | Status: DC | PRN
  Administered 2017-01-06: 650 mg via ORAL
  Filled 2017-01-03: qty 2

## 2017-01-03 MED ORDER — ENOXAPARIN SODIUM 80 MG/0.8ML ~~LOC~~ SOLN
75.0000 mg | Freq: Two times a day (BID) | SUBCUTANEOUS | Status: DC
  Administered 2017-01-04: 06:00:00 75 mg via SUBCUTANEOUS
  Filled 2017-01-03 (×2): qty 0.8

## 2017-01-03 MED ORDER — FUROSEMIDE 10 MG/ML IJ SOLN
40.0000 mg | Freq: Every day | INTRAMUSCULAR | Status: DC
  Administered 2017-01-03: 40 mg via INTRAVENOUS
  Filled 2017-01-03: qty 4

## 2017-01-03 MED ORDER — ORAL CARE MOUTH RINSE
15.0000 mL | Freq: Two times a day (BID) | OROMUCOSAL | Status: DC
  Administered 2017-01-03 – 2017-01-05 (×5): 15 mL via OROMUCOSAL

## 2017-01-03 MED ORDER — METOPROLOL TARTRATE 5 MG/5ML IV SOLN
5.0000 mg | Freq: Four times a day (QID) | INTRAVENOUS | Status: DC
  Administered 2017-01-03 – 2017-01-04 (×4): 5 mg via INTRAVENOUS
  Filled 2017-01-03 (×4): qty 5

## 2017-01-03 MED ORDER — ATORVASTATIN CALCIUM 20 MG PO TABS
20.0000 mg | ORAL_TABLET | Freq: Every day | ORAL | Status: DC
  Administered 2017-01-04 – 2017-01-05 (×2): 20 mg via ORAL
  Filled 2017-01-03 (×2): qty 1

## 2017-01-03 NOTE — Progress Notes (Addendum)
Pharmacy Antibiotic Note  Malik Zuniga is a 71 y.o. male admitted on 01/02/2017 with pneumonia.  Pharmacy has been consulted for zosyn dosing.  Ceftriaxone + azithromycin at admission changed to zosyn + azithromycin on 11/24, concerns for aspiration PNA   Plan: Zosyn 3.375g IV q8h (4 hour infusion).  - pharmacy to sign-off as do not anticipate need for dose adjustment  Height: 5\' 9"  (175.3 cm) Weight: 164 lb 14.5 oz (74.8 kg) IBW/kg (Calculated) : 70.7  Temp (24hrs), Avg:98.6 F (37 C), Min:97.9 F (36.6 C), Max:99.7 F (37.6 C)  Recent Labs  Lab 01/02/17 2224 01/03/17 0526  WBC 14.4* 19.7*  CREATININE 0.60* 0.65    Estimated Creatinine Clearance: 84.7 mL/min (by C-G formula based on SCr of 0.65 mg/dL).    Allergies  Allergen Reactions  . Lisinopril Swelling    ANGIOEDEMA  . Lamictal [Lamotrigine] Other (See Comments)    Weakness/difficulty swallowing  . Ambien [Zolpidem] Other (See Comments)    Unknown reaction   Antimicrobials this admission:  11/24 ceftriaxone >> 11/24 11/24 azithromycin >> 11/24 pip/tazo >>  Dose adjustments this admission:   Microbiology results:  None  Thank you for allowing pharmacy to be a part of this patient's care.  Doreene Eland, PharmD, BCPS.   Pager: [phone] 01/03/2017 1:25 PM

## 2017-01-03 NOTE — H&P (Signed)
TRH H&P    Patient Demographics:    Malik Zuniga, is a 71 y.o. male  MRN: [id number]  DOB - 1945/10/11  Admit Date - 01/02/2017  Referring MD/NP/PA: Dr. Tomi Bamberger  Outpatient Primary MD for the patient is [name] Aundra Millet, MD  Patient coming from: Home  Chief Complaint  Patient presents with  . Shortness of Breath      HPI:    Malik Zuniga  is a 71 y.o. male,.. With history of end-stage COPD, associated with bronchiectasis, recurrent aspiration pneumonia who is currently enrolled in hospice, was brought to hospital with worsening shortness of breath.  Per the patient's wife visited his son yesterday for Thanksgiving dinner, this morning he was very lethargic and struggling to breathe.  He slept for 12 hours, and when he woke up he was better but still was  having difficulty breathing.  So wife called hospice, and hospice recommended patient with sent to ED for further evaluation.  As per EMS patient's O2 sats was 81% on room air, he was placed on nonrebreather his O2 sats improved to 95%.. ABG showed pH 7.385, PCO2 52.3, PO2 75.9.  Patient was lethargic, chest x-ray showed no infiltrate. He was started on ceftriaxone and Zithromax empirically also received 1 dose of Solu-Medrol.  Breathing improved with DuoNeb nebulizer treatment.  Patient is unable to provide any history History obtained from wife  As per wife he also had diarrhea which resolved yesterday Denies nausea or vomiting. Denies chest pain    Review of systems:      All other systems reviewed and are negative.   With Past History of the following :    Past Medical History:  Diagnosis Date  . Anemia associated with acute blood loss   . Angioedema 10/31/2010  . Anxiety    takes Valium daily  . Aspiration pneumonia ([place]) 2010  . Asthma   . Bacteremia due to Pseudomonas 10/04/2011   D/t gram neg rods   . Bipolar affective  disorder ([place])   . 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 Chronic back pain    compression fracture  . Constipation    takes Colace daily as well as Miralax  . Coronary artery disease   . Depression    takes Cymbalta daily  . Emphysema of lung (HCC)    Albuterol as needed;Symbicort daily and Singulair at bedtime  . GERD (gastroesophageal reflux disease)    takes Omeprazole daily  . Headache, chronic daily   . History of bronchitis   . History of colon polyps   . History of migraine    last migraine a couple of days ago;takes Excedrin Migraine  . History of prostate cancer 2004  . Hyperlipidemia    takes Simvastatin daily  . Hypertension    takes Metoprolol daily as well as Hyzaar  . Insomnia    takes Benadryl nightly  . Lung cancer ([place]) 2016  . Mood change    after Brain surgery mood changed  and was placed on Depakote  . Myocardial infarction ([place]) 04/1998  . Neuromuscular disorder ([place]) 1998   right carpal tunnel release  . On home O2   . Shortness of breath    with exertion  . Stroke [facility]) 1998   Brain Aneurysm  . Tremor       Past Surgical History:  Procedure Laterality Date  . BACK SURGERY  08/2004; 02/2005; 04/2006; 06/2007; 7/20101   all by Dr. Annette Stable  . BACK SURGERY  05/2013  . BRAIN SURGERY  1999   clip aneurysm  . [place]   right  . CHOLECYSTECTOMY  1996  . COLONOSCOPY    . CORONARY ANGIOPLASTY WITH STENT PLACEMENT  2000   RCA stent, Dr. Rollene Fare; denies stent for PAD 06/15/13  . CORONARY ANGIOPLASTY WITH STENT PLACEMENT  04/1998  . CRANIOTOMY  1999   to clip aneurism, Dr. Annette Stable  . DG BIOPSY LUNG    . ESOPHAGOGASTRODUODENOSCOPY N/A 07/23/2012   Procedure: ESOPHAGOGASTRODUODENOSCOPY (EGD);  Surgeon: Jeryl Columbia, MD;  Location: [facility] ENDOSCOPY;  Service: Endoscopy;  Laterality: N/A;  buccini /ja  . GASTROSTOMY W/ FEEDING TUBE  2008   Dr. Watt Climes; only had for a few months  . HAND SURGERY  1989   crushed  thumb, Dr. Fredna Dow  . PROSTATECTOMY  04/2001   removal of prostate cancer, Dr. Janice Norrie  . SPINE SURGERY    . [place]   left arm, Dr. Fredna Dow      Social History:      Social History   Tobacco Use  . Smoking status: Former Smoker    Packs/day: 1.50    Years: 52.00    Pack years: 78.00    Types: Cigarettes    Last attempt to quit: 07/17/2012    Years since quitting: 4.4  . Smokeless tobacco: Never Used  . Tobacco comment: Former smoker  Substance Use Topics  . Alcohol use: No    Alcohol/week: 0.0 oz       Family History :     Family History  Problem Relation Age of Onset  . Heart disease Mother   . Cancer Father        brain cancer and prostate cancer       Home Medications:   Prior to Admission medications   Medication Sig Start Date End Date Taking? Authorizing Provider  ARIPiprazole (ABILIFY) 10 MG tablet Take 5 mg by mouth daily after breakfast.  05/09/14  Yes [provider]  aspirin-acetaminophen-caffeine (EXCEDRIN MIGRAINE) 250-250-65 MG tablet Take 2 tablets by mouth 3 (three) times daily.    Yes [provider]  atorvastatin (LIPITOR) 20 MG tablet TAKE 1 TABLET (20 MG TOTAL) BY MOUTH DAILY AT 6 PM. 10/03/16  Yes Belva Crome, MD  azithromycin (ZITHROMAX) 250 MG tablet Take 1 tablet (250 mg total) by mouth daily. 11/12/16  Yes Ollis, Brandi L, NP  budesonide (PULMICORT) 0.25 MG/2ML nebulizer solution Take 4 mLs (0.5 mg total) by nebulization 2 (two) times daily. 12/07/13  Yes Clance, Armando Reichert, MD  cholecalciferol (VITAMIN D) 1000 UNITS tablet Take 1,000 Units by mouth every morning.    Yes [provider]  dabigatran (PRADAXA) 150 MG CAPS capsule Take 1 capsule (150 mg total) by mouth every 12 (twelve) hours. 11/10/16  Yes Belva Crome, MD  diazepam (VALIUM) 5 MG tablet Take 1 tablet (5 mg total) by mouth every 12 (twelve) hours as needed for anxiety or muscle spasms.  scheduled 09/24/16  Yes Regalado, Belkys A, MD  diltiazem  (CARDIZEM CD) 240 MG 24 hr capsule Take 1 capsule (240 mg total) by mouth daily. Patient taking differently: Take 240 mg by mouth daily after breakfast.  08/05/16 01/03/17 Yes Daune Perch, NP  DiphenhydrAMINE HCl, Sleep, (ZZZQUIL) 25 MG CAPS Take 50 mg by mouth at bedtime.   Yes [provider]  divalproex (DEPAKOTE) 500 MG 24 hr tablet Take 1,000 mg by mouth at bedtime.    Yes [provider]  docusate sodium (COLACE) 100 MG capsule Take 200 mg by mouth at bedtime.    Yes [provider]  DULoxetine (CYMBALTA) 60 MG capsule Take 120 mg by mouth daily after breakfast.    Yes [provider]  feeding supplement, ENSURE ENLIVE, (ENSURE ENLIVE) LIQD Take 237 mLs by mouth daily.   Yes [provider]  HYDROcodone-acetaminophen (NORCO) 10-325 MG tablet Take 1 tablet by mouth 4 (four) times daily. 09/24/16  Yes Regalado, Belkys A, MD  ipratropium-albuterol (DUONEB) 0.5-2.5 (3) MG/3ML SOLN Take 3 mLs by nebulization every 6 (six) hours. 12/07/13  Yes Clance, Armando Reichert, MD  lactulose (CHRONULAC) 10 GM/15ML solution Take 30 mLs (20 g total) by mouth daily. Patient taking differently: Take 6.7 g by mouth daily.  10/23/16  Yes [name], MD  Maltodextrin-Xanthan Gum (RESOURCE THICKENUP CLEAR) POWD Take 1 g by mouth as needed. Patient taking differently: Take 1 g by mouth as needed (thicken liquids).  01/24/16  Yes Eugenie Filler, MD  metoprolol tartrate (LOPRESSOR) 50 MG tablet Take 1 tablet (50 mg total) by mouth 2 (two) times daily. 08/05/16 01/03/17 Yes Daune Perch, NP  montelukast (SINGULAIR) 10 MG tablet TAKE 1 TABLET AT BEDTIME 07/29/16  Yes [name], MD  nitroGLYCERIN (NITROSTAT) 0.4 MG SL tablet PLACE 1 TABLET (0.4 MG TOTAL) UNDER THE TONGUE EVERY 5 (FIVE) MINUTES AS NEEDED FOR CHEST PAIN. 08/05/16  Yes Daune Perch, NP  omeprazole (PRILOSEC) 40 MG capsule Take 40 mg by mouth 2 (two) times daily.    Yes [provider]    ondansetron (ZOFRAN) 4 MG tablet Take 1 tablet (4 mg total) by mouth every 8 (eight) hours as needed for nausea or vomiting. 11/19/16  Yes [name], MD  OXYGEN Inhale 2.5 L into the lungs. At bedtime and prn throughout day.   Yes [provider]  polyethylene glycol (MIRALAX / GLYCOLAX) packet Take 17 g by mouth every evening.    Yes [provider]  predniSONE (DELTASONE) 10 MG tablet 4 tabs for 3 days, then 3 tabs for 3 days, 2 tabs for 3 days, then 1 tab daily Patient taking differently: Take 10 mg by mouth daily with breakfast.  12/19/16  Yes Parrett, Tammy S, NP  primidone (MYSOLINE) 250 MG tablet TAKE 1 TABLET TWICE DAILY 07/29/16  Yes [name], MD  ranitidine (ZANTAC) 150 MG tablet Take 300 mg by mouth 2 (two) times daily.    Yes [provider]  sodium chloride HYPERTONIC 3 % nebulizer solution Take by nebulization 2 (two) times daily. 11/05/16  Yes [name], MD  traZODone (DESYREL) 50 MG tablet Take 50 mg by mouth at bedtime. 12/30/16  Yes [provider]  levofloxacin (LEVAQUIN) 750 MG tablet Take 1 tablet (750 mg total) daily by mouth. Patient not taking: Reported on 75/64/3329 51/8/84   Delora Fuel, MD  predniSONE (DELTASONE) 50 MG tablet Take 1 tablet (50 mg total) daily by mouth. Patient  not taking: Reported on 39/76/7341 93/7/90   Delora Fuel, MD     Allergies:     Allergies  Allergen Reactions  . Lisinopril Swelling    ANGIOEDEMA  . Lamictal [Lamotrigine] Other (See Comments)    Weakness/difficulty swallowing  . Ambien [Zolpidem] Other (See Comments)    Unknown reaction     Physical Exam:   Vitals  Blood pressure 116/77, pulse (!) 120, temperature 99.7 F (37.6 C), temperature source Rectal, resp. rate 14, SpO2 95 %.  1.  General: Patient is very lethargic  2. Psychiatric: Lethargic but arousable, answers questions appropriately  3. Neurologic: No focal neurological deficits, all cranial  nerves intact.Strength 5/5 all 4 extremities, sensation intact all 4 extremities, plantars down going.  4. Eyes :  anicteric sclerae, moist conjunctivae with no lid lag. PERRLA.  5. ENMT:  Oropharynx clear with moist mucous membranes and good dentition  6. Neck:  supple, no cervical lymphadenopathy appriciated, No thyromegaly  7. Respiratory : Normal respiratory effort, bilateral rhonchi auscultated  8. Cardiovascular : RRR, no gallops, rubs or murmurs, no leg edema  9. Gastrointestinal:  Positive bowel sounds, abdomen soft, non-tender to palpation,no hepatosplenomegaly, no rigidity or guarding       10. Skin:  No cyanosis, normal texture and turgor, no rash, lesions or ulcers     Data Review:    CBC Recent Labs  Lab 01/02/17 2224  WBC 14.4*  HGB 10.6*  HCT 33.5*  PLT 285  MCV 96.0  MCH 30.4  MCHC 31.6  RDW 15.8*   ------------------------------------------------------------------------------------------------------------------  Chemistries  Recent Labs  Lab 01/02/17 2224  NA 140  K 3.8  CL 111  CO2 21*  GLUCOSE 70  BUN 15  CREATININE 0.60*  CALCIUM 6.8*   ------------------------------------------------------------------------------------------------------------------  ------------------------------------------------------------------------------------------------------------------ GFR: CrCl cannot be calculated (Unknown ideal weight.). Liver Function Tests: No results for input(s): AST, ALT, ALKPHOS, BILITOT, PROT, ALBUMIN in the last 168 hours. No results for input(s): LIPASE, AMYLASE in the last 168 hours. Recent Labs  Lab 01/02/17 2339  AMMONIA 58*   Coagulation Profile: No results for input(s): INR, PROTIME in the last 168 hours. Cardiac Enzymes: No results for input(s): CKTOTAL, CKMB, CKMBINDEX, TROPONINI in the last 168 hours. BNP (last 3 results) No results for input(s): PROBNP in the last 8760 hours. HbA1C: No results for input(s):  HGBA1C in the last 72 hours. CBG: No results for input(s): GLUCAP in the last 168 hours. Lipid Profile: No results for input(s): CHOL, HDL, LDLCALC, TRIG, CHOLHDL, LDLDIRECT in the last 72 hours. Thyroid Function Tests: No results for input(s): TSH, T4TOTAL, FREET4, T3FREE, THYROIDAB in the last 72 hours. Anemia Panel: No results for input(s): VITAMINB12, FOLATE, FERRITIN, TIBC, IRON, RETICCTPCT in the last 72 hours.  --------------------------------------------------------------------------------------------------------------- Urine analysis:    Component Value Date/Time   COLORURINE YELLOW 09/22/2016 1622   APPEARANCEUR CLEAR 09/22/2016 1622   LABSPEC 1.013 09/22/2016 1622   PHURINE 7.0 09/22/2016 1622   GLUCOSEU NEGATIVE 09/22/2016 1622   HGBUR NEGATIVE 09/22/2016 1622   BILIRUBINUR NEGATIVE 09/22/2016 1622   KETONESUR NEGATIVE 09/22/2016 1622   PROTEINUR NEGATIVE 09/22/2016 1622   UROBILINOGEN 0.2 10/23/2013 1127   NITRITE NEGATIVE 09/22/2016 1622   LEUKOCYTESUR NEGATIVE 09/22/2016 1622      Imaging Results:    Dg Chest Port 1 View  Result Date: 01/02/2017 CLINICAL DATA:  Shortness of breath. End-stage COPD. Decreased oxygen saturation. Decreased responsiveness. History of lung cancer, former smoker. EXAM: PORTABLE CHEST 1 VIEW COMPARISON:  12/16/2016 and 09/22/2016  FINDINGS: Shallow inspiration. Normal heart size and pulmonary vascularity. No focal airspace disease or consolidation in the lungs. No blunting of costophrenic angles. No pneumothorax. Mediastinal contours appear intact. Postoperative changes in the thoracolumbar spine. Patient rotation limits examination. IMPRESSION: No active disease. Electronically Signed   By: Lucienne Capers M.D.   On: 01/02/2017 23:01    My personal review of EKG: Rhythm NSR   Assessment & Plan:    Active Problems:   Essential hypertension   Paroxysmal atrial fibrillation (HCC)   Chronic anticoagulation   Combined systolic and  diastolic congestive heart failure (HCC)   COPD exacerbation ([place])   1. Acute hypercarbic and hypoxic respiratory failure-due to underlying COPD, bronchiectasis.  Continue Solu-Medrol 60 mg IV every 6 hours, Xopenex nebulizer every 6 hours, ipratropium nebulizer every 6 hours, will continue antibiotics empirically for developing pneumonia.  Ceftriaxone 1 g IV daily, Zithromax 500 mg IV daily.  2. Paroxysmal atrial fibrillation-continue metoprolol, Cardizem.  Patient is on anticoagulation with Pradaxa  3. Combined systolic and diastolic CHF-appears well compensated, BNP is 421.2.  Chest x-ray shows no pulmonary edema.  4. Lethargy-improved after treatment for COPD in the ED.  Ammonia level is 58   DVT Prophylaxis-   Pradaxa  AM Labs Ordered, also please review Full Orders  Family Communication: Admission, patients condition and plan of care including tests being ordered have been discussed with the patient's wife at bedside who indicate understanding and agree with the plan and Code Status.  Code Status: DNR  Admission status: Observation  Time spent in minutes : 50 minutes   Oswald Hillock M.D on 01/03/2017 at 1:15 AM  Between 7am to 7pm - Pager - [phone]. After 7pm go to [email or web address] - password [facility]  [facility] - Office  [phone]

## 2017-01-03 NOTE — Progress Notes (Signed)
[place] for lovenox (while home pradaxa on hold) Indication: atrial fibrillation  Allergies  Allergen Reactions  . Lisinopril Swelling    ANGIOEDEMA  . Lamictal [Lamotrigine] Other (See Comments)    Weakness/difficulty swallowing  . Ambien [Zolpidem] Other (See Comments)    Unknown reaction    Patient Measurements: Height: 5\' 9"  (175.3 cm) Weight: 164 lb 14.5 oz (74.8 kg) IBW/kg (Calculated) : 70.7 Heparin Dosing Weight:   Vital Signs: Temp: 98.9 F (37.2 C) (11/24 1321) Temp Source: Axillary (11/24 1321) BP: 125/73 (11/24 1321) Pulse Rate: 120 (11/24 1321)  Labs: Recent Labs    01/02/17 2224 01/03/17 0526  HGB 10.6* 12.7*  HCT 33.5* 38.3*  PLT 285 387  CREATININE 0.60* 0.65    Estimated Creatinine Clearance: 84.7 mL/min (by C-G formula based on SCr of 0.65 mg/dL).   Medications:  Pradaxa 15 mg bid PTA  Assessment: Patient is a 71 y.o M with hx afib on pradaxa PTA presented to the ED on 01/02/17 with c/o SOB and abx started for PNA. Patient is NPO-- To transition anticoagulation to lovenox on 01/03/17.  Last dose of pradaxa taken on 11/23 at 0530  Today, 01/03/2017: - scr 0.65 (crcl~84) - cbc ok.  Goal of Therapy:  Anti-Xa level 0.6-1 units/ml 4hrs after LMWH dose given Monitor platelets by anticoagulation protocol: Yes   Plan:  - lovenox 75 mg SQ 12h - cbc q72h - monitor for s/s bleeding  Layaan Mott P 01/03/2017,4:30 PM
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
## 2017-01-03 NOTE — Consult Note (Signed)
[facility] the patient at the bedside. No family present. This patient has been in hospice services since 12/25/16. Hospice facesheet and Plan of Care with home medications placed on shadow chart. This is a related admission. We will continue to follow and plan to resume services at discharge.  Please call for any questions or concerns.  Yetta Glassman RN [place] Cell: [phone]

## 2017-01-04 DIAGNOSIS — J690 Pneumonitis due to inhalation of food and vomit: Secondary | ICD-10-CM

## 2017-01-04 DIAGNOSIS — Z8673 Personal history of transient ischemic attack (TIA), and cerebral infarction without residual deficits: Secondary | ICD-10-CM

## 2017-01-04 DIAGNOSIS — I672 Cerebral atherosclerosis: Secondary | ICD-10-CM

## 2017-01-04 LAB — BASIC METABOLIC PANEL
Anion gap: 11 (ref 5–15)
BUN: 31 mg/dL — AB (ref 6–20)
CALCIUM: 9.2 mg/dL (ref 8.9–10.3)
CO2: 31 mmol/L (ref 22–32)
CREATININE: 0.9 mg/dL (ref 0.61–1.24)
Chloride: 100 mmol/L — ABNORMAL LOW (ref 101–111)
GFR calc Af Amer: >60 mL/min (ref >60)
Glucose, Bld: 127 mg/dL — ABNORMAL HIGH (ref 65–99)
Potassium: 3.5 mmol/L (ref 3.5–5.1)
SODIUM: 142 mmol/L (ref 135–145)

## 2017-01-04 LAB — RESPIRATORY PANEL BY PCR
Adenovirus: NOT DETECTED
BORDETELLA PERTUSSIS-RVPCR: NOT DETECTED
CORONAVIRUS 229E-RVPPCR: NOT DETECTED
CORONAVIRUS HKU1-RVPPCR: NOT DETECTED
CORONAVIRUS OC43-RVPPCR: NOT DETECTED
Chlamydophila pneumoniae: NOT DETECTED
Coronavirus NL63: NOT DETECTED
INFLUENZA B-RVPPCR: NOT DETECTED
Influenza A: NOT DETECTED
MYCOPLASMA PNEUMONIAE-RVPPCR: NOT DETECTED
Metapneumovirus: NOT DETECTED
PARAINFLUENZA VIRUS 1-RVPPCR: NOT DETECTED
PARAINFLUENZA VIRUS 2-RVPPCR: NOT DETECTED
Parainfluenza Virus 3: NOT DETECTED
Parainfluenza Virus 4: NOT DETECTED
RESPIRATORY SYNCYTIAL VIRUS-RVPPCR: NOT DETECTED
Rhinovirus / Enterovirus: NOT DETECTED

## 2017-01-04 LAB — CBC
HCT: 34.2 % — ABNORMAL LOW (ref 39.0–52.0)
Hemoglobin: 11.3 g/dL — ABNORMAL LOW (ref 13.0–17.0)
MCH: 30.4 pg (ref 26.0–34.0)
MCHC: 33 g/dL (ref 30.0–36.0)
MCV: 91.9 fL (ref 78.0–100.0)
PLATELETS: 409 10*3/uL — AB (ref 150–400)
RBC: 3.72 MIL/uL — ABNORMAL LOW (ref 4.22–5.81)
RDW: 15.5 % (ref 11.5–15.5)
WBC: 12.8 10*3/uL — AB (ref 4.0–10.5)

## 2017-01-04 MED ORDER — LACTULOSE 10 GM/15ML PO SOLN
10.0000 g | Freq: Every day | ORAL | Status: DC
  Administered 2017-01-04: 10 g via ORAL
  Filled 2017-01-04 (×2): qty 30

## 2017-01-04 MED ORDER — METOPROLOL TARTRATE 50 MG PO TABS
50.0000 mg | ORAL_TABLET | Freq: Two times a day (BID) | ORAL | Status: DC
  Administered 2017-01-04 – 2017-01-06 (×5): 50 mg via ORAL
  Filled 2017-01-04 (×5): qty 1

## 2017-01-04 MED ORDER — DABIGATRAN ETEXILATE MESYLATE 150 MG PO CAPS
150.0000 mg | ORAL_CAPSULE | Freq: Two times a day (BID) | ORAL | Status: DC
  Administered 2017-01-04 – 2017-01-06 (×4): 150 mg via ORAL
  Filled 2017-01-04 (×4): qty 1

## 2017-01-04 MED ORDER — FAMOTIDINE 20 MG PO TABS
20.0000 mg | ORAL_TABLET | Freq: Every day | ORAL | Status: DC
  Administered 2017-01-04 – 2017-01-06 (×3): 20 mg via ORAL
  Filled 2017-01-04 (×3): qty 1

## 2017-01-04 MED ORDER — DABIGATRAN ETEXILATE MESYLATE 150 MG PO CAPS
150.0000 mg | ORAL_CAPSULE | Freq: Two times a day (BID) | ORAL | Status: DC
Start: 2017-01-04 — End: 2017-01-04
  Filled 2017-01-04: qty 1

## 2017-01-04 MED ORDER — DIAZEPAM 5 MG PO TABS
5.0000 mg | ORAL_TABLET | Freq: Two times a day (BID) | ORAL | Status: DC | PRN
  Administered 2017-01-05 (×2): 5 mg via ORAL
  Filled 2017-01-04 (×2): qty 1

## 2017-01-04 MED ORDER — METHYLPREDNISOLONE SODIUM SUCC 125 MG IJ SOLR
60.0000 mg | Freq: Two times a day (BID) | INTRAMUSCULAR | Status: DC
  Administered 2017-01-04 – 2017-01-06 (×4): 60 mg via INTRAVENOUS
  Filled 2017-01-04 (×5): qty 2

## 2017-01-04 MED ORDER — HYDROCODONE-ACETAMINOPHEN 10-325 MG PO TABS
1.0000 | ORAL_TABLET | Freq: Four times a day (QID) | ORAL | Status: DC
  Administered 2017-01-04 – 2017-01-06 (×8): 1 via ORAL
  Filled 2017-01-04 (×8): qty 1
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
## 2017-01-05 LAB — BASIC METABOLIC PANEL
ANION GAP: 12 (ref 5–15)
BUN: 26 mg/dL — ABNORMAL HIGH (ref 6–20)
CO2: 29 mmol/L (ref 22–32)
Calcium: 9.2 mg/dL (ref 8.9–10.3)
Chloride: 98 mmol/L — ABNORMAL LOW (ref 101–111)
Creatinine, Ser: 0.73 mg/dL (ref 0.61–1.24)
GFR calc Af Amer: >60 mL/min (ref >60)
GLUCOSE: 80 mg/dL (ref 65–99)
Potassium: 3.4 mmol/L — ABNORMAL LOW (ref 3.5–5.1)
Sodium: 139 mmol/L (ref 135–145)

## 2017-01-05 LAB — CBC
HEMATOCRIT: 39.2 % (ref 39.0–52.0)
HEMOGLOBIN: 13.3 g/dL (ref 13.0–17.0)
MCH: 31.2 pg (ref 26.0–34.0)
MCHC: 33.9 g/dL (ref 30.0–36.0)
MCV: 92 fL (ref 78.0–100.0)
Platelets: 353 10*3/uL (ref 150–400)
RBC: 4.26 MIL/uL (ref 4.22–5.81)
RDW: 15.6 % — ABNORMAL HIGH (ref 11.5–15.5)
WBC: 11.5 10*3/uL — AB (ref 4.0–10.5)

## 2017-01-05 MED ORDER — BUDESONIDE 0.5 MG/2ML IN SUSP
0.5000 mg | Freq: Two times a day (BID) | RESPIRATORY_TRACT | Status: DC
  Administered 2017-01-05 – 2017-01-06 (×2): 0.5 mg via RESPIRATORY_TRACT
  Filled 2017-01-05 (×2): qty 2

## 2017-01-05 MED ORDER — IPRATROPIUM-ALBUTEROL 0.5-2.5 (3) MG/3ML IN SOLN
3.0000 mL | Freq: Four times a day (QID) | RESPIRATORY_TRACT | Status: DC
  Administered 2017-01-06 (×2): 3 mL via RESPIRATORY_TRACT
  Filled 2017-01-05 (×2): qty 3

## 2017-01-05 MED ORDER — SODIUM CHLORIDE 3 % IN NEBU
4.0000 mL | INHALATION_SOLUTION | Freq: Two times a day (BID) | RESPIRATORY_TRACT | Status: DC
  Administered 2017-01-05 – 2017-01-06 (×2): 4 mL via RESPIRATORY_TRACT
  Filled 2017-01-05 (×3): qty 4

## 2017-01-05 MED ORDER — ENSURE ENLIVE PO LIQD
237.0000 mL | Freq: Two times a day (BID) | ORAL | Status: DC
  Administered 2017-01-05 – 2017-01-06 (×2): 237 mL via ORAL

## 2017-01-05 MED ORDER — IPRATROPIUM-ALBUTEROL 0.5-2.5 (3) MG/3ML IN SOLN
3.0000 mL | RESPIRATORY_TRACT | Status: DC
  Administered 2017-01-05 (×2): 3 mL via RESPIRATORY_TRACT
  Filled 2017-01-05 (×2): qty 3

## 2017-01-05 MED ORDER — IPRATROPIUM-ALBUTEROL 0.5-2.5 (3) MG/3ML IN SOLN: 3.0000 mL | Freq: Four times a day (QID) | RESPIRATORY_TRACT | Status: DC

## 2017-01-05 NOTE — Care Management Note (Signed)
Case Management Note  Patient Details  Name: Malik Zuniga MRN: [id number] Date of Birth: 1945/08/30  Subjective/Objective: 71 y/o m admitted w/COPD. From home. Hx:DNR. Active w/[facility]-home hospice-TC Cheri confirmed patient is active-they have already ordered home 02 10 liter concentrator. Continue to monitor d/c needs.                   Action/Plan:d/c plan home w/continued home hospice.   Expected Discharge Date:                  Expected Discharge Plan:  Home w Hospice Care  In-House Referral:     Discharge planning Services  CM Consult  Post Acute Care Choice:  Hospice(Active w/[facility]-home hospice) Choice offered to:     DME Arranged:    DME Agency:     HH Arranged:    HH Agency:     Status of Service:  In process, will continue to follow  If discussed at Long Length of Stay Meetings, dates discussed:    Additional Comments:  Dessa Phi, RN 01/05/2017, 2:31 PM
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
## 2017-01-05 NOTE — Consult Note (Signed)
[place]: Met with pt no family at bedside. He is unsure of discharge date or plan. He will be returning back home with his wife when stable and MD feels he is ready for discharge with Hospice services. The pt is polite and has his oxygen on. It is noted that he is on 8 liters of oxygen. His norm at home was 2 liters at night. We will have his oxygen concentrator replaced with a 10 liter concentrator in event that he will need more oxygen at home than what he had previously. His concentrator at home will only do 5 iters max at this time. Please call with any concerns. Webb Silversmith RN [phone]

## 2017-01-06 LAB — BASIC METABOLIC PANEL
Anion gap: 11 (ref 5–15)
BUN: 14 mg/dL (ref 6–20)
CO2: 28 mmol/L (ref 22–32)
Calcium: 8.9 mg/dL (ref 8.9–10.3)
Chloride: 96 mmol/L — ABNORMAL LOW (ref 101–111)
Creatinine, Ser: 0.72 mg/dL (ref 0.61–1.24)
GFR calc Af Amer: >60 mL/min (ref >60)
GFR calc non Af Amer: >60 mL/min (ref >60)
Glucose, Bld: 106 mg/dL — ABNORMAL HIGH (ref 65–99)
Potassium: 3.5 mmol/L (ref 3.5–5.1)
Sodium: 135 mmol/L (ref 135–145)

## 2017-01-06 MED ORDER — CHLORHEXIDINE GLUCONATE 0.12 % MT SOLN: 15.0000 mL | Freq: Two times a day (BID) | OROMUCOSAL | 0 refills | Status: AC
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
## 2017-01-06 NOTE — Care Management Note (Signed)
Case Management Note  Patient Details  Name: Malik Zuniga MRN: [id number] Date of Birth: [birth year]  Subjective/Objective: Noted per nurse-Family did not bring travel home 02 tank to hospital for d/c. [facility] rep Santiago Glad for delivery of travel home 02 tank to patient's rm prior d/c.  Confirmed w/[facility]-home 02 10 liter concentrator has been delivered to home. No further CM needs.                   Action/Plan:d/c home w/home hospice/home 02 travel tank   Expected Discharge Date:  01/06/17               Expected Discharge Plan:  Home w Hospice Care  In-House Referral:     Discharge planning Services  CM Consult  Post Acute Care Choice:  Hospice(Active w/[facility]-home hospice) Choice offered to:     DME Arranged:    DME Agency:     HH Arranged:    [place] Agency:     Status of Service:  Completed, signed off  If discussed at [employer], dates discussed:    Additional Comments:  Dessa Phi, RN 01/06/2017, 11:06 AM

## 2017-01-06 NOTE — Progress Notes (Signed)
Pt discharged to home with wife instructions reviewed with patient and wife. Pt sent home with travel O2. SRP, RN

## 2017-01-06 NOTE — Care Management Important Message (Signed)
Important Message  Patient Details  Name: Malik Zuniga MRN: [id number] Date of Birth: 05-30-1945   Medicare Important Message Given:  Yes    Kerin Salen 01/06/2017, 10:50 AMImportant Message  Patient Details  Name: Malik Zuniga MRN: [id number] Date of Birth: 1945/04/16   Medicare Important Message Given:  Yes    Kerin Salen 01/06/2017, 10:48 AM

## 2017-01-06 NOTE — Progress Notes (Signed)
Pt O2 decreased to 5L-- current sat of 8 L 95-96 %. Will recheck in one hour. SRP, RN

## 2017-01-06 NOTE — Progress Notes (Signed)
Pt currently on 5L O2, sats range 89-92% pt states this is his home baseline.  Will cont to monitor. SRP, RN

## 2017-01-07 DIAGNOSIS — R1310 Dysphagia, unspecified: Secondary | ICD-10-CM | POA: Diagnosis not present

## 2017-01-07 DIAGNOSIS — I110 Hypertensive heart disease with heart failure: Secondary | ICD-10-CM | POA: Diagnosis not present

## 2017-01-07 DIAGNOSIS — J479 Bronchiectasis, uncomplicated: Secondary | ICD-10-CM | POA: Diagnosis not present

## 2017-01-07 DIAGNOSIS — Z9981 Dependence on supplemental oxygen: Secondary | ICD-10-CM | POA: Diagnosis not present

## 2017-01-07 DIAGNOSIS — J449 Chronic obstructive pulmonary disease, unspecified: Secondary | ICD-10-CM | POA: Diagnosis not present

## 2017-01-07 DIAGNOSIS — I5042 Chronic combined systolic (congestive) and diastolic (congestive) heart failure: Secondary | ICD-10-CM | POA: Diagnosis not present

## 2017-01-08 ENCOUNTER — Telehealth: Payer: Self-pay | Admitting: Pulmonary Disease

## 2017-01-08 MED ORDER — AZITHROMYCIN 250 MG PO TABS: 250.0000 mg | ORAL_TABLET | Freq: Once | ORAL | 0 refills | Status: AC

## 2017-01-08 NOTE — Telephone Encounter (Signed)
OK to Rx zpack Needs OV with [place]

## 2017-01-08 NOTE — Telephone Encounter (Signed)
Spoke with pt's wife, [name] who states that she thinks pt needs to be put back on zithromax. When pt was discharged from the hospital this last time on 11/27, pt was taken off of zithromax.  [name] wants to try to keep her husband out of the hospital and zithromax has seen to help.  Dr. [name], please advise if it is okay for [place] to put pt back on zithromax.

## 2017-01-08 NOTE — Telephone Encounter (Signed)
Spoke with [name] letting her know [facility] agreed to fill zithromax. Scheduled hospital follow-up with TP tomorrow at 11:30.

## 2017-01-09 ENCOUNTER — Ambulatory Visit (INDEPENDENT_AMBULATORY_CARE_PROVIDER_SITE_OTHER): Payer: Medicare Other | Admitting: Adult Health

## 2017-01-09 ENCOUNTER — Encounter: Payer: Self-pay | Admitting: Adult Health

## 2017-01-09 DIAGNOSIS — R1310 Dysphagia, unspecified: Secondary | ICD-10-CM | POA: Diagnosis not present

## 2017-01-09 DIAGNOSIS — J449 Chronic obstructive pulmonary disease, unspecified: Secondary | ICD-10-CM | POA: Diagnosis not present

## 2017-01-09 DIAGNOSIS — J9611 Chronic respiratory failure with hypoxia: Secondary | ICD-10-CM | POA: Diagnosis not present

## 2017-01-09 DIAGNOSIS — I110 Hypertensive heart disease with heart failure: Secondary | ICD-10-CM | POA: Diagnosis not present

## 2017-01-09 DIAGNOSIS — J479 Bronchiectasis, uncomplicated: Secondary | ICD-10-CM

## 2017-01-09 DIAGNOSIS — Z9981 Dependence on supplemental oxygen: Secondary | ICD-10-CM | POA: Diagnosis not present

## 2017-01-09 DIAGNOSIS — I5042 Chronic combined systolic (congestive) and diastolic (congestive) heart failure: Secondary | ICD-10-CM | POA: Diagnosis not present

## 2017-01-09 DIAGNOSIS — I25118 Atherosclerotic heart disease of native coronary artery with other forms of angina pectoris: Secondary | ICD-10-CM

## 2017-01-09 NOTE — Progress Notes (Signed)
@Patient  ID: Malik Zuniga, male    DOB: May 21, 1945, 71 y.o.   MRN: [id number]  Chief Complaint  Patient presents with  . Follow-up    Bronchiectaiss     Referring provider: [name], MD  HPI: 71 yo male followed for COPD , O2 RF , Bronchiectasis and recurrent aspiration PNA  Lung Cancer with adenocarcinoma tx /w SBRT in 2016.   TEST  [name] 04/2009: FEV1 1.65 (47%), FEV1% 49   01/09/2017 Follow up : Bronchiectasis /Post hospital follow up  Pt was recently for a slow to resolve COPD /Bronchiectasis exacerbation . He was treated with aggressive pulmonary hygiene regimen with VEST , flutter and nebs. He was tx with IV abx and steroids . CXR showed chronic changes . Prior to admission was started hospice care at home . They have recommended fentanyl patches. He says since discharge he remains very weak. Is eating but appetite is low. Breathing is at baseline , dyspnea at rest. Now on Oxygen 24/7. He required 2.5 l/m at rest. He sleeps with 5l/m At bedtime .  He is currently tapering prednisone down to baseline prednisone 10mg  daily  Would like azithromycin rx sent to hospice pharmacy , he has been on daily dosing .  Denies hemoptysis , chest pain or fever.      Allergies  Allergen Reactions  . Lisinopril Swelling    ANGIOEDEMA  . Lamictal [Lamotrigine] Other (See Comments)    Weakness/difficulty swallowing  . Ambien [Zolpidem] Other (See Comments)    Unknown reaction    Immunization History  Administered Date(s) Administered  . Influenza Split 01/31/2011, 10/30/2011  . Influenza Whole 11/10/2009  . Influenza, High Dose Seasonal PF 12/16/2012  . Influenza,inj,Quad PF,6+ Mos 10/12/2013, 12/12/2014, 10/10/2015, 10/06/2016  . Pneumococcal Conjugate-13 08/23/2013  . Pneumococcal Polysaccharide-23 12/19/2004, 10/30/2016  . Td 05/19/2007  . Zoster 07/11/2008    Past Medical History:  Diagnosis Date  . Anemia associated with acute blood loss   . Angioedema  10/31/2010  . Anxiety    takes Valium daily  . Aspiration pneumonia ([place]) 2010  . Asthma   . Bacteremia due to Pseudomonas 10/04/2011   D/t gram neg rods   . Bipolar affective disorder ([place])   . 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 Chronic back pain    compression fracture  . Constipation    takes Colace daily as well as Miralax  . Coronary artery disease   . Depression    takes Cymbalta daily  . Emphysema of lung (HCC)    Albuterol as needed;Symbicort daily and Singulair at bedtime  . GERD (gastroesophageal reflux disease)    takes Omeprazole daily  . Headache, chronic daily   . History of bronchitis   . History of colon polyps   . History of migraine    last migraine a couple of days ago;takes Excedrin Migraine  . History of prostate cancer 2004  . Hyperlipidemia    takes Simvastatin daily  . Hypertension    takes Metoprolol daily as well as Hyzaar  . Insomnia    takes Benadryl nightly  . Lung cancer ([place]) 2016  . Mood change    after Brain surgery mood changed and was placed on Depakote  . Myocardial infarction ([place]) 04/1998  . Neuromuscular disorder ([place]) 1998   right carpal tunnel release  . On home O2   . Shortness of breath    with exertion  .  Stroke [facility]) 1998   Brain Aneurysm  . Tremor     Tobacco History: Social History   Tobacco Use  Smoking Status Former Smoker  . Packs/day: 1.50  . Years: 52.00  . Pack years: 78.00  . Types: Cigarettes  . Last attempt to quit: 07/17/2012  . Years since quitting: 4.4  Smokeless Tobacco Never Used  Tobacco Comment   Former smoker   Counseling given: Not Answered Comment: Former smoker   Outpatient Encounter Medications as of 01/09/2017  Medication Sig  . ARIPiprazole (ABILIFY) 10 MG tablet Take 5 mg by mouth daily after breakfast.   . aspirin-acetaminophen-caffeine (EXCEDRIN MIGRAINE) 250-250-65 MG tablet Take 2 tablets by mouth 3 (three) times daily.   Marland Kitchen atorvastatin  (LIPITOR) 20 MG tablet TAKE 1 TABLET (20 MG TOTAL) BY MOUTH DAILY AT 6 PM.  . budesonide (PULMICORT) 0.25 MG/2ML nebulizer solution Take 4 mLs (0.5 mg total) by nebulization 2 (two) times daily.  . chlorhexidine (PERIDEX) 0.12 % solution 15 mLs by Mouth Rinse route 2 (two) times daily.  . cholecalciferol (VITAMIN D) 1000 UNITS tablet Take 1,000 Units by mouth every morning.   . dabigatran (PRADAXA) 150 MG CAPS capsule Take 1 capsule (150 mg total) by mouth every 12 (twelve) hours.  . diazepam (VALIUM) 5 MG tablet Take 1 tablet (5 mg total) by mouth every 12 (twelve) hours as needed for anxiety or muscle spasms. scheduled  . divalproex (DEPAKOTE) 500 MG 24 hr tablet Take 1,000 mg by mouth at bedtime.   . docusate sodium (COLACE) 100 MG capsule Take 200 mg by mouth at bedtime.   . DULoxetine (CYMBALTA) 60 MG capsule Take 120 mg by mouth daily after breakfast.   . feeding supplement, ENSURE ENLIVE, (ENSURE ENLIVE) LIQD Take 237 mLs by mouth daily.  Marland Kitchen ipratropium-albuterol (DUONEB) 0.5-2.5 (3) MG/3ML SOLN Take 3 mLs by nebulization every 6 (six) hours.  . montelukast (SINGULAIR) 10 MG tablet TAKE 1 TABLET AT BEDTIME  . nitroGLYCERIN (NITROSTAT) 0.4 MG SL tablet PLACE 1 TABLET (0.4 MG TOTAL) UNDER THE TONGUE EVERY 5 (FIVE) MINUTES AS NEEDED FOR CHEST PAIN.  Marland Kitchen omeprazole (PRILOSEC) 40 MG capsule Take 40 mg by mouth 2 (two) times daily.   . ondansetron (ZOFRAN) 4 MG tablet Take 1 tablet (4 mg total) by mouth every 8 (eight) hours as needed for nausea or vomiting.  . OXYGEN Inhale 2.5 L into the lungs. At bedtime and prn throughout day.  . polyethylene glycol (MIRALAX / GLYCOLAX) packet Take 17 g by mouth every evening.   . predniSONE (DELTASONE) 10 MG tablet 4 tabs for 3 days, then 3 tabs for 3 days, 2 tabs for 3 days, then 1 tab daily (Patient taking differently: Take 10 mg by mouth daily with breakfast. )  . primidone (MYSOLINE) 250 MG tablet TAKE 1 TABLET TWICE DAILY  . ranitidine (ZANTAC) 150 MG  tablet Take 300 mg by mouth 2 (two) times daily.   . sodium chloride HYPERTONIC 3 % nebulizer solution Take by nebulization 2 (two) times daily.  . traZODone (DESYREL) 50 MG tablet Take 50 mg by mouth at bedtime.  Marland Kitchen diltiazem (CARDIZEM CD) 240 MG 24 hr capsule Take 1 capsule (240 mg total) by mouth daily. (Patient taking differently: Take 240 mg by mouth daily after breakfast. )  . metoprolol tartrate (LOPRESSOR) 50 MG tablet Take 1 tablet (50 mg total) by mouth 2 (two) times daily.  . [DISCONTINUED] HYDROcodone-acetaminophen (NORCO) 10-325 MG tablet Take 1 tablet by mouth 4 (four)  times daily. (Patient not taking: Reported on 01/09/2017)  . [DISCONTINUED] lactulose (CHRONULAC) 10 GM/15ML solution Take 30 mLs (20 g total) by mouth daily. (Patient not taking: Reported on 01/09/2017)   No facility-administered encounter medications on file as of 01/09/2017.      Review of Systems  Constitutional:   No  weight loss, night sweats,  Fevers, chills, + fatigue, or  lassitude.  HEENT:   No headaches,  Difficulty swallowing,  Tooth/dental problems, or  Sore throat,                No sneezing, itching, ear ache,  +nasal congestion, post nasal drip,   CV:  No chest pain,  Orthopnea, PND,  , anasarca, dizziness, palpitations, syncope.   GI  No heartburn, indigestion, abdominal pain, nausea, vomiting, diarrhea, change in bowel habits, loss of appetite, bloody stools.   Resp:    No chest wall deformity  Skin: no rash or lesions.  GU: no dysuria, change in color of urine, no urgency or frequency.  No flank pain, no hematuria   MS:  No joint pain or swelling.  No decreased range of motion.  No back pain.    Physical Exam  BP 112/68 (BP Location: Left Arm, Cuff Size: Normal)   Pulse 84   Ht 5\' 10"  (1.778 m)   Wt 164 lb (74.4 kg)   SpO2 90%   BMI 23.53 kg/m   GEN: A/Ox3; pleasant , NAD, chronically ill appearing in wc    HEENT:  [place]/AT,  EACs-clear, TMs-wnl, NOSE-clear, THROAT-clear, no  lesions, no postnasal drip or exudate noted. Chronic head drop   NECK:  Supple w/ fair ROM; no JVD; normal carotid impulses w/o bruits; no thyromegaly or nodules palpated; no lymphadenopathy.    RESP  Scattered rhonchi ,  no accessory muscle use, no dullness to percussion  CARD:  RRR, no m/r/g, tr  peripheral edema, pulses intact, no cyanosis or clubbing.  GI:   Soft & nt; nml bowel sounds; no organomegaly or masses detected.   Musco: Warm bil, no deformities or joint swelling noted.   Neuro: alert, no focal deficits noted.    Skin: Warm, no lesions or rashes    Lab Results:  CBC   BNP    Component Value Date/Time   BNP 421.2 (H) 01/02/2017 2225    ProBNP    Component Value Date/Time   PROBNP 51.0 04/13/2007 1525    Imaging: Dg Chest 2 View  Result Date: 12/16/2016 CLINICAL DATA:  Atrial fibrillation shortness of breath EXAM: CHEST  2 VIEW COMPARISON:  09/22/2016 FINDINGS: Limited by habitus and positioning. Kyphosis with partially visualized spinal hardware. No focal consolidation or large effusion. Stable cardiomediastinal silhouette. IMPRESSION: Limited study due to positioning and habitus. No definite acute interval change Electronically Signed   By: Donavan Foil M.D.   On: 12/16/2016 00:26   Dg Chest Port 1 View  Result Date: 01/02/2017 CLINICAL DATA:  Shortness of breath. End-stage COPD. Decreased oxygen saturation. Decreased responsiveness. History of lung cancer, former smoker. EXAM: PORTABLE CHEST 1 VIEW COMPARISON:  12/16/2016 and 09/22/2016 FINDINGS: Shallow inspiration. Normal heart size and pulmonary vascularity. No focal airspace disease or consolidation in the lungs. No blunting of costophrenic angles. No pneumothorax. Mediastinal contours appear intact. Postoperative changes in the thoracolumbar spine. Patient rotation limits examination. IMPRESSION: No active disease. Electronically Signed   By: Lucienne Capers M.D.   On: 01/02/2017 23:01      Assessment & Plan:   Bronchiectasis without  acute exacerbation (HCC) Recent Bronchiectasis and COPD exacerbation slow to resolve . Pt now in hospice  Will continue current regimen .   Plan  . Patient Instructions  Continue with Hospice care.  Continue on prednisone 10mg  daily  Continue on VEST and Flutter.  Continue on Pulmicort  And Hypertonic Neb Twice daily   Continue on Duoneb Four times a day  .  Continue on Azithromycin 250mg  daily.  Continue on oxygen 2.5l/m rest and 5l/m At bedtime   Follow up with Dr. [name] in 3 months and As needed   Please contact office for sooner follow up if symptoms do not improve or worsen or seek emergency care       Chronic respiratory failure with hypoxia ([place]) Cont on O2 . To keep sats >88-90%.       Rexene Edison, NP 01/09/2017

## 2017-01-09 NOTE — Patient Instructions (Signed)
Continue with Hospice care.  Continue on prednisone 10mg  daily  Continue on VEST and Flutter.  Continue on Pulmicort  And Hypertonic Neb Twice daily   Continue on Duoneb Four times a day  .  Continue on Azithromycin 250mg  daily.  Continue on oxygen 2.5l/m rest and 5l/m At bedtime   Follow up with Dr. [name] in 3 months and As needed   Please contact office for sooner follow up if symptoms do not improve or worsen or seek emergency care

## 2017-01-09 NOTE — Assessment & Plan Note (Signed)
Cont on O2 . To keep sats >88-90%.

## 2017-01-09 NOTE — Progress Notes (Signed)
Reviewed, discussed with Malik Zuniga in clinic and I briefly met with the patient and his wife to offer comfort/support.

## 2017-01-09 NOTE — Assessment & Plan Note (Signed)
Recent Bronchiectasis and COPD exacerbation slow to resolve . Pt now in hospice  Will continue current regimen .   Plan  . Patient Instructions  Continue with Hospice care.  Continue on prednisone 10mg  daily  Continue on VEST and Flutter.  Continue on Pulmicort  And Hypertonic Neb Twice daily   Continue on Duoneb Four times a day  .  Continue on Azithromycin 250mg  daily.  Continue on oxygen 2.5l/m rest and 5l/m At bedtime   Follow up with Dr. [name] in 3 months and As needed   Please contact office for sooner follow up if symptoms do not improve or worsen or seek emergency care

## 2017-01-10 DIAGNOSIS — R1310 Dysphagia, unspecified: Secondary | ICD-10-CM | POA: Diagnosis not present

## 2017-01-10 DIAGNOSIS — Z87891 Personal history of nicotine dependence: Secondary | ICD-10-CM | POA: Diagnosis not present

## 2017-01-10 DIAGNOSIS — J449 Chronic obstructive pulmonary disease, unspecified: Secondary | ICD-10-CM | POA: Diagnosis not present

## 2017-01-10 DIAGNOSIS — M549 Dorsalgia, unspecified: Secondary | ICD-10-CM | POA: Diagnosis not present

## 2017-01-10 DIAGNOSIS — I4892 Unspecified atrial flutter: Secondary | ICD-10-CM | POA: Diagnosis not present

## 2017-01-10 DIAGNOSIS — I110 Hypertensive heart disease with heart failure: Secondary | ICD-10-CM | POA: Diagnosis not present

## 2017-01-10 DIAGNOSIS — G8929 Other chronic pain: Secondary | ICD-10-CM | POA: Diagnosis not present

## 2017-01-10 DIAGNOSIS — I672 Cerebral atherosclerosis: Secondary | ICD-10-CM | POA: Diagnosis not present

## 2017-01-10 DIAGNOSIS — I2510 Atherosclerotic heart disease of native coronary artery without angina pectoris: Secondary | ICD-10-CM | POA: Diagnosis not present

## 2017-01-10 DIAGNOSIS — G43909 Migraine, unspecified, not intractable, without status migrainosus: Secondary | ICD-10-CM | POA: Diagnosis not present

## 2017-01-10 DIAGNOSIS — R251 Tremor, unspecified: Secondary | ICD-10-CM | POA: Diagnosis not present

## 2017-01-10 DIAGNOSIS — J479 Bronchiectasis, uncomplicated: Secondary | ICD-10-CM | POA: Diagnosis not present

## 2017-01-10 DIAGNOSIS — Z85118 Personal history of other malignant neoplasm of bronchus and lung: Secondary | ICD-10-CM | POA: Diagnosis not present

## 2017-01-10 DIAGNOSIS — I4891 Unspecified atrial fibrillation: Secondary | ICD-10-CM | POA: Diagnosis not present

## 2017-01-10 DIAGNOSIS — Z9981 Dependence on supplemental oxygen: Secondary | ICD-10-CM | POA: Diagnosis not present

## 2017-01-10 DIAGNOSIS — I5042 Chronic combined systolic (congestive) and diastolic (congestive) heart failure: Secondary | ICD-10-CM | POA: Diagnosis not present

## 2017-01-10 DIAGNOSIS — Z8701 Personal history of pneumonia (recurrent): Secondary | ICD-10-CM | POA: Diagnosis not present

## 2017-01-12 ENCOUNTER — Ambulatory Visit: Payer: Medicare Other | Admitting: Family Medicine

## 2017-01-13 DIAGNOSIS — I5042 Chronic combined systolic (congestive) and diastolic (congestive) heart failure: Secondary | ICD-10-CM | POA: Diagnosis not present

## 2017-01-13 DIAGNOSIS — J449 Chronic obstructive pulmonary disease, unspecified: Secondary | ICD-10-CM | POA: Diagnosis not present

## 2017-01-13 DIAGNOSIS — R1310 Dysphagia, unspecified: Secondary | ICD-10-CM | POA: Diagnosis not present

## 2017-01-13 DIAGNOSIS — I110 Hypertensive heart disease with heart failure: Secondary | ICD-10-CM | POA: Diagnosis not present

## 2017-01-13 DIAGNOSIS — Z9981 Dependence on supplemental oxygen: Secondary | ICD-10-CM | POA: Diagnosis not present

## 2017-01-13 DIAGNOSIS — J479 Bronchiectasis, uncomplicated: Secondary | ICD-10-CM | POA: Diagnosis not present

## 2017-01-14 ENCOUNTER — Encounter: Payer: Self-pay | Admitting: Family Medicine

## 2017-01-14 DIAGNOSIS — J479 Bronchiectasis, uncomplicated: Secondary | ICD-10-CM | POA: Diagnosis not present

## 2017-01-14 DIAGNOSIS — J449 Chronic obstructive pulmonary disease, unspecified: Secondary | ICD-10-CM | POA: Diagnosis not present

## 2017-01-14 DIAGNOSIS — R1310 Dysphagia, unspecified: Secondary | ICD-10-CM | POA: Diagnosis not present

## 2017-01-14 DIAGNOSIS — I5042 Chronic combined systolic (congestive) and diastolic (congestive) heart failure: Secondary | ICD-10-CM | POA: Diagnosis not present

## 2017-01-14 DIAGNOSIS — Z9981 Dependence on supplemental oxygen: Secondary | ICD-10-CM | POA: Diagnosis not present

## 2017-01-14 DIAGNOSIS — I110 Hypertensive heart disease with heart failure: Secondary | ICD-10-CM | POA: Diagnosis not present

## 2017-01-16 DIAGNOSIS — J479 Bronchiectasis, uncomplicated: Secondary | ICD-10-CM | POA: Diagnosis not present

## 2017-01-16 DIAGNOSIS — R1310 Dysphagia, unspecified: Secondary | ICD-10-CM | POA: Diagnosis not present

## 2017-01-16 DIAGNOSIS — I5042 Chronic combined systolic (congestive) and diastolic (congestive) heart failure: Secondary | ICD-10-CM | POA: Diagnosis not present

## 2017-01-16 DIAGNOSIS — I110 Hypertensive heart disease with heart failure: Secondary | ICD-10-CM | POA: Diagnosis not present

## 2017-01-16 DIAGNOSIS — J449 Chronic obstructive pulmonary disease, unspecified: Secondary | ICD-10-CM | POA: Diagnosis not present

## 2017-01-16 DIAGNOSIS — Z9981 Dependence on supplemental oxygen: Secondary | ICD-10-CM | POA: Diagnosis not present

## 2017-01-17 DIAGNOSIS — J479 Bronchiectasis, uncomplicated: Secondary | ICD-10-CM | POA: Diagnosis not present

## 2017-01-17 DIAGNOSIS — R1310 Dysphagia, unspecified: Secondary | ICD-10-CM | POA: Diagnosis not present

## 2017-01-17 DIAGNOSIS — Z9981 Dependence on supplemental oxygen: Secondary | ICD-10-CM | POA: Diagnosis not present

## 2017-01-17 DIAGNOSIS — J449 Chronic obstructive pulmonary disease, unspecified: Secondary | ICD-10-CM | POA: Diagnosis not present

## 2017-01-17 DIAGNOSIS — I5042 Chronic combined systolic (congestive) and diastolic (congestive) heart failure: Secondary | ICD-10-CM | POA: Diagnosis not present

## 2017-01-17 DIAGNOSIS — I110 Hypertensive heart disease with heart failure: Secondary | ICD-10-CM | POA: Diagnosis not present

## 2017-01-26 ENCOUNTER — Telehealth: Payer: Self-pay | Admitting: Pulmonary Disease

## 2017-01-26 NOTE — Telephone Encounter (Signed)
I will pass this message along to BQ.

## 2017-01-27 NOTE — Telephone Encounter (Signed)
I'm sorry to hear this Will reach out to express my condolences

## 2017-02-10 DIAGNOSIS — 419620001 Death: Secondary | SNOMED CT | POA: Diagnosis not present

## 2017-02-10 DEATH — deceased

## 2017-03-03 ENCOUNTER — Ambulatory Visit: Payer: Medicare Other | Admitting: Family Medicine

## 2017-07-02 IMAGING — RF DG SWALLOWING FUNCTION - NRPT [facility]
2 series · 18 of 24 positions shown · non-contrast
Comparison: none

[Series 1: run · 48 acquisitions, 16 frames shown (1 of 2)]
[im 1/48]
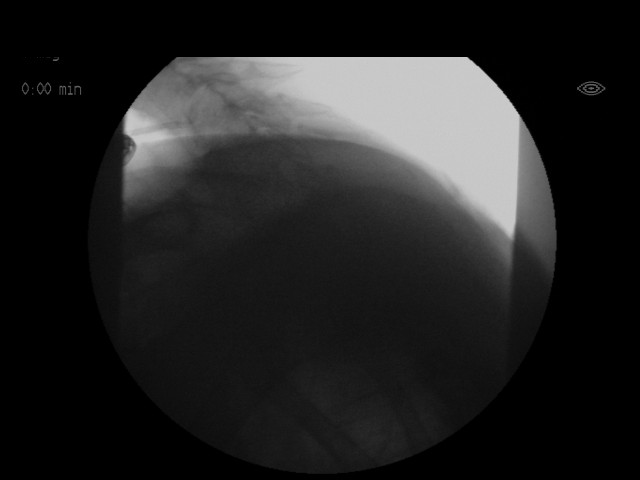
[im 5/48]
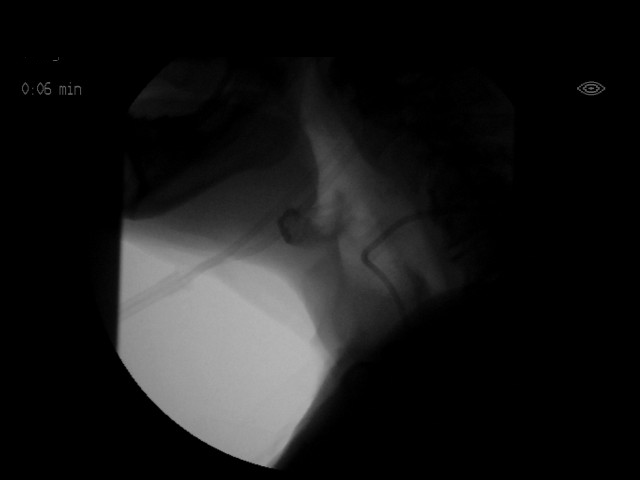
[im 8/48]
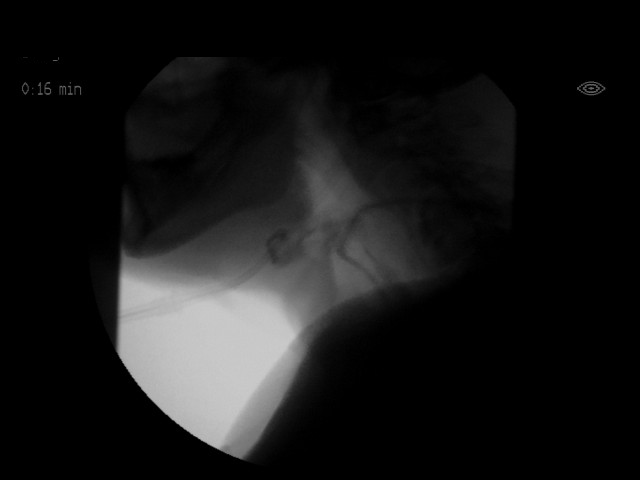
[im 10/48]
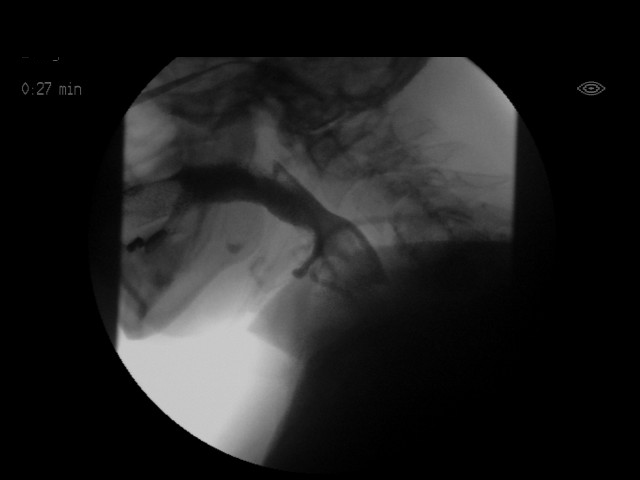
[im 15/48]
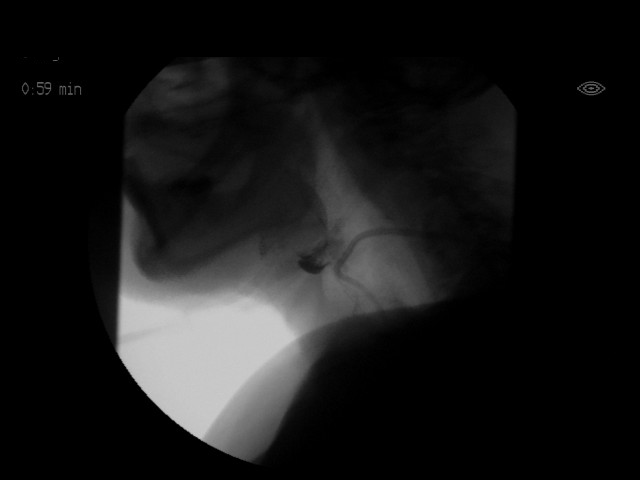
[im 17/48]
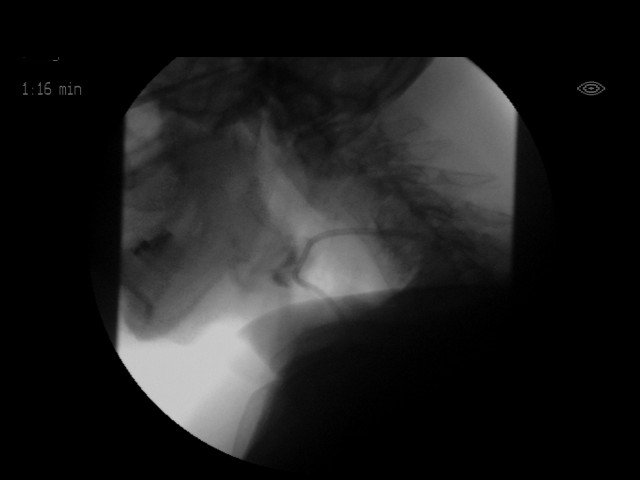
[im 19/48]
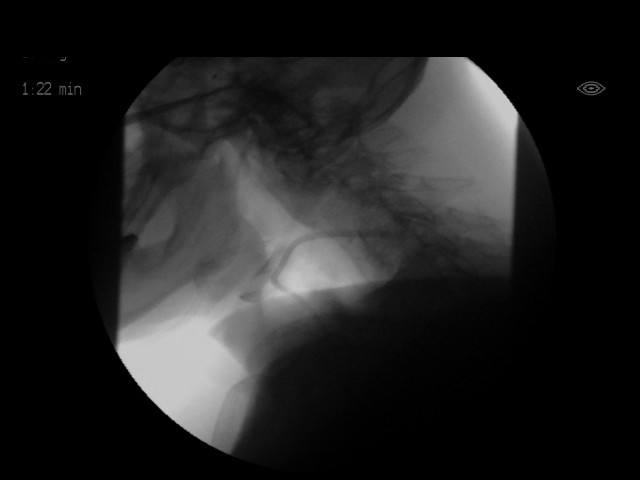
[im 24/48]
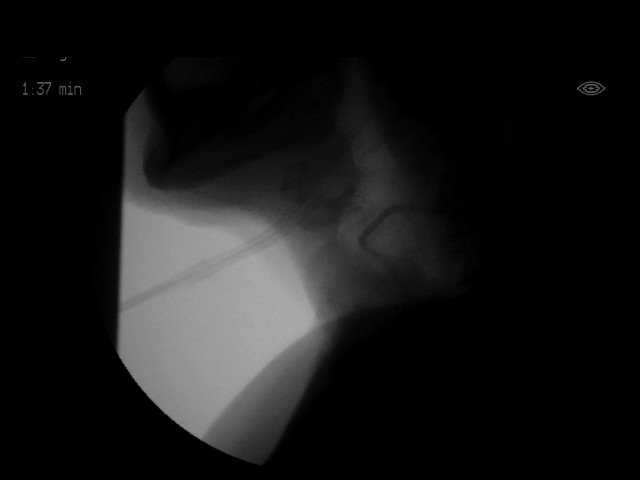
[im 26/48]
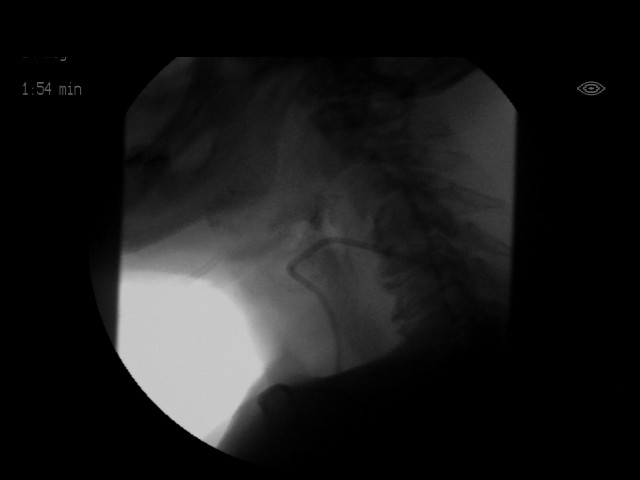
[im 29/48]
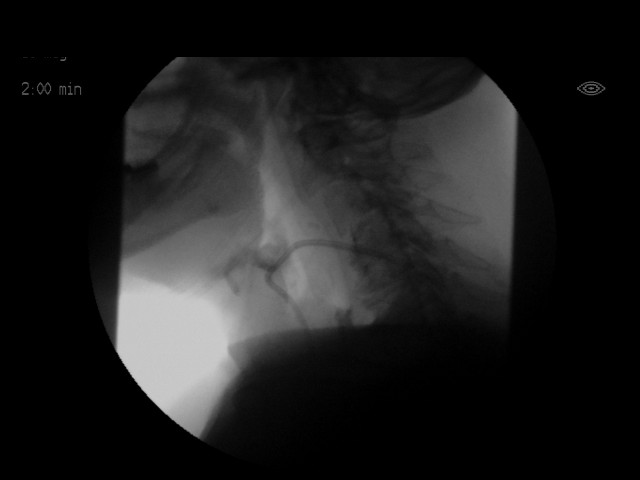
[im 33/48]
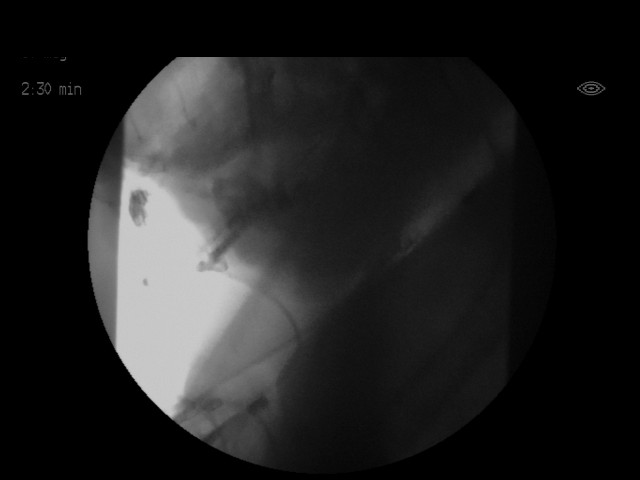
[im 36/48]
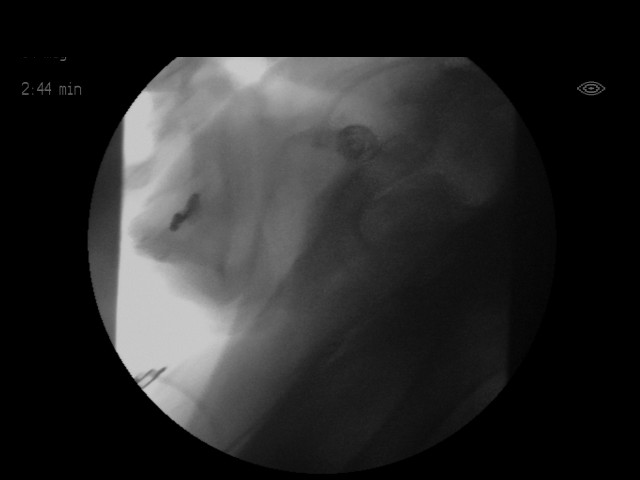
[im 38/48]
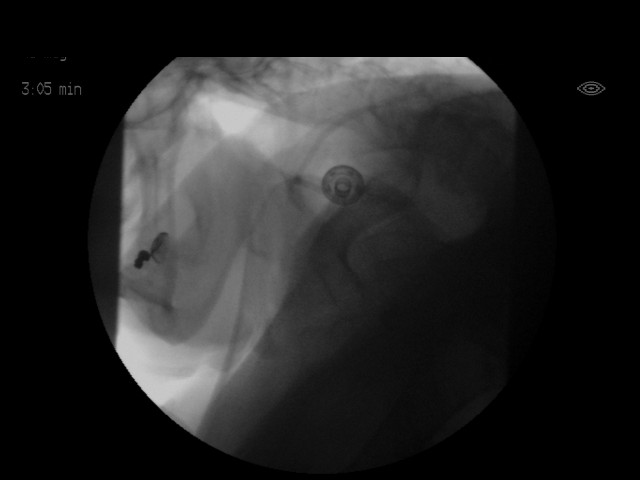
[im 43/48]
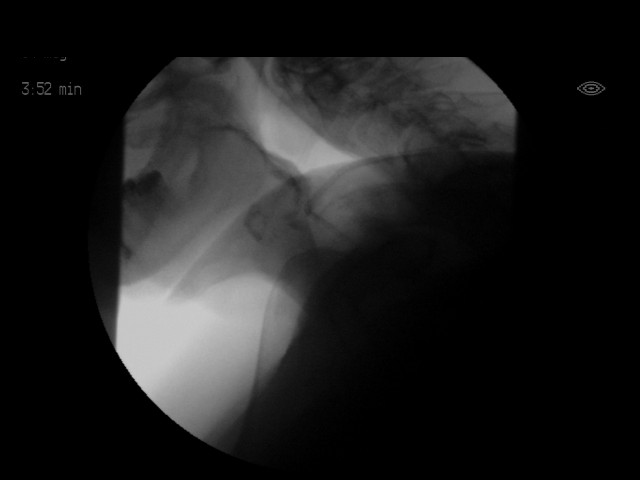
[im 45/48]
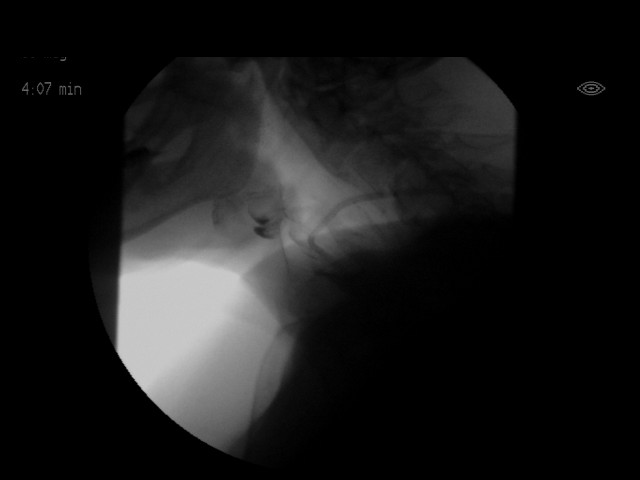
[im 48/48]
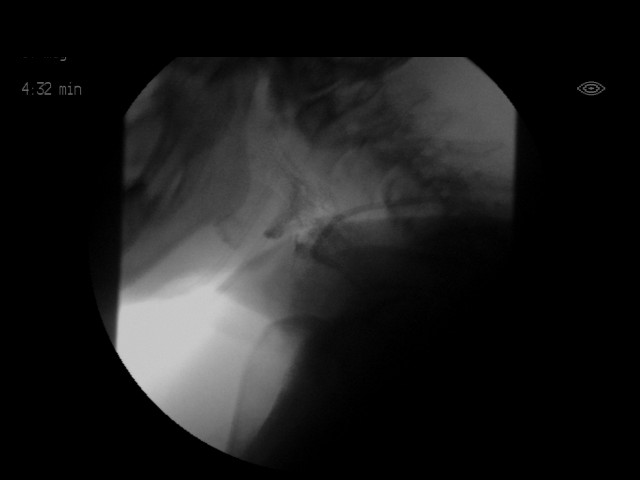

[Series 2: run · 8 acquisitions, 2 frames shown (2 of 2)]
[im 4/8]
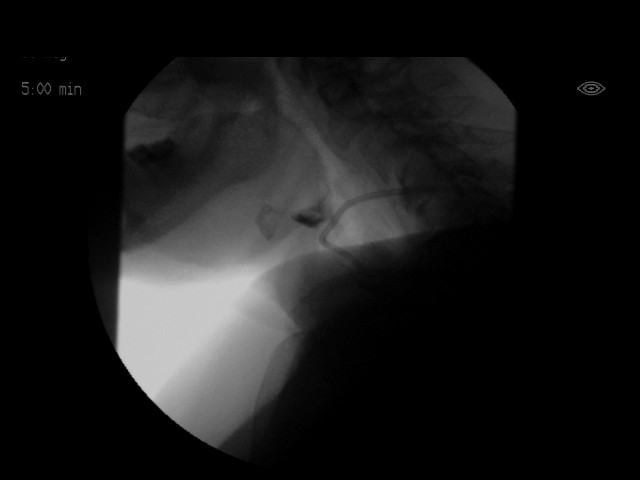
[im 8/8]
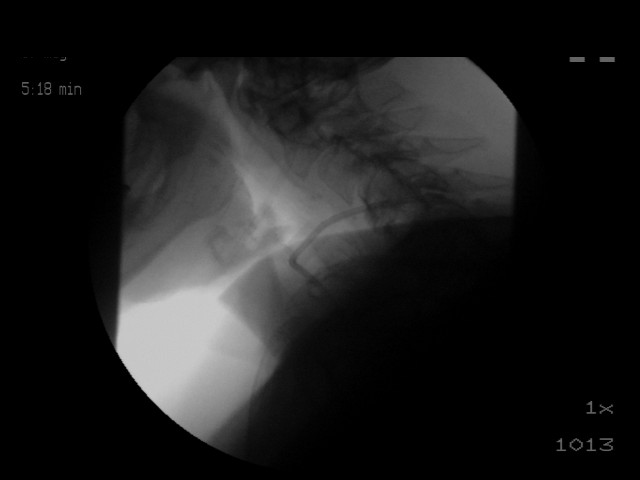

[18 of 24 positions shown; findings below may reference images not displayed]

FLUOROSCOPY FOR SWALLOWING FUNCTION STUDY:
Fluoroscopy was provided for swallowing function study, which was administered by a speech pathologist.  Final results and recommendations from this study are contained within the speech pathology report.

## 2017-11-05 ENCOUNTER — Ambulatory Visit: Payer: Medicare Other
